# Patient Record
Sex: Male | Born: 1950
Health system: Southern US, Community
[De-identification: ages and names within clinical notes are randomized; demographics above are authoritative.]

## PROBLEM LIST (undated history)

## (undated) DIAGNOSIS — I5033 Acute on chronic diastolic (congestive) heart failure: Secondary | ICD-10-CM

## (undated) DIAGNOSIS — N189 Chronic kidney disease, unspecified: Secondary | ICD-10-CM

## (undated) DIAGNOSIS — G4730 Sleep apnea, unspecified: Secondary | ICD-10-CM

## (undated) DIAGNOSIS — Z1379 Encounter for other screening for genetic and chromosomal anomalies: Principal | ICD-10-CM

## (undated) DIAGNOSIS — I82409 Acute embolism and thrombosis of unspecified deep veins of unspecified lower extremity: Secondary | ICD-10-CM

## (undated) DIAGNOSIS — C801 Malignant (primary) neoplasm, unspecified: Secondary | ICD-10-CM

## (undated) DIAGNOSIS — I2699 Other pulmonary embolism without acute cor pulmonale: Secondary | ICD-10-CM

## (undated) DIAGNOSIS — M1990 Unspecified osteoarthritis, unspecified site: Secondary | ICD-10-CM

## (undated) DIAGNOSIS — E1140 Type 2 diabetes mellitus with diabetic neuropathy, unspecified: Secondary | ICD-10-CM

## (undated) DIAGNOSIS — J189 Pneumonia, unspecified organism: Secondary | ICD-10-CM

## (undated) DIAGNOSIS — I10 Essential (primary) hypertension: Secondary | ICD-10-CM

## (undated) DIAGNOSIS — K219 Gastro-esophageal reflux disease without esophagitis: Secondary | ICD-10-CM

## (undated) HISTORY — PX: HERNIA REPAIR: SHX51

## (undated) HISTORY — PX: OTHER SURGICAL HISTORY: SHX169

## (undated) HISTORY — DX: Malignant (primary) neoplasm, unspecified: C80.1

## (undated) HISTORY — PX: KNEE SURGERY: SHX244

## (undated) HISTORY — PX: COLONOSCOPY: SHX174

## (undated) HISTORY — DX: Encounter for other screening for genetic and chromosomal anomalies: Z13.79

## (undated) HISTORY — DX: Chronic kidney disease, unspecified: N18.9

## (undated) HISTORY — PX: SHOULDER SURGERY: SHX246

## (undated) HISTORY — PX: PROSTATE SURGERY: SHX751

## (undated) HISTORY — PX: MULTIPLE TOOTH EXTRACTIONS: SHX2053

## (undated) HISTORY — PX: RADIOACTIVE SEED IMPLANT: SHX5150

---

## 2000-01-06 ENCOUNTER — Encounter: Payer: Self-pay | Admitting: Emergency Medicine

## 2000-01-06 ENCOUNTER — Inpatient Hospital Stay (HOSPITAL_COMMUNITY): Admission: EM | Admit: 2000-01-06 | Discharge: 2000-01-07 | Payer: Self-pay | Admitting: Emergency Medicine

## 2000-01-07 ENCOUNTER — Encounter: Payer: Self-pay | Admitting: Internal Medicine

## 2003-06-06 ENCOUNTER — Encounter: Admission: RE | Admit: 2003-06-06 | Discharge: 2003-06-06 | Payer: Self-pay | Admitting: Internal Medicine

## 2003-09-20 ENCOUNTER — Encounter (INDEPENDENT_AMBULATORY_CARE_PROVIDER_SITE_OTHER): Payer: Self-pay | Admitting: Specialist

## 2003-09-20 ENCOUNTER — Ambulatory Visit (HOSPITAL_COMMUNITY): Admission: RE | Admit: 2003-09-20 | Discharge: 2003-09-20 | Payer: Self-pay | Admitting: Gastroenterology

## 2003-12-21 ENCOUNTER — Encounter: Admission: RE | Admit: 2003-12-21 | Discharge: 2003-12-21 | Payer: Self-pay | Admitting: Internal Medicine

## 2005-07-17 ENCOUNTER — Encounter: Admission: RE | Admit: 2005-07-17 | Discharge: 2005-07-17 | Payer: Self-pay | Admitting: Internal Medicine

## 2005-10-27 ENCOUNTER — Emergency Department (HOSPITAL_COMMUNITY): Admission: EM | Admit: 2005-10-27 | Discharge: 2005-10-27 | Payer: Self-pay | Admitting: Emergency Medicine

## 2005-11-06 ENCOUNTER — Ambulatory Visit: Admission: RE | Admit: 2005-11-06 | Discharge: 2006-01-26 | Payer: Self-pay | Admitting: Radiation Oncology

## 2006-08-19 ENCOUNTER — Encounter: Admission: RE | Admit: 2006-08-19 | Discharge: 2006-08-19 | Payer: Self-pay | Admitting: Internal Medicine

## 2006-09-30 ENCOUNTER — Inpatient Hospital Stay (HOSPITAL_BASED_OUTPATIENT_CLINIC_OR_DEPARTMENT_OTHER): Admission: RE | Admit: 2006-09-30 | Discharge: 2006-09-30 | Payer: Self-pay | Admitting: Interventional Cardiology

## 2007-12-02 ENCOUNTER — Ambulatory Visit: Payer: Self-pay | Admitting: Hematology & Oncology

## 2008-01-13 LAB — COMPREHENSIVE METABOLIC PANEL
ALT: 15 U/L (ref 0–53)
AST: 10 U/L (ref 0–37)
Albumin: 3.9 g/dL (ref 3.5–5.2)
Alkaline Phosphatase: 88 U/L (ref 39–117)
BUN: 13 mg/dL (ref 6–23)
CO2: 27 mEq/L (ref 19–32)
Calcium: 9.1 mg/dL (ref 8.4–10.5)
Chloride: 101 mEq/L (ref 96–112)
Creatinine, Ser: 0.87 mg/dL (ref 0.40–1.50)
Glucose, Bld: 291 mg/dL — ABNORMAL HIGH (ref 70–99)
Potassium: 4.2 mEq/L (ref 3.5–5.3)
Sodium: 138 mEq/L (ref 135–145)
Total Bilirubin: 0.4 mg/dL (ref 0.3–1.2)
Total Protein: 7.3 g/dL (ref 6.0–8.3)

## 2008-01-13 LAB — CBC WITH DIFFERENTIAL (CANCER CENTER ONLY)
BASO#: 0.1 10*3/uL (ref 0.0–0.2)
BASO%: 0.7 % (ref 0.0–2.0)
EOS%: 1.2 % (ref 0.0–7.0)
Eosinophils Absolute: 0.1 10*3/uL (ref 0.0–0.5)
HCT: 40 % (ref 38.7–49.9)
HGB: 13.4 g/dL (ref 13.0–17.1)
LYMPH#: 2.3 10*3/uL (ref 0.9–3.3)
LYMPH%: 26.9 % (ref 14.0–48.0)
MCH: 27.9 pg — ABNORMAL LOW (ref 28.0–33.4)
MCHC: 33.5 g/dL (ref 32.0–35.9)
MCV: 83 fL (ref 82–98)
MONO#: 0.4 10*3/uL (ref 0.1–0.9)
MONO%: 5.2 % (ref 0.0–13.0)
NEUT#: 5.6 10*3/uL (ref 1.5–6.5)
NEUT%: 66 % (ref 40.0–80.0)
Platelets: 252 10*3/uL (ref 145–400)
RBC: 4.8 10*6/uL (ref 4.20–5.70)
RDW: 12.1 % (ref 10.5–14.6)
WBC: 8.4 10*3/uL (ref 4.0–10.0)

## 2008-01-13 LAB — PSA: PSA: 8.22 ng/mL — ABNORMAL HIGH (ref 0.10–4.00)

## 2008-01-13 LAB — LACTATE DEHYDROGENASE: LDH: 155 U/L (ref 94–250)

## 2008-01-19 ENCOUNTER — Ambulatory Visit (HOSPITAL_COMMUNITY): Admission: RE | Admit: 2008-01-19 | Discharge: 2008-01-19 | Payer: Self-pay | Admitting: Hematology & Oncology

## 2008-01-30 ENCOUNTER — Ambulatory Visit: Payer: Self-pay | Admitting: Hematology & Oncology

## 2008-02-06 ENCOUNTER — Ambulatory Visit: Admission: RE | Admit: 2008-02-06 | Discharge: 2008-02-16 | Payer: Self-pay | Admitting: Radiation Oncology

## 2008-02-17 ENCOUNTER — Ambulatory Visit: Admission: RE | Admit: 2008-02-17 | Discharge: 2008-05-17 | Payer: Self-pay | Admitting: Radiation Oncology

## 2008-02-17 DIAGNOSIS — C801 Malignant (primary) neoplasm, unspecified: Secondary | ICD-10-CM

## 2008-02-17 HISTORY — DX: Malignant (primary) neoplasm, unspecified: C80.1

## 2008-04-23 ENCOUNTER — Ambulatory Visit (HOSPITAL_COMMUNITY): Admission: RE | Admit: 2008-04-23 | Discharge: 2008-04-23 | Payer: Self-pay | Admitting: Urology

## 2008-05-29 ENCOUNTER — Ambulatory Visit: Admission: RE | Admit: 2008-05-29 | Discharge: 2008-07-10 | Payer: Self-pay | Admitting: Radiation Oncology

## 2008-08-22 ENCOUNTER — Ambulatory Visit: Payer: Self-pay | Admitting: Sports Medicine

## 2008-08-22 DIAGNOSIS — IMO0002 Reserved for concepts with insufficient information to code with codable children: Secondary | ICD-10-CM | POA: Insufficient documentation

## 2008-08-22 DIAGNOSIS — M1710 Unilateral primary osteoarthritis, unspecified knee: Secondary | ICD-10-CM

## 2008-08-22 DIAGNOSIS — M76899 Other specified enthesopathies of unspecified lower limb, excluding foot: Secondary | ICD-10-CM | POA: Insufficient documentation

## 2008-09-19 ENCOUNTER — Ambulatory Visit: Payer: Self-pay | Admitting: Sports Medicine

## 2008-09-19 DIAGNOSIS — E119 Type 2 diabetes mellitus without complications: Secondary | ICD-10-CM | POA: Insufficient documentation

## 2009-02-02 ENCOUNTER — Emergency Department (HOSPITAL_COMMUNITY): Admission: EM | Admit: 2009-02-02 | Discharge: 2009-02-02 | Payer: Self-pay | Admitting: Emergency Medicine

## 2009-07-01 ENCOUNTER — Emergency Department (HOSPITAL_COMMUNITY): Admission: EM | Admit: 2009-07-01 | Discharge: 2009-07-01 | Payer: Self-pay | Admitting: Emergency Medicine

## 2009-07-21 ENCOUNTER — Emergency Department (HOSPITAL_COMMUNITY): Admission: EM | Admit: 2009-07-21 | Discharge: 2009-07-21 | Payer: Self-pay | Admitting: Emergency Medicine

## 2009-10-15 ENCOUNTER — Ambulatory Visit: Payer: Self-pay | Admitting: Sports Medicine

## 2009-10-15 ENCOUNTER — Ambulatory Visit (HOSPITAL_COMMUNITY)
Admission: RE | Admit: 2009-10-15 | Discharge: 2009-10-15 | Payer: Self-pay | Source: Home / Self Care | Admitting: Family Medicine

## 2009-10-15 ENCOUNTER — Encounter: Payer: Self-pay | Admitting: Family Medicine

## 2009-10-15 DIAGNOSIS — M25569 Pain in unspecified knee: Secondary | ICD-10-CM | POA: Insufficient documentation

## 2009-10-18 ENCOUNTER — Telehealth: Payer: Self-pay | Admitting: Family Medicine

## 2009-12-06 ENCOUNTER — Emergency Department (HOSPITAL_COMMUNITY): Admission: EM | Admit: 2009-12-06 | Discharge: 2009-12-06 | Payer: Self-pay | Admitting: Emergency Medicine

## 2010-01-28 ENCOUNTER — Encounter: Payer: Self-pay | Admitting: Sports Medicine

## 2010-02-28 ENCOUNTER — Encounter: Payer: Self-pay | Admitting: Family Medicine

## 2010-03-20 NOTE — Progress Notes (Signed)
Summary: Phone note - knee x-ray review  Phone Note Outgoing Call   Call placed by: Darene Lamer, DO Call placed to: Patient Reason for Call: Discuss lab or test results Summary of Call: Spoke to pt on phone 10/18/09 @ 17:00.  Called pt with recent knee x-ray results.  Pt has advanced b/l tri-compartmental DJD.  He continues to have significant pain.  Again discussed option of knee injections, but feel it may be worthwhile for him to f/u with his orthopedist regarding possibility of knee replacement.  He has not done well with oral therapy at this point either.  Pt plans to f/u with ortho for their input.  He is more than welcome to come to [place] for injections if he needed.  Encouraged to call with any questions or concerns.  Pt expressed understanding & agreement with above.

## 2010-03-20 NOTE — Assessment & Plan Note (Signed)
Summary: FU KNEE/JW   Vital Signs:  Patient profile:   60 year old male BP sitting:   144 / 80  Vitals Entered By: Lillia Pauls CMA (September 19, 2008 3:57 PM)  History of Present Illness: reports to f/u chronic left knee pain 2/2 severe DJD. Received corticosteroid injection during last office visit. Using unloader brace for ambulation. Avg pain level decreased from 9/10 to 7/10. Some intermittent left knee buckling. Persistent left knee swelling. No catching or locking of the knee.   gave way yesterday going up steps. swollen more since then.  Physical Exam  General:  significant obesity; ,in no acute distress; alert,appropriate and cooperative throughout examination Msk:  Hips: 5/5 strength on flexion/abd/ER on the hips.  Knees: Genu Valgum (L>R). 100 degrees left flexion. 130 degrees right flexion. -10 degrees left extension. -5 degrees right extension. 5/5 strength throughout ROM testing. Mild inferior knee swelling, most notable over the pes anserine bursa. Negative McMurray's. No increased ligament laxity.  Feet: Excessive pronation (L>R). Toe splaying on the left.  *Significantly decreased limp with insertion of left insole with medial post and scaphoid arch support.   Impression & Recommendations:  Problem # 1:  DEGENERATIVE JOINT DISEASE, BOTH KNEES, SEVERE (ICD-715.96)  - Comforthotics with medial post and scaphoid arch support provided for use in the left shoe. - Continue to use unloader brace for ambulatory activities. - Tramadol prescribed. - RTC in 2 months or sooner as needed.  His updated medication list for this problem includes:    Aspirin 81 Mg Chew (Aspirin) .Marland Kitchen... 1 by mouth qd    Tramadol Hcl 50 Mg Tabs (Tramadol hcl) .Marland Kitchen... 1 by mouth qid  Orders: Sports Insoles (L3510)  Problem # 2:  BURSITIS, LEFT KNEE (ICD-726.60)  - Per item #1  injection not that helpful prob 2/2 degree of DJD and obesity  Orders: Sports Insoles (Z6109)  Problem # 3:   DIABETES MELLITUS, TYPE II (ICD-250.00)  His updated medication list for this problem includes:    Metformin Hcl 500 Mg Tabs (Metformin hcl) .Marland Kitchen... 1 by mouth bid    Lisinopril 20 Mg Tabs (Lisinopril) .Marland Kitchen... 1 by mouth qd    Aspirin 81 Mg Chew (Aspirin) .Marland Kitchen... 1 by mouth once daily  I think he will need chronic DJD medicines However, would prefer using tramadol to NSAIDs with his hx of DM  Orders: Sports Insoles (L3510)  Complete Medication List: 1)  Metformin Hcl 500 Mg Tabs (Metformin hcl) .Marland Kitchen.. 1 by mouth bid 2)  Lisinopril 20 Mg Tabs (Lisinopril) .Marland Kitchen.. 1 by mouth qd 3)  Aspirin 81 Mg Chew (Aspirin) .Marland Kitchen.. 1 by mouth qd 4)  Tramadol Hcl 50 Mg Tabs (Tramadol hcl) .Marland Kitchen.. 1 by mouth qid Prescriptions: TRAMADOL HCL 50 MG TABS (TRAMADOL HCL) 1 by mouth qid  #120 x 3   Entered by:   Lillia Pauls CMA   Authorized by:   Enid Baas MD   Signed by:   Lillia Pauls CMA on 09/19/2008   Method used:   Electronically to        [facility]* (retail)       [id number]       [address] Shipping/mailing       [place], [place]  [address]       Ph: [phone]       Fax: [phone]   RxID:   [id number]

## 2010-03-20 NOTE — Assessment & Plan Note (Signed)
Summary: BILATERAL KNEE PAIN,MC   History of Present Illness: 60yo male to office with c/o b/l knee pain L>R x 3-years. Pt with known DJD of knees & reportedly told by orthopedist several years ago should consider knee replacement. Experiencing increased pain in knees x several months. Pain worse with standing for long periods of time. Occasional swelling.  (+)clicking, no locking or catching. Denies recent trauma or injury. Using unloader brace periodically with some improvement. Hx of steroid injections - last over 1-yr ago.  Never discussed visco-supplementation. Taking tramadol 3-4 times daily with some small improvement in symptoms. No previous x-rays available for review.  PMH: Prostate CA with radioactive seed implant, needs to self cath due to complications; DM-2 with neuropathy PSH: Lt ACL repair - 1982, R knee arthrscopy - around 1982 w/ signs of arthritis.  Past History:  Past Medical History: Prostate CA with radioactive seed implant - needs to self cath secondary to complications DM-2 with peripheral neuropathy  Past Surgical History: Left ACL repair 1982 Right knee arthroscopy - around 1982, showed signs of arthritis  Review of Systems      See HPI General:  Denies chills, fatigue, fever, sleep disorder, and weakness. GU:  needs to self cath. MS:  See HPI. Derm:  Denies changes in color of skin, changes in nail beds, flushing, itching, lesion(s), poor wound healing, and rash. Neuro:  Complains of numbness and tingling; denies brief paralysis, falling down, poor balance, tremors, and weakness.  Physical Exam  General:  Morbidly obese, in no acute distress; alert,appropriate and cooperative throughout examination Msk:  KNEES:  noted to have genu valgum (L>R). - L knee: ROM -5-10 degrees extension, 110 degrees flexion. 2+ crepitus.  Mild amount of inferior-lateral swelling, no effusion.  (+)TTP over medial & lateral joint lines.  No ligamentous laxity, neg  McMurray. - R knee: ROM -5 degrees extension, 120 flexion with 2+ crepitus.  No swelling or effusion. (+) TTP along medial & lateral joint lines.  No ligamentous laxity, neg McMurray.  Feet: Excessive pronation (L>R). Toe splaying on the left. Pulses:  +1/4 DP & PT b/l Neurologic:  sensation intact to light touch.     Impression & Recommendations:  Problem # 1:  DEGENERATIVE JOINT DISEASE, BOTH KNEES, SEVERE (ICD-715.96) - Hx of severe knee DJD, now with increasing symptoms.  No images available for review, but previous notes indicate severe arthritis. - Discussed various tx options, including further oral medications, steroid injections, viscosupplementation, total knee replacement.  Pt not interested in injections at this time & would like to try other options. - Will check b/l knee x-rays to evaluate degree of arthritis.  Will contact with results at [phone]. - Recommended use of topical Capsaicin 3-4 times daily as needed. - Reviewed knee exercises. - Cont. to use unloader brace & keep sports insoles in shoes. - will discuss f/u with pt after receiving x-ray results.  His updated medication list for this problem includes:    Aspirin 81 Mg Chew (Aspirin) .Marland Kitchen... 1 by mouth qd    Tramadol Hcl 50 Mg Tabs (Tramadol hcl) .Marland Kitchen... 1 by mouth qid  Problem # 2:  KNEE PAIN, BILATERAL (ICD-719.46) - Secondary to severe DJD - will likely need knee replacement  His updated medication list for this problem includes:    Aspirin 81 Mg Chew (Aspirin) .Marland Kitchen... 1 by mouth qd    Tramadol Hcl 50 Mg Tabs (Tramadol hcl) .Marland Kitchen... 1 by mouth qid  Problem # 3:  MORBID OBESITY (ICD-278.01) - Contributing  to knee pain.  Would benefit from weight loss  Complete Medication List: 1)  Metformin Hcl 500 Mg Tabs (Metformin hcl) .Marland Kitchen.. 1 by mouth bid 2)  Lisinopril 20 Mg Tabs (Lisinopril) .Marland Kitchen.. 1 by mouth qd 3)  Aspirin 81 Mg Chew (Aspirin) .Marland Kitchen.. 1 by mouth qd 4)  Tramadol Hcl 50 Mg Tabs (Tramadol hcl) .Marland Kitchen.. 1 by mouth  qid  Other Orders: Radiology other (Radiology Other)

## 2010-03-20 NOTE — Assessment & Plan Note (Signed)
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; open reconstruction and patellar tendon replacement His L-knee has held up nicely since the surgery  Hit his knee the corner of a desk, just lateral to the tibial tubercle, approximately 1 month ago Swelling and discomfort in knee since this time Immediate swelling; no locking, catching, or clicking  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  was riding bike whcih helped knee flexibility  recently not doing much 2/2 knee pain  Dr Chaney Malling had advised him that knee is ready for TKR  Physical Exam  General:  morbidly obese NAD pleasant Msk:  L-knne, Scar runs along medial aspect of knee from suprapatellar region to below the L-tibial tubercle 2nd surgical scar over medial hamstring area 3rd scar on lateral joint line extending from lateral hamstring down iliotibial band almost to the tibial tubercle   On exam L-knee is warm compared to R-knee Swelling at the infrapatellar bursa on the L-knee Fluid in the suprapatellar pouch of L-knee Also, swelling on L-knee lateral joint line Signs of chronic DJD  L-knee, Anterior Drawer is ok Seated McMurray's is ok Flexion contracture in L-knee of approximately 15 degrees  Lacks 30 degrees of full flexion on R-knee Lacks 60 degrees of full flexion on L-knee   Additional Exam:  quick scan of knee iwth [facility] [place] has markedly thickened quad tendon lots of DJD and old operative changes noted patellar tendon intact at distal insetion there is moderate increase bursal fluid at lateral area there is a pocket of bursal or pseudobursal swelling lat to pat tendon and just below  joint line   Impression & Recommendations:  Problem # 1:  BURSITIS, LEFT KNEE (ICD-726.60)  swelling is significant today   cleanse with alcohol Topical analgesic spray : Ethyl choride Joint left knee at infrapatellar bursa Approached in typical fashion with: lateral approach directed under patellar tendon Completed without difficulty Meds: 1cc kenalog 40 + 3 ccs marcaine Needle: 25 G and 1.5 inch Aftercare instructions and Red flags advised  rest x 3 days then use donjoy knee sleeve for compression reck 1 month  Orders: Joint Aspirate / Injection, Intermediate (20605) Knee Support Pat cutout (Z6109)  Problem # 2:  DEGENERATIVE JOINT DISEASE, BOTH KNEES, SEVERE (ICD-715.96)  this is sever on left iwth lots of post op DJD also has at least 15 deg flexion contracture this cause relative short leg and trendelenburg on gait given heel lift on left and this improves gait somewhat  try to resume biking obvious candidate for TKR but depens on sxs which were not bad prior to this injury  Orders: Joint Aspirate / Injection, Intermediate ([address]) Knee Support Pat cutout (U9811)  Problem # 3:  MORBID OBESITY (ICD-278.01) try to resume exercise program when knee improving

## 2010-03-20 NOTE — Letter (Signed)
Summary: Disability determination services  Disability determination services   Imported By: Marily Memos 01/30/2010 15:13:52  _____________________________________________________________________  External Attachment:    Type:   Image     Comment:   External Document

## 2010-03-20 NOTE — Letter (Signed)
Summary: Disability determination services  Disability determination services   Imported By: Marily Memos 03/05/2010 10:44:05  _____________________________________________________________________  External Attachment:    Type:   Image     Comment:   External Document

## 2010-04-30 LAB — COMPREHENSIVE METABOLIC PANEL
ALT: 32 U/L (ref 0–53)
AST: 24 U/L (ref 0–37)
Albumin: 3.3 g/dL — ABNORMAL LOW (ref 3.5–5.2)
Alkaline Phosphatase: 70 U/L (ref 39–117)
BUN: 10 mg/dL (ref 6–23)
CO2: 25 mEq/L (ref 19–32)
Calcium: 8.2 mg/dL — ABNORMAL LOW (ref 8.4–10.5)
Chloride: 111 mEq/L (ref 96–112)
Creatinine, Ser: 1.15 mg/dL (ref 0.4–1.5)
GFR calc Af Amer: >60 mL/min (ref >60)
GFR calc non Af Amer: >60 mL/min (ref >60)
Glucose, Bld: 232 mg/dL — ABNORMAL HIGH (ref 70–99)
Potassium: 3.2 mEq/L — ABNORMAL LOW (ref 3.5–5.1)
Sodium: 142 mEq/L (ref 135–145)
Total Bilirubin: 1 mg/dL (ref 0.3–1.2)
Total Protein: 7.2 g/dL (ref 6.0–8.3)

## 2010-04-30 LAB — CBC
HCT: 34.2 % — ABNORMAL LOW (ref 39.0–52.0)
Hemoglobin: 10.8 g/dL — ABNORMAL LOW (ref 13.0–17.0)
MCH: 26.5 pg (ref 26.0–34.0)
MCHC: 31.6 g/dL (ref 30.0–36.0)
MCV: 84 fL (ref 78.0–100.0)
Platelets: 290 10*3/uL (ref 150–400)
RBC: 4.07 MIL/uL — ABNORMAL LOW (ref 4.22–5.81)
RDW: 14.5 % (ref 11.5–15.5)
WBC: 8 10*3/uL (ref 4.0–10.5)

## 2010-04-30 LAB — DIFFERENTIAL
Basophils Absolute: 0 10*3/uL (ref 0.0–0.1)
Basophils Relative: 0 % (ref 0–1)
Eosinophils Absolute: 0 10*3/uL (ref 0.0–0.7)
Eosinophils Relative: 0 % (ref 0–5)
Lymphocytes Relative: 16 % (ref 12–46)
Lymphs Abs: 1.3 10*3/uL (ref 0.7–4.0)
Monocytes Absolute: 0.4 10*3/uL (ref 0.1–1.0)
Monocytes Relative: 6 % (ref 3–12)
Neutro Abs: 6.2 10*3/uL (ref 1.7–7.7)
Neutrophils Relative %: 78 % — ABNORMAL HIGH (ref 43–77)

## 2010-04-30 LAB — CK TOTAL AND CKMB (NOT AT ARMC)
CK, MB: 0.9 ng/mL (ref 0.3–4.0)
Relative Index: 0.8 (ref 0.0–2.5)
Total CK: 107 U/L (ref 7–232)

## 2010-04-30 LAB — TROPONIN I: Troponin I: 0.01 ng/mL (ref 0.00–0.06)

## 2010-04-30 LAB — BRAIN NATRIURETIC PEPTIDE: Pro B Natriuretic peptide (BNP): 362 pg/mL — ABNORMAL HIGH (ref 0.0–100.0)

## 2010-04-30 LAB — D-DIMER, QUANTITATIVE: D-Dimer, Quant: 2.33 ug/mL-FEU — ABNORMAL HIGH (ref 0.00–0.48)

## 2010-05-05 LAB — URINALYSIS, ROUTINE W REFLEX MICROSCOPIC
Bilirubin Urine: NEGATIVE
Glucose, UA: NEGATIVE mg/dL
Ketones, ur: NEGATIVE mg/dL
Nitrite: NEGATIVE
Protein, ur: NEGATIVE mg/dL
Specific Gravity, Urine: 1.015 (ref 1.005–1.030)
Urobilinogen, UA: 0.2 mg/dL (ref 0.0–1.0)
pH: 5 (ref 5.0–8.0)

## 2010-05-05 LAB — URINE CULTURE
Colony Count: 5000
Colony Count: >=100000

## 2010-05-05 LAB — URINE MICROSCOPIC-ADD ON

## 2010-05-19 LAB — POCT URINALYSIS DIP (DEVICE)
Bilirubin Urine: NEGATIVE
Glucose, UA: >=1000 mg/dL — AB
Nitrite: NEGATIVE
Protein, ur: >=300 mg/dL — AB
Specific Gravity, Urine: 1.015 (ref 1.005–1.030)
Urobilinogen, UA: 0.2 mg/dL (ref 0.0–1.0)
pH: 6 (ref 5.0–8.0)

## 2010-05-19 LAB — CBC
HCT: 37.6 % — ABNORMAL LOW (ref 39.0–52.0)
Hemoglobin: 12.5 g/dL — ABNORMAL LOW (ref 13.0–17.0)
MCHC: 33.3 g/dL (ref 30.0–36.0)
MCV: 85 fL (ref 78.0–100.0)
Platelets: 259 10*3/uL (ref 150–400)
RBC: 4.43 MIL/uL (ref 4.22–5.81)
RDW: 14.2 % (ref 11.5–15.5)
WBC: 11.4 10*3/uL — ABNORMAL HIGH (ref 4.0–10.5)

## 2010-05-19 LAB — URINE CULTURE: Colony Count: 40000

## 2010-05-19 LAB — DIFFERENTIAL
Basophils Absolute: 0 10*3/uL (ref 0.0–0.1)
Basophils Relative: 0 % (ref 0–1)
Eosinophils Absolute: 0.1 10*3/uL (ref 0.0–0.7)
Eosinophils Relative: 1 % (ref 0–5)
Lymphocytes Relative: 14 % (ref 12–46)
Lymphs Abs: 1.6 10*3/uL (ref 0.7–4.0)
Monocytes Absolute: 0.6 10*3/uL (ref 0.1–1.0)
Monocytes Relative: 6 % (ref 3–12)
Neutro Abs: 9 10*3/uL — ABNORMAL HIGH (ref 1.7–7.7)
Neutrophils Relative %: 80 % — ABNORMAL HIGH (ref 43–77)

## 2010-05-29 LAB — BASIC METABOLIC PANEL
BUN: 7 mg/dL (ref 6–23)
CO2: 32 mEq/L (ref 19–32)
Calcium: 8.7 mg/dL (ref 8.4–10.5)
Chloride: 98 mEq/L (ref 96–112)
Creatinine, Ser: 1.09 mg/dL (ref 0.4–1.5)
GFR calc Af Amer: >60 mL/min (ref >60)
GFR calc non Af Amer: >60 mL/min (ref >60)
Glucose, Bld: 228 mg/dL — ABNORMAL HIGH (ref 70–99)
Potassium: 3.9 mEq/L (ref 3.5–5.1)
Sodium: 139 mEq/L (ref 135–145)

## 2010-05-29 LAB — GLUCOSE, CAPILLARY
Glucose-Capillary: 222 mg/dL — ABNORMAL HIGH (ref 70–99)
Glucose-Capillary: 265 mg/dL — ABNORMAL HIGH (ref 70–99)
Glucose-Capillary: 310 mg/dL — ABNORMAL HIGH (ref 70–99)

## 2010-05-29 LAB — HEMOGLOBIN AND HEMATOCRIT, BLOOD
HCT: 40.2 % (ref 39.0–52.0)
Hemoglobin: 13.2 g/dL (ref 13.0–17.0)

## 2010-07-01 NOTE — Cardiovascular Report (Signed)
Thomas [name], Thomas [name] NO.:  [id number]   MEDICAL RECORD NO.:  [id number]          PATIENT TYPE:  OIB   LOCATION:  1961                         FACILITY:  [facility]   PHYSICIAN:  Corky Crafts, MDDATE OF BIRTH:  1950/12/14   DATE OF PROCEDURE:  09/30/2006  DATE OF DISCHARGE:                            CARDIAC CATHETERIZATION   PRIMARY OPERATOR:  Jake Bathe, MD and. Corky Crafts, MD   INDICATIONS:  Abnormal LV function by nuclear study and persistent chest  pain.  The patient will be undergoing surgery.   PROCEDURE PERFORMED:  1. Left heart catheterization.  2. Left ventriculogram/  3. Coronary angiogram.  4. Abdominal aortogram.   PROCEDURE NARRATIVE:  The risks and benefits of cardiac catheterization  were explained to the patient and informed consent was obtained.  The  patient was brought to the cath lab.  He was prepped and draped in the  usual sterile fashion.  His right groin was infiltrated with 1%  lidocaine.  A 4-French arterial sheath was placed into his right femoral  artery using the modified Seldinger technique.  Left coronary artery  angiography was performed using a JL-4 catheter.  The catheter was  advanced to the vessel ostium under fluoroscopic guidance.  Digital  angiography was performed in multiple projections using hand injection  of contrast.  Right coronary artery angiography was performed using a 3D  RC catheter.  The catheter was placed in the vessel ostium under  fluoroscopic guidance.  Digital angiography was performed using hand  injection of contrast.  A pigtail catheter was advanced to the ascending  aorta and across the aortic valve under fluoroscopic guidance.  Digital  angiography was performed in the RAO position with a power injection of  contrast to image the left ventricle.  The catheter was pulled back  under continuous hemodynamic pressure monitoring.  The catheter was then  pulled back to the abdominal  aorta and power injection of contrast was  performed in the AP projection.  The sheath was removed using manual  compression.   FINDINGS:  The left main was large vessel and widely patent.  The  circumflex was a large dominant vessel which was angiographically  normal.  There is an OM1 which is medium-sized with mild irregularities.  There is a second obtuse marginal which is a large vessel which is  angiographically normal.  The PDA did arise from the left circumflex  that supplied blood flow to the apex.  The left anterior descending was  a large vessel with minor luminal irregularities.  The first diagonal is  a large branching vessel with minor irregularities.  The right coronary  artery is a nondominant vessel and is widely patent.   The left ventriculogram shows normal left ventricular function with an  estimated ejection fraction of 55%.   The abdominal aortogram shows bilateral single renal arteries.  There  did not appear to be any hemodynamically significant stenoses although  the right renal artery is somewhat unclear.  There is no abdominal  aortic aneurysm.   HEMODYNAMIC RESULTS:  Left ventricular pressure 158/61, LVEDP of 22  mmHg.  Aortic pressure of 159/90 with a mean aortic pressure of 117  mmHg.   IMPRESSION:  1. No significant coronary artery disease.  2. Normal ventricular function with ejection fraction estimated to be      at 55%.  3. No renal artery stenosis.  4. Mildly elevated left ventricular end-diastolic pressure.   RECOMMENDATIONS:  The patient continue to with aggressive preventive  therapy.  He will be discharged home with follow-up with the office to  relieve his noncardiac pain.  No further cardiac workup is required  prior to his gastric bypass surgery.      Corky Crafts, MD  Electronically Signed     JSV/MEDQ  D:  09/30/2006  T:  10/01/2006  Job:  [id number]

## 2010-07-01 NOTE — Op Note (Signed)
[name], Thomas [name]          ACCOUNT NO.:  [id number]   MEDICAL RECORD NO.:  [id number]          PATIENT TYPE:  AMB   LOCATION:  DAY                          FACILITY:  [facility]   PHYSICIAN:  Lindaann Slough, M.D.  DATE OF BIRTH:  08-30-1950   DATE OF PROCEDURE:  04/23/2008  DATE OF DISCHARGE:                               OPERATIVE REPORT   PREOPERATIVE DIAGNOSIS:  Adenocarcinoma of prostate.   POSTOPERATIVE DIAGNOSIS:  Adenocarcinoma of prostate.   PROCEDURE:  I-125 seeds implantation and cystoscopy.   SURGEON:  Danae Chen, M.D. and Artist Pais. Kathrynn Running, M.D.   ANESTHESIA:  General.   INDICATIONS:  The patient is a 60 year old male with prostate cancer  diagnosed in the August 2007, Gleason score 6.  At that time his PSA was  5.37.  The patient is markedly obese and had several consultations at  [facility], and he is not a candidate for open procedure.  He finally decided  to proceed with seeds implantation.  He weighs about 362 pounds and he  was seen by Dr. Kathrynn Running and he is scheduled today for I-125 seeds  implantation.   DESCRIPTION OF PROCEDURE:  The patient was identified by his wrist band  and proper time-out was taken.   He was then placed in the dorsal lithotomy position.  A Foley catheter  was inserted in the bladder and rectal tube was placed in the rectum and  ultrasound planning was done by Dr. Kathrynn Running.  When the planning was  completed, with the Nucletron and then under ultrasound guidance a total  of 84 seeds were implanted in the prostate through 24 needles.  The  total apparent activity is 51.4080 mCi.  Then under fluoroscopy there  appears to be good seed distribution.  The Foley catheter that was  previously inserted in the bladder was then removed.  A flexible  cystoscope was passed in the bladder.  The anterior urethra is normal.  He has moderate prostatic hypertrophy.  There is no stone, seed or tumor  in the bladder.  The ureteral orifices are in normal  position and shape  with clear efflux.  The cystoscope was then removed.  A #16 Foley  catheter was then reinserted in the bladder.   The patient tolerated the procedure well and left the OR in satisfactory  condition to post anesthesia care unit.      Lindaann Slough, M.D.  Electronically Signed     MN/MEDQ  D:  04/23/2008  T:  04/24/2008  Job:  [id number]

## 2010-07-04 NOTE — Op Note (Signed)
NAME:  Thomas [name], Thomas [name]                    ACCOUNT NO.:  [id number]   MEDICAL RECORD NO.:  [id number]                   PATIENT TYPE:  AMB   LOCATION:  ENDO                                 FACILITY[facility]  [facility]   PHYSICIAN:  Danise Edge, M.D.                DATE OF BIRTH:  January 20, 1951   DATE OF PROCEDURE:  09/20/2003  DATE OF DISCHARGE:                                 OPERATIVE REPORT   PROCEDURE:  Screening colonoscopy.   REFERRING PHYSICIAN:  Georgann Housekeeper, M.D.   INDICATIONS FOR PROCEDURE:  Mr. Meredith Mells is a 60 year old male  born 31-Jan-1951.  Mr. Landess is scheduled to undergo his first screening  colonoscopy with polypectomy to prevent colon cancer.   ENDOSCOPIST:  Danise Edge, M.D.   PREMEDICATION:  Versed 7 mg, Demerol 50 mg.   PROCEDURE:  After obtaining informed consent, Mr. Denio was placed in the  left lateral decubitus position.  I administered intravenous Demerol and  intravenous Versed to achieve conscious sedation for the procedure.  The  patient's blood pressure, oxygen saturation, and cardiac rhythm were  monitored throughout the procedure and documented in the medical record.   Anal inspection and digital rectal exam was normal.  The prostate was  nonnodular.  The Olympus adjustable pediatric colonoscope was introduced  into the rectum and advanced to the cecum.  Colonic preparation for the exam  today was excellent.   RECTUM:  From the mid rectum, a 1 mm sessile polyp was removed with the cold  biopsy forceps.   SIGMOID COLON/DESCENDING COLON:  Normal.   SPLENIC FLEXURE:  Normal.   TRANSVERSE COLON:  Normal.   HEPATIC FLEXURE:  Normal.   ASCENDING COLON:  Normal.   CECUM AND ILEOCECAL VALVE:  Normal.   ASSESSMENT:  A diminutive polyp was removed from the mid rectum with the  cold biopsy forceps, otherwise normal screening proctocolonoscopy of the  cecum.   RECOMMENDATIONS:  If rectal polyp returns neoplastic pathologically,  Mr.  Quackenbush should undergo a repeat colonoscopy in five years.                                               Danise Edge, M.D.    MJ/MEDQ  D:  09/20/2003  T:  09/20/2003  Job:  [id number]   cc:   Georgann Housekeeper, M.D.  [address]  [place]  [place] [address]  Fax: [phone]

## 2010-07-04 NOTE — Consult Note (Signed)
[place]. [facility]  Patient:    Thomas [name], Thomas [name]                 MRN: [id number] Proc. Date: 01/06/00 Adm. Date:  [id number] Disc. Date: [id number] Attending:  Georgann Housekeeper CC:         Georgann Housekeeper, M.D.   Consultation Report  CHIEF COMPLAINT:  Chest pain.  HISTORY OF PRESENT ILLNESS:  This is a very pleasant 60 year old black male with a history of non-insulin dependent diabetes mellitus, diet controlled, borderline hyperlipidemia, borderline hypertension, and also with a family history of coronary disease but no prior history of coronary disease for himself, who presented to the emergency room with substernal chest pain.  [place]  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  There was gradual resolution of the chest pain over 3 minutes.  There was no associated symptoms of nausea, vomiting, shortness of breath, or diaphoresis.  There was no radiation of pain.  He arteriosclerotic heart disease a recurrent episode when standing outside a hotel and again as he was being checked into the emergency room. Currently he is pain free.  Initial EKG showed normal sinus rhythm with no ST-T wave abnormalities.  His 02 saturations were 92% on room air.  Initial CPK, MB and troponin is negative.  His cardiac risk factors include obesity, borderline diabetes, borderline dyslipidemia, borderline hypertension and a family history of coronary disease.  PAST MEDICAL HISTORY: 1. Significant for obesity.  He lost 60 pounds over the past 6 months on the    Atkins diet. 2. History of gastroesophageal reflux disease. 3. Left rotator cuff repair 2 years ago. 4. Left knee reconstructive surgery approximately 10 years ago. 5. Right inguinal hernia repair.  There is no history of cancer or thyroid disease or  asthma.  ALLERGIES:  No known drug allergies.  MEDICATIONS:  Chromium picolinate q. day. (I believe it is a diet pill).  SOCIAL HISTORY:  He is married for 30 years with 5 children and 8 grandchildren.  They are all alive and well.  He denies any tobacco or alcohol use.  He does have occasional caffeine.  FAMILY HISTORY:  HIs father died at 31, he was murdered.  His mother is alive and well at 60 with no coronary disease.  He has 1 brother who died at 27 of leukemia, one brother had a PTCA and also has cancer at age 8.  He has one sister with breast cancer at age 94.  The patient is one of 12 children.  REVIEW OF SYSTEMS:  He has episodic shortness of breath when eating sweets, he states.  He denies any syncope, no psychoses, no depression.  He used to have symptoms with GE reflux disease but this has improved since weight loss. Otherwise review of systems is negative.  PHYSICAL EXAMINATION:  VITAL SIGNS:  Blood pressure is 154/96 with a heart rate of 70.  O2 saturations 96% on room air.  HEENT:  Pupils, equal, round, reactive to light and accommodation. Extraocular movements are intact bilaterally.  There is no scleral icterus, there is full lid movement.  Oral mucosa is moist and pink.  LUNGS:  Clear to auscultation throughout except for a few wheezes in the bases.  CARDIAC:  Heart has regular rate and rhythm, no murmurs, rubs or gallops. Normal S1, S2.  Carotid upstrokes +2 bilaterally, no bruits.  ABDOMEN:  Soft, nontender, nondistended with active bowel sounds.  No hepatosplenomegaly.  EXTREMITIES:  No clubbing, cyanosis, or edema.  Plus 2 dorsalis pedis pulses bilaterally.  NEUROLOGIC:  Alert and oriented x 3.  LABORATORY DATA:  Sodium 140, potassium 3.9, chloride 108, bicarbonate 22, BUN 20, creatinine 1, glucose 103.  White blood cell count 6, hematocrit 37, hemoglobin 12.6 and platelet count 253.  CK is 217 with an MB of 1.5. Troponin 0.01.  His other CPKs  were 151, 158 and 217 with MBs of 0.9, 1.4, 1.5.  Troponins were all less than 0.01.  Chest x-ray shows mild cardiomegaly.  No CHF, no infiltrates.  EKG shows normal sinus rhythm, 59 beats per minute with no ST-T wave abnormalities.  ASSESSMENT AND PLAN: 1.  ATYPICAL CHEST PAIN WITH NORMAL EKG.  Although he has multiple cardiac     risk factors rule out myocardial infarction with serial enzymes.  If he     rules out for myocardial infarction then plan a stress cardiolite in the     morning.  Agree with continuing lovenox, aspirin and p.r.n. nitroglycerin     for now.  I will start a nitroglycerin drip if pain reoccurs. 2. HYPERTENSION:  Zestril was recently started. 3. DIABETES MELLITUS. 4. QUESTIONABLE LIPID STATUS. DD:  01/07/00 TD:  01/08/00 Job: [id number]

## 2010-07-04 NOTE — H&P (Signed)
[place]. [facility]  Patient:    KATO, WIECZOREK                 MRN: [id number] Adm. Date:  [id number] Attending:  Osvaldo Human                         History and Physical  CHIEF COMPLAINT: Left-sided chest pain.  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 He noted that this was before lifting any heavy suitcases and it felt like somebody punching him in the chest.  He had a little bit of shortness of breath.  He had no nausea or vomiting, no radiation of pain in the jaw or the arm.  He states this subsided a little bit after resting and then he came to the emergency room.  He had another brief episode which lasted for a few seconds.  He noted that in the left axilla area he was a little sore. Yesterday he was belching and had some acid reflux symptoms.  In the past he had some acid reflux and took some Pepcid, but not every day.  He does not have any symptoms currently.  He denies any cough or fever.  He has had no nausea or vomiting or diarrhea.  He has had no recent URI symptoms.  The patient in the emergency room was pain-free and was given some aspirin to chew.  He was also fasting today.  PAST MEDICAL HISTORY:  1. Obesity.  2. Mild gastroesophageal reflux disease.  4. Type 2 diabetes, diet controlled.  His last hemoglobin A1C obtained was     5.4.  5. Microalbuminemia.  6. Low back pain.  FAMILY HISTORY: Positive for a brother who recently had angioplasty. Otherwise, history of hypertension and diabetes in the family.  SOCIAL HISTORY: The patient does not smoke and does not drink alcohol.  He is married and has five children.  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  REVIEW OF SYSTEMS: As per HPI.  MEDICATIONS: None.  ALLERGIES: No known drug allergies.  PHYSICAL EXAMINATION:  VITAL SIGNS: Blood pressure 142/90, pulse 50-60 and regular, respirations 18. Oxygen saturation 96% on room air.  GENERAL: The patient is a well-appearing male, lying in bed comfortably and in no acute distress.  HEENT: Head normocephalic, atraumatic.  Oropharynx clear.  NECK: No JVD.  CHEST: Lungs clear to auscultation.  Chest wall revealed some left axillary tenderness on palpation on my examination.  CARDIAC: Normal S1 and S2, without any murmurs, rubs, or gallops.  ABDOMEN: Soft, obese.  Positive bowel sounds.  No tenderness.  EXTREMITIES: There was no pedal edema.  He has chronic nail changes, awaiting podiatry referral.  LABORATORY DATA: EKG showed normal sinus rhythm without any ST-T wave changes, normal axis.  Chest x-ray showed mild cardiomegaly without any CHF or infiltrates.  Laboratory data was significant for a total CK of 217 with CK-MB negative. Troponin was negative.  Chemistries were normal, as well as the CBC.  ASSESSMENT: Chest pain, with positive risk factors including obesity, type 2 diabetes that is borderline and diet controlled, and family history.  This is of atypical nature but  we will rule him out and admit him for telemetry.  Once ruled out we will get a stress test to further evaluate his coronaries.  As far as his atypical nature we will put him on some Naprosyn for his muscle pain and some Prevacid for his reflux.  He is borderline hypertensive and given diabetes I will put him on an ACE inhibitor.  I discussed this with Anselm Lis, N.P. for the cardiac stress test. DD:  01/06/00 TD:  01/06/00 Job: [id number]

## 2010-07-07 ENCOUNTER — Other Ambulatory Visit: Payer: Self-pay | Admitting: Emergency Medicine

## 2010-07-10 ENCOUNTER — Other Ambulatory Visit: Payer: Self-pay

## 2010-09-24 ENCOUNTER — Other Ambulatory Visit: Payer: Self-pay | Admitting: Sports Medicine

## 2011-04-14 ENCOUNTER — Other Ambulatory Visit: Payer: Self-pay | Admitting: Radiation Oncology

## 2011-10-15 ENCOUNTER — Emergency Department (INDEPENDENT_AMBULATORY_CARE_PROVIDER_SITE_OTHER): Payer: 59

## 2011-10-15 ENCOUNTER — Emergency Department (HOSPITAL_COMMUNITY)
Admission: EM | Admit: 2011-10-15 | Discharge: 2011-10-15 | Disposition: A | Discharge status: Home / Self Care | Payer: Self-pay | Source: Home / Self Care | Attending: Family Medicine | Admitting: Family Medicine

## 2011-10-15 ENCOUNTER — Encounter (HOSPITAL_COMMUNITY): Payer: Self-pay | Admitting: *Deleted

## 2011-10-15 DIAGNOSIS — J209 Acute bronchitis, unspecified: Secondary | ICD-10-CM

## 2011-10-15 HISTORY — DX: Essential (primary) hypertension: I10

## 2011-10-15 HISTORY — DX: Unspecified osteoarthritis, unspecified site: M19.90

## 2011-10-15 MED ORDER — AZITHROMYCIN 250 MG PO TABS: ORAL_TABLET | ORAL | Status: AC

## 2011-10-15 MED ORDER — HYDROCOD POLST-CHLORPHEN POLST 10-8 MG/5ML PO LQCR: 5.0000 mL | Freq: Two times a day (BID) | ORAL | Status: DC

## 2011-10-15 NOTE — ED Notes (Signed)
Pt  Reports  Symptoms  Of  Cough  /  congestestion        Body  Aches       X  6  Days   Pt  Ambulates    With a  Slow  Steady  Gait           Pt  Reports  The  Symptoms  Nit releived  By OTC  meds      He  Speaks   In  Complete  sentances  And  Is  Pleasant

## 2011-10-15 NOTE — ED Provider Notes (Signed)
History     CSN: [id number]  Arrival date & time 10/15/11  1551   First MD Initiated Contact with Patient 10/15/11 1554      Chief Complaint  Patient presents with  . Cough    (Consider location/radiation/quality/duration/timing/severity/associated sxs/prior treatment) Patient is a 61 y.o. male presenting with cough. The history is provided by the patient.  Cough This is a new problem. The current episode started more than 2 days ago. The problem has been gradually worsening. The cough is productive of sputum. There has been no fever. Associated symptoms include shortness of breath. Pertinent negatives include no chest pain, no chills, no rhinorrhea and no sore throat. He is not a smoker. His past medical history is significant for pneumonia.    Past Medical History  Diagnosis Date  . Diabetes mellitus   . Hypertension   . Arthritis     Past Surgical History  Procedure Date  . Knee surgery   . Shoulder surgery     No family history on file.  History  Substance Use Topics  . Smoking status: Not on file  . Smokeless tobacco: Not on file  . Alcohol Use:       Review of Systems  Constitutional: Negative for fever and chills.  HENT: Positive for congestion. Negative for sore throat and rhinorrhea.   Respiratory: Positive for cough and shortness of breath.   Cardiovascular: Negative for chest pain and palpitations.  Gastrointestinal: Negative.     Allergies  Review of patient's allergies indicates no known allergies.  Home Medications   Current Outpatient Rx  Name Route Sig Dispense Refill  . AMLODIPINE BESYLATE PO Oral Take by mouth.    . ASPIRIN 81 MG PO TABS Oral Take 81 mg by mouth daily.    Marland Kitchen METFORMIN HCL 1000 MG PO TABS Oral Take 1,000 mg by mouth 2 (two) times daily with a meal.    . AZITHROMYCIN 250 MG PO TABS  Take as directed on pack 6 each 0  . HYDROCOD POLST-CPM POLST ER 10-8 MG/5ML PO LQCR Oral Take 5 mLs by mouth every 12 (twelve) hours. 115 mL  1  . TAMSULOSIN HCL 0.4 MG PO CAPS  TAKE 1 CAPSULE BY MOUTH TWICE DAILY 60 capsule 5  . TRAMADOL HCL 50 MG PO TABS  TAKE 1 TABLET BY MOUTH 4 TIMES DAILY 120 tablet 3    BP 155/95  Pulse 100  Temp 99 F (37.2 C) (Oral)  Resp 21  SpO2 94%  Physical Exam  Nursing note and vitals reviewed. Constitutional: He is oriented to person, place, and time. He appears well-developed and well-nourished.  HENT:  Head: Normocephalic.  Right Ear: External ear normal.  Left Ear: External ear normal.  Mouth/Throat: Oropharynx is clear and moist.  Eyes: Pupils are equal, round, and reactive to light.  Neck: Normal range of motion. Neck supple.  Cardiovascular: Normal rate, regular rhythm, normal heart sounds and intact distal pulses.   Pulmonary/Chest: Effort normal and breath sounds normal. He has no rales.  Lymphadenopathy:    He has no cervical adenopathy.  Neurological: He is alert and oriented to person, place, and time.  Skin: Skin is warm and dry.  Psychiatric: He has a normal mood and affect.    ED Course  Procedures (including critical care time)  Labs Reviewed - No data to display Dg Chest 2 View  10/15/2011  *RADIOLOGY REPORT*  Clinical Data: Cough and shortness of breath  CHEST - 2 VIEW  Comparison:  CT chest 12/06/2009 and chest radiograph 12/06/2009  Findings: Heart size is upper normal.  Thoracic aorta and hilar contours are stable, with mild prominence of the central pulmonary arteries.  Pulmonary vascularity is within normal limits.  No focal airspace opacities, effusions, or pneumothorax.  IMPRESSION: No acute cardiopulmonary disease identified.   Original Report Authenticated By: Britta Mccreedy, M.D.      1. Bronchitis, acute       MDM  X-rays reviewed and report per radiologist.         Linna Hoff, MD 10/15/11 [phone]

## 2011-11-16 ENCOUNTER — Emergency Department (HOSPITAL_COMMUNITY)
Admission: EM | Admit: 2011-11-16 | Discharge: 2011-11-16 | Disposition: A | Discharge status: Home / Self Care | Payer: 59 | Source: Home / Self Care | Attending: Family Medicine | Admitting: Family Medicine

## 2011-11-16 ENCOUNTER — Encounter (HOSPITAL_COMMUNITY): Payer: Self-pay

## 2011-11-16 DIAGNOSIS — E119 Type 2 diabetes mellitus without complications: Secondary | ICD-10-CM

## 2011-11-16 DIAGNOSIS — J45909 Unspecified asthma, uncomplicated: Secondary | ICD-10-CM

## 2011-11-16 LAB — POCT I-STAT, CHEM 8
BUN: 10 mg/dL (ref 6–23)
Calcium, Ion: 1.03 mmol/L — ABNORMAL LOW (ref 1.13–1.30)
Chloride: 98 mEq/L (ref 96–112)
Creatinine, Ser: 1.2 mg/dL (ref 0.50–1.35)
Glucose, Bld: 328 mg/dL — ABNORMAL HIGH (ref 70–99)
HCT: 44 % (ref 39.0–52.0)
Hemoglobin: 15 g/dL (ref 13.0–17.0)
Potassium: 3.1 mEq/L — ABNORMAL LOW (ref 3.5–5.1)
Sodium: 139 mEq/L (ref 135–145)
TCO2: 26 mmol/L (ref 0–100)

## 2011-11-16 MED ORDER — LEVOFLOXACIN 500 MG PO TABS: 500.0000 mg | ORAL_TABLET | Freq: Every day | ORAL | Status: DC

## 2011-11-16 NOTE — ED Provider Notes (Signed)
History     CSN: [id number]  Arrival date & time 11/16/11  1221   First MD Initiated Contact with Patient 11/16/11 1258      Chief Complaint  Patient presents with  . URI    (Consider location/radiation/quality/duration/timing/severity/associated sxs/prior treatment) Patient is a 61 y.o. male presenting with URI. The history is provided by the patient.  URI The primary symptoms include cough. Primary symptoms do not include fever, wheezing or rash. The current episode started 3 to 5 days ago. This is a recurrent problem.  The illness is not associated with chills. Associated symptoms comments: Urinary freq, also yellow phlegm.. Risk factors for severe complications from URI include diabetes mellitus (s/p prostate ca,).    Past Medical History  Diagnosis Date  . Diabetes mellitus   . Hypertension   . Arthritis     Past Surgical History  Procedure Date  . Knee surgery   . Shoulder surgery     No family history on file.  History  Substance Use Topics  . Smoking status: Not on file  . Smokeless tobacco: Not on file  . Alcohol Use:       Review of Systems  Constitutional: Negative for fever and chills.  HENT: Positive for postnasal drip.   Respiratory: Positive for cough. Negative for shortness of breath and wheezing.   Gastrointestinal: Negative.   Genitourinary: Positive for urgency and frequency.  Skin: Negative for rash.    Allergies  Review of patient's allergies indicates no known allergies.  Home Medications   Current Outpatient Rx  Name Route Sig Dispense Refill  . AMLODIPINE BESYLATE PO Oral Take by mouth.    . ASPIRIN 81 MG PO TABS Oral Take 81 mg by mouth daily.    Marland Kitchen METFORMIN HCL 1000 MG PO TABS Oral Take 1,000 mg by mouth 2 (two) times daily with a meal.    . TAMSULOSIN HCL 0.4 MG PO CAPS  TAKE 1 CAPSULE BY MOUTH TWICE DAILY 60 capsule 5  . TRAMADOL HCL 50 MG PO TABS  TAKE 1 TABLET BY MOUTH 4 TIMES DAILY 120 tablet 3  . HYDROCOD POLST-CPM  POLST ER 10-8 MG/5ML PO LQCR Oral Take 5 mLs by mouth every 12 (twelve) hours. 115 mL 1  . LEVOFLOXACIN 500 MG PO TABS Oral Take 1 tablet (500 mg total) by mouth daily. 7 tablet 0    BP 161/77  Pulse 93  Temp 98.7 F (37.1 C) (Oral)  Resp 20  SpO2 94%  Physical Exam  Nursing note and vitals reviewed. Constitutional: He is oriented to person, place, and time. He appears well-developed and well-nourished.  HENT:  Right Ear: External ear normal.  Left Ear: External ear normal.  Mouth/Throat: Oropharynx is clear and moist.  Eyes: Conjunctivae normal are normal. Pupils are equal, round, and reactive to light.  Neck: Normal range of motion. Neck supple.  Cardiovascular: Normal rate, normal heart sounds and intact distal pulses.   Pulmonary/Chest: Effort normal and breath sounds normal.  Abdominal: Soft. Bowel sounds are normal. There is no tenderness.  Lymphadenopathy:    He has no cervical adenopathy.  Neurological: He is alert and oriented to person, place, and time.  Skin: Skin is warm and dry.    ED Course  Procedures (including critical care time)  Labs Reviewed  POCT I-STAT, CHEM 8 - Abnormal; Notable for the following:    Potassium 3.1 (*)     Glucose, Bld 328 (*)     Calcium, Ion 1.03 (*)  All other components within normal limits   No results found.   1. Bronchitis, allergic   2. Diabetes mellitus       MDM          Linna Hoff, MD 11/16/11 [phone]

## 2011-11-16 NOTE — ED Notes (Signed)
Patient complains of URI symptoms, cough and congestion, states cough sometimes productive and yellowish in color

## 2012-03-04 ENCOUNTER — Ambulatory Visit (INDEPENDENT_AMBULATORY_CARE_PROVIDER_SITE_OTHER): Payer: 59 | Admitting: Internal Medicine

## 2012-03-04 VITALS — BP 173/93 | HR 81 | Temp 98.3°F | Resp 16 | Ht 69.0 in | Wt 342.4 lb

## 2012-03-04 DIAGNOSIS — R300 Dysuria: Secondary | ICD-10-CM | POA: Diagnosis not present

## 2012-03-04 DIAGNOSIS — C61 Malignant neoplasm of prostate: Secondary | ICD-10-CM

## 2012-03-04 DIAGNOSIS — R350 Frequency of micturition: Secondary | ICD-10-CM | POA: Diagnosis not present

## 2012-03-04 LAB — POCT UA - MICROSCOPIC ONLY
Bacteria, U Microscopic: NEGATIVE
Casts, Ur, LPF, POC: NEGATIVE
Crystals, Ur, HPF, POC: NEGATIVE
Epithelial cells, urine per micros: NEGATIVE
Mucus, UA: NEGATIVE
WBC, Ur, HPF, POC: NEGATIVE
Yeast, UA: NEGATIVE

## 2012-03-04 LAB — POCT URINALYSIS DIPSTICK
Bilirubin, UA: NEGATIVE
Glucose, UA: 100
Ketones, UA: NEGATIVE
Nitrite, UA: NEGATIVE
Protein, UA: 30
Spec Grav, UA: 1.02
Urobilinogen, UA: 0.2
pH, UA: 6

## 2012-03-04 MED ORDER — CIPROFLOXACIN HCL 500 MG PO TABS: 500.0000 mg | ORAL_TABLET | Freq: Two times a day (BID) | ORAL | Status: DC

## 2012-03-04 MED ORDER — PHENAZOPYRIDINE HCL 200 MG PO TABS: 200.0000 mg | ORAL_TABLET | Freq: Three times a day (TID) | ORAL | Status: DC | PRN

## 2012-03-04 NOTE — Progress Notes (Signed)
  Subjective:    Patient ID: Thomas [name], male    DOB: 19-Oct-1950, 62 y.o.   MRN: [id number]  HPI Obese male with hx of prostate cancer with Seed tx. For about 1 week has had slowly progressive dysuria, frequency, does cath himself at home. Urologist is in [place]. Also has NIDDM, controlled with home glucoses running 120s. No fever, chills, abdom pain.   Review of Systems Complex see hx    Objective:   Physical Exam   Results for orders placed in visit on 03/04/12  POCT UA - MICROSCOPIC ONLY      Component Value Range   WBC, Ur, HPF, POC neg     RBC, urine, microscopic 1-3     Bacteria, U Microscopic neg     Mucus, UA neg     Epithelial cells, urine per micros neg     Crystals, Ur, HPF, POC neg     Casts, Ur, LPF, POC neg     Yeast, UA neg    POCT URINALYSIS DIPSTICK      Component Value Range   Color, UA yellow     Clarity, UA clear     Glucose, UA 100     Bilirubin, UA neg     Ketones, UA neg     Spec Grav, UA 1.020     Blood, UA trace     pH, UA 6.0     Protein, UA 30     Urobilinogen, UA 0.2     Nitrite, UA neg     Leukocytes, UA Trace     Cs urine     Assessment & Plan:  Dysuria cause unclear See urology next week Cipro/pyridium

## 2012-03-04 NOTE — Patient Instructions (Signed)
Urinary Tract Infection Urinary tract infections (UTIs) can develop anywhere along your urinary tract. Your urinary tract is your body's drainage system for removing wastes and extra water. Your urinary tract includes two kidneys, two ureters, a bladder, and a urethra. Your kidneys are a pair of bean-shaped organs. Each kidney is about the size of your fist. They are located below your ribs, one on each side of your spine. CAUSES Infections are caused by microbes, which are microscopic organisms, including fungi, viruses, and bacteria. These organisms are so small that they can only be seen through a microscope. Bacteria are the microbes that most commonly cause UTIs. SYMPTOMS  Symptoms of UTIs may vary by age and gender of the patient and by the location of the infection. Symptoms in young women typically include a frequent and intense urge to urinate and a painful, burning feeling in the bladder or urethra during urination. Older women and men are more likely to be tired, shaky, and weak and have muscle aches and abdominal pain. A fever may mean the infection is in your kidneys. Other symptoms of a kidney infection include pain in your back or sides below the ribs, nausea, and vomiting. DIAGNOSIS To diagnose a UTI, your caregiver will ask you about your symptoms. Your caregiver also will ask to provide a urine sample. The urine sample will be tested for bacteria and white blood cells. White blood cells are made by your body to help fight infection. TREATMENT  Typically, UTIs can be treated with medication. Because most UTIs are caused by a bacterial infection, they usually can be treated with the use of antibiotics. The choice of antibiotic and length of treatment depend on your symptoms and the type of bacteria causing your infection. HOME CARE INSTRUCTIONS  If you were prescribed antibiotics, take them exactly as your caregiver instructs you. Finish the medication even if you feel better after you  have only taken some of the medication.  Drink enough water and fluids to keep your urine clear or pale yellow.  Avoid caffeine, tea, and carbonated beverages. They tend to irritate your bladder.  Empty your bladder often. Avoid holding urine for long periods of time.  Empty your bladder before and after sexual intercourse.  After a bowel movement, women should cleanse from front to back. Use each tissue only once. SEEK MEDICAL CARE IF:   You have back pain.  You develop a fever.  Your symptoms do not begin to resolve within 3 days. SEEK IMMEDIATE MEDICAL CARE IF:   You have severe back pain or lower abdominal pain.  You develop chills.  You have nausea or vomiting.  You have continued burning or discomfort with urination. MAKE SURE YOU:   Understand these instructions.  Will watch your condition.  Will get help right away if you are not doing well or get worse. Document Released: 11/12/2004 Document Revised: 08/04/2011 Document Reviewed: 03/13/2011 [facility] Patient Information 2013 [place], [place]. Acute Urinary Retention, Male You have been seen by a caregiver today because of your inability to urinate (pass your water). This is a common problem in elderly males. As men age their prostates become larger and block the flow of urine from the bladder. This is usually a problem that has come on gradually. It is often first noticed by having to get up at night to urinate. This is because as the prostate enlarges it is more difficult to empty the bladder completely. Treatment may involve a one time catheterization to empty the bladder.  This is putting in a tube to drain your urine. Then you and your personal caregiver can decide at your earliest convenience how to handle this problem in the future. It may also be a problem that may not recur for years. Sometimes this problem can be caused by medications. In this case, all that is often necessary is to discontinue the offending  agent. If you are to leave the foley catheter (a long, narrow, hollow tube) in and go home with a drainage system, you will need to discuss the best course of action with your caregiver. While the catheter is in, maintain a good intake of fluids. Keep the drainage bag emptied and lower than your catheter. This is so contaminated (infected) urine will not be flowing back into your bladder. This could lead to a urinary tract infection. Only take over-the-counter or prescription medicines for pain, discomfort, or fever as directed by your caregiver.  SEEK IMMEDIATE MEDICAL CARE IF:  You develop chills, fever, or show signs of generalized illness that occurs prior to seeing your caregiver. Document Released: 05/11/2000 Document Revised: 04/27/2011 Document Reviewed: 01/25/2008 [facility] Patient Information 2013 [place], [place].

## 2012-03-06 LAB — URINE CULTURE
Colony Count: NO GROWTH
Organism ID, Bacteria: NO GROWTH

## 2012-03-07 DIAGNOSIS — R339 Retention of urine, unspecified: Secondary | ICD-10-CM | POA: Diagnosis not present

## 2012-03-07 DIAGNOSIS — C61 Malignant neoplasm of prostate: Secondary | ICD-10-CM | POA: Diagnosis not present

## 2012-03-07 DIAGNOSIS — R351 Nocturia: Secondary | ICD-10-CM | POA: Diagnosis not present

## 2012-04-13 DIAGNOSIS — C61 Malignant neoplasm of prostate: Secondary | ICD-10-CM | POA: Diagnosis not present

## 2012-04-13 DIAGNOSIS — N35919 Unspecified urethral stricture, male, unspecified site: Secondary | ICD-10-CM | POA: Diagnosis not present

## 2012-04-26 DIAGNOSIS — I10 Essential (primary) hypertension: Secondary | ICD-10-CM | POA: Diagnosis not present

## 2012-04-26 DIAGNOSIS — Z79899 Other long term (current) drug therapy: Secondary | ICD-10-CM | POA: Diagnosis not present

## 2012-04-26 DIAGNOSIS — R339 Retention of urine, unspecified: Secondary | ICD-10-CM | POA: Diagnosis not present

## 2012-04-26 DIAGNOSIS — G4733 Obstructive sleep apnea (adult) (pediatric): Secondary | ICD-10-CM | POA: Diagnosis not present

## 2012-04-26 DIAGNOSIS — Z8249 Family history of ischemic heart disease and other diseases of the circulatory system: Secondary | ICD-10-CM | POA: Diagnosis not present

## 2012-04-26 DIAGNOSIS — Z8546 Personal history of malignant neoplasm of prostate: Secondary | ICD-10-CM | POA: Diagnosis not present

## 2012-04-26 DIAGNOSIS — Z9889 Other specified postprocedural states: Secondary | ICD-10-CM | POA: Diagnosis not present

## 2012-04-26 DIAGNOSIS — M1710 Unilateral primary osteoarthritis, unspecified knee: Secondary | ICD-10-CM | POA: Diagnosis not present

## 2012-04-26 DIAGNOSIS — Z833 Family history of diabetes mellitus: Secondary | ICD-10-CM | POA: Diagnosis not present

## 2012-04-26 DIAGNOSIS — E119 Type 2 diabetes mellitus without complications: Secondary | ICD-10-CM | POA: Diagnosis not present

## 2012-04-26 DIAGNOSIS — N35919 Unspecified urethral stricture, male, unspecified site: Secondary | ICD-10-CM | POA: Diagnosis not present

## 2012-04-29 DIAGNOSIS — Z79899 Other long term (current) drug therapy: Secondary | ICD-10-CM | POA: Diagnosis not present

## 2012-04-29 DIAGNOSIS — C61 Malignant neoplasm of prostate: Secondary | ICD-10-CM | POA: Diagnosis not present

## 2012-04-29 DIAGNOSIS — I10 Essential (primary) hypertension: Secondary | ICD-10-CM | POA: Diagnosis not present

## 2012-04-29 DIAGNOSIS — R339 Retention of urine, unspecified: Secondary | ICD-10-CM | POA: Diagnosis not present

## 2012-04-29 DIAGNOSIS — N35919 Unspecified urethral stricture, male, unspecified site: Secondary | ICD-10-CM | POA: Diagnosis not present

## 2012-04-29 DIAGNOSIS — Z4789 Encounter for other orthopedic aftercare: Secondary | ICD-10-CM | POA: Diagnosis not present

## 2012-04-29 DIAGNOSIS — E119 Type 2 diabetes mellitus without complications: Secondary | ICD-10-CM | POA: Diagnosis not present

## 2012-04-29 DIAGNOSIS — G4733 Obstructive sleep apnea (adult) (pediatric): Secondary | ICD-10-CM | POA: Diagnosis not present

## 2012-05-01 DIAGNOSIS — R339 Retention of urine, unspecified: Secondary | ICD-10-CM | POA: Diagnosis not present

## 2012-05-01 DIAGNOSIS — Z79899 Other long term (current) drug therapy: Secondary | ICD-10-CM | POA: Diagnosis not present

## 2012-05-01 DIAGNOSIS — E1129 Type 2 diabetes mellitus with other diabetic kidney complication: Secondary | ICD-10-CM | POA: Diagnosis not present

## 2012-05-01 DIAGNOSIS — I10 Essential (primary) hypertension: Secondary | ICD-10-CM | POA: Diagnosis not present

## 2012-05-01 DIAGNOSIS — E1169 Type 2 diabetes mellitus with other specified complication: Secondary | ICD-10-CM | POA: Diagnosis not present

## 2012-05-01 DIAGNOSIS — D72829 Elevated white blood cell count, unspecified: Secondary | ICD-10-CM | POA: Diagnosis not present

## 2012-05-05 DIAGNOSIS — R339 Retention of urine, unspecified: Secondary | ICD-10-CM | POA: Diagnosis not present

## 2012-05-05 DIAGNOSIS — N35919 Unspecified urethral stricture, male, unspecified site: Secondary | ICD-10-CM | POA: Diagnosis not present

## 2012-05-05 DIAGNOSIS — C61 Malignant neoplasm of prostate: Secondary | ICD-10-CM | POA: Diagnosis not present

## 2012-05-10 DIAGNOSIS — R310 Gross hematuria: Secondary | ICD-10-CM | POA: Diagnosis not present

## 2012-05-25 ENCOUNTER — Ambulatory Visit (INDEPENDENT_AMBULATORY_CARE_PROVIDER_SITE_OTHER): Payer: 59 | Admitting: Internal Medicine

## 2012-05-25 ENCOUNTER — Encounter: Payer: Self-pay | Admitting: Internal Medicine

## 2012-05-25 VITALS — BP 188/101 | HR 82 | Temp 98.2°F | Resp 16 | Ht 70.5 in | Wt 345.0 lb

## 2012-05-25 DIAGNOSIS — M1710 Unilateral primary osteoarthritis, unspecified knee: Secondary | ICD-10-CM | POA: Diagnosis not present

## 2012-05-25 DIAGNOSIS — IMO0002 Reserved for concepts with insufficient information to code with codable children: Secondary | ICD-10-CM | POA: Diagnosis not present

## 2012-05-25 DIAGNOSIS — E119 Type 2 diabetes mellitus without complications: Secondary | ICD-10-CM

## 2012-05-25 DIAGNOSIS — I10 Essential (primary) hypertension: Secondary | ICD-10-CM

## 2012-05-25 NOTE — Patient Instructions (Signed)
Please sign medical release form for 3 provider office to review prior records today Have your blood work 1 week prior to next appointment

## 2012-05-25 NOTE — Assessment & Plan Note (Signed)
For 7 years. No known a1c. Continue metformin 1000 mg bid and glipizide

## 2012-05-25 NOTE — Progress Notes (Signed)
Subjective:    Patient ID: Thomas [name], male    DOB: 1950-11-30, 62 y.o.   MRN: [id number]  Chief Complaint  Patient presents with  . NP to Establish   HPI  62 y/o male patient here to establish care. He was being seen by [facility] practice until 6 months back and the new PCP for another 6 months. He also sees urology. Currently we have no prior records for review.  He has history of morbid obesity, DM, HTN, prostate cancer s/p tretament and osteoarthritis He has morbid obesity and wants to lose weight. He is not happy with prior care provided to him as they have not helped him lose weight. He skips meals to lose weight. He has no exercise. His knees bother him and this prevents him from exercising.  Has history of prostate cancer and is s/p seeding rx and follows with urology Dr Shelby Dubin  Review of Systems  Constitutional: Negative for fever, chills and appetite change.  HENT: Positive for rhinorrhea. Negative for ear pain, congestion and ear discharge.   Eyes: Negative for pain and visual disturbance.       Wears corrective lenses and follows with dr Erma Heritage  Respiratory: Negative for cough, shortness of breath and wheezing.   Cardiovascular: Negative for chest pain, palpitations and leg swelling.  Gastrointestinal: Positive for constipation. Negative for abdominal pain.       Eating more vegetable has been helpful. No blood in stool  Genitourinary: Positive for frequency. Negative for flank pain.       Self catheterizes for 3 years. History of prostate cancer s/p seed treatment Nocturia present Follows with dr Shelby Dubin urology  Musculoskeletal: Positive for arthralgias. Negative for joint swelling.  Skin: Negative for rash and wound.  Allergic/Immunologic: Positive for environmental allergies.  Neurological: Negative for dizziness, tremors, syncope, weakness, light-headedness and headaches.  Hematological: Negative for adenopathy.  Psychiatric/Behavioral: Negative for  suicidal ideas, behavioral problems, sleep disturbance, self-injury and agitation. The patient is nervous/anxious.        Objective:   Physical Exam  Constitutional: He is oriented to person, place, and time. No distress.  Morbidly obese  HENT:  Head: Normocephalic and atraumatic.  Mouth/Throat: Oropharynx is clear and moist.  Eyes: Pupils are equal, round, and reactive to light.  Neck: Normal range of motion. Neck supple. No JVD present. No tracheal deviation present. No thyromegaly present.  Cardiovascular: Normal rate and regular rhythm.   Pulmonary/Chest: Effort normal and breath sounds normal.  Abdominal: Soft. Bowel sounds are normal. There is no tenderness.  Musculoskeletal: Normal range of motion. He exhibits tenderness. He exhibits no edema.  Crepitus present in both knee joint, ROM in LE limited with body habitus, strength adequate  Lymphadenopathy:    He has no cervical adenopathy.  Neurological: He is alert and oriented to person, place, and time. No cranial nerve deficit.  Skin: Skin is warm and dry. He is not diaphoretic.  Psychiatric: He has a normal mood and affect.    BP 188/101  Pulse 82  Temp(Src) 98.2 F (36.8 C) (Oral)  Resp 16  Ht 5' 10.5" (1.791 m)  Wt 345 lb (156.491 kg)  BMI 48.79 kg/m2      Assessment & Plan:  Will need records from prior PCP and urology office. Patient will sign medical release form  diabetets mellitus- on metformin 1000 mg bidand glimeperide 5 mg (pt not exaclty sure how many times a day). Will need to review prior records. Pt mentions getting  lab work 1 month back. Will need to check a1c prior to next visit. Never been on insulin before. Continue baby aspirin for now. Not on statin. Continue lisinopril for now. Monitor cbg and bring reading next visit for review  HTN- repeat check of bp shows improved but still elevated reading. Pt has not taken his bp meds this am. Will continue amlodipine 10 mg and lisinopril 10 mg daily for  now. Check bp at home and bring recording to the office. Warning signs with elevated bp readings explained  irone def anemia- continue iron supplement for now  Osteoarthritis- continue tramadol for now   Morbid obesity- will provide nutritional referral for now for diet education. Will check labs prior to next visit and assess further on dm , htn, cholesterol.

## 2012-05-26 DIAGNOSIS — M1710 Unilateral primary osteoarthritis, unspecified knee: Secondary | ICD-10-CM | POA: Insufficient documentation

## 2012-05-26 DIAGNOSIS — I10 Essential (primary) hypertension: Secondary | ICD-10-CM | POA: Insufficient documentation

## 2012-06-16 ENCOUNTER — Encounter: Payer: Self-pay | Admitting: *Deleted

## 2012-06-16 ENCOUNTER — Encounter: Payer: 59 | Attending: Internal Medicine | Admitting: *Deleted

## 2012-06-16 VITALS — Ht 70.0 in | Wt 345.0 lb

## 2012-06-16 DIAGNOSIS — Z713 Dietary counseling and surveillance: Secondary | ICD-10-CM | POA: Insufficient documentation

## 2012-06-16 DIAGNOSIS — E119 Type 2 diabetes mellitus without complications: Secondary | ICD-10-CM | POA: Insufficient documentation

## 2012-06-16 NOTE — Patient Instructions (Signed)
Plan:  Aim for 4 Carb Choices per meal (60 grams) +/- 1 either way  Aim for 0-2 Carbs per snack if hungry  Consider reading food labels for Total Carbohydrate of foods Consider  increasing your activity level by doing armchair exercises for 15-30 minutes daily as tolerated Consider checking BG at alternate times per day

## 2012-06-16 NOTE — Progress Notes (Signed)
  Medical Nutrition Therapy:  Appt start time: 1130 end time:  1230.  Assessment:  Primary concerns today: diabetes and weight management. Lives with wife and one daughter and her daughter. Comes from a big family, lots of celebrations and therefore lots of food around. 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 He states he SMBG every other day. No previous diabetes education. He states he was preparing for Bariatric Surgery a couple of years ago but was cancelled when his insurance changed.   MEDICATIONS: see list. Diabetes medications include Glipizide and Metformin   DIETARY INTAKE: Usual eating pattern includes 2-3 meals and 2 snacks per day.  Everyday foods include fair variety of all food groups, includes fried foods often.  Avoided foods include [place] bacon, coffee, regular soda .    24-hr recall:  B ( AM): skips often, may have eggs   Snk ( AM): fresh fruit OR egg and cheese biscuit sometimes  L ( PM): eats out often, [employer] fried chicken, potato wedges, coleslaw, diet soda Snk ( PM): none D ( PM): baked meat, vegetable, maybe a stir fry, maybe starches on the weekend. Snk ( PM): PNB sandwich (no jelly)  Beverages: water diet soda  Usual physical activity: limited due to bad knees, used to be active with karate and other sports  Estimated energy needs: 1600 calories 180 g carbohydrates 120 g protein 44 g fat  Progress Towards Goal(s):  In progress.   Nutritional Diagnosis:  NI-1.5 Excessive energy intake As related to activity level.  As evidenced by BMI of 49.6.    Intervention:  Nutrition counseling and diabetes education initiated. Discussed basic physiology of diabetes, SMBG and rationale of checking BG at alternate times of day, A1c, Carb Counting and reading food labels, and benefits of increased activity. Discussed options for increasing his activity without standing such as Arm Chair Exercises which he said he was familiar with. Plan to discuss SMBG in  more detail at next visit.  . Plan:  Aim for 4 Carb Choices per meal (60 grams) +/- 1 either way  Aim for 0-2 Carbs per snack if hungry  Consider reading food labels for Total Carbohydrate of foods Consider  increasing your activity level by doing armchair exercises for 15-30 minutes daily as tolerated Consider checking BG at alternate times per day   Handouts given during visit include: Living Well with Diabetes Carb Counting and Food Label handouts Meal Plan Card  Resource for Bariatric Program  Monitoring/Evaluation:  Dietary intake, exercise, reading food labels, and body weight in 4 week(s).

## 2012-07-18 ENCOUNTER — Encounter: Payer: 59 | Attending: Internal Medicine | Admitting: *Deleted

## 2012-07-18 DIAGNOSIS — Z713 Dietary counseling and surveillance: Secondary | ICD-10-CM | POA: Insufficient documentation

## 2012-07-18 DIAGNOSIS — E119 Type 2 diabetes mellitus without complications: Secondary | ICD-10-CM | POA: Insufficient documentation

## 2012-07-18 NOTE — Patient Instructions (Signed)
Plan:  Continue to aim for 4 Carb Choices per meal (60 grams) +/- 1 either way  Continue to aim for 0-2 Carbs per snack if hungry  Continue reading food labels for Total Carbohydrate of foods Consider  increasing your activity level by doing armchair exercises for 15-30 minutes daily as tolerated Continue checking BG at alternate times per day

## 2012-07-18 NOTE — Progress Notes (Signed)
  Medical Nutrition Therapy:  Appt start time: 1100 end time:  1130.  Assessment:  Primary concerns today: diabetes and weight management follow up visit. His wife is with him today as well. States BG are better, usual range now is 97 - 167 before and after meals. He increased his walking but inflamed his hip so not able to lately. States he is getting ready to put an above ground pool up in backyard, so looking forward to increasing his activity level in the pool.  MEDICATIONS: see list. Diabetes medications include Glipizide and Metformin   DIETARY INTAKE: Usual eating pattern includes 2-3 meals and 2 snacks per day.  Everyday foods include fair variety of all food groups, includes fried foods often.  Avoided foods include [place] bacon, coffee, regular soda .    24-hr recall:  B ( AM): skips often, may have eggs   Snk ( AM): fresh fruit OR egg and cheese biscuit sometimes  L ( PM): eats out often, [employer] fried chicken, potato wedges, coleslaw, diet soda Snk ( PM): none D ( PM): baked meat, vegetable, maybe a stir fry, maybe starches on the weekend. Snk ( PM): PNB sandwich (no jelly)  Beverages: water diet soda  Usual physical activity: limited due to bad knees, used to be active with karate and other sports  Estimated energy needs: 1600 calories 180 g carbohydrates 120 g protein 44 g fat  Progress Towards Goal(s):  In progress.   Nutritional Diagnosis:  NI-1.5 Excessive energy intake As related to activity level.  As evidenced by BMI of 49.6.    Intervention:  Commended him on 3 pound weight loss, Reviewed Carb Counting and rationale for increasing his activity level in order for any weight loss to continue. Also encouraged him to test his BG after any exercise to see the results of BG control when he is active which can be quite motivating.  Plan:  Continue to aim for 4 Carb Choices per meal (60 grams) +/- 1 either way  Continue to aim for 0-2 Carbs per snack if hungry  Continue  reading food labels for Total Carbohydrate of foods Consider  increasing your activity level by doing armchair exercises or swimming for 15-30 minutes daily as tolerated Continue checking BG at alternate times per day    Handouts given during visit include:  No new handouts today  Monitoring/Evaluation:  Dietary intake, exercise, reading food labels, and body weight in 2 months.

## 2012-08-24 ENCOUNTER — Other Ambulatory Visit: Payer: 59

## 2012-08-24 ENCOUNTER — Other Ambulatory Visit: Payer: Self-pay | Admitting: Internal Medicine

## 2012-08-24 DIAGNOSIS — E119 Type 2 diabetes mellitus without complications: Secondary | ICD-10-CM

## 2012-08-25 LAB — CBC WITH DIFFERENTIAL/PLATELET
Basophils Absolute: 0 10*3/uL (ref 0.0–0.2)
Basos: 0 % (ref 0–3)
Eos: 0 % (ref 0–5)
Eosinophils Absolute: 0 10*3/uL (ref 0.0–0.4)
HCT: 39.8 % (ref 37.5–51.0)
Hemoglobin: 13.3 g/dL (ref 12.6–17.7)
Immature Grans (Abs): 0 10*3/uL (ref 0.0–0.1)
Immature Granulocytes: 0 % (ref 0–2)
Lymphocytes Absolute: 2.5 10*3/uL (ref 0.7–3.1)
Lymphs: 26 % (ref 14–46)
MCH: 27.4 pg (ref 26.6–33.0)
MCHC: 33.4 g/dL (ref 31.5–35.7)
MCV: 82 fL (ref 79–97)
Monocytes Absolute: 0.5 10*3/uL (ref 0.1–0.9)
Monocytes: 5 % (ref 4–12)
Neutrophils Absolute: 6.4 10*3/uL (ref 1.4–7.0)
Neutrophils Relative %: 69 % (ref 40–74)
RBC: 4.86 x10E6/uL (ref 4.14–5.80)
RDW: 14.5 % (ref 12.3–15.4)
WBC: 9.4 10*3/uL (ref 3.4–10.8)

## 2012-08-25 LAB — COMPREHENSIVE METABOLIC PANEL
ALT: 12 IU/L (ref 0–44)
AST: 9 IU/L (ref 0–40)
Albumin/Globulin Ratio: 1.4 (ref 1.1–2.5)
Albumin: 4.1 g/dL (ref 3.6–4.8)
Alkaline Phosphatase: 100 IU/L (ref 39–117)
BUN/Creatinine Ratio: 18 (ref 10–22)
BUN: 24 mg/dL (ref 8–27)
CO2: 25 mmol/L (ref 18–29)
Calcium: 9.2 mg/dL (ref 8.6–10.2)
Chloride: 98 mmol/L (ref 97–108)
Creatinine, Ser: 1.31 mg/dL — ABNORMAL HIGH (ref 0.76–1.27)
GFR calc Af Amer: 67 mL/min/{1.73_m2} (ref >59)
GFR calc non Af Amer: 58 mL/min/{1.73_m2} — ABNORMAL LOW (ref >59)
Globulin, Total: 2.9 g/dL (ref 1.5–4.5)
Glucose: 334 mg/dL — ABNORMAL HIGH (ref 65–99)
Potassium: 3.4 mmol/L — ABNORMAL LOW (ref 3.5–5.2)
Sodium: 141 mmol/L (ref 134–144)
Total Bilirubin: 0.4 mg/dL (ref 0.0–1.2)
Total Protein: 7 g/dL (ref 6.0–8.5)

## 2012-08-25 LAB — LIPID PANEL
Chol/HDL Ratio: 4 ratio units (ref 0.0–5.0)
Cholesterol, Total: 214 mg/dL — ABNORMAL HIGH (ref 100–199)
HDL: 53 mg/dL (ref >39)
LDL Calculated: 137 mg/dL — ABNORMAL HIGH (ref 0–99)
Triglycerides: 121 mg/dL (ref 0–149)
VLDL Cholesterol Cal: 24 mg/dL (ref 5–40)

## 2012-08-25 LAB — HEMOGLOBIN A1C
Est. average glucose Bld gHb Est-mCnc: 212 mg/dL
Hgb A1c MFr Bld: 9 % — ABNORMAL HIGH (ref 4.8–5.6)

## 2012-08-28 ENCOUNTER — Emergency Department (INDEPENDENT_AMBULATORY_CARE_PROVIDER_SITE_OTHER)
Admission: EM | Admit: 2012-08-28 | Discharge: 2012-08-28 | Disposition: A | Discharge status: Home / Self Care | Payer: 59 | Source: Home / Self Care

## 2012-08-28 ENCOUNTER — Encounter (HOSPITAL_COMMUNITY): Payer: Self-pay | Admitting: *Deleted

## 2012-08-28 DIAGNOSIS — N390 Urinary tract infection, site not specified: Secondary | ICD-10-CM

## 2012-08-28 LAB — POCT URINALYSIS DIP (DEVICE)
Glucose, UA: 500 mg/dL — AB
Ketones, ur: 40 mg/dL — AB
Nitrite: NEGATIVE
Protein, ur: >=300 mg/dL — AB
Specific Gravity, Urine: 1.015 (ref 1.005–1.030)
Urobilinogen, UA: 2 mg/dL — ABNORMAL HIGH (ref 0.0–1.0)
pH: 5.5 (ref 5.0–8.0)

## 2012-08-28 MED ORDER — CEPHALEXIN 500 MG PO CAPS: 500.0000 mg | ORAL_CAPSULE | Freq: Three times a day (TID) | ORAL | Status: DC

## 2012-08-28 NOTE — ED Notes (Signed)
Pt self-caths for each episode of voiding (long-term).  C/O more frequent need to void throughout last night; had some hematuria, which is getting heavier.  Has seen some blood clots, and will occasionally have to repeat cath due to clogging of cath.  Denies fevers or abd pain.

## 2012-08-28 NOTE — ED Provider Notes (Signed)
Medical screening examination/treatment/procedure(s) were performed by a resident physician and as supervising physician I was immediately available for consultation/collaboration.  Akemi Overholser, M.D.  Maryjayne Kleven C Marquest Gunkel, MD 08/28/12 1912 

## 2012-08-28 NOTE — ED Provider Notes (Signed)
Thomas [name] is a 62 y.o. male who presents to Urgent Care today for blood in urine associated with mild urinary frequency. Patient has a past medical history significant for prostate cancer with implantable radioactive seeds. This resulted in irritation to the bladder requiring self-catheterization of the last 3 years. About 3 months ago he had a bladder stretching procedure. He notes frank blood in his urine starting yesterday evening. He denies any pain fevers or chills and feels well otherwise. He is not on any blood thinners aside from 81 mg of aspirin daily.   PMH reviewed. As above History  Substance Use Topics  . Smoking status: Never Smoker   . Smokeless tobacco: Never Used  . Alcohol Use: No     Comment: never   ROS as above Medications reviewed. No current facility-administered medications for this encounter.   Current Outpatient Prescriptions  Medication Sig Dispense Refill  . AMLODIPINE BESYLATE PO Take 10 mg by mouth.       Marland Kitchen aspirin 81 MG tablet Take 81 mg by mouth daily.      Marland Kitchen glipiZIDE (GLUCOTROL) 5 MG tablet Take 5 mg by mouth daily.       . iron polysaccharides (NIFEREX) 150 MG capsule Take 150 mg by mouth 2 (two) times daily.      Marland Kitchen lisinopril (PRINIVIL,ZESTRIL) 10 MG tablet Take one tablet once daily for blood pressure      . metFORMIN (GLUCOPHAGE) 1000 MG tablet Take 1,000 mg by mouth 2 (two) times daily with a meal.      . OXYCODONE HCL PO Take by mouth as needed.      . traMADol (ULTRAM) 50 MG tablet TAKE 1 TABLET BY MOUTH 4 TIMES DAILY  120 tablet  3  . vitamin B-12 (CYANOCOBALAMIN) 1000 MCG tablet Take 1,000 mcg by mouth daily.        Exam:  BP 141/73  Pulse 66  Temp(Src) 98.5 F (36.9 C) (Oral)  Resp 16  SpO2 100% Gen: Well NAD HEENT: EOMI,  MMM Lungs: CTABL Nl WOB Heart: RRR no MRG Abd: NABS, NT, ND Exts: Non edematous BL  LE, warm and well perfused.   Results for orders placed during the hospital encounter of 08/28/12 (from the past 24  hour(s))  POCT URINALYSIS DIP (DEVICE)     Status: Abnormal   Collection Time    08/28/12  5:35 PM      Result Value Range   Glucose, UA 500 (*) NEGATIVE mg/dL   Bilirubin Urine LARGE (*) NEGATIVE   Ketones, ur 40 (*) NEGATIVE mg/dL   Specific Gravity, Urine 1.015  1.005 - 1.030   Hgb urine dipstick LARGE (*) NEGATIVE   pH 5.5  5.0 - 8.0   Protein, ur >=300 (*) NEGATIVE mg/dL   Urobilinogen, UA 2.0 (*) 0.0 - 1.0 mg/dL   Nitrite NEGATIVE  NEGATIVE   Leukocytes, UA LARGE (*) NEGATIVE   No results found.  Assessment and Plan: 62 y.o. male with UTI with hematuria in the setting of prostate cancer with radiation therapy.  Discussed the case with the on-call urologist.  Plan: Urine culture,  Empiric antibiotic therapy with Keflex, followup with urology tomorrow.  Discussed warning signs or symptoms. Please see discharge instructions. Patient expresses understanding.      Rodolph Bong, MD 08/28/12 [phone]

## 2012-08-29 LAB — URINE CULTURE
Colony Count: NO GROWTH
Culture: NO GROWTH

## 2012-08-30 ENCOUNTER — Ambulatory Visit (INDEPENDENT_AMBULATORY_CARE_PROVIDER_SITE_OTHER): Payer: 59 | Admitting: Internal Medicine

## 2012-08-30 VITALS — BP 162/78 | HR 75 | Temp 98.0°F | Resp 18 | Ht 70.87 in | Wt 346.6 lb

## 2012-08-30 DIAGNOSIS — M1710 Unilateral primary osteoarthritis, unspecified knee: Secondary | ICD-10-CM

## 2012-08-30 DIAGNOSIS — E1165 Type 2 diabetes mellitus with hyperglycemia: Secondary | ICD-10-CM

## 2012-08-30 DIAGNOSIS — E785 Hyperlipidemia, unspecified: Secondary | ICD-10-CM

## 2012-08-30 DIAGNOSIS — IMO0001 Reserved for inherently not codable concepts without codable children: Secondary | ICD-10-CM

## 2012-08-30 DIAGNOSIS — IMO0002 Reserved for concepts with insufficient information to code with codable children: Secondary | ICD-10-CM

## 2012-08-30 DIAGNOSIS — I10 Essential (primary) hypertension: Secondary | ICD-10-CM

## 2012-08-30 MED ORDER — METFORMIN HCL 1000 MG PO TABS: 500.0000 mg | ORAL_TABLET | Freq: Two times a day (BID) | ORAL | Status: DC

## 2012-08-30 MED ORDER — LISINOPRIL 40 MG PO TABS: 40.0000 mg | ORAL_TABLET | Freq: Every day | ORAL | Status: DC

## 2012-08-30 MED ORDER — OXYCODONE-ACETAMINOPHEN 10-325 MG PO TABS: 1.0000 | ORAL_TABLET | Freq: Two times a day (BID) | ORAL | Status: DC

## 2012-08-30 MED ORDER — LINAGLIPTIN 5 MG PO TABS: 5.0000 mg | ORAL_TABLET | Freq: Every day | ORAL | Status: DC

## 2012-08-30 MED ORDER — ATORVASTATIN CALCIUM 10 MG PO TABS: 10.0000 mg | ORAL_TABLET | Freq: Every day | ORAL | Status: DC

## 2012-08-30 MED ORDER — GLIPIZIDE 5 MG PO TABS: 10.0000 mg | ORAL_TABLET | Freq: Every day | ORAL | Status: DC

## 2012-08-30 NOTE — Progress Notes (Signed)
Patient ID: Thomas [name], male   DOB: [birth year], 62 y.o.   MRN: [id number]  CC- medical management of chronic illness  HPI- 62 y/o male patient here for routine follow up. He has recently established care with [place]. He had hematuria and problem with urination recently and was seen at urgent care and started on antibiotics for uti and asked to see his urology. He has hx of prostate cancer s/p radiation therapy. He has not seen his urologist yetHe will make appointment with urology this week He denies any further hematuria since yesterday He denies any dysuria He continue to take his keflex He has been complaint with current medication regimen Reviewed records from [facility]- hisotry of prostate cancer s/p brachytherapy3/2010, stage T1c, gleason 6, PSA 8. He does intermittent catheterization. He has also undergone balloon dilatation of urethral stricture in past Reviewed records from Dr Wayland Denis from [facility]- uncontrolled DM, HTN, obesity were chronic medical problems. Exercising for 10 minutes twice a day by walking for 4 days a week Follows with [facility]  diabetets mellitus- cbg was not checked this am. cbg yesterday am was 156. His cbg has been between 94-150 moslty. on metformin 1000 mg bid and glimeperide 5 mg daily. Reviewed a1c and it is 9.   Review of Systems  Constitutional: Negative for fever, chills and appetite change.  HENT: Negative for ear pain, congestion and ear discharge.   Eyes: Negative for pain and visual disturbance.        Wears corrective lenses and follows with dr Erma Heritage  Respiratory: Negative for cough, shortness of breath and wheezing.   Cardiovascular: Negative for chest pain, palpitations and leg swelling.  Gastrointestinal: Positive for constipation. Negative for abdominal pain.        Eating more vegetable has been helpful. No blood in stool  Genitourinary: Positive for frequency. Negative for  flank pain.        Self catheterizes for 3 years. History of prostate cancer s/p seed treatment Nocturia present Follows with dr Shelby Dubin urology  Musculoskeletal: Positive for arthralgias. Negative for joint swelling.  Skin: Negative for rash and wound.  Allergic/Immunologic: Positive for environmental allergies.  Neurological: Negative for dizziness, tremors, syncope, weakness, light-headedness and headaches.  Hematological: Negative for adenopathy.  Psychiatric/Behavioral: Negative for suicidal ideas, behavioral problems, sleep disturbance, self-injury and agitation.   No Known Allergies  BP 162/78  Pulse 75  Temp(Src) 98 F (36.7 C) (Oral)  Resp 18  Ht 5' 10.87" (1.8 m)  Wt 346 lb 9.6 oz (157.217 kg)  BMI 48.52 kg/m2  SpO2 96%  Constitutional: He is oriented to person, place, and time. No distress.  Morbidly obese  HENT:   Head: Normocephalic and atraumatic.   Mouth/Throat: Oropharynx is clear and moist.  Eyes: Pupils are equal, round, and reactive to light.  Neck: Normal range of motion. Neck supple. No JVD present. No tracheal deviation present. No thyromegaly present.  Cardiovascular: Normal rate and regular rhythm.   Pulmonary/Chest: Effort normal and breath sounds normal.  Abdominal: Soft. Bowel sounds are normal. There is no tenderness.  Musculoskeletal: Normal range of motion. He exhibits no edema.  Crepitus present in both knee joint, ROM in LE limited with body habitus, strength adequate  Lymphadenopathy:    He has no cervical adenopathy.  Neurological: He is alert and oriented to person, place, and time. No cranial nerve deficit.  Skin: Skin is warm and dry. He is not diaphoretic.  Psychiatric: He  has a normal mood and affect.   Labs- CBC    Component Value Date/Time   WBC 9.4 08/24/2012 1012   WBC 8.0 12/06/2009 1045   RBC 4.86 08/24/2012 1012   RBC 4.07* 12/06/2009 1045   HGB 13.3 08/24/2012 1012   HGB 13.4 01/13/2008 1046   HCT 39.8 08/24/2012 1012    HCT 40.0 01/13/2008 1046   PLT 290 12/06/2009 1045   PLT 252 01/13/2008 1046   MCV 82 08/24/2012 1012   MCV 83 01/13/2008 1046   MCH 27.4 08/24/2012 1012   MCH 26.5 12/06/2009 1045   MCH 27.9* 01/13/2008 1046   MCHC 33.4 08/24/2012 1012   MCHC 31.6 12/06/2009 1045   MCHC 33.5 01/13/2008 1046   RDW 14.5 08/24/2012 1012   RDW 14.5 12/06/2009 1045   RDW 12.1 01/13/2008 1046   LYMPHSABS 2.5 08/24/2012 1012   LYMPHSABS 1.3 12/06/2009 1045   LYMPHSABS 2.3 01/13/2008 1046   MONOABS 0.4 12/06/2009 1045   EOSABS 0.0 08/24/2012 1012   EOSABS 0.0 12/06/2009 1045   EOSABS 0.1 01/13/2008 1046   BASOSABS 0.0 08/24/2012 1012   BASOSABS 0.0 12/06/2009 1045   BASOSABS 0.1 01/13/2008 1046    CMP     Component Value Date/Time   NA 141 08/24/2012 1012   NA 139 11/16/2011 1427   K 3.4* 08/24/2012 1012   CL 98 08/24/2012 1012   CO2 25 08/24/2012 1012   GLUCOSE 334* 08/24/2012 1012   GLUCOSE 328* 11/16/2011 1427   BUN 24 08/24/2012 1012   BUN 10 11/16/2011 1427   CREATININE 1.31* 08/24/2012 1012   CALCIUM 9.2 08/24/2012 1012   PROT 7.0 08/24/2012 1012   PROT 7.2 12/06/2009 1045   ALBUMIN 3.3* 12/06/2009 1045   AST 9 08/24/2012 1012   ALT 12 08/24/2012 1012   ALKPHOS 100 08/24/2012 1012   BILITOT 0.4 08/24/2012 1012   GFRNONAA 58* 08/24/2012 1012   GFRAA 67 08/24/2012 1012   Lipid Panel     Component Value Date/Time   TRIG 121 08/24/2012 1012   HDL 53 08/24/2012 1012   CHOLHDL 4.0 08/24/2012 1012   LDLCALC 137* 08/24/2012 1012   a1c 9.0  ASSESSMENT/PLAN  diabetets mellitus- Reviewed a1c and it is 9. Will need to check a1c prior to next visit. Never been on insulin before. Continue baby aspirin for now. Given worsening renal function, will decrease metformin to 500 mg bid and add tradjenta 5 mg daily. Also increase glucotrol to 5 mg bid. To check cbg and bring log next visit. If repeat a1c shows rising a1c, consider introducing insulin. Dietary counselling about what to eat, portion sizes provided. Also follows in nutrition clinic.  ldl > 100 and not on statin. Will start lipitor 10 mg daily. Continue asa and lisinopril. Check urine microalbumin. To see his eye doctor  uti- complete course of keflex and to see his urologist  Hyperlipidemia- goal ldl < 100. Will start him on lipitor 10 mg daily and monitor flp  HTN- bp elevated in office today,.almost forgot his appointment this am, has not taken his meds this am and was rushing to drop his wife and make it to the appointment.repeat check of bp shows improved but still elevated reading. Pt has not taken his bp meds this am. Will continue amlodipine 10 mg and lisinopril 10 mg daily for now. Check bp at home and bring recording to the office. Warning signs with elevated bp readings explained  irone def anemia- continue iron supplement for now  Osteoarthritis- continue tramadol for now with percocet

## 2012-08-30 NOTE — Patient Instructions (Signed)
Bring your glucometer next office visit

## 2012-08-31 LAB — MICROALBUMIN / CREATININE URINE RATIO
Creatinine, Ur: 173.7 mg/dL (ref 22.0–328.0)
MICROALB/CREAT RATIO: 13.2 mg/g creat (ref 0.0–30.0)
Microalbumin, Urine: 23 ug/mL — ABNORMAL HIGH (ref 0.0–17.0)

## 2012-09-02 ENCOUNTER — Encounter: Payer: Self-pay | Admitting: Internal Medicine

## 2012-09-02 DIAGNOSIS — E785 Hyperlipidemia, unspecified: Secondary | ICD-10-CM | POA: Insufficient documentation

## 2012-09-09 ENCOUNTER — Other Ambulatory Visit: Payer: Self-pay | Admitting: Geriatric Medicine

## 2012-09-09 MED ORDER — AMLODIPINE BESYLATE 10 MG PO TABS: 10.0000 mg | ORAL_TABLET | Freq: Every day | ORAL | Status: DC

## 2012-09-16 ENCOUNTER — Other Ambulatory Visit: Payer: Self-pay | Admitting: *Deleted

## 2012-09-16 DIAGNOSIS — I10 Essential (primary) hypertension: Secondary | ICD-10-CM

## 2012-09-16 DIAGNOSIS — E119 Type 2 diabetes mellitus without complications: Secondary | ICD-10-CM

## 2012-09-19 ENCOUNTER — Encounter: Payer: 59 | Attending: Internal Medicine | Admitting: *Deleted

## 2012-09-19 DIAGNOSIS — E119 Type 2 diabetes mellitus without complications: Secondary | ICD-10-CM | POA: Insufficient documentation

## 2012-09-19 DIAGNOSIS — Z713 Dietary counseling and surveillance: Secondary | ICD-10-CM | POA: Insufficient documentation

## 2012-09-19 NOTE — Progress Notes (Signed)
  Medical Nutrition Therapy:  Appt start time: 0830 end time:  0900.  Assessment:  Primary concerns today: diabetes and weight management follow up visit. Continues to check his BG daily 85- Has stopped eating red meat to help with cholesterol. Is drinking carrot juice and has decreased portion size from whole bottle to 1 glass. Is going to track with grandson and is walking twice a week and getting on treadmill at home. Treadmill has been moved to living room where whole family can use it and they are all keeping records of dates and times on the treadmill. States he is doing arm chair exercises daily too.  MEDICATIONS: see list. Diabetes medications include Glipizide and Metformin   DIETARY INTAKE: Usual eating pattern includes 2-3 meals and 2 snacks per day.  Everyday foods include fair variety of all food groups, includes fried foods often.  Avoided foods include [place] bacon, red meats, coffee, regular soda .    24-hr recall:  B ( AM): skips often, may have eggs   Snk ( AM): fresh fruit OR egg and cheese biscuit sometimes  L ( PM): eats out often, [employer] fried chicken, potato wedges, coleslaw, diet soda Snk ( PM): none D ( PM): baked meat, vegetable, maybe a stir fry, maybe starches on the weekend. Snk ( PM): PNB sandwich (no jelly)  Beverages: water diet soda  Usual physical activity:  Estimated energy needs: 1600 calories 180 g carbohydrates 120 g protein 44 g fat  Progress Towards Goal(s):  In progress.   Nutritional Diagnosis:  NI-1.5 Excessive energy intake As related to activity level.  As evidenced by BMI of 49.6.    Intervention:  Commended him on continued improvements in behaviors including portion sizes and increasing his activity level. He states he is more comfortable with Carb Counting. He states he has tested his BG after exercise and found his BG was 87 mg/dl which he said was motivating.  Plan:  Continue to aim for 4 Carb Choices per meal (60 grams) +/- 1 either  way  Continue to aim for 0-2 Carbs per snack if hungry  Continue reading food labels for Total Carbohydrate of foods Continue with walking track and using treadmill for 15-30 minutes daily as tolerated Continue checking BG at alternate times per day    Handouts given during visit include:  No new handouts today  Monitoring/Evaluation:  Dietary intake, exercise, reading food labels, and body weight in 1 months.

## 2012-09-22 ENCOUNTER — Other Ambulatory Visit: Payer: 59

## 2012-09-22 DIAGNOSIS — I10 Essential (primary) hypertension: Secondary | ICD-10-CM

## 2012-09-22 DIAGNOSIS — E119 Type 2 diabetes mellitus without complications: Secondary | ICD-10-CM

## 2012-09-23 ENCOUNTER — Other Ambulatory Visit: Payer: 59

## 2012-09-23 LAB — BASIC METABOLIC PANEL
BUN/Creatinine Ratio: 11 (ref 10–22)
BUN: 11 mg/dL (ref 8–27)
CO2: 23 mmol/L (ref 18–29)
Calcium: 8.2 mg/dL — ABNORMAL LOW (ref 8.6–10.2)
Chloride: 106 mmol/L (ref 97–108)
Creatinine, Ser: 1 mg/dL (ref 0.76–1.27)
GFR calc Af Amer: 93 mL/min/{1.73_m2} (ref >59)
GFR calc non Af Amer: 80 mL/min/{1.73_m2} (ref >59)
Glucose: 136 mg/dL — ABNORMAL HIGH (ref 65–99)
Potassium: 3.8 mmol/L (ref 3.5–5.2)
Sodium: 144 mmol/L (ref 134–144)

## 2012-09-27 ENCOUNTER — Encounter: Payer: Self-pay | Admitting: Internal Medicine

## 2012-09-27 ENCOUNTER — Ambulatory Visit (INDEPENDENT_AMBULATORY_CARE_PROVIDER_SITE_OTHER): Payer: 59 | Admitting: Internal Medicine

## 2012-09-27 VITALS — BP 192/100 | HR 77 | Temp 98.5°F | Resp 18 | Ht 70.0 in | Wt 348.4 lb

## 2012-09-27 DIAGNOSIS — E1165 Type 2 diabetes mellitus with hyperglycemia: Secondary | ICD-10-CM

## 2012-09-27 DIAGNOSIS — N058 Unspecified nephritic syndrome with other morphologic changes: Secondary | ICD-10-CM

## 2012-09-27 DIAGNOSIS — E1129 Type 2 diabetes mellitus with other diabetic kidney complication: Secondary | ICD-10-CM

## 2012-09-27 DIAGNOSIS — E785 Hyperlipidemia, unspecified: Secondary | ICD-10-CM

## 2012-09-27 DIAGNOSIS — M1710 Unilateral primary osteoarthritis, unspecified knee: Secondary | ICD-10-CM

## 2012-09-27 DIAGNOSIS — IMO0002 Reserved for concepts with insufficient information to code with codable children: Secondary | ICD-10-CM

## 2012-09-27 DIAGNOSIS — I160 Hypertensive urgency: Secondary | ICD-10-CM

## 2012-09-27 DIAGNOSIS — I10 Essential (primary) hypertension: Secondary | ICD-10-CM

## 2012-09-27 MED ORDER — ATORVASTATIN CALCIUM 10 MG PO TABS: 10.0000 mg | ORAL_TABLET | Freq: Every day | ORAL | Status: DC

## 2012-09-27 MED ORDER — METFORMIN HCL 1000 MG PO TABS: 500.0000 mg | ORAL_TABLET | Freq: Two times a day (BID) | ORAL | Status: DC

## 2012-09-27 MED ORDER — GLIPIZIDE 5 MG PO TABS: 10.0000 mg | ORAL_TABLET | Freq: Every day | ORAL | Status: DC

## 2012-09-27 MED ORDER — LINAGLIPTIN 5 MG PO TABS: 5.0000 mg | ORAL_TABLET | Freq: Every day | ORAL | Status: DC

## 2012-09-27 MED ORDER — POLYSACCHARIDE IRON COMPLEX 150 MG PO CAPS: 150.0000 mg | ORAL_CAPSULE | Freq: Two times a day (BID) | ORAL | Status: DC

## 2012-09-27 MED ORDER — LISINOPRIL 40 MG PO TABS: 40.0000 mg | ORAL_TABLET | Freq: Every day | ORAL | Status: DC

## 2012-09-27 MED ORDER — AMLODIPINE BESYLATE 10 MG PO TABS: 10.0000 mg | ORAL_TABLET | Freq: Every day | ORAL | Status: DC

## 2012-09-27 NOTE — Patient Instructions (Signed)

## 2012-09-29 DIAGNOSIS — E1129 Type 2 diabetes mellitus with other diabetic kidney complication: Secondary | ICD-10-CM | POA: Insufficient documentation

## 2012-09-29 DIAGNOSIS — I160 Hypertensive urgency: Secondary | ICD-10-CM | POA: Insufficient documentation

## 2012-09-29 NOTE — Progress Notes (Signed)
Patient ID: Thomas [name], male   DOB: [birth year], 62 y.o.   MRN: [id number]  Chief Complaint  Patient presents with  . Follow-up    BP High   HPI- 62 y/o male patient here for follow up on his blood pressure. He had elevated bp on last office visit. His medication was adjusted and he was seen today for follow up. His bp is elevated today. He mentions running out of his medication for 5 days and he was not able to go to the pharmacy to pick it up. Denies any headache, blurred vision, nausea, vomiting Denies abdominal pain, chest pain or dyspnea No further hematuria Has not checked his blood sugar recently  Of note: Reviewed records from [facility]- hisotry of prostate cancer s/p brachytherapy3/2010, stage T1c, gleason 6, PSA 8. He does intermittent catheterization. He has also undergone balloon dilatation of urethral stricture in past Reviewed records from Dr Wayland Denis from [facility]- uncontrolled DM, HTN, obesity were chronic medical problems. Exercising for 10 minutes twice a day by walking for 4 days a week Follows with [facility] team  Review of Systems   Constitutional: Negative for fever, chills and appetite change.   HENT: Negative for ear pain, congestion and ear discharge.    Eyes: Negative for pain and visual disturbance.        Wears corrective lenses and follows with dr Erma Heritage   Respiratory: Negative for cough, shortness of breath and wheezing.    Cardiovascular: Negative for chest pain, palpitations and leg swelling.   Gastrointestinal: Positive for constipation. Negative for abdominal pain.        Eating more vegetable has been helpful. No blood in stool   Genitourinary: Positive for frequency. Negative for flank pain.        Self catheterizes for 3 years. History of prostate cancer s/p seed treatment Nocturia present Follows with dr Shelby Dubin urology   Musculoskeletal: Positive for arthralgias. Negative for  joint swelling.   Skin: Negative for rash and wound.   Allergic/Immunologic: Positive for environmental allergies.   Neurological: Negative for dizziness, tremors, syncope, weakness, light-headedness and headaches.   Hematological: Negative for adenopathy.   Psychiatric/Behavioral: Negative for suicidal ideas, behavioral problems, sleep disturbance, self-injury and agitation.   No Known Allergies  BP 192/100  Pulse 77  Temp(Src) 98.5 F (36.9 C) (Oral)  Resp 18  Ht 5\' 10"  (1.778 m)  Wt 348 lb 6.4 oz (158.033 kg)  BMI 49.99 kg/m2  SpO2 97%  Constitutional: He is oriented to person, place, and time. No distress.  Morbidly obese   HENT:   Head: Normocephalic and atraumatic.   Mouth/Throat: Oropharynx is clear and moist.   Eyes: Pupils are equal, round, and reactive to light.   Neck: Normal range of motion. Neck supple. No JVD present. No tracheal deviation present. No thyromegaly present.   Cardiovascular: Normal rate and regular rhythm.    Pulmonary/Chest: Effort normal and breath sounds normal.   Abdominal: Soft. Bowel sounds are normal. There is no tenderness.  Musculoskeletal: Normal range of motion. He exhibits no edema.  Crepitus present in both knee joint, ROM in LE limited with body habitus, strength adequate  Lymphadenopathy:    He has no cervical adenopathy.  Neurological: He is alert and oriented to person, place, and time. No cranial nerve deficit.   Skin: Skin is warm and dry. He is not diaphoretic.  Psychiatric: He has a normal mood and affect.    Labs reviewed  ASSESSMENT/PLAN  Hypertensive urgency- bp elevated in office today, has not taken his bp meds for 5 days. Gave 0.1 mg clonidine and repeat bp was 160/90. To pick his scripts, has good number of refills. Med compliance reinforced and explained the outcomes of elevated bp like stroke and heart attack. Pt voices understanding this and willing to be complaint with his medications and not miss doses. Continue  amlodipine and lisinopril  diabetets mellitus- Reviewed a1c and it is 9. Continue metformin 500 mg bid and tradjenta 5 mg daily. Continue glucotrol 5 mg bid. To check cbg and bring log next visit. Will need to take him off metformin next visit after recheck on a1c. Continue asa, lipitor and lisinopril.   Hyperlipidemia- goal ldl < 100.continue lipitor 10 mg daily  irone def anemia- continue iron supplement for now  Osteoarthritis- continue tramadol for now with percocet

## 2012-10-25 ENCOUNTER — Encounter: Payer: Self-pay | Admitting: Internal Medicine

## 2012-10-25 ENCOUNTER — Ambulatory Visit (INDEPENDENT_AMBULATORY_CARE_PROVIDER_SITE_OTHER): Payer: 59 | Admitting: Internal Medicine

## 2012-10-25 VITALS — BP 162/90 | HR 85 | Temp 98.4°F | Resp 18 | Ht 70.0 in | Wt 347.6 lb

## 2012-10-25 DIAGNOSIS — IMO0002 Reserved for concepts with insufficient information to code with codable children: Secondary | ICD-10-CM

## 2012-10-25 DIAGNOSIS — I160 Hypertensive urgency: Secondary | ICD-10-CM

## 2012-10-25 DIAGNOSIS — E1129 Type 2 diabetes mellitus with other diabetic kidney complication: Secondary | ICD-10-CM

## 2012-10-25 DIAGNOSIS — I10 Essential (primary) hypertension: Secondary | ICD-10-CM

## 2012-10-25 DIAGNOSIS — N058 Unspecified nephritic syndrome with other morphologic changes: Secondary | ICD-10-CM

## 2012-10-25 DIAGNOSIS — M1710 Unilateral primary osteoarthritis, unspecified knee: Secondary | ICD-10-CM

## 2012-10-25 DIAGNOSIS — R0789 Other chest pain: Secondary | ICD-10-CM | POA: Insufficient documentation

## 2012-10-25 MED ORDER — NAPROXEN 500 MG PO TABS: 500.0000 mg | ORAL_TABLET | Freq: Three times a day (TID) | ORAL | Status: DC

## 2012-10-25 MED ORDER — OXYCODONE-ACETAMINOPHEN 5-325 MG PO TABS: 1.0000 | ORAL_TABLET | Freq: Two times a day (BID) | ORAL | Status: DC | PRN

## 2012-10-25 MED ORDER — TRAMADOL HCL 50 MG PO TABS: 50.0000 mg | ORAL_TABLET | Freq: Four times a day (QID) | ORAL | Status: DC | PRN

## 2012-10-25 MED ORDER — HYDROCHLOROTHIAZIDE 25 MG PO TABS: 25.0000 mg | ORAL_TABLET | Freq: Every day | ORAL | Status: DC

## 2012-10-25 NOTE — Progress Notes (Signed)
Patient ID: Thomas [name], male   DOB: September 25, 1950, 61 y.o.   MRN: [id number]  Chief Complaint  Patient presents with  . Follow-up    HTN, DM  . sharp left chest pain   No Known Allergies   HPI He has noticed left sided chest pain for 2 days. It is intermittent, feels like spasm on left 6th interocostal area on lateral side. Denies any known fall/ trauma. Agrees to have lifted 40 lbs box to help his sister with the move 2 days back in the afternoon. Pain started that evening and he grades it as 7-8/10 when severe. He has not taken anything for the pain. No aggrevating factor, feels like a sore at present, no relieving factor. Blood pressure elevated in office this visit. He has not been checking it at home. He is on amlodipine 10 mg daily and lisinopril 40 mg daily. Denies adding salt in food. Careful about his food (better than before). Has been walking for exercise. No chest pain, shortness of breath, abdominal pain, nausea or vomiting Blood sugar this am was 217. He had eaten chicken biscuit prior to the check. Sugar reading at home ranging between 90- 270 with most readings averaging at 110-120. Denies hypoglycemia. Taking glipizide, metformin and tradjenta  Reviewed labs result with patient  Of note: Reviewed records from [facility]- hisotry of prostate cancer s/p brachytherapy3/2010, stage T1c, gleason 6, PSA 8. He does intermittent catheterization. He has also undergone balloon dilatation of urethral stricture in past Reviewed records from Dr Wayland Denis from [facility]- uncontrolled DM, HTN, obesity were chronic medical problems.  Review of Systems   Constitutional: Negative for fever, chills and appetite change.   HENT: Negative for ear pain, congestion and ear discharge.    Eyes: Negative for pain and visual disturbance.        Wears corrective lenses and follows with dr Erma Heritage   Respiratory: Negative for cough, shortness of breath and  wheezing.    Cardiovascular: Negative for chest pain, palpitations and leg swelling.   Gastrointestinal: no abdominal pain no constipation Genitourinary: Positive for frequency. Negative for flank pain.        Self catheterizes for 3 years. History of prostate cancer s/p seed treatment Nocturia present Follows with dr Shelby Dubin urology   Musculoskeletal: Positive for arthralgias. Negative for joint swelling.   Skin: Negative for rash and wound.   Allergic/Immunologic: Positive for environmental allergies.   Neurological: Negative for dizziness, tremors, syncope, weakness, light-headedness and headaches.   Hematological: Negative for adenopathy.   Psychiatric/Behavioral: Negative for suicidal ideas, behavioral problems, sleep disturbance, self-injury and agitation.   Past Medical History  Diagnosis Date  . Diabetes mellitus   . Hypertension   . Arthritis   . Chronic kidney disease   . Cancer 2010    Prostate    Medication reviewed   Physical exam  BP 162/90  Pulse 85  Temp(Src) 98.4 F (36.9 C) (Oral)  Resp 18  Ht 5\' 10"  (1.778 m)  Wt 347 lb 9.6 oz (157.67 kg)  BMI 49.88 kg/m2  SpO2 96%  Constitutional: He is oriented to person, place, and time. No distress.  Morbidly obese   HENT:   Head: Normocephalic and atraumatic.   Mouth/Throat: Oropharynx is clear and moist.   Eyes: Pupils are equal, round, and reactive to light.   Neck: Normal range of motion. Neck supple. No JVD present. No tracheal deviation present. No thyromegaly present.   Cardiovascular: Normal rate and  regular rhythm.    Pulmonary/Chest: Effort normal and breath sounds normal.  tenderness in lateral 4th intercostal area on palpation. Reproducible pain Abdominal: Soft. Bowel sounds are normal. There is no tenderness.  Musculoskeletal: Normal range of motion. He exhibits no edema.  Crepitus present in both knee joint, ROM in LE limited with body habitus, strength adequate  Lymphadenopathy:    He has  no cervical adenopathy.  Neurological: He is alert and oriented to person, place, and time. No cranial nerve deficit.   Skin: Skin is warm and dry. He is not diaphoretic. No skin lesion noted Psychiatric: He has a normal mood and affect.    Labs-  CMP     Component Value Date/Time   NA 144 09/22/2012 0837   NA 139 11/16/2011 1427   K 3.8 09/22/2012 0837   CL 106 09/22/2012 0837   CO2 23 09/22/2012 0837   GLUCOSE 136* 09/22/2012 0837   GLUCOSE 328* 11/16/2011 1427   BUN 11 09/22/2012 0837   BUN 10 11/16/2011 1427   CREATININE 1.00 09/22/2012 0837   CALCIUM 8.2* 09/22/2012 0837   PROT 7.0 08/24/2012 1012   PROT 7.2 12/06/2009 1045   ALBUMIN 3.3* 12/06/2009 1045   AST 9 08/24/2012 1012   ALT 12 08/24/2012 1012   ALKPHOS 100 08/24/2012 1012   BILITOT 0.4 08/24/2012 1012   GFRNONAA 80 09/22/2012 0837   GFRAA 93 09/22/2012 0837     ASSESSMENT/PLAN  Hypertensive urgency- bp elevated in office today, continue salt restriciton and walking for exercise. Will continue lisinopril and amlodipine. Will add hctz 25 mg daily. Common side effects explained. Check bmp prior to next visit  Musculoskeletal pain- likely from muscle sprain while lifting heavy box. No cardiac or respiratory symptoms. Will have him on naprosyn 500 mg tid for 5 days to help with the inflammation  diabetets mellitus- Reviewed a1c and it is 9. Continue metformin 500 mg bid and tradjenta 5 mg daily. Continue glucotrol 5 mg bid. To check cbg and bring log next visit. Has microalbuminuria. Continue liisnopril. Continue asa, lipitor  Osteoarthritis- continue tramadol for now with percocet prn, refill provided  Morbid obesity- continue exercise (walk) and diet modification.

## 2012-10-25 NOTE — Patient Instructions (Signed)
Check blood pressure and bring reading next office visit

## 2012-10-31 DIAGNOSIS — R339 Retention of urine, unspecified: Secondary | ICD-10-CM | POA: Diagnosis not present

## 2012-10-31 DIAGNOSIS — C61 Malignant neoplasm of prostate: Secondary | ICD-10-CM | POA: Diagnosis not present

## 2012-10-31 DIAGNOSIS — N35919 Unspecified urethral stricture, male, unspecified site: Secondary | ICD-10-CM | POA: Diagnosis not present

## 2012-11-29 ENCOUNTER — Other Ambulatory Visit: Payer: 59

## 2012-11-29 DIAGNOSIS — E1129 Type 2 diabetes mellitus with other diabetic kidney complication: Secondary | ICD-10-CM

## 2012-11-30 LAB — BASIC METABOLIC PANEL
BUN/Creatinine Ratio: 10 (ref 10–22)
BUN: 11 mg/dL (ref 8–27)
CO2: 23 mmol/L (ref 18–29)
Calcium: 8.9 mg/dL (ref 8.6–10.2)
Chloride: 101 mmol/L (ref 97–108)
Creatinine, Ser: 1.05 mg/dL (ref 0.76–1.27)
GFR calc Af Amer: 88 mL/min/{1.73_m2} (ref >59)
GFR calc non Af Amer: 76 mL/min/{1.73_m2} (ref >59)
Glucose: 194 mg/dL — ABNORMAL HIGH (ref 65–99)
Potassium: 3.7 mmol/L (ref 3.5–5.2)
Sodium: 142 mmol/L (ref 134–144)

## 2012-11-30 LAB — HEMOGLOBIN A1C
Est. average glucose Bld gHb Est-mCnc: 189 mg/dL
Hgb A1c MFr Bld: 8.2 % — ABNORMAL HIGH (ref 4.8–5.6)

## 2012-12-02 ENCOUNTER — Other Ambulatory Visit: Payer: 59

## 2012-12-06 ENCOUNTER — Ambulatory Visit: Payer: 59 | Admitting: Internal Medicine

## 2012-12-13 ENCOUNTER — Ambulatory Visit (INDEPENDENT_AMBULATORY_CARE_PROVIDER_SITE_OTHER): Payer: 59 | Admitting: Internal Medicine

## 2012-12-13 ENCOUNTER — Encounter: Payer: Self-pay | Admitting: Internal Medicine

## 2012-12-13 VITALS — BP 160/90 | HR 73 | Temp 98.3°F | Resp 16 | Wt 350.4 lb

## 2012-12-13 DIAGNOSIS — Z23 Encounter for immunization: Secondary | ICD-10-CM

## 2012-12-13 MED ORDER — CARVEDILOL 6.25 MG PO TABS: 6.2500 mg | ORAL_TABLET | Freq: Two times a day (BID) | ORAL | Status: DC

## 2012-12-13 MED ORDER — TRAMADOL HCL 50 MG PO TABS: 50.0000 mg | ORAL_TABLET | Freq: Three times a day (TID) | ORAL | Status: DC | PRN

## 2012-12-13 NOTE — Progress Notes (Signed)
Patient ID: Thomas [name], male   DOB: 1950/09/10, 62 y.o.   MRN: [id number]  Chief Complaint  Patient presents with  . Medical Managment of Chronic Issues    6 week f/u HTN/DM with labs printed  . other    Atorvastin caused pt to be faint/dizzy, so he stopped taking    Allergies  Allergen Reactions  . Atorvastatin Other (See Comments)    Dizziness/faint feeling   HPI 62 y/o male patient here for routine visit.  cbg this am was 129. cbg ranges in am between 110-140 with few readingsin 200 after eating sweets or high calorie food He is not doing any exercise He is trying to eat more of salads but ordering it from outside He has missed last nutrition appointment Blood pressure has been elevated in office and at home Does not want influenza vaccine Has not had pneumococcal vaccine and tdap in past Stopped taking statin with complaints of dizziness  Of note: Reviewed records from [facility]- history of prostate cancer s/p brachytherapy3/2010, stage T1c, gleason 6, PSA 8. He does intermittent catheterization. He has also undergone balloon dilatation of urethral stricture in past Reviewed records from Dr Wayland Denis from [facility]- uncontrolled DM, HTN, obesity were chronic medical problems.  Review of Systems   Constitutional: Negative for fever, chills and appetite change.   HENT: Negative for ear pain, congestion and ear discharge.    Eyes: Negative for pain and visual disturbance.        Wears corrective lenses and follows with dr Erma Heritage   Respiratory: Negative for cough, shortness of breath and wheezing.    Cardiovascular: Negative for chest pain, palpitations and leg swelling.   Gastrointestinal: no abdominal pain no constipation Genitourinary: Positive for frequency. Negative for flank pain.        Self catheterizes for 3 years. History of prostate cancer s/p seed treatment Nocturia present Follows with dr Shelby Dubin urology    Musculoskeletal: Positive for arthralgias. Negative for joint swelling.   Skin: Negative for rash and wound.   Allergic/Immunologic: Positive for environmental allergies.   Neurological: Negative for dizziness, tremors, syncope, weakness, light-headedness and headaches.   Hematological: Negative for adenopathy.   Psychiatric/Behavioral: Negative for suicidal ideas, behavioral problems, sleep disturbance, self-injury and agitation.   Past Medical History  Diagnosis Date  . Diabetes mellitus   . Hypertension   . Arthritis   . Chronic kidney disease   . Cancer 2010    Prostate   Past Surgical History  Procedure Laterality Date  . Knee surgery    . Shoulder surgery    . Hernia repair    . Prostate surgery    . Uretha surgery-2014    . Radioactive seed implant      Current Outpatient Prescriptions on File Prior to Visit  Medication Sig Dispense Refill  . amLODipine (NORVASC) 10 MG tablet Take 1 tablet (10 mg total) by mouth daily.  90 tablet  3  . aspirin 81 MG tablet Take 81 mg by mouth daily.      Marland Kitchen glipiZIDE (GLUCOTROL) 5 MG tablet Take 2 tablets (10 mg total) by mouth daily.  90 tablet  3  . hydrochlorothiazide (HYDRODIURIL) 25 MG tablet Take 1 tablet (25 mg total) by mouth daily.  30 tablet  3  . iron polysaccharides (NIFEREX) 150 MG capsule Take 1 capsule (150 mg total) by mouth 2 (two) times daily.  60 capsule  3  . linagliptin (TRADJENTA) 5 MG TABS  tablet Take 1 tablet (5 mg total) by mouth daily.  90 tablet  3  . lisinopril (PRINIVIL,ZESTRIL) 40 MG tablet Take 1 tablet (40 mg total) by mouth daily. Take one tablet once daily for blood pressure  90 tablet  3  . metFORMIN (GLUCOPHAGE) 1000 MG tablet Take 0.5 tablets (500 mg total) by mouth 2 (two) times daily with a meal.  60 tablet  3  . oxyCODONE-acetaminophen (ROXICET) 5-325 MG per tablet Take 1 tablet by mouth every 12 (twelve) hours as needed for pain.  20 tablet  0  . vitamin B-12 (CYANOCOBALAMIN) 1000 MCG tablet Take  1,000 mcg by mouth daily.       No current facility-administered medications on file prior to visit.    Physical exam BP 160/90  Pulse 73  Temp(Src) 98.3 F (36.8 C) (Oral)  Resp 16  Wt 350 lb 6.4 oz (158.94 kg)  BMI 50.28 kg/m2  SpO2 99%  Constitutional: He is oriented to person, place, and time. No distress.  Morbidly obese   HENT:   Head: Normocephalic and atraumatic.   Mouth/Throat: Oropharynx is clear and moist.   Eyes: Pupils are equal, round, and reactive to light.   Neck: Normal range of motion. Neck supple. No JVD present. No tracheal deviation present. No thyromegaly present.   Cardiovascular: Normal rate and regular rhythm.    Pulmonary/Chest: Effort normal and breath sounds normal.  tenderness in lateral 4th intercostal area on palpation. Reproducible pain Abdominal: Soft. Bowel sounds are normal. There is no tenderness.  Musculoskeletal: Normal range of motion. He exhibits no edema.  Crepitus present in both knee joint, ROM in LE limited with body habitus, strength adequate  Lymphadenopathy:    He has no cervical adenopathy.  Neurological: He is alert and oriented to person, place, and time. No cranial nerve deficit.   Skin: Skin is warm and dry. He is not diaphoretic. No skin lesion noted Psychiatric: He has a normal mood and affect.   Labs- Lipid Panel     Component Value Date/Time   TRIG 121 08/24/2012 1012   HDL 53 08/24/2012 1012   CHOLHDL 4.0 08/24/2012 1012   LDLCALC 137* 08/24/2012 1012   CBC    Component Value Date/Time   WBC 9.4 08/24/2012 1012   WBC 8.0 12/06/2009 1045   RBC 4.86 08/24/2012 1012   RBC 4.07* 12/06/2009 1045   HGB 13.3 08/24/2012 1012   HGB 13.4 01/13/2008 1046   HCT 39.8 08/24/2012 1012   HCT 40.0 01/13/2008 1046   PLT 290 12/06/2009 1045   PLT 252 01/13/2008 1046   MCV 82 08/24/2012 1012   MCV 83 01/13/2008 1046   MCH 27.4 08/24/2012 1012   MCH 26.5 12/06/2009 1045   MCH 27.9* 01/13/2008 1046   MCHC 33.4 08/24/2012 1012   MCHC 31.6  12/06/2009 1045   MCHC 33.5 01/13/2008 1046   RDW 14.5 08/24/2012 1012   RDW 14.5 12/06/2009 1045   RDW 12.1 01/13/2008 1046   LYMPHSABS 2.5 08/24/2012 1012   LYMPHSABS 1.3 12/06/2009 1045   LYMPHSABS 2.3 01/13/2008 1046   MONOABS 0.4 12/06/2009 1045   EOSABS 0.0 08/24/2012 1012   EOSABS 0.0 12/06/2009 1045   EOSABS 0.1 01/13/2008 1046   BASOSABS 0.0 08/24/2012 1012   BASOSABS 0.0 12/06/2009 1045   BASOSABS 0.1 01/13/2008 1046    CMP     Component Value Date/Time   NA 142 11/29/2012 0820   NA 139 11/16/2011 1427   K 3.7 11/29/2012 0820  CL 101 11/29/2012 0820   CO2 23 11/29/2012 0820   GLUCOSE 194* 11/29/2012 0820   GLUCOSE 328* 11/16/2011 1427   BUN 11 11/29/2012 0820   BUN 10 11/16/2011 1427   CREATININE 1.05 11/29/2012 0820   CALCIUM 8.9 11/29/2012 0820   PROT 7.0 08/24/2012 1012   PROT 7.2 12/06/2009 1045   ALBUMIN 3.3* 12/06/2009 1045   AST 9 08/24/2012 1012   ALT 12 08/24/2012 1012   ALKPHOS 100 08/24/2012 1012   BILITOT 0.4 08/24/2012 1012   GFRNONAA 76 11/29/2012 0820   GFRAA 88 11/29/2012 0820    Assessment/plan  Will provide pneumococcal vaccine today  Uncontrolled hypertension- Elevated blood pressure this visit. Has had elevated bp reading at home. Continue amlodipne 10 mg daily, hctz 25 mg daily, lisinopril 40 mg daily. Will add carvedilol 6.25 mg bid for now and reassess  Uncontrolled diabetets mellitus- Reviewed a1c and it is 8.3. Continue metformin 500 mg bid and tradjenta 5 mg daily. Continue glucotrol 5 mg bid. To check cbg and bring log next visit. Continue asa, lipitor and lisinopril. Check urine microalbumin  Hyperlipidemia- goal ldl < 100.pt does not want cholesterol medication for now  irone def anemia- continue iron supplement for now  Osteoarthritis- continue tramadol for now with percocet, monitor clinically  Spent more than 50 minutes of his visit counselling about diet, importance of exercise, enrolling into exercise program

## 2012-12-13 NOTE — Patient Instructions (Addendum)
BRING YOUR BLOOD PRESSURE AND SUGAR READING ON YOUR NEXT VISIT   REMEMBER TO GET YOUR FLU VACCINE IN A WEEK  Do not take naprosyn, advil, aleve, ibuprofen as this can damage your kidney  Take tramadol first for your pain and if this does not help, you can take the oxycodone

## 2012-12-14 DIAGNOSIS — R339 Retention of urine, unspecified: Secondary | ICD-10-CM | POA: Diagnosis not present

## 2012-12-14 DIAGNOSIS — C61 Malignant neoplasm of prostate: Secondary | ICD-10-CM | POA: Diagnosis not present

## 2012-12-14 DIAGNOSIS — N320 Bladder-neck obstruction: Secondary | ICD-10-CM | POA: Diagnosis not present

## 2012-12-30 DIAGNOSIS — N320 Bladder-neck obstruction: Secondary | ICD-10-CM | POA: Diagnosis not present

## 2012-12-30 DIAGNOSIS — E109 Type 1 diabetes mellitus without complications: Secondary | ICD-10-CM | POA: Diagnosis not present

## 2012-12-30 DIAGNOSIS — G4733 Obstructive sleep apnea (adult) (pediatric): Secondary | ICD-10-CM | POA: Diagnosis not present

## 2012-12-30 DIAGNOSIS — I10 Essential (primary) hypertension: Secondary | ICD-10-CM | POA: Diagnosis not present

## 2012-12-30 DIAGNOSIS — D509 Iron deficiency anemia, unspecified: Secondary | ICD-10-CM | POA: Diagnosis not present

## 2012-12-30 DIAGNOSIS — I119 Hypertensive heart disease without heart failure: Secondary | ICD-10-CM | POA: Diagnosis not present

## 2012-12-30 DIAGNOSIS — Z79899 Other long term (current) drug therapy: Secondary | ICD-10-CM | POA: Diagnosis not present

## 2013-01-06 DIAGNOSIS — G4733 Obstructive sleep apnea (adult) (pediatric): Secondary | ICD-10-CM | POA: Diagnosis not present

## 2013-01-06 DIAGNOSIS — Z79899 Other long term (current) drug therapy: Secondary | ICD-10-CM | POA: Diagnosis not present

## 2013-01-06 DIAGNOSIS — N320 Bladder-neck obstruction: Secondary | ICD-10-CM | POA: Diagnosis not present

## 2013-01-06 DIAGNOSIS — D509 Iron deficiency anemia, unspecified: Secondary | ICD-10-CM | POA: Diagnosis not present

## 2013-01-06 DIAGNOSIS — I10 Essential (primary) hypertension: Secondary | ICD-10-CM | POA: Diagnosis not present

## 2013-01-06 DIAGNOSIS — E109 Type 1 diabetes mellitus without complications: Secondary | ICD-10-CM | POA: Diagnosis not present

## 2013-01-11 DIAGNOSIS — T83091A Other mechanical complication of indwelling urethral catheter, initial encounter: Secondary | ICD-10-CM | POA: Diagnosis not present

## 2013-01-11 DIAGNOSIS — R3911 Hesitancy of micturition: Secondary | ICD-10-CM | POA: Diagnosis not present

## 2013-01-11 DIAGNOSIS — Y846 Urinary catheterization as the cause of abnormal reaction of the patient, or of later complication, without mention of misadventure at the time of the procedure: Secondary | ICD-10-CM | POA: Diagnosis not present

## 2013-01-17 DIAGNOSIS — C61 Malignant neoplasm of prostate: Secondary | ICD-10-CM | POA: Diagnosis not present

## 2013-01-17 DIAGNOSIS — N320 Bladder-neck obstruction: Secondary | ICD-10-CM | POA: Diagnosis not present

## 2013-01-18 ENCOUNTER — Encounter: Payer: Self-pay | Admitting: Internal Medicine

## 2013-01-18 ENCOUNTER — Ambulatory Visit (INDEPENDENT_AMBULATORY_CARE_PROVIDER_SITE_OTHER): Payer: 59 | Admitting: Internal Medicine

## 2013-01-18 VITALS — BP 138/82 | HR 72 | Wt 348.0 lb

## 2013-01-18 DIAGNOSIS — N058 Unspecified nephritic syndrome with other morphologic changes: Secondary | ICD-10-CM

## 2013-01-18 DIAGNOSIS — E1129 Type 2 diabetes mellitus with other diabetic kidney complication: Secondary | ICD-10-CM

## 2013-01-18 DIAGNOSIS — I10 Essential (primary) hypertension: Secondary | ICD-10-CM

## 2013-01-18 DIAGNOSIS — E785 Hyperlipidemia, unspecified: Secondary | ICD-10-CM

## 2013-01-18 NOTE — Progress Notes (Signed)
Patient ID: Thomas [name], male   DOB: [birth year], 62 y.o.   MRN: [id number]  Chief Complaint  Patient presents with  . Medical Managment of Chronic Issues    1 month follow-up on blood pressure    Allergies  Allergen Reactions  . Atorvastatin Other (See Comments)    Dizziness/faint feeling    HPI 62 y/o male patient is here for follow up on his blood pressure. His blood pressure has been well controlled. He denies any headache, blurry vision, abdominal or chest.  No other concerns this visit  Review of Systems   Constitutional: Negative for fever, chills and appetite change.   HENT: Negative for ear pain, congestion and ear discharge.    Eyes: Negative for pain and visual disturbance.        Wears corrective lenses and follows with dr Erma Heritage   Respiratory: Negative for cough, shortness of breath and wheezing.    Cardiovascular: Negative for chest pain, palpitations and leg swelling.   Gastrointestinal: no abdominal pain no constipation Genitourinary: Positive for frequency. Negative for flank pain.        Self catheterizes for 3 years. History of prostate cancer s/p seed treatment Nocturia present Follows with dr Shelby Dubin urology   Musculoskeletal: Positive for arthralgias. Negative for joint swelling.   Skin: Negative for rash and wound.   Allergic/Immunologic: Positive for environmental allergies.   Neurological: Negative for dizziness, tremors, syncope, weakness, light-headedness and headaches.   Hematological: Negative for adenopathy.   Psychiatric/Behavioral: Negative for suicidal ideas, behavioral problems, sleep disturbance, self-injury and agitation.    Of note: Reviewed records from [facility]- history of prostate cancer s/p brachytherapy3/2010, stage T1c, gleason 6, PSA 8. He does intermittent catheterization. He has also undergone balloon dilatation of urethral stricture in past Reviewed records from Dr Wayland Denis from [facility]- uncontrolled DM, HTN, obesity were chronic medical problems.  Past Medical History  Diagnosis Date  . Diabetes mellitus   . Hypertension   . Arthritis   . Chronic kidney disease   . Cancer 2010    Prostate   Past Surgical History  Procedure Laterality Date  . Knee surgery    . Shoulder surgery    . Hernia repair    . Prostate surgery    . Uretha surgery-2014    . Radioactive seed implant      Physical exam BP 138/82  Pulse 72  Wt 348 lb (157.852 kg)  SpO2 95%  Constitutional: He is oriented to person, place, and time. No distress.  Morbidly obese   HENT:   Head: Normocephalic and atraumatic.   Mouth/Throat: Oropharynx is clear and moist.   Eyes: Pupils are equal, round, and reactive to light.   Neck: Normal range of motion. Neck supple. No JVD present. No tracheal deviation present. No thyromegaly present.   Cardiovascular: Normal rate and regular rhythm.    Pulmonary/Chest: Effort normal and breath sounds normal.  tenderness in lateral 4th intercostal area on palpation. Reproducible pain Abdominal: Soft. Bowel sounds are normal. There is no tenderness.  Musculoskeletal: Normal range of motion. He exhibits no edema.  Crepitus present in both knee joint, ROM in LE limited with body habitus, strength adequate  Lymphadenopathy:    He has no cervical adenopathy.  Neurological: He is alert and oriented to person, place, and time. No cranial nerve deficit.   Skin: Skin is warm and dry. He is not diaphoretic. No skin lesion noted Psychiatric: He has a normal mood  and affect.    ASSESSMENT/PLAN  Hypertension- bp is well controlled today. Continue lisinopril 40 mg daily, hctz 25 mg daily, coreg 6.25 mg bid and amlodipine 10 mg daily. Continue home food and low salt diet.   Dm type 2-  Lab Results  Component Value Date   HGBA1C 8.2* 11/29/2012   Lab Results  Component Value Date   CREATININE 1.05 11/29/2012   cbg 117-169. Continue metformin 1000 mg  bid and tradjenta 5 mg daily. Continue glucotrol 10 mg daily. Has microalbuminuria. Continue liisnopril. Continue asa, lipitor. uptodate with pneumococcal vaccine. Does not want influenza vaccine  Hyperlipidemia- Lipid Panel     Component Value Date/Time   TRIG 121 08/24/2012 1012   HDL 53 08/24/2012 1012   CHOLHDL 4.0 08/24/2012 1012   LDLCALC 137* 08/24/2012 1012   Continue lipitor 10 mg daily, pt has occassional dizziness from this and suggested to take it at night time

## 2013-01-23 ENCOUNTER — Other Ambulatory Visit: Payer: Self-pay | Admitting: Internal Medicine

## 2013-02-28 DIAGNOSIS — N35919 Unspecified urethral stricture, male, unspecified site: Secondary | ICD-10-CM | POA: Diagnosis not present

## 2013-03-09 DIAGNOSIS — C61 Malignant neoplasm of prostate: Secondary | ICD-10-CM | POA: Diagnosis not present

## 2013-04-13 ENCOUNTER — Other Ambulatory Visit: Payer: 59

## 2013-04-18 ENCOUNTER — Ambulatory Visit: Payer: 59 | Admitting: Internal Medicine

## 2013-04-25 ENCOUNTER — Other Ambulatory Visit: Payer: 59

## 2013-04-25 DIAGNOSIS — E785 Hyperlipidemia, unspecified: Secondary | ICD-10-CM

## 2013-04-25 DIAGNOSIS — E1165 Type 2 diabetes mellitus with hyperglycemia: Principal | ICD-10-CM

## 2013-04-25 DIAGNOSIS — E1129 Type 2 diabetes mellitus with other diabetic kidney complication: Secondary | ICD-10-CM

## 2013-04-25 DIAGNOSIS — I10 Essential (primary) hypertension: Secondary | ICD-10-CM

## 2013-04-25 DIAGNOSIS — IMO0002 Reserved for concepts with insufficient information to code with codable children: Secondary | ICD-10-CM

## 2013-04-26 LAB — HEPATIC FUNCTION PANEL
ALT: 10 IU/L (ref 0–44)
AST: 12 IU/L (ref 0–40)
Albumin: 4.2 g/dL (ref 3.6–4.8)
Alkaline Phosphatase: 79 IU/L (ref 39–117)
Bilirubin, Direct: 0.14 mg/dL (ref 0.00–0.40)
Total Bilirubin: 0.5 mg/dL (ref 0.0–1.2)
Total Protein: 7.2 g/dL (ref 6.0–8.5)

## 2013-04-26 LAB — LIPID PANEL
Chol/HDL Ratio: 4.4 ratio units (ref 0.0–5.0)
Cholesterol, Total: 215 mg/dL — ABNORMAL HIGH (ref 100–199)
HDL: 49 mg/dL (ref >39)
LDL Calculated: 144 mg/dL — ABNORMAL HIGH (ref 0–99)
Triglycerides: 109 mg/dL (ref 0–149)
VLDL Cholesterol Cal: 22 mg/dL (ref 5–40)

## 2013-04-26 LAB — HEMOGLOBIN A1C
Est. average glucose Bld gHb Est-mCnc: 206 mg/dL
Hgb A1c MFr Bld: 8.8 % — ABNORMAL HIGH (ref 4.8–5.6)

## 2013-04-26 LAB — BASIC METABOLIC PANEL
BUN/Creatinine Ratio: 10 (ref 10–22)
BUN: 10 mg/dL (ref 8–27)
CO2: 22 mmol/L (ref 18–29)
Calcium: 9.3 mg/dL (ref 8.6–10.2)
Chloride: 98 mmol/L (ref 97–108)
Creatinine, Ser: 0.98 mg/dL (ref 0.76–1.27)
GFR calc Af Amer: 95 mL/min/{1.73_m2} (ref >59)
GFR calc non Af Amer: 82 mL/min/{1.73_m2} (ref >59)
Glucose: 259 mg/dL — ABNORMAL HIGH (ref 65–99)
Potassium: 3.5 mmol/L (ref 3.5–5.2)
Sodium: 142 mmol/L (ref 134–144)

## 2013-05-02 ENCOUNTER — Encounter: Payer: Self-pay | Admitting: Internal Medicine

## 2013-05-02 ENCOUNTER — Ambulatory Visit (INDEPENDENT_AMBULATORY_CARE_PROVIDER_SITE_OTHER): Payer: 59 | Admitting: Internal Medicine

## 2013-05-02 ENCOUNTER — Other Ambulatory Visit: Payer: Self-pay | Admitting: Internal Medicine

## 2013-05-02 VITALS — BP 158/90 | HR 68 | Resp 12 | Wt 337.8 lb

## 2013-05-02 DIAGNOSIS — I10 Essential (primary) hypertension: Secondary | ICD-10-CM

## 2013-05-02 DIAGNOSIS — E1129 Type 2 diabetes mellitus with other diabetic kidney complication: Secondary | ICD-10-CM | POA: Diagnosis not present

## 2013-05-02 DIAGNOSIS — E785 Hyperlipidemia, unspecified: Secondary | ICD-10-CM

## 2013-05-02 DIAGNOSIS — M179 Osteoarthritis of knee, unspecified: Secondary | ICD-10-CM

## 2013-05-02 DIAGNOSIS — IMO0002 Reserved for concepts with insufficient information to code with codable children: Secondary | ICD-10-CM

## 2013-05-02 DIAGNOSIS — E1165 Type 2 diabetes mellitus with hyperglycemia: Secondary | ICD-10-CM | POA: Diagnosis not present

## 2013-05-02 DIAGNOSIS — M1710 Unilateral primary osteoarthritis, unspecified knee: Secondary | ICD-10-CM

## 2013-05-02 MED ORDER — TRAMADOL HCL 50 MG PO TABS: 50.0000 mg | ORAL_TABLET | Freq: Three times a day (TID) | ORAL | Status: DC | PRN

## 2013-05-02 MED ORDER — ATORVASTATIN CALCIUM 10 MG PO TABS: 10.0000 mg | ORAL_TABLET | Freq: Every day | ORAL | Status: DC

## 2013-05-02 MED ORDER — METFORMIN HCL 500 MG PO TABS: 500.0000 mg | ORAL_TABLET | Freq: Two times a day (BID) | ORAL | Status: DC

## 2013-05-02 MED ORDER — CARVEDILOL 6.25 MG PO TABS: 6.2500 mg | ORAL_TABLET | Freq: Two times a day (BID) | ORAL | Status: DC

## 2013-05-02 MED ORDER — GLIPIZIDE 10 MG PO TABS: 10.0000 mg | ORAL_TABLET | Freq: Every day | ORAL | Status: DC

## 2013-05-02 NOTE — Progress Notes (Signed)
Patient ID: Thomas [name], male   DOB: 12-24-1950, 63 y.o.   MRN: [id number]    Chief Complaint  Patient presents with  . Medical Managment of Chronic Issues    3 month follow-up   . Knee Pain    Left knee pain x 1 month    Allergies  Allergen Reactions  . Atorvastatin Other (See Comments)    Dizziness/faint feeling    HPI 63 y/o male pt with history of morbid obesity, HTN, DM and hyperlipidemia is here for routine folow up. Reviewed his lab work. He has been going to [facility] 3 times a week and has been exercising. He has been doing water aerobics and lost some weight He has been having pain in left knee x 1 month. He has history of bilateral knee arthroscopy and left knee reconstruction surgery in 1985. He has been having problem extending at his knee joint He has not checked his blood sugar for 4 days now He is under stress with his family at present and has not been able to take care of himself. He stopped taking atorvastatin and carvedilol because he felt lightheaded. He takes all his medications in the morning together  Review of Systems   Constitutional: Negative for fever, chills and appetite change.   HENT: Negative for ear pain, congestion and ear discharge.    Eyes: Negative for pain and visual disturbance.        Wears corrective lenses and follows with dr Bethanne Ginger   Respiratory: Negative for cough, shortness of breath and wheezing.    Cardiovascular: Negative for chest pain, palpitations and leg swelling.   Gastrointestinal: no abdominal pain no constipation Genitourinary:  Negative for flank pain and dysuria        Self catheterizes for 3 years. History of prostate cancer s/p seed treatment Nocturia present Follows with dr Jonette Eva urology   Musculoskeletal: Positive for arthritis Negative for joint swelling.   Skin: Negative for rash and wound.   Neurological: Negative for dizziness, tremors, syncope, weakness, light-headedness and headaches.   Hematological:  Negative for adenopathy.   Psychiatric/Behavioral: Negative for suicidal ideas, behavioral problems, sleep disturbance, self-injury and agitation.   Past Medical History  Diagnosis Date  . Diabetes mellitus   . Hypertension   . Arthritis   . Chronic kidney disease   . Cancer 2010    Prostate   Past Surgical History  Procedure Laterality Date  . Knee surgery    . Shoulder surgery    . Hernia repair    . Prostate surgery    . Uretha surgery-2014    . Radioactive seed implant     Current Outpatient Prescriptions on File Prior to Visit  Medication Sig Dispense Refill  . amLODipine (NORVASC) 10 MG tablet Take 1 tablet (10 mg total) by mouth daily.  90 tablet  3  . aspirin 81 MG tablet Take 81 mg by mouth daily.      . hydrochlorothiazide (HYDRODIURIL) 25 MG tablet Take 1 tablet (25 mg total) by mouth daily.  30 tablet  3  . iron polysaccharides (NIFEREX) 150 MG capsule Take 1 capsule (150 mg total) by mouth 2 (two) times daily.  60 capsule  3  . linagliptin (TRADJENTA) 5 MG TABS tablet Take 1 tablet (5 mg total) by mouth daily.  90 tablet  3  . lisinopril (PRINIVIL,ZESTRIL) 40 MG tablet Take 1 tablet (40 mg total) by mouth daily. Take one tablet once daily for blood pressure  90 tablet  3  . oxyCODONE-acetaminophen (ROXICET) 5-325 MG per tablet Take 1 tablet by mouth every 12 (twelve) hours as needed for pain.  20 tablet  0  . vitamin B-12 (CYANOCOBALAMIN) 1000 MCG tablet Take 1,000 mcg by mouth daily.       No current facility-administered medications on file prior to visit.   History reviewed. No pertinent family history.  Past Surgical History  Procedure Laterality Date  . Knee surgery    . Shoulder surgery    . Hernia repair    . Prostate surgery    . Uretha surgery-2014    . Radioactive seed implant     Physical exam BP 158/90  Pulse 68  Resp 12  Wt 337 lb 12.8 oz (153.225 kg)  SpO2 98%  Constitutional: He is oriented to person, place, and time. No  distress.Morbidly obese   HENT:   Head: Normocephalic and atraumatic.   Mouth/Throat: Oropharynx is clear and moist.   Eyes: Pupils are equal, round, and reactive to light.   Neck: Normal range of motion. Neck supple. No JVD present. No tracheal deviation present. No thyromegaly present.   Cardiovascular: Normal rate and regular rhythm.    Pulmonary/Chest: Effort normal and breath sounds normal.  tenderness in lateral 4th intercostal area on palpation. Reproducible pain Abdominal: Soft. Bowel sounds are normal. There is no tenderness.  Musculoskeletal: Normal range of motion. He exhibits no edema.  Crepitus present in both knee joint, ROM in LE limited with body habitus in left > right, strength adequate  Lymphadenopathy:    He has no cervical adenopathy.  Neurological: He is alert and oriented to person, place, and time. No cranial nerve deficit.   Skin: Skin is warm and dry. He is not diaphoretic. No skin lesion noted Psychiatric: He has a normal mood and affect.    Labs- CBC    Component Value Date/Time   WBC 9.4 08/24/2012 1012   WBC 8.0 12/06/2009 1045   RBC 4.86 08/24/2012 1012   RBC 4.07* 12/06/2009 1045   HGB 13.3 08/24/2012 1012   HGB 13.4 01/13/2008 1046   HCT 39.8 08/24/2012 1012   HCT 40.0 01/13/2008 1046   PLT 290 12/06/2009 1045   PLT 252 01/13/2008 1046   MCV 82 08/24/2012 1012   MCV 83 01/13/2008 1046   MCH 27.4 08/24/2012 1012   MCH 26.5 12/06/2009 1045   MCH 27.9* 01/13/2008 1046   MCHC 33.4 08/24/2012 1012   MCHC 31.6 12/06/2009 1045   MCHC 33.5 01/13/2008 1046   RDW 14.5 08/24/2012 1012   RDW 14.5 12/06/2009 1045   RDW 12.1 01/13/2008 1046   LYMPHSABS 2.5 08/24/2012 1012   LYMPHSABS 1.3 12/06/2009 1045   LYMPHSABS 2.3 01/13/2008 1046   MONOABS 0.4 12/06/2009 1045   EOSABS 0.0 08/24/2012 1012   EOSABS 0.0 12/06/2009 1045   EOSABS 0.1 01/13/2008 1046   BASOSABS 0.0 08/24/2012 1012   BASOSABS 0.0 12/06/2009 1045   BASOSABS 0.1 01/13/2008 1046    Lab Results    Component Value Date   HGBA1C 8.8* 04/25/2013   CMP     Component Value Date/Time   NA 142 04/25/2013 0850   NA 139 11/16/2011 1427   K 3.5 04/25/2013 0850   CL 98 04/25/2013 0850   CO2 22 04/25/2013 0850   GLUCOSE 259* 04/25/2013 0850   GLUCOSE 328* 11/16/2011 1427   BUN 10 04/25/2013 0850   BUN 10 11/16/2011 1427   CREATININE 0.98 04/25/2013 0850   CALCIUM 9.3 04/25/2013  0850   PROT 7.2 04/25/2013 0850   PROT 7.2 12/06/2009 1045   ALBUMIN 3.3* 12/06/2009 1045   AST 12 04/25/2013 0850   ALT 10 04/25/2013 0850   ALKPHOS 79 04/25/2013 0850   BILITOT 0.5 04/25/2013 0850   GFRNONAA 82 04/25/2013 0850   GFRAA 95 04/25/2013 0850   Lipid Panel     Component Value Date/Time   TRIG 109 04/25/2013 0850   HDL 49 04/25/2013 0850   CHOLHDL 4.4 04/25/2013 0850   LDLCALC 144* 04/25/2013 0850    Assessment/plan  1. HTN (hypertension) Elevated bp readings. Will space out his bp medication for now and resume carvedilol. Will have him take his HCTZ in am wigth lisinopril and amlodipine in pm. Also to take carvedilol bid. Check bp at home. Asked to bring bp machine next visit in 2 weeks to reassess bp readings. Explained about warning sign  2. DM type 2, uncontrolled, with renal complications Reviewed F0O. No cbg check recently at home. Asked to check cbg readings. Also to check cbg readings when he feels lightheaded to rule out hypoglycemia/ hyperglycemia. Continue metformin with glipizide and tradjenta. Reviewed renal function. Normal foot exam. Continue ACEI and statin. Has microalbuminuria and uptodate with pneumococcal and influenza immunization  3. Osteoarthritis, knee Refill on tramadol provided. Will refer to orthopedic for possible need for TKA - Ambulatory referral to Orthopedic Surgery  4. Morbid obesity Has been exercising and lost few pounds. Congratulated on this. Encouraged to continue exercise. Dietary counselling provided  5. Hyperlipidemia LDL goal < 100 With dizziness being unlikely  side effect of lipitor, pt assured to resume it. Explained complications with elevated cholesterol and need for med compliance

## 2013-05-02 NOTE — Patient Instructions (Signed)
Make sure to bring your blood pressure machine and glucometer next visit. Check sugar and bp reading once a day. dont stop your medication by yourself

## 2013-05-04 DIAGNOSIS — M1710 Unilateral primary osteoarthritis, unspecified knee: Secondary | ICD-10-CM | POA: Diagnosis not present

## 2013-05-12 DIAGNOSIS — M1710 Unilateral primary osteoarthritis, unspecified knee: Secondary | ICD-10-CM | POA: Diagnosis not present

## 2013-05-16 ENCOUNTER — Ambulatory Visit: Payer: 59 | Admitting: Internal Medicine

## 2013-05-23 ENCOUNTER — Encounter: Payer: Self-pay | Admitting: Internal Medicine

## 2013-05-23 ENCOUNTER — Ambulatory Visit (INDEPENDENT_AMBULATORY_CARE_PROVIDER_SITE_OTHER): Payer: 59 | Admitting: Internal Medicine

## 2013-05-23 VITALS — BP 160/78 | HR 58 | Temp 98.1°F | Wt 335.6 lb

## 2013-05-23 DIAGNOSIS — IMO0002 Reserved for concepts with insufficient information to code with codable children: Secondary | ICD-10-CM

## 2013-05-23 DIAGNOSIS — E538 Deficiency of other specified B group vitamins: Secondary | ICD-10-CM

## 2013-05-23 DIAGNOSIS — E1129 Type 2 diabetes mellitus with other diabetic kidney complication: Secondary | ICD-10-CM

## 2013-05-23 DIAGNOSIS — I10 Essential (primary) hypertension: Secondary | ICD-10-CM

## 2013-05-23 DIAGNOSIS — E1165 Type 2 diabetes mellitus with hyperglycemia: Principal | ICD-10-CM

## 2013-05-23 DIAGNOSIS — Z23 Encounter for immunization: Secondary | ICD-10-CM

## 2013-05-23 MED ORDER — TETANUS-DIPHTHERIA TOXOIDS TD 2-2 LF/0.5ML IM SUSP: 0.5000 mL | Freq: Once | INTRAMUSCULAR | Status: DC

## 2013-05-23 NOTE — Progress Notes (Signed)
Patient ID: Thomas [name], male   DOB: June 14, 1950, 63 y.o.   MRN: [id number]    Chief Complaint  Patient presents with  . Follow-up    f/u HTN/DM & discuss labs (printed)  . Immunizations    print Tdap   No Known Allergies   HPI 63 y/o male patient with history of morbid obesity, HTN, DM and hyperlipidemia is here for follow up on his HTN and DM. He lost his mother on 05/21/13 and is quiet and feeling low today. He has been busy with arrangements for funeral and has not taken his medication today. Reviewed his lab work. cbg this am was not checked. Last night cbg 169   Review of Systems   Constitutional: Negative for fever, chills and appetite change.   HENT: Negative for ear pain, congestion and ear discharge.    Eyes: Negative for pain and visual disturbance.        Wears corrective lenses and follows with dr Bethanne Ginger   Respiratory: Negative for cough, shortness of breath and wheezing.    Cardiovascular: Negative for chest pain, palpitations and leg swelling.   Gastrointestinal: no abdominal pain no constipation Genitourinary:  Negative for flank pain and dysuria. Self catheterizes and follows with Dr Cecilie Lowers from urology, has hx of prostate cancer s/p seed treatment Musculoskeletal: Positive for arthritis Negative for joint swelling.   Skin: Negative for rash and wound.   Neurological: Negative for dizziness, tremors, syncope, weakness, light-headedness and headaches.   Hematological: Negative for adenopathy.   Psychiatric/Behavioral: Negative for suicidal ideas, behavioral problems, sleep disturbance, self-injury and agitation.     Past Medical History  Diagnosis Date  . Diabetes mellitus   . Hypertension   . Arthritis   . Chronic kidney disease   . Cancer 2010    Prostate   Past Surgical History  Procedure Laterality Date  . Knee surgery    . Shoulder surgery    . Hernia repair    . Prostate surgery    . Uretha surgery-2014    . Radioactive seed implant      Current Outpatient Prescriptions on File Prior to Visit  Medication Sig Dispense Refill  . amLODipine (NORVASC) 10 MG tablet Take 1 tablet (10 mg total) by mouth daily.  90 tablet  3  . aspirin 81 MG tablet Take 81 mg by mouth daily.      Marland Kitchen atorvastatin (LIPITOR) 10 MG tablet Take 1 tablet (10 mg total) by mouth daily.  90 tablet  3  . carvedilol (COREG) 6.25 MG tablet Take 1 tablet (6.25 mg total) by mouth 2 (two) times daily with a meal.  60 tablet  3  . glipiZIDE (GLUCOTROL) 10 MG tablet Take 1 tablet (10 mg total) by mouth daily before breakfast.  90 tablet  3  . hydrochlorothiazide (HYDRODIURIL) 25 MG tablet Take 1 tablet (25 mg total) by mouth daily.  30 tablet  3  . iron polysaccharides (NIFEREX) 150 MG capsule Take 1 capsule (150 mg total) by mouth 2 (two) times daily.  60 capsule  3  . linagliptin (TRADJENTA) 5 MG TABS tablet Take 1 tablet (5 mg total) by mouth daily.  90 tablet  3  . lisinopril (PRINIVIL,ZESTRIL) 40 MG tablet Take 1 tablet (40 mg total) by mouth daily. Take one tablet once daily for blood pressure  90 tablet  3  . metFORMIN (GLUCOPHAGE) 500 MG tablet Take 1 tablet (500 mg total) by mouth 2 (two) times daily with a meal.  60 tablet  3  . oxyCODONE-acetaminophen (ROXICET) 5-325 MG per tablet Take 1 tablet by mouth every 12 (twelve) hours as needed for pain.  20 tablet  0  . traMADol (ULTRAM) 50 MG tablet Take 1 tablet (50 mg total) by mouth every 8 (eight) hours as needed.  90 tablet  0  . vitamin B-12 (CYANOCOBALAMIN) 1000 MCG tablet Take 1,000 mcg by mouth daily.       No current facility-administered medications on file prior to visit.    Physical exam BP 160/78  Pulse 58  Temp(Src) 98.1 F (36.7 C) (Oral)  Wt 335 lb 9.6 oz (152.227 kg)  SpO2 98%  Constitutional: He is oriented to person, place, and time. No distress. obese   HENT:   Head: Normocephalic and atraumatic.   Mouth/Throat: Oropharynx is clear and moist.   Eyes: Pupils are equal, round, and  reactive to light.   Neck: Normal range of motion. Neck supple. No JVD present. No tracheal deviation present. No thyromegaly present.   Cardiovascular: Normal rate and regular rhythm.    Pulmonary/Chest: Effort normal and breath sounds normal.  tenderness in lateral 4th intercostal area on palpation. Reproducible pain Abdominal: Soft. Bowel sounds are normal. There is no tenderness.  Musculoskeletal: Normal range of motion. He exhibits no edema.  Crepitus present in both knee joint, ROM in LE limited with body habitus in left > right, strength adequate  Lymphadenopathy:    He has no cervical adenopathy.  Neurological: He is alert and oriented to person, place, and time. No cranial nerve deficit.   Skin: Skin is warm and dry. He is not diaphoretic. No skin lesion noted Psychiatric: He has a normal mood and affect.   Labs- Lab Results  Component Value Date   WBC 9.4 08/24/2012   HGB 13.3 08/24/2012   HCT 39.8 08/24/2012   MCV 82 08/24/2012   PLT 290 12/06/2009    CMP     Component Value Date/Time   NA 142 04/25/2013 0850   NA 139 11/16/2011 1427   K 3.5 04/25/2013 0850   CL 98 04/25/2013 0850   CO2 22 04/25/2013 0850   GLUCOSE 259* 04/25/2013 0850   GLUCOSE 328* 11/16/2011 1427   BUN 10 04/25/2013 0850   BUN 10 11/16/2011 1427   CREATININE 0.98 04/25/2013 0850   CALCIUM 9.3 04/25/2013 0850   PROT 7.2 04/25/2013 0850   PROT 7.2 12/06/2009 1045   ALBUMIN 3.3* 12/06/2009 1045   AST 12 04/25/2013 0850   ALT 10 04/25/2013 0850   ALKPHOS 79 04/25/2013 0850   BILITOT 0.5 04/25/2013 0850   GFRNONAA 82 04/25/2013 0850   GFRAA 95 04/25/2013 0850    Lab Results  Component Value Date   HGBA1C 8.8* 04/25/2013    Lipid Panel     Component Value Date/Time   TRIG 109 04/25/2013 0850   HDL 49 04/25/2013 0850   CHOLHDL 4.4 04/25/2013 0850   LDLCALC 144* 04/25/2013 0850   Assessment/plan  1. Need for prophylactic vaccination with combined diphtheria-tetanus-pertussis (DTP) vaccine - diptheria-tetanus  toxoids (DECAVAC) 2-2 LF/0.5ML injection; Inject 0.5 mLs into the muscle once.  Dispense: 0.5 mL; Refill: 0  2. DM type 2, uncontrolled, with renal complications Continue metformin 500 mg bid with glipizide 10 mg daily and tradjenta 5 mg daily. Reviewed renal function. Normal foot exam. Continue ACEI and statin. Has microalbuminuria and uptodate with pneumococcal and influenza immunization, monitor for hypoglycemia - Hemoglobin A1c; Future  3. HTN (hypertension) Reviewed home readings  mostly above 140 SBP and DBP above 90. Pt has been missing his medications at times. This way, it becomes difficult to assess for efficacy of the medication. Explained the importance of compliance and looking out for warning signs with elevated readings. Continue home bp monitor. Refills n medication provided and compliance reinforced. To take his amlodipine 10 mg daily, coreg 6.25 mg bid, hctz 25 mg daily and lisinopril 40 mg daily - Basic Metabolic Panel; Future  4. B12 deficiency Check b12 prior to next visit. Patient would like to take injections instead of pill due to ease of monthly injections.will decide on this after reviewing b12 level - Vitamin B12; Future

## 2013-06-06 DIAGNOSIS — M1710 Unilateral primary osteoarthritis, unspecified knee: Secondary | ICD-10-CM | POA: Diagnosis not present

## 2013-06-21 ENCOUNTER — Other Ambulatory Visit: Payer: Self-pay | Admitting: *Deleted

## 2013-06-21 MED ORDER — HYDROCHLOROTHIAZIDE 25 MG PO TABS: ORAL_TABLET | ORAL | Status: DC

## 2013-06-21 NOTE — Telephone Encounter (Signed)
[facility].  

## 2013-07-03 ENCOUNTER — Encounter: Payer: Self-pay | Admitting: Physical Medicine & Rehabilitation

## 2013-07-04 ENCOUNTER — Other Ambulatory Visit: Payer: Self-pay | Admitting: *Deleted

## 2013-07-04 ENCOUNTER — Other Ambulatory Visit: Payer: Self-pay | Admitting: Orthopaedic Surgery

## 2013-07-04 DIAGNOSIS — N320 Bladder-neck obstruction: Secondary | ICD-10-CM | POA: Diagnosis not present

## 2013-07-04 DIAGNOSIS — C61 Malignant neoplasm of prostate: Secondary | ICD-10-CM | POA: Insufficient documentation

## 2013-07-04 DIAGNOSIS — M79606 Pain in leg, unspecified: Secondary | ICD-10-CM

## 2013-07-04 MED ORDER — GLUCOSE BLOOD VI STRP: ORAL_STRIP | Status: DC

## 2013-07-04 MED ORDER — SM SUPER THIN LANCETS 30G MISC: Status: DC

## 2013-07-04 NOTE — Telephone Encounter (Signed)
[facility].  

## 2013-07-04 NOTE — Telephone Encounter (Signed)
[facility].  

## 2013-07-05 ENCOUNTER — Encounter (INDEPENDENT_AMBULATORY_CARE_PROVIDER_SITE_OTHER): Payer: Self-pay

## 2013-07-05 ENCOUNTER — Ambulatory Visit
Admission: RE | Admit: 2013-07-05 | Discharge: 2013-07-05 | Disposition: A | Discharge status: Home / Self Care | Payer: 59 | Source: Ambulatory Visit | Attending: Orthopaedic Surgery | Admitting: Orthopaedic Surgery

## 2013-07-05 DIAGNOSIS — M79609 Pain in unspecified limb: Secondary | ICD-10-CM | POA: Diagnosis not present

## 2013-07-05 DIAGNOSIS — M7989 Other specified soft tissue disorders: Secondary | ICD-10-CM | POA: Diagnosis not present

## 2013-07-05 DIAGNOSIS — M79606 Pain in leg, unspecified: Secondary | ICD-10-CM

## 2013-07-26 DIAGNOSIS — N35919 Unspecified urethral stricture, male, unspecified site: Secondary | ICD-10-CM | POA: Insufficient documentation

## 2013-07-26 DIAGNOSIS — G4730 Sleep apnea, unspecified: Secondary | ICD-10-CM | POA: Insufficient documentation

## 2013-07-26 DIAGNOSIS — G4733 Obstructive sleep apnea (adult) (pediatric): Secondary | ICD-10-CM | POA: Insufficient documentation

## 2013-07-26 DIAGNOSIS — R351 Nocturia: Secondary | ICD-10-CM | POA: Insufficient documentation

## 2013-07-26 DIAGNOSIS — R339 Retention of urine, unspecified: Secondary | ICD-10-CM | POA: Insufficient documentation

## 2013-07-26 DIAGNOSIS — R82998 Other abnormal findings in urine: Secondary | ICD-10-CM | POA: Insufficient documentation

## 2013-08-14 ENCOUNTER — Encounter: Payer: 59 | Attending: Physical Medicine & Rehabilitation

## 2013-08-14 ENCOUNTER — Ambulatory Visit (HOSPITAL_BASED_OUTPATIENT_CLINIC_OR_DEPARTMENT_OTHER): Payer: 59 | Admitting: Physical Medicine & Rehabilitation

## 2013-08-14 ENCOUNTER — Encounter: Payer: Self-pay | Admitting: Physical Medicine & Rehabilitation

## 2013-08-14 VITALS — BP 160/90 | HR 75 | Resp 16 | Ht 69.0 in | Wt 340.0 lb

## 2013-08-14 DIAGNOSIS — Z5181 Encounter for therapeutic drug level monitoring: Secondary | ICD-10-CM | POA: Diagnosis not present

## 2013-08-14 DIAGNOSIS — N189 Chronic kidney disease, unspecified: Secondary | ICD-10-CM | POA: Insufficient documentation

## 2013-08-14 DIAGNOSIS — M238X9 Other internal derangements of unspecified knee: Secondary | ICD-10-CM

## 2013-08-14 DIAGNOSIS — I129 Hypertensive chronic kidney disease with stage 1 through stage 4 chronic kidney disease, or unspecified chronic kidney disease: Secondary | ICD-10-CM | POA: Insufficient documentation

## 2013-08-14 DIAGNOSIS — M2350 Chronic instability of knee, unspecified knee: Secondary | ICD-10-CM

## 2013-08-14 DIAGNOSIS — M25569 Pain in unspecified knee: Secondary | ICD-10-CM | POA: Insufficient documentation

## 2013-08-14 DIAGNOSIS — Z79899 Other long term (current) drug therapy: Secondary | ICD-10-CM

## 2013-08-14 DIAGNOSIS — Z8546 Personal history of malignant neoplasm of prostate: Secondary | ICD-10-CM | POA: Insufficient documentation

## 2013-08-14 DIAGNOSIS — M172 Bilateral post-traumatic osteoarthritis of knee: Secondary | ICD-10-CM

## 2013-08-14 DIAGNOSIS — M175 Other unilateral secondary osteoarthritis of knee: Secondary | ICD-10-CM | POA: Diagnosis not present

## 2013-08-14 DIAGNOSIS — E119 Type 2 diabetes mellitus without complications: Secondary | ICD-10-CM | POA: Insufficient documentation

## 2013-08-14 DIAGNOSIS — M1710 Unilateral primary osteoarthritis, unspecified knee: Secondary | ICD-10-CM | POA: Diagnosis not present

## 2013-08-14 MED ORDER — DICLOFENAC SODIUM 1 % TD GEL: 2.0000 g | Freq: Four times a day (QID) | TRANSDERMAL | Status: DC

## 2013-08-14 MED ORDER — TRAMADOL HCL 50 MG PO TABS: 50.0000 mg | ORAL_TABLET | Freq: Three times a day (TID) | ORAL | Status: DC | PRN

## 2013-08-14 NOTE — Progress Notes (Signed)
Subjective:    Patient ID: Thomas [name], male    DOB: 11/08/1950, 63 y.o.   MRN: [id number]  HPI CC:  Bilateral knee pain  History of left anterior cruciate ligament reconstruction 1985. Had arthroscopic surgery prior to that. The anterior cruciate ligament reconstruction was an open procedure. Right knee had arthroscopic surgery but no anterior cruciate ligament reconstruction.  Patient has followed up recently with Dr.Xu from [place] [facility] Has tried visco supplementation, cortisone injections which have not been helpful. Has tried  pepper cream, has not tried diclofenac gel  Knee replacement has been recommended however because of diabetes and hypertension which is poorly controlled not a good candidate at this time. Weight is up. Stressed recently.  Review of systems: Intermittent catheterization for urine. Has had problems with this since prostate cancer.  Vocational: On long term disability since 2010, multiple factors including history of prostate carcinoma, knee problems, left rotator cuff tear and other issues Pain Inventory Average Pain 7 Pain Right Now 7 My pain is sharp, stabbing and aching  In the last 24 hours, has pain interfered with the following? General activity 10 Relation with others 6 Enjoyment of life 9 What TIME of day is your pain at its worst? constant Sleep (in general) Fair  Pain is worse with: walking, bending, sitting and standing Pain improves with: rest and medication Relief from Meds: 5  Mobility walk without assistance use a cane how many minutes can you walk? 5 ability to climb steps?  yes do you drive?  yes Do you have any goals in this area?  yes  Function disabled: date disabled 05/2008 I need assistance with the following:  meal prep, household duties and shopping  Neuro/Psych bladder control problems numbness tingling trouble walking depression  Prior Studies x-rays ultrasound  Physicians involved in  your care Primary care . Neurologist . Orthopedist .   History reviewed. No pertinent family history. History   Social History  . Marital Status: Married    Spouse Name: N/A    Number of Children: N/A  . Years of Education: N/A   Social History Main Topics  . Smoking status: Never Smoker   . Smokeless tobacco: Never Used  . Alcohol Use: No     Comment: never  . Drug Use: No  . Sexual Activity: None   Other Topics Concern  . None   Social History Narrative  . None   Past Surgical History  Procedure Laterality Date  . Knee surgery    . Shoulder surgery    . Hernia repair    . Prostate surgery    . Uretha surgery-2014    . Radioactive seed implant     Past Medical History  Diagnosis Date  . Diabetes mellitus   . Hypertension   . Arthritis   . Chronic kidney disease   . Cancer 2010    Prostate   BP 160/90  Pulse 75  Resp 16  Ht 5\' 9"  (1.753 m)  Wt 340 lb (154.223 kg)  BMI 50.19 kg/m2  SpO2 95%  Opioid Risk Score: 1 Fall Risk Score: Low Fall Risk (0-5 points) (patient educated handout given)   Review of Systems  Constitutional: Positive for unexpected weight change.  Respiratory: Positive for apnea.   Gastrointestinal: Positive for abdominal pain.  Genitourinary: Positive for difficulty urinating.  Musculoskeletal: Positive for gait problem.  Neurological: Positive for numbness.       Tingling  Psychiatric/Behavioral: Positive for dysphoric mood.  All other systems  reviewed and are negative.      Objective:   Physical Exam  Nursing note and vitals reviewed. Constitutional: He is oriented to person, place, and time. He appears well-developed and well-nourished.  HENT:  Head: Normocephalic and atraumatic.  Eyes: Conjunctivae and EOM are normal. Pupils are equal, round, and reactive to light.  Neck: Normal range of motion.  Neurological: He is alert and oriented to person, place, and time.  Psychiatric: He has a normal mood and affect.    Lacks 20 from full extension at the left knee. Left knee flexion 100 Lacks 10 from full extension right knee Right knee flexion 95 Shoulder range of motion is normal bilateral Sensation is intact to light touch in bilateral upper and lower extremities. Motor strength is 5/5 bilateral upper and lower extremities at the deltoid, bicep, tricep, grip, hip flexor, knee extensors, ankle dorsiflexor plantar flexor.       Assessment & Plan:  1.  End stage osteoarthritis of bilateral knees in a pt with morbid obesity, DM, HTN. Has already tried corticosteroid injections as well as Synvisc. No significant relief. Not a good surgical candidate due to comorbidities. Recommend trial of diclofenac gel Patient has had good relief with tramadol 50 mg 3 times a day. May restart this. He has difficulty in making monthly visits therefore scheduled to narcotics will be avoided unless he is unable to perform ADLs and mobility on the current regimen  RTC 6 months Patient instructed to call sooner if the current medication regimen is ineffective then I would consider Tylenol 3

## 2013-08-14 NOTE — Patient Instructions (Signed)
If you get good pain relief from the combination of tramadol with diclofenac gel we need to see you back in about 6 months. If this combination is not helpful and we need to use a stronger medicine and we'll need to see you monthly. Next appointment in 6 months but can do it sooner if you're not having good relief

## 2013-08-17 ENCOUNTER — Other Ambulatory Visit: Payer: 59

## 2013-08-17 DIAGNOSIS — E1129 Type 2 diabetes mellitus with other diabetic kidney complication: Secondary | ICD-10-CM

## 2013-08-17 DIAGNOSIS — IMO0002 Reserved for concepts with insufficient information to code with codable children: Secondary | ICD-10-CM

## 2013-08-17 DIAGNOSIS — E1165 Type 2 diabetes mellitus with hyperglycemia: Secondary | ICD-10-CM

## 2013-08-17 DIAGNOSIS — E538 Deficiency of other specified B group vitamins: Secondary | ICD-10-CM

## 2013-08-17 DIAGNOSIS — I10 Essential (primary) hypertension: Secondary | ICD-10-CM

## 2013-08-18 LAB — HEMOGLOBIN A1C
Est. average glucose Bld gHb Est-mCnc: 146 mg/dL
Hgb A1c MFr Bld: 6.7 % — ABNORMAL HIGH (ref 4.8–5.6)

## 2013-08-18 LAB — BASIC METABOLIC PANEL
BUN/Creatinine Ratio: 9 — ABNORMAL LOW (ref 10–22)
BUN: 11 mg/dL (ref 8–27)
CO2: 24 mmol/L (ref 18–29)
Calcium: 9 mg/dL (ref 8.6–10.2)
Chloride: 101 mmol/L (ref 97–108)
Creatinine, Ser: 1.18 mg/dL (ref 0.76–1.27)
GFR calc Af Amer: 75 mL/min/{1.73_m2} (ref >59)
GFR calc non Af Amer: 65 mL/min/{1.73_m2} (ref >59)
Glucose: 108 mg/dL — ABNORMAL HIGH (ref 65–99)
Potassium: 3.3 mmol/L — ABNORMAL LOW (ref 3.5–5.2)
Sodium: 146 mmol/L — ABNORMAL HIGH (ref 134–144)

## 2013-08-18 LAB — VITAMIN B12: Vitamin B-12: 533 pg/mL (ref 211–946)

## 2013-08-22 ENCOUNTER — Ambulatory Visit (INDEPENDENT_AMBULATORY_CARE_PROVIDER_SITE_OTHER): Payer: 59 | Admitting: Internal Medicine

## 2013-08-22 ENCOUNTER — Encounter: Payer: Self-pay | Admitting: Internal Medicine

## 2013-08-22 VITALS — BP 162/88 | HR 77 | Temp 98.0°F | Resp 18 | Ht 69.0 in | Wt 321.2 lb

## 2013-08-22 DIAGNOSIS — E876 Hypokalemia: Secondary | ICD-10-CM

## 2013-08-22 DIAGNOSIS — M1710 Unilateral primary osteoarthritis, unspecified knee: Secondary | ICD-10-CM | POA: Diagnosis not present

## 2013-08-22 DIAGNOSIS — I10 Essential (primary) hypertension: Secondary | ICD-10-CM

## 2013-08-22 DIAGNOSIS — E785 Hyperlipidemia, unspecified: Secondary | ICD-10-CM

## 2013-08-22 DIAGNOSIS — E1165 Type 2 diabetes mellitus with hyperglycemia: Secondary | ICD-10-CM | POA: Diagnosis not present

## 2013-08-22 DIAGNOSIS — E1129 Type 2 diabetes mellitus with other diabetic kidney complication: Secondary | ICD-10-CM

## 2013-08-22 DIAGNOSIS — M170 Bilateral primary osteoarthritis of knee: Secondary | ICD-10-CM

## 2013-08-22 DIAGNOSIS — IMO0002 Reserved for concepts with insufficient information to code with codable children: Secondary | ICD-10-CM

## 2013-08-22 MED ORDER — HYDROCHLOROTHIAZIDE 50 MG PO TABS: ORAL_TABLET | ORAL | Status: DC

## 2013-08-22 MED ORDER — POTASSIUM CHLORIDE CRYS ER 20 MEQ PO TBCR: 20.0000 meq | EXTENDED_RELEASE_TABLET | Freq: Every day | ORAL | Status: DC

## 2013-08-22 NOTE — Progress Notes (Signed)
Patient ID: Thomas [name], male   DOB: 10/14/1950, 63 y.o.   MRN: [id number]    Chief Complaint  Patient presents with  . Follow-up    dm, htn, HL   No Known Allergies  HPI 63 y/o male patient with history of HTN, DM, obesity and hyperlipidemia is here for follow up on his HTN and DM. bp elevated in office this am. Has has not taken his HCTZ since yesterday. He has not checked his blood sugar since yesterday. No home bp and cbg readings for review.  bp yesterday 159/89. bp at home mostly > 140 SBP cbg readings 120-160 mostly. Denies any reading above 200. He was trying to fast one day and had cbg drop to 22. He drank orange juice and this brought it up to 78.   Review of Systems   Constitutional: Negative for fever, chills and appetite change.   HENT: Negative for congestion and ear discharge. occassional tinnitus   Eyes: Negative for pain and visual disturbance. Wears corrective lenses and follows with dr Bethanne Ginger   Respiratory: Negative for cough, shortness of breath and wheezing.    Cardiovascular: Negative for chest pain, palpitations and leg swelling.   Gastrointestinal: no abdominal pain. no constipation Genitourinary:  Negative for flank pain and dysuria. Self catheterizes and follows with Dr Cecilie Lowers from urology, has hx of prostate cancer s/p seed treatment Musculoskeletal: Positive for arthritis Negative for joint swelling. Seen in pain clinic   Skin: Negative for rash and wound.   Neurological: Negative for dizziness, tremors, syncope, weakness, light-headedness and headaches.   Hematological: Negative for adenopathy.   Psychiatric/Behavioral: Negative for suicidal ideas, behavioral problems, sleep disturbance, self-injury and agitation.  Past Medical History  Diagnosis Date  . Diabetes mellitus   . Hypertension   . Arthritis   . Chronic kidney disease   . Cancer 2010    Prostate   Current Outpatient Prescriptions on File Prior to Visit  Medication Sig Dispense  Refill  . amLODipine (NORVASC) 10 MG tablet Take 1 tablet (10 mg total) by mouth daily.  90 tablet  3  . aspirin 81 MG tablet Take 81 mg by mouth daily.      Marland Kitchen atorvastatin (LIPITOR) 10 MG tablet Take 1 tablet (10 mg total) by mouth daily.  90 tablet  3  . carvedilol (COREG) 6.25 MG tablet Take 1 tablet (6.25 mg total) by mouth 2 (two) times daily with a meal.  60 tablet  3  . ferrous sulfate 325 (65 FE) MG tablet As directed      . glipiZIDE (GLUCOTROL) 10 MG tablet Take 1 tablet (10 mg total) by mouth daily before breakfast.  90 tablet  3  . glucose blood test strip Use to test blood sugar twice daily. Dx 250.00  100 each  12  . iron polysaccharides (NIFEREX) 150 MG capsule Take 1 capsule (150 mg total) by mouth 2 (two) times daily.  60 capsule  3  . linagliptin (TRADJENTA) 5 MG TABS tablet Take 1 tablet (5 mg total) by mouth daily.  90 tablet  3  . lisinopril (PRINIVIL,ZESTRIL) 40 MG tablet Take 1 tablet (40 mg total) by mouth daily. Take one tablet once daily for blood pressure  90 tablet  3  . SM SUPER THIN LANCETS 30G MISC Use to test blood sugar twice daily. Dx  250.00  100 each  12  . traMADol (ULTRAM) 50 MG tablet Take 1 tablet (50 mg total) by mouth every 8 (eight) hours  as needed.  90 tablet  5  . vitamin B-12 (CYANOCOBALAMIN) 1000 MCG tablet Take 1,000 mcg by mouth daily.      . diclofenac sodium (VOLTAREN) 1 % GEL Apply 2 g topically 4 (four) times daily.  5 Tube  5  . diptheria-tetanus toxoids (DECAVAC) 2-2 LF/0.5ML injection Inject 0.5 mLs into the muscle once.  0.5 mL  0  . HYDROcodone-acetaminophen (NORCO/VICODIN) 5-325 MG per tablet Take 1 tablet by mouth 2 (two) times daily.      . metFORMIN (GLUCOPHAGE) 500 MG tablet Take 1 tablet (500 mg total) by mouth 2 (two) times daily with a meal.  60 tablet  3   No current facility-administered medications on file prior to visit.   Physical exam BP 162/88  Pulse 77  Temp(Src) 98 F (36.7 C) (Oral)  Resp 18  Ht 5\' 9"  (1.753 m)   Wt 321 lb 3.2 oz (145.695 kg)  BMI 47.41 kg/m2  SpO2 96%  Constitutional: He is oriented to person, place, and time. No distress. obese   HENT:   Head: Normocephalic and atraumatic.   Mouth/Throat: Oropharynx is clear and moist.   Eyes: Pupils are equal, round, and reactive to light.   Neck: Normal range of motion. Neck supple. No JVD present. No tracheal deviation present. No thyromegaly present.   Cardiovascular: Normal rate and regular rhythm.    Pulmonary/Chest: Effort normal and breath sounds normal. Abdominal: Soft. Bowel sounds are normal. There is no tenderness.  Musculoskeletal: Normal range of motion. He exhibits no edema.  Crepitus present in both knee joint, ROM in LE limited with body habitus in left > right, strength adequate  Lymphadenopathy:    He has no cervical adenopathy.  Neurological: He is alert and oriented to person, place, and time. No cranial nerve deficit.   Skin: Skin is warm and dry. He is not diaphoretic. No skin lesion noted Psychiatric: He has a normal mood and affect.    Labs- Lab Results  Component Value Date   HGBA1C 6.7* 08/17/2013   Lipid Panel     Component Value Date/Time   TRIG 109 04/25/2013 0850   HDL 49 04/25/2013 0850   CHOLHDL 4.4 04/25/2013 0850   LDLCALC 144* 04/25/2013 0850   Assessment/plan  1. Hypokalemia Will start him on kcl 20 meq daily - likely in setting of use of hctz. Recheck bmp in 1 week  - Basic Metabolic Panel; Future  2. Essential hypertension With bp not at goal, increase hctz to 50 mg daily and continue amlodipine, liisnopril and coreg. Explained about warning signs with elevated bp, med compliance and dietary restrictions with weight loss  3. DM type 2, uncontrolled, with renal complications Improved U2P s/o better controlled glucose. Also has lost weight, congratulated on this. Will not change his glycemic regimen for now with goal a1c 6.5 for him. Continue metformin, glipizide and tradjenta for now. If a1c  remains < 7, will decrease his glipizide - Microalbumin/Creatinine Ratio, Urine; Future - Hemoglobin A1c; Future - Basic Metabolic Panel; Future  4. Primary osteoarthritis of both knees [facility]. Lost some weight. Continue prn tramadol for pain for now  5. Hyperlipidemia with target LDL less than 100 Improved lipid panel. Continue lipitor 10 mg daily - Lipid Panel; Future  6. Morbid obesity Dietary counselling, exercise regimen revisited. Med compliance reinforced

## 2013-08-29 ENCOUNTER — Other Ambulatory Visit: Payer: 59

## 2013-08-29 DIAGNOSIS — E1129 Type 2 diabetes mellitus with other diabetic kidney complication: Secondary | ICD-10-CM

## 2013-08-29 DIAGNOSIS — E1165 Type 2 diabetes mellitus with hyperglycemia: Secondary | ICD-10-CM

## 2013-08-29 DIAGNOSIS — E876 Hypokalemia: Secondary | ICD-10-CM

## 2013-08-29 DIAGNOSIS — IMO0002 Reserved for concepts with insufficient information to code with codable children: Secondary | ICD-10-CM

## 2013-08-31 LAB — BASIC METABOLIC PANEL
BUN/Creatinine Ratio: 16 (ref 10–22)
BUN: 18 mg/dL (ref 8–27)
CO2: 24 mmol/L (ref 18–29)
Calcium: 9 mg/dL (ref 8.6–10.2)
Chloride: 95 mmol/L — ABNORMAL LOW (ref 97–108)
Creatinine, Ser: 1.14 mg/dL (ref 0.76–1.27)
GFR calc Af Amer: 79 mL/min/{1.73_m2} (ref >59)
GFR calc non Af Amer: 68 mL/min/{1.73_m2} (ref >59)
Glucose: 181 mg/dL — ABNORMAL HIGH (ref 65–99)
Potassium: 3.3 mmol/L — ABNORMAL LOW (ref 3.5–5.2)
Sodium: 139 mmol/L (ref 134–144)

## 2013-08-31 LAB — MICROALBUMIN / CREATININE URINE RATIO
Creatinine, Ur: 182.6 mg/dL (ref 22.0–328.0)
MICROALB/CREAT RATIO: 15 mg/g creat (ref 0.0–30.0)
Microalbumin, Urine: 27.4 ug/mL — ABNORMAL HIGH (ref 0.0–17.0)

## 2013-09-01 ENCOUNTER — Other Ambulatory Visit: Payer: Self-pay | Admitting: *Deleted

## 2013-09-01 MED ORDER — POTASSIUM CHLORIDE CRYS ER 20 MEQ PO TBCR: 20.0000 meq | EXTENDED_RELEASE_TABLET | Freq: Two times a day (BID) | ORAL | Status: DC

## 2013-09-01 NOTE — Telephone Encounter (Signed)
Pt notified VIA phone regarding results/recommendations.  He wants the 40 meq sent to the pharmacy couldn't find 40 in system so sent the 20 for BID.  Also notified to continue Lisinopril.

## 2013-10-05 ENCOUNTER — Other Ambulatory Visit: Payer: Self-pay | Admitting: Internal Medicine

## 2013-10-29 ENCOUNTER — Encounter (HOSPITAL_COMMUNITY): Payer: Self-pay | Admitting: Emergency Medicine

## 2013-10-29 ENCOUNTER — Emergency Department (HOSPITAL_COMMUNITY): Payer: 59

## 2013-10-29 ENCOUNTER — Emergency Department (HOSPITAL_COMMUNITY)
Admission: EM | Admit: 2013-10-29 | Discharge: 2013-10-29 | Disposition: A | Discharge status: Home / Self Care | Payer: 59 | Attending: Emergency Medicine | Admitting: Emergency Medicine

## 2013-10-29 DIAGNOSIS — Y9389 Activity, other specified: Secondary | ICD-10-CM | POA: Diagnosis not present

## 2013-10-29 DIAGNOSIS — N189 Chronic kidney disease, unspecified: Secondary | ICD-10-CM | POA: Diagnosis not present

## 2013-10-29 DIAGNOSIS — M129 Arthropathy, unspecified: Secondary | ICD-10-CM | POA: Diagnosis not present

## 2013-10-29 DIAGNOSIS — S199XXA Unspecified injury of neck, initial encounter: Secondary | ICD-10-CM | POA: Diagnosis not present

## 2013-10-29 DIAGNOSIS — Z7982 Long term (current) use of aspirin: Secondary | ICD-10-CM | POA: Insufficient documentation

## 2013-10-29 DIAGNOSIS — I129 Hypertensive chronic kidney disease with stage 1 through stage 4 chronic kidney disease, or unspecified chronic kidney disease: Secondary | ICD-10-CM | POA: Insufficient documentation

## 2013-10-29 DIAGNOSIS — R930 Abnormal findings on diagnostic imaging of skull and head, not elsewhere classified: Secondary | ICD-10-CM | POA: Diagnosis not present

## 2013-10-29 DIAGNOSIS — Z8546 Personal history of malignant neoplasm of prostate: Secondary | ICD-10-CM | POA: Insufficient documentation

## 2013-10-29 DIAGNOSIS — Z79899 Other long term (current) drug therapy: Secondary | ICD-10-CM | POA: Insufficient documentation

## 2013-10-29 DIAGNOSIS — Y9289 Other specified places as the place of occurrence of the external cause: Secondary | ICD-10-CM | POA: Insufficient documentation

## 2013-10-29 DIAGNOSIS — S0993XA Unspecified injury of face, initial encounter: Secondary | ICD-10-CM | POA: Diagnosis not present

## 2013-10-29 DIAGNOSIS — E119 Type 2 diabetes mellitus without complications: Secondary | ICD-10-CM | POA: Insufficient documentation

## 2013-10-29 DIAGNOSIS — S0990XA Unspecified injury of head, initial encounter: Secondary | ICD-10-CM

## 2013-10-29 DIAGNOSIS — W1809XA Striking against other object with subsequent fall, initial encounter: Secondary | ICD-10-CM | POA: Insufficient documentation

## 2013-10-29 NOTE — ED Provider Notes (Signed)
CSN: [id number]     Arrival date & time 10/29/13  6063 History   First MD Initiated Contact with Patient 10/29/13 [phone]     Chief Complaint  Patient presents with  . Head Injury     (Consider location/radiation/quality/duration/timing/severity/associated sxs/prior Treatment) Patient is a 63 y.o. male presenting with head injury. The history is provided by the patient.  Head Injury  He injured his left forehead 5 days ago, when he slipped and struck his head, on the side of a pool. He did not lose consciousness. He is here because he has a continued sensation of pressure in the left forehead. He denies nausea, vomiting, weakness, paresthesias, gait problem or difficulty eating. No prior head injuries. He is not taking blood thinners. There are no other known modifying factors.  Past Medical History  Diagnosis Date  . Diabetes mellitus   . Hypertension   . Arthritis   . Chronic kidney disease   . Cancer 2010    Prostate   Past Surgical History  Procedure Laterality Date  . Knee surgery    . Shoulder surgery    . Hernia repair    . Prostate surgery    . Uretha surgery-2014    . Radioactive seed implant     No family history on file. History  Substance Use Topics  . Smoking status: Never Smoker   . Smokeless tobacco: Never Used  . Alcohol Use: No     Comment: never    Review of Systems  All other systems reviewed and are negative.     Allergies  Review of patient's allergies indicates no known allergies.  Home Medications   Prior to Admission medications   Medication Sig Start Date End Date Taking? Authorizing Provider  amLODipine (NORVASC) 10 MG tablet Take 1 tablet (10 mg total) by mouth daily. 09/27/12  Yes Mahima Bubba Camp, MD  aspirin 81 MG tablet Take 81 mg by mouth daily.   Yes Historical Provider, MD  atorvastatin (LIPITOR) 10 MG tablet Take 1 tablet (10 mg total) by mouth daily. 05/02/13  Yes Mahima Bubba Camp, MD  carvedilol (COREG) 6.25 MG tablet Take 1 tablet  (6.25 mg total) by mouth 2 (two) times daily with a meal. 05/02/13  Yes Mahima Pandey, MD  glipiZIDE (GLUCOTROL) 10 MG tablet Take 1 tablet (10 mg total) by mouth daily before breakfast. 05/02/13  Yes Mahima Pandey, MD  hydrochlorothiazide (HYDRODIURIL) 50 MG tablet Take 50 mg by mouth daily.   Yes Historical Provider, MD  iron polysaccharides (NIFEREX) 150 MG capsule Take 1 capsule (150 mg total) by mouth 2 (two) times daily. 09/27/12  Yes Mahima Pandey, MD  linagliptin (TRADJENTA) 5 MG TABS tablet Take 1 tablet (5 mg total) by mouth daily. 09/27/12  Yes Mahima Pandey, MD  lisinopril (PRINIVIL,ZESTRIL) 40 MG tablet Take 40 mg by mouth daily.   Yes Historical Provider, MD  metFORMIN (GLUCOPHAGE) 500 MG tablet Take 500 mg by mouth 2 (two) times daily with a meal.   Yes Historical Provider, MD  potassium chloride SA (K-DUR,KLOR-CON) 20 MEQ tablet Take 1 tablet (20 mEq total) by mouth 2 (two) times daily. 09/01/13  Yes Mahima Bubba Camp, MD  traMADol (ULTRAM) 50 MG tablet Take 50 mg by mouth every 8 (eight) hours as needed (pain).   Yes Historical Provider, MD  vitamin B-12 (CYANOCOBALAMIN) 1000 MCG tablet Take 1,000 mcg by mouth daily.   Yes Historical Provider, MD  glucose blood test strip Use to test blood sugar twice daily. Dx 250.00  07/04/13   Blanchie Serve, MD  SM SUPER THIN LANCETS 30G MISC Use to test blood sugar twice daily. Dx  250.00 07/04/13   Mahima Pandey, MD   BP 139/65  Pulse 65  Temp(Src) 97.9 F (36.6 C) (Oral)  Resp 18  SpO2 94% Physical Exam  Nursing note and vitals reviewed. Constitutional: He is oriented to person, place, and time. He appears well-developed and well-nourished.  HENT:  Head: Normocephalic and atraumatic.  Right Ear: External ear normal.  Left Ear: External ear normal.  No crepitation or deformity of the scalp or forehead  Eyes: Conjunctivae and EOM are normal. Pupils are equal, round, and reactive to light.  Neck: Normal range of motion and phonation normal. Neck  supple.  Cardiovascular: Normal rate, regular rhythm, normal heart sounds and intact distal pulses.   Pulmonary/Chest: Effort normal and breath sounds normal. He exhibits no bony tenderness.  Abdominal: Soft. There is no tenderness.  Musculoskeletal: Normal range of motion.  Neurological: He is alert and oriented to person, place, and time. No cranial nerve deficit or sensory deficit. He exhibits normal muscle tone. Coordination normal.  Skin: Skin is warm, dry and intact.  Psychiatric: He has a normal mood and affect. His behavior is normal. Judgment and thought content normal.    ED Course  Procedures (including critical care time)  Medications - No data to display  Patient Vitals for the past 24 hrs:  BP Temp Temp src Pulse Resp SpO2  10/29/13 1139 - - - - - 94 %  10/29/13 1138 139/65 mmHg 97.9 F (36.6 C) Oral 65 18 -  10/29/13 0950 148/72 mmHg 98.3 F (36.8 C) Oral 70 20 98 %    At D/C- Reevaluation with update and discussion. After initial assessment and treatment, an updated evaluation reveals he is comfortable. Findings discussed with pt., and family, all questions answered. [place] Review Labs Reviewed - No data to display  Imaging Review Ct Head Wo Contrast  10/29/2013   CLINICAL DATA:  Headache after injury. Slipped and fell hitting left forehead. Dizziness.  EXAM: CT HEAD WITHOUT CONTRAST  TECHNIQUE: Contiguous axial images were obtained from the base of the skull through the vertex without intravenous contrast.  COMPARISON:  None.  FINDINGS: Negative for scalp hematoma. No discrete soft tissue swelling of the scalp is appreciated by CT.  These skull is intact.  There is a focal sclerotic lesion in the left mandibular condyle measuring up to 8 mm. No prior head CTs are available for comparison to assess chronicity of this finding.  No acute intracranial abnormality is identified. Specifically, no intra or extra-axial hemorrhage, hydrocephalus, mass  lesion, mass effect, or evidence of acute cortically based infarction.  IMPRESSION: No acute intracranial abnormality. No evidence of scalp hematoma or skull fracture.  8 mm sclerotic lesion in the left mandibular condyle, with no prior CT studies of this region for comparison over time. Reportedly, the patient has a history of prostate cancer. If clinically indicated, a follow-up whole-body nuclear medicine bone scan could be considered.   Electronically Signed   By: Curlene Dolphin M.D.   On: 10/29/2013 10:49     EKG Interpretation None      MDM   Final diagnoses:  Head injury, initial encounter  Abnormal CT scan, head   Head injury without serious injury. Possible mild concussion.  Incidental left mandible lesion with sone concern for prostate Ca.   Nursing Notes Reviewed/ Care Coordinated Applicable Imaging  Reviewed Interpretation of Laboratory Data incorporated into ED treatment  The patient appears reasonably screened and/or stabilized for discharge and I doubt any other medical condition or other [facility] requiring further screening, evaluation, or treatment in the ED at this time prior to discharge.  Plan: Home Medications- usual; Home Treatments- rest; return here if the recommended treatment, does not improve the symptoms; Recommended follow up- PCP prn. Urology f/u regarding possible evaluation for metastatic prostate cancer    Richarda Blade, MD 10/29/13 1801

## 2013-10-29 NOTE — ED Notes (Signed)
Pt. Stated, [place] [employer] [place] , slipped and hit my and I did not Loss consciousness but I keep feeling pressure.

## 2013-10-29 NOTE — Discharge Instructions (Signed)

## 2013-11-01 DIAGNOSIS — R339 Retention of urine, unspecified: Secondary | ICD-10-CM | POA: Insufficient documentation

## 2013-11-17 ENCOUNTER — Other Ambulatory Visit: Payer: 59

## 2013-11-17 DIAGNOSIS — E1165 Type 2 diabetes mellitus with hyperglycemia: Secondary | ICD-10-CM | POA: Diagnosis not present

## 2013-11-17 DIAGNOSIS — IMO0002 Reserved for concepts with insufficient information to code with codable children: Secondary | ICD-10-CM

## 2013-11-17 DIAGNOSIS — E785 Hyperlipidemia, unspecified: Secondary | ICD-10-CM

## 2013-11-17 DIAGNOSIS — E1129 Type 2 diabetes mellitus with other diabetic kidney complication: Secondary | ICD-10-CM | POA: Diagnosis not present

## 2013-11-18 LAB — LIPID PANEL
Chol/HDL Ratio: 3.4 ratio units (ref 0.0–5.0)
Cholesterol, Total: 148 mg/dL (ref 100–199)
HDL: 44 mg/dL (ref >39)
LDL Calculated: 84 mg/dL (ref 0–99)
Triglycerides: 101 mg/dL (ref 0–149)
VLDL Cholesterol Cal: 20 mg/dL (ref 5–40)

## 2013-11-18 LAB — BASIC METABOLIC PANEL
BUN/Creatinine Ratio: 16 (ref 10–22)
BUN: 21 mg/dL (ref 8–27)
CO2: 25 mmol/L (ref 18–29)
Calcium: 9.1 mg/dL (ref 8.6–10.2)
Chloride: 100 mmol/L (ref 97–108)
Creatinine, Ser: 1.28 mg/dL — ABNORMAL HIGH (ref 0.76–1.27)
GFR calc Af Amer: 68 mL/min/{1.73_m2} (ref >59)
GFR calc non Af Amer: 59 mL/min/{1.73_m2} — ABNORMAL LOW (ref >59)
Glucose: 174 mg/dL — ABNORMAL HIGH (ref 65–99)
Potassium: 3.4 mmol/L — ABNORMAL LOW (ref 3.5–5.2)
Sodium: 143 mmol/L (ref 134–144)

## 2013-11-18 LAB — HEMOGLOBIN A1C
Est. average glucose Bld gHb Est-mCnc: 134 mg/dL
Hgb A1c MFr Bld: 6.3 % — ABNORMAL HIGH (ref 4.8–5.6)

## 2013-11-20 ENCOUNTER — Other Ambulatory Visit: Payer: Self-pay | Admitting: Internal Medicine

## 2013-11-22 ENCOUNTER — Encounter: Payer: Self-pay | Admitting: Internal Medicine

## 2013-11-22 ENCOUNTER — Ambulatory Visit (INDEPENDENT_AMBULATORY_CARE_PROVIDER_SITE_OTHER): Payer: 59 | Admitting: Internal Medicine

## 2013-11-22 VITALS — BP 158/86 | HR 72 | Temp 98.2°F | Ht 70.5 in | Wt 321.0 lb

## 2013-11-22 DIAGNOSIS — IMO0002 Reserved for concepts with insufficient information to code with codable children: Secondary | ICD-10-CM

## 2013-11-22 DIAGNOSIS — I10 Essential (primary) hypertension: Secondary | ICD-10-CM

## 2013-11-22 DIAGNOSIS — E1129 Type 2 diabetes mellitus with other diabetic kidney complication: Secondary | ICD-10-CM

## 2013-11-22 DIAGNOSIS — E1165 Type 2 diabetes mellitus with hyperglycemia: Secondary | ICD-10-CM

## 2013-11-22 DIAGNOSIS — M179 Osteoarthritis of knee, unspecified: Secondary | ICD-10-CM

## 2013-11-22 DIAGNOSIS — D509 Iron deficiency anemia, unspecified: Secondary | ICD-10-CM | POA: Insufficient documentation

## 2013-11-22 DIAGNOSIS — E876 Hypokalemia: Secondary | ICD-10-CM

## 2013-11-22 DIAGNOSIS — Z23 Encounter for immunization: Secondary | ICD-10-CM

## 2013-11-22 DIAGNOSIS — R1314 Dysphagia, pharyngoesophageal phase: Secondary | ICD-10-CM

## 2013-11-22 DIAGNOSIS — E785 Hyperlipidemia, unspecified: Secondary | ICD-10-CM

## 2013-11-22 DIAGNOSIS — M1710 Unilateral primary osteoarthritis, unspecified knee: Secondary | ICD-10-CM

## 2013-11-22 MED ORDER — OMEPRAZOLE 20 MG PO CPDR: 20.0000 mg | DELAYED_RELEASE_CAPSULE | Freq: Every day | ORAL | Status: DC

## 2013-11-22 MED ORDER — GLIPIZIDE 5 MG PO TABS: 5.0000 mg | ORAL_TABLET | Freq: Every day | ORAL | Status: DC

## 2013-11-22 MED ORDER — LISINOPRIL 40 MG PO TABS: 40.0000 mg | ORAL_TABLET | Freq: Every day | ORAL | Status: DC

## 2013-11-22 NOTE — Progress Notes (Signed)
Patient ID: Thomas [name], male   DOB: Jul 10, 1950, 63 y.o.   MRN: [id number]    Chief Complaint  Patient presents with  . Medical Management of Chronic Issues    3 month follow-up, discuss labs (copy printed)   . Referral    Colonoscopy, performed on [place]    No Known Allergies  HPI 63 y/o male pt here for RV.  Reviewed cbg readings. 30 day average is 145 and 2 weeks is 156  bp this am 133/87. In the office it is elevated bp average from home 112-160/ 70-93  Complaint with his meds except tradjenta which he ran out of for 1 week  Has noticed difficulty swallowing solid food. Denies cough with meals. No uri symptoms.   Pain under control with tramadol  Reviewed labs.   Patient was in the ED recently after head injury in pool, no acute abnormalities noted. A sclerotic lesion was noted in his mandibular bone and has followed with urology with concerns of mets from his prostate cancer.  Wt Readings from Last 3 Encounters:  11/22/13 321 lb (145.605 kg)  08/22/13 321 lb 3.2 oz (145.695 kg)  08/14/13 340 lb (154.223 kg)   Review of Systems  Constitutional: Negative for fever, chills, malaise/fatigue and diaphoresis.  HENT: Negative for congestion, hearing loss and sore throat.   Eyes: Negative for blurred vision, double vision and discharge.  Respiratory: Negative for cough, sputum production, shortness of breath and wheezing.   Cardiovascular: Negative for chest pain, palpitations, orthopnea and leg swelling.  Gastrointestinal: Negative for heartburn, nausea, vomiting, abdominal pain, constipation.  Genitourinary: Negative for dysuria Musculoskeletal: Negative for falls, myalgias.  Skin: Negative for itching and rash.  Neurological: Negative for dizziness, tingling, focal weakness and headaches.  Psychiatric/Behavioral: Negative for depression and memory loss. The patient is not nervous/anxious.  Past Medical History  Diagnosis Date  . Diabetes mellitus    . Hypertension   . Arthritis   . Chronic kidney disease   . Cancer 2010    Prostate      Past Surgical History  Procedure Laterality Date  . Knee surgery    . Shoulder surgery    . Hernia repair    . Prostate surgery    . Uretha surgery-2014    . Radioactive seed implant     Current Outpatient Prescriptions on File Prior to Visit  Medication Sig Dispense Refill  . amLODipine (NORVASC) 10 MG tablet Take 1 tablet (10 mg total) by mouth daily.  90 tablet  3  . aspirin 81 MG tablet Take 81 mg by mouth daily.      Marland Kitchen atorvastatin (LIPITOR) 10 MG tablet Take 1 tablet (10 mg total) by mouth daily.  90 tablet  3  . carvedilol (COREG) 6.25 MG tablet Take 1 tablet (6.25 mg total) by mouth 2 (two) times daily with a meal.  60 tablet  3  . glucose blood test strip Use to test blood sugar twice daily. Dx 250.00  100 each  12  . hydrochlorothiazide (HYDRODIURIL) 50 MG tablet Take 50 mg by mouth daily.      . iron polysaccharides (NIFEREX) 150 MG capsule Take 1 capsule (150 mg total) by mouth 2 (two) times daily.  60 capsule  3  . metFORMIN (GLUCOPHAGE) 500 MG tablet Take 500 mg by mouth 2 (two) times daily with a meal.      . potassium chloride SA (K-DUR,KLOR-CON) 20 MEQ tablet Take 1 tablet (20 mEq total) by  mouth 2 (two) times daily.  60 tablet  3  . SM SUPER THIN LANCETS 30G MISC Use to test blood sugar twice daily. Dx  250.00  100 each  12  . TRADJENTA 5 MG TABS tablet TAKE 1 TABLET BY MOUTH DAILY  90 tablet  3  . traMADol (ULTRAM) 50 MG tablet Take 50 mg by mouth every 8 (eight) hours as needed (pain).      . vitamin B-12 (CYANOCOBALAMIN) 1000 MCG tablet Take 1,000 mcg by mouth daily.       No current facility-administered medications on file prior to visit.   Physical exam BP 158/86  Pulse 72  Temp(Src) 98.2 F (36.8 C) (Oral)  Ht 5' 10.5" (1.791 m)  Wt 321 lb (145.605 kg)  BMI 45.39 kg/m2  SpO2 96%  Constitutional: He is oriented to person, place, and time. He appears  well-developed and well-nourished.  HENT:   Head: Normocephalic and atraumatic.  Right Ear: External ear normal.  Left Ear: External ear normal.  Eyes: Conjunctivae and EOM are normal. Pupils are equal, round, and reactive to light.  Neck: Normal range of motion and phonation normal. Neck supple.  Oral: no thrush, normal oropharynx Cardiovascular: Normal rate, regular rhythm, normal heart sounds and intact distal pulses.   Pulmonary/Chest: Effort normal and breath sounds normal. He exhibits no bony tenderness.  Abdominal: Soft. There is no tenderness.  Musculoskeletal: Normal range of motion.  Neurological: He is alert and oriented to person, place, and time. No cranial nerve deficit or sensory deficit. He exhibits normal muscle tone. Coordination normal.  Skin: Skin is warm, dry and intact.  Psychiatric: He has a normal mood and affect. His behavior is normal. Judgment and thought content normal.    Labs  Lab Results  Component Value Date   WBC 9.4 08/24/2012   HGB 13.3 08/24/2012   HCT 39.8 08/24/2012   MCV 82 08/24/2012   PLT 290 12/06/2009   CMP     Component Value Date/Time   NA 143 11/17/2013 0826   NA 139 11/16/2011 1427   K 3.4* 11/17/2013 0826   CL 100 11/17/2013 0826   CO2 25 11/17/2013 0826   GLUCOSE 174* 11/17/2013 0826   GLUCOSE 328* 11/16/2011 1427   BUN 21 11/17/2013 0826   BUN 10 11/16/2011 1427   CREATININE 1.28* 11/17/2013 0826   CALCIUM 9.1 11/17/2013 0826   PROT 7.2 04/25/2013 0850   PROT 7.2 12/06/2009 1045   ALBUMIN 3.3* 12/06/2009 1045   AST 12 04/25/2013 0850   ALT 10 04/25/2013 0850   ALKPHOS 79 04/25/2013 0850   BILITOT 0.5 04/25/2013 0850   GFRNONAA 59* 11/17/2013 0826   GFRAA 68 11/17/2013 0826   Lipid Panel     Component Value Date/Time   TRIG 101 11/17/2013 0826   HDL 44 11/17/2013 0826   CHOLHDL 3.4 11/17/2013 0826   LDLCALC 84 11/17/2013 0826    Lab Results  Component Value Date   HGBA1C 6.3* 11/17/2013   Assessment/plan  1. Essential  hypertension Some elevated readings but patient mentions emotional stress in family and says he would like to continue current regimen without any change. Continue amlodipine, lisinopril, hctz and coreg - CMP; Future  2. DM type 2, uncontrolled, with renal complications F5D s/o improved sugar reading. Decrease glipizide to 5 mg daily. Continue metformin 500 mg bid and tradjenta 5 mg daily. Has microalbuminuria. uptodatewith foot exam. Advised to make eye appointment. Continue statin, asa and ACEI - CMP; Future -  CBC with Differential; Future - TSH; Future - Hemoglobin A1c; Future  3. Osteoarthrosis, unspecified whether generalized or localized, involving lower leg Continue tramadol as prescribed. Continue water aerobics  4. Hyperlipidemia with target LDL less than 100 Reviewed lipid panel. Continue lipitor   5. Hypokalemia Slightly low k level in recent lab. Continue kcl supplement 20 meq bid for now  6. Morbid obesity Encouraged weight loss with his metabolic syndrome  7. Dysphagia, pharyngoesophageal phase Get barium swallow study to assess further. Will have him empirically on omeprazole 20 mg daily to see if this will help with his indigestion and constant sensation in his throat - DG Esophagus; Future  8. Anemia, iron deficiency Continue niferex, monitor h&h, stable currently

## 2013-12-05 DIAGNOSIS — M1712 Unilateral primary osteoarthritis, left knee: Secondary | ICD-10-CM | POA: Diagnosis not present

## 2013-12-05 DIAGNOSIS — E668 Other obesity: Secondary | ICD-10-CM | POA: Diagnosis not present

## 2013-12-07 DIAGNOSIS — D480 Neoplasm of uncertain behavior of bone and articular cartilage: Secondary | ICD-10-CM | POA: Diagnosis not present

## 2013-12-13 ENCOUNTER — Ambulatory Visit
Admission: RE | Admit: 2013-12-13 | Discharge: 2013-12-13 | Disposition: A | Discharge status: Home / Self Care | Payer: 59 | Source: Ambulatory Visit | Attending: Internal Medicine | Admitting: Internal Medicine

## 2013-12-13 DIAGNOSIS — K449 Diaphragmatic hernia without obstruction or gangrene: Secondary | ICD-10-CM | POA: Diagnosis not present

## 2013-12-13 DIAGNOSIS — R1314 Dysphagia, pharyngoesophageal phase: Secondary | ICD-10-CM

## 2013-12-13 DIAGNOSIS — R1310 Dysphagia, unspecified: Secondary | ICD-10-CM | POA: Diagnosis not present

## 2013-12-15 ENCOUNTER — Telehealth: Payer: Self-pay | Admitting: *Deleted

## 2013-12-15 NOTE — Telephone Encounter (Signed)
Spoke with patient, he will come in the office on 12/19/13 @ 9:15a to speak with Dr. Bubba Camp.

## 2013-12-15 NOTE — Telephone Encounter (Signed)
Message copied by Eilene Ghazi on Fri Dec 15, 2013  4:47 PM ------      Message from: Blanchie Serve      Created: Fri Dec 15, 2013  3:25 PM       Have appointment scheduled to see pt in clinic to discuss swallow study result in 1-2 weeks ------

## 2013-12-19 ENCOUNTER — Other Ambulatory Visit: Payer: Self-pay | Admitting: Internal Medicine

## 2013-12-19 ENCOUNTER — Ambulatory Visit: Payer: Self-pay | Admitting: Internal Medicine

## 2013-12-21 ENCOUNTER — Other Ambulatory Visit (HOSPITAL_COMMUNITY): Payer: Self-pay | Admitting: Oral and Maxillofacial Surgery

## 2013-12-21 DIAGNOSIS — R6884 Jaw pain: Secondary | ICD-10-CM

## 2013-12-27 ENCOUNTER — Encounter (HOSPITAL_COMMUNITY)
Admission: RE | Admit: 2013-12-27 | Discharge: 2013-12-27 | Disposition: A | Discharge status: Home / Self Care | Payer: 59 | Source: Ambulatory Visit | Attending: Diagnostic Radiology | Admitting: Diagnostic Radiology

## 2013-12-27 ENCOUNTER — Encounter (HOSPITAL_COMMUNITY)
Admission: RE | Admit: 2013-12-27 | Discharge: 2013-12-27 | Disposition: A | Discharge status: Home / Self Care | Payer: 59 | Source: Ambulatory Visit | Attending: Oral and Maxillofacial Surgery | Admitting: Oral and Maxillofacial Surgery

## 2013-12-27 ENCOUNTER — Other Ambulatory Visit: Payer: Self-pay | Admitting: Internal Medicine

## 2013-12-27 DIAGNOSIS — R6884 Jaw pain: Secondary | ICD-10-CM | POA: Insufficient documentation

## 2013-12-27 DIAGNOSIS — Z8546 Personal history of malignant neoplasm of prostate: Secondary | ICD-10-CM | POA: Insufficient documentation

## 2013-12-27 MED ORDER — TECHNETIUM TC 99M MEDRONATE IV KIT
25.0000 | PACK | Freq: Once | INTRAVENOUS | Status: AC | PRN
Start: 2013-12-27 — End: 2013-12-27
  Administered 2013-12-27: 25 via INTRAVENOUS

## 2014-02-05 ENCOUNTER — Ambulatory Visit: Payer: 59 | Admitting: Physical Medicine & Rehabilitation

## 2014-02-07 ENCOUNTER — Other Ambulatory Visit: Payer: Self-pay | Admitting: *Deleted

## 2014-02-07 MED ORDER — POLYSACCHARIDE IRON COMPLEX 150 MG PO CAPS
150.0000 mg | ORAL_CAPSULE | Freq: Two times a day (BID) | ORAL | Status: DC
Start: 2014-02-07 — End: 2015-03-28

## 2014-02-07 NOTE — Telephone Encounter (Signed)
[facility].  

## 2014-02-13 ENCOUNTER — Ambulatory Visit: Payer: 59 | Admitting: Physical Medicine & Rehabilitation

## 2014-02-14 ENCOUNTER — Ambulatory Visit (INDEPENDENT_AMBULATORY_CARE_PROVIDER_SITE_OTHER): Payer: 59 | Admitting: Internal Medicine

## 2014-02-14 ENCOUNTER — Encounter: Payer: Self-pay | Admitting: Internal Medicine

## 2014-02-14 VITALS — BP 182/100 | HR 69 | Temp 98.4°F | Resp 24 | Wt 325.8 lb

## 2014-02-14 DIAGNOSIS — E1165 Type 2 diabetes mellitus with hyperglycemia: Secondary | ICD-10-CM | POA: Diagnosis not present

## 2014-02-14 DIAGNOSIS — IMO0002 Reserved for concepts with insufficient information to code with codable children: Secondary | ICD-10-CM

## 2014-02-14 DIAGNOSIS — M62830 Muscle spasm of back: Secondary | ICD-10-CM | POA: Diagnosis not present

## 2014-02-14 DIAGNOSIS — E1129 Type 2 diabetes mellitus with other diabetic kidney complication: Secondary | ICD-10-CM | POA: Diagnosis not present

## 2014-02-14 DIAGNOSIS — I10 Essential (primary) hypertension: Secondary | ICD-10-CM | POA: Diagnosis not present

## 2014-02-14 DIAGNOSIS — T83511A Infection and inflammatory reaction due to indwelling urethral catheter, initial encounter: Principal | ICD-10-CM

## 2014-02-14 DIAGNOSIS — N390 Urinary tract infection, site not specified: Secondary | ICD-10-CM | POA: Diagnosis not present

## 2014-02-14 DIAGNOSIS — T8351XA Infection and inflammatory reaction due to indwelling urinary catheter, initial encounter: Secondary | ICD-10-CM

## 2014-02-14 LAB — POCT URINALYSIS DIPSTICK
Bilirubin, UA: NEGATIVE
Glucose, UA: NEGATIVE
Ketones, UA: NEGATIVE
Nitrite, UA: NEGATIVE
Protein, UA: NEGATIVE
Spec Grav, UA: 1.01
Urobilinogen, UA: NEGATIVE
pH, UA: 5

## 2014-02-14 MED ORDER — TIZANIDINE HCL 4 MG PO CAPS: 4.0000 mg | ORAL_CAPSULE | Freq: Three times a day (TID) | ORAL | Status: DC | PRN

## 2014-02-14 MED ORDER — CIPROFLOXACIN HCL 250 MG PO TABS: 250.0000 mg | ORAL_TABLET | Freq: Two times a day (BID) | ORAL | Status: AC

## 2014-02-14 NOTE — Patient Instructions (Signed)
Push fluids   Call if symptoms worsen or do not improve  May apply warm compresses as needed to lower back.  Will consider CT renal stone study if pain persists.  No heavy lifting, pushing or pulling or bending x 3 days. No swimming x 3 days

## 2014-02-14 NOTE — Progress Notes (Signed)
Patient ID: Thomas [name], male   DOB: December 07, 1950, 63 y.o.   MRN: [id number]    Facility  [facility]    Place of Service:   OFFICE   No Known Allergies  Chief Complaint  Patient presents with  . Acute Visit    lower back pain and dysuria x 3-4 days    HPI:   63 yo male presents today for acute visit. He reports LBP, dysuria, urinary frequency, urinary urgency. No rectal pain. BS stable at home with none >140s. Drinking cranberry juice which he does not believe it helped. Pain is spasmatic and localized. No f/c, N/V, abdominal pain. He takes tramadol for knee pain.   He reports consuming smoked fish last night which may have elevated his BP today. He does not eat salty meals and rarely eats smoked foods.    Medications: Patient's Medications  New Prescriptions   CIPROFLOXACIN (CIPRO) 250 MG TABLET    Take 1 tablet (250 mg total) by mouth 2 (two) times daily.   TIZANIDINE (ZANAFLEX) 4 MG CAPSULE    Take 1 capsule (4 mg total) by mouth 3 (three) times daily as needed for muscle spasms.  Previous Medications   AMLODIPINE (NORVASC) 10 MG TABLET    TAKE 1 TABLET BY MOUTH DAILY   ASPIRIN 81 MG TABLET    Take 81 mg by mouth daily.   ATORVASTATIN (LIPITOR) 10 MG TABLET    Take 1 tablet (10 mg total) by mouth daily.   CARVEDILOL (COREG) 6.25 MG TABLET    Take 1 tablet (6.25 mg total) by mouth 2 (two) times daily with a meal.   CARVEDILOL (COREG) 6.25 MG TABLET    TAKE 1 TABLET (6.25 MG TOTAL) BY MOUTH 2 (TWO) TIMES DAILY WITH A MEAL.   GLIPIZIDE (GLUCOTROL) 5 MG TABLET    Take 1 tablet (5 mg total) by mouth daily before breakfast.   GLUCOSE BLOOD TEST STRIP    Use to test blood sugar twice daily. Dx 250.00   HYDROCHLOROTHIAZIDE (HYDRODIURIL) 50 MG TABLET    Take 50 mg by mouth daily.   IRON POLYSACCHARIDES (NIFEREX) 150 MG CAPSULE    Take 1 capsule (150 mg total) by mouth 2 (two) times daily.   LISINOPRIL (PRINIVIL,ZESTRIL) 40 MG TABLET    Take 1 tablet (40 mg total) by mouth daily.   METFORMIN (GLUCOPHAGE) 500 MG TABLET    Take 500 mg by mouth 2 (two) times daily with a meal.   OMEPRAZOLE (PRILOSEC) 20 MG CAPSULE    Take 1 capsule (20 mg total) by mouth daily.   POTASSIUM CHLORIDE SA (K-DUR,KLOR-CON) 20 MEQ TABLET    Take 1 tablet (20 mEq total) by mouth 2 (two) times daily.   SM SUPER THIN LANCETS 30G MISC    Use to test blood sugar twice daily. Dx  250.00   TRADJENTA 5 MG TABS TABLET    TAKE 1 TABLET BY MOUTH DAILY   TRAMADOL (ULTRAM) 50 MG TABLET    Take 50 mg by mouth every 8 (eight) hours as needed (pain).   VITAMIN B-12 (CYANOCOBALAMIN) 1000 MCG TABLET    Take 1,000 mcg by mouth daily.  Modified Medications   No medications on file  Discontinued Medications   No medications on file     Review of Systems   As above. All other systems negative  Filed Vitals:   02/14/14 1134  BP: 182/100  Pulse: 69  Temp: 98.4 F (36.9 C)  TempSrc: Oral  Resp: 24  Weight: 325 lb 12.8 oz (147.782 kg)  SpO2: 95%   Body mass index is 46.07 kg/(m^2).  Physical Exam  CONSTITUTIONAL: Looks well in NAD. Awake, alert and oriented x 3 CVS: Regular rate without murmur, gallop or rub. LUNGS: CTA b/l no wheezing, rales or rhonchi. ABDOMEN: Bowel sounds present x 4. Soft, nontender, nondistended. No palpable mass or bruit. No CVAT or suprapubic TTP EXTREMITIES: Trace LE edema b/l. Distal pulses palpable. No calf tenderness MUSC: paravertebral lumbar muscle hypertrophy with boggy tissue texture changes  Labs reviewed: Office Visit on 02/14/2014  Component Date Value Ref Range Status  . Color, UA 02/14/2014 dk yellow   Final  . Clarity, UA 02/14/2014 clear   Final  . Glucose, UA 02/14/2014 negative   Final  . Bilirubin, UA 02/14/2014 negative   Final  . Ketones, UA 02/14/2014 negative   Final  . Spec Grav, UA 02/14/2014 1.010   Final  . Blood, UA 02/14/2014 Trace   Final  . pH, UA 02/14/2014 5.0   Final  . Protein, UA 02/14/2014 Negative   Final  . Urobilinogen, UA  02/14/2014 negative   Final  . Nitrite, UA 02/14/2014 Negative   Final  . Leukocytes, UA 02/14/2014 moderate (2+)   Final  Appointment on 11/17/2013  Component Date Value Ref Range Status  . Hgb A1c MFr Bld 11/17/2013 6.3* 4.8 - 5.6 % Final   Comment:          Increased risk for diabetes: 5.7 - 6.4                                   Diabetes: >6.4                                   Glycemic control for adults with diabetes: <7.0  . Est. average glucose Bld gHb Est-m* 11/17/2013 134   Final  . Glucose 11/17/2013 174* 65 - 99 mg/dL Final  . BUN 11/17/2013 21  8 - 27 mg/dL Final  . Creatinine, Ser 11/17/2013 1.28* 0.76 - 1.27 mg/dL Final  . GFR calc non Af Amer 11/17/2013 59* >59 mL/min/1.73 Final  . GFR calc Af Amer 11/17/2013 68  >59 mL/min/1.73 Final  . BUN/Creatinine Ratio 11/17/2013 16  10 - 22 Final  . Sodium 11/17/2013 143  134 - 144 mmol/L Final  . Potassium 11/17/2013 3.4* 3.5 - 5.2 mmol/L Final  . Chloride 11/17/2013 100  97 - 108 mmol/L Final  . CO2 11/17/2013 25  18 - 29 mmol/L Final  . Calcium 11/17/2013 9.1  8.6 - 10.2 mg/dL Final  . Cholesterol, Total 11/17/2013 148  100 - 199 mg/dL Final  . Triglycerides 11/17/2013 101  0 - 149 mg/dL Final  . HDL 11/17/2013 44  >39 mg/dL Final   Comment: According to ATP-III Guidelines, HDL-C >59 mg/dL is considered a                          negative risk factor for CHD.  Marland Kitchen VLDL Cholesterol Cal 11/17/2013 20  5 - 40 mg/dL Final  . LDL Calculated 11/17/2013 84  0 - 99 mg/dL Final  . Chol/HDL Ratio 11/17/2013 3.4  0.0 - 5.0 ratio units Final   Comment:  T. Chol/HDL Ratio                                                                      Men  Women                                                        1/2 Avg.Risk  3.4    3.3                                                            Avg.Risk  5.0    4.4                                                         2X Avg.Risk  9.6    7.1                                                          3X Avg.Risk 23.4   11.0      Assessment/Plan     ICD-9-CM ICD-10-CM   1. Urinary tract infection associated with catheterization of urinary tract, initial encounter 996.64 T83.51XA POC Urinalysis Dipstick   599.0 N39.0 ciprofloxacin (CIPRO) 250 MG tablet   possible urethral stone  2. Muscle spasm of back 724.8 M62.830 tiZANidine (ZANAFLEX) 4 MG capsule  3. Essential hypertension - BP elevated today 401.9 I10   4. DM type 2, uncontrolled, with renal complications 119.14 N82.95     E11.65    - avoid salty foods including smoked foods; continue current medication  - push fluids  - may need urology eval if no better  - f/u with PCP as scheduled   Cristal Qadir S. Perlie Gold  [facility] [address] [place], [place] [address] [phone] Office (Wednesdays and Fridays 8 AM - 5 PM) [phone] Cell (Monday-Friday 8 AM - 5 PM)

## 2014-02-26 ENCOUNTER — Encounter: Payer: 59 | Attending: Physical Medicine & Rehabilitation

## 2014-02-26 ENCOUNTER — Ambulatory Visit (HOSPITAL_BASED_OUTPATIENT_CLINIC_OR_DEPARTMENT_OTHER): Payer: 59 | Admitting: Physical Medicine & Rehabilitation

## 2014-02-26 ENCOUNTER — Encounter: Payer: Self-pay | Admitting: Physical Medicine & Rehabilitation

## 2014-02-26 VITALS — BP 154/84 | HR 80 | Resp 14

## 2014-02-26 DIAGNOSIS — Z5181 Encounter for therapeutic drug level monitoring: Secondary | ICD-10-CM | POA: Diagnosis not present

## 2014-02-26 DIAGNOSIS — M170 Bilateral primary osteoarthritis of knee: Secondary | ICD-10-CM | POA: Diagnosis not present

## 2014-02-26 DIAGNOSIS — M179 Osteoarthritis of knee, unspecified: Secondary | ICD-10-CM

## 2014-02-26 DIAGNOSIS — IMO0002 Reserved for concepts with insufficient information to code with codable children: Secondary | ICD-10-CM

## 2014-02-26 DIAGNOSIS — Z79899 Other long term (current) drug therapy: Secondary | ICD-10-CM

## 2014-02-26 DIAGNOSIS — M1710 Unilateral primary osteoarthritis, unspecified knee: Secondary | ICD-10-CM

## 2014-02-26 MED ORDER — TRAMADOL HCL 50 MG PO TABS: 50.0000 mg | ORAL_TABLET | Freq: Three times a day (TID) | ORAL | Status: DC | PRN

## 2014-02-26 NOTE — Progress Notes (Signed)
Subjective:    Patient ID: Thomas [name], male    DOB: November 13, 1950, 64 y.o.   MRN: [id number]  HPI  Pool ex 5 days per week 5-6am Works out with snorkel and hand fins Has lost weight  Seen by Dr [name] [facility] for  TKR  Pain Inventory Average Pain 7 Pain Right Now 5 My pain is constant and aching  In the last 24 hours, has pain interfered with the following? General activity 8 Relation with others 8 Enjoyment of life 8 What TIME of day is your pain at its worst? morning and daytime Sleep (in general) Poor  Pain is worse with: walking, bending, sitting, standing and some activites Pain improves with: therapy/exercise and medication Relief from Meds: 6  Mobility walk without assistance ability to climb steps?  yes do you drive?  yes  Function disabled: date disabled 07/2011  Neuro/Psych numbness tingling trouble walking depression  Prior Studies Any changes since last visit?  no  Physicians involved in your care Any changes since last visit?  no   History reviewed. No pertinent family history. History   Social History  . Marital Status: Married    Spouse Name: N/A    Number of Children: N/A  . Years of Education: N/A   Social History Main Topics  . Smoking status: Never Smoker   . Smokeless tobacco: Never Used  . Alcohol Use: No     Comment: never  . Drug Use: No  . Sexual Activity: None   Other Topics Concern  . None   Social History Narrative   Past Surgical History  Procedure Laterality Date  . Knee surgery    . Shoulder surgery    . Hernia repair    . Prostate surgery    . Uretha surgery-2014    . Radioactive seed implant     Past Medical History  Diagnosis Date  . Diabetes mellitus   . Hypertension   . Arthritis   . Chronic kidney disease   . Cancer 2010    Prostate   BP 154/84 mmHg  Pulse 80  Resp 14  SpO2 92%  Opioid Risk Score:   Fall Risk Score: Moderate Fall Risk (6-13 points)   Review of Systems    Constitutional: Negative.   HENT: Negative.   Eyes: Negative.   Respiratory: Negative.   Cardiovascular: Negative.   Gastrointestinal: Negative.   Endocrine:       High blood sugar   Musculoskeletal: Positive for myalgias, back pain, arthralgias and neck pain.  Skin: Negative.   Allergic/Immunologic: Negative.   Neurological: Positive for numbness.       Tingling, trouble walking  Hematological: Negative.   Psychiatric/Behavioral: Positive for dysphoric mood.       Objective:   Physical Exam  Constitutional: He is oriented to person, place, and time. He appears well-developed.  Morbid obesity  HENT:  Head: Normocephalic and atraumatic.  Eyes: Conjunctivae and EOM are normal. Pupils are equal, round, and reactive to light.  Musculoskeletal:       Right knee: He exhibits decreased range of motion. No tenderness found.       Left knee: He exhibits decreased range of motion. No tenderness found.  Neurological: He is alert and oriented to person, place, and time. He has normal reflexes.  Nursing note and vitals reviewed.  Patient with 0-90 of flexion bilateral knees. Crepitus at bilateral knees No evidence of effusion at either knee. Mood and affect are appropriate  Assessment & Plan:  1. End stage osteoarthritis of bilateral knees in a pt with morbid obesity, DM, HTN.  Has already tried corticosteroid injections as well as Synvisc. No significant relief. Patient wants to avoid surgery based on his comorbidities Recommend trial of diclofenac gel , Patient afraid of side effects we reassured him that systemic absorption of diclofenac gel is 5% or less Patient has had good relief with tramadol 50 mg 3 times a day.  Continue aquatic exercise encouraged weight loss

## 2014-02-26 NOTE — Patient Instructions (Signed)
Keep up the aquatic exercise  I think the Diclofenac gel is safe for external use

## 2014-02-27 ENCOUNTER — Other Ambulatory Visit: Payer: Self-pay | Admitting: Physical Medicine & Rehabilitation

## 2014-02-27 NOTE — Addendum Note (Signed)
Addended by: Geryl Rankins D on: 02/27/2014 01:46 PM   Modules accepted: Orders

## 2014-02-28 LAB — PMP ALCOHOL METABOLITE (ETG): Ethyl Glucuronide (EtG): NEGATIVE ng/mL

## 2014-03-01 LAB — TRAMADOL, URINE
N-DESMETHYL-CIS-TRAMADOL: 5136 ng/mL (ref <100)
Tramadol, Urine: 15153 ng/mL (ref <100)

## 2014-03-02 LAB — PRESCRIPTION MONITORING PROFILE (SOLSTAS)
Amphetamine/Meth: NEGATIVE ng/mL
Barbiturate Screen, Urine: NEGATIVE ng/mL
Benzodiazepine Screen, Urine: NEGATIVE ng/mL
Buprenorphine, Urine: NEGATIVE ng/mL
Cannabinoid Scrn, Ur: NEGATIVE ng/mL
Carisoprodol, Urine: NEGATIVE ng/mL
Cocaine Metabolites: NEGATIVE ng/mL
Creatinine, Urine: 396.1 mg/dL (ref >20.0)
Fentanyl, Ur: NEGATIVE ng/mL
MDMA URINE: NEGATIVE ng/mL
Meperidine, Ur: NEGATIVE ng/mL
Methadone Screen, Urine: NEGATIVE ng/mL
Nitrites, Initial: NEGATIVE ug/mL
Opiate Screen, Urine: NEGATIVE ng/mL
Oxycodone Screen, Ur: NEGATIVE ng/mL
Propoxyphene: NEGATIVE ng/mL
Tapentadol, urine: NEGATIVE ng/mL
Zolpidem, Urine: NEGATIVE ng/mL
pH, Initial: 5.3 pH (ref 4.5–8.9)

## 2014-03-05 NOTE — Progress Notes (Signed)
Urine drug screen for this encounter is consistent for prescribed medications.   

## 2014-03-13 ENCOUNTER — Telehealth: Payer: Self-pay | Admitting: *Deleted

## 2014-03-13 NOTE — Telephone Encounter (Signed)
Received prior authorization from [facility] # [phone] Fax: [phone] for Tradjenta 5mg . Form given to Dr. Bubba Camp to review and sign.

## 2014-03-15 NOTE — Telephone Encounter (Signed)
Received determination from [facility] and medication was DENIED. Given to Dr. Bubba Camp to Advise.

## 2014-03-26 NOTE — Telephone Encounter (Signed)
Received fax from [facility]. Patient needs to try Januvia, Janumet, Janumet XR, Onglyza or Kombiglyze XR. Given to Dr. Mariea Clonts to determine (Dr. Bubba Camp on vacation)

## 2014-03-26 NOTE — Telephone Encounter (Signed)
Per Dr. Allyson Sabal samples until Dr. Bubba Camp reviews. Patient has CKD due to Diabetes and Tradjenta would be best. Patient Notified and samples given. Put paperwork in Dr. Jackolyn Confer folder to review and sign.

## 2014-03-27 ENCOUNTER — Ambulatory Visit: Payer: 59 | Admitting: Internal Medicine

## 2014-04-06 ENCOUNTER — Other Ambulatory Visit: Payer: Self-pay | Admitting: Internal Medicine

## 2014-04-24 ENCOUNTER — Encounter: Payer: Self-pay | Admitting: Internal Medicine

## 2014-04-24 ENCOUNTER — Ambulatory Visit (INDEPENDENT_AMBULATORY_CARE_PROVIDER_SITE_OTHER): Payer: 59 | Admitting: Internal Medicine

## 2014-04-24 VITALS — BP 162/82 | HR 74 | Temp 98.6°F | Resp 20 | Ht 71.0 in | Wt 321.4 lb

## 2014-04-24 DIAGNOSIS — D509 Iron deficiency anemia, unspecified: Secondary | ICD-10-CM | POA: Diagnosis not present

## 2014-04-24 DIAGNOSIS — M170 Bilateral primary osteoarthritis of knee: Secondary | ICD-10-CM | POA: Diagnosis not present

## 2014-04-24 DIAGNOSIS — I10 Essential (primary) hypertension: Secondary | ICD-10-CM

## 2014-04-24 DIAGNOSIS — E785 Hyperlipidemia, unspecified: Secondary | ICD-10-CM

## 2014-04-24 DIAGNOSIS — J069 Acute upper respiratory infection, unspecified: Secondary | ICD-10-CM | POA: Diagnosis not present

## 2014-04-24 DIAGNOSIS — E1129 Type 2 diabetes mellitus with other diabetic kidney complication: Secondary | ICD-10-CM | POA: Diagnosis not present

## 2014-04-24 DIAGNOSIS — IMO0002 Reserved for concepts with insufficient information to code with codable children: Secondary | ICD-10-CM

## 2014-04-24 DIAGNOSIS — E1165 Type 2 diabetes mellitus with hyperglycemia: Secondary | ICD-10-CM

## 2014-04-24 MED ORDER — HYDRALAZINE HCL 10 MG PO TABS: 10.0000 mg | ORAL_TABLET | Freq: Three times a day (TID) | ORAL | Status: DC

## 2014-04-24 MED ORDER — TRAMADOL HCL 50 MG PO TABS: 50.0000 mg | ORAL_TABLET | Freq: Three times a day (TID) | ORAL | Status: DC | PRN

## 2014-04-24 MED ORDER — OMEPRAZOLE 20 MG PO CPDR: 20.0000 mg | DELAYED_RELEASE_CAPSULE | Freq: Every day | ORAL | Status: DC

## 2014-04-24 MED ORDER — HYDROCHLOROTHIAZIDE 50 MG PO TABS: 50.0000 mg | ORAL_TABLET | Freq: Every day | ORAL | Status: DC

## 2014-04-24 NOTE — Progress Notes (Signed)
Patient ID: Thomas [name], male   DOB: Nov 17, 1950, 64 y.o.   MRN: [id number]    Chief Complaint  Patient presents with  . Medical Management of Chronic Issues    has the start of a cold since Sunday, took Nyquil   No Known Allergies  HPI 64 y/o male pt here for routine visit. He has been having a cold since Sunday, runny nose with clear discharge.  Has some throat discomfort. Has cough- mostly dry. Denies any fever or chills.  cbg readings. cbg 80-145.  Continuing metformin 500 mg bid and glipizide 5 mg daily. Also taking tradjenta In the office BP is elevated, bp this am at home before going swimming was 154/77. Has been taking nyquil and wonders if this could be contributing some. Average bp reading 150-160/70-80 Pain under control with tramadol   Wt Readings from Last 3 Encounters:  04/24/14 321 lb 6.4 oz (145.786 kg)  02/14/14 325 lb 12.8 oz (147.782 kg)  11/22/13 321 lb (145.605 kg)   Review of Systems  Constitutional: Negative for fever, chills, malaise/fatigue and diaphoresis.  HENT: Negative for congestion, hearing loss  Eyes: Negative for blurred vision, double vision and discharge.  Respiratory: Negative for shortness of breath and wheezing.   Cardiovascular: Negative for chest pain, palpitations, orthopnea and leg swelling.  Gastrointestinal: Negative for heartburn, nausea, vomiting, abdominal pain, constipation.  Genitourinary: Negative for dysuria Musculoskeletal: Negative for falls, myalgias.  Skin: Negative for itching and rash.  Neurological: Negative for dizziness, tingling, focal weakness and headaches.  Psychiatric/Behavioral: Negative for depression and memory loss. The patient is not nervous/anxious.  Past Medical History  Diagnosis Date  . Diabetes mellitus   . Hypertension   . Arthritis   . Chronic kidney disease   . Cancer 2010    Prostate      Past Surgical History  Procedure Laterality Date  . Knee surgery    . Shoulder surgery    .  Hernia repair    . Prostate surgery    . Uretha surgery-2014    . Radioactive seed implant     Current Outpatient Prescriptions on File Prior to Visit  Medication Sig Dispense Refill  . amLODipine (NORVASC) 10 MG tablet TAKE 1 TABLET BY MOUTH DAILY 90 tablet 1  . aspirin 81 MG tablet Take 81 mg by mouth daily.    Marland Kitchen atorvastatin (LIPITOR) 10 MG tablet Take 1 tablet (10 mg total) by mouth daily. 90 tablet 3  . carvedilol (COREG) 6.25 MG tablet TAKE 1 TABLET (6.25 MG TOTAL) BY MOUTH 2 (TWO) TIMES DAILY WITH A MEAL. 60 tablet 5  . glipiZIDE (GLUCOTROL) 5 MG tablet Take 1 tablet (5 mg total) by mouth daily before breakfast. 90 tablet 3  . glucose blood test strip Use to test blood sugar twice daily. Dx 250.00 100 each 12  . hydrochlorothiazide (HYDRODIURIL) 50 MG tablet Take 50 mg by mouth daily.    . iron polysaccharides (NIFEREX) 150 MG capsule Take 1 capsule (150 mg total) by mouth 2 (two) times daily. 60 capsule 3  . lisinopril (PRINIVIL,ZESTRIL) 40 MG tablet Take 1 tablet (40 mg total) by mouth daily. 90 tablet 3  . metFORMIN (GLUCOPHAGE) 1000 MG tablet TAKE 1/2 TABLET BY MOUTH 2 TIMES A DAY WITH A MEAL 60 tablet 11  . omeprazole (PRILOSEC) 20 MG capsule Take 1 capsule (20 mg total) by mouth daily. 30 capsule 3  . potassium chloride SA (K-DUR,KLOR-CON) 20 MEQ tablet Take 1 tablet (20 mEq total)  by mouth 2 (two) times daily. 60 tablet 3  . SM SUPER THIN LANCETS 30G MISC Use to test blood sugar twice daily. Dx  250.00 100 each 12  . tiZANidine (ZANAFLEX) 4 MG capsule Take 1 capsule (4 mg total) by mouth 3 (three) times daily as needed for muscle spasms. 300 capsule 0  . traMADol (ULTRAM) 50 MG tablet Take 1 tablet (50 mg total) by mouth every 8 (eight) hours as needed (pain). 90 tablet 5  . vitamin B-12 (CYANOCOBALAMIN) 1000 MCG tablet Take 1,000 mcg by mouth daily.    . metFORMIN (GLUCOPHAGE) 500 MG tablet Take 500 mg by mouth 2 (two) times daily with a meal.    . TRADJENTA 5 MG TABS tablet  TAKE 1 TABLET BY MOUTH DAILY (Patient not taking: Reported on 04/24/2014) 90 tablet 3   No current facility-administered medications on file prior to visit.   Physical exam BP 162/82 mmHg  Pulse 74  Temp(Src) 98.6 F (37 C) (Oral)  Resp 20  Ht 5\' 11"  (1.803 m)  Wt 321 lb 6.4 oz (145.786 kg)  BMI 44.85 kg/m2  SpO2 95%  Wt Readings from Last 3 Encounters:  04/24/14 321 lb 6.4 oz (145.786 kg)  02/14/14 325 lb 12.8 oz (147.782 kg)  11/22/13 321 lb (145.605 kg)   Constitutional: He is oriented to person, place, and time. obese HENT:   Head: Normocephalic and atraumatic.  Right Ear: External ear normal.  Left Ear: External ear normal.  Eyes: Conjunctivae and EOM are normal. Pupils are equal, round, and reactive to light.  Neck: Normal range of motion and phonation normal. Neck supple.  Oral: no thrush, normal oropharynx Cardiovascular: Normal rate, regular rhythm, normal heart sounds and intact distal pulses.   Pulmonary/Chest: Effort normal and breath sounds normal. He exhibits no bony tenderness.  Abdominal: Soft. There is no tenderness.  Musculoskeletal: Normal range of motion.  Neurological: He is alert and oriented to person, place, and time.  Skin: Skin is warm, dry and intact.  Psychiatric: He has a normal mood and affect.   Labs  CBC Latest Ref Rng 08/24/2012 11/16/2011 12/06/2009  WBC 3.4 - 10.8 x10E3/uL 9.4 - 8.0  Hemoglobin 12.6 - 17.7 g/dL 13.3 15.0 10.8(L)  Hematocrit 37.5 - 51.0 % 39.8 44.0 34.2(L)  Platelets 150 - 400 K/uL - - 290   CMP Latest Ref Rng 11/17/2013 08/29/2013 08/17/2013  Glucose 65 - 99 mg/dL 174(H) 181(H) 108(H)  BUN 8 - 27 mg/dL 21 18 11   Creatinine 0.76 - 1.27 mg/dL 1.28(H) 1.14 1.18  Sodium 134 - 144 mmol/L 143 139 146(H)  Potassium 3.5 - 5.2 mmol/L 3.4(L) 3.3(L) 3.3(L)  Chloride 97 - 108 mmol/L 100 95(L) 101  CO2 18 - 29 mmol/L 25 24 24   Calcium 8.6 - 10.2 mg/dL 9.1 9.0 9.0  Total Protein 6.0 - 8.5 g/dL - - -  Albumin 3.6 - 4.8 g/dL - - -    Total Bilirubin 0.0 - 1.2 mg/dL - - -  Alkaline Phos 39 - 117 IU/L - - -  AST 0 - 40 IU/L - - -  ALT 0 - 44 IU/L - - -   Lab Results  Component Value Date   HGBA1C 6.3* 11/17/2013   Lipid Panel     Component Value Date/Time   CHOL 148 11/17/2013 0826   TRIG 101 11/17/2013 0826   HDL 44 11/17/2013 0826   CHOLHDL 3.4 11/17/2013 0826   LDLCALC 84 11/17/2013 0826    Assessment/plan  1. DM type 2, uncontrolled, with renal complications Check X9B toda. Continue glipizide, metformin and tradjenta current regimen for now - CMP - Lipid Panel - CBC with Differential - Hemoglobin A1c  2. Essential hypertension Elevated bp reading. D/c amlodipine. Start hydralazine 10 mg tid and continue lisinopril, hctz and coreg current regimen. Monitor bp at home. Warning signs with elevated bp reading mentioned  3. Hyperlipidemia with target LDL less than 100 Lipid panel today, continue lipitor 10 mg daily  4. Anemia, iron deficiency Continue iron supplement, cbc today  5. Primary osteoarthritis of both knees On prn tramadol given by pain  clinic - CBC with Differential  6. Severe obesity (BMI >= 40) Has lost few pounds, encouraged exercise 30 min 5 days a week and dietary changes  7. Acute upper respiratory infection Likely viral, advised on hydration, rest, mucinex dm prn for his cough and cephacol prn for throat discomfort

## 2014-04-25 LAB — CBC WITH DIFFERENTIAL/PLATELET
Basophils Absolute: 0 10*3/uL (ref 0.0–0.2)
Basos: 0 %
Eos: 1 %
Eosinophils Absolute: 0.1 10*3/uL (ref 0.0–0.4)
HCT: 38.4 % (ref 37.5–51.0)
Hemoglobin: 12.8 g/dL (ref 12.6–17.7)
Immature Grans (Abs): 0 10*3/uL (ref 0.0–0.1)
Immature Granulocytes: 0 %
Lymphocytes Absolute: 2.3 10*3/uL (ref 0.7–3.1)
Lymphs: 20 %
MCH: 27 pg (ref 26.6–33.0)
MCHC: 33.3 g/dL (ref 31.5–35.7)
MCV: 81 fL (ref 79–97)
Monocytes Absolute: 0.9 10*3/uL (ref 0.1–0.9)
Monocytes: 8 %
Neutrophils Absolute: 8 10*3/uL — ABNORMAL HIGH (ref 1.4–7.0)
Neutrophils Relative %: 71 %
Platelets: 286 10*3/uL (ref 150–379)
RBC: 4.74 x10E6/uL (ref 4.14–5.80)
RDW: 14.9 % (ref 12.3–15.4)
WBC: 11.3 10*3/uL — ABNORMAL HIGH (ref 3.4–10.8)

## 2014-04-25 LAB — LIPID PANEL
Chol/HDL Ratio: 3.1 ratio units (ref 0.0–5.0)
Cholesterol, Total: 150 mg/dL (ref 100–199)
HDL: 49 mg/dL (ref >39)
LDL Calculated: 83 mg/dL (ref 0–99)
Triglycerides: 91 mg/dL (ref 0–149)
VLDL Cholesterol Cal: 18 mg/dL (ref 5–40)

## 2014-04-25 LAB — COMPREHENSIVE METABOLIC PANEL
ALT: 15 IU/L (ref 0–44)
AST: 13 IU/L (ref 0–40)
Albumin/Globulin Ratio: 1.3 (ref 1.1–2.5)
Albumin: 4.1 g/dL (ref 3.6–4.8)
Alkaline Phosphatase: 64 IU/L (ref 39–117)
BUN/Creatinine Ratio: 19 (ref 10–22)
BUN: 18 mg/dL (ref 8–27)
Bilirubin Total: 0.5 mg/dL (ref 0.0–1.2)
CO2: 24 mmol/L (ref 18–29)
Calcium: 9.1 mg/dL (ref 8.6–10.2)
Chloride: 104 mmol/L (ref 97–108)
Creatinine, Ser: 0.96 mg/dL (ref 0.76–1.27)
GFR calc Af Amer: 97 mL/min/{1.73_m2} (ref >59)
GFR calc non Af Amer: 84 mL/min/{1.73_m2} (ref >59)
Globulin, Total: 3.2 g/dL (ref 1.5–4.5)
Glucose: 83 mg/dL (ref 65–99)
Potassium: 3.1 mmol/L — ABNORMAL LOW (ref 3.5–5.2)
Sodium: 145 mmol/L — ABNORMAL HIGH (ref 134–144)
Total Protein: 7.3 g/dL (ref 6.0–8.5)

## 2014-04-25 LAB — HEMOGLOBIN A1C
Est. average glucose Bld gHb Est-mCnc: 137 mg/dL
Hgb A1c MFr Bld: 6.4 % — ABNORMAL HIGH (ref 4.8–5.6)

## 2014-04-26 ENCOUNTER — Telehealth: Payer: Self-pay | Admitting: *Deleted

## 2014-04-26 MED ORDER — AMOXICILLIN-POT CLAVULANATE 875-125 MG PO TABS: ORAL_TABLET | ORAL | Status: DC

## 2014-04-26 NOTE — Telephone Encounter (Signed)
Send 10 days supply of augmentin 875 mg bid to his pharmacy. thanks

## 2014-04-26 NOTE — Telephone Encounter (Signed)
Patient Notified and agreed and faxed to pharmacy

## 2014-04-26 NOTE — Telephone Encounter (Signed)
Patient stated he was seen yesterday and now when he blows his nose it is yellowish/greenish and want an antibiotic called into pharmacy. Has cough, no fever Pharmacy: [facility].

## 2014-04-27 ENCOUNTER — Telehealth: Payer: Self-pay

## 2014-04-27 MED ORDER — POTASSIUM CHLORIDE CRYS ER 20 MEQ PO TBCR: EXTENDED_RELEASE_TABLET | ORAL | Status: DC

## 2014-04-27 NOTE — Telephone Encounter (Signed)
-----   Message from Blanchie Serve, MD sent at 04/26/2014  5:13 PM EST ----- Your sugar is under control and cholesterol level is good. Your sodium level is mildly increased. Encourage hydration. Your potassium level is low, your bp medication hydrochlorthiazide is likely contributing to this. Start kcl 20 meq bid x 3 days, then 20 meq daily. Send script to pharmacy. Your white count is slightly elevated and this could be from your sinus infection. You have been started on antibiotics today, continue and complete the course

## 2014-04-27 NOTE — Telephone Encounter (Signed)
Discussed with patient, patient verbalized understanding of results. RX sent to the pharmacy, copy of report mailed.

## 2014-05-01 DIAGNOSIS — C61 Malignant neoplasm of prostate: Secondary | ICD-10-CM | POA: Diagnosis not present

## 2014-05-09 DIAGNOSIS — R339 Retention of urine, unspecified: Secondary | ICD-10-CM | POA: Diagnosis not present

## 2014-05-09 DIAGNOSIS — N320 Bladder-neck obstruction: Secondary | ICD-10-CM | POA: Diagnosis not present

## 2014-05-09 DIAGNOSIS — C61 Malignant neoplasm of prostate: Secondary | ICD-10-CM | POA: Diagnosis not present

## 2014-05-19 ENCOUNTER — Encounter (HOSPITAL_BASED_OUTPATIENT_CLINIC_OR_DEPARTMENT_OTHER): Payer: Self-pay | Admitting: Emergency Medicine

## 2014-05-19 ENCOUNTER — Emergency Department (HOSPITAL_BASED_OUTPATIENT_CLINIC_OR_DEPARTMENT_OTHER): Payer: 59

## 2014-05-19 ENCOUNTER — Emergency Department (HOSPITAL_BASED_OUTPATIENT_CLINIC_OR_DEPARTMENT_OTHER)
Admission: EM | Admit: 2014-05-19 | Discharge: 2014-05-19 | Disposition: A | Discharge status: Home / Self Care | Payer: 59 | Attending: Emergency Medicine | Admitting: Emergency Medicine

## 2014-05-19 DIAGNOSIS — E876 Hypokalemia: Secondary | ICD-10-CM | POA: Insufficient documentation

## 2014-05-19 DIAGNOSIS — I129 Hypertensive chronic kidney disease with stage 1 through stage 4 chronic kidney disease, or unspecified chronic kidney disease: Secondary | ICD-10-CM | POA: Diagnosis present

## 2014-05-19 DIAGNOSIS — Z8546 Personal history of malignant neoplasm of prostate: Secondary | ICD-10-CM | POA: Diagnosis not present

## 2014-05-19 DIAGNOSIS — E119 Type 2 diabetes mellitus without complications: Secondary | ICD-10-CM | POA: Diagnosis not present

## 2014-05-19 DIAGNOSIS — Z79899 Other long term (current) drug therapy: Secondary | ICD-10-CM | POA: Diagnosis not present

## 2014-05-19 DIAGNOSIS — I159 Secondary hypertension, unspecified: Secondary | ICD-10-CM | POA: Insufficient documentation

## 2014-05-19 DIAGNOSIS — M1990 Unspecified osteoarthritis, unspecified site: Secondary | ICD-10-CM | POA: Diagnosis not present

## 2014-05-19 DIAGNOSIS — R51 Headache: Secondary | ICD-10-CM | POA: Diagnosis not present

## 2014-05-19 DIAGNOSIS — N189 Chronic kidney disease, unspecified: Secondary | ICD-10-CM | POA: Diagnosis not present

## 2014-05-19 DIAGNOSIS — Z7982 Long term (current) use of aspirin: Secondary | ICD-10-CM | POA: Insufficient documentation

## 2014-05-19 LAB — CBC WITH DIFFERENTIAL/PLATELET
Basophils Absolute: 0 10*3/uL (ref 0.0–0.1)
Basophils Relative: 1 % (ref 0–1)
Eosinophils Absolute: 0.1 10*3/uL (ref 0.0–0.7)
Eosinophils Relative: 1 % (ref 0–5)
HCT: 39.8 % (ref 39.0–52.0)
Hemoglobin: 12.9 g/dL — ABNORMAL LOW (ref 13.0–17.0)
Lymphocytes Relative: 23 % (ref 12–46)
Lymphs Abs: 1.9 10*3/uL (ref 0.7–4.0)
MCH: 26.7 pg (ref 26.0–34.0)
MCHC: 32.4 g/dL (ref 30.0–36.0)
MCV: 82.2 fL (ref 78.0–100.0)
Monocytes Absolute: 0.6 10*3/uL (ref 0.1–1.0)
Monocytes Relative: 8 % (ref 3–12)
Neutro Abs: 5.6 10*3/uL (ref 1.7–7.7)
Neutrophils Relative %: 67 % (ref 43–77)
Platelets: 307 10*3/uL (ref 150–400)
RBC: 4.84 MIL/uL (ref 4.22–5.81)
RDW: 14.4 % (ref 11.5–15.5)
WBC: 8.3 10*3/uL (ref 4.0–10.5)

## 2014-05-19 LAB — BASIC METABOLIC PANEL
Anion gap: 9 (ref 5–15)
BUN: 17 mg/dL (ref 6–23)
CO2: 30 mmol/L (ref 19–32)
Calcium: 9 mg/dL (ref 8.4–10.5)
Chloride: 101 mmol/L (ref 96–112)
Creatinine, Ser: 1.03 mg/dL (ref 0.50–1.35)
GFR calc Af Amer: 87 mL/min — ABNORMAL LOW (ref >90)
GFR calc non Af Amer: 75 mL/min — ABNORMAL LOW (ref >90)
Glucose, Bld: 99 mg/dL (ref 70–99)
Potassium: 2.9 mmol/L — ABNORMAL LOW (ref 3.5–5.1)
Sodium: 140 mmol/L (ref 135–145)

## 2014-05-19 LAB — URINALYSIS, ROUTINE W REFLEX MICROSCOPIC
Bilirubin Urine: NEGATIVE
Glucose, UA: NEGATIVE mg/dL
Hgb urine dipstick: NEGATIVE
Ketones, ur: NEGATIVE mg/dL
Leukocytes, UA: NEGATIVE
Nitrite: NEGATIVE
Protein, ur: NEGATIVE mg/dL
Specific Gravity, Urine: 1.019 (ref 1.005–1.030)
Urobilinogen, UA: 0.2 mg/dL (ref 0.0–1.0)
pH: 5.5 (ref 5.0–8.0)

## 2014-05-19 MED ORDER — POTASSIUM CHLORIDE 20 MEQ/15ML (10%) PO SOLN
40.0000 meq | Freq: Once | ORAL | Status: AC
  Administered 2014-05-19: 40 meq via ORAL
  Filled 2014-05-19: qty 30

## 2014-05-19 NOTE — Discharge Instructions (Signed)
Make sure to watch your diet. Take your potassium, yours was slightly low today. Follow up with your doctor in 2-3 days.    Hypertension Hypertension, commonly called high blood pressure, is when the force of blood pumping through your arteries is too strong. Your arteries are the blood vessels that carry blood from your heart throughout your body. A blood pressure reading consists of a higher number over a lower number, such as 110/72. The higher number (systolic) is the pressure inside your arteries when your heart pumps. The lower number (diastolic) is the pressure inside your arteries when your heart relaxes. Ideally you want your blood pressure below 120/80. Hypertension forces your heart to work harder to pump blood. Your arteries may become narrow or stiff. Having hypertension puts you at risk for heart disease, stroke, and other problems.  RISK FACTORS Some risk factors for high blood pressure are controllable. Others are not.  Risk factors you cannot control include:   Race. You may be at higher risk if you are African American.  Age. Risk increases with age.  Gender. Men are at higher risk than women before age 27 years. After age 28, women are at higher risk than men. Risk factors you can control include:  Not getting enough exercise or physical activity.  Being overweight.  Getting too much fat, sugar, calories, or salt in your diet.  Drinking too much alcohol. SIGNS AND SYMPTOMS Hypertension does not usually cause signs or symptoms. Extremely high blood pressure (hypertensive crisis) may cause headache, anxiety, shortness of breath, and nosebleed. DIAGNOSIS  To check if you have hypertension, your health care provider will measure your blood pressure while you are seated, with your arm held at the level of your heart. It should be measured at least twice using the same arm. Certain conditions can cause a difference in blood pressure between your right and left arms. A blood  pressure reading that is higher than normal on one occasion does not mean that you need treatment. If one blood pressure reading is high, ask your health care provider about having it checked again. TREATMENT  Treating high blood pressure includes making lifestyle changes and possibly taking medicine. Living a healthy lifestyle can help lower high blood pressure. You may need to change some of your habits. Lifestyle changes may include:  Following the DASH diet. This diet is high in fruits, vegetables, and whole grains. It is low in salt, red meat, and added sugars.  Getting at least 2 hours of brisk physical activity every week.  Losing weight if necessary.  Not smoking.  Limiting alcoholic beverages.  Learning ways to reduce stress. If lifestyle changes are not enough to get your blood pressure under control, your health care provider may prescribe medicine. You may need to take more than one. Work closely with your health care provider to understand the risks and benefits. HOME CARE INSTRUCTIONS  Have your blood pressure rechecked as directed by your health care provider.   Take medicines only as directed by your health care provider. Follow the directions carefully. Blood pressure medicines must be taken as prescribed. The medicine does not work as well when you skip doses. Skipping doses also puts you at risk for problems.   Do not smoke.   Monitor your blood pressure at home as directed by your health care provider. SEEK MEDICAL CARE IF:   You think you are having a reaction to medicines taken.  You have recurrent headaches or feel dizzy.  You  have swelling in your ankles.  You have trouble with your vision. SEEK IMMEDIATE MEDICAL CARE IF:  You develop a severe headache or confusion.  You have unusual weakness, numbness, or feel faint.  You have severe chest or abdominal pain.  You vomit repeatedly.  You have trouble breathing. MAKE SURE YOU:   Understand  these instructions.  Will watch your condition.  Will get help right away if you are not doing well or get worse. Document Released: 02/02/2005 Document Revised: 06/19/2013 Document Reviewed: 11/25/2012 [facility] Patient Information 2015 [place], [place]. This information is not intended to replace advice given to you by your health care provider. Make sure you discuss any questions you have with your health care provider.

## 2014-05-19 NOTE — ED Provider Notes (Signed)
CSN: [id number]     Arrival date & time 05/19/14  1214 History   First MD Initiated Contact with Patient 05/19/14 1229     Chief Complaint  Patient presents with  . Hypertension     (Consider location/radiation/quality/duration/timing/severity/associated sxs/prior Treatment) HPI Thomas [name] is a 64 y.o. male with hx of HTN, DM, presents to ED with complaint of hypertension. Pt states he has had a dull headache that started gradually 2 days ago. States it made him check his BP. Reports last night his BP got up to 212/98. States his blood pressure has never been this high. He admits to not eating "very well" lately. He also just got over a viral URI, states was taking OTC cold and flu medications and augmentin, but has not had any medications in 4 days. He denies chest pain, SOB, neck pain, nausea, vomiting.   Past Medical History  Diagnosis Date  . Diabetes mellitus   . Hypertension   . Arthritis   . Chronic kidney disease   . Cancer 2010    Prostate   Past Surgical History  Procedure Laterality Date  . Knee surgery    . Shoulder surgery    . Hernia repair    . Prostate surgery    . Uretha surgery-2014    . Radioactive seed implant     No family history on file. History  Substance Use Topics  . Smoking status: Never Smoker   . Smokeless tobacco: Never Used  . Alcohol Use: No     Comment: never    Review of Systems  Constitutional: Negative for fever and chills.  Respiratory: Negative for cough, chest tightness and shortness of breath.   Cardiovascular: Negative for chest pain, palpitations and leg swelling.  Gastrointestinal: Negative for nausea, vomiting, abdominal pain, diarrhea and abdominal distention.  Genitourinary: Negative for dysuria, urgency, frequency and hematuria.  Musculoskeletal: Negative for myalgias, arthralgias, neck pain and neck stiffness.  Skin: Negative for rash.  Allergic/Immunologic: Negative for immunocompromised state.  Neurological:  Positive for headaches. Negative for dizziness, weakness, light-headedness and numbness.      Allergies  Review of patient's allergies indicates no known allergies.  Home Medications   Prior to Admission medications   Medication Sig Start Date End Date Taking? Authorizing Provider  amoxicillin-clavulanate (AUGMENTIN) 875-125 MG per tablet Take one tablet by mouth twice daily for infection 04/26/14   Blanchie Serve, MD  aspirin 81 MG tablet Take 81 mg by mouth daily.    Historical Provider, MD  atorvastatin (LIPITOR) 10 MG tablet Take 1 tablet (10 mg total) by mouth daily. 05/02/13   Blanchie Serve, MD  carvedilol (COREG) 6.25 MG tablet TAKE 1 TABLET (6.25 MG TOTAL) BY MOUTH 2 (TWO) TIMES DAILY WITH A MEAL. 12/27/13   Blanchie Serve, MD  glipiZIDE (GLUCOTROL) 5 MG tablet Take 1 tablet (5 mg total) by mouth daily before breakfast. 11/22/13   Blanchie Serve, MD  glucose blood test strip Use to test blood sugar twice daily. Dx 250.00 07/04/13   Blanchie Serve, MD  hydrALAZINE (APRESOLINE) 10 MG tablet Take 1 tablet (10 mg total) by mouth 3 (three) times daily. 04/24/14   Blanchie Serve, MD  hydrochlorothiazide (HYDRODIURIL) 50 MG tablet Take 1 tablet (50 mg total) by mouth daily. 04/24/14   Blanchie Serve, MD  iron polysaccharides (NIFEREX) 150 MG capsule Take 1 capsule (150 mg total) by mouth 2 (two) times daily. 02/07/14   Blanchie Serve, MD  lisinopril (PRINIVIL,ZESTRIL) 40 MG tablet Take 1  tablet (40 mg total) by mouth daily. 11/22/13   Blanchie Serve, MD  metFORMIN (GLUCOPHAGE) 500 MG tablet Take 500 mg by mouth 2 (two) times daily with a meal.    Historical Provider, MD  omeprazole (PRILOSEC) 20 MG capsule Take 1 capsule (20 mg total) by mouth daily. 04/24/14   Blanchie Serve, MD  potassium chloride SA (K-DUR,KLOR-CON) 20 MEQ tablet 1 by mouth twice daily x 3 days, then 1 by mouth daily 04/27/14   Blanchie Serve, MD  SM SUPER THIN LANCETS 30G MISC Use to test blood sugar twice daily. Dx  250.00 07/04/13   Blanchie Serve, MD  tiZANidine (ZANAFLEX) 4 MG capsule Take 1 capsule (4 mg total) by mouth 3 (three) times daily as needed for muscle spasms. 02/14/14   Monica Carter, DO  TRADJENTA 5 MG TABS tablet TAKE 1 TABLET BY MOUTH DAILY Patient not taking: Reported on 04/24/2014 11/20/13   Tiffany L Reed, DO  traMADol (ULTRAM) 50 MG tablet Take 1 tablet (50 mg total) by mouth every 8 (eight) hours as needed (pain). 04/24/14   Blanchie Serve, MD  vitamin B-12 (CYANOCOBALAMIN) 1000 MCG tablet Take 1,000 mcg by mouth daily.    Historical Provider, MD   BP 167/78 mmHg  Pulse 64  Temp(Src) 98.5 F (36.9 C) (Oral)  Resp 18  Ht 5\' 9"  (1.753 m)  Wt 322 lb (146.058 kg)  BMI 47.53 kg/m2  SpO2 94% Physical Exam  Constitutional: He appears well-developed and well-nourished. No distress.  HENT:  Head: Normocephalic and atraumatic.  Eyes: Conjunctivae are normal.  Neck: Neck supple.  Cardiovascular: Normal rate, regular rhythm and normal heart sounds.   Pulmonary/Chest: Effort normal. No respiratory distress. He has no wheezes. He has no rales.  Abdominal: Soft. Bowel sounds are normal. He exhibits no distension. There is no tenderness. There is no rebound.  Musculoskeletal: He exhibits no edema.  Neurological: He is alert.  Skin: Skin is warm and dry.  Nursing note and vitals reviewed.   ED Course  Procedures (including critical care time) Labs Review Labs Reviewed  CBC WITH DIFFERENTIAL/PLATELET - Abnormal; Notable for the following:    Hemoglobin 12.9 (*)    All other components within normal limits  BASIC METABOLIC PANEL - Abnormal; Notable for the following:    Potassium 2.9 (*)    GFR calc non Af Amer 75 (*)    GFR calc Af Amer 87 (*)    All other components within normal limits  URINALYSIS, ROUTINE W REFLEX MICROSCOPIC    Imaging Review Ct Head Wo Contrast  05/19/2014   CLINICAL DATA:  Hypertension, frontal headache for 2 days.  EXAM: CT HEAD WITHOUT CONTRAST  TECHNIQUE: Contiguous axial images  were obtained from the base of the skull through the vertex without contrast.  COMPARISON:  10/29/2013  FINDINGS: Stable brain atrophy and chronic white matter microvascular ischemic changes throughout the cerebrum. No acute intracranial hemorrhage, definite infarction, mass lesion, midline shift, herniation, hydrocephalus, or extra-axial fluid collection. No focal mass effect or edema. Normal gray-white matter differentiation. Cisterns are patent. No cerebellar abnormality. Atherosclerosis of the intracranial vessels at the skull plate. Orbits are symmetric. Mastoids and sinuses remain clear. No skull abnormality. Stable 8 mm sclerotic focus in the left mandibular condyle, bone island is favored.  IMPRESSION: Stable atrophy and chronic white matter microvascular ischemic changes. No acute finding or interval change by noncontrast CT.   Electronically Signed   By: Jerilynn Mages.  Shick M.D.   On: 05/19/2014 14:14  EKG Interpretation None      MDM   Final diagnoses:  Secondary hypertension, unspecified  Hypokalemia   Pt with elevated blood pressure at home. Mild headache, no other complaints. Here BP 160s/70s. Pt non toxic appearing. He has made a lot of progress in his health in the last year, including now swimming several days a week, lowered A1C down to 6.4 from 8.8 a year ago. He is veryworried. Will check labs, CT head.   3:11 PM Patient's lab work showed hypokalemia, 40 mEq given in emergency department. Patient is supposed to be taking potassium, but states he did not take it today. Patient CT is negative. He is nontoxic-appearing. Will discharge home, follow-up with primary care doctor in 2 days for recheck of blood pressure. Return precautions discussed  Filed Vitals:   05/19/14 1220 05/19/14 1258  BP: 169/83 167/78  Pulse: 71 64  Temp: 98.5 F (36.9 C)   TempSrc: Oral   Resp: 20 18  Height: 5\' 9"  (1.753 m)   Weight: 322 lb (146.058 kg)   SpO2: 98% 94%       Jeannett Senior,  PA-C 05/19/14 1514  Evelina Bucy, MD 05/19/14 1524

## 2014-05-19 NOTE — ED Notes (Signed)
64 yo male with Blood Pressure readings at home 212/98 took bp meds at home. Experienced dizziness yesterday morning. Currently A/O

## 2014-05-19 NOTE — ED Notes (Signed)
Pt self-cathed  

## 2014-05-19 NOTE — ED Notes (Signed)
PA at bedside.

## 2014-05-23 ENCOUNTER — Encounter: Payer: 59 | Admitting: Internal Medicine

## 2014-05-23 ENCOUNTER — Encounter: Payer: Self-pay | Admitting: Internal Medicine

## 2014-05-23 VITALS — BP 146/76 | HR 65 | Temp 98.4°F | Resp 20 | Ht 71.0 in | Wt 319.8 lb

## 2014-05-24 ENCOUNTER — Ambulatory Visit (INDEPENDENT_AMBULATORY_CARE_PROVIDER_SITE_OTHER): Payer: 59 | Admitting: Nurse Practitioner

## 2014-05-24 ENCOUNTER — Encounter: Payer: Self-pay | Admitting: Nurse Practitioner

## 2014-05-24 VITALS — BP 142/80 | HR 65 | Temp 98.4°F | Resp 16 | Ht 71.0 in | Wt 320.0 lb

## 2014-05-24 DIAGNOSIS — IMO0002 Reserved for concepts with insufficient information to code with codable children: Secondary | ICD-10-CM

## 2014-05-24 DIAGNOSIS — E1165 Type 2 diabetes mellitus with hyperglycemia: Secondary | ICD-10-CM

## 2014-05-24 DIAGNOSIS — I10 Essential (primary) hypertension: Secondary | ICD-10-CM | POA: Diagnosis not present

## 2014-05-24 DIAGNOSIS — E1129 Type 2 diabetes mellitus with other diabetic kidney complication: Secondary | ICD-10-CM

## 2014-05-24 MED ORDER — HYDROCHLOROTHIAZIDE 25 MG PO TABS: 25.0000 mg | ORAL_TABLET | Freq: Every day | ORAL | Status: DC

## 2014-05-24 MED ORDER — OMEPRAZOLE 20 MG PO CPDR: 20.0000 mg | DELAYED_RELEASE_CAPSULE | ORAL | Status: DC | PRN

## 2014-05-24 MED ORDER — HYDRALAZINE HCL 10 MG PO TABS: 10.0000 mg | ORAL_TABLET | Freq: Three times a day (TID) | ORAL | Status: DC

## 2014-05-24 MED ORDER — AMLODIPINE BESYLATE 10 MG PO TABS: 10.0000 mg | ORAL_TABLET | Freq: Every day | ORAL | Status: DC

## 2014-05-24 NOTE — Patient Instructions (Addendum)
Do NOT take medications for cold that have the ingredients Phenylephrine or pseudoephedrine  Cont amlodipine and ADD hydralazine back Take Hydralazine 10 mg three times a day

## 2014-05-24 NOTE — Progress Notes (Signed)
Patient ID: Thomas [name], male   DOB: Jul 22, 1950, 64 y.o.   MRN: [id number]    PCP: Blanchie Serve, MD  No Known Allergies  Chief Complaint  Patient presents with  . Acute Visit    Elevated B/P concerns, seen in ER on Satuday. Patient restarted Norvasc 10 mg on his own due to elevated B/P and d/c H   . Medication Management    Tradjenta not covered, choose alternative Januvia or Onglyza      HPI: Patient is a 64 y.o. male seen in the office today to follow up ED visit.  Was seen in office in March, elevated blood pressure, Dr Bubba Camp took him off norvasc and started him on hydralazine TID, which he was taking. Blood pressure then was 180-190/90s. Went as high as 200/99 so he went to the ED (also was feeling weird in his head, CT done which was negative) . Reported it was high and potassium was low, repleted potassium, and told him to follow up.  Decided at that point blood pressure was better on norvasc 10 mg and stopped the hydralazine. Other blood pressure medications Carvedilol 6.25 mg twice daily, HCTZ 50 mg daily lisinopril 40 mg daily  Eating a low sodium diet exercising 5 days a week- in the pool at the [facility] for about an hour Pt had a cold prior to seeing Dr Bubba Camp -- took SEVERE cold and flu which has phenylephrine in it.   Has not been on tradjenta for over a month, insurance not covering. Blood sugars in 100-115, following atkins diet    Review of Systems:  Review of Systems  Constitutional: Negative for activity change, appetite change, fatigue and unexpected weight change.  HENT: Negative for congestion and hearing loss.   Eyes: Negative.   Respiratory: Negative for cough and shortness of breath.   Cardiovascular: Negative for chest pain, palpitations and leg swelling.  Gastrointestinal: Negative for abdominal pain, diarrhea and constipation.  Genitourinary: Negative for dysuria.  Skin: Negative for color change and wound.  Neurological: Positive for headaches.  Negative for dizziness and weakness.  Psychiatric/Behavioral: Negative for behavioral problems, confusion and agitation.    Past Medical History  Diagnosis Date  . Diabetes mellitus   . Hypertension   . Arthritis   . Chronic kidney disease   . Cancer 2010    Prostate   Past Surgical History  Procedure Laterality Date  . Knee surgery    . Shoulder surgery    . Hernia repair    . Prostate surgery    . Uretha surgery-2014    . Radioactive seed implant     Social History:   reports that he has never smoked. He has never used smokeless tobacco. He reports that he does not drink alcohol or use illicit drugs.  History reviewed. No pertinent family history.  Medications: Patient's Medications  New Prescriptions   No medications on file  Previous Medications   AMLODIPINE (NORVASC) 10 MG TABLET    Take 10 mg by mouth daily.   ASPIRIN 81 MG TABLET    Take 81 mg by mouth daily.   ATORVASTATIN (LIPITOR) 10 MG TABLET    Take 1 tablet (10 mg total) by mouth daily.   CARVEDILOL (COREG) 6.25 MG TABLET    TAKE 1 TABLET (6.25 MG TOTAL) BY MOUTH 2 (TWO) TIMES DAILY WITH A MEAL.   GLIPIZIDE (GLUCOTROL) 5 MG TABLET    Take 1 tablet (5 mg total) by mouth daily before breakfast.  GLUCOSE BLOOD TEST STRIP    Use to test blood sugar twice daily. Dx 250.00   HYDROCHLOROTHIAZIDE (HYDRODIURIL) 50 MG TABLET    Take 1 tablet (50 mg total) by mouth daily.   IRON POLYSACCHARIDES (NIFEREX) 150 MG CAPSULE    Take 1 capsule (150 mg total) by mouth 2 (two) times daily.   LISINOPRIL (PRINIVIL,ZESTRIL) 40 MG TABLET    Take 1 tablet (40 mg total) by mouth daily.   METFORMIN (GLUCOPHAGE) 500 MG TABLET    Take 500 mg by mouth 2 (two) times daily with a meal.   POTASSIUM CHLORIDE SA (K-DUR,KLOR-CON) 20 MEQ TABLET    1 by mouth twice daily x 3 days, then 1 by mouth daily   SM SUPER THIN LANCETS 30G MISC    Use to test blood sugar twice daily. Dx  250.00   TIZANIDINE (ZANAFLEX) 4 MG CAPSULE    Take 1 capsule (4 mg  total) by mouth 3 (three) times daily as needed for muscle spasms.   TRAMADOL (ULTRAM) 50 MG TABLET    Take 1 tablet (50 mg total) by mouth every 8 (eight) hours as needed (pain).   VITAMIN B-12 (CYANOCOBALAMIN) 1000 MCG TABLET    Take 1,000 mcg by mouth daily.  Modified Medications   Modified Medication Previous Medication   OMEPRAZOLE (PRILOSEC) 20 MG CAPSULE omeprazole (PRILOSEC) 20 MG capsule      Take 1 capsule (20 mg total) by mouth as needed.    Take 1 capsule (20 mg total) by mouth daily.  Discontinued Medications   AMOXICILLIN-CLAVULANATE (AUGMENTIN) 875-125 MG PER TABLET    Take one tablet by mouth twice daily for infection   HYDRALAZINE (APRESOLINE) 10 MG TABLET    Take 1 tablet (10 mg total) by mouth 3 (three) times daily.   TRADJENTA 5 MG TABS TABLET    TAKE 1 TABLET BY MOUTH DAILY     Physical Exam:  Filed Vitals:   05/24/14 1008  BP: 142/80  Pulse: 65  Temp: 98.4 F (36.9 C)  TempSrc: Oral  Resp: 16  Height: 5\' 11"  (1.803 m)  Weight: 320 lb (145.151 kg)  SpO2: 92%    Physical Exam  Constitutional: He appears well-developed and well-nourished. No distress.  HENT:  Head: Normocephalic and atraumatic.  Neck: Normal range of motion.  Cardiovascular: Normal rate, regular rhythm and normal heart sounds.   Pulmonary/Chest: Effort normal and breath sounds normal.  Musculoskeletal: He exhibits no edema.  Neurological: He is alert.  Skin: Skin is warm and dry. He is not diaphoretic.  Psychiatric: He has a normal mood and affect.    Labs reviewed: Basic Metabolic Panel:  Recent Labs  11/17/13 0826 04/24/14 1617 05/19/14 1335  NA 143 145* 140  K 3.4* 3.1* 2.9*  CL 100 104 101  CO2 25 24 30   GLUCOSE 174* 83 99  BUN 21 18 17   CREATININE 1.28* 0.96 1.03  CALCIUM 9.1 9.1 9.0   Liver Function Tests:  Recent Labs  04/24/14 1617  AST 13  ALT 15  ALKPHOS 64  BILITOT 0.5  PROT 7.3   No results for input(s): LIPASE, AMYLASE in the last 8760 hours. No  results for input(s): AMMONIA in the last 8760 hours. CBC:  Recent Labs  04/24/14 1617 05/19/14 1335  WBC 11.3* 8.3  NEUTROABS 8.0* 5.6  HGB 12.8 12.9*  HCT 38.4 39.8  MCV 81 82.2  PLT 286 307   Lipid Panel:  Recent Labs  11/17/13 0826 04/24/14 1617  CHOL 148 150  HDL 44 49  LDLCALC 84 83  TRIG 101 91  CHOLHDL 3.4 3.1   TSH: No results for input(s): TSH in the last 8760 hours. A1C: Lab Results  Component Value Date   HGBA1C 6.4* 04/24/2014     Assessment/Plan 1. Essential hypertension -not at goal, currently with diet and exercise modifications  -pt stopped hydralazine and start Norvasc -will add hydralazine to current regimen -cont dietary modifications and exercise  - hydrALAZINE (APRESOLINE) 10 MG tablet; Take 1 tablet (10 mg total) by mouth 3 (three) times daily.  Dispense: 90 tablet; Refill: 3 - amLODipine (NORVASC) 10 MG tablet; Take 1 tablet (10 mg total) by mouth daily.  Dispense: 30 tablet; Refill: 3 - hydrochlorothiazide (HYDRODIURIL) 25 MG tablet; Take 1 tablet (25 mg total) by mouth daily.  Dispense: 30 tablet; Refill: 3 -conts coreg twice daily  -educated not to take flu and cold medications with Phenylephrine or pseudoephedrine  2. DM type 2, uncontrolled, with renal complications -blood sugars appear to be in therapeutic range without tradjenta. -will dc trajenta (not covered) -to cont lifestyle modifications, metformin and glipizide   3. Hypokalemia Potassium supplemented in the hospital, educated pt on need to take potassium daily, will recheck BMP prior to next appt  Follow up in 2 weeks

## 2014-06-01 ENCOUNTER — Other Ambulatory Visit: Payer: 59

## 2014-06-01 DIAGNOSIS — I10 Essential (primary) hypertension: Secondary | ICD-10-CM

## 2014-06-02 LAB — BASIC METABOLIC PANEL
BUN/Creatinine Ratio: 16 (ref 10–22)
BUN: 16 mg/dL (ref 8–27)
CO2: 25 mmol/L (ref 18–29)
Calcium: 8.8 mg/dL (ref 8.6–10.2)
Chloride: 101 mmol/L (ref 97–108)
Creatinine, Ser: 0.97 mg/dL (ref 0.76–1.27)
GFR calc Af Amer: 96 mL/min/{1.73_m2} (ref >59)
GFR calc non Af Amer: 83 mL/min/{1.73_m2} (ref >59)
Glucose: 236 mg/dL — ABNORMAL HIGH (ref 65–99)
Potassium: 3.9 mmol/L (ref 3.5–5.2)
Sodium: 144 mmol/L (ref 134–144)

## 2014-06-05 ENCOUNTER — Ambulatory Visit: Payer: 59 | Admitting: Nurse Practitioner

## 2014-06-05 ENCOUNTER — Encounter: Payer: Self-pay | Admitting: Nurse Practitioner

## 2014-06-07 ENCOUNTER — Ambulatory Visit: Payer: Medicare Other | Admitting: Nurse Practitioner

## 2014-06-07 DIAGNOSIS — R339 Retention of urine, unspecified: Secondary | ICD-10-CM | POA: Diagnosis not present

## 2014-06-07 DIAGNOSIS — C61 Malignant neoplasm of prostate: Secondary | ICD-10-CM | POA: Diagnosis not present

## 2014-06-07 DIAGNOSIS — N320 Bladder-neck obstruction: Secondary | ICD-10-CM | POA: Diagnosis not present

## 2014-06-11 DIAGNOSIS — R339 Retention of urine, unspecified: Secondary | ICD-10-CM | POA: Diagnosis not present

## 2014-06-12 DIAGNOSIS — N320 Bladder-neck obstruction: Secondary | ICD-10-CM | POA: Diagnosis not present

## 2014-06-12 DIAGNOSIS — C61 Malignant neoplasm of prostate: Secondary | ICD-10-CM | POA: Diagnosis not present

## 2014-06-28 DIAGNOSIS — C61 Malignant neoplasm of prostate: Secondary | ICD-10-CM | POA: Diagnosis not present

## 2014-06-28 DIAGNOSIS — R339 Retention of urine, unspecified: Secondary | ICD-10-CM | POA: Diagnosis not present

## 2014-07-07 NOTE — Progress Notes (Signed)
This encounter was created in error - please disregard.

## 2014-07-09 DIAGNOSIS — N359 Urethral stricture, unspecified: Secondary | ICD-10-CM | POA: Diagnosis not present

## 2014-07-09 DIAGNOSIS — N390 Urinary tract infection, site not specified: Secondary | ICD-10-CM | POA: Insufficient documentation

## 2014-07-09 DIAGNOSIS — C61 Malignant neoplasm of prostate: Secondary | ICD-10-CM | POA: Diagnosis not present

## 2014-07-11 DIAGNOSIS — Z01818 Encounter for other preprocedural examination: Secondary | ICD-10-CM | POA: Diagnosis not present

## 2014-07-13 ENCOUNTER — Other Ambulatory Visit: Payer: Self-pay | Admitting: *Deleted

## 2014-07-13 DIAGNOSIS — N320 Bladder-neck obstruction: Secondary | ICD-10-CM | POA: Diagnosis not present

## 2014-07-13 DIAGNOSIS — M1990 Unspecified osteoarthritis, unspecified site: Secondary | ICD-10-CM | POA: Diagnosis not present

## 2014-07-13 DIAGNOSIS — Z7982 Long term (current) use of aspirin: Secondary | ICD-10-CM | POA: Diagnosis not present

## 2014-07-13 DIAGNOSIS — Z79899 Other long term (current) drug therapy: Secondary | ICD-10-CM | POA: Diagnosis not present

## 2014-07-13 DIAGNOSIS — N329 Bladder disorder, unspecified: Secondary | ICD-10-CM | POA: Diagnosis not present

## 2014-07-13 DIAGNOSIS — R339 Retention of urine, unspecified: Secondary | ICD-10-CM | POA: Diagnosis not present

## 2014-07-13 DIAGNOSIS — R338 Other retention of urine: Secondary | ICD-10-CM | POA: Diagnosis not present

## 2014-07-13 DIAGNOSIS — E119 Type 2 diabetes mellitus without complications: Secondary | ICD-10-CM | POA: Diagnosis not present

## 2014-07-13 DIAGNOSIS — E669 Obesity, unspecified: Secondary | ICD-10-CM | POA: Diagnosis not present

## 2014-07-13 DIAGNOSIS — N359 Urethral stricture, unspecified: Secondary | ICD-10-CM | POA: Diagnosis not present

## 2014-07-13 DIAGNOSIS — N138 Other obstructive and reflux uropathy: Secondary | ICD-10-CM | POA: Diagnosis not present

## 2014-07-13 DIAGNOSIS — D494 Neoplasm of unspecified behavior of bladder: Secondary | ICD-10-CM | POA: Diagnosis not present

## 2014-07-13 DIAGNOSIS — C61 Malignant neoplasm of prostate: Secondary | ICD-10-CM | POA: Diagnosis not present

## 2014-07-13 DIAGNOSIS — N390 Urinary tract infection, site not specified: Secondary | ICD-10-CM | POA: Diagnosis not present

## 2014-07-13 DIAGNOSIS — N401 Enlarged prostate with lower urinary tract symptoms: Secondary | ICD-10-CM | POA: Diagnosis not present

## 2014-07-13 DIAGNOSIS — G4730 Sleep apnea, unspecified: Secondary | ICD-10-CM | POA: Diagnosis not present

## 2014-07-13 DIAGNOSIS — I10 Essential (primary) hypertension: Secondary | ICD-10-CM | POA: Diagnosis not present

## 2014-07-13 DIAGNOSIS — N19 Unspecified kidney failure: Secondary | ICD-10-CM | POA: Diagnosis not present

## 2014-07-13 DIAGNOSIS — E785 Hyperlipidemia, unspecified: Secondary | ICD-10-CM | POA: Diagnosis not present

## 2014-07-13 DIAGNOSIS — N400 Enlarged prostate without lower urinary tract symptoms: Secondary | ICD-10-CM | POA: Diagnosis not present

## 2014-07-13 MED ORDER — ATORVASTATIN CALCIUM 10 MG PO TABS: ORAL_TABLET | ORAL | Status: DC

## 2014-07-13 NOTE — Telephone Encounter (Signed)
[facility].  

## 2014-07-14 DIAGNOSIS — C61 Malignant neoplasm of prostate: Secondary | ICD-10-CM | POA: Diagnosis not present

## 2014-07-14 DIAGNOSIS — N138 Other obstructive and reflux uropathy: Secondary | ICD-10-CM | POA: Diagnosis not present

## 2014-07-14 DIAGNOSIS — N329 Bladder disorder, unspecified: Secondary | ICD-10-CM | POA: Diagnosis not present

## 2014-07-14 DIAGNOSIS — N359 Urethral stricture, unspecified: Secondary | ICD-10-CM | POA: Diagnosis not present

## 2014-07-14 DIAGNOSIS — N320 Bladder-neck obstruction: Secondary | ICD-10-CM | POA: Diagnosis not present

## 2014-07-14 DIAGNOSIS — N401 Enlarged prostate with lower urinary tract symptoms: Secondary | ICD-10-CM | POA: Diagnosis not present

## 2014-07-25 DIAGNOSIS — R339 Retention of urine, unspecified: Secondary | ICD-10-CM | POA: Diagnosis not present

## 2014-07-25 DIAGNOSIS — N400 Enlarged prostate without lower urinary tract symptoms: Secondary | ICD-10-CM | POA: Insufficient documentation

## 2014-07-25 DIAGNOSIS — N401 Enlarged prostate with lower urinary tract symptoms: Secondary | ICD-10-CM | POA: Insufficient documentation

## 2014-07-25 DIAGNOSIS — N138 Other obstructive and reflux uropathy: Secondary | ICD-10-CM | POA: Insufficient documentation

## 2014-07-25 DIAGNOSIS — C61 Malignant neoplasm of prostate: Secondary | ICD-10-CM | POA: Diagnosis not present

## 2014-07-27 ENCOUNTER — Ambulatory Visit (INDEPENDENT_AMBULATORY_CARE_PROVIDER_SITE_OTHER): Payer: 59 | Admitting: Internal Medicine

## 2014-07-27 ENCOUNTER — Encounter: Payer: Self-pay | Admitting: Internal Medicine

## 2014-07-27 VITALS — BP 116/68 | HR 73 | Temp 98.2°F | Resp 20 | Ht 71.0 in | Wt 319.2 lb

## 2014-07-27 DIAGNOSIS — G629 Polyneuropathy, unspecified: Secondary | ICD-10-CM | POA: Diagnosis not present

## 2014-07-27 DIAGNOSIS — E876 Hypokalemia: Secondary | ICD-10-CM | POA: Diagnosis not present

## 2014-07-27 DIAGNOSIS — E1142 Type 2 diabetes mellitus with diabetic polyneuropathy: Secondary | ICD-10-CM | POA: Diagnosis not present

## 2014-07-27 DIAGNOSIS — E785 Hyperlipidemia, unspecified: Secondary | ICD-10-CM | POA: Diagnosis not present

## 2014-07-27 DIAGNOSIS — I10 Essential (primary) hypertension: Secondary | ICD-10-CM

## 2014-07-27 NOTE — Progress Notes (Signed)
Patient ID: Thomas [name], male   DOB: 28-Jul-1950, 64 y.o.   MRN: [id number]    Location:    [facility]   Place of Service:   OFFICE  Chief Complaint  Patient presents with  . Medical Management of Chronic Issues    3 month follow-up  . OTHER    Patient says he needs a new blood glucose moniter. put new one in room to see if it can be used by patient     HPI:  64 yo male seen today for f/u. He reports feeling well overall. He was seen emergently by urology  due to urinary retention and urethral stricture that req'd surgical repair. He had suprapubic cath x 1 month which has been reversed.  He is unable to check BS at home as his meter "got missing". No low BS reactions. Numbness in his toes. No tingling.  He is back swimming now.  Past Medical History  Diagnosis Date  . Diabetes mellitus   . Hypertension   . Arthritis   . Chronic kidney disease   . Cancer 2010    Prostate    Past Surgical History  Procedure Laterality Date  . Knee surgery    . Shoulder surgery    . Hernia repair    . Prostate surgery    . Uretha surgery-2014    . Radioactive seed implant      Patient Care Team: [name], NP as PCP - General (Nurse Practitioner)  History   Social History  . Marital Status: Married    Spouse Name: N/A  . Number of Children: N/A  . Years of Education: N/A   Occupational History  . Not on file.   Social History Main Topics  . Smoking status: Never Smoker   . Smokeless tobacco: Never Used  . Alcohol Use: No     Comment: never  . Drug Use: No  . Sexual Activity: Not on file   Other Topics Concern  . Not on file   Social History Narrative     reports that he has never smoked. He has never used smokeless tobacco. He reports that he does not drink alcohol or use illicit drugs.  No Known Allergies  Medications: Patient's Medications  New Prescriptions   No medications on file  Previous Medications   AMLODIPINE (NORVASC) 10 MG TABLET     Take 1 tablet (10 mg total) by mouth daily.   ASPIRIN 81 MG TABLET    Take 81 mg by mouth daily.   ATORVASTATIN (LIPITOR) 10 MG TABLET    Take one tablet by mouth once daily for cholesterol   CARVEDILOL (COREG) 6.25 MG TABLET    TAKE 1 TABLET (6.25 MG TOTAL) BY MOUTH 2 (TWO) TIMES DAILY WITH A MEAL.   GLIPIZIDE (GLUCOTROL) 5 MG TABLET    Take 1 tablet (5 mg total) by mouth daily before breakfast.   GLUCOSE BLOOD TEST STRIP    Use to test blood sugar twice daily. Dx 250.00   HYDRALAZINE (APRESOLINE) 10 MG TABLET    Take 1 tablet (10 mg total) by mouth 3 (three) times daily.   HYDROCHLOROTHIAZIDE (HYDRODIURIL) 25 MG TABLET    Take 1 tablet (25 mg total) by mouth daily.   IRON POLYSACCHARIDES (NIFEREX) 150 MG CAPSULE    Take 1 capsule (150 mg total) by mouth 2 (two) times daily.   LISINOPRIL (PRINIVIL,ZESTRIL) 40 MG TABLET    Take 1 tablet (40 mg total) by mouth daily.  METFORMIN (GLUCOPHAGE) 500 MG TABLET    Take 500 mg by mouth 2 (two) times daily with a meal.   OMEPRAZOLE (PRILOSEC) 20 MG CAPSULE    Take 1 capsule (20 mg total) by mouth as needed.   POTASSIUM CHLORIDE SA (K-DUR,KLOR-CON) 20 MEQ TABLET    1 by mouth twice daily x 3 days, then 1 by mouth daily   SM SUPER THIN LANCETS 30G MISC    Use to test blood sugar twice daily. Dx  250.00   TIZANIDINE (ZANAFLEX) 4 MG CAPSULE    Take 1 capsule (4 mg total) by mouth 3 (three) times daily as needed for muscle spasms.   TRAMADOL (ULTRAM) 50 MG TABLET    Take 1 tablet (50 mg total) by mouth every 8 (eight) hours as needed (pain).   VITAMIN B-12 (CYANOCOBALAMIN) 1000 MCG TABLET    Take 1,000 mcg by mouth daily.  Modified Medications   No medications on file  Discontinued Medications   No medications on file    Review of Systems  Constitutional: Negative for chills, activity change and fatigue.  HENT: Negative for sore throat and trouble swallowing.   Eyes: Negative for visual disturbance.  Respiratory: Negative for cough, chest tightness and  shortness of breath.   Cardiovascular: Negative for chest pain, palpitations and leg swelling.  Gastrointestinal: Negative for nausea, vomiting, abdominal pain and blood in stool.  Genitourinary: Negative for urgency, frequency and difficulty urinating.  Musculoskeletal: Negative for arthralgias and gait problem.  Skin: Negative for rash.  Neurological: Positive for numbness. Negative for weakness and headaches.  Psychiatric/Behavioral: Negative for confusion and sleep disturbance. The patient is not nervous/anxious.     Filed Vitals:   07/27/14 0904  BP: 140/72  Pulse: 73  Temp: 98.2 F (36.8 C)  TempSrc: Oral  Resp: 20  Height: $Remove'5\' 11"'LRhADVI$  (1.803 m)  Weight: 319 lb 3.2 oz (144.788 kg)  SpO2: 94%   Body mass index is 44.54 kg/(m^2).  Physical Exam  Constitutional: He is oriented to person, place, and time. He appears well-developed and well-nourished. No distress.  HENT:  Mouth/Throat: Oropharynx is clear and moist.  Eyes: Pupils are equal, round, and reactive to light. No scleral icterus.  Neck: Neck supple. Carotid bruit is not present. No thyromegaly present.  Cardiovascular: Normal rate, regular rhythm, normal heart sounds and intact distal pulses.  Exam reveals no gallop and no friction rub.   No murmur heard. no distal LE swelling. No calf TTP  Pulmonary/Chest: Effort normal and breath sounds normal. He has no wheezes. He has no rales. He exhibits no tenderness.  Abdominal: Soft. Bowel sounds are normal. He exhibits no distension, no abdominal bruit, no pulsatile midline mass and no mass. There is no tenderness. There is no rebound and no guarding.  Lymphadenopathy:    He has no cervical adenopathy.  Neurological: He is alert and oriented to person, place, and time. He has normal reflexes.  Skin: Skin is warm and dry. No rash noted.  Psychiatric: He has a normal mood and affect. His behavior is normal. Judgment and thought content normal.     Labs reviewed: Appointment  on 06/01/2014  Component Date Value Ref Range Status  . Glucose 06/01/2014 236* 65 - 99 mg/dL Final  . BUN 06/01/2014 16  8 - 27 mg/dL Final  . Creatinine, Ser 06/01/2014 0.97  0.76 - 1.27 mg/dL Final  . GFR calc non Af Amer 06/01/2014 83  >59 mL/min/1.73 Final  . GFR calc Af Wyvonnia Lora  06/01/2014 96  >59 mL/min/1.73 Final  . BUN/Creatinine Ratio 06/01/2014 16  10 - 22 Final  . Sodium 06/01/2014 144  134 - 144 mmol/L Final  . Potassium 06/01/2014 3.9  3.5 - 5.2 mmol/L Final  . Chloride 06/01/2014 101  97 - 108 mmol/L Final  . CO2 06/01/2014 25  18 - 29 mmol/L Final  . Calcium 06/01/2014 8.8  8.6 - 10.2 mg/dL Final  Admission on 05/19/2014, Discharged on 05/19/2014  Component Date Value Ref Range Status  . WBC 05/19/2014 8.3  4.0 - 10.5 K/uL Final  . RBC 05/19/2014 4.84  4.22 - 5.81 MIL/uL Final  . Hemoglobin 05/19/2014 12.9* 13.0 - 17.0 g/dL Final  . HCT 05/19/2014 39.8  39.0 - 52.0 % Final  . MCV 05/19/2014 82.2  78.0 - 100.0 fL Final  . MCH 05/19/2014 26.7  26.0 - 34.0 pg Final  . MCHC 05/19/2014 32.4  30.0 - 36.0 g/dL Final  . RDW 05/19/2014 14.4  11.5 - 15.5 % Final  . Platelets 05/19/2014 307  150 - 400 K/uL Final  . Neutrophils Relative % 05/19/2014 67  43 - 77 % Final  . Neutro Abs 05/19/2014 5.6  1.7 - 7.7 K/uL Final  . Lymphocytes Relative 05/19/2014 23  12 - 46 % Final  . Lymphs Abs 05/19/2014 1.9  0.7 - 4.0 K/uL Final  . Monocytes Relative 05/19/2014 8  3 - 12 % Final  . Monocytes Absolute 05/19/2014 0.6  0.1 - 1.0 K/uL Final  . Eosinophils Relative 05/19/2014 1  0 - 5 % Final  . Eosinophils Absolute 05/19/2014 0.1  0.0 - 0.7 K/uL Final  . Basophils Relative 05/19/2014 1  0 - 1 % Final  . Basophils Absolute 05/19/2014 0.0  0.0 - 0.1 K/uL Final  . Sodium 05/19/2014 140  135 - 145 mmol/L Final  . Potassium 05/19/2014 2.9* 3.5 - 5.1 mmol/L Final  . Chloride 05/19/2014 101  96 - 112 mmol/L Final  . CO2 05/19/2014 30  19 - 32 mmol/L Final  . Glucose, Bld 05/19/2014 99  70 - 99  mg/dL Final  . BUN 05/19/2014 17  6 - 23 mg/dL Final  . Creatinine, Ser 05/19/2014 1.03  0.50 - 1.35 mg/dL Final  . Calcium 05/19/2014 9.0  8.4 - 10.5 mg/dL Final  . GFR calc non Af Amer 05/19/2014 75* >90 mL/min Final  . GFR calc Af Amer 05/19/2014 87* >90 mL/min Final   Comment: (NOTE) The eGFR has been calculated using the CKD EPI equation. This calculation has not been validated in all clinical situations. eGFR's persistently <90 mL/min signify possible Chronic Kidney Disease.   . Anion gap 05/19/2014 9  5 - 15 Final  . Color, Urine 05/19/2014 YELLOW  YELLOW Final  . APPearance 05/19/2014 CLEAR  CLEAR Final  . Specific Gravity, Urine 05/19/2014 1.019  1.005 - 1.030 Final  . pH 05/19/2014 5.5  5.0 - 8.0 Final  . Glucose, UA 05/19/2014 NEGATIVE  NEGATIVE mg/dL Final  . Hgb urine dipstick 05/19/2014 NEGATIVE  NEGATIVE Final  . Bilirubin Urine 05/19/2014 NEGATIVE  NEGATIVE Final  . Ketones, ur 05/19/2014 NEGATIVE  NEGATIVE mg/dL Final  . Protein, ur 05/19/2014 NEGATIVE  NEGATIVE mg/dL Final  . Urobilinogen, UA 05/19/2014 0.2  0.0 - 1.0 mg/dL Final  . Nitrite 05/19/2014 NEGATIVE  NEGATIVE Final  . Leukocytes, UA 05/19/2014 NEGATIVE  NEGATIVE Final   MICROSCOPIC NOT DONE ON URINES WITH NEGATIVE PROTEIN, BLOOD, LEUKOCYTES, NITRITE, OR GLUCOSE <1000 mg/dL.  No results found.   Assessment/Plan   ICD-9-CM ICD-10-CM   1. DM type 2 with diabetic peripheral neuropathy 250.60 E11.42 CMP   357.2 G62.9 Hemoglobin A1c  2. Essential hypertension 401.9 I10   3. Hyperlipidemia with target LDL less than 100 272.4 E78.5 Lipid Panel  4. Severe obesity (BMI >= 40) 278.01 E66.01   5. Hypokalemia 276.8 E87.6    --Continue current medications as ordered  --Follow up with specialists as scheduled  --resume diet/exercise program  --Follow up in 4 mos for routine visit. Check fasting labs prior to next appt   [facility]. Perlie Gold  [facility] and Adult  Medicine [address] [place], [place] [address] [phone] Cell (Monday-Friday 8 AM - 5 PM) [phone] After 5 PM and follow prompts

## 2014-07-29 NOTE — Patient Instructions (Signed)
Continue current medications as ordered  Follow up with specialists as scheduled  Follow up in 4 mos for routine visit. Check fasting labs prior to next appt

## 2014-08-27 ENCOUNTER — Encounter: Payer: 59 | Attending: Physical Medicine & Rehabilitation

## 2014-08-27 ENCOUNTER — Ambulatory Visit: Payer: 59 | Admitting: Physical Medicine & Rehabilitation

## 2014-08-29 ENCOUNTER — Other Ambulatory Visit: Payer: 59

## 2014-08-29 DIAGNOSIS — E785 Hyperlipidemia, unspecified: Secondary | ICD-10-CM

## 2014-08-29 DIAGNOSIS — E1142 Type 2 diabetes mellitus with diabetic polyneuropathy: Secondary | ICD-10-CM

## 2014-08-30 LAB — COMPREHENSIVE METABOLIC PANEL
ALT: 8 IU/L (ref 0–44)
AST: 13 IU/L (ref 0–40)
Albumin/Globulin Ratio: 1.3 (ref 1.1–2.5)
Albumin: 3.9 g/dL (ref 3.6–4.8)
Alkaline Phosphatase: 82 IU/L (ref 39–117)
BUN/Creatinine Ratio: 16 (ref 10–22)
BUN: 14 mg/dL (ref 8–27)
Bilirubin Total: 0.5 mg/dL (ref 0.0–1.2)
CO2: 26 mmol/L (ref 18–29)
Calcium: 8.7 mg/dL (ref 8.6–10.2)
Chloride: 99 mmol/L (ref 97–108)
Creatinine, Ser: 0.89 mg/dL (ref 0.76–1.27)
GFR calc Af Amer: 104 mL/min/{1.73_m2} (ref >59)
GFR calc non Af Amer: 90 mL/min/{1.73_m2} (ref >59)
Globulin, Total: 3 g/dL (ref 1.5–4.5)
Glucose: 293 mg/dL — ABNORMAL HIGH (ref 65–99)
Potassium: 3.9 mmol/L (ref 3.5–5.2)
Sodium: 143 mmol/L (ref 134–144)
Total Protein: 6.9 g/dL (ref 6.0–8.5)

## 2014-08-30 LAB — HEMOGLOBIN A1C
Est. average glucose Bld gHb Est-mCnc: 186 mg/dL
Hgb A1c MFr Bld: 8.1 % — ABNORMAL HIGH (ref 4.8–5.6)

## 2014-08-30 LAB — LIPID PANEL
Chol/HDL Ratio: 3.1 ratio units (ref 0.0–5.0)
Cholesterol, Total: 165 mg/dL (ref 100–199)
HDL: 53 mg/dL (ref >39)
LDL Calculated: 96 mg/dL (ref 0–99)
Triglycerides: 82 mg/dL (ref 0–149)
VLDL Cholesterol Cal: 16 mg/dL (ref 5–40)

## 2014-08-31 ENCOUNTER — Telehealth: Payer: Self-pay

## 2014-08-31 MED ORDER — GLIPIZIDE 5 MG PO TABS: 5.0000 mg | ORAL_TABLET | Freq: Two times a day (BID) | ORAL | Status: DC

## 2014-08-31 NOTE — Telephone Encounter (Signed)
-----   Message from [place], [place] sent at 08/30/2014 12:29 PM EDT ----- Diabetes is uncontrolled - increase glipizide 5mg  #180 take 1 po BID with 1RF; cont metformin as ordered; check BS BID; cholesterol is excellent; other labs stable; f/u as scheduled

## 2014-08-31 NOTE — Telephone Encounter (Signed)
Discussed with patient, patient verbalized understanding of results. Patient will see Oretha Ellis to f/u on DM education and use of meter. New rx sent in for Glipizide 5 mg. Copy of labs to be given at pending appointment

## 2014-09-03 ENCOUNTER — Encounter: Payer: Self-pay | Admitting: Pharmacotherapy

## 2014-09-03 ENCOUNTER — Ambulatory Visit (INDEPENDENT_AMBULATORY_CARE_PROVIDER_SITE_OTHER): Payer: 59 | Admitting: Pharmacotherapy

## 2014-09-03 VITALS — BP 130/78 | HR 79 | Temp 98.3°F | Resp 20 | Ht 71.0 in | Wt 317.6 lb

## 2014-09-03 DIAGNOSIS — I10 Essential (primary) hypertension: Secondary | ICD-10-CM | POA: Diagnosis not present

## 2014-09-03 DIAGNOSIS — G629 Polyneuropathy, unspecified: Secondary | ICD-10-CM | POA: Diagnosis not present

## 2014-09-03 DIAGNOSIS — E1142 Type 2 diabetes mellitus with diabetic polyneuropathy: Secondary | ICD-10-CM

## 2014-09-03 MED ORDER — EMPAGLIFLOZIN 10 MG PO TABS: 10.0000 mg | ORAL_TABLET | Freq: Every day | ORAL | Status: DC

## 2014-09-03 NOTE — Progress Notes (Signed)
  Subjective:    Thomas [name] is a 64 y.o.African American male who presents for follow-up of Type 2 diabetes mellitus.   He is currently taking glipizide and metformin. A1C 8.1% Has not been SMBG - didn't know how to use meter. Random BG today is 224mg /dl  He is following Atkins diet. He is swimming everyday for exercise. He has stopped drinking juice. Has peripheral neuropathy in feet. Wears corrective lenses.   Denies problems with vision.  Eye exam current in 2016. Nocturia several times per night (at least 6 times) Has peripheral edema Has polydipsia.  Diet sodas, unsweet tea, water. Denies polyphagia. Has polyuria. Has had DM x 7 years  Review of Systems A comprehensive review of systems was negative except for: Eyes: positive for contacts/glasses Cardiovascular: positive for lower extremity edema Genitourinary: positive for nocturia Neurological: positive for peripheral neuropathy Endocrine: positive for diabetic symptoms including polydipsia and polyuria    Objective:    BP 130/78 mmHg  Pulse 79  Temp(Src) 98.3 F (36.8 C) (Oral)  Resp 20  Ht 5\' 11"  (1.803 m)  Wt 317 lb 9.6 oz (144.062 kg)  BMI 44.32 kg/m2  SpO2 93%  General:  alert, cooperative and no distress  Oropharynx: normal findings: lips normal without lesions and gums healthy   Eyes:  negative findings: lids and lashes normal and conjunctivae and sclerae normal   Ears:  external ears normal        Lung: clear to auscultation bilaterally  Heart:  regular rate and rhythm     Extremities: edema bilateral LE  Skin: dry     Neuro: mental status, speech normal, alert and oriented x3 and gait and station normal   Lab Review GLUCOSE (mg/dL)  Date Value  08/29/2014 293*  06/01/2014 236*  04/24/2014 83   GLUCOSE, BLD (mg/dL)  Date Value  05/19/2014 99  11/16/2011 328*  12/06/2009 232*   CO2 (mmol/L)  Date Value  08/29/2014 26  06/01/2014 25  05/19/2014 30   BUN (mg/dL)  Date  Value  08/29/2014 14  06/01/2014 16  05/19/2014 17  04/24/2014 18  11/16/2011 10  12/06/2009 10   CREATININE, SER (mg/dL)  Date Value  08/29/2014 0.89  06/01/2014 0.97  05/19/2014 1.03       Assessment:    Diabetes Mellitus type II, under fair control. A1C above target <7% BP at goal <140/90   Plan:    1.  Rx changes: 1.  stop glipizide.  2.  start Jardiance 10mg  once daily.  Counseled on risk / benefit. 2.  Continue metformin. 3.  Counseled on nutrition goals and meal planning. 4.  Counseled on benefit of routine exercise. Goal is 30-45 minutes 5 x week. 5.  Counseled on complications of uncontrolled DM. 6.  Counseled on insulin resistance. 7.  BP at goal.

## 2014-09-12 ENCOUNTER — Other Ambulatory Visit: Payer: Self-pay | Admitting: Internal Medicine

## 2014-10-04 ENCOUNTER — Other Ambulatory Visit: Payer: Self-pay | Admitting: Internal Medicine

## 2014-10-08 ENCOUNTER — Ambulatory Visit (INDEPENDENT_AMBULATORY_CARE_PROVIDER_SITE_OTHER): Payer: 59 | Admitting: Pharmacotherapy

## 2014-10-08 ENCOUNTER — Encounter: Payer: Self-pay | Admitting: Pharmacotherapy

## 2014-10-08 VITALS — BP 138/80 | HR 68 | Temp 98.3°F | Resp 20 | Ht 71.0 in | Wt 311.4 lb

## 2014-10-08 DIAGNOSIS — E1142 Type 2 diabetes mellitus with diabetic polyneuropathy: Secondary | ICD-10-CM | POA: Diagnosis not present

## 2014-10-08 DIAGNOSIS — Z23 Encounter for immunization: Secondary | ICD-10-CM

## 2014-10-08 DIAGNOSIS — G629 Polyneuropathy, unspecified: Secondary | ICD-10-CM

## 2014-10-08 DIAGNOSIS — I10 Essential (primary) hypertension: Secondary | ICD-10-CM | POA: Diagnosis not present

## 2014-10-08 MED ORDER — GLUCOSE BLOOD VI STRP: ORAL_STRIP | Status: DC

## 2014-10-08 NOTE — Progress Notes (Signed)
  Subjective:    Thomas [name] is a 64 y.o.African American male who presents for follow-up of Type 2 diabetes mellitus.   Last OV glipizide was stopped.  Jardiance 10mg  daily started. Continues metformin.  Having some low back pain. Denies dysuria Going to the pool daily.  Has been doing more physical activity. Eating healthy choices.  He reports average BG:  90-110 range. No hypoglycemia.  Denies problems with feet.  Has numbness in feet. Denies problems with vision Nocturia 3 times per night. Staying well hydrated.    Review of Systems A comprehensive review of systems was negative except for: Cardiovascular: positive for lower extremity edema Genitourinary: positive for nocturia Musculoskeletal: positive for back pain Neurological: positive for numbness in feet    Objective:    BP 138/80 mmHg  Pulse 68  Temp(Src) 98.3 F (36.8 C) (Oral)  Resp 20  Ht 5\' 11"  (1.803 m)  Wt 311 lb 6.4 oz (141.25 kg)  BMI 43.45 kg/m2  SpO2 95%  General:  alert, cooperative and no distress  Oropharynx: normal findings: lips normal without lesions and gums healthy   Eyes:  negative findings: lids and lashes normal and conjunctivae and sclerae normal   Ears:  external ears normal        Lung: clear to auscultation bilaterally  Heart:  regular rate and rhythm     Extremities: edema bilateral lower extremities  Skin: warm and dry, no hyperpigmentation, vitiligo, or suspicious lesions     Neuro: mental status, speech normal, alert and oriented x3 and gait and station normal   Lab Review GLUCOSE (mg/dL)  Date Value  08/29/2014 293*  06/01/2014 236*  04/24/2014 83   GLUCOSE, BLD (mg/dL)  Date Value  05/19/2014 99  11/16/2011 328*  12/06/2009 232*   CO2 (mmol/L)  Date Value  08/29/2014 26  06/01/2014 25  05/19/2014 30   BUN (mg/dL)  Date Value  08/29/2014 14  06/01/2014 16  05/19/2014 17  04/24/2014 18  11/16/2011 10  12/06/2009 10   CREATININE, SER  (mg/dL)  Date Value  08/29/2014 0.89  06/01/2014 0.97  05/19/2014 1.03       Assessment:    Diabetes Mellitus type II, under good control.   BP at goal <140/90   Plan:    1.  Rx changes: none 2.  Continue Metformin and Jardiance. 3.  Praised exercise efforts.  Has strained a muscle in his back doing butterfly stroke at the pool (was trying to imitate Lidia Collum). 4.  Praised improvements in nutrition. 5.  BP at goal <140/90 6.  Weight is down 7 pounds.

## 2014-10-08 NOTE — Addendum Note (Signed)
Addended by: Marisa Cyphers C on: 10/08/2014 10:42 AM   Modules accepted: Orders, Medications

## 2014-10-08 NOTE — Patient Instructions (Signed)
Keep up the good work

## 2014-10-11 ENCOUNTER — Encounter: Payer: Self-pay | Admitting: Nurse Practitioner

## 2014-10-11 ENCOUNTER — Ambulatory Visit (INDEPENDENT_AMBULATORY_CARE_PROVIDER_SITE_OTHER): Payer: 59 | Admitting: Nurse Practitioner

## 2014-10-11 VITALS — BP 150/82 | HR 65 | Temp 97.9°F | Resp 20 | Ht 71.0 in | Wt 313.2 lb

## 2014-10-11 DIAGNOSIS — R300 Dysuria: Secondary | ICD-10-CM

## 2014-10-11 DIAGNOSIS — E1122 Type 2 diabetes mellitus with diabetic chronic kidney disease: Secondary | ICD-10-CM | POA: Diagnosis not present

## 2014-10-11 DIAGNOSIS — N189 Chronic kidney disease, unspecified: Secondary | ICD-10-CM | POA: Diagnosis not present

## 2014-10-11 LAB — POCT URINALYSIS DIPSTICK
Blood, UA: NEGATIVE
Glucose, UA: 2000
Nitrite, UA: NEGATIVE
Protein, UA: 100
Spec Grav, UA: 1.01
Urobilinogen, UA: 0.2
pH, UA: 6.5

## 2014-10-11 NOTE — Addendum Note (Signed)
Addended by: Marisa Cyphers C on: 10/11/2014 11:59 AM   Modules accepted: Orders

## 2014-10-11 NOTE — Progress Notes (Signed)
Patient ID: Thomas [name], male   DOB: [birth year], 64 y.o.   MRN: [id number]    PCP: [name], NP  No Known Allergies  Chief Complaint  Patient presents with  . Acute Visit    painful when urinating x 1 day     HPI: Patient is a 63 y.o. male seen in the office today due to painful urine starting yesterday. Pt in and out caths himself with all urine, gets a shooting pain into lower back/hip. Worse when he walks and gets up and down.   Took a muscle relaxer and tramadol that he had previously been given that makes it some better.  Started a few days ago and was very painful at first and now has improved significantly. Uses ice with good results.   Blood sugars fasting ranging 113-135. On strict diet, limiting diet eating fruits and vegetables with protein. Limiting carbohydrates and no sweets.  exercing 5 days a week for around 1 hour.   Advanced Directive information Does patient have an advance directive?: No, Would patient like information on creating an advanced directive?: Yes - Educational materials given Review of Systems:  Review of Systems  Constitutional: Negative for activity change, appetite change, fatigue and unexpected weight change.  HENT: Negative for congestion and hearing loss.   Eyes: Negative.   Respiratory: Negative for cough and shortness of breath.   Cardiovascular: Negative for chest pain, palpitations and leg swelling.  Gastrointestinal: Negative for abdominal pain, diarrhea and constipation.  Genitourinary: Positive for dysuria (with i&o cath). Negative for frequency.  Musculoskeletal: Positive for myalgias and arthralgias.       To left lower back  Skin: Negative for color change, rash and wound.  Neurological: Negative for dizziness.    Past Medical History  Diagnosis Date  . Diabetes mellitus   . Hypertension   . Arthritis   . Chronic kidney disease   . Cancer 2010    Prostate   Past Surgical History  Procedure Laterality  Date  . Knee surgery    . Shoulder surgery    . Hernia repair    . Prostate surgery    . Uretha surgery-2014    . Radioactive seed implant     Social History:   reports that he has never smoked. He has never used smokeless tobacco. He reports that he does not drink alcohol or use illicit drugs.  History reviewed. No pertinent family history.  Medications: Patient's Medications  New Prescriptions   No medications on file  Previous Medications   AMLODIPINE (NORVASC) 10 MG TABLET    Take 1 tablet (10 mg total) by mouth daily.   ASPIRIN 81 MG TABLET    Take 81 mg by mouth daily.   ATORVASTATIN (LIPITOR) 10 MG TABLET    Take one tablet by mouth once daily for cholesterol   CARVEDILOL (COREG) 6.25 MG TABLET    TAKE 1 TABLET BY MOUTH 2 TIMES DAILY WITH A MEAL.   EMPAGLIFLOZIN (JARDIANCE) 10 MG TABS TABLET    Take 10 mg by mouth daily.   GLUCOSE BLOOD (TRUE METRIX BLOOD GLUCOSE TEST) TEST STRIP    Use to check blood sugar twice a day, (DX code  E11.42)   HYDRALAZINE (APRESOLINE) 10 MG TABLET    Take 1 tablet (10 mg total) by mouth 3 (three) times daily.   HYDROCHLOROTHIAZIDE (HYDRODIURIL) 25 MG TABLET    Take 1 tablet (25 mg total) by mouth daily.   IRON POLYSACCHARIDES (NIFEREX) 150 MG  CAPSULE    Take 1 capsule (150 mg total) by mouth 2 (two) times daily.   LISINOPRIL (PRINIVIL,ZESTRIL) 40 MG TABLET    Take 1 tablet (40 mg total) by mouth daily.   METFORMIN (GLUCOPHAGE) 500 MG TABLET    Take 500 mg by mouth 2 (two) times daily with a meal.   OMEPRAZOLE (PRILOSEC) 20 MG CAPSULE    Take 1 capsule (20 mg total) by mouth as needed.   POTASSIUM CHLORIDE SA (K-DUR,KLOR-CON) 20 MEQ TABLET    1 by mouth twice daily x 3 days, then 1 by mouth daily   TIZANIDINE (ZANAFLEX) 4 MG CAPSULE    Take 1 capsule (4 mg total) by mouth 3 (three) times daily as needed for muscle spasms.   TRAMADOL (ULTRAM) 50 MG TABLET    Take 1 tablet (50 mg total) by mouth every 8 (eight) hours as needed (pain).   TRUEPLUS  LANCETS 30G MISC    USE TO TEST BLOOD SUGAR 2 TIMES A DAY   VITAMIN B-12 (CYANOCOBALAMIN) 1000 MCG TABLET    Take 1,000 mcg by mouth daily.  Modified Medications   No medications on file  Discontinued Medications   No medications on file     Physical Exam:  Filed Vitals:   10/11/14 1042  BP: 150/82  Pulse: 65  Temp: 97.9 F (36.6 C)  TempSrc: Oral  Resp: 20  Height: 5\' 11"  (1.803 m)  Weight: 313 lb 3.2 oz (142.067 kg)  SpO2: 93%    Physical Exam  Constitutional: He is oriented to person, place, and time. He appears well-developed and well-nourished. No distress.  HENT:  Head: Normocephalic and atraumatic.  Neck: Normal range of motion.  Cardiovascular: Normal rate, regular rhythm and normal heart sounds.   Pulmonary/Chest: Effort normal and breath sounds normal.  Musculoskeletal: He exhibits no edema or tenderness.  No CVA tenderness or tenderness to lower back area  Neurological: He is alert and oriented to person, place, and time.  Skin: Skin is warm and dry. He is not diaphoretic.  Psychiatric: He has a normal mood and affect.    Labs reviewed: Basic Metabolic Panel:  Recent Labs  05/19/14 1335 06/01/14 0951 08/29/14 0823  NA 140 144 143  K 2.9* 3.9 3.9  CL 101 101 99  CO2 30 25 26   GLUCOSE 99 236* 293*  BUN 17 16 14   CREATININE 1.03 0.97 0.89  CALCIUM 9.0 8.8 8.7   Liver Function Tests:  Recent Labs  04/24/14 1617 08/29/14 0823  AST 13 13  ALT 15 8  ALKPHOS 64 82  BILITOT 0.5 0.5  PROT 7.3 6.9   No results for input(s): LIPASE, AMYLASE in the last 8760 hours. No results for input(s): AMMONIA in the last 8760 hours. CBC:  Recent Labs  04/24/14 1617 05/19/14 1335  WBC 11.3* 8.3  NEUTROABS 8.0* 5.6  HGB 12.8 12.9*  HCT 38.4 39.8  MCV 81 82.2  PLT 286 307   Lipid Panel:  Recent Labs  11/17/13 0826 04/24/14 1617 08/29/14 0823  CHOL 148 150 165  HDL 44 49 53  LDLCALC 84 83 96  TRIG 101 91 82  CHOLHDL 3.4 3.1 3.1   TSH: No  results for input(s): TSH in the last 8760 hours. A1C: Lab Results  Component Value Date   HGBA1C 8.1* 08/29/2014     Assessment/Plan 1. Dysuria -when he is I&O cath himself with increased pain.  -UA reveals leukocytes therefore will send for Culture, Urine -to increase  hydration  2. Type 2 diabetes mellitus with diabetic chronic kidney disease -greater than 2000 in urine -A1c in July of 8.1, following with [facility] -cont current medication regimen, cont exercise and diet modifications.  -cbg in office was 99 - Basic metabolic panel follow up BUN/CR  3. Muscle strain -cont to use ice 2-3 times daily for next 5 days -may use tramadol and zanaflex as needed -good stretching when exercising   Return precautions discussed, keep follow up  [place]. Harle Battiest  [facility] [phone] 8 am - 5 pm) [phone] (after hours)

## 2014-10-11 NOTE — Patient Instructions (Signed)
Muscle strain Cont tramadol as needed Use ice 2-3 times daily for next 5 days May use zanaflex as needed for muscle spasm  Will send urine off for culture  Cont working on blood sugar- low carb diet  Keep follow up appts

## 2014-10-12 LAB — BASIC METABOLIC PANEL
BUN/Creatinine Ratio: 15 (ref 10–22)
BUN: 17 mg/dL (ref 8–27)
CO2: 25 mmol/L (ref 18–29)
Calcium: 9.1 mg/dL (ref 8.6–10.2)
Chloride: 102 mmol/L (ref 97–108)
Creatinine, Ser: 1.11 mg/dL (ref 0.76–1.27)
GFR calc Af Amer: 81 mL/min/{1.73_m2} (ref >59)
GFR calc non Af Amer: 70 mL/min/{1.73_m2} (ref >59)
Glucose: 102 mg/dL — ABNORMAL HIGH (ref 65–99)
Potassium: 4.4 mmol/L (ref 3.5–5.2)
Sodium: 142 mmol/L (ref 134–144)

## 2014-10-12 LAB — URINE CULTURE: Organism ID, Bacteria: NO GROWTH

## 2014-11-08 DIAGNOSIS — C61 Malignant neoplasm of prostate: Secondary | ICD-10-CM | POA: Diagnosis not present

## 2014-11-08 DIAGNOSIS — N390 Urinary tract infection, site not specified: Secondary | ICD-10-CM | POA: Diagnosis not present

## 2014-11-08 DIAGNOSIS — R339 Retention of urine, unspecified: Secondary | ICD-10-CM | POA: Diagnosis not present

## 2014-12-03 ENCOUNTER — Other Ambulatory Visit: Payer: Self-pay | Admitting: Physical Medicine & Rehabilitation

## 2014-12-03 ENCOUNTER — Encounter: Payer: Self-pay | Admitting: Pharmacotherapy

## 2014-12-03 ENCOUNTER — Ambulatory Visit (INDEPENDENT_AMBULATORY_CARE_PROVIDER_SITE_OTHER): Payer: 59 | Admitting: Pharmacotherapy

## 2014-12-03 VITALS — BP 142/80 | HR 69 | Temp 97.4°F | Resp 20 | Ht 71.0 in | Wt 306.4 lb

## 2014-12-03 DIAGNOSIS — E1142 Type 2 diabetes mellitus with diabetic polyneuropathy: Secondary | ICD-10-CM

## 2014-12-03 DIAGNOSIS — I10 Essential (primary) hypertension: Secondary | ICD-10-CM

## 2014-12-03 DIAGNOSIS — E1122 Type 2 diabetes mellitus with diabetic chronic kidney disease: Secondary | ICD-10-CM

## 2014-12-03 NOTE — Progress Notes (Signed)
  Subjective:    Thomas [name] is a 64 y.o.African American male who presents for follow-up of Type 2 diabetes mellitus.   His weight is down 7 more pounds. Not as much exercise - knee pain.  Average BG:  120mg /dl No hypoglycemia  Making healthy food choices. Denies dysuria.  Did have 1 UTI since last OV Nocturia 3 times per night - improvement. Drinking a lot of water. He has numbness in toes.  No sores. Denies problems with vision.  Wears corrective lenses. Denies peripheral edema.  Review of Systems A comprehensive review of systems was negative except for: Eyes: positive for contacts/glasses Genitourinary: positive for nocturia Musculoskeletal: positive for arthralgias and stiff joints    Objective:    BP 142/80 mmHg  Pulse 69  Temp(Src) 97.4 F (36.3 C) (Oral)  Resp 20  Ht 5\' 11"  (1.803 m)  Wt 306 lb 6.4 oz (138.982 kg)  BMI 42.75 kg/m2  SpO2 96%  General:  alert, cooperative and no distress  Oropharynx: normal findings: lips normal without lesions and gums healthy   Eyes:  negative findings: lids and lashes normal and conjunctivae and sclerae normal   Ears:  external ears normal        Lung: clear to auscultation bilaterally  Heart:  regular rate and rhythm     Extremities: extremities normal, atraumatic, no cyanosis or edema  Skin: warm and dry, no hyperpigmentation, vitiligo, or suspicious lesions     Neuro: mental status, speech normal, alert and oriented x3 and gait and station normal   Lab Review GLUCOSE (mg/dL)  Date Value  10/11/2014 102*  08/29/2014 293*  06/01/2014 236*   GLUCOSE, BLD (mg/dL)  Date Value  05/19/2014 99  11/16/2011 328*  12/06/2009 232*   CO2 (mmol/L)  Date Value  10/11/2014 25  08/29/2014 26  06/01/2014 25   BUN (mg/dL)  Date Value  10/11/2014 17  08/29/2014 14  06/01/2014 16  05/19/2014 17  11/16/2011 10  12/06/2009 10   CREATININE, SER (mg/dL)  Date Value  10/11/2014 1.11  08/29/2014 0.89   06/01/2014 0.97       Assessment:    Diabetes Mellitus type II, under good control.    Plan:    1.  Rx changes: none 2.  Continue Metformin 3.  Continue Jardiance 4.  Counseled on nutrition goals. 5.  Exercise goal is 30-45 minutes 5 x week 6.  Counseled on foot care. 7.  BP slightly above target <140/90.  Will continue current RX and monitorl

## 2014-12-04 LAB — COMPREHENSIVE METABOLIC PANEL
ALT: 11 IU/L (ref 0–44)
AST: 12 IU/L (ref 0–40)
Albumin/Globulin Ratio: 1.2 (ref 1.1–2.5)
Albumin: 3.9 g/dL (ref 3.6–4.8)
Alkaline Phosphatase: 69 IU/L (ref 39–117)
BUN/Creatinine Ratio: 15 (ref 10–22)
BUN: 17 mg/dL (ref 8–27)
Bilirubin Total: 0.5 mg/dL (ref 0.0–1.2)
CO2: 27 mmol/L (ref 18–29)
Calcium: 8.9 mg/dL (ref 8.6–10.2)
Chloride: 103 mmol/L (ref 97–106)
Creatinine, Ser: 1.11 mg/dL (ref 0.76–1.27)
GFR calc Af Amer: 81 mL/min/{1.73_m2} (ref >59)
GFR calc non Af Amer: 70 mL/min/{1.73_m2} (ref >59)
Globulin, Total: 3.2 g/dL (ref 1.5–4.5)
Glucose: 160 mg/dL — ABNORMAL HIGH (ref 65–99)
Potassium: 4.2 mmol/L (ref 3.5–5.2)
Sodium: 144 mmol/L (ref 136–144)
Total Protein: 7.1 g/dL (ref 6.0–8.5)

## 2014-12-04 LAB — HEMOGLOBIN A1C
Est. average glucose Bld gHb Est-mCnc: 137 mg/dL
Hgb A1c MFr Bld: 6.4 % — ABNORMAL HIGH (ref 4.8–5.6)

## 2014-12-05 ENCOUNTER — Encounter: Payer: Self-pay | Admitting: Internal Medicine

## 2014-12-05 ENCOUNTER — Ambulatory Visit (INDEPENDENT_AMBULATORY_CARE_PROVIDER_SITE_OTHER): Payer: 59 | Admitting: Internal Medicine

## 2014-12-05 VITALS — BP 126/76 | HR 71 | Temp 98.1°F | Resp 20 | Ht 71.0 in | Wt 305.6 lb

## 2014-12-05 DIAGNOSIS — E1122 Type 2 diabetes mellitus with diabetic chronic kidney disease: Secondary | ICD-10-CM | POA: Diagnosis not present

## 2014-12-05 DIAGNOSIS — D509 Iron deficiency anemia, unspecified: Secondary | ICD-10-CM

## 2014-12-05 DIAGNOSIS — E785 Hyperlipidemia, unspecified: Secondary | ICD-10-CM | POA: Diagnosis not present

## 2014-12-05 DIAGNOSIS — Z23 Encounter for immunization: Secondary | ICD-10-CM

## 2014-12-05 DIAGNOSIS — E538 Deficiency of other specified B group vitamins: Secondary | ICD-10-CM | POA: Diagnosis not present

## 2014-12-05 DIAGNOSIS — I10 Essential (primary) hypertension: Secondary | ICD-10-CM | POA: Diagnosis not present

## 2014-12-05 DIAGNOSIS — M170 Bilateral primary osteoarthritis of knee: Secondary | ICD-10-CM | POA: Diagnosis not present

## 2014-12-05 MED ORDER — TRAMADOL HCL 50 MG PO TABS: 50.0000 mg | ORAL_TABLET | Freq: Three times a day (TID) | ORAL | Status: DC | PRN

## 2014-12-05 NOTE — Progress Notes (Signed)
Patient ID: Thomas [name], male   DOB: Aug 09, 1950, 64 y.o.   MRN: [id number]    Location:    [facility]   Place of Service:   OFFICE  Chief Complaint  Patient presents with  . Medical Management of Chronic Issues    4 month follow-up DM,    HPI:  64 yo male seen today for f/u. He c/o 1.5 month hx right nasal soreness with TTP bump. Tried aquaphor ointment on it and it has helped external soreness but bump still present. No epistaxis  DM - BS stable at home 100-130s. Stopped diluting juice at home. No low BS reactions. He is taking metformin and jardiance   HTN - BP stable on norvasc, HCTZ, coreg, lisinopril and hydralazine  CKD - Cr stable.   Hx prostate CA - stable. Followed by urology  Arthritis - pain controlled on zanaflex, tramadol. He only takes tramadol prn and has not seen pain clinic in 6 mos  Hx anemia (iron/B12) - takes B12 and iron supplements  Hyperlipidemia - stable on lipitor. No myalgias  Past Medical History  Diagnosis Date  . Diabetes mellitus   . Hypertension   . Arthritis   . Chronic kidney disease   . Cancer [facility] 2010    Prostate    Past Surgical History  Procedure Laterality Date  . Knee surgery    . Shoulder surgery    . Hernia repair    . Prostate surgery    . Uretha surgery-2014    . Radioactive seed implant      Patient Care Team: [name], NP as PCP - General (Nurse Practitioner)  Social History   Social History  . Marital Status: Married    Spouse Name: N/A  . Number of Children: N/A  . Years of Education: N/A   Occupational History  . Not on file.   Social History Main Topics  . Smoking status: Never Smoker   . Smokeless tobacco: Never Used  . Alcohol Use: No     Comment: never  . Drug Use: No  . Sexual Activity: Not on file   Other Topics Concern  . Not on file   Social History Narrative     reports that he has never smoked. He has never used smokeless tobacco. He reports that he does not drink  alcohol or use illicit drugs.  No Known Allergies  Medications: Patient's Medications  New Prescriptions   No medications on file  Previous Medications   AMLODIPINE (NORVASC) 10 MG TABLET    Take 1 tablet (10 mg total) by mouth daily.   ASPIRIN 81 MG TABLET    Take 81 mg by mouth daily.   ATORVASTATIN (LIPITOR) 10 MG TABLET    Take one tablet by mouth once daily for cholesterol   CARVEDILOL (COREG) 6.25 MG TABLET    TAKE 1 TABLET BY MOUTH 2 TIMES DAILY WITH A MEAL.   EMPAGLIFLOZIN (JARDIANCE) 10 MG TABS TABLET    Take 10 mg by mouth daily.   GLUCOSE BLOOD (TRUE METRIX BLOOD GLUCOSE TEST) TEST STRIP    Use to check blood sugar twice a day, (DX code  E11.42)   HYDRALAZINE (APRESOLINE) 10 MG TABLET    Take 1 tablet (10 mg total) by mouth 3 (three) times daily.   HYDROCHLOROTHIAZIDE (HYDRODIURIL) 25 MG TABLET    Take 1 tablet (25 mg total) by mouth daily.   IRON POLYSACCHARIDES (NIFEREX) 150 MG CAPSULE    Take 1 capsule (150  mg total) by mouth 2 (two) times daily.   LISINOPRIL (PRINIVIL,ZESTRIL) 40 MG TABLET    Take 1 tablet (40 mg total) by mouth daily.   METFORMIN (GLUCOPHAGE) 500 MG TABLET    Take 500 mg by mouth 2 (two) times daily with a meal.   OMEPRAZOLE (PRILOSEC) 20 MG CAPSULE    Take 1 capsule (20 mg total) by mouth as needed.   POTASSIUM CHLORIDE SA (K-DUR,KLOR-CON) 20 MEQ TABLET    1 by mouth twice daily x 3 days, then 1 by mouth daily   TIZANIDINE (ZANAFLEX) 4 MG CAPSULE    Take 1 capsule (4 mg total) by mouth 3 (three) times daily as needed for muscle spasms.   TRAMADOL (ULTRAM) 50 MG TABLET    Take 1 tablet (50 mg total) by mouth every 8 (eight) hours as needed (pain).   TRUEPLUS LANCETS 30G MISC    USE TO TEST BLOOD SUGAR 2 TIMES A DAY   VITAMIN B-12 (CYANOCOBALAMIN) 1000 MCG TABLET    Take 1,000 mcg by mouth daily.  Modified Medications   No medications on file  Discontinued Medications   No medications on file    Review of Systems  Constitutional: Negative for chills,  activity change and fatigue.  HENT: Negative for sore throat and trouble swallowing.   Eyes: Negative for visual disturbance.  Respiratory: Negative for cough, chest tightness and shortness of breath.   Cardiovascular: Negative for chest pain, palpitations and leg swelling.  Gastrointestinal: Negative for nausea, vomiting, abdominal pain and blood in stool.  Genitourinary: Negative for urgency, frequency and difficulty urinating.  Musculoskeletal: Positive for joint swelling, arthralgias and gait problem.  Skin: Negative for rash.  Neurological: Negative for weakness and headaches.  Psychiatric/Behavioral: Negative for confusion and sleep disturbance. The patient is not nervous/anxious.     Filed Vitals:   12/05/14 1111  BP: 126/76  Pulse: 71  Temp: 98.1 F (36.7 C)  TempSrc: Oral  Resp: 20  Height: 5\' 11"  (1.803 m)  Weight: 305 lb 9.6 oz (138.619 kg)  SpO2: 96%   Body mass index is 42.64 kg/(m^2).  Physical Exam  Constitutional: He is oriented to person, place, and time. He appears well-developed and well-nourished.  HENT:  Mouth/Throat: Oropharynx is clear and moist.  Eyes: Pupils are equal, round, and reactive to light. No scleral icterus.  Neck: Neck supple. Carotid bruit is not present. No thyromegaly present.  Cardiovascular: Normal rate, regular rhythm, normal heart sounds and intact distal pulses.  Exam reveals no gallop and no friction rub.   No murmur heard. no distal LE swelling. No calf TTP  Pulmonary/Chest: Effort normal and breath sounds normal. He has no wheezes. He has no rales. He exhibits no tenderness.  Abdominal: Soft. Bowel sounds are normal. He exhibits no distension, no abdominal bruit, no pulsatile midline mass and no mass. There is no tenderness. There is no rebound and no guarding.  Musculoskeletal: He exhibits edema (b/l knee) and tenderness.  Lymphadenopathy:    He has no cervical adenopathy.  Neurological: He is alert and oriented to person,  place, and time. He has normal reflexes.  Skin: Skin is warm and dry. No rash noted.  Psychiatric: He has a normal mood and affect. His behavior is normal. Judgment and thought content normal.     Labs reviewed: Office Visit on 12/03/2014  Component Date Value Ref Range Status  . Hgb A1c MFr Bld 12/03/2014 6.4* 4.8 - 5.6 % Final   Comment:  Pre-diabetes: 5.7 - 6.4          Diabetes: >6.4          Glycemic control for adults with diabetes: <7.0   . Est. average glucose Bld gHb Est-m* 12/03/2014 137   Final  . Glucose 12/03/2014 160* 65 - 99 mg/dL Final  . BUN 12/03/2014 17  8 - 27 mg/dL Final  . Creatinine, Ser 12/03/2014 1.11  0.76 - 1.27 mg/dL Final  . GFR calc non Af Amer 12/03/2014 70  >59 mL/min/1.73 Final  . GFR calc Af Amer 12/03/2014 81  >59 mL/min/1.73 Final  . BUN/Creatinine Ratio 12/03/2014 15  10 - 22 Final  . Sodium 12/03/2014 144  136 - 144 mmol/L Final                 **Please note reference interval change**  . Potassium 12/03/2014 4.2  3.5 - 5.2 mmol/L Final                 **Please note reference interval change**  . Chloride 12/03/2014 103  97 - 106 mmol/L Final                 **Please note reference interval change**  . CO2 12/03/2014 27  18 - 29 mmol/L Final  . Calcium 12/03/2014 8.9  8.6 - 10.2 mg/dL Final  . Total Protein 12/03/2014 7.1  6.0 - 8.5 g/dL Final  . Albumin 12/03/2014 3.9  3.6 - 4.8 g/dL Final  . Globulin, Total 12/03/2014 3.2  1.5 - 4.5 g/dL Final  . Albumin/Globulin Ratio 12/03/2014 1.2  1.1 - 2.5 Final  . Bilirubin Total 12/03/2014 0.5  0.0 - 1.2 mg/dL Final  . Alkaline Phosphatase 12/03/2014 69  39 - 117 IU/L Final  . AST 12/03/2014 12  0 - 40 IU/L Final  . ALT 12/03/2014 11  0 - 44 IU/L Final  Office Visit on 10/11/2014  Component Date Value Ref Range Status  . Urine Culture, Routine 10/11/2014 Final report   Final  . Urine Culture result 1 10/11/2014 No growth   Final  . Glucose 10/11/2014 102* 65 - 99 mg/dL Final  . BUN  10/11/2014 17  8 - 27 mg/dL Final  . Creatinine, Ser 10/11/2014 1.11  0.76 - 1.27 mg/dL Final  . GFR calc non Af Amer 10/11/2014 70  >59 mL/min/1.73 Final  . GFR calc Af Amer 10/11/2014 81  >59 mL/min/1.73 Final  . BUN/Creatinine Ratio 10/11/2014 15  10 - 22 Final  . Sodium 10/11/2014 142  134 - 144 mmol/L Final  . Potassium 10/11/2014 4.4  3.5 - 5.2 mmol/L Final  . Chloride 10/11/2014 102  97 - 108 mmol/L Final  . CO2 10/11/2014 25  18 - 29 mmol/L Final  . Calcium 10/11/2014 9.1  8.6 - 10.2 mg/dL Final  . Color, UA 10/11/2014 yellow   Final  . Clarity, UA 10/11/2014 clear   Final  . Glucose, UA 10/11/2014 2000 or more   Final  . Bilirubin, UA 10/11/2014 small   Final  . Ketones, UA 10/11/2014 small   Final  . Spec Grav, UA 10/11/2014 1.010   Final  . Blood, UA 10/11/2014 Negative   Final  . pH, UA 10/11/2014 6.5   Final  . Protein, UA 10/11/2014 100   Final  . Urobilinogen, UA 10/11/2014 0.2   Final  . Nitrite, UA 10/11/2014 Negative   Final  . Leukocytes, UA 10/11/2014 small (1+)* Negative Final    No results  found.   Assessment/Plan   ICD-9-CM ICD-10-CM   1. Primary osteoarthritis of both knees - pain controlled 715.16 M17.0   2. Type 2 diabetes mellitus with chronic kidney disease, without long-term current use of insulin, unspecified CKD stage (HCC) - stable 250.40 B28.41 Basic Metabolic Panel   324.4  Hemoglobin A1c     ALT     Microalbumin/Creatinine Ratio, Urine  3. Essential hypertension - stable 401.9 W10 Basic Metabolic Panel  4. Hyperlipidemia with target LDL less than 100 - stable 272.4 E78.5 Lipid Panel  5. Anemia, iron deficiency - stable 280.9 D50.9 CBC with Differential     Ferritin     Iron  6. Need for prophylactic vaccination and inoculation against influenza V04.81 Z23 Flu Vaccine QUAD 36+ mos PF IM (Fluarix & Fluzone Quad PF)  7. B12 deficiency - stable 266.2 E53.8 Vitamin B12    flu shot given today  Continue current medications as ordered  Return  to lab for urine test  F/u with diabetic educator as scheduled  Follow up in 4 mos for routine visit. Fasting labs 2-3 days prior  [place] S. Perlie Gold  [facility] [address] [place], [place] [address] [phone] Cell (Monday-Friday 8 AM - 5 PM) [phone] After 5 PM and follow prompts

## 2014-12-05 NOTE — Patient Instructions (Signed)
Flu shot given today  Continue current medications as ordered  Return to lab for urine test  Follow up in 4 mos for routine visit. Fasting labs 2-3 days prior

## 2014-12-06 LAB — MICROALBUMIN / CREATININE URINE RATIO
Creatinine, Urine: 163.1 mg/dL
MICROALB/CREAT RATIO: 147 mg/g creat — ABNORMAL HIGH (ref 0.0–30.0)
Microalbumin, Urine: 239.7 ug/mL

## 2014-12-11 ENCOUNTER — Other Ambulatory Visit: Payer: Self-pay | Admitting: *Deleted

## 2015-01-09 ENCOUNTER — Telehealth: Payer: Self-pay

## 2015-01-09 ENCOUNTER — Other Ambulatory Visit: Payer: Self-pay

## 2015-01-09 MED ORDER — LISINOPRIL 40 MG PO TABS: 40.0000 mg | ORAL_TABLET | Freq: Every day | ORAL | Status: DC

## 2015-01-09 NOTE — Telephone Encounter (Signed)
Patient called for a refill on his lisinopril it  was due script sent

## 2015-01-22 ENCOUNTER — Other Ambulatory Visit: Payer: Self-pay | Admitting: Nurse Practitioner

## 2015-01-22 ENCOUNTER — Telehealth: Payer: Self-pay | Admitting: *Deleted

## 2015-01-22 NOTE — Telephone Encounter (Signed)
Patient called and stated that he misplaced the Rx you gave him in October for the Tramadol and he is calling requesting another one to be written. He stated he knows he has to go to the pain clinic but he needs the misplaced Rx written again. I asked patient why he was just calling if he misplaced the October Rx and he stated that he has been taking the muscle relaxers instead.  Please Advise.

## 2015-01-23 NOTE — Telephone Encounter (Signed)
Ok to RF med x 1. No further RF after this one. Needs to f/u with pain clinic

## 2015-01-24 MED ORDER — TRAMADOL HCL 50 MG PO TABS: 50.0000 mg | ORAL_TABLET | Freq: Three times a day (TID) | ORAL | Status: DC | PRN

## 2015-01-24 NOTE — Telephone Encounter (Signed)
Patient Notified and agreed. Rx printed for pick up

## 2015-02-18 MED FILL — hydrALAZINE HCL 10 MG TABS: 10 | 30 days supply | Qty: 90 | Fill #1

## 2015-02-18 MED FILL — metFORMIN HCL 500 MG TABS: 500 | 30 days supply | Qty: 60 | Fill #3

## 2015-03-05 ENCOUNTER — Other Ambulatory Visit: Payer: Self-pay | Admitting: Nurse Practitioner

## 2015-03-05 MED FILL — CARVEDILOL 6.25 MG TABLET: 6.25 | 90 days supply | Qty: 180 | Fill #0

## 2015-03-08 ENCOUNTER — Other Ambulatory Visit: Payer: 59

## 2015-03-08 DIAGNOSIS — D509 Iron deficiency anemia, unspecified: Secondary | ICD-10-CM | POA: Diagnosis not present

## 2015-03-08 DIAGNOSIS — E1122 Type 2 diabetes mellitus with diabetic chronic kidney disease: Secondary | ICD-10-CM

## 2015-03-08 DIAGNOSIS — E538 Deficiency of other specified B group vitamins: Secondary | ICD-10-CM | POA: Diagnosis not present

## 2015-03-08 DIAGNOSIS — E785 Hyperlipidemia, unspecified: Secondary | ICD-10-CM | POA: Diagnosis not present

## 2015-03-09 LAB — CBC WITH DIFFERENTIAL/PLATELET
Basophils Absolute: 0 10*3/uL (ref 0.0–0.2)
Basos: 0 %
EOS (ABSOLUTE): 0.1 10*3/uL (ref 0.0–0.4)
Eos: 1 %
Hematocrit: 38.7 % (ref 37.5–51.0)
Hemoglobin: 12.7 g/dL (ref 12.6–17.7)
Immature Grans (Abs): 0 10*3/uL (ref 0.0–0.1)
Immature Granulocytes: 0 %
Lymphocytes Absolute: 2.2 10*3/uL (ref 0.7–3.1)
Lymphs: 30 %
MCH: 27.1 pg (ref 26.6–33.0)
MCHC: 32.8 g/dL (ref 31.5–35.7)
MCV: 83 fL (ref 79–97)
Monocytes Absolute: 0.4 10*3/uL (ref 0.1–0.9)
Monocytes: 5 %
Neutrophils Absolute: 4.7 10*3/uL (ref 1.4–7.0)
Neutrophils: 64 %
Platelets: 287 10*3/uL (ref 150–379)
RBC: 4.69 x10E6/uL (ref 4.14–5.80)
RDW: 13.7 % (ref 12.3–15.4)
WBC: 7.4 10*3/uL (ref 3.4–10.8)

## 2015-03-09 LAB — COMPREHENSIVE METABOLIC PANEL
ALT: 9 IU/L (ref 0–44)
AST: 13 IU/L (ref 0–40)
Albumin/Globulin Ratio: 1.3 (ref 1.1–2.5)
Albumin: 3.9 g/dL (ref 3.6–4.8)
Alkaline Phosphatase: 82 IU/L (ref 39–117)
BUN/Creatinine Ratio: 17 (ref 10–22)
BUN: 22 mg/dL (ref 8–27)
Bilirubin Total: 0.5 mg/dL (ref 0.0–1.2)
CO2: 29 mmol/L (ref 18–29)
Calcium: 8.5 mg/dL — ABNORMAL LOW (ref 8.6–10.2)
Chloride: 99 mmol/L (ref 96–106)
Creatinine, Ser: 1.28 mg/dL — ABNORMAL HIGH (ref 0.76–1.27)
GFR calc Af Amer: 68 mL/min/{1.73_m2} (ref >59)
GFR calc non Af Amer: 59 mL/min/{1.73_m2} — ABNORMAL LOW (ref >59)
Globulin, Total: 3 g/dL (ref 1.5–4.5)
Glucose: 178 mg/dL — ABNORMAL HIGH (ref 65–99)
Potassium: 3.5 mmol/L (ref 3.5–5.2)
Sodium: 143 mmol/L (ref 134–144)
Total Protein: 6.9 g/dL (ref 6.0–8.5)

## 2015-03-09 LAB — LIPID PANEL
Chol/HDL Ratio: 2.9 ratio units (ref 0.0–5.0)
Cholesterol, Total: 153 mg/dL (ref 100–199)
HDL: 52 mg/dL (ref >39)
LDL Calculated: 87 mg/dL (ref 0–99)
Triglycerides: 70 mg/dL (ref 0–149)
VLDL Cholesterol Cal: 14 mg/dL (ref 5–40)

## 2015-03-09 LAB — VITAMIN B12: Vitamin B-12: 415 pg/mL (ref 211–946)

## 2015-03-09 LAB — FERRITIN: Ferritin: 194 ng/mL (ref 30–400)

## 2015-03-09 LAB — HEMOGLOBIN A1C
Est. average glucose Bld gHb Est-mCnc: 148 mg/dL
Hgb A1c MFr Bld: 6.8 % — ABNORMAL HIGH (ref 4.8–5.6)

## 2015-03-09 LAB — ALT: ALT: 10 IU/L (ref 0–44)

## 2015-03-09 LAB — IRON: Iron: 42 ug/dL (ref 38–169)

## 2015-03-11 ENCOUNTER — Ambulatory Visit (INDEPENDENT_AMBULATORY_CARE_PROVIDER_SITE_OTHER): Payer: 59 | Admitting: Pharmacotherapy

## 2015-03-11 ENCOUNTER — Encounter: Payer: Self-pay | Admitting: Pharmacotherapy

## 2015-03-11 VITALS — BP 144/90 | HR 70 | Temp 98.2°F | Ht 71.0 in | Wt 307.2 lb

## 2015-03-11 DIAGNOSIS — I10 Essential (primary) hypertension: Secondary | ICD-10-CM | POA: Diagnosis not present

## 2015-03-11 DIAGNOSIS — E1122 Type 2 diabetes mellitus with diabetic chronic kidney disease: Secondary | ICD-10-CM

## 2015-03-11 NOTE — Patient Instructions (Signed)
Continue metformin and Jardiance. Ask for help if  You need it.

## 2015-03-11 NOTE — Progress Notes (Signed)
  Subjective:    Thomas [name] is a 65 y.o.African American male who presents for follow-up of Type 2 diabetes mellitus.   A1C 6.8% Estimated GFR: 34m/min  Has been going through a stressful 2 months - wife now on dialysis. Not checking his BG very often  Not making healthy food choices.   No routine exercise.  Has a floater in right eye. Needs eye exam. Nocturia 4-5 times per night. Staying well hydrated. Denies dysuria. Denies problems with feet. Denies peripheral edema.    Review of Systems A comprehensive review of systems was negative except for: Eyes: positive for visual disturbance Genitourinary: positive for nocturia Behavioral/Psych: positive for under a lot of emotional stress    Objective:    BP 144/90 mmHg  Pulse 70  Temp(Src) 98.2 F (36.8 C) (Oral)  Ht 5' 11"$  (1.803 m)  Wt 307 lb 3.2 oz (139.345 kg)  BMI 42.86 kg/m2  SpO2 93%  General:  alert, cooperative and no distress  Oropharynx: normal findings: lips normal without lesions and gums healthy   Eyes:  negative findings: lids and lashes normal and conjunctivae and sclerae normal   Ears:  external ears normal        Lung: clear to auscultation bilaterally  Heart:  regular rate and rhythm     Extremities: extremities normal, atraumatic, no cyanosis or edema  Skin: warm and dry, no hyperpigmentation, vitiligo, or suspicious lesions     Neuro: mental status, speech normal, alert and oriented x3 and gait and station normal   Lab Review GLUCOSE (mg/dL)  Date Value  03/08/2015 178*  12/03/2014 160*  10/11/2014 102*   GLUCOSE, BLD (mg/dL)  Date Value  05/19/2014 99  11/16/2011 328*  12/06/2009 232*   CO2 (mmol/L)  Date Value  03/08/2015 29  12/03/2014 27  10/11/2014 25   BUN (mg/dL)  Date Value  03/08/2015 22  12/03/2014 17  10/11/2014 17  05/19/2014 17  11/16/2011 10  12/06/2009 10   CREATININE, SER (mg/dL)  Date Value  03/08/2015 1.28*  12/03/2014 1.11  10/11/2014 1.11        Assessment:    Diabetes Mellitus type II, under good control.   BP above goal <140/90  Plan:    1.  Rx changes: none 2.  Continue metformin and Jardiance. 3.  Counseled on stress mgmt.  He does have family support. 4.  BP above target <140/90.  Has been under a lot of stress.  Continue 4 drug regimen and monitor. 5.  Exercise goal is 30-45 minutes 5 x week.

## 2015-03-22 DIAGNOSIS — E119 Type 2 diabetes mellitus without complications: Secondary | ICD-10-CM | POA: Diagnosis not present

## 2015-03-22 DIAGNOSIS — H40053 Ocular hypertension, bilateral: Secondary | ICD-10-CM | POA: Diagnosis not present

## 2015-03-22 DIAGNOSIS — H40013 Open angle with borderline findings, low risk, bilateral: Secondary | ICD-10-CM | POA: Diagnosis not present

## 2015-03-28 ENCOUNTER — Other Ambulatory Visit: Payer: Self-pay | Admitting: Internal Medicine

## 2015-03-28 ENCOUNTER — Other Ambulatory Visit: Payer: Self-pay | Admitting: Pharmacotherapy

## 2015-03-28 MED FILL — POTASSIUM CL ER 20 MEQ TAB: 20 | 30 days supply | Qty: 33 | Fill #3

## 2015-03-28 MED FILL — POLY-IRON 150 MG CAPSULE: 150 | 30 days supply | Qty: 60 | Fill #0

## 2015-03-28 MED FILL — JARDIANCE 10 MG TABLET: 10 | 30 days supply | Qty: 30 | Fill #0

## 2015-03-28 MED FILL — metFORMIN HCL 500 MG TABS: 500 | 30 days supply | Qty: 60 | Fill #4

## 2015-04-08 ENCOUNTER — Other Ambulatory Visit: Payer: 59

## 2015-04-08 DIAGNOSIS — E1122 Type 2 diabetes mellitus with diabetic chronic kidney disease: Secondary | ICD-10-CM | POA: Diagnosis not present

## 2015-04-09 LAB — COMPREHENSIVE METABOLIC PANEL
ALT: 10 IU/L (ref 0–44)
AST: 11 IU/L (ref 0–40)
Albumin/Globulin Ratio: 1.3 (ref 1.1–2.5)
Albumin: 3.9 g/dL (ref 3.6–4.8)
Alkaline Phosphatase: 73 IU/L (ref 39–117)
BUN/Creatinine Ratio: 15 (ref 10–22)
BUN: 14 mg/dL (ref 8–27)
Bilirubin Total: 0.5 mg/dL (ref 0.0–1.2)
CO2: 25 mmol/L (ref 18–29)
Calcium: 8.5 mg/dL — ABNORMAL LOW (ref 8.6–10.2)
Chloride: 100 mmol/L (ref 96–106)
Creatinine, Ser: 0.93 mg/dL (ref 0.76–1.27)
GFR calc Af Amer: 100 mL/min/{1.73_m2} (ref >59)
GFR calc non Af Amer: 86 mL/min/{1.73_m2} (ref >59)
Globulin, Total: 2.9 g/dL (ref 1.5–4.5)
Glucose: 167 mg/dL — ABNORMAL HIGH (ref 65–99)
Potassium: 3.5 mmol/L (ref 3.5–5.2)
Sodium: 143 mmol/L (ref 134–144)
Total Protein: 6.8 g/dL (ref 6.0–8.5)

## 2015-04-10 ENCOUNTER — Ambulatory Visit: Payer: 59 | Admitting: Internal Medicine

## 2015-04-11 DIAGNOSIS — R339 Retention of urine, unspecified: Secondary | ICD-10-CM | POA: Diagnosis not present

## 2015-04-11 DIAGNOSIS — C61 Malignant neoplasm of prostate: Secondary | ICD-10-CM | POA: Diagnosis not present

## 2015-04-12 DIAGNOSIS — N320 Bladder-neck obstruction: Secondary | ICD-10-CM | POA: Diagnosis not present

## 2015-04-12 DIAGNOSIS — R3914 Feeling of incomplete bladder emptying: Secondary | ICD-10-CM | POA: Diagnosis not present

## 2015-04-12 DIAGNOSIS — R339 Retention of urine, unspecified: Secondary | ICD-10-CM | POA: Diagnosis not present

## 2015-04-19 MED FILL — hydrALAZINE HCL 10 MG TABS: 10 | 30 days supply | Qty: 90 | Fill #2

## 2015-04-23 DIAGNOSIS — R339 Retention of urine, unspecified: Secondary | ICD-10-CM | POA: Diagnosis not present

## 2015-04-24 ENCOUNTER — Encounter: Payer: Self-pay | Admitting: Pharmacotherapy

## 2015-05-06 ENCOUNTER — Other Ambulatory Visit: Payer: Self-pay | Admitting: Internal Medicine

## 2015-05-06 ENCOUNTER — Other Ambulatory Visit: Payer: Self-pay | Admitting: Nurse Practitioner

## 2015-05-06 DIAGNOSIS — R3914 Feeling of incomplete bladder emptying: Secondary | ICD-10-CM | POA: Diagnosis not present

## 2015-05-06 DIAGNOSIS — N320 Bladder-neck obstruction: Secondary | ICD-10-CM | POA: Diagnosis not present

## 2015-05-06 DIAGNOSIS — R339 Retention of urine, unspecified: Secondary | ICD-10-CM | POA: Diagnosis not present

## 2015-05-06 MED FILL — AMLODIPINE BESYLATE 10 MG T: 10 | 90 days supply | Qty: 90 | Fill #1

## 2015-05-06 MED FILL — JARDIANCE 10 MG TABLET: 10 | 30 days supply | Qty: 30 | Fill #1

## 2015-05-07 MED FILL — metFORMIN HCL 500 MG TABS: 500 | 30 days supply | Qty: 60 | Fill #0

## 2015-05-07 MED FILL — HYDROCHLOROTHIAZIDE 25 MG T: 25 | 30 days supply | Qty: 30 | Fill #0

## 2015-05-08 ENCOUNTER — Encounter: Payer: Self-pay | Admitting: Internal Medicine

## 2015-05-08 ENCOUNTER — Ambulatory Visit (INDEPENDENT_AMBULATORY_CARE_PROVIDER_SITE_OTHER): Payer: 59 | Admitting: Internal Medicine

## 2015-05-08 VITALS — BP 140/80 | HR 79 | Temp 98.2°F | Resp 20 | Ht 71.0 in | Wt 318.8 lb

## 2015-05-08 DIAGNOSIS — E1142 Type 2 diabetes mellitus with diabetic polyneuropathy: Secondary | ICD-10-CM

## 2015-05-08 DIAGNOSIS — M170 Bilateral primary osteoarthritis of knee: Secondary | ICD-10-CM

## 2015-05-08 DIAGNOSIS — I10 Essential (primary) hypertension: Secondary | ICD-10-CM

## 2015-05-08 DIAGNOSIS — D509 Iron deficiency anemia, unspecified: Secondary | ICD-10-CM | POA: Diagnosis not present

## 2015-05-08 DIAGNOSIS — E785 Hyperlipidemia, unspecified: Secondary | ICD-10-CM

## 2015-05-08 NOTE — Progress Notes (Signed)
Patient ID: Thomas [name], male   DOB: [birth year], 65 y.o.   MRN: [id number]    Location:    [facility]   Place of Service:   OFFICE  Chief Complaint  Patient presents with  . Medical Management of Chronic Issues    4 month follow-up for quality measure b/p <140/90    HPI:  65 yo male seen today for f/u. He reports increased stressors at home (wife had to start HD and needs renal tx).   DM - BS stable at home 100-130s. Stopped diluting juice at home. No low BS reactions. He is taking metformin and jardiance. He missed 1 week of metformin and BS remained in 120s. A1c 6.8% in Jan 2017  HTN - BP stable on norvasc, HCTZ, coreg, lisinopril and hydralazine  CKD - Cr 0.93.   Hx prostate CA - stable. Followed by urology  Arthritis - pain controlled on zanaflex, tramadol. He only takes tramadol prn and has not seen pain clinic in 6 mos  Hx anemia (iron/B12) - takes B12 and iron supplements  Hyperlipidemia - stable on lipitor. No myalgias. LDL 87  Past Medical History  Diagnosis Date  . Diabetes mellitus   . Hypertension   . Arthritis   . Chronic kidney disease   . Cancer [facility]) 2010    Prostate    Past Surgical History  Procedure Laterality Date  . Knee surgery    . Shoulder surgery    . Hernia repair    . Prostate surgery    . Uretha surgery-2014    . Radioactive seed implant      Patient Care Team: [name], NP as PCP - General (Nurse Practitioner)  Social History   Social History  . Marital Status: Married    Spouse Name: N/A  . Number of Children: N/A  . Years of Education: N/A   Occupational History  . Not on file.   Social History Main Topics  . Smoking status: Never Smoker   . Smokeless tobacco: Never Used  . Alcohol Use: No     Comment: never  . Drug Use: No  . Sexual Activity: Not on file   Other Topics Concern  . Not on file   Social History Narrative     reports that he has never smoked. He has never used smokeless tobacco. He  reports that he does not drink alcohol or use illicit drugs.  No Known Allergies  Medications: Patient's Medications  New Prescriptions   No medications on file  Previous Medications   AMLODIPINE (NORVASC) 10 MG TABLET    TAKE 1 TABLET BY MOUTH DAILY.   ASPIRIN 81 MG TABLET    Take 81 mg by mouth daily.   ATORVASTATIN (LIPITOR) 10 MG TABLET    Take one tablet by mouth once daily for cholesterol   CARVEDILOL (COREG) 6.25 MG TABLET    TAKE 1 TABLET BY MOUTH 2 TIMES DAILY WITH A MEAL.   GLUCOSE BLOOD (TRUE METRIX BLOOD GLUCOSE TEST) TEST STRIP    Use to check blood sugar twice a day, (DX code  E11.42)   HYDRALAZINE (APRESOLINE) 10 MG TABLET    Take 1 tablet (10 mg total) by mouth 3 (three) times daily.   HYDROCHLOROTHIAZIDE (HYDRODIURIL) 25 MG TABLET    TAKE 1 TABLET BY MOUTH DAILY.   JARDIANCE 10 MG TABS TABLET    TAKE 1 TABLET BY MOUTH ONCE DAILY   LISINOPRIL (PRINIVIL,ZESTRIL) 40 MG TABLET    Take 1  tablet (40 mg total) by mouth daily.   METFORMIN (GLUCOPHAGE) 500 MG TABLET    TAKE 1 TABLET BY MOUTH 2 TIMES A DAY WITH A MEAL   OMEPRAZOLE (PRILOSEC) 20 MG CAPSULE    Take 1 capsule (20 mg total) by mouth as needed.   POLY-IRON 150 150 MG CAPSULE    TAKE 1 CAPSULE BY MOUTH 2 TIMES DAILY.   POTASSIUM CHLORIDE SA (K-DUR,KLOR-CON) 20 MEQ TABLET    1 by mouth twice daily x 3 days, then 1 by mouth daily   TIZANIDINE (ZANAFLEX) 4 MG CAPSULE    Take 1 capsule (4 mg total) by mouth 3 (three) times daily as needed for muscle spasms.   TRAMADOL (ULTRAM) 50 MG TABLET    Take 1 tablet (50 mg total) by mouth every 8 (eight) hours as needed (pain).   TRUEPLUS LANCETS 30G MISC    USE TO TEST BLOOD SUGAR 2 TIMES A DAY   VITAMIN B-12 (CYANOCOBALAMIN) 1000 MCG TABLET    Take 1,000 mcg by mouth daily.  Modified Medications   No medications on file  Discontinued Medications   No medications on file    Review of Systems  Musculoskeletal: Positive for arthralgias.  All other systems reviewed and are  negative.   Filed Vitals:   05/08/15 1622  BP: 140/80  Pulse: 79  Temp: 98.2 F (36.8 C)  TempSrc: Oral  Resp: 20  Height: 5\' 11"  (1.803 m)  Weight: 318 lb 12.8 oz (144.607 kg)  SpO2: 95%   Body mass index is 44.48 kg/(m^2).  Physical Exam  Constitutional: He appears well-developed and well-nourished.  Looks tired in NAD  HENT:  Mouth/Throat: Oropharynx is clear and moist.  Eyes: Pupils are equal, round, and reactive to light. No scleral icterus.  Neck: Neck supple. Carotid bruit is not present. No thyromegaly present.  Cardiovascular: Normal rate, regular rhythm, normal heart sounds and intact distal pulses.  Exam reveals no gallop and no friction rub.   No murmur heard. no distal LE swelling. No calf TTP  Pulmonary/Chest: Effort normal and breath sounds normal. He has no wheezes. He has no rales. He exhibits no tenderness.  Abdominal: Soft. Bowel sounds are normal. He exhibits no distension, no abdominal bruit, no pulsatile midline mass and no mass. There is no tenderness. There is no rebound and no guarding.  Musculoskeletal: He exhibits edema.  Lymphadenopathy:    He has no cervical adenopathy.  Neurological: He is alert.  Skin: Skin is warm and dry. No rash noted.  Psychiatric: He has a normal mood and affect. His behavior is normal. Judgment and thought content normal.     Labs reviewed: Appointment on 04/08/2015  Component Date Value Ref Range Status  . Glucose 04/08/2015 167* 65 - 99 mg/dL Final  . BUN 04/08/2015 14  8 - 27 mg/dL Final  . Creatinine, Ser 04/08/2015 0.93  0.76 - 1.27 mg/dL Final  . GFR calc non Af Amer 04/08/2015 86  >59 mL/min/1.73 Final  . GFR calc Af Amer 04/08/2015 100  >59 mL/min/1.73 Final  . BUN/Creatinine Ratio 04/08/2015 15  10 - 22 Final  . Sodium 04/08/2015 143  134 - 144 mmol/L Final  . Potassium 04/08/2015 3.5  3.5 - 5.2 mmol/L Final  . Chloride 04/08/2015 100  96 - 106 mmol/L Final  . CO2 04/08/2015 25  18 - 29 mmol/L Final  .  Calcium 04/08/2015 8.5* 8.6 - 10.2 mg/dL Final  . Total Protein 04/08/2015 6.8  6.0 -  8.5 g/dL Final  . Albumin 04/08/2015 3.9  3.6 - 4.8 g/dL Final  . Globulin, Total 04/08/2015 2.9  1.5 - 4.5 g/dL Final  . Albumin/Globulin Ratio 04/08/2015 1.3  1.1 - 2.5 Final   Comment: **Effective April 29, 2015 the reference interval**   for A/G Ratio will be changing to:              Age                Male          Male           0 -  7 days       1.1 - 2.3       1.1 - 2.3           8 - 30 days       1.2 - 2.8       1.2 - 2.8           1 -  6 months     1.3 - 3.6       1.3 - 3.6    7 months -  5 years      1.5 - 2.6       1.5 - 2.6              > 5 years      1.2 - 2.2       1.2 - 2.2   . Bilirubin Total 04/08/2015 0.5  0.0 - 1.2 mg/dL Final  . Alkaline Phosphatase 04/08/2015 73  39 - 117 IU/L Final  . AST 04/08/2015 11  0 - 40 IU/L Final  . ALT 04/08/2015 10  0 - 44 IU/L Final  Appointment on 03/08/2015  Component Date Value Ref Range Status  . Hgb A1c MFr Bld 03/08/2015 6.8* 4.8 - 5.6 % Final   Comment:          Pre-diabetes: 5.7 - 6.4          Diabetes: >6.4          Glycemic control for adults with diabetes: <7.0   . Est. average glucose Bld gHb Est-m* 03/08/2015 148   Final  . Glucose 03/08/2015 178* 65 - 99 mg/dL Final  . BUN 03/08/2015 22  8 - 27 mg/dL Final  . Creatinine, Ser 03/08/2015 1.28* 0.76 - 1.27 mg/dL Final  . GFR calc non Af Amer 03/08/2015 59* >59 mL/min/1.73 Final  . GFR calc Af Amer 03/08/2015 68  >59 mL/min/1.73 Final  . BUN/Creatinine Ratio 03/08/2015 17  10 - 22 Final  . Sodium 03/08/2015 143  134 - 144 mmol/L Final  . Potassium 03/08/2015 3.5  3.5 - 5.2 mmol/L Final  . Chloride 03/08/2015 99  96 - 106 mmol/L Final  . CO2 03/08/2015 29  18 - 29 mmol/L Final  . Calcium 03/08/2015 8.5* 8.6 - 10.2 mg/dL Final  . Total Protein 03/08/2015 6.9  6.0 - 8.5 g/dL Final  . Albumin 03/08/2015 3.9  3.6 - 4.8 g/dL Final  . Globulin, Total 03/08/2015 3.0  1.5 - 4.5 g/dL Final    . Albumin/Globulin Ratio 03/08/2015 1.3  1.1 - 2.5 Final  . Bilirubin Total 03/08/2015 0.5  0.0 - 1.2 mg/dL Final  . Alkaline Phosphatase 03/08/2015 82  39 - 117 IU/L Final  . AST 03/08/2015 13  0 - 40 IU/L Final  . ALT 03/08/2015 9  0 - 44 IU/L Final  . Cholesterol, Total 03/08/2015 153  100 - 199 mg/dL Final  . Triglycerides 03/08/2015 70  0 - 149 mg/dL Final  . HDL 03/08/2015 52  >39 mg/dL Final  . VLDL Cholesterol Cal 03/08/2015 14  5 - 40 mg/dL Final  . LDL Calculated 03/08/2015 87  0 - 99 mg/dL Final  . Chol/HDL Ratio 03/08/2015 2.9  0.0 - 5.0 ratio units Final   Comment:                                   T. Chol/HDL Ratio                                             Men  Women                               1/2 Avg.Risk  3.4    3.3                                   Avg.Risk  5.0    4.4                                2X Avg.Risk  9.6    7.1                                3X Avg.Risk 23.4   11.0   . ALT 03/08/2015 10  0 - 44 IU/L Final  . WBC 03/08/2015 7.4  3.4 - 10.8 x10E3/uL Final  . RBC 03/08/2015 4.69  4.14 - 5.80 x10E6/uL Final  . Hemoglobin 03/08/2015 12.7  12.6 - 17.7 g/dL Final  . Hematocrit 03/08/2015 38.7  37.5 - 51.0 % Final  . MCV 03/08/2015 83  79 - 97 fL Final  . MCH 03/08/2015 27.1  26.6 - 33.0 pg Final  . MCHC 03/08/2015 32.8  31.5 - 35.7 g/dL Final  . RDW 03/08/2015 13.7  12.3 - 15.4 % Final  . Platelets 03/08/2015 287  150 - 379 x10E3/uL Final  . Neutrophils 03/08/2015 64   Final  . Lymphs 03/08/2015 30   Final  . Monocytes 03/08/2015 5   Final  . Eos 03/08/2015 1   Final  . Basos 03/08/2015 0   Final  . Neutrophils Absolute 03/08/2015 4.7  1.4 - 7.0 x10E3/uL Final  . Lymphocytes Absolute 03/08/2015 2.2  0.7 - 3.1 x10E3/uL Final  . Monocytes Absolute 03/08/2015 0.4  0.1 - 0.9 x10E3/uL Final  . EOS (ABSOLUTE) 03/08/2015 0.1  0.0 - 0.4 x10E3/uL Final  . Basophils Absolute 03/08/2015 0.0  0.0 - 0.2 x10E3/uL Final  . Immature Granulocytes 03/08/2015 0   Final   . Immature Grans (Abs) 03/08/2015 0.0  0.0 - 0.1 x10E3/uL Final  . Ferritin 03/08/2015 194  30 - 400 ng/mL Final  . Iron 03/08/2015 42  38 - 169 ug/dL Final  . Vitamin B-12 03/08/2015 415  211 - 946 pg/mL Final    No results found.   Assessment/Plan   ICD-9-CM ICD-10-CM   1. Essential hypertension 401.9 I10   2. DM type 2 with  diabetic peripheral neuropathy (HCC) 250.60 E11.42    357.2    3. Anemia, iron deficiency 280.9 D50.9   4. Hyperlipidemia with target LDL less than 100 272.4 E78.5   5. Primary osteoarthritis of both knees 715.16 M17.0   6. Morbid obesity due to excess calories ([place]) 278.01 E66.01    Continue current medications as ordered  Continue diet and exercise program  Follow up in 4 mos for routine visit  Tamanna Whitson S. Perlie Gold  [facility] [address] [place], [place] [address] [phone] Cell (Monday-Friday 8 AM - 5 PM) [phone] After 5 PM and follow prompts

## 2015-05-08 NOTE — Patient Instructions (Signed)
Continue current medications as ordered  Continue diet and exercise program  Follow up in 4 mos for routine visit

## 2015-05-28 DIAGNOSIS — R339 Retention of urine, unspecified: Secondary | ICD-10-CM | POA: Diagnosis not present

## 2015-06-06 ENCOUNTER — Other Ambulatory Visit: Payer: Self-pay | Admitting: Nurse Practitioner

## 2015-06-06 MED FILL — HYDROCHLOROTHIAZIDE 25 MG T: 25 | 30 days supply | Qty: 30 | Fill #1

## 2015-06-06 MED FILL — metFORMIN HCL 500 MG TABS: 500 | 30 days supply | Qty: 60 | Fill #1

## 2015-06-07 MED FILL — JARDIANCE 10 MG TABLET: 10 | 30 days supply | Qty: 30 | Fill #2

## 2015-06-07 MED FILL — traMADol HCL 50 MG TABS: 50 | 30 days supply | Qty: 90 | Fill #0

## 2015-06-10 ENCOUNTER — Other Ambulatory Visit: Payer: Self-pay | Admitting: Internal Medicine

## 2015-06-10 ENCOUNTER — Telehealth: Payer: Self-pay | Admitting: *Deleted

## 2015-06-10 ENCOUNTER — Other Ambulatory Visit: Payer: Self-pay | Admitting: *Deleted

## 2015-06-10 DIAGNOSIS — C61 Malignant neoplasm of prostate: Secondary | ICD-10-CM | POA: Diagnosis not present

## 2015-06-10 DIAGNOSIS — N359 Urethral stricture, unspecified: Secondary | ICD-10-CM | POA: Diagnosis not present

## 2015-06-10 DIAGNOSIS — R109 Unspecified abdominal pain: Secondary | ICD-10-CM | POA: Diagnosis not present

## 2015-06-10 DIAGNOSIS — R339 Retention of urine, unspecified: Secondary | ICD-10-CM | POA: Diagnosis not present

## 2015-06-10 MED ORDER — GLIPIZIDE 5 MG PO TABS: ORAL_TABLET | ORAL | Status: DC

## 2015-06-10 MED FILL — glipiZIDE 5 MG TABS: 5 | 90 days supply | Qty: 90 | Fill #0

## 2015-06-10 NOTE — Telephone Encounter (Signed)
Patient requested refill to pharmacy.  

## 2015-06-10 NOTE — Telephone Encounter (Signed)
Patient called and LMOM that he was having Mid line pain and thinking it is a UTI. I called patient back to offer him an appointment for today and he has already called his Urologist and going there today to be seen.

## 2015-06-25 DIAGNOSIS — R3914 Feeling of incomplete bladder emptying: Secondary | ICD-10-CM | POA: Diagnosis not present

## 2015-06-25 DIAGNOSIS — N320 Bladder-neck obstruction: Secondary | ICD-10-CM | POA: Diagnosis not present

## 2015-06-25 DIAGNOSIS — R339 Retention of urine, unspecified: Secondary | ICD-10-CM | POA: Diagnosis not present

## 2015-06-26 ENCOUNTER — Other Ambulatory Visit: Payer: 59

## 2015-06-28 ENCOUNTER — Other Ambulatory Visit: Payer: 59

## 2015-06-28 ENCOUNTER — Other Ambulatory Visit: Payer: Self-pay | Admitting: Pharmacotherapy

## 2015-06-28 DIAGNOSIS — E1122 Type 2 diabetes mellitus with diabetic chronic kidney disease: Secondary | ICD-10-CM

## 2015-06-29 LAB — COMPREHENSIVE METABOLIC PANEL
ALT: 10 IU/L (ref 0–44)
AST: 13 IU/L (ref 0–40)
Albumin/Globulin Ratio: 1.4 (ref 1.2–2.2)
Albumin: 4.1 g/dL (ref 3.6–4.8)
Alkaline Phosphatase: 74 IU/L (ref 39–117)
BUN/Creatinine Ratio: 14 (ref 10–24)
BUN: 14 mg/dL (ref 8–27)
Bilirubin Total: 0.5 mg/dL (ref 0.0–1.2)
CO2: 28 mmol/L (ref 18–29)
Calcium: 8.9 mg/dL (ref 8.6–10.2)
Chloride: 103 mmol/L (ref 96–106)
Creatinine, Ser: 0.97 mg/dL (ref 0.76–1.27)
GFR calc Af Amer: 94 mL/min/{1.73_m2} (ref >59)
GFR calc non Af Amer: 82 mL/min/{1.73_m2} (ref >59)
Globulin, Total: 3 g/dL (ref 1.5–4.5)
Glucose: 196 mg/dL — ABNORMAL HIGH (ref 65–99)
Potassium: 4.2 mmol/L (ref 3.5–5.2)
Sodium: 147 mmol/L — ABNORMAL HIGH (ref 134–144)
Total Protein: 7.1 g/dL (ref 6.0–8.5)

## 2015-07-01 ENCOUNTER — Other Ambulatory Visit: Payer: 59

## 2015-07-01 ENCOUNTER — Encounter: Payer: Self-pay | Admitting: Pharmacotherapy

## 2015-07-01 ENCOUNTER — Ambulatory Visit (INDEPENDENT_AMBULATORY_CARE_PROVIDER_SITE_OTHER): Payer: 59 | Admitting: Pharmacotherapy

## 2015-07-01 ENCOUNTER — Other Ambulatory Visit: Payer: Self-pay | Admitting: Internal Medicine

## 2015-07-01 VITALS — BP 140/88 | HR 70 | Resp 12 | Ht 71.0 in | Wt 320.0 lb

## 2015-07-01 DIAGNOSIS — E1142 Type 2 diabetes mellitus with diabetic polyneuropathy: Secondary | ICD-10-CM

## 2015-07-01 DIAGNOSIS — I10 Essential (primary) hypertension: Secondary | ICD-10-CM | POA: Diagnosis not present

## 2015-07-01 DIAGNOSIS — E1122 Type 2 diabetes mellitus with diabetic chronic kidney disease: Secondary | ICD-10-CM | POA: Diagnosis not present

## 2015-07-01 MED FILL — metFORMIN HCL 500 MG TABS: 500 | 30 days supply | Qty: 60 | Fill #2

## 2015-07-01 MED FILL — TRUEplus LANCETS 30G MISC: 50 days supply | Qty: 100 | Fill #1

## 2015-07-01 MED FILL — LISINOPRIL 40 MG TABLET: 40 | 90 days supply | Qty: 90 | Fill #1

## 2015-07-01 MED FILL — hydrALAZINE HCL 10 MG TABS: 10 | 30 days supply | Qty: 90 | Fill #0

## 2015-07-01 MED FILL — HYDROCHLOROTHIAZIDE 25 MG T: 25 | 30 days supply | Qty: 30 | Fill #2

## 2015-07-01 MED FILL — TRUE METRIX GLUCOSE TEST ST: 50 days supply | Qty: 100 | Fill #2

## 2015-07-01 MED FILL — ATORVASTATIN 10 MG TABLET: 10 | 90 days supply | Qty: 90 | Fill #2

## 2015-07-01 NOTE — Patient Instructions (Signed)
Restart the Lisinopril Exercise goal is 30-45 minutes 5 x week.

## 2015-07-01 NOTE — Progress Notes (Signed)
  Subjective:    Thomas [name] is a 65 y.o.African American male who presents for follow-up of Type 2 diabetes mellitus.   A1C:  6.8% Currently on metformin, glipizide, and Jardiance. Home BG:  125-130 No hypoglycemia  Not sleeping well - using sleeping pills. Has been under more stress. Did have 2 slices of cheesecake on his birthday. Trying to eat healthy Drinking a lot of water. No routine exercise.  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  Wears glasses. Denies problems with vision.  New glasses from Dr Idolina Primer. Has peripheral edema. Numbness in feet.  No open areas.  Checks feet daily Nocturia 5 times per night Denies dysuria.  Out of lisinopril, hydralazine.  Review of Systems A comprehensive review of systems was negative except for: Constitutional: positive for not sleeping well Cardiovascular: positive for lower extremity edema Genitourinary: positive for nocturia    Objective:    BP 140/88 mmHg  Pulse 70  Resp 12  Ht 5' 11"$  (1.803 m)  Wt 320 lb (145.151 kg)  BMI 44.65 kg/m2  SpO2 97%  General:  alert, cooperative and no distress  Oropharynx: normal findings: lips normal without lesions and gums healthy   Eyes:  negative findings: lids and lashes normal and conjunctivae and sclerae normal   Ears:  external ears normal        Lung: clear to auscultation bilaterally  Heart:  regular rate and rhythm     Extremities: edema bilateral lower extremities  Skin: dry     Neuro: mental status, speech normal, alert and oriented x3 and gait and station normal   Lab Review GLUCOSE (mg/dL)  Date Value  06/28/2015 196*  04/08/2015 167*  03/08/2015 178*   GLUCOSE, BLD (mg/dL)  Date Value  05/19/2014 99  11/16/2011 328*  12/06/2009 232*   CO2 (mmol/L)  Date Value  06/28/2015 28  04/08/2015 25  03/08/2015 29   BUN (mg/dL)  Date Value  06/28/2015 14  04/08/2015 14  03/08/2015 22  05/19/2014 17  11/16/2011 10  12/06/2009 10   CREATININE,  SER (mg/dL)  Date Value  06/28/2015 0.97  04/08/2015 0.93  03/08/2015 1.28*       Assessment:    Diabetes Mellitus type II, under good control. A1C at goal <7% BP at goal <140/90   Plan:    1.  Rx changes: none  2.  Continue Jardiance, metformin, and glipizide. 3.  BP at goal, but higher than expected due to not taking lisinopril.  Restart Lisinopril 80m daily. 4.  Counseled on nutrition goals. 5.  Exercise goal is 30-45 minutes 5 x week.

## 2015-07-02 LAB — MICROALBUMIN / CREATININE URINE RATIO
Creatinine, Urine: 72.9 mg/dL
MICROALB/CREAT RATIO: 70.6 mg/g creat — ABNORMAL HIGH (ref 0.0–30.0)
Microalbumin, Urine: 51.5 ug/mL

## 2015-07-03 ENCOUNTER — Other Ambulatory Visit: Payer: 59

## 2015-07-08 ENCOUNTER — Ambulatory Visit: Payer: 59 | Admitting: Pharmacotherapy

## 2015-07-11 DIAGNOSIS — R339 Retention of urine, unspecified: Secondary | ICD-10-CM | POA: Diagnosis not present

## 2015-07-11 DIAGNOSIS — C61 Malignant neoplasm of prostate: Secondary | ICD-10-CM | POA: Diagnosis not present

## 2015-07-17 DIAGNOSIS — R339 Retention of urine, unspecified: Secondary | ICD-10-CM | POA: Diagnosis not present

## 2015-07-17 DIAGNOSIS — N320 Bladder-neck obstruction: Secondary | ICD-10-CM | POA: Diagnosis not present

## 2015-07-17 DIAGNOSIS — R3914 Feeling of incomplete bladder emptying: Secondary | ICD-10-CM | POA: Diagnosis not present

## 2015-07-25 ENCOUNTER — Other Ambulatory Visit: Payer: Self-pay | Admitting: Nurse Practitioner

## 2015-07-25 MED FILL — CARVEDILOL 6.25 MG TABLET: 6.25 | 30 days supply | Qty: 60 | Fill #0

## 2015-08-21 ENCOUNTER — Other Ambulatory Visit: Payer: Self-pay | Admitting: Internal Medicine

## 2015-08-21 MED FILL — KLOR-CON M20 TABLET: 20 | 42 days supply | Qty: 45 | Fill #0

## 2015-09-27 ENCOUNTER — Other Ambulatory Visit: Payer: Self-pay | Admitting: *Deleted

## 2015-09-27 ENCOUNTER — Telehealth: Payer: Self-pay | Admitting: *Deleted

## 2015-09-27 MED ORDER — METFORMIN HCL 500 MG PO TABS: ORAL_TABLET | ORAL | 1 refills | Status: DC

## 2015-09-27 MED ORDER — AMLODIPINE BESYLATE 10 MG PO TABS: 10.0000 mg | ORAL_TABLET | Freq: Every day | ORAL | 1 refills | Status: DC

## 2015-09-27 MED FILL — POLY-IRON 150 MG CAPSULE: 150 | 30 days supply | Qty: 60 | Fill #1

## 2015-09-27 MED FILL — KLOR-CON M20 TABLET: 20 | 42 days supply | Qty: 45 | Fill #1

## 2015-09-27 MED FILL — AMLODIPINE BESYLATE 10 MG T: 10 | 30 days supply | Qty: 30 | Fill #0

## 2015-09-27 MED FILL — CARVEDILOL 6.25 MG TABLET: 6.25 | 30 days supply | Qty: 60 | Fill #1

## 2015-09-27 MED FILL — LISINOPRIL 40 MG TABLET: 40 | 90 days supply | Qty: 90 | Fill #2

## 2015-09-27 MED FILL — metFORMIN HCL 500 MG TABS: 500 | 30 days supply | Qty: 60 | Fill #0

## 2015-09-27 NOTE — Telephone Encounter (Signed)
Pt overdue to office visit, please schedule routine follow up and we can go over medication at this time

## 2015-09-27 NOTE — Telephone Encounter (Signed)
Scheduled an appointment for Monday. Patient has not been taking his medication for 3 weeks now.

## 2015-09-27 NOTE — Telephone Encounter (Signed)
Patient called and stated that he cannot afford Jardiance $400 nor Hydralazine $300. Patient would like generic or something else prescribed. Please Advise.

## 2015-09-30 ENCOUNTER — Ambulatory Visit (INDEPENDENT_AMBULATORY_CARE_PROVIDER_SITE_OTHER): Payer: Medicare Other | Admitting: Nurse Practitioner

## 2015-09-30 ENCOUNTER — Encounter: Payer: Self-pay | Admitting: Nurse Practitioner

## 2015-09-30 VITALS — BP 140/98 | HR 68 | Temp 98.4°F | Resp 17 | Ht 71.0 in | Wt 333.4 lb

## 2015-09-30 DIAGNOSIS — M170 Bilateral primary osteoarthritis of knee: Secondary | ICD-10-CM

## 2015-09-30 DIAGNOSIS — I10 Essential (primary) hypertension: Secondary | ICD-10-CM | POA: Diagnosis not present

## 2015-09-30 DIAGNOSIS — E785 Hyperlipidemia, unspecified: Secondary | ICD-10-CM

## 2015-09-30 DIAGNOSIS — E538 Deficiency of other specified B group vitamins: Secondary | ICD-10-CM

## 2015-09-30 DIAGNOSIS — E1122 Type 2 diabetes mellitus with diabetic chronic kidney disease: Secondary | ICD-10-CM

## 2015-09-30 LAB — COMPLETE METABOLIC PANEL WITH GFR
ALT: 12 U/L (ref 9–46)
AST: 10 U/L (ref 10–35)
Albumin: 3.7 g/dL (ref 3.6–5.1)
Alkaline Phosphatase: 98 U/L (ref 40–115)
BUN: 20 mg/dL (ref 7–25)
CO2: 32 mmol/L — ABNORMAL HIGH (ref 20–31)
Calcium: 9.3 mg/dL (ref 8.6–10.3)
Chloride: 97 mmol/L — ABNORMAL LOW (ref 98–110)
Creat: 1.18 mg/dL (ref 0.70–1.25)
GFR, Est African American: 74 mL/min (ref >=60)
GFR, Est Non African American: 64 mL/min (ref >=60)
Glucose, Bld: 380 mg/dL — ABNORMAL HIGH (ref 65–99)
Potassium: 4.1 mmol/L (ref 3.5–5.3)
Sodium: 140 mmol/L (ref 135–146)
Total Bilirubin: 0.6 mg/dL (ref 0.2–1.2)
Total Protein: 7 g/dL (ref 6.1–8.1)

## 2015-09-30 LAB — CBC WITH DIFFERENTIAL/PLATELET
Basophils Absolute: 78 cells/uL (ref 0–200)
Basophils Relative: 1 %
Eosinophils Absolute: 0 cells/uL — ABNORMAL LOW (ref 15–500)
Eosinophils Relative: 0 %
HCT: 41.8 % (ref 38.5–50.0)
Hemoglobin: 13.6 g/dL (ref 13.2–17.1)
Lymphs Abs: 1716 cells/uL (ref 850–3900)
MCH: 27.1 pg (ref 27.0–33.0)
MCHC: 32.5 g/dL (ref 32.0–36.0)
MCV: 83.4 fL (ref 80.0–100.0)
MPV: 11 fL (ref 7.5–12.5)
Monocytes Absolute: 390 cells/uL (ref 200–950)
Monocytes Relative: 5 %
Neutro Abs: 5616 cells/uL (ref 1500–7800)
Neutrophils Relative %: 72 %
Platelets: 263 10*3/uL (ref 140–400)
RBC: 5.01 MIL/uL (ref 4.20–5.80)
RDW: 14.4 % (ref 11.0–15.0)
WBC: 7.8 10*3/uL (ref 3.8–10.8)

## 2015-09-30 MED ORDER — ATORVASTATIN CALCIUM 10 MG PO TABS: ORAL_TABLET | ORAL | 3 refills | Status: DC

## 2015-09-30 MED ORDER — HYDROCHLOROTHIAZIDE 25 MG PO TABS: 25.0000 mg | ORAL_TABLET | Freq: Every day | ORAL | 1 refills | Status: DC

## 2015-09-30 MED ORDER — GLIPIZIDE 5 MG PO TABS: 5.0000 mg | ORAL_TABLET | Freq: Every day | ORAL | 1 refills | Status: DC

## 2015-09-30 MED ORDER — AMLODIPINE BESYLATE 10 MG PO TABS: 10.0000 mg | ORAL_TABLET | Freq: Every day | ORAL | 1 refills | Status: DC

## 2015-09-30 MED ORDER — CARVEDILOL 6.25 MG PO TABS: 6.2500 mg | ORAL_TABLET | Freq: Two times a day (BID) | ORAL | 1 refills | Status: DC

## 2015-09-30 MED ORDER — POTASSIUM CHLORIDE CRYS ER 20 MEQ PO TBCR: 20.0000 meq | EXTENDED_RELEASE_TABLET | Freq: Every day | ORAL | 1 refills | Status: DC

## 2015-09-30 MED ORDER — TRAMADOL HCL 50 MG PO TABS: 50.0000 mg | ORAL_TABLET | Freq: Three times a day (TID) | ORAL | 1 refills | Status: DC | PRN

## 2015-09-30 MED ORDER — LISINOPRIL 40 MG PO TABS: 40.0000 mg | ORAL_TABLET | Freq: Every day | ORAL | 1 refills | Status: DC

## 2015-09-30 MED ORDER — METFORMIN HCL 500 MG PO TABS: ORAL_TABLET | ORAL | 1 refills | Status: DC

## 2015-09-30 MED FILL — HYDROCHLOROTHIAZIDE 25 MG T: 25 | 90 days supply | Qty: 90 | Fill #0

## 2015-09-30 MED FILL — glipiZIDE 5 MG TABS: 5 | 90 days supply | Qty: 90 | Fill #0

## 2015-09-30 MED FILL — ATORVASTATIN 10 MG TABLET: 10 | 90 days supply | Qty: 90 | Fill #0

## 2015-09-30 MED FILL — traMADol HCL 50 MG TABS: 50 | 30 days supply | Qty: 90 | Fill #0

## 2015-09-30 NOTE — Progress Notes (Signed)
Careteam: Patient Care Team: [name], NP as PCP - General (Nurse Practitioner)  Advanced Directive information Does patient have an advance directive?: No, Would patient like information on creating an advanced directive?: Yes - Educational materials given (Patient was given paperwork but has not had time to complete it. )  No Known Allergies  Chief Complaint  Patient presents with  . Medical Management of Chronic Issues    Discuss cost of medications. Patient has been out of most medications for one month.      HPI: Patient is a 65 y.o. male seen in the office today for routine follow up.  Reports medication too expensive. Has been out for at least a month.  Normally gets medication from [facility] pills were too much- over 700$ had to meet a deducible.  Has had metformin which he took this morning-- blood sugars have been 150-160  After he eats.  Last week started back at the pool doing some exercise. Has the mindset he wants to get back into it. Going today Attempting to eat healthier- went back to drinking soda and eating bread- trying to cut back on that.  Having to use tramadol more frequently now that he is back in the pool again. Will take a dose before he starts exercising to help the pain. Medication effective without adverse effects  Review of Systems:  Review of Systems  Constitutional: Negative for activity change, chills and fatigue.  HENT: Negative for sore throat and trouble swallowing.   Eyes: Negative for visual disturbance.  Respiratory: Negative for cough, chest tightness and shortness of breath.   Cardiovascular: Negative for chest pain, palpitations and leg swelling.  Gastrointestinal: Negative for abdominal pain, blood in stool, nausea and vomiting.  Genitourinary: Negative for difficulty urinating, frequency and urgency.  Musculoskeletal: Positive for arthralgias. Negative for joint swelling.  Skin: Negative for rash.  Neurological: Negative  for weakness and headaches.  Psychiatric/Behavioral: Negative for confusion and sleep disturbance. The patient is not nervous/anxious.     Past Medical History:  Diagnosis Date  . Arthritis   . Cancer [facility]) 2010   Prostate  . Chronic kidney disease   . Diabetes mellitus   . Hypertension    Past Surgical History:  Procedure Laterality Date  . HERNIA REPAIR    . KNEE SURGERY    . PROSTATE SURGERY    . RADIOACTIVE SEED IMPLANT    . SHOULDER SURGERY    . uretha surgery-2014     Social History:   reports that he has never smoked. He has never used smokeless tobacco. He reports that he does not drink alcohol or use drugs.  History reviewed. No pertinent family history.  Medications: Patient's Medications  New Prescriptions   No medications on file  Previous Medications   AMLODIPINE (NORVASC) 10 MG TABLET    Take 1 tablet (10 mg total) by mouth daily.   ASPIRIN 81 MG TABLET    Take 81 mg by mouth daily.   ATORVASTATIN (LIPITOR) 10 MG TABLET    Take one tablet by mouth once daily for cholesterol   CARVEDILOL (COREG) 6.25 MG TABLET    TAKE 1 TABLET BY MOUTH TWICE DAILY WITH MEALS   GLIPIZIDE (GLUCOTROL) 5 MG TABLET    Take one tablet by mouth once daily before breakfast to control blood sugar.   GLUCOSE BLOOD (TRUE METRIX BLOOD GLUCOSE TEST) TEST STRIP    Use to check blood sugar twice a day, (DX code  E11.42)  HYDRALAZINE (APRESOLINE) 10 MG TABLET    Take 1 tablet (10 mg total) by mouth 3 (three) times daily.   HYDROCHLOROTHIAZIDE (HYDRODIURIL) 25 MG TABLET    TAKE 1 TABLET BY MOUTH DAILY.   JARDIANCE 10 MG TABS TABLET    TAKE 1 TABLET BY MOUTH ONCE DAILY   LISINOPRIL (PRINIVIL,ZESTRIL) 40 MG TABLET    Take 1 tablet (40 mg total) by mouth daily.   METFORMIN (GLUCOPHAGE) 500 MG TABLET    TAKE 1 TABLET BY MOUTH 2 TIMES A DAY WITH A MEAL   OMEPRAZOLE (PRILOSEC) 20 MG CAPSULE    Take 1 capsule (20 mg total) by mouth as needed.   POLY-IRON 150 150 MG CAPSULE    TAKE 1 CAPSULE BY MOUTH  2 TIMES DAILY.   POTASSIUM CHLORIDE SA (K-DUR,KLOR-CON) 20 MEQ TABLET    TAKE 1 TABLET BY MOUTH 2 TIMES A DAY FOR 3 DAYS, THEN 1 BY MOUTH DAILY THEREAFTER   TIZANIDINE (ZANAFLEX) 4 MG CAPSULE    Take 1 capsule (4 mg total) by mouth 3 (three) times daily as needed for muscle spasms.   TRAMADOL (ULTRAM) 50 MG TABLET    TAKE 1 TABLET BY MOUTH EVERY 8 HOURS AS NEEDED FOR PAIN   TRUEPLUS LANCETS 30G MISC    USE TO TEST BLOOD SUGAR 2 TIMES A DAY   VITAMIN B-12 (CYANOCOBALAMIN) 1000 MCG TABLET    Take 1,000 mcg by mouth daily.  Modified Medications   No medications on file  Discontinued Medications   HYDRALAZINE (APRESOLINE) 10 MG TABLET    TAKE 1 TABLET (10 MG TOTAL) BY MOUTH 3 (THREE) TIMES DAILY.     Physical Exam:  Vitals:   09/30/15 0849  BP: (!) 140/98  Pulse: 68  Resp: 17  Temp: 98.4 F (36.9 C)  TempSrc: Oral  SpO2: 96%  Weight: (!) 333 lb 6.4 oz (151.2 kg)  Height: 5\' 11"  (1.803 m)   Body mass index is 46.5 kg/m.  Physical Exam  Constitutional: He is oriented to person, place, and time. He appears well-developed and well-nourished.  HENT:  Mouth/Throat: Oropharynx is clear and moist.  Eyes: Pupils are equal, round, and reactive to light.  Neck: Neck supple. Carotid bruit is not present.  Cardiovascular: Normal rate, regular rhythm, normal heart sounds and intact distal pulses.   Pulmonary/Chest: Effort normal and breath sounds normal. No respiratory distress.  Abdominal: Soft. Bowel sounds are normal. He exhibits no distension, no abdominal bruit and no pulsatile midline mass. There is no tenderness.  Musculoskeletal: He exhibits no edema.  Neurological: He is alert and oriented to person, place, and time. No sensory deficit.  Intact sensation with monofilament  Skin: Skin is warm and dry.  Psychiatric: He has a normal mood and affect. His behavior is normal. Judgment and thought content normal.    Labs reviewed: Basic Metabolic Panel:  Recent Labs  03/08/15 0834  04/08/15 0907 06/28/15 0942  NA 143 143 147*  K 3.5 3.5 4.2  CL 99 100 103  CO2 29 25 28   GLUCOSE 178* 167* 196*  BUN 22 14 14   CREATININE 1.28* 0.93 0.97  CALCIUM 8.5* 8.5* 8.9   Liver Function Tests:  Recent Labs  03/08/15 0834 04/08/15 0907 06/28/15 0942  AST 13 11 13   ALT 10  9 10 10   ALKPHOS 82 73 74  BILITOT 0.5 0.5 0.5  PROT 6.9 6.8 7.1  ALBUMIN 3.9 3.9 4.1   No results for input(s): LIPASE, AMYLASE in the last  8760 hours. No results for input(s): AMMONIA in the last 8760 hours. CBC:  Recent Labs  03/08/15 0834  WBC 7.4  NEUTROABS 4.7  HCT 38.7  MCV 83  PLT 287   Lipid Panel:  Recent Labs  03/08/15 0834  CHOL 153  HDL 52  LDLCALC 87  TRIG 70  CHOLHDL 2.9   TSH: No results for input(s): TSH in the last 8760 hours. A1C: Lab Results  Component Value Date   HGBA1C 6.8 (H) 03/08/2015     Assessment/Plan 1. Essential hypertension - elevated today, has been off medication. Discussed medication and diet compliance  - need diabetic eye exam, said he has this scheduled - amLODipine (NORVASC) 10 MG tablet; Take 1 tablet (10 mg total) by mouth daily. For high blood pressure  Dispense: 90 tablet; Refill: 1 - carvedilol (COREG) 6.25 MG tablet; Take 1 tablet (6.25 mg total) by mouth 2 (two) times daily with a meal. For high blood pressure  Dispense: 180 tablet; Refill: 1 - hydrochlorothiazide (HYDRODIURIL) 25 MG tablet; Take 1 tablet (25 mg total) by mouth daily. For high blood pressure  Dispense: 90 tablet; Refill: 1 - lisinopril (PRINIVIL,ZESTRIL) 40 MG tablet; Take 1 tablet (40 mg total) by mouth daily. For high blood pressure  Dispense: 90 tablet; Refill: 1 - potassium chloride SA (K-DUR,KLOR-CON) 20 MEQ tablet; Take 1 tablet (20 mEq total) by mouth daily.  Dispense: 90 tablet; Refill: 1  2. B12 deficiency -cont with supplement, will follow up CBC with Differential/Platelets  3. Type 2 diabetes mellitus with chronic kidney disease, without  long-term current use of insulin, unspecified CKD stage ([place]) -discussed compliance with medication, refills provided  -cont lifestyle modifications  - glipiZIDE (GLUCOTROL) 5 MG tablet; Take 1 tablet (5 mg total) by mouth daily before breakfast. For diabetes  Dispense: 90 tablet; Refill: 1 - metFORMIN (GLUCOPHAGE) 500 MG tablet; TAKE 1 TABLET BY MOUTH 2 TIMES A DAY WITH A MEAL for diabetes  Dispense: 180 tablet; Refill: 1 - COMPLETE METABOLIC PANEL WITH GFR - Hemoglobin A1c  4. Primary osteoarthritis of both knees -controlled on ultram  - traMADol (ULTRAM) 50 MG tablet; Take 1 tablet (50 mg total) by mouth every 8 (eight) hours as needed. for pain  Dispense: 90 tablet; Refill: 1  5. Morbid obesity due to excess calories ([place]) Discussed weight loss, need to modify diet and increase exercise   6. Hyperlipidemia with target LDL less than 100 -cont medication with lifestyle modifications  - atorvastatin (LIPITOR) 10 MG tablet; Take one tablet by mouth once daily for cholesterol  Dispense: 90 tablet; Refill: 3  To follow up in 3-4 weeks on blood pressure  Michelene Keniston K. Harle Battiest  [facility] [phone] 8 am - 5 pm) [phone] (after hours)

## 2015-10-01 LAB — HEMOGLOBIN A1C
Hgb A1c MFr Bld: 8.8 % — ABNORMAL HIGH (ref <5.7)
Mean Plasma Glucose: 206 mg/dL

## 2015-10-04 NOTE — Addendum Note (Signed)
Addended by: SIMPSON, MESHELL A on: 10/04/2015 03:53 PM   Modules accepted: Orders  

## 2015-10-09 ENCOUNTER — Telehealth: Payer: Self-pay

## 2015-10-09 DIAGNOSIS — Z1159 Encounter for screening for other viral diseases: Secondary | ICD-10-CM

## 2015-10-09 NOTE — Telephone Encounter (Signed)
Patient called requesting Hep C lab order. Patient would like lab order added to his pending lab appointment for 11/01/15.  Please advise if ok to add lab order

## 2015-10-09 NOTE — Telephone Encounter (Signed)
This is ok

## 2015-10-24 ENCOUNTER — Ambulatory Visit (INDEPENDENT_AMBULATORY_CARE_PROVIDER_SITE_OTHER): Payer: Medicare Other | Admitting: Nurse Practitioner

## 2015-10-24 ENCOUNTER — Other Ambulatory Visit: Payer: Self-pay | Admitting: Nurse Practitioner

## 2015-10-24 ENCOUNTER — Encounter: Payer: Self-pay | Admitting: Nurse Practitioner

## 2015-10-24 VITALS — BP 142/82 | HR 75 | Temp 97.8°F | Resp 18 | Ht 71.0 in | Wt 333.2 lb

## 2015-10-24 DIAGNOSIS — Z23 Encounter for immunization: Secondary | ICD-10-CM

## 2015-10-24 DIAGNOSIS — E1142 Type 2 diabetes mellitus with diabetic polyneuropathy: Secondary | ICD-10-CM

## 2015-10-24 DIAGNOSIS — E1122 Type 2 diabetes mellitus with diabetic chronic kidney disease: Secondary | ICD-10-CM | POA: Diagnosis not present

## 2015-10-24 DIAGNOSIS — I10 Essential (primary) hypertension: Secondary | ICD-10-CM

## 2015-10-24 MED ORDER — HYDRALAZINE HCL 10 MG PO TABS: 10.0000 mg | ORAL_TABLET | Freq: Three times a day (TID) | ORAL | 1 refills | Status: DC

## 2015-10-24 MED FILL — hydrALAZINE HCL 10 MG TABS: 10 | 30 days supply | Qty: 90 | Fill #1

## 2015-10-24 NOTE — Progress Notes (Signed)
Careteam: Patient Care Team: [name], NP as PCP - General (Nurse Practitioner)  Advanced Directive information Does patient have an advance directive?: No, Would patient like information on creating an advanced directive?: Yes - Educational materials given  No Known Allergies  Chief Complaint  Patient presents with  . Medical Management of Chronic Issues    Follow up on blood pressure; wants flu vaccine  . Other    wife, Pamala Hurry, in room     HPI: Patient is a 65 y.o. male seen in the office today to follow up on blood pressure. At last visit he was out of all medications and blood pressure was elevated. Reported hydralazine was too expensive for him to get.  Lisinopril,  Coreg, Norvasc, HCTZ with potassium were all reordered at that time. Pt has taken all of his medication today. Reported the hydralazine 10 mg was too expensive before.  Taking jardiance, glipizide and metformin for diabetes- A1c was much worse at last check but pt was out of medication.  Review of Systems:  Review of Systems  Constitutional: Negative for activity change, chills and fatigue.  HENT: Negative for sore throat and trouble swallowing.   Eyes: Negative for visual disturbance.  Respiratory: Negative for cough, chest tightness and shortness of breath.   Cardiovascular: Negative for chest pain, palpitations and leg swelling.  Gastrointestinal: Negative for abdominal pain, constipation and diarrhea.  Genitourinary: Negative for difficulty urinating, frequency and urgency.  Musculoskeletal: Positive for arthralgias. Negative for joint swelling.  Skin: Negative for rash.  Neurological: Negative for weakness and headaches.  Psychiatric/Behavioral: Negative for confusion and sleep disturbance. The patient is not nervous/anxious.     Past Medical History:  Diagnosis Date  . Arthritis   . Cancer [facility]) 2010   Prostate  . Chronic kidney disease   . Diabetes mellitus   . Hypertension    Past  Surgical History:  Procedure Laterality Date  . HERNIA REPAIR    . KNEE SURGERY    . PROSTATE SURGERY    . RADIOACTIVE SEED IMPLANT    . SHOULDER SURGERY    . uretha surgery-2014     Social History:   reports that he has never smoked. He has never used smokeless tobacco. He reports that he does not drink alcohol or use drugs.  History reviewed. No pertinent family history.  Medications: Patient's Medications  New Prescriptions   HYDRALAZINE (APRESOLINE) 10 MG TABLET    Take 1 tablet (10 mg total) by mouth 3 (three) times daily.  Previous Medications   AMLODIPINE (NORVASC) 10 MG TABLET    Take 1 tablet (10 mg total) by mouth daily. For high blood pressure   ASPIRIN 81 MG TABLET    Take 81 mg by mouth daily.   ATORVASTATIN (LIPITOR) 10 MG TABLET    Take one tablet by mouth once daily for cholesterol   CARVEDILOL (COREG) 6.25 MG TABLET    Take 1 tablet (6.25 mg total) by mouth 2 (two) times daily with a meal. For high blood pressure   GLIPIZIDE (GLUCOTROL) 5 MG TABLET    Take 1 tablet (5 mg total) by mouth daily before breakfast. For diabetes   GLUCOSE BLOOD (TRUE METRIX BLOOD GLUCOSE TEST) TEST STRIP    Use to check blood sugar twice a day, (DX code  E11.42)   HYDROCHLOROTHIAZIDE (HYDRODIURIL) 25 MG TABLET    Take 1 tablet (25 mg total) by mouth daily. For high blood pressure   JARDIANCE 10 MG TABS  TABLET    TAKE 1 TABLET BY MOUTH ONCE DAILY   LISINOPRIL (PRINIVIL,ZESTRIL) 40 MG TABLET    Take 1 tablet (40 mg total) by mouth daily. For high blood pressure   METFORMIN (GLUCOPHAGE) 500 MG TABLET    TAKE 1 TABLET BY MOUTH 2 TIMES A DAY WITH A MEAL for diabetes   OMEPRAZOLE (PRILOSEC) 20 MG CAPSULE    Take 1 capsule (20 mg total) by mouth as needed.   POLY-IRON 150 150 MG CAPSULE    TAKE 1 CAPSULE BY MOUTH 2 TIMES DAILY.   POTASSIUM CHLORIDE SA (K-DUR,KLOR-CON) 20 MEQ TABLET    Take 1 tablet (20 mEq total) by mouth daily.   TIZANIDINE (ZANAFLEX) 4 MG CAPSULE    Take 1 capsule (4 mg total)  by mouth 3 (three) times daily as needed for muscle spasms.   TRAMADOL (ULTRAM) 50 MG TABLET    Take 1 tablet (50 mg total) by mouth every 8 (eight) hours as needed. for pain   TRUEPLUS LANCETS 30G MISC    USE TO TEST BLOOD SUGAR 2 TIMES A DAY   VITAMIN B-12 (CYANOCOBALAMIN) 1000 MCG TABLET    Take 1,000 mcg by mouth daily.  Modified Medications   No medications on file  Discontinued Medications   No medications on file     Physical Exam:  Vitals:   10/24/15 1009  BP: (!) 142/82  Pulse: 75  Resp: 18  Temp: 97.8 F (36.6 C)  TempSrc: Oral  SpO2: 94%  Weight: (!) 333 lb 3.2 oz (151.1 kg)  Height: 5\' 11"  (1.803 m)   Body mass index is 46.47 kg/m.  Physical Exam  Constitutional: He is oriented to person, place, and time. He appears well-developed and well-nourished.  Eyes: Pupils are equal, round, and reactive to light.  Neck: Neck supple. Carotid bruit is not present.  Cardiovascular: Normal rate, regular rhythm, normal heart sounds and intact distal pulses.   Pulmonary/Chest: Effort normal and breath sounds normal. No respiratory distress.  Abdominal: Soft. Bowel sounds are normal. He exhibits no distension, no abdominal bruit and no pulsatile midline mass. There is no tenderness.  Musculoskeletal: He exhibits no edema.  Neurological: He is alert and oriented to person, place, and time. No sensory deficit.  Skin: Skin is warm and dry.  Psychiatric: He has a normal mood and affect.    Labs reviewed: Basic Metabolic Panel:  Recent Labs  04/08/15 0907 06/28/15 0942 09/30/15 0952  NA 143 147* 140  K 3.5 4.2 4.1  CL 100 103 97*  CO2 25 28 32*  GLUCOSE 167* 196* 380*  BUN 14 14 20   CREATININE 0.93 0.97 1.18  CALCIUM 8.5* 8.9 9.3   Liver Function Tests:  Recent Labs  04/08/15 0907 06/28/15 0942 09/30/15 0952  AST 11 13 10   ALT 10 10 12   ALKPHOS 73 74 98  BILITOT 0.5 0.5 0.6  PROT 6.8 7.1 7.0  ALBUMIN 3.9 4.1 3.7   No results for input(s): LIPASE, AMYLASE  in the last 8760 hours. No results for input(s): AMMONIA in the last 8760 hours. CBC:  Recent Labs  03/08/15 0834 09/30/15 0952  WBC 7.4 7.8  NEUTROABS 4.7 5,616  HGB  --  13.6  HCT 38.7 41.8  MCV 83 83.4  PLT 287 263   Lipid Panel:  Recent Labs  03/08/15 0834  CHOL 153  HDL 52  LDLCALC 87  TRIG 70  CHOLHDL 2.9   TSH: No results for input(s): TSH in the  last 8760 hours. A1C: Lab Results  Component Value Date   HGBA1C 8.8 (H) 09/30/2015     Assessment/Plan 1. Essential hypertension -still not controlled -to cont on coreg, lisinopril, HTCZ and norvasc -cont diet modifications as well as exercise -will restart hydrALAZINE (APRESOLINE) 10 MG tablet; Take 1 tablet (10 mg total) by mouth 3 (three) times daily.  Dispense: 90 tablet; Refill: 1  2. Type 2 diabetes mellitus with chronic kidney disease, without long-term current use of insulin, unspecified CKD stage (HCC) -A1c much worse, did not bring blood sugars today -to take blood sugars before breakfast and bring to follow up with [facility].  Can not afford Jardiance as it is 400$ after insurance, #30 samples given -cont lifestyle modifications and medication   Jessica K. Harle Battiest  [facility] [phone] 8 am - 5 pm) [phone] (after hours)

## 2015-10-24 NOTE — Patient Instructions (Addendum)
REstart hydralazine 10 mg three times daily  Exercise 30 mins/5 days a week Diet modifications   Take fasting blood sugars-- bring things to next follow up with [facility]   DASH Eating Plan DASH stands for "Dietary Approaches to Stop Hypertension." The DASH eating plan is a healthy eating plan that has been shown to reduce high blood pressure (hypertension). Additional health benefits may include reducing the risk of type 2 diabetes mellitus, heart disease, and stroke. The DASH eating plan may also help with weight loss. WHAT DO I NEED TO KNOW ABOUT THE DASH EATING PLAN? For the DASH eating plan, you will follow these general guidelines:  Choose foods with a percent daily value for sodium of less than 5% (as listed on the food label).  Use salt-free seasonings or herbs instead of table salt or sea salt.  Check with your health care provider or pharmacist before using salt substitutes.  Eat lower-sodium products, often labeled as "lower sodium" or "no salt added."  Eat fresh foods.  Eat more vegetables, fruits, and low-fat dairy products.  Choose whole grains. Look for the word "whole" as the first word in the ingredient list.  Choose fish and skinless chicken or [place] more often than red meat. Limit fish, poultry, and meat to 6 oz (170 g) each day.  Limit sweets, desserts, sugars, and sugary drinks.  Choose heart-healthy fats.  Limit cheese to 1 oz (28 g) per day.  Eat more home-cooked food and less restaurant, buffet, and fast food.  Limit fried foods.  Cook foods using methods other than frying.  Limit canned vegetables. If you do use them, rinse them well to decrease the sodium.  When eating at a restaurant, ask that your food be prepared with less salt, or no salt if possible. WHAT FOODS CAN I EAT? Seek help from a dietitian for individual calorie needs. Grains Whole grain or whole wheat bread. Brown rice. Whole grain or whole wheat pasta. Quinoa, bulgur, and whole  grain cereals. Low-sodium cereals. Corn or whole wheat flour tortillas. Whole grain cornbread. Whole grain crackers. Low-sodium crackers. Vegetables Fresh or frozen vegetables (raw, steamed, roasted, or grilled). Low-sodium or reduced-sodium tomato and vegetable juices. Low-sodium or reduced-sodium tomato sauce and paste. Low-sodium or reduced-sodium canned vegetables.  Fruits All fresh, canned (in natural juice), or frozen fruits. Meat and Other Protein Products Ground beef (85% or leaner), grass-fed beef, or beef trimmed of fat. Skinless chicken or [place]. Ground chicken or [place]. Pork trimmed of fat. All fish and seafood. Eggs. Dried beans, peas, or lentils. Unsalted nuts and seeds. Unsalted canned beans. Dairy Low-fat dairy products, such as skim or 1% milk, 2% or reduced-fat cheeses, low-fat ricotta or cottage cheese, or plain low-fat yogurt. Low-sodium or reduced-sodium cheeses. Fats and Oils Tub margarines without trans fats. Light or reduced-fat mayonnaise and salad dressings (reduced sodium). Avocado. Safflower, olive, or canola oils. Natural peanut or almond butter. Other Unsalted popcorn and pretzels. The items listed above may not be a complete list of recommended foods or beverages. Contact your dietitian for more options. WHAT FOODS ARE NOT RECOMMENDED? Grains White bread. White pasta. White rice. Refined cornbread. Bagels and croissants. Crackers that contain trans fat. Vegetables Creamed or fried vegetables. Vegetables in a cheese sauce. Regular canned vegetables. Regular canned tomato sauce and paste. Regular tomato and vegetable juices. Fruits Dried fruits. Canned fruit in light or heavy syrup. Fruit juice. Meat and Other Protein Products Fatty cuts of meat. Ribs, chicken wings, bacon, sausage, bologna, salami,  chitterlings, fatback, hot dogs, bratwurst, and packaged luncheon meats. Salted nuts and seeds. Canned beans with salt. Dairy Whole or 2% milk, cream, half-and-half,  and cream cheese. Whole-fat or sweetened yogurt. Full-fat cheeses or blue cheese. Nondairy creamers and whipped toppings. Processed cheese, cheese spreads, or cheese curds. Condiments Onion and garlic salt, seasoned salt, table salt, and sea salt. Canned and packaged gravies. Worcestershire sauce. Tartar sauce. Barbecue sauce. Teriyaki sauce. Soy sauce, including reduced sodium. Steak sauce. Fish sauce. Oyster sauce. Cocktail sauce. Horseradish. Ketchup and mustard. Meat flavorings and tenderizers. Bouillon cubes. Hot sauce. Tabasco sauce. Marinades. Taco seasonings. Relishes. Fats and Oils Butter, stick margarine, lard, shortening, ghee, and bacon fat. Coconut, palm kernel, or palm oils. Regular salad dressings. Other Pickles and olives. Salted popcorn and pretzels. The items listed above may not be a complete list of foods and beverages to avoid. Contact your dietitian for more information. WHERE CAN I FIND MORE INFORMATION? National Heart, Lung, and Blood Institute: travelstabloid.com   This information is not intended to replace advice given to you by your health care provider. Make sure you discuss any questions you have with your health care provider.   Document Released: 01/22/2011 Document Revised: 02/23/2014 Document Reviewed: 12/07/2012 Elsevier Interactive Patient Education [employer].

## 2015-11-01 ENCOUNTER — Other Ambulatory Visit: Payer: Medicare Other

## 2015-11-01 DIAGNOSIS — Z1159 Encounter for screening for other viral diseases: Secondary | ICD-10-CM

## 2015-11-01 DIAGNOSIS — E1142 Type 2 diabetes mellitus with diabetic polyneuropathy: Secondary | ICD-10-CM | POA: Diagnosis not present

## 2015-11-01 LAB — COMPREHENSIVE METABOLIC PANEL
ALT: 10 U/L (ref 9–46)
AST: 13 U/L (ref 10–35)
Albumin: 3.9 g/dL (ref 3.6–5.1)
Alkaline Phosphatase: 70 U/L (ref 40–115)
BUN: 24 mg/dL (ref 7–25)
CO2: 27 mmol/L (ref 20–31)
Calcium: 8.7 mg/dL (ref 8.6–10.3)
Chloride: 101 mmol/L (ref 98–110)
Creat: 1.49 mg/dL — ABNORMAL HIGH (ref 0.70–1.25)
Glucose, Bld: 292 mg/dL — ABNORMAL HIGH (ref 65–99)
Potassium: 3.4 mmol/L — ABNORMAL LOW (ref 3.5–5.3)
Sodium: 140 mmol/L (ref 135–146)
Total Bilirubin: 0.5 mg/dL (ref 0.2–1.2)
Total Protein: 7.2 g/dL (ref 6.1–8.1)

## 2015-11-02 LAB — HEPATITIS C ANTIBODY: HCV Ab: NEGATIVE

## 2015-11-04 ENCOUNTER — Ambulatory Visit
Admission: RE | Admit: 2015-11-04 | Discharge: 2015-11-04 | Disposition: A | Discharge status: Home / Self Care | Payer: Medicare Other | Source: Ambulatory Visit | Attending: Nurse Practitioner | Admitting: Nurse Practitioner

## 2015-11-04 ENCOUNTER — Ambulatory Visit: Payer: Medicare Other | Admitting: Pharmacotherapy

## 2015-11-04 ENCOUNTER — Encounter: Payer: Self-pay | Admitting: Pharmacotherapy

## 2015-11-04 ENCOUNTER — Ambulatory Visit (INDEPENDENT_AMBULATORY_CARE_PROVIDER_SITE_OTHER): Payer: Medicare Other | Admitting: Nurse Practitioner

## 2015-11-04 ENCOUNTER — Encounter: Payer: Self-pay | Admitting: Nurse Practitioner

## 2015-11-04 VITALS — BP 152/84 | HR 79 | Temp 98.1°F | Resp 14 | Ht 71.0 in | Wt 332.3 lb

## 2015-11-04 DIAGNOSIS — R05 Cough: Secondary | ICD-10-CM | POA: Diagnosis not present

## 2015-11-04 DIAGNOSIS — J209 Acute bronchitis, unspecified: Secondary | ICD-10-CM

## 2015-11-04 MED ORDER — AMLODIPINE BESYLATE 10 MG PO TABS: 10.0000 mg | ORAL_TABLET | Freq: Every day | ORAL | 2 refills | Status: DC

## 2015-11-04 MED ORDER — CARVEDILOL 6.25 MG PO TABS: 6.2500 mg | ORAL_TABLET | Freq: Two times a day (BID) | ORAL | 2 refills | Status: DC

## 2015-11-04 MED ORDER — DOXYCYCLINE HYCLATE 100 MG PO TABS: 100.0000 mg | ORAL_TABLET | Freq: Two times a day (BID) | ORAL | 0 refills | Status: DC

## 2015-11-04 MED FILL — AMLODIPINE BESYLATE 10 MG T: 10 | 90 days supply | Qty: 90 | Fill #0

## 2015-11-04 MED FILL — CARVEDILOL 6.25 MG TABLET: 6.25 | 90 days supply | Qty: 180 | Fill #0

## 2015-11-04 MED FILL — DOXYCYCLINE HYCLATE 100 MG: 100 | 10 days supply | Qty: 20 | Fill #0

## 2015-11-04 NOTE — Patient Instructions (Signed)
Mucinex DM 1 tablet twice daily with full glass of water for cough and congestion for 1 week then as needed Doxycycline 100 mg twice daily for bronchitis     Acute Bronchitis Bronchitis is inflammation of the airways that extend from the windpipe into the lungs (bronchi). The inflammation often causes mucus to develop. This leads to a cough, which is the most common symptom of bronchitis.  In acute bronchitis, the condition usually develops suddenly and goes away over time, usually in a couple weeks. Smoking, allergies, and asthma can make bronchitis worse. Repeated episodes of bronchitis may cause further lung problems.  CAUSES Acute bronchitis is most often caused by the same virus that causes a cold. The virus can spread from person to person (contagious) through coughing, sneezing, and touching contaminated objects. SIGNS AND SYMPTOMS   Cough.   Fever.   Coughing up mucus.   Body aches.   Chest congestion.   Chills.   Shortness of breath.   Sore throat.  DIAGNOSIS  Acute bronchitis is usually diagnosed through a physical exam. Your health care provider will also ask you questions about your medical history. Tests, such as chest X-rays, are sometimes done to rule out other conditions.  TREATMENT  Acute bronchitis usually goes away in a couple weeks. Oftentimes, no medical treatment is necessary. Medicines are sometimes given for relief of fever or cough. Antibiotic medicines are usually not needed but may be prescribed in certain situations. In some cases, an inhaler may be recommended to help reduce shortness of breath and control the cough. A cool mist vaporizer may also be used to help thin bronchial secretions and make it easier to clear the chest.  HOME CARE INSTRUCTIONS  Get plenty of rest.   Drink enough fluids to keep your urine clear or pale yellow (unless you have a medical condition that requires fluid restriction). Increasing fluids may help thin your  respiratory secretions (sputum) and reduce chest congestion, and it will prevent dehydration.   Take medicines only as directed by your health care provider.  If you were prescribed an antibiotic medicine, finish it all even if you start to feel better.  Avoid smoking and secondhand smoke. Exposure to cigarette smoke or irritating chemicals will make bronchitis worse. If you are a smoker, consider using nicotine gum or skin patches to help control withdrawal symptoms. Quitting smoking will help your lungs heal faster.   Reduce the chances of another bout of acute bronchitis by washing your hands frequently, avoiding people with cold symptoms, and trying not to touch your hands to your mouth, nose, or eyes.   Keep all follow-up visits as directed by your health care provider.  SEEK MEDICAL CARE IF: Your symptoms do not improve after 1 week of treatment.  SEEK IMMEDIATE MEDICAL CARE IF:  You develop an increased fever or chills.   You have chest pain.   You have severe shortness of breath.  You have bloody sputum.   You develop dehydration.  You faint or repeatedly feel like you are going to pass out.  You develop repeated vomiting.  You develop a severe headache. MAKE SURE YOU:   Understand these instructions.  Will watch your condition.  Will get help right away if you are not doing well or get worse.   This information is not intended to replace advice given to you by your health care provider. Make sure you discuss any questions you have with your health care provider.   Document Released: 03/12/2004  Document Revised: 02/23/2014 Document Reviewed: 07/26/2012 Elsevier Interactive Patient Education [employer].

## 2015-11-04 NOTE — Progress Notes (Signed)
Careteam: Patient Care Team: [name], NP as PCP - General (Nurse Practitioner)  Advanced Directive information Does patient have an advance directive?: No, Would patient like information on creating an advanced directive?: Yes - Educational materials given  No Known Allergies  Chief Complaint  Patient presents with  . Acute Visit    SOB, flu like symptoms(chest congestion with yellow mucus, fatigue,      HPI: Patient is a 65 y.o. male seen in the office today due to not feeling well.  Has been having chest congestion with yellow mucous for about 8 days.  No fever that he is aware of. Possible low grade, body was sore (however using lower body more to get out of his chair)  Cough has been getting worse 4 days ago, yesterday was much worse. Shortness of breath with minimal activity, which is new Had to have breathing treatment last night which helped, otherwise he was going to go to the ED.  His mother in law is on breathing treatments -albuterol.  Has also tired delsym.  Pt without hx of COPD. Non smoker Review of Systems:  Review of Systems  Constitutional: Positive for chills. Negative for activity change, appetite change, diaphoresis, fatigue and fever.  HENT: Positive for congestion and postnasal drip. Negative for mouth sores, rhinorrhea, sinus pressure, sneezing, sore throat and trouble swallowing.   Eyes: Negative for visual disturbance.  Respiratory: Positive for cough, chest tightness and shortness of breath. Negative for wheezing.   Cardiovascular: Negative for chest pain, palpitations and leg swelling.  Genitourinary: Negative for difficulty urinating, frequency and urgency.  Skin: Negative for rash.  Neurological: Negative for weakness and headaches.  Psychiatric/Behavioral: Negative for confusion and sleep disturbance. The patient is not nervous/anxious.     Past Medical History:  Diagnosis Date  . Arthritis   . Cancer [facility]) 2010   Prostate  .  Chronic kidney disease   . Diabetes mellitus   . Hypertension    Past Surgical History:  Procedure Laterality Date  . HERNIA REPAIR    . KNEE SURGERY    . PROSTATE SURGERY    . RADIOACTIVE SEED IMPLANT    . SHOULDER SURGERY    . uretha surgery-2014     Social History:   reports that he has never smoked. He has never used smokeless tobacco. He reports that he does not drink alcohol or use drugs.  History reviewed. No pertinent family history.  Medications: Patient's Medications  New Prescriptions   No medications on file  Previous Medications   AMLODIPINE (NORVASC) 10 MG TABLET    Take 1 tablet (10 mg total) by mouth daily. For high blood pressure   ASPIRIN 81 MG TABLET    Take 81 mg by mouth daily.   ATORVASTATIN (LIPITOR) 10 MG TABLET    Take one tablet by mouth once daily for cholesterol   CARVEDILOL (COREG) 6.25 MG TABLET    Take 1 tablet (6.25 mg total) by mouth 2 (two) times daily with a meal. For high blood pressure   GLIPIZIDE (GLUCOTROL) 5 MG TABLET    Take 1 tablet (5 mg total) by mouth daily before breakfast. For diabetes   HYDRALAZINE (APRESOLINE) 10 MG TABLET    Take 1 tablet (10 mg total) by mouth 3 (three) times daily.   HYDROCHLOROTHIAZIDE (HYDRODIURIL) 25 MG TABLET    Take 1 tablet (25 mg total) by mouth daily. For high blood pressure   JARDIANCE 10 MG TABS TABLET  TAKE 1 TABLET BY MOUTH ONCE DAILY   LISINOPRIL (PRINIVIL,ZESTRIL) 40 MG TABLET    Take 1 tablet (40 mg total) by mouth daily. For high blood pressure   METFORMIN (GLUCOPHAGE) 500 MG TABLET    TAKE 1 TABLET BY MOUTH 2 TIMES A DAY WITH A MEAL for diabetes   OMEPRAZOLE (PRILOSEC) 20 MG CAPSULE    Take 1 capsule (20 mg total) by mouth as needed.   POLY-IRON 150 150 MG CAPSULE    TAKE 1 CAPSULE BY MOUTH 2 TIMES DAILY.   POTASSIUM CHLORIDE SA (K-DUR,KLOR-CON) 20 MEQ TABLET    Take 1 tablet (20 mEq total) by mouth daily.   TRAMADOL (ULTRAM) 50 MG TABLET    Take 1 tablet (50 mg total) by mouth every 8 (eight)  hours as needed. for pain   TRUE METRIX BLOOD GLUCOSE TEST TEST STRIP    CHECK BLOOD SUGAR TWICE PER DAY   TRUEPLUS LANCETS 30G MISC    USE TO TEST BLOOD SUGAR 2 TIMES A DAY   VITAMIN B-12 (CYANOCOBALAMIN) 1000 MCG TABLET    Take 1,000 mcg by mouth daily.  Modified Medications   No medications on file  Discontinued Medications   TIZANIDINE (ZANAFLEX) 4 MG CAPSULE    Take 1 capsule (4 mg total) by mouth 3 (three) times daily as needed for muscle spasms.     Physical Exam:  Vitals:   11/04/15 1003  BP: (!) 152/84  Pulse: 79  Resp: 14  Temp: 98.1 F (36.7 C)  TempSrc: Oral  SpO2: (!) 89%  Weight: (!) 332 lb 4.8 oz (150.7 kg)  Height: 5\' 11"  (1.803 m)   Body mass index is 46.35 kg/m.  Physical Exam  Constitutional: He is oriented to person, place, and time. He appears well-developed and well-nourished.  HENT:  Head: Normocephalic and atraumatic.  Right Ear: External ear normal.  Left Ear: External ear normal.  Nose: Nose normal.  Mouth/Throat: Oropharynx is clear and moist. No oropharyngeal exudate.  Eyes: Conjunctivae are normal. Pupils are equal, round, and reactive to light.  Neck: Neck supple. Carotid bruit is not present.  Cardiovascular: Normal rate, regular rhythm, normal heart sounds and intact distal pulses.   Pulmonary/Chest: Effort normal and breath sounds normal. No respiratory distress. He has no wheezes. He has no rales. He exhibits no tenderness.  Abdominal: He exhibits no abdominal bruit and no pulsatile midline mass.  Musculoskeletal: He exhibits no edema.  Neurological: He is alert and oriented to person, place, and time. No sensory deficit.  Skin: Skin is warm and dry.  Psychiatric: He has a normal mood and affect.   Labs reviewed: Basic Metabolic Panel:  Recent Labs  06/28/15 0942 09/30/15 0952 11/01/15 0936  NA 147* 140 140  K 4.2 4.1 3.4*  CL 103 97* 101  CO2 28 32* 27  GLUCOSE 196* 380* 292*  BUN 14 20 24   CREATININE 0.97 1.18 1.49*    CALCIUM 8.9 9.3 8.7   Liver Function Tests:  Recent Labs  06/28/15 0942 09/30/15 0952 11/01/15 0936  AST 13 10 13   ALT 10 12 10   ALKPHOS 74 98 70  BILITOT 0.5 0.6 0.5  PROT 7.1 7.0 7.2  ALBUMIN 4.1 3.7 3.9   No results for input(s): LIPASE, AMYLASE in the last 8760 hours. No results for input(s): AMMONIA in the last 8760 hours. CBC:  Recent Labs  03/08/15 0834 09/30/15 0952  WBC 7.4 7.8  NEUTROABS 4.7 5,616  HGB  --  13.6  HCT 38.7 41.8  MCV 83 83.4  PLT 287 263   Lipid Panel:  Recent Labs  03/08/15 0834  CHOL 153  HDL 52  LDLCALC 87  TRIG 70  CHOLHDL 2.9   TSH: No results for input(s): TSH in the last 8760 hours. A1C: Lab Results  Component Value Date   HGBA1C 8.8 (H) 09/30/2015     Assessment/Plan 1. Acute bronchitis, unspecified organism -mucinex DM BID with full glass of water - DG Chest 2 View; Future r/o pneumonia - doxycycline (VIBRA-TABS) 100 MG tablet; Take 1 tablet (100 mg total) by mouth 2 (two) times daily.  Dispense: 20 tablet; Refill: 0 -discussed return precautions, pt agrees and understands   Janett Billow K. Harle Battiest  [facility] [phone] 8 am - 5 pm) [phone] (after hours)

## 2015-11-07 ENCOUNTER — Encounter (HOSPITAL_BASED_OUTPATIENT_CLINIC_OR_DEPARTMENT_OTHER): Payer: Self-pay | Admitting: *Deleted

## 2015-11-07 ENCOUNTER — Emergency Department (HOSPITAL_BASED_OUTPATIENT_CLINIC_OR_DEPARTMENT_OTHER)
Admission: EM | Admit: 2015-11-07 | Discharge: 2015-11-07 | Disposition: A | Discharge status: Home / Self Care | Payer: Medicare Other | Attending: Emergency Medicine | Admitting: Emergency Medicine

## 2015-11-07 DIAGNOSIS — Z7982 Long term (current) use of aspirin: Secondary | ICD-10-CM | POA: Insufficient documentation

## 2015-11-07 DIAGNOSIS — E1122 Type 2 diabetes mellitus with diabetic chronic kidney disease: Secondary | ICD-10-CM | POA: Diagnosis not present

## 2015-11-07 DIAGNOSIS — J069 Acute upper respiratory infection, unspecified: Secondary | ICD-10-CM | POA: Diagnosis not present

## 2015-11-07 DIAGNOSIS — Z79899 Other long term (current) drug therapy: Secondary | ICD-10-CM | POA: Insufficient documentation

## 2015-11-07 DIAGNOSIS — I129 Hypertensive chronic kidney disease with stage 1 through stage 4 chronic kidney disease, or unspecified chronic kidney disease: Secondary | ICD-10-CM | POA: Insufficient documentation

## 2015-11-07 DIAGNOSIS — N189 Chronic kidney disease, unspecified: Secondary | ICD-10-CM | POA: Insufficient documentation

## 2015-11-07 DIAGNOSIS — R05 Cough: Secondary | ICD-10-CM | POA: Diagnosis present

## 2015-11-07 DIAGNOSIS — Z8546 Personal history of malignant neoplasm of prostate: Secondary | ICD-10-CM | POA: Diagnosis not present

## 2015-11-07 DIAGNOSIS — B9789 Other viral agents as the cause of diseases classified elsewhere: Secondary | ICD-10-CM

## 2015-11-07 DIAGNOSIS — Z7984 Long term (current) use of oral hypoglycemic drugs: Secondary | ICD-10-CM | POA: Diagnosis not present

## 2015-11-07 LAB — URINE MICROSCOPIC-ADD ON

## 2015-11-07 LAB — URINALYSIS, ROUTINE W REFLEX MICROSCOPIC
Bilirubin Urine: NEGATIVE
Glucose, UA: >1000 mg/dL — AB
Hgb urine dipstick: NEGATIVE
Ketones, ur: 15 mg/dL — AB
Nitrite: NEGATIVE
Protein, ur: 30 mg/dL — AB
Specific Gravity, Urine: 1.039 — ABNORMAL HIGH (ref 1.005–1.030)
pH: 6 (ref 5.0–8.0)

## 2015-11-07 MED ORDER — HYDROCODONE-HOMATROPINE 5-1.5 MG/5ML PO SYRP: 5.0000 mL | ORAL_SOLUTION | Freq: Four times a day (QID) | ORAL | 0 refills | Status: DC | PRN

## 2015-11-07 MED FILL — HYDROCODONE-HOMATROPINE SYR: 5-1.5 | 6 days supply | Qty: 120 | Fill #0

## 2015-11-07 NOTE — Discharge Instructions (Signed)
Continue taking your Doxycycline as directed

## 2015-11-07 NOTE — ED Triage Notes (Signed)
Pt reports productive cough x 1 week, "cough so hard I it gags me!" seen by his pcp on Monday, rx doxycycline for bronchitis and mucinex, cont with cough.

## 2015-11-07 NOTE — ED Provider Notes (Signed)
[facility] DEPT [facility] Provider Note   CSN: [id number] Arrival date & time: 11/07/15  1023     History   Chief Complaint Chief Complaint  Patient presents with  . Cough    HPI Thomas [name] is a 66 y.o. male.  Patient is a 65 year old male who presents today with complaints of trouble catching his breath when he is coughing hard. Patient was seen by PCP on Monday for a cough and had a chest x-ray that he was told was negative, but was put on 500 mg doxycycline BID and mucinex. Since then his only complaint is trouble catching breath when coughing very hard. He denies fever, chills, wheezing, shortness of breath, chest pain, or rhinorrhea. His wife and granddaughter both had similar coughs and were put on antibiotics and told they had pneumonia.  He is also concerned he might have a UTI, "I just feel like something is going on down there". Patient has a history of prostate cancer in 2008 and since 2010 has been using in-out catheters. For 3 weeks in August he was reusing old catheters because he couldn't get any new ones until 9/4.  He states he does have some blood in urine but occasionally has blood due to catheters. He denies cloudiness, abdominal pain, increased urgency or frequency.      Past Medical History:  Diagnosis Date  . Arthritis   . Cancer [facility]) 2010   Prostate  . Chronic kidney disease   . Diabetes mellitus   . Hypertension     Patient Active Problem List   Diagnosis Date Noted  . Type 2 diabetes mellitus with chronic kidney disease, without long-term current use of insulin ([employer]) 12/05/2014  . Anemia, iron deficiency 11/22/2013  . Severe obesity (BMI >= 40) ([place]) 05/23/2013  . B12 deficiency 05/23/2013  . Hyperlipidemia with target LDL less than 100 09/02/2012  . HTN (hypertension) 05/26/2012  . Osteoarthritis, knee 05/26/2012  . MORBID OBESITY 08/22/2008    Past Surgical History:  Procedure Laterality Date  . HERNIA REPAIR    . KNEE SURGERY      . PROSTATE SURGERY    . RADIOACTIVE SEED IMPLANT    . SHOULDER SURGERY    . uretha surgery-2014         Home Medications    Prior to Admission medications   Medication Sig Start Date End Date Taking? Authorizing Provider  amLODipine (NORVASC) 10 MG tablet Take 1 tablet (10 mg total) by mouth daily. For high blood pressure 11/04/15   [name], NP  aspirin 81 MG tablet Take 81 mg by mouth daily.    Historical Provider, MD  atorvastatin (LIPITOR) 10 MG tablet Take one tablet by mouth once daily for cholesterol 09/30/15   [name], NP  carvedilol (COREG) 6.25 MG tablet Take 1 tablet (6.25 mg total) by mouth 2 (two) times daily with a meal. For high blood pressure 11/04/15   [name], NP  doxycycline (VIBRA-TABS) 100 MG tablet Take 1 tablet (100 mg total) by mouth 2 (two) times daily. 11/04/15   [name], NP  glipiZIDE (GLUCOTROL) 5 MG tablet Take 1 tablet (5 mg total) by mouth daily before breakfast. For diabetes 09/30/15   [name], NP  hydrALAZINE (APRESOLINE) 10 MG tablet Take 1 tablet (10 mg total) by mouth 3 (three) times daily. 10/24/15   [name], NP  hydrochlorothiazide (HYDRODIURIL) 25 MG tablet Take 1 tablet (25 mg total) by mouth  daily. For high blood pressure 09/30/15   [name], NP  JARDIANCE 10 MG TABS tablet TAKE 1 TABLET BY MOUTH ONCE DAILY 03/28/15   Tiffany L Reed, DO  lisinopril (PRINIVIL,ZESTRIL) 40 MG tablet Take 1 tablet (40 mg total) by mouth daily. For high blood pressure 09/30/15   [name], NP  metFORMIN (GLUCOPHAGE) 500 MG tablet TAKE 1 TABLET BY MOUTH 2 TIMES A DAY WITH A MEAL for diabetes 09/30/15   [name], NP  omeprazole (PRILOSEC) 20 MG capsule Take 1 capsule (20 mg total) by mouth as needed. 05/24/14   [name], NP  POLY-IRON 150 150 MG capsule TAKE 1 CAPSULE BY MOUTH 2 TIMES DAILY. 03/28/15   Tiffany L Reed, DO  potassium chloride SA (K-DUR,KLOR-CON) 20 MEQ tablet Take 1 tablet (20 mEq  total) by mouth daily. 09/30/15   [name], NP  traMADol (ULTRAM) 50 MG tablet Take 1 tablet (50 mg total) by mouth every 8 (eight) hours as needed. for pain 09/30/15   [name], NP  TRUE METRIX BLOOD GLUCOSE TEST test strip CHECK BLOOD SUGAR TWICE PER DAY 10/24/15   [name], NP  TRUEPLUS LANCETS 30G MISC USE TO TEST BLOOD SUGAR 2 TIMES A DAY 09/12/14   [name], NP  vitamin B-12 (CYANOCOBALAMIN) 1000 MCG tablet Take 1,000 mcg by mouth daily.    Historical Provider, MD    Family History History reviewed. No pertinent family history.  Social History Social History  Substance Use Topics  . Smoking status: Never Smoker  . Smokeless tobacco: Never Used  . Alcohol use No     Comment: never     Allergies   Review of patient's allergies indicates no known allergies.   Review of Systems Review of Systems  All other systems reviewed and are negative.    Physical Exam Updated Vital Signs BP 141/78 (BP Location: Right Arm)   Pulse 70   Temp 98.6 F (37 C) (Oral)   Resp 18   SpO2 96%   Physical Exam  Constitutional: He appears well-developed and well-nourished.  HENT:  Head: Normocephalic and atraumatic.  Mouth/Throat: Oropharynx is clear and moist.  Neck: Normal range of motion. Neck supple.  Cardiovascular: Normal rate, regular rhythm and normal heart sounds.   Pulmonary/Chest: Effort normal and breath sounds normal. No respiratory distress. He has no wheezes. He has no rales.  Abdominal: Soft. Bowel sounds are normal. He exhibits no distension and no mass. There is no tenderness. There is no rebound and no guarding. No hernia.  Musculoskeletal: Normal range of motion.  Neurological: He is alert.  Skin: Skin is warm and dry.  Psychiatric: He has a normal mood and affect.     ED Treatments / Results  Labs (all labs ordered are listed, but only abnormal results are displayed) Labs Reviewed  URINALYSIS, ROUTINE W REFLEX MICROSCOPIC (NOT AT  [facility])    EKG  EKG Interpretation None       Radiology No results found.  Procedures Procedures (including critical care time)  Medications Ordered in ED Medications - No data to display   Initial Impression / Assessment and Plan / ED Course  I have reviewed the triage vital signs and the nursing notes.  Pertinent labs & imaging results that were available during my care of the patient were reviewed by me and considered in my medical decision making (see chart for details).  Clinical Course      Final  Clinical Impressions(s) / ED Diagnoses   Final diagnoses:  None   Patient presents today with complaint of cough.  He had a CXR done at the office of his PCP three days ago and was placed on Doxycycline BID although he reports that the CXR was negative.  He reports that he is taking the Doxycycline as directed.  He also is requesting to be checked for a UTI because he was reusing old catheters.  He is afebrile and does not have urinary symptoms.  UA not showing a clear infection.  He does preform in and out caths.  He is already on Doxycycline.  Urine cultured.  Patient stable for discharge.  Return precautions given.    New Prescriptions New Prescriptions   No medications on file     Hyman Bible, Hershal Coria 11/09/15 [place], MD 11/10/15 [id number]

## 2015-11-08 LAB — URINE CULTURE: Culture: 40000 — AB

## 2015-11-09 ENCOUNTER — Telehealth (HOSPITAL_BASED_OUTPATIENT_CLINIC_OR_DEPARTMENT_OTHER): Payer: Self-pay

## 2015-11-09 NOTE — Telephone Encounter (Signed)
Post ED Visit - Positive Culture Follow-up: Unsuccessful Patient Follow-up  Culture assessed and recommendations reviewed by: []  Elenor Quinones, Pharm.D. []  Heide Guile, Pharm.D., BCPS []  Parks Neptune, Pharm.D. []  Alycia Rossetti, Pharm.D., BCPS []  [place], Pharm.D., BCPS, AAHIVP []  Legrand Como, Pharm.D., BCPS, AAHIVP []  Milus Glazier, Pharm.D. []  [place] November, Pharm.D.  Shelly Rubenstein PharmD Positive urine culture  [x]  Patient discharged without antimicrobial prescription and treatment is now indicated []  Organism is resistant to prescribed ED discharge antimicrobial []  Patient with positive blood cultures   Unable to contact patient after 3 attempts, letter will be sent to address on file  Thomas [name] 11/09/2015, 12:00 PM

## 2015-11-09 NOTE — Progress Notes (Signed)
ED Antimicrobial Stewardship Positive Culture Follow Up   Thomas [name] is an 65 y.o. male who presented to [facility] on 11/07/2015 with a chief complaint of  Chief Complaint  Patient presents with  . Cough    Recent Results (from the past 720 hour(s))  Urine culture     Status: Abnormal   Collection Time: 11/07/15 11:10 AM  Result Value Ref Range Status   Specimen Description URINE, RANDOM  Final   Special Requests NONE  Final   Culture (A)  Final    40,000 COLONIES/mL GROUP B STREP(S.AGALACTIAE)ISOLATED TESTING AGAINST S. AGALACTIAE NOT ROUTINELY PERFORMED DUE TO PREDICTABILITY OF AMP/PEN/VAN SUSCEPTIBILITY. Performed at [facility]    Report Status 11/08/2015 FINAL  Final    []  Treated with N/A, organism resistant to prescribed antimicrobial [x]  Patient discharged originally without antimicrobial agent and treatment is now indicated  New antibiotic prescription: amoxicillin 500mg  PO BID x 7 days  ED Provider: Jeannett Senior, PA   [name], [name] 11/09/2015, 9:23 AM Clinical Pharmacist Phone# [phone]

## 2015-11-18 ENCOUNTER — Ambulatory Visit: Payer: Medicare Other | Admitting: Pharmacotherapy

## 2015-11-23 ENCOUNTER — Encounter (HOSPITAL_BASED_OUTPATIENT_CLINIC_OR_DEPARTMENT_OTHER): Payer: Self-pay | Admitting: Emergency Medicine

## 2015-11-23 ENCOUNTER — Emergency Department (HOSPITAL_BASED_OUTPATIENT_CLINIC_OR_DEPARTMENT_OTHER): Payer: Medicare Other

## 2015-11-23 ENCOUNTER — Inpatient Hospital Stay (HOSPITAL_BASED_OUTPATIENT_CLINIC_OR_DEPARTMENT_OTHER)
Admission: EM | Admit: 2015-11-23 | Discharge: 2015-11-26 | DRG: 176 | Disposition: A | Discharge status: Home / Self Care | Payer: Medicare Other | Attending: Internal Medicine | Admitting: Internal Medicine

## 2015-11-23 DIAGNOSIS — N139 Obstructive and reflux uropathy, unspecified: Secondary | ICD-10-CM | POA: Diagnosis present

## 2015-11-23 DIAGNOSIS — E876 Hypokalemia: Secondary | ICD-10-CM | POA: Diagnosis not present

## 2015-11-23 DIAGNOSIS — E1122 Type 2 diabetes mellitus with diabetic chronic kidney disease: Secondary | ICD-10-CM | POA: Diagnosis present

## 2015-11-23 DIAGNOSIS — I82412 Acute embolism and thrombosis of left femoral vein: Secondary | ICD-10-CM | POA: Diagnosis not present

## 2015-11-23 DIAGNOSIS — Z6841 Body Mass Index (BMI) 40.0 and over, adult: Secondary | ICD-10-CM | POA: Diagnosis not present

## 2015-11-23 DIAGNOSIS — K8020 Calculus of gallbladder without cholecystitis without obstruction: Secondary | ICD-10-CM | POA: Diagnosis present

## 2015-11-23 DIAGNOSIS — I129 Hypertensive chronic kidney disease with stage 1 through stage 4 chronic kidney disease, or unspecified chronic kidney disease: Secondary | ICD-10-CM | POA: Diagnosis present

## 2015-11-23 DIAGNOSIS — Z7984 Long term (current) use of oral hypoglycemic drugs: Secondary | ICD-10-CM

## 2015-11-23 DIAGNOSIS — R05 Cough: Secondary | ICD-10-CM | POA: Diagnosis not present

## 2015-11-23 DIAGNOSIS — Z7982 Long term (current) use of aspirin: Secondary | ICD-10-CM

## 2015-11-23 DIAGNOSIS — I2699 Other pulmonary embolism without acute cor pulmonale: Principal | ICD-10-CM | POA: Diagnosis present

## 2015-11-23 DIAGNOSIS — R0602 Shortness of breath: Secondary | ICD-10-CM | POA: Diagnosis not present

## 2015-11-23 DIAGNOSIS — Z8546 Personal history of malignant neoplasm of prostate: Secondary | ICD-10-CM | POA: Diagnosis not present

## 2015-11-23 DIAGNOSIS — N182 Chronic kidney disease, stage 2 (mild): Secondary | ICD-10-CM | POA: Diagnosis present

## 2015-11-23 DIAGNOSIS — D3502 Benign neoplasm of left adrenal gland: Secondary | ICD-10-CM | POA: Diagnosis present

## 2015-11-23 DIAGNOSIS — I10 Essential (primary) hypertension: Secondary | ICD-10-CM

## 2015-11-23 LAB — CBC WITH DIFFERENTIAL/PLATELET
Basophils Absolute: 0 10*3/uL (ref 0.0–0.1)
Basophils Relative: 0 %
Eosinophils Absolute: 0 10*3/uL (ref 0.0–0.7)
Eosinophils Relative: 0 %
HCT: 42.2 % (ref 39.0–52.0)
Hemoglobin: 13.5 g/dL (ref 13.0–17.0)
Lymphocytes Relative: 20 %
Lymphs Abs: 1.6 10*3/uL (ref 0.7–4.0)
MCH: 26.6 pg (ref 26.0–34.0)
MCHC: 32 g/dL (ref 30.0–36.0)
MCV: 83.1 fL (ref 78.0–100.0)
Monocytes Absolute: 0.6 10*3/uL (ref 0.1–1.0)
Monocytes Relative: 7 %
Neutro Abs: 5.6 10*3/uL (ref 1.7–7.7)
Neutrophils Relative %: 73 %
Platelets: 229 10*3/uL (ref 150–400)
RBC: 5.08 MIL/uL (ref 4.22–5.81)
RDW: 13.8 % (ref 11.5–15.5)
WBC: 7.7 10*3/uL (ref 4.0–10.5)

## 2015-11-23 LAB — BASIC METABOLIC PANEL
Anion gap: 9 (ref 5–15)
BUN: 19 mg/dL (ref 6–20)
CO2: 28 mmol/L (ref 22–32)
Calcium: 8.8 mg/dL — ABNORMAL LOW (ref 8.9–10.3)
Chloride: 101 mmol/L (ref 101–111)
Creatinine, Ser: 1.04 mg/dL (ref 0.61–1.24)
GFR calc Af Amer: >60 mL/min (ref >60)
GFR calc non Af Amer: >60 mL/min (ref >60)
Glucose, Bld: 352 mg/dL — ABNORMAL HIGH (ref 65–99)
Potassium: 3.2 mmol/L — ABNORMAL LOW (ref 3.5–5.1)
Sodium: 138 mmol/L (ref 135–145)

## 2015-11-23 MED ORDER — POTASSIUM CHLORIDE CRYS ER 20 MEQ PO TBCR
20.0000 meq | EXTENDED_RELEASE_TABLET | Freq: Every day | ORAL | Status: DC
  Administered 2015-11-24: 20 meq via ORAL
  Filled 2015-11-23: qty 1

## 2015-11-23 MED ORDER — HEPARIN (PORCINE) IN NACL 100-0.45 UNIT/ML-% IJ SOLN
1850.0000 [IU]/h | INTRAMUSCULAR | Status: DC
  Administered 2015-11-23: 1850 [IU]/h via INTRAVENOUS
  Filled 2015-11-23: qty 250

## 2015-11-23 MED ORDER — HYDROCHLOROTHIAZIDE 25 MG PO TABS
25.0000 mg | ORAL_TABLET | Freq: Every day | ORAL | Status: DC
Start: 2015-11-24 — End: 2015-11-26
  Administered 2015-11-24 – 2015-11-26 (×3): 25 mg via ORAL
  Filled 2015-11-23 (×3): qty 1

## 2015-11-23 MED ORDER — TRAMADOL HCL 50 MG PO TABS
50.0000 mg | ORAL_TABLET | Freq: Three times a day (TID) | ORAL | Status: DC | PRN
Start: 2015-11-23 — End: 2015-11-26

## 2015-11-23 MED ORDER — HEPARIN (PORCINE) IN NACL 100-0.45 UNIT/ML-% IJ SOLN
1750.0000 [IU]/h | INTRAMUSCULAR | Status: AC
  Administered 2015-11-23 – 2015-11-25 (×4): 1850 [IU]/h via INTRAVENOUS
  Administered 2015-11-26: 1750 [IU]/h via INTRAVENOUS
  Filled 2015-11-23 (×5): qty 250

## 2015-11-23 MED ORDER — HEPARIN BOLUS VIA INFUSION
6500.0000 [IU] | Freq: Once | INTRAVENOUS | Status: AC
  Administered 2015-11-23: 6500 [IU] via INTRAVENOUS
  Filled 2015-11-23: qty 6500

## 2015-11-23 MED ORDER — HYDROCODONE-HOMATROPINE 5-1.5 MG/5ML PO SYRP: 5.0000 mL | ORAL_SOLUTION | Freq: Four times a day (QID) | ORAL | Status: DC | PRN

## 2015-11-23 MED ORDER — ASPIRIN 81 MG PO CHEW
81.0000 mg | CHEWABLE_TABLET | Freq: Every day | ORAL | Status: DC
  Administered 2015-11-24 – 2015-11-26 (×3): 81 mg via ORAL
  Filled 2015-11-23 (×3): qty 1

## 2015-11-23 MED ORDER — INSULIN ASPART 100 UNIT/ML ~~LOC~~ SOLN
0.0000 [IU] | Freq: Three times a day (TID) | SUBCUTANEOUS | Status: DC
  Administered 2015-11-24: 5 [IU] via SUBCUTANEOUS
  Administered 2015-11-24: 7 [IU] via SUBCUTANEOUS

## 2015-11-23 MED ORDER — HYDRALAZINE HCL 10 MG PO TABS
10.0000 mg | ORAL_TABLET | Freq: Three times a day (TID) | ORAL | Status: DC
  Administered 2015-11-24 – 2015-11-26 (×8): 10 mg via ORAL
  Filled 2015-11-23 (×8): qty 1

## 2015-11-23 MED ORDER — IOPAMIDOL (ISOVUE-370) INJECTION 76%
100.0000 mL | Freq: Once | INTRAVENOUS | Status: AC | PRN
  Administered 2015-11-23: 100 mL via INTRAVENOUS

## 2015-11-23 MED ORDER — LISINOPRIL 40 MG PO TABS
40.0000 mg | ORAL_TABLET | Freq: Every day | ORAL | Status: DC
  Administered 2015-11-24 – 2015-11-26 (×3): 40 mg via ORAL
  Filled 2015-11-23 (×3): qty 1

## 2015-11-23 MED ORDER — HEPARIN BOLUS VIA INFUSION
6500.0000 [IU] | Freq: Once | INTRAVENOUS | Status: AC
  Administered 2015-11-23: 6500 [IU] via INTRAVENOUS

## 2015-11-23 MED ORDER — ATORVASTATIN CALCIUM 10 MG PO TABS
10.0000 mg | ORAL_TABLET | Freq: Every day | ORAL | Status: DC
  Administered 2015-11-24 – 2015-11-25 (×2): 10 mg via ORAL
  Filled 2015-11-23 (×2): qty 1

## 2015-11-23 MED ORDER — PANTOPRAZOLE SODIUM 40 MG PO TBEC
40.0000 mg | DELAYED_RELEASE_TABLET | Freq: Every day | ORAL | Status: DC
  Administered 2015-11-24 – 2015-11-26 (×3): 40 mg via ORAL
  Filled 2015-11-23 (×3): qty 1

## 2015-11-23 MED ORDER — CARVEDILOL 6.25 MG PO TABS
6.2500 mg | ORAL_TABLET | Freq: Two times a day (BID) | ORAL | Status: DC
  Administered 2015-11-24 – 2015-11-26 (×5): 6.25 mg via ORAL
  Filled 2015-11-23 (×5): qty 1

## 2015-11-23 MED ORDER — AMLODIPINE BESYLATE 10 MG PO TABS
10.0000 mg | ORAL_TABLET | Freq: Every day | ORAL | Status: DC
  Administered 2015-11-24 – 2015-11-26 (×3): 10 mg via ORAL
  Filled 2015-11-23 (×3): qty 1

## 2015-11-23 NOTE — Progress Notes (Signed)
ANTICOAGULATION CONSULT NOTE - Initial Consult  Pharmacy Consult for heparin Indication: pulmonary embolus  No Known Allergies  Patient Measurements: Height: 5\' 11"  (180.3 cm) Weight: (!) 330 lb (149.7 kg) IBW/kg (Calculated) : 75.3 Heparin Dosing Weight: 110 kg  Vital Signs: Temp: 98.3 F (36.8 C) (10/07 1121) Temp Source: Oral (10/07 1121) BP: 164/72 (10/07 1513) Pulse Rate: 62 (10/07 1513)  Labs:  Recent Labs  11/23/15 1205  HGB 13.5  HCT 42.2  PLT 229  CREATININE 1.04    Estimated Creatinine Clearance: 105.3 mL/min (by C-G formula based on SCr of 1.04 mg/dL).  Assessment: 65 yo m presenting with SOB, cough x 2 weeks - CTA shows PE in right lung  PMH: prostate ca, ckd, dm, htn  Anticoag: none pta. Iv hep for PE on CTA  Nephro: SCr 1.04   Heme/Onc: H&H 13.5/42.2, Plt 229  Goal of Therapy:  Heparin level 0.3-0.7 units/ml Monitor platelets by anticoagulation protocol: Yes   Plan:  Heparin bolus 6500 units x 1 Heparin infusion 1850 units/hr Initial HL 2100 Daily HL, CBC F/U Plans for [facility]  Levester Fresh, PharmD, BCPS, [facility] Clinical Pharmacist Pager [phone] 11/23/2015 3:16 PM

## 2015-11-23 NOTE — ED Triage Notes (Signed)
Pt reports sob, productive cough (yellow) x2 weeks.  Pt was placed on antibiotics two weeks ago for bronchitis.  Pt alert and oriented, speaking in full sentences, airway patent.

## 2015-11-23 NOTE — ED Notes (Signed)
Attempted to call report to floor. Secretary took name and number to give to nurse.

## 2015-11-23 NOTE — ED Notes (Addendum)
Heparin stopped per Pa Irena Cords until coag panel can be drawn. Per lab, labs should be drawn at [facility] because they would be more accurate done more expeditiously.  PA informed

## 2015-11-23 NOTE — ED Notes (Signed)
MD at bedside. 

## 2015-11-23 NOTE — H&P (Signed)
History and Physical  Thomas [name] [id number] DOB: Jun 08, 1950 DOA: 11/23/2015  PCP:  [name], NP   Chief Complaint:  Cough   History of Present Illness:  Patient is a 65 yo male with history of DMII, HTN, prostate cancer in remission who came with cc of constant cough for the past 4 weeks that did not respond to treatment for acute bronchitis 3 weeks ago when visited the ER. He did not have chest pain except with cough. He did complain of worsening dyspnea with any minimal exertion. His cough has also gotten worse. CTPE in the ED was positive for pulmonary embolism. Patient denied any history of DVT in the past. He has not had any recent long travel or surgery ( he travels frequently between [place]). He has noticed mild swelling in his left leg with occasional pain. Otherwise he has no complaints.   Review of Systems:  CONSTITUTIONAL:     No night sweats.  No fatigue.  No fever. No chills. Eyes:                            No visual changes.  No eye pain.  No eye discharge.   ENT:                              No epistaxis.  No sinus pain.  No sore throat.   No congestion. RESPIRATORY:           +cough.  No wheeze.  No hemoptysis.  +dyspnea CARDIOVASCULAR   :  No chest pains.  No palpitations. GASTROINTESTINAL:  No abdominal pain.  No nausea. No vomiting.  No diarrhea. No  constipation.  No hematemesis.  No hematochezia.  No melena. GENITOURINARY:      No urgency.  No frequency.  No dysuria.  No hematuria.  +obstructive symptoms.  No discharge.  No pain.   MUSCULOSKELETAL:  No musculoskeletal pain.  No joint swelling.  No arthritis. NEUROLOGICAL:        No confusion.  No weakness. No headache. No seizure. PSYCHIATRIC:             No depression. No anxiety. No suicidal ideation. SKIN:                             No rashes.  No lesions.  No wounds. ENDOCRINE:                No weight loss.  No polydipsia.  No polyuria.  No  polyphagia. HEMATOLOGIC:           No purpura.  No petechiae.  No bleeding.  ALLERGIC                 : No pruritus.  No angioedema Other:  Past Medical and Surgical History:   Past Medical History:  Diagnosis Date  . Arthritis   . Cancer [facility]) 2010   Prostate  . Chronic kidney disease   . Diabetes mellitus   . Hypertension    Past Surgical History:  Procedure Laterality Date  . HERNIA REPAIR    . KNEE SURGERY    . PROSTATE SURGERY    . RADIOACTIVE SEED IMPLANT    . SHOULDER SURGERY    . uretha surgery-2014      Social History:   reports that he  has never smoked. He has never used smokeless tobacco. He reports that he does not drink alcohol or use drugs.    No Known Allergies  FH: unaware of any hx of DVT in his family  Prior to Admission medications   Medication Sig Start Date End Date Taking? Authorizing Provider  amLODipine (NORVASC) 10 MG tablet Take 1 tablet (10 mg total) by mouth daily. For high blood pressure 11/04/15   [name], NP  aspirin 81 MG tablet Take 81 mg by mouth daily.    Historical Provider, MD  atorvastatin (LIPITOR) 10 MG tablet Take one tablet by mouth once daily for cholesterol 09/30/15   [name], NP  carvedilol (COREG) 6.25 MG tablet Take 1 tablet (6.25 mg total) by mouth 2 (two) times daily with a meal. For high blood pressure 11/04/15   [name], NP  hydrALAZINE (APRESOLINE) 10 MG tablet Take 1 tablet (10 mg total) by mouth 3 (three) times daily. 10/24/15   [name], NP  hydrochlorothiazide (HYDRODIURIL) 25 MG tablet Take 1 tablet (25 mg total) by mouth daily. For high blood pressure 09/30/15   [name], NP  HYDROcodone-homatropine [facility]) 5-1.5 MG/5ML syrup Take 5 mLs by mouth every 6 (six) hours as needed for cough. 11/07/15   Heather Laisure, PA-C  lisinopril (PRINIVIL,ZESTRIL) 40 MG tablet Take 1 tablet (40 mg total) by mouth daily. For high blood pressure 09/30/15   [name], NP  metFORMIN  (GLUCOPHAGE) 500 MG tablet TAKE 1 TABLET BY MOUTH 2 TIMES A DAY WITH A MEAL for diabetes 09/30/15   [name], NP  omeprazole (PRILOSEC) 20 MG capsule Take 1 capsule (20 mg total) by mouth as needed. 05/24/14   [name], NP  POLY-IRON 150 150 MG capsule TAKE 1 CAPSULE BY MOUTH 2 TIMES DAILY. 03/28/15   Tiffany L Reed, DO  potassium chloride SA (K-DUR,KLOR-CON) 20 MEQ tablet Take 1 tablet (20 mEq total) by mouth daily. 09/30/15   [name], NP  traMADol (ULTRAM) 50 MG tablet Take 1 tablet (50 mg total) by mouth every 8 (eight) hours as needed. for pain 09/30/15   [name], NP  TRUE METRIX BLOOD GLUCOSE TEST test strip CHECK BLOOD SUGAR TWICE PER DAY 10/24/15   [name], NP  TRUEPLUS LANCETS 30G MISC USE TO TEST BLOOD SUGAR 2 TIMES A DAY 09/12/14   [name], NP  vitamin B-12 (CYANOCOBALAMIN) 1000 MCG tablet Take 1,000 mcg by mouth daily.    Historical Provider, MD    Physical Exam: BP (!) 197/92 (BP Location: Left Arm)   Pulse 67   Temp 98.8 F (37.1 C) (Oral)   Resp (!) 22   Ht 5\' 9"  (1.753 m)   Wt (!) 148.6 kg (327 lb 8 oz)   SpO2 94%   BMI 48.36 kg/m   GENERAL :   Alert and cooperative, and appears to be in no acute distress. HEAD:           normocephalic. EYES:            PERRL, EOMI.  vision is grossly intact. EARS:           hearing grossly intact. NOSE:           No nasal discharge. THROAT:     Oral cavity and pharynx normal.   NECK:          supple, non-tender.  CARDIAC:    Normal S1  and S2. No gallop. No murmurs.  Vascular:     Mild left lower leg edema comparing to right.  LUNGS:       Clear to auscultation  ABDOMEN: Positive bowel sounds. Soft, nondistended, nontender. No guarding or rebound.      MSK:           No joint erythema or tenderness. Normal muscular development. EXT           : No significant deformity or joint abnormality. Neuro        : Alert, oriented to person, place, and time.                      CN II-XII intact.   SKIN:            No rash. No lesions. PSYCH:       No hallucination. Patient is not suicidal.          Labs on Admission:  Reviewed.   Radiological Exams on Admission: Dg Chest 2 View  Result Date: 11/23/2015 CLINICAL DATA:  Cough and shortness of breath for 2 weeks EXAM: CHEST  2 VIEW COMPARISON:  November 04, 2015 FINDINGS: There is no edema or consolidation. Heart size is normal. Prominence of the central pulmonary vessels with rapid peripheral tapering may be indicative of pulmonary arterial hypertension. No adenopathy. There is degenerative change in the thoracic spine. IMPRESSION: No edema or consolidation. Suspect pulmonary arterial hypertension, stable. Electronically Signed   By: Lowella Grip III M.D.   On: 11/23/2015 12:55   Ct Angio Chest Pe W/cm &/or Wo Cm  Result Date: 11/23/2015 CLINICAL DATA:  Worsening shortness of breath for 2 weeks. Cough and lower extremity pain. Clinical suspicion for pulmonary embolism. EXAM: CT ANGIOGRAPHY CHEST WITH CONTRAST TECHNIQUE: Multidetector CT imaging of the chest was performed using the standard protocol during bolus administration of intravenous contrast. Multiplanar CT image reconstructions and MIPs were obtained to evaluate the vascular anatomy. CONTRAST:  100 mL Isovue 370 COMPARISON:  Chest CTA on 12/06/2009 FINDINGS: Cardiovascular: Pulmonary embolism is seen within the distal right pulmonary artery as well as the right upper, middle, and lower lobar pulmonary artery branches. No definite left sided pulmonary emboli identified. Mild cardiomegaly noted without evidence of pericardial effusion. RV/LV ratio is 0.77, which is within normal limits. Mediastinum/Nodes: No masses or pathologically enlarged lymph nodes identified. Lungs/Pleura: No pulmonary mass, infiltrate, or effusion. Upper abdomen: 3.5 cm calcified gallstone, without evidence of cholecystitis. 2.4 cm left adrenal mass which was seen on abdomen CT in 2009, consistent with  benign adrenal adenoma. Musculoskeletal: No suspicious bone lesions or other significant abnormality identified. IMPRESSION: Pulmonary embolism within distal right pulmonary artery and all right lobar pulmonary artery branches. No CT evidence of right heart strain. Cholelithiasis and benign left adrenal adenoma again noted. Critical Value/emergent results were called by telephone at the time of interpretation on 11/23/2015 at 2:25 pm to Dr. Regenia Skeeter, who verbally acknowledged these results. Electronically Signed   By: Earle Gell M.D.   On: 11/23/2015 14:27    EKG:  Independently reviewed. NSR  Assessment/Plan  Pulmonary embolism: Unclear etiology Labs for hypercoagulability ordered before transfer Will check LE duplex Continue heparin: pharmacy consulted Oxygen therapy Monitor for next 24-48 hours Switch to oral anticoagulations before discharge   HTN: cont home meds  DM: on low dose correction  Hypokalemia: continue KCL, will monitor   Hx of prostate ca: in remission per patient. Self cath due to obstructive  uropathy   Input & Output: na Lines & Tubes: PIV DVT prophylaxis: on AC GI prophylaxis:na Consultants: na Code Status: full  Family Communication: at bedside  Disposition Plan: Obs    Gennaro Africa M.D [facility]

## 2015-11-23 NOTE — ED Provider Notes (Signed)
[name] DEPT [facility] Provider Note   CSN: [id number] Arrival date & time: 11/23/15  1112     History   Chief Complaint Chief Complaint  Patient presents with  . Cough    HPI PHILO Thomas [name] is a 65 y.o. male.  HPI Patient presents to the emergency department with continued cough and shortness of breath.  The patient was seen in the emergency department several weeks ago for cough and the patient states that he has got worsening shortness of breath over that time frame.  He states that the prescribed medications did not seem to help with his symptoms.  The patient states that he does not have any chest pain, but he did say that his legs seemed like it was swelling on the left with some discomfort. The patient denies chest pain,headache,blurred vision, neck pain, fever, cough, weakness, numbness, dizziness, anorexia, edema, abdominal pain, nausea, vomiting, diarrhea, rash, back pain, dysuria, hematemesis, bloody stool, near syncope, or syncope.  Past Medical History:  Diagnosis Date  . Arthritis   . Cancer [facility]) 2010   Prostate  . Chronic kidney disease   . Diabetes mellitus   . Hypertension     Patient Active Problem List   Diagnosis Date Noted  . Type 2 diabetes mellitus with chronic kidney disease, without long-term current use of insulin ([place]) 12/05/2014  . Anemia, iron deficiency 11/22/2013  . Severe obesity (BMI >= 40) ([place]) 05/23/2013  . B12 deficiency 05/23/2013  . Hyperlipidemia with target LDL less than 100 09/02/2012  . HTN (hypertension) 05/26/2012  . Osteoarthritis, knee 05/26/2012  . MORBID OBESITY 08/22/2008    Past Surgical History:  Procedure Laterality Date  . HERNIA REPAIR    . KNEE SURGERY    . PROSTATE SURGERY    . RADIOACTIVE SEED IMPLANT    . SHOULDER SURGERY    . uretha surgery-2014         Home Medications    Prior to Admission medications   Medication Sig Start Date End Date Taking? Authorizing Provider  amLODipine (NORVASC)  10 MG tablet Take 1 tablet (10 mg total) by mouth daily. For high blood pressure 11/04/15   [name], NP  aspirin 81 MG tablet Take 81 mg by mouth daily.    Historical Provider, MD  atorvastatin (LIPITOR) 10 MG tablet Take one tablet by mouth once daily for cholesterol 09/30/15   [name], NP  carvedilol (COREG) 6.25 MG tablet Take 1 tablet (6.25 mg total) by mouth 2 (two) times daily with a meal. For high blood pressure 11/04/15   [name], NP  doxycycline (VIBRA-TABS) 100 MG tablet Take 1 tablet (100 mg total) by mouth 2 (two) times daily. 11/04/15   [name], NP  glipiZIDE (GLUCOTROL) 5 MG tablet Take 1 tablet (5 mg total) by mouth daily before breakfast. For diabetes 09/30/15   [name], NP  hydrALAZINE (APRESOLINE) 10 MG tablet Take 1 tablet (10 mg total) by mouth 3 (three) times daily. 10/24/15   [name], NP  hydrochlorothiazide (HYDRODIURIL) 25 MG tablet Take 1 tablet (25 mg total) by mouth daily. For high blood pressure 09/30/15   [name], NP  HYDROcodone-homatropine [facility]) 5-1.5 MG/5ML syrup Take 5 mLs by mouth every 6 (six) hours as needed for cough. 11/07/15   Heather Laisure, PA-C  JARDIANCE 10 MG TABS tablet TAKE 1 TABLET BY MOUTH ONCE DAILY 03/28/15   Tiffany L Reed, DO  lisinopril (PRINIVIL,ZESTRIL) 40 MG tablet  Take 1 tablet (40 mg total) by mouth daily. For high blood pressure 09/30/15   [name], NP  metFORMIN (GLUCOPHAGE) 500 MG tablet TAKE 1 TABLET BY MOUTH 2 TIMES A DAY WITH A MEAL for diabetes 09/30/15   [name], NP  omeprazole (PRILOSEC) 20 MG capsule Take 1 capsule (20 mg total) by mouth as needed. 05/24/14   [name], NP  POLY-IRON 150 150 MG capsule TAKE 1 CAPSULE BY MOUTH 2 TIMES DAILY. 03/28/15   Tiffany L Reed, DO  potassium chloride SA (K-DUR,KLOR-CON) 20 MEQ tablet Take 1 tablet (20 mEq total) by mouth daily. 09/30/15   [name], NP  traMADol (ULTRAM) 50 MG tablet Take 1 tablet (50 mg  total) by mouth every 8 (eight) hours as needed. for pain 09/30/15   [name], NP  TRUE METRIX BLOOD GLUCOSE TEST test strip CHECK BLOOD SUGAR TWICE PER DAY 10/24/15   [name], NP  TRUEPLUS LANCETS 30G MISC USE TO TEST BLOOD SUGAR 2 TIMES A DAY 09/12/14   [name], NP  vitamin B-12 (CYANOCOBALAMIN) 1000 MCG tablet Take 1,000 mcg by mouth daily.    Historical Provider, MD    Family History History reviewed. No pertinent family history.  Social History Social History  Substance Use Topics  . Smoking status: Never Smoker  . Smokeless tobacco: Never Used  . Alcohol use No     Comment: never     Allergies   Review of patient's allergies indicates no known allergies.   Review of Systems Review of Systems All other systems negative except as documented in the HPI. All pertinent positives and negatives as reviewed in the HPI.  Physical Exam Updated Vital Signs BP 164/72 (BP Location: Right Arm)   Pulse 62   Temp 98.3 F (36.8 C) (Oral)   Resp 22   Ht 5\' 11"  (1.803 m)   Wt (!) 149.7 kg   SpO2 94%   BMI 46.03 kg/m   Physical Exam  Constitutional: He is oriented to person, place, and time. He appears well-developed and well-nourished. No distress.  HENT:  Head: Normocephalic and atraumatic.  Mouth/Throat: Oropharynx is clear and moist.  Eyes: Pupils are equal, round, and reactive to light.  Neck: Normal range of motion. Neck supple.  Cardiovascular: Normal rate, regular rhythm and normal heart sounds.  Exam reveals no gallop and no friction rub.   No murmur heard. Pulmonary/Chest: Effort normal and breath sounds normal. No respiratory distress. He has no wheezes.  Abdominal: Soft. Bowel sounds are normal. He exhibits no distension. There is no tenderness.  Neurological: He is alert and oriented to person, place, and time. He exhibits normal muscle tone. Coordination normal.  Skin: Skin is warm and dry. No rash noted. No erythema.  Psychiatric: He has  a normal mood and affect. His behavior is normal.  Nursing note and vitals reviewed.    ED Treatments / Results  Labs (all labs ordered are listed, but only abnormal results are displayed) Labs Reviewed  BASIC METABOLIC PANEL - Abnormal; Notable for the following:       Result Value   Potassium 3.2 (*)    Glucose, Bld 352 (*)    Calcium 8.8 (*)    All other components within normal limits  CBC WITH DIFFERENTIAL/PLATELET  HEPARIN LEVEL (UNFRACTIONATED)  ANTITHROMBIN III  PROTEIN C ACTIVITY  PROTEIN C, TOTAL  PROTEIN S ACTIVITY  PROTEIN S, TOTAL  LUPUS ANTICOAGULANT PANEL  BETA-2-GLYCOPROTEIN I  ABS, IGG/M/A  HOMOCYSTEINE  FACTOR 5 LEIDEN  PROTHROMBIN GENE MUTATION  CARDIOLIPIN ANTIBODIES, IGG, IGM, IGA    EKG  EKG Interpretation  Date/Time:  Saturday November 23 2015 11:33:56 EDT Ventricular Rate:  69 PR Interval:    QRS Duration: 91 QT Interval:  490 QTC Calculation: 525 R Axis:   29 Text Interpretation:  Sinus rhythm Nonspecific T abnormalities, anterior leads Prolonged QT interval T wave inversions are new compared to 2011 Confirmed by GOLDSTON MD, [place] [id number]) on 11/23/2015 11:39:36 AM       Radiology Dg Chest 2 View  Result Date: 11/23/2015 CLINICAL DATA:  Cough and shortness of breath for 2 weeks EXAM: CHEST  2 VIEW COMPARISON:  November 04, 2015 FINDINGS: There is no edema or consolidation. Heart size is normal. Prominence of the central pulmonary vessels with rapid peripheral tapering may be indicative of pulmonary arterial hypertension. No adenopathy. There is degenerative change in the thoracic spine. IMPRESSION: No edema or consolidation. Suspect pulmonary arterial hypertension, stable. Electronically Signed   By: Lowella Grip III M.D.   On: 11/23/2015 12:55   Ct Angio Chest Pe W/cm &/or Wo Cm  Result Date: 11/23/2015 CLINICAL DATA:  Worsening shortness of breath for 2 weeks. Cough and lower extremity pain. Clinical suspicion for pulmonary embolism.  EXAM: CT ANGIOGRAPHY CHEST WITH CONTRAST TECHNIQUE: Multidetector CT imaging of the chest was performed using the standard protocol during bolus administration of intravenous contrast. Multiplanar CT image reconstructions and MIPs were obtained to evaluate the vascular anatomy. CONTRAST:  100 mL Isovue 370 COMPARISON:  Chest CTA on 12/06/2009 FINDINGS: Cardiovascular: Pulmonary embolism is seen within the distal right pulmonary artery as well as the right upper, middle, and lower lobar pulmonary artery branches. No definite left sided pulmonary emboli identified. Mild cardiomegaly noted without evidence of pericardial effusion. RV/LV ratio is 0.77, which is within normal limits. Mediastinum/Nodes: No masses or pathologically enlarged lymph nodes identified. Lungs/Pleura: No pulmonary mass, infiltrate, or effusion. Upper abdomen: 3.5 cm calcified gallstone, without evidence of cholecystitis. 2.4 cm left adrenal mass which was seen on abdomen CT in 2009, consistent with benign adrenal adenoma. Musculoskeletal: No suspicious bone lesions or other significant abnormality identified. IMPRESSION: Pulmonary embolism within distal right pulmonary artery and all right lobar pulmonary artery branches. No CT evidence of right heart strain. Cholelithiasis and benign left adrenal adenoma again noted. Critical Value/emergent results were called by telephone at the time of interpretation on 11/23/2015 at 2:25 pm to Dr. Regenia Skeeter, who verbally acknowledged these results. Electronically Signed   By: Earle Gell M.D.   On: 11/23/2015 14:27    Procedures Procedures (including critical care time)  Medications Ordered in ED Medications  heparin ADULT infusion 100 units/mL (25000 units/213mL sodium chloride 0.45%) (1,850 Units/hr Intravenous New Bag/Given 11/23/15 1526)  iopamidol (ISOVUE-370) 76 % injection 100 mL (100 mLs Intravenous Contrast Given 11/23/15 1358)  heparin bolus via infusion 6,500 Units (6,500 Units Intravenous  Bolus from Bag 11/23/15 1525)     Initial Impression / Assessment and Plan / ED Course  I have reviewed the triage vital signs and the nursing notes.  Pertinent labs & imaging results that were available during my care of the patient were reviewed by me and considered in my medical decision making (see chart for details).  Clinical Course    Patient will be admitted.  I spoke with the [facility] hospitalist, who will admit the patient has been started on heparin  Final Clinical Impressions(s) / ED Diagnoses  Final diagnoses:  None    New Prescriptions New Prescriptions   No medications on file     Dalia Heading, PA-C 11/23/15 [place], MD 11/29/15 1740

## 2015-11-23 NOTE — ED Notes (Signed)
Patient transported to X-ray 

## 2015-11-24 ENCOUNTER — Observation Stay (HOSPITAL_COMMUNITY): Payer: Medicare Other

## 2015-11-24 ENCOUNTER — Observation Stay (HOSPITAL_BASED_OUTPATIENT_CLINIC_OR_DEPARTMENT_OTHER): Payer: Medicare Other

## 2015-11-24 DIAGNOSIS — Z7982 Long term (current) use of aspirin: Secondary | ICD-10-CM | POA: Diagnosis not present

## 2015-11-24 DIAGNOSIS — I2699 Other pulmonary embolism without acute cor pulmonale: Secondary | ICD-10-CM

## 2015-11-24 DIAGNOSIS — D3502 Benign neoplasm of left adrenal gland: Secondary | ICD-10-CM | POA: Diagnosis present

## 2015-11-24 DIAGNOSIS — E876 Hypokalemia: Secondary | ICD-10-CM | POA: Diagnosis present

## 2015-11-24 DIAGNOSIS — R05 Cough: Secondary | ICD-10-CM | POA: Diagnosis not present

## 2015-11-24 DIAGNOSIS — M79609 Pain in unspecified limb: Secondary | ICD-10-CM

## 2015-11-24 DIAGNOSIS — Z7984 Long term (current) use of oral hypoglycemic drugs: Secondary | ICD-10-CM | POA: Diagnosis not present

## 2015-11-24 DIAGNOSIS — I82402 Acute embolism and thrombosis of unspecified deep veins of left lower extremity: Secondary | ICD-10-CM | POA: Diagnosis not present

## 2015-11-24 DIAGNOSIS — I10 Essential (primary) hypertension: Secondary | ICD-10-CM | POA: Diagnosis not present

## 2015-11-24 DIAGNOSIS — N182 Chronic kidney disease, stage 2 (mild): Secondary | ICD-10-CM | POA: Diagnosis present

## 2015-11-24 DIAGNOSIS — K8020 Calculus of gallbladder without cholecystitis without obstruction: Secondary | ICD-10-CM | POA: Diagnosis present

## 2015-11-24 DIAGNOSIS — E1122 Type 2 diabetes mellitus with diabetic chronic kidney disease: Secondary | ICD-10-CM | POA: Diagnosis present

## 2015-11-24 DIAGNOSIS — N139 Obstructive and reflux uropathy, unspecified: Secondary | ICD-10-CM | POA: Diagnosis present

## 2015-11-24 DIAGNOSIS — I129 Hypertensive chronic kidney disease with stage 1 through stage 4 chronic kidney disease, or unspecified chronic kidney disease: Secondary | ICD-10-CM | POA: Diagnosis present

## 2015-11-24 DIAGNOSIS — Z8546 Personal history of malignant neoplasm of prostate: Secondary | ICD-10-CM | POA: Diagnosis not present

## 2015-11-24 DIAGNOSIS — Z6841 Body Mass Index (BMI) 40.0 and over, adult: Secondary | ICD-10-CM | POA: Diagnosis not present

## 2015-11-24 DIAGNOSIS — I82412 Acute embolism and thrombosis of left femoral vein: Secondary | ICD-10-CM | POA: Diagnosis present

## 2015-11-24 LAB — CBC
HCT: 41.3 % (ref 39.0–52.0)
Hemoglobin: 12.8 g/dL — ABNORMAL LOW (ref 13.0–17.0)
MCH: 26.4 pg (ref 26.0–34.0)
MCHC: 31 g/dL (ref 30.0–36.0)
MCV: 85.3 fL (ref 78.0–100.0)
Platelets: 220 10*3/uL (ref 150–400)
RBC: 4.84 MIL/uL (ref 4.22–5.81)
RDW: 13.7 % (ref 11.5–15.5)
WBC: 8.3 10*3/uL (ref 4.0–10.5)

## 2015-11-24 LAB — GLUCOSE, CAPILLARY
Glucose-Capillary: 294 mg/dL — ABNORMAL HIGH (ref 65–99)
Glucose-Capillary: 315 mg/dL — ABNORMAL HIGH (ref 65–99)
Glucose-Capillary: 261 mg/dL — ABNORMAL HIGH (ref 65–99)
Glucose-Capillary: 201 mg/dL — ABNORMAL HIGH (ref 65–99)

## 2015-11-24 LAB — ECHOCARDIOGRAM COMPLETE
Height: 69 in
Weight: 5235.2 oz

## 2015-11-24 LAB — HEPARIN LEVEL (UNFRACTIONATED)
Heparin Unfractionated: 0.45 IU/mL (ref 0.30–0.70)
Heparin Unfractionated: 0.49 IU/mL (ref 0.30–0.70)

## 2015-11-24 MED ORDER — INSULIN ASPART 100 UNIT/ML ~~LOC~~ SOLN
0.0000 [IU] | Freq: Three times a day (TID) | SUBCUTANEOUS | Status: DC
  Administered 2015-11-24 – 2015-11-25 (×4): 8 [IU] via SUBCUTANEOUS
  Administered 2015-11-26: 5 [IU] via SUBCUTANEOUS
  Administered 2015-11-26: 8 [IU] via SUBCUTANEOUS

## 2015-11-24 MED ORDER — INSULIN GLARGINE 100 UNIT/ML ~~LOC~~ SOLN
15.0000 [IU] | Freq: Every day | SUBCUTANEOUS | Status: DC
  Filled 2015-11-24 (×2): qty 0.15

## 2015-11-24 MED ORDER — INSULIN ASPART 100 UNIT/ML ~~LOC~~ SOLN
0.0000 [IU] | Freq: Every day | SUBCUTANEOUS | Status: DC
  Administered 2015-11-24 – 2015-11-25 (×2): 2 [IU] via SUBCUTANEOUS

## 2015-11-24 NOTE — Progress Notes (Signed)
  Echocardiogram 2D Echocardiogram has been performed.  Thomas [name] 11/24/2015, 3:01 PM

## 2015-11-24 NOTE — Progress Notes (Signed)
*  Preliminary Results* Bilateral lower extremity venous duplex completed.  The right lower extremity is negative for deep vein thrombosis.   The left lower extremity is positive for acute deep vein thrombosis involving the left saphenofemoral junction, common femoral, femoral, and popliteal veins. The thrombus in the left common femoral vein and saphenofemoral junction is mobile.   There is no evidence of Baker's cyst bilaterally.   Preliminary results discussed with Dr. Algis Liming.  11/24/2015 3:34 PM Maudry Mayhew, BS, RVT, RDCS, RDMS

## 2015-11-24 NOTE — Progress Notes (Signed)
Addendum  Reviewed lower extremity venous Doppler results. Discussed with interventional radiology on call and would not place an IVC filter at this time.  Vernell Leep, MD, FACP, FHM. [facility] Pager [phone]  If 7PM-7AM, please contact night-coverage [email or web address] Password [facility] 11/24/2015, 4:39 PM

## 2015-11-24 NOTE — Progress Notes (Signed)
PROGRESS NOTE  Thomas [name]  [id number] DOB: [birth year]  DOA: 11/23/2015 PCP: [name], NP   Brief Narrative:  64 year old male, married, independent of activities of daily living, PMH of DM 2, HTN, chronic kidney disease, prostate cancer status post seed implantation (Dr.Mark Nesi), no personal history of VTE, positive family history of PE in his daughter (treated with Xarelto) and nephew (brother's son), had ongoing cough for the last 3-4 weeks, did not respond adequately to treatment for acute bronchitis, noted worsening dyspnea on minimal exertion and ongoing cough, presented to ED 10/7 where CTA chest confirmed PE without evidence of right heart strain. Admitted to the hospital and started on IV heparin drip.   Assessment & Plan:   Active Problems:   Pulmonary embolism (HCC)   Acute pulmonary embolism - No clear provoking event. - Hemodynamically stable. No right heart strain on CT. - Empirically started on IV heparin infusion. Continue additional 24 hours and then transitioned to oral anticoagulants at discharge. I discussed extensively with patient and his family at bedside regarding options and risks and benefits of warfarin versus NOAC's. Patient wishes to think about this prior to deciding. - At least 6 months of anticoagulation may be longer to be determined during outpatient follow-up. Recommend outpatient evaluation by hematology for hypercoagulable workup. - Follow-up lower extremity venous Doppler results. - Need to check oxygenation prior to discharge. - Check 2-D echo.  Essential hypertension - Mildly uncontrolled. Continue amlodipine, carvedilol, hydralazine, HCTZ and lisinopril.  DM type II - Uncontrolled. Add low-dose Lantus. Continue NovoLog SSI.  Hypokalemia - Replace and follow  Morbid obesity/Body mass index is 48.32 kg/m. - Eventually will need diet, exercise and weight loss.  Prostate cancer status post seed - Outpatient follow-up  with urology to determine if this is stated in remission. Apparently self catheterizes due to obstructive uropathy.  Stage II chronic kidney disease - Creatinine is normal  Cholelithiasis and benign left adrenal adenoma, noted on CT - Outpatient follow-up   DVT prophylaxis: On IV heparin infusion Code Status: Full Family Communication: Discussed in detail with patient's spouse and 2 daughters at bedside. Updated care and answered questions. Disposition Plan: DC home possibly 10/9   Consultants:   None  Procedures:   None  Antimicrobials:   None    Subjective: Still having cough with white sputum. No chest pain. Dyspnea is better but has not ambulated much. No bleeding reported.  Objective:  Vitals:   11/23/15 1830 11/23/15 2026 11/23/15 2242 11/24/15 0552  BP: 147/92 (!) 197/92 (!) 155/62 (!) 156/80  Pulse: 77 67 75 73  Resp: 24 (!) 22  18  Temp:  98.8 F (37.1 C)  98.4 F (36.9 C)  TempSrc:  Oral  Oral  SpO2: 93% 94%  92%  Weight:  (!) 148.6 kg (327 lb 8 oz)  (!) 148.4 kg (327 lb 3.2 oz)  Height:  5\' 9"  (1.753 m)      Intake/Output Summary (Last 24 hours) at 11/24/15 1211 Last data filed at 11/24/15 0651  Gross per 24 hour  Intake              840 ml  Output              650 ml  Net              190 ml   Filed Weights   11/23/15 1122 11/23/15 2026 11/24/15 0552  Weight: (!) 149.7 kg (330 lb) (!) 148.6 kg (327 lb  8 oz) (!) 148.4 kg (327 lb 3.2 oz)    Examination:  General exam: Pleasant middle-aged male, moderately built and morbidly obese, lying comfortably propped up in bed. Respiratory system: Clear to auscultation. Respiratory effort normal. Cardiovascular system: S1 & S2 heard, RRR. No JVD, murmurs, rubs, gallops or clicks. No pedal edema. Telemetry: Sinus rhythm and occasional PVCs. Gastrointestinal system: Abdomen is nondistended, soft and nontender. No organomegaly or masses felt. Normal bowel sounds heard. Central nervous system: Alert and  oriented. No focal neurological deficits. Extremities: Symmetric 5 x 5 power. Skin: No rashes, lesions or ulcers Psychiatry: Judgement and insight appear normal. Mood & affect appropriate.     Data Reviewed: I have personally reviewed following labs and imaging studies  CBC:  Recent Labs Lab 11/23/15 1205 11/24/15 0436  WBC 7.7 8.3  NEUTROABS 5.6  --   HGB 13.5 12.8*  HCT 42.2 41.3  MCV 83.1 85.3  PLT 229 [id number]   Basic Metabolic Panel:  Recent Labs Lab 11/23/15 1205  NA 138  K 3.2*  CL 101  CO2 28  GLUCOSE 352*  BUN 19  CREATININE 1.04  CALCIUM 8.8*   GFR: Estimated Creatinine Clearance: 102 mL/min (by C-G formula based on SCr of 1.04 mg/dL). Liver Function Tests: No results for input(s): AST, ALT, ALKPHOS, BILITOT, PROT, ALBUMIN in the last 168 hours. No results for input(s): LIPASE, AMYLASE in the last 168 hours. No results for input(s): AMMONIA in the last 168 hours. Coagulation Profile: No results for input(s): INR, PROTIME in the last 168 hours. Cardiac Enzymes: No results for input(s): CKTOTAL, CKMB, CKMBINDEX, TROPONINI in the last 168 hours. BNP (last 3 results) No results for input(s): PROBNP in the last 8760 hours. HbA1C: No results for input(s): HGBA1C in the last 72 hours. CBG:  Recent Labs Lab 11/24/15 0624 11/24/15 1105  GLUCAP 294* 315*   Lipid Profile: No results for input(s): CHOL, HDL, LDLCALC, TRIG, CHOLHDL, LDLDIRECT in the last 72 hours. Thyroid Function Tests: No results for input(s): TSH, T4TOTAL, FREET4, T3FREE, THYROIDAB in the last 72 hours. Anemia Panel: No results for input(s): VITAMINB12, FOLATE, FERRITIN, TIBC, IRON, RETICCTPCT in the last 72 hours.  Sepsis Labs: No results for input(s): PROCALCITON, LATICACIDVEN in the last 168 hours.  No results found for this or any previous visit (from the past 240 hour(s)).       Radiology Studies: Dg Chest 2 View  Result Date: 11/23/2015 CLINICAL DATA:  Cough and shortness  of breath for 2 weeks EXAM: CHEST  2 VIEW COMPARISON:  November 04, 2015 FINDINGS: There is no edema or consolidation. Heart size is normal. Prominence of the central pulmonary vessels with rapid peripheral tapering may be indicative of pulmonary arterial hypertension. No adenopathy. There is degenerative change in the thoracic spine. IMPRESSION: No edema or consolidation. Suspect pulmonary arterial hypertension, stable. Electronically Signed   By: Lowella Grip III M.D.   On: 11/23/2015 12:55   Ct Angio Chest Pe W/cm &/or Wo Cm  Result Date: 11/23/2015 CLINICAL DATA:  Worsening shortness of breath for 2 weeks. Cough and lower extremity pain. Clinical suspicion for pulmonary embolism. EXAM: CT ANGIOGRAPHY CHEST WITH CONTRAST TECHNIQUE: Multidetector CT imaging of the chest was performed using the standard protocol during bolus administration of intravenous contrast. Multiplanar CT image reconstructions and MIPs were obtained to evaluate the vascular anatomy. CONTRAST:  100 mL Isovue 370 COMPARISON:  Chest CTA on 12/06/2009 FINDINGS: Cardiovascular: Pulmonary embolism is seen within the distal right pulmonary artery as well  as the right upper, middle, and lower lobar pulmonary artery branches. No definite left sided pulmonary emboli identified. Mild cardiomegaly noted without evidence of pericardial effusion. RV/LV ratio is 0.77, which is within normal limits. Mediastinum/Nodes: No masses or pathologically enlarged lymph nodes identified. Lungs/Pleura: No pulmonary mass, infiltrate, or effusion. Upper abdomen: 3.5 cm calcified gallstone, without evidence of cholecystitis. 2.4 cm left adrenal mass which was seen on abdomen CT in 2009, consistent with benign adrenal adenoma. Musculoskeletal: No suspicious bone lesions or other significant abnormality identified. IMPRESSION: Pulmonary embolism within distal right pulmonary artery and all right lobar pulmonary artery branches. No CT evidence of right heart  strain. Cholelithiasis and benign left adrenal adenoma again noted. Critical Value/emergent results were called by telephone at the time of interpretation on 11/23/2015 at 2:25 pm to Dr. Regenia Skeeter, who verbally acknowledged these results. Electronically Signed   By: Earle Gell M.D.   On: 11/23/2015 14:27        Scheduled Meds: . amLODipine  10 mg Oral Daily  . aspirin  81 mg Oral Daily  . atorvastatin  10 mg Oral q1800  . carvedilol  6.25 mg Oral BID WC  . hydrALAZINE  10 mg Oral TID  . hydrochlorothiazide  25 mg Oral Daily  . insulin aspart  0-9 Units Subcutaneous TID WC  . lisinopril  40 mg Oral Daily  . pantoprazole  40 mg Oral Daily  . potassium chloride SA  20 mEq Oral Daily   Continuous Infusions: . heparin 1,850 Units/hr (11/24/15 0749)     LOS: 0 days      [facility], MD [facility] Pager [phone]  If 7PM-7AM, please contact night-coverage [email or web address] Password [email or web address] 11/24/2015, 12:11 PM

## 2015-11-24 NOTE — Progress Notes (Signed)
ANTICOAGULATION CONSULT NOTE - Follow-up Consult  Pharmacy Consult for heparin Indication: pulmonary embolus  No Known Allergies  Patient Measurements: Height: 5\' 9"  (175.3 cm) Weight: (!) 327 lb 3.2 oz (148.4 kg) IBW/kg (Calculated) : 70.7 Heparin Dosing Weight: 110 kg  Vital Signs: Temp: 98.4 F (36.9 C) (10/08 0552) Temp Source: Oral (10/08 0552) BP: 156/80 (10/08 0552) Pulse Rate: 73 (10/08 0552)  Labs:  Recent Labs  11/23/15 1205 11/24/15 0436  HGB 13.5 12.8*  HCT 42.2 41.3  PLT 229 220  HEPARINUNFRC  --  0.45  CREATININE 1.04  --     Estimated Creatinine Clearance: 102 mL/min (by C-G formula based on SCr of 1.04 mg/dL).  Assessment: 65 yo m on heparin for PE. Heparin level therapeutic (0.45) on gtt at 1850 units/hr. CBC stable. No bleeding noted.   Goal of Therapy:  Heparin level 0.3-0.7 units/ml Monitor platelets by anticoagulation protocol: Yes   Plan:  Continue heparin infusion 1850 units/hr F/u 6 hr confirmatory heparin level  Sherlon Handing, PharmD, BCPS Clinical pharmacist, pager [phone] 11/24/2015 7:03 AM

## 2015-11-24 NOTE — Progress Notes (Signed)
Patient was doing an in and out cath. On himslef and h.r. Went up to 130-140 svt vs at. Fib. Patient voiced no complaints

## 2015-11-24 NOTE — Progress Notes (Signed)
Patient in S.R. Having freq. PVC's and pairs. No complaints voiced

## 2015-11-24 NOTE — Progress Notes (Signed)
ANTICOAGULATION CONSULT NOTE - Follow-up Consult  Pharmacy Consult for heparin Indication: pulmonary embolus  No Known Allergies  Patient Measurements: Height: 5\' 9"  (175.3 cm) Weight: (!) 327 lb 3.2 oz (148.4 kg) IBW/kg (Calculated) : 70.7 Heparin Dosing Weight: 110 kg  Vital Signs: Temp: 98.4 F (36.9 C) (10/08 0552) Temp Source: Oral (10/08 0552) BP: 156/80 (10/08 0552) Pulse Rate: 73 (10/08 0552)  Labs:  Recent Labs  11/23/15 1205 11/24/15 0436 11/24/15 1204  HGB 13.5 12.8*  --   HCT 42.2 41.3  --   PLT 229 220  --   HEPARINUNFRC  --  0.45 0.49  CREATININE 1.04  --   --     Estimated Creatinine Clearance: 102 mL/min (by C-G formula based on SCr of 1.04 mg/dL).  Assessment: 65 yo m on heparin for PE. Heparin level therapeutic (0.4) on gtt at 1850 units/hr. CBC stable. No bleeding noted.   Goal of Therapy:  Heparin level 0.3-0.7 units/ml Monitor platelets by anticoagulation protocol: Yes   Plan:  Continue heparin infusion 1850 units/hr Daily HL/CBC  Erin Hearing PharmD., BCPS Clinical Pharmacist Pager [phone] 11/24/2015 1:34 PM

## 2015-11-25 DIAGNOSIS — I82402 Acute embolism and thrombosis of unspecified deep veins of left lower extremity: Secondary | ICD-10-CM

## 2015-11-25 LAB — BASIC METABOLIC PANEL
Anion gap: 10 (ref 5–15)
BUN: 12 mg/dL (ref 6–20)
CO2: 29 mmol/L (ref 22–32)
Calcium: 8.4 mg/dL — ABNORMAL LOW (ref 8.9–10.3)
Chloride: 100 mmol/L — ABNORMAL LOW (ref 101–111)
Creatinine, Ser: 1.06 mg/dL (ref 0.61–1.24)
GFR calc Af Amer: >60 mL/min (ref >60)
GFR calc non Af Amer: >60 mL/min (ref >60)
Glucose, Bld: 195 mg/dL — ABNORMAL HIGH (ref 65–99)
Potassium: 2.9 mmol/L — ABNORMAL LOW (ref 3.5–5.1)
Sodium: 139 mmol/L (ref 135–145)

## 2015-11-25 LAB — GLUCOSE, CAPILLARY
Glucose-Capillary: 247 mg/dL — ABNORMAL HIGH (ref 65–99)
Glucose-Capillary: 275 mg/dL — ABNORMAL HIGH (ref 65–99)
Glucose-Capillary: 280 mg/dL — ABNORMAL HIGH (ref 65–99)
Glucose-Capillary: 252 mg/dL — ABNORMAL HIGH (ref 65–99)
Glucose-Capillary: 265 mg/dL — ABNORMAL HIGH (ref 65–99)
Glucose-Capillary: 247 mg/dL — ABNORMAL HIGH (ref 65–99)

## 2015-11-25 LAB — HEMOGLOBIN A1C
Hgb A1c MFr Bld: 8.9 % — ABNORMAL HIGH (ref 4.8–5.6)
Mean Plasma Glucose: 209 mg/dL

## 2015-11-25 LAB — MAGNESIUM: Magnesium: 2 mg/dL (ref 1.7–2.4)

## 2015-11-25 LAB — CBC
HCT: 42 % (ref 39.0–52.0)
Hemoglobin: 13 g/dL (ref 13.0–17.0)
MCH: 26.4 pg (ref 26.0–34.0)
MCHC: 31 g/dL (ref 30.0–36.0)
MCV: 85.4 fL (ref 78.0–100.0)
Platelets: 233 10*3/uL (ref 150–400)
RBC: 4.92 MIL/uL (ref 4.22–5.81)
RDW: 13.8 % (ref 11.5–15.5)
WBC: 8.1 10*3/uL (ref 4.0–10.5)

## 2015-11-25 LAB — HEPARIN LEVEL (UNFRACTIONATED): Heparin Unfractionated: 0.46 IU/mL (ref 0.30–0.70)

## 2015-11-25 MED ORDER — INSULIN ASPART 100 UNIT/ML ~~LOC~~ SOLN
5.0000 [IU] | Freq: Three times a day (TID) | SUBCUTANEOUS | Status: DC
  Administered 2015-11-26 (×2): 5 [IU] via SUBCUTANEOUS

## 2015-11-25 MED ORDER — POTASSIUM CHLORIDE CRYS ER 20 MEQ PO TBCR
40.0000 meq | EXTENDED_RELEASE_TABLET | ORAL | Status: AC
  Administered 2015-11-25 (×2): 40 meq via ORAL
  Filled 2015-11-25 (×2): qty 2

## 2015-11-25 MED ORDER — POTASSIUM CHLORIDE CRYS ER 20 MEQ PO TBCR
20.0000 meq | EXTENDED_RELEASE_TABLET | Freq: Every day | ORAL | Status: DC
  Administered 2015-11-26: 20 meq via ORAL
  Filled 2015-11-25: qty 1

## 2015-11-25 MED ORDER — INSULIN GLARGINE 100 UNIT/ML ~~LOC~~ SOLN
20.0000 [IU] | Freq: Every day | SUBCUTANEOUS | Status: DC
Start: 2015-11-25 — End: 2015-11-26
  Administered 2015-11-25 – 2015-11-26 (×2): 20 [IU] via SUBCUTANEOUS
  Filled 2015-11-25 (×3): qty 0.2

## 2015-11-25 NOTE — Progress Notes (Addendum)
PROGRESS NOTE  Thomas [name]  [id number] DOB: August 19, 1950  DOA: 11/23/2015 PCP: [name], NP   Brief Narrative:  65 year old male, married, independent of activities of daily living, PMH of DM 2, HTN, chronic kidney disease, prostate cancer status post seed implantation (Dr.Mark Nesi), no personal history of VTE, positive family history of PE in his daughter (treated with Xarelto) and nephew (brother's son), had ongoing cough for the last 3-4 weeks, did not respond adequately to treatment for acute bronchitis, noted worsening dyspnea on minimal exertion and ongoing cough, presented to ED 10/7 where CTA chest confirmed PE without evidence of right heart strain. Admitted to the hospital and started on IV heparin drip. Plan is to continue IV heparin drip until tomorrow (11/26/15), then transition to Xarelto at discharge.   Assessment & Plan:   Active Problems:   Pulmonary embolism (HCC)   Acute pulmonary embolism and left lower extremity DVT - No clear provoking event. - Hemodynamically stable. No right heart strain on CT. - Patient was started on IV heparin infusion at approximately 11 PM on 11/23/15. Continue IV heparin infusion until 11/26/15 which would be approximately 48 hours. At that time can transition to PO Xarelto at DC. - I discussed extensively with patient and his family at bedside yesterday and today regarding options and risks and benefits of warfarin versus NOAC's. Patient wishes to go with Xarelto. - At least 6 months of anticoagulation & may be longer> to be determined during outpatient follow-up. - Recommend outpatient evaluation by hematology for hypercoagulable workup. - Lower extremity venous Doppler results as below but showed extensive left lower extremity DVT and the thrombus in the left common femoral vein and saphenofemoral junction is mobile. I discussed with IR yesterday and Radiology & CCM today. Since patient has moderate clot burden without right  heart strain, would not place an IVC filter and would continue with anticoagulation alone at this time. - Need to check oxygenation prior to discharge. - 2-D echo: LVEF 55-60 percent and grade 2 diastolic dysfunction. Normal RV systolic function. - Can DC aspirin 81 MG daily (taking for primary prophylaxis) when Xarelto is started.  Essential hypertension - Mildly uncontrolled. Continue amlodipine, carvedilol, hydralazine, HCTZ and lisinopril.  DM type II - Uncontrolled. Due to uncontrolled CBGs, increased Lantus from 15 units to 20 units daily. A1c 8.9 suggesting poor outpatient control. Add mealtime NovoLog to SSI. May consider discharging home on insulin's.  Hypokalemia - Replace aggressively and follow. Magnesium 2.  Morbid obesity/Body mass index is 49.53 kg/m. - Eventually will need diet, exercise and weight loss.  Prostate cancer status post seed - Outpatient follow-up with urology to determine if this is still in remission. Apparently self catheterizes due to obstructive uropathy.  Stage II chronic kidney disease - Creatinine is normal  Cholelithiasis and benign left adrenal adenoma, noted on CT - Outpatient follow-up   DVT prophylaxis: On IV heparin infusion Code Status: Full Family Communication: Discussed in detail with patient's daughter at bedside (had discussed with patient's spouse and the other 2 daughters yesterday). Updated care and answered questions.  Disposition Plan: DC home possibly 10/10   Consultants:   None  Procedures:   2-D echo 11/24/15: Study Conclusions  - Left ventricle: The cavity size was normal. There was moderate   concentric hypertrophy. Systolic function was normal. The   estimated ejection fraction was in the range of 55% to 60%. Wall   motion was normal; there were no regional wall motion   abnormalities.  Features are consistent with a pseudonormal left   ventricular filling pattern, with concomitant abnormal relaxation   and  increased filling pressure (grade 2 diastolic dysfunction). - Aortic valve: There was trivial regurgitation. - Left atrium: The atrium was moderately dilated. - Right atrium: The atrium was mildly to moderately dilated.   Bilateral lower extremity venous duplex completed.  The right lower extremity is negative for deep vein thrombosis.   The left lower extremity is positive for acute deep vein thrombosis involving the left saphenofemoral junction, common femoral, femoral, and popliteal veins. The thrombus in the left common femoral vein and saphenofemoral junction is mobile.   There is no evidence of Baker's cyst bilaterally.  Antimicrobials:   None    Subjective: Overall continues to make improvement. Improved cough and dyspnea. Able to walk to the bathroom without significant dyspnea. No bleeding. No chest pain.  Objective:  Vitals:   11/24/15 1421 11/24/15 2046 11/25/15 0551 11/25/15 1459  BP: (!) 142/63 (!) 144/76 (!) 164/77 (!) 126/43  Pulse: 75 61 (!) 51 70  Resp: 19 20 20 20   Temp: 98.7 F (37.1 C) 98.4 F (36.9 C) 98.3 F (36.8 C)   TempSrc: Oral Oral Oral   SpO2: 98% 97% 96% 95%  Weight:   (!) 152.1 kg (335 lb 6.4 oz)   Height:        Intake/Output Summary (Last 24 hours) at 11/25/15 1704 Last data filed at 11/25/15 1459  Gross per 24 hour  Intake          1313.97 ml  Output             1160 ml  Net           153.97 ml   Filed Weights   11/23/15 2026 11/24/15 0552 11/25/15 0551  Weight: (!) 148.6 kg (327 lb 8 oz) (!) 148.4 kg (327 lb 3.2 oz) (!) 152.1 kg (335 lb 6.4 oz)    Examination:  General exam: Pleasant middle-aged male, moderately built and morbidly obese, lying comfortably propped up in bed.Patient's daughter is at bedside. Respiratory system: Clear to auscultation. Respiratory effort normal. Cardiovascular system: S1 & S2 heard, RRR. No JVD, murmurs, rubs, gallops or clicks. No pedal edema. Telemetry: Sinus rhythm and occasional  PVCs/bigemini. Gastrointestinal system: Abdomen is nondistended, soft and nontender. No organomegaly or masses felt. Normal bowel sounds heard. Central nervous system: Alert and oriented. No focal neurological deficits. Extremities: Symmetric 5 x 5 power. Left leg feels slightly more warm than right but size appears symmetrical. Good symmetric peripheral pulses. Skin: No rashes, lesions or ulcers Psychiatry: Judgement and insight appear normal. Mood & affect appropriate.     Data Reviewed: I have personally reviewed following labs and imaging studies  CBC:  Recent Labs Lab 11/23/15 1205 11/24/15 0436 11/25/15 0157  WBC 7.7 8.3 8.1  NEUTROABS 5.6  --   --   HGB 13.5 12.8* 13.0  HCT 42.2 41.3 42.0  MCV 83.1 85.3 85.4  PLT 229 220 0000000   Basic Metabolic Panel:  Recent Labs Lab 11/23/15 1205 11/25/15 0153 11/25/15 0157  NA 138  --  139  K 3.2*  --  2.9*  CL 101  --  100*  CO2 28  --  29  GLUCOSE 352*  --  195*  BUN 19  --  12  CREATININE 1.04  --  1.06  CALCIUM 8.8*  --  8.4*  MG  --  2.0  --    GFR: Estimated Creatinine  Clearance: 101.5 mL/min (by C-G formula based on SCr of 1.06 mg/dL). Liver Function Tests: No results for input(s): AST, ALT, ALKPHOS, BILITOT, PROT, ALBUMIN in the last 168 hours. No results for input(s): LIPASE, AMYLASE in the last 168 hours. No results for input(s): AMMONIA in the last 168 hours. Coagulation Profile: No results for input(s): INR, PROTIME in the last 168 hours. Cardiac Enzymes: No results for input(s): CKTOTAL, CKMB, CKMBINDEX, TROPONINI in the last 168 hours. BNP (last 3 results) No results for input(s): PROBNP in the last 8760 hours. HbA1C:  Recent Labs  11/24/15 1300  HGBA1C 8.9*   CBG:  Recent Labs Lab 11/24/15 1639 11/24/15 2109 11/25/15 0618 11/25/15 1103 11/25/15 1627  GLUCAP 261* 201* 275* 280* 252*   Lipid Profile: No results for input(s): CHOL, HDL, LDLCALC, TRIG, CHOLHDL, LDLDIRECT in the last 72  hours. Thyroid Function Tests: No results for input(s): TSH, T4TOTAL, FREET4, T3FREE, THYROIDAB in the last 72 hours. Anemia Panel: No results for input(s): VITAMINB12, FOLATE, FERRITIN, TIBC, IRON, RETICCTPCT in the last 72 hours.  Sepsis Labs: No results for input(s): PROCALCITON, LATICACIDVEN in the last 168 hours.  No results found for this or any previous visit (from the past 240 hour(s)).       Radiology Studies: No results found.      Scheduled Meds: . amLODipine  10 mg Oral Daily  . aspirin  81 mg Oral Daily  . atorvastatin  10 mg Oral q1800  . carvedilol  6.25 mg Oral BID WC  . hydrALAZINE  10 mg Oral TID  . hydrochlorothiazide  25 mg Oral Daily  . insulin aspart  0-15 Units Subcutaneous TID WC  . insulin aspart  0-5 Units Subcutaneous QHS  . insulin glargine  20 Units Subcutaneous Daily  . lisinopril  40 mg Oral Daily  . pantoprazole  40 mg Oral Daily  . [START ON 11/26/2015] potassium chloride SA  20 mEq Oral Daily   Continuous Infusions: . heparin 1,850 Units/hr (11/25/15 1340)     LOS: 1 day      [facility], MD [facility] Pager [phone]  If 7PM-7AM, please contact night-coverage [email or web address] Password [email or web address] 11/25/2015, 5:04 PM

## 2015-11-25 NOTE — Discharge Instructions (Signed)
Information on my medicine - XARELTO (rivaroxaban)  This medication education was reviewed with me or my healthcare representative as part of my discharge preparation.  The pharmacist that spoke with me during my hospital stay was:  Arty Baumgartner, [place]? Xarelto was prescribed to treat blood clots that may have been found in the veins of your legs (deep vein thrombosis) or in your lungs (pulmonary embolism) and to reduce the risk of them occurring again.  What do you need to know about Xarelto? The starting dose is one 15 mg tablet taken TWICE daily with food for the FIRST 21 DAYS then on (enter date)  12/17/2015  the dose is changed to one 20 mg tablet taken ONCE A DAY with your evening meal.  DO NOT stop taking Xarelto without talking to the health care provider who prescribed the medication.  Refill your prescription for 20 mg tablets before you run out.  After discharge, you should have regular check-up appointments with your healthcare provider that is prescribing your Xarelto.  In the future your dose may need to be changed if your kidney function changes by a significant amount.  What do you do if you miss a dose? If you are taking Xarelto TWICE DAILY and you miss a dose, take it as soon as you remember. You may take two 15 mg tablets (total 30 mg) at the same time then resume your regularly scheduled 15 mg twice daily the next day.  If you are taking Xarelto ONCE DAILY and you miss a dose, take it as soon as you remember on the same day then continue your regularly scheduled once daily regimen the next day. Do not take two doses of Xarelto at the same time.   Important Safety Information Xarelto is a blood thinner medicine that can cause bleeding. You should call your healthcare provider right away if you experience any of the following: ? Bleeding from an injury or your nose that does not stop. ? Unusual colored urine (red or dark  brown) or unusual colored stools (red or black). ? Unusual bruising for unknown reasons. ? A serious fall or if you hit your head (even if there is no bleeding).  Some medicines may interact with Xarelto and might increase your risk of bleeding while on Xarelto. To help avoid this, consult your healthcare provider or pharmacist prior to using any new prescription or non-prescription medications, including herbals, vitamins, non-steroidal anti-inflammatory drugs (NSAIDs) and supplements.  This website has more information on Xarelto: [email or web address]

## 2015-11-25 NOTE — Progress Notes (Signed)
Inpatient Diabetes Program Recommendations  AACE/ADA: New Consensus Statement on Inpatient Glycemic Control (2015)  Target Ranges:  Prepandial:   less than 140 mg/dL      Peak postprandial:   less than 180 mg/dL (1-2 hours)      Critically ill patients:  140 - 180 mg/dL   Lab Results  Component Value Date   GLUCAP 275 (H) 11/25/2015   HGBA1C 8.9 (H) 11/24/2015    Review of Glycemic Control Results for KALVIN, SPECKER (MRN [id number]) as of 11/25/2015 10:57  Ref. Range 11/24/2015 06:24 11/24/2015 11:05 11/24/2015 16:39 11/24/2015 21:09 11/25/2015 06:18  Glucose-Capillary Latest Ref Range: 65 - 99 mg/dL 294 (H) 315 (H) 261 (H) 201 (H) 275 (H)   Diabetes history: DM2 Outpatient Diabetes medications: Jardience 10 mg qd+ Glucotrol 5 qd+Metformin 500 mg bid Current orders for Inpatient glycemic control: Lantus 20 units qd +Novolog correction 0-15 units tid + 0-5 units hs  Inpatient Diabetes Program Recommendations:    Noted started on Lantus. If fasting CBGs remain elevated, please consider increase in Lantus to 30 units (0.2 units/kg).  Thank you, Nani Gasser. Jermane Brayboy, RN, MSN, CDE Inpatient Glycemic Control Team Team Pager [phone] (8am-5pm) 11/25/2015 11:00 AM

## 2015-11-25 NOTE — Progress Notes (Signed)
ANTICOAGULATION CONSULT NOTE - Follow Up Consult  Pharmacy Consult for Heparin Indication: pulmonary embolus and DVT  No Known Allergies  Patient Measurements: Height: 5\' 9"  (175.3 cm) Weight: (!) 335 lb 6.4 oz (152.1 kg) IBW/kg (Calculated) : 70.7 Heparin Dosing Weight: 110 kg  Vital Signs: Temp: 98.3 F (36.8 C) (10/09 0551) Temp Source: Oral (10/09 0551) BP: 164/77 (10/09 0551) Pulse Rate: 51 (10/09 0551)  Labs:  Recent Labs  11/23/15 1205 11/24/15 0436 11/24/15 1204 11/25/15 0157  HGB 13.5 12.8*  --  13.0  HCT 42.2 41.3  --  42.0  PLT 229 220  --  233  HEPARINUNFRC  --  0.45 0.49 0.46  CREATININE 1.04  --   --  1.06    Estimated Creatinine Clearance: 101.5 mL/min (by C-G formula based on SCr of 1.06 mg/dL).  Assessment:   65 yr old male on IV heparin for PE and left leg DVT.  Mobile thrombus noted per venous duplex report.  Discussed with Dr. Algis Liming.  IV heparin begun at ~3:30pm on 11/23/15 at [facility], then held <30 minutes later to draw hypercoag panel. Heparin re-initiated on 10/7 ~11pm after transfer to [facility]. I do not see hyppercoag panel in process, so it seems that labs were not drawn prior to transfer to [facility].    Heparin level remains therapeutic (0.46) on 1850 units/hr.     Patient and 1 daughter in the room report that they would like to transition to Xarelto at the appropriate time.  Daughter's husband currently takes Xarelto.     Goal of Therapy:  Heparin level 0.3-0.7 units/ml Monitor platelets by anticoagulation protocol: Yes   Plan:   Continue heparin drip at 1850 units/hr  Daily heparin level and CBC while on heparin.  Follow up for transition to Xarelto, expected on 11/26/15.  Discussed Xarelto dosing strategy, bleeding precautions, and potential drug interactions with patient and daughter.  Arty Baumgartner, [place] Pager: [phone] 11/25/2015,11:44 AM

## 2015-11-26 DIAGNOSIS — I2699 Other pulmonary embolism without acute cor pulmonale: Principal | ICD-10-CM

## 2015-11-26 LAB — BASIC METABOLIC PANEL
Anion gap: 11 (ref 5–15)
BUN: 19 mg/dL (ref 6–20)
CO2: 26 mmol/L (ref 22–32)
Calcium: 8.4 mg/dL — ABNORMAL LOW (ref 8.9–10.3)
Chloride: 101 mmol/L (ref 101–111)
Creatinine, Ser: 1.31 mg/dL — ABNORMAL HIGH (ref 0.61–1.24)
GFR calc Af Amer: >60 mL/min (ref >60)
GFR calc non Af Amer: 56 mL/min — ABNORMAL LOW (ref >60)
Glucose, Bld: 355 mg/dL — ABNORMAL HIGH (ref 65–99)
Potassium: 3.2 mmol/L — ABNORMAL LOW (ref 3.5–5.1)
Sodium: 138 mmol/L (ref 135–145)

## 2015-11-26 LAB — GLUCOSE, CAPILLARY
Glucose-Capillary: 276 mg/dL — ABNORMAL HIGH (ref 65–99)
Glucose-Capillary: 220 mg/dL — ABNORMAL HIGH (ref 65–99)

## 2015-11-26 LAB — CBC
HCT: 40.5 % (ref 39.0–52.0)
Hemoglobin: 12.4 g/dL — ABNORMAL LOW (ref 13.0–17.0)
MCH: 26.3 pg (ref 26.0–34.0)
MCHC: 30.6 g/dL (ref 30.0–36.0)
MCV: 85.8 fL (ref 78.0–100.0)
Platelets: 228 10*3/uL (ref 150–400)
RBC: 4.72 MIL/uL (ref 4.22–5.81)
RDW: 13.6 % (ref 11.5–15.5)
WBC: 8.8 10*3/uL (ref 4.0–10.5)

## 2015-11-26 LAB — HEPARIN LEVEL (UNFRACTIONATED): Heparin Unfractionated: 0.74 IU/mL — ABNORMAL HIGH (ref 0.30–0.70)

## 2015-11-26 MED ORDER — RIVAROXABAN 15 MG PO TABS
15.0000 mg | ORAL_TABLET | Freq: Two times a day (BID) | ORAL | Status: DC
  Administered 2015-11-26: 15 mg via ORAL
  Filled 2015-11-26: qty 1

## 2015-11-26 MED ORDER — RIVAROXABAN (XARELTO) EDUCATION KIT FOR DVT/PE PATIENTS
PACK | Freq: Once | Status: AC
Start: 2015-11-26 — End: 2015-11-26
  Administered 2015-11-26: 12:00:00
  Filled 2015-11-26: qty 1

## 2015-11-26 MED ORDER — POTASSIUM CHLORIDE CRYS ER 20 MEQ PO TBCR: 20.0000 meq | EXTENDED_RELEASE_TABLET | Freq: Every day | ORAL | 0 refills | Status: DC

## 2015-11-26 MED ORDER — RIVAROXABAN (XARELTO) VTE STARTER PACK (15 & 20 MG): ORAL_TABLET | ORAL | 0 refills | Status: DC

## 2015-11-26 MED FILL — metFORMIN HCL 500 MG TABS: 500 | 30 days supply | Qty: 60 | Fill #1

## 2015-11-26 MED FILL — KLOR-CON M20 TABLET: 20 | 3 days supply | Qty: 3 | Fill #0

## 2015-11-26 MED FILL — XARELTO STARTER PACK: 15 & 20 | 26 days supply | Qty: 51 | Fill #0

## 2015-11-26 NOTE — Progress Notes (Signed)
ANTICOAGULATION CONSULT NOTE - Follow Up Consult  Pharmacy Consult for Heparin Indication: pulmonary embolus and DVT  No Known Allergies  Patient Measurements: Height: 5\' 9"  (175.3 cm) Weight: (!) 335 lb 6.4 oz (152.1 kg) IBW/kg (Calculated) : 70.7 Heparin Dosing Weight: 110 kg  Vital Signs: Temp: 98.7 F (37.1 C) (10/09 1948) Temp Source: Oral (10/09 1948) BP: 144/53 (10/09 1948) Pulse Rate: 69 (10/09 2030)  Labs:  Recent Labs  11/23/15 1205  11/24/15 0436 11/24/15 1204 11/25/15 0157 11/26/15 0222  HGB 13.5  --  12.8*  --  13.0 12.4*  HCT 42.2  --  41.3  --  42.0 40.5  PLT 229  --  220  --  233 228  HEPARINUNFRC  --   < > 0.45 0.49 0.46 0.74*  CREATININE 1.04  --   --   --  1.06  --   < > = values in this interval not displayed.  Estimated Creatinine Clearance: 101.5 mL/min (by C-G formula based on SCr of 1.06 mg/dL).  Assessment: 65 yr old male on IV heparin for PE and left leg DVT.  Mobile thrombus noted per venous duplex report.  Discussed with Dr. Algis Liming.  IV heparin begun at ~3:30pm on 11/23/15 at [facility], then held <30 minutes later to draw hypercoag panel. Heparin re-initiated on 10/7 ~11pm after transfer to [facility]. I do not see hyppercoag panel in process, so it seems that labs were not drawn prior to transfer to [facility].     Heparin level now slightly supratherapeutic (0.74) on 1850 units/hr.     Goal of Therapy:  Heparin level 0.3-0.7 units/ml Monitor platelets by anticoagulation protocol: Yes   Plan:  Decrease heparin drip to 1750 units/hr Will f/u 6 hr heparin level if not transitioned to Xarelto Follow up for transition to Xarelto, expected on 11/26/15.  Sherlon Handing, PharmD, BCPS Clinical pharmacist, pager [phone] 11/26/2015,3:36 AM

## 2015-11-26 NOTE — Progress Notes (Addendum)
Xarelto check attempted provider would not give info without speaking with pt- # given to pt to call-  Per pt- insurance called and copay $120/mo Pt states he is ok with this amount.

## 2015-11-26 NOTE — Progress Notes (Signed)
Patient discharged home with his wife. IV was dc'd and was intact. Education was given on starting eliquis. He stated that he understood the medication schedule and his discharge instructions.

## 2015-11-26 NOTE — Progress Notes (Signed)
ANTICOAGULATION CONSULT NOTE - Follow Up Consult  Pharmacy Consult for Heparin - Xarelto Indication: pulmonary embolus and DVT  No Known Allergies  Patient Measurements: Height: 5\' 9"  (175.3 cm) Weight: (!) 327 lb 8 oz (148.6 kg) IBW/kg (Calculated) : 70.7 Heparin Dosing Weight: 110 kg  Vital Signs: Temp: 98.4 F (36.9 C) (10/10 0514) Temp Source: Oral (10/10 0514) BP: 170/85 (10/10 1028) Pulse Rate: 72 (10/10 0514)  Labs:  Recent Labs  11/23/15 1205  11/24/15 0436 11/24/15 1204 11/25/15 0157 11/26/15 0222  HGB 13.5  --  12.8*  --  13.0 12.4*  HCT 42.2  --  41.3  --  42.0 40.5  PLT 229  --  220  --  233 228  HEPARINUNFRC  --   < > 0.45 0.49 0.46 0.74*  CREATININE 1.04  --   --   --  1.06 1.31*  < > = values in this interval not displayed.  Estimated Creatinine Clearance: 81 mL/min (by C-G formula based on SCr of 1.31 mg/dL (H)).  Assessment:   65 yr old male on IV heparin for PE and left leg DVT.   Heparin drip rate had been adjusted early this am when level was just above goal.  Now to transition from IV heparin to Xarelto.   Discussed with patient and wife.     Goal of Therapy:  Heparin level 0.3-0.7 units/ml Monitor platelets by anticoagulation protocol: Yes   Plan:   Xarelto 15 mg PO BID x 21 days, then 20 mg daily with supper.  Stop IV heparin when giving first dose of Xarelto.   Xarelto starter pack to be prescribed at discharge.  Arty Baumgartner, [place] Pager: [phone] 11/26/2015,11:19 AM

## 2015-11-26 NOTE — Care Management Note (Signed)
Case Management Note Marvetta Gibbons RN, BSN Unit 2W-Case Manager [phone]  Patient Details  Name: GEOFFERY AULTMAN MRN: [id number] Date of Birth: [birth year]  Subjective/Objective:  Pt admitted with PE/DVT- plan to start Xarelto                  Action/Plan: PTA pt lived at home with spouse- plan to return home- referral for Xarelto needs- attempted to do insurance check- however pt's provider would not give copay info without first speaking with pt- spoke with pt and wife at bedside- number given to pt to call provider himself to check on his copay cost- pt uses [facility]- which has starter kit instock- education kit has been ordered to bedside which includes a 30 day free card - pt and wife have been informed about the 30 day free card.   Expected Discharge Date:     11/26/15             Expected Discharge Plan:  Home/Self Care  In-House Referral:     Discharge planning Services  CM Consult  Post Acute Care Choice:    Choice offered to:     DME Arranged:    DME Agency:     HH Arranged:    HH Agency:     Status of Service:  Completed, signed off  If discussed at [employer] of Stay Meetings, dates discussed:    Additional Comments:  Dawayne Patricia, RN 11/26/2015, 11:52 AM

## 2015-11-26 NOTE — Discharge Summary (Signed)
Physician Discharge Summary  Thomas [name] [id number] DOB: 1950/12/14 DOA: 11/23/2015  PCP: [name], NP  Admit date: 11/23/2015 Discharge date: 11/26/2015  Admitted From: Home  Disposition:  Home   Recommendations for Outpatient Follow-up:  1. Follow up with PCP in 1-2 weeks 2. Please obtain BMP/CBC in one week 3. Needs referral to hematologist  4. Cholelithiasis and benign left adrenal adenoma, noted on CT - Outpatient follow-up   Discharge Condition: Stable.  CODE STATUS: Full code.  Diet recommendation: Heart Healthy   Brief/Interim Summary: 65 year old male, married, independent of activities of daily living, PMH of DM 2, HTN, chronic kidney disease, prostate cancer status post seed implantation (Dr.Mark Nesi), no personal history of VTE, positive family history of PE in his daughter (treated with Xarelto) and nephew (brother's son), had ongoing cough for the last 3-4 weeks, did not respond adequately to treatment for acute bronchitis, noted worsening dyspnea on minimal exertion and ongoing cough, presented to ED 10/7 where CTA chest confirmed PE without evidence of right heart strain. Admitted to the hospital and started on IV heparin drip. Plan is to continue IV heparin drip until tomorrow (11/26/15), then transition to Xarelto at discharge.   Assessment & Plan:  Acute pulmonary embolism and left lower extremity DVT - No clear provoking event. - Hemodynamically stable. No right heart strain on CT. - Patient was started on IV heparin infusion at approximately 11 PM on 11/23/15. Continue IV heparin infusion until 11/26/15 which would be approximately 48 hours. At that time can transition to PO Xarelto at DC. - I discussed extensively with patient and his family at bedside yesterday and today regarding options and risks and benefits of warfarin versus NOAC's. Patient wishes to go with Xarelto. - At least 6 months of anticoagulation & may be longer> to be  determined during outpatient follow-up. - Recommend outpatient evaluation by hematology for hypercoagulable workup. - Lower extremity venous Doppler results as below but showed extensive left lower extremity DVT and the thrombus in the left common femoral vein and saphenofemoral junction is mobile. Dr Algis Liming  discussed with IR  and Radiology & CCM the case. Since patient has moderate clot burden without right heart strain, would not place an IVC filter and would continue with anticoagulation alone at this time.. - 2-D echo: LVEF 55-60 percent and grade 2 diastolic dysfunction. Normal RV systolic function. - Can DC aspirin 81 MG daily (taking for primary prophylaxis) when Xarelto is started. -discharge on xarelto.   Essential hypertension - Mildly uncontrolled. Continue amlodipine, carvedilol, hydralazine, lisinopril. Hold HCTZ due to mild increase cr.  DM type II - Uncontrolled. Due to uncontrolled CBGs, increased Lantus from 15 units to 20 units daily. A1c 8.9 suggesting poor outpatient control. Add mealtime NovoLog to SSI. May consider discharging home on insulin's.  Hypokalemia - Replace aggressively and follow. Magnesium 2.  Morbid obesity/Body mass index is 49.53 kg/m. - Eventually will need diet, exercise and weight loss.  Prostate cancer status post seed - Outpatient follow-up with urology to determine if this is still in remission. Apparently self catheterizes due to obstructive uropathy.  Stage II chronic kidney disease - Creatinine is normal  Cholelithiasis and benign left adrenal adenoma, noted on CT - Outpatient follow-up  Discharge Diagnoses:  Active Problems:   Pulmonary embolism (HCC) Left LE DVT DM HTN    Discharge Instructions  Discharge Instructions    Diet - low sodium heart healthy    Complete by:  As directed  Increase activity slowly    Complete by:  As directed        Medication List    STOP taking these medications   aspirin EC 81  MG tablet   hydrochlorothiazide 25 MG tablet Commonly known as:  HYDRODIURIL     TAKE these medications   amLODipine 10 MG tablet Commonly known as:  NORVASC Take 1 tablet (10 mg total) by mouth daily. For high blood pressure What changed:  when to take this  additional instructions   atorvastatin 10 MG tablet Commonly known as:  LIPITOR Take one tablet by mouth once daily for cholesterol What changed:  how much to take  how to take this  when to take this  additional instructions   carvedilol 6.25 MG tablet Commonly known as:  COREG Take 1 tablet (6.25 mg total) by mouth 2 (two) times daily with a meal. For high blood pressure   glipiZIDE 5 MG tablet Commonly known as:  GLUCOTROL Take 5 mg by mouth daily before breakfast.   hydrALAZINE 10 MG tablet Commonly known as:  APRESOLINE Take 1 tablet (10 mg total) by mouth 3 (three) times daily.   HYDROcodone-homatropine 5-1.5 MG/5ML syrup Commonly known as:  HYCODAN Take 5 mLs by mouth every 6 (six) hours as needed for cough.   JARDIANCE 10 MG Tabs tablet Generic drug:  empagliflozin Take 10 mg by mouth daily.   lisinopril 40 MG tablet Commonly known as:  PRINIVIL,ZESTRIL Take 1 tablet (40 mg total) by mouth daily. For high blood pressure   metFORMIN 500 MG tablet Commonly known as:  GLUCOPHAGE TAKE 1 TABLET BY MOUTH 2 TIMES A DAY WITH A MEAL for diabetes   omeprazole 20 MG capsule Commonly known as:  PRILOSEC Take 1 capsule (20 mg total) by mouth as needed. What changed:  when to take this  reasons to take this   POLY-IRON 150 150 MG capsule Generic drug:  iron polysaccharides TAKE 1 CAPSULE BY MOUTH 2 TIMES DAILY.   potassium chloride SA 20 MEQ tablet Commonly known as:  K-DUR,KLOR-CON Take 1 tablet (20 mEq total) by mouth daily.   Rivaroxaban 15 & 20 MG Tbpk Take as directed on package: Start with one 15mg  tablet by mouth twice a day with food. On Day 22, switch to one 20mg  tablet once a day  with food.   traMADol 50 MG tablet Commonly known as:  ULTRAM Take 1 tablet (50 mg total) by mouth every 8 (eight) hours as needed. for pain What changed:  reasons to take this  additional instructions   TRUE METRIX BLOOD GLUCOSE TEST test strip Generic drug:  glucose blood CHECK BLOOD SUGAR TWICE PER DAY   TRUEPLUS LANCETS 30G Misc USE TO TEST BLOOD SUGAR 2 TIMES A DAY   vitamin B-12 1000 MCG tablet Commonly known as:  CYANOCOBALAMIN Take 1,000 mcg by mouth daily.       No Known Allergies  Consultations:  CCM, IR    Procedures/Studies: Dg Chest 2 View  Result Date: 11/23/2015 CLINICAL DATA:  Cough and shortness of breath for 2 weeks EXAM: CHEST  2 VIEW COMPARISON:  November 04, 2015 FINDINGS: There is no edema or consolidation. Heart size is normal. Prominence of the central pulmonary vessels with rapid peripheral tapering may be indicative of pulmonary arterial hypertension. No adenopathy. There is degenerative change in the thoracic spine. IMPRESSION: No edema or consolidation. Suspect pulmonary arterial hypertension, stable. Electronically Signed   By: Lowella Grip III M.D.  On: 11/23/2015 12:55   Dg Chest 2 View  Result Date: 11/04/2015 CLINICAL DATA:  Cough and shortness breath over the last 45 days. EXAM: CHEST  2 VIEW COMPARISON:  Two-view chest x-ray 07/11/2014.  CTA chest 12/06/2009. FINDINGS: Heart size is normal. Bilateral hilar prominence is evident. There is no focal airspace disease. There is no edema or effusion to suggest failure. The visualized soft tissues and bony thorax are unremarkable. IMPRESSION: 1. Low lung volumes and bilateral hilar prominence without significant interval change. This is likely related to pulmonary arterial hypertension the previously noted prominence of the pulmonary arteries. 2. No acute cardiopulmonary disease. Electronically Signed   By: San Morelle M.D.   On: 11/04/2015 13:17   Ct Angio Chest Pe W/cm &/or Wo  Cm  Result Date: 11/23/2015 CLINICAL DATA:  Worsening shortness of breath for 2 weeks. Cough and lower extremity pain. Clinical suspicion for pulmonary embolism. EXAM: CT ANGIOGRAPHY CHEST WITH CONTRAST TECHNIQUE: Multidetector CT imaging of the chest was performed using the standard protocol during bolus administration of intravenous contrast. Multiplanar CT image reconstructions and MIPs were obtained to evaluate the vascular anatomy. CONTRAST:  100 mL Isovue 370 COMPARISON:  Chest CTA on 12/06/2009 FINDINGS: Cardiovascular: Pulmonary embolism is seen within the distal right pulmonary artery as well as the right upper, middle, and lower lobar pulmonary artery branches. No definite left sided pulmonary emboli identified. Mild cardiomegaly noted without evidence of pericardial effusion. RV/LV ratio is 0.77, which is within normal limits. Mediastinum/Nodes: No masses or pathologically enlarged lymph nodes identified. Lungs/Pleura: No pulmonary mass, infiltrate, or effusion. Upper abdomen: 3.5 cm calcified gallstone, without evidence of cholecystitis. 2.4 cm left adrenal mass which was seen on abdomen CT in 2009, consistent with benign adrenal adenoma. Musculoskeletal: No suspicious bone lesions or other significant abnormality identified. IMPRESSION: Pulmonary embolism within distal right pulmonary artery and all right lobar pulmonary artery branches. No CT evidence of right heart strain. Cholelithiasis and benign left adrenal adenoma again noted. Critical Value/emergent results were called by telephone at the time of interpretation on 11/23/2015 at 2:25 pm to Dr. Regenia Skeeter, who verbally acknowledged these results. Electronically Signed   By: Earle Gell M.D.   On: 11/23/2015 14:27       Subjective: Feeling well, wants to go home  Discharge Exam: Vitals:   11/26/15 0514 11/26/15 1028  BP: (!) 174/81 (!) 170/85  Pulse: 72   Resp: 19   Temp: 98.4 F (36.9 C)    Vitals:   11/25/15 1948 11/25/15 2030  11/26/15 0514 11/26/15 1028  BP: (!) 144/53  (!) 174/81 (!) 170/85  Pulse: 73 69 72   Resp: 19  19   Temp: 98.7 F (37.1 C)  98.4 F (36.9 C)   TempSrc: Oral  Oral   SpO2: 93%  96%   Weight:   (!) 148.6 kg (327 lb 8 oz)   Height:        General: Pt is alert, awake, not in acute distress Cardiovascular: RRR, S1/S2 +, no rubs, no gallops Respiratory: CTA bilaterally, no wheezing, no rhonchi Abdominal: Soft, NT, ND, bowel sounds + Extremities: no edema, no cyanosis    The results of significant diagnostics from this hospitalization (including imaging, microbiology, ancillary and laboratory) are listed below for reference.     Microbiology: No results found for this or any previous visit (from the past 240 hour(s)).   Labs: BNP (last 3 results) No results for input(s): BNP in the last 8760 hours. Basic Metabolic Panel:  Recent Labs Lab 11/23/15 1205 11/25/15 0153 11/25/15 0157 11/26/15 0222  NA 138  --  139 138  K 3.2*  --  2.9* 3.2*  CL 101  --  100* 101  CO2 28  --  29 26  GLUCOSE 352*  --  195* 355*  BUN 19  --  12 19  CREATININE 1.04  --  1.06 1.31*  CALCIUM 8.8*  --  8.4* 8.4*  MG  --  2.0  --   --    Liver Function Tests: No results for input(s): AST, ALT, ALKPHOS, BILITOT, PROT, ALBUMIN in the last 168 hours. No results for input(s): LIPASE, AMYLASE in the last 168 hours. No results for input(s): AMMONIA in the last 168 hours. CBC:  Recent Labs Lab 11/23/15 1205 11/24/15 0436 11/25/15 0157 11/26/15 0222  WBC 7.7 8.3 8.1 8.8  NEUTROABS 5.6  --   --   --   HGB 13.5 12.8* 13.0 12.4*  HCT 42.2 41.3 42.0 40.5  MCV 83.1 85.3 85.4 85.8  PLT 229 220 233 228   Cardiac Enzymes: No results for input(s): CKTOTAL, CKMB, CKMBINDEX, TROPONINI in the last 168 hours. BNP: Invalid input(s): POCBNP CBG:  Recent Labs Lab 11/25/15 1627 11/25/15 1714 11/25/15 2051 11/26/15 0646 11/26/15 1047  GLUCAP 252* 265* 247* 276* 220*   D-Dimer No results for  input(s): DDIMER in the last 72 hours. Hgb A1c  Recent Labs  11/24/15 1300  HGBA1C 8.9*   Lipid Profile No results for input(s): CHOL, HDL, LDLCALC, TRIG, CHOLHDL, LDLDIRECT in the last 72 hours. Thyroid function studies No results for input(s): TSH, T4TOTAL, T3FREE, THYROIDAB in the last 72 hours.  Invalid input(s): FREET3 Anemia work up No results for input(s): VITAMINB12, FOLATE, FERRITIN, TIBC, IRON, RETICCTPCT in the last 72 hours. Urinalysis    Component Value Date/Time   COLORURINE YELLOW 11/07/2015 1110   APPEARANCEUR CLEAR 11/07/2015 1110   LABSPEC 1.039 (H) 11/07/2015 1110   PHURINE 6.0 11/07/2015 1110   GLUCOSEU >1000 (A) 11/07/2015 1110   HGBUR NEGATIVE 11/07/2015 1110   BILIRUBINUR NEGATIVE 11/07/2015 1110   BILIRUBINUR small 10/11/2014 1152   KETONESUR 15 (A) 11/07/2015 1110   PROTEINUR 30 (A) 11/07/2015 1110   UROBILINOGEN 0.2 10/11/2014 1152   UROBILINOGEN 0.2 05/19/2014 1412   NITRITE NEGATIVE 11/07/2015 1110   LEUKOCYTESUR SMALL (A) 11/07/2015 1110   Sepsis Labs Invalid input(s): PROCALCITONIN,  WBC,  LACTICIDVEN Microbiology No results found for this or any previous visit (from the past 240 hour(s)).   Time coordinating discharge: Over 30 minutes  SIGNED:   Elmarie Shiley, MD  [facility] 11/26/2015, 1:06 PM Pager   If 7PM-7AM, please contact night-coverage [email or web address] Password [email or web address]

## 2015-11-26 NOTE — Progress Notes (Signed)
Patient is at 96% on room air and tolerating it well. Will continue to monitor.

## 2015-12-02 ENCOUNTER — Encounter: Payer: Medicare Other | Admitting: Nurse Practitioner

## 2015-12-03 ENCOUNTER — Encounter: Payer: Medicare Other | Admitting: Nurse Practitioner

## 2015-12-04 ENCOUNTER — Encounter (HOSPITAL_BASED_OUTPATIENT_CLINIC_OR_DEPARTMENT_OTHER): Payer: Self-pay | Admitting: *Deleted

## 2015-12-04 ENCOUNTER — Emergency Department (HOSPITAL_BASED_OUTPATIENT_CLINIC_OR_DEPARTMENT_OTHER)
Admission: EM | Admit: 2015-12-04 | Discharge: 2015-12-04 | Disposition: A | Discharge status: Home / Self Care | Payer: Medicare Other | Attending: Emergency Medicine | Admitting: Emergency Medicine

## 2015-12-04 DIAGNOSIS — R319 Hematuria, unspecified: Secondary | ICD-10-CM | POA: Diagnosis not present

## 2015-12-04 DIAGNOSIS — E119 Type 2 diabetes mellitus without complications: Secondary | ICD-10-CM | POA: Diagnosis not present

## 2015-12-04 DIAGNOSIS — Z8546 Personal history of malignant neoplasm of prostate: Secondary | ICD-10-CM | POA: Diagnosis not present

## 2015-12-04 DIAGNOSIS — I10 Essential (primary) hypertension: Secondary | ICD-10-CM | POA: Diagnosis not present

## 2015-12-04 DIAGNOSIS — Z79899 Other long term (current) drug therapy: Secondary | ICD-10-CM | POA: Insufficient documentation

## 2015-12-04 DIAGNOSIS — Z7984 Long term (current) use of oral hypoglycemic drugs: Secondary | ICD-10-CM | POA: Insufficient documentation

## 2015-12-04 LAB — BASIC METABOLIC PANEL
Anion gap: 9 (ref 5–15)
BUN: 17 mg/dL (ref 6–20)
CO2: 24 mmol/L (ref 22–32)
Calcium: 8.8 mg/dL — ABNORMAL LOW (ref 8.9–10.3)
Chloride: 106 mmol/L (ref 101–111)
Creatinine, Ser: 1.17 mg/dL (ref 0.61–1.24)
GFR calc Af Amer: >60 mL/min (ref >60)
GFR calc non Af Amer: >60 mL/min (ref >60)
Glucose, Bld: 307 mg/dL — ABNORMAL HIGH (ref 65–99)
Potassium: 3.1 mmol/L — ABNORMAL LOW (ref 3.5–5.1)
Sodium: 139 mmol/L (ref 135–145)

## 2015-12-04 LAB — URINALYSIS, ROUTINE W REFLEX MICROSCOPIC
Bilirubin Urine: NEGATIVE
Glucose, UA: >1000 mg/dL — AB
Ketones, ur: NEGATIVE mg/dL
Nitrite: NEGATIVE
Protein, ur: 100 mg/dL — AB
Specific Gravity, Urine: 1.036 — ABNORMAL HIGH (ref 1.005–1.030)
pH: 6 (ref 5.0–8.0)

## 2015-12-04 LAB — CBC WITH DIFFERENTIAL/PLATELET
Basophils Absolute: 0 10*3/uL (ref 0.0–0.1)
Basophils Relative: 0 %
Eosinophils Absolute: 0 10*3/uL (ref 0.0–0.7)
Eosinophils Relative: 1 %
HCT: 39.9 % (ref 39.0–52.0)
Hemoglobin: 13 g/dL (ref 13.0–17.0)
Lymphocytes Relative: 17 %
Lymphs Abs: 1.4 10*3/uL (ref 0.7–4.0)
MCH: 27 pg (ref 26.0–34.0)
MCHC: 32.6 g/dL (ref 30.0–36.0)
MCV: 82.8 fL (ref 78.0–100.0)
Monocytes Absolute: 0.5 10*3/uL (ref 0.1–1.0)
Monocytes Relative: 6 %
Neutro Abs: 6.6 10*3/uL (ref 1.7–7.7)
Neutrophils Relative %: 76 %
Platelets: 289 10*3/uL (ref 150–400)
RBC: 4.82 MIL/uL (ref 4.22–5.81)
RDW: 14.1 % (ref 11.5–15.5)
WBC: 8.6 10*3/uL (ref 4.0–10.5)

## 2015-12-04 LAB — URINE MICROSCOPIC-ADD ON

## 2015-12-04 LAB — PROTIME-INR
INR: 1.4
Prothrombin Time: 17.3 seconds — ABNORMAL HIGH (ref 11.4–15.2)

## 2015-12-04 NOTE — ED Provider Notes (Signed)
[facility] DEPT [facility] Provider Note   CSN: [id number] Arrival date & time: 12/04/15  0759     History   Chief Complaint Chief Complaint  Patient presents with  . Hematuria    HPI Thomas [name] is a 65 y.o. male.  HPI Patient presents to emergency room with history of gross hematuria for the last 2 days.  Patient recently was admitted with a diagnosis of deep venous thrombosis and pulmonary embolism.  Was started on Xar[place]to.  Patient does self-catheterization because of prostate cancer many years ago.  He does have radioactive seeds in prostate.  He's never had problems with hematuria before.  Denies other bleeding problems.  Diagnosis Date  . Arthritis   . Cancer [facility]) 2010   Prostate  . Chronic kidney disease   . Diabetes mellitus   . Hypertension     Patient Active Problem List   Diagnosis Date Noted  . Pulmonary embolism ([place]) 11/23/2015  . Diabetes mellitus ([place]) 12/05/2014  . Anemia, iron deficiency 11/22/2013  . Malignant neoplasm of prostate ([place]) 07/04/2013  . Severe obesity (BMI >= 40) ([place]) 05/23/2013  . B12 deficiency 05/23/2013  . Hyperlipidemia with target LDL less than 100 09/02/2012  . HTN (hypertension) 05/26/2012  . Osteoarthritis, knee 05/26/2012  . MORBID OBESITY 08/22/2008    Past Surgical History:  Procedure Laterality Date  . HERNIA REPAIR    . KNEE SURGERY    . PROSTATE SURGERY    . RADIOACTIVE SEED IMPLANT    . SHOULDER SURGERY    . uretha surgery-2014         Home Medications    Prior to Admission medications   Medication Sig Start Date End Date Taking? Authorizing Provider  amLODipine (NORVASC) 10 MG tablet Take 1 tablet (10 mg total) by mouth daily. For high blood pressure Patient taking differently: Take 10 mg by mouth at bedtime. For high blood pressure 11/04/15   [name], NP  atorvastatin (LIPITOR) 10 MG tablet Take one tablet by mouth once daily for cholesterol Patient taking differently: Take 10 mg by  mouth at bedtime. for cholesterol 09/30/15   [name], NP  carvedilol (COREG) 6.25 MG tablet Take 1 tablet (6.25 mg total) by mouth 2 (two) times daily with a meal. For high blood pressure 11/04/15   [name], NP  empagliflozin (JARDIANCE) 10 MG TABS tablet Take 10 mg by mouth daily.    Historical Provider, MD  glipiZIDE (GLUCOTROL) 5 MG tablet Take 5 mg by mouth daily before breakfast. 09/30/15   Historical Provider, MD  hydrALAZINE (APRESOLINE) 10 MG tablet Take 1 tablet (10 mg total) by mouth 3 (three) times daily. 10/24/15   [name], NP  HYDROcodone-homatropine (HYCODAN) 5-1.5 MG/5ML syrup Take 5 mLs by mouth every 6 (six) hours as needed for cough. Patient not taking: Reported on 11/24/2015 11/07/15   Hyman Bible, PA-C  lisinopril (PRINIVIL,ZESTRIL) 40 MG tablet Take 1 tablet (40 mg total) by mouth daily. For high blood pressure 09/30/15   [name], NP  metFORMIN (GLUCOPHAGE) 500 MG tablet TAKE 1 TABLET BY MOUTH 2 TIMES A DAY WITH A MEAL for diabetes 09/30/15   [name], NP  omeprazole (PRILOSEC) 20 MG capsule Take 1 capsule (20 mg total) by mouth as needed. Patient taking differently: Take 20 mg by mouth daily as needed (acid reflux/ heartburn).  05/24/14   [name], NP  POLY-IRON 150 150 MG capsule TAKE 1 CAPSULE BY MOUTH 2  TIMES DAILY. 03/28/15   Tiffany L Reed, DO  potassium chloride SA (K-DUR,KLOR-CON) 20 MEQ tablet Take 1 tablet (20 mEq total) by mouth daily. 11/26/15 11/29/15  Belkys A Regalado, MD  Rivaroxaban 15 & 20 MG TBPK Take as directed on package: Start with one 15mg  tablet by mouth twice a day with food. On Day 22, switch to one 20mg  tablet once a day with food. 11/26/15   Belkys A Regalado, MD  traMADol (ULTRAM) 50 MG tablet Take 1 tablet (50 mg total) by mouth every 8 (eight) hours as needed. for pain Patient taking differently: Take 50 mg by mouth every 8 (eight) hours as needed (pain).  09/30/15   [name], NP  TRUE METRIX  BLOOD GLUCOSE TEST test strip CHECK BLOOD SUGAR TWICE PER DAY 10/24/15   [name], NP  TRUEPLUS LANCETS 30G MISC USE TO TEST BLOOD SUGAR 2 TIMES A DAY 09/12/14   [name], NP  vitamin B-12 (CYANOCOBALAMIN) 1000 MCG tablet Take 1,000 mcg by mouth daily.    Historical Provider, MD    Family History No family history on file.  Social History Social History  Substance Use Topics  . Smoking status: Never Smoker  . Smokeless tobacco: Never Used  . Alcohol use No     Comment: never     Allergies   Review of patient's allergies indicates no known allergies.   Review of Systems Review of Systems All other systems reviewed and are negative  Physical Exam Updated Vital Signs BP 150/68 (BP Location: Right Arm)   Pulse 65   Temp 97.6 F (36.4 C) (Oral)   Resp 18   Ht 5\' 9"  (1.753 m)   Wt (!) 330 lb (149.7 kg)   SpO2 97%   BMI 48.73 kg/m   Physical Exam Physical Exam  Nursing note and vitals reviewed. Constitutional: He is oriented to person, place, and time. He appears well-developed and well-nourished. No distress.  HENT:  Head: Normocephalic and atraumatic.  Eyes: Pupils are equal, round, and reactive to light.  Neck: Normal range of motion.  Cardiovascular: Normal rate and intact distal pulses.   Pulmonary/Chest: No respiratory distress.  Abdominal: Normal appearance. He exhibits no distension.  Musculoskeletal: Normal range of motion.  Neurological: He is alert and oriented to person, place, and time. No cranial nerve deficit.  Skin: Skin is warm and dry. No rash noted.  Psychiatric: He has a normal mood and affect. His behavior is normal.    ED Treatments / Results  Labs (all labs ordered are listed, but only abnormal results are displayed) Labs Reviewed  URINALYSIS, ROUTINE W REFLEX MICROSCOPIC (NOT AT [facility]) - Abnormal; Notable for the following:       Result Value   Color, Urine RED (*)    APPearance CLOUDY (*)    Specific Gravity, Urine 1.036  (*)    Glucose, UA >1000 (*)    Hgb urine dipstick LARGE (*)    Protein, ur 100 (*)    Leukocytes, UA MODERATE (*)    All other components within normal limits  URINE MICROSCOPIC-ADD ON - Abnormal; Notable for the following:    Squamous Epithelial / LPF 0-5 (*)    Bacteria, UA FEW (*)    Casts GRANULAR CAST (*)    All other components within normal limits  URINE CULTURE  CBC WITH DIFFERENTIAL/PLATELET  BASIC METABOLIC PANEL  PROTIME-INR    EKG  EKG Interpretation None       Radiology  No results found.  Procedures Procedures (including critical care time)  Medications Ordered in ED Medications - No data to display   Initial Impression / Assessment and Plan / ED Course  I have reviewed the triage vital signs and the nursing notes.  Pertinent labs & imaging results that were available during my care of the patient were reviewed by me and considered in my medical decision making (see chart for details).  Clinical Course   I discussed the case with the urologist on-call who recommended patient continue to self catheter himself and follow-up with his primary urologist here in [facility].  Final Clinical Impressions(s) / ED Diagnoses   Final diagnoses:  Hematuria    New Prescriptions New Prescriptions   No medications on file     Leonard Schwartz, MD 12/04/15 1049

## 2015-12-04 NOTE — ED Triage Notes (Signed)
Patient states for the last three days, he has had blood in his urine.  Patient self caths for urine output.  States he was diagnosed with Left DVT one week ago and started on [place].

## 2015-12-04 NOTE — Discharge Instructions (Signed)
Call your urologist for a follow-up appointment as soon as possible.  Continue to self-cath yourself

## 2015-12-05 ENCOUNTER — Encounter: Payer: Medicare Other | Admitting: Nurse Practitioner

## 2015-12-05 LAB — URINE CULTURE
Culture: 70000 — AB
Special Requests: NORMAL

## 2015-12-06 ENCOUNTER — Telehealth (HOSPITAL_BASED_OUTPATIENT_CLINIC_OR_DEPARTMENT_OTHER): Payer: Self-pay | Admitting: *Deleted

## 2015-12-06 NOTE — Telephone Encounter (Signed)
Post ED Visit - Positive Culture Follow-up  Culture report reviewed by antimicrobial stewardship pharmacist:  []  Elenor Quinones, Pharm.D. []  Heide Guile, Pharm.D., BCPS []  Parks Neptune, Pharm.D. []  Alycia Rossetti, Pharm.D., BCPS []  [place], Pharm.D., BCPS, AAHIVP []  Legrand Como, Pharm.D., BCPS, AAHIVP []  Milus Glazier, Pharm.D. []  Stephens November, Pharm.D.   Positive urine culture No further patient follow-up is required at this time.  Recommended at time of visit to follow up with his urologist.  Ardeen Fillers 12/06/2015, 10:55 AM

## 2015-12-10 ENCOUNTER — Telehealth (HOSPITAL_BASED_OUTPATIENT_CLINIC_OR_DEPARTMENT_OTHER): Payer: Self-pay | Admitting: Emergency Medicine

## 2015-12-10 NOTE — Telephone Encounter (Signed)
Lost to followup 

## 2015-12-12 DIAGNOSIS — R339 Retention of urine, unspecified: Secondary | ICD-10-CM | POA: Diagnosis not present

## 2015-12-12 DIAGNOSIS — R319 Hematuria, unspecified: Secondary | ICD-10-CM | POA: Diagnosis not present

## 2015-12-12 DIAGNOSIS — C61 Malignant neoplasm of prostate: Secondary | ICD-10-CM | POA: Diagnosis not present

## 2015-12-12 DIAGNOSIS — N359 Urethral stricture, unspecified: Secondary | ICD-10-CM | POA: Diagnosis not present

## 2015-12-16 ENCOUNTER — Encounter: Payer: Self-pay | Admitting: Nurse Practitioner

## 2015-12-16 ENCOUNTER — Ambulatory Visit (INDEPENDENT_AMBULATORY_CARE_PROVIDER_SITE_OTHER): Payer: Medicare Other | Admitting: Nurse Practitioner

## 2015-12-16 VITALS — BP 138/88 | HR 81 | Temp 97.5°F | Resp 17 | Ht 69.0 in | Wt 333.0 lb

## 2015-12-16 DIAGNOSIS — C61 Malignant neoplasm of prostate: Secondary | ICD-10-CM

## 2015-12-16 DIAGNOSIS — I10 Essential (primary) hypertension: Secondary | ICD-10-CM

## 2015-12-16 DIAGNOSIS — E1122 Type 2 diabetes mellitus with diabetic chronic kidney disease: Secondary | ICD-10-CM

## 2015-12-16 DIAGNOSIS — I2699 Other pulmonary embolism without acute cor pulmonale: Secondary | ICD-10-CM

## 2015-12-16 DIAGNOSIS — D6859 Other primary thrombophilia: Secondary | ICD-10-CM

## 2015-12-16 DIAGNOSIS — E876 Hypokalemia: Secondary | ICD-10-CM | POA: Diagnosis not present

## 2015-12-16 LAB — BASIC METABOLIC PANEL WITH GFR
BUN: 15 mg/dL (ref 7–25)
CO2: 24 mmol/L (ref 20–31)
Calcium: 8.9 mg/dL (ref 8.6–10.3)
Chloride: 107 mmol/L (ref 98–110)
Creat: 0.93 mg/dL (ref 0.70–1.25)
GFR, Est African American: >89 mL/min (ref >=60)
GFR, Est Non African American: 86 mL/min (ref >=60)
Glucose, Bld: 223 mg/dL — ABNORMAL HIGH (ref 65–99)
Potassium: 3.7 mmol/L (ref 3.5–5.3)
Sodium: 141 mmol/L (ref 135–146)

## 2015-12-16 MED ORDER — RIVAROXABAN 20 MG PO TABS
20.0000 mg | ORAL_TABLET | Freq: Every day | ORAL | 3 refills | Status: DC
Start: 2015-12-16 — End: 2016-01-23

## 2015-12-16 NOTE — Patient Instructions (Addendum)
Will get you set up with home O2 due to decrease in your oxygen level with walking  Start Xarelto 20 mg daily -- on day 22   Please change follow up to 1 month follow up on shortness of breath, PE, diabetes, HTN

## 2015-12-16 NOTE — Progress Notes (Signed)
Careteam: Patient Care Team: [name], NP as PCP - General (Nurse Practitioner)  Advanced Directive information Does patient have an advance directive?: No, Would patient like information on creating an advanced directive?: Yes - Educational materials given  No Known Allergies  Chief Complaint  Patient presents with  . Acute Visit    Wife requested follow up to discuss referral to pulmonology   . Other    Needs to discuss medications: eliquis and jardiance     HPI: Patient is a 65 y.o. male seen in the office today at the request of wife. Pt missed TOC appt.  Pt was hospitalized from 11/23/15-11/26/15 due dyspnea and ongoing cough, CTA to chest confirmed PE without evidence of right heart strain. Admitted to the hospital and started on IV heparin drip. Pt was transition to [place] for discharge. Pt also noted to have left ext DVT without clear provoking event. Recommendation for hematology follow up due to hypercoagulable state.  Found to have hypokalemia, pt was aggressively replaced and needs follow up on labs.  Pt self catheterizes himself and noted to have bloody urine therefore went back to the ED, never had hematuria prior to xarelto state, they recommended urology follow up.  Has already followed up with urologist who noted this may happen with trauma  Pt reports he is still having a lot of trouble breathing with exertion.  No other signs of bleeding  Needs prescription for xarelto 20 mg daily  Was taking lantus and novolog during hospitalization however was discharged back home on jardiance daily, metformin 500 mg BID.  Pt previously on 1000 mg BID but reports increase renal function Increase stress in his life which is why he reports his diabetes is not controlled as it should be. Recent death in the family and increase illness as well  Has not worked out as he should. Reports "the bottom fell out"  Review of Systems:  Review of Systems  Constitutional: Positive  for fatigue. Negative for activity change and chills.  HENT: Negative for sore throat and trouble swallowing.   Eyes: Negative for visual disturbance.  Respiratory: Positive for shortness of breath. Negative for cough, chest tightness and wheezing.   Cardiovascular: Negative for chest pain, palpitations and leg swelling.  Gastrointestinal: Negative for abdominal pain, constipation and diarrhea.  Genitourinary: Positive for difficulty urinating (self catheterizes) and hematuria (comes and goes). Negative for urgency.  Musculoskeletal: Positive for arthralgias. Negative for joint swelling.  Skin: Negative for rash.  Neurological: Negative for weakness and headaches.  Psychiatric/Behavioral: Negative for confusion and sleep disturbance. The patient is not nervous/anxious.     Past Medical History:  Diagnosis Date  . Arthritis   . Cancer [facility]) 2010   Prostate  . Chronic kidney disease   . Diabetes mellitus   . Hypertension    Past Surgical History:  Procedure Laterality Date  . HERNIA REPAIR    . KNEE SURGERY    . PROSTATE SURGERY    . RADIOACTIVE SEED IMPLANT    . SHOULDER SURGERY    . uretha surgery-2014     Social History:   reports that he has never smoked. He has never used smokeless tobacco. He reports that he does not drink alcohol or use drugs.  History reviewed. No pertinent family history.  Medications: Patient's Medications  New Prescriptions   No medications on file  Previous Medications   AMLODIPINE (NORVASC) 10 MG TABLET    Take 1 tablet (10 mg total) by mouth  daily. For high blood pressure   ATORVASTATIN (LIPITOR) 10 MG TABLET    Take one tablet by mouth once daily for cholesterol   CARVEDILOL (COREG) 6.25 MG TABLET    Take 1 tablet (6.25 mg total) by mouth 2 (two) times daily with a meal. For high blood pressure   EMPAGLIFLOZIN (JARDIANCE) 10 MG TABS TABLET    Take 10 mg by mouth daily.   GLIPIZIDE (GLUCOTROL) 5 MG TABLET    Take 5 mg by mouth daily before  breakfast.   HYDRALAZINE (APRESOLINE) 10 MG TABLET    Take 1 tablet (10 mg total) by mouth 3 (three) times daily.   HYDROCODONE-HOMATROPINE (HYCODAN) 5-1.5 MG/5ML SYRUP    Take 5 mLs by mouth every 6 (six) hours as needed for cough.   LISINOPRIL (PRINIVIL,ZESTRIL) 40 MG TABLET    Take 1 tablet (40 mg total) by mouth daily. For high blood pressure   METFORMIN (GLUCOPHAGE) 500 MG TABLET    TAKE 1 TABLET BY MOUTH 2 TIMES A DAY WITH A MEAL for diabetes   OMEPRAZOLE (PRILOSEC) 20 MG CAPSULE    Take 1 capsule (20 mg total) by mouth as needed.   POLY-IRON 150 150 MG CAPSULE    TAKE 1 CAPSULE BY MOUTH 2 TIMES DAILY.   POTASSIUM CHLORIDE SA (K-DUR,KLOR-CON) 20 MEQ TABLET    Take 20 mEq by mouth 2 (two) times daily.   RIVAROXABAN 15 & 20 MG TBPK    Take as directed on package: Start with one '15mg'$  tablet by mouth twice a day with food. On Day 22, switch to one '20mg'$  tablet once a day with food.   TRAMADOL (ULTRAM) 50 MG TABLET    Take 1 tablet (50 mg total) by mouth every 8 (eight) hours as needed. for pain   TRUE METRIX BLOOD GLUCOSE TEST TEST STRIP    CHECK BLOOD SUGAR TWICE PER DAY   TRUEPLUS LANCETS 30G MISC    USE TO TEST BLOOD SUGAR 2 TIMES A DAY   VITAMIN B-12 (CYANOCOBALAMIN) 1000 MCG TABLET    Take 1,000 mcg by mouth daily.  Modified Medications   No medications on file  Discontinued Medications   POTASSIUM CHLORIDE SA (K-DUR,KLOR-CON) 20 MEQ TABLET    Take 1 tablet (20 mEq total) by mouth daily.     Physical Exam:  Vitals:   12/16/15 0841  BP: 138/88  Pulse: 81  Resp: 17  Temp: 97.5 F (36.4 C)  TempSrc: Oral  SpO2: 93%  Weight: (!) 333 lb (151 kg)  Height: '5\' 9"'$  (1.753 m)   Body mass index is 49.18 kg/m.  Physical Exam  Constitutional: He is oriented to person, place, and time. He appears well-developed and well-nourished.  HENT:  Head: Normocephalic and atraumatic.  Right Ear: External ear normal.  Left Ear: External ear normal.  Nose: Nose normal.  Mouth/Throat:  Oropharynx is clear and moist. No oropharyngeal exudate.  Eyes: Conjunctivae are normal. Pupils are equal, round, and reactive to light.  Neck: Neck supple. Carotid bruit is not present.  Cardiovascular: Normal rate, regular rhythm, normal heart sounds and intact distal pulses.   Pulmonary/Chest: Effort normal and breath sounds normal. No respiratory distress. He has no wheezes. He has no rales. He exhibits no tenderness.  Abdominal: He exhibits no abdominal bruit and no pulsatile midline mass.  Musculoskeletal: He exhibits no edema or tenderness.  Neurological: He is alert and oriented to person, place, and time. No sensory deficit.  Skin: Skin is warm and dry.  Psychiatric: He has a normal mood and affect.     Labs reviewed: Basic Metabolic Panel:  Recent Labs  11/25/15 0153 11/25/15 0157 11/26/15 0222 12/04/15 0840  NA  --  139 138 139  K  --  2.9* 3.2* 3.1*  CL  --  100* 101 106  CO2  --  '29 26 24  '$ GLUCOSE  --  195* 355* 307*  BUN  --  '12 19 17  '$ CREATININE  --  1.06 1.31* 1.17  CALCIUM  --  8.4* 8.4* 8.8*  MG 2.0  --   --   --    Liver Function Tests:  Recent Labs  06/28/15 0942 09/30/15 0952 11/01/15 0936  AST '13 10 13  '$ ALT '10 12 10  '$ ALKPHOS 74 98 70  BILITOT 0.5 0.6 0.5  PROT 7.1 7.0 7.2  ALBUMIN 4.1 3.7 3.9   No results for input(s): LIPASE, AMYLASE in the last 8760 hours. No results for input(s): AMMONIA in the last 8760 hours. CBC:  Recent Labs  09/30/15 0952 11/23/15 1205  11/25/15 0157 11/26/15 0222 12/04/15 0840  WBC 7.8 7.7  < > 8.1 8.8 8.6  NEUTROABS 5,616 5.6  --   --   --  6.6  HGB 13.6 13.5  < > 13.0 12.4* 13.0  HCT 41.8 42.2  < > 42.0 40.5 39.9  MCV 83.4 83.1  < > 85.4 85.8 82.8  PLT 263 229  < > 233 228 289  < > = values in this interval not displayed. Lipid Panel:  Recent Labs  03/08/15 0834  CHOL 153  HDL 52  LDLCALC 87  TRIG 70  CHOLHDL 2.9   TSH: No results for input(s): TSH in the last 8760 hours. A1C: Lab Results    Component Value Date   HGBA1C 8.9 (H) 11/24/2015     Assessment/Plan 1. Other pulmonary embolism without acute cor pulmonale, unspecified chronicity (HCC) -pt to cont on xarelto 20 mg daily -increase in shortness of breath, on ambulation notes O2 drops to 87% when walking down the hall, O2 sats improved on 2L [place] to 93%, will order home O2 at 2L to use with activity.  - rivaroxaban (XARELTO) 20 MG TABS tablet; Take 1 tablet (20 mg total) by mouth daily with supper.  Dispense: 30 tablet; Refill: 3 - Ambulatory referral to Hematology for evaluation of hypercoagulable state  2. Type 2 diabetes mellitus with chronic kidney disease, without long-term current use of insulin, unspecified CKD stage ([place]) -discussed importance of medication compliance, diet and exercise. Pt reports increase stress has led to worsening of blood sugar. To work on diet and exercise at this time. To bring blood sugars to follow up visit.   3. Essential hypertension Stable at this time. HCTZ was stopped during hospitalization due to hypokalemia  4. Hypercoagulable state ([place]) - Ambulatory referral to Hematology for ongoing workup. Pt with PE with DVT, no real cause identified during hospitalization.   5. Malignant neoplasm of prostate ([place]) Self caths himself and follows with urology, noted blood in urine and had follow up with urology after ED visit. Pt reports blood comes and goes at this time. No increase in bleeding noted. 02  6. Severe obesity (BMI >= 40) (HCC) -needing lifestyle modifications, to work on increasing exercise and dietary changes.   7. Hypokalemia - BMP with eGFR  To follow up in 1 month on blood pressure, blood sugar, shortness of breath Total time 24mns:  time greater than 50% of total  time spent doing review of records, pt care coordination and education.  Carlos American. Harle Battiest  [facility] [phone] 8 am - 5 pm) [phone] (after  hours)

## 2015-12-18 ENCOUNTER — Telehealth: Payer: Self-pay | Admitting: *Deleted

## 2015-12-18 NOTE — Telephone Encounter (Signed)
Received CMN from [place] [phone] for patient to receive Oxygen. Filled out and placed for Jessica to review and sign. To be faxed back to [place] Fax#: [phone]

## 2015-12-24 ENCOUNTER — Ambulatory Visit: Payer: Medicare Other | Admitting: Nurse Practitioner

## 2015-12-25 MED FILL — hydrALAZINE HCL 10 MG TABS: 10 | 30 days supply | Qty: 90 | Fill #2

## 2016-01-03 ENCOUNTER — Other Ambulatory Visit: Payer: Self-pay

## 2016-01-03 DIAGNOSIS — E1122 Type 2 diabetes mellitus with diabetic chronic kidney disease: Secondary | ICD-10-CM

## 2016-01-03 DIAGNOSIS — I10 Essential (primary) hypertension: Secondary | ICD-10-CM

## 2016-01-03 MED ORDER — METFORMIN HCL 500 MG PO TABS: ORAL_TABLET | ORAL | 2 refills | Status: DC

## 2016-01-03 MED ORDER — HYDRALAZINE HCL 10 MG PO TABS: 10.0000 mg | ORAL_TABLET | Freq: Three times a day (TID) | ORAL | 2 refills | Status: DC

## 2016-01-03 MED ORDER — CARVEDILOL 6.25 MG PO TABS: 6.2500 mg | ORAL_TABLET | Freq: Two times a day (BID) | ORAL | 2 refills | Status: DC

## 2016-01-03 MED ORDER — GLIPIZIDE 5 MG PO TABS: 5.0000 mg | ORAL_TABLET | Freq: Every day | ORAL | 2 refills | Status: DC

## 2016-01-03 NOTE — Telephone Encounter (Signed)
I spoke with patient and confirmed that he requested rx's to be filled through [facility] mail order. Patient states he would like to give the mail order a try.  RX's sent for Carvedilol, glipizide,hydralazine, and metformin

## 2016-01-06 ENCOUNTER — Other Ambulatory Visit: Payer: Self-pay

## 2016-01-06 DIAGNOSIS — E1122 Type 2 diabetes mellitus with diabetic chronic kidney disease: Secondary | ICD-10-CM

## 2016-01-06 MED ORDER — METFORMIN HCL 500 MG PO TABS
ORAL_TABLET | ORAL | 2 refills | Status: DC
Start: 2016-01-06 — End: 2016-01-21

## 2016-01-06 NOTE — Telephone Encounter (Signed)
Refill request was received from [facility] for Metformin HCL 500 mg tablets.   Refill was sent to pharmacy electronically.

## 2016-01-15 ENCOUNTER — Emergency Department (HOSPITAL_BASED_OUTPATIENT_CLINIC_OR_DEPARTMENT_OTHER)
Admission: EM | Admit: 2016-01-15 | Discharge: 2016-01-15 | Disposition: A | Discharge status: Home / Self Care | Payer: Medicare Other | Attending: Emergency Medicine | Admitting: Emergency Medicine

## 2016-01-15 ENCOUNTER — Encounter (HOSPITAL_BASED_OUTPATIENT_CLINIC_OR_DEPARTMENT_OTHER): Payer: Self-pay | Admitting: Emergency Medicine

## 2016-01-15 DIAGNOSIS — Z79899 Other long term (current) drug therapy: Secondary | ICD-10-CM | POA: Diagnosis not present

## 2016-01-15 DIAGNOSIS — I129 Hypertensive chronic kidney disease with stage 1 through stage 4 chronic kidney disease, or unspecified chronic kidney disease: Secondary | ICD-10-CM | POA: Diagnosis not present

## 2016-01-15 DIAGNOSIS — Z8546 Personal history of malignant neoplasm of prostate: Secondary | ICD-10-CM | POA: Diagnosis not present

## 2016-01-15 DIAGNOSIS — Z7984 Long term (current) use of oral hypoglycemic drugs: Secondary | ICD-10-CM | POA: Insufficient documentation

## 2016-01-15 DIAGNOSIS — N189 Chronic kidney disease, unspecified: Secondary | ICD-10-CM | POA: Diagnosis not present

## 2016-01-15 DIAGNOSIS — E1122 Type 2 diabetes mellitus with diabetic chronic kidney disease: Secondary | ICD-10-CM | POA: Insufficient documentation

## 2016-01-15 DIAGNOSIS — J069 Acute upper respiratory infection, unspecified: Secondary | ICD-10-CM | POA: Diagnosis not present

## 2016-01-15 DIAGNOSIS — R05 Cough: Secondary | ICD-10-CM | POA: Diagnosis not present

## 2016-01-15 HISTORY — DX: Other pulmonary embolism without acute cor pulmonale: I26.99

## 2016-01-15 MED ORDER — AZITHROMYCIN 250 MG PO TABS: ORAL_TABLET | ORAL | 0 refills | Status: DC

## 2016-01-15 MED FILL — AZITHROMYCIN 250 MG TABLET: 250 | 5 days supply | Qty: 6 | Fill #0

## 2016-01-15 MED FILL — LISINOPRIL 40 MG TABLET: 40 | 90 days supply | Qty: 90 | Fill #0

## 2016-01-15 NOTE — Discharge Instructions (Signed)
Zithromax as prescribed.  Continue over-the-counter medications as needed for symptomatic relief.  Return to the emergency department for difficulty breathing, chest pain, or if you develop other new and concerning symptoms.

## 2016-01-15 NOTE — ED Triage Notes (Signed)
Pt reports cough, congestion and sore throat x 4 days.

## 2016-01-15 NOTE — ED Provider Notes (Signed)
[place] DEPT [facility] Provider Note   CSN: [id number] Arrival date & time: 01/15/16  [id number]     History   Chief Complaint Chief Complaint  Patient presents with  . URI    HPI Thomas [name] is a 65 y.o. male.  Patient is a 65 year old male with past medical history of prostate cancer, diabetes, obesity, recently diagnosed DVT/PE. He was started on Xarelto approximately one month ago. He presents today with complaints of upper respiratory symptoms. He reports congestion, postnasal drip, sore throat, and irritation to the lining of his throat and lungs. He is having intermittent productive cough. He denies fevers or chills. He does report his wife was ill in a similar fashion. She was prescribed antibiotics and improved. The patient's symptoms have been present for over one week, however have worsened significantly over the past 4 days.   The history is provided by the patient.  URI   This is a new problem. The current episode started more than 1 week ago. The problem has been gradually worsening. There has been no fever. Associated symptoms include congestion and cough. Pertinent negatives include no chest pain. Treatments tried: Corecidin.    Past Medical History:  Diagnosis Date  . Arthritis   . Cancer [facility]) 2010   Prostate  . Chronic kidney disease   . Diabetes mellitus   . DVT (deep vein thrombosis) in pregnancy ([place])   . Hypertension   . PE (pulmonary thromboembolism) [facility])     Patient Active Problem List   Diagnosis Date Noted  . Pulmonary embolism ([place]) 11/23/2015  . Diabetes mellitus ([place]) 12/05/2014  . Anemia, iron deficiency 11/22/2013  . Malignant neoplasm of prostate ([place]) 07/04/2013  . Severe obesity (BMI >= 40) ([place]) 05/23/2013  . B12 deficiency 05/23/2013  . Hyperlipidemia with target LDL less than 100 09/02/2012  . HTN (hypertension) 05/26/2012  . Osteoarthritis, knee 05/26/2012  . MORBID OBESITY 08/22/2008    Past Surgical History:    Procedure Laterality Date  . HERNIA REPAIR    . KNEE SURGERY    . PROSTATE SURGERY    . RADIOACTIVE SEED IMPLANT    . SHOULDER SURGERY    . uretha surgery-2014         Home Medications    Prior to Admission medications   Medication Sig Start Date End Date Taking? Authorizing Provider  amLODipine (NORVASC) 10 MG tablet Take 1 tablet (10 mg total) by mouth daily. For high blood pressure 11/04/15   [name], NP  atorvastatin (LIPITOR) 10 MG tablet Take one tablet by mouth once daily for cholesterol 09/30/15   [name], NP  carvedilol (COREG) 6.25 MG tablet Take 1 tablet (6.25 mg total) by mouth 2 (two) times daily with a meal. For high blood pressure 01/03/16   [name], NP  empagliflozin (JARDIANCE) 10 MG TABS tablet Take 10 mg by mouth daily.    Historical Provider, MD  glipiZIDE (GLUCOTROL) 5 MG tablet Take 1 tablet (5 mg total) by mouth daily before breakfast. 01/03/16   [name], NP  hydrALAZINE (APRESOLINE) 10 MG tablet Take 1 tablet (10 mg total) by mouth 3 (three) times daily. 01/03/16   [name], NP  HYDROcodone-homatropine (HYCODAN) 5-1.5 MG/5ML syrup Take 5 mLs by mouth every 6 (six) hours as needed for cough. 11/07/15   Heather Laisure, PA-C  lisinopril (PRINIVIL,ZESTRIL) 40 MG tablet Take 1 tablet (40 mg total) by mouth daily. For high blood pressure 09/30/15   Janett Billow  Beaulah Corin, NP  metFORMIN (GLUCOPHAGE) 500 MG tablet TAKE 1 TABLET BY MOUTH 2 TIMES A DAY WITH A MEAL for diabetes 01/06/16   [name], NP  omeprazole (PRILOSEC) 20 MG capsule Take 1 capsule (20 mg total) by mouth as needed. 05/24/14   [name], NP  POLY-IRON 150 150 MG capsule TAKE 1 CAPSULE BY MOUTH 2 TIMES DAILY. 03/28/15   Tiffany L Reed, DO  potassium chloride SA (K-DUR,KLOR-CON) 20 MEQ tablet Take 20 mEq by mouth 2 (two) times daily.    Historical Provider, MD  rivaroxaban (XARELTO) 20 MG TABS tablet Take 1 tablet (20 mg total) by mouth daily with  supper. 12/16/15   [name], NP  traMADol (ULTRAM) 50 MG tablet Take 1 tablet (50 mg total) by mouth every 8 (eight) hours as needed. for pain 09/30/15   [name], NP  TRUE METRIX BLOOD GLUCOSE TEST test strip CHECK BLOOD SUGAR TWICE PER DAY 10/24/15   [name], NP  TRUEPLUS LANCETS 30G MISC USE TO TEST BLOOD SUGAR 2 TIMES A DAY 09/12/14   [name], NP  vitamin B-12 (CYANOCOBALAMIN) 1000 MCG tablet Take 1,000 mcg by mouth daily.    Historical Provider, MD    Family History No family history on file.  Social History Social History  Substance Use Topics  . Smoking status: Never Smoker  . Smokeless tobacco: Never Used  . Alcohol use No     Comment: never     Allergies   Patient has no known allergies.   Review of Systems Review of Systems  HENT: Positive for congestion.   Respiratory: Positive for cough.   Cardiovascular: Negative for chest pain.  All other systems reviewed and are negative.    Physical Exam Updated Vital Signs BP 155/83 (BP Location: Left Arm)   Pulse 82   Temp 98.2 F (36.8 C) (Oral)   Resp 22   Ht 5\' 9"  (1.753 m)   Wt (!) 330 lb (149.7 kg)   SpO2 92%   BMI 48.73 kg/m   Physical Exam  Constitutional: He is oriented to person, place, and time. He appears well-developed and well-nourished. No distress.  HENT:  Head: Normocephalic and atraumatic.  Posterior oropharynx is erythematous without exudates  Neck: Normal range of motion. Neck supple.  Cardiovascular: Normal rate and regular rhythm.  Exam reveals no friction rub.   No murmur heard. Pulmonary/Chest: Effort normal and breath sounds normal. No respiratory distress. He has no wheezes. He has no rales.  Abdominal: Soft. Bowel sounds are normal. He exhibits no distension. There is no tenderness.  Musculoskeletal: Normal range of motion. He exhibits no edema.  Neurological: He is alert and oriented to person, place, and time. Coordination normal.  Skin: Skin is  warm and dry. He is not diaphoretic.  Nursing note and vitals reviewed.    ED Treatments / Results  Labs (all labs ordered are listed, but only abnormal results are displayed) Labs Reviewed - No data to display  EKG  EKG Interpretation None       Radiology No results found.  Procedures Procedures (including critical care time)  Medications Ordered in ED Medications - No data to display   Initial Impression / Assessment and Plan / ED Course  I have reviewed the triage vital signs and the nursing notes.  Pertinent labs & imaging results that were available during my care of the patient were reviewed by me and considered in my medical decision  making (see chart for details).  Clinical Course     Patient presents with upper respiratory symptoms. His lungs are clear and oxygen levels are adequate. He was recently diagnosed with pulmonary embolism, but reports taking his Xarelto religiously. I see no indication for extensive workup. Due to his comorbidities and length of symptoms. He will be treated with an antibiotic. He is to return as needed for any problems.  Final Clinical Impressions(s) / ED Diagnoses   Final diagnoses:  None    New Prescriptions New Prescriptions   No medications on file     Veryl Speak, MD 01/15/16 [phone]

## 2016-01-16 ENCOUNTER — Ambulatory Visit (INDEPENDENT_AMBULATORY_CARE_PROVIDER_SITE_OTHER): Payer: Medicare Other | Admitting: Nurse Practitioner

## 2016-01-16 ENCOUNTER — Ambulatory Visit: Payer: Medicare Other | Admitting: Nurse Practitioner

## 2016-01-16 ENCOUNTER — Encounter: Payer: Self-pay | Admitting: Nurse Practitioner

## 2016-01-16 VITALS — BP 148/84 | HR 77 | Temp 97.5°F | Resp 18 | Ht 69.0 in | Wt 332.2 lb

## 2016-01-16 DIAGNOSIS — I2699 Other pulmonary embolism without acute cor pulmonale: Secondary | ICD-10-CM

## 2016-01-16 DIAGNOSIS — D6859 Other primary thrombophilia: Secondary | ICD-10-CM | POA: Diagnosis not present

## 2016-01-16 DIAGNOSIS — E1122 Type 2 diabetes mellitus with diabetic chronic kidney disease: Secondary | ICD-10-CM | POA: Diagnosis not present

## 2016-01-16 DIAGNOSIS — I10 Essential (primary) hypertension: Secondary | ICD-10-CM

## 2016-01-16 DIAGNOSIS — J069 Acute upper respiratory infection, unspecified: Secondary | ICD-10-CM

## 2016-01-16 DIAGNOSIS — C61 Malignant neoplasm of prostate: Secondary | ICD-10-CM

## 2016-01-16 NOTE — Progress Notes (Signed)
Careteam: Patient Care Team: [name], NP as PCP - General (Nurse Practitioner)  Advanced Directive information Does Patient Have a Medical Advance Directive?: No, Would patient like information on creating a medical advance directive?: Yes (MAU/Ambulatory/Procedural Areas - Information given)  No Known Allergies  Chief Complaint  Patient presents with  . Medical Management of Chronic Issues    1 month follow up on SOB, PE, DM, and HTN     HPI: Patient is a 65 y.o. male seen in the office today for a follow-up on his blood pressure, shortness of breath, and blood sugar readings.  The patient was diagnosed with a PE and LLE DVT on 10/7. He was started on Xarelto at that time. He reports the shortness of breath comes and goes and that he is not needing the O2 as much during the day, but does wear it at night. He reports exertional shortness of breath. The patient did buy a pulse oximeter to monitor his sats at home. He checks it when he gets short of breath and gets readings from 83-88% on room air a lot of the time. He has home O2, which he applies when he is short of breath and his sats are low. 94% today without o2  Patient is concerned about the level of activity he is supposed to be at with this DVT. He wants to know if he can exercise or if he needs to take it easy to prevent the blood clot from breaking loose and traveling. At this time he has not exercised since being diagnosed as he is very fearful.  Has not heard anything from the hematologist office at this time.   Patient has noticed a little blood in his urine but he states he went to his urologist and was told this is normal since he does self-catheterize. No other obvious signs of bleeding.  The patient does keep a log of his blood sugar, but was in a hurry this morning and left it at home. He reports he does not always check a fasting blood sugar, but he did this morning and it was 242. He got a reading of 149  last night. He states his average is 110-120. He is not always compliant with medications and states he has a lot going on in his family life and doesn't always think about taking his medication before he eats in the morning. He follows this [employer] on most days. He states when he follows the diet he get blood sugar readings below 110.  He checks his blood pressure at home. Average BP is 160s/80s. He denies blurred vision, headaches, chest pain, and lightheadedness.   He went to the ED yesterday (11/29) with cold symptoms. He was diagnosed with a URI 11/29 and given a Z-pack. The patient reports he already feels some improvement.   Review of Systems:  Review of Systems  Constitutional: Negative for appetite change, chills, fatigue, fever and unexpected weight change.  HENT: Positive for congestion and postnasal drip.   Eyes: Negative for visual disturbance.  Respiratory: Positive for cough (light yellow sputum), shortness of breath and wheezing.   Cardiovascular: Negative for chest pain, palpitations and leg swelling.  Gastrointestinal: Negative for abdominal pain, blood in stool, constipation and diarrhea.  Genitourinary:       Self-caths  Neurological: Negative for dizziness and light-headedness.  Hematological: Bruises/bleeds easily (Xarelto).    Past Medical History:  Diagnosis Date  . Arthritis   . Cancer ([place]) 2010  Prostate  . Chronic kidney disease   . Diabetes mellitus   . DVT (deep vein thrombosis) in pregnancy ([place])   . Hypertension   . PE (pulmonary thromboembolism) ([place])    Past Surgical History:  Procedure Laterality Date  . HERNIA REPAIR    . KNEE SURGERY    . PROSTATE SURGERY    . RADIOACTIVE SEED IMPLANT    . SHOULDER SURGERY    . uretha surgery-2014     Social History:   reports that he has never smoked. He has never used smokeless tobacco. He reports that he does not drink alcohol or use drugs.  History reviewed. No pertinent family  history.  Medications: Patient's Medications  New Prescriptions   No medications on file  Previous Medications   AMLODIPINE (NORVASC) 10 MG TABLET    Take 1 tablet (10 mg total) by mouth daily. For high blood pressure   ATORVASTATIN (LIPITOR) 10 MG TABLET    Take one tablet by mouth once daily for cholesterol   AZITHROMYCIN (ZITHROMAX Z-PAK) 250 MG TABLET    2 po day one, then 1 daily x 4 days   CARVEDILOL (COREG) 6.25 MG TABLET    Take 1 tablet (6.25 mg total) by mouth 2 (two) times daily with a meal. For high blood pressure   EMPAGLIFLOZIN (JARDIANCE) 10 MG TABS TABLET    Take 10 mg by mouth daily.   GLIPIZIDE (GLUCOTROL) 5 MG TABLET    Take 1 tablet (5 mg total) by mouth daily before breakfast.   HYDRALAZINE (APRESOLINE) 10 MG TABLET    Take 1 tablet (10 mg total) by mouth 3 (three) times daily.   HYDROCODONE-HOMATROPINE (HYCODAN) 5-1.5 MG/5ML SYRUP    Take 5 mLs by mouth every 6 (six) hours as needed for cough.   LISINOPRIL (PRINIVIL,ZESTRIL) 40 MG TABLET    Take 1 tablet (40 mg total) by mouth daily. For high blood pressure   METFORMIN (GLUCOPHAGE) 500 MG TABLET    TAKE 1 TABLET BY MOUTH 2 TIMES A DAY WITH A MEAL for diabetes   OMEPRAZOLE (PRILOSEC) 20 MG CAPSULE    Take 1 capsule (20 mg total) by mouth as needed.   POLY-IRON 150 150 MG CAPSULE    TAKE 1 CAPSULE BY MOUTH 2 TIMES DAILY.   POTASSIUM CHLORIDE SA (K-DUR,KLOR-CON) 20 MEQ TABLET    Take 20 mEq by mouth 2 (two) times daily.   RIVAROXABAN (XARELTO) 20 MG TABS TABLET    Take 1 tablet (20 mg total) by mouth daily with supper.   TRAMADOL (ULTRAM) 50 MG TABLET    Take 1 tablet (50 mg total) by mouth every 8 (eight) hours as needed. for pain   TRUE METRIX BLOOD GLUCOSE TEST TEST STRIP    CHECK BLOOD SUGAR TWICE PER DAY   TRUEPLUS LANCETS 30G MISC    USE TO TEST BLOOD SUGAR 2 TIMES A DAY   VITAMIN B-12 (CYANOCOBALAMIN) 1000 MCG TABLET    Take 1,000 mcg by mouth daily.  Modified Medications   No medications on file  Discontinued  Medications   No medications on file     Physical Exam:  Vitals:   01/16/16 0832  BP: (!) 148/84  Pulse: 77  Resp: 18  Temp: 97.5 F (36.4 C)  TempSrc: Oral  SpO2: 94%  Weight: (!) 332 lb 3.2 oz (150.7 kg)  Height: 5\' 9"  (1.753 m)   Body mass index is 49.06 kg/m.  Physical Exam  Constitutional: He is oriented to person,  place, and time. He appears well-developed and well-nourished.  HENT:  Head: Normocephalic and atraumatic.  Mouth/Throat: No oropharyngeal exudate.  Eyes: Conjunctivae are normal. Pupils are equal, round, and reactive to light.  Neck: Normal range of motion. Neck supple. Carotid bruit is not present.  Cardiovascular: Normal rate, regular rhythm and normal heart sounds.   Pulmonary/Chest: Effort normal and breath sounds normal. No respiratory distress. He has no wheezes. He has no rales. He exhibits no tenderness.  Abdominal: Soft. Bowel sounds are normal. He exhibits no abdominal bruit and no pulsatile midline mass.  Musculoskeletal: Normal range of motion. He exhibits no edema or tenderness.  Neurological: He is alert and oriented to person, place, and time. No sensory deficit.  Skin: Skin is warm and dry.  Psychiatric: He has a normal mood and affect. His behavior is normal. Judgment and thought content normal.   Labs reviewed: Basic Metabolic Panel:  Recent Labs  11/25/15 0153  11/26/15 0222 12/04/15 0840 12/16/15 0944  NA  --   < > 138 139 141  K  --   < > 3.2* 3.1* 3.7  CL  --   < > 101 106 107  CO2  --   < > 26 24 24   GLUCOSE  --   < > 355* 307* 223*  BUN  --   < > 19 17 15   CREATININE  --   < > 1.31* 1.17 0.93  CALCIUM  --   < > 8.4* 8.8* 8.9  MG 2.0  --   --   --   --   < > = values in this interval not displayed. Liver Function Tests:  Recent Labs  06/28/15 0942 09/30/15 0952 11/01/15 0936  AST 13 10 13   ALT 10 12 10   ALKPHOS 74 98 70  BILITOT 0.5 0.6 0.5  PROT 7.1 7.0 7.2  ALBUMIN 4.1 3.7 3.9   No results for input(s):  LIPASE, AMYLASE in the last 8760 hours. No results for input(s): AMMONIA in the last 8760 hours. CBC:  Recent Labs  09/30/15 0952 11/23/15 1205  11/25/15 0157 11/26/15 0222 12/04/15 0840  WBC 7.8 7.7  < > 8.1 8.8 8.6  NEUTROABS 5,616 5.6  --   --   --  6.6  HGB 13.6 13.5  < > 13.0 12.4* 13.0  HCT 41.8 42.2  < > 42.0 40.5 39.9  MCV 83.4 83.1  < > 85.4 85.8 82.8  PLT 263 229  < > 233 228 289  < > = values in this interval not displayed. Lipid Panel:  Recent Labs  03/08/15 0834  CHOL 153  HDL 52  LDLCALC 87  TRIG 70  CHOLHDL 2.9   TSH: No results for input(s): TSH in the last 8760 hours. A1C: Lab Results  Component Value Date   HGBA1C 8.9 (H) 11/24/2015     Assessment/Plan 1. Essential hypertension - Not well controlled. Readings of 160s/80s obtained at home. 148/84 today.  - will increase hydralazine, pt just got refill on hydralazine, will increase to take Hydralazine 10 mg to 2 tablets TID and keep a BP log to bring to next appointment.  - Continue Norvasc, coreg, and lisinopril as prescribed. - Education provided on the [employer]. - Advised to exercise as tolerated. - Offered PT --- patient refused at this time.  2. Other pulmonary embolism without acute cor pulmonale, unspecified chronicity (HCC) - Exertional shortness of breath. Already has home and portable O2 that he uses as needed. -  Monitors O2 with a pulse oximeter when SOB - obtains readings 83-88% - applies O2. - Continues on Xarelto 20 mg daily.  3. Malignant neoplasm of prostate ([place]) - Self catheterizes. - Continue to follow with urologist.  4. MORBID OBESITY - Encouraged exercise as tolerated and dietary modifications.   5. Type 2 diabetes mellitus with chronic kidney disease, without long-term current use of insulin, unspecified CKD stage ([place]) - Not well controlled. HgbA1c 8.9 on 11/24/15. - Dietary education provided. Exercise encouraged. - To keep a log of blood sugar readings and bring  to next appointment. - Continue Jardiance, glipizide, and metformin.  6. Hypercoagulable state ([place]) - Awaiting appointment with hematology as they are reviewing his records and will contact him with an appointment.  7. Acute upper respiratory infection - Seen in ED yesterday and given Z-pack.  - States he is feeling better. - To continue on ABT until complete.   Follow-up in 1 month on blood pressure and blood sugar readings. To bring logs of both with him to the appointment.   Thomas [name]. Harle Battiest  [facility] [phone] 8 am - 5 pm) [phone] (after hours)

## 2016-01-16 NOTE — Patient Instructions (Addendum)
Increase Hydralazine to 25 mg TID.--- may take 2 of the 10 mg tablets   Return in 1 month for follow-up of blood pressure and blood sugar readings. Please keep a log of both your blood pressure and blood sugar and bring them with you to your next appointment.    DASH Eating Plan DASH stands for "Dietary Approaches to Stop Hypertension." The DASH eating plan is a healthy eating plan that has been shown to reduce high blood pressure (hypertension). Additional health benefits may include reducing the risk of type 2 diabetes mellitus, heart disease, and stroke. The DASH eating plan may also help with weight loss. What do I need to know about the DASH eating plan? For the DASH eating plan, you will follow these general guidelines:  Choose foods with less than 150 milligrams of sodium per serving (as listed on the food label).  Use salt-free seasonings or herbs instead of table salt or sea salt.  Check with your health care provider or pharmacist before using salt substitutes.  Eat lower-sodium products. These are often labeled as "low-sodium" or "no salt added."  Eat fresh foods. Avoid eating a lot of canned foods.  Eat more vegetables, fruits, and low-fat dairy products.  Choose whole grains. Look for the word "whole" as the first word in the ingredient list.  Choose fish and skinless chicken or [place] more often than red meat. Limit fish, poultry, and meat to 6 oz (170 g) each day.  Limit sweets, desserts, sugars, and sugary drinks.  Choose heart-healthy fats.  Eat more home-cooked food and less restaurant, buffet, and fast food.  Limit fried foods.  Do not fry foods. Cook foods using methods such as baking, boiling, grilling, and broiling instead.  When eating at a restaurant, ask that your food be prepared with less salt, or no salt if possible. What foods can I eat? Seek help from a dietitian for individual calorie needs. Grains  Whole grain or whole wheat bread. Brown rice.  Whole grain or whole wheat pasta. Quinoa, bulgur, and whole grain cereals. Low-sodium cereals. Corn or whole wheat flour tortillas. Whole grain cornbread. Whole grain crackers. Low-sodium crackers. Vegetables  Fresh or frozen vegetables (raw, steamed, roasted, or grilled). Low-sodium or reduced-sodium tomato and vegetable juices. Low-sodium or reduced-sodium tomato sauce and paste. Low-sodium or reduced-sodium canned vegetables. Fruits  All fresh, canned (in natural juice), or frozen fruits. Meat and Other Protein Products  Ground beef (85% or leaner), grass-fed beef, or beef trimmed of fat. Skinless chicken or [place]. Ground chicken or [place]. Pork trimmed of fat. All fish and seafood. Eggs. Dried beans, peas, or lentils. Unsalted nuts and seeds. Unsalted canned beans. Dairy  Low-fat dairy products, such as skim or 1% milk, 2% or reduced-fat cheeses, low-fat ricotta or cottage cheese, or plain low-fat yogurt. Low-sodium or reduced-sodium cheeses. Fats and Oils  Tub margarines without trans fats. Light or reduced-fat mayonnaise and salad dressings (reduced sodium). Avocado. Safflower, olive, or canola oils. Natural peanut or almond butter. Other  Unsalted popcorn and pretzels. The items listed above may not be a complete list of recommended foods or beverages. Contact your dietitian for more options.  What foods are not recommended? Grains  White bread. White pasta. White rice. Refined cornbread. Bagels and croissants. Crackers that contain trans fat. Vegetables  Creamed or fried vegetables. Vegetables in a cheese sauce. Regular canned vegetables. Regular canned tomato sauce and paste. Regular tomato and vegetable juices. Fruits  Canned fruit in light or heavy syrup.  Fruit juice. Meat and Other Protein Products  Fatty cuts of meat. Ribs, chicken wings, bacon, sausage, bologna, salami, chitterlings, fatback, hot dogs, bratwurst, and packaged luncheon meats. Salted nuts and seeds. Canned beans  with salt. Dairy  Whole or 2% milk, cream, half-and-half, and cream cheese. Whole-fat or sweetened yogurt. Full-fat cheeses or blue cheese. Nondairy creamers and whipped toppings. Processed cheese, cheese spreads, or cheese curds. Condiments  Onion and garlic salt, seasoned salt, table salt, and sea salt. Canned and packaged gravies. Worcestershire sauce. Tartar sauce. Barbecue sauce. Teriyaki sauce. Soy sauce, including reduced sodium. Steak sauce. Fish sauce. Oyster sauce. Cocktail sauce. Horseradish. Ketchup and mustard. Meat flavorings and tenderizers. Bouillon cubes. Hot sauce. Tabasco sauce. Marinades. Taco seasonings. Relishes. Fats and Oils  Butter, stick margarine, lard, shortening, ghee, and bacon fat. Coconut, palm kernel, or palm oils. Regular salad dressings. Other  Pickles and olives. Salted popcorn and pretzels. The items listed above may not be a complete list of foods and beverages to avoid. Contact your dietitian for more information.  Where can I find more information? National Heart, Lung, and Blood Institute: travelstabloid.com This information is not intended to replace advice given to you by your health care provider. Make sure you discuss any questions you have with your health care provider. Document Released: 01/22/2011 Document Revised: 07/11/2015 Document Reviewed: 12/07/2012 Elsevier Interactive Patient Education  2017 [employer].

## 2016-01-21 ENCOUNTER — Other Ambulatory Visit: Payer: Self-pay | Admitting: *Deleted

## 2016-01-21 DIAGNOSIS — E1122 Type 2 diabetes mellitus with diabetic chronic kidney disease: Secondary | ICD-10-CM

## 2016-01-21 MED ORDER — METFORMIN HCL 500 MG PO TABS: ORAL_TABLET | ORAL | 3 refills | Status: DC

## 2016-01-21 NOTE — Telephone Encounter (Signed)
[facility] 

## 2016-01-23 ENCOUNTER — Encounter: Payer: Self-pay | Admitting: Hematology & Oncology

## 2016-01-23 ENCOUNTER — Other Ambulatory Visit (HOSPITAL_BASED_OUTPATIENT_CLINIC_OR_DEPARTMENT_OTHER): Payer: Medicare Other

## 2016-01-23 ENCOUNTER — Ambulatory Visit (HOSPITAL_BASED_OUTPATIENT_CLINIC_OR_DEPARTMENT_OTHER): Payer: Medicare Other | Admitting: Hematology & Oncology

## 2016-01-23 ENCOUNTER — Ambulatory Visit: Payer: Medicare Other

## 2016-01-23 VITALS — BP 158/85 | HR 72 | Temp 97.9°F | Resp 18 | Wt 336.0 lb

## 2016-01-23 DIAGNOSIS — C61 Malignant neoplasm of prostate: Secondary | ICD-10-CM | POA: Diagnosis not present

## 2016-01-23 DIAGNOSIS — I2602 Saddle embolus of pulmonary artery with acute cor pulmonale: Secondary | ICD-10-CM

## 2016-01-23 DIAGNOSIS — I824Y2 Acute embolism and thrombosis of unspecified deep veins of left proximal lower extremity: Secondary | ICD-10-CM

## 2016-01-23 DIAGNOSIS — I2782 Chronic pulmonary embolism: Secondary | ICD-10-CM

## 2016-01-23 DIAGNOSIS — N189 Chronic kidney disease, unspecified: Secondary | ICD-10-CM | POA: Diagnosis not present

## 2016-01-23 DIAGNOSIS — I2699 Other pulmonary embolism without acute cor pulmonale: Secondary | ICD-10-CM | POA: Diagnosis not present

## 2016-01-23 LAB — CBC WITH DIFFERENTIAL (CANCER CENTER ONLY)
BASO#: 0 10*3/uL (ref 0.0–0.2)
BASO%: 0.6 % (ref 0.0–2.0)
EOS%: 0.6 % (ref 0.0–7.0)
Eosinophils Absolute: 0 10*3/uL (ref 0.0–0.5)
HCT: 42.6 % (ref 38.7–49.9)
HGB: 13.8 g/dL (ref 13.0–17.1)
LYMPH#: 2 10*3/uL (ref 0.9–3.3)
LYMPH%: 27 % (ref 14.0–48.0)
MCH: 26.6 pg — ABNORMAL LOW (ref 28.0–33.4)
MCHC: 32.4 g/dL (ref 32.0–35.9)
MCV: 82 fL (ref 82–98)
MONO#: 0.6 10*3/uL (ref 0.1–0.9)
MONO%: 7.9 % (ref 0.0–13.0)
NEUT#: 4.6 10*3/uL (ref 1.5–6.5)
NEUT%: 63.9 % (ref 40.0–80.0)
Platelets: 270 10*3/uL (ref 145–400)
RBC: 5.18 10*6/uL (ref 4.20–5.70)
RDW: 15.1 % (ref 11.1–15.7)
WBC: 7.2 10*3/uL (ref 4.0–10.0)

## 2016-01-23 LAB — CHCC SATELLITE - SMEAR

## 2016-01-23 MED ORDER — RIVAROXABAN 20 MG PO TABS: 20.0000 mg | ORAL_TABLET | Freq: Every day | ORAL | 12 refills | Status: DC

## 2016-01-23 NOTE — Progress Notes (Signed)
Referral MD  Reason for Referral: Pulmonary embolism of the right pulmonary artery and thrombus of the left femoral vein.   No chief complaint on file. : I had a blood clot in my left leg and one my right lung.  HPI: Thomas [name] is a very nice 65 year old African-American male. He is somewhat overweight. He has a history of prostate cancer. It does not sound like the prostate cancer is being followed. He said he had seed implant for this. He thinks this was about 3 or 4 years ago.  He says his daughter recently had a blood clot in her leg. This was about 7 months ago.  In early October, he began to have some shortness of breath. He thought that this was a cold that he picked up from women's great grandkids. However, the shortness of breath worsened. He ultimately end up in the emergency room. He had a CT angiogram done. This showed a thrombus in the distal right pulmonary artery and all the right lobar branches. There is no right heart strain.  He also had Doppler of his legs. It showed that he had a thrombus in the left thigh from the common femoral vein down to the popliteal vein. He was started on heparin. He was then moved over to Xarelto.  He still has some swelling in the left leg. He does not have any kind of compression stocking. He was told that he cannot exercise. I told him that he can exercise gradually. He likes to swim. I think this would be a very good idea.  Not surprise like, he did not have any type of hypercoagulable studies that were done while he was hospitalized.  He has had no bleeding. He has had no change in bowel or bladder habits. He has had no cough or shortness of breath. He feels that his breathing is better.  He does not smoke. He does not drink. He has no obvious occupational exposures. He is retired.  He is originally from [place]. He then moved to [place] and then has been down in [place] for probably 30 years.  Overall, his performance status  is ECOG 1.   Past Medical History:  Diagnosis Date  . Arthritis   . Cancer [facility]) 2010   Prostate  . Chronic kidney disease   . Diabetes mellitus   . DVT (deep vein thrombosis) in pregnancy ([place])   . Hypertension   . PE (pulmonary thromboembolism) ([place])   :  Past Surgical History:  Procedure Laterality Date  . HERNIA REPAIR    . KNEE SURGERY    . PROSTATE SURGERY    . RADIOACTIVE SEED IMPLANT    . SHOULDER SURGERY    . uretha surgery-2014    :   Current Outpatient Prescriptions:  .  amLODipine (NORVASC) 10 MG tablet, Take 1 tablet (10 mg total) by mouth daily. For high blood pressure, Disp: 90 tablet, Rfl: 2 .  atorvastatin (LIPITOR) 10 MG tablet, Take one tablet by mouth once daily for cholesterol, Disp: 90 tablet, Rfl: 3 .  carvedilol (COREG) 6.25 MG tablet, Take 1 tablet (6.25 mg total) by mouth 2 (two) times daily with a meal. For high blood pressure, Disp: 180 tablet, Rfl: 2 .  empagliflozin (JARDIANCE) 10 MG TABS tablet, Take 10 mg by mouth daily., Disp: , Rfl:  .  glipiZIDE (GLUCOTROL) 5 MG tablet, Take 1 tablet (5 mg total) by mouth daily before breakfast., Disp: 90 tablet, Rfl: 2 .  hydrALAZINE (  APRESOLINE) 10 MG tablet, Take 1 tablet (10 mg total) by mouth 3 (three) times daily., Disp: 270 tablet, Rfl: 2 .  lisinopril (PRINIVIL,ZESTRIL) 40 MG tablet, Take 1 tablet (40 mg total) by mouth daily. For high blood pressure, Disp: 90 tablet, Rfl: 1 .  metFORMIN (GLUCOPHAGE) 500 MG tablet, TAKE 1 TABLET BY MOUTH 2 TIMES A DAY WITH A MEAL for diabetes, Disp: 180 tablet, Rfl: 3 .  omeprazole (PRILOSEC) 20 MG capsule, Take 1 capsule (20 mg total) by mouth as needed., Disp: 30 capsule, Rfl: 3 .  POLY-IRON 150 150 MG capsule, TAKE 1 CAPSULE BY MOUTH 2 TIMES DAILY., Disp: 60 capsule, Rfl: 3 .  potassium chloride SA (K-DUR,KLOR-CON) 20 MEQ tablet, Take 20 mEq by mouth 2 (two) times daily., Disp: , Rfl:  .  rivaroxaban (XARELTO) 20 MG TABS tablet, Take 1 tablet (20 mg total) by mouth  daily with supper., Disp: 30 tablet, Rfl: 12 .  traMADol (ULTRAM) 50 MG tablet, Take 1 tablet (50 mg total) by mouth every 8 (eight) hours as needed. for pain, Disp: 90 tablet, Rfl: 1 .  TRUE METRIX BLOOD GLUCOSE TEST test strip, CHECK BLOOD SUGAR TWICE PER DAY, Disp: 100 each, Rfl: PRN .  TRUEPLUS LANCETS 30G MISC, USE TO TEST BLOOD SUGAR 2 TIMES A DAY, Disp: 100 each, Rfl: 12 .  vitamin B-12 (CYANOCOBALAMIN) 1000 MCG tablet, Take 1,000 mcg by mouth daily., Disp: , Rfl: :  :  No Known Allergies:  History reviewed. No pertinent family history.:  Social History   Social History  . Marital status: Married    Spouse name: N/A  . Number of children: N/A  . Years of education: N/A   Occupational History  . Not on file.   Social History Main Topics  . Smoking status: Never Smoker  . Smokeless tobacco: Never Used  . Alcohol use No     Comment: never  . Drug use: No  . Sexual activity: Yes   Other Topics Concern  . Not on file   Social History Narrative  . No narrative on file  :  Pertinent items are noted in HPI.  Exam: @IPVITALS @ Obese Afro-American male in no obvious distress. Vital signs show a temperature of 97.9. Pulse 72. Blood pressure 150/85. Weight is 336 lbs. Head and neck exam shows no ocular or oral lesions. He has no palpable cervical or supraclavicular lymph nodes. Lungs are clear bilaterally. He has no rales, wheezes or rhonchi. Cardiac exam regular rate and rhythm with no murmurs, rubs or bruits. Abdomen is soft. He has good bowel sounds. There is no fluid wave. He is obese. There is no palpable liver or spleen tip. Back exam shows no tenderness over the spine, ribs or hips. Extremities shows no clubbing, cyanosis or edema. He does have some nonpitting edema of the left leg. He has a negative Homans sign in the left leg. No palpable venous cord is noted in his legs. Neurological exam shows no focal neurological deficits. Skin exam shows no rashes, ecchymoses or  petechia.    Recent Labs  01/23/16 1452  WBC 7.2  HGB 13.8  HCT 42.6  PLT 270   No results for input(s): NA, K, CL, CO2, GLUCOSE, BUN, CREATININE, CALCIUM in the last 72 hours.  Blood smear review:  None  Pathology: None     Assessment and Plan:  Thomas [name] is a 65 year old African-American male. He has pulmonary embolism and left lower extremity thromboembolic disease. He is on Xarelto.  I think he will need full dose Xarelto for at least 1 year.  It is harder to say if there is any thrombophilic state here. He does have a history of prostate cancer. I think we have to see what his PSA is. Hopefully, his PSA is not detectable. If he has an elevated PSA, then he clearly will need to have a workup for this.  Given that there are 2 family members with blood clots, a hypercoagulable panel might need to be done. We can always do this when we see him back.  I think that a fitted compression stocking for his left leg would be reasonable. I gave him a prescription for this.  We see him back, we will definitely need to get another CT angiogram and Doppler of his left leg to assess for response to Xarelto. I would like to think that the poor emboli will be gone. He may have a chronic thrombus in the left leg.  Outside of his obesity, he does have diabetes. This clearly is a risk factor for thromboembolic disease. I am not sure as to how well the diabetes is controlled.  I spent about 45 minutes with him. He is very nice. We will see him back in 5- 6 weeks. We will get our Doppler and angiogram when we see him back.

## 2016-01-24 ENCOUNTER — Telehealth: Payer: Self-pay | Admitting: *Deleted

## 2016-01-24 LAB — D-DIMER, QUANTITATIVE: D-DIMER: 0.31 mg/L FEU (ref 0.00–0.49)

## 2016-01-24 LAB — PSA: Prostate Specific Ag, Serum: <0.1 ng/mL (ref 0.0–4.0)

## 2016-01-24 LAB — LACTATE DEHYDROGENASE: LDH: 197 U/L (ref 125–245)

## 2016-01-24 NOTE — Telephone Encounter (Addendum)
Patient aware of results  ----- Message from Volanda Napoleon, MD sent at 01/24/2016  7:08 AM EST ----- Call - Your prostate cancer is in remission.  Your PSA is not detectable!!  Thomas [name]

## 2016-01-27 LAB — HEMOGLOBINOPATHY EVALUATION
HGB C: 0 %
HGB S: 0 %
Hemoglobin A2 Quantitation: 2.3 % (ref 0.7–3.1)
Hemoglobin F Quantitation: 0 % (ref 0.0–2.0)
Hgb A: 97.7 % (ref 94.0–98.0)

## 2016-02-13 ENCOUNTER — Ambulatory Visit: Payer: Medicare Other | Admitting: Nurse Practitioner

## 2016-02-20 ENCOUNTER — Encounter: Payer: Self-pay | Admitting: Nurse Practitioner

## 2016-02-20 ENCOUNTER — Ambulatory Visit (INDEPENDENT_AMBULATORY_CARE_PROVIDER_SITE_OTHER): Payer: Medicare Other | Admitting: Nurse Practitioner

## 2016-02-20 VITALS — BP 142/84 | HR 73 | Temp 97.9°F | Resp 18 | Ht 69.0 in | Wt 341.4 lb

## 2016-02-20 DIAGNOSIS — I10 Essential (primary) hypertension: Secondary | ICD-10-CM | POA: Diagnosis not present

## 2016-02-20 DIAGNOSIS — E1122 Type 2 diabetes mellitus with diabetic chronic kidney disease: Secondary | ICD-10-CM

## 2016-02-20 DIAGNOSIS — Z8546 Personal history of malignant neoplasm of prostate: Secondary | ICD-10-CM

## 2016-02-20 DIAGNOSIS — I2699 Other pulmonary embolism without acute cor pulmonale: Secondary | ICD-10-CM

## 2016-02-20 LAB — HEMOGLOBIN A1C
Hgb A1c MFr Bld: 6.8 % — ABNORMAL HIGH (ref <5.7)
Mean Plasma Glucose: 148 mg/dL

## 2016-02-20 MED ORDER — HYDRALAZINE HCL 25 MG PO TABS: 25.0000 mg | ORAL_TABLET | Freq: Three times a day (TID) | ORAL | 3 refills | Status: DC

## 2016-02-20 MED FILL — AMLODIPINE BESYLATE 10 MG T: 10 | 90 days supply | Qty: 90 | Fill #1

## 2016-02-20 NOTE — Progress Notes (Signed)
Careteam: Patient Care Team: [name], NP as PCP - General (Nurse Practitioner)  Advanced Directive information Does Patient Have a Medical Advance Directive?: No  No Known Allergies  Chief Complaint  Patient presents with  . Medical Management of Chronic Issues    1 month follow up on blood pressure     HPI: Patient is a 66 y.o. male seen in the office today for a follow-up on blood pressure and blood sugar.   Patient did increase the Hydralazine to 20 mg TID as prescribed at his last visit on 11/30. He did not take his blood pressure every day but did take it when he remembered. He did not keep a log of his BP readings as he planned to bring the machine with him so that the history could be reviewed. He does not have the machine. From what he remembers he believes his BP averages 140s/70-80s.   Atkins diet this past month. Has been lifting weight and getting out doing more. Plans to start back working out in the pool next week.   The patient did bring a log of his blood sugar readings, they are not fasting. Of the 14 blood sugar readings recorded, 3 readings are greater than 150 (167, 179, 165). The other readings range from 97-149.  Not dependent on oxygen, uses at times. Becomes short of breath with exertion. Has follow up CT scheduled due to PE. Ongoing follow up with hematologist   Review of Systems:  Review of Systems  Constitutional: Negative for appetite change, chills, fatigue, fever and unexpected weight change.  HENT: Negative for congestion.   Eyes: Negative for visual disturbance.  Respiratory: Positive for shortness of breath (with exertion). Negative for cough and wheezing.   Cardiovascular: Negative for chest pain, palpitations and leg swelling.  Gastrointestinal: Negative for abdominal pain, blood in stool, constipation and diarrhea.  Genitourinary:       Self-caths  Neurological: Negative for dizziness and light-headedness.  Hematological:  Bruises/bleeds easily (Xarelto).    Past Medical History:  Diagnosis Date  . Arthritis   . Cancer [facility]) 2010   Prostate  . Chronic kidney disease   . Diabetes mellitus   . DVT (deep vein thrombosis) in pregnancy ([place])   . Hypertension   . PE (pulmonary thromboembolism) ([place])    Past Surgical History:  Procedure Laterality Date  . HERNIA REPAIR    . KNEE SURGERY    . PROSTATE SURGERY    . RADIOACTIVE SEED IMPLANT    . SHOULDER SURGERY    . uretha surgery-2014     Social History:   reports that he has never smoked. He has never used smokeless tobacco. He reports that he does not drink alcohol or use drugs.  History reviewed. No pertinent family history.  Medications: Patient's Medications  New Prescriptions   No medications on file  Previous Medications   AMLODIPINE (NORVASC) 10 MG TABLET    Take 1 tablet (10 mg total) by mouth daily. For high blood pressure   ATORVASTATIN (LIPITOR) 10 MG TABLET    Take one tablet by mouth once daily for cholesterol   CARVEDILOL (COREG) 6.25 MG TABLET    Take 1 tablet (6.25 mg total) by mouth 2 (two) times daily with a meal. For high blood pressure   EMPAGLIFLOZIN (JARDIANCE) 10 MG TABS TABLET    Take 10 mg by mouth daily.   GLIPIZIDE (GLUCOTROL) 5 MG TABLET    Take 1 tablet (5 mg total) by  mouth daily before breakfast.   LISINOPRIL (PRINIVIL,ZESTRIL) 40 MG TABLET    Take 1 tablet (40 mg total) by mouth daily. For high blood pressure   METFORMIN (GLUCOPHAGE) 500 MG TABLET    TAKE 1 TABLET BY MOUTH 2 TIMES A DAY WITH A MEAL for diabetes   OMEPRAZOLE (PRILOSEC) 20 MG CAPSULE    Take 1 capsule (20 mg total) by mouth as needed.   POTASSIUM CHLORIDE SA (K-DUR,KLOR-CON) 20 MEQ TABLET    Take 20 mEq by mouth 2 (two) times daily.   RIVAROXABAN (XARELTO) 20 MG TABS TABLET    Take 1 tablet (20 mg total) by mouth daily with supper.   TRAMADOL (ULTRAM) 50 MG TABLET    Take 1 tablet (50 mg total) by mouth every 8 (eight) hours as needed. for pain   TRUE  METRIX BLOOD GLUCOSE TEST TEST STRIP    CHECK BLOOD SUGAR TWICE PER DAY   TRUEPLUS LANCETS 30G MISC    USE TO TEST BLOOD SUGAR 2 TIMES A DAY  Modified Medications   Modified Medication Previous Medication   HYDRALAZINE (APRESOLINE) 25 MG TABLET hydrALAZINE (APRESOLINE) 10 MG tablet      Take 1 tablet (25 mg total) by mouth 3 (three) times daily.    Take 20 mg by mouth 3 (three) times daily.  Discontinued Medications   HYDRALAZINE (APRESOLINE) 10 MG TABLET    Take 1 tablet (10 mg total) by mouth 3 (three) times daily.   POLY-IRON 150 150 MG CAPSULE    TAKE 1 CAPSULE BY MOUTH 2 TIMES DAILY.   VITAMIN B-12 (CYANOCOBALAMIN) 1000 MCG TABLET    Take 1,000 mcg by mouth daily.     Physical Exam:  Vitals:   02/20/16 1434  BP: (!) 142/84  Pulse: 73  Resp: 18  Temp: 97.9 F (36.6 C)  TempSrc: Oral  SpO2: 94%  Weight: (!) 341 lb 6.4 oz (154.9 kg)  Height: 5\' 9"  (1.753 m)   Body mass index is 50.42 kg/m.  Physical Exam  Constitutional: He is oriented to person, place, and time. He appears well-developed and well-nourished.  HENT:  Head: Normocephalic and atraumatic.  Mouth/Throat: No oropharyngeal exudate.  Eyes: Pupils are equal, round, and reactive to light.  Neck: Normal range of motion. Neck supple. Carotid bruit is not present.  Cardiovascular: Normal rate, regular rhythm and normal heart sounds.   Pulmonary/Chest: Effort normal and breath sounds normal. No respiratory distress. He has no wheezes. He has no rales. He exhibits no tenderness.  Abdominal: Soft. Bowel sounds are normal. He exhibits no abdominal bruit and no pulsatile midline mass.  Musculoskeletal: Normal range of motion. He exhibits no edema or tenderness.  Neurological: He is alert and oriented to person, place, and time. No sensory deficit.  Skin: Skin is warm and dry.  Psychiatric: He has a normal mood and affect. His behavior is normal. Judgment and thought content normal.   Labs reviewed: Basic Metabolic  Panel:  Recent Labs  11/25/15 0153  11/26/15 0222 12/04/15 0840 12/16/15 0944  NA  --   < > 138 139 141  K  --   < > 3.2* 3.1* 3.7  CL  --   < > 101 106 107  CO2  --   < > 26 24 24   GLUCOSE  --   < > 355* 307* 223*  BUN  --   < > 19 17 15   CREATININE  --   < > 1.31* 1.17 0.93  CALCIUM  --   < >  8.4* 8.8* 8.9  MG 2.0  --   --   --   --   < > = values in this interval not displayed. Liver Function Tests:  Recent Labs  06/28/15 0942 09/30/15 0952 11/01/15 0936  AST 13 10 13   ALT 10 12 10   ALKPHOS 74 98 70  BILITOT 0.5 0.6 0.5  PROT 7.1 7.0 7.2  ALBUMIN 4.1 3.7 3.9   No results for input(s): LIPASE, AMYLASE in the last 8760 hours. No results for input(s): AMMONIA in the last 8760 hours. CBC:  Recent Labs  11/23/15 1205  11/26/15 0222 12/04/15 0840 01/23/16 1452  WBC 7.7  < > 8.8 8.6 7.2  NEUTROABS 5.6  --   --  6.6 4.6  HGB 13.5  < > 12.4* 13.0 13.8  HCT 42.2  < > 40.5 39.9 42.6  MCV 83.1  < > 85.8 82.8 82  PLT 229  < > 228 289 270  < > = values in this interval not displayed. Lipid Panel:  Recent Labs  03/08/15 0834  CHOL 153  HDL 52  LDLCALC 87  TRIG 70  CHOLHDL 2.9   TSH: No results for input(s): TSH in the last 8760 hours. A1C: Lab Results  Component Value Date   HGBA1C 8.9 (H) 11/24/2015     Assessment/Plan 1. Essential hypertension - Improved. Continue Norvasc, Coreg, Lisinopril, and Hydralazine. - Continue to keep a log of your BP and bring with you to your next appointment.  - Continue dietary modifications and increase exercise.  - hydrALAZINE (APRESOLINE) 25 MG tablet; Take 1 tablet (25 mg total) by mouth 3 (three) times daily.  Dispense: 90 tablet; Refill: 3  2. Type 2 diabetes mellitus with chronic kidney disease, without long-term current use of insulin, unspecified CKD stage ([place]) - Uncontrolled. HgbA1c 8.9 on 11/24/15. Continue Jardiance, glipizide, and metformin. - Continue dietary modifications and increase exercise.  -  Continue to keep a log of your blood sugars and bring with you to your next appointment.  - Hemoglobin A1c  3. PE/DVT conts on xarelto. Following with Dr Marin Olp, has follow up CT chest scheduled. Recommended for xarelto to be given for at least a year at this time.   4. Hx of Prostate cancer -follow up PSA by Dr Marin Olp, cancer currently in remission.    Follow-up in 3 months with lab work prior to appt.   Carlos American. Harle Battiest  [facility] [phone] 8 am - 5 pm) [phone] (after hours)

## 2016-02-25 MED FILL — hydrALAZINE HCL 25 MG TABS: 25 | 30 days supply | Qty: 90 | Fill #0

## 2016-03-03 ENCOUNTER — Ambulatory Visit: Payer: Medicare Other | Admitting: Hematology & Oncology

## 2016-03-03 ENCOUNTER — Other Ambulatory Visit: Payer: Medicare Other

## 2016-03-03 ENCOUNTER — Encounter (HOSPITAL_BASED_OUTPATIENT_CLINIC_OR_DEPARTMENT_OTHER): Payer: Self-pay

## 2016-03-03 ENCOUNTER — Ambulatory Visit (HOSPITAL_BASED_OUTPATIENT_CLINIC_OR_DEPARTMENT_OTHER)
Admission: RE | Admit: 2016-03-03 | Discharge: 2016-03-03 | Disposition: A | Discharge status: Home / Self Care | Payer: Medicare Other | Source: Ambulatory Visit | Attending: Hematology & Oncology | Admitting: Hematology & Oncology

## 2016-03-03 ENCOUNTER — Other Ambulatory Visit (HOSPITAL_BASED_OUTPATIENT_CLINIC_OR_DEPARTMENT_OTHER): Payer: Medicare Other

## 2016-03-03 DIAGNOSIS — I82402 Acute embolism and thrombosis of unspecified deep veins of left lower extremity: Secondary | ICD-10-CM | POA: Diagnosis not present

## 2016-03-03 DIAGNOSIS — I2782 Chronic pulmonary embolism: Secondary | ICD-10-CM

## 2016-03-03 DIAGNOSIS — I2699 Other pulmonary embolism without acute cor pulmonale: Secondary | ICD-10-CM | POA: Diagnosis not present

## 2016-03-03 DIAGNOSIS — C61 Malignant neoplasm of prostate: Secondary | ICD-10-CM

## 2016-03-03 DIAGNOSIS — I2602 Saddle embolus of pulmonary artery with acute cor pulmonale: Secondary | ICD-10-CM

## 2016-03-03 DIAGNOSIS — I824Y2 Acute embolism and thrombosis of unspecified deep veins of left proximal lower extremity: Secondary | ICD-10-CM

## 2016-03-03 DIAGNOSIS — I82412 Acute embolism and thrombosis of left femoral vein: Secondary | ICD-10-CM | POA: Insufficient documentation

## 2016-03-03 DIAGNOSIS — R0602 Shortness of breath: Secondary | ICD-10-CM | POA: Diagnosis not present

## 2016-03-03 DIAGNOSIS — R918 Other nonspecific abnormal finding of lung field: Secondary | ICD-10-CM | POA: Insufficient documentation

## 2016-03-03 DIAGNOSIS — I82432 Acute embolism and thrombosis of left popliteal vein: Secondary | ICD-10-CM | POA: Diagnosis not present

## 2016-03-03 LAB — CBC WITH DIFFERENTIAL (CANCER CENTER ONLY)
BASO#: 0 10*3/uL (ref 0.0–0.2)
BASO%: 0.3 % (ref 0.0–2.0)
EOS%: 0.2 % (ref 0.0–7.0)
Eosinophils Absolute: 0 10*3/uL (ref 0.0–0.5)
HCT: 41.5 % (ref 38.7–49.9)
HGB: 13.2 g/dL (ref 13.0–17.1)
LYMPH#: 1.2 10*3/uL (ref 0.9–3.3)
LYMPH%: 12.2 % — ABNORMAL LOW (ref 14.0–48.0)
MCH: 26.7 pg — ABNORMAL LOW (ref 28.0–33.4)
MCHC: 31.8 g/dL — ABNORMAL LOW (ref 32.0–35.9)
MCV: 84 fL (ref 82–98)
MONO#: 0.6 10*3/uL (ref 0.1–0.9)
MONO%: 6.5 % (ref 0.0–13.0)
NEUT#: 7.8 10*3/uL — ABNORMAL HIGH (ref 1.5–6.5)
NEUT%: 80.8 % — ABNORMAL HIGH (ref 40.0–80.0)
Platelets: 241 10*3/uL (ref 145–400)
RBC: 4.95 10*6/uL (ref 4.20–5.70)
RDW: 15 % (ref 11.1–15.7)
WBC: 9.7 10*3/uL (ref 4.0–10.0)

## 2016-03-03 LAB — CMP (CANCER CENTER ONLY)
ALT(SGPT): 17 U/L (ref 10–47)
AST: 15 U/L (ref 11–38)
Albumin: 3.6 g/dL (ref 3.3–5.5)
Alkaline Phosphatase: 85 U/L — ABNORMAL HIGH (ref 26–84)
BUN, Bld: 12 mg/dL (ref 7–22)
CO2: 28 mEq/L (ref 18–33)
Calcium: 8.8 mg/dL (ref 8.0–10.3)
Chloride: 103 mEq/L (ref 98–108)
Creat: 0.9 mg/dl (ref 0.6–1.2)
Glucose, Bld: 272 mg/dL — ABNORMAL HIGH (ref 73–118)
Potassium: 3.3 mEq/L (ref 3.3–4.7)
Sodium: 141 mEq/L (ref 128–145)
Total Bilirubin: 0.9 mg/dl (ref 0.20–1.60)
Total Protein: 7.4 g/dL (ref 6.4–8.1)

## 2016-03-03 MED ORDER — IOPAMIDOL (ISOVUE-370) INJECTION 76%
100.0000 mL | Freq: Once | INTRAVENOUS | Status: AC | PRN
  Administered 2016-03-03: 100 mL via INTRAVENOUS

## 2016-03-04 LAB — PROTEIN S ACTIVITY: Protein S-Functional: 120 % (ref 63–140)

## 2016-03-04 LAB — ANTITHROMBIN III: Antithrombin Activity: 118 % (ref 75–135)

## 2016-03-04 LAB — PROTEIN S, TOTAL: Protein S, Total: 101 % (ref 60–150)

## 2016-03-04 LAB — D-DIMER, QUANTITATIVE: D-DIMER: 0.23 mg/L FEU (ref 0.00–0.49)

## 2016-03-04 LAB — PROTEIN C ACTIVITY: Protein C-Functional: 121 % (ref 73–180)

## 2016-03-05 LAB — BETA-2-GLYCOPROTEIN I ABS, IGG/M/A
Beta-2 Glyco 1 IgA: <9 GPI IgA units (ref 0–25)
Beta-2 Glyco 1 IgM: <9 GPI IgM units (ref 0–32)
Beta-2 Glycoprotein I Ab, IgG: <9 GPI IgG units (ref 0–20)

## 2016-03-05 LAB — CARDIOLIPIN ANTIBODIES, IGG, IGM, IGA
Anticardiolipin Ab,IgA,Qn: <9 APL U/mL (ref 0–11)
Anticardiolipin Ab,IgG,Qn: <9 GPL U/mL (ref 0–14)
Anticardiolipin Ab,IgM,Qn: <9 MPL U/mL (ref 0–12)

## 2016-03-06 LAB — LUPUS ANTICOAGULANT PANEL
PTT-LA: 36.3 s (ref 0.0–51.9)
dRVVT Confirm: 1.3 ratio — ABNORMAL HIGH (ref 0.8–1.2)
dRVVT Mix: 48.1 s — ABNORMAL HIGH (ref 0.0–47.0)
dRVVT: 61.5 s — ABNORMAL HIGH (ref 0.0–47.0)

## 2016-03-06 LAB — PROTEIN C, TOTAL: Protein C Antigen: 76 % (ref 60–150)

## 2016-03-07 ENCOUNTER — Emergency Department (HOSPITAL_BASED_OUTPATIENT_CLINIC_OR_DEPARTMENT_OTHER)
Admission: EM | Admit: 2016-03-07 | Discharge: 2016-03-07 | Disposition: A | Discharge status: Home / Self Care | Payer: Medicare Other | Attending: Emergency Medicine | Admitting: Emergency Medicine

## 2016-03-07 ENCOUNTER — Encounter (HOSPITAL_BASED_OUTPATIENT_CLINIC_OR_DEPARTMENT_OTHER): Payer: Self-pay | Admitting: *Deleted

## 2016-03-07 ENCOUNTER — Emergency Department (HOSPITAL_BASED_OUTPATIENT_CLINIC_OR_DEPARTMENT_OTHER): Payer: Medicare Other

## 2016-03-07 DIAGNOSIS — R05 Cough: Secondary | ICD-10-CM | POA: Diagnosis present

## 2016-03-07 DIAGNOSIS — Z8546 Personal history of malignant neoplasm of prostate: Secondary | ICD-10-CM | POA: Insufficient documentation

## 2016-03-07 DIAGNOSIS — J181 Lobar pneumonia, unspecified organism: Secondary | ICD-10-CM | POA: Diagnosis not present

## 2016-03-07 DIAGNOSIS — Z7901 Long term (current) use of anticoagulants: Secondary | ICD-10-CM | POA: Diagnosis not present

## 2016-03-07 DIAGNOSIS — E1122 Type 2 diabetes mellitus with diabetic chronic kidney disease: Secondary | ICD-10-CM | POA: Insufficient documentation

## 2016-03-07 DIAGNOSIS — N189 Chronic kidney disease, unspecified: Secondary | ICD-10-CM | POA: Insufficient documentation

## 2016-03-07 DIAGNOSIS — E876 Hypokalemia: Secondary | ICD-10-CM | POA: Diagnosis not present

## 2016-03-07 DIAGNOSIS — J189 Pneumonia, unspecified organism: Secondary | ICD-10-CM

## 2016-03-07 DIAGNOSIS — Z7984 Long term (current) use of oral hypoglycemic drugs: Secondary | ICD-10-CM | POA: Insufficient documentation

## 2016-03-07 DIAGNOSIS — R059 Cough, unspecified: Secondary | ICD-10-CM

## 2016-03-07 DIAGNOSIS — I129 Hypertensive chronic kidney disease with stage 1 through stage 4 chronic kidney disease, or unspecified chronic kidney disease: Secondary | ICD-10-CM | POA: Insufficient documentation

## 2016-03-07 LAB — BASIC METABOLIC PANEL
Anion gap: 8 (ref 5–15)
BUN: 15 mg/dL (ref 6–20)
CO2: 30 mmol/L (ref 22–32)
Calcium: 8.7 mg/dL — ABNORMAL LOW (ref 8.9–10.3)
Chloride: 100 mmol/L — ABNORMAL LOW (ref 101–111)
Creatinine, Ser: 1.21 mg/dL (ref 0.61–1.24)
GFR calc Af Amer: >60 mL/min (ref >60)
GFR calc non Af Amer: >60 mL/min (ref >60)
Glucose, Bld: 211 mg/dL — ABNORMAL HIGH (ref 65–99)
Potassium: 3.1 mmol/L — ABNORMAL LOW (ref 3.5–5.1)
Sodium: 138 mmol/L (ref 135–145)

## 2016-03-07 LAB — CBC WITH DIFFERENTIAL/PLATELET
Basophils Absolute: 0 10*3/uL (ref 0.0–0.1)
Basophils Relative: 0 %
Eosinophils Absolute: 0.1 10*3/uL (ref 0.0–0.7)
Eosinophils Relative: 1 %
HCT: 41 % (ref 39.0–52.0)
Hemoglobin: 12.9 g/dL — ABNORMAL LOW (ref 13.0–17.0)
Lymphocytes Relative: 24 %
Lymphs Abs: 1.7 10*3/uL (ref 0.7–4.0)
MCH: 26.1 pg (ref 26.0–34.0)
MCHC: 31.5 g/dL (ref 30.0–36.0)
MCV: 83 fL (ref 78.0–100.0)
Monocytes Absolute: 0.6 10*3/uL (ref 0.1–1.0)
Monocytes Relative: 8 %
Neutro Abs: 4.8 10*3/uL (ref 1.7–7.7)
Neutrophils Relative %: 67 %
Platelets: 273 10*3/uL (ref 150–400)
RBC: 4.94 MIL/uL (ref 4.22–5.81)
RDW: 14.8 % (ref 11.5–15.5)
WBC: 7.2 10*3/uL (ref 4.0–10.5)

## 2016-03-07 MED ORDER — BENZONATATE 100 MG PO CAPS: 100.0000 mg | ORAL_CAPSULE | Freq: Three times a day (TID) | ORAL | 0 refills | Status: DC

## 2016-03-07 MED ORDER — AZITHROMYCIN 250 MG PO TABS: 250.0000 mg | ORAL_TABLET | Freq: Every day | ORAL | 0 refills | Status: DC

## 2016-03-07 NOTE — ED Triage Notes (Signed)
Patient states he has had chest congestion with productive cough with yellow secretions for the last three months.  Was seen by Dr. Marin Olp four days ago and had CT of chest and blood work.  States he has not heard back from the doctor and he wants to know what is going on.

## 2016-03-07 NOTE — ED Notes (Signed)
Pt made aware to return if symptoms worsen or if any life threatening symptoms occur.   

## 2016-03-07 NOTE — Discharge Instructions (Signed)
Please take the antibiotic as prescribed. I have given you Tessalon for cough to take as prescribed. Please follow-up with your primary care doctor. Return to the ED if he develop any worsening shortness of breath, worsening chest pain, fevers or for any reason.

## 2016-03-07 NOTE — ED Provider Notes (Signed)
[facility] DEPT [facility] Provider Note   CSN: [id number] Arrival date & time: 03/07/16  1043     History   Chief Complaint Chief Complaint  Patient presents with  . Cough    Chest congestion    HPI Thomas [name] is a 66 y.o. male.  66 year old [place] American male with a past medical history significant for DVT, PE currently owns a relative, hypertension, diabetes presents to the ED today with URI symptoms including rhinorrhea, postnasal drip, cough. Patient states that his symptoms have been consistent for the past 3 months. States he is coughing up thick yellow sputum. He denies any sick contacts. He denies any fevers. Patient was seen by his primary care doctor proximally 4 days ago. At that time he had blood work, chest CT, ultrasound of lower x-rays. Patient was diagnosed with blood clot approximately 4 months ago. At that time he was found to have lower x-ray left DVT and a PE. Patient was placed on Xarelto. Patient had a follow-up appointment with his PCP 4 days ago for repeat imaging. Patient has not received reports back from the blood work and imaging. Reviewed by myself on Epic patient had a negative d-dimer. His pulmonary embolism was chronic and not enlarging. There was signs for right focal infiltrate of the upper lobe concerning for pneumonia. Patient has not started antibiotics. Patient been trying Mucinex at home with little relief of his cough. Patient denies any fever, chills, headache, vision changes, lightheadedness, dizziness, chest pain, shortness of breath, abdominal pain, nausea, emesis, urinary symptoms, change in bowel habits, numbness/tingling.      Past Medical History:  Diagnosis Date  . Arthritis   . Cancer [facility]) 2010   Prostate  . Chronic kidney disease   . Diabetes mellitus   . DVT (deep vein thrombosis) in pregnancy ([place])   . Hypertension   . PE (pulmonary thromboembolism) [facility])     Patient Active Problem List   Diagnosis Date Noted  .  DVT, lower extremity, proximal, acute, left ([place]) 01/23/2016  . Hematuria 12/12/2015  . Pulmonary embolism ([place]) 11/23/2015  . Type 2 diabetes mellitus with chronic kidney disease, without long-term current use of insulin ([place]) 12/05/2014  . Benign prostatic hyperplasia with urinary obstruction 07/25/2014  . Urine retention 07/25/2014  . Urinary tract infection 07/09/2014  . Anemia, iron deficiency 11/22/2013  . Urinary retention 11/01/2013  . Incomplete emptying of bladder 07/26/2013  . Nocturia 07/26/2013  . Other nonspecific finding on examination of urine 07/26/2013  . Sleep apnea 07/26/2013  . Urethral stricture 07/26/2013  . Malignant neoplasm of prostate ([place]) 07/04/2013  . Bladder neck contracture 07/04/2013  . Severe obesity (BMI >= 40) ([place]) 05/23/2013  . B12 deficiency 05/23/2013  . Hyperlipidemia with target LDL less than 100 09/02/2012  . HTN (hypertension) 05/26/2012  . Osteoarthritis, knee 05/26/2012  . MORBID OBESITY 08/22/2008    Past Surgical History:  Procedure Laterality Date  . HERNIA REPAIR    . KNEE SURGERY    . PROSTATE SURGERY    . RADIOACTIVE SEED IMPLANT    . SHOULDER SURGERY    . uretha surgery-2014         Home Medications    Prior to Admission medications   Medication Sig Start Date End Date Taking? Authorizing Provider  amLODipine (NORVASC) 10 MG tablet Take 1 tablet (10 mg total) by mouth daily. For high blood pressure 11/04/15   [name], NP  atorvastatin (LIPITOR) 10 MG tablet Take one tablet by  mouth once daily for cholesterol 09/30/15   [name], NP  carvedilol (COREG) 6.25 MG tablet Take 1 tablet (6.25 mg total) by mouth 2 (two) times daily with a meal. For high blood pressure 01/03/16   [name], NP  empagliflozin (JARDIANCE) 10 MG TABS tablet Take 10 mg by mouth daily.    Historical Provider, MD  glipiZIDE (GLUCOTROL) 5 MG tablet Take 1 tablet (5 mg total) by mouth daily before breakfast. 01/03/16   [name], NP  hydrALAZINE (APRESOLINE) 25 MG tablet Take 1 tablet (25 mg total) by mouth 3 (three) times daily. 02/20/16   [name], NP  lisinopril (PRINIVIL,ZESTRIL) 40 MG tablet Take 1 tablet (40 mg total) by mouth daily. For high blood pressure 09/30/15   [name], NP  metFORMIN (GLUCOPHAGE) 500 MG tablet TAKE 1 TABLET BY MOUTH 2 TIMES A DAY WITH A MEAL for diabetes 01/21/16   Estill Dooms, MD  omeprazole (PRILOSEC) 20 MG capsule Take 1 capsule (20 mg total) by mouth as needed. 05/24/14   [name], NP  potassium chloride SA (K-DUR,KLOR-CON) 20 MEQ tablet Take 20 mEq by mouth 2 (two) times daily.    Historical Provider, MD  rivaroxaban (XARELTO) 20 MG TABS tablet Take 1 tablet (20 mg total) by mouth daily with supper. 01/23/16   Volanda Napoleon, MD  traMADol (ULTRAM) 50 MG tablet Take 1 tablet (50 mg total) by mouth every 8 (eight) hours as needed. for pain 09/30/15   [name], NP  TRUE METRIX BLOOD GLUCOSE TEST test strip CHECK BLOOD SUGAR TWICE PER DAY 10/24/15   [name], NP  TRUEPLUS LANCETS 30G MISC USE TO TEST BLOOD SUGAR 2 TIMES A DAY 09/12/14   [name], NP    Family History No family history on file.  Social History Social History  Substance Use Topics  . Smoking status: Never Smoker  . Smokeless tobacco: Never Used  . Alcohol use No     Comment: never     Allergies   Patient has no known allergies.   Review of Systems Review of Systems  Constitutional: Negative for chills and fever.  HENT: Positive for congestion, postnasal drip and rhinorrhea. Negative for ear pain, sinus pain, sinus pressure and sore throat.   Eyes: Negative for visual disturbance.  Respiratory: Positive for cough. Negative for shortness of breath and wheezing.   Cardiovascular: Negative for chest pain and palpitations.  Gastrointestinal: Negative for abdominal pain, diarrhea, nausea and vomiting.  Genitourinary: Negative for dysuria, frequency and  urgency.  Neurological: Negative for dizziness, syncope, light-headedness, numbness and headaches.  All other systems reviewed and are negative.    Physical Exam Updated Vital Signs BP 150/74 (BP Location: Right Arm)   Pulse 68   Temp 98.3 F (36.8 C) (Oral)   Resp 20   SpO2 94%   Physical Exam  Constitutional: He appears well-developed and well-nourished. No distress.  Obese body habitus.  HENT:  Head: Normocephalic and atraumatic.  Right Ear: Tympanic membrane, external ear and ear canal normal.  Left Ear: Tympanic membrane, external ear and ear canal normal.  Nose: Mucosal edema and rhinorrhea present. Right sinus exhibits no maxillary sinus tenderness and no frontal sinus tenderness. Left sinus exhibits no maxillary sinus tenderness and no frontal sinus tenderness.  Mouth/Throat: Uvula is midline, oropharynx is clear and moist and mucous membranes are normal.  Eyes: Conjunctivae and EOM are normal. Pupils are equal, round, and  reactive to light. Right eye exhibits no discharge. Left eye exhibits no discharge. No scleral icterus.  Neck: Normal range of motion. Neck supple. No thyromegaly present.  Cardiovascular: Normal rate, regular rhythm, normal heart sounds and intact distal pulses.  Exam reveals no gallop and no friction rub.   No murmur heard. Pulmonary/Chest: Effort normal and breath sounds normal. No respiratory distress. He has no wheezes.  Lungs are clear to auscultation bilaterally. He does have diminished breath sounds in all lung fields. No cough noted on my exam. No course sounds or wheezing noted. Patient has no chest wall tenderness.  Abdominal: Soft. Bowel sounds are normal. He exhibits no distension. There is no tenderness. There is no rebound and no guarding.  Musculoskeletal: Normal range of motion.  Lymphadenopathy:    He has no cervical adenopathy.  Neurological: He is alert.  Skin: Skin is warm and dry. Capillary refill takes less than 2 seconds.  Nursing  note and vitals reviewed.    ED Treatments / Results  Labs (all labs ordered are listed, but only abnormal results are displayed) Labs Reviewed  BASIC METABOLIC PANEL - Abnormal; Notable for the following:       Result Value   Potassium 3.1 (*)    Chloride 100 (*)    Glucose, Bld 211 (*)    Calcium 8.7 (*)    All other components within normal limits  CBC WITH DIFFERENTIAL/PLATELET - Abnormal; Notable for the following:    Hemoglobin 12.9 (*)    All other components within normal limits    EKG  EKG Interpretation None       Radiology Dg Chest 2 View  Result Date: 03/07/2016 CLINICAL DATA:  Congestion and cough. EXAM: CHEST  2 VIEW COMPARISON:  11/23/2015 FINDINGS: Stable heart size at the upper limits of normal. Stable pulmonary venous hypertensive changes without evidence of overt pulmonary edema or pleural fluid. No focal airspace consolidation. The thoracic spine shows stable degenerative disc disease. IMPRESSION: Stable pulmonary venous hypertensive changes in both lungs without evidence of overt edema. Electronically Signed   By: Aletta Edouard M.D.   On: 03/07/2016 15:00    Procedures Procedures (including critical care time)  Medications Ordered in ED Medications - No data to display   Initial Impression / Assessment and Plan / ED Course  I have reviewed the triage vital signs and the nursing notes.  Pertinent labs & imaging results that were available during my care of the patient were reviewed by me and considered in my medical decision making (see chart for details).    Patient presents to the ED with cough for the past 3 months. Yellow sputum production. Patient was diagnosed 4 months ago with DVT and PE. Currently owns a relative. Patient had thorough workup by primary care doctor 4 days ago with repeat ultrasound and CAT scan. He is also seeing a hematologist for coagulopathies. Patient states that he is not getting any results from his test and has  continued to have a cough. On review the medical history CAT scan shows improving PEs. The CAT scan 4 days ago does mention concern for focal infiltrate in the right upper lobe concerning for pneumonia. Chest x-ray today reveals no focal infiltrate. The patient continues to have a cough. Lungs CTAB.  He has had stable pulmonary hypertension. Patient denies any fevers. Patient has no leukocytosis today. Given that this has been ongoing for 3 months and x-ray reveals no focal infiltrate today have very low suspicion for pneumonia.  However given radiologist reading approximately 4 days ago we'll start patient on a course of azithromycin. Cough could likely be due to persistent chronic pulmonary embolism. Given patient cough medicine. I have encouraged patient to follow up with his primary care doctor for rest of his blood work. Patient's d-dimer was negative 4 days ago. The patient is not hypoxic or tachypnea, ED. Currently on xarelto. Patient's potassium is 3.1. He owns on potassium supplements at home. Patient states that he was taking it twice a day however at his last doctor's appointment and potassium was normal. States he reduce his potassium to once a day. Encourage patient to continue taking his potassium twice a day and follow up with primary care doctor for recheck. All other labs unremarkable. Pt is hemodynamically stable, in NAD, & able to ambulate in the ED. Pain has been managed & has no complaints prior to dc. Pt is comfortable with above plan and is stable for discharge at this time. All questions were answered prior to disposition. Strict return precautions for f/u to the ED were discussed. Pt discussed with Dr. Canary Brim who is agreeable to the above plan.   Final Clinical Impressions(s) / ED Diagnoses   Final diagnoses:  Cough  Hypokalemia  Community acquired pneumonia of right upper lobe of lung ([place])    New Prescriptions Discharge Medication List as of 03/07/2016  4:12 PM    START taking  these medications   Details  azithromycin (ZITHROMAX) 250 MG tablet Take 1 tablet (250 mg total) by mouth daily. Take first 2 tablets together, then 1 every day until finished., Starting Sat 03/07/2016, Print    benzonatate (TESSALON) 100 MG capsule Take 1 capsule (100 mg total) by mouth every 8 (eight) hours., Starting Sat 03/07/2016, Print         Doristine Devoid, PA-C 03/07/16 1631    Alfonzo Beers, MD 03/07/16 [phone]

## 2016-03-09 ENCOUNTER — Other Ambulatory Visit: Payer: Self-pay | Admitting: Hematology & Oncology

## 2016-03-09 DIAGNOSIS — I2782 Chronic pulmonary embolism: Secondary | ICD-10-CM

## 2016-03-09 LAB — PROTHROMBIN GENE MUTATION

## 2016-03-09 LAB — FACTOR 5 LEIDEN

## 2016-03-09 MED FILL — KLOR-CON M20 TABLET: 20 | 42 days supply | Qty: 45 | Fill #2

## 2016-03-12 ENCOUNTER — Other Ambulatory Visit: Payer: Self-pay | Admitting: Internal Medicine

## 2016-03-24 MED FILL — OMEPRAZOLE DR 20 MG CAPSULE: 20 | 30 days supply | Qty: 30 | Fill #0

## 2016-03-31 ENCOUNTER — Ambulatory Visit: Payer: Medicare Other | Admitting: Hematology & Oncology

## 2016-03-31 ENCOUNTER — Other Ambulatory Visit: Payer: Medicare Other

## 2016-04-07 ENCOUNTER — Other Ambulatory Visit (HOSPITAL_BASED_OUTPATIENT_CLINIC_OR_DEPARTMENT_OTHER): Payer: Medicare Other

## 2016-04-07 ENCOUNTER — Ambulatory Visit (HOSPITAL_BASED_OUTPATIENT_CLINIC_OR_DEPARTMENT_OTHER): Payer: Medicare Other | Admitting: Hematology & Oncology

## 2016-04-07 VITALS — BP 114/53 | HR 72 | Temp 97.7°F | Resp 18 | Wt 317.0 lb

## 2016-04-07 DIAGNOSIS — I824Y2 Acute embolism and thrombosis of unspecified deep veins of left proximal lower extremity: Secondary | ICD-10-CM

## 2016-04-07 DIAGNOSIS — I2601 Septic pulmonary embolism with acute cor pulmonale: Secondary | ICD-10-CM

## 2016-04-07 DIAGNOSIS — I2782 Chronic pulmonary embolism: Secondary | ICD-10-CM

## 2016-04-07 DIAGNOSIS — C61 Malignant neoplasm of prostate: Secondary | ICD-10-CM

## 2016-04-07 DIAGNOSIS — I2699 Other pulmonary embolism without acute cor pulmonale: Secondary | ICD-10-CM

## 2016-04-07 LAB — CBC WITH DIFFERENTIAL (CANCER CENTER ONLY)
BASO#: 0 10*3/uL (ref 0.0–0.2)
BASO%: 0.3 % (ref 0.0–2.0)
EOS%: 0.5 % (ref 0.0–7.0)
Eosinophils Absolute: 0 10*3/uL (ref 0.0–0.5)
HCT: 44.4 % (ref 38.7–49.9)
HGB: 14.5 g/dL (ref 13.0–17.1)
LYMPH#: 1.9 10*3/uL (ref 0.9–3.3)
LYMPH%: 21.8 % (ref 14.0–48.0)
MCH: 26.7 pg — ABNORMAL LOW (ref 28.0–33.4)
MCHC: 32.7 g/dL (ref 32.0–35.9)
MCV: 82 fL (ref 82–98)
MONO#: 0.5 10*3/uL (ref 0.1–0.9)
MONO%: 5.4 % (ref 0.0–13.0)
NEUT#: 6.2 10*3/uL (ref 1.5–6.5)
NEUT%: 72 % (ref 40.0–80.0)
Platelets: 310 10*3/uL (ref 145–400)
RBC: 5.43 10*6/uL (ref 4.20–5.70)
RDW: 15.3 % (ref 11.1–15.7)
WBC: 8.7 10*3/uL (ref 4.0–10.0)

## 2016-04-07 LAB — COMPREHENSIVE METABOLIC PANEL
ALT: 21 U/L (ref 0–55)
AST: 14 U/L (ref 5–34)
Albumin: 3.7 g/dL (ref 3.5–5.0)
Alkaline Phosphatase: 73 U/L (ref 40–150)
Anion Gap: 12 mEq/L — ABNORMAL HIGH (ref 3–11)
BUN: 12.2 mg/dL (ref 7.0–26.0)
CO2: 22 mEq/L (ref 22–29)
Calcium: 9.3 mg/dL (ref 8.4–10.4)
Chloride: 107 mEq/L (ref 98–109)
Creatinine: 1.6 mg/dL — ABNORMAL HIGH (ref 0.7–1.3)
EGFR: 51 mL/min/{1.73_m2} — ABNORMAL LOW (ref >90)
Glucose: 173 mg/dl — ABNORMAL HIGH (ref 70–140)
Potassium: 4.1 mEq/L (ref 3.5–5.1)
Sodium: 141 mEq/L (ref 136–145)
Total Bilirubin: 0.71 mg/dL (ref 0.20–1.20)
Total Protein: 7.9 g/dL (ref 6.4–8.3)

## 2016-04-07 NOTE — Progress Notes (Signed)
Hematology and Oncology Follow Up Visit  Thomas [name] [id number] 1950/03/06 66 y.o. 04/07/2016   Principle Diagnosis:   Bilateral pulmonary emboli  Left femoral vein and popliteal vein thrombus  Possible lupus anticoagulant  Current Therapy:    Xarelto 20 mg by mouth daily-to complete 1 year therapy in October 2018     Interim History:  Thomas [name] is back for follow-up. We did go ahead and get a CT angiogram and Doppler of his left leg back in January. The CT angiogram showed no acute pulmonary embolus. There is a small amount of thrombus noted in the left lower lung and along the bifurcation of the right upper and lower lung. The Doppler of the left leg showed improving nonocclusive thrombus in the left femoral vein and the left popliteal vein.  He does wear a compression stocking to her 3 times a week. I told him to wear this every day.  We did hypercoagulable studies. He, of course, was found to be positive for a lupus anticoagulant. Again it seems like most of our patients are found to have this. I'm not sure if this is truly significant.  He does have a strong family history of breast cancer and prostate cancer. I think he probably needs to be checked for the BRCA mutation. We will see about sending him to genetic counseling.  He's had no bleeding. He's had no cough. He still gets short of breath on occasion.  There is no change in bowel or bladder habits.  His last PSA was less than 0.1. This was in December 2017.  Overall, his performance status is ECOG 1. He just completed a juice diet. He is lost about 30 pounds.  Medications:  Current Outpatient Prescriptions:  .  amLODipine (NORVASC) 10 MG tablet, Take 1 tablet (10 mg total) by mouth daily. For high blood pressure, Disp: 90 tablet, Rfl: 2 .  atorvastatin (LIPITOR) 10 MG tablet, Take one tablet by mouth once daily for cholesterol, Disp: 90 tablet, Rfl: 3 .  azithromycin (ZITHROMAX) 250 MG tablet, Take 1  tablet (250 mg total) by mouth daily. Take first 2 tablets together, then 1 every day until finished., Disp: 6 tablet, Rfl: 0 .  benzonatate (TESSALON) 100 MG capsule, Take 1 capsule (100 mg total) by mouth every 8 (eight) hours., Disp: 15 capsule, Rfl: 0 .  carvedilol (COREG) 6.25 MG tablet, Take 1 tablet (6.25 mg total) by mouth 2 (two) times daily with a meal. For high blood pressure, Disp: 180 tablet, Rfl: 2 .  empagliflozin (JARDIANCE) 10 MG TABS tablet, Take 10 mg by mouth daily., Disp: , Rfl:  .  glipiZIDE (GLUCOTROL) 5 MG tablet, Take 1 tablet (5 mg total) by mouth daily before breakfast., Disp: 90 tablet, Rfl: 2 .  hydrALAZINE (APRESOLINE) 25 MG tablet, Take 1 tablet (25 mg total) by mouth 3 (three) times daily., Disp: 90 tablet, Rfl: 3 .  lisinopril (PRINIVIL,ZESTRIL) 40 MG tablet, Take 1 tablet (40 mg total) by mouth daily. For high blood pressure, Disp: 90 tablet, Rfl: 1 .  metFORMIN (GLUCOPHAGE) 500 MG tablet, TAKE 1 TABLET BY MOUTH 2 TIMES A DAY WITH A MEAL for diabetes, Disp: 180 tablet, Rfl: 3 .  omeprazole (PRILOSEC) 20 MG capsule, Take 1 capsule (20 mg total) by mouth as needed., Disp: 30 capsule, Rfl: 3 .  omeprazole (PRILOSEC) 20 MG capsule, TAKE 1 CAPSULE BY MOUTH DAILY., Disp: 30 capsule, Rfl: 3 .  potassium chloride SA (K-DUR,KLOR-CON) 20 MEQ tablet, Take 20  mEq by mouth 2 (two) times daily., Disp: , Rfl:  .  rivaroxaban (XARELTO) 20 MG TABS tablet, Take 1 tablet (20 mg total) by mouth daily with supper., Disp: 30 tablet, Rfl: 12 .  traMADol (ULTRAM) 50 MG tablet, Take 1 tablet (50 mg total) by mouth every 8 (eight) hours as needed. for pain, Disp: 90 tablet, Rfl: 1 .  TRUE METRIX BLOOD GLUCOSE TEST test strip, CHECK BLOOD SUGAR TWICE PER DAY, Disp: 100 each, Rfl: PRN .  TRUEPLUS LANCETS 30G MISC, USE TO TEST BLOOD SUGAR 2 TIMES A DAY, Disp: 100 each, Rfl: 12  Allergies: No Known Allergies  Past Medical History, Surgical history, Social history, and Family History were  reviewed and updated.  Review of Systems: As above  Physical Exam:  weight is 317 lb (143.8 kg) (abnormal). His oral temperature is 97.7 F (36.5 C). His blood pressure is 114/53 (abnormal) and his pulse is 72. His respiration is 18 and oxygen saturation is 98%.   Wt Readings from Last 3 Encounters:  04/07/16 (!) 317 lb (143.8 kg)  02/20/16 (!) 341 lb 6.4 oz (154.9 kg)  01/23/16 (!) 336 lb (152.4 kg)     Head and neck exam shows no ocular or oral lesions. He has no palpable cervical or supraclavicular lymph nodes. Lungs are clear bilaterally. He has no rales, wheezes or rhonchi. Cardiac exam regular rate and rhythm with no murmurs, rubs or bruits. Abdomen is soft. He has good bowel sounds. There is no fluid wave. He is obese. There is no palpable liver or spleen tip. Back exam shows no tenderness over the spine, ribs or hips. Extremities shows no clubbing, cyanosis or edema. He does have some nonpitting edema of the left leg. He has a negative Homans sign in the left leg. No palpable venous cord is noted in his legs. Neurological exam shows no focal neurological deficits. Skin exam shows no rashes, ecchymoses or petechia.   Lab Results  Component Value Date   WBC 8.7 04/07/2016   HGB 14.5 04/07/2016   HCT 44.4 04/07/2016   MCV 82 04/07/2016   PLT 310 04/07/2016     Chemistry      Component Value Date/Time   NA 138 03/07/2016 1503   NA 141 03/03/2016 0927   K 3.1 (L) 03/07/2016 1503   K 3.3 03/03/2016 0927   CL 100 (L) 03/07/2016 1503   CL 103 03/03/2016 0927   CO2 30 03/07/2016 1503   CO2 28 03/03/2016 0927   BUN 15 03/07/2016 1503   BUN 12 03/03/2016 0927   CREATININE 1.21 03/07/2016 1503   CREATININE 0.9 03/03/2016 0927      Component Value Date/Time   CALCIUM 8.7 (L) 03/07/2016 1503   CALCIUM 8.8 03/03/2016 0927   ALKPHOS 85 (H) 03/03/2016 0927   AST 15 03/03/2016 0927   ALT 17 03/03/2016 0927   BILITOT 0.90 03/03/2016 0927         Impression and Plan: Thomas [name] is a 67 year old African-American male. 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 We'll have to recheck the the lupus anticoagulant studies we see him back.  I do not think we need any repeat CT angiograms or Dopplers for another 6 months.  He clearly would be a candidate for maintenance Xarelto after 1 year of therapeutic Xarelto.  I will see him back in 3 months.  We will make a referral to genetic counseling.     Volanda Napoleon, MD  2/20/201812:47 PM

## 2016-04-08 LAB — D-DIMER, QUANTITATIVE: D-DIMER: 0.24 mg/L FEU (ref 0.00–0.49)

## 2016-04-09 LAB — LUPUS ANTICOAGULANT PANEL
PTT-LA: 41.8 s (ref 0.0–51.9)
dRVVT Confirm: 2 ratio — ABNORMAL HIGH (ref 0.8–1.2)
dRVVT Mix: 67.5 s — ABNORMAL HIGH (ref 0.0–47.0)
dRVVT: 113.6 s — ABNORMAL HIGH (ref 0.0–47.0)

## 2016-04-28 ENCOUNTER — Telehealth: Payer: Self-pay | Admitting: *Deleted

## 2016-04-28 NOTE — Telephone Encounter (Signed)
Received fax from [facility] [phone] for prior Authorization for Jardiance 10mg . Filled out form and placed in [place] folder for review. To be faxed back to [phone]. Member ID: [id number]

## 2016-05-01 NOTE — Telephone Encounter (Signed)
Received fax from [facility] [phone] stating they need a written or oral statement from doctor or prescriber indicating why patient can't take any of the lower cost drugs [employer] offers. Need supporting statement faxed to [phone] Placed fax in [place] folder to review.  Member ID#: [id number] [place] ID#: [id number]

## 2016-05-04 NOTE — Telephone Encounter (Signed)
Received fax from [facility] [phone] stating they have APPROVED tier exception request for Jardiance at a Tier 1 copay until 02/15/2017.

## 2016-05-05 ENCOUNTER — Other Ambulatory Visit: Payer: Self-pay

## 2016-05-05 MED ORDER — EMPAGLIFLOZIN 10 MG PO TABS: 10.0000 mg | ORAL_TABLET | Freq: Every day | ORAL | 6 refills | Status: DC

## 2016-05-05 MED FILL — JARDIANCE 10 MG TABLET: 10 | 30 days supply | Qty: 30 | Fill #0

## 2016-05-05 NOTE — Telephone Encounter (Signed)
A fax was received from [facility] for refill of Jardiance 10 mg tablets. Rx was sent to pharmacy electronically. # 30 with 6 RF.

## 2016-05-06 ENCOUNTER — Telehealth: Payer: Self-pay | Admitting: Genetics

## 2016-05-06 ENCOUNTER — Encounter: Payer: Self-pay | Admitting: Genetics

## 2016-05-06 NOTE — Telephone Encounter (Signed)
A genetic counseling appt has been scheduled for the pt to see Vicente Males on 3/29 at 10am. Pt aware to arrive 15 minutes early. Pt voiced understanding and agreed to the appt date and time. Letter mailed

## 2016-05-07 ENCOUNTER — Other Ambulatory Visit: Payer: Self-pay | Admitting: Nurse Practitioner

## 2016-05-08 MED FILL — KLOR-CON M20 TABLET: 20 | 45 days supply | Qty: 45 | Fill #0

## 2016-05-11 MED FILL — LISINOPRIL 40 MG TABLET: 40 | 90 days supply | Qty: 90 | Fill #1

## 2016-05-14 ENCOUNTER — Other Ambulatory Visit: Payer: Medicare Other

## 2016-05-14 ENCOUNTER — Other Ambulatory Visit: Payer: Self-pay | Admitting: *Deleted

## 2016-05-14 ENCOUNTER — Encounter: Payer: Self-pay | Admitting: Genetics

## 2016-05-14 ENCOUNTER — Ambulatory Visit (HOSPITAL_BASED_OUTPATIENT_CLINIC_OR_DEPARTMENT_OTHER): Payer: Medicare Other | Admitting: Genetics

## 2016-05-14 DIAGNOSIS — Z8546 Personal history of malignant neoplasm of prostate: Secondary | ICD-10-CM | POA: Diagnosis not present

## 2016-05-14 DIAGNOSIS — Z8042 Family history of malignant neoplasm of prostate: Secondary | ICD-10-CM

## 2016-05-14 DIAGNOSIS — D509 Iron deficiency anemia, unspecified: Secondary | ICD-10-CM

## 2016-05-14 DIAGNOSIS — I2601 Septic pulmonary embolism with acute cor pulmonale: Secondary | ICD-10-CM

## 2016-05-14 DIAGNOSIS — C61 Malignant neoplasm of prostate: Secondary | ICD-10-CM

## 2016-05-14 DIAGNOSIS — Z8481 Family history of carrier of genetic disease: Secondary | ICD-10-CM | POA: Diagnosis not present

## 2016-05-14 DIAGNOSIS — I824Y2 Acute embolism and thrombosis of unspecified deep veins of left proximal lower extremity: Secondary | ICD-10-CM

## 2016-05-14 DIAGNOSIS — Z806 Family history of leukemia: Secondary | ICD-10-CM | POA: Diagnosis not present

## 2016-05-14 DIAGNOSIS — Z803 Family history of malignant neoplasm of breast: Secondary | ICD-10-CM | POA: Diagnosis not present

## 2016-05-14 NOTE — Progress Notes (Signed)
REFERRING PROVIDER: [name], NP [place], [place] [address]   Dr. Marin Olp  PRIMARY PROVIDER:  [name], NP  PRIMARY REASON FOR VISIT:  1. Personal history of prostate cancer   2. Family history of breast cancer   3. Family history of prostate cancer      HISTORY OF PRESENT ILLNESS:   Mr. Cudworth, a 66 y.o. male, was seen for a [place] cancer genetics consultation at the request of Dr. Dewaine Oats due to a personal and family history of cancer.  Mr. Hovater presents to clinic today to discuss the possibility of a hereditary predisposition to cancer, genetic testing, and to further clarify his future cancer risks, as well as potential cancer risks for family members. Mr. Perotti was accompanied to his appointment by his wife who assisted with reporting of family history.  In 2008, at the age of 7, Mr. Hoos was diagnosed with prostate cancer. This was treated with a radioactive seed implant. He reports that he has since been doing very well. Mr. Gosse also has a history of a hyperplastic polyp in 2005. He reports that he has not had another colonoscopy since then. I encouraged him to schedule a colonoscopy soon unless otherwise advised by his physicans.  Past Medical History:  Diagnosis Date  . Arthritis   . Cancer [facility]) 2010   Prostate  . Chronic kidney disease   . Diabetes mellitus   . DVT (deep vein thrombosis) in pregnancy ([place])   . Hypertension   . PE (pulmonary thromboembolism) ([place])     Past Surgical History:  Procedure Laterality Date  . HERNIA REPAIR    . KNEE SURGERY    . PROSTATE SURGERY    . RADIOACTIVE SEED IMPLANT    . SHOULDER SURGERY    . uretha surgery-2014      Social History   Social History  . Marital status: Married    Spouse name: N/A  . Number of children: N/A  . Years of education: N/A   Social History Main Topics  . Smoking status: Never Smoker  . Smokeless tobacco: Never Used  . Alcohol use No    Comment: never  . Drug use: No  . Sexual activity: Yes   Other Topics Concern  . Not on file   Social History Narrative  . No narrative on file     FAMILY HISTORY:  We obtained a detailed, 4-generation family history.  Significant diagnoses are listed below: Family History  Problem Relation Age of Onset  . Breast cancer Mother 38    d.89  . Breast cancer Sister 100    d.30  . Leukemia Brother 18    d.20  . Breast cancer Maternal Aunt 40    d.40s  . Lung cancer Maternal Uncle   . Prostate cancer Paternal Uncle   . Prostate cancer Brother     recurred recently at age 106  . Other Brother 15    spinal tumor  . Cervical cancer Other 22    d.22  . Cancer Sister 82    unspecified type  . Cancer Maternal Uncle 71    unspecified type   Mr. Obeirne has 4 daughters (ages 52, 46, 74, and 17) and one son (age 51) without cancers. He is one of 12 children. He has 6 sisters and 5 brothers. His sister, Posey Boyer, died at age 87 from breast cancer. Another sister, Hilda Blades, currently has an unspecified form of cancer. One of Debra's daughters  died at age 39 with possible cervical cancer. Mr. Redwine's brother, Imir, died at 7 with leukemia that was diagnosed in his late-teens. Another brother, Jeneen Rinks, has a history of prostate cancer that recently recurred at age 21. A third brother, Marjory Lies, had a spinal tumor at age 51 that was resected, but left him wheelchair-bound. Mr. Gremillion reports that 2 of his sisters Butch Penny and [place]) who have no personal history of cancers have undergone bilateral mastectomies in response to positive genetic testing results. He reports that 2 more sisters (Ashia and Prescott Parma) have had negative genetic testing.  Mr. Nanna mother was diagnosed with breast cancer and died shortly after at age 79. His mother was one of 12 children as well. One of her sisters, Phineas Semen, died of breast cancer in her 33s. A brother, Joneen Caraway, died of lung cancer. Another brother, Iona Beard, died  of an unspecified form of cancer in his 38s. Neither of Mr. Rocchi's maternal grandparents had cancers. His grandmother died in her 74s and his grandfather died in his late-60s.  Mr. Ryner's father died at age 45 without cancers. His father had 4 sisters and 4 brothers. One sister is living at 46 without cancers. The rest of the siblings have passed away. One brother, Jaquelyn Bitter, had a history of prostate cancer. The rest had no cancers. Mr. Pasion's paternal grandparents both died at later ages without cancers.  There are no known cancers in any of Mr. Maus's cousins. Mr. Laflam's maternal ancestors are of African American descent, and paternal ancestors are of [place] American, Caucasian, and Native American descent. There is no reported Ashkenazi Jewish ancestry. There is no known consanguinity.  GENETIC COUNSELING ASSESSMENT: KELLAN RAFFIELD is a 66 y.o. male with a personal and family history which is somewhat suggestive of a hereditary cancer syndrome and predisposition to cancer. We, therefore, discussed and recommended the following at today's visit.   DISCUSSION: We reviewed the characteristics, features and inheritance patterns of hereditary cancer syndromes. We also discussed genetic testing, including the appropriate family members to test, the process of testing, insurance coverage and turn-around-time for results. We discussed the implications of a negative, positive and/or variant of uncertain significant result. We recommended Mr. Salzwedel pursue genetic testing for the 46-gene Common Hereditary Cancers Panel offered by [facility].   Based on Mr. Kerlin personal and family history of cancer, he meets medical criteria for genetic testing. Despite that he meets criteria, he may still have an out of pocket cost. We discussed that if his out of pocket cost for testing is over $100, the laboratory will call and confirm whether he wants to proceed with testing.  If the out of  pocket cost of testing is less than $100 he will be billed by the genetic testing laboratory.   We reviewed the characteristics, features and inheritance patterns of hereditary cancer syndromes. We also discussed genetic testing, including the appropriate family members to test, the process of testing, insurance coverage and turn-around-time for results. We discussed the implications of a negative, positive and/or variant of uncertain significant result.   Though Mr. Leask qualifies for genetic testing without knowledge of his sisters' genetic testing results, it is important that he obtain a copy of Butch Penny and Shauna's results so that we may provide our fullest risk-assessment for Mr. Dredge and his family. Mr. Blanchette was encouraged to obtain a copy of his sisters' genetic testing results for inclusion in his own records.  PLAN: After considering the risks, benefits, and limitations, Mr. Arther  provided informed consent to pursue genetic testing and the blood sample was sent to [facility] for analysis of the 46-gene Common Hereditary Cancers Panel. Results should be available within approximately 3 weeks' time, at which point they will be disclosed by telephone to Mr. Kottke, as will any additional recommendations warranted by these results. Mr. Bellina will receive a summary of his genetic counseling visit and a copy of his results once available. This information will also be available in Epic. We encouraged Mr. Jeziorski to remain in contact with cancer genetics annually so that we can continuously update the family history and inform him of any changes in cancer genetics and testing that may be of benefit for his family. Mr. Kersten's questions were answered to his satisfaction today. Our contact information was provided should additional questions or concerns arise.  Lastly, we encouraged Mr. Klemens to remain in contact with cancer genetics annually so that we can continuously  update the family history and inform him of any changes in cancer genetics and testing that may be of benefit for this family.   Mr.  Burgett's questions were answered to his satisfaction today. Our contact information was provided should additional questions or concerns arise. Thank you for the referral and allowing [place] to share in the care of your patient.   Mal Misty, MS, [facility] Certified Naval [email or web address] phone: [phone]  The patient was seen for a total of 40 minutes in face-to-face genetic counseling.  This patient was discussed with Drs. Magrinat, Lindi Adie and/or Burr Medico who agrees with the above.    _______________________________________________________________________ For Office Staff:  Number of people involved in session: 2 Was an Intern/ student involved with case: no

## 2016-05-21 ENCOUNTER — Ambulatory Visit (INDEPENDENT_AMBULATORY_CARE_PROVIDER_SITE_OTHER): Payer: Medicare Other

## 2016-05-21 ENCOUNTER — Encounter: Payer: Self-pay | Admitting: Nurse Practitioner

## 2016-05-21 ENCOUNTER — Ambulatory Visit (INDEPENDENT_AMBULATORY_CARE_PROVIDER_SITE_OTHER): Payer: Medicare Other | Admitting: Nurse Practitioner

## 2016-05-21 VITALS — BP 144/80 | HR 74 | Temp 97.8°F | Ht 69.0 in | Wt 330.0 lb

## 2016-05-21 VITALS — BP 144/80 | HR 74 | Temp 97.8°F | Resp 18 | Ht 69.0 in | Wt 336.0 lb

## 2016-05-21 DIAGNOSIS — E1122 Type 2 diabetes mellitus with diabetic chronic kidney disease: Secondary | ICD-10-CM | POA: Diagnosis not present

## 2016-05-21 DIAGNOSIS — Z23 Encounter for immunization: Secondary | ICD-10-CM | POA: Diagnosis not present

## 2016-05-21 DIAGNOSIS — D6859 Other primary thrombophilia: Secondary | ICD-10-CM | POA: Diagnosis not present

## 2016-05-21 DIAGNOSIS — Z0000 Encounter for general adult medical examination without abnormal findings: Secondary | ICD-10-CM

## 2016-05-21 DIAGNOSIS — I2699 Other pulmonary embolism without acute cor pulmonale: Secondary | ICD-10-CM | POA: Diagnosis not present

## 2016-05-21 DIAGNOSIS — I10 Essential (primary) hypertension: Secondary | ICD-10-CM | POA: Diagnosis not present

## 2016-05-21 DIAGNOSIS — E785 Hyperlipidemia, unspecified: Secondary | ICD-10-CM

## 2016-05-21 DIAGNOSIS — M170 Bilateral primary osteoarthritis of knee: Secondary | ICD-10-CM

## 2016-05-21 MED ORDER — AMLODIPINE BESYLATE 10 MG PO TABS: 10.0000 mg | ORAL_TABLET | Freq: Every day | ORAL | 2 refills | Status: DC

## 2016-05-21 NOTE — Progress Notes (Signed)
Careteam: Patient Care Team: Thomas Chandler, NP as PCP - General (Nurse Practitioner)  Advanced Directive information Does Patient Have a Medical Advance Directive?: No, Does patient want to make changes to medical advance directive?: Yes (MAU/Ambulatory/Procedural Areas - Information given) (Pt has paperwork but it has not been completed yet. )t No Known Allergies  Chief Complaint  Patient presents with  . Medical Management of Chronic Issues    Pt is being seen for a 3 month routine visit. Pt is having pain in right knee.     HPI: Patient is a 66 y.o. male seen in the office today for routine follow up on chronic conditions.  Following with hematologist due to bilateral PE and DVT. conts on xarelto. Found to be positive for lupus anticoagulant hematologist questions significant. Was sent to genetic counselor due to family and personal history of cancer. Awaiting results.  Recommended to cont xarelto.  In January he was seen in the ED and dignosed with pneumonia Was having trouble getting diabetic medication but has worked this out with insurance at this time.  When he was having trouble with his medications started on "soup" diet but only lasted a month. Now he has put his weight back now.  Needs to get back into exercise. Doing more around the house.  Banged his knee on the table- 3 weeks ago. A little swollen but has been doing better. Unsure if he could take tylenol.  Review of Systems:  Review of Systems  Constitutional: Negative for appetite change, chills, fatigue, fever and unexpected weight change.  HENT: Negative for congestion.   Eyes: Negative for visual disturbance.  Respiratory: Positive for shortness of breath (with exertion). Negative for cough and wheezing.   Cardiovascular: Negative for chest pain, palpitations and leg swelling.  Gastrointestinal: Negative for abdominal pain, blood in stool, constipation and diarrhea.  Genitourinary:       Self-caths    Musculoskeletal: Positive for arthralgias and joint swelling. Negative for gait problem.  Neurological: Negative for dizziness and light-headedness.  Hematological: Does not bruise/bleed easily.        Past Medical History:  Diagnosis Date  . Arthritis   . Cancer [facility]) 2010   Prostate  . Chronic kidney disease   . Diabetes mellitus   . DVT (deep vein thrombosis) in pregnancy ([place])   . Hypertension   . PE (pulmonary thromboembolism) ([place])    Past Surgical History:  Procedure Laterality Date  . HERNIA REPAIR    . KNEE SURGERY    . PROSTATE SURGERY    . RADIOACTIVE SEED IMPLANT    . SHOULDER SURGERY    . uretha surgery-2014     Social History:   reports that he has never smoked. He has never used smokeless tobacco. He reports that he does not drink alcohol or use drugs.  Family History  Problem Relation Age of Onset  . Breast cancer Mother 72    d.89  . Breast cancer Sister 9    d.30  . Leukemia Brother 18    d.20  . Breast cancer Maternal Aunt 40    d.40s  . Lung cancer Maternal Uncle   . Prostate cancer Paternal Uncle   . Prostate cancer Brother     recurred recently at age 72  . Other Brother 15    spinal tumor  . Cervical cancer Other 22    d.22  . Cancer Sister 82    unspecified type  . Cancer Maternal Uncle 49  unspecified type    Medications: Patient's Medications  New Prescriptions   No medications on file  Previous Medications   AMLODIPINE (NORVASC) 10 MG TABLET    Take 1 tablet (10 mg total) by mouth daily. For high blood pressure   ATORVASTATIN (LIPITOR) 10 MG TABLET    Take one tablet by mouth once daily for cholesterol   BENZONATATE (TESSALON) 100 MG CAPSULE    Take 1 capsule (100 mg total) by mouth every 8 (eight) hours.   CARVEDILOL (COREG) 6.25 MG TABLET    Take 1 tablet (6.25 mg total) by mouth 2 (two) times daily with a meal. For high blood pressure   EMPAGLIFLOZIN (JARDIANCE) 10 MG TABS TABLET    Take 10 mg by mouth daily.   GLIPIZIDE  (GLUCOTROL) 5 MG TABLET    Take 1 tablet (5 mg total) by mouth daily before breakfast.   HYDRALAZINE (APRESOLINE) 25 MG TABLET    Take 1 tablet (25 mg total) by mouth 3 (three) times daily.   LISINOPRIL (PRINIVIL,ZESTRIL) 40 MG TABLET    Take 1 tablet (40 mg total) by mouth daily. For high blood pressure   METFORMIN (GLUCOPHAGE) 500 MG TABLET    TAKE 1 TABLET BY MOUTH 2 TIMES A DAY WITH A MEAL for diabetes   OMEPRAZOLE (PRILOSEC) 20 MG CAPSULE    Take 1 capsule (20 mg total) by mouth as needed.   POTASSIUM CHLORIDE SA (K-DUR,KLOR-CON) 20 MEQ TABLET    Take 20 mEq by mouth 2 (two) times daily.   RIVAROXABAN (XARELTO) 20 MG TABS TABLET    Take 1 tablet (20 mg total) by mouth daily with supper.   TRAMADOL (ULTRAM) 50 MG TABLET    Take 1 tablet (50 mg total) by mouth every 8 (eight) hours as needed. for pain   TRUE METRIX BLOOD GLUCOSE TEST TEST STRIP    CHECK BLOOD SUGAR TWICE PER DAY   TRUEPLUS LANCETS 30G MISC    USE TO TEST BLOOD SUGAR 2 TIMES A DAY  Modified Medications   No medications on file  Discontinued Medications   AZITHROMYCIN (ZITHROMAX) 250 MG TABLET    Take 1 tablet (250 mg total) by mouth daily. Take first 2 tablets together, then 1 every day until finished.     Physical Exam:  Vitals:   05/21/16 0959  BP: (!) 144/80  Pulse: 74  Resp: 18  Temp: 97.8 F (36.6 C)  SpO2: 93%  Weight: (!) 336 lb (152.4 kg)  Height: 5\' 9"  (1.753 m)   Body mass index is 49.62 kg/m.  Physical Exam  Constitutional: He is oriented to person, place, and time. He appears well-developed and well-nourished.  HENT:  Head: Normocephalic and atraumatic.  Mouth/Throat: No oropharyngeal exudate.  Eyes: Pupils are equal, round, and reactive to light.  Neck: Normal range of motion. Neck supple. Carotid bruit is not present.  Cardiovascular: Normal rate, regular rhythm and normal heart sounds.   Pulmonary/Chest: Effort normal and breath sounds normal. No respiratory distress. He has no wheezes.    Abdominal: Soft. Bowel sounds are normal. He exhibits no abdominal bruit and no pulsatile midline mass.  Musculoskeletal: Normal range of motion. He exhibits no edema or tenderness.  Neurological: He is alert and oriented to person, place, and time. No sensory deficit.  Skin: Skin is warm and dry.  Psychiatric: He has a normal mood and affect. His behavior is normal. Judgment and thought content normal.    Labs reviewed: Basic Metabolic Panel:  Recent  Labs  11/25/15 0153  12/16/15 0944 03/03/16 0927 03/07/16 1503 04/07/16 1130  NA  --   < > 141 141 138 141  K  --   < > 3.7 3.3 3.1* 4.1  CL  --   < > 107 103 100*  --   CO2  --   < > 24 28 30 22   GLUCOSE  --   < > 223* 272* 211* 173*  BUN  --   < > 15 12 15  12.2  CREATININE  --   < > 0.93 0.9 1.21 1.6*  CALCIUM  --   < > 8.9 8.8 8.7* 9.3  MG 2.0  --   --   --   --   --   < > = values in this interval not displayed. Liver Function Tests:  Recent Labs  11/01/15 0936 03/03/16 0927 04/07/16 1130  AST 13 15 14   ALT 10 17 21   ALKPHOS 70 85* 73  BILITOT 0.5 0.90 0.71  PROT 7.2 7.4 7.9  ALBUMIN 3.9 3.6 3.7   No results for input(s): LIPASE, AMYLASE in the last 8760 hours. No results for input(s): AMMONIA in the last 8760 hours. CBC:  Recent Labs  03/03/16 0927 03/07/16 1503 04/07/16 1130  WBC 9.7 7.2 8.7  NEUTROABS 7.8* 4.8 6.2  HGB 13.2 12.9* 14.5  HCT 41.5 41.0 44.4  MCV 84 83.0 82  PLT 241 273 310   Lipid Panel: No results for input(s): CHOL, HDL, LDLCALC, TRIG, CHOLHDL, LDLDIRECT in the last 8760 hours. TSH: No results for input(s): TSH in the last 8760 hours. A1C: Lab Results  Component Value Date   HGBA1C 6.8 (H) 02/20/2016     Assessment/Plan 1. Essential hypertension -blood pressure stable, encouraged lifestyle modifications and to cont current medication regimen.  - amLODipine (NORVASC) 10 MG tablet; Take 1 tablet (10 mg total) by mouth daily. For high blood pressure  Dispense: 90 tablet;  Refill: 2  2. Type 2 diabetes mellitus with chronic kidney disease, without long-term current use of insulin, unspecified CKD stage (HCC) -A1c at goal on last labs, will follow up A1c with next blood work, to cont current medications.  3. Other pulmonary embolism without acute cor pulmonale, unspecified chronicity (HCC) Stable, conts on xarelto. Without shortness of breath.   4. MORBID OBESITY Encouraged lifestyle modifications that are obtainable and sustainable vs "soup" diet.  To increase exercise.   5. Hypercoagulable state ([place]) conts to follow up with hematology, conts on xarelto  6. Primary osteoarthritis of both knees Stable, increased pain to right knee after he bumped it but this has resolved now  7. Hyperlipidemia with target LDL less than 100 Dietary modifications encouraged, cont on lipitor 10 mg daily  Will get lipid panel at next lab appt as he is not fasting today.   8. Need for pneumococcal vaccine - Pneumococcal polysaccharide vaccine 23-valent greater than or equal to 2yo subcutaneous/IM    Jessica K. Harle Battiest  [facility] [phone] 8 am - 5 pm) [phone] (after hours)

## 2016-05-21 NOTE — Progress Notes (Signed)
Quick Notes   Health Maintenance: HIV screen due. Shingles Vaccine education given.     Abnormal Screen: MMSE 28/30. Passed clock drawing. 1st BP: 150/84. 2nd BP: 144/80     Patient Concerns: none     Nurse Concerns: none

## 2016-05-21 NOTE — Patient Instructions (Signed)
Remember to stay hydration and to avoid NSAIDS (Aleve, Advil, Motrin, Ibuprofen)

## 2016-05-21 NOTE — Progress Notes (Signed)
Subjective:   Thomas [name] is a 66 y.o. male who presents for an Initial Medicare Annual Wellness Visit.    Objective:    Today's Vitals   05/21/16 0918  BP: (!) 150/84  Pulse: 74  Temp: 97.8 F (36.6 C)  TempSrc: Oral  SpO2: 93%  Weight: (!) 330 lb (149.7 kg)  Height: 5\' 9"  (1.753 m)   Body mass index is 48.73 kg/m.  Current Medications (verified) Outpatient Encounter Prescriptions as of 05/21/2016  Medication Sig  . atorvastatin (LIPITOR) 10 MG tablet Take one tablet by mouth once daily for cholesterol  . azithromycin (ZITHROMAX) 250 MG tablet Take 1 tablet (250 mg total) by mouth daily. Take first 2 tablets together, then 1 every day until finished.  . carvedilol (COREG) 6.25 MG tablet Take 1 tablet (6.25 mg total) by mouth 2 (two) times daily with a meal. For high blood pressure  . empagliflozin (JARDIANCE) 10 MG TABS tablet Take 10 mg by mouth daily.  Marland Kitchen glipiZIDE (GLUCOTROL) 5 MG tablet Take 1 tablet (5 mg total) by mouth daily before breakfast.  . hydrALAZINE (APRESOLINE) 25 MG tablet Take 1 tablet (25 mg total) by mouth 3 (three) times daily.  Marland Kitchen lisinopril (PRINIVIL,ZESTRIL) 40 MG tablet Take 1 tablet (40 mg total) by mouth daily. For high blood pressure  . metFORMIN (GLUCOPHAGE) 500 MG tablet TAKE 1 TABLET BY MOUTH 2 TIMES A DAY WITH A MEAL for diabetes  . omeprazole (PRILOSEC) 20 MG capsule Take 1 capsule (20 mg total) by mouth as needed.  . rivaroxaban (XARELTO) 20 MG TABS tablet Take 1 tablet (20 mg total) by mouth daily with supper.  . traMADol (ULTRAM) 50 MG tablet Take 1 tablet (50 mg total) by mouth every 8 (eight) hours as needed. for pain  . TRUE METRIX BLOOD GLUCOSE TEST test strip CHECK BLOOD SUGAR TWICE PER DAY  . TRUEPLUS LANCETS 30G MISC USE TO TEST BLOOD SUGAR 2 TIMES A DAY  . amLODipine (NORVASC) 10 MG tablet Take 1 tablet (10 mg total) by mouth daily. For high blood pressure (Patient not taking: Reported on 05/21/2016)  . benzonatate (TESSALON)  100 MG capsule Take 1 capsule (100 mg total) by mouth every 8 (eight) hours. (Patient not taking: Reported on 05/21/2016)  . potassium chloride SA (K-DUR,KLOR-CON) 20 MEQ tablet Take 20 mEq by mouth 2 (two) times daily.  . [DISCONTINUED] omeprazole (PRILOSEC) 20 MG capsule TAKE 1 CAPSULE BY MOUTH DAILY.  . [DISCONTINUED] potassium chloride SA (KLOR-CON M20) 20 MEQ tablet Take 1 tablet (20 mEq total) by mouth daily.   No facility-administered encounter medications on file as of 05/21/2016.     Allergies (verified) Patient has no known allergies.   History: Past Medical History:  Diagnosis Date  . Arthritis   . Cancer [facility]) 2010   Prostate  . Chronic kidney disease   . Diabetes mellitus   . DVT (deep vein thrombosis) in pregnancy ([place])   . Hypertension   . PE (pulmonary thromboembolism) ([place])    Past Surgical History:  Procedure Laterality Date  . HERNIA REPAIR    . KNEE SURGERY    . PROSTATE SURGERY    . RADIOACTIVE SEED IMPLANT    . SHOULDER SURGERY    . uretha surgery-2014     Family History  Problem Relation Age of Onset  . Breast cancer Mother 50    d.89  . Breast cancer Sister 77    d.30  . Leukemia Brother 70  d.20  . Breast cancer Maternal Aunt 40    d.40s  . Lung cancer Maternal Uncle   . Prostate cancer Paternal Uncle   . Prostate cancer Brother     recurred recently at age 23  . Other Brother 15    spinal tumor  . Cervical cancer Other 22    d.22  . Cancer Sister 43    unspecified type  . Cancer Maternal Uncle 80    unspecified type   Social History   Occupational History  . Not on file.   Social History Main Topics  . Smoking status: Never Smoker  . Smokeless tobacco: Never Used  . Alcohol use No     Comment: never  . Drug use: No  . Sexual activity: Yes   Tobacco Counseling Counseling given: Not Answered   Activities of Daily Living In your present state of health, do you have any difficulty performing the following activities:  05/21/2016 11/23/2015  Hearing? N N  Vision? N N  Difficulty concentrating or making decisions? N N  Walking or climbing stairs? Y N  Dressing or bathing? N N  Doing errands, shopping? N N  Preparing Food and eating ? N -  Using the Toilet? N -  In the past six months, have you accidently leaked urine? Y -  Do you have problems with loss of bowel control? N -  Managing your Medications? N -  Managing your Finances? N -  Housekeeping or managing your Housekeeping? N -  Some recent data might be hidden    Immunizations and Health Maintenance Immunization History  Administered Date(s) Administered  . Influenza,inj,Quad PF,36+ Mos 11/22/2013, 12/05/2014, 10/24/2015  . Pneumococcal Conjugate-13 05/23/2014  . Pneumococcal Polysaccharide-23 12/13/2012   Health Maintenance Due  Topic Date Due  . HIV Screening  06/26/1965    Patient Care Team: [name], NP as PCP - General (Nurse Practitioner)  Indicate any recent Medical Services you may have received from other than [facility] providers in the past year (date may be approximate).    Assessment:   This is a routine wellness examination for Lev.   Hearing/Vision screen No exam data present  Dietary issues and exercise activities discussed: Current Exercise Habits: The patient does not participate in regular exercise at present  Goals    . Exercise 3x per week (30 min per time)          Starting 05/21/16 I will start going to [facility] and workout 3 days a week.      Depression Screen PHQ 2/9 Scores 05/21/2016 09/30/2015 07/27/2014 10/25/2012  PHQ - 2 Score 1 0 0 0  PHQ- 9 Score - - - 0    Fall Risk Fall Risk  05/21/2016 04/07/2016 02/20/2016 01/23/2016 01/16/2016  Falls in the past year? No No No No No  Number falls in past yr: - - - - -  Injury with Fall? - - - - -    Cognitive Function: MMSE - Mini Mental State Exam 05/21/2016  Orientation to time 5  Orientation to Place 5  Registration 3  Attention/ Calculation 4    Recall 2  Language- name 2 objects 2  Language- repeat 1  Language- follow 3 step command 3  Language- read & follow direction 1  Write a sentence 1  Copy design 1  Total score 28        Screening Tests Health Maintenance  Topic Date Due  . HIV Screening  06/26/1965  . TETANUS/TDAP  07/31/2016  . HEMOGLOBIN A1C  08/19/2016  . INFLUENZA VACCINE  09/16/2016  . FOOT EXAM  09/29/2016  . OPHTHALMOLOGY EXAM  01/01/2017  . PNA vac Low Risk Adult (2 of 2 - PPSV23) 12/13/2017  . COLONOSCOPY  02/16/2018  . Hepatitis C Screening  Completed        Plan:    I have personally reviewed and addressed the Medicare Annual Wellness questionnaire and have noted the following in the patient's chart:  A. Medical and social history B. Use of alcohol, tobacco or illicit drugs  C. Current medications and supplements D. Functional ability and status E.  Nutritional status F.  Physical activity G. Advance directives H. List of other physicians I.  Hospitalizations, surgeries, and ER visits in previous 12 months J.  [place] to include hearing, vision, cognitive, depression L. Referrals and appointments - none  In addition, I have reviewed and discussed with patient certain preventive protocols, quality metrics, and best practice recommendations. A written personalized care plan for preventive services as well as general preventive health recommendations were provided to patient.  See attached scanned questionnaire for additional information.   Signed,   Rich Reining, RN Nurse Health Advisor  I reviewed health advisors note, was available for consultation and agree with documentation and plan.  Carlos American. Harle Battiest  [facility] [phone] 8 am - 5 pm) [phone] (after hours)

## 2016-05-21 NOTE — Patient Instructions (Signed)
Thomas [name] , Thank you for taking time to come for your Medicare Wellness Visit. I appreciate your ongoing commitment to your health goals. Please review the following plan we discussed and let me know if I can assist you in the future.   Screening recommendations/referrals: Colonoscopy up to date.  Recommended yearly ophthalmology/optometry visit for glaucoma screening and checkup Recommended yearly dental visit for hygiene and checkup  Vaccinations: Influenza vaccine up to date. Pneumococcal vaccine up to date. Tdap vaccine due 07/2016. Shingles vaccine due. If you want it give [place] a call and we will get you a prescription.   Advanced directives: Advance directive discussed with you today. Bring in a copy in to our office so we can scan it into your chart.   Conditions/risks identified: None  Next appointment: [name] 4/5 @ 10:15am  Preventive Care 65 Years and Older, Male Preventive care refers to lifestyle choices and visits with your health care provider that can promote health and wellness. What does preventive care include?  A yearly physical exam. This is also called an annual well check.  Dental exams once or twice a year.  Routine eye exams. Ask your health care provider how often you should have your eyes checked.  Personal lifestyle choices, including:  Daily care of your teeth and gums.  Regular physical activity.  Eating a healthy diet.  Avoiding tobacco and drug use.  Limiting alcohol use.  Practicing safe sex.  Taking low doses of aspirin every day.  Taking vitamin and mineral supplements as recommended by your health care provider. What happens during an annual well check? The services and screenings done by your health care provider during your annual well check will depend on your age, overall health, lifestyle risk factors, and family history of disease. Counseling  Your health care provider may ask you questions about your:  Alcohol  use.  Tobacco use.  Drug use.  Emotional well-being.  Home and relationship well-being.  Sexual activity.  Eating habits.  History of falls.  Memory and ability to understand (cognition).  Work and work Statistician. Screening  You may have the following tests or measurements:  Height, weight, and BMI.  Blood pressure.  Lipid and cholesterol levels. These may be checked every 5 years, or more frequently if you are over 102 years old.  Skin check.  Lung cancer screening. You may have this screening every year starting at age 80 if you have a 30-pack-year history of smoking and currently smoke or have quit within the past 15 years.  Fecal occult blood test (FOBT) of the stool. You may have this test every year starting at age 46.  Flexible sigmoidoscopy or colonoscopy. You may have a sigmoidoscopy every 5 years or a colonoscopy every 10 years starting at age 22.  Prostate cancer screening. Recommendations will vary depending on your family history and other risks.  Hepatitis C blood test.  Hepatitis B blood test.  Sexually transmitted disease (STD) testing.  Diabetes screening. This is done by checking your blood sugar (glucose) after you have not eaten for a while (fasting). You may have this done every 1-3 years.  Abdominal aortic aneurysm (AAA) screening. You may need this if you are a current or former smoker.  Osteoporosis. You may be screened starting at age 71 if you are at high risk. Talk with your health care provider about your test results, treatment options, and if necessary, the need for more tests. Vaccines  Your health care provider may recommend certain  vaccines, such as:  Influenza vaccine. This is recommended every year.  Tetanus, diphtheria, and acellular pertussis (Tdap, Td) vaccine. You may need a Td booster every 10 years.  Zoster vaccine. You may need this after age 3.  Pneumococcal 13-valent conjugate (PCV13) vaccine. One dose is  recommended after age 55.  Pneumococcal polysaccharide (PPSV23) vaccine. One dose is recommended after age 23. Talk to your health care provider about which screenings and vaccines you need and how often you need them. This information is not intended to replace advice given to you by your health care provider. Make sure you discuss any questions you have with your health care provider. Document Released: 03/01/2015 Document Revised: 10/23/2015 Document Reviewed: 12/04/2014 Elsevier Interactive Patient Education  2017 [employer] Prevention in the Home Falls can cause injuries. They can happen to people of all ages. There are many things you can do to make your home safe and to help prevent falls. What can I do on the outside of my home?  Regularly fix the edges of walkways and driveways and fix any cracks.  Remove anything that might make you trip as you walk through a door, such as a raised step or threshold.  Trim any bushes or trees on the path to your home.  Use bright outdoor lighting.  Clear any walking paths of anything that might make someone trip, such as rocks or tools.  Regularly check to see if handrails are loose or broken. Make sure that both sides of any steps have handrails.  Any raised decks and porches should have guardrails on the edges.  Have any leaves, snow, or ice cleared regularly.  Use sand or salt on walking paths during winter.  Clean up any spills in your garage right away. This includes oil or grease spills. What can I do in the bathroom?  Use night lights.  Install grab bars by the toilet and in the tub and shower. Do not use towel bars as grab bars.  Use non-skid mats or decals in the tub or shower.  If you need to sit down in the shower, use a plastic, non-slip stool.  Keep the floor dry. Clean up any water that spills on the floor as soon as it happens.  Remove soap buildup in the tub or shower regularly.  Attach bath mats  securely with double-sided non-slip rug tape.  Do not have throw rugs and other things on the floor that can make you trip. What can I do in the bedroom?  Use night lights.  Make sure that you have a light by your bed that is easy to reach.  Do not use any sheets or blankets that are too big for your bed. They should not hang down onto the floor.  Have a firm chair that has side arms. You can use this for support while you get dressed.  Do not have throw rugs and other things on the floor that can make you trip. What can I do in the kitchen?  Clean up any spills right away.  Avoid walking on wet floors.  Keep items that you use a lot in easy-to-reach places.  If you need to reach something above you, use a strong step stool that has a grab bar.  Keep electrical cords out of the way.  Do not use floor polish or wax that makes floors slippery. If you must use wax, use non-skid floor wax.  Do not have throw rugs and other things  on the floor that can make you trip. What can I do with my stairs?  Do not leave any items on the stairs.  Make sure that there are handrails on both sides of the stairs and use them. Fix handrails that are broken or loose. Make sure that handrails are as long as the stairways.  Check any carpeting to make sure that it is firmly attached to the stairs. Fix any carpet that is loose or worn.  Avoid having throw rugs at the top or bottom of the stairs. If you do have throw rugs, attach them to the floor with carpet tape.  Make sure that you have a light switch at the top of the stairs and the bottom of the stairs. If you do not have them, ask someone to add them for you. What else can I do to help prevent falls?  Wear shoes that:  Do not have high heels.  Have rubber bottoms.  Are comfortable and fit you well.  Are closed at the toe. Do not wear sandals.  If you use a stepladder:  Make sure that it is fully opened. Do not climb a closed  stepladder.  Make sure that both sides of the stepladder are locked into place.  Ask someone to hold it for you, if possible.  Clearly mark and make sure that you can see:  Any grab bars or handrails.  First and last steps.  Where the edge of each step is.  Use tools that help you move around (mobility aids) if they are needed. These include:  Canes.  Walkers.  Scooters.  Crutches.  Turn on the lights when you go into a dark area. Replace any light bulbs as soon as they burn out.  Set up your furniture so you have a clear path. Avoid moving your furniture around.  If any of your floors are uneven, fix them.  If there are any pets around you, be aware of where they are.  Review your medicines with your doctor. Some medicines can make you feel dizzy. This can increase your chance of falling. Ask your doctor what other things that you can do to help prevent falls. This information is not intended to replace advice given to you by your health care provider. Make sure you discuss any questions you have with your health care provider. Document Released: 11/29/2008 Document Revised: 07/11/2015 Document Reviewed: 03/09/2014 Elsevier Interactive Patient Education  2017 [employer].

## 2016-06-01 ENCOUNTER — Telehealth: Payer: Self-pay | Admitting: Genetics

## 2016-06-01 NOTE — Telephone Encounter (Deleted)
-----   Message from Mal Misty sent at 05/14/2016 12:25 PM EDT ----- Regarding: Call Results Ask for Shauna and Donna's genetic testing results.

## 2016-06-03 ENCOUNTER — Telehealth: Payer: Self-pay | Admitting: *Deleted

## 2016-06-03 MED ORDER — CIPROFLOXACIN HCL 500 MG PO TABS: ORAL_TABLET | ORAL | 0 refills | Status: DC

## 2016-06-03 NOTE — Telephone Encounter (Signed)
Okay to call in Cipro 500 mg by mouth twice daily for 7 days #14/0 refills. Needs to follow up in office if symptoms persist

## 2016-06-03 NOTE — Telephone Encounter (Signed)
Patient called and left message on Clinical Intake line and stated that he is in [place] and has a UTI and wants to know if something can be called into pharmacy.   I called patient back and LMOM for him to [facility] with symptoms he is having and what pharmacy so we could send to [place] to advise. Awaiting call.

## 2016-06-03 NOTE — Telephone Encounter (Signed)
Rx faxed to pharmacy. Patient notified. 

## 2016-06-03 NOTE — Telephone Encounter (Signed)
Patient called back and stated that he is having burning with Urination, Frequency. No fever. Patient in [place] and needs sent to [facility] in [place]. Please Advise.

## 2016-06-09 MED FILL — AMLODIPINE BESYLATE 10 MG T: 10 | 90 days supply | Qty: 90 | Fill #0

## 2016-06-13 ENCOUNTER — Emergency Department (HOSPITAL_BASED_OUTPATIENT_CLINIC_OR_DEPARTMENT_OTHER): Payer: Medicare Other

## 2016-06-13 ENCOUNTER — Encounter (HOSPITAL_BASED_OUTPATIENT_CLINIC_OR_DEPARTMENT_OTHER): Payer: Self-pay | Admitting: Emergency Medicine

## 2016-06-13 ENCOUNTER — Emergency Department (HOSPITAL_BASED_OUTPATIENT_CLINIC_OR_DEPARTMENT_OTHER)
Admission: EM | Admit: 2016-06-13 | Discharge: 2016-06-13 | Disposition: A | Discharge status: Home / Self Care | Payer: Medicare Other | Attending: Emergency Medicine | Admitting: Emergency Medicine

## 2016-06-13 DIAGNOSIS — Z8546 Personal history of malignant neoplasm of prostate: Secondary | ICD-10-CM | POA: Diagnosis not present

## 2016-06-13 DIAGNOSIS — R0602 Shortness of breath: Secondary | ICD-10-CM | POA: Insufficient documentation

## 2016-06-13 DIAGNOSIS — R0600 Dyspnea, unspecified: Secondary | ICD-10-CM

## 2016-06-13 DIAGNOSIS — M79662 Pain in left lower leg: Secondary | ICD-10-CM | POA: Insufficient documentation

## 2016-06-13 DIAGNOSIS — N189 Chronic kidney disease, unspecified: Secondary | ICD-10-CM | POA: Diagnosis not present

## 2016-06-13 DIAGNOSIS — Z7984 Long term (current) use of oral hypoglycemic drugs: Secondary | ICD-10-CM | POA: Diagnosis not present

## 2016-06-13 DIAGNOSIS — R0609 Other forms of dyspnea: Secondary | ICD-10-CM

## 2016-06-13 DIAGNOSIS — E1122 Type 2 diabetes mellitus with diabetic chronic kidney disease: Secondary | ICD-10-CM | POA: Diagnosis not present

## 2016-06-13 DIAGNOSIS — Z79899 Other long term (current) drug therapy: Secondary | ICD-10-CM | POA: Insufficient documentation

## 2016-06-13 DIAGNOSIS — I129 Hypertensive chronic kidney disease with stage 1 through stage 4 chronic kidney disease, or unspecified chronic kidney disease: Secondary | ICD-10-CM | POA: Diagnosis not present

## 2016-06-13 DIAGNOSIS — I82432 Acute embolism and thrombosis of left popliteal vein: Secondary | ICD-10-CM | POA: Diagnosis not present

## 2016-06-13 LAB — CBC
HCT: 41.7 % (ref 39.0–52.0)
Hemoglobin: 13.5 g/dL (ref 13.0–17.0)
MCH: 27.2 pg (ref 26.0–34.0)
MCHC: 32.4 g/dL (ref 30.0–36.0)
MCV: 84.1 fL (ref 78.0–100.0)
Platelets: 277 10*3/uL (ref 150–400)
RBC: 4.96 MIL/uL (ref 4.22–5.81)
RDW: 15.2 % (ref 11.5–15.5)
WBC: 6.6 10*3/uL (ref 4.0–10.5)

## 2016-06-13 LAB — BASIC METABOLIC PANEL
Anion gap: 9 (ref 5–15)
BUN: 18 mg/dL (ref 6–20)
CO2: 28 mmol/L (ref 22–32)
Calcium: 8.3 mg/dL — ABNORMAL LOW (ref 8.9–10.3)
Chloride: 104 mmol/L (ref 101–111)
Creatinine, Ser: 1.09 mg/dL (ref 0.61–1.24)
GFR calc Af Amer: >60 mL/min (ref >60)
GFR calc non Af Amer: >60 mL/min (ref >60)
Glucose, Bld: 98 mg/dL (ref 65–99)
Potassium: 3.1 mmol/L — ABNORMAL LOW (ref 3.5–5.1)
Sodium: 141 mmol/L (ref 135–145)

## 2016-06-13 LAB — TROPONIN I
Troponin I: 0.03 ng/mL (ref <0.03)
Troponin I: 0.03 ng/mL (ref <0.03)

## 2016-06-13 LAB — BRAIN NATRIURETIC PEPTIDE: B Natriuretic Peptide: 156.8 pg/mL — ABNORMAL HIGH (ref 0.0–100.0)

## 2016-06-13 MED ORDER — IOPAMIDOL (ISOVUE-370) INJECTION 76%
100.0000 mL | Freq: Once | INTRAVENOUS | Status: AC | PRN
  Administered 2016-06-13: 100 mL via INTRAVENOUS

## 2016-06-13 NOTE — ED Provider Notes (Signed)
Thomas [name] Provider Note   CSN: [id number] Arrival date & time: 06/13/16  1042     History   Chief Complaint Chief Complaint  Patient presents with  . Shortness of Breath  . Leg Pain    HPI Thomas [name] is a 66 y.o. male.  HPI Patient presents to the emergency room for evaluation of left calf pain and shortness of breath with exertion. Patient has a history of DVT and pulmonary embolism. Patient states he continues to take Xarelto for that. Patient states after the initial diagnosis he also was given supplemental oxygen but he does not need to use that at this point. He drove to [place] one week ago. In the last few days he has noticed some increasing dyspnea on exertion. [facility] noticing that he is getting more short of breath than expected. He denies any fevers or coughing. He has not noticed any new swelling. He has been compliant with his medications. He denies any chest pain or other complaints Past Medical History:  Diagnosis Date  . Arthritis   . Cancer [facility]) 2010   Prostate  . Chronic kidney disease   . Diabetes mellitus   . DVT (deep vein thrombosis) in pregnancy ([place])   . Hypertension   . PE (pulmonary thromboembolism) [facility])     Patient Active Problem List   Diagnosis Date Noted  . DVT, lower extremity, proximal, acute, left ([place]) 01/23/2016  . Hematuria 12/12/2015  . Pulmonary embolism ([place]) 11/23/2015  . Type 2 diabetes mellitus with chronic kidney disease, without long-term current use of insulin ([place]) 12/05/2014  . Benign prostatic hyperplasia with urinary obstruction 07/25/2014  . Urine retention 07/25/2014  . Urinary tract infection 07/09/2014  . Anemia, iron deficiency 11/22/2013  . Urinary retention 11/01/2013  . Incomplete emptying of bladder 07/26/2013  . Nocturia 07/26/2013  . Other nonspecific finding on examination of urine 07/26/2013  . Sleep apnea 07/26/2013  . Urethral  stricture 07/26/2013  . Malignant neoplasm of prostate ([place]) 07/04/2013  . Bladder neck contracture 07/04/2013  . Severe obesity (BMI >= 40) ([place]) 05/23/2013  . B12 deficiency 05/23/2013  . Hyperlipidemia with target LDL less than 100 09/02/2012  . HTN (hypertension) 05/26/2012  . Osteoarthritis, knee 05/26/2012  . MORBID OBESITY 08/22/2008    Past Surgical History:  Procedure Laterality Date  . HERNIA REPAIR    . KNEE SURGERY    . PROSTATE SURGERY    . RADIOACTIVE SEED IMPLANT    . SHOULDER SURGERY    . uretha surgery-2014         Home Medications    Prior to Admission medications   Medication Sig Start Date End Date Taking? Authorizing Provider  amLODipine (NORVASC) 10 MG tablet Take 1 tablet (10 mg total) by mouth daily. For high blood pressure 05/21/16   [name], NP  atorvastatin (LIPITOR) 10 MG tablet Take one tablet by mouth once daily for cholesterol 09/30/15   [name], NP  benzonatate (TESSALON) 100 MG capsule Take 1 capsule (100 mg total) by mouth every 8 (eight) hours. 03/07/16   Doristine Devoid, PA-C  carvedilol (COREG) 6.25 MG tablet Take 1 tablet (6.25 mg total) by mouth 2 (two) times daily with a meal. For high blood pressure 01/03/16   [name], NP  ciprofloxacin (CIPRO) 500 MG tablet Take one tablet by mouth twice daily for 7 days for infection 06/03/16   Janett Billow  Beaulah Corin, NP  empagliflozin (JARDIANCE) 10 MG TABS tablet Take 10 mg by mouth daily. 05/05/16   [name], NP  glipiZIDE (GLUCOTROL) 5 MG tablet Take 1 tablet (5 mg total) by mouth daily before breakfast. 01/03/16   [name], NP  hydrALAZINE (APRESOLINE) 25 MG tablet Take 1 tablet (25 mg total) by mouth 3 (three) times daily. 02/20/16   [name], NP  lisinopril (PRINIVIL,ZESTRIL) 40 MG tablet Take 1 tablet (40 mg total) by mouth daily. For high blood pressure 09/30/15   [name], NP  metFORMIN (GLUCOPHAGE) 500 MG tablet TAKE 1 TABLET BY MOUTH 2  TIMES A DAY WITH A MEAL for diabetes 01/21/16   Estill Dooms, MD  omeprazole (PRILOSEC) 20 MG capsule Take 1 capsule (20 mg total) by mouth as needed. 05/24/14   [name], NP  potassium chloride SA (K-DUR,KLOR-CON) 20 MEQ tablet Take 20 mEq by mouth 2 (two) times daily.    Historical Provider, MD  rivaroxaban (XARELTO) 20 MG TABS tablet Take 1 tablet (20 mg total) by mouth daily with supper. 01/23/16   Volanda Napoleon, MD  traMADol (ULTRAM) 50 MG tablet Take 1 tablet (50 mg total) by mouth every 8 (eight) hours as needed. for pain 09/30/15   [name], NP  TRUE METRIX BLOOD GLUCOSE TEST test strip CHECK BLOOD SUGAR TWICE PER DAY 10/24/15   [name], NP  TRUEPLUS LANCETS 30G MISC USE TO TEST BLOOD SUGAR 2 TIMES A DAY 09/12/14   [name], NP    Family History Family History  Problem Relation Age of Onset  . Breast cancer Mother 58    d.89  . Breast cancer Sister 4    d.30  . Leukemia Brother 18    d.20  . Breast cancer Maternal Aunt 40    d.40s  . Lung cancer Maternal Uncle   . Prostate cancer Paternal Uncle   . Prostate cancer Brother     recurred recently at age 54  . Other Brother 15    spinal tumor  . Cervical cancer Other 22    d.22  . Cancer Sister 3    unspecified type  . Cancer Maternal Uncle 73    unspecified type    Social History Social History  Substance Use Topics  . Smoking status: Never Smoker  . Smokeless tobacco: Never Used  . Alcohol use No     Comment: never     Allergies   Patient has no known allergies.   Review of Systems Review of Systems  All other systems reviewed and are negative.    Physical Exam Updated Vital Signs BP (!) 180/98   Pulse 70   Temp 97.8 F (36.6 C) (Oral)   Resp 18   Ht 5\' 9"  (1.753 m)   Wt (!) 152.4 kg   SpO2 94%   BMI 49.62 kg/m   Physical Exam  Constitutional: No distress.  Overweight  HENT:  Head: Normocephalic and atraumatic.  Right Ear: External ear normal.  Left Ear:  External ear normal.  Eyes: Conjunctivae are normal. Right eye exhibits no discharge. Left eye exhibits no discharge. No scleral icterus.  Neck: Neck supple. No tracheal deviation present.  Cardiovascular: Normal rate, regular rhythm and intact distal pulses.   Pulmonary/Chest: Effort normal and breath sounds normal. No stridor. No respiratory distress. He has no wheezes. He has no rales.  Abdominal: Soft. Bowel sounds are normal. He exhibits no distension.  There is no tenderness. There is no rebound and no guarding.  Musculoskeletal: He exhibits no edema or tenderness.  Neurological: He is alert. He has normal strength. No cranial nerve deficit (no facial droop, extraocular movements intact, no slurred speech) or sensory deficit. He exhibits normal muscle tone. He displays no seizure activity. Coordination normal.  Skin: Skin is warm and dry. No rash noted.  Psychiatric: He has a normal mood and affect.  Nursing note and vitals reviewed.    ED Treatments / Results  Labs (all labs ordered are listed, but only abnormal results are displayed) Labs Reviewed  TROPONIN I - Abnormal; Notable for the following:       Result Value   Troponin I 0.03 (*)    All other components within normal limits  BRAIN NATRIURETIC PEPTIDE - Abnormal; Notable for the following:    B Natriuretic Peptide 156.8 (*)    All other components within normal limits  BASIC METABOLIC PANEL - Abnormal; Notable for the following:    Potassium 3.1 (*)    Calcium 8.3 (*)    All other components within normal limits  TROPONIN I - Abnormal; Notable for the following:    Troponin I 0.03 (*)    All other components within normal limits  CBC    EKG  EKG Interpretation  Date/Time:  Saturday June 13 2016 10:53:03 EDT Ventricular Rate:  74 PR Interval:    QRS Duration: 219 QT Interval:  451 QTC Calculation: 484 R Axis:   42 Text Interpretation:  Sinus rhythm Multiple premature complexes, vent & supraven , new since  last tracing Probable left ventricular hypertrophy Borderline prolonged QT interval Confirmed by Sitara Cashwell  MD-J, Aqil Goetting ([id number]) on 06/13/2016 11:00:43 AM       Radiology Dg Chest 2 View  Result Date: 06/13/2016 CLINICAL DATA:  Shortness of breath for 4 days.  History of PE. EXAM: CHEST  2 VIEW COMPARISON:  03/07/2016 FINDINGS: Cardiomediastinal silhouette is upper limits of normal. There is persistent prominence of bilateral hilar regions. There is no evidence of focal airspace consolidation, pleural effusion or pneumothorax. Stable pulmonary venous hypertensive changes. Osseous structures are without acute abnormality. Findings compatible with diffuse idiopathic skeletal hyperostosis of the thoracic spine. Soft tissues are grossly normal. IMPRESSION: Stable changes of pulmonary venous hypertension. No evidence of focal airspace consolidation or overt pulmonary edema. Electronically Signed   By: Fidela Salisbury M.D.   On: 06/13/2016 11:30   Ct Angio Chest Pe W And/or Wo Contrast  Result Date: 06/13/2016 CLINICAL DATA:  Shortness of breath. History of prior pulmonary embolus. History of prostate carcinoma EXAM: CT ANGIOGRAPHY CHEST WITH CONTRAST TECHNIQUE: Multidetector CT imaging of the chest was performed using the standard protocol during bolus administration of intravenous contrast. Multiplanar CT image reconstructions and MIPs were obtained to evaluate the vascular anatomy. CONTRAST:  100 mL Isovue 370 nonionic COMPARISON:  Chest CT March 03, 2016; chest radiograph June 13, 2016 FINDINGS: Cardiovascular: There is no demonstrable acute pulmonary embolus on this study. Chronic thickening along the wall of the superior segment right lower lobe pulmonary artery consistent with mild chronic pulmonary embolus change is stable compared to the prior study. No new areas of chronic appearing pulmonary embolus evident. There is no thoracic aortic aneurysm or dissection. Visualized great vessels appear  unremarkable. There are scattered foci of atherosclerotic calcification in the aorta. There is no pericardial thickening. The main pulmonary outflow tract measures 3.8 cm suggesting a degree of pulmonary artery hypertension. Mediastinum/Nodes: Visualized thyroid  appears normal. There is no appreciable thoracic adenopathy. Lungs/Pleura: Mild pleural thickening laterally on each side is chronic and stable. There is patchy atelectasis in the lung bases. There is no lung edema or consolidation. There is no evident pleural effusion. Upper Abdomen: There is a large gallstone within the gallbladder. The gallbladder wall is not appreciably thickened. There is a left adrenal adenoma measuring 2.6 x 1.9 cm. Musculoskeletal: There is degenerative change in the thoracic spine. No blastic or lytic bone lesions. Review of the MIP images confirms the above findings. IMPRESSION: No acute pulmonary embolus evident. Mild chronic pulmonary embolus change noted in the superior segment right lower lobe pulmonary artery, stable. Areas of atherosclerotic calcification. No thoracic aortic aneurysm or dissection. No adenopathy. There is patchy lung atelectasis bilaterally without edema or consolidation. Mild pleural thickening bilaterally is stable and benign in appearance. Cholelithiasis. Left adrenal adenoma. Electronically Signed   By: Lowella Grip III M.D.   On: 06/13/2016 13:25   US Venous Img Lower Unilateral Left  Result Date: 06/13/2016 CLINICAL DATA:  History of left lower extremity DVT. New calf swelling for 4 days. EXAM: LEFT LOWER EXTREMITY VENOUS DOPPLER ULTRASOUND TECHNIQUE: Gray-scale sonography with graded compression, as well as color Doppler and duplex ultrasound were performed to evaluate the lower extremity deep venous systems from the level of the common femoral vein and including the common femoral, femoral, profunda femoral, popliteal and calf veins including the posterior tibial, peroneal and gastrocnemius  veins when visible. The superficial great saphenous vein was also interrogated. Spectral Doppler was utilized to evaluate flow at rest and with distal augmentation maneuvers in the common femoral, femoral and popliteal veins. COMPARISON:  03/03/2016 FINDINGS: Contralateral Common Femoral Vein: Respiratory phasicity is normal and symmetric with the symptomatic side. No evidence of thrombus. Normal compressibility. The right saphenofemoral junction is patent. Common Femoral Vein: No evidence of thrombus. Normal compressibility, respiratory phasicity and response to augmentation. Saphenofemoral Junction: No evidence of thrombus. Normal compressibility and flow on color Doppler imaging. Profunda Femoral Vein: No evidence of thrombus. Normal compressibility and flow on color Doppler imaging. Femoral Vein: Again noted is echogenic thrombus in the left femoral vein with partial compressibility. There appears to be areas of occlusive disease and nonocclusive disease in the left femoral vein. Popliteal Vein: Partial compressibility of the left popliteal vein with minimal blood flow. Findings are similar to the previous examination. Calf Veins: Limited evaluation but the visualized deep calf veins are patent with compressibility and color Doppler flow. Superficial Great Saphenous Vein: Normal compressibility of the left great saphenous vein. Other Findings:  None. IMPRESSION: Persistent thrombus involving the left femoral vein and left popliteal vein. Some of the thrombus is nonocclusive. Minimal change from the exam on 03/03/2016 and findings are compatible with chronic DVT. No evidence for acute DVT. Electronically Signed   By: Markus Daft M.D.   On: 06/13/2016 12:09    Procedures Procedures (including critical care time)  Medications Ordered in ED Medications  iopamidol (ISOVUE-370) 76 % injection 100 mL (100 mLs Intravenous Contrast Given 06/13/16 1256)     Initial Impression / Assessment and Plan / ED Course    I have reviewed the triage vital signs and the nursing notes.  Pertinent labs & imaging results that were available during my care of the patient were reviewed by me and considered in my medical decision making (see chart for details).   patient presented to the emergency room with complaints of dyspnea on exertion. Patient has a history  of pulmonary embolism.  Patient states he is supposed to be on oxygen but because he was feeling well he stopped using his oxygen.  Patient recently drove back from [place] one week ago. He experienced some discomfort in his left calf because of this dyspnea over the last week he came into the emergency room to make sure he wasn't having another blood clot.  Evaluation in the ED is reassuring. CT scan does not show evidence of pulmonary wasn't. No DVT noted on Doppler study. The troponin was borderline elevated at 0.03 but no change delta troponin.  I suspect the patient's symptoms are related to his history of prior pulmonary embolism. He certainly may have a component of pulmonary hypertension considering his obesity, and previous pulmonary wasn't. Patient is supposed to be using oxygen. I explained to him that he should continue his oxygen use. He does have a component of pulmonary hypertension this will help.  Exercise-induced angina is also a possibility considering his cardiac risk factors. Serial troponins are negative. I think the patient can safely follow up as an outpatient. He and stands to return to the emergency room for any acute chest pain or worsening symptoms.  He will call his doctor.  He feels safe going home.  Incidentally the patient was having episodes of bradycardia here in the emergency room but I do not think that is related to his symptoms and I do not think it is clinically significant. He has not felt lightheaded or dizzy.  He has been asymptomatic during those episodes.    Final Clinical Impressions(s) / ED Diagnoses   Final  diagnoses:  Dyspnea on exertion    New Prescriptions New Prescriptions   No medications on file     Dorie Rank, MD 06/13/16 1454

## 2016-06-13 NOTE — Discharge Instructions (Signed)
Follow-up with your primary care doctor, make sure to start using her oxygen again, return as needed for worsening symptoms

## 2016-06-13 NOTE — ED Notes (Signed)
ED Provider at bedside. 

## 2016-06-13 NOTE — ED Notes (Signed)
Patient transported to CT 

## 2016-06-13 NOTE — ED Triage Notes (Signed)
L calf pain and SOB with exertion x 3 days. Pt drove to [place] 1 week ago.

## 2016-06-13 NOTE — ED Notes (Signed)
Pt on cardiac monitor and auto VS 

## 2016-06-13 NOTE — ED Notes (Addendum)
Pt noted to have brief moments of bradycardia dropping into the low 40's before returning back to baseline of 65. Pt remains asymptomatic during these episodes. EDP is aware.

## 2016-06-22 ENCOUNTER — Ambulatory Visit: Payer: Self-pay | Admitting: Genetics

## 2016-06-22 ENCOUNTER — Encounter: Payer: Self-pay | Admitting: Genetics

## 2016-06-22 DIAGNOSIS — Z1379 Encounter for other screening for genetic and chromosomal anomalies: Secondary | ICD-10-CM

## 2016-06-22 HISTORY — DX: Encounter for other screening for genetic and chromosomal anomalies: Z13.79

## 2016-06-22 NOTE — Progress Notes (Signed)
HPI: Mr. Granquist was previously seen in the [facility]etics clinic on 05/14/2016 due to a personal and family history of cancer and concerns regarding a hereditary predisposition to cancer. Please refer to our prior cancer genetics clinic note for more information regarding Mr. Brozowski's medical, social and family histories, and our assessment and recommendations, at the time. Mr. Haugan's recent genetic test results were disclosed to him, as were recommendations warranted by these results. These results and recommendations are discussed in more detail below.  CANCER HISTORY: In 2008, at the age of 18, Mr. Leung was diagnosed with prostate cancer. This was treated with a radioactive seed implant. He reports that he has since been doing very well. Mr. Rahal also has a history of a hyperplastic polyp in 2005. He reports that he has not had another colonoscopy since then. I encouraged him to schedule a colonoscopy soon unless otherwise advised by his physicans.  FAMILY HISTORY:  We obtained a detailed, 4-generation family history.  Significant diagnoses are listed below: Family History  Problem Relation Age of Onset  . Breast cancer Mother 18    d.89  . Breast cancer Sister 30    d.30  . Leukemia Brother 18    d.20  . Breast cancer Maternal Aunt 40    d.40s  . Lung cancer Maternal Uncle   . Prostate cancer Paternal Uncle   . Prostate cancer Brother     recurred recently at age 57  . Other Brother 15    spinal tumor  . Cervical cancer Other 22    d.22  . Cancer Sister 2    unspecified type  . Cancer Maternal Uncle 26    unspecified type   Mr. Kann has 4 daughters (ages 57, 57, 24, and 8) and one son (age 27) without cancers. He is one of 12 children. He has 6 sisters and 5 brothers. His sister, Trixie Rude, died at age 39 from breast cancer. Another sister, Stanton Kidney, currently has an unspecified form of cancer. One of Debra's daughters died at age 2 with possible  cervical cancer. Mr. Piascik's brother, Imir, died at 60 with leukemia that was diagnosed in his late-teens. Another brother, Fayrene Fearing, has a history of prostate cancer that recently recurred at age 18. A third brother, Clifton Custard, had a spinal tumor at age 48 that was resected, but left him wheelchair-bound. Mr. Difiore reports that 2 of his sisters Lupita Leash and [place]) who have no personal history of cancers have undergone bilateral mastectomies in response to positive genetic testing results. He reports that 2 more sisters (Ashia and Janet Berlin) have had negative genetic testing.  Mr. Kittleson mother was diagnosed with breast cancer and died shortly after at age 72. His mother was one of 12 children as well. One of her sisters, Wilnette Kales, died of breast cancer in her 52s. A brother, Perlie Gold, died of lung cancer. Another brother, Greggory Stallion, died of an unspecified form of cancer in his 19s. Neither of Mr. Carlton's maternal grandparents had cancers. His grandmother died in her 45s and his grandfather died in his late-60s.  Mr. Sibley's father died at age 58 without cancers. His father had 4 sisters and 4 brothers. One sister is living at 47 without cancers. The rest of the siblings have passed away. One brother, Alden Server, had a history of prostate cancer. The rest had no cancers. Mr. Nesler's paternal grandparents both died at later ages without cancers.  There are no known cancers in any of Mr. Cacioppo's cousins. Mr.  Korinek's maternal ancestors are of [place] descent, and paternal ancestors are of [place] American, Caucasian, and Native American descent. There is no reported Ashkenazi Jewish ancestry. There is no known consanguinity.  GENETIC TEST RESULTS: Genetic testing performed through [facility]'s Common Hereditary Cancers Panel reported out on 05/25/2016 showed no deleterious mutations. [facility] Common Hereditary Cancers Panel includes analysis of the following 46 genes: APC, ATM, AXIN2, BARD1,  BMPR1A, BRCA1, BRCA2, BRIP1, CDH1, CDKN2A, CHEK2, CTNNA1, DICER1, EPCAM, GREM1, HOXB13, KIT, MEN1, MLH1, MSH2, MSH3, MSH6, MUTYH, NBN, NF1, NTHL1, PALB2, PDGFRA, PMS2, POLD1, POLE, PTEN, RAD50, RAD51C, RAD51D, SDHA, SDHB, SDHC, SDHD, SMAD4, SMARCA4, STK11, TP53, TSC1, TSC2, and VHL.  The test report will be scanned into EPIC and will be located under the Molecular Pathology section of the Results Review tab.A portion of the result report is included below for reference.    We discussed with Mr. Boffa that since the current genetic testing is not perfect, it is possible there may be a gene mutation in one of these genes that current testing cannot detect, but that chance is small. We also discussed, that it is possible that another gene that has not yet been discovered, or that we have not yet tested, is responsible for the cancer diagnoses in the family. Therefore, important to remain in touch with cancer genetics in the future so that we can continue to offer Mr. Intriago the most up to date genetic testing.   CANCER SCREENING RECOMMENDATIONS: Given Mr. Senn personal and family histories, we must interpret these negative results with some caution.  Families with features suggestive of hereditary risk for cancer tend to have multiple family members with cancer, diagnoses in multiple generations and diagnoses before the age of 16. Mr. Montville's family exhibits some of these features. Thus this result may simply reflect our current inability to detect all mutations within these genes or there may be a different gene that has not yet been discovered or tested. Because no causative or actionable mutations were identified, Mr. Snee's cancer treatments and surveillance must be based on other aspects of his health history and family history rather than these results. Mr. Holzheimer was advised to follow the cancer screening plan recommended by his referring provider.  As mentioned above, Mr. Voris  reported two of his sisters Lupita Leash and [place]) tested positive for "the breast cancer gene". I reviewed with Mr. Steeves that is important that he obtain a copy of Lupita Leash and Shauna's results so that we may provide our fullest risk-assessment for Mr. Mallick and his family. Mr. Mahany was encouraged to obtain a copy of his sisters' genetic testing results for inclusion in his own records. Without documentation of his sisters' genetic testing results, our cancer risk assessment for Mr. Hazzard and his children remains significantly limited.  RECOMMENDATIONS FOR FAMILY MEMBERS: Women in this family might be at some increased risk of developing cancer, over the general population risk, simply due to the family history of cancer. We recommended women in this family have a yearly mammogram beginning at age 29, or 23 years younger than the earliest onset of cancer, an annual clinical breast exam, and perform monthly breast self-exams. Women in this family should also have a gynecological exam as recommended by their primary provider. All family members should have a colonoscopy by age 64. Men in this family should consult their physicians regarding prostate cancer screening recommendations in the context of family history.  Based on Mr. Brabson's family history, it appears that several family members,  including many siblings, would qualify for genetic counseling and testing if not already performed. Mr. Chanthavong will let us know if we can be of any assistance in coordinating genetic counseling and/or testing for family members. Because Mr. Nikolai results were negative, there is no genetic testing I recommend for his children at this time. However, their maternal family history has not been assessed and may impact whether they should pursue genetic testing. Furthermore, without documentation of Mr. Reimers's sisters' genetic testing results, we cannot be certain that Mr. Cleland has tested negative for the  mutation that his sisters are reported to carry.  FOLLOW-UP: Lastly, we discussed with Mr. Goeken that cancer genetics is a rapidly advancing field and it is possible that new genetic tests will be appropriate for him and/or his family members in the future. We encouraged him to remain in contact with cancer genetics on an annual basis so we can update his personal and family histories and let him know of advances in cancer genetics that may benefit this family.   Our contact number was provided. Mr. Rauls's questions were answered to his satisfaction, and he knows he is welcome to call us at anytime with additional questions or concerns.   Mal Misty, MS, [facility] Certified Naval [email or web address]

## 2016-06-22 NOTE — Telephone Encounter (Signed)
Reviewed that germline genetic testing revealed no pathogenic mutations. This is considered to be a negative result. Testing was performed through [facility]'s 46-gene Common Hereditary Cancers Panel. [facility] Common Hereditary Cancers Panel includes analysis of the following 46 genes: APC, ATM, AXIN2, BARD1, BMPR1A, BRCA1, BRCA2, BRIP1, CDH1, CDKN2A, CHEK2, CTNNA1, DICER1, EPCAM, GREM1, HOXB13, KIT, MEN1, MLH1, MSH2, MSH3, MSH6, MUTYH, NBN, NF1, NTHL1, PALB2, PDGFRA, PMS2, POLD1, POLE, PTEN, RAD50, RAD51C, RAD51D, SDHA, SDHB, SDHC, SDHD, SMAD4, SMARCA4, STK11, TP53, TSC1, TSC2, and VHL.  For more detailed discussion, please see genetic counseling documentation from 06/22/2016. Result report dated 05/25/2016.

## 2016-06-23 MED FILL — JARDIANCE 10 MG TABLET: 10 | 30 days supply | Qty: 30 | Fill #1

## 2016-07-09 ENCOUNTER — Other Ambulatory Visit (HOSPITAL_BASED_OUTPATIENT_CLINIC_OR_DEPARTMENT_OTHER): Payer: Medicare Other

## 2016-07-09 ENCOUNTER — Ambulatory Visit (HOSPITAL_BASED_OUTPATIENT_CLINIC_OR_DEPARTMENT_OTHER): Payer: Medicare Other | Admitting: Hematology & Oncology

## 2016-07-09 VITALS — BP 154/75 | HR 66 | Temp 97.9°F | Resp 18 | Wt 330.0 lb

## 2016-07-09 DIAGNOSIS — I824Y2 Acute embolism and thrombosis of unspecified deep veins of left proximal lower extremity: Secondary | ICD-10-CM | POA: Diagnosis not present

## 2016-07-09 DIAGNOSIS — C61 Malignant neoplasm of prostate: Secondary | ICD-10-CM | POA: Diagnosis not present

## 2016-07-09 DIAGNOSIS — I2601 Septic pulmonary embolism with acute cor pulmonale: Secondary | ICD-10-CM

## 2016-07-09 DIAGNOSIS — D509 Iron deficiency anemia, unspecified: Secondary | ICD-10-CM

## 2016-07-09 DIAGNOSIS — I2699 Other pulmonary embolism without acute cor pulmonale: Secondary | ICD-10-CM

## 2016-07-09 DIAGNOSIS — I2782 Chronic pulmonary embolism: Secondary | ICD-10-CM

## 2016-07-09 LAB — CBC WITH DIFFERENTIAL (CANCER CENTER ONLY)
BASO#: 0 10*3/uL (ref 0.0–0.2)
BASO%: 0.5 % (ref 0.0–2.0)
EOS%: 0.2 % (ref 0.0–7.0)
Eosinophils Absolute: 0 10*3/uL (ref 0.0–0.5)
HCT: 46.6 % (ref 38.7–49.9)
HGB: 15.1 g/dL (ref 13.0–17.1)
LYMPH#: 1.7 10*3/uL (ref 0.9–3.3)
LYMPH%: 19.4 % (ref 14.0–48.0)
MCH: 27.1 pg — ABNORMAL LOW (ref 28.0–33.4)
MCHC: 32.4 g/dL (ref 32.0–35.9)
MCV: 84 fL (ref 82–98)
MONO#: 0.5 10*3/uL (ref 0.1–0.9)
MONO%: 5.5 % (ref 0.0–13.0)
NEUT#: 6.6 10*3/uL — ABNORMAL HIGH (ref 1.5–6.5)
NEUT%: 74.4 % (ref 40.0–80.0)
Platelets: 298 10*3/uL (ref 145–400)
RBC: 5.57 10*6/uL (ref 4.20–5.70)
RDW: 14.4 % (ref 11.1–15.7)
WBC: 8.9 10*3/uL (ref 4.0–10.0)

## 2016-07-09 LAB — CMP (CANCER CENTER ONLY)
ALT(SGPT): 26 U/L (ref 10–47)
AST: 18 U/L (ref 11–38)
Albumin: 3.7 g/dL (ref 3.3–5.5)
Alkaline Phosphatase: 101 U/L — ABNORMAL HIGH (ref 26–84)
BUN, Bld: 14 mg/dL (ref 7–22)
CO2: 29 mEq/L (ref 18–33)
Calcium: 8.9 mg/dL (ref 8.0–10.3)
Chloride: 103 mEq/L (ref 98–108)
Creat: 1.3 mg/dl — ABNORMAL HIGH (ref 0.6–1.2)
Glucose, Bld: 201 mg/dL — ABNORMAL HIGH (ref 73–118)
Potassium: 3.6 mEq/L (ref 3.3–4.7)
Sodium: 143 mEq/L (ref 128–145)
Total Bilirubin: 0.8 mg/dl (ref 0.20–1.60)
Total Protein: 8.2 g/dL — ABNORMAL HIGH (ref 6.4–8.1)

## 2016-07-09 NOTE — Progress Notes (Signed)
Hematology and Oncology Follow Up Visit  Thomas [name] [id number] [birth year] 66 y.o. 07/09/2016   Principle Diagnosis:   Bilateral pulmonary emboli  Left femoral vein and popliteal vein thrombus  Positive lupus anticoagulant  Current Therapy:    Xarelto 20 mg by mouth daily-to complete 1 year therapy in October 2018     Interim History:  Thomas [name] is back for follow-up. He is doing fairly well. He said he was have some pain in his legs. He says that he had an MRI of his leg. I cannot find this report anywhere.  He did have a Doppler of his left leg in April. This showed persistent thrombus in the left femoral vein to left popliteal vein. There was minimal change from a Doppler done back in January.  He did have a CT angiogram of his chest in April. This was negative for any pulmonary emboli.  He does have a positive lupus anticoagulant by his second test. As such, I think that we have to consider this as part of the etiology for his thromboembolic disease. ort of breath on occasion.  There is no change in bowel or bladder habits.  He's had no bleeding.  His last PSA was less than 0.1. This was in December 2017.  Overall, his performance status is ECOG 1.  Medications:  Current Outpatient Prescriptions:  .  amLODipine (NORVASC) 10 MG tablet, Take 1 tablet (10 mg total) by mouth daily. For high blood pressure, Disp: 90 tablet, Rfl: 2 .  atorvastatin (LIPITOR) 10 MG tablet, Take one tablet by mouth once daily for cholesterol, Disp: 90 tablet, Rfl: 3 .  benzonatate (TESSALON) 100 MG capsule, Take 1 capsule (100 mg total) by mouth every 8 (eight) hours., Disp: 15 capsule, Rfl: 0 .  carvedilol (COREG) 6.25 MG tablet, Take 1 tablet (6.25 mg total) by mouth 2 (two) times daily with a meal. For high blood pressure, Disp: 180 tablet, Rfl: 2 .  ciprofloxacin (CIPRO) 500 MG tablet, Take one tablet by mouth twice daily for 7 days for infection, Disp: 14 tablet, Rfl: 0 .   empagliflozin (JARDIANCE) 10 MG TABS tablet, Take 10 mg by mouth daily., Disp: 30 tablet, Rfl: 6 .  glipiZIDE (GLUCOTROL) 5 MG tablet, Take 1 tablet (5 mg total) by mouth daily before breakfast., Disp: 90 tablet, Rfl: 2 .  hydrALAZINE (APRESOLINE) 25 MG tablet, Take 1 tablet (25 mg total) by mouth 3 (three) times daily., Disp: 90 tablet, Rfl: 3 .  lisinopril (PRINIVIL,ZESTRIL) 40 MG tablet, Take 1 tablet (40 mg total) by mouth daily. For high blood pressure, Disp: 90 tablet, Rfl: 1 .  metFORMIN (GLUCOPHAGE) 500 MG tablet, TAKE 1 TABLET BY MOUTH 2 TIMES A DAY WITH A MEAL for diabetes, Disp: 180 tablet, Rfl: 3 .  omeprazole (PRILOSEC) 20 MG capsule, Take 1 capsule (20 mg total) by mouth as needed., Disp: 30 capsule, Rfl: 3 .  potassium chloride SA (K-DUR,KLOR-CON) 20 MEQ tablet, Take 20 mEq by mouth 2 (two) times daily., Disp: , Rfl:  .  rivaroxaban (XARELTO) 20 MG TABS tablet, Take 1 tablet (20 mg total) by mouth daily with supper., Disp: 30 tablet, Rfl: 12 .  traMADol (ULTRAM) 50 MG tablet, Take 1 tablet (50 mg total) by mouth every 8 (eight) hours as needed. for pain, Disp: 90 tablet, Rfl: 1 .  TRUE METRIX BLOOD GLUCOSE TEST test strip, CHECK BLOOD SUGAR TWICE PER DAY, Disp: 100 each, Rfl: PRN .  TRUEPLUS LANCETS 30G MISC, USE TO  TEST BLOOD SUGAR 2 TIMES A DAY, Disp: 100 each, Rfl: 12  Allergies: No Known Allergies  Past Medical History, Surgical history, Social history, and Family History were reviewed and updated.  Review of Systems: As above  Physical Exam:  weight is 330 lb (149.7 kg) (abnormal). His oral temperature is 97.9 F (36.6 C). His blood pressure is 154/75 (abnormal) and his pulse is 66. His respiration is 18 and oxygen saturation is 91%.   Wt Readings from Last 3 Encounters:  07/09/16 (!) 330 lb (149.7 kg)  06/13/16 (!) 336 lb (152.4 kg)  05/21/16 (!) 336 lb (152.4 kg)     Head and neck exam shows no ocular or oral lesions. He has no palpable cervical or supraclavicular  lymph nodes. Lungs are clear bilaterally. He has no rales, wheezes or rhonchi. Cardiac exam regular rate and rhythm with no murmurs, rubs or bruits. Abdomen is soft. He has good bowel sounds. There is no fluid wave. He is obese. There is no palpable liver or spleen tip. Back exam shows no tenderness over the spine, ribs or hips. Extremities shows no clubbing, cyanosis or edema. He does have some nonpitting edema of the left leg. He has a negative Homans sign in the left leg. No palpable venous cord is noted in his legs. Neurological exam shows no focal neurological deficits. Skin exam shows no rashes, ecchymoses or petechia.   Lab Results  Component Value Date   WBC 8.9 07/09/2016   HGB 15.1 07/09/2016   HCT 46.6 07/09/2016   MCV 84 07/09/2016   PLT 298 07/09/2016     Chemistry      Component Value Date/Time   NA 143 07/09/2016 1154   NA 141 04/07/2016 1130   K 3.6 07/09/2016 1154   K 4.1 04/07/2016 1130   CL 103 07/09/2016 1154   CO2 29 07/09/2016 1154   CO2 22 04/07/2016 1130   BUN 14 07/09/2016 1154   BUN 12.2 04/07/2016 1130   CREATININE 1.3 (H) 07/09/2016 1154   CREATININE 1.6 (H) 04/07/2016 1130      Component Value Date/Time   CALCIUM 8.9 07/09/2016 1154   CALCIUM 9.3 04/07/2016 1130   ALKPHOS 101 (H) 07/09/2016 1154   ALKPHOS 73 04/07/2016 1130   AST 18 07/09/2016 1154   AST 14 04/07/2016 1130   ALT 26 07/09/2016 1154   ALT 21 04/07/2016 1130   BILITOT 0.80 07/09/2016 1154   BILITOT 0.71 04/07/2016 1130         Impression and Plan: Thomas [name] is a 66 year old African-American male. 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  I believe that he does have a positive lupus anticoagulant. I believe this is a true finding.  We will keep him on prolonged Xarelto. However, we will make him transition over to maintenance dose of 10 mg in October.  We'll begin him on maintenance Xarelto, I will also add some baby aspirin.  I have noted  that he did have genetic testing. He was negative for the BRCA mutation.   We will see him back in October.   Volanda Napoleon, MD 5/24/20182:35 PM

## 2016-07-10 LAB — D-DIMER, QUANTITATIVE: D-DIMER: <0.2 mg/L FEU (ref 0.00–0.49)

## 2016-07-12 LAB — LUPUS ANTICOAGULANT PANEL
PTT-LA: 49.2 s (ref 0.0–51.9)
dRVVT Confirm: >2.4 ratio — ABNORMAL HIGH (ref 0.8–1.2)
dRVVT Mix: 93.8 s — ABNORMAL HIGH (ref 0.0–47.0)
dRVVT: >180 s — ABNORMAL HIGH (ref 0.0–47.0)

## 2016-07-23 MED FILL — JARDIANCE 10 MG TABLET: 10 | 30 days supply | Qty: 30 | Fill #2

## 2016-07-23 MED FILL — POTASSIUM CL ER 20 MEQ TABL: 20 | 45 days supply | Qty: 45 | Fill #1

## 2016-08-11 ENCOUNTER — Other Ambulatory Visit: Payer: Self-pay | Admitting: Nurse Practitioner

## 2016-08-11 DIAGNOSIS — I10 Essential (primary) hypertension: Secondary | ICD-10-CM

## 2016-08-11 MED FILL — LISINOPRIL 40 MG TAB: 40 | 90 days supply | Qty: 90 | Fill #0

## 2016-08-18 ENCOUNTER — Telehealth: Payer: Self-pay

## 2016-08-18 NOTE — Telephone Encounter (Signed)
A fax was received from [employer] requesting that patient demographics, medication list, and allergy list be faxed to them along with signed prescriptions for calcipotriene 0.005% cream and flucinonide 0.1 % cream.   Left a message asking patient to call the office to verify if they requested these medications from this company.  [facility] [address] [address]  Phone: [phone] Fax: [phone]

## 2016-08-21 ENCOUNTER — Emergency Department (HOSPITAL_BASED_OUTPATIENT_CLINIC_OR_DEPARTMENT_OTHER)
Admission: EM | Admit: 2016-08-21 | Discharge: 2016-08-21 | Disposition: A | Discharge status: Home / Self Care | Payer: Medicare Other | Attending: Emergency Medicine | Admitting: Emergency Medicine

## 2016-08-21 ENCOUNTER — Emergency Department (HOSPITAL_BASED_OUTPATIENT_CLINIC_OR_DEPARTMENT_OTHER): Payer: Medicare Other

## 2016-08-21 ENCOUNTER — Encounter (HOSPITAL_BASED_OUTPATIENT_CLINIC_OR_DEPARTMENT_OTHER): Payer: Self-pay | Admitting: Emergency Medicine

## 2016-08-21 DIAGNOSIS — Z8546 Personal history of malignant neoplasm of prostate: Secondary | ICD-10-CM | POA: Insufficient documentation

## 2016-08-21 DIAGNOSIS — W010XXA Fall on same level from slipping, tripping and stumbling without subsequent striking against object, initial encounter: Secondary | ICD-10-CM | POA: Diagnosis not present

## 2016-08-21 DIAGNOSIS — I129 Hypertensive chronic kidney disease with stage 1 through stage 4 chronic kidney disease, or unspecified chronic kidney disease: Secondary | ICD-10-CM | POA: Insufficient documentation

## 2016-08-21 DIAGNOSIS — M25561 Pain in right knee: Secondary | ICD-10-CM | POA: Diagnosis not present

## 2016-08-21 DIAGNOSIS — Y998 Other external cause status: Secondary | ICD-10-CM | POA: Insufficient documentation

## 2016-08-21 DIAGNOSIS — E1122 Type 2 diabetes mellitus with diabetic chronic kidney disease: Secondary | ICD-10-CM | POA: Insufficient documentation

## 2016-08-21 DIAGNOSIS — Z79899 Other long term (current) drug therapy: Secondary | ICD-10-CM | POA: Diagnosis not present

## 2016-08-21 DIAGNOSIS — Y929 Unspecified place or not applicable: Secondary | ICD-10-CM | POA: Diagnosis not present

## 2016-08-21 DIAGNOSIS — Z7901 Long term (current) use of anticoagulants: Secondary | ICD-10-CM | POA: Insufficient documentation

## 2016-08-21 DIAGNOSIS — N189 Chronic kidney disease, unspecified: Secondary | ICD-10-CM | POA: Diagnosis not present

## 2016-08-21 DIAGNOSIS — Z7984 Long term (current) use of oral hypoglycemic drugs: Secondary | ICD-10-CM | POA: Insufficient documentation

## 2016-08-21 DIAGNOSIS — Y9367 Activity, basketball: Secondary | ICD-10-CM | POA: Diagnosis not present

## 2016-08-21 MED ORDER — KETOROLAC TROMETHAMINE 60 MG/2ML IM SOLN
30.0000 mg | Freq: Once | INTRAMUSCULAR | Status: AC
  Administered 2016-08-21: 30 mg via INTRAMUSCULAR

## 2016-08-21 MED ORDER — DICLOFENAC SODIUM 1 % TD GEL: 2.0000 g | Freq: Four times a day (QID) | TRANSDERMAL | 0 refills | Status: DC

## 2016-08-21 MED ORDER — KETOROLAC TROMETHAMINE 60 MG/2ML IM SOLN
60.0000 mg | Freq: Once | INTRAMUSCULAR | Status: DC
  Filled 2016-08-21: qty 2

## 2016-08-21 MED FILL — VOLTAREN 1% GEL: 1 | 13 days supply | Qty: 100 | Fill #0

## 2016-08-21 NOTE — ED Provider Notes (Signed)
[place] DEPT [facility] Provider Note   CSN: [id number] Arrival date & time: 08/21/16  1053     History   Chief Complaint Chief Complaint  Patient presents with  . Knee Pain    HPI Thomas [name] is a 66 y.o. male.  HPI here for evaluation of right knee pain. Patient reports he injured his knee roughly a month ago playing basketball, falling forward and landing directly on his right knee. He reports since that time he has had gradual improvement in swelling and bruising. However, yesterday morning at 6 AM, he got his foot caught in a piece of furniture and twisted his right knee which worsened his discomfort. He reports pain with walking and with certain movements. No fevers, chills, numbness or weakness.  Past Medical History:  Diagnosis Date  . Arthritis   . Cancer [facility]) 2010   Prostate  . Chronic kidney disease   . Diabetes mellitus   . DVT (deep vein thrombosis) in pregnancy ([place])   . Genetic testing 06/22/2016   Mr. Frankowski underwent genetic counseling and testing for hereditary cancer syndromes on 05/14/2016. His results were negative for mutations in all 46 genes analyzed by [facility]'s 46-gene Common Hereditary Cancers Panel. Genes analyzed include: APC, ATM, AXIN2, BARD1, BMPR1A, BRCA1, BRCA2, BRIP1, CDH1, CDKN2A, CHEK2, CTNNA1, DICER1, EPCAM, GREM1, HOXB13, KIT, MEN1, MLH1, MSH2, MSH3, MSH6, MUTYH, NB  . Hypertension   . PE (pulmonary thromboembolism) [facility])     Patient Active Problem List   Diagnosis Date Noted  . Genetic testing 06/22/2016  . DVT, lower extremity, proximal, acute, left ([place]) 01/23/2016  . Hematuria 12/12/2015  . Pulmonary embolism ([place]) 11/23/2015  . Type 2 diabetes mellitus with chronic kidney disease, without long-term current use of insulin ([place]) 12/05/2014  . Benign prostatic hyperplasia with urinary obstruction 07/25/2014  . Urine retention 07/25/2014  . Urinary tract infection 07/09/2014  . Anemia, iron deficiency 11/22/2013  . Urinary  retention 11/01/2013  . Incomplete emptying of bladder 07/26/2013  . Nocturia 07/26/2013  . Other nonspecific finding on examination of urine 07/26/2013  . Sleep apnea 07/26/2013  . Urethral stricture 07/26/2013  . Malignant neoplasm of prostate ([place]) 07/04/2013  . Bladder neck contracture 07/04/2013  . Severe obesity (BMI >= 40) ([place]) 05/23/2013  . B12 deficiency 05/23/2013  . Hyperlipidemia with target LDL less than 100 09/02/2012  . HTN (hypertension) 05/26/2012  . Osteoarthritis, knee 05/26/2012  . MORBID OBESITY 08/22/2008    Past Surgical History:  Procedure Laterality Date  . HERNIA REPAIR    . KNEE SURGERY    . PROSTATE SURGERY    . RADIOACTIVE SEED IMPLANT    . SHOULDER SURGERY    . uretha surgery-2014         Home Medications    Prior to Admission medications   Medication Sig Start Date End Date Taking? Authorizing Provider  amLODipine (NORVASC) 10 MG tablet Take 1 tablet (10 mg total) by mouth daily. For high blood pressure 05/21/16   [name], NP  atorvastatin (LIPITOR) 10 MG tablet Take one tablet by mouth once daily for cholesterol 09/30/15   [name], NP  benzonatate (TESSALON) 100 MG capsule Take 1 capsule (100 mg total) by mouth every 8 (eight) hours. 03/07/16   Doristine Devoid, PA-C  carvedilol (COREG) 6.25 MG tablet Take 1 tablet (6.25 mg total) by mouth 2 (two) times daily with a meal. For high blood pressure 01/03/16   [name], NP  ciprofloxacin (CIPRO) 500  MG tablet Take one tablet by mouth twice daily for 7 days for infection 06/03/16   Sharon Seller, NP  diclofenac sodium (VOLTAREN) 1 % GEL Apply 2 g topically 4 (four) times daily. 08/21/16   Mattalynn Crandle, Sharlet [place]ina, PA-C  empagliflozin (JARDIANCE) 10 MG TABS tablet Take 10 mg by mouth daily. 05/05/16   Sharon Seller, NP  glipiZIDE (GLUCOTROL) 5 MG tablet Take 1 tablet (5 mg total) by mouth daily before breakfast. 01/03/16   Sharon Seller, NP  hydrALAZINE  (APRESOLINE) 25 MG tablet Take 1 tablet (25 mg total) by mouth 3 (three) times daily. 02/20/16   Sharon Seller, NP  lisinopril (PRINIVIL,ZESTRIL) 40 MG tablet TAKE 1 TABLET BY MOUTH DAILY FOR HIGH BLOOD PRESSURE 08/11/16   Sharon Seller, NP  metFORMIN (GLUCOPHAGE) 500 MG tablet TAKE 1 TABLET BY MOUTH 2 TIMES A DAY WITH A MEAL for diabetes 01/21/16   Kimber Relic, MD  omeprazole (PRILOSEC) 20 MG capsule Take 1 capsule (20 mg total) by mouth as needed. 05/24/14   Sharon Seller, NP  potassium chloride SA (K-DUR,KLOR-CON) 20 MEQ tablet Take 20 mEq by mouth 2 (two) times daily.    [provider]  rivaroxaban (XARELTO) 20 MG TABS tablet Take 1 tablet (20 mg total) by mouth daily with supper. 01/23/16   Josph Macho, MD  traMADol (ULTRAM) 50 MG tablet Take 1 tablet (50 mg total) by mouth every 8 (eight) hours as needed. for pain 09/30/15   Sharon Seller, NP  TRUE METRIX BLOOD GLUCOSE TEST test strip CHECK BLOOD SUGAR TWICE PER DAY 10/24/15   Sharon Seller, NP  TRUEPLUS LANCETS 30G MISC USE TO TEST BLOOD SUGAR 2 TIMES A DAY 09/12/14   Sharon Seller, NP    Family History Family History  Problem Relation Age of Onset  . Breast cancer Mother 48       d.89  . Breast cancer Sister 30       d.30  . Leukemia Brother 18       d.20  . Breast cancer Maternal Aunt 40       d.40s  . Lung cancer Maternal Uncle   . Prostate cancer Paternal Uncle   . Prostate cancer Brother        recurred recently at age 56  . Other Brother 15       spinal tumor  . Cervical cancer Other 22       d.22  . Cancer Sister 61       unspecified type  . Cancer Maternal Uncle 2       unspecified type    Social History Social History  Substance Use Topics  . Smoking status: Never Smoker  . Smokeless tobacco: Never Used  . Alcohol use No     Comment: never     Allergies   Lisinopril   Review of Systems Review of Systems See history of present illness  Physical Exam Updated  Vital Signs BP (!) 163/90 (BP Location: Right Arm)   Pulse 72   Temp 97.7 F (36.5 C) (Oral)   Resp 20   Ht 5\' 9"  (1.753 m)   Wt (!) 145.2 kg (320 lb)   SpO2 97%   BMI 47.26 kg/m   Physical Exam  Constitutional: He appears well-developed and well-nourished.  HENT:  Head: Normocephalic and atraumatic.  Eyes: Conjunctivae and EOM are normal. Right eye exhibits no discharge. Left eye exhibits no discharge. No scleral icterus.  Neck: Normal range of motion. Neck supple.  Cardiovascular:  Peripheral pulses intact at injured extremity.  Pulmonary/Chest: Effort normal. No respiratory distress.  Musculoskeletal: He exhibits tenderness. He exhibits no edema or deformity.  Right knee: Mild diffuse swelling about right knee. Minimal tenderness in bilateral femorotibial joint lines. No erythema. Maintains good range of motion. Distal pulses are intact with brisk cap refill. No ligamentous laxity, unilateral leg swelling or other abnormal findings.  Neurological:  No Numbness distal to injury  Skin: Skin is warm and dry. No rash noted.  Psychiatric: He has a normal mood and affect. His behavior is normal.  Nursing note and vitals reviewed.    ED Treatments / Results  Labs (all labs ordered are listed, but only abnormal results are displayed) Labs Reviewed - No data to display  EKG  EKG Interpretation None       Radiology Dg Knee Complete 4 Views Right  Result Date: 08/21/2016 CLINICAL DATA:  Twisted right knee 2 days ago.  Pain. EXAM: RIGHT KNEE - COMPLETE 4+ VIEW COMPARISON:  10/15/2009 FINDINGS: Advanced tricompartment degenerative changes with joint space narrowing and spurring. Small joint effusion. No acute bony abnormality. Specifically, no fracture, subluxation, or dislocation. Soft tissues are intact. IMPRESSION: Advanced tricompartment degenerative changes. No acute bony abnormality. Electronically Signed   By: Rolm Baptise M.D.   On: 08/21/2016 11:50     Procedures Procedures (including critical care time)  Medications Ordered in ED Medications  ketorolac (TORADOL) injection 30 mg (30 mg Intramuscular Given 08/21/16 1134)     Initial Impression / Assessment and Plan / ED Course  I have reviewed the triage vital signs and the nursing notes.  Pertinent labs & imaging results that were available during my care of the patient were reviewed by me and considered in my medical decision making (see chart for details).     Symptoms likely musculoskeletal in nature. X-rays and physical exam reassuring. Patient has an orthopedist he can follow up with. Given knee immobilizer in ED for comfort. Also given prescription for Voltaren topical gel. Discussed continued supportive care at home. Return precautions discussed.  Final Clinical Impressions(s) / ED Diagnoses   Final diagnoses:  Acute pain of right knee    New Prescriptions New Prescriptions   DICLOFENAC SODIUM (VOLTAREN) 1 % GEL    Apply 2 g topically 4 (four) times daily.     Comer Locket, PA-C 08/21/16 [place], DO 08/21/16 [phone]

## 2016-08-21 NOTE — ED Triage Notes (Signed)
Patient reports right knee pain x 2 weeks after a fall.  Reports eacchymosis and swelling to right knee.

## 2016-08-21 NOTE — Telephone Encounter (Signed)
Attempted to call patient regarding use of [employer] but I did not get an answer. Patient is currently in ER per Epic note. Will try again later.

## 2016-08-21 NOTE — Discharge Instructions (Signed)
Wear your knee immobilizer while active as we discussed. You may use your topical gel and Tylenol to help with her symptoms. Follow-up with your orthopedist as we discussed. He may also follow-up with your PCP. Return to ED for new or worsening symptoms as we discussed

## 2016-08-21 NOTE — ED Notes (Signed)
Pt in wheelchair. 

## 2016-08-21 NOTE — ED Notes (Signed)
Patient transported to X-ray 

## 2016-08-24 ENCOUNTER — Telehealth: Payer: Self-pay

## 2016-08-24 NOTE — Telephone Encounter (Signed)
I spoke with patient and he stated that he did not want to receive or send any information to or from [employer].   Forms were faxed back with message that patient is not interested in receiving future requests.

## 2016-08-24 NOTE — Telephone Encounter (Signed)
Received message on voice mail [phone] Ref [phone] about Calcipotriene cream faxed on 6/28 and 6/30. Called them back to let them know that the office spoke with the patient and he did not want this cream. He will mark this denied.

## 2016-08-27 DIAGNOSIS — R339 Retention of urine, unspecified: Secondary | ICD-10-CM | POA: Diagnosis not present

## 2016-08-27 DIAGNOSIS — R8299 Other abnormal findings in urine: Secondary | ICD-10-CM | POA: Diagnosis not present

## 2016-08-27 DIAGNOSIS — R319 Hematuria, unspecified: Secondary | ICD-10-CM | POA: Diagnosis not present

## 2016-08-27 DIAGNOSIS — C61 Malignant neoplasm of prostate: Secondary | ICD-10-CM | POA: Diagnosis not present

## 2016-09-07 MED FILL — JARDIANCE 10 MG TABLET: 10 | 30 days supply | Qty: 30 | Fill #3

## 2016-09-07 MED FILL — OMEPRAZOLE DR 20 MG CAPSULE: 20 | 30 days supply | Qty: 30 | Fill #1

## 2016-09-14 ENCOUNTER — Ambulatory Visit (INDEPENDENT_AMBULATORY_CARE_PROVIDER_SITE_OTHER): Payer: Medicare Other | Admitting: Orthopaedic Surgery

## 2016-09-14 ENCOUNTER — Encounter (INDEPENDENT_AMBULATORY_CARE_PROVIDER_SITE_OTHER): Payer: Self-pay

## 2016-09-15 ENCOUNTER — Ambulatory Visit (INDEPENDENT_AMBULATORY_CARE_PROVIDER_SITE_OTHER): Payer: Medicare Other | Admitting: Orthopaedic Surgery

## 2016-09-15 ENCOUNTER — Encounter (INDEPENDENT_AMBULATORY_CARE_PROVIDER_SITE_OTHER): Payer: Self-pay | Admitting: Orthopaedic Surgery

## 2016-09-15 DIAGNOSIS — M1711 Unilateral primary osteoarthritis, right knee: Secondary | ICD-10-CM | POA: Diagnosis not present

## 2016-09-15 NOTE — Progress Notes (Signed)
Office Visit Note   Patient: Thomas [name]           Date of Birth: December 16, 1950           MRN: [id number] Visit Date: 09/15/2016              Requested by: [name], NP [place], [place] [address] PCP: [name], NP   Assessment & Plan: Visit Diagnoses:  1. Primary osteoarthritis of right knee     Plan: Patient has advanced degenerative joint disease of the right knee as well as morbid obesity with BMI of 47. Cortisone injection was performed today. Questions encouraged and answered. Follow-up as needed.  Follow-Up Instructions: Return if symptoms worsen or fail to improve.   Orders:  No orders of the defined types were placed in this encounter.  No orders of the defined types were placed in this encounter.     Procedures: Large Joint Inj Date/Time: 09/15/2016 10:58 AM Performed by: Leandrew Koyanagi Authorized by: Leandrew Koyanagi   Consent Given by:  Patient Timeout: prior to procedure the correct patient, procedure, and site was verified   Indications:  Pain Location:  Knee Site:  R knee Prep: patient was prepped and draped in usual sterile fashion   Needle Size:  22 G Ultrasound Guidance: No   Fluoroscopic Guidance: No   Arthrogram: No   Patient tolerance:  Patient tolerated the procedure well with no immediate complications     Clinical Data: No additional findings.   Subjective: Chief Complaint  Patient presents with  . Right Knee - Pain    Patient is a 66 year old gentleman with 2 months history of right knee pain. He was playing basketball when he jumped and since then he has had mechanical symptoms of his right knee. The knee triceps to give out. He takes tramadol for pain. He does have a history of PE and DVT for which she is on several toe.    Review of Systems  Constitutional: Negative.   All other systems reviewed and are negative.    Objective: Vital Signs: There were no vitals taken for this  visit.  Physical Exam  Constitutional: He is oriented to person, place, and time. He appears well-developed and well-nourished.  Pulmonary/Chest: Effort normal.  Abdominal: Soft.  Neurological: He is alert and oriented to person, place, and time.  Skin: Skin is warm.  Psychiatric: He has a normal mood and affect. His behavior is normal. Judgment and thought content normal.  Nursing note and vitals reviewed.   Ortho Exam Right knee exam shows a trace effusion. He has a 15 flexion contracture. Collaterals and cruciates are grossly intact. Specialty Comments:  No specialty comments available.  Imaging: No results found.   PMFS History: Patient Active Problem List   Diagnosis Date Noted  . Genetic testing 06/22/2016  . DVT, lower extremity, proximal, acute, left ([place]) 01/23/2016  . Hematuria 12/12/2015  . Pulmonary embolism ([place]) 11/23/2015  . Type 2 diabetes mellitus with chronic kidney disease, without long-term current use of insulin ([place]) 12/05/2014  . Benign prostatic hyperplasia with urinary obstruction 07/25/2014  . Urine retention 07/25/2014  . Urinary tract infection 07/09/2014  . Anemia, iron deficiency 11/22/2013  . Urinary retention 11/01/2013  . Incomplete emptying of bladder 07/26/2013  . Nocturia 07/26/2013  . Other nonspecific finding on examination of urine 07/26/2013  . Sleep apnea 07/26/2013  . Urethral stricture 07/26/2013  . Malignant neoplasm of prostate ([place]) 07/04/2013  .  Bladder neck contracture 07/04/2013  . Severe obesity (BMI >= 40) ([place]) 05/23/2013  . B12 deficiency 05/23/2013  . Hyperlipidemia with target LDL less than 100 09/02/2012  . HTN (hypertension) 05/26/2012  . Osteoarthritis, knee 05/26/2012  . MORBID OBESITY 08/22/2008   Past Medical History:  Diagnosis Date  . Arthritis   . Cancer [facility]) 2010   Prostate  . Chronic kidney disease   . Diabetes mellitus   . DVT (deep vein thrombosis) in pregnancy ([place])   . Genetic testing  06/22/2016   Mr. Mesa underwent genetic counseling and testing for hereditary cancer syndromes on 05/14/2016. His results were negative for mutations in all 46 genes analyzed by [facility]'s 46-gene Common Hereditary Cancers Panel. Genes analyzed include: APC, ATM, AXIN2, BARD1, BMPR1A, BRCA1, BRCA2, BRIP1, CDH1, CDKN2A, CHEK2, CTNNA1, DICER1, EPCAM, GREM1, HOXB13, KIT, MEN1, MLH1, MSH2, MSH3, MSH6, MUTYH, NB  . Hypertension   . PE (pulmonary thromboembolism) ([facility])     Family History  Problem Relation Age of Onset  . Breast cancer Mother 47       d.89  . Breast cancer Sister 48       d.30  . Leukemia Brother 18       d.20  . Breast cancer Maternal Aunt 40       d.40s  . Lung cancer Maternal Uncle   . Prostate cancer Paternal Uncle   . Prostate cancer Brother        recurred recently at age 68  . Other Brother 15       spinal tumor  . Cervical cancer Other 22       d.22  . Cancer Sister 24       unspecified type  . Cancer Maternal Uncle 80       unspecified type    Past Surgical History:  Procedure Laterality Date  . HERNIA REPAIR    . KNEE SURGERY    . PROSTATE SURGERY    . RADIOACTIVE SEED IMPLANT    . SHOULDER SURGERY    . uretha surgery-2014     Social History   Occupational History  . Not on file.   Social History Main Topics  . Smoking status: Never Smoker  . Smokeless tobacco: Never Used  . Alcohol use No     Comment: never  . Drug use: No  . Sexual activity: Yes

## 2016-09-22 ENCOUNTER — Ambulatory Visit (INDEPENDENT_AMBULATORY_CARE_PROVIDER_SITE_OTHER): Payer: Medicare Other | Admitting: Nurse Practitioner

## 2016-09-22 ENCOUNTER — Encounter: Payer: Self-pay | Admitting: Nurse Practitioner

## 2016-09-22 VITALS — BP 142/86 | HR 67 | Temp 97.4°F | Resp 17 | Ht 69.0 in | Wt 331.0 lb

## 2016-09-22 DIAGNOSIS — E785 Hyperlipidemia, unspecified: Secondary | ICD-10-CM

## 2016-09-22 DIAGNOSIS — M170 Bilateral primary osteoarthritis of knee: Secondary | ICD-10-CM

## 2016-09-22 DIAGNOSIS — D6859 Other primary thrombophilia: Secondary | ICD-10-CM

## 2016-09-22 DIAGNOSIS — I10 Essential (primary) hypertension: Secondary | ICD-10-CM | POA: Diagnosis not present

## 2016-09-22 DIAGNOSIS — E1122 Type 2 diabetes mellitus with diabetic chronic kidney disease: Secondary | ICD-10-CM | POA: Diagnosis not present

## 2016-09-22 LAB — COMPLETE METABOLIC PANEL WITH GFR
ALT: 8 U/L — ABNORMAL LOW (ref 9–46)
AST: 8 U/L — ABNORMAL LOW (ref 10–35)
Albumin: 3.8 g/dL (ref 3.6–5.1)
Alkaline Phosphatase: 91 U/L (ref 40–115)
BUN: 13 mg/dL (ref 7–25)
CO2: 27 mmol/L (ref 20–32)
Calcium: 9.1 mg/dL (ref 8.6–10.3)
Chloride: 101 mmol/L (ref 98–110)
Creat: 1.04 mg/dL (ref 0.70–1.25)
GFR, Est African American: 86 mL/min (ref >=60)
GFR, Est Non African American: 74 mL/min (ref >=60)
Glucose, Bld: 270 mg/dL — ABNORMAL HIGH (ref 65–99)
Potassium: 4 mmol/L (ref 3.5–5.3)
Sodium: 141 mmol/L (ref 135–146)
Total Bilirubin: 0.6 mg/dL (ref 0.2–1.2)
Total Protein: 7.2 g/dL (ref 6.1–8.1)

## 2016-09-22 LAB — LIPID PANEL
Cholesterol: 193 mg/dL (ref <200)
HDL: 62 mg/dL (ref >40)
LDL Cholesterol: 112 mg/dL — ABNORMAL HIGH (ref <100)
Total CHOL/HDL Ratio: 3.1 Ratio (ref <5.0)
Triglycerides: 95 mg/dL (ref <150)
VLDL: 19 mg/dL (ref <30)

## 2016-09-22 LAB — CBC WITH DIFFERENTIAL/PLATELET
Basophils Absolute: 0 cells/uL (ref 0–200)
Basophils Relative: 0 %
Eosinophils Absolute: 0 cells/uL — ABNORMAL LOW (ref 15–500)
Eosinophils Relative: 0 %
HCT: 46 % (ref 38.5–50.0)
Hemoglobin: 14.3 g/dL (ref 13.2–17.1)
Lymphocytes Relative: 20 %
Lymphs Abs: 1420 cells/uL (ref 850–3900)
MCH: 26.5 pg — ABNORMAL LOW (ref 27.0–33.0)
MCHC: 31.1 g/dL — ABNORMAL LOW (ref 32.0–36.0)
MCV: 85.3 fL (ref 80.0–100.0)
MPV: 10.3 fL (ref 7.5–12.5)
Monocytes Absolute: 355 cells/uL (ref 200–950)
Monocytes Relative: 5 %
Neutro Abs: 5325 cells/uL (ref 1500–7800)
Neutrophils Relative %: 75 %
Platelets: 285 10*3/uL (ref 140–400)
RBC: 5.39 MIL/uL (ref 4.20–5.80)
RDW: 15.6 % — ABNORMAL HIGH (ref 11.0–15.0)
WBC: 7.1 10*3/uL (ref 3.8–10.8)

## 2016-09-22 MED ORDER — TRAMADOL HCL 50 MG PO TABS: 50.0000 mg | ORAL_TABLET | Freq: Three times a day (TID) | ORAL | 2 refills | Status: DC | PRN

## 2016-09-22 MED ORDER — LOSARTAN POTASSIUM 100 MG PO TABS: 100.0000 mg | ORAL_TABLET | Freq: Every day | ORAL | 1 refills | Status: DC

## 2016-09-22 MED FILL — traMADol HCL 50 MG TABS: 50 | 30 days supply | Qty: 90 | Fill #0

## 2016-09-22 MED FILL — LOSARTAN POTASSIUM 100 MG T: 100 | 90 days supply | Qty: 90 | Fill #0

## 2016-09-22 NOTE — Progress Notes (Signed)
Careteam: Patient Care Team: [name], NP as PCP - General (Nurse Practitioner)  Advanced Directive information Does Patient Have a Medical Advance Directive?: No  Allergies  Allergen Reactions  . Lisinopril Cough    Chief Complaint  Patient presents with  . Medical Management of Chronic Issues    Pt is being seen for a 4 month follow up visit. Pt fell about 2 months ago and injured right knee. Pt had steroid injection 1 week ago that only helped for a few days; needs something done for knee.   . Other    Pt would like to stop taking lisinopril due to cough; needs another medication to repeat it.   . Other    Wife, Pamala Hurry in room     HPI: Patient is a 66 y.o. male seen in the office today for routine follow up.  Cont to see orthopedic for knee pain. Reports arthritic changes on xray. Getting cortisone shots. Last shot was last Thursday but did not help. Taking tramadol as needed (out right now). Only getting tramadol from [facility].   Would like different medication for lisinopril due to cough, he stopped medication and cough and mucous got better but when he restarted it got worse.  Blood pressure at home in the 160s/90s  Still following with Dr Marin Olp for PE, conts on xarelto.   Blood sugars have been up and down, lows in the 100s,no hypoglycemia. More elevated after steroid injection.   Goes through some depression at times and weight goes up when this happens. Does not want medication. Trying to change diet, cut out red meats.   Was going to the pool but due to knee he has not been going. Loves to go to the pool.  Review of Systems:  Review of Systems  Constitutional: Negative for chills, fever and weight loss.  HENT: Negative for tinnitus.   Respiratory: Positive for cough and shortness of breath (chronic with increase in activity). Negative for sputum production.   Cardiovascular: Negative for chest pain, palpitations and leg swelling.  Gastrointestinal:  Negative for abdominal pain, constipation, diarrhea and heartburn.  Genitourinary: Negative for dysuria, frequency and urgency.  Musculoskeletal: Positive for joint pain (knee). Negative for back pain, falls and myalgias.  Skin: Negative.   Neurological: Negative for dizziness and headaches.  Psychiatric/Behavioral: Positive for depression (at times). Negative for memory loss. The patient does not have insomnia.     Past Medical History:  Diagnosis Date  . Arthritis   . Cancer [facility]) 2010   Prostate  . Chronic kidney disease   . Diabetes mellitus   . DVT (deep vein thrombosis) in pregnancy ([place])   . Genetic testing 06/22/2016   Mr. Glascoe underwent genetic counseling and testing for hereditary cancer syndromes on 05/14/2016. His results were negative for mutations in all 46 genes analyzed by [facility]'s 46-gene Common Hereditary Cancers Panel. Genes analyzed include: APC, ATM, AXIN2, BARD1, BMPR1A, BRCA1, BRCA2, BRIP1, CDH1, CDKN2A, CHEK2, CTNNA1, DICER1, EPCAM, GREM1, HOXB13, KIT, MEN1, MLH1, MSH2, MSH3, MSH6, MUTYH, NB  . Hypertension   . PE (pulmonary thromboembolism) ([place])    Past Surgical History:  Procedure Laterality Date  . HERNIA REPAIR    . KNEE SURGERY    . PROSTATE SURGERY    . RADIOACTIVE SEED IMPLANT    . SHOULDER SURGERY    . uretha surgery-2014     Social History:   reports that he has never smoked. He has never used smokeless tobacco. He reports that  he does not drink alcohol or use drugs.  Family History  Problem Relation Age of Onset  . Breast cancer Mother 66       d.89  . Breast cancer Sister 30       d.30  . Leukemia Brother 18       d.20  . Breast cancer Maternal Aunt 40       d.40s  . Lung cancer Maternal Uncle   . Prostate cancer Paternal Uncle   . Prostate cancer Brother        recurred recently at age 9  . Other Brother 15       spinal tumor  . Cervical cancer Other 22       d.22  . Cancer Sister 71       unspecified type  . Cancer  Maternal Uncle 80       unspecified type    Medications: Patient's Medications  New Prescriptions   No medications on file  Previous Medications   AMLODIPINE (NORVASC) 10 MG TABLET    Take 1 tablet (10 mg total) by mouth daily. For high blood pressure   ATORVASTATIN (LIPITOR) 10 MG TABLET    Take one tablet by mouth once daily for cholesterol   CARVEDILOL (COREG) 6.25 MG TABLET    Take 1 tablet (6.25 mg total) by mouth 2 (two) times daily with a meal. For high blood pressure   DICLOFENAC SODIUM (VOLTAREN) 1 % GEL    Apply 2 g topically 4 (four) times daily.   EMPAGLIFLOZIN (JARDIANCE) 10 MG TABS TABLET    Take 10 mg by mouth daily.   GLIPIZIDE (GLUCOTROL) 5 MG TABLET    Take 1 tablet (5 mg total) by mouth daily before breakfast.   HYDRALAZINE (APRESOLINE) 25 MG TABLET    Take 1 tablet (25 mg total) by mouth 3 (three) times daily.   LISINOPRIL (PRINIVIL,ZESTRIL) 40 MG TABLET    TAKE 1 TABLET BY MOUTH DAILY FOR HIGH BLOOD PRESSURE   METFORMIN (GLUCOPHAGE) 500 MG TABLET    TAKE 1 TABLET BY MOUTH 2 TIMES A DAY WITH A MEAL for diabetes   OMEPRAZOLE (PRILOSEC) 20 MG CAPSULE    Take 1 capsule (20 mg total) by mouth as needed.   POTASSIUM CHLORIDE SA (K-DUR,KLOR-CON) 20 MEQ TABLET    Take 20 mEq by mouth 2 (two) times daily.   RIVAROXABAN (XARELTO) 20 MG TABS TABLET    Take 1 tablet (20 mg total) by mouth daily with supper.   TRAMADOL (ULTRAM) 50 MG TABLET    Take 1 tablet (50 mg total) by mouth every 8 (eight) hours as needed. for pain   TRUE METRIX BLOOD GLUCOSE TEST TEST STRIP    CHECK BLOOD SUGAR TWICE PER DAY   TRUEPLUS LANCETS 30G MISC    USE TO TEST BLOOD SUGAR 2 TIMES A DAY  Modified Medications   No medications on file  Discontinued Medications   BENZONATATE (TESSALON) 100 MG CAPSULE    Take 1 capsule (100 mg total) by mouth every 8 (eight) hours.   CIPROFLOXACIN (CIPRO) 500 MG TABLET    Take one tablet by mouth twice daily for 7 days for infection     Physical Exam:  Vitals:    09/22/16 0944  BP: (!) 142/86  Pulse: 67  Resp: 17  Temp: (!) 97.4 F (36.3 C)  TempSrc: Oral  SpO2: 91%  Weight: (!) 331 lb (150.1 kg)  Height: 5\' 9"  (1.753 m)   Body mass  index is 48.88 kg/m.  Physical Exam  Constitutional: He is oriented to person, place, and time. He appears well-developed and well-nourished.  Obese male, NAD  HENT:  Head: Normocephalic and atraumatic.  Mouth/Throat: No oropharyngeal exudate.  Eyes: Pupils are equal, round, and reactive to light.  Neck: Normal range of motion. Neck supple. Carotid bruit is not present.  Cardiovascular: Normal rate, regular rhythm and normal heart sounds.   Pulmonary/Chest: Effort normal and breath sounds normal. No respiratory distress. He has no wheezes.  Abdominal: Soft. Bowel sounds are normal. He exhibits no abdominal bruit and no pulsatile midline mass.  Musculoskeletal: Normal range of motion. He exhibits no edema or tenderness.  Neurological: He is alert and oriented to person, place, and time. No sensory deficit.  Skin: Skin is warm and dry.  Psychiatric: He has a normal mood and affect. His behavior is normal. Judgment and thought content normal.    Labs reviewed: Basic Metabolic Panel:  Recent Labs  11/25/15 0153  03/07/16 1503 04/07/16 1130 06/13/16 1214 07/09/16 1154  NA  --   < > 138 141 141 143  K  --   < > 3.1* 4.1 3.1* 3.6  CL  --   < > 100*  --  104 103  CO2  --   < > '30 22 28 29  '$ GLUCOSE  --   < > 211* 173* 98 201*  BUN  --   < > 15 12.'2 18 14  '$ CREATININE  --   < > 1.21 1.6* 1.09 1.3*  CALCIUM  --   < > 8.7* 9.3 8.3* 8.9  MG 2.0  --   --   --   --   --   < > = values in this interval not displayed. Liver Function Tests:  Recent Labs  03/03/16 0927 04/07/16 1130 07/09/16 1154  AST '15 14 18  '$ ALT '17 21 26  '$ ALKPHOS 85* 73 101*  BILITOT 0.90 0.71 0.80  PROT 7.4 7.9 8.2*  ALBUMIN 3.6 3.7 3.7   No results for input(s): LIPASE, AMYLASE in the last 8760 hours. No results for input(s):  AMMONIA in the last 8760 hours. CBC:  Recent Labs  03/07/16 1503 04/07/16 1130 06/13/16 1105 07/09/16 1154  WBC 7.2 8.7 6.6 8.9  NEUTROABS 4.8 6.2  --  6.6*  HGB 12.9* 14.5 13.5 15.1  HCT 41.0 44.4 41.7 46.6  MCV 83.0 82 84.1 84  PLT 273 310 277 298   Lipid Panel: No results for input(s): CHOL, HDL, LDLCALC, TRIG, CHOLHDL, LDLDIRECT in the last 8760 hours. TSH: No results for input(s): TSH in the last 8760 hours. A1C: Lab Results  Component Value Date   HGBA1C 6.8 (H) 02/20/2016     Assessment/Plan 1. Primary osteoarthritis of both knees Following with ortho but still having pain. Knee injections did not offer much benefit. Tramadol helping with pain.  - traMADol (ULTRAM) 50 MG tablet; Take 1 tablet (50 mg total) by mouth every 8 (eight) hours as needed. for pain  Dispense: 90 tablet; Refill: 2  2. Type 2 diabetes mellitus with chronic kidney disease, without long-term current use of insulin, unspecified CKD stage ([place]) -conts on jardiance, metformin and glipizide  - CMP with eGFR - Hemoglobin A1c  3. Hypercoagulable state ([place]) conts on xarelto and following with hematology - CBC with Differential/Platelets  4. Hyperlipidemia with target LDL less than 100 -conts on Lipitor. Encouraged dietary modifications.  - Lipid Panel  5. Essential hypertension Stop lisinopril due  to cough - losartan (COZAAR) 100 MG tablet; Take 1 tablet (100 mg total) by mouth daily.  Dispense: 90 tablet; Refill: 1 -cont on hydralazine, coreg and norvasc   6. Severe obesity (BMI >= 40) (HCC) Discuss need for weight loss, diet modifications and to increase activity.   Next appt: 4 weeks for blood pressure check Okie Jansson K. Harle Battiest  [facility] [phone] 8 am - 5 pm) [phone] (after hours)

## 2016-09-22 NOTE — Patient Instructions (Signed)
Consider PT for knee. Use tramadol for pain and contact ortho if pain persist    DASH Eating Plan DASH stands for "Dietary Approaches to Stop Hypertension." The DASH eating plan is a healthy eating plan that has been shown to reduce high blood pressure (hypertension). It may also reduce your risk for type 2 diabetes, heart disease, and stroke. The DASH eating plan may also help with weight loss. What are tips for following this plan? General guidelines  Avoid eating more than 2,300 mg (milligrams) of salt (sodium) a day. If you have hypertension, you may need to reduce your sodium intake to 1,500 mg a day.  Limit alcohol intake to no more than 1 drink a day for nonpregnant women and 2 drinks a day for men. One drink equals 12 oz of beer, 5 oz of wine, or 1 oz of hard liquor.  Work with your health care provider to maintain a healthy body weight or to lose weight. Ask what an ideal weight is for you.  Get at least 30 minutes of exercise that causes your heart to beat faster (aerobic exercise) most days of the week. Activities may include walking, swimming, or biking.  Work with your health care provider or diet and nutrition specialist (dietitian) to adjust your eating plan to your individual calorie needs. Reading food labels  Check food labels for the amount of sodium per serving. Choose foods with less than 5 percent of the Daily Value of sodium. Generally, foods with less than 300 mg of sodium per serving fit into this eating plan.  To find whole grains, look for the word "whole" as the first word in the ingredient list. Shopping  Buy products labeled as "low-sodium" or "no salt added."  Buy fresh foods. Avoid canned foods and premade or frozen meals. Cooking  Avoid adding salt when cooking. Use salt-free seasonings or herbs instead of table salt or sea salt. Check with your health care provider or pharmacist before using salt substitutes.  Do not fry foods. Cook foods using  healthy methods such as baking, boiling, grilling, and broiling instead.  Cook with heart-healthy oils, such as olive, canola, soybean, or sunflower oil. Meal planning   Eat a balanced diet that includes: ? 5 or more servings of fruits and vegetables each day. At each meal, try to fill half of your plate with fruits and vegetables. ? Up to 6-8 servings of whole grains each day. ? Less than 6 oz of lean meat, poultry, or fish each day. A 3-oz serving of meat is about the same size as a deck of cards. One egg equals 1 oz. ? 2 servings of low-fat dairy each day. ? A serving of nuts, seeds, or beans 5 times each week. ? Heart-healthy fats. Healthy fats called Omega-3 fatty acids are found in foods such as flaxseeds and coldwater fish, like sardines, salmon, and mackerel.  Limit how much you eat of the following: ? Canned or prepackaged foods. ? Food that is high in trans fat, such as fried foods. ? Food that is high in saturated fat, such as fatty meat. ? Sweets, desserts, sugary drinks, and other foods with added sugar. ? Full-fat dairy products.  Do not salt foods before eating.  Try to eat at least 2 vegetarian meals each week.  Eat more home-cooked food and less restaurant, buffet, and fast food.  When eating at a restaurant, ask that your food be prepared with less salt or no salt, if possible. What foods  are recommended? The items listed may not be a complete list. Talk with your dietitian about what dietary choices are best for you. Grains Whole-grain or whole-wheat bread. Whole-grain or whole-wheat pasta. Brown rice. [place] Morrow. Bulgur. Whole-grain and low-sodium cereals. Pita bread. Low-fat, low-sodium crackers. Whole-wheat flour tortillas. Vegetables Fresh or frozen vegetables (raw, steamed, roasted, or grilled). Low-sodium or reduced-sodium tomato and vegetable juice. Low-sodium or reduced-sodium tomato sauce and tomato paste. Low-sodium or reduced-sodium canned  vegetables. Fruits All fresh, dried, or frozen fruit. Canned fruit in natural juice (without added sugar). Meat and other protein foods Skinless chicken or [place]. Ground chicken or [place]. Pork with fat trimmed off. Fish and seafood. Egg whites. Dried beans, peas, or lentils. Unsalted nuts, nut butters, and seeds. Unsalted canned beans. Lean cuts of beef with fat trimmed off. Low-sodium, lean deli meat. Dairy Low-fat (1%) or fat-free (skim) milk. Fat-free, low-fat, or reduced-fat cheeses. Nonfat, low-sodium ricotta or cottage cheese. Low-fat or nonfat yogurt. Low-fat, low-sodium cheese. Fats and oils Soft margarine without trans fats. Vegetable oil. Low-fat, reduced-fat, or light mayonnaise and salad dressings (reduced-sodium). Canola, safflower, olive, soybean, and sunflower oils. Avocado. Seasoning and other foods Herbs. Spices. Seasoning mixes without salt. Unsalted popcorn and pretzels. Fat-free sweets. What foods are not recommended? The items listed may not be a complete list. Talk with your dietitian about what dietary choices are best for you. Grains Baked goods made with fat, such as croissants, muffins, or some breads. Dry pasta or rice meal packs. Vegetables Creamed or fried vegetables. Vegetables in a cheese sauce. Regular canned vegetables (not low-sodium or reduced-sodium). Regular canned tomato sauce and paste (not low-sodium or reduced-sodium). Regular tomato and vegetable juice (not low-sodium or reduced-sodium). Angie Fava. Olives. Fruits Canned fruit in a light or heavy syrup. Fried fruit. Fruit in cream or butter sauce. Meat and other protein foods Fatty cuts of meat. Ribs. Fried meat. Berniece Salines. Sausage. Bologna and other processed lunch meats. Salami. Fatback. Hotdogs. Bratwurst. Salted nuts and seeds. Canned beans with added salt. Canned or smoked fish. Whole eggs or egg yolks. Chicken or [place] with skin. Dairy Whole or 2% milk, cream, and half-and-half. Whole or full-fat  cream cheese. Whole-fat or sweetened yogurt. Full-fat cheese. Nondairy creamers. Whipped toppings. Processed cheese and cheese spreads. Fats and oils Butter. Stick margarine. Lard. Shortening. Ghee. Bacon fat. Tropical oils, such as coconut, palm kernel, or palm oil. Seasoning and other foods Salted popcorn and pretzels. Onion salt, garlic salt, seasoned salt, table salt, and sea salt. Worcestershire sauce. Tartar sauce. Barbecue sauce. Teriyaki sauce. Soy sauce, including reduced-sodium. Steak sauce. Canned and packaged gravies. Fish sauce. Oyster sauce. Cocktail sauce. Horseradish that you find on the shelf. Ketchup. Mustard. Meat flavorings and tenderizers. Bouillon cubes. Hot sauce and Tabasco sauce. Premade or packaged marinades. Premade or packaged taco seasonings. Relishes. Regular salad dressings. Where to find more information:  National Heart, Lung, and [place]: https://wilson-eaton.com/  American Heart Association: www.heart.org Summary  The DASH eating plan is a healthy eating plan that has been shown to reduce high blood pressure (hypertension). It may also reduce your risk for type 2 diabetes, heart disease, and stroke.  With the DASH eating plan, you should limit salt (sodium) intake to 2,300 mg a day. If you have hypertension, you may need to reduce your sodium intake to 1,500 mg a day.  When on the DASH eating plan, aim to eat more fresh fruits and vegetables, whole grains, lean proteins, low-fat dairy, and heart-healthy fats.  Work with your  health care provider or diet and nutrition specialist (dietitian) to adjust your eating plan to your individual calorie needs. This information is not intended to replace advice given to you by your health care provider. Make sure you discuss any questions you have with your health care provider. Document Released: 01/22/2011 Document Revised: 01/27/2016 Document Reviewed: 01/27/2016 Elsevier Interactive Patient Education  2017 [employer].

## 2016-09-23 LAB — HEMOGLOBIN A1C
Hgb A1c MFr Bld: 9.2 % — ABNORMAL HIGH (ref <5.7)
Mean Plasma Glucose: 217 mg/dL

## 2016-10-08 ENCOUNTER — Encounter (HOSPITAL_COMMUNITY): Payer: Self-pay | Admitting: Emergency Medicine

## 2016-10-08 DIAGNOSIS — E1122 Type 2 diabetes mellitus with diabetic chronic kidney disease: Secondary | ICD-10-CM | POA: Insufficient documentation

## 2016-10-08 DIAGNOSIS — Z79891 Long term (current) use of opiate analgesic: Secondary | ICD-10-CM | POA: Insufficient documentation

## 2016-10-08 DIAGNOSIS — S83241A Other tear of medial meniscus, current injury, right knee, initial encounter: Principal | ICD-10-CM | POA: Insufficient documentation

## 2016-10-08 DIAGNOSIS — E876 Hypokalemia: Secondary | ICD-10-CM | POA: Insufficient documentation

## 2016-10-08 DIAGNOSIS — Z7984 Long term (current) use of oral hypoglycemic drugs: Secondary | ICD-10-CM | POA: Insufficient documentation

## 2016-10-08 DIAGNOSIS — Z7901 Long term (current) use of anticoagulants: Secondary | ICD-10-CM | POA: Insufficient documentation

## 2016-10-08 DIAGNOSIS — K219 Gastro-esophageal reflux disease without esophagitis: Secondary | ICD-10-CM | POA: Insufficient documentation

## 2016-10-08 DIAGNOSIS — Z7409 Other reduced mobility: Secondary | ICD-10-CM | POA: Diagnosis not present

## 2016-10-08 DIAGNOSIS — M1711 Unilateral primary osteoarthritis, right knee: Secondary | ICD-10-CM | POA: Diagnosis not present

## 2016-10-08 DIAGNOSIS — W19XXXA Unspecified fall, initial encounter: Secondary | ICD-10-CM | POA: Diagnosis not present

## 2016-10-08 DIAGNOSIS — Z79899 Other long term (current) drug therapy: Secondary | ICD-10-CM | POA: Diagnosis not present

## 2016-10-08 DIAGNOSIS — Z6841 Body Mass Index (BMI) 40.0 and over, adult: Secondary | ICD-10-CM | POA: Insufficient documentation

## 2016-10-08 DIAGNOSIS — N189 Chronic kidney disease, unspecified: Secondary | ICD-10-CM | POA: Diagnosis not present

## 2016-10-08 DIAGNOSIS — Z7982 Long term (current) use of aspirin: Secondary | ICD-10-CM | POA: Diagnosis not present

## 2016-10-08 DIAGNOSIS — I129 Hypertensive chronic kidney disease with stage 1 through stage 4 chronic kidney disease, or unspecified chronic kidney disease: Secondary | ICD-10-CM | POA: Diagnosis not present

## 2016-10-08 DIAGNOSIS — M25562 Pain in left knee: Secondary | ICD-10-CM | POA: Insufficient documentation

## 2016-10-08 DIAGNOSIS — Z86718 Personal history of other venous thrombosis and embolism: Secondary | ICD-10-CM | POA: Insufficient documentation

## 2016-10-08 DIAGNOSIS — S8992XA Unspecified injury of left lower leg, initial encounter: Secondary | ICD-10-CM | POA: Diagnosis not present

## 2016-10-08 DIAGNOSIS — E785 Hyperlipidemia, unspecified: Secondary | ICD-10-CM | POA: Insufficient documentation

## 2016-10-08 DIAGNOSIS — M25561 Pain in right knee: Secondary | ICD-10-CM | POA: Diagnosis not present

## 2016-10-08 DIAGNOSIS — Z888 Allergy status to other drugs, medicaments and biological substances status: Secondary | ICD-10-CM | POA: Insufficient documentation

## 2016-10-08 DIAGNOSIS — S86191A Other injury of other muscle(s) and tendon(s) of posterior muscle group at lower leg level, right leg, initial encounter: Secondary | ICD-10-CM | POA: Diagnosis not present

## 2016-10-08 NOTE — ED Triage Notes (Signed)
Unable to stand without severe pain. Bilateral knees swollen.

## 2016-10-08 NOTE — ED Triage Notes (Signed)
Patient arrives via EMS with complaint of bilateral knee pain after a fall today. States long history of knee problems with reconstructive surgery decades ago. Explains that couple weeks ago he had another fall when playing with grandchildren. Today his knees simply gave out while walking and he landed on them. Also complaining of some lower back pain.

## 2016-10-09 ENCOUNTER — Emergency Department (HOSPITAL_COMMUNITY): Payer: Medicare Other

## 2016-10-09 ENCOUNTER — Observation Stay (HOSPITAL_COMMUNITY)
Admission: EM | Admit: 2016-10-09 | Discharge: 2016-10-11 | Disposition: A | Discharge status: Home / Self Care | Payer: Medicare Other | Attending: Internal Medicine | Admitting: Internal Medicine

## 2016-10-09 DIAGNOSIS — E1122 Type 2 diabetes mellitus with diabetic chronic kidney disease: Secondary | ICD-10-CM | POA: Diagnosis present

## 2016-10-09 DIAGNOSIS — M179 Osteoarthritis of knee, unspecified: Secondary | ICD-10-CM | POA: Diagnosis present

## 2016-10-09 DIAGNOSIS — I10 Essential (primary) hypertension: Secondary | ICD-10-CM | POA: Diagnosis present

## 2016-10-09 DIAGNOSIS — E785 Hyperlipidemia, unspecified: Secondary | ICD-10-CM | POA: Diagnosis present

## 2016-10-09 DIAGNOSIS — I2699 Other pulmonary embolism without acute cor pulmonale: Secondary | ICD-10-CM | POA: Diagnosis present

## 2016-10-09 DIAGNOSIS — S83241A Other tear of medial meniscus, current injury, right knee, initial encounter: Secondary | ICD-10-CM | POA: Diagnosis not present

## 2016-10-09 DIAGNOSIS — S838X1A Sprain of other specified parts of right knee, initial encounter: Secondary | ICD-10-CM

## 2016-10-09 DIAGNOSIS — S838X9A Sprain of other specified parts of unspecified knee, initial encounter: Secondary | ICD-10-CM | POA: Diagnosis present

## 2016-10-09 DIAGNOSIS — M25561 Pain in right knee: Secondary | ICD-10-CM | POA: Diagnosis not present

## 2016-10-09 DIAGNOSIS — W19XXXA Unspecified fall, initial encounter: Secondary | ICD-10-CM | POA: Diagnosis not present

## 2016-10-09 DIAGNOSIS — M1711 Unilateral primary osteoarthritis, right knee: Secondary | ICD-10-CM

## 2016-10-09 DIAGNOSIS — S8992XA Unspecified injury of left lower leg, initial encounter: Secondary | ICD-10-CM | POA: Diagnosis not present

## 2016-10-09 DIAGNOSIS — M1710 Unilateral primary osteoarthritis, unspecified knee: Secondary | ICD-10-CM | POA: Diagnosis present

## 2016-10-09 DIAGNOSIS — M25562 Pain in left knee: Secondary | ICD-10-CM | POA: Diagnosis not present

## 2016-10-09 DIAGNOSIS — M170 Bilateral primary osteoarthritis of knee: Secondary | ICD-10-CM

## 2016-10-09 LAB — CBC
HCT: 42 % (ref 39.0–52.0)
Hemoglobin: 13.6 g/dL (ref 13.0–17.0)
MCH: 26.7 pg (ref 26.0–34.0)
MCHC: 32.4 g/dL (ref 30.0–36.0)
MCV: 82.4 fL (ref 78.0–100.0)
Platelets: 248 10*3/uL (ref 150–400)
RBC: 5.1 MIL/uL (ref 4.22–5.81)
RDW: 15.1 % (ref 11.5–15.5)
WBC: 10.4 10*3/uL (ref 4.0–10.5)

## 2016-10-09 LAB — BASIC METABOLIC PANEL
Anion gap: 11 (ref 5–15)
BUN: 15 mg/dL (ref 6–20)
CO2: 27 mmol/L (ref 22–32)
Calcium: 9 mg/dL (ref 8.9–10.3)
Chloride: 103 mmol/L (ref 101–111)
Creatinine, Ser: 1.08 mg/dL (ref 0.61–1.24)
GFR calc Af Amer: >60 mL/min (ref >60)
GFR calc non Af Amer: >60 mL/min (ref >60)
Glucose, Bld: 270 mg/dL — ABNORMAL HIGH (ref 65–99)
Potassium: 3.5 mmol/L (ref 3.5–5.1)
Sodium: 141 mmol/L (ref 135–145)

## 2016-10-09 LAB — CBG MONITORING, ED: Glucose-Capillary: 285 mg/dL — ABNORMAL HIGH (ref 65–99)

## 2016-10-09 LAB — HEMOGLOBIN A1C
Hgb A1c MFr Bld: 8.9 % — ABNORMAL HIGH (ref 4.8–5.6)
Mean Plasma Glucose: 208.73 mg/dL

## 2016-10-09 LAB — GLUCOSE, CAPILLARY
Glucose-Capillary: 333 mg/dL — ABNORMAL HIGH (ref 65–99)
Glucose-Capillary: 233 mg/dL — ABNORMAL HIGH (ref 65–99)

## 2016-10-09 MED ORDER — NAPROXEN 250 MG PO TABS
500.0000 mg | ORAL_TABLET | Freq: Two times a day (BID) | ORAL | Status: DC
  Administered 2016-10-09 – 2016-10-10 (×3): 500 mg via ORAL
  Filled 2016-10-09 (×3): qty 2

## 2016-10-09 MED ORDER — ASPIRIN EC 81 MG PO TBEC
81.0000 mg | DELAYED_RELEASE_TABLET | Freq: Every day | ORAL | Status: DC
  Administered 2016-10-09 – 2016-10-11 (×3): 81 mg via ORAL
  Filled 2016-10-09 (×3): qty 1

## 2016-10-09 MED ORDER — OXYCODONE-ACETAMINOPHEN 5-325 MG PO TABS: 1.0000 | ORAL_TABLET | Freq: Four times a day (QID) | ORAL | 0 refills | Status: DC | PRN

## 2016-10-09 MED ORDER — POTASSIUM CHLORIDE CRYS ER 20 MEQ PO TBCR
20.0000 meq | EXTENDED_RELEASE_TABLET | Freq: Two times a day (BID) | ORAL | Status: DC
  Administered 2016-10-09 – 2016-10-11 (×4): 20 meq via ORAL
  Filled 2016-10-09 (×4): qty 1

## 2016-10-09 MED ORDER — HYDROMORPHONE HCL 1 MG/ML IJ SOLN
1.0000 mg | Freq: Once | INTRAMUSCULAR | Status: AC
  Administered 2016-10-09: 1 mg via INTRAVENOUS
  Filled 2016-10-09: qty 1

## 2016-10-09 MED ORDER — RIVAROXABAN 20 MG PO TABS
20.0000 mg | ORAL_TABLET | Freq: Every day | ORAL | Status: DC
  Administered 2016-10-09 – 2016-10-10 (×2): 20 mg via ORAL
  Filled 2016-10-09 (×3): qty 1

## 2016-10-09 MED ORDER — AMLODIPINE BESYLATE 10 MG PO TABS
10.0000 mg | ORAL_TABLET | Freq: Every evening | ORAL | Status: DC
  Administered 2016-10-09 – 2016-10-10 (×2): 10 mg via ORAL
  Filled 2016-10-09 (×2): qty 1

## 2016-10-09 MED ORDER — PANTOPRAZOLE SODIUM 40 MG PO TBEC
40.0000 mg | DELAYED_RELEASE_TABLET | Freq: Every day | ORAL | Status: DC
  Administered 2016-10-09 – 2016-10-11 (×3): 40 mg via ORAL
  Filled 2016-10-09 (×3): qty 1

## 2016-10-09 MED ORDER — INSULIN ASPART 100 UNIT/ML ~~LOC~~ SOLN
0.0000 [IU] | Freq: Three times a day (TID) | SUBCUTANEOUS | Status: DC
  Administered 2016-10-09: 11 [IU] via SUBCUTANEOUS
  Administered 2016-10-10: 3 [IU] via SUBCUTANEOUS
  Administered 2016-10-10 – 2016-10-11 (×3): 5 [IU] via SUBCUTANEOUS

## 2016-10-09 MED ORDER — CARVEDILOL 6.25 MG PO TABS
6.2500 mg | ORAL_TABLET | Freq: Two times a day (BID) | ORAL | Status: DC
  Administered 2016-10-09 – 2016-10-11 (×5): 6.25 mg via ORAL
  Filled 2016-10-09 (×5): qty 1

## 2016-10-09 MED ORDER — DICLOFENAC SODIUM 1 % TD GEL
2.0000 g | Freq: Four times a day (QID) | TRANSDERMAL | Status: DC | PRN
  Filled 2016-10-09: qty 100

## 2016-10-09 MED ORDER — HYDRALAZINE HCL 25 MG PO TABS
25.0000 mg | ORAL_TABLET | Freq: Three times a day (TID) | ORAL | Status: DC
  Administered 2016-10-09 – 2016-10-11 (×7): 25 mg via ORAL
  Filled 2016-10-09 (×7): qty 1

## 2016-10-09 MED ORDER — LOSARTAN POTASSIUM 50 MG PO TABS
100.0000 mg | ORAL_TABLET | Freq: Every day | ORAL | Status: DC
  Administered 2016-10-09 – 2016-10-11 (×3): 100 mg via ORAL
  Filled 2016-10-09 (×3): qty 2

## 2016-10-09 MED ORDER — OXYCODONE HCL 5 MG PO TABS
5.0000 mg | ORAL_TABLET | ORAL | Status: DC | PRN
  Administered 2016-10-09 – 2016-10-10 (×4): 5 mg via ORAL
  Filled 2016-10-09 (×4): qty 1

## 2016-10-09 MED ORDER — FENTANYL CITRATE (PF) 100 MCG/2ML IJ SOLN
100.0000 ug | Freq: Once | INTRAMUSCULAR | Status: AC
  Administered 2016-10-09: 100 ug via INTRAVENOUS
  Filled 2016-10-09: qty 2

## 2016-10-09 MED ORDER — ATORVASTATIN CALCIUM 10 MG PO TABS
10.0000 mg | ORAL_TABLET | Freq: Every day | ORAL | Status: DC
  Administered 2016-10-09 – 2016-10-10 (×2): 10 mg via ORAL
  Filled 2016-10-09 (×3): qty 1

## 2016-10-09 MED ORDER — OXYCODONE-ACETAMINOPHEN 5-325 MG PO TABS
1.0000 | ORAL_TABLET | Freq: Once | ORAL | Status: AC
  Administered 2016-10-09: 1 via ORAL
  Filled 2016-10-09: qty 1

## 2016-10-09 MED ORDER — OXYCODONE-ACETAMINOPHEN 5-325 MG PO TABS
1.0000 | ORAL_TABLET | ORAL | Status: DC | PRN
  Administered 2016-10-09: 1 via ORAL

## 2016-10-09 MED ORDER — OXYCODONE-ACETAMINOPHEN 5-325 MG PO TABS
2.0000 | ORAL_TABLET | Freq: Once | ORAL | Status: AC
  Administered 2016-10-09: 2 via ORAL
  Filled 2016-10-09: qty 2

## 2016-10-09 MED ORDER — INSULIN ASPART 100 UNIT/ML ~~LOC~~ SOLN
0.0000 [IU] | Freq: Every day | SUBCUTANEOUS | Status: DC
  Administered 2016-10-09 – 2016-10-10 (×2): 2 [IU] via SUBCUTANEOUS

## 2016-10-09 MED ORDER — TRAMADOL HCL 50 MG PO TABS
50.0000 mg | ORAL_TABLET | Freq: Three times a day (TID) | ORAL | Status: DC | PRN
  Administered 2016-10-10 – 2016-10-11 (×2): 50 mg via ORAL
  Filled 2016-10-09 (×2): qty 1

## 2016-10-09 MED ORDER — OXYCODONE-ACETAMINOPHEN 5-325 MG PO TABS
ORAL_TABLET | ORAL | Status: AC
Start: 2016-10-09 — End: 2016-10-09
  Filled 2016-10-09: qty 1

## 2016-10-09 MED ORDER — OXYCODONE-ACETAMINOPHEN 5-325 MG PO TABS
1.0000 | ORAL_TABLET | ORAL | Status: DC | PRN
  Administered 2016-10-09 – 2016-10-10 (×3): 2 via ORAL
  Filled 2016-10-09 (×3): qty 2

## 2016-10-09 NOTE — ED Notes (Signed)
Bilateral ace wraps applied to both knees

## 2016-10-09 NOTE — ED Provider Notes (Addendum)
[facility] DEPT Provider Note   CSN: [id number] Arrival date & time: 10/08/16  2339     History   Chief Complaint Chief Complaint  Patient presents with  . Fall  . Knee Pain    HPI Thomas [name] is a 66 y.o. male.  HPI 66 year old male with morbid obesity, diabetes, chronic kidney disease presenting with bilateral knee pain. Patient reports that "gave out earlier". He reports a porch and walking to the bathroom and then having him "give out". No trauma to the knees. Patient sees Dr.Tu from orthopedics. He reports he is alreadu had knee surgery and left knee. He reports cortisone injection on the right. Patient is hoping to get an MRI here in the emergency department.  Patient on Xarelto for DVT and PE. Past Medical History:  Diagnosis Date  . Arthritis   . Cancer [facility]) 2010   Prostate  . Chronic kidney disease   . Diabetes mellitus   . DVT (deep vein thrombosis) in pregnancy ([place])   . Genetic testing 06/22/2016   Mr. Minion underwent genetic counseling and testing for hereditary cancer syndromes on 05/14/2016. His results were negative for mutations in all 46 genes analyzed by [facility]'s 46-gene Common Hereditary Cancers Panel. Genes analyzed include: APC, ATM, AXIN2, BARD1, BMPR1A, BRCA1, BRCA2, BRIP1, CDH1, CDKN2A, CHEK2, CTNNA1, DICER1, EPCAM, GREM1, HOXB13, KIT, MEN1, MLH1, MSH2, MSH3, MSH6, MUTYH, NB  . Hypertension   . PE (pulmonary thromboembolism) [facility])     Patient Active Problem List   Diagnosis Date Noted  . Hypercoagulable state ([place]) 09/22/2016  . Genetic testing 06/22/2016  . DVT, lower extremity, proximal, acute, left ([place]) 01/23/2016  . Hematuria 12/12/2015  . Pulmonary embolism ([place]) 11/23/2015  . Type 2 diabetes mellitus with chronic kidney disease, without long-term current use of insulin ([place]) 12/05/2014  . Benign prostatic hyperplasia with urinary obstruction 07/25/2014  . Urine retention 07/25/2014  . Urinary tract infection 07/09/2014  .  Anemia, iron deficiency 11/22/2013  . Urinary retention 11/01/2013  . Incomplete emptying of bladder 07/26/2013  . Nocturia 07/26/2013  . Other nonspecific finding on examination of urine 07/26/2013  . Sleep apnea 07/26/2013  . Urethral stricture 07/26/2013  . Malignant neoplasm of prostate ([place]) 07/04/2013  . Bladder neck contracture 07/04/2013  . Severe obesity (BMI >= 40) ([place]) 05/23/2013  . B12 deficiency 05/23/2013  . Hyperlipidemia with target LDL less than 100 09/02/2012  . HTN (hypertension) 05/26/2012  . Osteoarthritis, knee 05/26/2012  . MORBID OBESITY 08/22/2008    Past Surgical History:  Procedure Laterality Date  . HERNIA REPAIR    . KNEE SURGERY    . PROSTATE SURGERY    . RADIOACTIVE SEED IMPLANT    . SHOULDER SURGERY    . uretha surgery-2014         Home Medications    Prior to Admission medications   Medication Sig Start Date End Date Taking? Authorizing Provider  amLODipine (NORVASC) 10 MG tablet Take 1 tablet (10 mg total) by mouth daily. For high blood pressure 05/21/16   [name], NP  atorvastatin (LIPITOR) 10 MG tablet Take one tablet by mouth once daily for cholesterol 09/30/15   [name], NP  carvedilol (COREG) 6.25 MG tablet Take 1 tablet (6.25 mg total) by mouth 2 (two) times daily with a meal. For high blood pressure 01/03/16   [name], NP  diclofenac sodium (VOLTAREN) 1 % GEL Apply 2 g topically 4 (four) times daily. 08/21/16   Comer Locket, PA-C  empagliflozin (JARDIANCE) 10 MG TABS tablet Take 10 mg by mouth daily. 05/05/16   [name], NP  glipiZIDE (GLUCOTROL) 5 MG tablet Take 1 tablet (5 mg total) by mouth daily before breakfast. 01/03/16   [name], NP  hydrALAZINE (APRESOLINE) 25 MG tablet Take 1 tablet (25 mg total) by mouth 3 (three) times daily. 02/20/16   [name], NP  losartan (COZAAR) 100 MG tablet Take 1 tablet (100 mg total) by mouth daily. 09/22/16   [name], NP    metFORMIN (GLUCOPHAGE) 500 MG tablet TAKE 1 TABLET BY MOUTH 2 TIMES A DAY WITH A MEAL for diabetes 01/21/16   Estill Dooms, MD  omeprazole (PRILOSEC) 20 MG capsule Take 1 capsule (20 mg total) by mouth as needed. 05/24/14   [name], NP  potassium chloride SA (K-DUR,KLOR-CON) 20 MEQ tablet Take 20 mEq by mouth 2 (two) times daily.    [provider]  rivaroxaban (XARELTO) 20 MG TABS tablet Take 1 tablet (20 mg total) by mouth daily with supper. 01/23/16   Volanda Napoleon, MD  traMADol (ULTRAM) 50 MG tablet Take 1 tablet (50 mg total) by mouth every 8 (eight) hours as needed. for pain 09/22/16   [name], NP  TRUE METRIX BLOOD GLUCOSE TEST test strip CHECK BLOOD SUGAR TWICE PER DAY 10/24/15   [name], NP  TRUEPLUS LANCETS 30G MISC USE TO TEST BLOOD SUGAR 2 TIMES A DAY 09/12/14   [name], NP    Family History Family History  Problem Relation Age of Onset  . Breast cancer Mother 17       d.89  . Breast cancer Sister 66       d.30  . Leukemia Brother 18       d.20  . Breast cancer Maternal Aunt 40       d.40s  . Lung cancer Maternal Uncle   . Prostate cancer Paternal Uncle   . Prostate cancer Brother        recurred recently at age 14  . Other Brother 15       spinal tumor  . Cervical cancer Other 22       d.22  . Cancer Sister 17       unspecified type  . Cancer Maternal Uncle 31       unspecified type    Social History Social History  Substance Use Topics  . Smoking status: Never Smoker  . Smokeless tobacco: Never Used  . Alcohol use No     Comment: never     Allergies   Lisinopril   Review of Systems Review of Systems  Constitutional: Negative for activity change.  Respiratory: Negative for shortness of breath.   Cardiovascular: Negative for chest pain.  Gastrointestinal: Negative for abdominal pain.     Physical Exam Updated Vital Signs BP (!) 202/90   Pulse 75   Temp 97.9 F (36.6 C) (Oral)   Resp 18    SpO2 97%   Physical Exam  Constitutional: He is oriented to person, place, and time. He appears well-nourished.  HENT:  Head: Normocephalic.  Eyes: Conjunctivae are normal.  Cardiovascular: Normal rate.   Pulmonary/Chest: Effort normal.  Musculoskeletal:  Difficulty to identify underlying knee structures given body habitus. Patient may have some swelling to the knees. No erythema no warmth. Patient has good range of motion. Patient does not have any instability of the joint noted.  Neurological: He is oriented  to person, place, and time.  Skin: Skin is warm and dry. He is not diaphoretic.  Psychiatric: He has a normal mood and affect. His behavior is normal.     ED Treatments / Results  Labs (all labs ordered are listed, but only abnormal results are displayed) Labs Reviewed - No data to display  EKG  EKG Interpretation None       Radiology Dg Knee Complete 4 Views Left  Result Date: 10/09/2016 CLINICAL DATA:  Bilateral knee pain after fall today. EXAM: LEFT KNEE - COMPLETE 4+ VIEW COMPARISON:  None. FINDINGS: No acute fracture or dislocation. Sequela of prior ACL repair. Moderate tricompartmental osteoarthritis, prominent in the medial tibiofemoral patellofemoral compartments. Moderate joint effusion. Probable ossified intra-articular bodies posteriorly in the knee. IMPRESSION: 1. No acute fracture or subluxation of the left knee. 2. Postsurgical change with moderate osteoarthritis, joint effusion and probable intra-articular bodies posteriorly. Electronically Signed   By: Jeb Levering M.D.   On: 10/09/2016 00:38   Dg Knee Complete 4 Views Right  Result Date: 10/09/2016 CLINICAL DATA:  Bilateral knee pain after fall today. EXAM: RIGHT KNEE - COMPLETE 4+ VIEW COMPARISON:  Radiographs 08/21/2016 FINDINGS: No acute fracture or dislocation. Advanced tricompartmental osteoarthritis, unchanged from prior exam. Moderate knee joint effusion has increased. IMPRESSION: 1. No acute  fracture or dislocation of the right knee pain. Two advanced tricompartmental osteoarthritis. Knee joint effusion which has increased from prior exam. Electronically Signed   By: Jeb Levering M.D.   On: 10/09/2016 00:39    Procedures Procedures (including critical care time)  Medications Ordered in ED Medications  oxyCODONE-acetaminophen (PERCOCET/ROXICET) 5-325 MG per tablet 1 tablet (1 tablet Oral Given 10/09/16 0213)  oxyCODONE-acetaminophen (PERCOCET/ROXICET) 5-325 MG per tablet 1 tablet (not administered)     Initial Impression / Assessment and Plan / ED Course  I have reviewed the triage vital signs and the nursing notes.  Pertinent labs & imaging results that were available during my care of the patient were reviewed by me and considered in my medical decision making (see chart for details).     66 year old male with morbid obesity, diabetes, chronic kidney disease presenting with bilateral knee pain. Patient reports that "gave out earlier". He reports a porch and walking to the bathroom and then having him "give out". Pt fell to his knees. Patient sees Dr.Tu from orthopedics. He reports he is alreadu had knee surgery and left knee and has known fluid on knees.  He reports cortisone injection on the right recently.. Patient is hoping to get an MRI here in the emergency department. He reports recent CT on right knee from previous fall in the last couple months.   Patient on Xarelto for DVT and PE.  5:27 AM Given reassuring exam, I do not see a need for emergent MRI unless unable to bear weight. Will given pain medication as well. (patient only received one percocet at triage, given his weight, we will try appropriate medication)  We'll have him follow-up with his primary/orthopedist to get an MRI as an outpatient. Do not suspect septic arthritis. Do not suspect hemarthrosis. We'll attempt ambulation. Plan to discharge when ambulating.   7:19 AM Attempted ambulation with  bilateral Ace bandages. Patient unable to ambulate. Patient's son now in the room. I discussed plan with patient, daughter on the phone, and son. Patient's son yelling angrily, making it difficult to communicate with both the patient and the daughter.  Son angry because MRI not done prior to now. I attempted  to explain, but son became increasingly angry. Pt daughter asked patient son to calm down. Nursing present throughout interaction. Apologized for wait and length of stay.   MRI ordered and increased pain medication ordered. Will sign out to oncoming colleague.   Final Clinical Impressions(s) / ED Diagnoses   Final diagnoses:  None    New Prescriptions New Prescriptions   No medications on file     [name], MD 10/09/16 [id number]      [name], MD 10/09/16 0730

## 2016-10-09 NOTE — ED Notes (Signed)
Patient returned to room place back on monitor.

## 2016-10-09 NOTE — ED Notes (Signed)
Pt reports "I need you to put in a foley because I can not control my urine". This Probation officer and previous nurse informed patient that there must be an order to place a foley. Will notify admitting about patients request.

## 2016-10-09 NOTE — ED Notes (Signed)
Pt states he wants to wait for his son to arrive as he was unable to use a catheter while waiting in the lobby and soaked through his depends. Son is bringing him new briefs and pants. Pt did state he walked to the sink. Pt only wants his son to help him change and declined assistance from [facility] and this EMT.

## 2016-10-09 NOTE — ED Provider Notes (Signed)
1:24 PM Assumed care from Dr. Thomasene Lot, please see their note for full history, physical and decision making until this point. In brief this is a 66 y.o. year old male who presented to the ED tonight with Fall and Knee Pain     A 66 year old male that was checked out to me secondary to inability to walk because knee pain. Pending MRI. MRI shows ligamentous damage in his right knee is likely cause for his pain. We'll attempt crutches and knee immobilizer High blood pressure treated with home meds this seems to have improved. Patient not able ambulate even with a knee immobilizer and crutches and 4 doses of pain medication. I don't feel this patient is safe to go home at this time and likely need some physical therapy evaluation and to be worked with and will discuss with medicine for admission.  Labs, studies and imaging reviewed by myself and considered in medical decision making if ordered. Imaging interpreted by radiology.  Labs Reviewed - No data to display  MR KNEE RIGHT WO CONTRAST  Final Result    DG Knee Complete 4 Views Right  Final Result    DG Knee Complete 4 Views Left  Final Result      No Follow-up on file.    Shayann Garbutt, Corene Cornea, MD 10/11/16 [phone]

## 2016-10-09 NOTE — ED Notes (Signed)
Patient given two blankets.

## 2016-10-09 NOTE — ED Notes (Signed)
Patient still in MRI.  

## 2016-10-09 NOTE — ED Notes (Signed)
RN Brook informed of pt BP

## 2016-10-09 NOTE — ED Notes (Signed)
Patient still off the floor for scan. 

## 2016-10-09 NOTE — ED Notes (Signed)
Admitting at bedside,  

## 2016-10-09 NOTE — ED Notes (Signed)
Florene Glen MD advises for condom cath and reports he will place order.

## 2016-10-09 NOTE — ED Notes (Signed)
Pt's son to nurses desk asking this EMT why we believe it is a good idea to have his father try to walk when he can't put any pressure on his legs. Son expressed his frustrations at the fact that the x-ray his father received would not show any ligament damage. Continued to raise voice at the desk and said he thinks the reason an MRI wasn't ordered was "an insurance thing". This EMT told him I would notify the RN of his concerns.

## 2016-10-09 NOTE — ED Notes (Signed)
This RN accompanied MD into room to speak with patient and son. Son became verbally aggressive and belligerent towards MD, Pt had daughter on speaker phone and daughter was reasonable and able to talk with MD. Pandora Leiter was consistently yelling and interrupting MD regarding why Pt had not received further tests. Pt's son accused MD of being condescending and uncaring when she was trying to provide care to the pt. Son remained rude with voice raised throughout conversation with MD even while pt and pt's daughter was trying to calm him down.

## 2016-10-09 NOTE — ED Notes (Signed)
Pt states most pain to right knee- sts right knee feels weaker then the left when trying to stand

## 2016-10-09 NOTE — Progress Notes (Signed)
Orthopedic Tech Progress Note Patient Details:  Thomas [name] Jun 02, 1950 [id number]  Ortho Devices Type of Ortho Device: Knee Immobilizer Ortho Device/Splint Location: rle Ortho Device/Splint Interventions: Application   Thomas [name] 10/09/2016, 1:24 PM Pt unable to use crutches; RN and DOCTOR NOTIFIED

## 2016-10-09 NOTE — ED Notes (Signed)
Md aware of patient BP .

## 2016-10-09 NOTE — H&P (Signed)
History and Physical    Thomas [name] MWN:[id number] DOB: [birth year] DOA: 10/09/2016  PCP: [name], NP (Confirm with patient/family/NH records and if not entered, this has to be entered at [facility] point of entry) Patient coming from: home  I have personally briefly reviewed patient's old medical records in [place]  Chief Complaint: fall, knee pain  HPI: Thomas [name] is Thomas [name] 66 y.o. male with medical history significant of arthritis, chronic kidney disease, type 2 diabetes, venous thrombus embolism, and hypertension who presents after Thomas [name] fall. He also he's been having some problems with his right knee over the last few months. He denies any locking or catching. He notes that yesterday he fell onto his right knee at the gym. He notes that it just gave way. No chest pain no shortness of breath no lightheadedness or dizziness. No fevers or chills. He presented the ED yesterday and was found to have Thomas [name] tear of the medial meniscus and severe tricompartmental degenerative changes. He was unable to walk due to pain and was called up for admission.   ED Course: NAD and imaging with plain films as well as MRI as noted above. They attempted crutches and knee immobilizer. He was not able to ambulate and was called for admission for physical therapy evaluation. Also call.  Review of Systems: As per HPI otherwise 10 point review of systems negative.   Past Medical History:  Diagnosis Date  . Arthritis   . Cancer [facility]) 2010   Prostate  . Chronic kidney disease   . Diabetes mellitus   . DVT (deep vein thrombosis) in pregnancy ([place])   . Genetic testing 06/22/2016   Thomas [name] underwent genetic counseling and testing for hereditary cancer syndromes on 05/14/2016. His results were negative for mutations in all 46 genes analyzed by [facility]'s 46-gene Common Hereditary Cancers Panel. Genes analyzed include: APC, ATM, AXIN2, BARD1, BMPR1A, BRCA1, BRCA2, BRIP1, CDH1, CDKN2A, CHEK2,  CTNNA1, DICER1, EPCAM, GREM1, HOXB13, KIT, MEN1, MLH1, MSH2, MSH3, MSH6, MUTYH, NB  . Hypertension   . PE (pulmonary thromboembolism) ([place])     Past Surgical History:  Procedure Laterality Date  . HERNIA REPAIR    . KNEE SURGERY    . PROSTATE SURGERY    . RADIOACTIVE SEED IMPLANT    . SHOULDER SURGERY    . Thomas [name] surgery-2014       reports that he has never smoked. He has never used smokeless tobacco. He reports that he does not drink alcohol or use drugs.  Allergies  Allergen Reactions  . Lisinopril Cough    Family History  Problem Relation Age of Onset  . Breast cancer Mother 34       d.89  . Breast cancer Sister 70       d.30  . Leukemia Brother 18       d.20  . Breast cancer Maternal Aunt 40       d.40s  . Lung cancer Maternal Uncle   . Prostate cancer Paternal Uncle   . Prostate cancer Brother        recurred recently at age 5  . Other Brother 15       spinal tumor  . Cervical cancer Other 22       d.22  . Cancer Sister 14       unspecified type  . Cancer Maternal Uncle 80       unspecified type    Prior to Admission medications   Medication Sig Start  Date End Date Taking? Authorizing Provider  amLODipine (NORVASC) 10 MG tablet Take 1 tablet (10 mg total) by mouth daily. For high blood pressure Patient taking differently: Take 10 mg by mouth every evening. For high blood pressure 05/21/16  Yes [name], NP  aspirin EC 81 MG tablet Take 81 mg by mouth daily.   Yes [provider]  atorvastatin (LIPITOR) 10 MG tablet Take one tablet by mouth once daily for cholesterol 09/30/15  Yes [name], NP  carvedilol (COREG) 6.25 MG tablet Take 1 tablet (6.25 mg total) by mouth 2 (two) times daily with Thomas [name] meal. For high blood pressure 01/03/16  Yes [name], NP  diclofenac sodium (VOLTAREN) 1 % GEL Apply 2 g topically 4 (four) times daily. Patient taking differently: Apply 2 g topically 4 (four) times daily as needed (for pain).   08/21/16  Yes Cartner, Thomas Kitchen, PA-C  empagliflozin (JARDIANCE) 10 MG TABS tablet Take 10 mg by mouth daily. 05/05/16  Yes [name], NP  glipiZIDE (GLUCOTROL) 5 MG tablet Take 1 tablet (5 mg total) by mouth daily before breakfast. 01/03/16  Yes [name], NP  hydrALAZINE (APRESOLINE) 25 MG tablet Take 1 tablet (25 mg total) by mouth 3 (three) times daily. 02/20/16  Yes [name], NP  losartan (COZAAR) 100 MG tablet Take 1 tablet (100 mg total) by mouth daily. 09/22/16  Yes [name], NP  metFORMIN (GLUCOPHAGE) 500 MG tablet TAKE 1 TABLET BY MOUTH 2 TIMES Thomas [name] DAY WITH Thomas [name] MEAL for diabetes 01/21/16  Yes Estill Dooms, MD  omeprazole (PRILOSEC) 20 MG capsule Take 1 capsule (20 mg total) by mouth as needed. 05/24/14  Yes [name], NP  potassium chloride SA (K-DUR,KLOR-CON) 20 MEQ tablet Take 20 mEq by mouth 2 (two) times daily.   Yes [provider]  rivaroxaban (XARELTO) 20 MG TABS tablet Take 1 tablet (20 mg total) by mouth daily with supper. 01/23/16  Yes Ennever, Rudell Cobb, MD  traMADol (ULTRAM) 50 MG tablet Take 1 tablet (50 mg total) by mouth every 8 (eight) hours as needed. for pain 09/22/16  Yes [name], NP  TRUE METRIX BLOOD GLUCOSE TEST test strip CHECK BLOOD SUGAR TWICE PER DAY 10/24/15  Yes [name], NP  TRUEPLUS LANCETS 30G MISC USE TO TEST BLOOD SUGAR 2 TIMES [name] DAY 09/12/14  Yes [name], NP  oxyCODONE-acetaminophen (PERCOCET/ROXICET) 5-325 MG tablet Take 1 tablet by mouth every 6 (six) hours as needed for severe pain. 10/09/16   [name], MD    Physical Exam: Vitals:   10/09/16 0758 10/09/16 1123 10/09/16 1454 10/09/16 1650  BP: (!) 194/97 (!) 167/79 (!) 190/90 (!) 169/90  Pulse: 66 72 74   Resp: '18 18 18   '$ Temp:      TempSrc:      SpO2: 97% 95% 97%     Constitutional: NAD, calm, comfortable Vitals:   10/09/16 0758 10/09/16 1123 10/09/16 1454 10/09/16 1650  BP: (!) 194/97 (!) 167/79 (!) 190/90  (!) 169/90  Pulse: 66 72 74   Resp: '18 18 18   '$ Temp:      TempSrc:      SpO2: 97% 95% 97%    Obese, NAD Eyes: PERRL, lids and conjunctivae normal ENMT: Mucous membranes are moist. Posterior pharynx clear of any exudate or lesions.Normal dentition.  Neck: normal, supple, no masses, no thyromegaly Respiratory: clear to auscultation bilaterally, no wheezing, no crackles. Normal  respiratory effort. No accessory muscle use.  Cardiovascular: Regular rate and rhythm, no murmurs / rubs / gallops. No extremity edema. 2+ pedal pulses. No carotid bruits.  Abdomen: no tenderness, no masses palpated. No hepatosplenomegaly. Bowel sounds positive.  Musculoskeletal: no clubbing / cyanosis. No joint deformity upper and lower extremities. R knee edema, tender to palpation. Skin: no rashes, lesions, ulcers. No induration Neurologic: CN 2-12 grossly intact. Sensation intact,  Psychiatric: Normal judgment and insight. Alert and oriented x 3. Normal mood.   Labs on Admission: I have personally reviewed following labs and imaging studies  CBC:  Recent Labs Lab 10/09/16 1848  WBC 10.4  HGB 13.6  HCT 42.0  MCV 82.4  PLT 951   Basic Metabolic Panel:  Recent Labs Lab 10/09/16 1848  NA 141  K 3.5  CL 103  CO2 27  GLUCOSE 270*  BUN 15  CREATININE 1.08  CALCIUM 9.0   GFR: CrCl cannot be calculated (Unknown ideal weight.). Liver Function Tests: No results for input(s): AST, ALT, ALKPHOS, BILITOT, PROT, ALBUMIN in the last 168 hours. No results for input(s): LIPASE, AMYLASE in the last 168 hours. No results for input(s): AMMONIA in the last 168 hours. Coagulation Profile: No results for input(s): INR, PROTIME in the last 168 hours. Cardiac Enzymes: No results for input(s): CKTOTAL, CKMB, CKMBINDEX, TROPONINI in the last 168 hours. BNP (last 3 results) No results for input(s): PROBNP in the last 8760 hours. HbA1C:  Recent Labs  10/09/16 1848  HGBA1C 8.9*   CBG:  Recent Labs Lab  10/09/16 1343 10/09/16 1743  GLUCAP 285* 333*   Lipid Profile: No results for input(s): CHOL, HDL, LDLCALC, TRIG, CHOLHDL, LDLDIRECT in the last 72 hours. Thyroid Function Tests: No results for input(s): TSH, T4TOTAL, FREET4, T3FREE, THYROIDAB in the last 72 hours. Anemia Panel: No results for input(s): VITAMINB12, FOLATE, FERRITIN, TIBC, IRON, RETICCTPCT in the last 72 hours. Urine analysis:    Component Value Date/Time   COLORURINE RED (Shermeka Rutt) 12/04/2015 0805   APPEARANCEUR CLOUDY (Kaeli Nichelson) 12/04/2015 0805   LABSPEC 1.036 (H) 12/04/2015 0805   PHURINE 6.0 12/04/2015 0805   GLUCOSEU >1000 (Lamarco Gudiel) 12/04/2015 0805   HGBUR LARGE (Fedrick Cefalu) 12/04/2015 0805   BILIRUBINUR NEGATIVE 12/04/2015 0805   BILIRUBINUR small 10/11/2014 1152   KETONESUR NEGATIVE 12/04/2015 0805   PROTEINUR 100 (Richar Dunklee) 12/04/2015 0805   UROBILINOGEN 0.2 10/11/2014 1152   UROBILINOGEN 0.2 05/19/2014 1412   NITRITE NEGATIVE 12/04/2015 0805   LEUKOCYTESUR MODERATE (Karna Abed) 12/04/2015 0805    Radiological Exams on Admission: Mr Knee Right Wo Contrast  Result Date: 10/09/2016 CLINICAL DATA:  Right knee pain and weakness. EXAM: MRI OF THE RIGHT KNEE WITHOUT CONTRAST TECHNIQUE: Multiplanar, multisequence MR imaging of the knee was performed. No intravenous contrast was administered. COMPARISON:  Right knee x-rays from same date. FINDINGS: MENISCI Medial meniscus: Tearing of the posterior root. Degenerative signal throughout the body and posterior horn with blunting of the body free edge Lateral meniscus: Linear degenerative signal throughout the body and posterior horn without discrete tear. LIGAMENTS Cruciates:  Intact ACL and PCL. Collaterals: The medial collateral ligament is intact. The iliotibial band and biceps femoris tendon are intact. There is increased fluid signal within the proximal fibular collateral ligament, consistent with partial tear. CARTILAGE Patellofemoral: Large marginal osteophytes. Large areas of full-thickness cartilage loss  throughout the trochlea. There is March Steyer partial-thickness cartilage loss over the patellar apex, with scattered areas of abnormal degenerative signal in the patellar cartilage. Medial: Large marginal osteophytes. Large areas of  full-thickness cartilage loss overlying the central weight-bearing medial femoral condyle. Lateral: Large marginal osteophytes. Significant to use thinning of the cartilage overlying the lateral femoral condyles. Large focal full-thickness cartilage defect along the mesial aspect of the lateral tibial plateau. Joint:  Moderate joint effusion. Popliteal Fossa:  No Baker cyst. Intact popliteus tendon. Extensor Mechanism: Intact quadriceps tendon and patellar tendon. Increased intermediate signal within the distal patellar tendon, likely representing tendinosis. Bones: No focal marrow signal abnormality. No fracture or dislocation. Other: None. IMPRESSION: 1. Degenerative signal throughout the body and posterior horn of the medial meniscus with tearing at the posterior root and free edge radial tearing of the body. 2. Degenerative signal throughout the body and posterior horn of the lateral meniscus without discrete tear. 3. Severe tricompartmental degenerative changes. Moderate joint effusion. 4. Partial tear of the proximal fibular collateral ligament. Electronically Signed   By: Titus Dubin M.D.   On: 10/09/2016 12:04   Dg Knee Complete 4 Views Left  Result Date: 10/09/2016 CLINICAL DATA:  Bilateral knee pain after fall today. EXAM: LEFT KNEE - COMPLETE 4+ VIEW COMPARISON:  None. FINDINGS: No acute fracture or dislocation. Sequela of prior ACL repair. Moderate tricompartmental osteoarthritis, prominent in the medial tibiofemoral patellofemoral compartments. Moderate joint effusion. Probable ossified intra-articular bodies posteriorly in the knee. IMPRESSION: 1. No acute fracture or subluxation of the left knee. 2. Postsurgical change with moderate osteoarthritis, joint effusion and  probable intra-articular bodies posteriorly. Electronically Signed   By: Jeb Levering M.D.   On: 10/09/2016 00:38   Dg Knee Complete 4 Views Right  Result Date: 10/09/2016 CLINICAL DATA:  Bilateral knee pain after fall today. EXAM: RIGHT KNEE - COMPLETE 4+ VIEW COMPARISON:  Radiographs 08/21/2016 FINDINGS: No acute fracture or dislocation. Advanced tricompartmental osteoarthritis, unchanged from prior exam. Moderate knee joint effusion has increased. IMPRESSION: 1. No acute fracture or dislocation of the right knee pain. Two advanced tricompartmental osteoarthritis. Knee joint effusion which has increased from prior exam. Electronically Signed   By: Jeb Levering M.D.   On: 10/09/2016 00:39    EKG: Independently reviewed. none  Assessment/Plan Principal Problem:   Meniscal injury Active Problems:   MORBID OBESITY   HTN (hypertension)   Osteoarthritis, knee   Hyperlipidemia with target LDL less than 100   Severe obesity (BMI >= 40) (HCC)   Type 2 diabetes mellitus with chronic kidney disease, without long-term current use of insulin ([place])   Pulmonary embolism (HCC)  R knee meniscal injury: MRI with degenerative findings and tear of medial meniscus as well as severe tricuspid compartmental degenerative changes. Also moderate joint effusion. Patient also had Lory Galan partial tear of the proximal fibular collateral ligament.  (see MRI report). Seen by ortho who recommend outpatient follow-up. Will have patient work with physical therapy.  Will continue tramadol, NCCSRS reviewed and appropriate.  Add oxycodone prn.  Continue voltaren.  VTE:  Follows with Dr. Marin Olp. On xarelto.    T2DM: ssi, holding jardiance and glipizide and metformin  HTN: continue home carvedilol, amlodipine, hydralazine, losartan.  Continue ASA and statin.  Omeprazole for GERD Klor con for potassium supplement    DVT prophylaxis: home xarelto Code Status: full  Family Communication: partner in room  Disposition  Plan: home after able to ambulate  Consults called: ortho Admission status: obs   Raguel Kosloski Melven Sartorius MD [facility] [phone]   If 7PM-7AM, please contact night-coverage [email or web address] Password [facility]  10/09/2016, 8:38 PM

## 2016-10-09 NOTE — ED Notes (Signed)
Attempting to ambulate at this time

## 2016-10-09 NOTE — ED Notes (Signed)
Pt taken back to triage to be reassissed

## 2016-10-09 NOTE — ED Notes (Signed)
Ace wrap applied to both knees

## 2016-10-09 NOTE — Consult Note (Addendum)
Reason for Consult:Knee injury Referring Physician: J Mesner  Thomas [name] is an 66 y.o. male.  HPI: Jasmon was walking last night when his knee gave out and he fell on it and then backwards. He felt/heard a pop when he landed. He hobbled to bed and iced it but could not put weight down on it after giving it a few hours and so came to the ED for evaluation. MRI showed a meniscal tear. They were unable to adequately control his pain in the ED and he was being admitted for pain control and mobilization and orthopedic surgery was consulted. He notes some prior history of that knee giving way resulting in at least one prior fall. He has seen Dr. [name] in the past for arthritis in that knee and has received a steroid injection which did help. He is currently on Xarelto for DVT in the contralateral leg.  Past Medical History:  Diagnosis Date  . Arthritis   . Cancer [facility]) 2010   Prostate  . Chronic kidney disease   . Diabetes mellitus   . DVT (deep vein thrombosis) in pregnancy ([place])   . Genetic testing 06/22/2016   Mr. Kaps underwent genetic counseling and testing for hereditary cancer syndromes on 05/14/2016. His results were negative for mutations in all 46 genes analyzed by [facility]'s 46-gene Common Hereditary Cancers Panel. Genes analyzed include: APC, ATM, AXIN2, BARD1, BMPR1A, BRCA1, BRCA2, BRIP1, CDH1, CDKN2A, CHEK2, CTNNA1, DICER1, EPCAM, GREM1, HOXB13, KIT, MEN1, MLH1, MSH2, MSH3, MSH6, MUTYH, NB  . Hypertension   . PE (pulmonary thromboembolism) ([place])     Past Surgical History:  Procedure Laterality Date  . HERNIA REPAIR    . KNEE SURGERY    . PROSTATE SURGERY    . RADIOACTIVE SEED IMPLANT    . SHOULDER SURGERY    . uretha surgery-2014      Family History  Problem Relation Age of Onset  . Breast cancer Mother 77       d.89  . Breast cancer Sister 85       d.30  . Leukemia Brother 18       d.20  . Breast cancer Maternal Aunt 40       d.40s  . Lung cancer Maternal  Uncle   . Prostate cancer Paternal Uncle   . Prostate cancer Brother        recurred recently at age 93  . Other Brother 15       spinal tumor  . Cervical cancer Other 22       d.22  . Cancer Sister 75       unspecified type  . Cancer Maternal Uncle 80       unspecified type    Social History:  reports that he has never smoked. He has never used smokeless tobacco. He reports that he does not drink alcohol or use drugs.  Allergies:  Allergies  Allergen Reactions  . Lisinopril Cough    Medications: I have reviewed the patient's current medications.  Results for orders placed or performed during the hospital encounter of 10/09/16 (from the past 48 hour(s))  CBG monitoring, ED     Status: Abnormal   Collection Time: 10/09/16  1:43 PM  Result Value Ref Range   Glucose-Capillary 285 (H) 65 - 99 mg/dL    Mr Knee Right Wo Contrast  Result Date: 10/09/2016 CLINICAL DATA:  Right knee pain and weakness. EXAM: MRI OF THE RIGHT KNEE WITHOUT CONTRAST TECHNIQUE: Multiplanar, multisequence MR imaging of  the knee was performed. No intravenous contrast was administered. COMPARISON:  Right knee x-rays from same date. FINDINGS: MENISCI Medial meniscus: Tearing of the posterior root. Degenerative signal throughout the body and posterior horn with blunting of the body free edge Lateral meniscus: Linear degenerative signal throughout the body and posterior horn without discrete tear. LIGAMENTS Cruciates:  Intact ACL and PCL. Collaterals: The medial collateral ligament is intact. The iliotibial band and biceps femoris tendon are intact. There is increased fluid signal within the proximal fibular collateral ligament, consistent with partial tear. CARTILAGE Patellofemoral: Large marginal osteophytes. Large areas of full-thickness cartilage loss throughout the trochlea. There is a partial-thickness cartilage loss over the patellar apex, with scattered areas of abnormal degenerative signal in the patellar  cartilage. Medial: Large marginal osteophytes. Large areas of full-thickness cartilage loss overlying the central weight-bearing medial femoral condyle. Lateral: Large marginal osteophytes. Significant to use thinning of the cartilage overlying the lateral femoral condyles. Large focal full-thickness cartilage defect along the mesial aspect of the lateral tibial plateau. Joint:  Moderate joint effusion. Popliteal Fossa:  No Baker cyst. Intact popliteus tendon. Extensor Mechanism: Intact quadriceps tendon and patellar tendon. Increased intermediate signal within the distal patellar tendon, likely representing tendinosis. Bones: No focal marrow signal abnormality. No fracture or dislocation. Other: None. IMPRESSION: 1. Degenerative signal throughout the body and posterior horn of the medial meniscus with tearing at the posterior root and free edge radial tearing of the body. 2. Degenerative signal throughout the body and posterior horn of the lateral meniscus without discrete tear. 3. Severe tricompartmental degenerative changes. Moderate joint effusion. 4. Partial tear of the proximal fibular collateral ligament. Electronically Signed   By: Titus Dubin M.D.   On: 10/09/2016 12:04   Dg Knee Complete 4 Views Left  Result Date: 10/09/2016 CLINICAL DATA:  Bilateral knee pain after fall today. EXAM: LEFT KNEE - COMPLETE 4+ VIEW COMPARISON:  None. FINDINGS: No acute fracture or dislocation. Sequela of prior ACL repair. Moderate tricompartmental osteoarthritis, prominent in the medial tibiofemoral patellofemoral compartments. Moderate joint effusion. Probable ossified intra-articular bodies posteriorly in the knee. IMPRESSION: 1. No acute fracture or subluxation of the left knee. 2. Postsurgical change with moderate osteoarthritis, joint effusion and probable intra-articular bodies posteriorly. Electronically Signed   By: Jeb Levering M.D.   On: 10/09/2016 00:38   Dg Knee Complete 4 Views Right  Result Date:  10/09/2016 CLINICAL DATA:  Bilateral knee pain after fall today. EXAM: RIGHT KNEE - COMPLETE 4+ VIEW COMPARISON:  Radiographs 08/21/2016 FINDINGS: No acute fracture or dislocation. Advanced tricompartmental osteoarthritis, unchanged from prior exam. Moderate knee joint effusion has increased. IMPRESSION: 1. No acute fracture or dislocation of the right knee pain. Two advanced tricompartmental osteoarthritis. Knee joint effusion which has increased from prior exam. Electronically Signed   By: Jeb Levering M.D.   On: 10/09/2016 00:39    Review of Systems  Constitutional: Negative for weight loss.  HENT: Negative for ear discharge, ear pain, hearing loss and tinnitus.   Eyes: Negative for blurred vision, double vision, photophobia and pain.  Respiratory: Negative for cough, sputum production and shortness of breath.   Cardiovascular: Negative for chest pain.  Gastrointestinal: Negative for abdominal pain, nausea and vomiting.  Genitourinary: Negative for dysuria, flank pain, frequency and urgency.  Musculoskeletal: Positive for joint pain. Negative for back pain, falls, myalgias and neck pain.  Neurological: Negative for dizziness, tingling, sensory change, focal weakness, loss of consciousness and headaches.  Endo/Heme/Allergies: Does not bruise/bleed easily.  Psychiatric/Behavioral: Negative  for depression, memory loss and substance abuse. The patient is not nervous/anxious.    Blood pressure (!) 190/90, pulse 74, temperature 97.9 F (36.6 C), temperature source Oral, resp. rate 18, SpO2 97 %. Physical Exam  Constitutional: He appears well-developed and well-nourished. No distress.  HENT:  Head: Normocephalic.  Eyes: Conjunctivae are normal. Right eye exhibits no discharge. Left eye exhibits no discharge. No scleral icterus.  Cardiovascular: Normal rate and regular rhythm.   Respiratory: Effort normal. No respiratory distress.  Musculoskeletal:  RLE No traumatic wounds, ecchymosis, or  rash  Severe diffuse TTP knee, severe pain with AROM/PROM  Mild knee effusion, no ankle effusion  Could not assess knee stability 2/2 pain  Sens DPN, SPN, TN intact  Motor EHL, ext, flex, evers 5/5  DP 2+, PT 0, No significant edema  Neurological: He is alert.  Skin: Skin is warm and dry. He is not diaphoretic.  Psychiatric: He has a normal mood and affect. His behavior is normal.    Assessment/Plan: Fall Right knee medial meniscal tear -- KI when ambulating, WBAT, PT/OT, pain control. Dr. Veverly Fells will evaluate in the AM. Plan OP f/u with either Dr. Veverly Fells or Dr. [name], pt's choice. Right knee OA      Lisette Abu, PA-C Orthopedic Surgery [phone] 10/09/2016, 3:11 PM    I have personally seen and examined Mr Elsie Saas and agree with the assessment above.  The primary finding on the MRI of the right knee is severe end staged arthritis.  The patient has likely severely sprained the knee on top of the OA as a cause for the significant increase in his pain.  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 He has seen Dr [name] in the past and has received a steroid injection in the knee. My attempt to move his knee from the flexed position caused him severe pain.  He will likely need a period of true rest for the right knee with pivot transfers on the left and wheelchair ambulation for now.  Will request an overhead frame and trapeze bar and also a cryo unit for the right knee.  He also needs SCDs as he is at extreme risk for DVT.  He has had a prior PE.  Will follow up with Dr [name] as outpatient as he is established with their practice.  Netta Cedars MD Antionette Char

## 2016-10-10 DIAGNOSIS — M25562 Pain in left knee: Secondary | ICD-10-CM

## 2016-10-10 DIAGNOSIS — S838X1D Sprain of other specified parts of right knee, subsequent encounter: Secondary | ICD-10-CM | POA: Diagnosis not present

## 2016-10-10 DIAGNOSIS — M25561 Pain in right knee: Secondary | ICD-10-CM | POA: Diagnosis not present

## 2016-10-10 DIAGNOSIS — S83241A Other tear of medial meniscus, current injury, right knee, initial encounter: Secondary | ICD-10-CM | POA: Diagnosis not present

## 2016-10-10 LAB — GLUCOSE, CAPILLARY
Glucose-Capillary: 208 mg/dL — ABNORMAL HIGH (ref 65–99)
Glucose-Capillary: 200 mg/dL — ABNORMAL HIGH (ref 65–99)
Glucose-Capillary: 202 mg/dL — ABNORMAL HIGH (ref 65–99)
Glucose-Capillary: 225 mg/dL — ABNORMAL HIGH (ref 65–99)

## 2016-10-10 MED ORDER — OXYCODONE HCL 5 MG PO TABS: 10.0000 mg | ORAL_TABLET | ORAL | 0 refills | Status: DC | PRN

## 2016-10-10 MED ORDER — TRAMADOL HCL 50 MG PO TABS: 50.0000 mg | ORAL_TABLET | Freq: Four times a day (QID) | ORAL | 0 refills | Status: DC | PRN

## 2016-10-10 MED ORDER — OXYCODONE HCL 5 MG PO TABS
10.0000 mg | ORAL_TABLET | ORAL | Status: DC | PRN
  Administered 2016-10-10 – 2016-10-11 (×2): 10 mg via ORAL
  Filled 2016-10-10 (×2): qty 2

## 2016-10-10 MED ORDER — NAPROXEN 500 MG PO TABS: 500.0000 mg | ORAL_TABLET | Freq: Two times a day (BID) | ORAL | 0 refills | Status: DC

## 2016-10-10 NOTE — Discharge Instructions (Addendum)
We want you to follow-up with your orthopedic physician for further workup including potentially MRI imaging. Please return with any warmth, redness, to your knees, or other concerns.   Information on my medicine - XARELTO (rivaroxaban)  WHY WAS XARELTO PRESCRIBED FOR YOU? Xarelto was prescribed to treat blood clots that may have been found in the veins of your legs (deep vein thrombosis) or in your lungs (pulmonary embolism) and to reduce the risk of them occurring again.  What do you need to know about Xarelto? The dose is one 20 mg tablet taken ONCE A DAY with your evening meal.  DO NOT stop taking Xarelto without talking to the health care provider who prescribed the medication.  Refill your prescription for 20 mg tablets before you run out.  After discharge, you should have regular check-up appointments with your healthcare provider that is prescribing your Xarelto.  In the future your dose may need to be changed if your kidney function changes by a significant amount.  What do you do if you miss a dose? If you are taking Xarelto ONCE DAILY and you miss a dose, take it as soon as you remember on the same day then continue your regularly scheduled once daily regimen the next day. Do not take two doses of Xarelto at the same time.   Important Safety Information Xarelto is a blood thinner medicine that can cause bleeding. You should call your healthcare provider right away if you experience any of the following: ? Bleeding from an injury or your nose that does not stop. ? Unusual colored urine (red or dark brown) or unusual colored stools (red or black). ? Unusual bruising for unknown reasons. ? A serious fall or if you hit your head (even if there is no bleeding).  Some medicines may interact with Xarelto and might increase your risk of bleeding while on Xarelto. To help avoid this, consult your healthcare provider or pharmacist prior to using any new prescription or  non-prescription medications, including herbals, vitamins, non-steroidal anti-inflammatory drugs (NSAIDs) and supplements.  This website has more information on Xarelto: [email or web address]

## 2016-10-10 NOTE — Care Management Note (Signed)
Case Management Note  Patient Details  Name: QUANTAVIUS HUMM MRN: [id number] Date of Birth: 07/05/1950  Subjective/Objective: 66 y.o.M to be discharged with HHPT and DME today. Pt upset that he is being discharged and feels he should be kept until AM when family members will not be involved in "family event". CM has arranged HHPT with [facility] as his choice. [facility] will provide DME.                    Action/Plan:CM will sign off for now but will be available should additional discharge needs arise or disposition change.    Expected Discharge Date:  10/10/16               Expected Discharge Plan:  Home/Self Care  In-House Referral:  NA  Discharge planning Services  CM Consult  Post Acute Care Choice:  Durable Medical Equipment, Home Health Choice offered to:  Patient  DME Arranged:  3-N-1, Wheelchair manual DME Agency:  [place]:  PT [facility] Agency:  [place]  Status of Service:  Completed, signed off  If discussed at [place] of Stay Meetings, dates discussed:    Additional Comments:  Delrae Sawyers, RN 10/10/2016, 3:58 PM

## 2016-10-10 NOTE — Progress Notes (Signed)
Patient is established with Dr Eduard Roux.  I notified Dr [name] of Mr Venuti's admission and condition.  Dr [name] to follow up as outpatient.  Mobilization as tolerated.

## 2016-10-10 NOTE — Progress Notes (Signed)
Patient unable to transfer outp of bed due to pain. He also does not have enough help at home and needed equipments have not been delivered at home  Yet.  patient is not safe to be discharged home with significant need for help at home and fall risk. Will cancel his discharge and have PT reevaluate him tomorrow. May need short term rehab if unable participate with PT tomorrow.

## 2016-10-10 NOTE — Discharge Summary (Addendum)
Physician Discharge Summary  Thomas [name] LKG:[id number] DOB: June 13, 1950 DOA: 10/09/2016  PCP: [name], NP  Admit date: 10/09/2016 Discharge date: 10/11/2016  Admitted From: home Disposition:  home  Recommendations for Outpatient Follow-up:  1. Follow up with Dr [name] in 2 week.    Home Health:PT/OT Equipment/Devices: wheelchair, 3 in 1, sliding board, rolling walker with 5"wheels, hoyer lift  Discharge Condition:table CODE STATUS:full code Diet recommendation: diabetic    Discharge Diagnoses:  Principal Problem:   Right knee sprain/ Meniscal injury   Active Problems:   MORBID OBESITY   HTN (hypertension)   Osteoarthritis, knee   Hyperlipidemia with target LDL less than 100   Severe obesity (BMI >= 40) (HCC)   Type 2 diabetes mellitus with chronic kidney disease, without long-term current use of insulin (HCC)   Pulmonary embolism (HCC)  Brief narrative/history of present illness 66 year old morbidly obese male with chronic arthritis, type 2 diabetes mellitus, DVT on anticoagulation hypertension who presented after a fall . Patient reported having pain in his right knee for the past few months. In the ED MRI of the knee was done which showed medial meniscus tear and severe tricompartmental degenerative changes. Patient was unable to walk even with crutches due to pain and monitored on observation. Orthopedics consulted.  Hospital course Medial meniscus tear Seen by orthopedics who reviewed the MRI and suggested he likely had a sprain of his severely arthritic knee. His meniscus tear seems to be degenerative and chronic.Recommend total knee replacement electively once pain and his other corbidities are better controlled.   Seen by PT. Was unable to get out of bed and participate with PT yesterday so discharge was held. Pain much improved today and able to participate with PT this morning. PT recommends home health with rolling walker, wheelchair, 3in 1 and a  hoyer lift. Equipments arranged prior to discharge home with daughter. [name] PT and OT. Prescribed oxycodone and tramadol prn for pain. continue diclofenac gel topically.   Follow up with Dr [name] in 1-2 weeks  history of DVT Continue Xarelto   Type 2 diabetes mellitus Resume home medications.  Essential hypertension On multiple medications (Coreg, amlodipine, hydralazine, losartan)  GERD Continue PPI  Hypokalemia Replenished  Morbid obesity   Procedures:  MRI right knee  Consults:  [facility] ( Dr [name])  family communication: Multiple family members At bedside  Discharge Instructions   Allergies as of 10/10/2016      Reactions   Lisinopril Cough      Medication List    TAKE these medications   amLODipine 10 MG tablet Commonly known as:  NORVASC Take 1 tablet (10 mg total) by mouth daily. For high blood pressure What changed:  when to take this  additional instructions   aspirin EC 81 MG tablet Take 81 mg by mouth daily.   atorvastatin 10 MG tablet Commonly known as:  LIPITOR Take one tablet by mouth once daily for cholesterol   carvedilol 6.25 MG tablet Commonly known as:  COREG Take 1 tablet (6.25 mg total) by mouth 2 (two) times daily with a meal. For high blood pressure   diclofenac sodium 1 % Gel Commonly known as:  VOLTAREN Apply 2 g topically 4 (four) times daily. What changed:  when to take this  reasons to take this   empagliflozin 10 MG Tabs tablet Commonly known as:  JARDIANCE Take 10 mg by mouth daily.   glipiZIDE 5 MG tablet Commonly known as:  GLUCOTROL Take 1  tablet (5 mg total) by mouth daily before breakfast.   hydrALAZINE 25 MG tablet Commonly known as:  APRESOLINE Take 1 tablet (25 mg total) by mouth 3 (three) times daily.   losartan 100 MG tablet Commonly known as:  COZAAR Take 1 tablet (100 mg total) by mouth daily.   metFORMIN 500 MG tablet Commonly known as:  GLUCOPHAGE TAKE 1 TABLET BY MOUTH  2 TIMES A DAY WITH A MEAL for diabetes   omeprazole 20 MG capsule Commonly known as:  PRILOSEC Take 1 capsule (20 mg total) by mouth as needed.   oxyCODONE 5 MG immediate release tablet Commonly known as:  Oxy IR/ROXICODONE Take 2 tablets (10 mg total) by mouth every 4 (four) hours as needed for moderate pain.   potassium chloride SA 20 MEQ tablet Commonly known as:  K-DUR,KLOR-CON Take 20 mEq by mouth 2 (two) times daily.   rivaroxaban 20 MG Tabs tablet Commonly known as:  XARELTO Take 1 tablet (20 mg total) by mouth daily with supper.   traMADol 50 MG tablet Commonly known as:  ULTRAM Take 1 tablet (50 mg total) by mouth every 6 (six) hours as needed. for pain What changed:  when to take this   TRUE METRIX BLOOD GLUCOSE TEST test strip Generic drug:  glucose blood CHECK BLOOD SUGAR TWICE PER DAY   TRUEPLUS LANCETS 30G Misc USE TO TEST BLOOD SUGAR 2 TIMES A DAY            Durable Medical Equipment        Start     Ordered   10/10/16 1406  For home use only DME Walker rolling  Once    Comments:  With 5" wheel  Question:  Patient needs a walker to treat with the following condition  Answer:  Meniscal injury, right, initial encounter   10/10/16 1407   10/10/16 1405  For home use only DME lightweight manual wheelchair with seat cushion  Once    Comments:  Patient suffers from Meniscal injury which impairs their ability to perform daily activities like ADLs at home. A walking aid or a walker  will not resolve  issue with performing activities of daily living. A wheelchair will allow patient to safely perform daily activities. Patient is not able to propel themselves in the home using a standard weight wheelchair due to severe b/l knee pain. Patient can self propel in the lightweight wheelchair.  Accessories: elevating leg rests (ELRs), wheel locks, extensions and anti-tippers.   10/10/16 1407   10/10/16 1404  For home use only DME wheelchair cushion (seat and back)  Once      10/10/16 1407       Discharge Care Instructions        Start     Ordered   10/10/16 0000  traMADol (ULTRAM) 50 MG tablet  Every 6 hours PRN     10/10/16 1545   10/10/16 0000  oxyCODONE (OXY IR/ROXICODONE) 5 MG immediate release tablet  Every 4 hours PRN     10/10/16 1545     Follow-up Information    [name], NP Follow up.   Specialty:  Geriatric Medicine Contact information: [address] [place] [address] [phone]        Leandrew Koyanagi, MD Follow up in 1 week(s).   Specialty:  Orthopedic Surgery Contact information: [place] [address] [phone]          Allergies  Allergen Reactions  . Lisinopril  Cough      Procedures/Studies: Mr Knee Right Wo Contrast  Result Date: 10/09/2016 CLINICAL DATA:  Right knee pain and weakness. EXAM: MRI OF THE RIGHT KNEE WITHOUT CONTRAST TECHNIQUE: Multiplanar, multisequence MR imaging of the knee was performed. No intravenous contrast was administered. COMPARISON:  Right knee x-rays from same date. FINDINGS: MENISCI Medial meniscus: Tearing of the posterior root. Degenerative signal throughout the body and posterior horn with blunting of the body free edge Lateral meniscus: Linear degenerative signal throughout the body and posterior horn without discrete tear. LIGAMENTS Cruciates:  Intact ACL and PCL. Collaterals: The medial collateral ligament is intact. The iliotibial band and biceps femoris tendon are intact. There is increased fluid signal within the proximal fibular collateral ligament, consistent with partial tear. CARTILAGE Patellofemoral: Large marginal osteophytes. Large areas of full-thickness cartilage loss throughout the trochlea. There is a partial-thickness cartilage loss over the patellar apex, with scattered areas of abnormal degenerative signal in the patellar cartilage. Medial: Large marginal osteophytes. Large areas of full-thickness cartilage loss overlying  the central weight-bearing medial femoral condyle. Lateral: Large marginal osteophytes. Significant to use thinning of the cartilage overlying the lateral femoral condyles. Large focal full-thickness cartilage defect along the mesial aspect of the lateral tibial plateau. Joint:  Moderate joint effusion. Popliteal Fossa:  No Baker cyst. Intact popliteus tendon. Extensor Mechanism: Intact quadriceps tendon and patellar tendon. Increased intermediate signal within the distal patellar tendon, likely representing tendinosis. Bones: No focal marrow signal abnormality. No fracture or dislocation. Other: None. IMPRESSION: 1. Degenerative signal throughout the body and posterior horn of the medial meniscus with tearing at the posterior root and free edge radial tearing of the body. 2. Degenerative signal throughout the body and posterior horn of the lateral meniscus without discrete tear. 3. Severe tricompartmental degenerative changes. Moderate joint effusion. 4. Partial tear of the proximal fibular collateral ligament. Electronically Signed   By: Titus Dubin M.D.   On: 10/09/2016 12:04   Dg Knee Complete 4 Views Left  Result Date: 10/09/2016 CLINICAL DATA:  Bilateral knee pain after fall today. EXAM: LEFT KNEE - COMPLETE 4+ VIEW COMPARISON:  None. FINDINGS: No acute fracture or dislocation. Sequela of prior ACL repair. Moderate tricompartmental osteoarthritis, prominent in the medial tibiofemoral patellofemoral compartments. Moderate joint effusion. Probable ossified intra-articular bodies posteriorly in the knee. IMPRESSION: 1. No acute fracture or subluxation of the left knee. 2. Postsurgical change with moderate osteoarthritis, joint effusion and probable intra-articular bodies posteriorly. Electronically Signed   By: Jeb Levering M.D.   On: 10/09/2016 00:38   Dg Knee Complete 4 Views Right  Result Date: 10/09/2016 CLINICAL DATA:  Bilateral knee pain after fall today. EXAM: RIGHT KNEE - COMPLETE 4+ VIEW  COMPARISON:  Radiographs 08/21/2016 FINDINGS: No acute fracture or dislocation. Advanced tricompartmental osteoarthritis, unchanged from prior exam. Moderate knee joint effusion has increased. IMPRESSION: 1. No acute fracture or dislocation of the right knee pain. Two advanced tricompartmental osteoarthritis. Knee joint effusion which has increased from prior exam. Electronically Signed   By: Jeb Levering M.D.   On: 10/09/2016 00:39      Subjective: Complains of ongoing pain in his right knee. Hesitant to participate with PT.  Discharge Exam: Vitals:   10/10/16 0933 10/10/16 1444  BP: (!) 176/85 132/61  Pulse: 70 68  Resp: 18 18  Temp:  97.6 F (36.4 C)  SpO2: 92% 92%   Vitals:   10/09/16 2040 10/10/16 0626 10/10/16 0933 10/10/16 1444  BP: (!) 179/79 (!) 165/68 (!) 176/85  132/61  Pulse: 83 72 70 68  Resp: 18 20 18 18   Temp: 99.6 F (37.6 C) 98.4 F (36.9 C)  97.6 F (36.4 C)  TempSrc: Oral Oral  Oral  SpO2: 94% 92% 92% 92%    General: elderly morbidly obese male not in distress HEENT: Moist mucosa, supple neck Chest: Clear bilaterally CVS: Normal S1 and S2, no murmurs GI: Soft, nondistended, nontender Musculoskeletal: mild swelling over rt knee with  impaired mobility    The results of significant diagnostics from this hospitalization (including imaging, microbiology, ancillary and laboratory) are listed below for reference.     Microbiology: No results found for this or any previous visit (from the past 240 hour(s)).   Labs: BNP (last 3 results)  Recent Labs  06/13/16 1105  BNP 301.6*   Basic Metabolic Panel:  Recent Labs Lab 10/09/16 1848  NA 141  K 3.5  CL 103  CO2 27  GLUCOSE 270*  BUN 15  CREATININE 1.08  CALCIUM 9.0   Liver Function Tests: No results for input(s): AST, ALT, ALKPHOS, BILITOT, PROT, ALBUMIN in the last 168 hours. No results for input(s): LIPASE, AMYLASE in the last 168 hours. No results for input(s): AMMONIA in the last  168 hours. CBC:  Recent Labs Lab 10/09/16 1848  WBC 10.4  HGB 13.6  HCT 42.0  MCV 82.4  PLT 248   Cardiac Enzymes: No results for input(s): CKTOTAL, CKMB, CKMBINDEX, TROPONINI in the last 168 hours. BNP: Invalid input(s): POCBNP CBG:  Recent Labs Lab 10/09/16 1343 10/09/16 1743 10/09/16 2038 10/10/16 0643 10/10/16 1140  GLUCAP 285* 333* 233* 208* 200*   D-Dimer No results for input(s): DDIMER in the last 72 hours. Hgb A1c  Recent Labs  10/09/16 1848  HGBA1C 8.9*   Lipid Profile No results for input(s): CHOL, HDL, LDLCALC, TRIG, CHOLHDL, LDLDIRECT in the last 72 hours. Thyroid function studies No results for input(s): TSH, T4TOTAL, T3FREE, THYROIDAB in the last 72 hours.  Invalid input(s): FREET3 Anemia work up No results for input(s): VITAMINB12, FOLATE, FERRITIN, TIBC, IRON, RETICCTPCT in the last 72 hours. Urinalysis    Component Value Date/Time   COLORURINE RED (A) 12/04/2015 0805   APPEARANCEUR CLOUDY (A) 12/04/2015 0805   LABSPEC 1.036 (H) 12/04/2015 0805   PHURINE 6.0 12/04/2015 0805   GLUCOSEU >1000 (A) 12/04/2015 0805   HGBUR LARGE (A) 12/04/2015 0805   BILIRUBINUR NEGATIVE 12/04/2015 0805   BILIRUBINUR small 10/11/2014 1152   KETONESUR NEGATIVE 12/04/2015 0805   PROTEINUR 100 (A) 12/04/2015 0805   UROBILINOGEN 0.2 10/11/2014 1152   UROBILINOGEN 0.2 05/19/2014 1412   NITRITE NEGATIVE 12/04/2015 0805   LEUKOCYTESUR MODERATE (A) 12/04/2015 0805   Sepsis Labs Invalid input(s): PROCALCITONIN,  WBC,  LACTICIDVEN Microbiology No results found for this or any previous visit (from the past 240 hour(s)).   Time coordinating discharge: < 30 minutes  SIGNED:   Louellen Molder, MD  [facility] 10/10/2016, 3:45 PM Pager   If 7PM-7AM, please contact night-coverage [email or web address] Password [email or web address]

## 2016-10-10 NOTE — Evaluation (Addendum)
Physical Therapy Evaluation Patient Details Name: Thomas [name] MRN: [id number] DOB: November 03, 1950 Today's Date: 10/10/2016   History of Present Illness  66 y.o. male with medical history significant of arthritis, chronic kidney disease, type 2 diabetes, venous thrombus embolism, and hypertension who presents after a fall.  Pt with primary c/o severe R knee pain.  MRI was reviewed by ortho and determined to not reveal any acute issues. He mainly had a sprain of his severely arthritic knee. His meniscus tears appeared to be degenerative and chronic.   Clinical Impression  Pt admitted with above diagnosis. Pt currently with functional limitations due to the deficits listed below (see PT Problem List). On eval, pt required min assist bed mobility utilizing bedrails and trapeze. Pt unable to progress beyond EOB due to pain. Multiple methods attempted for transfers without success: sit to stand with RW, sit to stand with bariatric stedy, and slide board transfer bed to w/c. Pt's fear of pain hindering his ability to move. He would call out in pain when he anticipated something was going to hit his foot (i.e. When the bedside table was moved or RW placed in front of pt) even when no contact was made.  During the session, pt reported his pain was too high to continue and requested return to supine in bed. Pt had received pain meds at 1130. Pt will benefit from skilled PT to increase their independence and safety with mobility to allow discharge to the venue listed below.     Follow Up Recommendations Home health PT;Supervision for mobility/OOB    Equipment Recommendations  Wheelchair cushion (measurements PT);Wheelchair (measurements PT);Rolling walker with 5" wheels;3in1 (PT);Other (comment) (sliding board)  If pt unable to perform slide board transfers to w/c, recommend hoyer lift for home.   Recommendations for Other Services       Precautions / Restrictions Precautions Precautions:  Fall Required Braces or Orthoses: Knee Immobilizer - Right Knee Immobilizer - Right: Other (comment) (wear schedule not specified in order) Restrictions RLE Weight Bearing: Weight bearing as tolerated      Mobility  Bed Mobility Overal bed mobility: Needs Assistance Bed Mobility: Supine to Sit;Sit to Supine     Supine to sit: Min assist;HOB elevated Sit to supine: Min assist;HOB elevated   General bed mobility comments: +rail and use of trapeze. Assist with RLE.  Transfers Overall transfer level: Needs assistance Equipment used: Rolling walker (2 wheeled) Transfers: Sit to/from Stand Sit to Stand: Max assist         General transfer comment: Attempted multiple methods for sit to stand and pivot transfer without success. Pt unable to clear bottom from bed even when elevated. Pt demo poor motor planning skills and fear of increased pain with movement.   Ambulation/Gait             General Gait Details: unable  Stairs            Wheelchair Mobility    Modified Rankin (Stroke Patients Only)       Balance Overall balance assessment: History of Falls                                           Pertinent Vitals/Pain Pain Assessment: 0-10 Pain Score: 7  Pain Location: R knee Pain Descriptors / Indicators: Sharp;Shooting;Grimacing;Guarding Pain Intervention(s): Limited activity within patient's tolerance;Monitored during session;Premedicated before session;Ice applied    Home  Living Family/patient expects to be discharged to:: Private residence Living Arrangements: Spouse/significant other Available Help at Discharge: Family;Available 24 hours/day Type of Home: House Home Access: Ramped entrance     Home Layout: One level Home Equipment: None      Prior Function Level of Independence: Independent               Hand Dominance        Extremity/Trunk Assessment        Lower Extremity Assessment Lower Extremity  Assessment: RLE deficits/detail RLE: Unable to fully assess due to pain       Communication   Communication: No difficulties  Cognition Arousal/Alertness: Awake/alert Behavior During Therapy: Anxious Overall Cognitive Status: Within Functional Limits for tasks assessed                                        General Comments      Exercises     Assessment/Plan    PT Assessment Patient needs continued PT services  PT Problem List Decreased activity tolerance;Decreased mobility;Decreased balance;Pain;Decreased knowledge of use of DME;Decreased coordination       PT Treatment Interventions DME instruction;Functional mobility training;Gait training;Therapeutic exercise;Therapeutic activities;Balance training;Wheelchair mobility training;Patient/family education    PT Goals (Current goals can be found in the Care Plan section)  Acute Rehab PT Goals Patient Stated Goal: home PT Goal Formulation: With patient/family Time For Goal Achievement: 10/17/16 Potential to Achieve Goals: Good Additional Goals Additional Goal #1: Family independent with transfer methods and level of assist needed.    Frequency Min 3X/week   Barriers to discharge        Co-evaluation               AM-PAC PT "6 Clicks" Daily Activity  Outcome Measure Difficulty turning over in bed (including adjusting bedclothes, sheets and blankets)?: A Lot Difficulty moving from lying on back to sitting on the side of the bed? : Unable Difficulty sitting down on and standing up from a chair with arms (e.g., wheelchair, bedside commode, etc,.)?: Unable Help needed moving to and from a bed to chair (including a wheelchair)?: Total Help needed walking in hospital room?: Total Help needed climbing 3-5 steps with a railing? : Total 6 Click Score: 7    End of Session Equipment Utilized During Treatment: Gait belt;Right knee immobilizer Activity Tolerance: Patient limited by pain Patient left: in  bed;with call bell/phone within reach;with family/visitor present Nurse Communication: Patient requests pain meds;Mobility status;Need for lift equipment PT Visit Diagnosis: Difficulty in walking, not elsewhere classified (R26.2);Pain Pain - Right/Left: Right Pain - part of body: Knee    Time: 3154-0086 PT Time Calculation (min) (ACUTE ONLY): 63 min   Charges:   PT Evaluation $PT Eval Moderate Complexity: 1 Mod PT Treatments $Therapeutic Activity: 38-52 mins   PT G Codes:   PT G-Codes **NOT FOR INPATIENT CLASS** Functional Assessment Tool Used: AM-PAC 6 Clicks Basic Mobility Functional Limitation: Mobility: Walking and moving around Mobility: Walking and Moving Around Current Status (P6195): At least 80 percent but less than 100 percent impaired, limited or restricted Mobility: Walking and Moving Around Goal Status [phone]): At least 60 percent but less than 80 percent impaired, limited or restricted    Lorrin Goodell, PT  Office # [phone] Pager [phone]   Lorriane Shire 10/10/2016, 1:59 PM

## 2016-10-10 NOTE — Progress Notes (Signed)
I saw Mr. Thomas [name] today at bedside. I reviewed his MRI findings which do not reveal any acute issues. He mainly had a sprain of his severely arthritic knee. His meniscus tears appeared to be degenerative and chronic. Patient would benefit from a total knee replacement on an elective basis once his comorbidities are well controlled. I have asked him to follow up with me in my office in the next 1-2 weeks. Mobilization and pain control while inpatient. Appreciate hospitalist admission.

## 2016-10-11 DIAGNOSIS — M170 Bilateral primary osteoarthritis of knee: Secondary | ICD-10-CM | POA: Diagnosis not present

## 2016-10-11 DIAGNOSIS — S838X1D Sprain of other specified parts of right knee, subsequent encounter: Secondary | ICD-10-CM | POA: Diagnosis not present

## 2016-10-11 DIAGNOSIS — S83241A Other tear of medial meniscus, current injury, right knee, initial encounter: Secondary | ICD-10-CM | POA: Diagnosis not present

## 2016-10-11 DIAGNOSIS — M25562 Pain in left knee: Secondary | ICD-10-CM | POA: Diagnosis not present

## 2016-10-11 DIAGNOSIS — M25561 Pain in right knee: Secondary | ICD-10-CM | POA: Diagnosis not present

## 2016-10-11 LAB — GLUCOSE, CAPILLARY: Glucose-Capillary: 224 mg/dL — ABNORMAL HIGH (ref 65–99)

## 2016-10-11 NOTE — Progress Notes (Signed)
PROGRESS NOTE                                                                                                                                                                                                             Patient Demographics:    Thomas [name], is a 66 y.o. male, DOB - Jan 10, 1951, NIO:[id number]  Admit date - 10/09/2016   Admitting Physician Ladene Artist., MD  Outpatient Primary MD for the patient is [name], NP  LOS - 0  Outpatient Specialists:Orthopedics  Chief Complaint  Patient presents with  . Fall  . Knee Pain       Brief Narrative   66 year old morbidly obese male with chronic arthritis, type 2 diabetes mellitus, DVT on anticoagulation hypertension who presented after a fall . Patient reported having pain in his right knee for the past few months. In the ED MRI of the knee was done which showed medial meniscus tear and severe tricompartmental degenerative changes. Patient was unable to walk even with crutches due to pain and monitored on observation. Orthopedics consulted.   Subjective:   Discharge had early yesterday as patient was unable to get out of bed and not able to participate well with PT. Today his pain is much improved and was able to participate with PT.   Assessment  & Plan :   Right knee sprain/medial meniscal tear Pain better on current regimen. Prescriptions provided. Follow-up with Dr [name] in 1-2 weeks. Plan on elective total knee replacement. Home health and equipments provided. Please see discharge summary for details.     Family Communication  : Family at bedside  Disposition Plan  : Home with daughter    Lab Results  Component Value Date   PLT 248 10/09/2016    Antibiotics  :    Anti-infectives    None        Objective:   Vitals:   10/10/16 1444 10/10/16 2050 10/11/16 0623 10/11/16 0915  BP: 132/61 (!) 137/58 (!) 159/76 (!) 169/59    Pulse: 68 74 68 70  Resp: 18     Temp: 97.6 F (36.4 C) 98.7 F (37.1 C) 98.2 F (36.8 C)   TempSrc: Oral Oral Oral   SpO2: 92% 91% 92%     Wt Readings from Last 3 Encounters:  09/22/16 (!) 150.1 kg (331  lb)  08/21/16 (!) 145.2 kg (320 lb)  07/09/16 (!) 149.7 kg (330 lb)     Intake/Output Summary (Last 24 hours) at 10/11/16 1128 Last data filed at 10/11/16 0626  Gross per 24 hour  Intake              480 ml  Output             1150 ml  Net             -670 ml     Physical Exam  Gen: not in distress HEENT: moist mucosa, supple neck Chest: clear b/l, no added sounds CVS: N S1&S2, no murmurs GI: soft, NT, ND,  Musculoskeletal: warm, Mild swelling over right knee, limited ROM     Data Review:    CBC  Recent Labs Lab 10/09/16 1848  WBC 10.4  HGB 13.6  HCT 42.0  PLT 248  MCV 82.4  MCH 26.7  MCHC 32.4  RDW 15.1    Chemistries   Recent Labs Lab 10/09/16 1848  NA 141  K 3.5  CL 103  CO2 27  GLUCOSE 270*  BUN 15  CREATININE 1.08  CALCIUM 9.0   ------------------------------------------------------------------------------------------------------------------ No results for input(s): CHOL, HDL, LDLCALC, TRIG, CHOLHDL, LDLDIRECT in the last 72 hours.  Lab Results  Component Value Date   HGBA1C 8.9 (H) 10/09/2016   ------------------------------------------------------------------------------------------------------------------ No results for input(s): TSH, T4TOTAL, T3FREE, THYROIDAB in the last 72 hours.  Invalid input(s): FREET3 ------------------------------------------------------------------------------------------------------------------ No results for input(s): VITAMINB12, FOLATE, FERRITIN, TIBC, IRON, RETICCTPCT in the last 72 hours.  Coagulation profile No results for input(s): INR, PROTIME in the last 168 hours.  No results for input(s): DDIMER in the last 72 hours.  Cardiac Enzymes No results for input(s): CKMB, TROPONINI,  MYOGLOBIN in the last 168 hours.  Invalid input(s): CK ------------------------------------------------------------------------------------------------------------------    Component Value Date/Time   BNP 156.8 (H) 06/13/2016 1105    Inpatient Medications  Scheduled Meds: . amLODipine  10 mg Oral QPM  . aspirin EC  81 mg Oral Daily  . atorvastatin  10 mg Oral q1800  . carvedilol  6.25 mg Oral BID WC  . hydrALAZINE  25 mg Oral TID  . insulin aspart  0-15 Units Subcutaneous TID WC  . insulin aspart  0-5 Units Subcutaneous QHS  . losartan  100 mg Oral Daily  . pantoprazole  40 mg Oral Daily  . potassium chloride SA  20 mEq Oral BID  . rivaroxaban  20 mg Oral Q supper   Continuous Infusions: PRN Meds:.diclofenac sodium, oxyCODONE, traMADol  Micro Results No results found for this or any previous visit (from the past 240 hour(s)).  Radiology Reports Mr Knee Right Wo Contrast  Result Date: 10/09/2016 CLINICAL DATA:  Right knee pain and weakness. EXAM: MRI OF THE RIGHT KNEE WITHOUT CONTRAST TECHNIQUE: Multiplanar, multisequence MR imaging of the knee was performed. No intravenous contrast was administered. COMPARISON:  Right knee x-rays from same date. FINDINGS: MENISCI Medial meniscus: Tearing of the posterior root. Degenerative signal throughout the body and posterior horn with blunting of the body free edge Lateral meniscus: Linear degenerative signal throughout the body and posterior horn without discrete tear. LIGAMENTS Cruciates:  Intact ACL and PCL. Collaterals: The medial collateral ligament is intact. The iliotibial band and biceps femoris tendon are intact. There is increased fluid signal within the proximal fibular collateral ligament, consistent with partial tear. CARTILAGE Patellofemoral: Large marginal osteophytes. Large areas of full-thickness cartilage loss throughout the trochlea. There  is a partial-thickness cartilage loss over the patellar apex, with scattered areas of  abnormal degenerative signal in the patellar cartilage. Medial: Large marginal osteophytes. Large areas of full-thickness cartilage loss overlying the central weight-bearing medial femoral condyle. Lateral: Large marginal osteophytes. Significant to use thinning of the cartilage overlying the lateral femoral condyles. Large focal full-thickness cartilage defect along the mesial aspect of the lateral tibial plateau. Joint:  Moderate joint effusion. Popliteal Fossa:  No Baker cyst. Intact popliteus tendon. Extensor Mechanism: Intact quadriceps tendon and patellar tendon. Increased intermediate signal within the distal patellar tendon, likely representing tendinosis. Bones: No focal marrow signal abnormality. No fracture or dislocation. Other: None. IMPRESSION: 1. Degenerative signal throughout the body and posterior horn of the medial meniscus with tearing at the posterior root and free edge radial tearing of the body. 2. Degenerative signal throughout the body and posterior horn of the lateral meniscus without discrete tear. 3. Severe tricompartmental degenerative changes. Moderate joint effusion. 4. Partial tear of the proximal fibular collateral ligament. Electronically Signed   By: Titus Dubin M.D.   On: 10/09/2016 12:04   Dg Knee Complete 4 Views Left  Result Date: 10/09/2016 CLINICAL DATA:  Bilateral knee pain after fall today. EXAM: LEFT KNEE - COMPLETE 4+ VIEW COMPARISON:  None. FINDINGS: No acute fracture or dislocation. Sequela of prior ACL repair. Moderate tricompartmental osteoarthritis, prominent in the medial tibiofemoral patellofemoral compartments. Moderate joint effusion. Probable ossified intra-articular bodies posteriorly in the knee. IMPRESSION: 1. No acute fracture or subluxation of the left knee. 2. Postsurgical change with moderate osteoarthritis, joint effusion and probable intra-articular bodies posteriorly. Electronically Signed   By: Jeb Levering M.D.   On: 10/09/2016 00:38    Dg Knee Complete 4 Views Right  Result Date: 10/09/2016 CLINICAL DATA:  Bilateral knee pain after fall today. EXAM: RIGHT KNEE - COMPLETE 4+ VIEW COMPARISON:  Radiographs 08/21/2016 FINDINGS: No acute fracture or dislocation. Advanced tricompartmental osteoarthritis, unchanged from prior exam. Moderate knee joint effusion has increased. IMPRESSION: 1. No acute fracture or dislocation of the right knee pain. Two advanced tricompartmental osteoarthritis. Knee joint effusion which has increased from prior exam. Electronically Signed   By: Jeb Levering M.D.   On: 10/09/2016 00:39    Time Spent in minutes  25   Louellen Molder M.D on 10/11/2016 at 11:28 AM  Between 7am to 7pm - Pager - [phone]  After 7pm go to [email or web address] - password [facility]  [facility] -  Office  [phone]

## 2016-10-11 NOTE — Progress Notes (Signed)
Discharge instructions and prescriptions provided to patient.  Equipment delivered to room (wheelchair with seat cusion and bedside commode).  Patient declined front wheel walker, has one at home.  No questions at time of discharge.  Discharged home in care of wife and daughter; escorted via wheelchair by NT.

## 2016-10-11 NOTE — Progress Notes (Signed)
Notified by [facility] late this morning at 1153 that Mr Peace had requested an expedited appeal of his discharge yesterday. Patients in observation status do not have appeal rights. OK to discharge.

## 2016-10-11 NOTE — Progress Notes (Addendum)
Physical Therapy Treatment Patient Details Name: Thomas [name] MRN: [id number] DOB: Nov 17, 1950 Today's Date: 10/11/2016    History of Present Illness 67 y.o. male with medical history significant of arthritis, chronic kidney disease, type 2 diabetes, venous thrombus embolism, and hypertension who presents after a fall.  Pt with primary c/o severe R knee pain.  MRI was reviewed by ortho and determined to not reveal any acute issues. He mainly had a sprain of his severely arthritic knee. His meniscus tears appeared to be degenerative and chronic.     PT Comments    Pt much improved this session. Pt reporting decreased pain. Pt able to transfer bed to chair via stand-pivot transfer with RW. Family present during session. Pt/family educated on performing pivot transfers toward left, away from his painful R knee. Knee immobilizer not utilized this session in order to allow better use of RLE during sit to stand. Family reports they are comfortable with w/c management due to previously caring for another family member in a w/c. Pt instructed in ankle pumps to assist with BLE circulation. All education complete. Pt/family verbalize no further questions. Pt to d/c home today with 24-hour family assist.  Equipment recommendations updated. Pt reports having RW at home.   Follow Up Recommendations  Home health PT;Supervision/Assistance - 24 hour     Equipment Recommendations  Wheelchair cushion (measurements PT);Wheelchair (measurements PT);3in1 (PT)    Recommendations for Other Services       Precautions / Restrictions Precautions Precautions: Fall Required Braces or Orthoses: Knee Immobilizer - Right Knee Immobilizer - Right: Other (comment) (PRN for comfort) Restrictions RLE Weight Bearing: Weight bearing as tolerated    Mobility  Bed Mobility         Supine to sit: Min assist;HOB elevated        Transfers   Equipment used: Rolling walker (2 wheeled) Transfers: Stand Pivot  Transfers Sit to Stand: Mod assist;+2 safety/equipment Stand pivot transfers: Mod assist;+2 safety/equipment       General transfer comment: verbal cues for hand placement. Increased time to stabilize initial standing balance. Bilat knees and trunk flexed during transfer. Pt unable to correct even with cueing.   Ambulation/Gait             General Gait Details: unable due to pain   Stairs            Wheelchair Mobility    Modified Rankin (Stroke Patients Only)       Balance Overall balance assessment: History of Falls;Needs assistance Sitting-balance support: No upper extremity supported;Feet supported Sitting balance-Leahy Scale: Good     Standing balance support: Bilateral upper extremity supported;During functional activity Standing balance-Leahy Scale: Poor Standing balance comment: heavy reliance on RW                            Cognition Arousal/Alertness: Awake/alert Behavior During Therapy: Anxious Overall Cognitive Status: Within Functional Limits for tasks assessed                                        Exercises Total Joint Exercises Ankle Circles/Pumps: AROM;Both;20 reps    General Comments        Pertinent Vitals/Pain Pain Assessment: Faces Faces Pain Scale: Hurts little more Pain Location: R knee Pain Descriptors / Indicators: Sore;Grimacing;Guarding Pain Intervention(s): Monitored during session;Limited activity within patient's tolerance;Repositioned  Home Living                      Prior Function            PT Goals (current goals can now be found in the care plan section) Acute Rehab PT Goals Patient Stated Goal: home PT Goal Formulation: With patient/family Time For Goal Achievement: 10/17/16 Potential to Achieve Goals: Good Progress towards PT goals: Progressing toward goals    Frequency    Min 3X/week      PT Plan Equipment recommendations need to be updated     Co-evaluation              AM-PAC PT "6 Clicks" Daily Activity  Outcome Measure  Difficulty turning over in bed (including adjusting bedclothes, sheets and blankets)?: A Lot Difficulty moving from lying on back to sitting on the side of the bed? : Unable Difficulty sitting down on and standing up from a chair with arms (e.g., wheelchair, bedside commode, etc,.)?: Unable Help needed moving to and from a bed to chair (including a wheelchair)?: A Lot Help needed walking in hospital room?: Total Help needed climbing 3-5 steps with a railing? : Total 6 Click Score: 8    End of Session Equipment Utilized During Treatment: Gait belt Activity Tolerance: Patient tolerated treatment well Patient left: in chair;with call bell/phone within reach;with family/visitor present Nurse Communication: Mobility status PT Visit Diagnosis: Difficulty in walking, not elsewhere classified (R26.2);Pain Pain - Right/Left: Right Pain - part of body: Knee     Time: 1035-1059 PT Time Calculation (min) (ACUTE ONLY): 24 min  Charges:  $Therapeutic Activity: 23-37 mins                    G Codes:  Functional Assessment Tool Used: AM-PAC 6 Clicks Basic Mobility Functional Limitation: Mobility: Walking and moving around Mobility: Walking and Moving Around Current Status (G6269): At least 80 percent but less than 100 percent impaired, limited or restricted Mobility: Walking and Moving Around Goal Status [phone]): At least 60 percent but less than 80 percent impaired, limited or restricted Mobility: Walking and Moving Around Discharge Status [phone]): At least 80 percent but less than 100 percent impaired, limited or restricted    Lorrin Goodell, PT  Office # [phone] Pager [phone]    Lorriane Shire 10/11/2016, 11:29 AM

## 2016-10-11 NOTE — Progress Notes (Signed)
Much ado with this pt prior to re-eval with PT. Consult with Alvis Lemmings to assess if [facility] would be an option for this pt, however he does not have a rehabable condition and Bethena Roys feels not appropriate. HHPT will be in place. DME has been delivered and pt no longer needs Ambulance transfer. 65 family members in room explained miscommunications by multiple Healthcare professionals and CM apologized for the confusion. All necessary Medicare paperwork scrutinized thoroughly by all family members in the room prior to signing. No other CM needs at time of discharge.

## 2016-10-11 NOTE — Care Management Obs Status (Signed)
[facility] NOTIFICATION   Patient Details  Name: Thomas [name] MRN: [id number] Date of Birth: 1951/01/05   Medicare Observation Status Notification Given:  Yes    Thomas Haste, RN 10/11/2016, 11:17 AM

## 2016-10-12 ENCOUNTER — Telehealth: Payer: Self-pay

## 2016-10-12 NOTE — Telephone Encounter (Signed)
Transition Care Management Follow-Up Telephone Call   Date discharged and where: [facility] on 10/11/16  How have you been since you were released from the hospital? Sore L leg up to hip. R leg still  Any patient concerns? No  Items Reviewed:   Meds: yes  Allergies:yes  Dietary Changes Reviewed:  Functional Questionnaire:  Independent-I Dependent-D  ADLs:   Dressing- I with assist    Eating- I   Maintaining continence- D, catheter   Transferring- assist of wheelchair   Transportation- D   Meal Prep- D   Managing Meds- I with assist  Confirmed importance and Date/Time of follow-up visits scheduled: 10/22/16 @ 10:15 am Thomas Mustache NP   Confirmed with patient if condition worsens to call PCP or go to the Emergency Dept. Patient was given office number and encouraged to call back with questions or concerns: Yes

## 2016-10-13 MED FILL — oxyCODONE HCL 5 MG TABS: 5 | 3 days supply | Qty: 30 | Fill #0

## 2016-10-13 MED FILL — AMLODIPINE BESYLATE 10 MG T: 10 | 90 days supply | Qty: 90 | Fill #1

## 2016-10-13 MED FILL — POTASSIUM CL ER 20 MEQ TABL: 20 | 45 days supply | Qty: 45 | Fill #2

## 2016-10-13 MED FILL — JARDIANCE 10 MG TABLET: 10 | 30 days supply | Qty: 30 | Fill #4

## 2016-10-13 MED FILL — traMADol HCL 50 MG TABS: 50 | 8 days supply | Qty: 30 | Fill #0

## 2016-10-15 DIAGNOSIS — M1711 Unilateral primary osteoarthritis, right knee: Secondary | ICD-10-CM | POA: Diagnosis not present

## 2016-10-15 DIAGNOSIS — E1122 Type 2 diabetes mellitus with diabetic chronic kidney disease: Secondary | ICD-10-CM | POA: Diagnosis not present

## 2016-10-15 DIAGNOSIS — Z86718 Personal history of other venous thrombosis and embolism: Secondary | ICD-10-CM | POA: Diagnosis not present

## 2016-10-15 DIAGNOSIS — S83203D Other tear of unspecified meniscus, current injury, right knee, subsequent encounter: Secondary | ICD-10-CM | POA: Diagnosis not present

## 2016-10-15 DIAGNOSIS — N189 Chronic kidney disease, unspecified: Secondary | ICD-10-CM | POA: Diagnosis not present

## 2016-10-15 DIAGNOSIS — I129 Hypertensive chronic kidney disease with stage 1 through stage 4 chronic kidney disease, or unspecified chronic kidney disease: Secondary | ICD-10-CM | POA: Diagnosis not present

## 2016-10-15 DIAGNOSIS — Z86711 Personal history of pulmonary embolism: Secondary | ICD-10-CM | POA: Diagnosis not present

## 2016-10-15 DIAGNOSIS — Z7984 Long term (current) use of oral hypoglycemic drugs: Secondary | ICD-10-CM | POA: Diagnosis not present

## 2016-10-19 DIAGNOSIS — E1122 Type 2 diabetes mellitus with diabetic chronic kidney disease: Secondary | ICD-10-CM | POA: Diagnosis not present

## 2016-10-19 DIAGNOSIS — S83203D Other tear of unspecified meniscus, current injury, right knee, subsequent encounter: Secondary | ICD-10-CM | POA: Diagnosis not present

## 2016-10-19 DIAGNOSIS — I129 Hypertensive chronic kidney disease with stage 1 through stage 4 chronic kidney disease, or unspecified chronic kidney disease: Secondary | ICD-10-CM | POA: Diagnosis not present

## 2016-10-19 DIAGNOSIS — M1711 Unilateral primary osteoarthritis, right knee: Secondary | ICD-10-CM | POA: Diagnosis not present

## 2016-10-19 DIAGNOSIS — N189 Chronic kidney disease, unspecified: Secondary | ICD-10-CM | POA: Diagnosis not present

## 2016-10-21 DIAGNOSIS — S83203D Other tear of unspecified meniscus, current injury, right knee, subsequent encounter: Secondary | ICD-10-CM | POA: Diagnosis not present

## 2016-10-21 DIAGNOSIS — E1122 Type 2 diabetes mellitus with diabetic chronic kidney disease: Secondary | ICD-10-CM | POA: Diagnosis not present

## 2016-10-21 DIAGNOSIS — N189 Chronic kidney disease, unspecified: Secondary | ICD-10-CM | POA: Diagnosis not present

## 2016-10-21 DIAGNOSIS — I129 Hypertensive chronic kidney disease with stage 1 through stage 4 chronic kidney disease, or unspecified chronic kidney disease: Secondary | ICD-10-CM | POA: Diagnosis not present

## 2016-10-21 DIAGNOSIS — M1711 Unilateral primary osteoarthritis, right knee: Secondary | ICD-10-CM | POA: Diagnosis not present

## 2016-10-22 ENCOUNTER — Ambulatory Visit (INDEPENDENT_AMBULATORY_CARE_PROVIDER_SITE_OTHER): Payer: Medicare Other | Admitting: Nurse Practitioner

## 2016-10-22 ENCOUNTER — Encounter: Payer: Self-pay | Admitting: Nurse Practitioner

## 2016-10-22 VITALS — BP 148/80 | HR 68 | Temp 98.0°F | Wt 321.0 lb

## 2016-10-22 DIAGNOSIS — M170 Bilateral primary osteoarthritis of knee: Secondary | ICD-10-CM | POA: Diagnosis not present

## 2016-10-22 DIAGNOSIS — E1122 Type 2 diabetes mellitus with diabetic chronic kidney disease: Secondary | ICD-10-CM | POA: Diagnosis not present

## 2016-10-22 NOTE — Patient Instructions (Signed)
Follow up in 3 months To get blood work (A1C) before appt so we will have results.

## 2016-10-22 NOTE — Progress Notes (Signed)
Careteam: Patient Care Team: [name], NP as PCP - General (Geriatric Medicine)    Allergies  Allergen Reactions  . Lisinopril Cough    Chief Complaint  Patient presents with  . Transitions Of Care    10/09/2016 - 10/11/2016 (2 days     HPI: Patient is a 66 y.o. male seen in the office today for hospital follow up.  Pt with hx of chronic arthritis, type 2 diabetes mellitus, DVT on anticoagulation hypertension who went to the hospital after a fall . Patient has had pain in his right knee for the past few months. In the ED MRI of the knee was done which showed medial meniscus tear and severe tricompartmental degenerative changes.  Patient was unable to walk even with crutches due to pain; he was admitted to hospital and Orthopedics consulted. Seen by orthopedics who reviewed the MRI and suggested he likely had a sprain of his severely arthritic knee. His meniscus tear seems to be degenerative and chronic.Recommend total knee replacement electively once pain and his other corbidities are better controlled. PT was consulted and arranged for outpatient.  He is doing this at home and going well. Pain is 3/10.  He was given Prescribed oxycodone and tramadol prn for pain. Has taken all the oxycodone and using tramadol which is controlling pain. He was to continue diclofenac gel topically and follow up with Dr [name] in 1-2 weeks.   A1c at 9.2 one month ago.  Taking glipizide 5 mg daily, metformin 500 mg by mouth twice daily, jardiance 10 mg by mouth daily  Blood sugars ranging from 68-256 (mostly below 200) has made dietary changes however some diet modifications are an issue due to his wife's kidney disease and on HD.  Review of Systems:  Review of Systems  Constitutional: Negative for chills, fever and weight loss.  HENT: Negative for tinnitus.   Respiratory: Positive for shortness of breath (chronic with increase in activity). Negative for cough and sputum production.     Cardiovascular: Negative for chest pain, palpitations and leg swelling.  Gastrointestinal: Negative for abdominal pain, constipation, diarrhea and heartburn.  Genitourinary: Negative for dysuria, frequency and urgency.  Musculoskeletal: Positive for joint pain (knee). Negative for back pain, falls and myalgias.  Skin: Negative.   Neurological: Positive for dizziness (this morning). Negative for headaches.  Psychiatric/Behavioral: Positive for depression (at times). Negative for memory loss. The patient does not have insomnia.     Past Medical History:  Diagnosis Date  . Arthritis   . Cancer [facility]) 2010   Prostate  . Chronic kidney disease   . Diabetes mellitus   . DVT (deep vein thrombosis) in pregnancy ([place])   . Genetic testing 06/22/2016   Mr. Brooks underwent genetic counseling and testing for hereditary cancer syndromes on 05/14/2016. His results were negative for mutations in all 46 genes analyzed by [facility]'s 46-gene Common Hereditary Cancers Panel. Genes analyzed include: APC, ATM, AXIN2, BARD1, BMPR1A, BRCA1, BRCA2, BRIP1, CDH1, CDKN2A, CHEK2, CTNNA1, DICER1, EPCAM, GREM1, HOXB13, KIT, MEN1, MLH1, MSH2, MSH3, MSH6, MUTYH, NB  . Hypertension   . PE (pulmonary thromboembolism) ([place])    Past Surgical History:  Procedure Laterality Date  . HERNIA REPAIR    . KNEE SURGERY    . PROSTATE SURGERY    . RADIOACTIVE SEED IMPLANT    . SHOULDER SURGERY    . uretha surgery-2014     Social History:   reports that he has never smoked. He has never used smokeless  tobacco. He reports that he does not drink alcohol or use drugs.  Family History  Problem Relation Age of Onset  . Breast cancer Mother 35       d.89  . Breast cancer Sister 13       d.30  . Leukemia Brother 18       d.20  . Breast cancer Maternal Aunt 40       d.40s  . Lung cancer Maternal Uncle   . Prostate cancer Paternal Uncle   . Prostate cancer Brother        recurred recently at age 85  . Other Brother 15        spinal tumor  . Cervical cancer Other 22       d.22  . Cancer Sister 13       unspecified type  . Cancer Maternal Uncle 80       unspecified type    Medications: Patient's Medications  New Prescriptions   No medications on file  Previous Medications   AMLODIPINE (NORVASC) 10 MG TABLET    Take 1 tablet (10 mg total) by mouth daily. For high blood pressure   ASPIRIN EC 81 MG TABLET    Take 81 mg by mouth daily.   ATORVASTATIN (LIPITOR) 10 MG TABLET    Take one tablet by mouth once daily for cholesterol   CARVEDILOL (COREG) 6.25 MG TABLET    Take 1 tablet (6.25 mg total) by mouth 2 (two) times daily with a meal. For high blood pressure   DICLOFENAC SODIUM (VOLTAREN) 1 % GEL    Apply 2 g topically 4 (four) times daily.   EMPAGLIFLOZIN (JARDIANCE) 10 MG TABS TABLET    Take 10 mg by mouth daily.   GLIPIZIDE (GLUCOTROL) 5 MG TABLET    Take 1 tablet (5 mg total) by mouth daily before breakfast.   HYDRALAZINE (APRESOLINE) 25 MG TABLET    Take 1 tablet (25 mg total) by mouth 3 (three) times daily.   LOSARTAN (COZAAR) 100 MG TABLET    Take 1 tablet (100 mg total) by mouth daily.   METFORMIN (GLUCOPHAGE) 500 MG TABLET    TAKE 1 TABLET BY MOUTH 2 TIMES A DAY WITH A MEAL for diabetes   OMEPRAZOLE (PRILOSEC) 20 MG CAPSULE    Take 1 capsule (20 mg total) by mouth as needed.   OXYCODONE (OXY IR/ROXICODONE) 5 MG IMMEDIATE RELEASE TABLET    Take 2 tablets (10 mg total) by mouth every 4 (four) hours as needed for moderate pain.   POTASSIUM CHLORIDE SA (K-DUR,KLOR-CON) 20 MEQ TABLET    Take 20 mEq by mouth 2 (two) times daily.   RIVAROXABAN (XARELTO) 20 MG TABS TABLET    Take 1 tablet (20 mg total) by mouth daily with supper.   TRAMADOL (ULTRAM) 50 MG TABLET    Take 1 tablet (50 mg total) by mouth every 6 (six) hours as needed. for pain   TRUE METRIX BLOOD GLUCOSE TEST TEST STRIP    CHECK BLOOD SUGAR TWICE PER DAY   TRUEPLUS LANCETS 30G MISC    USE TO TEST BLOOD SUGAR 2 TIMES A DAY  Modified Medications    No medications on file  Discontinued Medications   No medications on file     Physical Exam:  Vitals:   10/22/16 1015  BP: (!) 148/80  Pulse: 68  Temp: 98 F (36.7 C)  TempSrc: Oral  SpO2: 94%  Weight: (!) 321 lb (145.6 kg)   Body  mass index is 47.4 kg/m.  Physical Exam  Constitutional: He is oriented to person, place, and time. He appears well-developed and well-nourished.  Obese male, NAD  HENT:  Head: Normocephalic and atraumatic.  Eyes: Pupils are equal, round, and reactive to light.  Neck: Normal range of motion. Neck supple. Carotid bruit is not present.  Cardiovascular: Normal rate, regular rhythm and normal heart sounds.   Pulmonary/Chest: Effort normal and breath sounds normal. No respiratory distress. He has no wheezes.  Abdominal: Soft. Bowel sounds are normal. He exhibits no abdominal bruit and no pulsatile midline mass.  Musculoskeletal: He exhibits tenderness. He exhibits no edema.       Right knee: He exhibits decreased range of motion. Tenderness found.       Left knee: He exhibits decreased range of motion. Tenderness found.  In [place] today.   Neurological: He is alert and oriented to person, place, and time. No sensory deficit.  Skin: Skin is warm and dry.  Psychiatric: He has a normal mood and affect. His behavior is normal. Judgment and thought content normal.    Labs reviewed: Basic Metabolic Panel:  Recent Labs  11/25/15 0153  07/09/16 1154 09/22/16 1032 10/09/16 1848  NA  --   < > 143 141 141  K  --   < > 3.6 4.0 3.5  CL  --   < > 103 101 103  CO2  --   < > _0 GLUCOSE  --   < > 201* 270* 270*  BUN  --   < > _1 CREATININE  --   < > 1.3* 1.04 1.08  CALCIUM  --   < > 8.9 9.1 9.0  MG 2.0  --   --   --   --   < > = values in this interval not displayed. Liver Function Tests:  Recent Labs  04/07/16 1130 07/09/16 1154 09/22/16 1032  AST 14 18 8*  ALT 21 26 8*  ALKPHOS 73 101* 91  BILITOT 0.71 0.80 0.6  PROT 7.9 8.2*  7.2  ALBUMIN 3.7 3.7 3.8   No results for input(s): LIPASE, AMYLASE in the last 8760 hours. No results for input(s): AMMONIA in the last 8760 hours. CBC:  Recent Labs  04/07/16 1130  07/09/16 1154 09/22/16 1032 10/09/16 1848  WBC 8.7  < > 8.9 7.1 10.4  NEUTROABS 6.2  --  6.6* 5,325  --   HGB 14.5  < > 15.1 14.3 13.6  HCT 44.4  < > 46.6 46.0 42.0  MCV 82  < > 84 85.3 82.4  PLT 310  < > 298 285 248  < > = values in this interval not displayed. Lipid Panel:  Recent Labs  09/22/16 1032  CHOL 193  HDL 62  LDLCALC 112*  TRIG 95  CHOLHDL 3.1   TSH: No results for input(s): TSH in the last 8760 hours. A1C: Lab Results  Component Value Date   HGBA1C 8.9 (H) 10/09/2016     Assessment/Plan 1. Type 2 diabetes mellitus with chronic kidney disease, without long-term current use of insulin, unspecified CKD stage (HCC) Blood sugars reviewed and improving. To cont dietary changes and will follow up A1c in 3 months.  - Hemoglobin A1c; Future  2. Primary osteoarthritis of both knees Working with PT to help with pain and mobility. Ortho recommended knee replacement. Has follow up with ortho next week. Pain is well controlled on current regimen  Next appt: 3  months.  Thomas [name]. Harle Battiest  [facility] [phone] 8 am - 5 pm) [phone] (after hours)

## 2016-10-23 ENCOUNTER — Telehealth: Payer: Self-pay | Admitting: *Deleted

## 2016-10-23 NOTE — Telephone Encounter (Signed)
Someone picks up and hangs up times 2. Will try again later.

## 2016-10-23 NOTE — Telephone Encounter (Signed)
Patient stated it only last a few seconds. He stated he will try the exercises first.

## 2016-10-23 NOTE — Telephone Encounter (Signed)
Yes, I agree, also how long do the episodes last? If only for a few minutes/seconds medication may not be beneficial but these exercises will help If lasting 30 mins or greater can try meclizine 12.5-25 mg by mouth every 8 hours as needed which is over the counter.

## 2016-10-23 NOTE — Telephone Encounter (Signed)
Patient called and stated that he was seen yesterday but ever since this Wednesday he has been waking up with Dizziness and Nausea. Patient is requesting something for it. Only happens when he wakes up in the morning and lifts his head, Feels like the room is spinning. Please Advise.   Dr. Linna Darner was near by while speaking with patient and he recommended patient to look up Benign Positional Vertigo exercises and try it until Thomas [name] responds. Patient agreed.

## 2016-10-26 DIAGNOSIS — E1122 Type 2 diabetes mellitus with diabetic chronic kidney disease: Secondary | ICD-10-CM | POA: Diagnosis not present

## 2016-10-26 DIAGNOSIS — I129 Hypertensive chronic kidney disease with stage 1 through stage 4 chronic kidney disease, or unspecified chronic kidney disease: Secondary | ICD-10-CM | POA: Diagnosis not present

## 2016-10-26 DIAGNOSIS — M1711 Unilateral primary osteoarthritis, right knee: Secondary | ICD-10-CM | POA: Diagnosis not present

## 2016-10-26 DIAGNOSIS — N189 Chronic kidney disease, unspecified: Secondary | ICD-10-CM | POA: Diagnosis not present

## 2016-10-26 DIAGNOSIS — S83203D Other tear of unspecified meniscus, current injury, right knee, subsequent encounter: Secondary | ICD-10-CM | POA: Diagnosis not present

## 2016-10-27 ENCOUNTER — Encounter (INDEPENDENT_AMBULATORY_CARE_PROVIDER_SITE_OTHER): Payer: Self-pay | Admitting: Orthopaedic Surgery

## 2016-10-27 ENCOUNTER — Ambulatory Visit (INDEPENDENT_AMBULATORY_CARE_PROVIDER_SITE_OTHER): Payer: Medicare Other | Admitting: Orthopaedic Surgery

## 2016-10-27 VITALS — Ht 69.0 in | Wt 322.0 lb

## 2016-10-27 DIAGNOSIS — E1122 Type 2 diabetes mellitus with diabetic chronic kidney disease: Secondary | ICD-10-CM | POA: Diagnosis not present

## 2016-10-27 DIAGNOSIS — M1711 Unilateral primary osteoarthritis, right knee: Secondary | ICD-10-CM | POA: Diagnosis not present

## 2016-10-27 NOTE — Progress Notes (Signed)
Office Visit Note   Patient: Thomas [name]           Date of Birth: [birth year]           MRN: [id number] Visit Date: 10/27/2016              Requested by: [name], NP [place], [place] [address] PCP: [name], NP   Assessment & Plan: Visit Diagnoses:  1. Primary osteoarthritis of right knee     Plan: Patient is now barely even able to walk without crutches. He is ready to have a knee replacement. He understands that his increased BMI is a risk. With his history of DVT and PE he is also at risk for developing more these from the surgery. He will need to stop his xarelto 2-3 days before surgery.  We will check a hemoglobin A1c today which I expect to be trending downward but would not be below 8.0. I'll see him back and one more month for recheck of hemoglobin A1c.  Follow-Up Instructions: Return in about 4 weeks (around 11/24/2016).   Orders:  No orders of the defined types were placed in this encounter.  No orders of the defined types were placed in this encounter.     Procedures: No procedures performed   Clinical Data: No additional findings.   Subjective: Chief Complaint  Patient presents with  . Right Knee - Pain  . Left Knee - Pain    Patient follows up from his recent hospital admission for acute exacerbation of his right knee. MRI showed no acute findings but did show advanced degenerative joint disease. He has been ambulating with crutches and has significant pain. He has significant decline quality of life and ADLs. He has been losing weight and has been monitoring her sugars better. He has a history of DVT and PE which he is on xarelto.  At this point he is ready for a knee replacement.    Review of Systems  Constitutional: Negative.   All other systems reviewed and are negative.    Objective: Vital Signs: Ht _0  (1.753 m)   Wt (!) 322 lb (146.1 kg)   BMI 47.55 kg/m   Physical Exam  Constitutional: He is  oriented to person, place, and time. He appears well-developed and well-nourished.  HENT:  Head: Normocephalic and atraumatic.  Eyes: Pupils are equal, round, and reactive to light.  Neck: Neck supple.  Pulmonary/Chest: Effort normal.  Abdominal: Soft.  Musculoskeletal: Normal range of motion.  Neurological: He is alert and oriented to person, place, and time.  Skin: Skin is warm.  Psychiatric: He has a normal mood and affect. His behavior is normal. Judgment and thought content normal.  Nursing note and vitals reviewed.   Ortho Exam Right knee exam shows no joint effusion. He does not have a large soft tissue envelope. He carries most of his weight and his trunk and upper body. His severe pain with weightbearing for which she uses crutches  Specialty Comments:  No specialty comments available.  Imaging: No results found.   PMFS History: Patient Active Problem List   Diagnosis Date Noted  . Meniscal injury 10/09/2016  . Hypercoagulable state ([place]) 09/22/2016  . Genetic testing 06/22/2016  . DVT, lower extremity, proximal, acute, left ([place]) 01/23/2016  . Hematuria 12/12/2015  . Pulmonary embolism ([place]) 11/23/2015  . Type 2 diabetes mellitus with chronic kidney disease, without long-term current use of insulin ([place]) 12/05/2014  . Benign  prostatic hyperplasia with urinary obstruction 07/25/2014  . Urine retention 07/25/2014  . Urinary tract infection 07/09/2014  . Anemia, iron deficiency 11/22/2013  . Urinary retention 11/01/2013  . Incomplete emptying of bladder 07/26/2013  . Nocturia 07/26/2013  . Other nonspecific finding on examination of urine 07/26/2013  . Sleep apnea 07/26/2013  . Urethral stricture 07/26/2013  . Malignant neoplasm of prostate ([place]) 07/04/2013  . Bladder neck contracture 07/04/2013  . Severe obesity (BMI >= 40) ([place]) 05/23/2013  . B12 deficiency 05/23/2013  . Hyperlipidemia with target LDL less than 100 09/02/2012  . HTN (hypertension) 05/26/2012    . Osteoarthritis, knee 05/26/2012  . MORBID OBESITY 08/22/2008   Past Medical History:  Diagnosis Date  . Arthritis   . Cancer [facility]) 2010   Prostate  . Chronic kidney disease   . Diabetes mellitus   . DVT (deep vein thrombosis) in pregnancy ([place])   . Genetic testing 06/22/2016   Mr. Hageman underwent genetic counseling and testing for hereditary cancer syndromes on 05/14/2016. His results were negative for mutations in all 46 genes analyzed by [facility]'s 46-gene Common Hereditary Cancers Panel. Genes analyzed include: APC, ATM, AXIN2, BARD1, BMPR1A, BRCA1, BRCA2, BRIP1, CDH1, CDKN2A, CHEK2, CTNNA1, DICER1, EPCAM, GREM1, HOXB13, KIT, MEN1, MLH1, MSH2, MSH3, MSH6, MUTYH, NB  . Hypertension   . PE (pulmonary thromboembolism) (HCC)     Family History  Problem Relation Age of Onset  . Breast cancer Mother 50       d.89  . Breast cancer Sister 95       d.30  . Leukemia Brother 18       d.20  . Breast cancer Maternal Aunt 40       d.40s  . Lung cancer Maternal Uncle   . Prostate cancer Paternal Uncle   . Prostate cancer Brother        recurred recently at age 57  . Other Brother 15       spinal tumor  . Cervical cancer Other 22       d.22  . Cancer Sister 49       unspecified type  . Cancer Maternal Uncle 80       unspecified type    Past Surgical History:  Procedure Laterality Date  . HERNIA REPAIR    . KNEE SURGERY    . PROSTATE SURGERY    . RADIOACTIVE SEED IMPLANT    . SHOULDER SURGERY    . uretha surgery-2014     Social History   Occupational History  . Not on file.   Social History Main Topics  . Smoking status: Never Smoker  . Smokeless tobacco: Never Used  . Alcohol use No     Comment: never  . Drug use: No  . Sexual activity: Yes

## 2016-10-27 NOTE — Addendum Note (Signed)
Addended by: Precious Bard on: 10/27/2016 11:09 AM   Modules accepted: Orders

## 2016-10-28 DIAGNOSIS — E1122 Type 2 diabetes mellitus with diabetic chronic kidney disease: Secondary | ICD-10-CM | POA: Diagnosis not present

## 2016-10-28 DIAGNOSIS — M1711 Unilateral primary osteoarthritis, right knee: Secondary | ICD-10-CM | POA: Diagnosis not present

## 2016-10-28 DIAGNOSIS — N189 Chronic kidney disease, unspecified: Secondary | ICD-10-CM | POA: Diagnosis not present

## 2016-10-28 DIAGNOSIS — S83203D Other tear of unspecified meniscus, current injury, right knee, subsequent encounter: Secondary | ICD-10-CM | POA: Diagnosis not present

## 2016-10-28 DIAGNOSIS — I129 Hypertensive chronic kidney disease with stage 1 through stage 4 chronic kidney disease, or unspecified chronic kidney disease: Secondary | ICD-10-CM | POA: Diagnosis not present

## 2016-10-28 LAB — HEMOGLOBIN A1C
Hgb A1c MFr Bld: 8 % of total Hgb — ABNORMAL HIGH (ref <5.7)
Mean Plasma Glucose: 183 (calc)
eAG (mmol/L): 10.1 (calc)

## 2016-10-28 NOTE — Progress Notes (Signed)
Can you let him know that A1c is 8.0 which is ok for surgery.  He needs to continue to control his sugars.  Let's go ahead and schedule him for whenever he wants

## 2016-10-30 DIAGNOSIS — S83203D Other tear of unspecified meniscus, current injury, right knee, subsequent encounter: Secondary | ICD-10-CM | POA: Diagnosis not present

## 2016-10-30 DIAGNOSIS — N189 Chronic kidney disease, unspecified: Secondary | ICD-10-CM | POA: Diagnosis not present

## 2016-10-30 DIAGNOSIS — M1711 Unilateral primary osteoarthritis, right knee: Secondary | ICD-10-CM | POA: Diagnosis not present

## 2016-10-30 DIAGNOSIS — E1122 Type 2 diabetes mellitus with diabetic chronic kidney disease: Secondary | ICD-10-CM | POA: Diagnosis not present

## 2016-10-30 DIAGNOSIS — I129 Hypertensive chronic kidney disease with stage 1 through stage 4 chronic kidney disease, or unspecified chronic kidney disease: Secondary | ICD-10-CM | POA: Diagnosis not present

## 2016-11-02 ENCOUNTER — Telehealth: Payer: Self-pay | Admitting: *Deleted

## 2016-11-02 NOTE — Telephone Encounter (Signed)
Great - thanks

## 2016-11-02 NOTE — Telephone Encounter (Signed)
Patient called and stated that he just wanted to let you know that he had his Hemoglobin A1C done at [facility] and it was 8.0.

## 2016-11-04 DIAGNOSIS — N189 Chronic kidney disease, unspecified: Secondary | ICD-10-CM | POA: Diagnosis not present

## 2016-11-04 DIAGNOSIS — M1711 Unilateral primary osteoarthritis, right knee: Secondary | ICD-10-CM | POA: Diagnosis not present

## 2016-11-04 DIAGNOSIS — I129 Hypertensive chronic kidney disease with stage 1 through stage 4 chronic kidney disease, or unspecified chronic kidney disease: Secondary | ICD-10-CM | POA: Diagnosis not present

## 2016-11-04 DIAGNOSIS — E1122 Type 2 diabetes mellitus with diabetic chronic kidney disease: Secondary | ICD-10-CM | POA: Diagnosis not present

## 2016-11-04 DIAGNOSIS — S83203D Other tear of unspecified meniscus, current injury, right knee, subsequent encounter: Secondary | ICD-10-CM | POA: Diagnosis not present

## 2016-11-05 DIAGNOSIS — M1711 Unilateral primary osteoarthritis, right knee: Secondary | ICD-10-CM | POA: Diagnosis not present

## 2016-11-05 DIAGNOSIS — I129 Hypertensive chronic kidney disease with stage 1 through stage 4 chronic kidney disease, or unspecified chronic kidney disease: Secondary | ICD-10-CM | POA: Diagnosis not present

## 2016-11-05 DIAGNOSIS — S83203D Other tear of unspecified meniscus, current injury, right knee, subsequent encounter: Secondary | ICD-10-CM | POA: Diagnosis not present

## 2016-11-05 DIAGNOSIS — E1122 Type 2 diabetes mellitus with diabetic chronic kidney disease: Secondary | ICD-10-CM | POA: Diagnosis not present

## 2016-11-05 DIAGNOSIS — N189 Chronic kidney disease, unspecified: Secondary | ICD-10-CM | POA: Diagnosis not present

## 2016-11-09 NOTE — Pre-Procedure Instructions (Addendum)
Thomas [name]  11/09/2016      [place], [place] - [address] [facility]. [address] [place] [address] Phone: [phone] Fax: [place], [place] - [address] [facility] [address] Phone: [phone] Fax: [phone]  [facility] [place], [place] - [address] [place] [address] Phone: [phone] Fax: [phone]    Your procedure is scheduled on Thursday, November 19, 2016  Report to [facility] Admitting at 12:10 P.M.  Call this number if you have problems the morning of surgery:  [phone]   Remember: Follow doctors instructions regarding rivaroxaban (XARELTO) and Aspirin.  Do not eat food or drink liquids after midnight Wednesday, November 18, 2016  Take these medicines the morning of surgery with A SIP OF WATER :amLODipine (NORVASC),carvedilol (COREG),hydrALAZINE (APRESOLINE),  omeprazole (PRILOSEC), if needed pain medication Stop taking  vitamins, fish oil and herbal medications. Do not take any NSAIDs ie: Ibuprofen, Advil, Naproxen Aleve), Motrin,diclofenac sodium (VOLTAREN)  GEL, BC and Goody Powder; stop Thursday, November 12, 2016.    How to Manage Your Diabetes Before and After Surgery  Why is it important to control my blood sugar before and after surgery? . Improving blood sugar levels before and after surgery helps healing and can limit problems. . A way of improving blood sugar control is eating a healthy diet by: o  Eating less sugar and carbohydrates o  Increasing activity/exercise o  Talking with your doctor about reaching your blood sugar goals . High blood sugars (greater than 180 mg/dL) can raise your risk of infections and slow your recovery, so you will need to focus on controlling your diabetes during the  weeks before surgery. . Make sure that the doctor who takes care of your diabetes knows about your planned surgery including the date and location.  How do I manage my blood sugar before surgery? . Check your blood sugar at least 4 times a day, starting 2 days before surgery, to make sure that the level is not too high or low. o Check your blood sugar the morning of your surgery when you wake up and every 2 hours until you get to the Short Stay unit. . If your blood sugar is less than 70 mg/dL, you will need to treat for low blood sugar: o Do not take insulin. o Treat a low blood sugar (less than 70 mg/dL) with  cup of clear juice (cranberry or apple), 4 glucose tablets, OR glucose gel. o Recheck blood sugar in 15 minutes after treatment (to make sure it is greater than 70 mg/dL). If your blood sugar is not greater than 70 mg/dL on recheck, call [phone] for further instructions. . Report your blood sugar to the short stay nurse when you get to Short Stay.  . If you are admitted to the hospital after surgery: o Your blood sugar will be checked by the staff and you will probably be given insulin after surgery (instead of oral diabetes medicines) to make sure you have good blood sugar levels. o The goal for blood sugar control after surgery is 80-180 mg/dL  WHAT DO I DO ABOUT MY DIABETES MEDICATION?   Marland Kitchen Do not take oral diabetes medicines (pills) the morning of surgery such as glipiZIDE (GLUCOTROL), metFORMIN (GLUCOPHAGE), empagliflozin (JARDIANCE)  Reviewed and Endorsed by [facility] Patient Education Committee, August 2015  Do not wear jewelry, make-up or nail polish.  Do not wear lotions, powders, or perfumes, or deoderant.  Do not shave 48 hours prior to surgery.  Men may shave face and neck.  Do not bring valuables to the hospital.  [facility] is not responsible for any belongings or valuables.  Contacts, dentures or bridgework may not be worn into surgery.  Leave your suitcase  in the car.  After surgery it may be brought to your room.  For patients admitted to the hospital, discharge time will be determined by your treatment team. Special instructions:Shower the night before surgery and the morning of surgery with CHG. Please read over the following fact sheets that you were given. Pain Booklet, Coughing and Deep Breathing, Total Joint Packet, MRSA Information and Surgical Site Infection Prevention

## 2016-11-10 ENCOUNTER — Encounter (HOSPITAL_COMMUNITY)
Admission: RE | Admit: 2016-11-10 | Discharge: 2016-11-10 | Disposition: A | Discharge status: Home / Self Care | Payer: Medicare Other | Source: Ambulatory Visit | Attending: Orthopaedic Surgery | Admitting: Orthopaedic Surgery

## 2016-11-10 ENCOUNTER — Encounter (HOSPITAL_COMMUNITY): Payer: Self-pay

## 2016-11-10 ENCOUNTER — Other Ambulatory Visit (INDEPENDENT_AMBULATORY_CARE_PROVIDER_SITE_OTHER): Payer: Self-pay | Admitting: Orthopaedic Surgery

## 2016-11-10 DIAGNOSIS — C61 Malignant neoplasm of prostate: Secondary | ICD-10-CM | POA: Insufficient documentation

## 2016-11-10 DIAGNOSIS — Z86718 Personal history of other venous thrombosis and embolism: Secondary | ICD-10-CM | POA: Diagnosis not present

## 2016-11-10 DIAGNOSIS — E1122 Type 2 diabetes mellitus with diabetic chronic kidney disease: Secondary | ICD-10-CM | POA: Insufficient documentation

## 2016-11-10 DIAGNOSIS — G4733 Obstructive sleep apnea (adult) (pediatric): Secondary | ICD-10-CM | POA: Diagnosis not present

## 2016-11-10 DIAGNOSIS — N189 Chronic kidney disease, unspecified: Secondary | ICD-10-CM | POA: Insufficient documentation

## 2016-11-10 DIAGNOSIS — Z01812 Encounter for preprocedural laboratory examination: Secondary | ICD-10-CM | POA: Diagnosis not present

## 2016-11-10 DIAGNOSIS — K219 Gastro-esophageal reflux disease without esophagitis: Secondary | ICD-10-CM | POA: Insufficient documentation

## 2016-11-10 DIAGNOSIS — I10 Essential (primary) hypertension: Secondary | ICD-10-CM | POA: Diagnosis not present

## 2016-11-10 DIAGNOSIS — Z79899 Other long term (current) drug therapy: Secondary | ICD-10-CM | POA: Insufficient documentation

## 2016-11-10 DIAGNOSIS — E119 Type 2 diabetes mellitus without complications: Secondary | ICD-10-CM | POA: Diagnosis not present

## 2016-11-10 DIAGNOSIS — I129 Hypertensive chronic kidney disease with stage 1 through stage 4 chronic kidney disease, or unspecified chronic kidney disease: Secondary | ICD-10-CM | POA: Insufficient documentation

## 2016-11-10 DIAGNOSIS — Z7982 Long term (current) use of aspirin: Secondary | ICD-10-CM | POA: Insufficient documentation

## 2016-11-10 DIAGNOSIS — M1711 Unilateral primary osteoarthritis, right knee: Secondary | ICD-10-CM

## 2016-11-10 HISTORY — DX: Gastro-esophageal reflux disease without esophagitis: K21.9

## 2016-11-10 HISTORY — DX: Pneumonia, unspecified organism: J18.9

## 2016-11-10 HISTORY — DX: Sleep apnea, unspecified: G47.30

## 2016-11-10 HISTORY — DX: Type 2 diabetes mellitus with diabetic neuropathy, unspecified: E11.40

## 2016-11-10 HISTORY — DX: Acute embolism and thrombosis of unspecified deep veins of unspecified lower extremity: I82.409

## 2016-11-10 LAB — SURGICAL PCR SCREEN
MRSA, PCR: POSITIVE — AB
Staphylococcus aureus: POSITIVE — AB

## 2016-11-10 LAB — BASIC METABOLIC PANEL
Anion gap: 11 (ref 5–15)
BUN: 18 mg/dL (ref 6–20)
CO2: 25 mmol/L (ref 22–32)
Calcium: 8.9 mg/dL (ref 8.9–10.3)
Chloride: 103 mmol/L (ref 101–111)
Creatinine, Ser: 1.21 mg/dL (ref 0.61–1.24)
GFR calc Af Amer: >60 mL/min (ref >60)
GFR calc non Af Amer: >60 mL/min (ref >60)
Glucose, Bld: 207 mg/dL — ABNORMAL HIGH (ref 65–99)
Potassium: 3.7 mmol/L (ref 3.5–5.1)
Sodium: 139 mmol/L (ref 135–145)

## 2016-11-10 LAB — CBC
HCT: 42 % (ref 39.0–52.0)
Hemoglobin: 12.7 g/dL — ABNORMAL LOW (ref 13.0–17.0)
MCH: 25.7 pg — ABNORMAL LOW (ref 26.0–34.0)
MCHC: 30.2 g/dL (ref 30.0–36.0)
MCV: 84.8 fL (ref 78.0–100.0)
Platelets: 261 10*3/uL (ref 150–400)
RBC: 4.95 MIL/uL (ref 4.22–5.81)
RDW: 15.7 % — ABNORMAL HIGH (ref 11.5–15.5)
WBC: 5.9 10*3/uL (ref 4.0–10.5)

## 2016-11-10 LAB — GLUCOSE, CAPILLARY: Glucose-Capillary: 182 mg/dL — ABNORMAL HIGH (ref 65–99)

## 2016-11-10 MED FILL — MUPIROCIN 2% OINTMENT: 2 | 5 days supply | Qty: 22 | Fill #0

## 2016-11-10 NOTE — Progress Notes (Signed)
Pt denies SOB, chest pain, and being under the care of a cardiologist. Pt denies having a cardiac cath. Pt stated that last stress test was performed 2001. Pt stated that he was instructed to stop both Aspirin and Xarelto 2 days prior to surgery. Sherrie, Surgical Coordinator, stated that MD advised that pt stop Aspirin 1 week prior to procedure. Sherrie stated that she will contact pt with specific pre-op instructions on when to stop both Aspirin and Xarelto. Spoke with Sherrie regarding orders ( pt was an 8:00 AM PAT; office was closed during PAT visit).

## 2016-11-10 NOTE — Progress Notes (Signed)
Pt chart forwarded to anesthesia for review. 

## 2016-11-10 NOTE — Progress Notes (Signed)
Needs vanc preop also

## 2016-11-11 ENCOUNTER — Telehealth: Payer: Self-pay | Admitting: *Deleted

## 2016-11-11 NOTE — Progress Notes (Addendum)
Anesthesia Chart Review:  Pt is a 66 year old male scheduled for R total knee arthroplasty on 11/19/2016 with Frankey Shown, MD  - PCP is Sherrie Mustache, NP  PMH includes:  HTN, DM, CKD, PE, DVT, prostate cancer, OSA, GERD. Never smoker. BMI 51  Medications include: amlodipine, ASA 81, lipitor, carvedilol, empagliflozin, glipizide, hydralazine, losartan, metformin, prilosec, potassium, xarelto. Last dose xarelto 11/16/16 and pt to hold ASA 1 week before surgery per Sherrie in Dr. Phoebe Sharps office.   Preoperative labs reviewed.   - Glucose 207. HbA1c was 8.0 on 10/27/16.  - Labs, including PT, ordered by Dr. [name] were not signed at PAT.  Will be obtained DOS.   CXR 06/13/16:  - Stable changes of pulmonary venous hypertension. - No evidence of focal airspace consolidation or overt pulmonary edema.  CT angio chest 06/13/16:  - No acute pulmonary embolus evident. Mild chronic pulmonary embolus change noted in the superior segment right lower lobe pulmonary artery, stable. - Areas of atherosclerotic calcification. No thoracic aortic aneurysm or dissection. - No adenopathy. There is patchy lung atelectasis bilaterally without edema or consolidation. Mild pleural thickening bilaterally is stable and benign in appearance. - Cholelithiasis. - Left adrenal adenoma.  EKG 06/13/16: Sinus rhythm. Multiple premature complexes, vent & supraven , new since last tracing. Probable LVH. Borderline prolonged QT interval  Echo 11/24/15:  - Left ventricle: The cavity size was normal. There was moderate concentric hypertrophy. Systolic function was normal. The estimated ejection fraction was in the range of 55% to 60%. Wall motion was normal; there were no regional wall motion abnormalities. Features are consistent with a pseudonormal left ventricular filling pattern, with concomitant abnormal relaxation and increased filling pressure (grade 2 diastolic dysfunction). - Aortic valve: There was trivial regurgitation. - Left  atrium: The atrium was moderately dilated. - Right atrium: The atrium was mildly to moderately dilated.  If labs acceptable DOS, I anticipate pt can proceed with surgery as scheduled.   Willeen Cass, FNP-BC [facility]/Anesthesiology Phone: [phone] 11/16/2016 2:23 PM

## 2016-11-11 NOTE — Telephone Encounter (Signed)
This is fine, but we need to know which type of mask to write the prescription for the mask and tubing.  I can sign this Thursday when I'm back in the office.

## 2016-11-11 NOTE — Telephone Encounter (Signed)
Patient called requesting a Rx for a new CPAP mask. His is broken.  Stated he needs a new one and [facility] will not give him one without a Rx. He has to take his CPAP machine with him to the hospital when he has his knee replacement surgery on 11/19/16.  Please Advise.

## 2016-11-11 NOTE — Telephone Encounter (Signed)
Patient stated that it is called a Full Face Mask and the tubing is all the same per patient. Stated to call tomorrow once ready for pick up.

## 2016-11-12 ENCOUNTER — Other Ambulatory Visit (INDEPENDENT_AMBULATORY_CARE_PROVIDER_SITE_OTHER): Payer: Self-pay | Admitting: Orthopaedic Surgery

## 2016-11-12 MED ORDER — AMBULATORY NON FORMULARY MEDICATION: 0 refills | Status: DC

## 2016-11-12 NOTE — Telephone Encounter (Signed)
Rx Printed and placed for Dr. Mariea Clonts to review and sign.

## 2016-11-13 MED FILL — JARDIANCE 10 MG TABLET: 10 | 30 days supply | Qty: 30 | Fill #5

## 2016-11-18 MED ORDER — VANCOMYCIN HCL 10 G IV SOLR
1500.0000 mg | INTRAVENOUS | Status: AC
  Administered 2016-11-19 (×2): 1500 mg via INTRAVENOUS
  Filled 2016-11-18: qty 1500

## 2016-11-19 ENCOUNTER — Encounter (HOSPITAL_COMMUNITY): Payer: Self-pay | Admitting: *Deleted

## 2016-11-19 ENCOUNTER — Inpatient Hospital Stay (HOSPITAL_COMMUNITY): Payer: Medicare Other

## 2016-11-19 ENCOUNTER — Inpatient Hospital Stay (HOSPITAL_COMMUNITY)
Admission: RE | Admit: 2016-11-19 | Discharge: 2016-11-21 | DRG: 470 | Disposition: A | Discharge status: Home Health | Payer: Medicare Other | Source: Ambulatory Visit | Attending: Orthopaedic Surgery | Admitting: Orthopaedic Surgery

## 2016-11-19 ENCOUNTER — Ambulatory Visit (HOSPITAL_COMMUNITY): Payer: Medicare Other | Admitting: Emergency Medicine

## 2016-11-19 ENCOUNTER — Encounter (HOSPITAL_COMMUNITY)
Admission: RE | Disposition: A | Discharge status: Home Health | Payer: Self-pay | Source: Ambulatory Visit | Attending: Orthopaedic Surgery

## 2016-11-19 ENCOUNTER — Ambulatory Visit (HOSPITAL_COMMUNITY): Payer: Medicare Other | Admitting: Certified Registered"

## 2016-11-19 DIAGNOSIS — Z973 Presence of spectacles and contact lenses: Secondary | ICD-10-CM | POA: Diagnosis not present

## 2016-11-19 DIAGNOSIS — I129 Hypertensive chronic kidney disease with stage 1 through stage 4 chronic kidney disease, or unspecified chronic kidney disease: Secondary | ICD-10-CM | POA: Diagnosis present

## 2016-11-19 DIAGNOSIS — M1711 Unilateral primary osteoarthritis, right knee: Secondary | ICD-10-CM | POA: Diagnosis not present

## 2016-11-19 DIAGNOSIS — G8918 Other acute postprocedural pain: Secondary | ICD-10-CM | POA: Diagnosis not present

## 2016-11-19 DIAGNOSIS — E1122 Type 2 diabetes mellitus with diabetic chronic kidney disease: Secondary | ICD-10-CM | POA: Diagnosis present

## 2016-11-19 DIAGNOSIS — Z79899 Other long term (current) drug therapy: Secondary | ICD-10-CM | POA: Diagnosis not present

## 2016-11-19 DIAGNOSIS — Z923 Personal history of irradiation: Secondary | ICD-10-CM | POA: Diagnosis not present

## 2016-11-19 DIAGNOSIS — M25761 Osteophyte, right knee: Secondary | ICD-10-CM | POA: Diagnosis present

## 2016-11-19 DIAGNOSIS — Z6841 Body Mass Index (BMI) 40.0 and over, adult: Secondary | ICD-10-CM

## 2016-11-19 DIAGNOSIS — Z96659 Presence of unspecified artificial knee joint: Secondary | ICD-10-CM

## 2016-11-19 DIAGNOSIS — N189 Chronic kidney disease, unspecified: Secondary | ICD-10-CM | POA: Diagnosis present

## 2016-11-19 DIAGNOSIS — G4730 Sleep apnea, unspecified: Secondary | ICD-10-CM | POA: Diagnosis present

## 2016-11-19 DIAGNOSIS — Z8546 Personal history of malignant neoplasm of prostate: Secondary | ICD-10-CM

## 2016-11-19 DIAGNOSIS — C61 Malignant neoplasm of prostate: Secondary | ICD-10-CM | POA: Diagnosis not present

## 2016-11-19 DIAGNOSIS — Z86711 Personal history of pulmonary embolism: Secondary | ICD-10-CM | POA: Diagnosis not present

## 2016-11-19 DIAGNOSIS — Z7901 Long term (current) use of anticoagulants: Secondary | ICD-10-CM

## 2016-11-19 DIAGNOSIS — E1140 Type 2 diabetes mellitus with diabetic neuropathy, unspecified: Secondary | ICD-10-CM | POA: Diagnosis present

## 2016-11-19 DIAGNOSIS — I2699 Other pulmonary embolism without acute cor pulmonale: Secondary | ICD-10-CM | POA: Diagnosis not present

## 2016-11-19 DIAGNOSIS — Z7982 Long term (current) use of aspirin: Secondary | ICD-10-CM

## 2016-11-19 DIAGNOSIS — Z7984 Long term (current) use of oral hypoglycemic drugs: Secondary | ICD-10-CM | POA: Diagnosis not present

## 2016-11-19 DIAGNOSIS — Z888 Allergy status to other drugs, medicaments and biological substances status: Secondary | ICD-10-CM | POA: Diagnosis not present

## 2016-11-19 DIAGNOSIS — Z79891 Long term (current) use of opiate analgesic: Secondary | ICD-10-CM

## 2016-11-19 DIAGNOSIS — D62 Acute posthemorrhagic anemia: Secondary | ICD-10-CM | POA: Diagnosis not present

## 2016-11-19 DIAGNOSIS — Z96651 Presence of right artificial knee joint: Secondary | ICD-10-CM | POA: Diagnosis not present

## 2016-11-19 DIAGNOSIS — Z471 Aftercare following joint replacement surgery: Secondary | ICD-10-CM | POA: Diagnosis not present

## 2016-11-19 DIAGNOSIS — Z86718 Personal history of other venous thrombosis and embolism: Secondary | ICD-10-CM

## 2016-11-19 DIAGNOSIS — K219 Gastro-esophageal reflux disease without esophagitis: Secondary | ICD-10-CM | POA: Diagnosis present

## 2016-11-19 DIAGNOSIS — Z8042 Family history of malignant neoplasm of prostate: Secondary | ICD-10-CM

## 2016-11-19 HISTORY — PX: TOTAL KNEE ARTHROPLASTY: SHX125

## 2016-11-19 LAB — CBC WITH DIFFERENTIAL/PLATELET
Basophils Absolute: 0 10*3/uL (ref 0.0–0.1)
Basophils Relative: 0 %
Eosinophils Absolute: 0 10*3/uL (ref 0.0–0.7)
Eosinophils Relative: 1 %
HCT: 40.2 % (ref 39.0–52.0)
Hemoglobin: 12.4 g/dL — ABNORMAL LOW (ref 13.0–17.0)
Lymphocytes Relative: 20 %
Lymphs Abs: 1.2 10*3/uL (ref 0.7–4.0)
MCH: 26 pg (ref 26.0–34.0)
MCHC: 30.8 g/dL (ref 30.0–36.0)
MCV: 84.3 fL (ref 78.0–100.0)
Monocytes Absolute: 0.3 10*3/uL (ref 0.1–1.0)
Monocytes Relative: 5 %
Neutro Abs: 4.6 10*3/uL (ref 1.7–7.7)
Neutrophils Relative %: 74 %
Platelets: 270 10*3/uL (ref 150–400)
RBC: 4.77 MIL/uL (ref 4.22–5.81)
RDW: 15.3 % (ref 11.5–15.5)
WBC: 6.2 10*3/uL (ref 4.0–10.5)

## 2016-11-19 LAB — PROTIME-INR
INR: 1.05
Prothrombin Time: 13.6 seconds (ref 11.4–15.2)

## 2016-11-19 LAB — HEPATIC FUNCTION PANEL
ALT: 21 U/L (ref 17–63)
AST: 15 U/L (ref 15–41)
Albumin: 3.7 g/dL (ref 3.5–5.0)
Alkaline Phosphatase: 63 U/L (ref 38–126)
Bilirubin, Direct: 0.2 mg/dL (ref 0.1–0.5)
Indirect Bilirubin: 0.8 mg/dL (ref 0.3–0.9)
Total Bilirubin: 1 mg/dL (ref 0.3–1.2)
Total Protein: 7.2 g/dL (ref 6.5–8.1)

## 2016-11-19 LAB — GLUCOSE, CAPILLARY
Glucose-Capillary: 200 mg/dL — ABNORMAL HIGH (ref 65–99)
Glucose-Capillary: 202 mg/dL — ABNORMAL HIGH (ref 65–99)
Glucose-Capillary: 197 mg/dL — ABNORMAL HIGH (ref 65–99)

## 2016-11-19 LAB — APTT: aPTT: 32 seconds (ref 24–36)

## 2016-11-19 LAB — SEDIMENTATION RATE: Sed Rate: 20 mm/hr — ABNORMAL HIGH (ref 0–16)

## 2016-11-19 LAB — TYPE AND SCREEN
ABO/RH(D): O POS
Antibody Screen: NEGATIVE

## 2016-11-19 LAB — C-REACTIVE PROTEIN: CRP: 2.6 mg/dL — ABNORMAL HIGH (ref <1.0)

## 2016-11-19 LAB — ABO/RH: ABO/RH(D): O POS

## 2016-11-19 SURGERY — ARTHROPLASTY, KNEE, TOTAL
Anesthesia: General | Site: Knee | Laterality: Right

## 2016-11-19 MED ORDER — SENNOSIDES-DOCUSATE SODIUM 8.6-50 MG PO TABS: 1.0000 | ORAL_TABLET | Freq: Every evening | ORAL | 1 refills | Status: DC | PRN

## 2016-11-19 MED ORDER — MIDAZOLAM HCL 2 MG/2ML IJ SOLN
INTRAMUSCULAR | Status: AC
Start: 2016-11-19 — End: 2016-11-19
  Administered 2016-11-19: 2 mg via INTRAVENOUS
  Filled 2016-11-19: qty 2

## 2016-11-19 MED ORDER — MAGNESIUM CITRATE PO SOLN: 1.0000 | Freq: Once | ORAL | Status: DC | PRN

## 2016-11-19 MED ORDER — METHOCARBAMOL 1000 MG/10ML IJ SOLN
500.0000 mg | Freq: Four times a day (QID) | INTRAVENOUS | Status: DC | PRN
  Filled 2016-11-19: qty 5

## 2016-11-19 MED ORDER — MORPHINE SULFATE (PF) 4 MG/ML IV SOLN: 1.0000 mg | INTRAVENOUS | Status: DC | PRN

## 2016-11-19 MED ORDER — KETOROLAC TROMETHAMINE 15 MG/ML IJ SOLN
30.0000 mg | Freq: Four times a day (QID) | INTRAMUSCULAR | Status: AC
  Administered 2016-11-19 – 2016-11-20 (×4): 30 mg via INTRAVENOUS
  Filled 2016-11-19 (×4): qty 2

## 2016-11-19 MED ORDER — GLIPIZIDE 5 MG PO TABS
5.0000 mg | ORAL_TABLET | Freq: Every day | ORAL | Status: DC
  Administered 2016-11-20: 5 mg via ORAL
  Filled 2016-11-19 (×2): qty 1

## 2016-11-19 MED ORDER — BUPIVACAINE LIPOSOME 1.3 % IJ SUSP
20.0000 mL | INTRAMUSCULAR | Status: DC
  Filled 2016-11-19: qty 20

## 2016-11-19 MED ORDER — OXYCODONE HCL 5 MG PO TABS: 5.0000 mg | ORAL_TABLET | ORAL | 0 refills | Status: DC | PRN

## 2016-11-19 MED ORDER — CHLORHEXIDINE GLUCONATE 4 % EX LIQD: 60.0000 mL | Freq: Once | CUTANEOUS | Status: DC

## 2016-11-19 MED ORDER — PHENOL 1.4 % MT LIQD: 1.0000 | OROMUCOSAL | Status: DC | PRN

## 2016-11-19 MED ORDER — HYDRALAZINE HCL 20 MG/ML IJ SOLN
10.0000 mg | Freq: Once | INTRAMUSCULAR | Status: AC
  Administered 2016-11-19: 10 mg via INTRAVENOUS

## 2016-11-19 MED ORDER — HYDROMORPHONE HCL 1 MG/ML IJ SOLN
INTRAMUSCULAR | Status: AC
  Administered 2016-11-19: 0.5 mg via INTRAVENOUS
  Filled 2016-11-19: qty 1

## 2016-11-19 MED ORDER — ACETAMINOPHEN 325 MG PO TABS: 650.0000 mg | ORAL_TABLET | Freq: Four times a day (QID) | ORAL | Status: DC | PRN

## 2016-11-19 MED ORDER — KETOROLAC TROMETHAMINE 30 MG/ML IJ SOLN
30.0000 mg | Freq: Once | INTRAMUSCULAR | Status: DC | PRN
  Administered 2016-11-19: 30 mg via INTRAVENOUS

## 2016-11-19 MED ORDER — CEFAZOLIN SODIUM-DEXTROSE 2-4 GM/100ML-% IV SOLN
2.0000 g | Freq: Four times a day (QID) | INTRAVENOUS | Status: AC
  Administered 2016-11-19 – 2016-11-20 (×2): 2 g via INTRAVENOUS
  Filled 2016-11-19 (×2): qty 100

## 2016-11-19 MED ORDER — ONDANSETRON HCL 4 MG/2ML IJ SOLN
INTRAMUSCULAR | Status: AC
  Filled 2016-11-19: qty 2

## 2016-11-19 MED ORDER — TRANEXAMIC ACID 1000 MG/10ML IV SOLN: INTRAVENOUS | Status: DC | PRN

## 2016-11-19 MED ORDER — SODIUM CHLORIDE 0.9 % IV SOLN
INTRAVENOUS | Status: DC
  Administered 2016-11-20: 05:00:00 via INTRAVENOUS

## 2016-11-19 MED ORDER — ONDANSETRON HCL 4 MG/2ML IJ SOLN: 4.0000 mg | Freq: Four times a day (QID) | INTRAMUSCULAR | Status: DC | PRN

## 2016-11-19 MED ORDER — SORBITOL 70 % SOLN: 30.0000 mL | Freq: Every day | Status: DC | PRN

## 2016-11-19 MED ORDER — ROCURONIUM BROMIDE 10 MG/ML (PF) SYRINGE
PREFILLED_SYRINGE | INTRAVENOUS | Status: DC | PRN
  Administered 2016-11-19: 60 mg via INTRAVENOUS

## 2016-11-19 MED ORDER — KETOROLAC TROMETHAMINE 30 MG/ML IJ SOLN
INTRAMUSCULAR | Status: AC
  Filled 2016-11-19: qty 1

## 2016-11-19 MED ORDER — LABETALOL HCL 5 MG/ML IV SOLN
INTRAVENOUS | Status: AC
  Administered 2016-11-19: 5 mg via INTRAVENOUS
  Filled 2016-11-19: qty 4

## 2016-11-19 MED ORDER — OXYCODONE HCL ER 10 MG PO T12A: 10.0000 mg | EXTENDED_RELEASE_TABLET | Freq: Two times a day (BID) | ORAL | 0 refills | Status: DC

## 2016-11-19 MED ORDER — TRANEXAMIC ACID 1000 MG/10ML IV SOLN
2000.0000 mg | INTRAVENOUS | Status: AC
  Administered 2016-11-19: 2000 mg via TOPICAL
  Filled 2016-11-19: qty 20

## 2016-11-19 MED ORDER — HYDRALAZINE HCL 20 MG/ML IJ SOLN
INTRAMUSCULAR | Status: AC
  Filled 2016-11-19: qty 1

## 2016-11-19 MED ORDER — RIVAROXABAN 20 MG PO TABS
20.0000 mg | ORAL_TABLET | Freq: Every day | ORAL | Status: DC
  Administered 2016-11-20: 20 mg via ORAL
  Filled 2016-11-19: qty 1

## 2016-11-19 MED ORDER — ONDANSETRON HCL 4 MG/2ML IJ SOLN
INTRAMUSCULAR | Status: DC | PRN
  Administered 2016-11-19: 4 mg via INTRAVENOUS

## 2016-11-19 MED ORDER — ACETAMINOPHEN 10 MG/ML IV SOLN
INTRAVENOUS | Status: DC | PRN
  Administered 2016-11-19: 1000 mg via INTRAVENOUS

## 2016-11-19 MED ORDER — ONDANSETRON HCL 4 MG PO TABS: 4.0000 mg | ORAL_TABLET | Freq: Four times a day (QID) | ORAL | Status: DC | PRN

## 2016-11-19 MED ORDER — SUGAMMADEX SODIUM 200 MG/2ML IV SOLN
INTRAVENOUS | Status: DC | PRN
  Administered 2016-11-19: 300 mg via INTRAVENOUS

## 2016-11-19 MED ORDER — CANAGLIFLOZIN 100 MG PO TABS
100.0000 mg | ORAL_TABLET | Freq: Every day | ORAL | Status: DC
  Administered 2016-11-20: 100 mg via ORAL
  Filled 2016-11-19 (×2): qty 1

## 2016-11-19 MED ORDER — INSULIN ASPART 100 UNIT/ML ~~LOC~~ SOLN
0.0000 [IU] | Freq: Every day | SUBCUTANEOUS | Status: DC
  Administered 2016-11-20: 3 [IU] via SUBCUTANEOUS

## 2016-11-19 MED ORDER — OXYCODONE HCL 5 MG PO TABS
10.0000 mg | ORAL_TABLET | ORAL | Status: DC | PRN
  Administered 2016-11-20 – 2016-11-21 (×3): 10 mg via ORAL
  Filled 2016-11-19 (×3): qty 2

## 2016-11-19 MED ORDER — HYDROMORPHONE HCL 1 MG/ML IJ SOLN
0.2500 mg | INTRAMUSCULAR | Status: DC | PRN
  Administered 2016-11-19 (×4): 0.5 mg via INTRAVENOUS

## 2016-11-19 MED ORDER — OXYCODONE HCL ER 15 MG PO T12A
15.0000 mg | EXTENDED_RELEASE_TABLET | Freq: Two times a day (BID) | ORAL | Status: DC
  Administered 2016-11-19 – 2016-11-21 (×4): 15 mg via ORAL
  Filled 2016-11-19 (×4): qty 1

## 2016-11-19 MED ORDER — METOCLOPRAMIDE HCL 5 MG PO TABS: 5.0000 mg | ORAL_TABLET | Freq: Three times a day (TID) | ORAL | Status: DC | PRN

## 2016-11-19 MED ORDER — PROMETHAZINE HCL 25 MG PO TABS: 25.0000 mg | ORAL_TABLET | Freq: Four times a day (QID) | ORAL | 1 refills | Status: DC | PRN

## 2016-11-19 MED ORDER — LOSARTAN POTASSIUM 50 MG PO TABS
100.0000 mg | ORAL_TABLET | Freq: Every day | ORAL | Status: DC
  Administered 2016-11-20: 100 mg via ORAL
  Filled 2016-11-19 (×3): qty 2

## 2016-11-19 MED ORDER — AMLODIPINE BESYLATE 10 MG PO TABS
10.0000 mg | ORAL_TABLET | Freq: Every day | ORAL | Status: DC
  Administered 2016-11-20 – 2016-11-21 (×2): 10 mg via ORAL
  Filled 2016-11-19 (×2): qty 1

## 2016-11-19 MED ORDER — CARVEDILOL 6.25 MG PO TABS
6.2500 mg | ORAL_TABLET | Freq: Two times a day (BID) | ORAL | Status: DC
  Administered 2016-11-20 – 2016-11-21 (×2): 6.25 mg via ORAL
  Filled 2016-11-19 (×2): qty 1

## 2016-11-19 MED ORDER — ACETAMINOPHEN 500 MG PO TABS
1000.0000 mg | ORAL_TABLET | Freq: Four times a day (QID) | ORAL | Status: AC
  Administered 2016-11-19 – 2016-11-20 (×4): 1000 mg via ORAL
  Filled 2016-11-19 (×4): qty 2

## 2016-11-19 MED ORDER — DEXAMETHASONE SODIUM PHOSPHATE 10 MG/ML IJ SOLN
10.0000 mg | Freq: Once | INTRAMUSCULAR | Status: AC
  Administered 2016-11-20: 10 mg via INTRAVENOUS
  Filled 2016-11-19: qty 1

## 2016-11-19 MED ORDER — FENTANYL CITRATE (PF) 100 MCG/2ML IJ SOLN
100.0000 ug | Freq: Once | INTRAMUSCULAR | Status: AC
  Administered 2016-11-19: 100 ug via INTRAVENOUS

## 2016-11-19 MED ORDER — POLYETHYLENE GLYCOL 3350 17 G PO PACK: 17.0000 g | PACK | Freq: Every day | ORAL | Status: DC | PRN

## 2016-11-19 MED ORDER — ROCURONIUM BROMIDE 10 MG/ML (PF) SYRINGE
PREFILLED_SYRINGE | INTRAVENOUS | Status: AC
  Filled 2016-11-19: qty 5

## 2016-11-19 MED ORDER — PROPOFOL 10 MG/ML IV BOLUS
INTRAVENOUS | Status: DC | PRN
  Administered 2016-11-19: 200 mg via INTRAVENOUS
  Administered 2016-11-19: 150 mg via INTRAVENOUS

## 2016-11-19 MED ORDER — ACETAMINOPHEN 10 MG/ML IV SOLN
INTRAVENOUS | Status: AC
  Filled 2016-11-19: qty 100

## 2016-11-19 MED ORDER — DIPHENHYDRAMINE HCL 12.5 MG/5ML PO ELIX: 25.0000 mg | ORAL_SOLUTION | ORAL | Status: DC | PRN

## 2016-11-19 MED ORDER — POTASSIUM CHLORIDE CRYS ER 20 MEQ PO TBCR
20.0000 meq | EXTENDED_RELEASE_TABLET | Freq: Two times a day (BID) | ORAL | Status: DC
  Administered 2016-11-19 – 2016-11-21 (×4): 20 meq via ORAL
  Filled 2016-11-19 (×4): qty 1

## 2016-11-19 MED ORDER — SUGAMMADEX SODIUM 500 MG/5ML IV SOLN
INTRAVENOUS | Status: AC
  Filled 2016-11-19: qty 5

## 2016-11-19 MED ORDER — MIDAZOLAM HCL 2 MG/2ML IJ SOLN
2.0000 mg | Freq: Once | INTRAMUSCULAR | Status: AC
  Administered 2016-11-19: 2 mg via INTRAVENOUS

## 2016-11-19 MED ORDER — PROMETHAZINE HCL 25 MG/ML IJ SOLN: 6.2500 mg | INTRAMUSCULAR | Status: DC | PRN

## 2016-11-19 MED ORDER — METHOCARBAMOL 750 MG PO TABS: 750.0000 mg | ORAL_TABLET | Freq: Two times a day (BID) | ORAL | 0 refills | Status: DC | PRN

## 2016-11-19 MED ORDER — LIDOCAINE 2% (20 MG/ML) 5 ML SYRINGE
INTRAMUSCULAR | Status: AC
  Filled 2016-11-19: qty 5

## 2016-11-19 MED ORDER — ONDANSETRON HCL 4 MG PO TABS: 4.0000 mg | ORAL_TABLET | Freq: Three times a day (TID) | ORAL | 0 refills | Status: DC | PRN

## 2016-11-19 MED ORDER — MIDAZOLAM HCL 2 MG/2ML IJ SOLN
INTRAMUSCULAR | Status: AC
  Filled 2016-11-19: qty 2

## 2016-11-19 MED ORDER — HYDRALAZINE HCL 25 MG PO TABS
25.0000 mg | ORAL_TABLET | Freq: Three times a day (TID) | ORAL | Status: DC
  Administered 2016-11-19 – 2016-11-21 (×5): 25 mg via ORAL
  Filled 2016-11-19 (×5): qty 1

## 2016-11-19 MED ORDER — ACETAMINOPHEN 650 MG RE SUPP: 650.0000 mg | Freq: Four times a day (QID) | RECTAL | Status: DC | PRN

## 2016-11-19 MED ORDER — FENTANYL CITRATE (PF) 250 MCG/5ML IJ SOLN
INTRAMUSCULAR | Status: AC
  Filled 2016-11-19: qty 5

## 2016-11-19 MED ORDER — MEPERIDINE HCL 25 MG/ML IJ SOLN: 6.2500 mg | INTRAMUSCULAR | Status: DC | PRN

## 2016-11-19 MED ORDER — LIDOCAINE 2% (20 MG/ML) 5 ML SYRINGE
INTRAMUSCULAR | Status: DC | PRN
  Administered 2016-11-19: 100 mg via INTRAVENOUS

## 2016-11-19 MED ORDER — INSULIN ASPART 100 UNIT/ML ~~LOC~~ SOLN
0.0000 [IU] | Freq: Three times a day (TID) | SUBCUTANEOUS | Status: DC
  Administered 2016-11-20: 7 [IU] via SUBCUTANEOUS
  Administered 2016-11-20: 11 [IU] via SUBCUTANEOUS
  Administered 2016-11-21: 7 [IU] via SUBCUTANEOUS

## 2016-11-19 MED ORDER — FENTANYL CITRATE (PF) 100 MCG/2ML IJ SOLN
INTRAMUSCULAR | Status: AC
  Administered 2016-11-19: 100 ug via INTRAVENOUS
  Filled 2016-11-19: qty 2

## 2016-11-19 MED ORDER — OXYCODONE HCL 5 MG PO TABS: 5.0000 mg | ORAL_TABLET | ORAL | Status: DC | PRN

## 2016-11-19 MED ORDER — ROPIVACAINE HCL 5 MG/ML IJ SOLN
INTRAMUSCULAR | Status: DC | PRN
  Administered 2016-11-19 (×6): 5 mL via PERINEURAL

## 2016-11-19 MED ORDER — MIDAZOLAM HCL 5 MG/5ML IJ SOLN
INTRAMUSCULAR | Status: DC | PRN
  Administered 2016-11-19: 2 mg via INTRAVENOUS

## 2016-11-19 MED ORDER — PROPOFOL 10 MG/ML IV BOLUS
INTRAVENOUS | Status: AC
  Filled 2016-11-19: qty 20

## 2016-11-19 MED ORDER — FENTANYL CITRATE (PF) 250 MCG/5ML IJ SOLN
INTRAMUSCULAR | Status: DC | PRN
  Administered 2016-11-19: 50 ug via INTRAVENOUS
  Administered 2016-11-19: 25 ug via INTRAVENOUS
  Administered 2016-11-19 (×2): 50 ug via INTRAVENOUS
  Administered 2016-11-19: 25 ug via INTRAVENOUS

## 2016-11-19 MED ORDER — METHOCARBAMOL 500 MG PO TABS
500.0000 mg | ORAL_TABLET | Freq: Four times a day (QID) | ORAL | Status: DC | PRN
  Administered 2016-11-20: 500 mg via ORAL
  Filled 2016-11-19: qty 1

## 2016-11-19 MED ORDER — LABETALOL HCL 5 MG/ML IV SOLN
5.0000 mg | Freq: Once | INTRAVENOUS | Status: AC
  Administered 2016-11-19: 5 mg via INTRAVENOUS

## 2016-11-19 MED ORDER — ALUM & MAG HYDROXIDE-SIMETH 200-200-20 MG/5ML PO SUSP: 30.0000 mL | ORAL | Status: DC | PRN

## 2016-11-19 MED ORDER — SODIUM CHLORIDE 0.9 % IR SOLN
Status: DC | PRN
  Administered 2016-11-19: 1000 mL
  Administered 2016-11-19: 3000 mL
  Administered 2016-11-19: 1000 mL

## 2016-11-19 MED ORDER — ATORVASTATIN CALCIUM 10 MG PO TABS
10.0000 mg | ORAL_TABLET | Freq: Every day | ORAL | Status: DC
  Administered 2016-11-19 – 2016-11-20 (×2): 10 mg via ORAL
  Filled 2016-11-19 (×2): qty 1

## 2016-11-19 MED ORDER — TRANEXAMIC ACID 1000 MG/10ML IV SOLN
1000.0000 mg | Freq: Once | INTRAVENOUS | Status: AC
  Administered 2016-11-19: 1000 mg via INTRAVENOUS
  Filled 2016-11-19: qty 10

## 2016-11-19 MED ORDER — LACTATED RINGERS IV SOLN
INTRAVENOUS | Status: DC
  Administered 2016-11-19 (×2): via INTRAVENOUS

## 2016-11-19 MED ORDER — MENTHOL 3 MG MT LOZG: 1.0000 | LOZENGE | OROMUCOSAL | Status: DC | PRN

## 2016-11-19 MED ORDER — METOCLOPRAMIDE HCL 5 MG/ML IJ SOLN: 5.0000 mg | Freq: Three times a day (TID) | INTRAMUSCULAR | Status: DC | PRN

## 2016-11-19 SURGICAL SUPPLY — 68 items
ALCOHOL ISOPROPYL (RUBBING) (MISCELLANEOUS) ×2 IMPLANT
APL SKNCLS STERI-STRIP NONHPOA (GAUZE/BANDAGES/DRESSINGS) ×1
BAG DECANTER FOR FLEXI CONT (MISCELLANEOUS) ×2 IMPLANT
BANDAGE ACE 6X5 VEL STRL LF (GAUZE/BANDAGES/DRESSINGS) ×4 IMPLANT
BANDAGE ELASTIC 6 VELCRO ST LF (GAUZE/BANDAGES/DRESSINGS) ×1 IMPLANT
BANDAGE ESMARK 6X9 LF (GAUZE/BANDAGES/DRESSINGS) ×1 IMPLANT
BENZOIN TINCTURE PRP APPL 2/3 (GAUZE/BANDAGES/DRESSINGS) ×2 IMPLANT
BLADE SAW SGTL 13.0X1.19X90.0M (BLADE) ×2 IMPLANT
BNDG CMPR 9X6 STRL LF SNTH (GAUZE/BANDAGES/DRESSINGS) ×1
BNDG ESMARK 6X9 LF (GAUZE/BANDAGES/DRESSINGS) ×2
BOWL SMART MIX CTS (DISPOSABLE) ×2 IMPLANT
CAPT KNEE TOTAL 3 ×1 IMPLANT
CEMENT BONE REFOBACIN R1X40 US (Cement) ×2 IMPLANT
CLSR STERI-STRIP ANTIMIC 1/2X4 (GAUZE/BANDAGES/DRESSINGS) ×4 IMPLANT
COVER BACK TABLE 60X90IN (DRAPES) ×1 IMPLANT
COVER SURGICAL LIGHT HANDLE (MISCELLANEOUS) ×2 IMPLANT
CUFF TOURNIQUET SINGLE 34IN LL (TOURNIQUET CUFF) ×2 IMPLANT
CUFF TOURNIQUET SINGLE 44IN (TOURNIQUET CUFF) IMPLANT
DRAPE EXTREMITY T 121X128X90 (DRAPE) ×2 IMPLANT
DRAPE HALF SHEET 40X57 (DRAPES) ×2 IMPLANT
DRAPE INCISE IOBAN 66X45 STRL (DRAPES) ×1 IMPLANT
DRAPE ORTHO SPLIT 77X108 STRL (DRAPES) ×4
DRAPE SURG 17X11 SM STRL (DRAPES) ×4 IMPLANT
DRAPE SURG ORHT 6 SPLT 77X108 (DRAPES) ×2 IMPLANT
DRSG AQUACEL AG ADV 3.5X14 (GAUZE/BANDAGES/DRESSINGS) ×2 IMPLANT
DURAPREP 26ML APPLICATOR (WOUND CARE) ×4 IMPLANT
ELECT CAUTERY BLADE 6.4 (BLADE) ×2 IMPLANT
ELECT REM PT RETURN 9FT ADLT (ELECTROSURGICAL) ×2
ELECTRODE REM PT RTRN 9FT ADLT (ELECTROSURGICAL) ×1 IMPLANT
GLOVE SKINSENSE NS SZ7.5 (GLOVE) ×1
GLOVE SKINSENSE STRL SZ7.5 (GLOVE) ×1 IMPLANT
GLOVE SURG SYN 7.5  E (GLOVE) ×4
GLOVE SURG SYN 7.5 E (GLOVE) ×4 IMPLANT
GLOVE SURG SYN 7.5 PF PI (GLOVE) ×4 IMPLANT
GOWN STRL REIN XL XLG (GOWN DISPOSABLE) ×2 IMPLANT
GOWN STRL REUS W/ TWL LRG LVL3 (GOWN DISPOSABLE) ×1 IMPLANT
GOWN STRL REUS W/TWL LRG LVL3 (GOWN DISPOSABLE) ×2
HANDPIECE INTERPULSE COAX TIP (DISPOSABLE) ×2
HOOD PEEL AWAY FACE SHEILD DIS (HOOD) ×5 IMPLANT
HOOD PEEL AWAY FLYTE STAYCOOL (MISCELLANEOUS) ×4 IMPLANT
KIT BASIN OR (CUSTOM PROCEDURE TRAY) ×2 IMPLANT
KIT ROOM TURNOVER OR (KITS) ×2 IMPLANT
MANIFOLD NEPTUNE II (INSTRUMENTS) ×2 IMPLANT
MARKER SKIN DUAL TIP RULER LAB (MISCELLANEOUS) ×2 IMPLANT
NDL SPNL 18GX3.5 QUINCKE PK (NEEDLE) ×1 IMPLANT
NEEDLE SPNL 18GX3.5 QUINCKE PK (NEEDLE) ×2 IMPLANT
NS IRRIG 1000ML POUR BTL (IV SOLUTION) ×2 IMPLANT
PACK TOTAL JOINT (CUSTOM PROCEDURE TRAY) ×2 IMPLANT
PAD ARMBOARD 7.5X6 YLW CONV (MISCELLANEOUS) ×4 IMPLANT
SAW OSC TIP CART 19.5X105X1.3 (SAW) ×2 IMPLANT
SET HNDPC FAN SPRY TIP SCT (DISPOSABLE) ×1 IMPLANT
STAPLER VISISTAT 35W (STAPLE) IMPLANT
STRIP CLOSURE SKIN 1/2X4 (GAUZE/BANDAGES/DRESSINGS) ×1 IMPLANT
SUCTION FRAZIER HANDLE 10FR (MISCELLANEOUS) ×1
SUCTION TUBE FRAZIER 10FR DISP (MISCELLANEOUS) ×1 IMPLANT
SUT ETHILON 2 0 FS 18 (SUTURE) IMPLANT
SUT MNCRL AB 4-0 PS2 18 (SUTURE) IMPLANT
SUT VIC AB 0 CT1 27 (SUTURE) ×4
SUT VIC AB 0 CT1 27XBRD ANBCTR (SUTURE) ×2 IMPLANT
SUT VIC AB 1 CTX 27 (SUTURE) ×6 IMPLANT
SUT VIC AB 2-0 CT1 27 (SUTURE) ×6
SUT VIC AB 2-0 CT1 TAPERPNT 27 (SUTURE) ×3 IMPLANT
SYR 50ML LL SCALE MARK (SYRINGE) ×2 IMPLANT
TOWEL OR 17X24 6PK STRL BLUE (TOWEL DISPOSABLE) ×2 IMPLANT
TOWEL OR 17X26 10 PK STRL BLUE (TOWEL DISPOSABLE) ×2 IMPLANT
TRAY CATH 16FR W/PLASTIC CATH (SET/KITS/TRAYS/PACK) IMPLANT
UNDERPAD 30X30 (UNDERPADS AND DIAPERS) ×2 IMPLANT
WRAP KNEE MAXI GEL POST OP (GAUZE/BANDAGES/DRESSINGS) ×2 IMPLANT

## 2016-11-19 NOTE — Anesthesia Preprocedure Evaluation (Addendum)
Anesthesia Evaluation  Patient identified by MRN, date of birth, ID band Patient awake    Reviewed: Allergy & Precautions, NPO status , Patient's Chart, lab work & pertinent test results  Airway Mallampati: II  TM Distance: >3 FB Neck ROM: Full    Dental  (+) Dental Advisory Given, Teeth Intact   Pulmonary sleep apnea and Continuous Positive Airway Pressure Ventilation ,    Pulmonary exam normal breath sounds clear to auscultation       Cardiovascular hypertension, + DVT  Normal cardiovascular exam Rhythm:Regular Rate:Normal     Neuro/Psych  Headaches, negative psych ROS   GI/Hepatic Neg liver ROS, GERD  Controlled,  Endo/Other  diabetes, Type 2, Oral Hypoglycemic AgentsMorbid obesity  Renal/GU CRFRenal disease Bladder dysfunction  Self catheritization    Musculoskeletal  (+) Arthritis ,   Abdominal   Peds  Hematology negative hematology ROS (+)   Anesthesia Other Findings Prostate CA  Reproductive/Obstetrics                           Anesthesia Physical Anesthesia Plan  ASA: III  Anesthesia Plan: General   Post-op Pain Management:  Regional for Post-op pain   Induction: Intravenous  PONV Risk Score and Plan: 3 and Ondansetron, Dexamethasone, Midazolam and Treatment may vary due to age or medical condition  Airway Management Planned: Oral ETT  Additional Equipment: None  Intra-op Plan:   Post-operative Plan: Extubation in OR  Informed Consent: I have reviewed the patients History and Physical, chart, labs and discussed the procedure including the risks, benefits and alternatives for the proposed anesthesia with the patient or authorized representative who has indicated his/her understanding and acceptance.   Dental advisory given  Plan Discussed with: CRNA  Anesthesia Plan Comments: (Lengthy discussion with patient regarding spinal anesthetic. Patient states he has had bad  experiences in past with "spinal taps", encountering difficult with medical team accessing intrathecal space, causing him much discomfort. Patient adamantly refuses spinal and requests general anesthesia.)      Anesthesia Quick Evaluation

## 2016-11-19 NOTE — H&P (Signed)
PREOPERATIVE H&P  Chief Complaint: osteoarthritis right knee  HPI: Thomas [name] is a 66 y.o. male who presents for surgical treatment of osteoarthritis right knee.  He denies any changes in medical history.  Past Medical History:  Diagnosis Date  . Arthritis   . Cancer Thomas [name]) 2010   Prostate  . Chronic kidney disease   . Diabetes mellitus   . Diabetic neuropathy (HCC)    feet  . DVT (deep venous thrombosis) (Thomas [name])   . Genetic testing 06/22/2016   Thomas [name] underwent genetic counseling and testing for hereditary cancer syndromes on 05/14/2016. His results were negative for mutations in all 46 genes analyzed by [facility]'s 46-gene Common Hereditary Cancers Panel. Genes analyzed include: APC, ATM, AXIN2, BARD1, BMPR1A, BRCA1, BRCA2, BRIP1, CDH1, CDKN2A, CHEK2, CTNNA1, DICER1, EPCAM, GREM1, HOXB13, KIT, MEN1, MLH1, MSH2, MSH3, MSH6, MUTYH, NB  . GERD (gastroesophageal reflux disease)   . Headache   . Hypertension   . PE (pulmonary thromboembolism) (Thomas [name])   . Pneumonia   . Sleep apnea    not wearing CPAP  . Wears glasses    Past Surgical History:  Procedure Laterality Date  . COLONOSCOPY    . HERNIA REPAIR    . KNEE SURGERY    . MULTIPLE TOOTH EXTRACTIONS    . PROSTATE SURGERY    . RADIOACTIVE SEED IMPLANT    . SHOULDER SURGERY    . uretha surgery-2014     Social History   Social History  . Marital status: Married    Spouse name: N/A  . Number of children: N/A  . Years of education: N/A   Social History Main Topics  . Smoking status: Never Smoker  . Smokeless tobacco: Never Used  . Alcohol use No     Comment: never  . Drug use: No  . Sexual activity: Yes   Other Topics Concern  . None   Social History Narrative  . None   Family History  Problem Relation Age of Onset  . Breast cancer Mother 55       d.89  . Breast cancer Sister 20       d.30  . Leukemia Brother 18       d.20  . Breast cancer Maternal Aunt 40       d.40s  . Lung cancer  Maternal Uncle   . Prostate cancer Paternal Uncle   . Prostate cancer Brother        recurred recently at age 74  . Other Brother 15       spinal tumor  . Cervical cancer Other 22       d.22  . Cancer Sister 38       unspecified type  . Cancer Maternal Uncle 80       unspecified type   Allergies  Allergen Reactions  . Lisinopril Cough   Prior to Admission medications   Medication Sig Start Date End Date Taking? Authorizing Provider  amLODipine (NORVASC) 10 MG tablet Take 1 tablet (10 mg total) by mouth daily. For high blood pressure 05/21/16  Yes Thomas Chandler, NP  aspirin EC 81 MG tablet Take 81 mg by mouth daily.   Yes [provider]  atorvastatin (LIPITOR) 10 MG tablet Take one tablet by mouth once daily for cholesterol 09/30/15  Yes Thomas Chandler, NP  carvedilol (COREG) 6.25 MG tablet Take 1 tablet (6.25 mg total) by mouth 2 (two) times daily with a meal. For high blood pressure 01/03/16  Yes Thomas Chandler, NP  diclofenac sodium (VOLTAREN) 1 % GEL Apply 2 g topically 4 (four) times daily. Patient taking differently: Apply 2 g topically 4 (four) times daily as needed.  08/21/16  Yes Thomas [name] Kitchen, Thomas [name]  empagliflozin (JARDIANCE) 10 MG TABS tablet Take 10 mg by mouth daily. 05/05/16  Yes Thomas Chandler, NP  glipiZIDE (GLUCOTROL) 5 MG tablet Take 1 tablet (5 mg total) by mouth daily before breakfast. 01/03/16  Yes Thomas Chandler, NP  hydrALAZINE (APRESOLINE) 25 MG tablet Take 1 tablet (25 mg total) by mouth 3 (three) times daily. 02/20/16  Yes Thomas Chandler, NP  losartan (COZAAR) 100 MG tablet Take 1 tablet (100 mg total) by mouth daily. 09/22/16  Yes Thomas Chandler, NP  metFORMIN (GLUCOPHAGE) 500 MG tablet TAKE 1 TABLET BY MOUTH 2 TIMES A DAY WITH A MEAL for diabetes 01/21/16  Yes Thomas Dooms, MD  omeprazole (PRILOSEC) 20 MG capsule Take 1 capsule (20 mg total) by mouth as needed. 05/24/14  Yes Thomas Chandler, NP  oxyCODONE (OXY IR/ROXICODONE)  5 MG immediate release tablet Take 2 tablets (10 mg total) by mouth every 4 (four) hours as needed for moderate pain. 10/10/16  Yes Thomas [name], Nishant, MD  potassium chloride SA (K-DUR,KLOR-CON) 20 MEQ tablet Take 20 mEq by mouth 2 (two) times daily.   Yes [provider]  rivaroxaban (XARELTO) 20 MG TABS tablet Take 1 tablet (20 mg total) by mouth daily with supper. 01/23/16  Yes Thomas [name], Thomas Cobb, MD  traMADol (ULTRAM) 50 MG tablet Take 1 tablet (50 mg total) by mouth every 6 (six) hours as needed. for pain 10/10/16  Yes Thomas [name], Nishant, MD  AMBULATORY NON FORMULARY MEDICATION Full Face CPAP Mask with Tubing Dx: G47.30 11/12/16   Thomas [name], Thomas [name], Thomas [name]  TRUE METRIX BLOOD GLUCOSE TEST test strip CHECK BLOOD SUGAR TWICE PER DAY 10/24/15   Thomas Chandler, NP  TRUEPLUS LANCETS 30G MISC USE TO TEST BLOOD SUGAR 2 TIMES A DAY 09/12/14   Thomas Chandler, NP     Positive ROS: All other systems have been reviewed and were otherwise negative with the exception of those mentioned in the HPI and as above.  Physical Exam: General: Alert, no acute distress Cardiovascular: No pedal edema Respiratory: No cyanosis, no use of accessory musculature GI: abdomen soft Skin: No lesions in the area of chief complaint Neurologic: Sensation intact distally Psychiatric: Patient is competent for consent with normal mood and affect Lymphatic: no lymphedema  MUSCULOSKELETAL: exam stable  Assessment: osteoarthritis right knee  Plan: Plan for Procedure(s): RIGHT TOTAL KNEE ARTHROPLASTY  The risks benefits and alternatives were discussed with the patient including but not limited to the risks of nonoperative treatment, versus surgical intervention including infection, bleeding, nerve injury,  blood clots, cardiopulmonary complications, morbidity, mortality, among others, and they were willing to proceed.   Eduard Roux, MD   11/19/2016 1:43 PM

## 2016-11-19 NOTE — Progress Notes (Signed)
Orthopedic Tech Progress Note Patient Details:  Thomas [name] [birth year] [id number]  CPM Right Knee CPM Right Knee: On Right Knee Flexion (Degrees): 90 Right Knee Extension (Degrees): 0 Additional Comments: applied cpm to pt right knee/leg.    Right knee   [name] 11/19/2016, 7:00 PM

## 2016-11-19 NOTE — Op Note (Signed)
Total Knee Arthroplasty Procedure Note  Preoperative diagnosis: Right knee osteoarthritis  Postoperative diagnosis:same  Operative procedure: Right total knee arthroplasty. CPT [phone]  Surgeon: N. Eduard Roux, MD  Assistants: Ky Barban, RNFA  Anesthesia: general, regional  Tourniquet time: 81 mins  Implants used: [employer] Femur: PS 7 Tibia: 7 Patella: 35 mm, 7.5 thick Polyethylene: 9 mm  Indication: Thomas [name] is a 66 y.o. year old male with a history of knee pain. Having failed conservative management, the patient elected to proceed with a total knee arthroplasty.  We have reviewed the risk and benefits of the surgery and they elected to proceed after voicing understanding.  Procedure:  After informed consent was obtained and understanding of the risk were voiced including but not limited to bleeding, infection, damage to surrounding structures including nerves and vessels, blood clots, leg length inequality and the failure to achieve desired results, the operative extremity was marked with verbal confirmation of the patient in the holding area.   The patient was then brought to the operating room and transported to the operating room table in the supine position.  A tourniquet was applied to the operative extremity around the upper thigh. The operative limb was then prepped and draped in the usual sterile fashion and preoperative antibiotics were administered.  A time out was performed prior to the start of surgery confirming the correct extremity, preoperative antibiotic administration, as well as team members, implants and instruments available for the case. Correct surgical site was also confirmed with preoperative radiographs. The limb was then elevated for exsanguination and the tourniquet was inflated. A midline incision was made and a standard medial parapatellar approach was performed.  The patella was prepared and sized to a 35 mm.  A cover was  placed on the patella for protection from retractors.  We then turned our attention to the femur. Posterior cruciate ligament was sacrificed. Start site was drilled in the femur and the intramedullary distal femoral cutting guide was placed, set at 5 degrees valgus, taking 13 mm of distal resection. The distal cut was made. Osteophytes were then removed. Next, the proximal tibial cutting guide was placed with appropriate slope, varus/valgus alignment and depth of resection. The proximal tibial cut was made. Gap blocks were then used to assess the extension gap and alignment, and appropriate soft tissue releases were performed. Attention was turned back to the femur, which was sized using the sizing guide to a size 7. Appropriate rotation of the femoral component was determined using epicondylar axis, Whiteside's line, and assessing the flexion gap under ligament tension. The appropriate size 4-in-1 cutting block was placed and cuts were made. Posterior femoral osteophytes and uncapped bone were then removed with the curved osteotome. The tibia was sized for a size 7 component. The femoral box-cutting guide was placed and prepared for a PS femoral component. Trial components were placed, and stability was checked in full extension, mid-flexion, and deep flexion. Proper tibial rotation was determined and marked.  The patella tracked well without a lateral release. Trial components were then removed and tibial preparation performed. A posterior capsular injection comprising of 20 cc of 1.3% exparel and 40 cc of normal saline was performed for postoperative pain control. The bony surfaces were irrigated with a pulse lavage and then dried. Bone cement was vacuum mixed on the back table, and the final components sized above were cemented into place. After cement had finished curing, excess cement was removed. The stability of the construct  was re-evaluated throughout a range of motion and found to be acceptable. The  trial liner was removed, the knee was copiously irrigated, and the knee was re-evaluated for any excess bone debris. The real polyethylene liner, 9 mm thick, was inserted and checked to ensure the locking mechanism had engaged appropriately. The tourniquet was deflated and hemostasis was achieved. The wound was irrigated with dilute betadine in normal saline, and then again with normal saline. A drain was not placed. Capsular closure was performed with a #1 vicryl, subcutaneous fat closed with a 0 vicryl suture, then subcutaneous tissue closed with interrupted 2.0 vicryl suture. The skin was then closed with a 3.0 monocryl. A sterile dressing was applied.   The patient was awakened in the operating room and taken to recovery in stable condition. All sponge, needle, and instrument counts were correct at the end of the case.  Position: supine  Complications: none.  Time Out: performed   Drains/Packing: none  Estimated blood loss: 50 cc  Returned to Recovery Room: in good condition.   Antibiotics: yes   Mechanical VTE (DVT) Prophylaxis: sequential compression devices, TED thigh-high  Chemical VTE (DVT) Prophylaxis: xarelto  Fluid Replacement  Crystalloid: see anesthesia record Blood: none  FFP: none   Specimens Removed: 1 to pathology   Sponge and Instrument Count Correct? yes   PACU: portable radiograph - knee AP and Lateral   Admission: inpatient status  Plan/RTC: Return in 2 weeks for wound check.   Weight Bearing/Load Lower Extremity: full   N. Eduard Roux, MD [place] 4:09 PM

## 2016-11-19 NOTE — Progress Notes (Signed)
Orthopedic Tech Progress Note Patient Details:  Thomas [name] 07-Jul-1950 [id number] NO OVER HEAD FRAME APPLIED PT WEIGHT IS OVER THE LIMIT.     [name] 11/19/2016, 7:01 PM

## 2016-11-19 NOTE — Anesthesia Procedure Notes (Addendum)
Anesthesia Regional Block: Adductor canal block   Pre-Anesthetic Checklist: ,, timeout performed, Correct Patient, Correct Site, Correct Laterality, Correct Procedure, Correct Position, site marked, Risks and benefits discussed,  Surgical consent,  Pre-op evaluation,  At surgeon's request and post-op pain management  Laterality: Right and Lower  Prep: chloraprep       Needles:  Injection technique: Single-shot  Needle Type: Echogenic Stimulator Needle     Needle Length: 10cm  Needle Gauge: 21   Needle insertion depth: 4.7 cm   Additional Needles:   Procedures:,,,, ultrasound used (permanent image in chart),,,,  Narrative:  Start time: 11/19/2016 1:47 PM End time: 11/19/2016 1:55 PM Injection made incrementally with aspirations every 5 mL.  Performed by: Personally  Anesthesiologist: Lyn Hollingshead

## 2016-11-19 NOTE — Anesthesia Procedure Notes (Signed)
Procedure Name: Intubation Date/Time: 11/19/2016 2:22 PM Performed by: Freddie Breech Pre-anesthesia Checklist: Patient identified, Emergency Drugs available, Suction available and Patient being monitored Patient Re-evaluated:Patient Re-evaluated prior to induction Oxygen Delivery Method: Circle System Utilized Preoxygenation: Pre-oxygenation with 100% oxygen Induction Type: IV induction Ventilation: Mask ventilation without difficulty and Oral airway inserted - appropriate to patient size Grade View: Grade III Tube type: Oral Tube size: 8.0 mm Number of attempts: 1 Airway Equipment and Method: Stylet and Oral airway Placement Confirmation: ETT inserted through vocal cords under direct vision,  positive ETCO2 and breath sounds checked- equal and bilateral Secured at: 23 cm Tube secured with: Tape Dental Injury: Teeth and Oropharynx as per pre-operative assessment

## 2016-11-19 NOTE — Transfer of Care (Signed)
Immediate Anesthesia Transfer of Care Note  Patient: Thomas [name]  Procedure(s) Performed: RIGHT TOTAL KNEE ARTHROPLASTY (Right Knee)  Patient Location: PACU  Anesthesia Type:GA combined with regional for post-op pain  Level of Consciousness: awake, alert  and oriented  Airway & Oxygen Therapy: Patient Spontanous Breathing and Patient connected to nasal cannula oxygen  Post-op Assessment: Report given to RN and Post -op Vital signs reviewed and stable  Post vital signs: Reviewed and stable  Last Vitals:  Vitals:   11/19/16 1355 11/19/16 1359  BP: 126/69 (!) 150/75  Pulse: 60 (!) 57  Resp: 16 15  Temp:    SpO2: 98% 98%    Last Pain:  Vitals:   11/19/16 1400  TempSrc:   PainSc: 0-No pain      Patients Stated Pain Goal: 4 (26/94/85 4627)  Complications: No apparent anesthesia complications

## 2016-11-19 NOTE — Anesthesia Postprocedure Evaluation (Signed)
Anesthesia Post Note  Patient: Thomas [name]  Procedure(s) Performed: RIGHT TOTAL KNEE ARTHROPLASTY (Right Knee)     Patient location during evaluation: PACU Anesthesia Type: General Level of consciousness: awake and alert Pain management: pain level controlled Vital Signs Assessment: post-procedure vital signs reviewed and stable Respiratory status: spontaneous breathing, nonlabored ventilation, respiratory function stable and patient connected to nasal cannula oxygen Cardiovascular status: blood pressure returned to baseline and stable Postop Assessment: no apparent nausea or vomiting Anesthetic complications: no    Last Vitals:  Vitals:   11/19/16 1359 11/19/16 1700  BP: (!) 150/75 (!) 176/86  Pulse: (!) 57 75  Resp: 15 16  Temp:  (!) 36.2 C  SpO2: 98% 95%    Last Pain:  Vitals:   11/19/16 1700  TempSrc:   PainSc: 0-No pain                 Alroy Portela S

## 2016-11-20 ENCOUNTER — Encounter (HOSPITAL_COMMUNITY): Payer: Self-pay | Admitting: Orthopaedic Surgery

## 2016-11-20 LAB — CBC
HCT: 38.4 % — ABNORMAL LOW (ref 39.0–52.0)
Hemoglobin: 11.8 g/dL — ABNORMAL LOW (ref 13.0–17.0)
MCH: 26.1 pg (ref 26.0–34.0)
MCHC: 30.7 g/dL (ref 30.0–36.0)
MCV: 85 fL (ref 78.0–100.0)
Platelets: 244 10*3/uL (ref 150–400)
RBC: 4.52 MIL/uL (ref 4.22–5.81)
RDW: 15.3 % (ref 11.5–15.5)
WBC: 8.5 10*3/uL (ref 4.0–10.5)

## 2016-11-20 LAB — BASIC METABOLIC PANEL
Anion gap: 6 (ref 5–15)
BUN: 14 mg/dL (ref 6–20)
CO2: 30 mmol/L (ref 22–32)
Calcium: 7.8 mg/dL — ABNORMAL LOW (ref 8.9–10.3)
Chloride: 98 mmol/L — ABNORMAL LOW (ref 101–111)
Creatinine, Ser: 1.35 mg/dL — ABNORMAL HIGH (ref 0.61–1.24)
GFR calc Af Amer: >60 mL/min (ref >60)
GFR calc non Af Amer: 53 mL/min — ABNORMAL LOW (ref >60)
Glucose, Bld: 202 mg/dL — ABNORMAL HIGH (ref 65–99)
Potassium: 4.1 mmol/L (ref 3.5–5.1)
Sodium: 134 mmol/L — ABNORMAL LOW (ref 135–145)

## 2016-11-20 LAB — GLUCOSE, CAPILLARY
Glucose-Capillary: 282 mg/dL — ABNORMAL HIGH (ref 65–99)
Glucose-Capillary: 238 mg/dL — ABNORMAL HIGH (ref 65–99)
Glucose-Capillary: 293 mg/dL — ABNORMAL HIGH (ref 65–99)

## 2016-11-20 NOTE — Evaluation (Signed)
Occupational Therapy Evaluation and Discharge Patient Details Name: Thomas [name] MRN: [id number] DOB: 1950/09/27 Today's Date: 11/20/2016    History of Present Illness Pt is a 66 y.o. male now s/p R TKA on 11/19/16. Pertinent PMH includes arthritis, CKD, DM, HTN, GERD, DVT, prostate CA.   Clinical Impression   This 66 yo male admitted and underwent above presents to acute OT with all OT education completed, we will D/C from acute OT.    Follow Up Recommendations  No OT follow up;Supervision/Assistance - 24 hour    Equipment Recommendations  None recommended by OT       Precautions / Restrictions Precautions Precautions: Knee;Fall Precaution Booklet Issued: No Precaution Comments: Verbally reviewed precautions Restrictions Weight Bearing Restrictions: No RLE Weight Bearing: Weight bearing as tolerated      Mobility Bed Mobility     General bed mobility comments: pt up in recliner upon my arrival  Transfers Overall transfer level: Needs assistance Equipment used: Rolling walker (2 wheeled) Transfers: Sit to/from Stand Sit to Stand: Min guard         General transfer comment: increased time as well    Balance Overall balance assessment: Needs assistance Sitting-balance support: No upper extremity supported;Feet supported Sitting balance-Leahy Scale: Good     Standing balance support: Bilateral upper extremity supported;During functional activity Standing balance-Leahy Scale: Poor Standing balance comment: Reliant on BUE support                           ADL either performed or assessed with clinical judgement   ADL Overall ADL's : Needs assistance/impaired Eating/Feeding: Independent;Sitting   Grooming: Set up;Sitting   Upper Body Bathing: Set up;Sitting   Lower Body Bathing: Min guard;Sit to/from stand   Upper Body Dressing : Set up;Sitting   Lower Body Dressing: Min guard;Sit to/from stand   Toilet Transfer: Min  guard;Ambulation;Comfort height toilet;Grab bars;RW   Toileting- Water quality scientist and Hygiene: Min guard;Sit to/from stand         General ADL Comments: Pt reports his walk in shower is big enough to walk into frontwards with RW and turn around to sit on shower seat--I educated that it will be easier if he steps in with the good and steps out with the operated leg. I educated him on the eaisiet and most efficient sequence of getting dressed. He plans on using the "ledges" around his toliet to help him get up and down--I recommended that someone be with him the first couple of times incase they need to stablized the walker if he finds holding onto it with one hand is easier than both hands on the "ledges"     Vision Patient Visual Report: No change from baseline              Pertinent Vitals/Pain Pain Assessment: 0-10 Pain Score: 4  Faces Pain Scale: Hurts little more Pain Location: R knee Pain Descriptors / Indicators: Aching;Sore Pain Intervention(s): Limited activity within patient's tolerance;Monitored during session;Repositioned     Hand Dominance Right   Extremity/Trunk Assessment Upper Extremity Assessment Upper Extremity Assessment: Overall WFL for tasks assessed   Lower Extremity Assessment Lower Extremity Assessment: RLE deficits/detail;Generalized weakness RLE Deficits / Details: s/p R TKA; knee ext grossly 2/5 RLE Sensation: decreased light touch       Communication Communication Communication: No difficulties   Cognition Arousal/Alertness: Awake/alert Behavior During Therapy: WFL for tasks assessed/performed Overall Cognitive Status: Within Functional Limits for tasks assessed  General Comments  Daughters present during session            [place] expects to be discharged to:: Private residence Living Arrangements: Children Available Help at Discharge: Family;Available 24  hours/day Type of Home: House Home Access: Stairs to enter [employer] of Steps: 2 Entrance Stairs-Rails: Left Home Layout: One level     Bathroom Shower/Tub: Walk-in shower;Door   [employer] Toilet: Standard Bathroom Accessibility: Yes   Home Equipment: Environmental consultant - 2 wheels;Crutches;Shower seat;Bedside commode   Additional Comments: Has been living with daughter since previous hospital admission, will plan to d/c here with 24/7 assist available      Prior Functioning/Environment Level of Independence: Independent with assistive device(s)        Comments: Mod indep with RW        OT Problem List: Decreased range of motion;Decreased strength;Impaired balance (sitting and/or standing);Obesity;Pain      OT Treatment/Interventions:      OT Goals(Current goals can be found in the care plan section) Acute Rehab OT Goals Patient Stated Goal: Return home with less pain  OT Frequency:                AM-PAC PT "6 Clicks" Daily Activity     Outcome Measure Help from another person eating meals?: None Help from another person taking care of personal grooming?: A Little Help from another person toileting, which includes using toliet, bedpan, or urinal?: A Little Help from another person bathing (including washing, rinsing, drying)?: A Little Help from another person to put on and taking off regular upper body clothing?: A Little Help from another person to put on and taking off regular lower body clothing?: A Little 6 Click Score: 19   End of Session Equipment Utilized During Treatment: Gait belt;Rolling walker CPM Right Knee CPM Right Knee: Off  Activity Tolerance: Patient tolerated treatment well Patient left: in chair;with call bell/phone within reach  OT Visit Diagnosis: Unsteadiness on feet (R26.81);Other abnormalities of gait and mobility (R26.89);Pain Pain - Right/Left: Right Pain - part of body: Knee                Time: 5974-1638 OT Time Calculation  (min): 22 min Charges:  OT General Charges $OT Visit: 1 Visit OT Evaluation $OT Eval Moderate Complexity: [address], [place] [phone] 11/20/2016

## 2016-11-20 NOTE — Progress Notes (Signed)
   Subjective:  Patient reports pain as mild.    Objective:   VITALS:   Vitals:   11/19/16 1915 11/19/16 1922 11/20/16 0113 11/20/16 0452  BP:  (!) 177/88 (!) 156/85 139/74  Pulse: 88 81 74 67  Resp: 14 16 16 17   Temp:  98.2 F (36.8 C) 98.6 F (37 C) 98.6 F (37 C)  TempSrc:   Oral Oral  SpO2: 94% 95% 96% 91%  Weight:        Neurologically intact Neurovascular intact Sensation intact distally Intact pulses distally Dorsiflexion/Plantar flexion intact Incision: dressing C/D/I and no drainage No cellulitis present Compartment soft   Lab Results  Component Value Date   WBC 8.5 11/20/2016   HGB 11.8 (L) 11/20/2016   HCT 38.4 (L) 11/20/2016   MCV 85.0 11/20/2016   PLT 244 11/20/2016     Assessment/Plan:  1 Day Post-Op   - Expected postop acute blood loss anemia - will monitor for symptoms - Up with PT/OT - DVT ppx - SCDs, ambulation, xarelto - WBAT operative extremity - SSI for DM - Pain control - Discharge planning - likely home with HHPT  Thomas [name] 11/20/2016, 7:33 AM [phone]

## 2016-11-20 NOTE — Progress Notes (Signed)
Physical Therapy Treatment Patient Details Name: Thomas [name] MRN: [id number] DOB: 1950-09-26 Today's Date: 11/20/2016    History of Present Illness Pt is a 66 y.o. male now s/p R TKA on 11/19/16. Pertinent PMH includes arthritis, CKD, DM, HTN, GERD, DVT, prostate CA.    PT Comments    Pt performed increased activity and increased gait distance.  Pt's largest limiting factor in terminal knee extension.  Plan next session for stair training and therapeutic exercises. Pt remains appropriate for HHPT at d/c.     Follow Up Recommendations  DC plan and follow up therapy as arranged by surgeon;Supervision for mobility/OOB     Equipment Recommendations  None recommended by PT (owns DME)    Recommendations for Other Services OT consult     Precautions / Restrictions Precautions Precautions: Knee;Fall Precaution Booklet Issued: No Precaution Comments: Verbally reviewed precautions Restrictions Weight Bearing Restrictions: No RLE Weight Bearing: Weight bearing as tolerated    Mobility  Bed Mobility Overal bed mobility: Needs Assistance Bed Mobility: Supine to Sit;Sit to Supine     Supine to sit: HOB elevated;Min assist     General bed mobility comments: Pt required cues for sequencing and assist to advance RLE to edge of bed.  Pt required assistance due to pain.  When returning to supine position patient required supervision for lifting LEs.    Transfers Overall transfer level: Needs assistance Equipment used: Rolling walker (2 wheeled) Transfers: Sit to/from Stand Sit to Stand: Min guard         General transfer comment: Pt required increased time with cues for hand placement and forward weight shifting to achieve standing edge of bed.    Ambulation/Gait Ambulation/Gait assistance: Min guard Ambulation Distance (Feet): 250 Feet Assistive device: Rolling walker (2 wheeled) (bariatric) Gait Pattern/deviations: Step-to pattern;Decreased weight shift to  right;Antalgic;Trunk flexed;Step-through pattern Gait velocity: Decreased Gait velocity interpretation: Below normal speed for age/gender General Gait Details: Pt remains slow and guarded with decreased extension on RLE in stance phase.  Pt required x2 standing breaks during trials and presents with increased work of breathing during last 100 ft.  Pt required cues for pushing through RW, R heel strike and R knee extension in stance phase.     Stairs            Wheelchair Mobility    Modified Rankin (Stroke Patients Only)       Balance Overall balance assessment: Needs assistance Sitting-balance support: No upper extremity supported;Feet supported Sitting balance-Leahy Scale: Good     Standing balance support: Bilateral upper extremity supported;During functional activity Standing balance-Leahy Scale: Poor Standing balance comment: Reliant on BUE support                            Cognition Arousal/Alertness: Awake/alert Behavior During Therapy: WFL for tasks assessed/performed Overall Cognitive Status: Within Functional Limits for tasks assessed                                        Exercises      General Comments        Pertinent Vitals/Pain Pain Assessment: 0-10 Pain Score: 4  Pain Location: R knee Pain Descriptors / Indicators: Aching;Sore Pain Intervention(s): Monitored during session;Repositioned;Ice applied    Home Living Family/patient expects to be discharged to:: Private residence Living Arrangements: Children Available Help at Discharge: Family;Available 24 hours/day  Type of Home: House Home Access: Stairs to enter Entrance Stairs-Rails: Left Home Layout: One level Home Equipment: Walker - 2 wheels;Crutches;Shower seat;Bedside commode Additional Comments: Has been living with daughter since previous hospital admission, will plan to d/c here with 24/7 assist available    Prior Function Level of Independence:  Independent with assistive device(s)      Comments: Mod indep with RW   PT Goals (current goals can now be found in the care plan section) Acute Rehab PT Goals Patient Stated Goal: Return home with less pain Potential to Achieve Goals: Good Progress towards PT goals: Progressing toward goals    Frequency    7X/week      PT Plan Current plan remains appropriate    Co-evaluation              AM-PAC PT "6 Clicks" Daily Activity  Outcome Measure  Difficulty turning over in bed (including adjusting bedclothes, sheets and blankets)?: A Little Difficulty moving from lying on back to sitting on the side of the bed? : A Little Difficulty sitting down on and standing up from a chair with arms (e.g., wheelchair, bedside commode, etc,.)?: A Little Help needed moving to and from a bed to chair (including a wheelchair)?: A Little Help needed walking in hospital room?: A Little Help needed climbing 3-5 steps with a railing? : A Lot 6 Click Score: 17    End of Session         PT Visit Diagnosis: Other abnormalities of gait and mobility (R26.89);Pain Pain - Right/Left: Right Pain - part of body: Knee     Time: 3159-4585 PT Time Calculation (min) (ACUTE ONLY): 30 min  Charges:  $Gait Training: 8-22 mins $Therapeutic Activity: 8-22 mins                    G Codes:       Thomas [name], PTA pager [phone]    Thomas [name] 11/20/2016, 3:18 PM

## 2016-11-20 NOTE — Evaluation (Addendum)
Physical Therapy Evaluation Patient Details Name: Thomas [name] MRN: [id number] DOB: [birth year] Today's Date: 11/20/2016   History of Present Illness  Pt is a 66 y.o. male now s/p R TKA on 11/19/16. Pertinent PMH includes arthritis, CKD, DM, HTN, GERD, DVT, prostate CA.    Clinical Impression  Pt presents with pain and an overall decrease in functional mobility secondary to above. PTA, pt mod indep with RW and lives with family who will be available for 24/7 assist if needed. Educ on precautions, positioning, therex, and importance of mobility. Today, pt able to transfer and amb with RW and min guard for balance; pt potentially self-limiting secondary to anxiety and pain with mobility. Required significant increased time for transitions secondary to this, in addition to max encouragement. Pt would benefit from continued acute PT services to maximize functional mobility and independence prior to d/c.     Follow Up Recommendations DC plan and follow up therapy as arranged by surgeon;Supervision for mobility/OOB    Equipment Recommendations  None recommended by PT (owns DME)    Recommendations for Other Services OT consult     Precautions / Restrictions Precautions Precautions: Knee;Fall Precaution Booklet Issued: No Precaution Comments: Verbally reviewed precautions Restrictions Weight Bearing Restrictions: Yes RLE Weight Bearing: Weight bearing as tolerated      Mobility  Bed Mobility Overal bed mobility: Needs Assistance Bed Mobility: Supine to Sit     Supine to sit: Min guard;HOB elevated     General bed mobility comments: Increased time and effort for bed mobility; no physical assist required  Transfers Overall transfer level: Needs assistance Equipment used: Rolling walker (2 wheeled) Transfers: Sit to/from Stand Sit to Stand: Min guard         General transfer comment: Stood x2 from bed and chair. Pt sitting EOB >3 min in preparation for standing, stating  "my mind just has to get ready". Cues for hand placement on RW. When finally able to stand, required no physical assist, just increased time and effort secondary to pain and anxiety.  Ambulation/Gait Ambulation/Gait assistance: Min guard Ambulation Distance (Feet): 40 Feet Assistive device: Rolling walker (2 wheeled) Gait Pattern/deviations: Step-to pattern;Step-through pattern;Decreased weight shift to right;Antalgic;Trunk flexed Gait velocity: Decreased Gait velocity interpretation: <1.8 ft/sec, indicative of risk for recurrent falls General Gait Details: Very slow, antalgic gait with RW and min guard for balance; initial report of dizziness with standing which subsided with rest. Pt heavily reliant on BUE support to offload R knee, but enoucraged to WBAT and heel-to-toe gait pattern to maximize full knee ext. Cues for upright posture and to straighten R knee  Stairs            Wheelchair Mobility    Modified Rankin (Stroke Patients Only)       Balance Overall balance assessment: Needs assistance Sitting-balance support: No upper extremity supported;Feet supported Sitting balance-Leahy Scale: Fair     Standing balance support: Bilateral upper extremity supported;During functional activity Standing balance-Leahy Scale: Poor Standing balance comment: Reliant on BUE support                             Pertinent Vitals/Pain Pain Assessment: Faces Faces Pain Scale: Hurts little more Pain Location: R knee Pain Descriptors / Indicators: Aching;Discomfort;Operative site guarding Pain Intervention(s): Monitored during session;Ice applied    Home Living Family/patient expects to be discharged to:: Private residence Living Arrangements: Children Available Help at Discharge: Family;Available 24 hours/day Type of Home: House  Home Access: Stairs to enter Entrance Stairs-Rails: Left Entrance Stairs-Number of Steps: 2 Home Layout: One level Home Equipment: Walker - 2  wheels;Crutches;Shower seat Additional Comments: Has been living with daughter since previous hospital admission, will plan to d/c here with 24/7 assist available    Prior Function Level of Independence: Independent with assistive device(s)         Comments: Mod indep with RW     Hand Dominance        Extremity/Trunk Assessment   Upper Extremity Assessment Upper Extremity Assessment: Overall WFL for tasks assessed    Lower Extremity Assessment Lower Extremity Assessment: RLE deficits/detail;Generalized weakness RLE Deficits / Details: s/p R TKA; knee ext grossly 2/5 RLE Sensation: decreased light touch       Communication   Communication: No difficulties  Cognition Arousal/Alertness: Awake/alert Behavior During Therapy: WFL for tasks assessed/performed Overall Cognitive Status: Within Functional Limits for tasks assessed                                        General Comments General comments (skin integrity, edema, etc.): Daughter present during session    Exercises Total Joint Exercises Ankle Circles/Pumps: AROM;Both;20 reps;Seated Quad Sets: AROM;Right;10 reps;Seated Towel Squeeze: AROM;Both;10 reps;Seated   Assessment/Plan    PT Assessment Patient needs continued PT services  PT Problem List Decreased strength;Decreased range of motion;Decreased activity tolerance;Decreased balance;Decreased mobility;Decreased knowledge of use of DME;Decreased knowledge of precautions;Pain       PT Treatment Interventions DME instruction;Gait training;Stair training;Functional mobility training;Therapeutic activities;Therapeutic exercise;Balance training;Patient/family education    PT Goals (Current goals can be found in the Care Plan section)  Acute Rehab PT Goals Patient Stated Goal: Return home with less pain PT Goal Formulation: With patient Time For Goal Achievement: 12/04/16 Potential to Achieve Goals: Good    Frequency 7X/week   Barriers to  discharge        Co-evaluation               AM-PAC PT "6 Clicks" Daily Activity  Outcome Measure Difficulty turning over in bed (including adjusting bedclothes, sheets and blankets)?: A Little Difficulty moving from lying on back to sitting on the side of the bed? : A Little Difficulty sitting down on and standing up from a chair with arms (e.g., wheelchair, bedside commode, etc,.)?: A Little Help needed moving to and from a bed to chair (including a wheelchair)?: A Little Help needed walking in hospital room?: A Little Help needed climbing 3-5 steps with a railing? : A Lot 6 Click Score: 17    End of Session Equipment Utilized During Treatment: Gait belt Activity Tolerance: Patient tolerated treatment well;Patient limited by pain Patient left: in chair;with call bell/phone within reach;with family/visitor present Nurse Communication: Mobility status PT Visit Diagnosis: Other abnormalities of gait and mobility (R26.89);Pain Pain - Right/Left: Right Pain - part of body: Knee    Time: 0900-0940 PT Time Calculation (min) (ACUTE ONLY): 40 min   Charges:   PT Evaluation $PT Eval Moderate Complexity: 1 Mod PT Treatments $Therapeutic Exercise: 8-22 mins $Therapeutic Activity: 8-22 mins   PT G Codes:       Mabeline Caras, PT, DPT Acute Rehab Services  Pager: [place] 11/20/2016, 10:09 AM

## 2016-11-20 NOTE — Care Management Note (Signed)
Case Management Note  Patient Details  Name: Thomas [name] MRN: [id number] Date of Birth: 1950/05/26  Subjective/Objective:   66 yr old gentleman s/p right total knee arthroplasty.                Action/Plan: Case manager spoke with patient and his wife concerning discharge plan and DME. Patient was preoperatively setup with [facility], no changes. He says he has RW and 3in1 at home, will have family support at discharge.   Expected Discharge Date:   11/21/16               Expected Discharge Plan:  [place]  In-House Referral:  NA  Discharge planning Services  CM Consult  Post Acute Care Choice:  Home Health Choice offered to:  Patient  DME Arranged:  N/A (Has RW and 3in1) DME Agency:  NA  HH Arranged:  PT [place] Agency:  [facility] (formerly [employer])  Status of Service:  Completed, signed off  If discussed at [employer], dates discussed:    Additional Comments:  Ninfa Meeker, RN 11/20/2016, 3:55 PM

## 2016-11-20 NOTE — Progress Notes (Signed)
Orthopedic Tech Progress Note Patient Details:  Thomas [name] [birth year] [id number]  CPM Right Knee CPM Right Knee: On Right Knee Flexion (Degrees): 90 Right Knee Extension (Degrees): -2 Additional Comments: pt need adjustment of cpm for comfort.  pt is satisfied with adjustment.    [name] 11/20/2016, 5:34 PM

## 2016-11-21 LAB — GLUCOSE, CAPILLARY: Glucose-Capillary: 249 mg/dL — ABNORMAL HIGH (ref 65–99)

## 2016-11-21 NOTE — Discharge Summary (Signed)
Discharge Diagnoses:  Active Problems:   Total knee replacement status   Surgeries: Procedure(s): RIGHT TOTAL KNEE ARTHROPLASTY on 11/19/2016    Consultants:   Discharged Condition: Improved  Hospital Course: Thomas [name] is an 66 y.o. male who was admitted 11/19/2016 with a chief complaint of osteoarthritis right knee, with a final diagnosis of osteoarthritis right knee.  Patient was brought to the operating room on 11/19/2016 and underwent Procedure(s): RIGHT TOTAL KNEE ARTHROPLASTY.    Patient was given perioperative antibiotics: Anti-infectives    Start     Dose/Rate Route Frequency Ordered Stop   11/19/16 2100  ceFAZolin (ANCEF) IVPB 2g/100 mL premix     2 g 200 mL/hr over 30 Minutes Intravenous Every 6 hours 11/19/16 1951 11/20/16 0435   11/19/16 1200  vancomycin (VANCOCIN) 1,500 mg in sodium chloride 0.9 % 500 mL IVPB     1,500 mg 250 mL/hr over 120 Minutes Intravenous To ShortStay Surgical 11/18/16 1028 11/19/16 1535    .  Patient was given sequential compression devices, early ambulation, and aspirin for DVT prophylaxis.  Recent vital signs: Patient Vitals for the past 24 hrs:  BP Temp Temp src Pulse Resp SpO2  11/21/16 0559 (!) 155/71 98.1 F (36.7 C) Oral 89 18 95 %  11/21/16 0045 136/65 98 F (36.7 C) Oral 79 18 94 %  11/20/16 2044 136/68 98.2 F (36.8 C) Oral 85 18 94 %  11/20/16 1556 131/66 98.8 F (37.1 C) Oral 91 18 92 %  .  Recent laboratory studies: Dg Knee Right Port  Result Date: 11/19/2016 CLINICAL DATA:  Post total knee arthroplasty. EXAM: PORTABLE RIGHT KNEE - 1-2 VIEW COMPARISON:  10/09/2016 FINDINGS: Evidence of patient's recent total right knee arthroplasty with components intact and normally located. Air and fluid over the knee joint. IMPRESSION: Expected changes post right total knee arthroplasty. Electronically Signed   By: Marin Olp M.D.   On: 11/19/2016 19:59    Discharge Medications:   Allergies as of 11/21/2016      Reactions    Lisinopril Cough      Medication List    TAKE these medications   AMBULATORY NON FORMULARY MEDICATION Full Face CPAP Mask with Tubing Dx: G47.30   amLODipine 10 MG tablet Commonly known as:  NORVASC Take 1 tablet (10 mg total) by mouth daily. For high blood pressure   aspirin EC 81 MG tablet Take 81 mg by mouth daily.   atorvastatin 10 MG tablet Commonly known as:  LIPITOR Take one tablet by mouth once daily for cholesterol   carvedilol 6.25 MG tablet Commonly known as:  COREG Take 1 tablet (6.25 mg total) by mouth 2 (two) times daily with a meal. For high blood pressure   diclofenac sodium 1 % Gel Commonly known as:  VOLTAREN Apply 2 g topically 4 (four) times daily. What changed:  when to take this  reasons to take this   empagliflozin 10 MG Tabs tablet Commonly known as:  JARDIANCE Take 10 mg by mouth daily.   glipiZIDE 5 MG tablet Commonly known as:  GLUCOTROL Take 1 tablet (5 mg total) by mouth daily before breakfast.   hydrALAZINE 25 MG tablet Commonly known as:  APRESOLINE Take 1 tablet (25 mg total) by mouth 3 (three) times daily.   losartan 100 MG tablet Commonly known as:  COZAAR Take 1 tablet (100 mg total) by mouth daily.   metFORMIN 500 MG tablet Commonly known as:  GLUCOPHAGE TAKE 1 TABLET BY MOUTH 2 TIMES  A DAY WITH A MEAL for diabetes   methocarbamol 750 MG tablet Commonly known as:  ROBAXIN Take 1 tablet (750 mg total) by mouth 2 (two) times daily as needed for muscle spasms.   omeprazole 20 MG capsule Commonly known as:  PRILOSEC Take 1 capsule (20 mg total) by mouth as needed.   ondansetron 4 MG tablet Commonly known as:  ZOFRAN Take 1-2 tablets (4-8 mg total) by mouth every 8 (eight) hours as needed for nausea or vomiting.   oxyCODONE 5 MG immediate release tablet Commonly known as:  Oxy IR/ROXICODONE Take 2 tablets (10 mg total) by mouth every 4 (four) hours as needed for moderate pain. What changed:  Another medication with the  same name was added. Make sure you understand how and when to take each.   oxyCODONE 5 MG immediate release tablet Commonly known as:  Oxy IR/ROXICODONE Take 1-3 tablets (5-15 mg total) by mouth every 4 (four) hours as needed. What changed:  You were already taking a medication with the same name, and this prescription was added. Make sure you understand how and when to take each.   oxyCODONE 10 mg 12 hr tablet Commonly known as:  OXYCONTIN Take 1 tablet (10 mg total) by mouth every 12 (twelve) hours. What changed:  You were already taking a medication with the same name, and this prescription was added. Make sure you understand how and when to take each.   potassium chloride SA 20 MEQ tablet Commonly known as:  K-DUR,KLOR-CON Take 20 mEq by mouth 2 (two) times daily.   promethazine 25 MG tablet Commonly known as:  PHENERGAN Take 1 tablet (25 mg total) by mouth every 6 (six) hours as needed for nausea.   rivaroxaban 20 MG Tabs tablet Commonly known as:  XARELTO Take 1 tablet (20 mg total) by mouth daily with supper.   senna-docusate 8.6-50 MG tablet Commonly known as:  SENOKOT S Take 1 tablet by mouth at bedtime as needed.   traMADol 50 MG tablet Commonly known as:  ULTRAM Take 1 tablet (50 mg total) by mouth every 6 (six) hours as needed. for pain   TRUE METRIX BLOOD GLUCOSE TEST test strip Generic drug:  glucose blood CHECK BLOOD SUGAR TWICE PER DAY   TRUEPLUS LANCETS 30G Misc USE TO TEST BLOOD SUGAR 2 TIMES A DAY            Durable Medical Equipment        Start     Ordered   11/19/16 1952  DME Walker rolling  Once    Question:  Patient needs a walker to treat with the following condition  Answer:  Total knee replacement status   11/19/16 1951   11/19/16 1952  DME 3 n 1  Once     11/19/16 1951   11/19/16 1952  DME Bedside commode  Once    Question:  Patient needs a bedside commode to treat with the following condition  Answer:  Total knee replacement status    11/19/16 1951      Diagnostic Studies: Dg Knee Right Port  Result Date: 11/19/2016 CLINICAL DATA:  Post total knee arthroplasty. EXAM: PORTABLE RIGHT KNEE - 1-2 VIEW COMPARISON:  10/09/2016 FINDINGS: Evidence of patient's recent total right knee arthroplasty with components intact and normally located. Air and fluid over the knee joint. IMPRESSION: Expected changes post right total knee arthroplasty. Electronically Signed   By: Marin Olp M.D.   On: 11/19/2016 19:59    Patient  benefited maximally from their hospital stay and there were no complications.     Disposition: 01-Home or Self Care Discharge Instructions    Call MD / Call 911    Complete by:  As directed    If you experience chest pain or shortness of breath, CALL 911 and be transported to the hospital emergency room.  If you develope a fever above 101 F, pus (white drainage) or increased drainage or redness at the wound, or calf pain, call your surgeon's office.   Constipation Prevention    Complete by:  As directed    Drink plenty of fluids.  Prune juice may be helpful.  You may use a stool softener, such as Colace (over the counter) 100 mg twice a day.  Use MiraLax (over the counter) for constipation as needed.   Diet - low sodium heart healthy    Complete by:  As directed    Increase activity slowly as tolerated    Complete by:  As directed      Follow-up Information    Leandrew Koyanagi, MD Follow up in 2 week(s).   Specialty:  Orthopedic Surgery Why:  For suture removal, For wound re-check Contact information: [place] [id number] [phone]        [facility].   Specialty:  [place] Why:  A representative from [facility] will contact you to arrange start date and time for your therapy. Contact information: [address] [place] [address] [phone]            Signed: Newt Minion 11/21/2016, 8:32 AM

## 2016-11-22 DIAGNOSIS — I129 Hypertensive chronic kidney disease with stage 1 through stage 4 chronic kidney disease, or unspecified chronic kidney disease: Secondary | ICD-10-CM | POA: Diagnosis not present

## 2016-11-22 DIAGNOSIS — N189 Chronic kidney disease, unspecified: Secondary | ICD-10-CM | POA: Diagnosis not present

## 2016-11-22 DIAGNOSIS — E1122 Type 2 diabetes mellitus with diabetic chronic kidney disease: Secondary | ICD-10-CM | POA: Diagnosis not present

## 2016-11-22 DIAGNOSIS — Z96651 Presence of right artificial knee joint: Secondary | ICD-10-CM | POA: Diagnosis not present

## 2016-11-22 DIAGNOSIS — Z471 Aftercare following joint replacement surgery: Secondary | ICD-10-CM | POA: Diagnosis not present

## 2016-11-22 DIAGNOSIS — E1140 Type 2 diabetes mellitus with diabetic neuropathy, unspecified: Secondary | ICD-10-CM | POA: Diagnosis not present

## 2016-11-23 ENCOUNTER — Telehealth: Payer: Self-pay

## 2016-11-23 ENCOUNTER — Telehealth (INDEPENDENT_AMBULATORY_CARE_PROVIDER_SITE_OTHER): Payer: Self-pay | Admitting: Orthopaedic Surgery

## 2016-11-23 MED FILL — PROMETHAZINE 25 MG TABLET: 25 | 8 days supply | Qty: 30 | Fill #0

## 2016-11-23 MED FILL — METHOCARBAMOL 750 MG TABLET: 750 | 30 days supply | Qty: 60 | Fill #0

## 2016-11-23 NOTE — Telephone Encounter (Signed)
Is this okay?

## 2016-11-23 NOTE — Telephone Encounter (Signed)
yes

## 2016-11-23 NOTE — Telephone Encounter (Signed)
Transition Care Management Follow-Up Telephone Call   Date discharged and where: [name] on 11/21/2016  How have you been since you were released from the hospital? Feeling well.  Any patient concerns? None. Physical therapy came and was successful.  Items Reviewed:   Meds: Y  Allergies:Y  Dietary Changes Reviewed:Y  Functional Questionnaire:  Independent-I Dependent-D  ADLs:   Dressing- I    Eating- I   Maintaining continence- I   Transferring- I w/ assistance of crutches and walker   Transportation- D   Meal Prep- D   Managing Meds- I   Confirmed importance and Date/Time of follow-up visits scheduled: Yes, pt will follow up with orthopedist   Confirmed with patient if condition worsens to call PCP or go to the Emergency Dept. Patient was given office number and encouraged to call back with questions or concerns: Yes

## 2016-11-23 NOTE — Telephone Encounter (Signed)
Called per Dr [name] he approved orders.

## 2016-11-23 NOTE — Telephone Encounter (Signed)
Gretchen from [facility] called this morning requesting vo for the patient.  4x for 1 week 1x for 1 week  CB#[phone].  Thank you

## 2016-11-24 ENCOUNTER — Ambulatory Visit (INDEPENDENT_AMBULATORY_CARE_PROVIDER_SITE_OTHER): Payer: Medicare Other | Admitting: Orthopaedic Surgery

## 2016-11-24 DIAGNOSIS — N189 Chronic kidney disease, unspecified: Secondary | ICD-10-CM | POA: Diagnosis not present

## 2016-11-24 DIAGNOSIS — Z471 Aftercare following joint replacement surgery: Secondary | ICD-10-CM | POA: Diagnosis not present

## 2016-11-24 DIAGNOSIS — E1140 Type 2 diabetes mellitus with diabetic neuropathy, unspecified: Secondary | ICD-10-CM | POA: Diagnosis not present

## 2016-11-24 DIAGNOSIS — Z96651 Presence of right artificial knee joint: Secondary | ICD-10-CM | POA: Diagnosis not present

## 2016-11-24 DIAGNOSIS — E1122 Type 2 diabetes mellitus with diabetic chronic kidney disease: Secondary | ICD-10-CM | POA: Diagnosis not present

## 2016-11-24 DIAGNOSIS — I129 Hypertensive chronic kidney disease with stage 1 through stage 4 chronic kidney disease, or unspecified chronic kidney disease: Secondary | ICD-10-CM | POA: Diagnosis not present

## 2016-11-25 DIAGNOSIS — I129 Hypertensive chronic kidney disease with stage 1 through stage 4 chronic kidney disease, or unspecified chronic kidney disease: Secondary | ICD-10-CM | POA: Diagnosis not present

## 2016-11-25 DIAGNOSIS — E1140 Type 2 diabetes mellitus with diabetic neuropathy, unspecified: Secondary | ICD-10-CM | POA: Diagnosis not present

## 2016-11-25 DIAGNOSIS — N189 Chronic kidney disease, unspecified: Secondary | ICD-10-CM | POA: Diagnosis not present

## 2016-11-25 DIAGNOSIS — Z96651 Presence of right artificial knee joint: Secondary | ICD-10-CM | POA: Diagnosis not present

## 2016-11-25 DIAGNOSIS — Z471 Aftercare following joint replacement surgery: Secondary | ICD-10-CM | POA: Diagnosis not present

## 2016-11-25 DIAGNOSIS — E1122 Type 2 diabetes mellitus with diabetic chronic kidney disease: Secondary | ICD-10-CM | POA: Diagnosis not present

## 2016-11-26 ENCOUNTER — Ambulatory Visit (INDEPENDENT_AMBULATORY_CARE_PROVIDER_SITE_OTHER): Payer: Medicare Other | Admitting: Orthopaedic Surgery

## 2016-11-30 ENCOUNTER — Telehealth (INDEPENDENT_AMBULATORY_CARE_PROVIDER_SITE_OTHER): Payer: Self-pay | Admitting: Orthopaedic Surgery

## 2016-11-30 DIAGNOSIS — Z471 Aftercare following joint replacement surgery: Secondary | ICD-10-CM | POA: Diagnosis not present

## 2016-11-30 DIAGNOSIS — N189 Chronic kidney disease, unspecified: Secondary | ICD-10-CM | POA: Diagnosis not present

## 2016-11-30 DIAGNOSIS — E1122 Type 2 diabetes mellitus with diabetic chronic kidney disease: Secondary | ICD-10-CM | POA: Diagnosis not present

## 2016-11-30 DIAGNOSIS — Z96651 Presence of right artificial knee joint: Secondary | ICD-10-CM | POA: Diagnosis not present

## 2016-11-30 DIAGNOSIS — E1140 Type 2 diabetes mellitus with diabetic neuropathy, unspecified: Secondary | ICD-10-CM | POA: Diagnosis not present

## 2016-11-30 DIAGNOSIS — I129 Hypertensive chronic kidney disease with stage 1 through stage 4 chronic kidney disease, or unspecified chronic kidney disease: Secondary | ICD-10-CM | POA: Diagnosis not present

## 2016-11-30 NOTE — Telephone Encounter (Signed)
yes

## 2016-11-30 NOTE — Telephone Encounter (Signed)
LMOM ok per orders

## 2016-11-30 NOTE — Telephone Encounter (Signed)
Is this okay?

## 2016-11-30 NOTE — Telephone Encounter (Signed)
Thomas [name] (PT) with [facility] called needing verbal orders to extend (PT) 2 wk 1 and 1 wk 1.  The number to contact patient is [phone]

## 2016-12-01 ENCOUNTER — Ambulatory Visit (INDEPENDENT_AMBULATORY_CARE_PROVIDER_SITE_OTHER): Payer: Medicare Other | Admitting: Orthopaedic Surgery

## 2016-12-01 ENCOUNTER — Encounter (INDEPENDENT_AMBULATORY_CARE_PROVIDER_SITE_OTHER): Payer: Self-pay | Admitting: Orthopaedic Surgery

## 2016-12-01 DIAGNOSIS — M1711 Unilateral primary osteoarthritis, right knee: Secondary | ICD-10-CM

## 2016-12-01 MED ORDER — OXYCODONE HCL 5 MG PO TABS: 5.0000 mg | ORAL_TABLET | Freq: Two times a day (BID) | ORAL | 0 refills | Status: DC | PRN

## 2016-12-01 MED FILL — oxyCODONE HCL 5 MG TABS: 5 | 13 days supply | Qty: 50 | Fill #0

## 2016-12-01 NOTE — Progress Notes (Signed)
Patient is 2 weeks as was right total knee replacement. He has finished home physical therapy. His pain is well-controlled with oxycodone. He denies any calf pain or shortness of breath. He takes relatively baseline. He doesn't have any real complaints today.  Right knee exam shows a healed incision without any signs of infection or drainage. He has expected postoperative swelling. Range of motion of the knee is coming along the somewhat limited secondary to swelling and pain.  At this point patient may begin to shower. Outpatient physical therapy referral was given today. Continue with his relative. Questions encouraged and answered. Follow-up in 4 weeks with 3 view x-rays of the right knee.

## 2016-12-03 DIAGNOSIS — M25662 Stiffness of left knee, not elsewhere classified: Secondary | ICD-10-CM | POA: Diagnosis not present

## 2016-12-03 DIAGNOSIS — M25661 Stiffness of right knee, not elsewhere classified: Secondary | ICD-10-CM | POA: Diagnosis not present

## 2016-12-03 DIAGNOSIS — M25561 Pain in right knee: Secondary | ICD-10-CM | POA: Diagnosis not present

## 2016-12-03 DIAGNOSIS — M25562 Pain in left knee: Secondary | ICD-10-CM | POA: Diagnosis not present

## 2016-12-08 DIAGNOSIS — M25562 Pain in left knee: Secondary | ICD-10-CM | POA: Diagnosis not present

## 2016-12-08 DIAGNOSIS — M25662 Stiffness of left knee, not elsewhere classified: Secondary | ICD-10-CM | POA: Diagnosis not present

## 2016-12-08 DIAGNOSIS — M25661 Stiffness of right knee, not elsewhere classified: Secondary | ICD-10-CM | POA: Diagnosis not present

## 2016-12-08 DIAGNOSIS — M25561 Pain in right knee: Secondary | ICD-10-CM | POA: Diagnosis not present

## 2016-12-09 DIAGNOSIS — M25562 Pain in left knee: Secondary | ICD-10-CM | POA: Diagnosis not present

## 2016-12-09 DIAGNOSIS — M25661 Stiffness of right knee, not elsewhere classified: Secondary | ICD-10-CM | POA: Diagnosis not present

## 2016-12-09 DIAGNOSIS — M25662 Stiffness of left knee, not elsewhere classified: Secondary | ICD-10-CM | POA: Diagnosis not present

## 2016-12-09 DIAGNOSIS — M25561 Pain in right knee: Secondary | ICD-10-CM | POA: Diagnosis not present

## 2016-12-10 ENCOUNTER — Other Ambulatory Visit (HOSPITAL_BASED_OUTPATIENT_CLINIC_OR_DEPARTMENT_OTHER): Payer: Medicare Other

## 2016-12-10 ENCOUNTER — Ambulatory Visit (HOSPITAL_BASED_OUTPATIENT_CLINIC_OR_DEPARTMENT_OTHER): Payer: Medicare Other | Admitting: Family

## 2016-12-10 VITALS — BP 153/75 | HR 70 | Temp 97.6°F | Resp 20 | Wt 311.0 lb

## 2016-12-10 DIAGNOSIS — C61 Malignant neoplasm of prostate: Secondary | ICD-10-CM | POA: Diagnosis present

## 2016-12-10 DIAGNOSIS — I2782 Chronic pulmonary embolism: Secondary | ICD-10-CM

## 2016-12-10 DIAGNOSIS — D6862 Lupus anticoagulant syndrome: Secondary | ICD-10-CM

## 2016-12-10 DIAGNOSIS — I2699 Other pulmonary embolism without acute cor pulmonale: Secondary | ICD-10-CM | POA: Diagnosis not present

## 2016-12-10 DIAGNOSIS — I824Y2 Acute embolism and thrombosis of unspecified deep veins of left proximal lower extremity: Secondary | ICD-10-CM

## 2016-12-10 LAB — CBC WITH DIFFERENTIAL (CANCER CENTER ONLY)
BASO#: 0 10*3/uL (ref 0.0–0.2)
BASO%: 0.5 % (ref 0.0–2.0)
EOS%: 0.5 % (ref 0.0–7.0)
Eosinophils Absolute: 0 10*3/uL (ref 0.0–0.5)
HCT: 39.1 % (ref 38.7–49.9)
HGB: 12.5 g/dL — ABNORMAL LOW (ref 13.0–17.1)
LYMPH#: 1.5 10*3/uL (ref 0.9–3.3)
LYMPH%: 23.2 % (ref 14.0–48.0)
MCH: 27.2 pg — ABNORMAL LOW (ref 28.0–33.4)
MCHC: 32 g/dL (ref 32.0–35.9)
MCV: 85 fL (ref 82–98)
MONO#: 0.5 10*3/uL (ref 0.1–0.9)
MONO%: 7.7 % (ref 0.0–13.0)
NEUT#: 4.4 10*3/uL (ref 1.5–6.5)
NEUT%: 68.1 % (ref 40.0–80.0)
Platelets: 319 10*3/uL (ref 145–400)
RBC: 4.59 10*6/uL (ref 4.20–5.70)
RDW: 14.9 % (ref 11.1–15.7)
WBC: 6.4 10*3/uL (ref 4.0–10.0)

## 2016-12-10 LAB — CMP (CANCER CENTER ONLY)
ALT(SGPT): 17 U/L (ref 10–47)
AST: 18 U/L (ref 11–38)
Albumin: 3.5 g/dL (ref 3.3–5.5)
Alkaline Phosphatase: 71 U/L (ref 26–84)
BUN, Bld: 17 mg/dL (ref 7–22)
CO2: 32 mEq/L (ref 18–33)
Calcium: 9.3 mg/dL (ref 8.0–10.3)
Chloride: 105 mEq/L (ref 98–108)
Creat: 1.1 mg/dl (ref 0.6–1.2)
Glucose, Bld: 217 mg/dL — ABNORMAL HIGH (ref 73–118)
Potassium: 4.3 mEq/L (ref 3.3–4.7)
Sodium: 149 mEq/L — ABNORMAL HIGH (ref 128–145)
Total Bilirubin: 0.9 mg/dl (ref 0.20–1.60)
Total Protein: 8.2 g/dL — ABNORMAL HIGH (ref 6.4–8.1)

## 2016-12-10 NOTE — Progress Notes (Signed)
Hematology and Oncology Follow Up Visit  Thomas [name] [id number] Nov 30, 1950 66 y.o. 12/10/2016   Principle Diagnosis:  Bilateral pulmonary emboli Left femoral vein and popliteal vein thrombus Positive lupus anticoagulant  Current Therapy:   Xarelto 20 mg by mouth daily - to complete 1 year therapy in October 2018   Interim History:  Thomas [name] is here today for follow-up. He just had a right knee replacement and did well. He is in PT and states that he is about to transition from a walker to a cane.  He has had no issue with bleeding, bruising or petechiae. His surgeon would like him to remain on full dose Xarelto for now since he is post op with history of thrombus.  No fever, chills, n/v, cough, rash, dizziness, SOB, chest pain, palpitations, abdominal pain or changes in bowel or bladder habits He self caths throughout the day.  Pedal pulses +1. The neuropathy in his feet is unchanged. No c/o pain at this time.  He has maintained a good appetite and is staying well hydrated. Both he and his wife have switched to a fish and vegetable diet, no other meats. His weight is stable.   ECOG Performance Status: 0 - Asymptomatic  Medications:  Allergies as of 12/10/2016      Reactions   Lisinopril Cough      Medication List       Accurate as of 12/10/16  8:11 AM. Always use your most recent med list.          AMBULATORY NON FORMULARY MEDICATION Full Face CPAP Mask with Tubing Dx: G47.30   amLODipine 10 MG tablet Commonly known as:  NORVASC Take 1 tablet (10 mg total) by mouth daily. For high blood pressure   aspirin EC 81 MG tablet Take 81 mg by mouth daily.   atorvastatin 10 MG tablet Commonly known as:  LIPITOR Take one tablet by mouth once daily for cholesterol   carvedilol 6.25 MG tablet Commonly known as:  COREG Take 1 tablet (6.25 mg total) by mouth 2 (two) times daily with a meal. For high blood pressure   diclofenac sodium 1 % Gel Commonly known as:   VOLTAREN Apply 2 g topically 4 (four) times daily.   empagliflozin 10 MG Tabs tablet Commonly known as:  JARDIANCE Take 10 mg by mouth daily.   glipiZIDE 5 MG tablet Commonly known as:  GLUCOTROL Take 1 tablet (5 mg total) by mouth daily before breakfast.   hydrALAZINE 25 MG tablet Commonly known as:  APRESOLINE Take 1 tablet (25 mg total) by mouth 3 (three) times daily.   losartan 100 MG tablet Commonly known as:  COZAAR Take 1 tablet (100 mg total) by mouth daily.   metFORMIN 500 MG tablet Commonly known as:  GLUCOPHAGE TAKE 1 TABLET BY MOUTH 2 TIMES A DAY WITH A MEAL for diabetes   methocarbamol 750 MG tablet Commonly known as:  ROBAXIN Take 1 tablet (750 mg total) by mouth 2 (two) times daily as needed for muscle spasms.   omeprazole 20 MG capsule Commonly known as:  PRILOSEC Take 1 capsule (20 mg total) by mouth as needed.   ondansetron 4 MG tablet Commonly known as:  ZOFRAN Take 1-2 tablets (4-8 mg total) by mouth every 8 (eight) hours as needed for nausea or vomiting.   oxyCODONE 5 MG immediate release tablet Commonly known as:  Oxy IR/ROXICODONE Take 2 tablets (10 mg total) by mouth every 4 (four) hours as needed for moderate pain.  oxyCODONE 5 MG immediate release tablet Commonly known as:  Oxy IR/ROXICODONE Take 1-3 tablets (5-15 mg total) by mouth every 4 (four) hours as needed.   oxyCODONE 10 mg 12 hr tablet Commonly known as:  OXYCONTIN Take 1 tablet (10 mg total) by mouth every 12 (twelve) hours.   oxyCODONE 5 MG immediate release tablet Commonly known as:  Oxy IR/ROXICODONE Take 1-2 tablets (5-10 mg total) by mouth 2 (two) times daily as needed.   potassium chloride SA 20 MEQ tablet Commonly known as:  K-DUR,KLOR-CON Take 20 mEq by mouth 2 (two) times daily.   promethazine 25 MG tablet Commonly known as:  PHENERGAN Take 1 tablet (25 mg total) by mouth every 6 (six) hours as needed for nausea.   rivaroxaban 20 MG Tabs tablet Commonly known  as:  XARELTO Take 1 tablet (20 mg total) by mouth daily with supper.   senna-docusate 8.6-50 MG tablet Commonly known as:  SENOKOT S Take 1 tablet by mouth at bedtime as needed.   traMADol 50 MG tablet Commonly known as:  ULTRAM Take 1 tablet (50 mg total) by mouth every 6 (six) hours as needed. for pain   TRUE METRIX BLOOD GLUCOSE TEST test strip Generic drug:  glucose blood CHECK BLOOD SUGAR TWICE PER DAY   TRUEPLUS LANCETS 30G Misc USE TO TEST BLOOD SUGAR 2 TIMES A DAY       Allergies:  Allergies  Allergen Reactions  . Lisinopril Cough    Past Medical History, Surgical history, Social history, and Family History were reviewed and updated.  Review of Systems: All other 10 point review of systems is negative.   Physical Exam:  weight is 311 lb (141.1 kg) (abnormal). His oral temperature is 97.6 F (36.4 C). His blood pressure is 153/75 (abnormal) and his pulse is 70. His respiration is 20 and oxygen saturation is 95%.   Wt Readings from Last 3 Encounters:  12/10/16 (!) 311 lb (141.1 kg)  11/19/16 (!) 325 lb (147.4 kg)  11/10/16 (!) 325 lb 8 oz (147.6 kg)    Ocular: Sclerae unicteric, pupils equal, round and reactive to light Ear-nose-throat: Oropharynx clear, dentition fair Lymphatic: No cervical, supraclavicular or axillary adenopathy Lungs no rales or rhonchi, good excursion bilaterally Heart regular rate and rhythm, no murmur appreciated Abd soft, nontender, positive bowel sounds, no liver or spleen tip palpated on exam, no fluid wave MSK no focal spinal tenderness, no joint edema Neuro: non-focal, well-oriented, appropriate affect Breasts: Deferred   Lab Results  Component Value Date   WBC 6.4 12/10/2016   HGB 12.5 (L) 12/10/2016   HCT 39.1 12/10/2016   MCV 85 12/10/2016   PLT 319 12/10/2016   Lab Results  Component Value Date   FERRITIN 194 03/08/2015   IRON 42 03/08/2015   Lab Results  Component Value Date   RBC 4.59 12/10/2016   No results  found for: KPAFRELGTCHN, LAMBDASER, KAPLAMBRATIO No results found for: IGGSERUM, IGA, IGMSERUM No results found for: Kathrynn Ducking, MSPIKE, SPEI   Chemistry      Component Value Date/Time   NA 134 (L) 11/20/2016 0626   NA 143 07/09/2016 1154   NA 141 04/07/2016 1130   K 4.1 11/20/2016 0626   K 3.6 07/09/2016 1154   K 4.1 04/07/2016 1130   CL 98 (L) 11/20/2016 0626   CL 103 07/09/2016 1154   CO2 30 11/20/2016 0626   CO2 29 07/09/2016 1154   CO2 22 04/07/2016 1130  BUN 14 11/20/2016 0626   BUN 14 07/09/2016 1154   BUN 12.2 04/07/2016 1130   CREATININE 1.35 (H) 11/20/2016 0626   CREATININE 1.04 09/22/2016 1032   CREATININE 1.6 (H) 04/07/2016 1130      Component Value Date/Time   CALCIUM 7.8 (L) 11/20/2016 0626   CALCIUM 8.9 07/09/2016 1154   CALCIUM 9.3 04/07/2016 1130   ALKPHOS 63 11/19/2016 1240   ALKPHOS 101 (H) 07/09/2016 1154   ALKPHOS 73 04/07/2016 1130   AST 15 11/19/2016 1240   AST 18 07/09/2016 1154   AST 14 04/07/2016 1130   ALT 21 11/19/2016 1240   ALT 26 07/09/2016 1154   ALT 21 04/07/2016 1130   BILITOT 1.0 11/19/2016 1240   BILITOT 0.80 07/09/2016 1154   BILITOT 0.71 04/07/2016 1130      Impression and Plan: Mr. Madariaga is a very pleasant 66 yo African American gentleman with left lower extremity thrombus and PE. He had a positive lupus anticoagulant.  He recently had a right knee replacement and will remain on full dose anticoagulation for the time being.  He is doing well and has no complaints at this time.  We will plan to see him back in another 4 months for repeat labs and follow-up.  He will contact our office with any questions or concerns. We can certainly see him sooner if need be.   Eliezer Bottom, NP 10/25/20188:11 AM\

## 2016-12-11 DIAGNOSIS — M25661 Stiffness of right knee, not elsewhere classified: Secondary | ICD-10-CM | POA: Diagnosis not present

## 2016-12-11 DIAGNOSIS — M25662 Stiffness of left knee, not elsewhere classified: Secondary | ICD-10-CM | POA: Diagnosis not present

## 2016-12-11 DIAGNOSIS — M25561 Pain in right knee: Secondary | ICD-10-CM | POA: Diagnosis not present

## 2016-12-11 DIAGNOSIS — M25562 Pain in left knee: Secondary | ICD-10-CM | POA: Diagnosis not present

## 2016-12-14 ENCOUNTER — Encounter: Payer: Self-pay | Admitting: Nurse Practitioner

## 2016-12-14 ENCOUNTER — Ambulatory Visit (INDEPENDENT_AMBULATORY_CARE_PROVIDER_SITE_OTHER): Payer: Medicare Other | Admitting: Nurse Practitioner

## 2016-12-14 VITALS — BP 146/88 | HR 75 | Temp 97.5°F | Resp 12 | Ht 67.0 in | Wt 310.6 lb

## 2016-12-14 DIAGNOSIS — I10 Essential (primary) hypertension: Secondary | ICD-10-CM

## 2016-12-14 DIAGNOSIS — H8110 Benign paroxysmal vertigo, unspecified ear: Secondary | ICD-10-CM

## 2016-12-14 DIAGNOSIS — E1122 Type 2 diabetes mellitus with diabetic chronic kidney disease: Secondary | ICD-10-CM | POA: Diagnosis not present

## 2016-12-14 DIAGNOSIS — Z23 Encounter for immunization: Secondary | ICD-10-CM | POA: Diagnosis not present

## 2016-12-14 MED ORDER — POTASSIUM CHLORIDE CRYS ER 20 MEQ PO TBCR: 20.0000 meq | EXTENDED_RELEASE_TABLET | Freq: Two times a day (BID) | ORAL | 1 refills | Status: DC

## 2016-12-14 MED ORDER — CARVEDILOL 6.25 MG PO TABS: 6.2500 mg | ORAL_TABLET | Freq: Two times a day (BID) | ORAL | 2 refills | Status: DC

## 2016-12-14 MED ORDER — OMEPRAZOLE 20 MG PO CPDR: 20.0000 mg | DELAYED_RELEASE_CAPSULE | ORAL | 1 refills | Status: DC | PRN

## 2016-12-14 MED ORDER — LOSARTAN POTASSIUM 100 MG PO TABS: 100.0000 mg | ORAL_TABLET | Freq: Every day | ORAL | 3 refills | Status: DC

## 2016-12-14 MED ORDER — HYDRALAZINE HCL 25 MG PO TABS: 25.0000 mg | ORAL_TABLET | Freq: Three times a day (TID) | ORAL | 3 refills | Status: DC

## 2016-12-14 MED FILL — OMEPRAZOLE 20 MG CAP: 20 | 30 days supply | Qty: 30 | Fill #0

## 2016-12-14 MED FILL — hydrALAZINE HCL 25 MG TABS: 25 | 90 days supply | Qty: 270 | Fill #0

## 2016-12-14 MED FILL — POTASSIUM CL ER 20 MEQ TABL: 20 | 90 days supply | Qty: 180 | Fill #0

## 2016-12-14 MED FILL — CARVEDILOL 6.25 MG TABLET: 6.25 | 90 days supply | Qty: 180 | Fill #0

## 2016-12-14 NOTE — Progress Notes (Signed)
Careteam: Patient Care Team: [name], NP as PCP - General (Geriatric Medicine)  Advanced Directive information    Allergies  Allergen Reactions  . Lisinopril Cough    Chief Complaint  Patient presents with  . Acute Visit    Ongoing Vertigo (was rx'ed Losartan, did not help, patient stopped) and + fall risk (currently in therapy) . Here with wife  . Medication Refill    Refill Omeprazole, potassium, coreg, and hydralizine   . Health Maintenance    DM foot exam due (Today), eye exam due (referral ordered). Patient questions if he is due for colonoscopy- last colonoscopy ([facility])   . Immunizations    Flu (Today), refused shingrix and TDaP      HPI: Patient is a 66 y.o. male seen in the office today due to vertigo. Feels like room is spinning. Started 4 days ago during therapy. He got off bike and went to a table, once he laid down it caused him to be so dizzy. He had to grab the table because he felt like the whole room was spinning. Last for a sort period- about 10 seconds but makes him very nauseous on the stomach. No vomiting.  Having several times a day.  Last episode was this morning.  Feeling nausea from this morning when the room was spinning. Happens we he moves his head.   Currently taking tramadol and oxycodone, using oxycodone when he goes out or to therapy.  Previously not routinely on oxycodone.   Feels like it is BPPV, he researched this.  Has not tired anything for it.    Muscle pain in lower back bilaterally. Notices this when he is bending over/after PT  Review of Systems:  Review of Systems  Constitutional: Negative for chills and fever.  HENT: Negative for congestion, ear discharge, ear pain, hearing loss, sinus pain, sore throat and tinnitus.   Musculoskeletal: Positive for joint pain.  Neurological: Positive for dizziness. Negative for sensory change, weakness and headaches.  Endo/Heme/Allergies: Negative for environmental allergies.      Past Medical History:  Diagnosis Date  . Arthritis   . Cancer [facility]) 2010   Prostate  . Chronic kidney disease   . Diabetes mellitus   . Diabetic neuropathy (HCC)    feet  . DVT (deep venous thrombosis) ([place])   . Genetic testing 06/22/2016   Mr. Cypert underwent genetic counseling and testing for hereditary cancer syndromes on 05/14/2016. His results were negative for mutations in all 46 genes analyzed by [facility]'s 46-gene Common Hereditary Cancers Panel. Genes analyzed include: APC, ATM, AXIN2, BARD1, BMPR1A, BRCA1, BRCA2, BRIP1, CDH1, CDKN2A, CHEK2, CTNNA1, DICER1, EPCAM, GREM1, HOXB13, KIT, MEN1, MLH1, MSH2, MSH3, MSH6, MUTYH, NB  . GERD (gastroesophageal reflux disease)   . Headache   . Hypertension   . PE (pulmonary thromboembolism) ([place])   . Pneumonia   . Sleep apnea    not wearing CPAP  . Wears glasses    Past Surgical History:  Procedure Laterality Date  . COLONOSCOPY    . HERNIA REPAIR    . KNEE SURGERY    . MULTIPLE TOOTH EXTRACTIONS    . PROSTATE SURGERY    . RADIOACTIVE SEED IMPLANT    . SHOULDER SURGERY    . TOTAL KNEE ARTHROPLASTY Right 11/19/2016   Procedure: RIGHT TOTAL KNEE ARTHROPLASTY;  Surgeon: Leandrew Koyanagi, MD;  Location: [place];  Service: Orthopedics;  Laterality: Right;  . uretha surgery-2014     Social History:  reports that he has never smoked. He has never used smokeless tobacco. He reports that he does not drink alcohol or use drugs.  Family History  Problem Relation Age of Onset  . Breast cancer Mother 58       d.89  . Breast cancer Sister 69       d.30  . Leukemia Brother 18       d.20  . Breast cancer Maternal Aunt 40       d.40s  . Lung cancer Maternal Uncle   . Prostate cancer Paternal Uncle   . Prostate cancer Brother        recurred recently at age 68  . Other Brother 15       spinal tumor  . Cervical cancer Other 22       d.22  . Cancer Sister 30       unspecified type  . Cancer Maternal Uncle 80       unspecified type     Medications: Patient's Medications  New Prescriptions   No medications on file  Previous Medications   AMBULATORY NON FORMULARY MEDICATION    Full Face CPAP Mask with Tubing Dx: G47.30   AMLODIPINE (NORVASC) 10 MG TABLET    Take 1 tablet (10 mg total) by mouth daily. For high blood pressure   ASPIRIN EC 81 MG TABLET    Take 81 mg by mouth daily.   ATORVASTATIN (LIPITOR) 10 MG TABLET    Take one tablet by mouth once daily for cholesterol   CARVEDILOL (COREG) 6.25 MG TABLET    Take 1 tablet (6.25 mg total) by mouth 2 (two) times daily with a meal. For high blood pressure   EMPAGLIFLOZIN (JARDIANCE) 10 MG TABS TABLET    Take 10 mg by mouth daily.   GLIPIZIDE (GLUCOTROL) 5 MG TABLET    Take 1 tablet (5 mg total) by mouth daily before breakfast.   HYDRALAZINE (APRESOLINE) 25 MG TABLET    Take 1 tablet (25 mg total) by mouth 3 (three) times daily.   METFORMIN (GLUCOPHAGE) 500 MG TABLET    TAKE 1 TABLET BY MOUTH 2 TIMES A DAY WITH A MEAL for diabetes   METHOCARBAMOL (ROBAXIN) 750 MG TABLET    Take 1 tablet (750 mg total) by mouth 2 (two) times daily as needed for muscle spasms.   OMEPRAZOLE (PRILOSEC) 20 MG CAPSULE    Take 1 capsule (20 mg total) by mouth as needed.   ONDANSETRON (ZOFRAN) 4 MG TABLET    Take 1-2 tablets (4-8 mg total) by mouth every 8 (eight) hours as needed for nausea or vomiting.   OXYCODONE (OXY IR/ROXICODONE) 5 MG IMMEDIATE RELEASE TABLET    Take 1-2 tablets (5-10 mg total) by mouth 2 (two) times daily as needed.   POTASSIUM CHLORIDE SA (K-DUR,KLOR-CON) 20 MEQ TABLET    Take 20 mEq by mouth 2 (two) times daily.   PROMETHAZINE (PHENERGAN) 25 MG TABLET    Take 1 tablet (25 mg total) by mouth every 6 (six) hours as needed for nausea.   RIVAROXABAN (XARELTO) 20 MG TABS TABLET    Take 1 tablet (20 mg total) by mouth daily with supper.   SENNA-DOCUSATE (SENOKOT S) 8.6-50 MG TABLET    Take 1 tablet by mouth at bedtime as needed.   TRAMADOL (ULTRAM) 50 MG TABLET    Take 1 tablet (50  mg total) by mouth every 6 (six) hours as needed. for pain   TRUE METRIX BLOOD GLUCOSE TEST  TEST STRIP    CHECK BLOOD SUGAR TWICE PER DAY   TRUEPLUS LANCETS 30G MISC    USE TO TEST BLOOD SUGAR 2 TIMES A DAY  Modified Medications   No medications on file  Discontinued Medications   DICLOFENAC SODIUM (VOLTAREN) 1 % GEL    Apply 2 g topically 4 (four) times daily.   LOSARTAN (COZAAR) 100 MG TABLET    Take 1 tablet (100 mg total) by mouth daily.   OXYCODONE (OXY IR/ROXICODONE) 5 MG IMMEDIATE RELEASE TABLET    Take 2 tablets (10 mg total) by mouth every 4 (four) hours as needed for moderate pain.   OXYCODONE (OXY IR/ROXICODONE) 5 MG IMMEDIATE RELEASE TABLET    Take 1-3 tablets (5-15 mg total) by mouth every 4 (four) hours as needed.   OXYCODONE (OXYCONTIN) 10 MG 12 HR TABLET    Take 1 tablet (10 mg total) by mouth every 12 (twelve) hours.     Physical Exam:  Vitals:   12/14/16 1241  BP: (!) 146/88  Pulse: 75  Resp: 12  Temp: (!) 97.5 F (36.4 C)  TempSrc: Oral  SpO2: 96%  Weight: (!) 310 lb 9.6 oz (140.9 kg)  Height: _0  (1.702 m)   Body mass index is 48.65 kg/m.  Physical Exam  Constitutional: He is oriented to person, place, and time. He appears well-developed and well-nourished.  Obese male, NAD  HENT:  Head: Normocephalic and atraumatic.  Right Ear: External ear normal.  Left Ear: External ear normal.  Nose: Nose normal.  Mouth/Throat: Oropharynx is clear and moist. No oropharyngeal exudate.  Eyes: Pupils are equal, round, and reactive to light. Conjunctivae and EOM are normal.  Neck: Normal range of motion. Neck supple. Carotid bruit is not present.  Cardiovascular: Normal rate, regular rhythm and normal heart sounds.   Pulmonary/Chest: Effort normal and breath sounds normal. No respiratory distress. He has no wheezes.  Abdominal: Soft. Bowel sounds are normal. He exhibits no abdominal bruit and no pulsatile midline mass.  Musculoskeletal: Normal range of motion. He  exhibits no edema or tenderness.       Arms: Slight tenderness noted on palpitation but notices more with movement.  Neurological: He is alert and oriented to person, place, and time. No sensory deficit.  Skin: Skin is warm and dry.  Psychiatric: He has a normal mood and affect. His behavior is normal. Judgment and thought content normal.    Labs reviewed: Basic Metabolic Panel:  Recent Labs  11/10/16 0908 11/20/16 0626 12/10/16 0752  NA 139 134* 149*  K 3.7 4.1 4.3  CL 103 98* 105  CO2 25 30 32  GLUCOSE 207* 202* 217*  BUN _1 CREATININE 1.21 1.35* 1.1  CALCIUM 8.9 7.8* 9.3   Liver Function Tests:  Recent Labs  09/22/16 1032 11/19/16 1240 12/10/16 0752  AST 8* 15 18  ALT 8* 21 17  ALKPHOS 91 63 71  BILITOT 0.6 1.0 0.90  PROT 7.2 7.2 8.2*  ALBUMIN 3.8 3.7 3.5   No results for input(s): LIPASE, AMYLASE in the last 8760 hours. No results for input(s): AMMONIA in the last 8760 hours. CBC:  Recent Labs  09/22/16 1032  11/19/16 1240 11/20/16 0626 12/10/16 0752  WBC 7.1  < > 6.2 8.5 6.4  NEUTROABS 5,325  --  4.6  --  4.4  HGB 14.3  < > 12.4* 11.8* 12.5*  HCT 46.0  < > 40.2 38.4* 39.1  MCV 85.3  < > 84.3 85.0  85  PLT 285  < > 270 244 319  < > = values in this interval not displayed. Lipid Panel:  Recent Labs  09/22/16 1032  CHOL 193  HDL 62  LDLCALC 112*  TRIG 95  CHOLHDL 3.1   TSH: No results for input(s): TSH in the last 8760 hours. A1C: Lab Results  Component Value Date   HGBA1C 8.0 (H) 10/27/2016     Assessment/Plan 1. Essential hypertension Not at goal. Instructed to restart losartan. Encouraged to continue lifestyle modifications.  - carvedilol (COREG) 6.25 MG tablet; Take 1 tablet (6.25 mg total) by mouth 2 (two) times daily with a meal. For high blood pressure  Dispense: 180 tablet; Refill: 2 - hydrALAZINE (APRESOLINE) 25 MG tablet; Take 1 tablet (25 mg total) by mouth 3 (three) times daily.  Dispense: 270 tablet; Refill: 3 -  losartan (COZAAR) 100 MG tablet; Take 1 tablet (100 mg total) by mouth daily.  Dispense: 90 tablet; Refill: 3  2. Benign paroxysmal positional vertigo, unspecified laterality -information given on epley maneuver.  -may use meclizine 12.5 mg -25 mg by mouth every 8 hours as needed vertigo. -currently going to PT and they can also assist with epley   3. Type 2 diabetes mellitus with chronic kidney disease, without long-term current use of insulin, unspecified CKD stage ([place]) -has lost weight due to diet. Will cont current regimen  -will get A1c at next OV - Ambulatory referral to Ophthalmology -educated on foot care.  -losartan restarted today   4. Need for immunization against influenza - Flu vaccine HIGH DOSE PF (Fluzone High dose)   Next appt: Dec 2018 Janett Billow K. Harle Battiest  [facility] [phone] 8 am - 5 pm) [phone] (after hours)

## 2016-12-14 NOTE — Patient Instructions (Addendum)
Meclizine 25 mg 1/2 tablet-1 tablet every 8 hours as needed for dizziness  Cont to use heat to back to help, cont good stretches and exercises with PT  PT can help you with the Epley Maneuver   How to Perform the Epley Maneuver The Epley maneuver is an exercise that relieves symptoms of vertigo. Vertigo is the feeling that you or your surroundings are moving when they are not. When you feel vertigo, you may feel like the room is spinning and have trouble walking. Dizziness is a little different than vertigo. When you are dizzy, you may feel unsteady or light-headed. You can do this maneuver at home whenever you have symptoms of vertigo. You can do it up to 3 times a day until your symptoms go away. Even though the Epley maneuver may relieve your vertigo for a few weeks, it is possible that your symptoms will return. This maneuver relieves vertigo, but it does not relieve dizziness. What are the risks? If it is done correctly, the Epley maneuver is considered safe. Sometimes it can lead to dizziness or nausea that goes away after a short time. If you develop other symptoms, such as changes in vision, weakness, or numbness, stop doing the maneuver and call your health care provider. How to perform the Epley maneuver 1. Sit on the edge of a bed or table with your back straight and your legs extended or hanging over the edge of the bed or table. 2. Turn your head halfway toward the affected ear or side. 3. Lie backward quickly with your head turned until you are lying flat on your back. You may want to position a pillow under your shoulders. 4. Hold this position for 30 seconds. You may experience an attack of vertigo. This is normal. 5. Turn your head to the opposite direction until your unaffected ear is facing the floor. 6. Hold this position for 30 seconds. You may experience an attack of vertigo. This is normal. Hold this position until the vertigo stops. 7. Turn your whole body to the same side  as your head. Hold for another 30 seconds. 8. Sit back up. You can repeat this exercise up to 3 times a day. Follow these instructions at home:  After doing the Epley maneuver, you can return to your normal activities.  Ask your health care provider if there is anything you should do at home to prevent vertigo. He or she may recommend that you: ? Keep your head raised (elevated) with two or more pillows while you sleep. ? Do not sleep on the side of your affected ear. ? Get up slowly from bed. ? Avoid sudden movements during the day. ? Avoid extreme head movement, like looking up or bending over. Contact a health care provider if:  Your vertigo gets worse.  You have other symptoms, including: ? Nausea. ? Vomiting. ? Headache. Get help right away if:  You have vision changes.  You have a severe or worsening headache or neck pain.  You cannot stop vomiting.  You have new numbness or weakness in any part of your body. Summary  Vertigo is the feeling that you or your surroundings are moving when they are not.  The Epley maneuver is an exercise that relieves symptoms of vertigo.  If the Epley maneuver is done correctly, it is considered safe. You can do it up to 3 times a day. This information is not intended to replace advice given to you by your health care provider. Make  sure you discuss any questions you have with your health care provider. Document Released: 02/07/2013 Document Revised: 12/24/2015 Document Reviewed: 12/24/2015 Elsevier Interactive Patient Education  2017 [employer].

## 2016-12-15 ENCOUNTER — Other Ambulatory Visit: Payer: Self-pay | Admitting: *Deleted

## 2016-12-15 MED ORDER — MECLIZINE HCL 12.5 MG PO TABS: ORAL_TABLET | ORAL | 0 refills | Status: DC

## 2016-12-15 NOTE — Telephone Encounter (Signed)
Patient called and stated that he saw Jessica yesterday and she was going to call in some Meclizine for him to take for his Vertigo. I reviewed last OV and it stated to use Meclizine as needed for vertigo. I updated patient's medication list and Faxed Rx to pharmacy.

## 2016-12-21 MED FILL — JARDIANCE 10 MG TABLET: 10 | 30 days supply | Qty: 30 | Fill #6

## 2016-12-22 DIAGNOSIS — M25661 Stiffness of right knee, not elsewhere classified: Secondary | ICD-10-CM | POA: Diagnosis not present

## 2016-12-22 DIAGNOSIS — M25562 Pain in left knee: Secondary | ICD-10-CM | POA: Diagnosis not present

## 2016-12-22 DIAGNOSIS — M25662 Stiffness of left knee, not elsewhere classified: Secondary | ICD-10-CM | POA: Diagnosis not present

## 2016-12-22 DIAGNOSIS — M25561 Pain in right knee: Secondary | ICD-10-CM | POA: Diagnosis not present

## 2016-12-24 DIAGNOSIS — M25561 Pain in right knee: Secondary | ICD-10-CM | POA: Diagnosis not present

## 2016-12-24 DIAGNOSIS — M25661 Stiffness of right knee, not elsewhere classified: Secondary | ICD-10-CM | POA: Diagnosis not present

## 2016-12-24 DIAGNOSIS — M25562 Pain in left knee: Secondary | ICD-10-CM | POA: Diagnosis not present

## 2016-12-24 DIAGNOSIS — M25662 Stiffness of left knee, not elsewhere classified: Secondary | ICD-10-CM | POA: Diagnosis not present

## 2016-12-29 ENCOUNTER — Telehealth: Payer: Self-pay

## 2016-12-29 DIAGNOSIS — M25662 Stiffness of left knee, not elsewhere classified: Secondary | ICD-10-CM | POA: Diagnosis not present

## 2016-12-29 DIAGNOSIS — M25561 Pain in right knee: Secondary | ICD-10-CM | POA: Diagnosis not present

## 2016-12-29 DIAGNOSIS — M25661 Stiffness of right knee, not elsewhere classified: Secondary | ICD-10-CM | POA: Diagnosis not present

## 2016-12-29 DIAGNOSIS — M25562 Pain in left knee: Secondary | ICD-10-CM | POA: Diagnosis not present

## 2016-12-29 NOTE — Telephone Encounter (Signed)
Patient called to say that his pharmacist stated there was a recall on losartan and he would like to know if he needs to stop taking the medication.   Please advise.

## 2016-12-29 NOTE — Telephone Encounter (Signed)
I spoke with patient's wife and she stated that patient's medication is not included in the lot number that is being recalled. She stated that patient would continue to take current Rx.

## 2016-12-29 NOTE — Telephone Encounter (Signed)
He needs to call pharmacy and see if the lot number of the medication he got was effective. It appears it was a recall on one lot of medication. Otherwise he should be okay to continue taking.

## 2016-12-31 ENCOUNTER — Ambulatory Visit (INDEPENDENT_AMBULATORY_CARE_PROVIDER_SITE_OTHER): Payer: Medicare Other | Admitting: Orthopaedic Surgery

## 2016-12-31 ENCOUNTER — Ambulatory Visit (INDEPENDENT_AMBULATORY_CARE_PROVIDER_SITE_OTHER): Payer: Medicare Other

## 2016-12-31 DIAGNOSIS — M25561 Pain in right knee: Secondary | ICD-10-CM | POA: Diagnosis not present

## 2016-12-31 DIAGNOSIS — M25562 Pain in left knee: Secondary | ICD-10-CM | POA: Diagnosis not present

## 2016-12-31 DIAGNOSIS — Z96651 Presence of right artificial knee joint: Secondary | ICD-10-CM

## 2016-12-31 DIAGNOSIS — M25662 Stiffness of left knee, not elsewhere classified: Secondary | ICD-10-CM | POA: Diagnosis not present

## 2016-12-31 DIAGNOSIS — M25661 Stiffness of right knee, not elsewhere classified: Secondary | ICD-10-CM | POA: Diagnosis not present

## 2016-12-31 NOTE — Progress Notes (Signed)
Patient is 6 weeks status post right total knee replacement and doing well.  He has lost about 20 pounds.  He is progressing with physical therapy.  He is very happy with his progress.  His surgical scar is fully healed.  He is ambulating without any assistive devices.  Range of motion is 10 degrees to 100 degrees.  X-rays show stable alignment of the right total knee replacement.  Patient is progressing well from my standpoint.  He may discontinue aspirin use.  Continue with physical therapy follow-up in 6 weeks for recheck.

## 2017-01-03 DIAGNOSIS — M25662 Stiffness of left knee, not elsewhere classified: Secondary | ICD-10-CM | POA: Diagnosis not present

## 2017-01-03 DIAGNOSIS — M25661 Stiffness of right knee, not elsewhere classified: Secondary | ICD-10-CM | POA: Diagnosis not present

## 2017-01-03 DIAGNOSIS — M25561 Pain in right knee: Secondary | ICD-10-CM | POA: Diagnosis not present

## 2017-01-03 DIAGNOSIS — M25562 Pain in left knee: Secondary | ICD-10-CM | POA: Diagnosis not present

## 2017-01-04 ENCOUNTER — Telehealth: Payer: Self-pay | Admitting: *Deleted

## 2017-01-04 NOTE — Telephone Encounter (Signed)
Received fax from [place] [phone] for CMN for Oxygen. Filled out and placed in folder for Janett Billow to review and sign. Printed last OV note and attached.

## 2017-01-06 DIAGNOSIS — M25562 Pain in left knee: Secondary | ICD-10-CM | POA: Diagnosis not present

## 2017-01-06 DIAGNOSIS — M25662 Stiffness of left knee, not elsewhere classified: Secondary | ICD-10-CM | POA: Diagnosis not present

## 2017-01-06 DIAGNOSIS — M25561 Pain in right knee: Secondary | ICD-10-CM | POA: Diagnosis not present

## 2017-01-06 DIAGNOSIS — M25661 Stiffness of right knee, not elsewhere classified: Secondary | ICD-10-CM | POA: Diagnosis not present

## 2017-01-12 DIAGNOSIS — M25661 Stiffness of right knee, not elsewhere classified: Secondary | ICD-10-CM | POA: Diagnosis not present

## 2017-01-12 DIAGNOSIS — M25561 Pain in right knee: Secondary | ICD-10-CM | POA: Diagnosis not present

## 2017-01-12 DIAGNOSIS — M25662 Stiffness of left knee, not elsewhere classified: Secondary | ICD-10-CM | POA: Diagnosis not present

## 2017-01-12 DIAGNOSIS — M25562 Pain in left knee: Secondary | ICD-10-CM | POA: Diagnosis not present

## 2017-01-15 DIAGNOSIS — M25561 Pain in right knee: Secondary | ICD-10-CM | POA: Diagnosis not present

## 2017-01-15 DIAGNOSIS — M25662 Stiffness of left knee, not elsewhere classified: Secondary | ICD-10-CM | POA: Diagnosis not present

## 2017-01-15 DIAGNOSIS — M25562 Pain in left knee: Secondary | ICD-10-CM | POA: Diagnosis not present

## 2017-01-15 DIAGNOSIS — M25661 Stiffness of right knee, not elsewhere classified: Secondary | ICD-10-CM | POA: Diagnosis not present

## 2017-01-15 NOTE — Telephone Encounter (Signed)
Janett Billow requested for [place] to call patient to confirm Oxygen usage. I called and spoke with patient and he stated he IS NOT doing Oxygen. Stated that he had already called [facility] to come pick up the tanks and they did. Paperwork not filled out due to patient not using Oxygen.

## 2017-01-15 NOTE — Telephone Encounter (Signed)
Thank you :)

## 2017-01-18 DIAGNOSIS — H04123 Dry eye syndrome of bilateral lacrimal glands: Secondary | ICD-10-CM | POA: Diagnosis not present

## 2017-01-18 DIAGNOSIS — H25813 Combined forms of age-related cataract, bilateral: Secondary | ICD-10-CM | POA: Diagnosis not present

## 2017-01-18 DIAGNOSIS — E113293 Type 2 diabetes mellitus with mild nonproliferative diabetic retinopathy without macular edema, bilateral: Secondary | ICD-10-CM | POA: Diagnosis not present

## 2017-01-18 LAB — HM DIABETES EYE EXAM

## 2017-01-19 DIAGNOSIS — M25662 Stiffness of left knee, not elsewhere classified: Secondary | ICD-10-CM | POA: Diagnosis not present

## 2017-01-19 DIAGNOSIS — M25561 Pain in right knee: Secondary | ICD-10-CM | POA: Diagnosis not present

## 2017-01-19 DIAGNOSIS — M25562 Pain in left knee: Secondary | ICD-10-CM | POA: Diagnosis not present

## 2017-01-19 DIAGNOSIS — Z8042 Family history of malignant neoplasm of prostate: Secondary | ICD-10-CM | POA: Diagnosis not present

## 2017-01-19 DIAGNOSIS — Z6841 Body Mass Index (BMI) 40.0 and over, adult: Secondary | ICD-10-CM | POA: Diagnosis not present

## 2017-01-19 DIAGNOSIS — E1165 Type 2 diabetes mellitus with hyperglycemia: Secondary | ICD-10-CM | POA: Diagnosis not present

## 2017-01-19 DIAGNOSIS — Z8546 Personal history of malignant neoplasm of prostate: Secondary | ICD-10-CM | POA: Diagnosis not present

## 2017-01-19 DIAGNOSIS — M25661 Stiffness of right knee, not elsewhere classified: Secondary | ICD-10-CM | POA: Diagnosis not present

## 2017-01-19 DIAGNOSIS — N320 Bladder-neck obstruction: Secondary | ICD-10-CM | POA: Diagnosis not present

## 2017-01-19 DIAGNOSIS — N3941 Urge incontinence: Secondary | ICD-10-CM | POA: Diagnosis not present

## 2017-01-19 DIAGNOSIS — R350 Frequency of micturition: Secondary | ICD-10-CM | POA: Diagnosis not present

## 2017-01-19 DIAGNOSIS — R358 Other polyuria: Secondary | ICD-10-CM | POA: Diagnosis not present

## 2017-01-19 DIAGNOSIS — R3915 Urgency of urination: Secondary | ICD-10-CM | POA: Diagnosis not present

## 2017-01-19 DIAGNOSIS — N3944 Nocturnal enuresis: Secondary | ICD-10-CM | POA: Diagnosis not present

## 2017-01-21 ENCOUNTER — Ambulatory Visit: Payer: Medicare Other | Admitting: Nurse Practitioner

## 2017-01-21 ENCOUNTER — Other Ambulatory Visit: Payer: Self-pay | Admitting: Nurse Practitioner

## 2017-01-21 NOTE — Telephone Encounter (Signed)
Patient requested 90 day Rx

## 2017-01-27 ENCOUNTER — Other Ambulatory Visit: Payer: Medicare Other

## 2017-01-27 DIAGNOSIS — E1122 Type 2 diabetes mellitus with diabetic chronic kidney disease: Secondary | ICD-10-CM | POA: Diagnosis not present

## 2017-01-28 ENCOUNTER — Ambulatory Visit: Payer: Medicare Other | Admitting: Nurse Practitioner

## 2017-01-28 LAB — HEMOGLOBIN A1C
Hgb A1c MFr Bld: 6.7 % of total Hgb — ABNORMAL HIGH (ref <5.7)
Mean Plasma Glucose: 146 (calc)
eAG (mmol/L): 8.1 (calc)

## 2017-01-29 ENCOUNTER — Encounter: Payer: Self-pay | Admitting: *Deleted

## 2017-02-01 MED FILL — OXYBUTYNIN CL ER 5 MG TAB: 5 | 30 days supply | Qty: 30 | Fill #0

## 2017-02-02 DIAGNOSIS — M25561 Pain in right knee: Secondary | ICD-10-CM | POA: Diagnosis not present

## 2017-02-02 DIAGNOSIS — M25662 Stiffness of left knee, not elsewhere classified: Secondary | ICD-10-CM | POA: Diagnosis not present

## 2017-02-02 DIAGNOSIS — M25562 Pain in left knee: Secondary | ICD-10-CM | POA: Diagnosis not present

## 2017-02-02 DIAGNOSIS — M25661 Stiffness of right knee, not elsewhere classified: Secondary | ICD-10-CM | POA: Diagnosis not present

## 2017-02-04 ENCOUNTER — Encounter: Payer: Self-pay | Admitting: Nurse Practitioner

## 2017-02-04 ENCOUNTER — Telehealth: Payer: Self-pay

## 2017-02-04 ENCOUNTER — Ambulatory Visit (INDEPENDENT_AMBULATORY_CARE_PROVIDER_SITE_OTHER): Payer: Medicare Other | Admitting: Nurse Practitioner

## 2017-02-04 VITALS — BP 138/72 | HR 78 | Temp 98.1°F | Resp 17 | Ht 67.0 in | Wt 308.4 lb

## 2017-02-04 DIAGNOSIS — M170 Bilateral primary osteoarthritis of knee: Secondary | ICD-10-CM

## 2017-02-04 DIAGNOSIS — M25661 Stiffness of right knee, not elsewhere classified: Secondary | ICD-10-CM | POA: Diagnosis not present

## 2017-02-04 DIAGNOSIS — E785 Hyperlipidemia, unspecified: Secondary | ICD-10-CM

## 2017-02-04 DIAGNOSIS — E1122 Type 2 diabetes mellitus with diabetic chronic kidney disease: Secondary | ICD-10-CM | POA: Diagnosis not present

## 2017-02-04 DIAGNOSIS — R42 Dizziness and giddiness: Secondary | ICD-10-CM | POA: Diagnosis not present

## 2017-02-04 DIAGNOSIS — I10 Essential (primary) hypertension: Secondary | ICD-10-CM

## 2017-02-04 DIAGNOSIS — M25562 Pain in left knee: Secondary | ICD-10-CM | POA: Diagnosis not present

## 2017-02-04 DIAGNOSIS — M25561 Pain in right knee: Secondary | ICD-10-CM | POA: Diagnosis not present

## 2017-02-04 DIAGNOSIS — M25662 Stiffness of left knee, not elsewhere classified: Secondary | ICD-10-CM | POA: Diagnosis not present

## 2017-02-04 DIAGNOSIS — M549 Dorsalgia, unspecified: Secondary | ICD-10-CM | POA: Diagnosis not present

## 2017-02-04 LAB — COMPLETE METABOLIC PANEL WITH GFR
AG Ratio: 1.2 (calc) (ref 1.0–2.5)
ALT: 7 U/L — ABNORMAL LOW (ref 9–46)
AST: 8 U/L — ABNORMAL LOW (ref 10–35)
Albumin: 3.9 g/dL (ref 3.6–5.1)
Alkaline phosphatase (APISO): 82 U/L (ref 40–115)
BUN: 16 mg/dL (ref 7–25)
CO2: 30 mmol/L (ref 20–32)
Calcium: 9.1 mg/dL (ref 8.6–10.3)
Chloride: 101 mmol/L (ref 98–110)
Creat: 1.07 mg/dL (ref 0.70–1.25)
GFR, Est African American: 83 mL/min/{1.73_m2} (ref >=60)
GFR, Est Non African American: 72 mL/min/{1.73_m2} (ref >=60)
Globulin: 3.3 g/dL (calc) (ref 1.9–3.7)
Glucose, Bld: 255 mg/dL — ABNORMAL HIGH (ref 65–139)
Potassium: 3.9 mmol/L (ref 3.5–5.3)
Sodium: 142 mmol/L (ref 135–146)
Total Bilirubin: 0.6 mg/dL (ref 0.2–1.2)
Total Protein: 7.2 g/dL (ref 6.1–8.1)

## 2017-02-04 LAB — CBC WITH DIFFERENTIAL/PLATELET
Basophils Absolute: 52 cells/uL (ref 0–200)
Basophils Relative: 0.8 %
Eosinophils Absolute: 13 cells/uL — ABNORMAL LOW (ref 15–500)
Eosinophils Relative: 0.2 %
HCT: 41.1 % (ref 38.5–50.0)
Hemoglobin: 13.1 g/dL — ABNORMAL LOW (ref 13.2–17.1)
Lymphs Abs: 1606 cells/uL (ref 850–3900)
MCH: 25.9 pg — ABNORMAL LOW (ref 27.0–33.0)
MCHC: 31.9 g/dL — ABNORMAL LOW (ref 32.0–36.0)
MCV: 81.4 fL (ref 80.0–100.0)
MPV: 11.3 fL (ref 7.5–12.5)
Monocytes Relative: 6.3 %
Neutro Abs: 4420 cells/uL (ref 1500–7800)
Neutrophils Relative %: 68 %
Platelets: 289 10*3/uL (ref 140–400)
RBC: 5.05 10*6/uL (ref 4.20–5.80)
RDW: 13.4 % (ref 11.0–15.0)
Total Lymphocyte: 24.7 %
WBC mixed population: 410 cells/uL (ref 200–950)
WBC: 6.5 10*3/uL (ref 3.8–10.8)

## 2017-02-04 LAB — TSH: TSH: 1.08 mIU/L (ref 0.40–4.50)

## 2017-02-04 MED ORDER — AMLODIPINE BESYLATE 10 MG PO TABS: 10.0000 mg | ORAL_TABLET | Freq: Every day | ORAL | 1 refills | Status: DC

## 2017-02-04 MED ORDER — GLIPIZIDE 5 MG PO TABS: 5.0000 mg | ORAL_TABLET | Freq: Every day | ORAL | 1 refills | Status: DC

## 2017-02-04 MED ORDER — ATORVASTATIN CALCIUM 10 MG PO TABS: ORAL_TABLET | ORAL | 1 refills | Status: DC

## 2017-02-04 MED ORDER — TRAMADOL HCL 50 MG PO TABS: 50.0000 mg | ORAL_TABLET | Freq: Four times a day (QID) | ORAL | 0 refills | Status: DC | PRN

## 2017-02-04 MED FILL — ATORVASTATIN 10 MG TABLET: 10 | 90 days supply | Qty: 90 | Fill #0

## 2017-02-04 MED FILL — AMLODIPINE BESYLATE 10 MG T: 10 | 90 days supply | Qty: 90 | Fill #0

## 2017-02-04 MED FILL — glipiZIDE 5 MG TABS: 5 | 90 days supply | Qty: 90 | Fill #0

## 2017-02-04 MED FILL — traMADol HCL 50 MG TABS: 50 | 8 days supply | Qty: 30 | Fill #0

## 2017-02-04 NOTE — Telephone Encounter (Signed)
Appointment scheduled, orders placed

## 2017-02-04 NOTE — Telephone Encounter (Signed)
-----   Message from [name], NP sent at 02/04/2017  1:33 PM EST ----- Pt needs labs at next appt, does he want to come fasting or get them done in advance? If so I can put these orders in and he will need a scheduled lab appt. thanks

## 2017-02-04 NOTE — Progress Notes (Signed)
Careteam: Patient Care Team: [name], NP as PCP - General (Geriatric Medicine) Debbra Riding, MD as Consulting Physician (Ophthalmology)    Allergies  Allergen Reactions  . Lisinopril Cough    Chief Complaint  Patient presents with  . Medical Management of Chronic Issues    Pt is being seen for a 3 month routine visit and to discuss lab results.   . Medication Refill    Multiple medication refills including tramadol     HPI: Patient is a 66 y.o. male seen in the office today for 3 month follow up.  Pt with hx of uncontrolled diabetes, BPPV, urinary frequency.  Seeing urologist at [place] who is doing testing on his bladder.  Still having vertigo- attempted meclizine but not effective. Has been going on for 2 months. Last 30-45 sec multiple times a day.  Effects him when he is moving his head, sitting up or laying down.  Concerned that something is going on with his head. Used to break things with his head.  Pain in back has not gone away Has PT which have been working with his right knee after knee replacement.  Has not worked with them regarding his back which has been bothering him since knee replacement PT is on 2nd floor of [facility]; physical therapy and hand specialist- [phone] Pain in knee and back- using tramadol about once a day due to this.  Had oxycodone by ortho but he is done taking this and now requesting refill on tramadol.    Review of Systems:  Review of Systems  Constitutional: Negative for chills, fever and malaise/fatigue.  HENT: Negative for congestion, ear discharge, ear pain, hearing loss, sinus pain, sore throat and tinnitus.   Eyes: Negative for blurred vision.  Respiratory: Negative for cough and shortness of breath.   Cardiovascular: Negative for chest pain and palpitations.  Genitourinary: Positive for frequency (following by urology).  Musculoskeletal: Positive for joint pain and myalgias.    Neurological: Positive for dizziness and tingling (due to diabetes). Negative for sensory change, weakness and headaches.       Occasional sharp pains in head- lasting seconds Happens about 1-2 times a month.   Endo/Heme/Allergies: Negative for environmental allergies.  Psychiatric/Behavioral: Negative for memory loss.    Past Medical History:  Diagnosis Date  . Arthritis   . Cancer [facility]) 2010   Prostate  . Chronic kidney disease   . Diabetes mellitus   . Diabetic neuropathy (HCC)    feet  . DVT (deep venous thrombosis) ([place])   . Genetic testing 06/22/2016   Mr. Biskup underwent genetic counseling and testing for hereditary cancer syndromes on 05/14/2016. His results were negative for mutations in all 46 genes analyzed by [facility]'s 46-gene Common Hereditary Cancers Panel. Genes analyzed include: APC, ATM, AXIN2, BARD1, BMPR1A, BRCA1, BRCA2, BRIP1, CDH1, CDKN2A, CHEK2, CTNNA1, DICER1, EPCAM, GREM1, HOXB13, KIT, MEN1, MLH1, MSH2, MSH3, MSH6, MUTYH, NB  . GERD (gastroesophageal reflux disease)   . Headache   . Hypertension   . PE (pulmonary thromboembolism) ([place])   . Pneumonia   . Sleep apnea    not wearing CPAP  . Wears glasses    Past Surgical History:  Procedure Laterality Date  . COLONOSCOPY    . HERNIA REPAIR    . KNEE SURGERY    . MULTIPLE TOOTH EXTRACTIONS    . PROSTATE SURGERY    . RADIOACTIVE SEED IMPLANT    . SHOULDER SURGERY    .  TOTAL KNEE ARTHROPLASTY Right 11/19/2016   Procedure: RIGHT TOTAL KNEE ARTHROPLASTY;  Surgeon: Leandrew Koyanagi, MD;  Location: [facility];  Service: Orthopedics;  Laterality: Right;  . uretha surgery-2014     Social History:   reports that  has never smoked. he has never used smokeless tobacco. He reports that he does not drink alcohol or use drugs.  Family History  Problem Relation Age of Onset  . Breast cancer Mother 12       d.89  . Breast cancer Sister 40       d.30  . Leukemia Brother 18       d.20  . Breast cancer Maternal Aunt 40        d.40s  . Lung cancer Maternal Uncle   . Prostate cancer Paternal Uncle   . Prostate cancer Brother        recurred recently at age 61  . Other Brother 15       spinal tumor  . Cervical cancer Other 22       d.22  . Cancer Sister 53       unspecified type  . Cancer Maternal Uncle 80       unspecified type    Medications:   Medication List        Accurate as of 02/04/17 10:08 AM. Always use your most recent med list.          AMBULATORY NON FORMULARY MEDICATION Full Face CPAP Mask with Tubing Dx: G47.30   amLODipine 10 MG tablet Commonly known as:  NORVASC Take 1 tablet (10 mg total) by mouth daily. For high blood pressure   aspirin EC 81 MG tablet   atorvastatin 10 MG tablet Commonly known as:  LIPITOR Take one tablet by mouth once daily for cholesterol   carvedilol 6.25 MG tablet Commonly known as:  COREG Take 1 tablet (6.25 mg total) by mouth 2 (two) times daily with a meal. For high blood pressure   glipiZIDE 5 MG tablet Commonly known as:  GLUCOTROL Take 1 tablet (5 mg total) by mouth daily before breakfast.   hydrALAZINE 25 MG tablet Commonly known as:  APRESOLINE Take 1 tablet (25 mg total) by mouth 3 (three) times daily.   JARDIANCE 10 MG Tabs tablet Generic drug:  empagliflozin TAKE 1 TABLET BY MOUTH ONCE DAILY   losartan 100 MG tablet Commonly known as:  COZAAR Take 1 tablet (100 mg total) by mouth daily.   meclizine 12.5 MG tablet Commonly known as:  ANTIVERT Take one to two tablets by mouth every 8 hours as needed for vertigo   metFORMIN 500 MG tablet Commonly known as:  GLUCOPHAGE TAKE 1 TABLET BY MOUTH 2 TIMES A DAY WITH A MEAL for diabetes   omeprazole 20 MG capsule Commonly known as:  PRILOSEC Take 1 capsule (20 mg total) by mouth as needed.   ondansetron 4 MG tablet Commonly known as:  ZOFRAN Take 1-2 tablets (4-8 mg total) by mouth every 8 (eight) hours as needed for nausea or vomiting.   oxybutynin 5 MG tablet Commonly  known as:  DITROPAN   potassium chloride SA 20 MEQ tablet Commonly known as:  K-DUR,KLOR-CON Take 1 tablet (20 mEq total) by mouth 2 (two) times daily.   rivaroxaban 20 MG Tabs tablet Commonly known as:  XARELTO Take 1 tablet (20 mg total) by mouth daily with supper.   senna-docusate 8.6-50 MG tablet Commonly known as:  SENOKOT S Take 1 tablet by mouth at  bedtime as needed.   traMADol 50 MG tablet Commonly known as:  ULTRAM Take 1 tablet (50 mg total) by mouth every 6 (six) hours as needed. for pain   TRUE METRIX BLOOD GLUCOSE TEST test strip Generic drug:  glucose blood CHECK BLOOD SUGAR TWICE PER DAY   TRUEPLUS LANCETS 30G Misc USE TO TEST BLOOD SUGAR 2 TIMES A DAY        Physical Exam:  Vitals:   02/04/17 0936  BP: 138/72  Pulse: 78  Resp: 17  Temp: 98.1 F (36.7 C)  TempSrc: Oral  SpO2: 95%  Weight: (!) 308 lb 6.4 oz (139.9 kg)  Height: '5\' 7"'$  (1.702 m)   Body mass index is 48.3 kg/m.  Physical Exam  Constitutional: He is oriented to person, place, and time. He appears well-developed and well-nourished. No distress.  HENT:  Head: Normocephalic and atraumatic.  Right Ear: External ear normal.  Left Ear: External ear normal.  Mouth/Throat: Oropharynx is clear and moist. No oropharyngeal exudate.  Eyes: Conjunctivae and EOM are normal. Pupils are equal, round, and reactive to light.  Neck: Normal range of motion. Neck supple.  Cardiovascular: Normal rate, regular rhythm and normal heart sounds.  Pulmonary/Chest: Effort normal and breath sounds normal.  Abdominal: Soft. Bowel sounds are normal.  Musculoskeletal: Normal range of motion. He exhibits no edema or tenderness.       Arms: Tenderness to right lateral back, mild tenderness on exam but increase pain with sitting up and laying down  Neurological: He is alert and oriented to person, place, and time.  Skin: Skin is warm and dry. He is not diaphoretic.  Psychiatric: He has a normal mood and affect.     Labs reviewed: Basic Metabolic Panel: Recent Labs    11/10/16 0908 11/20/16 0626 12/10/16 0752  NA 139 134* 149*  K 3.7 4.1 4.3  CL 103 98* 105  CO2 25 30 32  GLUCOSE 207* 202* 217*  BUN '18 14 17  '$ CREATININE 1.21 1.35* 1.1  CALCIUM 8.9 7.8* 9.3   Liver Function Tests: Recent Labs    09/22/16 1032 11/19/16 1240 12/10/16 0752  AST 8* 15 18  ALT 8* 21 17  ALKPHOS 91 63 71  BILITOT 0.6 1.0 0.90  PROT 7.2 7.2 8.2*  ALBUMIN 3.8 3.7 3.5   No results for input(s): LIPASE, AMYLASE in the last 8760 hours. No results for input(s): AMMONIA in the last 8760 hours. CBC: Recent Labs    09/22/16 1032  11/19/16 1240 11/20/16 0626 12/10/16 0752  WBC 7.1   < > 6.2 8.5 6.4  NEUTROABS 5,325  --  4.6  --  4.4  HGB 14.3   < > 12.4* 11.8* 12.5*  HCT 46.0   < > 40.2 38.4* 39.1  MCV 85.3   < > 84.3 85.0 85  PLT 285   < > 270 244 319   < > = values in this interval not displayed.   Lipid Panel: Recent Labs    09/22/16 1032  CHOL 193  HDL 62  LDLCALC 112*  TRIG 95  CHOLHDL 3.1   TSH: No results for input(s): TSH in the last 8760 hours. A1C: Lab Results  Component Value Date   HGBA1C 6.7 (H) 01/27/2017     Assessment/Plan 1. Essential hypertension -no orthostatic, blood pressure actually went up which suspect due to pain with movement.  -blood pressure stable otherwise, will cont current regimen.  - amLODipine (NORVASC) 10 MG tablet; Take 1 tablet (10  mg total) by mouth daily. For high blood pressure  Dispense: 90 tablet; Refill: 1  2. Hyperlipidemia with target LDL less than 100 LDL up 4 months ago but has since worked on diet and exercise. Will cont current regimen and have him follow up lipids prior to next OV - atorvastatin (LIPITOR) 10 MG tablet; Take one tablet by mouth once daily for cholesterol  Dispense: 90 tablet; Refill: 1  3. Primary osteoarthritis of both knees S/p total knee replacement, conts to do rehab which has been going well. Rarely uses  tramadol.  - traMADol (ULTRAM) 50 MG tablet; Take 1 tablet (50 mg total) by mouth every 6 (six) hours as needed. for pain  Dispense: 30 tablet; Refill: 0  4. Vertigo Appears to be BPPV however pt concerned over hx of using his head to break things when he was younger.  -pt has also been having sharp pain in head that come and go.  -PT consulted for vestibular rehab as well - CBC with Differential/Platelets - CMP with eGFR - TSH - CT Head Wo Contrast; Future  5. Type 2 diabetes mellitus with chronic kidney disease, without long-term current use of insulin, unspecified CKD stage ([place]) -a1c at goal with diet changes and exercise. To cont current regimen, samples of Jardiance 10 mg given hopefully will be affordable in the new year - glipiZIDE (GLUCOTROL) 5 MG tablet; Take 1 tablet (5 mg total) by mouth daily before breakfast.  Dispense: 90 tablet; Refill: 1 - TSH  6. Acute right-sided back pain, unspecified back location -ongoing since knee surgery. Cont to use heat and muscle rubs. -orders send to PT for strengthening and to help with pain. -to notify if this persist.    Next appt: 3 months, sooner if needed Silvie Obremski K. Harle Battiest  [facility] [phone] 8 am - 5 pm) [phone] (after hours)

## 2017-02-04 NOTE — Patient Instructions (Signed)
cetirizine 10 mg daily

## 2017-02-04 NOTE — Telephone Encounter (Signed)
I left a message asking that patient call the office to schedule a fasting lab appointment prior to his next office visit.

## 2017-02-10 ENCOUNTER — Ambulatory Visit
Admission: RE | Admit: 2017-02-10 | Discharge: 2017-02-10 | Disposition: A | Discharge status: Home / Self Care | Payer: Medicare Other | Source: Ambulatory Visit | Attending: Nurse Practitioner | Admitting: Nurse Practitioner

## 2017-02-10 DIAGNOSIS — R42 Dizziness and giddiness: Secondary | ICD-10-CM

## 2017-02-11 ENCOUNTER — Ambulatory Visit (INDEPENDENT_AMBULATORY_CARE_PROVIDER_SITE_OTHER): Payer: Medicare Other | Admitting: Orthopaedic Surgery

## 2017-02-11 ENCOUNTER — Encounter (INDEPENDENT_AMBULATORY_CARE_PROVIDER_SITE_OTHER): Payer: Self-pay | Admitting: Orthopaedic Surgery

## 2017-02-11 DIAGNOSIS — R339 Retention of urine, unspecified: Secondary | ICD-10-CM | POA: Diagnosis not present

## 2017-02-11 DIAGNOSIS — Z96651 Presence of right artificial knee joint: Secondary | ICD-10-CM

## 2017-02-11 DIAGNOSIS — N3944 Nocturnal enuresis: Secondary | ICD-10-CM | POA: Diagnosis not present

## 2017-02-11 MED ORDER — AMOXICILLIN 500 MG PO CAPS: 2000.0000 mg | ORAL_CAPSULE | Freq: Once | ORAL | 6 refills | Status: AC

## 2017-02-11 MED FILL — AMOXICILLIN 500 MG CAPSULE: 500 | 1 days supply | Qty: 4 | Fill #0

## 2017-02-11 NOTE — Progress Notes (Signed)
Patient is 10 weeks status post right total knee replacement.  He is overall doing well.  He has lost 30 pounds overall.  His hemoglobin A1c is below 7.  Surgical scar is fully healed.  He does have a 10 degree flexion contracture.  Flexion is to 95 degrees.  He continues to work with physical therapy.  Overall he is very happy with his progress and his knee replacement.  From my standpoint he can continue with therapy for range of motion and strengthening.  Dental prophylaxis was reinforced.  Follow-up in 3 months with repeat 3 view xrays of right knee.

## 2017-02-15 DIAGNOSIS — M25661 Stiffness of right knee, not elsewhere classified: Secondary | ICD-10-CM | POA: Diagnosis not present

## 2017-02-15 DIAGNOSIS — M25662 Stiffness of left knee, not elsewhere classified: Secondary | ICD-10-CM | POA: Diagnosis not present

## 2017-02-15 DIAGNOSIS — M25561 Pain in right knee: Secondary | ICD-10-CM | POA: Diagnosis not present

## 2017-02-15 DIAGNOSIS — M25562 Pain in left knee: Secondary | ICD-10-CM | POA: Diagnosis not present

## 2017-02-19 ENCOUNTER — Other Ambulatory Visit: Payer: Self-pay

## 2017-02-19 ENCOUNTER — Encounter (HOSPITAL_BASED_OUTPATIENT_CLINIC_OR_DEPARTMENT_OTHER): Payer: Self-pay | Admitting: Emergency Medicine

## 2017-02-19 ENCOUNTER — Emergency Department (HOSPITAL_BASED_OUTPATIENT_CLINIC_OR_DEPARTMENT_OTHER): Payer: Medicare HMO

## 2017-02-19 ENCOUNTER — Emergency Department (HOSPITAL_BASED_OUTPATIENT_CLINIC_OR_DEPARTMENT_OTHER)
Admission: EM | Admit: 2017-02-19 | Discharge: 2017-02-19 | Disposition: A | Discharge status: Home / Self Care | Payer: Medicare HMO | Attending: Emergency Medicine | Admitting: Emergency Medicine

## 2017-02-19 DIAGNOSIS — N453 Epididymo-orchitis: Secondary | ICD-10-CM | POA: Diagnosis not present

## 2017-02-19 DIAGNOSIS — N50812 Left testicular pain: Secondary | ICD-10-CM | POA: Diagnosis not present

## 2017-02-19 DIAGNOSIS — N451 Epididymitis: Secondary | ICD-10-CM | POA: Diagnosis not present

## 2017-02-19 DIAGNOSIS — N189 Chronic kidney disease, unspecified: Secondary | ICD-10-CM | POA: Diagnosis not present

## 2017-02-19 DIAGNOSIS — E1122 Type 2 diabetes mellitus with diabetic chronic kidney disease: Secondary | ICD-10-CM | POA: Insufficient documentation

## 2017-02-19 DIAGNOSIS — N452 Orchitis: Secondary | ICD-10-CM | POA: Diagnosis not present

## 2017-02-19 DIAGNOSIS — I129 Hypertensive chronic kidney disease with stage 1 through stage 4 chronic kidney disease, or unspecified chronic kidney disease: Secondary | ICD-10-CM | POA: Diagnosis not present

## 2017-02-19 DIAGNOSIS — Z96651 Presence of right artificial knee joint: Secondary | ICD-10-CM | POA: Insufficient documentation

## 2017-02-19 LAB — CBC WITH DIFFERENTIAL/PLATELET
Basophils Absolute: 0 10*3/uL (ref 0.0–0.1)
Basophils Relative: 0 %
Eosinophils Absolute: 0 10*3/uL (ref 0.0–0.7)
Eosinophils Relative: 0 %
HCT: 42.7 % (ref 39.0–52.0)
Hemoglobin: 13.9 g/dL (ref 13.0–17.0)
Lymphocytes Relative: 5 %
Lymphs Abs: 0.9 10*3/uL (ref 0.7–4.0)
MCH: 26.6 pg (ref 26.0–34.0)
MCHC: 32.6 g/dL (ref 30.0–36.0)
MCV: 81.8 fL (ref 78.0–100.0)
Monocytes Absolute: 0.8 10*3/uL (ref 0.1–1.0)
Monocytes Relative: 4 %
Neutro Abs: 17.6 10*3/uL — ABNORMAL HIGH (ref 1.7–7.7)
Neutrophils Relative %: 91 %
Platelets: 292 10*3/uL (ref 150–400)
RBC: 5.22 MIL/uL (ref 4.22–5.81)
RDW: 14.7 % (ref 11.5–15.5)
WBC: 19.3 10*3/uL — ABNORMAL HIGH (ref 4.0–10.5)

## 2017-02-19 LAB — URINALYSIS, MICROSCOPIC (REFLEX)

## 2017-02-19 LAB — BASIC METABOLIC PANEL
Anion gap: 11 (ref 5–15)
BUN: 14 mg/dL (ref 6–20)
CO2: 27 mmol/L (ref 22–32)
Calcium: 9.1 mg/dL (ref 8.9–10.3)
Chloride: 101 mmol/L (ref 101–111)
Creatinine, Ser: 1.11 mg/dL (ref 0.61–1.24)
GFR calc Af Amer: >60 mL/min (ref >60)
GFR calc non Af Amer: >60 mL/min (ref >60)
Glucose, Bld: 233 mg/dL — ABNORMAL HIGH (ref 65–99)
Potassium: 3.4 mmol/L — ABNORMAL LOW (ref 3.5–5.1)
Sodium: 139 mmol/L (ref 135–145)

## 2017-02-19 LAB — URINALYSIS, ROUTINE W REFLEX MICROSCOPIC
Bilirubin Urine: NEGATIVE
Glucose, UA: >=500 mg/dL — AB
Ketones, ur: NEGATIVE mg/dL
Nitrite: NEGATIVE
Protein, ur: 30 mg/dL — AB
Specific Gravity, Urine: <1.005 — ABNORMAL LOW (ref 1.005–1.030)
pH: 7 (ref 5.0–8.0)

## 2017-02-19 MED ORDER — CEFTRIAXONE SODIUM 250 MG IJ SOLR
250.0000 mg | Freq: Once | INTRAMUSCULAR | Status: AC
  Administered 2017-02-19: 250 mg via INTRAMUSCULAR
  Filled 2017-02-19: qty 250

## 2017-02-19 MED ORDER — ONDANSETRON HCL 4 MG/2ML IJ SOLN
4.0000 mg | Freq: Once | INTRAMUSCULAR | Status: AC
  Administered 2017-02-19: 4 mg via INTRAVENOUS
  Filled 2017-02-19: qty 2

## 2017-02-19 MED ORDER — HYDROCODONE-ACETAMINOPHEN 5-325 MG PO TABS: 1.0000 | ORAL_TABLET | Freq: Four times a day (QID) | ORAL | 0 refills | Status: DC | PRN

## 2017-02-19 MED ORDER — MORPHINE SULFATE (PF) 4 MG/ML IV SOLN
4.0000 mg | Freq: Once | INTRAVENOUS | Status: AC
  Administered 2017-02-19: 4 mg via INTRAVENOUS
  Filled 2017-02-19: qty 1

## 2017-02-19 MED ORDER — DOXYCYCLINE HYCLATE 100 MG PO TABS
100.0000 mg | ORAL_TABLET | Freq: Once | ORAL | Status: AC
  Administered 2017-02-19: 100 mg via ORAL
  Filled 2017-02-19: qty 1

## 2017-02-19 MED ORDER — HYDROCODONE-ACETAMINOPHEN 5-325 MG PO TABS
1.0000 | ORAL_TABLET | Freq: Once | ORAL | Status: AC
  Administered 2017-02-19: 1 via ORAL
  Filled 2017-02-19: qty 1

## 2017-02-19 MED ORDER — DOXYCYCLINE HYCLATE 100 MG PO CAPS
100.0000 mg | ORAL_CAPSULE | Freq: Two times a day (BID) | ORAL | 0 refills | Status: DC
Start: 2017-02-19 — End: 2017-05-06

## 2017-02-19 MED FILL — HYDROCODON-APAP 5-325: 5-325 | 2 days supply | Qty: 8 | Fill #0

## 2017-02-19 MED FILL — DOXYCYCLINE HYC 100 MG CAP: 100 | 10 days supply | Qty: 20 | Fill #0

## 2017-02-19 NOTE — ED Notes (Signed)
Patient transported to Ultrasound 

## 2017-02-19 NOTE — ED Notes (Signed)
Pt placed on O2 2L d/t sats noted 86-88%; pt A & O x 4, NAD; sts "oxygen gets low" and wears O2 prn at home.

## 2017-02-19 NOTE — ED Provider Notes (Signed)
[facility] DEPARTMENT Provider Note   CSN: [id number] Arrival date & time: 02/19/17  1143     History   Chief Complaint Chief Complaint  Patient presents with  . Testicle Pain    HPI Thomas [name] is a 67 y.o. male.  HPI 67 year old African-American male past medical history significant for CKD, PE currently anticoagulated and on oxygen therapy at home, GERD, dm, htn that presents to the emergency department today for evaluation of left testicular pain and swelling.  Patient states that he was moving a heavy couch yesterday.  Patient states that he felt okay until this morning at 3:00 when he woke up and noticed that the left testicle was swollen and was very painful to palpation and movement.  Patient denies any history of same.  Patient states that he is not take anything for the pain prior to arrival.  Reports that palpation and sitting make the pain worse.  He denies any urinary symptoms including urgency, frequency, hematuria, dysuria.  Patient is followed by urology.  Patient reports normal bowel movements.  Denies any associated nausea or vomiting along with fevers.  Pt denies any fever, chill, ha, vision changes, lightheadedness, dizziness, congestion, neck pain, cp, sob, cough, abd pain, n/v/d, urinary symptoms, change in bowel habits, melena, hematochezia, lower extremity paresthesias.  Past Medical History:  Diagnosis Date  . Arthritis   . Cancer [facility]) 2010   Prostate  . Chronic kidney disease   . Diabetes mellitus   . Diabetic neuropathy (HCC)    feet  . DVT (deep venous thrombosis) ([place])   . Genetic testing 06/22/2016   Mr. Rowser underwent genetic counseling and testing for hereditary cancer syndromes on 05/14/2016. His results were negative for mutations in all 46 genes analyzed by [facility]'s 46-gene Common Hereditary Cancers Panel. Genes analyzed include: APC, ATM, AXIN2, BARD1, BMPR1A, BRCA1, BRCA2, BRIP1, CDH1, CDKN2A, CHEK2, CTNNA1,  DICER1, EPCAM, GREM1, HOXB13, KIT, MEN1, MLH1, MSH2, MSH3, MSH6, MUTYH, NB  . GERD (gastroesophageal reflux disease)   . Headache   . Hypertension   . PE (pulmonary thromboembolism) ([place])   . Pneumonia   . Sleep apnea    not wearing CPAP  . Wears glasses     Patient Active Problem List   Diagnosis Date Noted  . Total knee replacement status 11/19/2016  . Meniscal injury 10/09/2016  . Hypercoagulable state ([place]) 09/22/2016  . Genetic testing 06/22/2016  . DVT, lower extremity, proximal, acute, left ([place]) 01/23/2016  . Hematuria 12/12/2015  . Pulmonary embolism ([place]) 11/23/2015  . Type 2 diabetes mellitus with chronic kidney disease, without long-term current use of insulin ([place]) 12/05/2014  . Benign prostatic hyperplasia with urinary obstruction 07/25/2014  . Urine retention 07/25/2014  . Urinary tract infection 07/09/2014  . Anemia, iron deficiency 11/22/2013  . Urinary retention 11/01/2013  . Incomplete emptying of bladder 07/26/2013  . Nocturia 07/26/2013  . Other nonspecific finding on examination of urine 07/26/2013  . Sleep apnea 07/26/2013  . Urethral stricture 07/26/2013  . Malignant neoplasm of prostate ([place]) 07/04/2013  . Bladder neck contracture 07/04/2013  . Severe obesity (BMI >= 40) ([place]) 05/23/2013  . B12 deficiency 05/23/2013  . Hyperlipidemia with target LDL less than 100 09/02/2012  . HTN (hypertension) 05/26/2012  . Osteoarthritis, knee 05/26/2012  . MORBID OBESITY 08/22/2008    Past Surgical History:  Procedure Laterality Date  . COLONOSCOPY    . HERNIA REPAIR    . KNEE SURGERY    . MULTIPLE TOOTH EXTRACTIONS    .  PROSTATE SURGERY    . RADIOACTIVE SEED IMPLANT    . SHOULDER SURGERY    . TOTAL KNEE ARTHROPLASTY Right 11/19/2016   Procedure: RIGHT TOTAL KNEE ARTHROPLASTY;  Surgeon: Leandrew Koyanagi, MD;  Location: [facility];  Service: Orthopedics;  Laterality: Right;  . uretha surgery-2014         Home Medications    Prior to Admission medications    Medication Sig Start Date End Date Taking? Authorizing Provider  AMBULATORY NON FORMULARY MEDICATION Full Face CPAP Mask with Tubing Dx: G47.30 Patient not taking: Reported on 02/04/2017 11/12/16   Mariea Clonts, Tiffany L, DO  amLODipine (NORVASC) 10 MG tablet Take 1 tablet (10 mg total) by mouth daily. For high blood pressure 02/04/17   [name], NP  aspirin EC 81 MG tablet Take 81 mg by mouth daily.    [provider]  atorvastatin (LIPITOR) 10 MG tablet Take one tablet by mouth once daily for cholesterol 02/04/17   [name], NP  carvedilol (COREG) 6.25 MG tablet Take 1 tablet (6.25 mg total) by mouth 2 (two) times daily with a meal. For high blood pressure 12/14/16   [name], NP  glipiZIDE (GLUCOTROL) 5 MG tablet Take 1 tablet (5 mg total) by mouth daily before breakfast. 02/04/17   [name], NP  hydrALAZINE (APRESOLINE) 25 MG tablet Take 1 tablet (25 mg total) by mouth 3 (three) times daily. 12/14/16   [name], NP  JARDIANCE 10 MG TABS tablet TAKE 1 TABLET BY MOUTH ONCE DAILY Patient not taking: Reported on 02/04/2017 01/21/17   [name], NP  losartan (COZAAR) 100 MG tablet Take 1 tablet (100 mg total) by mouth daily. 12/14/16   [name], NP  meclizine (ANTIVERT) 12.5 MG tablet Take one to two tablets by mouth every 8 hours as needed for vertigo Patient not taking: Reported on 02/04/2017 12/15/16   [name], NP  metFORMIN (GLUCOPHAGE) 500 MG tablet TAKE 1 TABLET BY MOUTH 2 TIMES A DAY WITH A MEAL for diabetes 01/21/16   Estill Dooms, MD  omeprazole (PRILOSEC) 20 MG capsule Take 1 capsule (20 mg total) by mouth as needed. 12/14/16   [name], NP  ondansetron (ZOFRAN) 4 MG tablet Take 1-2 tablets (4-8 mg total) by mouth every 8 (eight) hours as needed for nausea or vomiting. Patient not taking: Reported on 02/04/2017 11/19/16   Leandrew Koyanagi, MD  oxybutynin (DITROPAN) 5 MG tablet Take 5 mg by mouth  daily.    [provider]  potassium chloride SA (K-DUR,KLOR-CON) 20 MEQ tablet Take 1 tablet (20 mEq total) by mouth 2 (two) times daily. 12/14/16   [name], NP  rivaroxaban (XARELTO) 20 MG TABS tablet Take 1 tablet (20 mg total) by mouth daily with supper. 01/23/16   Volanda Napoleon, MD  senna-docusate (SENOKOT S) 8.6-50 MG tablet Take 1 tablet by mouth at bedtime as needed. 11/19/16   Leandrew Koyanagi, MD  traMADol (ULTRAM) 50 MG tablet Take 1 tablet (50 mg total) by mouth every 6 (six) hours as needed. for pain 02/04/17   [name], NP  TRUE METRIX BLOOD GLUCOSE TEST test strip CHECK BLOOD SUGAR TWICE PER DAY 10/24/15   [name], NP  TRUEPLUS LANCETS 30G MISC USE TO TEST BLOOD SUGAR 2 TIMES A DAY 09/12/14   [name], NP    Family History Family History  Problem Relation Age of Onset  .  Breast cancer Mother 80       d.89  . Breast cancer Sister 57       d.30  . Leukemia Brother 18       d.20  . Breast cancer Maternal Aunt 40       d.40s  . Lung cancer Maternal Uncle   . Prostate cancer Paternal Uncle   . Prostate cancer Brother        recurred recently at age 71  . Other Brother 15       spinal tumor  . Cervical cancer Other 22       d.22  . Cancer Sister 75       unspecified type  . Cancer Maternal Uncle 80       unspecified type    Social History Social History   Tobacco Use  . Smoking status: Never Smoker  . Smokeless tobacco: Never Used  Substance Use Topics  . Alcohol use: No    Comment: never  . Drug use: No     Allergies   Lisinopril   Review of Systems Review of Systems  Constitutional: Negative for chills and fever.  HENT: Negative for congestion and sore throat.   Eyes: Negative for visual disturbance.  Respiratory: Negative for cough and shortness of breath.   Cardiovascular: Negative for chest pain.  Gastrointestinal: Negative for abdominal pain, diarrhea, nausea and vomiting.  Genitourinary: Positive  for scrotal swelling and testicular pain. Negative for dysuria, flank pain, frequency, hematuria and urgency.  Musculoskeletal: Negative for arthralgias and myalgias.  Skin: Negative for rash.  Neurological: Negative for dizziness, syncope, weakness, light-headedness, numbness and headaches.  Psychiatric/Behavioral: Negative for sleep disturbance. The patient is not nervous/anxious.      Physical Exam Updated Vital Signs BP (!) 154/95   Pulse 83   Temp 98.3 F (36.8 C) (Oral)   Resp 16   Ht '5\' 9"'$  (1.753 m)   Wt (!) 139.7 kg (308 lb)   SpO2 96%   BMI 45.48 kg/m   Physical Exam  Constitutional: He is oriented to person, place, and time. He appears well-developed and well-nourished.  Non-toxic appearance. No distress.  HENT:  Head: Normocephalic and atraumatic.  Mouth/Throat: Oropharynx is clear and moist.  Eyes: Conjunctivae are normal. Pupils are equal, round, and reactive to light. Right eye exhibits no discharge. Left eye exhibits no discharge.  Neck: Normal range of motion. Neck supple.  Cardiovascular: Normal rate, regular rhythm, normal heart sounds and intact distal pulses. Exam reveals no gallop and no friction rub.  No murmur heard. Pulmonary/Chest: Effort normal.  Abdominal: Soft. Bowel sounds are normal. He exhibits no distension. There is no tenderness. There is no rigidity, no rebound, no guarding, no CVA tenderness and negative Murphy's sign.  Genitourinary:  Genitourinary Comments: Chaperone present for exam. uncirumcisedmale. No penile discharge, erythema, tenderness, lesion, or rash. 2 descended testes.  Left testicle is swollen and is significantly tender to palpation.  No associated erythema, crepitus.  No inguinal lymphadenopathy or hernia.    Musculoskeletal: Normal range of motion. He exhibits no tenderness.  Lymphadenopathy:    He has no cervical adenopathy.  Neurological: He is alert and oriented to person, place, and time.  Skin: Skin is warm and dry.  Capillary refill takes less than 2 seconds. No rash noted.  Psychiatric: His behavior is normal. Judgment and thought content normal.  Nursing note and vitals reviewed.    ED Treatments / Results  Labs (all labs ordered are listed, but  only abnormal results are displayed) Labs Reviewed  BASIC METABOLIC PANEL - Abnormal; Notable for the following components:      Result Value   Potassium 3.4 (*)    Glucose, Bld 233 (*)    All other components within normal limits  CBC WITH DIFFERENTIAL/PLATELET - Abnormal; Notable for the following components:   WBC 19.3 (*)    Neutro Abs 17.6 (*)    All other components within normal limits  URINALYSIS, ROUTINE W REFLEX MICROSCOPIC - Abnormal; Notable for the following components:   APPearance CLOUDY (*)    Specific Gravity, Urine <1.005 (*)    Glucose, UA >=500 (*)    Hgb urine dipstick SMALL (*)    Protein, ur 30 (*)    Leukocytes, UA SMALL (*)    All other components within normal limits  URINALYSIS, MICROSCOPIC (REFLEX) - Abnormal; Notable for the following components:   Bacteria, UA FEW (*)    Squamous Epithelial / LPF 0-5 (*)    All other components within normal limits  URINE CULTURE    EKG  EKG Interpretation  Date/Time:  Friday February 19 2017 12:04:44 EST Ventricular Rate:  79 PR Interval:    QRS Duration: 116 QT Interval:  416 QTC Calculation: 477 R Axis:   43 Text Interpretation:  Sinus rhythm Supraventricular bigeminy Nonspecific intraventricular conduction delay Baseline wander in lead(s) V2 V3 No STEMI.  Confirmed by Nanda Quinton [phone]) on 02/19/2017 2:21:44 PM       Radiology US Scrotum  Result Date: 02/19/2017 CLINICAL DATA:  Left testicular pain after heavy lifting yesterday starting at 3 a.m. History of prostatic in urethral surgery with brachy therapy seed implantation. EXAM: SCROTAL ULTRASOUND DOPPLER ULTRASOUND OF THE TESTICLES TECHNIQUE: Complete ultrasound examination of the testicles, epididymis, and other  scrotal structures was performed. Color and spectral Doppler ultrasound were also utilized to evaluate blood flow to the testicles. COMPARISON:  None. FINDINGS: Right testicle Measurements: 4.3 x 2.2 x 2.4 cm. No mass or microlithiasis visualized. Left testicle Measurements: 4.2 x 2.7 x 3 cm. No mass or microlithiasis visualized. The left testicle is hypervascular relative to the right and slightly heterogeneous without discrete space-occupying mass noted. Right epididymis:  Normal in size and appearance. Left epididymis: Normal in size and appearance. Hypervascular appearance of the epididymis is also noted. Hydrocele: Physiologic amount of fluid seen within the scrotum. No hydroceles. Varicocele:  None visualized. Pulsed Doppler interrogation of both testes demonstrates normal low resistance arterial and venous waveforms bilaterally. IMPRESSION: 1. Hypervascular left epididymis and testicle consistent with epididymal orchitis. 2. No evidence of testicular torsion or discrete mass. Electronically Signed   By: Ashley Royalty M.D.   On: 02/19/2017 14:06   [place] Art/ven Flow Abd Pelv Doppler  Result Date: 02/19/2017 CLINICAL DATA:  Left testicular pain after heavy lifting yesterday starting at 3 a.m. History of prostatic in urethral surgery with brachy therapy seed implantation. EXAM: SCROTAL ULTRASOUND DOPPLER ULTRASOUND OF THE TESTICLES TECHNIQUE: Complete ultrasound examination of the testicles, epididymis, and other scrotal structures was performed. Color and spectral Doppler ultrasound were also utilized to evaluate blood flow to the testicles. COMPARISON:  None. FINDINGS: Right testicle Measurements: 4.3 x 2.2 x 2.4 cm. No mass or microlithiasis visualized. Left testicle Measurements: 4.2 x 2.7 x 3 cm. No mass or microlithiasis visualized. The left testicle is hypervascular relative to the right and slightly heterogeneous without discrete space-occupying mass noted. Right epididymis:  Normal in size and appearance.  Left epididymis: Normal in size and appearance.  Hypervascular appearance of the epididymis is also noted. Hydrocele: Physiologic amount of fluid seen within the scrotum. No hydroceles. Varicocele:  None visualized. Pulsed Doppler interrogation of both testes demonstrates normal low resistance arterial and venous waveforms bilaterally. IMPRESSION: 1. Hypervascular left epididymis and testicle consistent with epididymal orchitis. 2. No evidence of testicular torsion or discrete mass. Electronically Signed   By: Ashley Royalty M.D.   On: 02/19/2017 14:06    Procedures Procedures (including critical care time)  Medications Ordered in ED Medications  cefTRIAXone (ROCEPHIN) injection 250 mg (not administered)  doxycycline (VIBRA-TABS) tablet 100 mg (not administered)  morphine 4 MG/ML injection 4 mg (4 mg Intravenous Given 02/19/17 1312)  ondansetron (ZOFRAN) injection 4 mg (4 mg Intravenous Given 02/19/17 1311)  HYDROcodone-acetaminophen (NORCO/VICODIN) 5-325 MG per tablet 1 tablet (1 tablet Oral Given 02/19/17 1543)     Initial Impression / Assessment and Plan / ED Course  I have reviewed the triage vital signs and the nursing notes.  Pertinent labs & imaging results that were available during my care of the patient were reviewed by me and considered in my medical decision making (see chart for details).     She presents to the emergency department today with complaints of acute onset of left testicular pain and swelling after moving a heavy object yesterday.  Patient denies any associated fever, chills, urinary symptoms, abdominal pain, change in bowel habits.  Overall patient is well-appearing and nontoxic.  Vital signs are reassuring.  On exam patient has no focal abdominal tenderness.  He does have swelling to his left testicle that is exquisitely tender.  Heart regular rate and rhythm.  Patient does have a leukocytosis of 19,000.  Otherwise lab work appears the patient's baseline.  Normal kidney  function.  UA with few bacteria and WBCs with squamous epithelium cells.  Negative nitrite.  This is consistent with patient's prior UAs.  A culture is pending.  Given that patient has no urinary symptoms we will not treat for UTI at this time.  Ultrasound was obtained to rule out any testicular torsion versus epididymitis.  Ultrasound returned that does show signs of epididymitis orchitis.  Since pain was controlled in the ED.  Patient did have a episode where he desatted after the pain medication however patient is on as needed oxygen at home for his blood clot and sleep apnea.  This has since improved in the ED.  Patient remains alert and oriented x3.  Not comfortable with treating patient with Levaquin given his age and comorbidities.  Will treat patient with Rocephin in the ED along with doxycycline.  Patient has follow-up with his urologist in 4 days.  Encouraged him to keep this appointment.  Encourage symptomatic treatment with ice application and scrotal support.  Low suspicion for any other acute intra-abdominal pathology given no focal abdominal tenderness and no signs of peritonitis.  Take patient tolerating p.o. fluids normally.  Pt is hemodynamically stable, in NAD, & able to ambulate in the ED. Evaluation does not show pathology that would require ongoing emergent intervention or inpatient treatment. I explained the diagnosis to the patient. Pain has been managed & has no complaints prior to dc. Pt is comfortable with above plan and is stable for discharge at this time. All questions were answered prior to disposition. Strict return precautions for f/u to the ED were discussed. Encouraged follow up with PCP.  Pt seen and eval by my attending Dr. Laverta Baltimore who is agreeable with the above plan.   Final  Clinical Impressions(s) / ED Diagnoses   Final diagnoses:  Orchitis and epididymitis    ED Discharge Orders    None       Aaron Edelman 02/19/17 1607    Margette Fast,  MD 02/19/17 2004

## 2017-02-19 NOTE — ED Notes (Signed)
Called Lab, urine culture to be done

## 2017-02-19 NOTE — ED Notes (Signed)
Unable to give urine for culture, ED PA informed

## 2017-02-19 NOTE — Discharge Instructions (Addendum)
Please take the antibiotic as prescribed for 10 days.  Keep ice on the area.  Have given you short course of pain medication as well. I would also use a scrotal support such as a jockstrap to help support her scrotum that will relieve pain.  You may also take ibuprofen or Motrin along with the pain medicine.  Will need follow-up with urology next week.  Return to the ED if you develop any fevers, vomiting, worsening pain, difficulty urinating, abdominal pain or for any other reason.

## 2017-02-19 NOTE — ED Triage Notes (Signed)
Reports lifting something yesterday and woke up at 0300 this morning with left testicle pain

## 2017-02-22 ENCOUNTER — Telehealth (INDEPENDENT_AMBULATORY_CARE_PROVIDER_SITE_OTHER): Payer: Self-pay | Admitting: Orthopaedic Surgery

## 2017-02-22 LAB — URINE CULTURE: Culture: >=100000 — AB

## 2017-02-22 NOTE — Telephone Encounter (Signed)
Patient called saying that he will be getting a procedure for his hernia and Dr. [name] asked for him to call so he could be prescribed antibiotics before hand. CB #  [phone]

## 2017-02-22 NOTE — Telephone Encounter (Signed)
Called patient to advise  °

## 2017-02-22 NOTE — Telephone Encounter (Signed)
They should give him abx before the hernia surgery.

## 2017-02-22 NOTE — Telephone Encounter (Signed)
See message below °

## 2017-02-23 ENCOUNTER — Telehealth: Payer: Self-pay | Admitting: Emergency Medicine

## 2017-02-23 DIAGNOSIS — E1165 Type 2 diabetes mellitus with hyperglycemia: Secondary | ICD-10-CM | POA: Diagnosis not present

## 2017-02-23 DIAGNOSIS — Z8546 Personal history of malignant neoplasm of prostate: Secondary | ICD-10-CM | POA: Diagnosis not present

## 2017-02-23 DIAGNOSIS — N320 Bladder-neck obstruction: Secondary | ICD-10-CM | POA: Diagnosis not present

## 2017-02-23 DIAGNOSIS — N3941 Urge incontinence: Secondary | ICD-10-CM | POA: Diagnosis not present

## 2017-02-23 DIAGNOSIS — Z6841 Body Mass Index (BMI) 40.0 and over, adult: Secondary | ICD-10-CM | POA: Diagnosis not present

## 2017-02-23 DIAGNOSIS — R3915 Urgency of urination: Secondary | ICD-10-CM | POA: Diagnosis not present

## 2017-02-23 DIAGNOSIS — R350 Frequency of micturition: Secondary | ICD-10-CM | POA: Diagnosis not present

## 2017-02-23 DIAGNOSIS — N50812 Left testicular pain: Secondary | ICD-10-CM | POA: Diagnosis not present

## 2017-02-23 DIAGNOSIS — N3944 Nocturnal enuresis: Secondary | ICD-10-CM | POA: Diagnosis not present

## 2017-02-23 DIAGNOSIS — Z8042 Family history of malignant neoplasm of prostate: Secondary | ICD-10-CM | POA: Diagnosis not present

## 2017-02-23 MED FILL — CEPHALEXIN 500 MG CAPSULE: 500 | 10 days supply | Qty: 30 | Fill #0

## 2017-02-23 NOTE — Telephone Encounter (Signed)
Post ED Visit - Positive Culture Follow-up  Culture report reviewed by antimicrobial stewardship pharmacist:  []  Elenor Quinones, Pharm.D. []  Heide Guile, Pharm.D., BCPS AQ-ID []  Parks Neptune, Pharm.D., BCPS []  Alycia Rossetti, Pharm.D., BCPS []  [place], Pharm.D., BCPS, AAHIVP []  Legrand Como, Pharm.D., BCPS, AAHIVP []  Salome Arnt, PharmD, BCPS []  Jalene Mullet, PharmD []  Vincenza Hews, PharmD, BCPS  Positive urine culture Treated with doxycycline, organism sensitive to the same and no further patient follow-up is required at this time.  Hazle Nordmann 02/23/2017, 1:49 PM

## 2017-02-23 NOTE — Progress Notes (Signed)
ED Antimicrobial Stewardship Positive Culture Follow Up   Thomas [name] is an 67 y.o. male who presented to [facility] on 02/19/2017 with a chief complaint of  Chief Complaint  Patient presents with  . Testicle Pain    Recent Results (from the past 720 hour(s))  Urine culture     Status: Abnormal   Collection Time: 02/19/17 12:35 PM  Result Value Ref Range Status   Specimen Description URINE, CLEAN CATCH  Final   Special Requests NONE  Final   Culture >=100,000 COLONIES/mL SERRATIA MARCESCENS (A)  Final   Report Status 02/22/2017 FINAL  Final   Organism ID, Bacteria SERRATIA MARCESCENS (A)  Final      Susceptibility   Serratia marcescens - MIC*    CEFAZOLIN >=64 RESISTANT Resistant     CEFTRIAXONE <=1 SENSITIVE Sensitive     CIPROFLOXACIN <=0.25 SENSITIVE Sensitive     GENTAMICIN <=1 SENSITIVE Sensitive     NITROFURANTOIN 128 RESISTANT Resistant     TRIMETH/SULFA <=20 SENSITIVE Sensitive     * >=100,000 COLONIES/mL SERRATIA MARCESCENS      New antibiotic prescription: Ok to continue doxycycline, no further treatment needed.  ED Provider: Alferd Apa, PA-C   Jalene Mullet, Pharm.D. PGY1 Pharmacy Resident 02/23/2017 9:20 AM Main Pharmacy: [id number] Phone# [phone]

## 2017-03-01 ENCOUNTER — Telehealth: Payer: Self-pay

## 2017-03-01 DIAGNOSIS — R42 Dizziness and giddiness: Secondary | ICD-10-CM

## 2017-03-01 NOTE — Telephone Encounter (Signed)
Patient called requesting referral to ENT for ongoing vertigo

## 2017-03-01 NOTE — Telephone Encounter (Signed)
done

## 2017-03-12 ENCOUNTER — Telehealth: Payer: Self-pay | Admitting: *Deleted

## 2017-03-12 DIAGNOSIS — G4730 Sleep apnea, unspecified: Secondary | ICD-10-CM | POA: Diagnosis not present

## 2017-03-12 DIAGNOSIS — S838X9A Sprain of other specified parts of unspecified knee, initial encounter: Secondary | ICD-10-CM | POA: Diagnosis not present

## 2017-03-12 DIAGNOSIS — G4733 Obstructive sleep apnea (adult) (pediatric): Secondary | ICD-10-CM | POA: Diagnosis not present

## 2017-03-12 DIAGNOSIS — M1710 Unilateral primary osteoarthritis, unspecified knee: Secondary | ICD-10-CM | POA: Diagnosis not present

## 2017-03-12 NOTE — Telephone Encounter (Signed)
Patient called and left message on Clinical Intake and stated that he could not come in today but is wondering if something could be called into his pharmacy for Cold Sx. Stated that he has chest congestion and coughing up Yellow congestion.   Called patient back and LMOM to return call. Need to get alittle more information. Awaiting call.

## 2017-03-13 ENCOUNTER — Emergency Department (HOSPITAL_BASED_OUTPATIENT_CLINIC_OR_DEPARTMENT_OTHER): Payer: Medicare HMO

## 2017-03-13 ENCOUNTER — Encounter (HOSPITAL_BASED_OUTPATIENT_CLINIC_OR_DEPARTMENT_OTHER): Payer: Self-pay | Admitting: Emergency Medicine

## 2017-03-13 ENCOUNTER — Other Ambulatory Visit: Payer: Self-pay

## 2017-03-13 ENCOUNTER — Emergency Department (HOSPITAL_BASED_OUTPATIENT_CLINIC_OR_DEPARTMENT_OTHER)
Admission: EM | Admit: 2017-03-13 | Discharge: 2017-03-13 | Disposition: A | Discharge status: Home / Self Care | Payer: Medicare HMO | Attending: Emergency Medicine | Admitting: Emergency Medicine

## 2017-03-13 DIAGNOSIS — Z8546 Personal history of malignant neoplasm of prostate: Secondary | ICD-10-CM | POA: Diagnosis not present

## 2017-03-13 DIAGNOSIS — J209 Acute bronchitis, unspecified: Secondary | ICD-10-CM | POA: Diagnosis not present

## 2017-03-13 DIAGNOSIS — Z7901 Long term (current) use of anticoagulants: Secondary | ICD-10-CM | POA: Insufficient documentation

## 2017-03-13 DIAGNOSIS — Z7984 Long term (current) use of oral hypoglycemic drugs: Secondary | ICD-10-CM | POA: Insufficient documentation

## 2017-03-13 DIAGNOSIS — Z79899 Other long term (current) drug therapy: Secondary | ICD-10-CM | POA: Insufficient documentation

## 2017-03-13 DIAGNOSIS — E1122 Type 2 diabetes mellitus with diabetic chronic kidney disease: Secondary | ICD-10-CM | POA: Diagnosis not present

## 2017-03-13 DIAGNOSIS — Z7982 Long term (current) use of aspirin: Secondary | ICD-10-CM | POA: Diagnosis not present

## 2017-03-13 DIAGNOSIS — N189 Chronic kidney disease, unspecified: Secondary | ICD-10-CM | POA: Diagnosis not present

## 2017-03-13 DIAGNOSIS — I129 Hypertensive chronic kidney disease with stage 1 through stage 4 chronic kidney disease, or unspecified chronic kidney disease: Secondary | ICD-10-CM | POA: Diagnosis not present

## 2017-03-13 DIAGNOSIS — Z96651 Presence of right artificial knee joint: Secondary | ICD-10-CM | POA: Diagnosis not present

## 2017-03-13 DIAGNOSIS — R05 Cough: Secondary | ICD-10-CM | POA: Diagnosis not present

## 2017-03-13 DIAGNOSIS — R079 Chest pain, unspecified: Secondary | ICD-10-CM | POA: Diagnosis not present

## 2017-03-13 LAB — BASIC METABOLIC PANEL
Anion gap: 9 (ref 5–15)
BUN: 21 mg/dL — ABNORMAL HIGH (ref 6–20)
CO2: 26 mmol/L (ref 22–32)
Calcium: 8.4 mg/dL — ABNORMAL LOW (ref 8.9–10.3)
Chloride: 103 mmol/L (ref 101–111)
Creatinine, Ser: 1.3 mg/dL — ABNORMAL HIGH (ref 0.61–1.24)
GFR calc Af Amer: >60 mL/min (ref >60)
GFR calc non Af Amer: 56 mL/min — ABNORMAL LOW (ref >60)
Glucose, Bld: 270 mg/dL — ABNORMAL HIGH (ref 65–99)
Potassium: 3.4 mmol/L — ABNORMAL LOW (ref 3.5–5.1)
Sodium: 138 mmol/L (ref 135–145)

## 2017-03-13 LAB — CBC WITH DIFFERENTIAL/PLATELET
Basophils Absolute: 0 10*3/uL (ref 0.0–0.1)
Basophils Relative: 0 %
Eosinophils Absolute: 0 10*3/uL (ref 0.0–0.7)
Eosinophils Relative: 0 %
HCT: 37.3 % — ABNORMAL LOW (ref 39.0–52.0)
Hemoglobin: 11.9 g/dL — ABNORMAL LOW (ref 13.0–17.0)
Lymphocytes Relative: 17 %
Lymphs Abs: 1.5 10*3/uL (ref 0.7–4.0)
MCH: 26.4 pg (ref 26.0–34.0)
MCHC: 31.9 g/dL (ref 30.0–36.0)
MCV: 82.7 fL (ref 78.0–100.0)
Monocytes Absolute: 0.6 10*3/uL (ref 0.1–1.0)
Monocytes Relative: 7 %
Neutro Abs: 6.6 10*3/uL (ref 1.7–7.7)
Neutrophils Relative %: 76 %
Platelets: 310 10*3/uL (ref 150–400)
RBC: 4.51 MIL/uL (ref 4.22–5.81)
RDW: 15.2 % (ref 11.5–15.5)
WBC: 8.7 10*3/uL (ref 4.0–10.5)

## 2017-03-13 LAB — CBG MONITORING, ED: Glucose-Capillary: 244 mg/dL — ABNORMAL HIGH (ref 65–99)

## 2017-03-13 LAB — TROPONIN I: Troponin I: <0.03 ng/mL (ref <0.03)

## 2017-03-13 MED ORDER — AZITHROMYCIN 250 MG PO TABS: 250.0000 mg | ORAL_TABLET | Freq: Every day | ORAL | 0 refills | Status: DC

## 2017-03-13 MED ORDER — DOXYCYCLINE HYCLATE 100 MG PO TABS: 100.0000 mg | ORAL_TABLET | Freq: Once | ORAL | Status: DC

## 2017-03-13 NOTE — ED Notes (Signed)
ED Provider at bedside. 

## 2017-03-13 NOTE — ED Notes (Signed)
Pt on cardiac monitor and auto VS 

## 2017-03-13 NOTE — ED Triage Notes (Signed)
Cough and congestion x 2 weeks

## 2017-03-13 NOTE — Discharge Instructions (Addendum)
Your evaluated in the emergency department for a cough and associated illness.  Your lab work shows that you might be a little dehydrated but your chest x-ray was negative for pneumonia.  We are prescribing you Zithromax to take for 5 days.  You should also try to keep drinking a lot of fluids.  You should follow-up with your doctor for reevaluation of your symptoms and return to the emergency department if any acute worsening.

## 2017-03-13 NOTE — ED Notes (Signed)
RN aware of CBG reading. 

## 2017-03-13 NOTE — ED Provider Notes (Signed)
[facility] EMERGENCY DEPARTMENT Provider Note   CSN: [id number] Arrival date & time: 03/13/17  1127     History   Chief Complaint Chief Complaint  Patient presents with  . Cough    HPI Thomas [name] is a 67 y.o. male.  The history is provided by the patient.  Cough  This is a new problem. The current episode started more than 1 week ago. The problem occurs every few minutes. The cough is non-productive. There has been no fever. Associated symptoms include chest pain, sweats and rhinorrhea. Pertinent negatives include no chills, no ear congestion, no ear pain, no headaches, no sore throat, no myalgias, no shortness of breath and no wheezing. He has tried nothing for the symptoms. He is not a smoker. His past medical history does not include pneumonia, COPD, emphysema or asthma.    67 year old male with remote history of cancer history of DVT PE diabetes on anticoagulation-patient presenting with 2 weeks of cold symptoms nasal congestion and over the last few days increased cough with minimal phlegm production.  The cough is associated with some chest tightness.  There is intermittent sharp chest pain.  He denies feeling short of breath.  There is been no fever but he does feel hot and cold at times.  No shaking chills.  No nausea vomiting diarrhea.  The patient self catheterizes for urine due to prostate issues but is noticed no cloudy urine.  Past Medical History:  Diagnosis Date  . Arthritis   . Cancer [facility]) 2010   Prostate  . Chronic kidney disease   . Diabetes mellitus   . Diabetic neuropathy (HCC)    feet  . DVT (deep venous thrombosis) ([place])   . Genetic testing 06/22/2016   Mr. Chavarria underwent genetic counseling and testing for hereditary cancer syndromes on 05/14/2016. His results were negative for mutations in all 46 genes analyzed by [facility]'s 46-gene Common Hereditary Cancers Panel. Genes analyzed include: APC, ATM, AXIN2, BARD1, BMPR1A, BRCA1, BRCA2,  BRIP1, CDH1, CDKN2A, CHEK2, CTNNA1, DICER1, EPCAM, GREM1, HOXB13, KIT, MEN1, MLH1, MSH2, MSH3, MSH6, MUTYH, NB  . GERD (gastroesophageal reflux disease)   . Headache   . Hypertension   . PE (pulmonary thromboembolism) ([place])   . Pneumonia   . Sleep apnea    not wearing CPAP  . Wears glasses     Patient Active Problem List   Diagnosis Date Noted  . Total knee replacement status 11/19/2016  . Meniscal injury 10/09/2016  . Hypercoagulable state ([place]) 09/22/2016  . Genetic testing 06/22/2016  . DVT, lower extremity, proximal, acute, left ([place]) 01/23/2016  . Hematuria 12/12/2015  . Pulmonary embolism ([place]) 11/23/2015  . Type 2 diabetes mellitus with chronic kidney disease, without long-term current use of insulin ([place]) 12/05/2014  . Benign prostatic hyperplasia with urinary obstruction 07/25/2014  . Urine retention 07/25/2014  . Urinary tract infection 07/09/2014  . Anemia, iron deficiency 11/22/2013  . Urinary retention 11/01/2013  . Incomplete emptying of bladder 07/26/2013  . Nocturia 07/26/2013  . Other nonspecific finding on examination of urine 07/26/2013  . Sleep apnea 07/26/2013  . Urethral stricture 07/26/2013  . Malignant neoplasm of prostate ([place]) 07/04/2013  . Bladder neck contracture 07/04/2013  . Severe obesity (BMI >= 40) ([place]) 05/23/2013  . B12 deficiency 05/23/2013  . Hyperlipidemia with target LDL less than 100 09/02/2012  . HTN (hypertension) 05/26/2012  . Osteoarthritis, knee 05/26/2012  . MORBID OBESITY 08/22/2008    Past Surgical History:  Procedure Laterality Date  .  COLONOSCOPY    . HERNIA REPAIR    . KNEE SURGERY    . MULTIPLE TOOTH EXTRACTIONS    . PROSTATE SURGERY    . RADIOACTIVE SEED IMPLANT    . SHOULDER SURGERY    . TOTAL KNEE ARTHROPLASTY Right 11/19/2016   Procedure: RIGHT TOTAL KNEE ARTHROPLASTY;  Surgeon: Leandrew Koyanagi, MD;  Location: [facility];  Service: Orthopedics;  Laterality: Right;  . uretha surgery-2014         Home Medications     Prior to Admission medications   Medication Sig Start Date End Date Taking? Authorizing Provider  AMBULATORY NON FORMULARY MEDICATION Full Face CPAP Mask with Tubing Dx: G47.30 Patient not taking: Reported on 02/04/2017 11/12/16   Mariea Clonts, Tiffany L, DO  amLODipine (NORVASC) 10 MG tablet Take 1 tablet (10 mg total) by mouth daily. For high blood pressure 02/04/17   [name], NP  aspirin EC 81 MG tablet Take 81 mg by mouth daily.    [provider]  atorvastatin (LIPITOR) 10 MG tablet Take one tablet by mouth once daily for cholesterol 02/04/17   [name], NP  carvedilol (COREG) 6.25 MG tablet Take 1 tablet (6.25 mg total) by mouth 2 (two) times daily with a meal. For high blood pressure 12/14/16   [name], NP  doxycycline (VIBRAMYCIN) 100 MG capsule Take 1 capsule (100 mg total) by mouth 2 (two) times daily. 02/19/17   Doristine Devoid, PA-C  glipiZIDE (GLUCOTROL) 5 MG tablet Take 1 tablet (5 mg total) by mouth daily before breakfast. 02/04/17   [name], NP  hydrALAZINE (APRESOLINE) 25 MG tablet Take 1 tablet (25 mg total) by mouth 3 (three) times daily. 12/14/16   [name], NP  HYDROcodone-acetaminophen (NORCO) 5-325 MG tablet Take 1 tablet by mouth every 6 (six) hours as needed. 02/19/17   Doristine Devoid, PA-C  JARDIANCE 10 MG TABS tablet TAKE 1 TABLET BY MOUTH ONCE DAILY Patient not taking: Reported on 02/04/2017 01/21/17   [name], NP  losartan (COZAAR) 100 MG tablet Take 1 tablet (100 mg total) by mouth daily. 12/14/16   [name], NP  meclizine (ANTIVERT) 12.5 MG tablet Take one to two tablets by mouth every 8 hours as needed for vertigo Patient not taking: Reported on 02/04/2017 12/15/16   [name], NP  metFORMIN (GLUCOPHAGE) 500 MG tablet TAKE 1 TABLET BY MOUTH 2 TIMES A DAY WITH A MEAL for diabetes 01/21/16   Estill Dooms, MD  omeprazole (PRILOSEC) 20 MG capsule Take 1 capsule (20 mg total) by  mouth as needed. 12/14/16   [name], NP  ondansetron (ZOFRAN) 4 MG tablet Take 1-2 tablets (4-8 mg total) by mouth every 8 (eight) hours as needed for nausea or vomiting. Patient not taking: Reported on 02/04/2017 11/19/16   Leandrew Koyanagi, MD  oxybutynin (DITROPAN) 5 MG tablet Take 5 mg by mouth daily.    [provider]  potassium chloride SA (K-DUR,KLOR-CON) 20 MEQ tablet Take 1 tablet (20 mEq total) by mouth 2 (two) times daily. 12/14/16   [name], NP  rivaroxaban (XARELTO) 20 MG TABS tablet Take 1 tablet (20 mg total) by mouth daily with supper. 01/23/16   Volanda Napoleon, MD  senna-docusate (SENOKOT S) 8.6-50 MG tablet Take 1 tablet by mouth at bedtime as needed. 11/19/16   Leandrew Koyanagi, MD  traMADol (ULTRAM) 50 MG tablet Take 1 tablet (50 mg total)  by mouth every 6 (six) hours as needed. for pain 02/04/17   [name], NP  TRUE METRIX BLOOD GLUCOSE TEST test strip CHECK BLOOD SUGAR TWICE PER DAY 10/24/15   [name], NP  TRUEPLUS LANCETS 30G MISC USE TO TEST BLOOD SUGAR 2 TIMES A DAY 09/12/14   [name], NP    Family History Family History  Problem Relation Age of Onset  . Breast cancer Mother 83       d.89  . Breast cancer Sister 59       d.30  . Leukemia Brother 18       d.20  . Breast cancer Maternal Aunt 40       d.40s  . Lung cancer Maternal Uncle   . Prostate cancer Paternal Uncle   . Prostate cancer Brother        recurred recently at age 48  . Other Brother 15       spinal tumor  . Cervical cancer Other 22       d.22  . Cancer Sister 44       unspecified type  . Cancer Maternal Uncle 80       unspecified type    Social History Social History   Tobacco Use  . Smoking status: Never Smoker  . Smokeless tobacco: Never Used  Substance Use Topics  . Alcohol use: No    Comment: never  . Drug use: No     Allergies   Lisinopril   Review of Systems Review of Systems  Constitutional: Negative for chills  and fever.  HENT: Positive for rhinorrhea. Negative for ear pain and sore throat.   Eyes: Negative for pain and visual disturbance.  Respiratory: Positive for cough. Negative for shortness of breath and wheezing.   Cardiovascular: Positive for chest pain. Negative for palpitations.  Gastrointestinal: Negative for abdominal pain and vomiting.  Genitourinary: Negative for dysuria and hematuria.  Musculoskeletal: Negative for arthralgias, back pain and myalgias.  Skin: Negative for color change and rash.  Neurological: Negative for seizures, syncope and headaches.  All other systems reviewed and are negative.    Physical Exam Updated Vital Signs BP (!) 167/73   Pulse 75   Temp 99.1 F (37.3 C) (Oral)   Resp 18   Ht '5\' 9"'$  (1.753 m)   Wt (!) 140.6 kg (310 lb)   SpO2 95%   BMI 45.78 kg/m   Physical Exam  Constitutional: He appears well-developed and well-nourished.  HENT:  Head: Normocephalic and atraumatic.  Right Ear: External ear normal.  Left Ear: External ear normal.  Nose: Nose normal.  Mouth/Throat: Oropharynx is clear and moist.  Eyes: Conjunctivae are normal.  Neck: Normal range of motion. Neck supple.  Cardiovascular: Normal rate and regular rhythm.  No murmur heard. Pulmonary/Chest: Effort normal and breath sounds normal. No respiratory distress.  Abdominal: Soft. There is no tenderness.  Musculoskeletal: Normal range of motion. He exhibits no edema or tenderness.  Neurological: He is alert. GCS eye subscore is 4. GCS verbal subscore is 5. GCS motor subscore is 6.  Skin: Skin is warm and dry.  Psychiatric: He has a normal mood and affect.  Nursing note and vitals reviewed.    ED Treatments / Results  Labs (all labs ordered are listed, but only abnormal results are displayed) Labs Reviewed  BASIC METABOLIC PANEL - Abnormal; Notable for the following components:      Result Value   Potassium 3.4 (*)  Glucose, Bld 270 (*)    BUN 21 (*)    Creatinine, Ser  1.30 (*)    Calcium 8.4 (*)    GFR calc non Af Amer 56 (*)    All other components within normal limits  CBC WITH DIFFERENTIAL/PLATELET - Abnormal; Notable for the following components:   Hemoglobin 11.9 (*)    HCT 37.3 (*)    All other components within normal limits  CBG MONITORING, ED - Abnormal; Notable for the following components:   Glucose-Capillary 244 (*)    All other components within normal limits  TROPONIN I    EKG  EKG Interpretation  Date/Time:  Saturday March 13 2017 12:55:25 EST Ventricular Rate:  66 PR Interval:    QRS Duration: 92 QT Interval:  427 QTC Calculation: 448 R Axis:   38 Text Interpretation:  Sinus rhythm no acute st/ts Confirmed by Aletta Edouard [phone]) on 03/13/2017 1:13:47 PM       Radiology Dg Chest 2 View  Result Date: 03/13/2017 CLINICAL DATA:  Coughing congestion. EXAM: CHEST  2 VIEW COMPARISON:  06/13/2016 FINDINGS: The lungs are clear without focal pneumonia, edema, pneumothorax or pleural effusion. The cardiopericardial silhouette is within normal limits for size. The visualized bony structures of the thorax are intact. IMPRESSION: No active cardiopulmonary disease. Electronically Signed   By: Misty Stanley M.D.   On: 03/13/2017 12:07    Procedures Procedures (including critical care time)  Medications Ordered in ED Medications - No data to display   Initial Impression / Assessment and Plan / ED Course  I have reviewed the triage vital signs and the nursing notes.  Pertinent labs & imaging results that were available during my care of the patient were reviewed by me and considered in my medical decision making (see chart for details).  Clinical Course as of Mar 14 1530  Sat Mar 13, 2017  1423 Patient feels comfortable starting on some oral antibiotics for treatment although this is possibly viral.  His labs are also suggestive of some dehydration.  He understands the need to follow-up with his primary care doctor.  [MB]      Clinical Course User Index [MB] Hayden Rasmussen, MD      Final Clinical Impressions(s) / ED Diagnoses   Final diagnoses:  Acute bronchitis, unspecified organism    ED Discharge Orders        Ordered    azithromycin (ZITHROMAX) 250 MG tablet  Daily     03/13/17 1429       Hayden Rasmussen, MD 03/14/17 [phone]

## 2017-03-23 MED FILL — LOSARTAN POTASSIUM 100 MG T: 100 | 90 days supply | Qty: 90 | Fill #1

## 2017-03-23 MED FILL — OMEPRAZOLE 20 MG CAP: 20 | 30 days supply | Qty: 30 | Fill #1

## 2017-04-01 DIAGNOSIS — H8111 Benign paroxysmal vertigo, right ear: Secondary | ICD-10-CM | POA: Diagnosis not present

## 2017-04-01 DIAGNOSIS — H833X3 Noise effects on inner ear, bilateral: Secondary | ICD-10-CM | POA: Diagnosis not present

## 2017-04-01 DIAGNOSIS — H9113 Presbycusis, bilateral: Secondary | ICD-10-CM | POA: Diagnosis not present

## 2017-04-02 ENCOUNTER — Emergency Department (HOSPITAL_BASED_OUTPATIENT_CLINIC_OR_DEPARTMENT_OTHER): Payer: Medicare HMO

## 2017-04-02 ENCOUNTER — Encounter (HOSPITAL_BASED_OUTPATIENT_CLINIC_OR_DEPARTMENT_OTHER): Payer: Self-pay | Admitting: Emergency Medicine

## 2017-04-02 ENCOUNTER — Other Ambulatory Visit: Payer: Self-pay

## 2017-04-02 ENCOUNTER — Emergency Department (HOSPITAL_BASED_OUTPATIENT_CLINIC_OR_DEPARTMENT_OTHER)
Admission: EM | Admit: 2017-04-02 | Discharge: 2017-04-02 | Disposition: A | Discharge status: Home / Self Care | Payer: Medicare HMO | Attending: Emergency Medicine | Admitting: Emergency Medicine

## 2017-04-02 DIAGNOSIS — Z8546 Personal history of malignant neoplasm of prostate: Secondary | ICD-10-CM | POA: Insufficient documentation

## 2017-04-02 DIAGNOSIS — Z86718 Personal history of other venous thrombosis and embolism: Secondary | ICD-10-CM | POA: Insufficient documentation

## 2017-04-02 DIAGNOSIS — R0602 Shortness of breath: Secondary | ICD-10-CM | POA: Diagnosis not present

## 2017-04-02 DIAGNOSIS — Z7982 Long term (current) use of aspirin: Secondary | ICD-10-CM | POA: Insufficient documentation

## 2017-04-02 DIAGNOSIS — Z96651 Presence of right artificial knee joint: Secondary | ICD-10-CM | POA: Diagnosis not present

## 2017-04-02 DIAGNOSIS — I10 Essential (primary) hypertension: Secondary | ICD-10-CM | POA: Diagnosis not present

## 2017-04-02 DIAGNOSIS — Z7984 Long term (current) use of oral hypoglycemic drugs: Secondary | ICD-10-CM | POA: Insufficient documentation

## 2017-04-02 DIAGNOSIS — Z7901 Long term (current) use of anticoagulants: Secondary | ICD-10-CM | POA: Diagnosis not present

## 2017-04-02 DIAGNOSIS — R0789 Other chest pain: Secondary | ICD-10-CM | POA: Diagnosis not present

## 2017-04-02 DIAGNOSIS — E119 Type 2 diabetes mellitus without complications: Secondary | ICD-10-CM | POA: Diagnosis not present

## 2017-04-02 DIAGNOSIS — E1140 Type 2 diabetes mellitus with diabetic neuropathy, unspecified: Secondary | ICD-10-CM | POA: Insufficient documentation

## 2017-04-02 DIAGNOSIS — Z79899 Other long term (current) drug therapy: Secondary | ICD-10-CM | POA: Diagnosis not present

## 2017-04-02 DIAGNOSIS — R079 Chest pain, unspecified: Secondary | ICD-10-CM | POA: Diagnosis not present

## 2017-04-02 LAB — BASIC METABOLIC PANEL
Anion gap: 11 (ref 5–15)
BUN: 20 mg/dL (ref 6–20)
CO2: 26 mmol/L (ref 22–32)
Calcium: 8.3 mg/dL — ABNORMAL LOW (ref 8.9–10.3)
Chloride: 103 mmol/L (ref 101–111)
Creatinine, Ser: 1.13 mg/dL (ref 0.61–1.24)
GFR calc Af Amer: >60 mL/min (ref >60)
GFR calc non Af Amer: >60 mL/min (ref >60)
Glucose, Bld: 246 mg/dL — ABNORMAL HIGH (ref 65–99)
Potassium: 3.4 mmol/L — ABNORMAL LOW (ref 3.5–5.1)
Sodium: 140 mmol/L (ref 135–145)

## 2017-04-02 LAB — CBC
HCT: 38.2 % — ABNORMAL LOW (ref 39.0–52.0)
Hemoglobin: 12.4 g/dL — ABNORMAL LOW (ref 13.0–17.0)
MCH: 26.7 pg (ref 26.0–34.0)
MCHC: 32.5 g/dL (ref 30.0–36.0)
MCV: 82.2 fL (ref 78.0–100.0)
Platelets: 278 10*3/uL (ref 150–400)
RBC: 4.65 MIL/uL (ref 4.22–5.81)
RDW: 15 % (ref 11.5–15.5)
WBC: 7.3 10*3/uL (ref 4.0–10.5)

## 2017-04-02 LAB — TROPONIN I: Troponin I: <0.03 ng/mL (ref <0.03)

## 2017-04-02 MED ORDER — IOPAMIDOL (ISOVUE-370) INJECTION 76%
125.0000 mL | Freq: Once | INTRAVENOUS | Status: AC | PRN
  Administered 2017-04-02: 100 mL via INTRAVENOUS

## 2017-04-02 NOTE — Discharge Instructions (Signed)
The cause of your pain was not identified today. You had a CT scan performed that demonstrated a gallstone in your gallbladder as well as some hardening of the arteries around her heart.  Please follow-up with your family doctor and cardiology.  Get rechecked immediately if you develop any new or concerning symptoms.

## 2017-04-02 NOTE — ED Provider Notes (Signed)
[facility] EMERGENCY DEPARTMENT Provider Note   CSN: [id number] Arrival date & time: 04/02/17  3491     History   Chief Complaint Chief Complaint  Patient presents with  . Chest Pain    HPI Thomas [name] is a 67 y.o. male.  The history is provided by the patient. No language interpreter was used.  Chest Pain      KARY COLAIZZI is a 67 y.o. male who presents to the Emergency Department complaining of chest pain.  He reports sharp, left sided chest pain that has been present for the last two days.  Pain is waxing and waning in nature with no clear alleviating or worsening factors.  Episodes last for several seconds at time and then resolve. Episodes occur frequently.  No change with activity, breathing, eating.  Denies fever, diaphoresis, cough, sob, vomiting, leg swelling or pain.  He does have vertigo (ongoing issue) with associated nausea.  He also reports midline low back that has been present for the last several months.  He does have a hx/o DVT and is worried he may have a clot in his lung (had a PE a year ago).  He is currently taking Xarelto, compliant with dosing.    Past Medical History:  Diagnosis Date  . Arthritis   . Cancer [facility]) 2010   Prostate  . Chronic kidney disease   . Diabetes mellitus   . Diabetic neuropathy (HCC)    feet  . DVT (deep venous thrombosis) ([place])   . Genetic testing 06/22/2016   Mr. River underwent genetic counseling and testing for hereditary cancer syndromes on 05/14/2016. His results were negative for mutations in all 46 genes analyzed by [facility]'s 46-gene Common Hereditary Cancers Panel. Genes analyzed include: APC, ATM, AXIN2, BARD1, BMPR1A, BRCA1, BRCA2, BRIP1, CDH1, CDKN2A, CHEK2, CTNNA1, DICER1, EPCAM, GREM1, HOXB13, KIT, MEN1, MLH1, MSH2, MSH3, MSH6, MUTYH, NB  . GERD (gastroesophageal reflux disease)   . Headache   . Hypertension   . PE (pulmonary thromboembolism) ([place])   . Pneumonia   . Sleep apnea    not  wearing CPAP  . Wears glasses     Patient Active Problem List   Diagnosis Date Noted  . Total knee replacement status 11/19/2016  . Meniscal injury 10/09/2016  . Hypercoagulable state ([place]) 09/22/2016  . Genetic testing 06/22/2016  . DVT, lower extremity, proximal, acute, left ([place]) 01/23/2016  . Hematuria 12/12/2015  . Pulmonary embolism ([place]) 11/23/2015  . Type 2 diabetes mellitus with chronic kidney disease, without long-term current use of insulin ([place]) 12/05/2014  . Benign prostatic hyperplasia with urinary obstruction 07/25/2014  . Urine retention 07/25/2014  . Urinary tract infection 07/09/2014  . Anemia, iron deficiency 11/22/2013  . Urinary retention 11/01/2013  . Incomplete emptying of bladder 07/26/2013  . Nocturia 07/26/2013  . Other nonspecific finding on examination of urine 07/26/2013  . Sleep apnea 07/26/2013  . Urethral stricture 07/26/2013  . Malignant neoplasm of prostate ([place]) 07/04/2013  . Bladder neck contracture 07/04/2013  . Severe obesity (BMI >= 40) ([place]) 05/23/2013  . B12 deficiency 05/23/2013  . Hyperlipidemia with target LDL less than 100 09/02/2012  . HTN (hypertension) 05/26/2012  . Osteoarthritis, knee 05/26/2012  . MORBID OBESITY 08/22/2008    Past Surgical History:  Procedure Laterality Date  . COLONOSCOPY    . HERNIA REPAIR    . KNEE SURGERY    . MULTIPLE TOOTH EXTRACTIONS    . PROSTATE SURGERY    . RADIOACTIVE SEED IMPLANT    .  SHOULDER SURGERY    . TOTAL KNEE ARTHROPLASTY Right 11/19/2016   Procedure: RIGHT TOTAL KNEE ARTHROPLASTY;  Surgeon: Leandrew Koyanagi, MD;  Location: [facility];  Service: Orthopedics;  Laterality: Right;  . uretha surgery-2014         Home Medications    Prior to Admission medications   Medication Sig Start Date End Date Taking? Authorizing Provider  AMBULATORY NON FORMULARY MEDICATION Full Face CPAP Mask with Tubing Dx: G47.30 Patient not taking: Reported on 02/04/2017 11/12/16   Mariea Clonts, Tiffany L, DO    amLODipine (NORVASC) 10 MG tablet Take 1 tablet (10 mg total) by mouth daily. For high blood pressure 02/04/17   [name], NP  aspirin EC 81 MG tablet Take 81 mg by mouth daily.    [provider]  atorvastatin (LIPITOR) 10 MG tablet Take one tablet by mouth once daily for cholesterol 02/04/17   [name], NP  azithromycin (ZITHROMAX) 250 MG tablet Take 1 tablet (250 mg total) by mouth daily. Take first 2 tablets together, then 1 every day until finished. 03/13/17   Hayden Rasmussen, MD  carvedilol (COREG) 6.25 MG tablet Take 1 tablet (6.25 mg total) by mouth 2 (two) times daily with a meal. For high blood pressure 12/14/16   [name], NP  doxycycline (VIBRAMYCIN) 100 MG capsule Take 1 capsule (100 mg total) by mouth 2 (two) times daily. 02/19/17   Doristine Devoid, PA-C  glipiZIDE (GLUCOTROL) 5 MG tablet Take 1 tablet (5 mg total) by mouth daily before breakfast. 02/04/17   [name], NP  hydrALAZINE (APRESOLINE) 25 MG tablet Take 1 tablet (25 mg total) by mouth 3 (three) times daily. 12/14/16   [name], NP  HYDROcodone-acetaminophen (NORCO) 5-325 MG tablet Take 1 tablet by mouth every 6 (six) hours as needed. 02/19/17   Doristine Devoid, PA-C  JARDIANCE 10 MG TABS tablet TAKE 1 TABLET BY MOUTH ONCE DAILY Patient not taking: Reported on 02/04/2017 01/21/17   [name], NP  losartan (COZAAR) 100 MG tablet Take 1 tablet (100 mg total) by mouth daily. 12/14/16   [name], NP  meclizine (ANTIVERT) 12.5 MG tablet Take one to two tablets by mouth every 8 hours as needed for vertigo Patient not taking: Reported on 02/04/2017 12/15/16   [name], NP  metFORMIN (GLUCOPHAGE) 500 MG tablet TAKE 1 TABLET BY MOUTH 2 TIMES A DAY WITH A MEAL for diabetes 01/21/16   Estill Dooms, MD  omeprazole (PRILOSEC) 20 MG capsule Take 1 capsule (20 mg total) by mouth as needed. 12/14/16   [name], NP  ondansetron (ZOFRAN) 4  MG tablet Take 1-2 tablets (4-8 mg total) by mouth every 8 (eight) hours as needed for nausea or vomiting. Patient not taking: Reported on 02/04/2017 11/19/16   Leandrew Koyanagi, MD  oxybutynin (DITROPAN) 5 MG tablet Take 5 mg by mouth daily.    [provider]  potassium chloride SA (K-DUR,KLOR-CON) 20 MEQ tablet Take 1 tablet (20 mEq total) by mouth 2 (two) times daily. 12/14/16   [name], NP  rivaroxaban (XARELTO) 20 MG TABS tablet Take 1 tablet (20 mg total) by mouth daily with supper. 01/23/16   Volanda Napoleon, MD  senna-docusate (SENOKOT S) 8.6-50 MG tablet Take 1 tablet by mouth at bedtime as needed. 11/19/16   Leandrew Koyanagi, MD  traMADol (ULTRAM) 50 MG tablet Take 1 tablet (50 mg total) by mouth every  6 (six) hours as needed. for pain 02/04/17   [name], NP  TRUE METRIX BLOOD GLUCOSE TEST test strip CHECK BLOOD SUGAR TWICE PER DAY 10/24/15   [name], NP  TRUEPLUS LANCETS 30G MISC USE TO TEST BLOOD SUGAR 2 TIMES A DAY 09/12/14   [name], NP    Family History Family History  Problem Relation Age of Onset  . Breast cancer Mother 64       d.89  . Breast cancer Sister 10       d.30  . Leukemia Brother 18       d.20  . Breast cancer Maternal Aunt 40       d.40s  . Lung cancer Maternal Uncle   . Prostate cancer Paternal Uncle   . Prostate cancer Brother        recurred recently at age 45  . Other Brother 15       spinal tumor  . Cervical cancer Other 22       d.22  . Cancer Sister 7       unspecified type  . Cancer Maternal Uncle 80       unspecified type    Social History Social History   Tobacco Use  . Smoking status: Never Smoker  . Smokeless tobacco: Never Used  Substance Use Topics  . Alcohol use: No    Comment: never  . Drug use: No     Allergies   Lisinopril   Review of Systems Review of Systems  Cardiovascular: Positive for chest pain.  All other systems reviewed and are negative.    Physical  Exam Updated Vital Signs BP (!) 159/83   Pulse 64   Temp 97.6 F (36.4 C) (Oral)   Resp 20   Ht 5' 9" (1.753 m)   Wt (!) 140.6 kg (310 lb)   SpO2 95%   BMI 45.78 kg/m   Physical Exam  Constitutional: He is oriented to person, place, and time. He appears well-developed and well-nourished.  HENT:  Head: Normocephalic and atraumatic.  Cardiovascular: Normal rate and regular rhythm.  No murmur heard. Pulmonary/Chest: Effort normal and breath sounds normal. No respiratory distress. He exhibits no tenderness.  Abdominal: Soft. There is no tenderness. There is no rebound and no guarding.  Musculoskeletal: He exhibits no tenderness.  Nonpitting edema to BLE  Neurological: He is alert and oriented to person, place, and time.  Skin: Skin is warm and dry.  Psychiatric: He has a normal mood and affect. His behavior is normal.  Nursing note and vitals reviewed.    ED Treatments / Results  Labs (all labs ordered are listed, but only abnormal results are displayed) Labs Reviewed  BASIC METABOLIC PANEL - Abnormal; Notable for the following components:      Result Value   Potassium 3.4 (*)    Glucose, Bld 246 (*)    Calcium 8.3 (*)    All other components within normal limits  CBC - Abnormal; Notable for the following components:   Hemoglobin 12.4 (*)    HCT 38.2 (*)    All other components within normal limits  TROPONIN I    EKG  EKG Interpretation  Date/Time:  Friday April 02 2017 07:58:42 EST Ventricular Rate:  70 PR Interval:    QRS Duration: 93 QT Interval:  430 QTC Calculation: 464 R Axis:   33 Text Interpretation:  Sinus rhythm Baseline wander in lead(s) II III aVF Confirmed by Quintella Reichert [phone]) on  04/02/2017 8:28:31 AM       Radiology Dg Chest 2 View  Result Date: 04/02/2017 CLINICAL DATA:  Left chest pain EXAM: CHEST  2 VIEW COMPARISON:  03/13/2017 FINDINGS: Heart and mediastinal contours are within normal limits. No focal opacities or effusions. No  acute bony abnormality. Degenerative changes in the shoulders and thoracic spine. IMPRESSION: No active cardiopulmonary disease. Electronically Signed   By: Rolm Baptise M.D.   On: 04/02/2017 08:18   Ct Angio Chest Pe W/cm &/or Wo Cm  Result Date: 04/02/2017 CLINICAL DATA:  Shortness of breath. Pulmonary embolus suspected. Left anterior chest pain for 2 days. History of pulmonary embolus. EXAM: CT ANGIOGRAPHY CHEST WITH CONTRAST TECHNIQUE: Multidetector CT imaging of the chest was performed using the standard protocol during bolus administration of intravenous contrast. Multiplanar CT image reconstructions and MIPs were obtained to evaluate the vascular anatomy. CONTRAST:  124m ISOVUE-370 IOPAMIDOL (ISOVUE-370) INJECTION 76% COMPARISON:  06/13/2016 FINDINGS: Cardiovascular: The heart size is normal. No pericardial effusion. Mild aortic atherosclerosis. Atherosclerotic calcifications noted within the LAD, left circumflex coronary arteries. The main pulmonary artery appears patent. No saddle embolus. No lobar or segmental pulmonary artery filling defects identified. Mediastinum/Nodes: No enlarged mediastinal, hilar, or axillary lymph nodes. Thyroid gland, trachea, and esophagus demonstrate no significant findings. Lungs/Pleura: No pleural effusions, airspace consolidation or pneumothorax. Upper Abdomen: Large gallstone identified. This measures 3.8 cm. Unchanged left adrenal nodule measuring 2.4 cm, likely benign adenoma. No acute findings noted. Musculoskeletal: Degenerative disc disease identified within the thoracic spine. No aggressive lytic or sclerotic bone lesions. Review of the MIP images confirms the above findings. IMPRESSION: 1. Negative for acute pulmonary embolus. 2. Aortic atherosclerosis and atherosclerotic calcifications within the LAD and left circumflex coronary arteries. 3. Gallstone. Electronically Signed   By: TKerby MoorsM.D.   On: 04/02/2017 09:04    Procedures Procedures (including  critical care time)  Medications Ordered in ED Medications  iopamidol (ISOVUE-370) 76 % injection 125 mL (100 mLs Intravenous Contrast Given 04/02/17 0841)     Initial Impression / Assessment and Plan / ED Course  I have reviewed the triage vital signs and the nursing notes.  Pertinent labs & imaging results that were available during my care of the patient were reviewed by me and considered in my medical decision making (see chart for details).     Patient with history of DVT and PE currently on anticoagulants here for evaluation of left-sided intermittent chest pain.  The pain is not exertional in nature and he has no significant additional symptoms.  Presentation is not consistent with ACS.  Given his history of prior PE, CTA obtained.  CT is negative for PE.  There is an incidental finding of gallstone, which is known to the patient.  Presentation is not consistent with cholecystitis.  No evidence of pneumonia.  Discussed with patient home care for atypical chest pain, likely musculoskeletal pain.  Discussed outpatient follow-up as well as return precautions.  CT did incidentally notes coronary calcifications, patient referred for outpatient cardiology evaluation.  Final Clinical Impressions(s) / ED Diagnoses   Final diagnoses:  Atypical chest pain    ED Discharge Orders    None       RQuintella Reichert MD 04/02/17 [phone]

## 2017-04-02 NOTE — ED Triage Notes (Signed)
Pain in one spot on left side of chest with any movement x2 days.  No pain with deep breath.  Also pain across low back.

## 2017-04-09 DIAGNOSIS — R05 Cough: Secondary | ICD-10-CM | POA: Diagnosis not present

## 2017-04-09 DIAGNOSIS — R0602 Shortness of breath: Secondary | ICD-10-CM | POA: Diagnosis not present

## 2017-04-09 DIAGNOSIS — E119 Type 2 diabetes mellitus without complications: Secondary | ICD-10-CM | POA: Diagnosis not present

## 2017-04-09 DIAGNOSIS — J22 Unspecified acute lower respiratory infection: Secondary | ICD-10-CM | POA: Diagnosis not present

## 2017-04-09 MED FILL — OSELTAMIVIR PHOSPHATE 75 MG: 75 | 5 days supply | Qty: 10 | Fill #0

## 2017-04-09 MED FILL — AMOXICILLIN-POT CLAVULANATE: 875-125 | 10 days supply | Qty: 20 | Fill #0

## 2017-04-12 DIAGNOSIS — S838X9A Sprain of other specified parts of unspecified knee, initial encounter: Secondary | ICD-10-CM | POA: Diagnosis not present

## 2017-04-12 DIAGNOSIS — M1710 Unilateral primary osteoarthritis, unspecified knee: Secondary | ICD-10-CM | POA: Diagnosis not present

## 2017-04-12 DIAGNOSIS — G4730 Sleep apnea, unspecified: Secondary | ICD-10-CM | POA: Diagnosis not present

## 2017-04-12 DIAGNOSIS — G4733 Obstructive sleep apnea (adult) (pediatric): Secondary | ICD-10-CM | POA: Diagnosis not present

## 2017-04-15 ENCOUNTER — Other Ambulatory Visit: Payer: Medicare Other

## 2017-04-15 ENCOUNTER — Ambulatory Visit: Payer: Medicare Other | Admitting: Family

## 2017-05-04 ENCOUNTER — Other Ambulatory Visit: Payer: Medicare HMO

## 2017-05-04 DIAGNOSIS — I10 Essential (primary) hypertension: Secondary | ICD-10-CM | POA: Diagnosis not present

## 2017-05-04 DIAGNOSIS — E1122 Type 2 diabetes mellitus with diabetic chronic kidney disease: Secondary | ICD-10-CM

## 2017-05-04 DIAGNOSIS — E785 Hyperlipidemia, unspecified: Secondary | ICD-10-CM

## 2017-05-05 LAB — COMPLETE METABOLIC PANEL WITH GFR
AG Ratio: 1.1 (calc) (ref 1.0–2.5)
ALT: 9 U/L (ref 9–46)
AST: 10 U/L (ref 10–35)
Albumin: 3.7 g/dL (ref 3.6–5.1)
Alkaline phosphatase (APISO): 71 U/L (ref 40–115)
BUN: 16 mg/dL (ref 7–25)
CO2: 33 mmol/L — ABNORMAL HIGH (ref 20–32)
Calcium: 8.7 mg/dL (ref 8.6–10.3)
Chloride: 102 mmol/L (ref 98–110)
Creat: 1.07 mg/dL (ref 0.70–1.25)
GFR, Est African American: 83 mL/min/{1.73_m2} (ref >=60)
GFR, Est Non African American: 72 mL/min/{1.73_m2} (ref >=60)
Globulin: 3.3 g/dL (calc) (ref 1.9–3.7)
Glucose, Bld: 259 mg/dL — ABNORMAL HIGH (ref 65–99)
Potassium: 3.6 mmol/L (ref 3.5–5.3)
Sodium: 141 mmol/L (ref 135–146)
Total Bilirubin: 0.6 mg/dL (ref 0.2–1.2)
Total Protein: 7 g/dL (ref 6.1–8.1)

## 2017-05-05 LAB — HEMOGLOBIN A1C
Hgb A1c MFr Bld: 7.7 % of total Hgb — ABNORMAL HIGH (ref <5.7)
Mean Plasma Glucose: 174 (calc)
eAG (mmol/L): 9.7 (calc)

## 2017-05-05 LAB — LIPID PANEL
Cholesterol: 146 mg/dL (ref <200)
HDL: 47 mg/dL (ref >40)
LDL Cholesterol (Calc): 80 mg/dL (calc)
Non-HDL Cholesterol (Calc): 99 mg/dL (calc) (ref <130)
Total CHOL/HDL Ratio: 3.1 (calc) (ref <5.0)
Triglycerides: 104 mg/dL (ref <150)

## 2017-05-06 ENCOUNTER — Encounter: Payer: Self-pay | Admitting: Nurse Practitioner

## 2017-05-06 ENCOUNTER — Ambulatory Visit (INDEPENDENT_AMBULATORY_CARE_PROVIDER_SITE_OTHER): Payer: Medicare HMO | Admitting: Nurse Practitioner

## 2017-05-06 VITALS — BP 158/88 | HR 68 | Temp 98.7°F | Ht 67.0 in | Wt 309.0 lb

## 2017-05-06 DIAGNOSIS — E1122 Type 2 diabetes mellitus with diabetic chronic kidney disease: Secondary | ICD-10-CM

## 2017-05-06 DIAGNOSIS — M170 Bilateral primary osteoarthritis of knee: Secondary | ICD-10-CM

## 2017-05-06 DIAGNOSIS — E785 Hyperlipidemia, unspecified: Secondary | ICD-10-CM

## 2017-05-06 DIAGNOSIS — I2510 Atherosclerotic heart disease of native coronary artery without angina pectoris: Secondary | ICD-10-CM

## 2017-05-06 DIAGNOSIS — R42 Dizziness and giddiness: Secondary | ICD-10-CM

## 2017-05-06 DIAGNOSIS — I10 Essential (primary) hypertension: Secondary | ICD-10-CM | POA: Diagnosis not present

## 2017-05-06 MED ORDER — HYDRALAZINE HCL 25 MG PO TABS: 25.0000 mg | ORAL_TABLET | Freq: Three times a day (TID) | ORAL | 3 refills | Status: DC

## 2017-05-06 MED ORDER — EMPAGLIFLOZIN 10 MG PO TABS: 10.0000 mg | ORAL_TABLET | Freq: Every day | ORAL | 1 refills | Status: DC

## 2017-05-06 MED ORDER — TRAMADOL HCL 50 MG PO TABS: 50.0000 mg | ORAL_TABLET | Freq: Four times a day (QID) | ORAL | 0 refills | Status: DC | PRN

## 2017-05-06 MED ORDER — GLIPIZIDE 5 MG PO TABS: 5.0000 mg | ORAL_TABLET | Freq: Every day | ORAL | 1 refills | Status: DC

## 2017-05-06 MED ORDER — CARVEDILOL 6.25 MG PO TABS: 6.2500 mg | ORAL_TABLET | Freq: Two times a day (BID) | ORAL | 2 refills | Status: DC

## 2017-05-06 MED ORDER — AMLODIPINE BESYLATE 10 MG PO TABS: 10.0000 mg | ORAL_TABLET | Freq: Every day | ORAL | 1 refills | Status: DC

## 2017-05-06 MED ORDER — ATORVASTATIN CALCIUM 20 MG PO TABS: ORAL_TABLET | ORAL | 1 refills | Status: DC

## 2017-05-06 MED FILL — ATORVASTATIN 20 MG TABLET: 20 | 90 days supply | Qty: 90 | Fill #0

## 2017-05-06 MED FILL — glipiZIDE 5 MG TABS: 5 | 90 days supply | Qty: 90 | Fill #0

## 2017-05-06 MED FILL — AMLODIPINE BESYLATE 10 MG T: 10 | 90 days supply | Qty: 90 | Fill #0

## 2017-05-06 MED FILL — CARVEDILOL 6.25 MG TABLET: 6.25 | 90 days supply | Qty: 180 | Fill #0

## 2017-05-06 MED FILL — hydrALAZINE HCL 25 MG TABS: 25 | 90 days supply | Qty: 270 | Fill #0

## 2017-05-06 MED FILL — traMADol HCL 50 MG TABS: 50 | 8 days supply | Qty: 30 | Fill #0

## 2017-05-06 NOTE — Progress Notes (Signed)
Careteam: Patient Care Team: [name], NP as PCP - General (Geriatric Medicine) Debbra Riding, MD as Consulting Physician (Ophthalmology)  Advanced Directive information    Allergies  Allergen Reactions  . Lisinopril Cough    Chief Complaint  Patient presents with  . Medical Management of Chronic Issues    Pt is being seen for a 3 month routine visit.   . Other    Wife in room  . Medication Refill    tramadol - [place] Database has been verified. multple rx pended     HPI: Patient is a 67 y.o. male seen in the office today for 3 month follow up  Vertigo-unchanged, did not complete rehab because reports they were primarily focused on knee vs vestibular component.   OA- (knee pain)- right knee doing better post op, now having trouble with left knee.   DM- has been out of jardiance and diet has been poor because he has been frustrated with knee and dizziness.   HTN- did not take medication this morning, has in car and will take once he leaves here   Hyperlipidemia- taking lipitor 10 mg daily, LDL 80, goal <70.   Review of Systems:  Review of Systems  Constitutional: Negative for chills, fever and malaise/fatigue.  HENT: Negative for congestion, ear discharge, ear pain, hearing loss, sinus pain, sore throat and tinnitus.   Eyes: Negative for blurred vision.  Respiratory: Negative for cough and shortness of breath.   Cardiovascular: Negative for chest pain and palpitations.  Genitourinary: Positive for frequency (following by urology).  Musculoskeletal: Positive for joint pain and myalgias.  Neurological: Positive for dizziness and tingling (due to diabetes). Negative for sensory change, weakness and headaches.       Occasional sharp pains in head- lasting seconds Happens about 1-2 times a month.   Endo/Heme/Allergies: Negative for environmental allergies.  Psychiatric/Behavioral: Negative for memory loss.    Past Medical History:  Diagnosis Date  .  Arthritis   . Cancer [facility]) 2010   Prostate  . Chronic kidney disease   . Diabetes mellitus   . Diabetic neuropathy (HCC)    feet  . DVT (deep venous thrombosis) ([place])   . Genetic testing 06/22/2016   Mr. Wymer underwent genetic counseling and testing for hereditary cancer syndromes on 05/14/2016. His results were negative for mutations in all 46 genes analyzed by [facility]'s 46-gene Common Hereditary Cancers Panel. Genes analyzed include: APC, ATM, AXIN2, BARD1, BMPR1A, BRCA1, BRCA2, BRIP1, CDH1, CDKN2A, CHEK2, CTNNA1, DICER1, EPCAM, GREM1, HOXB13, KIT, MEN1, MLH1, MSH2, MSH3, MSH6, MUTYH, NB  . GERD (gastroesophageal reflux disease)   . Headache   . Hypertension   . PE (pulmonary thromboembolism) ([place])   . Pneumonia   . Sleep apnea    not wearing CPAP  . Wears glasses    Past Surgical History:  Procedure Laterality Date  . COLONOSCOPY    . HERNIA REPAIR    . KNEE SURGERY    . MULTIPLE TOOTH EXTRACTIONS    . PROSTATE SURGERY    . RADIOACTIVE SEED IMPLANT    . SHOULDER SURGERY    . TOTAL KNEE ARTHROPLASTY Right 11/19/2016   Procedure: RIGHT TOTAL KNEE ARTHROPLASTY;  Surgeon: Leandrew Koyanagi, MD;  Location: [facility];  Service: Orthopedics;  Laterality: Right;  . uretha surgery-2014     Social History:   reports that he has never smoked. He has never used smokeless tobacco. He reports that he does not drink alcohol or use drugs.  Family History  Problem Relation Age of Onset  . Breast cancer Mother 39       d.89  . Breast cancer Sister 68       d.30  . Leukemia Brother 18       d.20  . Breast cancer Maternal Aunt 40       d.40s  . Lung cancer Maternal Uncle   . Prostate cancer Paternal Uncle   . Prostate cancer Brother        recurred recently at age 14  . Other Brother 15       spinal tumor  . Cervical cancer Other 22       d.22  . Cancer Sister 70       unspecified type  . Cancer Maternal Uncle 80       unspecified type    Medications: Patient's Medications  New  Prescriptions   No medications on file  Previous Medications   AMBULATORY NON FORMULARY MEDICATION    Full Face CPAP Mask with Tubing Dx: G47.30   AMLODIPINE (NORVASC) 10 MG TABLET    Take 1 tablet (10 mg total) by mouth daily. For high blood pressure   ASPIRIN EC 81 MG TABLET    Take 81 mg by mouth daily.   ATORVASTATIN (LIPITOR) 10 MG TABLET    Take one tablet by mouth once daily for cholesterol   CARVEDILOL (COREG) 6.25 MG TABLET    Take 1 tablet (6.25 mg total) by mouth 2 (two) times daily with a meal. For high blood pressure   GLIPIZIDE (GLUCOTROL) 5 MG TABLET    Take 1 tablet (5 mg total) by mouth daily before breakfast.   HYDRALAZINE (APRESOLINE) 25 MG TABLET    Take 1 tablet (25 mg total) by mouth 3 (three) times daily.   HYDROCODONE-ACETAMINOPHEN (NORCO) 5-325 MG TABLET    Take 1 tablet by mouth every 6 (six) hours as needed.   JARDIANCE 10 MG TABS TABLET    TAKE 1 TABLET BY MOUTH ONCE DAILY   LOSARTAN (COZAAR) 100 MG TABLET    Take 1 tablet (100 mg total) by mouth daily.   MECLIZINE (ANTIVERT) 12.5 MG TABLET    Take one to two tablets by mouth every 8 hours as needed for vertigo   METFORMIN (GLUCOPHAGE) 500 MG TABLET    TAKE 1 TABLET BY MOUTH 2 TIMES A DAY WITH A MEAL for diabetes   OMEPRAZOLE (PRILOSEC) 20 MG CAPSULE    Take 1 capsule (20 mg total) by mouth as needed.   POTASSIUM CHLORIDE SA (K-DUR,KLOR-CON) 20 MEQ TABLET    Take 1 tablet (20 mEq total) by mouth 2 (two) times daily.   RIVAROXABAN (XARELTO) 20 MG TABS TABLET    Take 1 tablet (20 mg total) by mouth daily with supper.   SENNA-DOCUSATE (SENOKOT S) 8.6-50 MG TABLET    Take 1 tablet by mouth at bedtime as needed.   TRAMADOL (ULTRAM) 50 MG TABLET    Take 1 tablet (50 mg total) by mouth every 6 (six) hours as needed. for pain   TRUE METRIX BLOOD GLUCOSE TEST TEST STRIP    CHECK BLOOD SUGAR TWICE PER DAY   TRUEPLUS LANCETS 30G MISC    USE TO TEST BLOOD SUGAR 2 TIMES A DAY  Modified Medications   No medications on file    Discontinued Medications   AZITHROMYCIN (ZITHROMAX) 250 MG TABLET    Take 1 tablet (250 mg total) by mouth daily. Take first 2 tablets  together, then 1 every day until finished.   DOXYCYCLINE (VIBRAMYCIN) 100 MG CAPSULE    Take 1 capsule (100 mg total) by mouth 2 (two) times daily.   ONDANSETRON (ZOFRAN) 4 MG TABLET    Take 1-2 tablets (4-8 mg total) by mouth every 8 (eight) hours as needed for nausea or vomiting.   OXYBUTYNIN (DITROPAN) 5 MG TABLET    Take 5 mg by mouth daily.     Physical Exam:  Vitals:   05/06/17 1008  BP: (!) 158/88  Pulse: 68  Temp: 98.7 F (37.1 C)  TempSrc: Oral  SpO2: 96%  Weight: (!) 309 lb (140.2 kg)  Height: '5\' 7"'$  (1.702 m)   Body mass index is 48.4 kg/m.  Physical Exam  Constitutional: He appears well-developed and well-nourished.  HENT:  Head: Normocephalic and atraumatic.  Eyes: Pupils are equal, round, and reactive to light.  Neck: Normal range of motion. Neck supple.  Cardiovascular: Normal rate, regular rhythm and normal heart sounds.  Pulmonary/Chest: Effort normal and breath sounds normal.  Abdominal: Soft. Bowel sounds are normal.  obese  Musculoskeletal: Normal range of motion. He exhibits no edema.  Skin: Skin is warm and dry.    Labs reviewed: Basic Metabolic Panel: Recent Labs    02/04/17 1110  03/13/17 1246 04/02/17 0757 05/04/17 0812  NA 142   < > 138 140 141  K 3.9   < > 3.4* 3.4* 3.6  CL 101   < > 103 103 102  CO2 30   < > 26 26 33*  GLUCOSE 255*   < > 270* 246* 259*  BUN 16   < > 21* 20 16  CREATININE 1.07   < > 1.30* 1.13 1.07  CALCIUM 9.1   < > 8.4* 8.3* 8.7  TSH 1.08  --   --   --   --    < > = values in this interval not displayed.   Liver Function Tests: Recent Labs    09/22/16 1032 11/19/16 1240 12/10/16 0752 02/04/17 1110 05/04/17 0812  AST 8* 15 18 8* 10  ALT 8* 21 17 7* 9  ALKPHOS 91 63 71  --   --   BILITOT 0.6 1.0 0.90 0.6 0.6  PROT 7.2 7.2 8.2* 7.2 7.0  ALBUMIN 3.8 3.7 3.5  --   --     No results for input(s): LIPASE, AMYLASE in the last 8760 hours. No results for input(s): AMMONIA in the last 8760 hours. CBC: Recent Labs    02/04/17 1110 02/19/17 1310 03/13/17 1246 04/02/17 0757  WBC 6.5 19.3* 8.7 7.3  NEUTROABS 4,420 17.6* 6.6  --   HGB 13.1* 13.9 11.9* 12.4*  HCT 41.1 42.7 37.3* 38.2*  MCV 81.4 81.8 82.7 82.2  PLT 289 292 310 278   Lipid Panel: Recent Labs    09/22/16 1032 05/04/17 0812  CHOL 193 146  HDL 62 47  LDLCALC 112* 80  TRIG 95 104  CHOLHDL 3.1 3.1   TSH: Recent Labs    02/04/17 1110  TSH 1.08   A1C: Lab Results  Component Value Date   HGBA1C 7.7 (H) 05/04/2017     Assessment/Plan 1. Essential hypertension -has not taken medication today, stressed importance of taking blood pressure medication at the same time daily and lifestyle modifications (dash diet and exercise) - amLODipine (NORVASC) 10 MG tablet; Take 1 tablet (10 mg total) by mouth daily. For high blood pressure  Dispense: 90 tablet; Refill: 1 - carvedilol (COREG) 6.25  MG tablet; Take 1 tablet (6.25 mg total) by mouth 2 (two) times daily with a meal. For high blood pressure  Dispense: 180 tablet; Refill: 2 - hydrALAZINE (APRESOLINE) 25 MG tablet; Take 1 tablet (25 mg total) by mouth 3 (three) times daily.  Dispense: 270 tablet; Refill: 3  2. Type 2 diabetes mellitus with chronic kidney disease, without long-term current use of insulin, unspecified CKD stage ([place]) -worse, pt out of medication and diet has been poor. Discussed compliance with dietary modifications and medications. To call office if he has trouble affording medication.  - glipiZIDE (GLUCOTROL) 5 MG tablet; Take 1 tablet (5 mg total) by mouth daily before breakfast.  Dispense: 90 tablet; Refill: 1  3. Primary osteoarthritis of both knees -ongoing, does not wish to keep going to PT, ongoing follow up with ortho.  - traMADol (ULTRAM) 50 MG tablet; Take 1 tablet (50 mg total) by mouth every 6 (six) hours as  needed. for pain  Dispense: 30 tablet; Refill: 0  4. Hyperlipidemia with target LDL less than 70 -discussed dietary changes, to increase Lipitor to 20 mg daily to get to goal.  - atorvastatin (LIPITOR) 20 MG tablet; Take one tablet by mouth once daily for cholesterol  Dispense: 90 tablet; Refill: 1  5. Vertigo -continues, will come and goes. Currently not bothering him.   6. Atherosclerotic cardiovascular disease Aortic atherosclerosis and atherosclerotic calcifications within the LAD and left circumflex coronary arteries. Noted on CT from ED visit for atypical chest pain, otherwise work up was negative without change to EKG however would benefit from stress test and cardiology evaluation  - Ambulatory referral to Cardiology  Next appt: 08/05/2017 Janett Billow K. [place], [facility] [phone]

## 2017-05-06 NOTE — Patient Instructions (Addendum)
Increase Lipitor to 20 mg daily  Work on diet for diabetes and blood pressure and cholesterol   DASH Eating Plan DASH stands for "Dietary Approaches to Stop Hypertension." The DASH eating plan is a healthy eating plan that has been shown to reduce high blood pressure (hypertension). It may also reduce your risk for type 2 diabetes, heart disease, and stroke. The DASH eating plan may also help with weight loss. What are tips for following this plan? General guidelines  Avoid eating more than 2,300 mg (milligrams) of salt (sodium) a day. If you have hypertension, you may need to reduce your sodium intake to 1,500 mg a day.  Limit alcohol intake to no more than 1 drink a day for nonpregnant women and 2 drinks a day for men. One drink equals 12 oz of beer, 5 oz of wine, or 1 oz of hard liquor.  Work with your health care provider to maintain a healthy body weight or to lose weight. Ask what an ideal weight is for you.  Get at least 30 minutes of exercise that causes your heart to beat faster (aerobic exercise) most days of the week. Activities may include walking, swimming, or biking.  Work with your health care provider or diet and nutrition specialist (dietitian) to adjust your eating plan to your individual calorie needs. Reading food labels  Check food labels for the amount of sodium per serving. Choose foods with less than 5 percent of the Daily Value of sodium. Generally, foods with less than 300 mg of sodium per serving fit into this eating plan.  To find whole grains, look for the word "whole" as the first word in the ingredient list. Shopping  Buy products labeled as "low-sodium" or "no salt added."  Buy fresh foods. Avoid canned foods and premade or frozen meals. Cooking  Avoid adding salt when cooking. Use salt-free seasonings or herbs instead of table salt or sea salt. Check with your health care provider or pharmacist before using salt substitutes.  Do not fry foods. Cook  foods using healthy methods such as baking, boiling, grilling, and broiling instead.  Cook with heart-healthy oils, such as olive, canola, soybean, or sunflower oil. Meal planning   Eat a balanced diet that includes: ? 5 or more servings of fruits and vegetables each day. At each meal, try to fill half of your plate with fruits and vegetables. ? Up to 6-8 servings of whole grains each day. ? Less than 6 oz of lean meat, poultry, or fish each day. A 3-oz serving of meat is about the same size as a deck of cards. One egg equals 1 oz. ? 2 servings of low-fat dairy each day. ? A serving of nuts, seeds, or beans 5 times each week. ? Heart-healthy fats. Healthy fats called Omega-3 fatty acids are found in foods such as flaxseeds and coldwater fish, like sardines, salmon, and mackerel.  Limit how much you eat of the following: ? Canned or prepackaged foods. ? Food that is high in trans fat, such as fried foods. ? Food that is high in saturated fat, such as fatty meat. ? Sweets, desserts, sugary drinks, and other foods with added sugar. ? Full-fat dairy products.  Do not salt foods before eating.  Try to eat at least 2 vegetarian meals each week.  Eat more home-cooked food and less restaurant, buffet, and fast food.  When eating at a restaurant, ask that your food be prepared with less salt or no salt, if possible.  What foods are recommended? The items listed may not be a complete list. Talk with your dietitian about what dietary choices are best for you. Grains Whole-grain or whole-wheat bread. Whole-grain or whole-wheat pasta. Brown rice. [place]. Bulgur. Whole-grain and low-sodium cereals. Pita bread. Low-fat, low-sodium crackers. Whole-wheat flour tortillas. Vegetables Fresh or frozen vegetables (raw, steamed, roasted, or grilled). Low-sodium or reduced-sodium tomato and vegetable juice. Low-sodium or reduced-sodium tomato sauce and tomato paste. Low-sodium or reduced-sodium  canned vegetables. Fruits All fresh, dried, or frozen fruit. Canned fruit in natural juice (without added sugar). Meat and other protein foods Skinless chicken or [place]. Ground chicken or [place]. Pork with fat trimmed off. Fish and seafood. Egg whites. Dried beans, peas, or lentils. Unsalted nuts, nut butters, and seeds. Unsalted canned beans. Lean cuts of beef with fat trimmed off. Low-sodium, lean deli meat. Dairy Low-fat (1%) or fat-free (skim) milk. Fat-free, low-fat, or reduced-fat cheeses. Nonfat, low-sodium ricotta or cottage cheese. Low-fat or nonfat yogurt. Low-fat, low-sodium cheese. Fats and oils Soft margarine without trans fats. Vegetable oil. Low-fat, reduced-fat, or light mayonnaise and salad dressings (reduced-sodium). Canola, safflower, olive, soybean, and sunflower oils. Avocado. Seasoning and other foods Herbs. Spices. Seasoning mixes without salt. Unsalted popcorn and pretzels. Fat-free sweets. What foods are not recommended? The items listed may not be a complete list. Talk with your dietitian about what dietary choices are best for you. Grains Baked goods made with fat, such as croissants, muffins, or some breads. Dry pasta or rice meal packs. Vegetables Creamed or fried vegetables. Vegetables in a cheese sauce. Regular canned vegetables (not low-sodium or reduced-sodium). Regular canned tomato sauce and paste (not low-sodium or reduced-sodium). Regular tomato and vegetable juice (not low-sodium or reduced-sodium). Angie Fava. Olives. Fruits Canned fruit in a light or heavy syrup. Fried fruit. Fruit in cream or butter sauce. Meat and other protein foods Fatty cuts of meat. Ribs. Fried meat. Berniece Salines. Sausage. Bologna and other processed lunch meats. Salami. Fatback. Hotdogs. Bratwurst. Salted nuts and seeds. Canned beans with added salt. Canned or smoked fish. Whole eggs or egg yolks. Chicken or [place] with skin. Dairy Whole or 2% milk, cream, and half-and-half. Whole or  full-fat cream cheese. Whole-fat or sweetened yogurt. Full-fat cheese. Nondairy creamers. Whipped toppings. Processed cheese and cheese spreads. Fats and oils Butter. Stick margarine. Lard. Shortening. Ghee. Bacon fat. Tropical oils, such as coconut, palm kernel, or palm oil. Seasoning and other foods Salted popcorn and pretzels. Onion salt, garlic salt, seasoned salt, table salt, and sea salt. Worcestershire sauce. Tartar sauce. Barbecue sauce. Teriyaki sauce. Soy sauce, including reduced-sodium. Steak sauce. Canned and packaged gravies. Fish sauce. Oyster sauce. Cocktail sauce. Horseradish that you find on the shelf. Ketchup. Mustard. Meat flavorings and tenderizers. Bouillon cubes. Hot sauce and Tabasco sauce. Premade or packaged marinades. Premade or packaged taco seasonings. Relishes. Regular salad dressings. Where to find more information:  National Heart, Lung, and [place]: https://wilson-eaton.com/  American Heart Association: www.heart.org Summary  The DASH eating plan is a healthy eating plan that has been shown to reduce high blood pressure (hypertension). It may also reduce your risk for type 2 diabetes, heart disease, and stroke.  With the DASH eating plan, you should limit salt (sodium) intake to 2,300 mg a day. If you have hypertension, you may need to reduce your sodium intake to 1,500 mg a day.  When on the DASH eating plan, aim to eat more fresh fruits and vegetables, whole grains, lean proteins, low-fat dairy, and heart-healthy fats.  Work  with your health care provider or diet and nutrition specialist (dietitian) to adjust your eating plan to your individual calorie needs. This information is not intended to replace advice given to you by your health care provider. Make sure you discuss any questions you have with your health care provider. Document Released: 01/22/2011 Document Revised: 01/27/2016 Document Reviewed: 01/27/2016 Elsevier Interactive Patient Education  [employer].

## 2017-05-07 ENCOUNTER — Telehealth: Payer: Self-pay

## 2017-05-07 NOTE — Telephone Encounter (Signed)
Megan with [facility] called to say that patient had asked for a tier exception for the cost of Jardiance. Jinny Blossom stated that patient will have a $45 co-pay for the medication and patient cannot afford that. A tier exception was done 04/30/16 for this medication, so I used the same information to start approval process.   I initiated prior authorization for tier exception with [employer] through [email or web address]  Awaiting a response   Patient has been notified.

## 2017-05-10 DIAGNOSIS — S838X9A Sprain of other specified parts of unspecified knee, initial encounter: Secondary | ICD-10-CM | POA: Diagnosis not present

## 2017-05-10 DIAGNOSIS — M1710 Unilateral primary osteoarthritis, unspecified knee: Secondary | ICD-10-CM | POA: Diagnosis not present

## 2017-05-10 DIAGNOSIS — G4730 Sleep apnea, unspecified: Secondary | ICD-10-CM | POA: Diagnosis not present

## 2017-05-10 DIAGNOSIS — G4733 Obstructive sleep apnea (adult) (pediatric): Secondary | ICD-10-CM | POA: Diagnosis not present

## 2017-05-12 NOTE — Telephone Encounter (Signed)
I called patient to see if he needed samples of jardiance to last him until a response is received from [facility]. Patient stated that he has enough medication to last until next week and he does not need samples at this time.

## 2017-05-12 NOTE — Telephone Encounter (Signed)
Received fax from [facility] stating Jardiance coverage was DENIED.  Member ID: [id number]  Placed denial in [facility] folder to review.

## 2017-05-12 NOTE — Telephone Encounter (Signed)
I spoke with patient about setting up an appointment with Tivis Ringer to discuss possible medication change due to PA being denied for jardiance tier exception.   Medication will cost patient $45 per month without tier exception.   Please advise on possible alternatives

## 2017-05-13 ENCOUNTER — Encounter: Payer: Self-pay | Admitting: Interventional Cardiology

## 2017-05-13 ENCOUNTER — Ambulatory Visit (INDEPENDENT_AMBULATORY_CARE_PROVIDER_SITE_OTHER): Payer: Medicare Other | Admitting: Orthopaedic Surgery

## 2017-05-13 ENCOUNTER — Ambulatory Visit: Payer: Medicare HMO | Admitting: Interventional Cardiology

## 2017-05-13 VITALS — BP 156/72 | HR 73 | Ht 69.0 in | Wt 311.1 lb

## 2017-05-13 DIAGNOSIS — E782 Mixed hyperlipidemia: Secondary | ICD-10-CM

## 2017-05-13 DIAGNOSIS — I2510 Atherosclerotic heart disease of native coronary artery without angina pectoris: Secondary | ICD-10-CM | POA: Diagnosis not present

## 2017-05-13 DIAGNOSIS — I2699 Other pulmonary embolism without acute cor pulmonale: Secondary | ICD-10-CM | POA: Diagnosis not present

## 2017-05-13 NOTE — Patient Instructions (Signed)
Medication Instructions:  Your physician recommends that you continue on your current medications as directed. Please refer to the Current Medication list given to you today.   Labwork: None ordered  Testing/Procedures: None ordered  Follow-Up: Your physician wants you to follow-up in: 1 year with Dr. Irish Lack. You will receive a reminder letter in the mail two months in advance. If you don't receive a letter, please call our office to schedule the follow-up appointment.  You have been referred to [facility] at [facility].   Bariatric Surgery You have so much to gain by losing weight.  You may have already tried every diet and exercise plan imaginable.  And, you may have sought advice from your family physician, too.   Sometimes, in spite of such diligent efforts, you may not be able to achieve long-term results by yourself.  In cases of severe obesity, bariatric or weight loss surgery is a proven method of achieving long-term weight control.  Our Services Our bariatric surgery programs offer our patients new hope and long-term weight-loss solution.  Since introducing our services in 2003, we have conducted more than 2,400 successful procedures.  Our program is designated as a Programmer, multimedia by the Metabolic and Bariatric Surgery Accreditation and Quality Improvement Program (MBSAQIP), a IT trainer that sets rigorous patient safety and outcome standards.  Our program is also designated as a Ecologist by [employer].   Our exceptional weight-loss surgery team specializes in diagnosis, treatment, follow-up care, and ongoing support for our patients with severe weight loss challenges.  We currently offer laparoscopic sleeve gastrectomy, gastric bypass, and adjustable gastric band (LAP-BAND).    Attend our [place] Choosing to undergo a bariatric procedure is a big decision, and one that should not be taken lightly.  You  now have two options in how you learn about weight-loss surgery - in person or online.  Our objective is to ensure you have all of the information that you need to evaluate the advantages and obligations of this life changing procedure.  Please note that you are not alone in this process, and our experienced team is ready to assist and answer all of your questions.  There are several ways to register for a seminar (either on-line or in person): 1)  Call [phone] 2) Go on-line to [facility] and register for either type of seminar.  [email or web address]   Any Other Special Instructions Will Be Listed Below (If Applicable).     If you need a refill on your cardiac medications before your next appointment, please call your pharmacy.

## 2017-05-13 NOTE — Progress Notes (Signed)
Cardiology Office Note   Date:  05/13/2017   ID:  Thomas [name], DOB 1950-11-18, MRN [id number]  PCP:  [name], NP    No chief complaint on file.  Chest pain  Wt Readings from Last 3 Encounters:  05/13/17 (!) 311 lb 1.9 oz (141.1 kg)  05/06/17 (!) 309 lb (140.2 kg)  04/02/17 (!) 310 lb (140.6 kg)       History of Present Illness: Thomas [name] is a 67 y.o. male who is being seen today for the evaluation of chest pain at the request of [name], NP.  He had considered weight loss surgery in 2008.  Insurance changed and he could not get the surgery.    He had a PE in early 2018.  He has been on Xarelto since that time.    He had TKR in 2018.  He feels a left sided chest pain with emotional stress.  It is a dull pain, happens maybe once a week.  He does not walk much.  THis does not seem to bother his chest.  Going up stairs does not bother his chest.  It is not related to exertion.  There is no radiation, associated diaphoresis, nausea or vomiting.  Hehad a CT scan of the chest showing coronary calcifications. In Feb 2019: IMPRESSION: 1. Negative for acute pulmonary embolus. 2. Aortic atherosclerosis and atherosclerotic calcifications within the LAD and left circumflex coronary arteries. 3. Gallstone.  In 2008 he had a cath:  HEMODYNAMIC RESULTS:  Left ventricular pressure 158/61, LVEDP of 22  mmHg.  Aortic pressure of 159/90 with a mean aortic pressure of 117  mmHg.   IMPRESSION:  1. No significant coronary artery disease.  2. Normal ventricular function with ejection fraction estimated to be      at 55%.  3. No renal artery stenosis.  4. Mildly elevated left ventricular end-diastolic pressure.  At this time, he feels that his heart is okay.  He does not feel that his pain is related to his heart.  No other orthopnea, lightheadedness  No other, lightheadedness, orthopnea, associated diaphoresis, nausea, vomiting or  syncope.     Past Medical History:  Diagnosis Date  . Arthritis   . Cancer [facility]) 2010   Prostate  . Chronic kidney disease   . Diabetes mellitus   . Diabetic neuropathy (HCC)    feet  . DVT (deep venous thrombosis) ([place])   . Genetic testing 06/22/2016   Mr. Neenan underwent genetic counseling and testing for hereditary cancer syndromes on 05/14/2016. His results were negative for mutations in all 46 genes analyzed by [facility]'s 46-gene Common Hereditary Cancers Panel. Genes analyzed include: APC, ATM, AXIN2, BARD1, BMPR1A, BRCA1, BRCA2, BRIP1, CDH1, CDKN2A, CHEK2, CTNNA1, DICER1, EPCAM, GREM1, HOXB13, KIT, MEN1, MLH1, MSH2, MSH3, MSH6, MUTYH, NB  . GERD (gastroesophageal reflux disease)   . Headache   . Hypertension   . PE (pulmonary thromboembolism) ([place])   . Pneumonia   . Sleep apnea    not wearing CPAP  . Wears glasses     Past Surgical History:  Procedure Laterality Date  . COLONOSCOPY    . HERNIA REPAIR    . KNEE SURGERY    . MULTIPLE TOOTH EXTRACTIONS    . PROSTATE SURGERY    . RADIOACTIVE SEED IMPLANT    . SHOULDER SURGERY    . TOTAL KNEE ARTHROPLASTY Right 11/19/2016   Procedure: RIGHT TOTAL KNEE ARTHROPLASTY;  Surgeon: Leandrew Koyanagi, MD;  Location:  [facility] OR;  Service: Orthopedics;  Laterality: Right;  . uretha surgery-2014       Current Outpatient Medications  Medication Sig Dispense Refill  . AMBULATORY NON FORMULARY MEDICATION Full Face CPAP Mask with Tubing Dx: G47.30 1 each 0  . amLODipine (NORVASC) 10 MG tablet Take 1 tablet (10 mg total) by mouth daily. For high blood pressure 90 tablet 1  . aspirin EC 81 MG tablet Take 81 mg by mouth daily.    Marland Kitchen atorvastatin (LIPITOR) 20 MG tablet Take one tablet by mouth once daily for cholesterol 90 tablet 1  . carvedilol (COREG) 6.25 MG tablet Take 1 tablet (6.25 mg total) by mouth 2 (two) times daily with a meal. For high blood pressure 180 tablet 2  . empagliflozin (JARDIANCE) 10 MG TABS tablet Take 10 mg by mouth  daily. 90 tablet 1  . glipiZIDE (GLUCOTROL) 5 MG tablet Take 1 tablet (5 mg total) by mouth daily before breakfast. 90 tablet 1  . hydrALAZINE (APRESOLINE) 25 MG tablet Take 1 tablet (25 mg total) by mouth 3 (three) times daily. 270 tablet 3  . losartan (COZAAR) 100 MG tablet Take 1 tablet (100 mg total) by mouth daily. 90 tablet 3  . metFORMIN (GLUCOPHAGE) 500 MG tablet TAKE 1 TABLET BY MOUTH 2 TIMES A DAY WITH A MEAL for diabetes 180 tablet 3  . omeprazole (PRILOSEC) 20 MG capsule Take 1 capsule (20 mg total) by mouth as needed. 30 capsule 1  . potassium chloride SA (K-DUR,KLOR-CON) 20 MEQ tablet Take 1 tablet (20 mEq total) by mouth 2 (two) times daily. 180 tablet 1  . rivaroxaban (XARELTO) 20 MG TABS tablet Take 1 tablet (20 mg total) by mouth daily with supper. 30 tablet 12  . senna-docusate (SENOKOT S) 8.6-50 MG tablet Take 1 tablet by mouth at bedtime as needed. 30 tablet 1  . traMADol (ULTRAM) 50 MG tablet Take 1 tablet (50 mg total) by mouth every 6 (six) hours as needed. for pain 30 tablet 0  . TRUE METRIX BLOOD GLUCOSE TEST test strip CHECK BLOOD SUGAR TWICE PER DAY 100 each PRN  . TRUEPLUS LANCETS 30G MISC USE TO TEST BLOOD SUGAR 2 TIMES A DAY 100 each 12   No current facility-administered medications for this visit.     Allergies:   Lisinopril    Social History:  The patient  reports that he has never smoked. He has never used smokeless tobacco. He reports that he does not drink alcohol or use drugs.   Family History:  The patient's family history includes Breast cancer (age of onset: 39) in his sister; Breast cancer (age of onset: 75) in his maternal aunt; Breast cancer (age of onset: 78) in his mother; Cancer (age of onset: 44) in his sister; Cancer (age of onset: 63) in his maternal uncle; Cervical cancer (age of onset: 34) in his other; Leukemia (age of onset: 39) in his brother; Lung cancer in his maternal uncle; Other (age of onset: 55) in his brother; Prostate cancer in his  brother and paternal uncle.    ROS:  Please see the history of present illness.   Otherwise, review of systems are positive for knee pain.   All other systems are reviewed and negative.    PHYSICAL EXAM: VS:  BP (!) 156/72   Pulse 73   Ht '5\' 9"'$  (1.753 m)   Wt (!) 311 lb 1.9 oz (141.1 kg)   SpO2 93%   BMI 45.94 kg/m  , BMI  Body mass index is 45.94 kg/m. GEN: Well nourished, well developed, in no acute distress  HEENT: normal  Neck: no JVD, carotid bruits, or masses Cardiac: RRR; no murmurs, rubs, or gallops,no edema  Respiratory:  clear to auscultation bilaterally, normal work of breathing GI: soft, nontender, nondistended, + BS MS: no deformity or atrophy  Skin: warm and dry, no rash Neuro:  Strength and sensation are intact Psych: euthymic mood, full affect   EKG:   The ekg ordered in Feb 2019 demonstrates NSR, nonspecific ST changes   Recent Labs: 06/13/2016: B Natriuretic Peptide 156.8 02/04/2017: TSH 1.08 04/02/2017: Hemoglobin 12.4; Platelets 278 05/04/2017: ALT 9; BUN 16; Creat 1.07; Potassium 3.6; Sodium 141   Lipid Panel    Component Value Date/Time   CHOL 146 05/04/2017 0812   CHOL 153 03/08/2015 0834   TRIG 104 05/04/2017 0812   HDL 47 05/04/2017 0812   HDL 52 03/08/2015 0834   CHOLHDL 3.1 05/04/2017 0812   VLDL 19 09/22/2016 1032   LDLCALC 80 05/04/2017 0812     Other studies Reviewed: Additional studies/ records that were reviewed today with results demonstrating: prior cath reviewed.   ASSESSMENT AND PLAN:  1.  Coronary artery calcification: No clear signs of angina.  He has atypical chest pain.  Continue regular activities.  If he develops more significant symptoms, he will let us know.  Prior cardiac cath showed only minimal coronary artery disease.  I am not surprised there were coronary artery calcifications on the CT scan given his prior cath findings. 2. Morbid obesity: Refer for weight loss surgery eval.  He had an eval in 2008 but it did not  work out due to [employer]. 3. Hyperlipidemia: LDL 80 in March 2019.  Continue atorvastatin.  Continue aggressive risk factor modification. 4. PE: Xarelto prescribed.     Current medicines are reviewed at length with the patient today.  The patient concerns regarding his medicines were addressed.  The following changes have been made:  No change  Labs/ tests ordered today include:  No orders of the defined types were placed in this encounter.   Recommend 150 minutes/week of aerobic exercise Low fat, low carb, high fiber diet recommended  Disposition:   FU in 1 year   Signed, Larae Grooms, MD  05/13/2017 11:15 AM    [address], [place], [place]  [address] Phone: [phone]; Fax: [phone]

## 2017-05-14 NOTE — Telephone Encounter (Signed)
It appears that $45 is the lowest branded copay for his plan.  I do not know of another drug in class that would be less expensive.

## 2017-05-17 NOTE — Telephone Encounter (Signed)
I spoke with patient's wife and she stated that she would have to speak with her husband to see if they could afford the $45 per month.

## 2017-05-18 ENCOUNTER — Ambulatory Visit (INDEPENDENT_AMBULATORY_CARE_PROVIDER_SITE_OTHER): Payer: Medicare HMO | Admitting: Orthopaedic Surgery

## 2017-05-18 ENCOUNTER — Encounter (INDEPENDENT_AMBULATORY_CARE_PROVIDER_SITE_OTHER): Payer: Self-pay | Admitting: Orthopaedic Surgery

## 2017-05-18 ENCOUNTER — Ambulatory Visit (INDEPENDENT_AMBULATORY_CARE_PROVIDER_SITE_OTHER): Payer: Medicare HMO

## 2017-05-18 DIAGNOSIS — Z96651 Presence of right artificial knee joint: Secondary | ICD-10-CM | POA: Diagnosis not present

## 2017-05-18 NOTE — Progress Notes (Signed)
 Post-Op Visit Note   Patient: Thomas [name]           Date of Birth: 10/14/1950           MRN: [id number] Visit Date: 05/18/2017 PCP: [name], Jessica K, NP   Assessment & Plan:  Chief Complaint:  Chief Complaint  Patient presents with  . Right Knee - Pain   Visit Diagnoses:  1. Status post total right knee replacement     Plan: Patient comes in for follow-up.  6 months status post right total knee replacement, date of surgery 11/29/2016.  Doing well in regards to pain.  His main issue remains range of motion.  He went to about 2 months of physical therapy but then stopped.  He says he has been doing this on his own at home but is not regained the motion that he would like.  Examination of the right knee shows range of motion of 5-85 degrees.  He is stable to valgus and varus stress.  He is neurovascular intact distally.  At this point, we have discussed the inability to manipulate his knee as he is greater than 10 weeks out from surgery.  We have discussed going back to outpatient physical therapy but the patient does not want to do this.  He says that he will continue to work on things on his own.  Dental prophylaxis was discussed at this appointment.  He will follow-up with us in 3 months time for repeat evaluation.  He will call if concerns or questions in the meantime.  Follow-Up Instructions: Return in about 3 months (around 08/17/2017).   Orders:  Orders Placed This Encounter  Procedures  . XR KNEE 3 VIEW RIGHT   No orders of the defined types were placed in this encounter.   Imaging: Xr Knee 3 View Right  Result Date: 05/18/2017 X-rays show a well-seated prosthesis without evidence of subsidence or osteolysis.   PMFS History: Patient Active Problem List   Diagnosis Date Noted  . Total knee replacement status 11/19/2016  . Meniscal injury 10/09/2016  . Hypercoagulable state (HCC) 09/22/2016  . Genetic testing 06/22/2016  . DVT, lower extremity, proximal,  acute, left (HCC) 01/23/2016  . Hematuria 12/12/2015  . Pulmonary embolism (HCC) 11/23/2015  . Type 2 diabetes mellitus with chronic kidney disease, without long-term current use of insulin (HCC) 12/05/2014  . Benign prostatic hyperplasia with urinary obstruction 07/25/2014  . Urine retention 07/25/2014  . Urinary tract infection 07/09/2014  . Anemia, iron deficiency 11/22/2013  . Urinary retention 11/01/2013  . Incomplete emptying of bladder 07/26/2013  . Nocturia 07/26/2013  . Other nonspecific finding on examination of urine 07/26/2013  . Sleep apnea 07/26/2013  . Urethral stricture 07/26/2013  . Malignant neoplasm of prostate (HCC) 07/04/2013  . Bladder neck contracture 07/04/2013  . Severe obesity (BMI >= 40) (HCC) 05/23/2013  . B12 deficiency 05/23/2013  . Hyperlipidemia with target LDL less than 100 09/02/2012  . HTN (hypertension) 05/26/2012  . Osteoarthritis, knee 05/26/2012  . MORBID OBESITY 08/22/2008   Past Medical History:  Diagnosis Date  . Arthritis   . Cancer (HCC) 2010   Prostate  . Chronic kidney disease   . Diabetes mellitus   . Diabetic neuropathy (HCC)    feet  . DVT (deep venous thrombosis) (HCC)   . Genetic testing 06/22/2016   Mr. West underwent genetic counseling and testing for hereditary cancer syndromes on 05/14/2016. His results were negative for mutations in all 46 genes analyzed by   [employer]'s 46-gene Common Hereditary Cancers Panel. Genes analyzed include: APC, ATM, AXIN2, BARD1, BMPR1A, BRCA1, BRCA2, BRIP1, CDH1, CDKN2A, CHEK2, CTNNA1, DICER1, EPCAM, GREM1, HOXB13, KIT, MEN1, MLH1, MSH2, MSH3, MSH6, MUTYH, NB  . GERD (gastroesophageal reflux disease)   . Headache   . Hypertension   . PE (pulmonary thromboembolism) (HCC)   . Pneumonia   . Sleep apnea    not wearing CPAP  . Wears glasses     Family History  Problem Relation Age of Onset  . Breast cancer Mother 89       d.89  . Breast cancer Sister 30       d.30  . Leukemia Brother 18        d.20  . Breast cancer Maternal Aunt 40       d.40s  . Lung cancer Maternal Uncle   . Prostate cancer Paternal Uncle   . Prostate cancer Brother        recurred recently at age 68  . Other Brother 15       spinal tumor  . Cervical cancer Other 22       d.22  . Cancer Sister 66       unspecified type  . Cancer Maternal Uncle 80       unspecified type    Past Surgical History:  Procedure Laterality Date  . COLONOSCOPY    . HERNIA REPAIR    . KNEE SURGERY    . MULTIPLE TOOTH EXTRACTIONS    . PROSTATE SURGERY    . RADIOACTIVE SEED IMPLANT    . SHOULDER SURGERY    . TOTAL KNEE ARTHROPLASTY Right 11/19/2016   Procedure: RIGHT TOTAL KNEE ARTHROPLASTY;  Surgeon: Xu, Naiping M, MD;  Location: [facility] OR;  Service: Orthopedics;  Laterality: Right;  . uretha surgery-2014     Social History   Occupational History  . Not on file  Tobacco Use  . Smoking status: Never Smoker  . Smokeless tobacco: Never Used  Substance and Sexual Activity  . Alcohol use: No    Comment: never  . Drug use: No  . Sexual activity: Yes    

## 2017-05-31 ENCOUNTER — Emergency Department (HOSPITAL_BASED_OUTPATIENT_CLINIC_OR_DEPARTMENT_OTHER): Payer: Medicare HMO

## 2017-05-31 ENCOUNTER — Encounter (HOSPITAL_BASED_OUTPATIENT_CLINIC_OR_DEPARTMENT_OTHER): Payer: Self-pay | Admitting: *Deleted

## 2017-05-31 ENCOUNTER — Other Ambulatory Visit: Payer: Self-pay

## 2017-05-31 ENCOUNTER — Inpatient Hospital Stay (HOSPITAL_BASED_OUTPATIENT_CLINIC_OR_DEPARTMENT_OTHER)
Admission: EM | Admit: 2017-05-31 | Discharge: 2017-06-03 | DRG: 308 | Disposition: A | Discharge status: Home / Self Care | Payer: Medicare HMO | Attending: Internal Medicine | Admitting: Internal Medicine

## 2017-05-31 DIAGNOSIS — I129 Hypertensive chronic kidney disease with stage 1 through stage 4 chronic kidney disease, or unspecified chronic kidney disease: Secondary | ICD-10-CM | POA: Diagnosis not present

## 2017-05-31 DIAGNOSIS — M1990 Unspecified osteoarthritis, unspecified site: Secondary | ICD-10-CM | POA: Diagnosis not present

## 2017-05-31 DIAGNOSIS — Z8546 Personal history of malignant neoplasm of prostate: Secondary | ICD-10-CM

## 2017-05-31 DIAGNOSIS — R079 Chest pain, unspecified: Secondary | ICD-10-CM | POA: Diagnosis not present

## 2017-05-31 DIAGNOSIS — I700 Atherosclerosis of aorta: Secondary | ICD-10-CM | POA: Diagnosis present

## 2017-05-31 DIAGNOSIS — Z888 Allergy status to other drugs, medicaments and biological substances status: Secondary | ICD-10-CM

## 2017-05-31 DIAGNOSIS — I480 Paroxysmal atrial fibrillation: Secondary | ICD-10-CM | POA: Diagnosis present

## 2017-05-31 DIAGNOSIS — I481 Persistent atrial fibrillation: Secondary | ICD-10-CM | POA: Diagnosis not present

## 2017-05-31 DIAGNOSIS — E1140 Type 2 diabetes mellitus with diabetic neuropathy, unspecified: Secondary | ICD-10-CM | POA: Diagnosis present

## 2017-05-31 DIAGNOSIS — E785 Hyperlipidemia, unspecified: Secondary | ICD-10-CM | POA: Diagnosis present

## 2017-05-31 DIAGNOSIS — E1122 Type 2 diabetes mellitus with diabetic chronic kidney disease: Secondary | ICD-10-CM | POA: Diagnosis present

## 2017-05-31 DIAGNOSIS — I2510 Atherosclerotic heart disease of native coronary artery without angina pectoris: Secondary | ICD-10-CM | POA: Diagnosis present

## 2017-05-31 DIAGNOSIS — Z6841 Body Mass Index (BMI) 40.0 and over, adult: Secondary | ICD-10-CM | POA: Diagnosis not present

## 2017-05-31 DIAGNOSIS — I5043 Acute on chronic combined systolic (congestive) and diastolic (congestive) heart failure: Secondary | ICD-10-CM | POA: Diagnosis present

## 2017-05-31 DIAGNOSIS — N183 Chronic kidney disease, stage 3 unspecified: Secondary | ICD-10-CM | POA: Diagnosis present

## 2017-05-31 DIAGNOSIS — Z806 Family history of leukemia: Secondary | ICD-10-CM

## 2017-05-31 DIAGNOSIS — K8020 Calculus of gallbladder without cholecystitis without obstruction: Secondary | ICD-10-CM | POA: Diagnosis present

## 2017-05-31 DIAGNOSIS — Z7901 Long term (current) use of anticoagulants: Secondary | ICD-10-CM

## 2017-05-31 DIAGNOSIS — I5022 Chronic systolic (congestive) heart failure: Secondary | ICD-10-CM | POA: Diagnosis present

## 2017-05-31 DIAGNOSIS — I4891 Unspecified atrial fibrillation: Secondary | ICD-10-CM | POA: Diagnosis present

## 2017-05-31 DIAGNOSIS — K219 Gastro-esophageal reflux disease without esophagitis: Secondary | ICD-10-CM | POA: Diagnosis present

## 2017-05-31 DIAGNOSIS — I824Y2 Acute embolism and thrombosis of unspecified deep veins of left proximal lower extremity: Secondary | ICD-10-CM | POA: Diagnosis not present

## 2017-05-31 DIAGNOSIS — Z801 Family history of malignant neoplasm of trachea, bronchus and lung: Secondary | ICD-10-CM

## 2017-05-31 DIAGNOSIS — G4733 Obstructive sleep apnea (adult) (pediatric): Secondary | ICD-10-CM | POA: Diagnosis present

## 2017-05-31 DIAGNOSIS — I471 Supraventricular tachycardia: Secondary | ICD-10-CM | POA: Diagnosis present

## 2017-05-31 DIAGNOSIS — G4730 Sleep apnea, unspecified: Secondary | ICD-10-CM | POA: Diagnosis present

## 2017-05-31 DIAGNOSIS — Z8042 Family history of malignant neoplasm of prostate: Secondary | ICD-10-CM

## 2017-05-31 DIAGNOSIS — Z86711 Personal history of pulmonary embolism: Secondary | ICD-10-CM | POA: Diagnosis not present

## 2017-05-31 DIAGNOSIS — I10 Essential (primary) hypertension: Secondary | ICD-10-CM | POA: Diagnosis present

## 2017-05-31 DIAGNOSIS — Z86718 Personal history of other venous thrombosis and embolism: Secondary | ICD-10-CM | POA: Diagnosis not present

## 2017-05-31 DIAGNOSIS — Z803 Family history of malignant neoplasm of breast: Secondary | ICD-10-CM

## 2017-05-31 DIAGNOSIS — Z923 Personal history of irradiation: Secondary | ICD-10-CM | POA: Diagnosis not present

## 2017-05-31 DIAGNOSIS — Z7984 Long term (current) use of oral hypoglycemic drugs: Secondary | ICD-10-CM

## 2017-05-31 DIAGNOSIS — I130 Hypertensive heart and chronic kidney disease with heart failure and stage 1 through stage 4 chronic kidney disease, or unspecified chronic kidney disease: Secondary | ICD-10-CM | POA: Diagnosis present

## 2017-05-31 DIAGNOSIS — Z96651 Presence of right artificial knee joint: Secondary | ICD-10-CM | POA: Diagnosis present

## 2017-05-31 DIAGNOSIS — I5033 Acute on chronic diastolic (congestive) heart failure: Secondary | ICD-10-CM | POA: Diagnosis not present

## 2017-05-31 DIAGNOSIS — R0602 Shortness of breath: Secondary | ICD-10-CM | POA: Diagnosis not present

## 2017-05-31 DIAGNOSIS — Z7982 Long term (current) use of aspirin: Secondary | ICD-10-CM

## 2017-05-31 DIAGNOSIS — I2782 Chronic pulmonary embolism: Secondary | ICD-10-CM | POA: Diagnosis not present

## 2017-05-31 DIAGNOSIS — I2699 Other pulmonary embolism without acute cor pulmonale: Secondary | ICD-10-CM | POA: Diagnosis present

## 2017-05-31 HISTORY — DX: Acute on chronic diastolic (congestive) heart failure: I50.33

## 2017-05-31 LAB — COMPREHENSIVE METABOLIC PANEL
ALT: 13 U/L — ABNORMAL LOW (ref 17–63)
AST: 14 U/L — ABNORMAL LOW (ref 15–41)
Albumin: 3.6 g/dL (ref 3.5–5.0)
Alkaline Phosphatase: 67 U/L (ref 38–126)
Anion gap: 10 (ref 5–15)
BUN: 20 mg/dL (ref 6–20)
CO2: 22 mmol/L (ref 22–32)
Calcium: 8.7 mg/dL — ABNORMAL LOW (ref 8.9–10.3)
Chloride: 106 mmol/L (ref 101–111)
Creatinine, Ser: 1.26 mg/dL — ABNORMAL HIGH (ref 0.61–1.24)
GFR calc Af Amer: >60 mL/min (ref >60)
GFR calc non Af Amer: 58 mL/min — ABNORMAL LOW (ref >60)
Glucose, Bld: 164 mg/dL — ABNORMAL HIGH (ref 65–99)
Potassium: 3.3 mmol/L — ABNORMAL LOW (ref 3.5–5.1)
Sodium: 138 mmol/L (ref 135–145)
Total Bilirubin: 0.7 mg/dL (ref 0.3–1.2)
Total Protein: 7.6 g/dL (ref 6.5–8.1)

## 2017-05-31 LAB — CBC WITH DIFFERENTIAL/PLATELET
Basophils Absolute: 0 10*3/uL (ref 0.0–0.1)
Basophils Relative: 0 %
Eosinophils Absolute: 0 10*3/uL (ref 0.0–0.7)
Eosinophils Relative: 1 %
HCT: 37 % — ABNORMAL LOW (ref 39.0–52.0)
Hemoglobin: 12 g/dL — ABNORMAL LOW (ref 13.0–17.0)
Lymphocytes Relative: 21 %
Lymphs Abs: 1.7 10*3/uL (ref 0.7–4.0)
MCH: 27.3 pg (ref 26.0–34.0)
MCHC: 32.4 g/dL (ref 30.0–36.0)
MCV: 84.3 fL (ref 78.0–100.0)
Monocytes Absolute: 0.5 10*3/uL (ref 0.1–1.0)
Monocytes Relative: 6 %
Neutro Abs: 5.8 10*3/uL (ref 1.7–7.7)
Neutrophils Relative %: 72 %
Platelets: 275 10*3/uL (ref 150–400)
RBC: 4.39 MIL/uL (ref 4.22–5.81)
RDW: 15 % (ref 11.5–15.5)
WBC: 8 10*3/uL (ref 4.0–10.5)

## 2017-05-31 LAB — URINALYSIS, ROUTINE W REFLEX MICROSCOPIC
Bilirubin Urine: NEGATIVE
Glucose, UA: NEGATIVE mg/dL
Ketones, ur: NEGATIVE mg/dL
Nitrite: NEGATIVE
Protein, ur: NEGATIVE mg/dL
Specific Gravity, Urine: 1.02 (ref 1.005–1.030)
pH: 6 (ref 5.0–8.0)

## 2017-05-31 LAB — URINALYSIS, MICROSCOPIC (REFLEX)

## 2017-05-31 LAB — BRAIN NATRIURETIC PEPTIDE: B Natriuretic Peptide: 450.6 pg/mL — ABNORMAL HIGH (ref 0.0–100.0)

## 2017-05-31 LAB — CBG MONITORING, ED: Glucose-Capillary: 178 mg/dL — ABNORMAL HIGH (ref 65–99)

## 2017-05-31 LAB — TROPONIN I: Troponin I: <0.03 ng/mL (ref <0.03)

## 2017-05-31 MED ORDER — FUROSEMIDE 10 MG/ML IJ SOLN
40.0000 mg | Freq: Once | INTRAMUSCULAR | Status: AC
  Administered 2017-05-31: 40 mg via INTRAVENOUS
  Filled 2017-05-31: qty 4

## 2017-05-31 MED ORDER — DILTIAZEM LOAD VIA INFUSION
15.0000 mg | Freq: Once | INTRAVENOUS | Status: DC
  Administered 2017-05-31: 15 mg via INTRAVENOUS
  Filled 2017-05-31: qty 15

## 2017-05-31 MED ORDER — DILTIAZEM HCL 100 MG IV SOLR
5.0000 mg/h | INTRAVENOUS | Status: DC
  Administered 2017-05-31: 5 mg/h via INTRAVENOUS
  Administered 2017-06-01: 7.5 mg/h via INTRAVENOUS
  Filled 2017-05-31 (×2): qty 100

## 2017-05-31 MED ORDER — MORPHINE SULFATE (PF) 2 MG/ML IV SOLN: 2.0000 mg | INTRAVENOUS | Status: DC | PRN

## 2017-05-31 MED ORDER — POTASSIUM CHLORIDE 20 MEQ/15ML (10%) PO SOLN
40.0000 meq | Freq: Once | ORAL | Status: AC
  Administered 2017-05-31: 40 meq via ORAL
  Filled 2017-05-31: qty 30

## 2017-05-31 MED ORDER — NITROGLYCERIN 0.4 MG SL SUBL: 0.4000 mg | SUBLINGUAL_TABLET | SUBLINGUAL | Status: DC | PRN

## 2017-05-31 NOTE — ED Provider Notes (Signed)
[facility] EMERGENCY DEPARTMENT Provider Note   CSN: [id number] Arrival date & time: 05/31/17  1740     History   Chief Complaint Chief Complaint  Patient presents with  . Shortness of Breath    HPI Thomas [name] is a 67 y.o. male.  The history is provided by the patient. No language interpreter was used.  Shortness of Breath     Thomas [name] is a 67 y.o. male who presents to the Emergency Department complaining of sob. He presents to the emergency department complaining of dizziness, shortness of breath and chest pain. He has a history of chronic vertigo and experiences intermittent dizziness frequently. Yesterday he began to feel dizziness as well as shortness of breath and intermittent left-sided chest pain. He has experienced similar symptoms in the past and has been evaluated in the emergency department and by cardiology. He denies any fevers but he does have a cough productive of clear sputum. He denies any abdominal pain, nausea, vomiting, diarrhea. He has bilateral lower extremity edema since knee replacement surgery and this is unchanged from baseline. He is currently on Xarelto for history of DVT. He he does not miss any doses of Xarelto.  Past Medical History:  Diagnosis Date  . Arthritis   . Cancer [facility]) 2010   Prostate  . Chronic kidney disease   . Diabetes mellitus   . Diabetic neuropathy (HCC)    feet  . DVT (deep venous thrombosis) ([place])   . Genetic testing 06/22/2016   Mr. Slemmer underwent genetic counseling and testing for hereditary cancer syndromes on 05/14/2016. His results were negative for mutations in all 46 genes analyzed by [employer]'s 46-gene Common Hereditary Cancers Panel. Genes analyzed include: APC, ATM, AXIN2, BARD1, BMPR1A, BRCA1, BRCA2, BRIP1, CDH1, CDKN2A, CHEK2, CTNNA1, DICER1, EPCAM, GREM1, HOXB13, KIT, MEN1, MLH1, MSH2, MSH3, MSH6, MUTYH, NB  . GERD (gastroesophageal reflux disease)   . Headache   . Hypertension   .  PE (pulmonary thromboembolism) ([place])   . Pneumonia   . Sleep apnea    not wearing CPAP  . Wears glasses     Patient Active Problem List   Diagnosis Date Noted  . Atrial fibrillation with RVR ([place]) 05/31/2017  . Total knee replacement status 11/19/2016  . Meniscal injury 10/09/2016  . Hypercoagulable state ([place]) 09/22/2016  . Genetic testing 06/22/2016  . DVT, lower extremity, proximal, acute, left ([place]) 01/23/2016  . Hematuria 12/12/2015  . Pulmonary embolism ([place]) 11/23/2015  . Type 2 diabetes mellitus with chronic kidney disease, without long-term current use of insulin ([place]) 12/05/2014  . Benign prostatic hyperplasia with urinary obstruction 07/25/2014  . Urine retention 07/25/2014  . Urinary tract infection 07/09/2014  . Anemia, iron deficiency 11/22/2013  . Urinary retention 11/01/2013  . Incomplete emptying of bladder 07/26/2013  . Nocturia 07/26/2013  . Other nonspecific finding on examination of urine 07/26/2013  . Sleep apnea 07/26/2013  . Urethral stricture 07/26/2013  . Malignant neoplasm of prostate ([place]) 07/04/2013  . Bladder neck contracture 07/04/2013  . Severe obesity (BMI >= 40) ([place]) 05/23/2013  . B12 deficiency 05/23/2013  . Hyperlipidemia with target LDL less than 100 09/02/2012  . HTN (hypertension) 05/26/2012  . Osteoarthritis, knee 05/26/2012  . MORBID OBESITY 08/22/2008    Past Surgical History:  Procedure Laterality Date  . COLONOSCOPY    . HERNIA REPAIR    . KNEE SURGERY    . MULTIPLE TOOTH EXTRACTIONS    . PROSTATE SURGERY    . RADIOACTIVE  SEED IMPLANT    . SHOULDER SURGERY    . TOTAL KNEE ARTHROPLASTY Right 11/19/2016   Procedure: RIGHT TOTAL KNEE ARTHROPLASTY;  Surgeon: Leandrew Koyanagi, MD;  Location: [facility];  Service: Orthopedics;  Laterality: Right;  . uretha surgery-2014          Home Medications    Prior to Admission medications   Medication Sig Start Date End Date Taking? Authorizing Provider  AMBULATORY NON FORMULARY MEDICATION  Full Face CPAP Mask with Tubing Dx: G47.30 11/12/16   Reed, Tiffany L, DO  amLODipine (NORVASC) 10 MG tablet Take 1 tablet (10 mg total) by mouth daily. For high blood pressure 05/06/17   [name], NP  aspirin EC 81 MG tablet Take 81 mg by mouth daily.    [provider]  atorvastatin (LIPITOR) 20 MG tablet Take one tablet by mouth once daily for cholesterol 05/06/17   [name], NP  carvedilol (COREG) 6.25 MG tablet Take 1 tablet (6.25 mg total) by mouth 2 (two) times daily with a meal. For high blood pressure 05/06/17   [name], NP  glipiZIDE (GLUCOTROL) 5 MG tablet Take 1 tablet (5 mg total) by mouth daily before breakfast. 05/06/17   [name], NP  hydrALAZINE (APRESOLINE) 25 MG tablet Take 1 tablet (25 mg total) by mouth 3 (three) times daily. 05/06/17   [name], NP  losartan (COZAAR) 100 MG tablet Take 1 tablet (100 mg total) by mouth daily. 12/14/16   [name], NP  metFORMIN (GLUCOPHAGE) 500 MG tablet TAKE 1 TABLET BY MOUTH 2 TIMES A DAY WITH A MEAL for diabetes 01/21/16   Estill Dooms, MD  omeprazole (PRILOSEC) 20 MG capsule Take 1 capsule (20 mg total) by mouth as needed. 12/14/16   [name], NP  potassium chloride SA (K-DUR,KLOR-CON) 20 MEQ tablet Take 1 tablet (20 mEq total) by mouth 2 (two) times daily. 12/14/16   [name], NP  rivaroxaban (XARELTO) 20 MG TABS tablet Take 1 tablet (20 mg total) by mouth daily with supper. 01/23/16   Volanda Napoleon, MD  senna-docusate (SENOKOT S) 8.6-50 MG tablet Take 1 tablet by mouth at bedtime as needed. 11/19/16   Leandrew Koyanagi, MD  traMADol (ULTRAM) 50 MG tablet Take 1 tablet (50 mg total) by mouth every 6 (six) hours as needed. for pain 05/06/17   [name], NP  TRUE METRIX BLOOD GLUCOSE TEST test strip CHECK BLOOD SUGAR TWICE PER DAY 10/24/15   [name], NP  TRUEPLUS LANCETS 30G MISC USE TO TEST BLOOD SUGAR 2 TIMES A DAY 09/12/14   [name],  NP    Family History Family History  Problem Relation Age of Onset  . Breast cancer Mother 76       d.89  . Breast cancer Sister 60       d.30  . Leukemia Brother 18       d.20  . Breast cancer Maternal Aunt 40       d.40s  . Lung cancer Maternal Uncle   . Prostate cancer Paternal Uncle   . Prostate cancer Brother        recurred recently at age 79  . Other Brother 15       spinal tumor  . Cervical cancer Other 22       d.22  . Cancer Sister 52       unspecified type  . Cancer Maternal Uncle  80       unspecified type    Social History Social History   Tobacco Use  . Smoking status: Never Smoker  . Smokeless tobacco: Never Used  Substance Use Topics  . Alcohol use: No    Comment: never  . Drug use: No     Allergies   Lisinopril   Review of Systems Review of Systems  Respiratory: Positive for shortness of breath.   All other systems reviewed and are negative.    Physical Exam Updated Vital Signs BP (!) 142/79   Pulse 95   Temp 97.8 F (36.6 C) (Oral)   Resp 17   Ht '5\' 9"'$  (1.753 m)   Wt (!) 142.9 kg (315 lb)   SpO2 93%   BMI 46.52 kg/m   Physical Exam  Constitutional: He is oriented to person, place, and time. He appears well-developed and well-nourished.  HENT:  Head: Normocephalic and atraumatic.  Cardiovascular:  No murmur heard. Tachycardic and irregular  Pulmonary/Chest: Effort normal and breath sounds normal. No respiratory distress.  Abdominal: Soft. There is no tenderness. There is no rebound and no guarding.  Musculoskeletal: He exhibits no edema or tenderness.  Neurological: He is alert and oriented to person, place, and time.  Skin: Skin is warm and dry.  Psychiatric: He has a normal mood and affect. His behavior is normal.  Nursing note and vitals reviewed.    ED Treatments / Results  Labs (all labs ordered are listed, but only abnormal results are displayed) Labs Reviewed  CBC WITH DIFFERENTIAL/PLATELET - Abnormal;  Notable for the following components:      Result Value   Hemoglobin 12.0 (*)    HCT 37.0 (*)    All other components within normal limits  COMPREHENSIVE METABOLIC PANEL - Abnormal; Notable for the following components:   Potassium 3.3 (*)    Glucose, Bld 164 (*)    Creatinine, Ser 1.26 (*)    Calcium 8.7 (*)    AST 14 (*)    ALT 13 (*)    GFR calc non Af Amer 58 (*)    All other components within normal limits  BRAIN NATRIURETIC PEPTIDE - Abnormal; Notable for the following components:   B Natriuretic Peptide 450.6 (*)    All other components within normal limits  URINALYSIS, ROUTINE W REFLEX MICROSCOPIC - Abnormal; Notable for the following components:   Hgb urine dipstick SMALL (*)    Leukocytes, UA LARGE (*)    All other components within normal limits  URINALYSIS, MICROSCOPIC (REFLEX) - Abnormal; Notable for the following components:   Bacteria, UA MANY (*)    Squamous Epithelial / LPF 0-5 (*)    All other components within normal limits  CBG MONITORING, ED - Abnormal; Notable for the following components:   Glucose-Capillary 178 (*)    All other components within normal limits  URINE CULTURE  TROPONIN I    EKG EKG Interpretation  Date/Time:  Monday May 31 2017 17:44:56 EDT Ventricular Rate:  132 PR Interval:    QRS Duration: 88 QT Interval:  330 QTC Calculation: 488 R Axis:   41 Text Interpretation:  Atrial fibrillation with rapid ventricular response with premature ventricular or aberrantly conducted complexes Cannot rule out Anterior infarct , age undetermined Abnormal ECG Confirmed by Quintella Reichert [phone]) on 05/31/2017 5:48:04 PM   Radiology Dg Chest 2 View  Result Date: 05/31/2017 CLINICAL DATA:  Chest pain with shortness of breath EXAM: CHEST - 2 VIEW COMPARISON:  04/02/2017 FINDINGS:  No pleural effusion or focal airspace disease. Borderline to mild cardiomegaly with vascular congestion and mild pulmonary edema. No pneumothorax. IMPRESSION: Borderline  heart size with vascular congestion and mild pulmonary edema. Electronically Signed   By: Donavan Foil M.D.   On: 05/31/2017 18:48    Procedures Procedures (including critical care time) CRITICAL CARE Performed by: Quintella Reichert   Total critical care time: 45 minutes  Critical care time was exclusive of separately billable procedures and treating other patients.  Critical care was necessary to treat or prevent imminent or life-threatening deterioration.  Critical care was time spent personally by me on the following activities: development of treatment plan with patient and/or surrogate as well as nursing, discussions with consultants, evaluation of patient's response to treatment, examination of patient, obtaining history from patient or surrogate, ordering and performing treatments and interventions, ordering and review of laboratory studies, ordering and review of radiographic studies, pulse oximetry and re-evaluation of patient's condition.  Medications Ordered in ED Medications  diltiazem (CARDIZEM) 1 mg/mL load via infusion 15 mg (15 mg Intravenous Bolus from Bag 05/31/17 1842)    And  diltiazem (CARDIZEM) 100 mg in dextrose 5 % 100 mL (1 mg/mL) infusion (5 mg/hr Intravenous Transfusing/Transfer 06/01/17 0014)  nitroGLYCERIN (NITROSTAT) SL tablet 0.4 mg (has no administration in time range)  morphine 2 MG/ML injection 2 mg (has no administration in time range)  furosemide (LASIX) injection 40 mg (40 mg Intravenous Given 05/31/17 1940)  potassium chloride 20 MEQ/15ML (10%) solution 40 mEq (40 mEq Oral Given 05/31/17 2109)     Initial Impression / Assessment and Plan / ED Course  I have reviewed the triage vital signs and the nursing notes.  Pertinent labs & imaging results that were available during my care of the patient were reviewed by me and considered in my medical decision making (see chart for details).     Patient here for evaluation of chest pain, shortness of breath.  He is in new onset a fib with RVR. Chest x-ray and symptoms concerning for some element of acute CHF as well. He was treated with diltiazem bolus and trip with improvement to his heart rate to 90s to 100. He was also treated with Lasix for diuresis. He is currently on xarelto therapy and compliant with dosing, therefor heparin drip was not initiated.  D/w Cardiologist on call - recommends Hospitalist admission.  D/w Hospitalist, Dr. Blaine Hamper, who accepts the patient in transfer.  Patient and family updated of findings of studies and recommendation for admission for further treatment and they are in agreement with plan.    Final Clinical Impressions(s) / ED Diagnoses   Final diagnoses:  None    ED Discharge Orders    None       Quintella Reichert, MD 06/01/17 [phone]

## 2017-05-31 NOTE — ED Triage Notes (Signed)
Pt c/o  SOB and left sided chest pain " on and off"  x 2 days

## 2017-05-31 NOTE — ED Notes (Signed)
Attempt made for inset a foley catheter after given lasix IV, pt states he usually has to self chath him self to be able to urinate, 3 different attempts made by this RN and Junious Silk, with no success.

## 2017-05-31 NOTE — ED Notes (Signed)
ED Provider at bedside. 

## 2017-05-31 NOTE — Care Management (Signed)
This is a no charge note  Transfer from [facility] per Dr. Ayesha Rumpf  67 year old man with a past medical history of hypertension, hyperlipidemia, diabetes mellitus, OSA, GERD, DVT and PE on Xarelto,  CKD-III, dCHF and prostate cancer, who presents with left-sided intermittent chest pain for 2 days. Patient was found to have new A. fib with RVR. Cardizem drip was started. Patient also has fluid overload, and 40 mg Lasix was given. EDP called Card, Dr. Aundra Dubin, but will still need formal consult if needed.   Patient was found to have negative troponin, BNP 450.6, potassium 3.3, creatinine 1.26, no fever, soft blood pressure, O2 sat 93% on 2 L nasal cannula oxygen. Chest x-ray showed mild pulmonary edema. Patient is admitted to stepdown as inpatient.   Please call manager of [facility] at [phone] when pt arrives to floor   Ivor Costa, MD  [facility] Pager [phone]  If 7PM-7AM, please contact night-coverage [email or web address] Password [email or web address] 05/31/2017, 9:01 PM

## 2017-05-31 NOTE — ED Notes (Signed)
Pt denies any chest pain but reports he's been having dizziness and seen for that but has never had a.fib.

## 2017-05-31 NOTE — ED Notes (Signed)
Pt c/o increased shortness of breath. 2L oxygen applied with nasal cannula. Primary RN, Velna Hatchet made aware.

## 2017-06-01 ENCOUNTER — Inpatient Hospital Stay (HOSPITAL_COMMUNITY): Payer: Medicare HMO

## 2017-06-01 ENCOUNTER — Encounter (HOSPITAL_COMMUNITY): Payer: Self-pay | Admitting: Internal Medicine

## 2017-06-01 ENCOUNTER — Other Ambulatory Visit (HOSPITAL_COMMUNITY): Payer: Medicare HMO

## 2017-06-01 DIAGNOSIS — E785 Hyperlipidemia, unspecified: Secondary | ICD-10-CM

## 2017-06-01 DIAGNOSIS — I824Y2 Acute embolism and thrombosis of unspecified deep veins of left proximal lower extremity: Secondary | ICD-10-CM

## 2017-06-01 DIAGNOSIS — R079 Chest pain, unspecified: Secondary | ICD-10-CM

## 2017-06-01 DIAGNOSIS — N183 Chronic kidney disease, stage 3 unspecified: Secondary | ICD-10-CM | POA: Diagnosis present

## 2017-06-01 DIAGNOSIS — I4891 Unspecified atrial fibrillation: Secondary | ICD-10-CM | POA: Diagnosis present

## 2017-06-01 DIAGNOSIS — I5022 Chronic systolic (congestive) heart failure: Secondary | ICD-10-CM

## 2017-06-01 DIAGNOSIS — I2782 Chronic pulmonary embolism: Secondary | ICD-10-CM

## 2017-06-01 DIAGNOSIS — I129 Hypertensive chronic kidney disease with stage 1 through stage 4 chronic kidney disease, or unspecified chronic kidney disease: Secondary | ICD-10-CM | POA: Diagnosis present

## 2017-06-01 DIAGNOSIS — E1122 Type 2 diabetes mellitus with diabetic chronic kidney disease: Secondary | ICD-10-CM

## 2017-06-01 DIAGNOSIS — I5033 Acute on chronic diastolic (congestive) heart failure: Secondary | ICD-10-CM

## 2017-06-01 HISTORY — DX: Acute on chronic diastolic (congestive) heart failure: I50.33

## 2017-06-01 LAB — TROPONIN I
Troponin I: <0.03 ng/mL (ref <0.03)
Troponin I: <0.03 ng/mL (ref <0.03)
Troponin I: <0.03 ng/mL (ref <0.03)

## 2017-06-01 LAB — T4, FREE: Free T4: 1.23 ng/dL — ABNORMAL HIGH (ref 0.61–1.12)

## 2017-06-01 LAB — D-DIMER, QUANTITATIVE: D-Dimer, Quant: 0.58 ug/mL-FEU — ABNORMAL HIGH (ref 0.00–0.50)

## 2017-06-01 LAB — MAGNESIUM: Magnesium: 1.7 mg/dL (ref 1.7–2.4)

## 2017-06-01 LAB — TSH: TSH: 1.471 u[IU]/mL (ref 0.350–4.500)

## 2017-06-01 LAB — LIPID PANEL
Cholesterol: 127 mg/dL (ref 0–200)
HDL: 44 mg/dL (ref >40)
LDL Cholesterol: 64 mg/dL (ref 0–99)
Total CHOL/HDL Ratio: 2.9 RATIO
Triglycerides: 96 mg/dL (ref <150)
VLDL: 19 mg/dL (ref 0–40)

## 2017-06-01 LAB — GLUCOSE, CAPILLARY
Glucose-Capillary: 212 mg/dL — ABNORMAL HIGH (ref 65–99)
Glucose-Capillary: 222 mg/dL — ABNORMAL HIGH (ref 65–99)
Glucose-Capillary: 243 mg/dL — ABNORMAL HIGH (ref 65–99)
Glucose-Capillary: 175 mg/dL — ABNORMAL HIGH (ref 65–99)

## 2017-06-01 LAB — HEMOGLOBIN A1C
Hgb A1c MFr Bld: 7.1 % — ABNORMAL HIGH (ref 4.8–5.6)
Mean Plasma Glucose: 157.07 mg/dL

## 2017-06-01 LAB — MRSA PCR SCREENING: MRSA by PCR: NEGATIVE

## 2017-06-01 MED ORDER — ACETAMINOPHEN 325 MG PO TABS: 650.0000 mg | ORAL_TABLET | ORAL | Status: DC | PRN

## 2017-06-01 MED ORDER — ATORVASTATIN CALCIUM 20 MG PO TABS
20.0000 mg | ORAL_TABLET | Freq: Every day | ORAL | Status: DC
  Administered 2017-06-01 – 2017-06-02 (×2): 20 mg via ORAL
  Filled 2017-06-01 (×2): qty 1

## 2017-06-01 MED ORDER — SODIUM CHLORIDE 0.9% FLUSH
3.0000 mL | Freq: Two times a day (BID) | INTRAVENOUS | Status: DC
  Administered 2017-06-02: 3 mL via INTRAVENOUS

## 2017-06-01 MED ORDER — HYDRALAZINE HCL 20 MG/ML IJ SOLN: 5.0000 mg | INTRAMUSCULAR | Status: DC | PRN

## 2017-06-01 MED ORDER — INSULIN ASPART 100 UNIT/ML ~~LOC~~ SOLN
0.0000 [IU] | Freq: Three times a day (TID) | SUBCUTANEOUS | Status: DC
  Administered 2017-06-01 – 2017-06-02 (×5): 3 [IU] via SUBCUTANEOUS
  Administered 2017-06-02: 2 [IU] via SUBCUTANEOUS
  Administered 2017-06-03 (×2): 3 [IU] via SUBCUTANEOUS

## 2017-06-01 MED ORDER — ASPIRIN EC 81 MG PO TBEC
81.0000 mg | DELAYED_RELEASE_TABLET | Freq: Every day | ORAL | Status: DC
  Administered 2017-06-01 – 2017-06-03 (×3): 81 mg via ORAL
  Filled 2017-06-01 (×3): qty 1

## 2017-06-01 MED ORDER — AMLODIPINE BESYLATE 10 MG PO TABS
10.0000 mg | ORAL_TABLET | Freq: Every day | ORAL | Status: DC
  Administered 2017-06-01 – 2017-06-03 (×3): 10 mg via ORAL
  Filled 2017-06-01 (×3): qty 1

## 2017-06-01 MED ORDER — RIVAROXABAN 20 MG PO TABS
20.0000 mg | ORAL_TABLET | Freq: Every day | ORAL | Status: DC
  Administered 2017-06-01 – 2017-06-03 (×3): 20 mg via ORAL
  Filled 2017-06-01 (×3): qty 1

## 2017-06-01 MED ORDER — LEVALBUTEROL HCL 1.25 MG/0.5ML IN NEBU: 1.2500 mg | INHALATION_SOLUTION | Freq: Four times a day (QID) | RESPIRATORY_TRACT | Status: DC | PRN

## 2017-06-01 MED ORDER — INSULIN ASPART 100 UNIT/ML ~~LOC~~ SOLN
0.0000 [IU] | Freq: Every day | SUBCUTANEOUS | Status: DC
  Administered 2017-06-02: 2 [IU] via SUBCUTANEOUS

## 2017-06-01 MED ORDER — ONDANSETRON HCL 4 MG/2ML IJ SOLN: 4.0000 mg | Freq: Four times a day (QID) | INTRAMUSCULAR | Status: DC | PRN

## 2017-06-01 MED ORDER — LOSARTAN POTASSIUM 50 MG PO TABS
100.0000 mg | ORAL_TABLET | Freq: Every day | ORAL | Status: DC
  Administered 2017-06-01 – 2017-06-03 (×3): 100 mg via ORAL
  Filled 2017-06-01 (×3): qty 2

## 2017-06-01 MED ORDER — SODIUM CHLORIDE 0.9% FLUSH: 3.0000 mL | INTRAVENOUS | Status: DC | PRN

## 2017-06-01 MED ORDER — HYDRALAZINE HCL 25 MG PO TABS
25.0000 mg | ORAL_TABLET | Freq: Three times a day (TID) | ORAL | Status: DC
  Administered 2017-06-01 – 2017-06-03 (×7): 25 mg via ORAL
  Filled 2017-06-01 (×7): qty 1

## 2017-06-01 MED ORDER — SENNOSIDES-DOCUSATE SODIUM 8.6-50 MG PO TABS: 1.0000 | ORAL_TABLET | Freq: Every evening | ORAL | Status: DC | PRN

## 2017-06-01 MED ORDER — DILTIAZEM HCL-DEXTROSE 100-5 MG/100ML-% IV SOLN (PREMIX)
5.0000 mg/h | INTRAVENOUS | Status: DC
  Administered 2017-06-01 – 2017-06-02 (×3): 12.5 mg/h via INTRAVENOUS
  Administered 2017-06-02: 5 mg/h via INTRAVENOUS
  Filled 2017-06-01 (×8): qty 100

## 2017-06-01 MED ORDER — TRAMADOL HCL 50 MG PO TABS: 50.0000 mg | ORAL_TABLET | Freq: Four times a day (QID) | ORAL | Status: DC | PRN

## 2017-06-01 MED ORDER — PANTOPRAZOLE SODIUM 40 MG PO TBEC
40.0000 mg | DELAYED_RELEASE_TABLET | Freq: Every day | ORAL | Status: DC
  Administered 2017-06-01 – 2017-06-03 (×3): 40 mg via ORAL
  Filled 2017-06-01 (×3): qty 1

## 2017-06-01 MED ORDER — SODIUM CHLORIDE 0.9 % IV SOLN
250.0000 mL | INTRAVENOUS | Status: DC
  Administered 2017-06-03: 10:00:00 via INTRAVENOUS

## 2017-06-01 MED ORDER — CARVEDILOL 6.25 MG PO TABS
6.2500 mg | ORAL_TABLET | Freq: Two times a day (BID) | ORAL | Status: DC
  Administered 2017-06-01 – 2017-06-02 (×3): 6.25 mg via ORAL
  Filled 2017-06-01 (×3): qty 1

## 2017-06-01 MED ORDER — FUROSEMIDE 10 MG/ML IJ SOLN
40.0000 mg | Freq: Every day | INTRAMUSCULAR | Status: DC
  Administered 2017-06-02: 40 mg via INTRAVENOUS
  Filled 2017-06-01: qty 4

## 2017-06-01 MED ORDER — LEVALBUTEROL HCL 1.25 MG/0.5ML IN NEBU
1.2500 mg | INHALATION_SOLUTION | Freq: Four times a day (QID) | RESPIRATORY_TRACT | Status: DC
  Administered 2017-06-01 (×2): 1.25 mg via RESPIRATORY_TRACT
  Filled 2017-06-01 (×2): qty 0.5

## 2017-06-01 MED ORDER — ZOLPIDEM TARTRATE 5 MG PO TABS: 5.0000 mg | ORAL_TABLET | Freq: Every evening | ORAL | Status: DC | PRN

## 2017-06-01 MED ORDER — DM-GUAIFENESIN ER 30-600 MG PO TB12: 1.0000 | ORAL_TABLET | Freq: Two times a day (BID) | ORAL | Status: DC | PRN

## 2017-06-01 NOTE — Consult Note (Addendum)
Cardiology Consultation:   Patient ID: Thomas [name]; [id number]; 1950-08-11   Admit date: 05/31/2017 Date of Consult: 06/01/2017  Primary Care Provider: [name], NP Primary Cardiologist: Larae Grooms, MD Primary Electrophysiologist:  None   Patient Profile:   Thomas [name] is a 67 y.o. male with a PMH of chronic diastolic CHF, HTN, HLD, DM type 2, CKD stage III, History of PE/DVT on xarelto, OSA, morbid obesity, GERD, and prostate cancer s/p seed implantation who is being seen today for the evaluation of chest pain at the request of Dr. Posey Pronto.  History of Present Illness:   [name] was in his usual state of health until 2 days ago when he began experiencing worsening DOE. He noted SOB occurred with less exertion than usual. He denied SOB at rest but did notice some wheezing. He also has been experiencing intermittent left sided lower chest tightness which occurs at rest and last for <5 seconds at a time. He denies exacerbation of chest tightness with exertion or deep breathing, associated diaphoresis, N/V, lightheadedness, or syncope. He has baseline vertigo which has not changed recently. The chest tightness he describes is similar to what he has experienced in the past and thought to be atypical CP. He denies feeling his heart race, palpitations, orthopnea, PND, or LE edema.  Patient was last seen by Dr. Irish Lack 04/2017 with complaints of intermittent non-exertional left-sided chest pain with emotional stress at that time. He was seen in follow-up of recent ED visit 03/2017 for atypical chest pain where he underwent a CT chest which was negative for PE, however noted coronary artery calcifications (LAD and LCx). His last LHC occurred in 2008 and revealed no significant CAD. He has not had an ischemic evaluation since that time. He was instructed to continue aggressive risk factor modifications and to notify cardiology if he develops more significant cardiac  symptoms.   He currently feels his breathing has improved since arrival to the ED. He has not experienced any more chest tightness today but reports a soreness in his left-lower chest wall.   Hospital course: generally hypertensive with 2 episodes of hypotension (BP 93/75), intermittently tachycardic and tachypneic. Labs notable for K 3.3 (s/p repletion 40 mEq), Cr 1.26 (baseline 1.1), Hgb 12, PLT 275, Trop <0.03 x2, BNP 450, TSH wnl, FT4 1.23, HgbA1c 7.1, LDL 64, Ddimer 0.58^. EKG with atrial fibrillation with RVR rate 132, no STE/D. CXR with mild pulmonary edema. Echo and VQ scan pending. Patient was started on a diltiazem gtt for rate control of new onset Afib with RVR, and given IV lasix '40mg'$  for acute on chronic dCHF. Cardiology asked to evaluate for further recommendations.   Past Medical History:  Diagnosis Date  . Acute on chronic diastolic CHF (congestive heart failure) ([place]) 06/01/2017  . Arthritis   . Cancer [facility]) 2010   Prostate  . Chronic kidney disease   . Diabetes mellitus   . Diabetic neuropathy (HCC)    feet  . DVT (deep venous thrombosis) ([place])   . Genetic testing 06/22/2016   Mr. Xia underwent genetic counseling and testing for hereditary cancer syndromes on 05/14/2016. His results were negative for mutations in all 46 genes analyzed by [employer]'s 46-gene Common Hereditary Cancers Panel. Genes analyzed include: APC, ATM, AXIN2, BARD1, BMPR1A, BRCA1, BRCA2, BRIP1, CDH1, CDKN2A, CHEK2, CTNNA1, DICER1, EPCAM, GREM1, HOXB13, KIT, MEN1, MLH1, MSH2, MSH3, MSH6, MUTYH, NB  . GERD (gastroesophageal reflux disease)   . Headache   . Hypertension   .  PE (pulmonary thromboembolism) ([place])   . Pneumonia   . Sleep apnea    not wearing CPAP  . Wears glasses     Past Surgical History:  Procedure Laterality Date  . COLONOSCOPY    . HERNIA REPAIR    . KNEE SURGERY    . MULTIPLE TOOTH EXTRACTIONS    . PROSTATE SURGERY    . RADIOACTIVE SEED IMPLANT    . SHOULDER SURGERY    .  TOTAL KNEE ARTHROPLASTY Right 11/19/2016   Procedure: RIGHT TOTAL KNEE ARTHROPLASTY;  Surgeon: Leandrew Koyanagi, MD;  Location: [facility];  Service: Orthopedics;  Laterality: Right;  . uretha surgery-2014       Home Medications:  Prior to Admission medications   Medication Sig Start Date End Date Taking? Authorizing Provider  AMBULATORY NON FORMULARY MEDICATION Full Face CPAP Mask with Tubing Dx: G47.30 11/12/16   Reed, Tiffany L, DO  amLODipine (NORVASC) 10 MG tablet Take 1 tablet (10 mg total) by mouth daily. For high blood pressure 05/06/17   [name], NP  aspirin EC 81 MG tablet Take 81 mg by mouth daily.    [provider]  atorvastatin (LIPITOR) 20 MG tablet Take one tablet by mouth once daily for cholesterol 05/06/17   [name], NP  carvedilol (COREG) 6.25 MG tablet Take 1 tablet (6.25 mg total) by mouth 2 (two) times daily with a meal. For high blood pressure 05/06/17   [name], NP  glipiZIDE (GLUCOTROL) 5 MG tablet Take 1 tablet (5 mg total) by mouth daily before breakfast. 05/06/17   [name], NP  hydrALAZINE (APRESOLINE) 25 MG tablet Take 1 tablet (25 mg total) by mouth 3 (three) times daily. 05/06/17   [name], NP  losartan (COZAAR) 100 MG tablet Take 1 tablet (100 mg total) by mouth daily. 12/14/16   [name], NP  metFORMIN (GLUCOPHAGE) 500 MG tablet TAKE 1 TABLET BY MOUTH 2 TIMES A DAY WITH A MEAL for diabetes 01/21/16   Estill Dooms, MD  omeprazole (PRILOSEC) 20 MG capsule Take 1 capsule (20 mg total) by mouth as needed. 12/14/16   [name], NP  potassium chloride SA (K-DUR,KLOR-CON) 20 MEQ tablet Take 1 tablet (20 mEq total) by mouth 2 (two) times daily. 12/14/16   [name], NP  rivaroxaban (XARELTO) 20 MG TABS tablet Take 1 tablet (20 mg total) by mouth daily with supper. 01/23/16   Volanda Napoleon, MD  senna-docusate (SENOKOT S) 8.6-50 MG tablet Take 1 tablet by mouth at bedtime as needed. 11/19/16    Leandrew Koyanagi, MD  traMADol (ULTRAM) 50 MG tablet Take 1 tablet (50 mg total) by mouth every 6 (six) hours as needed. for pain 05/06/17   [name], NP  TRUE METRIX BLOOD GLUCOSE TEST test strip CHECK BLOOD SUGAR TWICE PER DAY 10/24/15   [name], NP  TRUEPLUS LANCETS 30G MISC USE TO TEST BLOOD SUGAR 2 TIMES A DAY 09/12/14   [name], NP    Inpatient Medications: Scheduled Meds: . amLODipine  10 mg Oral Daily  . aspirin EC  81 mg Oral Daily  . atorvastatin  20 mg Oral q1800  . carvedilol  6.25 mg Oral BID WC  . [START ON 06/02/2017] furosemide  40 mg Intravenous Daily  . hydrALAZINE  25 mg Oral TID  . insulin aspart  0-5 Units Subcutaneous QHS  . insulin aspart  0-9 Units Subcutaneous TID WC  .  levalbuterol  1.25 mg Nebulization Q6H  . losartan  100 mg Oral Daily  . pantoprazole  40 mg Oral Daily  . rivaroxaban  20 mg Oral Q supper   Continuous Infusions: . diltiazem (CARDIZEM) infusion 10 mg/hr (06/01/17 0720)   PRN Meds: acetaminophen, dextromethorphan-guaiFENesin, hydrALAZINE, morphine injection, nitroGLYCERIN, ondansetron (ZOFRAN) IV, senna-docusate, traMADol, zolpidem  Allergies:    Allergies  Allergen Reactions  . Lisinopril Cough    Social History:   Social History   Socioeconomic History  . Marital status: Married    Spouse name: Not on file  . Number of children: Not on file  . Years of education: Not on file  . Highest education level: Not on file  Occupational History  . Not on file  Social Needs  . Financial resource strain: Not on file  . Food insecurity:    Worry: Not on file    Inability: Not on file  . Transportation needs:    Medical: Not on file    Non-medical: Not on file  Tobacco Use  . Smoking status: Never Smoker  . Smokeless tobacco: Never Used  Substance and Sexual Activity  . Alcohol use: No    Comment: never  . Drug use: No  . Sexual activity: Yes  Lifestyle  . Physical activity:    Days per week: Not on  file    Minutes per session: Not on file  . Stress: Not on file  Relationships  . Social connections:    Talks on phone: Not on file    Gets together: Not on file    Attends religious service: Not on file    Active member of club or organization: Not on file    Attends meetings of clubs or organizations: Not on file    Relationship status: Not on file  . Intimate partner violence:    Fear of current or ex partner: Not on file    Emotionally abused: Not on file    Physically abused: Not on file    Forced sexual activity: Not on file  Other Topics Concern  . Not on file  Social History Narrative  . Not on file    Family History:    Family History  Problem Relation Age of Onset  . Breast cancer Mother 24       d.89  . Breast cancer Sister 70       d.30  . Leukemia Brother 18       d.20  . Breast cancer Maternal Aunt 40       d.40s  . Lung cancer Maternal Uncle   . Prostate cancer Paternal Uncle   . Prostate cancer Brother        recurred recently at age 37  . Other Brother 15       spinal tumor  . Cervical cancer Other 22       d.22  . Cancer Sister 6       unspecified type  . Cancer Maternal Uncle 80       unspecified type     ROS:  Please see the history of present illness.   All other ROS reviewed and negative.     Physical Exam/Data:   Vitals:   06/01/17 0130 06/01/17 0200 06/01/17 0402 06/01/17 0754  BP: (!) 151/102 (!) 134/107 (!) 150/101   Pulse: 75  (!) 122 (!) (P) 113  Resp:  20 (!) 24 (!) (P) 23  Temp:   98.7 F (37.1 C)  TempSrc:   Oral (P) Oral  SpO2:   95%   Weight:      Height:        Intake/Output Summary (Last 24 hours) at 06/01/2017 0759 Last data filed at 06/01/2017 0624 Gross per 24 hour  Intake 81.01 ml  Output 2050 ml  Net -1968.99 ml   Filed Weights   05/31/17 1743 06/01/17 0121  Weight: (!) 315 lb (142.9 kg) (!) 316 lb (143.3 kg)   Body mass index is 46.67 kg/m.  General:  Morbidly obese AAM laying in bed in no acute  distress HEENT: sclera anicteric, MMM  Neck: no JVD appreciated Vascular: No carotid bruits; distal pulses 2+ bilaterally Cardiac:  normal S1, S2; IRRR; no murmurs, gallops, or rubs; +L lateral/lower chest wall TTP Lungs:  Decreased breath sounds at bases b/l; otherwise clear to auscultation bilaterally, no wheezing, rhonchi or rales  Abd: NABS, soft, nontender, no hepatomegaly Ext: trace LE edema Musculoskeletal:  No deformities, BUE and BLE strength normal and equal Skin: warm and dry  Neuro:  CNs 2-12 intact, no focal abnormalities noted Psych:  Normal affect   EKG:  The EKG was personally reviewed and demonstrates:  Atrial fibrillation with RVR, rate 132, PVC, no STE/D Telemetry:  Telemetry was personally reviewed and demonstrates:  Atrial fibrillation with rate consistently >100; occasional PAC/PVC  Relevant CV Studies:  Echocardiogram 11/2015: Study Conclusions  - Left ventricle: The cavity size was normal. There was moderate   concentric hypertrophy. Systolic function was normal. The   estimated ejection fraction was in the range of 55% to 60%. Wall   motion was normal; there were no regional wall motion   abnormalities. Features are consistent with a pseudonormal left   ventricular filling pattern, with concomitant abnormal relaxation   and increased filling pressure (grade 2 diastolic dysfunction). - Aortic valve: There was trivial regurgitation. - Left atrium: The atrium was moderately dilated. - Right atrium: The atrium was mildly to moderately dilated.  Cardiac catheterization 2008:  FINDINGS:  The left main was large vessel and widely patent.  The  circumflex was a large dominant vessel which was angiographically  normal.  There is an OM1 which is medium-sized with mild irregularities.  There is a second obtuse marginal which is a large vessel which is  angiographically normal.  The PDA did arise from the left circumflex  that supplied blood flow to the apex.  The  left anterior descending was  a large vessel with minor luminal irregularities.  The first diagonal is  a large branching vessel with minor irregularities.  The right coronary  artery is a nondominant vessel and is widely patent.   The left ventriculogram shows normal left ventricular function with an  estimated ejection fraction of 55%.   The abdominal aortogram shows bilateral single renal arteries.  There  did not appear to be any hemodynamically significant stenoses although  the right renal artery is somewhat unclear.  There is no abdominal  aortic aneurysm.   HEMODYNAMIC RESULTS:  Left ventricular pressure 158/61, LVEDP of 22  mmHg.  Aortic pressure of 159/90 with a mean aortic pressure of 117  mmHg.   IMPRESSION:  1. No significant coronary artery disease.  2. Normal ventricular function with ejection fraction estimated to be      at 55%.  3. No renal artery stenosis.  4. Mildly elevated left ventricular end-diastolic pressure.   RECOMMENDATIONS:  The patient continue to with aggressive preventive  therapy.  He will be  discharged home with follow-up with the office to  relieve his noncardiac pain.  No further cardiac workup is required  prior to his gastric bypass surgery.  Laboratory Data:  Chemistry Recent Labs  Lab 05/31/17 1758  NA 138  K 3.3*  CL 106  CO2 22  GLUCOSE 164*  BUN 20  CREATININE 1.26*  CALCIUM 8.7*  GFRNONAA 58*  GFRAA >60  ANIONGAP 10    Recent Labs  Lab 05/31/17 1758  PROT 7.6  ALBUMIN 3.6  AST 14*  ALT 13*  ALKPHOS 67  BILITOT 0.7   Hematology Recent Labs  Lab 05/31/17 1758  WBC 8.0  RBC 4.39  HGB 12.0*  HCT 37.0*  MCV 84.3  MCH 27.3  MCHC 32.4  RDW 15.0  PLT 275   Cardiac Enzymes Recent Labs  Lab 05/31/17 1758 06/01/17 0236  TROPONINI <0.03 <0.03   No results for input(s): TROPIPOC in the last 168 hours.  BNP Recent Labs  Lab 05/31/17 1758  BNP 450.6*    DDimer  Recent Labs  Lab 06/01/17 0236    DDIMER 0.58*    Radiology/Studies:  Dg Chest 2 View  Result Date: 05/31/2017 CLINICAL DATA:  Chest pain with shortness of breath EXAM: CHEST - 2 VIEW COMPARISON:  04/02/2017 FINDINGS: No pleural effusion or focal airspace disease. Borderline to mild cardiomegaly with vascular congestion and mild pulmonary edema. No pneumothorax. IMPRESSION: Borderline heart size with vascular congestion and mild pulmonary edema. Electronically Signed   By: Donavan Foil M.D.   On: 05/31/2017 18:48    Assessment and Plan:   1. Chest pain: patient reports atypical CP which occurs at rest and is reproducible on exam today. Trop neg x2. Ddimer 0.58^. EKG with Afib RVR, no ischemic changes. CXR with mild pulmonary edema. CT Chest 03/2017 with coronary artery arteriosclerosis noted. Last ischemic evaluation was a LHC 2008 with no significant CAD. Risk factors for ACS include HTN, HLD, DM, obesity, and OSA. Possible chest pain is 2/2 new onset Afib RVR with acute on chronic dCHF component. Also with history of DVT/PE - less likely pt has new PE given compliance with xarelto. - VQ scan pending - Echo pending to assess LV function and wall motion abnormalities - Given atypical nature, can likely pursue outpatient ischemic evaluation with NST  2. New onset atrial fibrillation with RVR: still with HR consistently >100. TSH wnl; FT4 mildly elevated. Suspect Afib could be 2/2 vs contributing to acute on chronic diastolic CHF and possible contributed to CP. He reports compliance with xarelto for DVT/PE.  - Continue diltiazem gtt until HR consistently <100 - will plan to convert to po once stable  - This patients CHA2DS2-VASc Score and unadjusted Ischemic Stroke Rate (% per year) is equal to 4.8 % stroke rate/year from a score of 4 Above score calculated as 1 point each if present [CHF, HTN, DM, Vascular=MI/PAD/Aortic Plaque, Age if 65-74, or Male] Above score calculated as 2 points each if present [Age > 75, or  Stroke/TIA/TE] - Continue xarelto for now - Can consider cardioversion given compliance with anticoagulation - will plan for tomorrow.   3. Acute on chronic diastolic CHF: p/w chest pain and some SOB, as well as weight gain. LE edema on exam. CXR with mild pulmonary edema. BNP 450. Last Echo 11/2015 with EF 55-60% and G2DD. Started on IV lasix '40mg'$  daily with good UOP, net -1.9L.  - Continue IV diuresis for now - can likely transition to po in the next 24-48  hours.  - Continue to monitor daily weights and strict I&Os. - Echo pending  4. HTN: hypertensive this morning. Suspect this is due to missing medications yesterday while in the ED. - Continue home regimen - Monitor BP closely - if continues to be elevate, may need to adjust doses   5. HLD: LDL 80 04/2017; goal <70 - Continue statin  6. DM type 2: A1C 7.1; almost to goal of <7 - Continue management per primary team  7. History of PE/ LLE DVT: reports compliance with xarelto - Continue xarelto - if VQ scan c/f PE, will need to consider alternative AC for failed xarelto.   8. CKD stage III: Cr 1.26 today; not far off from baseline 1.1 - Continue to monitor closely with diuresis  9. OSA: compliant with CPAP at home - Continue CPAP inpatient   For questions or updates, please contact [place] Please consult [email or web address] for contact info under Cardiology/STEMI.   Signed, Abigail Butts, PA-C  06/01/2017 7:59 AM [phone]  History and all data above reviewed.  Patient examined.  Patient with chest pain that is very atypical.  Right sided 4/10.  Dull never had this before.  At rest and not relieved or aggravated by activity.  No associated symptoms.  He is found to have atria fib with rapid rate.  He checks a pulse ox at home and has not noted his HR to be elevated on this recently.  He does not feel his atrial fib.  He has had some dizziness for weeks.  No presyncope or syncope.  No new SOB/PND or orthopnea.   I agree  with the findings as above.  The patient exam reveals GDJ:MEQASTMHD, no murmurs  ,  Lungs: Clear  ,  Abd: Positive bowel sounds, no rebound no guarding, Ext no edema  .  All available labs, radiology testing, previous records reviewed. Agree with documented assessment and plan. Atrial fib:  This is new. He has been on anticoagulation for PE/DVT.  I will plan DCCV tomorrow and I discussed this with the patient.  For now rate control with Dilt but the ultimate therapy is cardioversion.  Chest pain:  Plan out patient stress test.     Martin Smeal  10:20 AM  06/01/2017

## 2017-06-01 NOTE — Progress Notes (Signed)
   Patient is scheduled for DCCV on Thursday 4/18 with Dr. Marlou Porch which was the earliest available time slot. Will keep NPO after MN tomorrow night. Continue anticoagulation with xarelto and avoid interruptions in dosing.  Roby Lofts, PA-C

## 2017-06-01 NOTE — Progress Notes (Signed)
[facility] PLAN OF CARE NOTE Patient: Thomas [name] FBP:[id number]   PCP: [name], NP DOB: [birth year]   DOA: 05/31/2017   DOS: 06/01/2017    Patient was admitted by my colleague Dr. Blaine Hamper earlier on 06/01/2017. I have reviewed the H&P as well as assessment and plan and agree with the same. Important changes in the plan are listed below.  Plan of care: Principal Problem:   Atrial fibrillation with RVR (HCC) Active Problems:   HTN (hypertension)   Hyperlipidemia with target LDL less than 100   Severe obesity (BMI >= 40) (HCC)   Type 2 diabetes mellitus with chronic kidney disease, without long-term current use of insulin (HCC)   Pulmonary embolism (HCC)   Sleep apnea   DVT, lower extremity, proximal, acute, left (HCC)   Chest pain   New onset atrial fibrillation (HCC)   CKD (chronic kidney disease), stage III (HCC)   Acute on chronic diastolic CHF (congestive heart failure) ([place])    Author: Berle Mull, MD [facility] Pager: [phone] 06/01/2017 2:06 PM   If 7PM-7AM, please contact night-coverage at [email or web address], password [facility]

## 2017-06-01 NOTE — Plan of Care (Signed)
  Problem: Activity: Goal: Risk for activity intolerance will decrease Outcome: Progressing  Pt ambulated in room with steady gait with standby assistance

## 2017-06-01 NOTE — Progress Notes (Signed)
Patient refused CPAP for the night  

## 2017-06-01 NOTE — H&P (Signed)
History and Physical    Thomas [name] DOB: 11/17/1950 DOA: 05/31/2017  Referring MD/NP/PA:   PCP: [name], NP   Patient coming from:  The patient is coming from home.  At baseline, pt is independent for most of ADL.   Chief Complaint: chest pain  HPI: Thomas [name] is a 67 y.o. male with medical history significant of hypertension, hyperlipidemia, diabetes mellitus, OSA, GERD, DVT and PE on Xarelto,  CKD-III, dCHF and prostate cancer, who presents with left-sided intermittent chest pain for 2 days.   Pt state that he has been having intermittent chest pain in the past 2 days. His chest is located in the left side of chest, intermittent, moderate, nonradiating. It is associated with SOB, which is exertional. Patient has cough with little clear or colored mucus production. No recent long distant traveling. Patient states that he is taking xarelto consistently, did not miss any doses recently. Patient states that he has gained 7 pounds in the past 3 months. Denies nausea, vomiting, diarrhea, abdominal pain, symptoms of UTI or unilateral weakness.  ED Course: pt was found to have new A. fib with RVR, negative troponin, BNP 450.6, potassium 3.3, creatinine 1.26, no fever, soft blood pressure, O2 sat 93% on 2 L nasal cannula oxygen. Chest x-ray showed mild pulmonary edema. Patient is admitted to stepdown as inpatient. EDP called Card, Dr. Aundra Dubin, but will still need formal consult if needed.   Review of Systems:   General: no fevers, chills, has body weight gain, has poor appetite, has fatigue HEENT: no blurry vision, hearing changes or sore throat Respiratory: has dyspnea, coughing, no wheezing CV: has chest pain, no palpitations GI: no nausea, vomiting, abdominal pain, diarrhea, constipation GU: no dysuria, burning on urination, increased urinary frequency, hematuria  Ext: has leg edema Neuro: no unilateral weakness, numbness, or tingling, no vision  change or hearing loss Skin: no rash, no skin tear. MSK: No muscle spasm, no deformity, no limitation of range of movement in spin Heme: No easy bruising.  Travel history: No recent long distant travel.  Allergy:  Allergies  Allergen Reactions  . Lisinopril Cough    Past Medical History:  Diagnosis Date  . Acute on chronic diastolic CHF (congestive heart failure) ([place]) 06/01/2017  . Arthritis   . Cancer [facility]) 2010   Prostate  . Chronic kidney disease   . Diabetes mellitus   . Diabetic neuropathy (HCC)    feet  . DVT (deep venous thrombosis) ([place])   . Genetic testing 06/22/2016   Mr. Trentman underwent genetic counseling and testing for hereditary cancer syndromes on 05/14/2016. His results were negative for mutations in all 46 genes analyzed by [facility]'s 46-gene Common Hereditary Cancers Panel. Genes analyzed include: APC, ATM, AXIN2, BARD1, BMPR1A, BRCA1, BRCA2, BRIP1, CDH1, CDKN2A, CHEK2, CTNNA1, DICER1, EPCAM, GREM1, HOXB13, KIT, MEN1, MLH1, MSH2, MSH3, MSH6, MUTYH, NB  . GERD (gastroesophageal reflux disease)   . Headache   . Hypertension   . PE (pulmonary thromboembolism) ([place])   . Pneumonia   . Sleep apnea    not wearing CPAP  . Wears glasses     Past Surgical History:  Procedure Laterality Date  . COLONOSCOPY    . HERNIA REPAIR    . KNEE SURGERY    . MULTIPLE TOOTH EXTRACTIONS    . PROSTATE SURGERY    . RADIOACTIVE SEED IMPLANT    . SHOULDER SURGERY    . TOTAL KNEE ARTHROPLASTY Right 11/19/2016   Procedure: RIGHT TOTAL  KNEE ARTHROPLASTY;  Surgeon: Leandrew Koyanagi, MD;  Location: [facility];  Service: Orthopedics;  Laterality: Right;  . uretha surgery-2014      Social History:  reports that he has never smoked. He has never used smokeless tobacco. He reports that he does not drink alcohol or use drugs.  Family History:  Family History  Problem Relation Age of Onset  . Breast cancer Mother 11       d.89  . Breast cancer Sister 68       d.30  . Leukemia Brother 18         d.20  . Breast cancer Maternal Aunt 40       d.40s  . Lung cancer Maternal Uncle   . Prostate cancer Paternal Uncle   . Prostate cancer Brother        recurred recently at age 57  . Other Brother 15       spinal tumor  . Cervical cancer Other 22       d.22  . Cancer Sister 23       unspecified type  . Cancer Maternal Uncle 80       unspecified type     Prior to Admission medications   Medication Sig Start Date End Date Taking? Authorizing Provider  AMBULATORY NON FORMULARY MEDICATION Full Face CPAP Mask with Tubing Dx: G47.30 11/12/16   Reed, Tiffany L, DO  amLODipine (NORVASC) 10 MG tablet Take 1 tablet (10 mg total) by mouth daily. For high blood pressure 05/06/17   [name], NP  aspirin EC 81 MG tablet Take 81 mg by mouth daily.    [provider]  atorvastatin (LIPITOR) 20 MG tablet Take one tablet by mouth once daily for cholesterol 05/06/17   [name], NP  carvedilol (COREG) 6.25 MG tablet Take 1 tablet (6.25 mg total) by mouth 2 (two) times daily with a meal. For high blood pressure 05/06/17   [name], NP  glipiZIDE (GLUCOTROL) 5 MG tablet Take 1 tablet (5 mg total) by mouth daily before breakfast. 05/06/17   [name], NP  hydrALAZINE (APRESOLINE) 25 MG tablet Take 1 tablet (25 mg total) by mouth 3 (three) times daily. 05/06/17   [name], NP  losartan (COZAAR) 100 MG tablet Take 1 tablet (100 mg total) by mouth daily. 12/14/16   [name], NP  metFORMIN (GLUCOPHAGE) 500 MG tablet TAKE 1 TABLET BY MOUTH 2 TIMES A DAY WITH A MEAL for diabetes 01/21/16   Estill Dooms, MD  omeprazole (PRILOSEC) 20 MG capsule Take 1 capsule (20 mg total) by mouth as needed. 12/14/16   [name], NP  potassium chloride SA (K-DUR,KLOR-CON) 20 MEQ tablet Take 1 tablet (20 mEq total) by mouth 2 (two) times daily. 12/14/16   [name], NP  rivaroxaban (XARELTO) 20 MG TABS tablet Take 1 tablet (20 mg total) by mouth  daily with supper. 01/23/16   Volanda Napoleon, MD  senna-docusate (SENOKOT S) 8.6-50 MG tablet Take 1 tablet by mouth at bedtime as needed. 11/19/16   Leandrew Koyanagi, MD  traMADol (ULTRAM) 50 MG tablet Take 1 tablet (50 mg total) by mouth every 6 (six) hours as needed. for pain 05/06/17   [name], NP  TRUE METRIX BLOOD GLUCOSE TEST test strip CHECK BLOOD SUGAR TWICE PER DAY 10/24/15   [name], NP  TRUEPLUS LANCETS 30G MISC USE TO TEST BLOOD SUGAR 2 TIMES A  DAY 09/12/14   [name], NP    Physical Exam: Vitals:   05/31/17 2345 06/01/17 0000 06/01/17 0121 06/01/17 0130  BP: (!) 161/92 (!) 142/79 (!) 155/130 (!) 151/102  Pulse: (!) 55 95 67 75  Resp: '17 17 19   '$ Temp:   97.8 F (36.6 C)   TempSrc:   Oral   SpO2: 96% 93% 96%   Weight:   (!) 143.3 kg (316 lb)   Height:   '5\' 9"'$  (1.753 m)    General: Not in acute distress HEENT:       Eyes: PERRL, EOMI, no scleral icterus.       ENT: No discharge from the ears and nose, no pharynx injection, no tonsillar enlargement.        Neck: difficult to asscess JVD, no bruit, no mass felt. Heme: No neck lymph node enlargement. Cardiac: S1/S2, irregularly irregular rhythm, No murmurs, No gallops or rubs. Respiratory: No rales, wheezing, rhonchi or rubs. GI: Soft, nondistended, nontender, no rebound pain, no organomegaly, BS present. GU: No hematuria Ext: 1+ leg edema bilaterally. 2+DP/PT pulse bilaterally. Musculoskeletal: No joint deformities, No joint redness or warmth, no limitation of ROM in spin. Skin: No rashes.  Neuro: Alert, oriented X3, cranial nerves II-XII grossly intact, moves all extremities normally. Psych: Patient is not psychotic, no suicidal or hemocidal ideation.  Labs on Admission: I have personally reviewed following labs and imaging studies  CBC: Recent Labs  Lab 05/31/17 1758  WBC 8.0  NEUTROABS 5.8  HGB 12.0*  HCT 37.0*  MCV 84.3  PLT 259   Basic Metabolic Panel: Recent Labs  Lab  05/31/17 1758  NA 138  K 3.3*  CL 106  CO2 22  GLUCOSE 164*  BUN 20  CREATININE 1.26*  CALCIUM 8.7*   GFR: Estimated Creatinine Clearance: 81.3 mL/min (A) (by C-G formula based on SCr of 1.26 mg/dL (H)). Liver Function Tests: Recent Labs  Lab 05/31/17 1758  AST 14*  ALT 13*  ALKPHOS 67  BILITOT 0.7  PROT 7.6  ALBUMIN 3.6   No results for input(s): LIPASE, AMYLASE in the last 168 hours. No results for input(s): AMMONIA in the last 168 hours. Coagulation Profile: No results for input(s): INR, PROTIME in the last 168 hours. Cardiac Enzymes: Recent Labs  Lab 05/31/17 1758  TROPONINI <0.03   BNP (last 3 results) No results for input(s): PROBNP in the last 8760 hours. HbA1C: No results for input(s): HGBA1C in the last 72 hours. CBG: Recent Labs  Lab 05/31/17 2340  GLUCAP 178*   Lipid Profile: No results for input(s): CHOL, HDL, LDLCALC, TRIG, CHOLHDL, LDLDIRECT in the last 72 hours. Thyroid Function Tests: No results for input(s): TSH, T4TOTAL, FREET4, T3FREE, THYROIDAB in the last 72 hours. Anemia Panel: No results for input(s): VITAMINB12, FOLATE, FERRITIN, TIBC, IRON, RETICCTPCT in the last 72 hours. Urine analysis:    Component Value Date/Time   COLORURINE YELLOW 05/31/2017 2113   APPEARANCEUR CLEAR 05/31/2017 2113   LABSPEC 1.020 05/31/2017 2113   PHURINE 6.0 05/31/2017 2113   GLUCOSEU NEGATIVE 05/31/2017 2113   HGBUR SMALL (A) 05/31/2017 2113   BILIRUBINUR NEGATIVE 05/31/2017 2113   BILIRUBINUR small 10/11/2014 1152   KETONESUR NEGATIVE 05/31/2017 2113   PROTEINUR NEGATIVE 05/31/2017 2113   UROBILINOGEN 0.2 10/11/2014 1152   UROBILINOGEN 0.2 05/19/2014 1412   NITRITE NEGATIVE 05/31/2017 2113   LEUKOCYTESUR LARGE (A) 05/31/2017 2113   Sepsis Labs: '@LABRCNTIP'$ (procalcitonin:4,lacticidven:4) )No results found for this or any previous visit (from the  past 240 hour(s)).   Radiological Exams on Admission: Dg Chest 2 View  Result Date:  05/31/2017 CLINICAL DATA:  Chest pain with shortness of breath EXAM: CHEST - 2 VIEW COMPARISON:  04/02/2017 FINDINGS: No pleural effusion or focal airspace disease. Borderline to mild cardiomegaly with vascular congestion and mild pulmonary edema. No pneumothorax. IMPRESSION: Borderline heart size with vascular congestion and mild pulmonary edema. Electronically Signed   By: Donavan Foil M.D.   On: 05/31/2017 18:48     EKG: Independently reviewed.  Atrial fibrillation, RVR, QTC 488, poor R-wave progression, PVC    Assessment/Plan Principal Problem:   Atrial fibrillation with RVR (HCC) Active Problems:   HTN (hypertension)   Hyperlipidemia with target LDL less than 100   Severe obesity (BMI >= 40) (HCC)   Type 2 diabetes mellitus with chronic kidney disease, without long-term current use of insulin (HCC)   Pulmonary embolism (HCC)   Sleep apnea   DVT, lower extremity, proximal, acute, left (HCC)   Chest pain   New onset atrial fibrillation (HCC)   CKD (chronic kidney disease), stage III (HCC)   Acute on chronic diastolic CHF (congestive heart failure) ([place])   New onset of atrial fibrillation with RVR ([place]): HR is to 140s. Likely triggered by dCHF exacerbation. Pt also has chest pain and SOB. He is on Xarelto for PE/DVT. Will need to r/o the possibility of failure of Xarerlto treatment.   -will admit to SDU as inpt -cardizem gtt by IV - continue coreg -check TSH, Free T4  Acute on chronic diastolic CHF: Patient has weight gain of 7 pounds recently, elevated BNP 450, pulmonary edema on chest x-ray, leg edema, clinically consistent with CHF exacerbation. - Continue aspirin -Lasix to 40 mg daily- -f/u 2e echo  Chest pain:  Likely due to demand ischemia secondary to his CHF exacerbation, but will also need to r/o new PE and failure of Xarelto treatment - cycle CE q6 x3 and repeat EKG in the am  - prn Nitroglycerin, Morphine, and aspirin, lipitor  - Risk factor stratification: will  check FLP, UDS and A1C  - 2d echo - please call Card in AM - Stat D-dimer, if positive, will get V/Q to r/o PE - inpt card consult was requested via Epic  Hx of PE and left leg DVT: -on Xarelto  HTN:  -Continue home medications: Coreg, hydralazine, Cozaar -IV hydralazine prn  CKD-III: stable. Cre 1.26 -f/u by BMP  Type 2 diabetes mellitus with chronic kidney disease, without long-term current use of insulin: Last A1c 7.7, not controled. Patient is taking glipizide and metformin at home -SSI  OSA: -CPAP  Hyperlipidemia with target LDL less than 100: -lipitor   DVT ppx: on Xarelto Code Status: Full code Family Communication: None at bed side.   Disposition Plan:  Anticipate discharge back to previous home environment Consults called:  Dr. Aundra Dubin of Card Admission status: SDU/inpation       Date of Service 06/01/2017    Ivor Costa [facility] Pager [phone]  If 7PM-7AM, please contact night-coverage [email or web address] Password [email or web address] 06/01/2017, 2:21 AM

## 2017-06-01 NOTE — Plan of Care (Signed)
  Problem: Coping: Goal: Level of anxiety will decrease Outcome: Progressing Pt is calm and in good spirits at admission

## 2017-06-02 ENCOUNTER — Inpatient Hospital Stay (HOSPITAL_COMMUNITY): Payer: Medicare HMO

## 2017-06-02 DIAGNOSIS — R079 Chest pain, unspecified: Secondary | ICD-10-CM

## 2017-06-02 LAB — BASIC METABOLIC PANEL
Anion gap: 8 (ref 5–15)
BUN: 18 mg/dL (ref 6–20)
CO2: 24 mmol/L (ref 22–32)
Calcium: 8.3 mg/dL — ABNORMAL LOW (ref 8.9–10.3)
Chloride: 108 mmol/L (ref 101–111)
Creatinine, Ser: 1.21 mg/dL (ref 0.61–1.24)
GFR calc Af Amer: >60 mL/min (ref >60)
GFR calc non Af Amer: >60 mL/min (ref >60)
Glucose, Bld: 253 mg/dL — ABNORMAL HIGH (ref 65–99)
Potassium: 3.4 mmol/L — ABNORMAL LOW (ref 3.5–5.1)
Sodium: 140 mmol/L (ref 135–145)

## 2017-06-02 LAB — ECHOCARDIOGRAM COMPLETE
Height: 69 in
Weight: 5037.07 oz

## 2017-06-02 LAB — GLUCOSE, CAPILLARY
Glucose-Capillary: 223 mg/dL — ABNORMAL HIGH (ref 65–99)
Glucose-Capillary: 198 mg/dL — ABNORMAL HIGH (ref 65–99)
Glucose-Capillary: 201 mg/dL — ABNORMAL HIGH (ref 65–99)
Glucose-Capillary: 225 mg/dL — ABNORMAL HIGH (ref 65–99)

## 2017-06-02 LAB — CBC
HCT: 35.7 % — ABNORMAL LOW (ref 39.0–52.0)
Hemoglobin: 11 g/dL — ABNORMAL LOW (ref 13.0–17.0)
MCH: 26.4 pg (ref 26.0–34.0)
MCHC: 30.8 g/dL (ref 30.0–36.0)
MCV: 85.8 fL (ref 78.0–100.0)
Platelets: 275 10*3/uL (ref 150–400)
RBC: 4.16 MIL/uL — ABNORMAL LOW (ref 4.22–5.81)
RDW: 15.1 % (ref 11.5–15.5)
WBC: 7.7 10*3/uL (ref 4.0–10.5)

## 2017-06-02 MED ORDER — POTASSIUM CHLORIDE CRYS ER 10 MEQ PO TBCR: 10.0000 meq | EXTENDED_RELEASE_TABLET | Freq: Every day | ORAL | Status: DC

## 2017-06-02 MED ORDER — FUROSEMIDE 20 MG PO TABS
20.0000 mg | ORAL_TABLET | Freq: Every day | ORAL | Status: DC
  Administered 2017-06-03: 20 mg via ORAL
  Filled 2017-06-02: qty 1

## 2017-06-02 MED ORDER — SODIUM CHLORIDE 0.9% FLUSH
3.0000 mL | Freq: Two times a day (BID) | INTRAVENOUS | Status: DC
  Administered 2017-06-02: 3 mL via INTRAVENOUS

## 2017-06-02 MED ORDER — MAGNESIUM SULFATE 2 GM/50ML IV SOLN
2.0000 g | Freq: Once | INTRAVENOUS | Status: AC
  Administered 2017-06-02: 2 g via INTRAVENOUS
  Filled 2017-06-02: qty 50

## 2017-06-02 MED ORDER — CARVEDILOL 12.5 MG PO TABS
12.5000 mg | ORAL_TABLET | Freq: Two times a day (BID) | ORAL | Status: DC
  Administered 2017-06-02 – 2017-06-03 (×2): 12.5 mg via ORAL
  Filled 2017-06-02 (×2): qty 1

## 2017-06-02 MED ORDER — PERFLUTREN LIPID MICROSPHERE
1.0000 mL | INTRAVENOUS | Status: AC | PRN
  Administered 2017-06-02: 2 mL via INTRAVENOUS
  Filled 2017-06-02: qty 10

## 2017-06-02 MED ORDER — SODIUM CHLORIDE 0.9 % IV SOLN: 250.0000 mL | INTRAVENOUS | Status: DC

## 2017-06-02 MED ORDER — POTASSIUM CHLORIDE CRYS ER 20 MEQ PO TBCR
40.0000 meq | EXTENDED_RELEASE_TABLET | ORAL | Status: AC
  Administered 2017-06-02 (×2): 40 meq via ORAL
  Filled 2017-06-02 (×2): qty 2

## 2017-06-02 MED ORDER — POTASSIUM CHLORIDE CRYS ER 20 MEQ PO TBCR: 20.0000 meq | EXTENDED_RELEASE_TABLET | Freq: Two times a day (BID) | ORAL | Status: DC

## 2017-06-02 MED ORDER — SODIUM CHLORIDE 0.9% FLUSH: 3.0000 mL | INTRAVENOUS | Status: DC | PRN

## 2017-06-02 NOTE — Progress Notes (Signed)
  Echocardiogram 2D Echocardiogram has been performed.  Jennette Dubin 06/02/2017, 3:08 PM

## 2017-06-02 NOTE — Progress Notes (Signed)
[facility] Progress Note  Patient: Thomas [name] FUX:[id number]   PCP: [name], NP DOB: 07-21-1950   DOA: 05/31/2017   DOS: 06/02/2017   Date of Service: the patient was seen and examined on 06/02/2017  Subjective: Continues to have left-sided chest pain.  No nausea no vomiting no fever no chills no blood in the stool.  Brief hospital course: Pt. with PMH of  hypertension, hyperlipidemia, diabetes mellitus, OSA, GERD, DVT and PE onXarelto, CKD-III, dCHF and prostate cancer; admitted on 05/31/2017, presented with complaint of chest pain, was found to have A. fib with RVR. Currently further plan is continue current plan.  Assessment and Plan: New onset of atrial fibrillation with RVR ([place]): HR is to 140s. Likely triggered by dCHF exacerbation. Pt also has chest pain and SOB. He is on Xarelto for PE/DVT.  Started on Coreg, 12.5 mg twice daily.  IV Cardizem will be DC'd.  DC CV tomorrow per cardiology.  Acute on chronic combined systolic and diastolic CHF: Patient has weight gain of 7 pounds recently, elevated BNP 450, pulmonary edema on chest x-ray, leg edema, clinically consistent with CHF exacerbation. Treated with IV Lasix, now changing to p.o. Lasix.  As well as potassium.  Chest pain:   Atypical, pleuritic in nature. Serial troponins are negative.  Outpatient Myoview planned. - 2d echo shows EF of 45-50% with diffuse hypokinesis.  Hx of PE and left leg DVT: -on Xarelto do not think patient needs further workup to rule out a new PE.  HTN:  -Continue home medications: Coreg, hydralazine, Cozaar -IV hydralazine prn  CKD-III: stable. Cre 1.26 -f/u by BMP  Type 2 diabetes mellitus with chronic kidney disease, without long-term current use of insulin: Last A1c 7.7, not controled. Patient is taking glipizide and metformin at home -Continue SSI and is not happy with  insulin though.  OSA: -CPAP  Hyperlipidemia with target LDL less than  100: -lipitor  Diet: cardiac diet DVT Prophylaxis:on therapeutic anticoagulation.  Advance goals of care discussion: full code  Family Communication: family was present at bedside, at the time of interview. The pt provided permission to discuss medical plan with the family. Opportunity was given to ask question and all questions were answered satisfactorily.   Disposition:  Discharge to home.  Consultants: general cardiology  Procedures: Echocardiogram   Antibiotics: Anti-infectives (From admission, onward)   None       Objective: Physical Exam: Vitals:   06/02/17 0401 06/02/17 0741 06/02/17 1209 06/02/17 1658  BP: 117/65 129/88 112/79 139/75  Pulse: 82 91 83 78  Resp: 18 (!) 26 (!) 21 (!) 22  Temp: 98.6 F (37 C) 99 F (37.2 C) 98.6 F (37 C) 98.3 F (36.8 C)  TempSrc: Oral Oral Oral Oral  SpO2: 95% 93% 96% 96%  Weight: (!) 142.8 kg (314 lb 13.1 oz)     Height:        Intake/Output Summary (Last 24 hours) at 06/02/2017 1707 Last data filed at 06/02/2017 1300 Gross per 24 hour  Intake 1369.05 ml  Output 900 ml  Net 469.05 ml   Filed Weights   05/31/17 1743 06/01/17 0121 06/02/17 0401  Weight: (!) 142.9 kg (315 lb) (!) 143.3 kg (316 lb) (!) 142.8 kg (314 lb 13.1 oz)   General: Alert, Awake and Oriented to Time, Place and Person. Appear in mild distress, affect appropriate Eyes: PERRL, Conjunctiva normal ENT: Oral Mucosa clear moist. Neck: no JVD, no Abnormal Mass Or lumps Cardiovascular: S1 and S2 Present,  no Murmur, Peripheral Pulses Present Respiratory: normal respiratory effort, Bilateral Air entry equal and Decreased, no use of accessory muscle, basal Crackles, no wheezes Abdomen: Bowel Sound present, Soft and no tenderness, no hernia Skin: no redness, no Rash, no induration Extremities: no Pedal edema, no calf tenderness Neurologic: Grossly no focal neuro deficit. Bilaterally Equal motor strength  Data Reviewed: CBC: Recent Labs  Lab 05/31/17 1758  06/02/17 0751  WBC 8.0 7.7  NEUTROABS 5.8  --   HGB 12.0* 11.0*  HCT 37.0* 35.7*  MCV 84.3 85.8  PLT 275 062   Basic Metabolic Panel: Recent Labs  Lab 05/31/17 1758 06/01/17 0236 06/02/17 0751  NA 138  --  140  K 3.3*  --  3.4*  CL 106  --  108  CO2 22  --  24  GLUCOSE 164*  --  253*  BUN 20  --  18  CREATININE 1.26*  --  1.21  CALCIUM 8.7*  --  8.3*  MG  --  1.7  --     Liver Function Tests: Recent Labs  Lab 05/31/17 1758  AST 14*  ALT 13*  ALKPHOS 67  BILITOT 0.7  PROT 7.6  ALBUMIN 3.6   No results for input(s): LIPASE, AMYLASE in the last 168 hours. No results for input(s): AMMONIA in the last 168 hours. Coagulation Profile: No results for input(s): INR, PROTIME in the last 168 hours. Cardiac Enzymes: Recent Labs  Lab 05/31/17 1758 06/01/17 0236 06/01/17 0813 06/01/17 1331  TROPONINI <0.03 <0.03 <0.03 <0.03   BNP (last 3 results) No results for input(s): PROBNP in the last 8760 hours. CBG: Recent Labs  Lab 06/01/17 1627 06/01/17 2106 06/02/17 0737 06/02/17 1140 06/02/17 1633  GLUCAP 243* 175* 223* 198* 201*   Studies: No results found.  Scheduled Meds: . amLODipine  10 mg Oral Daily  . aspirin EC  81 mg Oral Daily  . atorvastatin  20 mg Oral q1800  . carvedilol  12.5 mg Oral BID WC  . [START ON 06/03/2017] furosemide  20 mg Oral Daily  . hydrALAZINE  25 mg Oral TID  . insulin aspart  0-5 Units Subcutaneous QHS  . insulin aspart  0-9 Units Subcutaneous TID WC  . losartan  100 mg Oral Daily  . pantoprazole  40 mg Oral Daily  . rivaroxaban  20 mg Oral Q supper  . sodium chloride flush  3 mL Intravenous Q12H  . sodium chloride flush  3 mL Intravenous Q12H   Continuous Infusions: . sodium chloride    . sodium chloride    . diltiazem (CARDIZEM) infusion 12.5 mg/hr (06/02/17 1112)   PRN Meds: acetaminophen, dextromethorphan-guaiFENesin, hydrALAZINE, levalbuterol, morphine injection, nitroGLYCERIN, ondansetron (ZOFRAN) IV, senna-docusate,  sodium chloride flush, sodium chloride flush, traMADol, zolpidem  Time spent: 35 minutes  Author: Berle Mull, MD [facility] Pager: [phone] 06/02/2017 5:07 PM  If 7PM-7AM, please contact night-coverage at [email or web address], password [facility]

## 2017-06-02 NOTE — H&P (View-Only) (Signed)
Progress Note  Patient Name: Thomas [name] Date of Encounter: 06/02/2017  Primary Cardiologist: Larae Grooms, MD   Subjective   Mild chest soreness, no pain. No SOB.   Inpatient Medications    Scheduled Meds: . amLODipine  10 mg Oral Daily  . aspirin EC  81 mg Oral Daily  . atorvastatin  20 mg Oral q1800  . carvedilol  6.25 mg Oral BID WC  . furosemide  40 mg Intravenous Daily  . hydrALAZINE  25 mg Oral TID  . insulin aspart  0-5 Units Subcutaneous QHS  . insulin aspart  0-9 Units Subcutaneous TID WC  . losartan  100 mg Oral Daily  . pantoprazole  40 mg Oral Daily  . rivaroxaban  20 mg Oral Q supper  . sodium chloride flush  3 mL Intravenous Q12H   Continuous Infusions: . sodium chloride    . diltiazem (CARDIZEM) infusion 12.5 mg/hr (06/02/17 0647)   PRN Meds: acetaminophen, dextromethorphan-guaiFENesin, hydrALAZINE, levalbuterol, morphine injection, nitroGLYCERIN, ondansetron (ZOFRAN) IV, senna-docusate, sodium chloride flush, traMADol, zolpidem   Vital Signs    Vitals:   06/01/17 2108 06/02/17 0000 06/02/17 0401 06/02/17 0741  BP: 125/85 120/83 117/65 129/88  Pulse: 74 81 82 91  Resp: 18 18 18  (!) 26  Temp: 98 F (36.7 C) 98.5 F (36.9 C) 98.6 F (37 C) 99 F (37.2 C)  TempSrc: Oral Oral Oral Oral  SpO2: 96% 96% 95% 93%  Weight:   (!) 314 lb 13.1 oz (142.8 kg)   Height:        Intake/Output Summary (Last 24 hours) at 06/02/2017 0955 Last data filed at 06/02/2017 0900 Gross per 24 hour  Intake 1489.05 ml  Output 900 ml  Net 589.05 ml   Filed Weights   05/31/17 1743 06/01/17 0121 06/02/17 0401  Weight: (!) 315 lb (142.9 kg) (!) 316 lb (143.3 kg) (!) 314 lb 13.1 oz (142.8 kg)    Telemetry    Atrial fibrillation with 17 beats of NSVT - Personally Reviewed  ECG    Atrial fibrillation with RVR  - Personally Reviewed  Physical Exam   GEN: No acute distress.   Neck: No JVD Cardiac: irregularly irregular, no murmurs, rubs, or gallops.    Respiratory: Clear to auscultation bilaterally. GI: Soft, nontender, non-distended  MS: No edema; No deformity. Neuro:  Nonfocal  Psych: Normal affect   Labs    Chemistry Recent Labs  Lab 05/31/17 1758 06/02/17 0751  NA 138 140  K 3.3* 3.4*  CL 106 108  CO2 22 24  GLUCOSE 164* 253*  BUN 20 18  CREATININE 1.26* 1.21  CALCIUM 8.7* 8.3*  PROT 7.6  --   ALBUMIN 3.6  --   AST 14*  --   ALT 13*  --   ALKPHOS 67  --   BILITOT 0.7  --   GFRNONAA 58* >60  GFRAA >60 >60  ANIONGAP 10 8     Hematology Recent Labs  Lab 05/31/17 1758 06/02/17 0751  WBC 8.0 7.7  RBC 4.39 4.16*  HGB 12.0* 11.0*  HCT 37.0* 35.7*  MCV 84.3 85.8  MCH 27.3 26.4  MCHC 32.4 30.8  RDW 15.0 15.1  PLT 275 275    Cardiac Enzymes Recent Labs  Lab 05/31/17 1758 06/01/17 0236 06/01/17 0813 06/01/17 1331  TROPONINI <0.03 <0.03 <0.03 <0.03   No results for input(s): TROPIPOC in the last 168 hours.   BNP Recent Labs  Lab 05/31/17 1758  BNP 450.6*  DDimer  Recent Labs  Lab 06/01/17 0236  DDIMER 0.58*     Radiology    Dg Chest 2 View  Result Date: 05/31/2017 CLINICAL DATA:  Chest pain with shortness of breath EXAM: CHEST - 2 VIEW COMPARISON:  04/02/2017 FINDINGS: No pleural effusion or focal airspace disease. Borderline to mild cardiomegaly with vascular congestion and mild pulmonary edema. No pneumothorax. IMPRESSION: Borderline heart size with vascular congestion and mild pulmonary edema. Electronically Signed   By: Donavan Foil M.D.   On: 05/31/2017 18:48    Cardiac Studies   Cardiac catheterization 2008: FINDINGS: The left main was large vessel and widely patent. The circumflex was a large dominant vessel which was angiographically normal. There is an OM1 which is medium-sized with mild irregularities. There is a second obtuse marginal which is a large vessel which is angiographically normal. The PDA did arise from the left circumflex that supplied blood flow  to the apex. The left anterior descending was a large vessel with minor luminal irregularities. The first diagonal is a large branching vessel with minor irregularities. The right coronary artery is a nondominant vessel and is widely patent.  The left ventriculogram shows normal left ventricular function with an estimated ejection fraction of 55%.  The abdominal aortogram shows bilateral single renal arteries. There did not appear to be any hemodynamically significant stenoses although the right renal artery is somewhat unclear. There is no abdominal aortic aneurysm.  HEMODYNAMIC RESULTS: Left ventricular pressure 158/61, LVEDP of 22 mmHg. Aortic pressure of 159/90 with a mean aortic pressure of 117 mmHg.  IMPRESSION: 1. No significant coronary artery disease. 2. Normal ventricular function with ejection fraction estimated to be at 55%. 3. No renal artery stenosis. 4. Mildly elevated left ventricular end-diastolic pressure.  RECOMMENDATIONS: The patient continue to with aggressive preventive therapy. He will be discharged home with follow-up with the office to relieve his noncardiac pain. No further cardiac workup is required prior to his gastric bypass surgery.    CTA of chest 04/02/2017  IMPRESSION: 1. Negative for acute pulmonary embolus. 2. Aortic atherosclerosis and atherosclerotic calcifications within the LAD and left circumflex coronary arteries. 3. Gallstone.   Patient Profile     67 y.o. male with a PMH of chronic diastolic CHF, HTN, HLD, DM type 2, CKD stage III, History of PE/DVT on xarelto, OSA, morbid obesity, GERD, and prostate cancer s/p seed implantation who is being seen today for the evaluation of chest pain at the request of Dr. Posey Pronto.    Assessment & Plan    1. Chest pain: last cath 2008 no significant CAD. CTA of chest 03/2017 ntoed to have coronary athereosclerosis, no PE.   - chest pain atypical,  outpatient myoview planned  - V/Q scan was originally ordered but cancelled.   2. New onset atrial fibrillation with RVR  - TSH normal, free T4 mildly elevated. Xarelto with DVT/PE  - Increase coreg to 12.5 mg BID, wean IV diltiazem. Per patient, he has been compliant with Xarelto  -  Planning for DCCV tomorrow, risk and benefit discussed, 1:10000 hypotension/bradycardia/aspiration event, spoke w cardmaster, pending anesthesia slot given his body size.  3. NSVT: 17 beats of NSVT this morning after 7AM, K 3.4, obtain Mg, replace potassium.  4. Acute on chronic diastolic HF: euvolemic, transition to PO lasix 20mg  daily with 29meq KCl. Low K today, replete  5. HTN: increase coreg 12.5 mg BID  6. HLD: on lipitor 20mg  daily  7. DM type II  7. H/o PE/LLE  DVT: compliant with Xarelto.   8. CKD stage III  9. OSA on CPAP  For questions or updates, please contact [place] Please consult [email or web address] for contact info under Cardiology/STEMI.      Hilbert Corrigan, PA  06/02/2017, 9:55 AM    History and all data above reviewed.  Patient examined.  I agree with the findings as above. Still with some mild dyspnea with walking and dizziness.  No chest pain. No palpitations.   The patient exam reveals [id number]  ,  Lungs: Clear  ,  Abd: Positive bowel sounds, no rebound no guarding, Ext No edema  .  All available labs, radiology testing, previous records reviewed. Agree with documented assessment and plan. Atrial fib:  DCCV tomorrow. Trying to get rid of the IV Dilt as above.  Transition to PO Lasix.   Jeneen Rinks Caitrin Pendergraph  11:35 AM  06/02/2017

## 2017-06-02 NOTE — Plan of Care (Signed)
  Problem: Education: Goal: Knowledge of General Education information will improve Outcome: Completed/Met  Pt oriented to unit, plan of care, and method of reporting concerns. Call light and bedside table within reach. 

## 2017-06-02 NOTE — Progress Notes (Addendum)
Progress Note  Patient Name: Thomas [name] Date of Encounter: 06/02/2017  Primary Cardiologist: Larae Grooms, MD   Subjective   Mild chest soreness, no pain. No SOB.   Inpatient Medications    Scheduled Meds: . amLODipine  10 mg Oral Daily  . aspirin EC  81 mg Oral Daily  . atorvastatin  20 mg Oral q1800  . carvedilol  6.25 mg Oral BID WC  . furosemide  40 mg Intravenous Daily  . hydrALAZINE  25 mg Oral TID  . insulin aspart  0-5 Units Subcutaneous QHS  . insulin aspart  0-9 Units Subcutaneous TID WC  . losartan  100 mg Oral Daily  . pantoprazole  40 mg Oral Daily  . rivaroxaban  20 mg Oral Q supper  . sodium chloride flush  3 mL Intravenous Q12H   Continuous Infusions: . sodium chloride    . diltiazem (CARDIZEM) infusion 12.5 mg/hr (06/02/17 0647)   PRN Meds: acetaminophen, dextromethorphan-guaiFENesin, hydrALAZINE, levalbuterol, morphine injection, nitroGLYCERIN, ondansetron (ZOFRAN) IV, senna-docusate, sodium chloride flush, traMADol, zolpidem   Vital Signs    Vitals:   06/01/17 2108 06/02/17 0000 06/02/17 0401 06/02/17 0741  BP: 125/85 120/83 117/65 129/88  Pulse: 74 81 82 91  Resp: 18 18 18  (!) 26  Temp: 98 F (36.7 C) 98.5 F (36.9 C) 98.6 F (37 C) 99 F (37.2 C)  TempSrc: Oral Oral Oral Oral  SpO2: 96% 96% 95% 93%  Weight:   (!) 314 lb 13.1 oz (142.8 kg)   Height:        Intake/Output Summary (Last 24 hours) at 06/02/2017 0955 Last data filed at 06/02/2017 0900 Gross per 24 hour  Intake 1489.05 ml  Output 900 ml  Net 589.05 ml   Filed Weights   05/31/17 1743 06/01/17 0121 06/02/17 0401  Weight: (!) 315 lb (142.9 kg) (!) 316 lb (143.3 kg) (!) 314 lb 13.1 oz (142.8 kg)    Telemetry    Atrial fibrillation with 17 beats of NSVT - Personally Reviewed  ECG    Atrial fibrillation with RVR  - Personally Reviewed  Physical Exam   GEN: No acute distress.   Neck: No JVD Cardiac: irregularly irregular, no murmurs, rubs, or gallops.    Respiratory: Clear to auscultation bilaterally. GI: Soft, nontender, non-distended  MS: No edema; No deformity. Neuro:  Nonfocal  Psych: Normal affect   Labs    Chemistry Recent Labs  Lab 05/31/17 1758 06/02/17 0751  NA 138 140  K 3.3* 3.4*  CL 106 108  CO2 22 24  GLUCOSE 164* 253*  BUN 20 18  CREATININE 1.26* 1.21  CALCIUM 8.7* 8.3*  PROT 7.6  --   ALBUMIN 3.6  --   AST 14*  --   ALT 13*  --   ALKPHOS 67  --   BILITOT 0.7  --   GFRNONAA 58* >60  GFRAA >60 >60  ANIONGAP 10 8     Hematology Recent Labs  Lab 05/31/17 1758 06/02/17 0751  WBC 8.0 7.7  RBC 4.39 4.16*  HGB 12.0* 11.0*  HCT 37.0* 35.7*  MCV 84.3 85.8  MCH 27.3 26.4  MCHC 32.4 30.8  RDW 15.0 15.1  PLT 275 275    Cardiac Enzymes Recent Labs  Lab 05/31/17 1758 06/01/17 0236 06/01/17 0813 06/01/17 1331  TROPONINI <0.03 <0.03 <0.03 <0.03   No results for input(s): TROPIPOC in the last 168 hours.   BNP Recent Labs  Lab 05/31/17 1758  BNP 450.6*  DDimer  Recent Labs  Lab 06/01/17 0236  DDIMER 0.58*     Radiology    Dg Chest 2 View  Result Date: 05/31/2017 CLINICAL DATA:  Chest pain with shortness of breath EXAM: CHEST - 2 VIEW COMPARISON:  04/02/2017 FINDINGS: No pleural effusion or focal airspace disease. Borderline to mild cardiomegaly with vascular congestion and mild pulmonary edema. No pneumothorax. IMPRESSION: Borderline heart size with vascular congestion and mild pulmonary edema. Electronically Signed   By: Donavan Foil M.D.   On: 05/31/2017 18:48    Cardiac Studies   Cardiac catheterization 2008: FINDINGS: The left main was large vessel and widely patent. The circumflex was a large dominant vessel which was angiographically normal. There is an OM1 which is medium-sized with mild irregularities. There is a second obtuse marginal which is a large vessel which is angiographically normal. The PDA did arise from the left circumflex that supplied blood flow  to the apex. The left anterior descending was a large vessel with minor luminal irregularities. The first diagonal is a large branching vessel with minor irregularities. The right coronary artery is a nondominant vessel and is widely patent.  The left ventriculogram shows normal left ventricular function with an estimated ejection fraction of 55%.  The abdominal aortogram shows bilateral single renal arteries. There did not appear to be any hemodynamically significant stenoses although the right renal artery is somewhat unclear. There is no abdominal aortic aneurysm.  HEMODYNAMIC RESULTS: Left ventricular pressure 158/61, LVEDP of 22 mmHg. Aortic pressure of 159/90 with a mean aortic pressure of 117 mmHg.  IMPRESSION: 1. No significant coronary artery disease. 2. Normal ventricular function with ejection fraction estimated to be at 55%. 3. No renal artery stenosis. 4. Mildly elevated left ventricular end-diastolic pressure.  RECOMMENDATIONS: The patient continue to with aggressive preventive therapy. He will be discharged home with follow-up with the office to relieve his noncardiac pain. No further cardiac workup is required prior to his gastric bypass surgery.    CTA of chest 04/02/2017  IMPRESSION: 1. Negative for acute pulmonary embolus. 2. Aortic atherosclerosis and atherosclerotic calcifications within the LAD and left circumflex coronary arteries. 3. Gallstone.   Patient Profile     67 y.o. male with a PMH of chronic diastolic CHF, HTN, HLD, DM type 2, CKD stage III, History of PE/DVT on xarelto, OSA, morbid obesity, GERD, and prostate cancer s/p seed implantation who is being seen today for the evaluation of chest pain at the request of Dr. Posey Pronto.    Assessment & Plan    1. Chest pain: last cath 2008 no significant CAD. CTA of chest 03/2017 ntoed to have coronary athereosclerosis, no PE.   - chest pain atypical,  outpatient myoview planned  - V/Q scan was originally ordered but cancelled.   2. New onset atrial fibrillation with RVR  - TSH normal, free T4 mildly elevated. Xarelto with DVT/PE  - Increase coreg to 12.5 mg BID, wean IV diltiazem. Per patient, he has been compliant with Xarelto  -  Planning for DCCV tomorrow, risk and benefit discussed, 1:10000 hypotension/bradycardia/aspiration event, spoke w cardmaster, pending anesthesia slot given his body size.  3. NSVT: 17 beats of NSVT this morning after 7AM, K 3.4, obtain Mg, replace potassium.  4. Acute on chronic diastolic HF: euvolemic, transition to PO lasix 20mg  daily with 74meq KCl. Low K today, replete  5. HTN: increase coreg 12.5 mg BID  6. HLD: on lipitor 20mg  daily  7. DM type II  7. H/o PE/LLE  DVT: compliant with Xarelto.   8. CKD stage III  9. OSA on CPAP  For questions or updates, please contact [place] Please consult [email or web address] for contact info under Cardiology/STEMI.      Hilbert Corrigan, PA  06/02/2017, 9:55 AM    History and all data above reviewed.  Patient examined.  I agree with the findings as above. Still with some mild dyspnea with walking and dizziness.  No chest pain. No palpitations.   The patient exam reveals [id number]  ,  Lungs: Clear  ,  Abd: Positive bowel sounds, no rebound no guarding, Ext No edema  .  All available labs, radiology testing, previous records reviewed. Agree with documented assessment and plan. Atrial fib:  DCCV tomorrow. Trying to get rid of the IV Dilt as above.  Transition to PO Lasix.   Jeneen Rinks Medhansh Brinkmeier  11:35 AM  06/02/2017

## 2017-06-02 NOTE — Progress Notes (Signed)
Inpatient Diabetes Program Recommendations  AACE/ADA: New Consensus Statement on Inpatient Glycemic Control (2015)  Target Ranges:  Prepandial:   less than 140 mg/dL      Peak postprandial:   less than 180 mg/dL (1-2 hours)      Critically ill patients:  140 - 180 mg/dL   Results for Thomas [name], Thomas [name] (MRN [id number]) as of 06/02/2017 07:22  Ref. Range 06/01/2017 08:00 06/01/2017 11:39 06/01/2017 16:27 06/01/2017 21:06  Glucose-Capillary Latest Ref Range: 65 - 99 mg/dL 212 (H) 222 (H) 243 (H) 175 (H)   Review of Glycemic Control  Diabetes history: DM2 Outpatient Diabetes medications: Glipizide 5 mg QAM, Metformin 500 mg BID Current orders for Inpatient glycemic control: Novolog 0-9 units TID with meals, Novolog 0-5 units QHS  Inpatient Diabetes Program Recommendations: Insulin - Meal Coverage: While inpatient, please consider ordering Novolog 4 units TID with meals for meal coverage if patient eats at least 50% of meals. HgbA1C: A1C 7.1% on 06/01/17 indicating an average glucose of 157 mg/dl over the past 2-3 months.  Thanks, Barnie Alderman, RN, MSN, CDE Diabetes Coordinator Inpatient Diabetes Program [phone] (Team Pager from 8am to 5pm)

## 2017-06-03 ENCOUNTER — Inpatient Hospital Stay (HOSPITAL_COMMUNITY): Payer: Medicare HMO | Admitting: Anesthesiology

## 2017-06-03 ENCOUNTER — Encounter (HOSPITAL_COMMUNITY)
Admission: EM | Disposition: A | Discharge status: Home / Self Care | Payer: Self-pay | Source: Home / Self Care | Attending: Internal Medicine

## 2017-06-03 DIAGNOSIS — I5033 Acute on chronic diastolic (congestive) heart failure: Secondary | ICD-10-CM

## 2017-06-03 DIAGNOSIS — I10 Essential (primary) hypertension: Secondary | ICD-10-CM

## 2017-06-03 DIAGNOSIS — N183 Chronic kidney disease, stage 3 (moderate): Secondary | ICD-10-CM

## 2017-06-03 HISTORY — PX: CARDIOVERSION: SHX1299

## 2017-06-03 LAB — BASIC METABOLIC PANEL
Anion gap: 9 (ref 5–15)
BUN: 18 mg/dL (ref 6–20)
CO2: 25 mmol/L (ref 22–32)
Calcium: 8.3 mg/dL — ABNORMAL LOW (ref 8.9–10.3)
Chloride: 107 mmol/L (ref 101–111)
Creatinine, Ser: 1.37 mg/dL — ABNORMAL HIGH (ref 0.61–1.24)
GFR calc Af Amer: >60 mL/min (ref >60)
GFR calc non Af Amer: 52 mL/min — ABNORMAL LOW (ref >60)
Glucose, Bld: 237 mg/dL — ABNORMAL HIGH (ref 65–99)
Potassium: 3.6 mmol/L (ref 3.5–5.1)
Sodium: 141 mmol/L (ref 135–145)

## 2017-06-03 LAB — CBC
HCT: 36.6 % — ABNORMAL LOW (ref 39.0–52.0)
Hemoglobin: 11.4 g/dL — ABNORMAL LOW (ref 13.0–17.0)
MCH: 26.8 pg (ref 26.0–34.0)
MCHC: 31.1 g/dL (ref 30.0–36.0)
MCV: 86.1 fL (ref 78.0–100.0)
Platelets: 286 10*3/uL (ref 150–400)
RBC: 4.25 MIL/uL (ref 4.22–5.81)
RDW: 14.9 % (ref 11.5–15.5)
WBC: 7.3 10*3/uL (ref 4.0–10.5)

## 2017-06-03 LAB — URINE CULTURE: Culture: 70000 — AB

## 2017-06-03 LAB — GLUCOSE, CAPILLARY
Glucose-Capillary: 230 mg/dL — ABNORMAL HIGH (ref 65–99)
Glucose-Capillary: 216 mg/dL — ABNORMAL HIGH (ref 65–99)

## 2017-06-03 LAB — MAGNESIUM: Magnesium: 2.1 mg/dL (ref 1.7–2.4)

## 2017-06-03 SURGERY — CARDIOVERSION
Anesthesia: General

## 2017-06-03 MED ORDER — FUROSEMIDE 20 MG PO TABS: 20.0000 mg | ORAL_TABLET | Freq: Every day | ORAL | 1 refills | Status: DC

## 2017-06-03 MED ORDER — CARVEDILOL 12.5 MG PO TABS: 12.5000 mg | ORAL_TABLET | Freq: Two times a day (BID) | ORAL | 1 refills | Status: DC

## 2017-06-03 MED ORDER — PROPOFOL 10 MG/ML IV BOLUS
INTRAVENOUS | Status: DC | PRN
  Administered 2017-06-03: 110 mg via INTRAVENOUS

## 2017-06-03 MED ORDER — LIDOCAINE HCL (CARDIAC) PF 100 MG/5ML IV SOSY
PREFILLED_SYRINGE | INTRAVENOUS | Status: DC | PRN
  Administered 2017-06-03: 100 mg via INTRATRACHEAL

## 2017-06-03 NOTE — Transfer of Care (Signed)
Immediate Anesthesia Transfer of Care Note  Patient: Thomas [name]  Procedure(s) Performed: CARDIOVERSION (N/A )  Patient Location: PACU  Anesthesia Type:General  Level of Consciousness: awake, alert  and oriented  Airway & Oxygen Therapy: Patient Spontanous Breathing  Post-op Assessment: Report given to RN, Post -op Vital signs reviewed and stable and Patient moving all extremities X 4  Post vital signs: Reviewed and stable  Last Vitals:  Vitals Value Taken Time  BP    Temp    Pulse 90 06/03/2017 10:09 AM  Resp 20 06/03/2017 10:09 AM  SpO2 97 % 06/03/2017 10:09 AM  Vitals shown include unvalidated device data.  Last Pain:  Vitals:   06/03/17 0909  TempSrc: Oral  PainSc: 0-No pain      Patients Stated Pain Goal: 0 (85/46/27 0350)  Complications: No apparent anesthesia complications

## 2017-06-03 NOTE — Interval H&P Note (Signed)
History and Physical Interval Note:  06/03/2017 10:06 AM  Thomas [name]  has presented today for surgery, with the diagnosis of afib  The various methods of treatment have been discussed with the patient and family. After consideration of risks, benefits and other options for treatment, the patient has consented to  Procedure(s): CARDIOVERSION (N/A) as a surgical intervention .  The patient's history has been reviewed, patient examined, no change in status, stable for surgery.  I have reviewed the patient's chart and labs.  Questions were answered to the patient's satisfaction.     [employer]

## 2017-06-03 NOTE — Progress Notes (Signed)
Inpatient Diabetes Program Recommendations  AACE/ADA: New Consensus Statement on Inpatient Glycemic Control (2015)  Target Ranges:  Prepandial:   less than 140 mg/dL      Peak postprandial:   less than 180 mg/dL (1-2 hours)      Critically ill patients:  140 - 180 mg/dL   Results for STROTHER, EVERITT (MRN [id number]) as of 06/03/2017 08:37  Ref. Range 06/02/2017 07:37 06/02/2017 11:40 06/02/2017 16:33 06/02/2017 20:42 06/03/2017 07:40  Glucose-Capillary Latest Ref Range: 65 - 99 mg/dL 223 (H) 198 (H) 201 (H) 225 (H) 230 (H)   Review of Glycemic Control  Diabetes history: DM2 Outpatient Diabetes medications: Glipizide 5 mg QAM, Metformin 500 mg BID Current orders for Inpatient glycemic control: Novolog 0-9 units TID with meals, Novolog 0-5 units QHS  Inpatient Diabetes Program Recommendations: Insulin - Meal Coverage: Post prandial glucose is consistently elevated. While inpatient, please consider ordering Novolog 4 units TID with meals for meal coverage if patient eats at least 50% of meals. HgbA1C: A1C 7.1% on 06/01/17 indicating an average glucose of 157 mg/dl over the past 2-3 months.  Thanks, Barnie Alderman, RN, MSN, CDE Diabetes Coordinator Inpatient Diabetes Program [phone] (Team Pager from 8am to 5pm)

## 2017-06-03 NOTE — Anesthesia Preprocedure Evaluation (Signed)
Anesthesia Evaluation  Patient identified by MRN, date of birth, ID band Patient awake    Reviewed: Allergy & Precautions, H&P , Patient's Chart, lab work & pertinent test results, reviewed documented beta blocker date and time   Airway Mallampati: II  TM Distance: >3 FB Neck ROM: full    Dental no notable dental hx.    Pulmonary    Pulmonary exam normal breath sounds clear to auscultation       Cardiovascular hypertension,  Rhythm:regular Rate:Normal     Neuro/Psych    GI/Hepatic   Endo/Other  diabetes  Renal/GU      Musculoskeletal   Abdominal   Peds  Hematology   Anesthesia Other Findings 45% EF DM OSA  Reproductive/Obstetrics                             Anesthesia Physical Anesthesia Plan  ASA: III  Anesthesia Plan: General   Post-op Pain Management:    Induction: Intravenous  PONV Risk Score and Plan:   Airway Management Planned: Mask  Additional Equipment:   Intra-op Plan:   Post-operative Plan:   Informed Consent: I have reviewed the patients History and Physical, chart, labs and discussed the procedure including the risks, benefits and alternatives for the proposed anesthesia with the patient or authorized representative who has indicated his/her understanding and acceptance.   Dental Advisory Given  Plan Discussed with: CRNA and Surgeon  Anesthesia Plan Comments: ( )        Anesthesia Quick Evaluation

## 2017-06-03 NOTE — Discharge Instructions (Signed)
Follow with Dr. Irish Lack in 1-2 weeks for stress testing  Please get a complete blood count and chemistry panel checked by your Primary MD at your next visit, and again as instructed by your Primary MD. Please get your medications reviewed and adjusted by your Primary MD.  Please request your Primary MD to go over all Hospital Tests and Procedure/Radiological results at the follow up, please get all Hospital records sent to your Prim MD by signing hospital release before you go home.  If you had Pneumonia of Lung problems at the Hospital: Please get a 2 view Chest X ray done in 6-8 weeks after hospital discharge or sooner if instructed by your Primary MD.  If you have Congestive Heart Failure: Please call your Cardiologist or Primary MD anytime you have any of the following symptoms:  1) 3 pound weight gain in 24 hours or 5 pounds in 1 week  2) shortness of breath, with or without a dry hacking cough  3) swelling in the hands, feet or stomach  4) if you have to sleep on extra pillows at night in order to breathe  Follow cardiac low salt diet and 1.5 lit/day fluid restriction.  If you have diabetes Accuchecks 4 times/day, Once in AM empty stomach and then before each meal. Log in all results and show them to your primary doctor at your next visit. If any glucose reading is under 80 or above 300 call your primary MD immediately.  If you have Seizure/Convulsions/Epilepsy: Please do not drive, operate heavy machinery, participate in activities at heights or participate in high speed sports until you have seen by Primary MD or a Neurologist and advised to do so again.  If you had Gastrointestinal Bleeding: Please ask your Primary MD to check a complete blood count within one week of discharge or at your next visit. Your endoscopic/colonoscopic biopsies that are pending at the time of discharge, will also need to followed by your Primary MD.  Get Medicines reviewed and adjusted. Please take  all your medications with you for your next visit with your Primary MD  Please request your Primary MD to go over all hospital tests and procedure/radiological results at the follow up, please ask your Primary MD to get all Hospital records sent to his/her office.  If you experience worsening of your admission symptoms, develop shortness of breath, life threatening emergency, suicidal or homicidal thoughts you must seek medical attention immediately by calling 911 or calling your MD immediately  if symptoms less severe.  You must read complete instructions/literature along with all the possible adverse reactions/side effects for all the Medicines you take and that have been prescribed to you. Take any new Medicines after you have completely understood and accpet all the possible adverse reactions/side effects.   Do not drive or operate heavy machinery when taking Pain medications.   Do not take more than prescribed Pain, Sleep and Anxiety Medications  Special Instructions: If you have smoked or chewed Tobacco  in the last 2 yrs please stop smoking, stop any regular Alcohol  and or any Recreational drug use.  Wear Seat belts while driving.  Please note You were cared for by a hospitalist during your hospital stay. If you have any questions about your discharge medications or the care you received while you were in the hospital after you are discharged, you can call the unit and asked to speak with the hospitalist on call if the hospitalist that took care of you is not  available. Once you are discharged, your primary care physician will handle any further medical issues. Please note that NO REFILLS for any discharge medications will be authorized once you are discharged, as it is imperative that you return to your primary care physician (or establish a relationship with a primary care physician if you do not have one) for your aftercare needs so that they can reassess your need for medications and  monitor your lab values.  You can reach the hospitalist office at phone [phone] or fax [phone]   If you do not have a primary care physician, you can call [phone] for a physician referral.  Activity: As tolerated with Full fall precautions use walker/cane & assistance as needed  Diet: low salt, diabetic  Disposition Home

## 2017-06-03 NOTE — CV Procedure (Signed)
    Electrical Cardioversion Procedure Note Thomas [name] [id number] Feb 18, 1950  Procedure: Electrical Cardioversion Indications:  Atrial Fibrillation  Time Out: Verified patient identification, verified procedure,medications/allergies/relevent history reviewed, required imaging and test results available.  Performed  Procedure Details  The patient was NPO after midnight. Anesthesia was administered at the beside  by Dr.Jackson with 120mg  of propofol.  Cardioversion was performed with synchronized biphasic defibrillation via AP pads with 120, 200 joules.  2 attempt(s) were performed.  The patient converted to normal sinus rhythm. The patient tolerated the procedure well   IMPRESSION:  Successful cardioversion of atrial fibrillation   Candee Furbish 06/03/2017, 10:21 AM

## 2017-06-03 NOTE — Anesthesia Postprocedure Evaluation (Signed)
Anesthesia Post Note  Patient: Thomas [name]  Procedure(s) Performed: CARDIOVERSION (N/A )     Patient location during evaluation: PACU Anesthesia Type: General Level of consciousness: awake and alert Pain management: pain level controlled Vital Signs Assessment: post-procedure vital signs reviewed and stable Respiratory status: spontaneous breathing, nonlabored ventilation, respiratory function stable and patient connected to nasal cannula oxygen Cardiovascular status: blood pressure returned to baseline and stable Postop Assessment: no apparent nausea or vomiting Anesthetic complications: no    Last Vitals:  Vitals:   06/03/17 1040 06/03/17 1132  BP: 120/75 126/81  Pulse: 66 66  Resp: 18 (!) 21  Temp:  (!) 36.4 C  SpO2: 93% (!) 89%    Last Pain:  Vitals:   06/03/17 1132  TempSrc: Oral  PainSc:                  Riccardo Dubin

## 2017-06-03 NOTE — Progress Notes (Signed)
Progress Note  Patient Name: Thomas [name] Date of Encounter: 06/03/2017  Primary Cardiologist:   Larae Grooms, MD   Subjective   Feels [place].  No SOB. No dizziness  Inpatient Medications    Scheduled Meds: . amLODipine  10 mg Oral Daily  . aspirin EC  81 mg Oral Daily  . atorvastatin  20 mg Oral q1800  . carvedilol  12.5 mg Oral BID WC  . furosemide  20 mg Oral Daily  . hydrALAZINE  25 mg Oral TID  . insulin aspart  0-5 Units Subcutaneous QHS  . insulin aspart  0-9 Units Subcutaneous TID WC  . losartan  100 mg Oral Daily  . pantoprazole  40 mg Oral Daily  . rivaroxaban  20 mg Oral Q supper  . sodium chloride flush  3 mL Intravenous Q12H  . sodium chloride flush  3 mL Intravenous Q12H   Continuous Infusions: . sodium chloride    . sodium chloride     PRN Meds: acetaminophen, dextromethorphan-guaiFENesin, hydrALAZINE, levalbuterol, morphine injection, nitroGLYCERIN, ondansetron (ZOFRAN) IV, senna-docusate, sodium chloride flush, sodium chloride flush, traMADol, zolpidem   Vital Signs    Vitals:   06/03/17 1023 06/03/17 1034 06/03/17 1040 06/03/17 1132  BP: 112/64 116/67 120/75 126/81  Pulse: 63 64 66 66  Resp: (!) 24 15 18  (!) 21  Temp:  98.1 F (36.7 C)  (!) 97.5 F (36.4 C)  TempSrc: Oral Oral  Oral  SpO2: 96% 93% 93% (!) 89%  Weight:      Height:        Intake/Output Summary (Last 24 hours) at 06/03/2017 1144 Last data filed at 06/03/2017 0648 Gross per 24 hour  Intake 928.46 ml  Output 700 ml  Net 228.46 ml   Filed Weights   06/01/17 0121 06/02/17 0401 06/03/17 0544  Weight: (!) 316 lb (143.3 kg) (!) 314 lb 13.1 oz (142.8 kg) (!) 321 lb 6.9 oz (145.8 kg)    Telemetry    NSR - Personally Reviewed  ECG    NA - Personally Reviewed  Physical Exam   GEN: No acute distress.   Neck: No  JVD Cardiac: RRR, no murmurs, rubs, or gallops.  Respiratory: Clear to auscultation bilaterally. GI: Soft, nontender, non-distended  MS: No  edema;  No deformity. Neuro:  Nonfocal  Psych: Normal affect   Labs    Chemistry Recent Labs  Lab 05/31/17 1758 06/02/17 0751 06/03/17 0338  NA 138 140 141  K 3.3* 3.4* 3.6  CL 106 108 107  CO2 22 24 25   GLUCOSE 164* 253* 237*  BUN 20 18 18   CREATININE 1.26* 1.21 1.37*  CALCIUM 8.7* 8.3* 8.3*  PROT 7.6  --   --   ALBUMIN 3.6  --   --   AST 14*  --   --   ALT 13*  --   --   ALKPHOS 67  --   --   BILITOT 0.7  --   --   GFRNONAA 58* >60 52*  GFRAA >60 >60 >60  ANIONGAP 10 8 9      Hematology Recent Labs  Lab 05/31/17 1758 06/02/17 0751 06/03/17 0338  WBC 8.0 7.7 7.3  RBC 4.39 4.16* 4.25  HGB 12.0* 11.0* 11.4*  HCT 37.0* 35.7* 36.6*  MCV 84.3 85.8 86.1  MCH 27.3 26.4 26.8  MCHC 32.4 30.8 31.1  RDW 15.0 15.1 14.9  PLT 275 275 286    Cardiac Enzymes Recent Labs  Lab 05/31/17 1758 06/01/17 0236  06/01/17 0813 06/01/17 1331  TROPONINI <0.03 <0.03 <0.03 <0.03   No results for input(s): TROPIPOC in the last 168 hours.   BNP Recent Labs  Lab 05/31/17 1758  BNP 450.6*     DDimer  Recent Labs  Lab 06/01/17 0236  DDIMER 0.58*     Radiology    No results found.  Cardiac Studies   NA  Patient Profile     67 y.o. male with a PMH of chronic diastolic CHF, HTN, HLD, DM type 2, CKD stage III, History of PE/DVT on xarelto, OSA, morbid obesity, GERD, and prostate cancer s/p seed implantationwho is being seen for the evaluation ofchest painat the request of Dr. Posey Pronto.  Assessment & Plan    CHEST PAIN:  Plan out patient Myoview.  Atypical symptoms.    ATRIAL FIB:  DCCV today.   Continue anticoagulation and increased beta blocker.   Stop IV Cardizem  HTN:  BP is well controlled.  Continue meds as listed.    For questions or updates, please contact [place] Please consult [email or web address] for contact info under Cardiology/STEMI.   Signed, Minus Breeding, MD  06/03/2017, 11:44 AM

## 2017-06-03 NOTE — Discharge Summary (Signed)
Physician Discharge Summary  Thomas [name] [id number] DOB: 1950/05/10 DOA: 05/31/2017  PCP: [name], NP  Admit date: 05/31/2017 Discharge date: 06/03/2017  Admitted From: home Disposition:  home  Recommendations for Outpatient Follow-up:  1. Follow up with cardiology as scheduled  Home Health: none Equipment/Devices: none  Discharge Condition: stable CODE STATUS: Full code Diet recommendation: heart healthy   HPI: Per Dr. Daneen Schick is a 67 y.o. male with medical history significant of hypertension, hyperlipidemia, diabetes mellitus, OSA, GERD, DVT and PE onXarelto, CKD-III, dCHF and prostate cancer, who presents with left-sided intermittent chest pain for 2 days. Pt state that he has been having intermittent chest pain in the past 2 days. His chest is located in the left side of chest, intermittent, moderate, nonradiating. It is associated with SOB, which is exertional. Patient has cough with little clear or colored mucus production. No recent long distant traveling. Patient states that he is taking xarelto consistently, did not miss any doses recently. Patient states that he has gained 7 pounds in the past 3 months. Denies nausea, vomiting, diarrhea, abdominal pain, symptoms of UTI or unilateral weakness. ED Course: pt was found to have newA. fib with RVR, negative troponin, BNP 450.6, potassium 3.3, creatinine 1.26, no fever, soft blood pressure,O2 sat93% on 2 L nasal cannula oxygen. Chest x-ray showed mild pulmonary edema. Patient is admitted to stepdown as inpatient. EDP called Card, Dr. Aundra Dubin, but will still need formal consult if needed.  Hospital Course: New onset of atrial fibrillation with RVR ([name])  -patient was admitted to the hospital with A fib with RVR, new onset, likely exacerbated by acute on chronic CHF. Cardiology was consulted and evaluated patient. His Coreg dose was increased for rate control, and eventually patient underwent  cardioversion. He remained in sinus rhythm, stable, had no further chest pain or dyspnea and was discharged home in stable condition.  Acute on chronic combined systolic and diastolic EML:JQGBEEF has weight gainof7 pounds recently, elevated BNP 450, pulmonary edema on chest x-ray, leg edema, clinically consistent with CHF exacerbation. Treated with IV Lasix, now changing to p.o. Lasix.   Chest pain: Atypical, pleuritic in nature. Serial troponins are negative.  Outpatient Myoview planned. 2d echo shows EF of 45-50% with diffuse hypokinesis. Hx of PE and left leg DVT: -on Xarelto do not think patient needs further workup to rule out a new PE. HTN  -Continue home medications:Coreg, hydralazine, Cozaar CKD-III - overall stable Type 2 diabetes mellitus with chronic kidney disease, without long-term current use of insulin:Last A1c7.7,notcontroled. Patient is taking glipizide and metforminat home. Patient defers any insulin use right now OSA -CPAP Hyperlipidemia with target LDL less than 100 -lipitor  Discharge Diagnoses:  Principal Problem:   Atrial fibrillation with RVR ([name]) Active Problems:   HTN (hypertension)   Hyperlipidemia with target LDL less than 100   Severe obesity (BMI >= 40) (HCC)   Type 2 diabetes mellitus with chronic kidney disease, without long-term current use of insulin (H[facility])   Pulmonary embolism (H[facility])   Sleep apnea   DVT, lower extremity, proximal, acute, left (H[facility])   Chest pain   New onset atrial fibrillation (H[facility])   CKD (chronic kidney disease), stage III ([name])   Acute on chronic diastolic CHF (congestive heart failure) ([name])     Discharge Instructions   Allergies as of 06/03/2017      Reactions   Lisinopril Cough      Medication List    TAKE these medications  AMBULATORY NON FORMULARY MEDICATION Full Face CPAP Mask with Tubing Dx: G47.30   amLODipine 10 MG tablet Commonly known as:  NORVASC Take 1 tablet (10 mg total) by mouth daily. For high  blood pressure   aspirin EC 81 MG tablet Take 81 mg by mouth daily.   atorvastatin 20 MG tablet Commonly known as:  LIPITOR Take one tablet by mouth once daily for cholesterol   carvedilol 12.5 MG tablet Commonly known as:  COREG Take 1 tablet (12.5 mg total) by mouth 2 (two) times daily with a meal. What changed:    medication strength  how much to take  additional instructions   furosemide 20 MG tablet Commonly known as:  LASIX Take 1 tablet (20 mg total) by mouth daily. Start taking on:  06/04/2017   glipiZIDE 5 MG tablet Commonly known as:  GLUCOTROL Take 1 tablet (5 mg total) by mouth daily before breakfast.   hydrALAZINE 25 MG tablet Commonly known as:  APRESOLINE Take 1 tablet (25 mg total) by mouth 3 (three) times daily.   losartan 100 MG tablet Commonly known as:  COZAAR Take 1 tablet (100 mg total) by mouth daily.   metFORMIN 500 MG tablet Commonly known as:  GLUCOPHAGE TAKE 1 TABLET BY MOUTH 2 TIMES A DAY WITH A MEAL for diabetes   omeprazole 20 MG capsule Commonly known as:  PRILOSEC Take 1 capsule (20 mg total) by mouth as needed.   potassium chloride SA 20 MEQ tablet Commonly known as:  K-DUR,KLOR-CON Take 1 tablet (20 mEq total) by mouth 2 (two) times daily.   rivaroxaban 20 MG Tabs tablet Commonly known as:  XARELTO Take 1 tablet (20 mg total) by mouth daily with supper.   senna-docusate 8.6-50 MG tablet Commonly known as:  SENOKOT S Take 1 tablet by mouth at bedtime as needed.   traMADol 50 MG tablet Commonly known as:  ULTRAM Take 1 tablet (50 mg total) by mouth every 6 (six) hours as needed. for pain   TRUE METRIX BLOOD GLUCOSE TEST test strip Generic drug:  glucose blood CHECK BLOOD SUGAR TWICE PER DAY   TRUEPLUS LANCETS 30G Misc USE TO TEST BLOOD SUGAR 2 TIMES A DAY      Follow-up Information    Jettie Booze, MD. Schedule an appointment as soon as possible for a visit in 2 week(s).   Specialties:  Cardiology,  Radiology, Interventional Cardiology Contact information: [address] [place] [address] [phone]           Consultations:  Cardiology   Procedures/Studies:  2D echo  Study Conclusions - Left ventricle: The cavity size was normal. Systolic function was mildly reduced. The estimated ejection fraction was in the range of 45% to 50%. Diffuse hypokinesis. The study is not technically sufficient to allow evaluation of LV diastolic function. - Aortic valve: Trileaflet; mildly thickened, mildly calcified leaflets. There was trivial regurgitation. - Left atrium: The atrium was mildly dilated. - Pulmonary arteries: Systolic pressure was mildly increased. PA peak pressure: 35 mm Hg (S).   DCCV  Dg Chest 2 View  Result Date: 05/31/2017 CLINICAL DATA:  Chest pain with shortness of breath EXAM: CHEST - 2 VIEW COMPARISON:  04/02/2017 FINDINGS: No pleural effusion or focal airspace disease. Borderline to mild cardiomegaly with vascular congestion and mild pulmonary edema. No pneumothorax. IMPRESSION: Borderline heart size with vascular congestion and mild pulmonary edema. Electronically Signed   By: Donavan Foil M.D.   On: 05/31/2017 18:48  Xr Knee 3 View Right  Result Date: 05/18/2017 X-rays show a well-seated prosthesis without evidence of subsidence or osteolysis.    Subjective: - no chest pain, shortness of breath, no abdominal pain, nausea or vomiting.   Discharge Exam: Vitals:   06/03/17 1040 06/03/17 1132  BP: 120/75 126/81  Pulse: 66 66  Resp: 18 (!) 21  Temp:  (!) 97.5 F (36.4 C)  SpO2: 93% (!) 89%    General: Pt is alert, awake, not in acute distress Cardiovascular: RRR, S1/S2 +, no rubs, no gallops Respiratory: CTA bilaterally, no wheezing, no rhonchi Abdominal: Soft, NT, ND, bowel sounds + Extremities: no edema, no cyanosis    The results of significant diagnostics from this hospitalization (including imaging, microbiology,  ancillary and laboratory) are listed below for reference.     Microbiology: Recent Results (from the past 240 hour(s))  Urine culture     Status: Abnormal   Collection Time: 05/31/17 10:13 PM  Result Value Ref Range Status   Specimen Description   Final    URINE, CLEAN CATCH Performed at [facility], [address] [place], [place] [address]    Special Requests   Final    NONE Performed at [facility], [address] [place], [place] [address]    Culture 70,000 COLONIES/mL [facility] MORGANII (A)  Final   Report Status 06/03/2017 FINAL  Final   Organism ID, Bacteria MORGANELLA MORGANII (A)  Final      Susceptibility   Morganella morganii - MIC*    AMPICILLIN >=32 RESISTANT Resistant     CEFAZOLIN >=64 RESISTANT Resistant     CEFTRIAXONE <=1 SENSITIVE Sensitive     CIPROFLOXACIN <=0.25 SENSITIVE Sensitive     GENTAMICIN <=1 SENSITIVE Sensitive     IMIPENEM 4 SENSITIVE Sensitive     NITROFURANTOIN 128 RESISTANT Resistant     TRIMETH/SULFA <=20 SENSITIVE Sensitive     AMPICILLIN/SULBACTAM >=32 RESISTANT Resistant     PIP/TAZO <=4 SENSITIVE Sensitive     * 70,000 COLONIES/mL MORGANELLA MORGANII  MRSA PCR Screening     Status: None   Collection Time: 06/01/17  1:47 AM  Result Value Ref Range Status   MRSA by PCR NEGATIVE NEGATIVE Final    Comment:        The GeneXpert MRSA Assay (FDA approved for NASAL specimens only), is one component of a comprehensive MRSA colonization surveillance program. It is not intended to diagnose MRSA infection nor to guide or monitor treatment for MRSA infections. Performed at [facility], [place] [address] [place], [place] [address]      Labs: BNP (last 3 results) Recent Labs    06/13/16 1105 05/31/17 1758  BNP 156.8* 825.0*   Basic Metabolic Panel: Recent Labs  Lab 05/31/17 1758 06/01/17 0236 06/02/17 0751 06/03/17 0338  NA 138  --  140 141  K 3.3*  --  3.4* 3.6  CL 106  --  108 107  CO2 22   --  24 25  GLUCOSE 164*  --  253* 237*  BUN 20  --  18 18  CREATININE 1.26*  --  1.21 1.37*  CALCIUM 8.7*  --  8.3* 8.3*  MG  --  1.7  --  2.1   Liver Function Tests: Recent Labs  Lab 05/31/17 1758  AST 14*  ALT 13*  ALKPHOS 67  BILITOT 0.7  PROT 7.6  ALBUMIN 3.6   No results for input(s): LIPASE, AMYLASE in the last  168 hours. No results for input(s): AMMONIA in the last 168 hours. CBC: Recent Labs  Lab 05/31/17 1758 06/02/17 0751 06/03/17 0338  WBC 8.0 7.7 7.3  NEUTROABS 5.8  --   --   HGB 12.0* 11.0* 11.4*  HCT 37.0* 35.7* 36.6*  MCV 84.3 85.8 86.1  PLT 275 275 286   Cardiac Enzymes: Recent Labs  Lab 05/31/17 1758 06/01/17 0236 06/01/17 0813 06/01/17 1331  TROPONINI <0.03 <0.03 <0.03 <0.03   BNP: Invalid input(s): POCBNP CBG: Recent Labs  Lab 06/02/17 1140 06/02/17 1633 06/02/17 2042 06/03/17 0740 06/03/17 1125  GLUCAP 198* 201* 225* 230* 216*   D-Dimer Recent Labs    06/01/17 0236  DDIMER 0.58*   Hgb A1c Recent Labs    06/01/17 0236  HGBA1C 7.1*   Lipid Profile Recent Labs    06/01/17 0236  CHOL 127  HDL 44  LDLCALC 64  TRIG 96  CHOLHDL 2.9   Thyroid function studies Recent Labs    06/01/17 0236  TSH 1.471   Anemia work up No results for input(s): VITAMINB12, FOLATE, FERRITIN, TIBC, IRON, RETICCTPCT in the last 72 hours. Urinalysis    Component Value Date/Time   COLORURINE YELLOW 05/31/2017 2113   APPEARANCEUR CLEAR 05/31/2017 2113   LABSPEC 1.020 05/31/2017 2113   PHURINE 6.0 05/31/2017 2113   GLUCOSEU NEGATIVE 05/31/2017 2113   HGBUR SMALL (A) 05/31/2017 2113   BILIRUBINUR NEGATIVE 05/31/2017 2113   BILIRUBINUR small 10/11/2014 [place] 05/31/2017 2113   PROTEINUR NEGATIVE 05/31/2017 2113   UROBILINOGEN 0.2 10/11/2014 1152   UROBILINOGEN 0.2 05/19/2014 1412   NITRITE NEGATIVE 05/31/2017 2113   LEUKOCYTESUR LARGE (A) 05/31/2017 2113   Sepsis Labs Invalid input(s): PROCALCITONIN,  WBC,   LACTICIDVEN   Time coordinating discharge: 40 minutes  SIGNED:  Marzetta Board, MD  [facility] 06/03/2017, 2:10 PM Pager [phone]  If 7PM-7AM, please contact night-coverage [email or web address] Password [email or web address]

## 2017-06-04 ENCOUNTER — Telehealth: Payer: Self-pay | Admitting: Physician Assistant

## 2017-06-04 ENCOUNTER — Telehealth: Payer: Self-pay | Admitting: Interventional Cardiology

## 2017-06-04 ENCOUNTER — Encounter (HOSPITAL_COMMUNITY): Payer: Self-pay | Admitting: Cardiology

## 2017-06-04 MED FILL — FUROSEMIDE 20 MG TABS: 20 | 30 days supply | Qty: 30 | Fill #0

## 2017-06-04 NOTE — Telephone Encounter (Signed)
New message  Pt verbalzied that he is calling because he wants to be released from care   Of Dr.Varanasi and to become a pt of Dr.Hochrein   Are both providers okay?  He will also keep the appt with Bhagat on 06/15/2017 @ 11:00am

## 2017-06-04 NOTE — Telephone Encounter (Signed)
OK 

## 2017-06-04 NOTE — Telephone Encounter (Signed)
OK with me.

## 2017-06-04 NOTE — Telephone Encounter (Signed)
Returned call to patient and made him aware that he should be following a low sodium, low carb diet. Patient states that he was recently admitted to the hospital for new onset Afib. Instructed patient to avoid smoking, caffeine or alcohol, or using any diet pills or stimulants. Also made patient aware that he should use his CPAP for his OSA and continue to try and get regular exercise. Advised patient to continue taking his Xarelto. Patient verbalized understanding and thanked me for the call.

## 2017-06-04 NOTE — Telephone Encounter (Signed)
Patient calling,  States that he eats a bowl of oatmeal with a tablespoon of peanut butter. Patient would like to verify if this is okay.

## 2017-06-07 ENCOUNTER — Telehealth: Payer: Self-pay | Admitting: *Deleted

## 2017-06-07 DIAGNOSIS — G4733 Obstructive sleep apnea (adult) (pediatric): Secondary | ICD-10-CM

## 2017-06-07 NOTE — Telephone Encounter (Signed)
Patient called and stated that he was in the hospital last week for AFIB and they had to shock his heart. Told him to be using his CPAP but patient is needs a Rx for a CPAP mask. Patient would like to pick up a Rx for a CPAP Mask. Please Advise.

## 2017-06-07 NOTE — Telephone Encounter (Signed)
Okay we can write for that due to OSA, can we fax this over to where he needs it?

## 2017-06-07 NOTE — Telephone Encounter (Signed)
I called [facility] and spoke with Trish and she stated she doesn't have anything on file where patient got his face mask through them.   I called patient he said that wasn't true, that's where his mask came from. Stated that it is a Large Full size Mask and that's what the Rx needs to be written for. Stated he wants to pick it up today.

## 2017-06-07 NOTE — Telephone Encounter (Signed)
Patient notified. Printed and faxed to [facility].

## 2017-06-07 NOTE — Telephone Encounter (Signed)
Who normally supplies his CPAP supplies? I am looking back and can not tell in epic? Does Cardiology order CPAP for him?

## 2017-06-07 NOTE — Telephone Encounter (Signed)
Stated that he was put on CPAP through [facility] 10 years ago. Stated [facility]. Stated that you had prescribed his last face mask through [facility].

## 2017-06-10 DIAGNOSIS — S838X9A Sprain of other specified parts of unspecified knee, initial encounter: Secondary | ICD-10-CM | POA: Diagnosis not present

## 2017-06-10 DIAGNOSIS — G4730 Sleep apnea, unspecified: Secondary | ICD-10-CM | POA: Diagnosis not present

## 2017-06-10 DIAGNOSIS — G4733 Obstructive sleep apnea (adult) (pediatric): Secondary | ICD-10-CM | POA: Diagnosis not present

## 2017-06-10 DIAGNOSIS — M1710 Unilateral primary osteoarthritis, unspecified knee: Secondary | ICD-10-CM | POA: Diagnosis not present

## 2017-06-15 ENCOUNTER — Ambulatory Visit: Payer: Medicare HMO | Admitting: Physician Assistant

## 2017-06-15 ENCOUNTER — Encounter: Payer: Self-pay | Admitting: Physician Assistant

## 2017-06-15 ENCOUNTER — Telehealth: Payer: Self-pay

## 2017-06-15 VITALS — BP 132/68 | HR 60 | Ht 69.0 in | Wt 320.1 lb

## 2017-06-15 DIAGNOSIS — I200 Unstable angina: Secondary | ICD-10-CM | POA: Diagnosis not present

## 2017-06-15 DIAGNOSIS — E782 Mixed hyperlipidemia: Secondary | ICD-10-CM | POA: Diagnosis not present

## 2017-06-15 DIAGNOSIS — I5022 Chronic systolic (congestive) heart failure: Secondary | ICD-10-CM | POA: Diagnosis not present

## 2017-06-15 DIAGNOSIS — I480 Paroxysmal atrial fibrillation: Secondary | ICD-10-CM | POA: Diagnosis not present

## 2017-06-15 MED ORDER — UNABLE TO FIND: 11 refills | Status: DC

## 2017-06-15 NOTE — H&P (View-Only) (Signed)
Cardiology Office Note    Date:  06/15/2017   ID:  Thomas [name], DOB 1950/08/21, MRN [id number]  PCP:  [name], NP  Cardiologist:  Dr. Irish Lack  Chief Complaint: Hospital follow up for afib  History of Present Illness:   Thomas [name] is a 67 y.o. male with a PMH of chronic diastolic CHF, HTN, HLD, DM type 2, CKD stage III, History of PE/DVT on xarelto, OSA, morbid obesity, GERD, prostate cancer s/p seed implantation and recent admission for atrial fibrillation presents for follow-up.  His last LHC occurred in 2008 and revealed no significant CAD. He has not had an ischemic evaluation since that time.  Admitted 4/15-4/18 for afib RVR. Initially presented for chest pain. afib left due to CHF exacerbation. Increased coreg and underwent successful cardioversion. Echo 06/02/17 showed LVEF of 45-50% with diffuse hypokinesis (LVEF reduced from prior). Chest pain felt atypical and recommended outpatient myoview.   Here today for follow up.  He used to walk but now has dyspnea with activity, limiting his exertion. He has intermitted substernal chest pressure. Last for 5 minutes with self resolution.  His chest pressure occurs with and without exertion. Has intermittent orthopnea and LE edema, now improving on low dose daily lasix. Denies palpitations, dizziness, syncope. On Xarelto chronically due to hx of PE. No bleeding issue.   CT angio of chest 04/02/2017 IMPRESSION: 1. Negative for acute pulmonary embolus. 2. Aortic atherosclerosis and atherosclerotic calcifications within the LAD and left circumflex coronary arteries. 3. Gallstone.  Past Medical History:  Diagnosis Date  . Acute on chronic diastolic CHF (congestive heart failure) ([place]) 06/01/2017  . Arthritis   . Cancer [facility]) 2010   Prostate  . Chronic kidney disease   . Diabetes mellitus   . Diabetic neuropathy (HCC)    feet  . DVT (deep venous thrombosis) ([place])   . Genetic testing 06/22/2016   Mr.  Manzo underwent genetic counseling and testing for hereditary cancer syndromes on 05/14/2016. His results were negative for mutations in all 46 genes analyzed by [employer]'s 46-gene Common Hereditary Cancers Panel. Genes analyzed include: APC, ATM, AXIN2, BARD1, BMPR1A, BRCA1, BRCA2, BRIP1, CDH1, CDKN2A, CHEK2, CTNNA1, DICER1, EPCAM, GREM1, HOXB13, KIT, MEN1, MLH1, MSH2, MSH3, MSH6, MUTYH, NB  . GERD (gastroesophageal reflux disease)   . Hypertension   . PE (pulmonary thromboembolism) ([place])   . Pneumonia   . Sleep apnea    not wearing CPAP    Past Surgical History:  Procedure Laterality Date  . CARDIOVERSION N/A 06/03/2017   Procedure: CARDIOVERSION;  Surgeon: Jerline Pain, MD;  Location: [facility] ENDOSCOPY;  Service: Cardiovascular;  Laterality: N/A;  . COLONOSCOPY    . HERNIA REPAIR    . KNEE SURGERY    . MULTIPLE TOOTH EXTRACTIONS    . PROSTATE SURGERY    . RADIOACTIVE SEED IMPLANT    . SHOULDER SURGERY    . TOTAL KNEE ARTHROPLASTY Right 11/19/2016   Procedure: RIGHT TOTAL KNEE ARTHROPLASTY;  Surgeon: Leandrew Koyanagi, MD;  Location: [place];  Service: Orthopedics;  Laterality: Right;  . uretha surgery-2014      Current Medications: Prior to Admission medications   Medication Sig Start Date End Date Taking? Authorizing Provider  AMBULATORY NON FORMULARY MEDICATION Full Face CPAP Mask with Tubing Dx: G47.30 11/12/16   Reed, Tiffany L, DO  amLODipine (NORVASC) 10 MG tablet Take 1 tablet (10 mg total) by mouth daily. For high blood pressure 05/06/17   [name], NP  aspirin  EC 81 MG tablet Take 81 mg by mouth daily.    [provider]  atorvastatin (LIPITOR) 20 MG tablet Take one tablet by mouth once daily for cholesterol 05/06/17   [name], NP  carvedilol (COREG) 12.5 MG tablet Take 1 tablet (12.5 mg total) by mouth 2 (two) times daily with a meal. 06/03/17   Gherghe, Vella Redhead, MD  furosemide (LASIX) 20 MG tablet Take 1 tablet (20 mg total) by mouth daily. 06/04/17    Caren Griffins, MD  glipiZIDE (GLUCOTROL) 5 MG tablet Take 1 tablet (5 mg total) by mouth daily before breakfast. 05/06/17   [name], NP  hydrALAZINE (APRESOLINE) 25 MG tablet Take 1 tablet (25 mg total) by mouth 3 (three) times daily. 05/06/17   [name], NP  losartan (COZAAR) 100 MG tablet Take 1 tablet (100 mg total) by mouth daily. 12/14/16   [name], NP  metFORMIN (GLUCOPHAGE) 500 MG tablet TAKE 1 TABLET BY MOUTH 2 TIMES A DAY WITH A MEAL for diabetes 01/21/16   Estill Dooms, MD  omeprazole (PRILOSEC) 20 MG capsule Take 1 capsule (20 mg total) by mouth as needed. 12/14/16   [name], NP  potassium chloride SA (K-DUR,KLOR-CON) 20 MEQ tablet Take 1 tablet (20 mEq total) by mouth 2 (two) times daily. 12/14/16   [name], NP  rivaroxaban (XARELTO) 20 MG TABS tablet Take 1 tablet (20 mg total) by mouth daily with supper. 01/23/16   Volanda Napoleon, MD  senna-docusate (SENOKOT S) 8.6-50 MG tablet Take 1 tablet by mouth at bedtime as needed. 11/19/16   Leandrew Koyanagi, MD  traMADol (ULTRAM) 50 MG tablet Take 1 tablet (50 mg total) by mouth every 6 (six) hours as needed. for pain 05/06/17   [name], NP  TRUE METRIX BLOOD GLUCOSE TEST test strip CHECK BLOOD SUGAR TWICE PER DAY 10/24/15   [name], NP  TRUEPLUS LANCETS 30G MISC USE TO TEST BLOOD SUGAR 2 TIMES A DAY 09/12/14   [name], NP   Allergies:   Lisinopril   Social History   Socioeconomic History  . Marital status: Married    Spouse name: Not on file  . Number of children: Not on file  . Years of education: Not on file  . Highest education level: Not on file  Occupational History  . Not on file  Social Needs  . Financial resource strain: Not on file  . Food insecurity:    Worry: Not on file    Inability: Not on file  . Transportation needs:    Medical: Not on file    Non-medical: Not on file  Tobacco Use  . Smoking status: Never Smoker  . Smokeless  tobacco: Never Used  Substance and Sexual Activity  . Alcohol use: No    Comment: never  . Drug use: No  . Sexual activity: Yes  Lifestyle  . Physical activity:    Days per week: Not on file    Minutes per session: Not on file  . Stress: Not on file  Relationships  . Social connections:    Talks on phone: Not on file    Gets together: Not on file    Attends religious service: Not on file    Active member of club or organization: Not on file    Attends meetings of clubs or organizations: Not on file    Relationship status: Not on file  Other Topics  Concern  . Not on file  Social History Narrative  . Not on file     Family History:  The patient's family history includes Breast cancer (age of onset: 45) in his sister; Breast cancer (age of onset: 1) in his maternal aunt; Breast cancer (age of onset: 63) in his mother; Cancer (age of onset: 50) in his sister; Cancer (age of onset: 3) in his maternal uncle; Cervical cancer (age of onset: 50) in his other; Leukemia (age of onset: 62) in his brother; Lung cancer in his maternal uncle; Other (age of onset: 18) in his brother; Prostate cancer in his brother and paternal uncle.   ROS:   Please see the history of present illness.    ROS All other systems reviewed and are negative.   PHYSICAL EXAM:   VS:  BP 132/68   Pulse 60   Ht _0  (1.753 m)   Wt (!) 320 lb 1.9 oz (145.2 kg)   BMI 47.27 kg/m    GEN: Well nourished, well developed, in no acute distress  HEENT: normal  Neck: no JVD, carotid bruits, or masses Cardiac:RRR; no murmurs, rubs, or gallops, Trace BL LE edema  Respiratory:  clear to auscultation bilaterally, normal work of breathing GI: soft, nontender, nondistended, + BS MS: no deformity or atrophy  Skin: warm and dry, no rash Neuro:  Alert and Oriented x 3, Strength and sensation are intact Psych: euthymic mood, full affect  Wt Readings from Last 3 Encounters:  06/15/17 (!) 320 lb 1.9 oz (145.2 kg)  06/03/17  (!) 321 lb 6.9 oz (145.8 kg)  05/13/17 (!) 311 lb 1.9 oz (141.1 kg)      Studies/Labs Reviewed:   EKG:  EKG is ordered today.  The ekg ordered today demonstrates NSR  Recent Labs: 05/31/2017: ALT 13; B Natriuretic Peptide 450.6 06/01/2017: TSH 1.471 06/03/2017: BUN 18; Creatinine, Ser 1.37; Hemoglobin 11.4; Magnesium 2.1; Platelets 286; Potassium 3.6; Sodium 141   Lipid Panel    Component Value Date/Time   CHOL 127 06/01/2017 0236   CHOL 153 03/08/2015 0834   TRIG 96 06/01/2017 0236   HDL 44 06/01/2017 0236   HDL 52 03/08/2015 0834   CHOLHDL 2.9 06/01/2017 0236   VLDL 19 06/01/2017 0236   LDLCALC 64 06/01/2017 0236   LDLCALC 80 05/04/2017 0812    Additional studies/ records that were reviewed today include:   Echocardiogram: 06/02/17 Study Conclusions  - Left ventricle: The cavity size was normal. Systolic function was   mildly reduced. The estimated ejection fraction was in the range   of 45% to 50%. Diffuse hypokinesis. The study is not technically   sufficient to allow evaluation of LV diastolic function. - Aortic valve: Trileaflet; mildly thickened, mildly calcified   leaflets. There was trivial regurgitation. - Left atrium: The atrium was mildly dilated. - Pulmonary arteries: Systolic pressure was mildly increased. PA   peak pressure: 35 mm Hg (S).   ASSESSMENT & PLAN:    1. Paroxysmal atrial fibrillation -Maintaining sinus rhythm.  Continue Coreg at 12.5 mg twice daily.  Xarelto for anticoagulation.  2. Chest pain with moderate risk for cardiac etiology - Felt atypical during admission.  However his symptoms seems mixed.  Recently noted dyspnea on exertion, limiting his activity.  He also has substernal chest pressure with and without exertion.  His symptoms is difficult to differentiate.  Not typical of acid reflux.  I have reviewed with Dr. Irish Lack.  Given symptoms concerning for angina and newly reduced  LVEF will get left and right cardiac catheterization  after 4 weeks of anticoagulation post recent cardioversion.  3. HTN - Continue current therapy.   3. Chronic combined CHF - Echo 06/02/17 showed LVEF of 45-50% with diffuse hypokinesis. This study is not technically sufficient to allow evaluation of LV diastolic function. (LVEF was 55-60% on 10/2017with grade 2 DD). Likely tachy mediated given recent afib RVR. However can not r/o ischemic etiology.  R & L cath as above. Continue coreg, losartan and lasix at current dose.   Reviewed with Pharmacy and Dr. Irish Lack, hold Xaralto 24 hours prior to cath. The patient understands that risks include but are not limited to stroke (1 in 1000), death (1 in 35), kidney failure [usually temporary] (1 in 500), bleeding (1 in 200), allergic reaction [possibly serious] (1 in 200), and agrees to proceed.    Medication Adjustments/Labs and Tests Ordered: Current medicines are reviewed at length with the patient today.  Concerns regarding medicines are outlined above.  Medication changes, Labs and Tests ordered today are listed in the Patient Instructions below. Patient Instructions  Medication Instructions: Your physician recommends that you continue on your current medications as directed. Please refer to the Current Medication list given to you today.   Labwork: One week before procedure   Procedures/Testing: Your physician has requested that you have a cardiac catheterization. Cardiac catheterization is used to diagnose and/or treat various heart conditions. Doctors may recommend this procedure for a number of different reasons. The most common reason is to evaluate chest pain. Chest pain can be a symptom of coronary artery disease (CAD), and cardiac catheterization can show whether plaque is narrowing or blocking your heart's arteries. This procedure is also used to evaluate the valves, as well as measure the blood flow and oxygen levels in different parts of your heart. For further information please  visit [email or web address]. Please follow instruction sheet, as given.  I WILL CALL YOU WITH INSTRUCTIONS   Follow-Up:Your physician recommends that you schedule a follow-up appointment in: 2 weeks after your procedure    Any Additional Special Instructions Will Be Listed Below (If Applicable).   Coronary Angiogram With Stent Coronary angiogram with stent placement is a procedure to widen or open a narrow blood vessel of the heart (coronary artery). Arteries may become blocked by cholesterol buildup (plaques) in the lining of the wall. When a coronary artery becomes partially blocked, blood flow to that area decreases. This may lead to chest pain or a heart attack (myocardial infarction). A stent is a small piece of metal that looks like mesh or a spring. Stent placement may be done as treatment for a heart attack or right after a coronary angiogram in which a blocked artery is found. Let your health care provider know about:  Any allergies you have.  All medicines you are taking, including vitamins, herbs, eye drops, creams, and over-the-counter medicines.  Any problems you or family members have had with anesthetic medicines.  Any blood disorders you have.  Any surgeries you have had.  Any medical conditions you have.  Whether you are pregnant or may be pregnant. What are the risks? Generally, this is a safe procedure. However, problems may occur, including:  Damage to the heart or its blood vessels.  A return of blockage.  Bleeding, infection, or bruising at the insertion site.  A collection of blood under the skin (hematoma) at the insertion site.  A blood clot in another part of the body.  Kidney injury.  Allergic reaction to the dye or contrast that is used.  Bleeding into the abdomen (retroperitoneal bleeding).  What happens before the procedure? Staying hydrated Follow instructions from your health care provider about hydration, which may include:  Up to 2  hours before the procedure - you may continue to drink clear liquids, such as water, clear fruit juice, black coffee, and plain tea.  Eating and drinking restrictions Follow instructions from your health care provider about eating and drinking, which may include:  8 hours before the procedure - stop eating heavy meals or foods such as meat, fried foods, or fatty foods.  6 hours before the procedure - stop eating light meals or foods, such as toast or cereal.  2 hours before the procedure - stop drinking clear liquids.  Ask your health care provider about:  Changing or stopping your regular medicines. This is especially important if you are taking diabetes medicines or blood thinners.  Taking medicines such as ibuprofen. These medicines can thin your blood. Do not take these medicines before your procedure if your health care provider instructs you not to. Generally, aspirin is recommended before a procedure of passing a small, thin tube (catheter) through a blood vessel and into the heart (cardiac catheterization).  What happens during the procedure?  An IV tube will be inserted into one of your veins.  You will be given one or more of the following: ? A medicine to help you relax (sedative). ? A medicine to numb the area where the catheter will be inserted into an artery (local anesthetic).  To reduce your risk of infection: ? Your health care team will wash or sanitize their hands. ? Your skin will be washed with soap. ? Hair may be removed from the area where the catheter will be inserted.  Using a guide wire, the catheter will be inserted into an artery. The location may be in your groin, in your wrist, or in the fold of your arm (near your elbow).  A type of X-ray (fluoroscopy) will be used to help guide the catheter to the opening of the arteries in the heart.  A dye will be injected into the catheter, and X-rays will be taken. The dye will help to show where any narrowing or  blockages are located in the arteries.  A tiny wire will be guided to the blocked spot, and a balloon will be inflated to make the artery wider.  The stent will be expanded and will crush the plaques into the wall of the vessel. The stent will hold the area open and improve the blood flow. Most stents have a drug coating to reduce the risk of the stent narrowing over time.  The artery may be made wider using a drill, laser, or other tools to remove plaques.  When the blood flow is better, the catheter will be removed. The lining of the artery will grow over the stent, which stays where it was placed. This procedure may vary among health care providers and hospitals. What happens after the procedure?  If the procedure is done through the leg, you will be kept in bed lying flat for about 6 hours. You will be instructed to not bend and not cross your legs.  The insertion site will be checked frequently.  The pulse in your foot or wrist will be checked frequently.  You may have additional blood tests, X-rays, and a test that records the electrical activity of your heart (electrocardiogram, or ECG). This information  is not intended to replace advice given to you by your health care provider. Make sure you discuss any questions you have with your health care provider. Document Released: 08/09/2002 Document Revised: 10/03/2015 Document Reviewed: 09/08/2015 Elsevier Interactive Patient Education  [employer].    If you need a refill on your cardiac medications before your next appointment, please call your pharmacy.      Jarrett Soho, [place]  06/15/2017 12:23 PM    [facility] [address], [place], [place]  [address] Phone: [phone]; Fax: [phone]

## 2017-06-15 NOTE — Progress Notes (Addendum)
Cardiology Office Note    Date:  06/15/2017   ID:  Thomas [name], DOB 1950/08/21, MRN [id number]  PCP:  [name], NP  Cardiologist:  Dr. Irish Lack  Chief Complaint: Hospital follow up for afib  History of Present Illness:   Thomas [name] is a 67 y.o. male with a PMH of chronic diastolic CHF, HTN, HLD, DM type 2, CKD stage III, History of PE/DVT on xarelto, OSA, morbid obesity, GERD, prostate cancer s/p seed implantation and recent admission for atrial fibrillation presents for follow-up.  His last LHC occurred in 2008 and revealed no significant CAD. He has not had an ischemic evaluation since that time.  Admitted 4/15-4/18 for afib RVR. Initially presented for chest pain. afib left due to CHF exacerbation. Increased coreg and underwent successful cardioversion. Echo 06/02/17 showed LVEF of 45-50% with diffuse hypokinesis (LVEF reduced from prior). Chest pain felt atypical and recommended outpatient myoview.   Here today for follow up.  He used to walk but now has dyspnea with activity, limiting his exertion. He has intermitted substernal chest pressure. Last for 5 minutes with self resolution.  His chest pressure occurs with and without exertion. Has intermittent orthopnea and LE edema, now improving on low dose daily lasix. Denies palpitations, dizziness, syncope. On Xarelto chronically due to hx of PE. No bleeding issue.   CT angio of chest 04/02/2017 IMPRESSION: 1. Negative for acute pulmonary embolus. 2. Aortic atherosclerosis and atherosclerotic calcifications within the LAD and left circumflex coronary arteries. 3. Gallstone.  Past Medical History:  Diagnosis Date  . Acute on chronic diastolic CHF (congestive heart failure) ([place]) 06/01/2017  . Arthritis   . Cancer [facility]) 2010   Prostate  . Chronic kidney disease   . Diabetes mellitus   . Diabetic neuropathy (HCC)    feet  . DVT (deep venous thrombosis) ([place])   . Genetic testing 06/22/2016   Mr.  Manzo underwent genetic counseling and testing for hereditary cancer syndromes on 05/14/2016. His results were negative for mutations in all 46 genes analyzed by [employer]'s 46-gene Common Hereditary Cancers Panel. Genes analyzed include: APC, ATM, AXIN2, BARD1, BMPR1A, BRCA1, BRCA2, BRIP1, CDH1, CDKN2A, CHEK2, CTNNA1, DICER1, EPCAM, GREM1, HOXB13, KIT, MEN1, MLH1, MSH2, MSH3, MSH6, MUTYH, NB  . GERD (gastroesophageal reflux disease)   . Hypertension   . PE (pulmonary thromboembolism) ([place])   . Pneumonia   . Sleep apnea    not wearing CPAP    Past Surgical History:  Procedure Laterality Date  . CARDIOVERSION N/A 06/03/2017   Procedure: CARDIOVERSION;  Surgeon: Jerline Pain, MD;  Location: [facility] ENDOSCOPY;  Service: Cardiovascular;  Laterality: N/A;  . COLONOSCOPY    . HERNIA REPAIR    . KNEE SURGERY    . MULTIPLE TOOTH EXTRACTIONS    . PROSTATE SURGERY    . RADIOACTIVE SEED IMPLANT    . SHOULDER SURGERY    . TOTAL KNEE ARTHROPLASTY Right 11/19/2016   Procedure: RIGHT TOTAL KNEE ARTHROPLASTY;  Surgeon: Leandrew Koyanagi, MD;  Location: [place];  Service: Orthopedics;  Laterality: Right;  . uretha surgery-2014      Current Medications: Prior to Admission medications   Medication Sig Start Date End Date Taking? Authorizing Provider  AMBULATORY NON FORMULARY MEDICATION Full Face CPAP Mask with Tubing Dx: G47.30 11/12/16   Reed, Tiffany L, DO  amLODipine (NORVASC) 10 MG tablet Take 1 tablet (10 mg total) by mouth daily. For high blood pressure 05/06/17   [name], NP  aspirin  EC 81 MG tablet Take 81 mg by mouth daily.    [provider]  atorvastatin (LIPITOR) 20 MG tablet Take one tablet by mouth once daily for cholesterol 05/06/17   [name], NP  carvedilol (COREG) 12.5 MG tablet Take 1 tablet (12.5 mg total) by mouth 2 (two) times daily with a meal. 06/03/17   Gherghe, Vella Redhead, MD  furosemide (LASIX) 20 MG tablet Take 1 tablet (20 mg total) by mouth daily. 06/04/17    Caren Griffins, MD  glipiZIDE (GLUCOTROL) 5 MG tablet Take 1 tablet (5 mg total) by mouth daily before breakfast. 05/06/17   [name], NP  hydrALAZINE (APRESOLINE) 25 MG tablet Take 1 tablet (25 mg total) by mouth 3 (three) times daily. 05/06/17   [name], NP  losartan (COZAAR) 100 MG tablet Take 1 tablet (100 mg total) by mouth daily. 12/14/16   [name], NP  metFORMIN (GLUCOPHAGE) 500 MG tablet TAKE 1 TABLET BY MOUTH 2 TIMES A DAY WITH A MEAL for diabetes 01/21/16   Estill Dooms, MD  omeprazole (PRILOSEC) 20 MG capsule Take 1 capsule (20 mg total) by mouth as needed. 12/14/16   [name], NP  potassium chloride SA (K-DUR,KLOR-CON) 20 MEQ tablet Take 1 tablet (20 mEq total) by mouth 2 (two) times daily. 12/14/16   [name], NP  rivaroxaban (XARELTO) 20 MG TABS tablet Take 1 tablet (20 mg total) by mouth daily with supper. 01/23/16   Volanda Napoleon, MD  senna-docusate (SENOKOT S) 8.6-50 MG tablet Take 1 tablet by mouth at bedtime as needed. 11/19/16   Leandrew Koyanagi, MD  traMADol (ULTRAM) 50 MG tablet Take 1 tablet (50 mg total) by mouth every 6 (six) hours as needed. for pain 05/06/17   [name], NP  TRUE METRIX BLOOD GLUCOSE TEST test strip CHECK BLOOD SUGAR TWICE PER DAY 10/24/15   [name], NP  TRUEPLUS LANCETS 30G MISC USE TO TEST BLOOD SUGAR 2 TIMES A DAY 09/12/14   [name], NP   Allergies:   Lisinopril   Social History   Socioeconomic History  . Marital status: Married    Spouse name: Not on file  . Number of children: Not on file  . Years of education: Not on file  . Highest education level: Not on file  Occupational History  . Not on file  Social Needs  . Financial resource strain: Not on file  . Food insecurity:    Worry: Not on file    Inability: Not on file  . Transportation needs:    Medical: Not on file    Non-medical: Not on file  Tobacco Use  . Smoking status: Never Smoker  . Smokeless  tobacco: Never Used  Substance and Sexual Activity  . Alcohol use: No    Comment: never  . Drug use: No  . Sexual activity: Yes  Lifestyle  . Physical activity:    Days per week: Not on file    Minutes per session: Not on file  . Stress: Not on file  Relationships  . Social connections:    Talks on phone: Not on file    Gets together: Not on file    Attends religious service: Not on file    Active member of club or organization: Not on file    Attends meetings of clubs or organizations: Not on file    Relationship status: Not on file  Other Topics  Concern  . Not on file  Social History Narrative  . Not on file     Family History:  The patient's family history includes Breast cancer (age of onset: 45) in his sister; Breast cancer (age of onset: 1) in his maternal aunt; Breast cancer (age of onset: 63) in his mother; Cancer (age of onset: 50) in his sister; Cancer (age of onset: 3) in his maternal uncle; Cervical cancer (age of onset: 50) in his other; Leukemia (age of onset: 62) in his brother; Lung cancer in his maternal uncle; Other (age of onset: 18) in his brother; Prostate cancer in his brother and paternal uncle.   ROS:   Please see the history of present illness.    ROS All other systems reviewed and are negative.   PHYSICAL EXAM:   VS:  BP 132/68   Pulse 60   Ht _0  (1.753 m)   Wt (!) 320 lb 1.9 oz (145.2 kg)   BMI 47.27 kg/m    GEN: Well nourished, well developed, in no acute distress  HEENT: normal  Neck: no JVD, carotid bruits, or masses Cardiac:RRR; no murmurs, rubs, or gallops, Trace BL LE edema  Respiratory:  clear to auscultation bilaterally, normal work of breathing GI: soft, nontender, nondistended, + BS MS: no deformity or atrophy  Skin: warm and dry, no rash Neuro:  Alert and Oriented x 3, Strength and sensation are intact Psych: euthymic mood, full affect  Wt Readings from Last 3 Encounters:  06/15/17 (!) 320 lb 1.9 oz (145.2 kg)  06/03/17  (!) 321 lb 6.9 oz (145.8 kg)  05/13/17 (!) 311 lb 1.9 oz (141.1 kg)      Studies/Labs Reviewed:   EKG:  EKG is ordered today.  The ekg ordered today demonstrates NSR  Recent Labs: 05/31/2017: ALT 13; B Natriuretic Peptide 450.6 06/01/2017: TSH 1.471 06/03/2017: BUN 18; Creatinine, Ser 1.37; Hemoglobin 11.4; Magnesium 2.1; Platelets 286; Potassium 3.6; Sodium 141   Lipid Panel    Component Value Date/Time   CHOL 127 06/01/2017 0236   CHOL 153 03/08/2015 0834   TRIG 96 06/01/2017 0236   HDL 44 06/01/2017 0236   HDL 52 03/08/2015 0834   CHOLHDL 2.9 06/01/2017 0236   VLDL 19 06/01/2017 0236   LDLCALC 64 06/01/2017 0236   LDLCALC 80 05/04/2017 0812    Additional studies/ records that were reviewed today include:   Echocardiogram: 06/02/17 Study Conclusions  - Left ventricle: The cavity size was normal. Systolic function was   mildly reduced. The estimated ejection fraction was in the range   of 45% to 50%. Diffuse hypokinesis. The study is not technically   sufficient to allow evaluation of LV diastolic function. - Aortic valve: Trileaflet; mildly thickened, mildly calcified   leaflets. There was trivial regurgitation. - Left atrium: The atrium was mildly dilated. - Pulmonary arteries: Systolic pressure was mildly increased. PA   peak pressure: 35 mm Hg (S).   ASSESSMENT & PLAN:    1. Paroxysmal atrial fibrillation -Maintaining sinus rhythm.  Continue Coreg at 12.5 mg twice daily.  Xarelto for anticoagulation.  2. Chest pain with moderate risk for cardiac etiology - Felt atypical during admission.  However his symptoms seems mixed.  Recently noted dyspnea on exertion, limiting his activity.  He also has substernal chest pressure with and without exertion.  His symptoms is difficult to differentiate.  Not typical of acid reflux.  I have reviewed with Dr. Irish Lack.  Given symptoms concerning for angina and newly reduced  LVEF will get left and right cardiac catheterization  after 4 weeks of anticoagulation post recent cardioversion.  3. HTN - Continue current therapy.   3. Chronic combined CHF - Echo 06/02/17 showed LVEF of 45-50% with diffuse hypokinesis. This study is not technically sufficient to allow evaluation of LV diastolic function. (LVEF was 55-60% on 10/2017with grade 2 DD). Likely tachy mediated given recent afib RVR. However can not r/o ischemic etiology.  R & L cath as above. Continue coreg, losartan and lasix at current dose.   Reviewed with Pharmacy and Dr. Irish Lack, hold Xaralto 24 hours prior to cath. The patient understands that risks include but are not limited to stroke (1 in 1000), death (1 in 35), kidney failure [usually temporary] (1 in 500), bleeding (1 in 200), allergic reaction [possibly serious] (1 in 200), and agrees to proceed.    Medication Adjustments/Labs and Tests Ordered: Current medicines are reviewed at length with the patient today.  Concerns regarding medicines are outlined above.  Medication changes, Labs and Tests ordered today are listed in the Patient Instructions below. Patient Instructions  Medication Instructions: Your physician recommends that you continue on your current medications as directed. Please refer to the Current Medication list given to you today.   Labwork: One week before procedure   Procedures/Testing: Your physician has requested that you have a cardiac catheterization. Cardiac catheterization is used to diagnose and/or treat various heart conditions. Doctors may recommend this procedure for a number of different reasons. The most common reason is to evaluate chest pain. Chest pain can be a symptom of coronary artery disease (CAD), and cardiac catheterization can show whether plaque is narrowing or blocking your heart's arteries. This procedure is also used to evaluate the valves, as well as measure the blood flow and oxygen levels in different parts of your heart. For further information please  visit [email or web address]. Please follow instruction sheet, as given.  I WILL CALL YOU WITH INSTRUCTIONS   Follow-Up:Your physician recommends that you schedule a follow-up appointment in: 2 weeks after your procedure    Any Additional Special Instructions Will Be Listed Below (If Applicable).   Coronary Angiogram With Stent Coronary angiogram with stent placement is a procedure to widen or open a narrow blood vessel of the heart (coronary artery). Arteries may become blocked by cholesterol buildup (plaques) in the lining of the wall. When a coronary artery becomes partially blocked, blood flow to that area decreases. This may lead to chest pain or a heart attack (myocardial infarction). A stent is a small piece of metal that looks like mesh or a spring. Stent placement may be done as treatment for a heart attack or right after a coronary angiogram in which a blocked artery is found. Let your health care provider know about:  Any allergies you have.  All medicines you are taking, including vitamins, herbs, eye drops, creams, and over-the-counter medicines.  Any problems you or family members have had with anesthetic medicines.  Any blood disorders you have.  Any surgeries you have had.  Any medical conditions you have.  Whether you are pregnant or may be pregnant. What are the risks? Generally, this is a safe procedure. However, problems may occur, including:  Damage to the heart or its blood vessels.  A return of blockage.  Bleeding, infection, or bruising at the insertion site.  A collection of blood under the skin (hematoma) at the insertion site.  A blood clot in another part of the body.  Kidney injury.  Allergic reaction to the dye or contrast that is used.  Bleeding into the abdomen (retroperitoneal bleeding).  What happens before the procedure? Staying hydrated Follow instructions from your health care provider about hydration, which may include:  Up to 2  hours before the procedure - you may continue to drink clear liquids, such as water, clear fruit juice, black coffee, and plain tea.  Eating and drinking restrictions Follow instructions from your health care provider about eating and drinking, which may include:  8 hours before the procedure - stop eating heavy meals or foods such as meat, fried foods, or fatty foods.  6 hours before the procedure - stop eating light meals or foods, such as toast or cereal.  2 hours before the procedure - stop drinking clear liquids.  Ask your health care provider about:  Changing or stopping your regular medicines. This is especially important if you are taking diabetes medicines or blood thinners.  Taking medicines such as ibuprofen. These medicines can thin your blood. Do not take these medicines before your procedure if your health care provider instructs you not to. Generally, aspirin is recommended before a procedure of passing a small, thin tube (catheter) through a blood vessel and into the heart (cardiac catheterization).  What happens during the procedure?  An IV tube will be inserted into one of your veins.  You will be given one or more of the following: ? A medicine to help you relax (sedative). ? A medicine to numb the area where the catheter will be inserted into an artery (local anesthetic).  To reduce your risk of infection: ? Your health care team will wash or sanitize their hands. ? Your skin will be washed with soap. ? Hair may be removed from the area where the catheter will be inserted.  Using a guide wire, the catheter will be inserted into an artery. The location may be in your groin, in your wrist, or in the fold of your arm (near your elbow).  A type of X-ray (fluoroscopy) will be used to help guide the catheter to the opening of the arteries in the heart.  A dye will be injected into the catheter, and X-rays will be taken. The dye will help to show where any narrowing or  blockages are located in the arteries.  A tiny wire will be guided to the blocked spot, and a balloon will be inflated to make the artery wider.  The stent will be expanded and will crush the plaques into the wall of the vessel. The stent will hold the area open and improve the blood flow. Most stents have a drug coating to reduce the risk of the stent narrowing over time.  The artery may be made wider using a drill, laser, or other tools to remove plaques.  When the blood flow is better, the catheter will be removed. The lining of the artery will grow over the stent, which stays where it was placed. This procedure may vary among health care providers and hospitals. What happens after the procedure?  If the procedure is done through the leg, you will be kept in bed lying flat for about 6 hours. You will be instructed to not bend and not cross your legs.  The insertion site will be checked frequently.  The pulse in your foot or wrist will be checked frequently.  You may have additional blood tests, X-rays, and a test that records the electrical activity of your heart (electrocardiogram, or ECG). This information  is not intended to replace advice given to you by your health care provider. Make sure you discuss any questions you have with your health care provider. Document Released: 08/09/2002 Document Revised: 10/03/2015 Document Reviewed: 09/08/2015 Elsevier Interactive Patient Education  [employer].    If you need a refill on your cardiac medications before your next appointment, please call your pharmacy.      Jarrett Soho, [place]  06/15/2017 12:23 PM    [facility] [address], [place], [place]  [address] Phone: [phone]; Fax: [phone]

## 2017-06-15 NOTE — Telephone Encounter (Signed)
Patient called requesting order for CPAP Hose. Patient would like order faxed [facility]. Order faxed to [phone].

## 2017-06-15 NOTE — Patient Instructions (Signed)
Medication Instructions: Your physician recommends that you continue on your current medications as directed. Please refer to the Current Medication list given to you today.   Labwork: One week before procedure   Procedures/Testing: Your physician has requested that you have a cardiac catheterization. Cardiac catheterization is used to diagnose and/or treat various heart conditions. Doctors may recommend this procedure for a number of different reasons. The most common reason is to evaluate chest pain. Chest pain can be a symptom of coronary artery disease (CAD), and cardiac catheterization can show whether plaque is narrowing or blocking your heart's arteries. This procedure is also used to evaluate the valves, as well as measure the blood flow and oxygen levels in different parts of your heart. For further information please visit [email or web address]. Please follow instruction sheet, as given.  I WILL CALL YOU WITH INSTRUCTIONS   Follow-Up:Your physician recommends that you schedule a follow-up appointment in: 2 weeks after your procedure    Any Additional Special Instructions Will Be Listed Below (If Applicable).   Coronary Angiogram With Stent Coronary angiogram with stent placement is a procedure to widen or open a narrow blood vessel of the heart (coronary artery). Arteries may become blocked by cholesterol buildup (plaques) in the lining of the wall. When a coronary artery becomes partially blocked, blood flow to that area decreases. This may lead to chest pain or a heart attack (myocardial infarction). A stent is a small piece of metal that looks like mesh or a spring. Stent placement may be done as treatment for a heart attack or right after a coronary angiogram in which a blocked artery is found. Let your health care provider know about:  Any allergies you have.  All medicines you are taking, including vitamins, herbs, eye drops, creams, and over-the-counter medicines.  Any  problems you or family members have had with anesthetic medicines.  Any blood disorders you have.  Any surgeries you have had.  Any medical conditions you have.  Whether you are pregnant or may be pregnant. What are the risks? Generally, this is a safe procedure. However, problems may occur, including:  Damage to the heart or its blood vessels.  A return of blockage.  Bleeding, infection, or bruising at the insertion site.  A collection of blood under the skin (hematoma) at the insertion site.  A blood clot in another part of the body.  Kidney injury.  Allergic reaction to the dye or contrast that is used.  Bleeding into the abdomen (retroperitoneal bleeding).  What happens before the procedure? Staying hydrated Follow instructions from your health care provider about hydration, which may include:  Up to 2 hours before the procedure - you may continue to drink clear liquids, such as water, clear fruit juice, black coffee, and plain tea.  Eating and drinking restrictions Follow instructions from your health care provider about eating and drinking, which may include:  8 hours before the procedure - stop eating heavy meals or foods such as meat, fried foods, or fatty foods.  6 hours before the procedure - stop eating light meals or foods, such as toast or cereal.  2 hours before the procedure - stop drinking clear liquids.  Ask your health care provider about:  Changing or stopping your regular medicines. This is especially important if you are taking diabetes medicines or blood thinners.  Taking medicines such as ibuprofen. These medicines can thin your blood. Do not take these medicines before your procedure if your health care provider instructs you not  to. Generally, aspirin is recommended before a procedure of passing a small, thin tube (catheter) through a blood vessel and into the heart (cardiac catheterization).  What happens during the procedure?  An IV tube  will be inserted into one of your veins.  You will be given one or more of the following: ? A medicine to help you relax (sedative). ? A medicine to numb the area where the catheter will be inserted into an artery (local anesthetic).  To reduce your risk of infection: ? Your health care team will wash or sanitize their hands. ? Your skin will be washed with soap. ? Hair may be removed from the area where the catheter will be inserted.  Using a guide wire, the catheter will be inserted into an artery. The location may be in your groin, in your wrist, or in the fold of your arm (near your elbow).  A type of X-ray (fluoroscopy) will be used to help guide the catheter to the opening of the arteries in the heart.  A dye will be injected into the catheter, and X-rays will be taken. The dye will help to show where any narrowing or blockages are located in the arteries.  A tiny wire will be guided to the blocked spot, and a balloon will be inflated to make the artery wider.  The stent will be expanded and will crush the plaques into the wall of the vessel. The stent will hold the area open and improve the blood flow. Most stents have a drug coating to reduce the risk of the stent narrowing over time.  The artery may be made wider using a drill, laser, or other tools to remove plaques.  When the blood flow is better, the catheter will be removed. The lining of the artery will grow over the stent, which stays where it was placed. This procedure may vary among health care providers and hospitals. What happens after the procedure?  If the procedure is done through the leg, you will be kept in bed lying flat for about 6 hours. You will be instructed to not bend and not cross your legs.  The insertion site will be checked frequently.  The pulse in your foot or wrist will be checked frequently.  You may have additional blood tests, X-rays, and a test that records the electrical activity of your  heart (electrocardiogram, or ECG). This information is not intended to replace advice given to you by your health care provider. Make sure you discuss any questions you have with your health care provider. Document Released: 08/09/2002 Document Revised: 10/03/2015 Document Reviewed: 09/08/2015 Elsevier Interactive Patient Education  [employer].    If you need a refill on your cardiac medications before your next appointment, please call your pharmacy.

## 2017-06-29 ENCOUNTER — Other Ambulatory Visit: Payer: Medicare HMO | Admitting: *Deleted

## 2017-06-29 DIAGNOSIS — I200 Unstable angina: Secondary | ICD-10-CM | POA: Diagnosis not present

## 2017-06-29 DIAGNOSIS — I5022 Chronic systolic (congestive) heart failure: Secondary | ICD-10-CM | POA: Diagnosis not present

## 2017-06-29 LAB — CBC
Hematocrit: 36.2 % — ABNORMAL LOW (ref 37.5–51.0)
Hemoglobin: 12 g/dL — ABNORMAL LOW (ref 13.0–17.7)
MCH: 27.3 pg (ref 26.6–33.0)
MCHC: 33.1 g/dL (ref 31.5–35.7)
MCV: 83 fL (ref 79–97)
Platelets: 307 10*3/uL (ref 150–379)
RBC: 4.39 x10E6/uL (ref 4.14–5.80)
RDW: 14.3 % (ref 12.3–15.4)
WBC: 7.2 10*3/uL (ref 3.4–10.8)

## 2017-06-29 LAB — BASIC METABOLIC PANEL
BUN/Creatinine Ratio: 16 (ref 10–24)
BUN: 16 mg/dL (ref 8–27)
CO2: 23 mmol/L (ref 20–29)
Calcium: 9 mg/dL (ref 8.6–10.2)
Chloride: 102 mmol/L (ref 96–106)
Creatinine, Ser: 1.01 mg/dL (ref 0.76–1.27)
GFR calc Af Amer: 89 mL/min/{1.73_m2} (ref >59)
GFR calc non Af Amer: 77 mL/min/{1.73_m2} (ref >59)
Glucose: 182 mg/dL — ABNORMAL HIGH (ref 65–99)
Potassium: 3.9 mmol/L (ref 3.5–5.2)
Sodium: 141 mmol/L (ref 134–144)

## 2017-06-30 ENCOUNTER — Other Ambulatory Visit: Payer: Self-pay | Admitting: Nurse Practitioner

## 2017-06-30 DIAGNOSIS — I10 Essential (primary) hypertension: Secondary | ICD-10-CM

## 2017-06-30 MED FILL — FUROSEMIDE 20 MG TABS: 20 | 30 days supply | Qty: 30 | Fill #1

## 2017-06-30 MED FILL — LOSARTAN POTASSIUM 100 MG T: 100 | 90 days supply | Qty: 90 | Fill #0

## 2017-07-05 ENCOUNTER — Telehealth (INDEPENDENT_AMBULATORY_CARE_PROVIDER_SITE_OTHER): Payer: Self-pay | Admitting: Orthopaedic Surgery

## 2017-07-05 ENCOUNTER — Other Ambulatory Visit (INDEPENDENT_AMBULATORY_CARE_PROVIDER_SITE_OTHER): Payer: Self-pay

## 2017-07-05 ENCOUNTER — Telehealth: Payer: Self-pay | Admitting: Physician Assistant

## 2017-07-05 ENCOUNTER — Telehealth: Payer: Self-pay | Admitting: *Deleted

## 2017-07-05 MED ORDER — AMOXICILLIN 500 MG PO TABS: ORAL_TABLET | ORAL | 0 refills | Status: DC

## 2017-07-05 MED FILL — AMOXICILLIN 500 MG CAPSULE: 500 | 1 days supply | Qty: 4 | Fill #0

## 2017-07-05 NOTE — Telephone Encounter (Signed)
Pt contacted pre-catheterization scheduled at [facility] for: Tuesday May 21,2019 7:30 AM Verified arrival time and place: [place] Entrance A at: 5:30 AM  No solid food after midnight prior to cath, clear liquids until 5 AM day of procedure. Verified allergies in Epic  Hold: Xarelto last dose 07/04/17 until post procedure. Glipizide AM of procedure Metformin AM of procedure and 48 hours post procedure. Furosemide AM of procedure. KCl AM of procedure.  AM meds can be  taken pre-cath with sip of water including: ASA 81 mg  Confirmed patient has responsible person to drive home post procedure and observe patient for 24 hours: yes

## 2017-07-05 NOTE — Telephone Encounter (Signed)
Returned call to patient who states that Dr. [name] wants him to take amoxicillin 2 g 30 minutes prior to his heart cath tomorrow. Patient states that he just wanted to call and make [place] aware. Made patient aware that it is not required from a cardiac standpoint, but if Dr. [name] requires it to be done since he had recent knee surgery that that was fine and there was no contraindication. Patient verbalized understanding and thanked me for the call.

## 2017-07-05 NOTE — Telephone Encounter (Signed)
Rx sent in to pharm 

## 2017-07-05 NOTE — Telephone Encounter (Signed)
Yes 2 g amoxicillin 30 mins before procedure

## 2017-07-05 NOTE — Telephone Encounter (Signed)
See message.

## 2017-07-05 NOTE — Telephone Encounter (Signed)
New message:       Pt is calling and states his ortho dr has called him in some amoxicillin and he states he needed to let us know because he spoke with someone this morning pertaining to his medications he his currenlty

## 2017-07-05 NOTE — Telephone Encounter (Signed)
Patient called stating he is getting a heart cath tomorrow and he was wondering if he was going to need antibiotics since he had surgery with Dr. [name] the end of last year. CB # [phone]

## 2017-07-06 ENCOUNTER — Ambulatory Visit (HOSPITAL_COMMUNITY)
Admission: RE | Admit: 2017-07-06 | Discharge: 2017-07-06 | Disposition: A | Discharge status: Home / Self Care | Payer: Medicare HMO | Source: Ambulatory Visit | Attending: Interventional Cardiology | Admitting: Interventional Cardiology

## 2017-07-06 ENCOUNTER — Encounter (HOSPITAL_COMMUNITY)
Admission: RE | Disposition: A | Discharge status: Home / Self Care | Payer: Self-pay | Source: Ambulatory Visit | Attending: Interventional Cardiology

## 2017-07-06 ENCOUNTER — Encounter (HOSPITAL_COMMUNITY): Payer: Self-pay | Admitting: Interventional Cardiology

## 2017-07-06 DIAGNOSIS — Z7901 Long term (current) use of anticoagulants: Secondary | ICD-10-CM | POA: Insufficient documentation

## 2017-07-06 DIAGNOSIS — R0609 Other forms of dyspnea: Secondary | ICD-10-CM | POA: Insufficient documentation

## 2017-07-06 DIAGNOSIS — M1990 Unspecified osteoarthritis, unspecified site: Secondary | ICD-10-CM | POA: Insufficient documentation

## 2017-07-06 DIAGNOSIS — E1122 Type 2 diabetes mellitus with diabetic chronic kidney disease: Secondary | ICD-10-CM | POA: Insufficient documentation

## 2017-07-06 DIAGNOSIS — Z6841 Body Mass Index (BMI) 40.0 and over, adult: Secondary | ICD-10-CM | POA: Diagnosis not present

## 2017-07-06 DIAGNOSIS — I272 Pulmonary hypertension, unspecified: Secondary | ICD-10-CM | POA: Diagnosis not present

## 2017-07-06 DIAGNOSIS — K219 Gastro-esophageal reflux disease without esophagitis: Secondary | ICD-10-CM | POA: Diagnosis not present

## 2017-07-06 DIAGNOSIS — Z888 Allergy status to other drugs, medicaments and biological substances status: Secondary | ICD-10-CM | POA: Insufficient documentation

## 2017-07-06 DIAGNOSIS — I25119 Atherosclerotic heart disease of native coronary artery with unspecified angina pectoris: Secondary | ICD-10-CM | POA: Diagnosis not present

## 2017-07-06 DIAGNOSIS — I130 Hypertensive heart and chronic kidney disease with heart failure and stage 1 through stage 4 chronic kidney disease, or unspecified chronic kidney disease: Secondary | ICD-10-CM | POA: Insufficient documentation

## 2017-07-06 DIAGNOSIS — Z8546 Personal history of malignant neoplasm of prostate: Secondary | ICD-10-CM | POA: Insufficient documentation

## 2017-07-06 DIAGNOSIS — Z86711 Personal history of pulmonary embolism: Secondary | ICD-10-CM | POA: Insufficient documentation

## 2017-07-06 DIAGNOSIS — Z8049 Family history of malignant neoplasm of other genital organs: Secondary | ICD-10-CM | POA: Insufficient documentation

## 2017-07-06 DIAGNOSIS — Z803 Family history of malignant neoplasm of breast: Secondary | ICD-10-CM | POA: Insufficient documentation

## 2017-07-06 DIAGNOSIS — I5042 Chronic combined systolic (congestive) and diastolic (congestive) heart failure: Secondary | ICD-10-CM | POA: Diagnosis not present

## 2017-07-06 DIAGNOSIS — Z8042 Family history of malignant neoplasm of prostate: Secondary | ICD-10-CM | POA: Insufficient documentation

## 2017-07-06 DIAGNOSIS — E785 Hyperlipidemia, unspecified: Secondary | ICD-10-CM | POA: Diagnosis not present

## 2017-07-06 DIAGNOSIS — Z7984 Long term (current) use of oral hypoglycemic drugs: Secondary | ICD-10-CM | POA: Insufficient documentation

## 2017-07-06 DIAGNOSIS — E1140 Type 2 diabetes mellitus with diabetic neuropathy, unspecified: Secondary | ICD-10-CM | POA: Insufficient documentation

## 2017-07-06 DIAGNOSIS — Z96651 Presence of right artificial knee joint: Secondary | ICD-10-CM | POA: Insufficient documentation

## 2017-07-06 DIAGNOSIS — I824Y2 Acute embolism and thrombosis of unspecified deep veins of left proximal lower extremity: Secondary | ICD-10-CM

## 2017-07-06 DIAGNOSIS — I5022 Chronic systolic (congestive) heart failure: Secondary | ICD-10-CM

## 2017-07-06 DIAGNOSIS — C61 Malignant neoplasm of prostate: Secondary | ICD-10-CM

## 2017-07-06 DIAGNOSIS — G4733 Obstructive sleep apnea (adult) (pediatric): Secondary | ICD-10-CM | POA: Insufficient documentation

## 2017-07-06 DIAGNOSIS — I2782 Chronic pulmonary embolism: Secondary | ICD-10-CM

## 2017-07-06 DIAGNOSIS — Z7982 Long term (current) use of aspirin: Secondary | ICD-10-CM | POA: Diagnosis not present

## 2017-07-06 DIAGNOSIS — N183 Chronic kidney disease, stage 3 (moderate): Secondary | ICD-10-CM | POA: Diagnosis not present

## 2017-07-06 DIAGNOSIS — Z809 Family history of malignant neoplasm, unspecified: Secondary | ICD-10-CM | POA: Insufficient documentation

## 2017-07-06 DIAGNOSIS — Z9889 Other specified postprocedural states: Secondary | ICD-10-CM | POA: Insufficient documentation

## 2017-07-06 DIAGNOSIS — I480 Paroxysmal atrial fibrillation: Secondary | ICD-10-CM | POA: Diagnosis not present

## 2017-07-06 DIAGNOSIS — Z806 Family history of leukemia: Secondary | ICD-10-CM | POA: Insufficient documentation

## 2017-07-06 DIAGNOSIS — Z801 Family history of malignant neoplasm of trachea, bronchus and lung: Secondary | ICD-10-CM | POA: Insufficient documentation

## 2017-07-06 DIAGNOSIS — I2602 Saddle embolus of pulmonary artery with acute cor pulmonale: Secondary | ICD-10-CM

## 2017-07-06 DIAGNOSIS — Z86718 Personal history of other venous thrombosis and embolism: Secondary | ICD-10-CM | POA: Insufficient documentation

## 2017-07-06 DIAGNOSIS — I2699 Other pulmonary embolism without acute cor pulmonale: Secondary | ICD-10-CM

## 2017-07-06 DIAGNOSIS — Z79899 Other long term (current) drug therapy: Secondary | ICD-10-CM | POA: Insufficient documentation

## 2017-07-06 HISTORY — PX: RIGHT/LEFT HEART CATH AND CORONARY ANGIOGRAPHY: CATH118266

## 2017-07-06 LAB — POCT I-STAT 3, ART BLOOD GAS (G3+)
Acid-base deficit: 1 mmol/L (ref 0.0–2.0)
Bicarbonate: 25 mmol/L (ref 20.0–28.0)
O2 Saturation: 91 %
TCO2: 26 mmol/L (ref 22–32)
pCO2 arterial: 43.5 mmHg (ref 32.0–48.0)
pH, Arterial: 7.367 (ref 7.350–7.450)
pO2, Arterial: 63 mmHg — ABNORMAL LOW (ref 83.0–108.0)

## 2017-07-06 LAB — POCT I-STAT 3, VENOUS BLOOD GAS (G3P V)
Acid-Base Excess: 1 mmol/L (ref 0.0–2.0)
Bicarbonate: 26.6 mmol/L (ref 20.0–28.0)
Bicarbonate: 27.5 mmol/L (ref 20.0–28.0)
O2 Saturation: 71 %
O2 Saturation: 72 %
TCO2: 28 mmol/L (ref 22–32)
TCO2: 29 mmol/L (ref 22–32)
pCO2, Ven: 49.3 mmHg (ref 44.0–60.0)
pCO2, Ven: 50.6 mmHg (ref 44.0–60.0)
pH, Ven: 7.34 (ref 7.250–7.430)
pH, Ven: 7.343 (ref 7.250–7.430)
pO2, Ven: 40 mmHg (ref 32.0–45.0)
pO2, Ven: 41 mmHg (ref 32.0–45.0)

## 2017-07-06 LAB — GLUCOSE, CAPILLARY: Glucose-Capillary: 200 mg/dL — ABNORMAL HIGH (ref 65–99)

## 2017-07-06 SURGERY — RIGHT/LEFT HEART CATH AND CORONARY ANGIOGRAPHY
Anesthesia: LOCAL

## 2017-07-06 MED ORDER — SODIUM CHLORIDE 0.9% FLUSH: 3.0000 mL | INTRAVENOUS | Status: DC | PRN

## 2017-07-06 MED ORDER — FENTANYL CITRATE (PF) 100 MCG/2ML IJ SOLN
INTRAMUSCULAR | Status: AC
  Filled 2017-07-06: qty 2

## 2017-07-06 MED ORDER — MIDAZOLAM HCL 2 MG/2ML IJ SOLN
INTRAMUSCULAR | Status: DC | PRN
  Administered 2017-07-06: 1 mg via INTRAVENOUS
  Administered 2017-07-06: 2 mg via INTRAVENOUS

## 2017-07-06 MED ORDER — FENTANYL CITRATE (PF) 100 MCG/2ML IJ SOLN
INTRAMUSCULAR | Status: DC | PRN
  Administered 2017-07-06 (×2): 25 ug via INTRAVENOUS

## 2017-07-06 MED ORDER — VERAPAMIL HCL 2.5 MG/ML IV SOLN
INTRAVENOUS | Status: AC
  Filled 2017-07-06: qty 2

## 2017-07-06 MED ORDER — ONDANSETRON HCL 4 MG/2ML IJ SOLN
4.0000 mg | Freq: Four times a day (QID) | INTRAMUSCULAR | Status: DC | PRN
Start: 2017-07-06 — End: 2017-07-06

## 2017-07-06 MED ORDER — SODIUM CHLORIDE 0.9 % IV SOLN: 250.0000 mL | INTRAVENOUS | Status: DC | PRN

## 2017-07-06 MED ORDER — LIDOCAINE HCL (PF) 1 % IJ SOLN
INTRAMUSCULAR | Status: DC | PRN
  Administered 2017-07-06: 2 mL via INTRADERMAL
  Administered 2017-07-06: 3 mL via INTRADERMAL

## 2017-07-06 MED ORDER — IOHEXOL 350 MG/ML SOLN
INTRAVENOUS | Status: DC | PRN
  Administered 2017-07-06: 65 mL via INTRA_ARTERIAL

## 2017-07-06 MED ORDER — METFORMIN HCL 500 MG PO TABS: ORAL_TABLET | ORAL | 3 refills | Status: DC

## 2017-07-06 MED ORDER — SODIUM CHLORIDE 0.9% FLUSH: 3.0000 mL | Freq: Two times a day (BID) | INTRAVENOUS | Status: DC

## 2017-07-06 MED ORDER — MIDAZOLAM HCL 2 MG/2ML IJ SOLN
INTRAMUSCULAR | Status: AC
  Filled 2017-07-06: qty 2

## 2017-07-06 MED ORDER — HEPARIN SODIUM (PORCINE) 1000 UNIT/ML IJ SOLN
INTRAMUSCULAR | Status: AC
  Filled 2017-07-06: qty 1

## 2017-07-06 MED ORDER — HEPARIN SODIUM (PORCINE) 1000 UNIT/ML IJ SOLN
INTRAMUSCULAR | Status: DC | PRN
  Administered 2017-07-06: 6000 [IU] via INTRAVENOUS

## 2017-07-06 MED ORDER — HEPARIN (PORCINE) IN NACL 1000-0.9 UT/500ML-% IV SOLN
INTRAVENOUS | Status: AC
  Filled 2017-07-06: qty 1000

## 2017-07-06 MED ORDER — SODIUM CHLORIDE 0.9 % IV SOLN
INTRAVENOUS | Status: DC
  Administered 2017-07-06: 07:00:00 via INTRAVENOUS

## 2017-07-06 MED ORDER — VERAPAMIL HCL 2.5 MG/ML IV SOLN
INTRAVENOUS | Status: DC | PRN
  Administered 2017-07-06: 10 mL via INTRA_ARTERIAL

## 2017-07-06 MED ORDER — LIDOCAINE HCL (PF) 1 % IJ SOLN
INTRAMUSCULAR | Status: AC
  Filled 2017-07-06: qty 30

## 2017-07-06 MED ORDER — ACETAMINOPHEN 325 MG PO TABS: 650.0000 mg | ORAL_TABLET | ORAL | Status: DC | PRN

## 2017-07-06 MED ORDER — RIVAROXABAN 20 MG PO TABS: 20.0000 mg | ORAL_TABLET | Freq: Every day | ORAL | 12 refills | Status: DC

## 2017-07-06 MED ORDER — HEPARIN (PORCINE) IN NACL 2-0.9 UNITS/ML
INTRAMUSCULAR | Status: DC | PRN
  Administered 2017-07-06 (×2): 500 mL via INTRA_ARTERIAL

## 2017-07-06 MED ORDER — ASPIRIN 81 MG PO CHEW: 81.0000 mg | CHEWABLE_TABLET | ORAL | Status: DC

## 2017-07-06 SURGICAL SUPPLY — 19 items
CATH BALLN WEDGE 5F 110CM (CATHETERS) ×1 IMPLANT
CATH INFINITI 5 FR JL3.5 (CATHETERS) ×1 IMPLANT
CATH INFINITI 5FR ANG PIGTAIL (CATHETERS) ×1 IMPLANT
CATH INFINITI JR4 5F (CATHETERS) ×1 IMPLANT
COVER PRB 48X5XTLSCP FOLD TPE (BAG) IMPLANT
COVER PROBE 5X48 (BAG) ×2
DEVICE RAD COMP TR BAND LRG (VASCULAR PRODUCTS) ×1 IMPLANT
GUIDEWIRE INQWIRE 1.5J.035X260 (WIRE) IMPLANT
HOVERMATT SINGLE USE (MISCELLANEOUS) ×1 IMPLANT
INQWIRE 1.5J .035X260CM (WIRE) ×2
KIT HEART LEFT (KITS) ×2 IMPLANT
NDL PERC 21GX4CM (NEEDLE) IMPLANT
NEEDLE PERC 21GX4CM (NEEDLE) ×2 IMPLANT
PACK CARDIAC CATHETERIZATION (CUSTOM PROCEDURE TRAY) ×2 IMPLANT
SHEATH RAIN 4/5FR (SHEATH) ×1 IMPLANT
SHEATH RAIN RADIAL 21G 6FR (SHEATH) ×1 IMPLANT
SYR MEDRAD MARK V 150ML (SYRINGE) ×1 IMPLANT
TRANSDUCER W/STOPCOCK (MISCELLANEOUS) ×2 IMPLANT
TUBING CIL FLEX 10 FLL-RA (TUBING) ×2 IMPLANT

## 2017-07-06 NOTE — Discharge Instructions (Signed)

## 2017-07-06 NOTE — Research (Signed)
[facility] Informed Consent   Subject Name: Thomas [name]  Subject met inclusion and exclusion criteria.  The informed consent form, study requirements and expectations were reviewed with the subject and questions and concerns were addressed prior to the signing of the consent form.  The subject verbalized understanding of the trail requirements.  The subject agreed to participate in the [facility] trial and signed the informed consent.  The informed consent was obtained prior to performance of any protocol-specific procedures for the subject.  A copy of the signed informed consent was given to the subject and a copy was placed in the subject's medical record.  Christena Flake 07/06/2017, 07:02 AM

## 2017-07-06 NOTE — Interval H&P Note (Signed)
Cath Lab Visit (complete for each Cath Lab visit)  Clinical Evaluation Leading to the Procedure:   ACS: No.  Non-ACS:    Anginal Classification: CCS III  Anti-ischemic medical therapy: Maximal Therapy (2 or more classes of medications)  Non-Invasive Test Results: Intermediate-risk stress test findings: cardiac mortality 1-3%/year  Prior CABG: No previous CABG Echo showed decreased LV function    History and Physical Interval Note:  07/06/2017 7:34 AM  Thomas [name]  has presented today for surgery, with the diagnosis of unstable angina - hf  The various methods of treatment have been discussed with the patient and family. After consideration of risks, benefits and other options for treatment, the patient has consented to  Procedure(s): RIGHT/LEFT HEART CATH AND CORONARY ANGIOGRAPHY (N/A) as a surgical intervention .  The patient's history has been reviewed, patient examined, no change in status, stable for surgery.  I have reviewed the patient's chart and labs.  Questions were answered to the patient's satisfaction.     Larae Grooms

## 2017-07-07 ENCOUNTER — Telehealth: Payer: Self-pay

## 2017-07-07 DIAGNOSIS — I272 Pulmonary hypertension, unspecified: Secondary | ICD-10-CM

## 2017-07-07 MED ORDER — FUROSEMIDE 20 MG PO TABS: 40.0000 mg | ORAL_TABLET | Freq: Every day | ORAL | 11 refills | Status: DC

## 2017-07-07 MED ORDER — POTASSIUM CHLORIDE CRYS ER 20 MEQ PO TBCR: 20.0000 meq | EXTENDED_RELEASE_TABLET | Freq: Two times a day (BID) | ORAL | 11 refills | Status: DC

## 2017-07-07 MED FILL — POTASSIUM CL ER 20 MEQ TABL: 20 | 30 days supply | Qty: 60 | Fill #0

## 2017-07-07 NOTE — Telephone Encounter (Signed)
-----   Message from Jettie Booze, MD sent at 07/06/2017 11:02 AM EDT ----- Marye Round, please increase Lasix to 40 mg BID for 3 days, then Lasix 40 mg daily .  He can take potassium 20 mEq daily. BMet in one week.

## 2017-07-07 NOTE — Telephone Encounter (Signed)
Called patient and instructed him to take lasix 40 mg BID x 3 days and then take lasix 40 mg QD after that. Patient currently takes potassium 20 mEq BID. Patient will continue to take this dose. BMET ordered and lab appointment made for 5/30.

## 2017-07-10 DIAGNOSIS — S838X9A Sprain of other specified parts of unspecified knee, initial encounter: Secondary | ICD-10-CM | POA: Diagnosis not present

## 2017-07-10 DIAGNOSIS — G4733 Obstructive sleep apnea (adult) (pediatric): Secondary | ICD-10-CM | POA: Diagnosis not present

## 2017-07-10 DIAGNOSIS — M1710 Unilateral primary osteoarthritis, unspecified knee: Secondary | ICD-10-CM | POA: Diagnosis not present

## 2017-07-10 DIAGNOSIS — G4730 Sleep apnea, unspecified: Secondary | ICD-10-CM | POA: Diagnosis not present

## 2017-07-13 ENCOUNTER — Telehealth: Payer: Self-pay

## 2017-07-13 NOTE — Telephone Encounter (Signed)
Patient's daughter called stating they have seen the cardiologist and needs his increased dose of carvedilol called in. He is currently taking 2 a day and normally gets 30 days prescriptions.   He also needs his Tramadol called in.   [facility].

## 2017-07-14 MED FILL — CARVEDILOL 12.5 MG TABLET: 12.5 | 30 days supply | Qty: 60 | Fill #0

## 2017-07-14 NOTE — Telephone Encounter (Signed)
Pt needs follow up since he has had recent hospitalization with medication changes.

## 2017-07-14 NOTE — Telephone Encounter (Signed)
Patient stated that he had gotten his medications straightened out. He did schedule an ED follow up for next week

## 2017-07-15 ENCOUNTER — Other Ambulatory Visit: Payer: Medicare HMO | Admitting: *Deleted

## 2017-07-15 DIAGNOSIS — I272 Pulmonary hypertension, unspecified: Secondary | ICD-10-CM | POA: Diagnosis not present

## 2017-07-15 LAB — BASIC METABOLIC PANEL
BUN/Creatinine Ratio: 14 (ref 10–24)
BUN: 13 mg/dL (ref 8–27)
CO2: 24 mmol/L (ref 20–29)
Calcium: 8.8 mg/dL (ref 8.6–10.2)
Chloride: 104 mmol/L (ref 96–106)
Creatinine, Ser: 0.95 mg/dL (ref 0.76–1.27)
GFR calc Af Amer: 95 mL/min/{1.73_m2} (ref >59)
GFR calc non Af Amer: 82 mL/min/{1.73_m2} (ref >59)
Glucose: 183 mg/dL — ABNORMAL HIGH (ref 65–99)
Potassium: 3.8 mmol/L (ref 3.5–5.2)
Sodium: 145 mmol/L — ABNORMAL HIGH (ref 134–144)

## 2017-07-20 ENCOUNTER — Encounter: Payer: Self-pay | Admitting: Nurse Practitioner

## 2017-07-20 ENCOUNTER — Telehealth: Payer: Self-pay

## 2017-07-20 ENCOUNTER — Ambulatory Visit (INDEPENDENT_AMBULATORY_CARE_PROVIDER_SITE_OTHER): Payer: Medicare HMO | Admitting: Nurse Practitioner

## 2017-07-20 VITALS — BP 144/78 | HR 63 | Temp 98.1°F | Ht 69.0 in | Wt 323.4 lb

## 2017-07-20 DIAGNOSIS — G4733 Obstructive sleep apnea (adult) (pediatric): Secondary | ICD-10-CM | POA: Diagnosis not present

## 2017-07-20 DIAGNOSIS — I10 Essential (primary) hypertension: Secondary | ICD-10-CM | POA: Diagnosis not present

## 2017-07-20 DIAGNOSIS — I509 Heart failure, unspecified: Secondary | ICD-10-CM | POA: Diagnosis not present

## 2017-07-20 DIAGNOSIS — E1122 Type 2 diabetes mellitus with diabetic chronic kidney disease: Secondary | ICD-10-CM | POA: Diagnosis not present

## 2017-07-20 DIAGNOSIS — I480 Paroxysmal atrial fibrillation: Secondary | ICD-10-CM

## 2017-07-20 DIAGNOSIS — Z9989 Dependence on other enabling machines and devices: Secondary | ICD-10-CM | POA: Diagnosis not present

## 2017-07-20 DIAGNOSIS — M170 Bilateral primary osteoarthritis of knee: Secondary | ICD-10-CM

## 2017-07-20 MED ORDER — FUROSEMIDE 40 MG PO TABS: 40.0000 mg | ORAL_TABLET | Freq: Every day | ORAL | 1 refills | Status: DC

## 2017-07-20 MED ORDER — CARVEDILOL 12.5 MG PO TABS: 12.5000 mg | ORAL_TABLET | Freq: Two times a day (BID) | ORAL | 1 refills | Status: DC

## 2017-07-20 MED ORDER — TRAMADOL HCL 50 MG PO TABS: 50.0000 mg | ORAL_TABLET | Freq: Four times a day (QID) | ORAL | 0 refills | Status: DC | PRN

## 2017-07-20 MED FILL — traMADol HCL 50 MG TABS: 50 | 8 days supply | Qty: 30 | Fill #0

## 2017-07-20 NOTE — Patient Instructions (Addendum)
Cancel appt for end of June  To make appt for labs after 08/31/17 and then follow up with Dr Eulas Post for routine follow up

## 2017-07-20 NOTE — Progress Notes (Addendum)
Careteam: Patient Care Team: Thomas Chandler, NP as PCP - General (Geriatric Medicine) Thomas Booze, MD as PCP - Cardiology (Cardiology) Thomas Riding, MD as Consulting Physician (Ophthalmology)  Advanced Directive information Does Patient Have a Medical Advance Directive?: No  Allergies  Allergen Reactions  . Lisinopril Cough    Chief Complaint  Patient presents with  . Follow-up    pt was seen at [facility] 07/06/17. Pt reports that he is still having SOB   . Medication Refill    Rx for lasix and tramadol pended. [place] database verified.      HPI: Patient is a 67 y.o. male seen in the office today to follow up due to recent hospitalization due to a fib with RVR and cardiac cath.  Pt with medical history significant ofhypertension, hyperlipidemia, diabetes mellitus, OSA, GERD, DVT and PE onXarelto, CKD-III, dCHF and prostate cancer, who presents with left-sided intermittent chest pain for 2 days with increased shortness of breath and noted to be in afib. He was cardioverted and then set up for cardiac cath His coreg was increased to 12.'5mg'$  by mouth twice daily for rate control. HR 63 today.  Taking 40 mg lasix PO daily. Reports he is has gained weight since hospitalization and having increased shortness of breath with any movement. Lays flat to sleep.  EF noted to be 45-50% Cardiac cath done 07/06/17 and did not need intervention  Has not set up follow up appt with Cardiologist.  Has not been able to exercise.   Requesting refill for tramadol due to pain in needs. Uses as needed, not necessarily daily.   Not taking jardiance anymore due to cost-  Metformin and glipizide have controlled blood sugars- fasting 120s, no hypoglycemia.   OSA- needs an updated CPAP, got new mask but machine is out of date. Using advance for supplies.   Review of Systems:  Review of Systems  Constitutional: Negative for chills, fever and malaise/fatigue.  HENT: Negative for  congestion, ear discharge, ear pain, hearing loss, sinus pain, sore throat and tinnitus.   Eyes: Negative for blurred vision.  Respiratory: Positive for shortness of breath. Negative for cough.   Cardiovascular: Negative for chest pain and palpitations.  Genitourinary: Positive for frequency.  Musculoskeletal: Positive for joint pain and myalgias.  Neurological: Positive for dizziness and tingling (due to diabetes). Negative for sensory change, weakness and headaches.  Endo/Heme/Allergies: Negative for environmental allergies.  Psychiatric/Behavioral: Negative for memory loss.    Past Medical History:  Diagnosis Date  . Acute on chronic diastolic CHF (congestive heart failure) ([place]) 06/01/2017  . Arthritis   . Cancer [facility]) 2010   Prostate  . Chronic kidney disease   . Diabetes mellitus   . Diabetic neuropathy (HCC)    feet  . DVT (deep venous thrombosis) ([place])   . Genetic testing 06/22/2016   [name] underwent genetic counseling and testing for hereditary cancer syndromes on 05/14/2016. His results were negative for mutations in all 46 genes analyzed by [facility]'s 46-gene Common Hereditary Cancers Panel. Genes analyzed include: APC, ATM, AXIN2, BARD1, BMPR1A, BRCA1, BRCA2, BRIP1, CDH1, CDKN2A, CHEK2, CTNNA1, DICER1, EPCAM, GREM1, HOXB13, KIT, MEN1, MLH1, MSH2, MSH3, MSH6, MUTYH, NB  . GERD (gastroesophageal reflux disease)   . Hypertension   . PE (pulmonary thromboembolism) ([employer])   . Pneumonia   . Sleep apnea    not wearing CPAP   Past Surgical History:  Procedure Laterality Date  . CARDIOVERSION N/A 06/03/2017   Procedure: CARDIOVERSION;  Surgeon:  Jerline Pain, MD;  Location: [facility];  Service: Cardiovascular;  Laterality: N/A;  . COLONOSCOPY    . HERNIA REPAIR    . KNEE SURGERY    . MULTIPLE TOOTH EXTRACTIONS    . PROSTATE SURGERY    . RADIOACTIVE SEED IMPLANT    . RIGHT/LEFT HEART CATH AND CORONARY ANGIOGRAPHY N/A 07/06/2017   Procedure: RIGHT/LEFT HEART CATH AND  CORONARY ANGIOGRAPHY;  Surgeon: Thomas Booze, MD;  Location: [facility];  Service: Cardiovascular;  Laterality: N/A;  . SHOULDER SURGERY    . TOTAL KNEE ARTHROPLASTY Right 11/19/2016   Procedure: RIGHT TOTAL KNEE ARTHROPLASTY;  Surgeon: Leandrew Koyanagi, MD;  Location: [facility];  Service: Orthopedics;  Laterality: Right;  . uretha surgery-2014     Social History:   reports that he has never smoked. He has never used smokeless tobacco. He reports that he does not drink alcohol or use drugs.  Family History  Problem Relation Age of Onset  . Breast cancer Mother 3       d.89  . Breast cancer Sister 37       d.30  . Leukemia Brother 18       d.20  . Breast cancer Maternal Aunt 40       d.40s  . Lung cancer Maternal Uncle   . Prostate cancer Paternal Uncle   . Prostate cancer Brother        recurred recently at age 93  . Other Brother 15       spinal tumor  . Cervical cancer Other 22       d.22  . Cancer Sister 48       unspecified type  . Cancer Maternal Uncle 80       unspecified type    Medications: Patient's Medications  New Prescriptions   No medications on file  Previous Medications   AMBULATORY NON FORMULARY MEDICATION    Full Face CPAP Mask with Tubing Dx: G47.30   AMLODIPINE (NORVASC) 10 MG TABLET    Take 1 tablet (10 mg total) by mouth daily. For high blood pressure   ASPIRIN EC 81 MG TABLET    Take 81 mg by mouth daily.   ATORVASTATIN (LIPITOR) 20 MG TABLET    Take one tablet by mouth once daily for cholesterol   CARVEDILOL (COREG) 12.5 MG TABLET    Take 1 tablet (12.5 mg total) by mouth 2 (two) times daily with a meal.   FUROSEMIDE (LASIX) 20 MG TABLET    Take 2 tablets (40 mg total) by mouth daily.   GLIPIZIDE (GLUCOTROL) 5 MG TABLET    Take 1 tablet (5 mg total) by mouth daily before breakfast.   HYDRALAZINE (APRESOLINE) 25 MG TABLET    Take 1 tablet (25 mg total) by mouth 3 (three) times daily.   LOSARTAN (COZAAR) 100 MG TABLET    TAKE 1 TABLET (100  MG TOTAL) BY MOUTH DAILY.   METFORMIN (GLUCOPHAGE) 500 MG TABLET    TAKE 1 TABLET BY MOUTH 2 TIMES A DAY WITH A MEAL for diabetes   OMEPRAZOLE (PRILOSEC) 20 MG CAPSULE    Take 1 capsule (20 mg total) by mouth as needed.   OVER THE COUNTER MEDICATION    Apply 1 application topically daily as needed (knee pain). Miracle Rub Gel   POTASSIUM CHLORIDE SA (K-DUR,KLOR-CON) 20 MEQ TABLET    Take 1 tablet (20 mEq total) by mouth 2 (two) times daily.   RIVAROXABAN (XARELTO) 20 MG  TABS TABLET    Take 1 tablet (20 mg total) by mouth daily with supper.   SENNA-DOCUSATE (SENOKOT S) 8.6-50 MG TABLET    Take 1 tablet by mouth at bedtime as needed.   TRAMADOL (ULTRAM) 50 MG TABLET    Take 1 tablet (50 mg total) by mouth every 6 (six) hours as needed. for pain   TRUE METRIX BLOOD GLUCOSE TEST TEST STRIP    CHECK BLOOD SUGAR TWICE PER DAY   TRUEPLUS LANCETS 30G MISC    USE TO TEST BLOOD SUGAR 2 TIMES A DAY   UNABLE TO FIND    CPAP Hose: use daily with CPAP machine. DX G47.30  Modified Medications   No medications on file  Discontinued Medications   AMOXICILLIN (AMOXIL) 500 MG TABLET    Take 4 tabs (2 g) amoxicillin po 30 mins before procedure     Physical Exam:  Vitals:   07/20/17 0844  BP: (!) 144/78  Pulse: 63  Temp: 98.1 F (36.7 C)  TempSrc: Oral  SpO2: 97%  Weight: (!) 323 lb 6.4 oz (146.7 kg)  Height: '5\' 9"'$  (1.753 m)   Body mass index is 47.76 kg/m.  Physical Exam  Constitutional: He appears well-developed and well-nourished.  HENT:  Head: Normocephalic and atraumatic.  Eyes: Pupils are equal, round, and reactive to light.  Neck: Normal range of motion. Neck supple. No JVD present.  Cardiovascular: Normal rate, regular rhythm and normal heart sounds.  Pulmonary/Chest: Effort normal. He has decreased breath sounds.  Abdominal: Soft. Bowel sounds are normal.  Musculoskeletal: Normal range of motion. He exhibits no edema.  Skin: Skin is warm and dry.    Labs reviewed: Basic Metabolic  Panel: Recent Labs    02/04/17 1110  06/01/17 0236  06/03/17 0338 06/29/17 0755 07/15/17 0917  NA 142   < >  --    < > 141 141 145*  K 3.9   < >  --    < > 3.6 3.9 3.8  CL 101   < >  --    < > 107 102 104  CO2 30   < >  --    < > '25 23 24  '$ GLUCOSE 255*   < >  --    < > 237* 182* 183*  BUN 16   < >  --    < > '18 16 13  '$ CREATININE 1.07   < >  --    < > 1.37* 1.01 0.95  CALCIUM 9.1   < >  --    < > 8.3* 9.0 8.8  MG  --   --  1.7  --  2.1  --   --   TSH 1.08  --  1.471  --   --   --   --    < > = values in this interval not displayed.   Liver Function Tests: Recent Labs    11/19/16 1240 12/10/16 0752 02/04/17 1110 05/04/17 0812 05/31/17 1758  AST 15 18 8* 10 14*  ALT 21 17 7* 9 13*  ALKPHOS 63 71  --   --  67  BILITOT 1.0 0.90 0.6 0.6 0.7  PROT 7.2 8.2* 7.2 7.0 7.6  ALBUMIN 3.7 3.5  --   --  3.6   No results for input(s): LIPASE, AMYLASE in the last 8760 hours. No results for input(s): AMMONIA in the last 8760 hours. CBC: Recent Labs    02/19/17 1310 03/13/17 1246  05/31/17 1758  06/02/17 0751 06/03/17 0338 06/29/17 0755  WBC 19.3* 8.7   < > 8.0 7.7 7.3 7.2  NEUTROABS 17.6* 6.6  --  5.8  --   --   --   HGB 13.9 11.9*   < > 12.0* 11.0* 11.4* 12.0*  HCT 42.7 37.3*   < > 37.0* 35.7* 36.6* 36.2*  MCV 81.8 82.7   < > 84.3 85.8 86.1 83  PLT 292 310   < > 275 275 286 307   < > = values in this interval not displayed.   Lipid Panel: Recent Labs    09/22/16 1032 05/04/17 0812 06/01/17 0236  CHOL 193 146 127  HDL 62 47 44  LDLCALC 112* 80 64  TRIG 95 104 96  CHOLHDL 3.1 3.1 2.9   TSH: Recent Labs    02/04/17 1110 06/01/17 0236  TSH 1.08 1.471   A1C: Lab Results  Component Value Date   HGBA1C 7.1 (H) 06/01/2017     Assessment/Plan 1. Primary osteoarthritis of both knees - traMADol (ULTRAM) 50 MG tablet; Take 1 tablet (50 mg total) by mouth every 6 (six) hours as needed. for pain  Dispense: 30 tablet; Refill: 0  2. OSA (obstructive sleep  apnea) -Having to get parts replaced to CPAP machine through [facility], most recently they recommended new machine, Rx written  3. Essential hypertension -has not taken medication this morning. Will continue current regimen at this time.  4. Type 2 diabetes mellitus with chronic kidney disease, without long-term current use of insulin, unspecified CKD stage ([place]) Does not have blood sugar readings but reports they are controlled on metformin and glipizide.   5. Severe obesity (BMI >= 40) (HCC) Unable to exercise at this time due to shortness of breath. Continue to work on diet modifications at this time.   6. Chronic congestive heart failure, unspecified heart failure type ([place]) -stable, appears euvolemic however still with shortness of breath with activity. This has been unchanged since hospitalization.  Plans to follow up with cardiology. Will continue current regimen.  - furosemide (LASIX) 40 MG tablet; Take 1 tablet (40 mg total) by mouth daily.  Dispense: 90 tablet; Refill: 1 - carvedilol (COREG) 12.5 MG tablet; Take 1 tablet (12.5 mg total) by mouth 2 (two) times daily with a meal.  Dispense: 180 tablet; Refill: 1  7. Paroxysmal atrial fibrillation (HCC) Rate controlled at this time. Continues on xarelto and coreg  8. OSA on CPAP -needing replacement cpap but does not have recent sleep study, will send to pulmonary for further evaluation   Next appt: 09/06/2017 for labs then follow up with Dr Sofie Hartigan. [place], [facility] [phone]

## 2017-07-20 NOTE — Addendum Note (Signed)
Addended by: [name] on: 07/20/2017 03:11 PM   Modules accepted: Orders

## 2017-07-20 NOTE — Telephone Encounter (Signed)
I left a message for patient to call the office to discuss his BIPAP supplies.   A fax was received from [facility] stating that patient was provided a BIPAP in 2008 which was set at 24/20. They have only provided supplies a few times since that time.   Patient stated that he was washing and reusing his old supplies because he was unaware that he could order new.   Patient was informed that a pulmonary referral would have to be placed in order to obtain a new diagnostic sleep study and titration study that shows failed cpap and movement to bipap with signed interpretation.

## 2017-07-26 MED FILL — FUROSEMIDE 40 MG TAB: 40 | 90 days supply | Qty: 90 | Fill #0

## 2017-07-27 ENCOUNTER — Encounter: Payer: Self-pay | Admitting: Interventional Cardiology

## 2017-07-27 ENCOUNTER — Ambulatory Visit: Payer: Medicare HMO | Admitting: Interventional Cardiology

## 2017-07-27 VITALS — BP 122/72 | HR 65 | Ht 69.0 in | Wt 330.0 lb

## 2017-07-27 DIAGNOSIS — I272 Pulmonary hypertension, unspecified: Secondary | ICD-10-CM | POA: Diagnosis not present

## 2017-07-27 DIAGNOSIS — I5042 Chronic combined systolic (congestive) and diastolic (congestive) heart failure: Secondary | ICD-10-CM

## 2017-07-27 DIAGNOSIS — Z7901 Long term (current) use of anticoagulants: Secondary | ICD-10-CM | POA: Diagnosis not present

## 2017-07-27 MED ORDER — FUROSEMIDE 40 MG PO TABS: 40.0000 mg | ORAL_TABLET | Freq: Every day | ORAL | 3 refills | Status: DC

## 2017-07-27 NOTE — Patient Instructions (Addendum)
Medication Instructions:  Your physician recommends that you continue on your current medications as directed. Please refer to the Current Medication list given to you today.  Take furosemide (lasix) 40 mg TWICE A DAY for 2 Days, then go back to taking 40 mg ONCE A DAY  Labwork: None ordered  Testing/Procedures: None ordered  Follow-Up: Your physician wants you to follow-up in: 6 months with Dr. Irish Lack. You will receive a reminder letter in the mail two months in advance. If you don't receive a letter, please call our office to schedule the follow-up appointment.   Any Other Special Instructions Will Be Listed Below (If Applicable).     If you need a refill on your cardiac medications before your next appointment, please call your pharmacy.

## 2017-07-27 NOTE — Progress Notes (Signed)
Cardiology Office Note   Date:  07/27/2017   ID:  Thomas [name], DOB 11-22-1950, MRN [id number]  PCP:  [name], NP    No chief complaint on file.  Nonischemic cardiomyopathy/ chronic systolic heart failure  Wt Readings from Last 3 Encounters:  07/27/17 (!) 330 lb (149.7 kg)  07/20/17 (!) 323 lb 6.4 oz (146.7 kg)  07/06/17 (!) 318 lb (144.2 kg)       History of Present Illness: Thomas [name] is a 67 y.o. male with a history of a nonischemic cardiomyopathy.  He had considered weight loss surgery in 2008.  Insurance changed and he could not get the surgery.    He had a PE in early 2018.  He has been on Xarelto since that time.    He had TKR in 2018.  Heart cath was done in May 2019.  This revealed:  There is mild left ventricular systolic dysfunction.  LV end diastolic pressure is moderately elevated, 30 mm Hg.  The left ventricular ejection fraction is 45-50% by visual estimate.  There is no aortic valve stenosis.  Hemodynamic findings consistent with moderate to severe pulmonary hypertension.  CO 10.8 L/min; CI 4.2, Ao sat 91%; PA sat 72%; mean PCWP 21 mm Hg  Nonobstructive CAD.  Prox Cx lesion is 25% stenosed.   Non-obstructive CAD.  Moderate to severe pulmonary hypertension.   He continues to have dyspnea on exertion.  He has been out of his Lasix for a few days.  He has not been checking his weight daily.  He does use BiPAP.  The settings were given to him about 10 years ago.  He has not had any other follow-up for his sleep apnea.    Past Medical History:  Diagnosis Date  . Acute on chronic diastolic CHF (congestive heart failure) ([place]) 06/01/2017  . Arthritis   . Cancer [facility]) 2010   Prostate  . Chronic kidney disease   . Diabetes mellitus   . Diabetic neuropathy (HCC)    feet  . DVT (deep venous thrombosis) ([place])   . Genetic testing 06/22/2016   Mr. Hoque underwent genetic counseling and testing for hereditary cancer  syndromes on 05/14/2016. His results were negative for mutations in all 46 genes analyzed by [facility]'s 46-gene Common Hereditary Cancers Panel. Genes analyzed include: APC, ATM, AXIN2, BARD1, BMPR1A, BRCA1, BRCA2, BRIP1, CDH1, CDKN2A, CHEK2, CTNNA1, DICER1, EPCAM, GREM1, HOXB13, KIT, MEN1, MLH1, MSH2, MSH3, MSH6, MUTYH, NB  . GERD (gastroesophageal reflux disease)   . Hypertension   . PE (pulmonary thromboembolism) ([place])   . Pneumonia   . Sleep apnea    not wearing CPAP    Past Surgical History:  Procedure Laterality Date  . CARDIOVERSION N/A 06/03/2017   Procedure: CARDIOVERSION;  Surgeon: Jerline Pain, MD;  Location: [facility] ENDOSCOPY;  Service: Cardiovascular;  Laterality: N/A;  . COLONOSCOPY    . HERNIA REPAIR    . KNEE SURGERY    . MULTIPLE TOOTH EXTRACTIONS    . PROSTATE SURGERY    . RADIOACTIVE SEED IMPLANT    . RIGHT/LEFT HEART CATH AND CORONARY ANGIOGRAPHY N/A 07/06/2017   Procedure: RIGHT/LEFT HEART CATH AND CORONARY ANGIOGRAPHY;  Surgeon: Jettie Booze, MD;  Location: [place] CV LAB;  Service: Cardiovascular;  Laterality: N/A;  . SHOULDER SURGERY    . TOTAL KNEE ARTHROPLASTY Right 11/19/2016   Procedure: RIGHT TOTAL KNEE ARTHROPLASTY;  Surgeon: Leandrew Koyanagi, MD;  Location: [facility];  Service: Orthopedics;  Laterality:  Right;  . uretha surgery-2014       Current Outpatient Medications  Medication Sig Dispense Refill  . AMBULATORY NON FORMULARY MEDICATION Full Face CPAP Mask with Tubing Dx: G47.30 1 each 0  . amLODipine (NORVASC) 10 MG tablet Take 1 tablet (10 mg total) by mouth daily. For high blood pressure 90 tablet 1  . aspirin EC 81 MG tablet Take 81 mg by mouth daily.    Marland Kitchen atorvastatin (LIPITOR) 20 MG tablet Take one tablet by mouth once daily for cholesterol 90 tablet 1  . carvedilol (COREG) 12.5 MG tablet Take 1 tablet (12.5 mg total) by mouth 2 (two) times daily with a meal. 180 tablet 1  . furosemide (LASIX) 40 MG tablet Take 1 tablet (40 mg total) by mouth  daily. 90 tablet 1  . glipiZIDE (GLUCOTROL) 5 MG tablet Take 1 tablet (5 mg total) by mouth daily before breakfast. 90 tablet 1  . hydrALAZINE (APRESOLINE) 25 MG tablet Take 1 tablet (25 mg total) by mouth 3 (three) times daily. 270 tablet 3  . losartan (COZAAR) 100 MG tablet TAKE 1 TABLET (100 MG TOTAL) BY MOUTH DAILY. 90 tablet 1  . metFORMIN (GLUCOPHAGE) 500 MG tablet TAKE 1 TABLET BY MOUTH 2 TIMES A DAY WITH A MEAL for diabetes 180 tablet 3  . omeprazole (PRILOSEC) 20 MG capsule Take 1 capsule (20 mg total) by mouth as needed. 30 capsule 1  . potassium chloride SA (K-DUR,KLOR-CON) 20 MEQ tablet Take 1 tablet (20 mEq total) by mouth 2 (two) times daily. 60 tablet 11  . rivaroxaban (XARELTO) 20 MG TABS tablet Take 1 tablet (20 mg total) by mouth daily with supper. 30 tablet 12  . senna-docusate (SENOKOT S) 8.6-50 MG tablet Take 1 tablet by mouth at bedtime as needed. 30 tablet 1  . traMADol (ULTRAM) 50 MG tablet Take 1 tablet (50 mg total) by mouth every 6 (six) hours as needed. for pain 30 tablet 0  . TRUE METRIX BLOOD GLUCOSE TEST test strip CHECK BLOOD SUGAR TWICE PER DAY 100 each PRN  . TRUEPLUS LANCETS 30G MISC USE TO TEST BLOOD SUGAR 2 TIMES A DAY 100 each 12  . UNABLE TO FIND CPAP Hose: use daily with CPAP machine. DX G47.30 1 each 11   No current facility-administered medications for this visit.     Allergies:   Lisinopril    Social History:  The patient  reports that he has never smoked. He has never used smokeless tobacco. He reports that he does not drink alcohol or use drugs.   Family History:  The patient's family history includes Breast cancer (age of onset: 22) in his sister; Breast cancer (age of onset: 47) in his maternal aunt; Breast cancer (age of onset: 93) in his mother; Cancer (age of onset: 12) in his sister; Cancer (age of onset: 77) in his maternal uncle; Cervical cancer (age of onset: 19) in his other; Leukemia (age of onset: 46) in his brother; Lung cancer in his  maternal uncle; Other (age of onset: 69) in his brother; Prostate cancer in his brother and paternal uncle.    ROS:  Please see the history of present illness.   Otherwise, review of systems are positive for DOE.   All other systems are reviewed and negative.    PHYSICAL EXAM: VS:  BP 122/72   Pulse 65   Ht '5\' 9"'$  (1.753 m)   Wt (!) 330 lb (149.7 kg)   SpO2 91%   BMI 48.73  kg/m  , BMI Body mass index is 48.73 kg/m. GEN: Well nourished, well developed, in no acute distress  HEENT: normal  Neck: no JVD, carotid bruits, or masses Cardiac: RRR; no murmurs, rubs, or gallops,; mild leg edema  Respiratory:  clear to auscultation bilaterally, normal work of breathing GI: soft, nontender, nondistended, + BS MS: no deformity or atrophy  Skin: warm and dry, no rash Neuro:  Strength and sensation are intact Psych: euthymic mood, full affect    Recent Labs: 05/31/2017: ALT 13; B Natriuretic Peptide 450.6 06/01/2017: TSH 1.471 06/03/2017: Magnesium 2.1 06/29/2017: Hemoglobin 12.0; Platelets 307 07/15/2017: BUN 13; Creatinine, Ser 0.95; Potassium 3.8; Sodium 145   Lipid Panel    Component Value Date/Time   CHOL 127 06/01/2017 0236   CHOL 153 03/08/2015 0834   TRIG 96 06/01/2017 0236   HDL 44 06/01/2017 0236   HDL 52 03/08/2015 0834   CHOLHDL 2.9 06/01/2017 0236   VLDL 19 06/01/2017 0236   LDLCALC 64 06/01/2017 0236   LDLCALC 80 05/04/2017 0812     Other studies Reviewed: Additional studies/ records that were reviewed today with results demonstrating: Hospital records reviewed.   ASSESSMENT AND PLAN:  1. Nonischemic cardiomyopathy/chrponic systolic/diastolic heart failure: He has evidence of fluid retention on exam.  He has been off his Lasix.  Restart Lasix at 40 mg twice daily.  Continue potassium supplement. 2. Pulmonary hypertension: He has a referral to pulmonary already in the system.  Continue diuresis.  He may need a higher dose of Lasix going forward.  He may need his sleep  apnea treatment adjusted as well.  Will await to see pulmonary recs. 3. Morbid Obesity: He had consider weight loss surgery in the past.  He would definitely benefit from weight loss. 4. Anticoagulated: History of pulmonary embolism.  Continue Xarelto.   Current medicines are reviewed at length with the patient today.  The patient concerns regarding his medicines were addressed.  The following changes have been made:  Increase lasix for a few days  Labs/ tests ordered today include:  No orders of the defined types were placed in this encounter.   Recommend 150 minutes/week of aerobic exercise Low fat, low carb, high fiber diet recommended  Disposition:   FU in 6 months   Signed, Larae Grooms, MD  07/27/2017 8:55 AM    [facility] [place], [place], [place]  [address] Phone: [phone]; Fax: [phone]

## 2017-08-05 ENCOUNTER — Other Ambulatory Visit: Payer: Medicare HMO

## 2017-08-10 ENCOUNTER — Ambulatory Visit: Payer: Medicare HMO | Admitting: Nurse Practitioner

## 2017-08-10 DIAGNOSIS — G4730 Sleep apnea, unspecified: Secondary | ICD-10-CM | POA: Diagnosis not present

## 2017-08-10 DIAGNOSIS — M1710 Unilateral primary osteoarthritis, unspecified knee: Secondary | ICD-10-CM | POA: Diagnosis not present

## 2017-08-10 DIAGNOSIS — G4733 Obstructive sleep apnea (adult) (pediatric): Secondary | ICD-10-CM | POA: Diagnosis not present

## 2017-08-10 DIAGNOSIS — S838X9A Sprain of other specified parts of unspecified knee, initial encounter: Secondary | ICD-10-CM | POA: Diagnosis not present

## 2017-08-14 DIAGNOSIS — E119 Type 2 diabetes mellitus without complications: Secondary | ICD-10-CM | POA: Diagnosis not present

## 2017-08-14 DIAGNOSIS — R300 Dysuria: Secondary | ICD-10-CM | POA: Diagnosis not present

## 2017-08-14 DIAGNOSIS — A499 Bacterial infection, unspecified: Secondary | ICD-10-CM | POA: Diagnosis not present

## 2017-08-14 DIAGNOSIS — N390 Urinary tract infection, site not specified: Secondary | ICD-10-CM | POA: Diagnosis not present

## 2017-08-17 MED FILL — CARVEDILOL 12.5 MG TABLET: 12.5 | 30 days supply | Qty: 60 | Fill #1

## 2017-08-30 ENCOUNTER — Institutional Professional Consult (permissible substitution): Payer: Medicare HMO | Admitting: Pulmonary Disease

## 2017-08-31 ENCOUNTER — Ambulatory Visit (INDEPENDENT_AMBULATORY_CARE_PROVIDER_SITE_OTHER): Payer: Medicare HMO | Admitting: Pulmonary Disease

## 2017-08-31 ENCOUNTER — Encounter: Payer: Self-pay | Admitting: Pulmonary Disease

## 2017-08-31 VITALS — BP 140/84 | HR 87 | Ht 69.0 in | Wt 337.0 lb

## 2017-08-31 DIAGNOSIS — I5022 Chronic systolic (congestive) heart failure: Secondary | ICD-10-CM | POA: Diagnosis not present

## 2017-08-31 DIAGNOSIS — G4733 Obstructive sleep apnea (adult) (pediatric): Secondary | ICD-10-CM

## 2017-08-31 DIAGNOSIS — Z6841 Body Mass Index (BMI) 40.0 and over, adult: Secondary | ICD-10-CM

## 2017-08-31 DIAGNOSIS — I2724 Chronic thromboembolic pulmonary hypertension: Secondary | ICD-10-CM | POA: Diagnosis not present

## 2017-08-31 DIAGNOSIS — I272 Pulmonary hypertension, unspecified: Secondary | ICD-10-CM

## 2017-08-31 NOTE — Progress Notes (Signed)
[facility]  Chief Complaint  Patient presents with  . sleep consult    referred by PCP for new cpap machine.  no recent sleep studies available.  Epworth Score: 4    Constitutional: BP 140/84 (BP Location: Right Arm, Cuff Size: Normal)   Pulse 87   Ht 5\' 9"  (1.753 m)   Wt (!) 337 lb (152.9 kg)   SpO2 90%   BMI 49.77 kg/m   History of Present Illness: Thomas [name] is a 67 y.o. male with obstructive sleep apnea, systolic CHF, and CTEPH.  He was found to have PE in 2017.  Follow up imaging showed persistent chronic clot burden in right side.  He has been on anticoagulation.  He has been followed by cardiology for non obstructive CAD, systolic CHF, and found to have pulmonary hypertension on RHC.  He was found to have sleep apnea in 2007/2008.  He was treated with Bipap.  He has same machine since then and not sure if it is working.  [facility]  He snores and stops breathing when he doesn't use Bipap.  Has trouble sleeping on his back.  Gets dry mouth at night.   He goes to sleep at 9 pm.  He falls asleep in 30 minutes.  He wakes up several times to use the bathroom.  He gets out of bed at 5 am.  He feels tired sometimes in the morning.  He denies morning headache.  He does not use anything to help him fall sleep or stay awake.  He denies sleep walking, sleep talking, bruxism, or nightmares.  There is no history of restless legs.  He denies sleep hallucinations, sleep paralysis, or cataplexy.  The Epworth score is 4 out of 24.     Comprehensive Respiratory Exam:  Appearance - well kempt  ENMT - nasal mucosa moist, turbinates clear, midline nasal septum, no dental lesions, no gingival bleeding, no oral exudates, no tonsillar hypertrophy, MP 3 Neck - no masses, trachea midline, no thyromegaly, no elevation in JVP Respiratory - normal appearance of chest wall, normal respiratory effort w/o accessory muscle use, no  dullness on percussion, no wheezing or rales CV - s1s2 regular rate and rhythm, no murmurs, no peripheral edema, radial pulses symmetric GI - soft, non tender, no masses, no hepatosplenomegaly Lymph - no adenopathy noted in neck and axillary areas MSK - normal muscle strength and tone, normal gait Ext - no cyanosis, clubbing, or joint inflammation noted Skin - no rashes, lesions, or ulcers Neuro - oriented to person, place, and time Psych - normal mood and affect  Discussion: He has snoring, sleep disruption, apnea, and daytime sleepiness.  His BMI is > 35.  He has hx of OSA, CHF, and CTEPH.    Assessment/Plan:  Snoring with excessive daytime sleepiness. - will need to arrange for a home sleep study  Pulmonary hypertension. - WHO group 2, 3, and 5 - will optimize therapy for sleep apnea first, and then likely f/u Echo to assess change in pulmonary pressures - if pulmonary pressures remain elevated, then will need further assessment for medical therapy specifically for pulmonary hypertension  Obesity. - discussed how weight can impact sleep and risk for sleep disordered breathing - discussed options to assist with weight loss: combination of diet modification, cardiovascular and strength training exercises  Cardiovascular risk. - had an extensive discussion regarding the adverse health consequences related to untreated sleep disordered breathing - specifically discussed the risks  for hypertension, coronary artery disease, cardiac dysrhythmias, cerebrovascular disease, and diabetes - lifestyle modification discussed  Safe driving practices. - discussed how sleep disruption can increase risk of accidents, particularly when driving - safe driving practices were discussed  Therapies for obstructive sleep apnea. - if the sleep study shows significant sleep apnea, then various therapies for treatment were reviewed: CPAP, oral appliance, and surgical interventions - discussed he might  need CPAP versus Bipap, +/- supplemental oxygen    Patient Instructions  Will arrange for home sleep study Will call to arrange for follow up after sleep study reviewed     Chesley Mires, MD [place] 08/31/2017, 9:55 AM  Flow Sheet  Pulmonary tests: CT angio chest 11/23/15 >> PE Rt side CT angio chest 03/03/16 >> chronic PE on Rt CT angio chest 04/02/17 >> atherosclerosis  Sleep tests:  Cardiac tests: Echo 06/02/17 >> EF 45 to 50%, PAS 35 mmHg RHC/LHC 07/06/17 >> non obstructive CAD, mod/severe pulmonary HTN  Review of Systems: Constitutional: Positive for fatigue. Negative for fever and unexpected weight change.  HENT: Negative for congestion, dental problem, ear pain, nosebleeds, postnasal drip, rhinorrhea, sinus pressure, sneezing, sore throat and trouble swallowing.   Eyes: Negative for redness and itching.  Respiratory: Positive for shortness of breath. Negative for cough, chest tightness and wheezing.   Cardiovascular: Negative for palpitations and leg swelling.  Gastrointestinal: Negative for nausea and vomiting.  Genitourinary: Negative for dysuria.  Musculoskeletal: Negative for joint swelling.  Skin: Negative for rash.  Neurological: Negative for headaches.  Hematological: Does not bruise/bleed easily.  Psychiatric/Behavioral: Negative for dysphoric mood. The patient is not nervous/anxious.    Past Medical History: He  has a past medical history of Acute on chronic diastolic CHF (congestive heart failure) ([place]) (06/01/2017), Arthritis, Cancer ([place]) (2010), Chronic kidney disease, Diabetes mellitus, Diabetic neuropathy ([place]), DVT (deep venous thrombosis) ([place]), Genetic testing (06/22/2016), GERD (gastroesophageal reflux disease), Hypertension, PE (pulmonary thromboembolism) ([place]), Pneumonia, and Sleep apnea.  Past Surgical History: He  has a past surgical history that includes Knee surgery; Shoulder surgery; Hernia repair; Prostate surgery; uretha  surgery-2014; Radioactive seed implant; Colonoscopy; Multiple tooth extractions; Total knee arthroplasty (Right, 11/19/2016); Cardioversion (N/A, 06/03/2017); and RIGHT/LEFT HEART CATH AND CORONARY ANGIOGRAPHY (N/A, 07/06/2017).  Family History: His family history includes Breast cancer (age of onset: 65) in his sister; Breast cancer (age of onset: 61) in his maternal aunt; Breast cancer (age of onset: 51) in his mother; Cancer (age of onset: 65) in his sister; Cancer (age of onset: 7) in his maternal uncle; Cervical cancer (age of onset: 78) in his other; Leukemia (age of onset: 35) in his brother; Lung cancer in his maternal uncle; Other (age of onset: 26) in his brother; Prostate cancer in his brother and paternal uncle.  Social History: He  reports that he has never smoked. He has never used smokeless tobacco. He reports that he does not drink alcohol or use drugs.  Medications: Allergies as of 08/31/2017      Reactions   Lisinopril Cough      Medication List        Accurate as of 08/31/17  9:55 AM. Always use your most recent med list.          AMBULATORY NON FORMULARY MEDICATION Full Face CPAP Mask with Tubing Dx: G47.30   amLODipine 10 MG tablet Commonly known as:  NORVASC Take 1 tablet (10 mg total) by mouth daily. For high blood pressure   aspirin EC 81 MG tablet  Take 81 mg by mouth daily.   atorvastatin 20 MG tablet Commonly known as:  LIPITOR Take one tablet by mouth once daily for cholesterol   carvedilol 12.5 MG tablet Commonly known as:  COREG Take 1 tablet (12.5 mg total) by mouth 2 (two) times daily with a meal.   furosemide 40 MG tablet Commonly known as:  LASIX Take 1 tablet (40 mg total) by mouth daily.   glipiZIDE 5 MG tablet Commonly known as:  GLUCOTROL Take 1 tablet (5 mg total) by mouth daily before breakfast.   hydrALAZINE 25 MG tablet Commonly known as:  APRESOLINE Take 1 tablet (25 mg total) by mouth 3 (three) times daily.   losartan 100 MG  tablet Commonly known as:  COZAAR TAKE 1 TABLET (100 MG TOTAL) BY MOUTH DAILY.   metFORMIN 500 MG tablet Commonly known as:  GLUCOPHAGE TAKE 1 TABLET BY MOUTH 2 TIMES A DAY WITH A MEAL for diabetes   omeprazole 20 MG capsule Commonly known as:  PRILOSEC Take 1 capsule (20 mg total) by mouth as needed.   potassium chloride SA 20 MEQ tablet Commonly known as:  K-DUR,KLOR-CON Take 1 tablet (20 mEq total) by mouth 2 (two) times daily.   rivaroxaban 20 MG Tabs tablet Commonly known as:  XARELTO Take 1 tablet (20 mg total) by mouth daily with supper.   senna-docusate 8.6-50 MG tablet Commonly known as:  SENOKOT S Take 1 tablet by mouth at bedtime as needed.   traMADol 50 MG tablet Commonly known as:  ULTRAM Take 1 tablet (50 mg total) by mouth every 6 (six) hours as needed. for pain   TRUE METRIX BLOOD GLUCOSE TEST test strip Generic drug:  glucose blood CHECK BLOOD SUGAR TWICE PER DAY   TRUEPLUS LANCETS 30G Misc USE TO TEST BLOOD SUGAR 2 TIMES A DAY   UNABLE TO FIND CPAP Hose: use daily with CPAP machine. DX G47.30

## 2017-08-31 NOTE — Progress Notes (Signed)
   Subjective:    Patient ID: Thomas [name], male    DOB: [birth year], 67 y.o.   MRN: [id number]  HPI    Review of Systems  Constitutional: Positive for fatigue. Negative for fever and unexpected weight change.  HENT: Negative for congestion, dental problem, ear pain, nosebleeds, postnasal drip, rhinorrhea, sinus pressure, sneezing, sore throat and trouble swallowing.   Eyes: Negative for redness and itching.  Respiratory: Positive for shortness of breath. Negative for cough, chest tightness and wheezing.   Cardiovascular: Negative for palpitations and leg swelling.  Gastrointestinal: Negative for nausea and vomiting.  Genitourinary: Negative for dysuria.  Musculoskeletal: Negative for joint swelling.  Skin: Negative for rash.  Neurological: Negative for headaches.  Hematological: Does not bruise/bleed easily.  Psychiatric/Behavioral: Negative for dysphoric mood. The patient is not nervous/anxious.        Objective:   Physical Exam        Assessment & Plan:

## 2017-08-31 NOTE — Patient Instructions (Signed)
Will arrange for home sleep study Will call to arrange for follow up after sleep study reviewed  

## 2017-09-06 ENCOUNTER — Other Ambulatory Visit: Payer: Medicare HMO

## 2017-09-08 DIAGNOSIS — G4733 Obstructive sleep apnea (adult) (pediatric): Secondary | ICD-10-CM | POA: Diagnosis not present

## 2017-09-09 ENCOUNTER — Other Ambulatory Visit: Payer: Self-pay | Admitting: *Deleted

## 2017-09-09 DIAGNOSIS — M1710 Unilateral primary osteoarthritis, unspecified knee: Secondary | ICD-10-CM | POA: Diagnosis not present

## 2017-09-09 DIAGNOSIS — S838X9A Sprain of other specified parts of unspecified knee, initial encounter: Secondary | ICD-10-CM | POA: Diagnosis not present

## 2017-09-09 DIAGNOSIS — G4733 Obstructive sleep apnea (adult) (pediatric): Secondary | ICD-10-CM

## 2017-09-09 DIAGNOSIS — G4730 Sleep apnea, unspecified: Secondary | ICD-10-CM | POA: Diagnosis not present

## 2017-09-10 ENCOUNTER — Telehealth: Payer: Self-pay | Admitting: Pulmonary Disease

## 2017-09-10 DIAGNOSIS — G4733 Obstructive sleep apnea (adult) (pediatric): Secondary | ICD-10-CM

## 2017-09-10 NOTE — Telephone Encounter (Signed)
Attempted to call patient today regarding results. I did not receive an answer at time of call. I have left a voicemail message for pt to return call. X1  

## 2017-09-10 NOTE — Telephone Encounter (Signed)
HST 09/08/17 >> AHI 77.9, SaO2 low 70%   Will have my nurse inform pt that sleep study shows very severe sleep apnea.  Please schedule in lab CPAP titration study to determine optimal therapy for his severe sleep apnea.

## 2017-09-13 NOTE — Telephone Encounter (Signed)
Spoke with patient regarding results. Informed the patient of results and recommendations today. Pt verbalized understanding and denied any questions or concerns at this time.  Placed order for in lab sleep titration study Nothing further needed.

## 2017-09-15 ENCOUNTER — Ambulatory Visit: Payer: Medicare HMO | Admitting: Internal Medicine

## 2017-09-15 MED FILL — AMLODIPINE BESYLATE 10 MG T: 10 | 90 days supply | Qty: 90 | Fill #1

## 2017-09-15 MED FILL — hydrALAZINE HCL 25 MG TABS: 25 | 90 days supply | Qty: 270 | Fill #1

## 2017-09-15 MED FILL — CARVEDILOL 12.5 MG TABLET: 12.5 | 90 days supply | Qty: 180 | Fill #0

## 2017-09-18 ENCOUNTER — Ambulatory Visit (HOSPITAL_BASED_OUTPATIENT_CLINIC_OR_DEPARTMENT_OTHER): Payer: Medicare HMO | Attending: Pulmonary Disease | Admitting: Pulmonary Disease

## 2017-09-18 VITALS — Ht 69.0 in | Wt 330.0 lb

## 2017-09-18 DIAGNOSIS — G4733 Obstructive sleep apnea (adult) (pediatric): Secondary | ICD-10-CM | POA: Diagnosis not present

## 2017-09-28 ENCOUNTER — Ambulatory Visit: Payer: Medicare HMO

## 2017-09-28 ENCOUNTER — Telehealth: Payer: Self-pay | Admitting: Pulmonary Disease

## 2017-09-28 NOTE — Telephone Encounter (Signed)
Spoke with pt. He is inquiring about his CPAP titration study results. Advised him that per his chart, the results are still in process. Advised him that we would call him as soon as they are available. Pt verbalized understanding. Nothing further was needed at this time.

## 2017-09-30 ENCOUNTER — Telehealth: Payer: Self-pay | Admitting: Pulmonary Disease

## 2017-09-30 DIAGNOSIS — G4733 Obstructive sleep apnea (adult) (pediatric): Secondary | ICD-10-CM

## 2017-09-30 NOTE — Procedures (Signed)
   Patient Name: Thomas [name], [place] Date: 09/18/2017   Gender: Male  D.O.B: 08/21/1950  Age (years): 48  Referring Provider: Chesley Mires MD, ABSM  Height (inches): 55  Interpreting Physician: Chesley Mires MD, ABSM  Weight (lbs): 330  RPSGT: [place], [place]  BMI: 49  MRN: [id number]  Neck Size: 20.00  CLINICAL INFORMATION  The patient is referred for a BiPAP titration to treat sleep apnea. He has a history of congestive heart failure and chronic thromboembolic pulmonary hypertension. Date of HST: 09/08/17, AHI 77.9 and SaO2 low 70%.  SLEEP STUDY TECHNIQUE  As per the AASM Manual for the Scoring of Sleep and Associated Events v2.3 (April 2016) with a hypopnea requiring 4% desaturations. The channels recorded and monitored were frontal, central and occipital EEG, electrooculogram (EOG), submentalis EMG (chin), nasal and oral airflow, thoracic and abdominal wall motion, anterior tibialis EMG, snore microphone, electrocardiogram, and pulse oximetry. Bilevel positive airway pressure (BPAP) was initiated at the beginning of the study and titrated to treat sleep-disordered breathing. MEDICATIONS  Medications self-administered by patient taken the night of the study : N/A RESPIRATORY PARAMETERS  Optimal IPAP Pressure (cm):  19 AHI at Optimal Pressure (/hr) 8.5  Optimal EPAP Pressure (cm): 13      Overall Minimal O2 (%):  81.0 Minimal O2 at Optimal Pressure (%): 88.0    He was started on CPAP. He had persistent obstructive and central respiratory events with oxygen desaturation. He failed attempt with CPAP. He was transitioned to Bipap, and did much better once he was titrated to appropriate pressure setting. He did not require the use of supplemental oxygen during the study. SLEEP ARCHITECTURE  Start Time: 10:41:14 PM Stop Time: 4:32:45 AM Total Time (min): 351.5 Total Sleep Time (min): 216  Sleep Latency (min): 23.3 Sleep Efficiency (%): 61.4% REM Latency (min): 224.0 WASO (min):  112.2  Stage N1 (%): 29.9% Stage N2 (%): 66.7% Stage N3 (%): 0.0% Stage R (%): 3.5  Supine (%): 16.90 Arousal Index (/hr): 48.3          CARDIAC DATA  The 2 lead EKG demonstrated sinus rhythm. The mean heart rate was 63.4 beats per minute. Other EKG findings include: PVCs.  LEG MOVEMENT DATA  The total Periodic Limb Movements of Sleep (PLMS) were 0. The PLMS index was 0.0. A PLMS index of <15 is considered normal in adults. IMPRESSIONS  - He was not able to be titrated using CPAP due to persistent obstructive and respiratory events. He failed CPAP. - He did well with Bipap 19/13 cm H2O. He did not require supplemental oxygen during this study. DIAGNOSIS  - Obstructive Sleep Apnea (327.23 [G47.33 ICD-10]) RECOMMENDATIONS  - Trial of BiPAP therapy on 19/13 cm H2O with a Large size [employer] Full Face Mask Simplus mask and heated humidification. [Electronically signed] 09/30/2017 08:39 AM Chesley Mires MD, ABSM  Diplomate, American Board of Sleep Medicine  NPI: [id number]

## 2017-09-30 NOTE — Telephone Encounter (Signed)
Called and spoke with patient regarding results.  Informed the patient of results and recommendations today. Placed order for Bipap 19/13 cm H2O with heated humidity and mask of choice. Scheduled 6mo appt for 12/14/2017 at 9am Pt verbalized understanding and denied any questions or concerns at this time.  Nothing further needed.

## 2017-09-30 NOTE — Telephone Encounter (Signed)
Bipap titration 09/18/17 >> Bipap 19/13 cm H2O >> AHI 8.5, didn't need supplemental oxygen.    Please let him know he did well with Bipap during the sleep study and didn't need supplemental oxygen.  Please arrange for Bipap 19/13 cm H2O with heated humidity and mask of choice.  Schedule ROV with me or NP in 2 months after Bipap set up.

## 2017-10-05 ENCOUNTER — Telehealth: Payer: Self-pay | Admitting: Pulmonary Disease

## 2017-10-05 DIAGNOSIS — G4733 Obstructive sleep apnea (adult) (pediatric): Secondary | ICD-10-CM

## 2017-10-05 NOTE — Telephone Encounter (Signed)
We will need a new order please

## 2017-10-05 NOTE — Telephone Encounter (Signed)
error 

## 2017-10-05 NOTE — Telephone Encounter (Addendum)
Spoke with patient, he is aware that a new order will be placed. Patient then stated that his brother uses a company off of [employer] and he would like to use the same company.   Patient will call back once he has received the name from his brother.   Will leave this encounter open until then.   Patient called back. He stated that he would like for this order to go to [facility]. Advised him I would send the order today. He verbalized understanding. Nothing further needed at time of call.

## 2017-10-09 ENCOUNTER — Encounter (HOSPITAL_BASED_OUTPATIENT_CLINIC_OR_DEPARTMENT_OTHER): Payer: Medicare HMO

## 2017-10-11 ENCOUNTER — Other Ambulatory Visit: Payer: Self-pay | Admitting: Nurse Practitioner

## 2017-10-11 ENCOUNTER — Telehealth: Payer: Self-pay | Admitting: Pulmonary Disease

## 2017-10-11 DIAGNOSIS — M170 Bilateral primary osteoarthritis of knee: Secondary | ICD-10-CM

## 2017-10-11 MED FILL — LOSARTAN POTASSIUM 100 MG T: 100 | 90 days supply | Qty: 90 | Fill #1

## 2017-10-11 MED FILL — traMADol HCL 50 MG TABS: 50 | 8 days supply | Qty: 30 | Fill #0

## 2017-10-11 NOTE — Telephone Encounter (Signed)
Called patient unable to reach left message to give us a call back.

## 2017-10-13 NOTE — Telephone Encounter (Signed)
Inhaler wouldn't be indicated for what he has.  Inhaler would be used for asthma and/or COPD.  I am not aware of him having either condition.  If he feels like he needs an inhaler to help his breathing, then please schedule ROV with me or NP to review in more detail.

## 2017-10-13 NOTE — Telephone Encounter (Signed)
Called and spoke with patient, patient states at his appt 08/31/2017 he was told that he would be given a maintenance inhaler after his sleep study. Cpap has been ordered via [facility].   VS please advise as to whether you would like to start patient on an inhaler? Next appt 12/14/2017 @ 9am.

## 2017-10-13 NOTE — Telephone Encounter (Signed)
Pt is calling back [phone]

## 2017-10-13 NOTE — Telephone Encounter (Signed)
Left message for patient to call back  

## 2017-10-13 NOTE — Telephone Encounter (Signed)
Spoke with pt. He is aware of VS's response. States that he would like an appointment to discuss starting an inhaler. Pt has been scheduled with Tammy on 10/19/17 at 9am. Pt need a 9:00am appointment on Tuesday. Nothing further was needed.

## 2017-10-15 ENCOUNTER — Telehealth: Payer: Self-pay

## 2017-10-15 NOTE — Telephone Encounter (Signed)
Called patient to try to schedule AWV in the office. He asked for [place] to call back after his appointment with his pulmonologist on 9/3

## 2017-10-19 ENCOUNTER — Ambulatory Visit: Payer: Medicare HMO | Admitting: Adult Health

## 2017-10-19 ENCOUNTER — Encounter: Payer: Self-pay | Admitting: Adult Health

## 2017-10-19 VITALS — BP 138/64 | HR 64 | Ht 70.47 in | Wt 340.2 lb

## 2017-10-19 DIAGNOSIS — J9611 Chronic respiratory failure with hypoxia: Secondary | ICD-10-CM | POA: Insufficient documentation

## 2017-10-19 DIAGNOSIS — I5022 Chronic systolic (congestive) heart failure: Secondary | ICD-10-CM | POA: Diagnosis not present

## 2017-10-19 DIAGNOSIS — R0600 Dyspnea, unspecified: Secondary | ICD-10-CM | POA: Diagnosis not present

## 2017-10-19 DIAGNOSIS — G4730 Sleep apnea, unspecified: Secondary | ICD-10-CM

## 2017-10-19 DIAGNOSIS — R0602 Shortness of breath: Secondary | ICD-10-CM

## 2017-10-19 NOTE — Assessment & Plan Note (Signed)
He does have exertional hypoxemia.  Patient does have oxygen at home.  If encouraged him to be compliant.  Will order a portable oxygen concentrator to help with mobility. Begin on oxygen 2 L.  This is adequate to keep his O2 saturations greater than 90%.Marland Kitchen

## 2017-10-19 NOTE — Assessment & Plan Note (Signed)
Severe sleep apnea.  Patient needs to restart BiPAP.  DME has been contacted to help facilitate delivery of his new BiPAP machine.  Patient is to begin this and wear it each night for at least 4 hours.  Patient is encouraged on compliance and weight loss. Follow-up in 6 weeks with download.

## 2017-10-19 NOTE — Patient Instructions (Addendum)
Begin BIPAP At bedtime  , goal is to wear all night .  Do not drive if sleepy  Work on healthy weight  Wear Oxygen 2l/m with activity  POC order done .  Restart Lasix 40mg  daily as directed.  Follow up with Dr. Halford Chessman  In 6-8 weeks and As needed   Follow up with cardiology as planned  Please contact office for sooner follow up if symptoms do not improve or worsen or seek emergency care

## 2017-10-19 NOTE — Progress Notes (Signed)
Reviewed and agree with assessment/plan.   Raquan Iannone, MD [facility] 02/12/2016, 12:24 PM Pager:  [phone]  

## 2017-10-19 NOTE — Progress Notes (Signed)
$'[name]$  ID: Thomas [name], male    DOB: 11/15/1950, 67 y.o.   MRN: [id number]  Chief Complaint  Patient presents with  . Follow-up    OSA , SOB     Referring provider: [name], NP  HPI: 67 year old male never smoker seen for sleep consult August 31, 2017 to establish for OSA . Past medical history significant for systolic congestive heart failure pulmonary hypertension and CTEPH.  Previous PE in 2017 with follow-up CT showing persistent chronic clot burden on the right side.  He has been on chronic anticoagulation.    TEST [facility] :  Pulmonary tests: CT angio chest 11/23/15 >> PE Rt side CT angio chest 03/03/16 >> chronic PE on Rt CT angio chest 04/02/17 >> atherosclerosis, negative for PE or chronic clot  Sleep tests: Home sleep study September 08, 2017 AHI 77, SaO2 low 70 BiPAP titration study August 2019 optimal control BiPAP 19/13 cm H2O  Cardiac tests: Echo 06/02/17 >> EF 45 to 50%, PAS 35 mmHg RHC/LHC 07/06/17 >> non obstructive CAD, mod/severe pulmonary HTN  10/19/2017 Follow up : OSA , Dyspnea  Patient presents for a follow-up visit.  Patient was seen in July for a sleep consult to establish for sleep apnea.  Was diagnosed with sleep apnea in 2007.  Has been on BiPAP.  It appeared that he did miss some nights of his BiPAP and machine was old.  Patient was set up for a new sleep study that was done on July 24 that showed severe sleep apnea with AHI at 77.  SaO2 low at 70%.  He was set up for a titration study that showed optimal control on BiPAP at 19/13 cm H2O. patient has been ordered a new BiPAP machine but unfortunately has not received this through his DME company.  We have contacted them today to help facilitate this.  Patient was found to have moderate to severe pulmonary hypertension on right heart cath in May of this year.  He has a previous history of PE that was diagnosed in 2017.  He was started on lifelong anticoagulation.  A follow-up CT chest in 2018  showed chronic clot burden.  He was felt to have underlying CTEPH.  A follow-up CT chest February 2019 was negative for PE or chronic clot.  Chest x-ray in April showed mild vascular congestion.  Patient complains of progressive shortness of breath over the last year.  Says that he gets winded with activity.  Especially if he has to walk for prolonged period of time tries to bend over to put on his shoes or goes up any amount of incline or steps.  He has minimum dry cough. He did have a cough in the past but was felt to be related to an ACE inhibitor which resolved after discontinuation of his ACE inhibitor.  Patient denies any wheezing.  He is a never smoker.  He worked in [employer] work.  Spirometry today shows moderate restriction with no airflow obstruction with an FEV1 at 81%, ratio 91 FVC 68%. Mid flows were normal.  Patient has known congestive heart failure with echo in April showing a EF of 45 to 50%.  Diffuse hypokinesis.  Patient has chronic leg swelling.  Says that he is on Lasix but has not taken it for the last week.  Walk test in the office shows O2 saturation drop at 87% on room air.  She says he does have oxygen at home but does not use it.  He does  not have a portable tank. Education was given on medication compliance and oxygen use.   Allergies  Allergen Reactions  . Lisinopril Cough    Immunization History  Administered Date(s) Administered  . Influenza, High Dose Seasonal PF 12/14/2016  . Influenza,inj,Quad PF,6+ Mos 11/22/2013, 12/05/2014, 10/24/2015  . Pneumococcal Conjugate-13 05/23/2014  . Pneumococcal Polysaccharide-23 12/13/2012, 05/21/2016    Past Medical History:  Diagnosis Date  . Acute on chronic diastolic CHF (congestive heart failure) ([place]) 06/01/2017  . Arthritis   . Cancer [facility]) 2010   Prostate  . Chronic kidney disease   . Diabetes mellitus   . Diabetic neuropathy (HCC)    feet  . DVT (deep venous thrombosis) ([place])   . Genetic testing 06/22/2016    Mr. Morrell underwent genetic counseling and testing for hereditary cancer syndromes on 05/14/2016. His results were negative for mutations in all 46 genes analyzed by [facility]'s 46-gene Common Hereditary Cancers Panel. Genes analyzed include: APC, ATM, AXIN2, BARD1, BMPR1A, BRCA1, BRCA2, BRIP1, CDH1, CDKN2A, CHEK2, CTNNA1, DICER1, EPCAM, GREM1, HOXB13, KIT, MEN1, MLH1, MSH2, MSH3, MSH6, MUTYH, NB  . GERD (gastroesophageal reflux disease)   . Hypertension   . PE (pulmonary thromboembolism) ([place])   . Pneumonia   . Sleep apnea    not wearing CPAP    Tobacco History: Social History   Tobacco Use  Smoking Status Never Smoker  Smokeless Tobacco Never Used   Counseling given: Not Answered   Outpatient Medications Prior to Visit  Medication Sig Dispense Refill  . AMBULATORY NON FORMULARY MEDICATION Full Face CPAP Mask with Tubing Dx: G47.30 1 each 0  . amLODipine (NORVASC) 10 MG tablet Take 1 tablet (10 mg total) by mouth daily. For high blood pressure 90 tablet 1  . aspirin EC 81 MG tablet Take 81 mg by mouth daily.    Marland Kitchen atorvastatin (LIPITOR) 20 MG tablet Take one tablet by mouth once daily for cholesterol 90 tablet 1  . carvedilol (COREG) 12.5 MG tablet Take 1 tablet (12.5 mg total) by mouth 2 (two) times daily with a meal. 180 tablet 1  . glipiZIDE (GLUCOTROL) 5 MG tablet Take 1 tablet (5 mg total) by mouth daily before breakfast. 90 tablet 1  . hydrALAZINE (APRESOLINE) 25 MG tablet Take 1 tablet (25 mg total) by mouth 3 (three) times daily. 270 tablet 3  . losartan (COZAAR) 100 MG tablet TAKE 1 TABLET (100 MG TOTAL) BY MOUTH DAILY. 90 tablet 1  . metFORMIN (GLUCOPHAGE) 500 MG tablet TAKE 1 TABLET BY MOUTH 2 TIMES A DAY WITH A MEAL for diabetes 180 tablet 3  . omeprazole (PRILOSEC) 20 MG capsule Take 1 capsule (20 mg total) by mouth as needed. 30 capsule 1  . potassium chloride SA (K-DUR,KLOR-CON) 20 MEQ tablet Take 1 tablet (20 mEq total) by mouth 2 (two) times daily. 60 tablet 11  .  rivaroxaban (XARELTO) 20 MG TABS tablet Take 1 tablet (20 mg total) by mouth daily with supper. 30 tablet 12  . senna-docusate (SENOKOT S) 8.6-50 MG tablet Take 1 tablet by mouth at bedtime as needed. 30 tablet 1  . traMADol (ULTRAM) 50 MG tablet TAKE 1 TABLET BY MOUTH EVERY 6 HOURS AS NEEDED. FOR PAIN 30 tablet 0  . TRUE METRIX BLOOD GLUCOSE TEST test strip CHECK BLOOD SUGAR TWICE PER DAY 100 each PRN  . TRUEPLUS LANCETS 30G MISC USE TO TEST BLOOD SUGAR 2 TIMES A DAY 100 each 12  . UNABLE TO FIND CPAP Hose: use daily with CPAP machine.  DX G47.30 1 each 11  . furosemide (LASIX) 40 MG tablet Take 1 tablet (40 mg total) by mouth daily. (Patient not taking: Reported on 10/19/2017) 90 tablet 3   No facility-administered medications prior to visit.      Review of Systems  Constitutional:   No  weight loss, night sweats,  Fevers, chills, + fatigue, or  lassitude.  HEENT:   No headaches,  Difficulty swallowing,  Tooth/dental problems, or  Sore throat,                No sneezing, itching, ear ache, nasal congestion, post nasal drip,   CV:  No chest pain,  Orthopnea, PND, +swelling in lower extremities, anasarca, dizziness, palpitations, syncope.   GI  No heartburn, indigestion, abdominal pain, nausea, vomiting, diarrhea, change in bowel habits, loss of appetite, bloody stools.   Resp: No wheezing.  No chest wall deformity  Skin: no rash or lesions.  GU: no dysuria, change in color of urine, no urgency or frequency.  No flank pain, no hematuria   MS:  No joint pain or swelling.  No decreased range of motion.  No back pain.    Physical Exam  BP 138/64 (BP Location: Left Arm, Patient Position: Sitting, Cuff Size: Normal)   Pulse 64   Ht 5' 10.47" (1.79 m)   Wt (!) 340 lb 3.2 oz (154.3 kg)   SpO2 95%   BMI 48.16 kg/m   GEN: A/Ox3; pleasant , NAD, morbidly obese    HEENT:  [place]/AT,  EACs-clear, TMs-wnl, NOSE-clear, THROAT-clear, no lesions, no postnasal drip or exudate noted.   NECK:   Supple w/ fair ROM; no JVD; normal carotid impulses w/o bruits; no thyromegaly or nodules palpated; no lymphadenopathy.    RESP  Clear  P & A; w/o, wheezes/ rales/ or rhonchi. no accessory muscle use, no dullness to percussion  CARD:  RRR, no m/r/g, tr -1+ peripheral edema, pulses intact, no cyanosis or clubbing.  GI:   Soft & nt; nml bowel sounds; no organomegaly or masses detected.   Musco: Warm bil, no deformities or joint swelling noted.   Neuro: alert, no focal deficits noted.    Skin: Warm, no lesions or rashes    Lab Results:  CBC    Component Value Date/Time   WBC 7.2 06/29/2017 0755   WBC 7.3 06/03/2017 0338   RBC 4.39 06/29/2017 0755   RBC 4.25 06/03/2017 0338   HGB 12.0 (L) 06/29/2017 0755   HGB 12.5 (L) 12/10/2016 0752   HCT 36.2 (L) 06/29/2017 0755   HCT 39.1 12/10/2016 0752   PLT 307 06/29/2017 0755   MCV 83 06/29/2017 0755   MCV 85 12/10/2016 0752   MCH 27.3 06/29/2017 0755   MCH 26.8 06/03/2017 0338   MCHC 33.1 06/29/2017 0755   MCHC 31.1 06/03/2017 0338   RDW 14.3 06/29/2017 0755   RDW 14.9 12/10/2016 0752   LYMPHSABS 1.7 05/31/2017 1758   LYMPHSABS 1.5 12/10/2016 0752   MONOABS 0.5 05/31/2017 1758   EOSABS 0.0 05/31/2017 1758   EOSABS 0.0 12/10/2016 0752   BASOSABS 0.0 05/31/2017 1758   BASOSABS 0.0 12/10/2016 0752    BMET    Component Value Date/Time   NA 145 (H) 07/15/2017 0917   NA 149 (H) 12/10/2016 0752   NA 141 04/07/2016 1130   K 3.8 07/15/2017 0917   K 4.3 12/10/2016 0752   K 4.1 04/07/2016 1130   CL 104 07/15/2017 0917   CL 105 12/10/2016 0752  CO2 24 07/15/2017 0917   CO2 32 12/10/2016 0752   CO2 22 04/07/2016 1130   GLUCOSE 183 (H) 07/15/2017 0917   GLUCOSE 237 (H) 06/03/2017 0338   GLUCOSE 217 (H) 12/10/2016 0752   BUN 13 07/15/2017 0917   BUN 17 12/10/2016 0752   BUN 12.2 04/07/2016 1130   CREATININE 0.95 07/15/2017 0917   CREATININE 1.07 05/04/2017 0812   CREATININE 1.6 (H) 04/07/2016 1130   CALCIUM 8.8 07/15/2017  0917   CALCIUM 9.3 12/10/2016 0752   CALCIUM 9.3 04/07/2016 1130   GFRNONAA 82 07/15/2017 0917   GFRNONAA 72 05/04/2017 0812   GFRAA 95 07/15/2017 0917   GFRAA 83 05/04/2017 0812    BNP    Component Value Date/Time   BNP 450.6 (H) 05/31/2017 1758    ProBNP    Component Value Date/Time   PROBNP 362.0 (H) 12/06/2009 1045    Imaging: No results found.   Assessment & Plan:   Sleep apnea Severe sleep apnea.  Patient needs to restart BiPAP.  DME has been contacted to help facilitate delivery of his new BiPAP machine.  Patient is to begin this and wear it each night for at least 4 hours.  Patient is encouraged on compliance and weight loss. Follow-up in 6 weeks with download.   Chronic systolic heart failure (HCC) Cont follow up with Cards  Restart lasix as directed   Dyspnea Progressive dyspnea x1 year suspect this is multifactorial as patient has underlying chronic systolic congestive heart failure moderate to severe pulmonary hypertension exertional hypoxemia most likely secondary to his underlying pulmonary hypertension and congestive heart failure.  Along with morbid obesity and deconditioning. She has never smoker.  It appears that he does not have any underlying airflow obstruction.  Doubtful that this is underlying asthma as no significant cough or wheezing.  She does have a moderate restriction on his spirometry.  Suspect this is secondary to morbid obesity.  Will check chest x-ray today.  Although he has had a chest x-ray and CT scan in the last 6 months that was unrevealing except for some mild vascular congestion.  Previously patient has had a PE and suspected CTEPH.  Although follow-up CT scan earlier this year did not show any evidence of chronic clot.  Patient does have pretty significant pulmonary hypertension which I suspect is secondary to his uncontrolled underlying severe sleep apnea.  And resultant hypoxemia as well.  Have encouraged him with compliance on his  diuretics oxygen use and BiPAP. Once patient is back on BiPAP and compliant with medications if remains significantly symptomatic.  May need to look further at pulmonary hypertension and possible targeted treatment.. For now would treat underlying causes that are modifiable.   Chronic respiratory failure with hypoxia (HCC) He does have exertional hypoxemia.  Patient does have oxygen at home.  If encouraged him to be compliant.  Will order a portable oxygen concentrator to help with mobility. Begin on oxygen 2 L.  This is adequate to keep his O2 saturations greater than 90%.Rexene Edison, NP 10/19/2017

## 2017-10-19 NOTE — Assessment & Plan Note (Signed)
Cont follow up with Cards  Restart lasix as directed

## 2017-10-19 NOTE — Assessment & Plan Note (Signed)
Progressive dyspnea x1 year suspect this is multifactorial as patient has underlying chronic systolic congestive heart failure moderate to severe pulmonary hypertension exertional hypoxemia most likely secondary to his underlying pulmonary hypertension and congestive heart failure.  Along with morbid obesity and deconditioning. She has never smoker.  It appears that he does not have any underlying airflow obstruction.  Doubtful that this is underlying asthma as no significant cough or wheezing.  She does have a moderate restriction on his spirometry.  Suspect this is secondary to morbid obesity.  Will check chest x-ray today.  Although he has had a chest x-ray and CT scan in the last 6 months that was unrevealing except for some mild vascular congestion.  Previously patient has had a PE and suspected CTEPH.  Although follow-up CT scan earlier this year did not show any evidence of chronic clot.  Patient does have pretty significant pulmonary hypertension which I suspect is secondary to his uncontrolled underlying severe sleep apnea.  And resultant hypoxemia as well.  Have encouraged him with compliance on his diuretics oxygen use and BiPAP. Once patient is back on BiPAP and compliant with medications if remains significantly symptomatic.  May need to look further at pulmonary hypertension and possible targeted treatment.. For now would treat underlying causes that are modifiable.

## 2017-10-19 NOTE — Addendum Note (Signed)
Addended by: Darreld Mclean on: 10/19/2017 05:06 PM   Modules accepted: Orders

## 2017-10-20 ENCOUNTER — Telehealth: Payer: Self-pay | Admitting: Nurse Practitioner

## 2017-10-20 NOTE — Telephone Encounter (Signed)
I called the patient to reschedule his AWV and labs before his CPE on 10/26/17.  He said that it has to be a Tuesday or Thursday.  No T/Th availability on Sara's schedule before 10/26/17. VDM (DD)

## 2017-10-21 ENCOUNTER — Telehealth: Payer: Self-pay | Admitting: Adult Health

## 2017-10-21 NOTE — Telephone Encounter (Signed)
Pt is returning call. Cb is [phone].

## 2017-10-21 NOTE — Telephone Encounter (Signed)
ATC pt, no answer. Left message for pt to call back.  I called [facility] and spoke with Bethanne Ginger and she stated that they have all the information from [place] that they need. He needs a titration and this is done by them. She reassured that there was nothing further needed and that she had spoke with pt today about this matter. Will await return call from pt so I can advise.

## 2017-10-21 NOTE — Telephone Encounter (Signed)
Spoke with pt, advised him that [facility] has all the information they needed from [place]. Pt understood and nothing further is needed.

## 2017-10-26 ENCOUNTER — Encounter: Payer: Medicare HMO | Admitting: Internal Medicine

## 2017-10-26 ENCOUNTER — Telehealth: Payer: Self-pay | Admitting: Adult Health

## 2017-10-26 DIAGNOSIS — G4733 Obstructive sleep apnea (adult) (pediatric): Secondary | ICD-10-CM | POA: Diagnosis not present

## 2017-10-26 DIAGNOSIS — J9611 Chronic respiratory failure with hypoxia: Secondary | ICD-10-CM

## 2017-10-26 NOTE — Telephone Encounter (Signed)
Spoke with [facility] and they need a new order for the regular tanks instead of POC because the POC was not approved for some reason (Gilda wasn't available) according to the notes. Order placed and nothing further is needed.

## 2017-10-27 DIAGNOSIS — J9611 Chronic respiratory failure with hypoxia: Secondary | ICD-10-CM | POA: Diagnosis not present

## 2017-10-27 DIAGNOSIS — I2699 Other pulmonary embolism without acute cor pulmonale: Secondary | ICD-10-CM | POA: Diagnosis not present

## 2017-10-29 ENCOUNTER — Telehealth: Payer: Self-pay

## 2017-10-29 NOTE — Telephone Encounter (Signed)
Recommend you adjust your CPAP straps; may need cervical spine xray to further evaluate pain; cannot give direction on numbness without imaging studies

## 2017-10-29 NOTE — Telephone Encounter (Signed)
Patient called and stated that he was having some soreness and neck pain behind his right ear due to his CPAP being too tight, so he placed a frozen bottle of water on the sore area and went to sleep for 2 hours. He is now having numbness, tingling, and soreness at the area for the last 2 days.    He would like to know what he should do to relieve numbness

## 2017-10-29 NOTE — Telephone Encounter (Signed)
Patient stated that he got a new CPAP strap that does not hit the area today. He also stated that he would like to hold off until Monday to see if the numbness goes away before he has an xray. He stated that that he would call Monday morning if he still needed the xray. Will hold off on ordering xray until then if that is ok with provider.

## 2017-10-29 NOTE — Telephone Encounter (Signed)
noted 

## 2017-10-30 DIAGNOSIS — M542 Cervicalgia: Secondary | ICD-10-CM | POA: Diagnosis not present

## 2017-10-30 DIAGNOSIS — Z23 Encounter for immunization: Secondary | ICD-10-CM | POA: Diagnosis not present

## 2017-11-02 MED FILL — FUROSEMIDE 40 MG TAB: 40 | 90 days supply | Qty: 90 | Fill #1

## 2017-11-02 MED FILL — glipiZIDE 5 MG TABS: 5 | 90 days supply | Qty: 90 | Fill #1

## 2017-11-04 DIAGNOSIS — I10 Essential (primary) hypertension: Secondary | ICD-10-CM | POA: Diagnosis not present

## 2017-11-04 DIAGNOSIS — M542 Cervicalgia: Secondary | ICD-10-CM | POA: Diagnosis not present

## 2017-11-04 DIAGNOSIS — E119 Type 2 diabetes mellitus without complications: Secondary | ICD-10-CM | POA: Diagnosis not present

## 2017-11-04 DIAGNOSIS — Z79899 Other long term (current) drug therapy: Secondary | ICD-10-CM | POA: Diagnosis not present

## 2017-11-11 DIAGNOSIS — R0989 Other specified symptoms and signs involving the circulatory and respiratory systems: Secondary | ICD-10-CM | POA: Diagnosis not present

## 2017-11-11 DIAGNOSIS — M79604 Pain in right leg: Secondary | ICD-10-CM | POA: Diagnosis not present

## 2017-11-11 DIAGNOSIS — M79605 Pain in left leg: Secondary | ICD-10-CM | POA: Diagnosis not present

## 2017-11-11 MED FILL — JARDIANCE 10 MG TABLET: 10 | 30 days supply | Qty: 30 | Fill #0

## 2017-11-15 DIAGNOSIS — J9611 Chronic respiratory failure with hypoxia: Secondary | ICD-10-CM | POA: Diagnosis not present

## 2017-11-15 DIAGNOSIS — I2699 Other pulmonary embolism without acute cor pulmonale: Secondary | ICD-10-CM | POA: Diagnosis not present

## 2017-11-18 DIAGNOSIS — G4733 Obstructive sleep apnea (adult) (pediatric): Secondary | ICD-10-CM | POA: Diagnosis not present

## 2017-11-25 DIAGNOSIS — G4733 Obstructive sleep apnea (adult) (pediatric): Secondary | ICD-10-CM | POA: Diagnosis not present

## 2017-11-26 DIAGNOSIS — J9611 Chronic respiratory failure with hypoxia: Secondary | ICD-10-CM | POA: Diagnosis not present

## 2017-11-26 DIAGNOSIS — I2699 Other pulmonary embolism without acute cor pulmonale: Secondary | ICD-10-CM | POA: Diagnosis not present

## 2017-11-29 DIAGNOSIS — G4733 Obstructive sleep apnea (adult) (pediatric): Secondary | ICD-10-CM | POA: Diagnosis not present

## 2017-12-09 ENCOUNTER — Encounter: Payer: Self-pay | Admitting: Nurse Practitioner

## 2017-12-09 ENCOUNTER — Ambulatory Visit (INDEPENDENT_AMBULATORY_CARE_PROVIDER_SITE_OTHER): Payer: Medicare HMO

## 2017-12-09 ENCOUNTER — Ambulatory Visit (INDEPENDENT_AMBULATORY_CARE_PROVIDER_SITE_OTHER): Payer: Medicare HMO | Admitting: Nurse Practitioner

## 2017-12-09 VITALS — BP 136/78 | HR 68 | Temp 98.1°F | Ht 69.0 in | Wt 334.0 lb

## 2017-12-09 VITALS — BP 148/78 | HR 68 | Temp 98.1°F | Ht 69.0 in | Wt 334.0 lb

## 2017-12-09 DIAGNOSIS — Z8546 Personal history of malignant neoplasm of prostate: Secondary | ICD-10-CM

## 2017-12-09 DIAGNOSIS — M170 Bilateral primary osteoarthritis of knee: Secondary | ICD-10-CM

## 2017-12-09 DIAGNOSIS — Z599 Problem related to housing and economic circumstances, unspecified: Secondary | ICD-10-CM

## 2017-12-09 DIAGNOSIS — I10 Essential (primary) hypertension: Secondary | ICD-10-CM

## 2017-12-09 DIAGNOSIS — I509 Heart failure, unspecified: Secondary | ICD-10-CM | POA: Diagnosis not present

## 2017-12-09 DIAGNOSIS — G4733 Obstructive sleep apnea (adult) (pediatric): Secondary | ICD-10-CM | POA: Diagnosis not present

## 2017-12-09 DIAGNOSIS — E1122 Type 2 diabetes mellitus with diabetic chronic kidney disease: Secondary | ICD-10-CM

## 2017-12-09 DIAGNOSIS — I480 Paroxysmal atrial fibrillation: Secondary | ICD-10-CM

## 2017-12-09 DIAGNOSIS — Z0000 Encounter for general adult medical examination without abnormal findings: Secondary | ICD-10-CM | POA: Diagnosis not present

## 2017-12-09 DIAGNOSIS — K219 Gastro-esophageal reflux disease without esophagitis: Secondary | ICD-10-CM

## 2017-12-09 DIAGNOSIS — E785 Hyperlipidemia, unspecified: Secondary | ICD-10-CM

## 2017-12-09 DIAGNOSIS — R42 Dizziness and giddiness: Secondary | ICD-10-CM

## 2017-12-09 DIAGNOSIS — Z598 Other problems related to housing and economic circumstances: Secondary | ICD-10-CM | POA: Diagnosis not present

## 2017-12-09 MED ORDER — ZOSTER VAC RECOMB ADJUVANTED 50 MCG/0.5ML IM SUSR: 0.5000 mL | Freq: Once | INTRAMUSCULAR | 1 refills | Status: AC

## 2017-12-09 MED ORDER — PANTOPRAZOLE SODIUM 40 MG PO TBEC: 40.0000 mg | DELAYED_RELEASE_TABLET | Freq: Every day | ORAL | 3 refills | Status: DC

## 2017-12-09 MED ORDER — TRAMADOL HCL 50 MG PO TABS: 50.0000 mg | ORAL_TABLET | Freq: Four times a day (QID) | ORAL | 0 refills | Status: DC | PRN

## 2017-12-09 MED ORDER — AMLODIPINE BESYLATE 10 MG PO TABS: 10.0000 mg | ORAL_TABLET | Freq: Every day | ORAL | 1 refills | Status: DC

## 2017-12-09 MED FILL — AMLODIPINE BESYLATE 10 MG T: 10 | 90 days supply | Qty: 90 | Fill #0

## 2017-12-09 MED FILL — traMADol HCL 50 MG TABS: 50 | 8 days supply | Qty: 30 | Fill #0

## 2017-12-09 MED FILL — JARDIANCE 10 MG TABLET: 10 | 30 days supply | Qty: 30 | Fill #1

## 2017-12-09 MED FILL — PANTOPRAZOLE SOD DR 40 MG T: 40 | 30 days supply | Qty: 30 | Fill #0

## 2017-12-09 NOTE — Progress Notes (Signed)
Provider: [name], NP  Patient Care Team: [name], NP as PCP - General (Geriatric Medicine) Jettie Booze, MD as PCP - Cardiology (Cardiology) Katy Fitch, Darlina Guys, MD as Consulting Physician (Ophthalmology)  Extended Emergency Contact Information Primary Emergency Contact: [facility] Address: [address]          [place], [place] [address] [place]nette Litter of [place] Phone: [phone] Mobile Phone: [phone] Relation: Spouse Secondary Emergency Contact: Knoch,Kabirah A Address: [address], [place] [address] [place] Phone: [phone] Relation: Daughter Allergies  Allergen Reactions  . Lisinopril Cough   Code Status: FULL Goals of Care: Advanced Directive information Advanced Directives 12/09/2017  Does Patient Have a Medical Advance Directive? No  Type of Advance Directive -  Does patient want to make changes to medical advance directive? -  Copy of [place] in Chart? -  Would patient like information on creating a medical advance directive? -     Chief Complaint  Patient presents with  . Medical Management of Chronic Issues    Pt is being seen for a complete physical.     HPI: Patient is a 67 y.o. male seen in today for an annual wellness exam.   Doing intermediate fasting, has not eaten today.  This year has had several hospitalization due to cardiac issues. Nonischemic cardiomyopathy/ chronic systolic heart failure and a fib. Currently under control. Following every 6 months.   Depression screen [facility] 2/9 12/09/2017 12/09/2017 07/20/2017 05/21/2016 09/30/2015  Decreased Interest 0 0 0 0 0  Down, Depressed, Hopeless 0 0 0 1 0  PHQ - 2 Score 0 0 0 1 0  Altered sleeping - - - - -  Tired, decreased energy - - - - -  Change in appetite - - - - -  Feeling bad or failure about yourself  - - - - -  Trouble concentrating - - - - -  Moving slowly or fidgety/restless  - - - - -  Suicidal thoughts - - - - -  PHQ-9 Score - - - - -    Fall Risk  12/09/2017 12/09/2017 07/20/2017 05/06/2017 02/04/2017  Falls in the past year? _0   Number falls in past yr: - - - - -  Injury with Fall? - - - - -  [place] Exam 12/09/2017 05/21/2016  Orientation to time 4 5  Orientation to Place 5 5  Registration 3 3  Attention/ Calculation 5 4  Recall 2 2  Language- name 2 objects 2 2  Language- repeat 1 1  Language- follow 3 step command 3 3  Language- read & follow direction 1 1  Write a sentence 1 1  Copy design 1 1  Total score 28 28     Health Maintenance  Topic Date Due  . HEMOGLOBIN A1C  12/01/2017  . TETANUS/TDAP  01/14/2018 (Originally 07/31/2016)  . FOOT EXAM  12/14/2017  . OPHTHALMOLOGY EXAM  01/18/2018  . COLONOSCOPY  02/16/2018  . INFLUENZA VACCINE  Completed  . Hepatitis C Screening  Completed  . PNA vac Low Risk Adult  Completed   Following with urology due to prostate cancer- radioactive seed implant now Has to [facility]- urology checking PSA which has been stable. Seeing yearly  Diet? Intermittent fasting- only eating between 12-8pm  No longer exercising- wife now  has HD 3 days a week    Dentition: has not been routinely  Pain:no pain.   OSA on CPAP  Coughing for 3-4 weeks- reports tickle in his throat.  Has tired tessalon pearls but did not help  Robitussin did not help.   Has been on jardiance for ~1 month   Past Medical History:  Diagnosis Date  . Acute on chronic diastolic CHF (congestive heart failure) ([place]) 06/01/2017  . Arthritis   . Cancer [facility]) 2010   Prostate  . Chronic kidney disease   . Diabetes mellitus   . Diabetic neuropathy (HCC)    feet  . DVT (deep venous thrombosis) ([place])   . Genetic testing 06/22/2016   Mr. Speas underwent genetic counseling and testing for hereditary cancer syndromes on 05/14/2016. His results were negative for mutations in all 46 genes analyzed by  [employer]'s 46-gene Common Hereditary Cancers Panel. Genes analyzed include: APC, ATM, AXIN2, BARD1, BMPR1A, BRCA1, BRCA2, BRIP1, CDH1, CDKN2A, CHEK2, CTNNA1, DICER1, EPCAM, GREM1, HOXB13, KIT, MEN1, MLH1, MSH2, MSH3, MSH6, MUTYH, NB  . GERD (gastroesophageal reflux disease)   . Hypertension   . PE (pulmonary thromboembolism) ([place])   . Pneumonia   . Sleep apnea    not wearing CPAP    Past Surgical History:  Procedure Laterality Date  . CARDIOVERSION N/A 06/03/2017   Procedure: CARDIOVERSION;  Surgeon: Jerline Pain, MD;  Location: [facility] ENDOSCOPY;  Service: Cardiovascular;  Laterality: N/A;  . COLONOSCOPY    . HERNIA REPAIR    . KNEE SURGERY    . MULTIPLE TOOTH EXTRACTIONS    . PROSTATE SURGERY    . RADIOACTIVE SEED IMPLANT    . RIGHT/LEFT HEART CATH AND CORONARY ANGIOGRAPHY N/A 07/06/2017   Procedure: RIGHT/LEFT HEART CATH AND CORONARY ANGIOGRAPHY;  Surgeon: Jettie Booze, MD;  Location: [place] CV LAB;  Service: Cardiovascular;  Laterality: N/A;  . SHOULDER SURGERY    . TOTAL KNEE ARTHROPLASTY Right 11/19/2016   Procedure: RIGHT TOTAL KNEE ARTHROPLASTY;  Surgeon: Leandrew Koyanagi, MD;  Location: [place];  Service: Orthopedics;  Laterality: Right;  . uretha surgery-2014      Social History   Socioeconomic History  . Marital status: Married    Spouse name: Not on file  . Number of children: Not on file  . Years of education: Not on file  . Highest education level: Not on file  Occupational History  . Not on file  Social Needs  . Financial resource strain: Somewhat hard  . Food insecurity:    Worry: Never true    Inability: Never true  . Transportation needs:    Medical: No    Non-medical: No  Tobacco Use  . Smoking status: Never Smoker  . Smokeless tobacco: Never Used  Substance and Sexual Activity  . Alcohol use: No    Comment: never  . Drug use: No  . Sexual activity: Yes  Lifestyle  . Physical activity:    Days per week: 0 days    Minutes per session: 0 min    . Stress: Only a little  Relationships  . Social connections:    Talks on phone: More than three times a week    Gets together: More than three times a week    Attends religious service: Never    Active member of club or organization: No    Attends meetings of clubs or organizations: Never    Relationship status: Married  Other Topics Concern  . Not on file  Social History Narrative  . Not on file    Family History  Problem Relation Age of Onset  . Breast cancer Mother 9       d.89  . Breast cancer Sister 65       d.30  . Leukemia Brother 18       d.20  . Breast cancer Maternal Aunt 40       d.40s  . Lung cancer Maternal Uncle   . Prostate cancer Paternal Uncle   . Prostate cancer Brother        recurred recently at age 57  . Other Brother 15       spinal tumor  . Cervical cancer Other 22       d.22  . Cancer Sister 42       unspecified type  . Cancer Maternal Uncle 80       unspecified type    Review of Systems:  Review of Systems  Constitutional: Negative for appetite change, chills, fatigue, fever and unexpected weight change.  HENT: Negative for congestion.   Eyes: Negative for visual disturbance.  Respiratory: Positive for cough. Negative for shortness of breath and wheezing.   Cardiovascular: Negative for chest pain, palpitations and leg swelling.  Gastrointestinal: Negative for abdominal pain, blood in stool, constipation and diarrhea.  Genitourinary:       Self-caths  Musculoskeletal: Positive for arthralgias and joint swelling. Negative for gait problem.  Neurological: Positive for numbness. Negative for dizziness and light-headedness.  Hematological: Does not bruise/bleed easily.     Allergies as of 12/09/2017      Reactions   Lisinopril Cough      Medication List        Accurate as of 12/09/17 11:10 AM. Always use your most recent med list.          AMBULATORY NON FORMULARY MEDICATION Full Face CPAP Mask with Tubing Dx: G47.30    amLODipine 10 MG tablet Commonly known as:  NORVASC Take 1 tablet (10 mg total) by mouth daily. For high blood pressure   aspirin EC 81 MG tablet Take 81 mg by mouth daily.   atorvastatin 20 MG tablet Commonly known as:  LIPITOR Take one tablet by mouth once daily for cholesterol   carvedilol 12.5 MG tablet Commonly known as:  COREG Take 1 tablet (12.5 mg total) by mouth 2 (two) times daily with a meal.   furosemide 40 MG tablet Commonly known as:  LASIX Take 1 tablet (40 mg total) by mouth daily.   glipiZIDE 5 MG tablet Commonly known as:  GLUCOTROL Take 1 tablet (5 mg total) by mouth daily before breakfast.   hydrALAZINE 25 MG tablet Commonly known as:  APRESOLINE Take 1 tablet (25 mg total) by mouth 3 (three) times daily.   losartan 100 MG tablet Commonly known as:  COZAAR TAKE 1 TABLET (100 MG TOTAL) BY MOUTH DAILY.   metFORMIN 500 MG tablet Commonly known as:  GLUCOPHAGE TAKE 1 TABLET BY MOUTH 2 TIMES A DAY WITH A MEAL for diabetes   omeprazole 20 MG capsule Commonly known as:  PRILOSEC Take 1 capsule (20 mg total) by mouth as needed.   potassium chloride SA 20 MEQ tablet Commonly known as:  K-DUR,KLOR-CON Take 1 tablet (20 mEq total) by mouth 2 (two) times daily.   rivaroxaban 20 MG Tabs tablet Commonly known as:  XARELTO Take 1 tablet (20 mg total) by mouth daily with supper.   senna-docusate 8.6-50 MG tablet Commonly known as:  Senokot-S Take 1 tablet by mouth at bedtime as needed.   traMADol 50 MG tablet Commonly known as:  ULTRAM TAKE 1 TABLET BY MOUTH EVERY 6 HOURS AS NEEDED. FOR PAIN   TRUE METRIX BLOOD GLUCOSE TEST test strip Generic drug:  glucose blood CHECK BLOOD SUGAR TWICE PER DAY   TRUEPLUS LANCETS 30G Misc USE TO TEST BLOOD SUGAR 2 TIMES A DAY   UNABLE TO FIND CPAP Hose: use daily with CPAP machine. DX G47.30   Zoster Vaccine Adjuvanted injection Commonly known as:  SHINGRIX Inject 0.5 mLs into the muscle once for 1 dose.          Physical Exam: Vitals:   12/09/17 1030  BP: 136/78  Pulse: 68  Temp: 98.1 F (36.7 C)  TempSrc: Oral  SpO2: 95%  Weight: (!) 334 lb (151.5 kg)  Height: _0  (1.753 m)   Body mass index is 49.32 kg/m. Physical Exam  Constitutional: He is oriented to person, place, and time. He appears well-developed and well-nourished.  HENT:  Head: Normocephalic and atraumatic.  Nose: Nose normal.  Mouth/Throat: No oropharyngeal exudate.  Eyes: Pupils are equal, round, and reactive to light. EOM are normal.  Neck: Normal range of motion. Neck supple. No JVD present.  Cardiovascular: Normal rate, regular rhythm and normal heart sounds.  Pulmonary/Chest: Effort normal. He has decreased breath sounds.  Abdominal: Soft. Bowel sounds are normal.  Musculoskeletal: Normal range of motion. He exhibits no edema or tenderness.  Lymphadenopathy:    He has no cervical adenopathy.  Neurological: He is alert and oriented to person, place, and time. No cranial nerve deficit.  Skin: Skin is warm and dry.  Psychiatric: He has a normal mood and affect.    Labs reviewed: Basic Metabolic Panel: Recent Labs    02/04/17 1110  06/01/17 0236  06/03/17 0338 06/29/17 0755 07/15/17 0917  NA 142   < >  --    < > 141 141 145*  K 3.9   < >  --    < > 3.6 3.9 3.8  CL 101   < >  --    < > 107 102 104  CO2 30   < >  --    < > _1 GLUCOSE 255*   < >  --    < > 237* 182* 183*  BUN 16   < >  --    < > _2 CREATININE 1.07   < >  --    < > 1.37* 1.01 0.95  CALCIUM 9.1   < >  --    < > 8.3* 9.0 8.8  MG  --   --  1.7  --  2.1  --   --   TSH 1.08  --  1.471  --   --   --   --    < > = values in this interval not displayed.   Liver Function Tests: Recent Labs    12/10/16 0752 02/04/17 1110 05/04/17 0812 05/31/17 1758  AST 18 8* 10 14*  ALT 17 7* 9 13*  ALKPHOS 71  --   --  67  BILITOT 0.90 0.6 0.6 0.7  PROT 8.2* 7.2 7.0 7.6  ALBUMIN 3.5  --   --  3.6   No results for input(s): LIPASE,  AMYLASE in the last 8760 hours. No results for input(s): AMMONIA in the last 8760 hours. CBC: Recent Labs    02/19/17 1310 03/13/17  1246  05/31/17 1758 06/02/17 0751 06/03/17 0338 06/29/17 0755  WBC 19.3* 8.7   < > 8.0 7.7 7.3 7.2  NEUTROABS 17.6* 6.6  --  5.8  --   --   --   HGB 13.9 11.9*   < > 12.0* 11.0* 11.4* 12.0*  HCT 42.7 37.3*   < > 37.0* 35.7* 36.6* 36.2*  MCV 81.8 82.7   < > 84.3 85.8 86.1 83  PLT 292 310   < > 275 275 286 307   < > = values in this interval not displayed.   Lipid Panel: Recent Labs    05/04/17 0812 06/01/17 0236  CHOL 146 127  HDL 47 44  LDLCALC 80 64  TRIG 104 96  CHOLHDL 3.1 2.9   Lab Results  Component Value Date   HGBA1C 7.1 (H) 06/01/2017    Procedures: No results found.  Assessment/Plan 1. Essential hypertension -blood pressure stable on current regimen.  - amLODipine (NORVASC) 10 MG tablet; Take 1 tablet (10 mg total) by mouth daily. For high blood pressure  Dispense: 90 tablet; Refill: 1 - CBC with Differential/Platelets  2. Primary osteoarthritis of both knees Stable, continues on tramadol as needed  - traMADol (ULTRAM) 50 MG tablet; Take 1 tablet (50 mg total) by mouth every 6 (six) hours as needed. for pain  Dispense: 30 tablet; Refill: 0  3. H/O prostate cancer -followed by urology, s/p radioactive seed implant now requiring I&O cath for urination  4. OSA (obstructive sleep apnea) On CPAP, following with pulmonary   5. Wellness examination AWV done today. Continues to follow up with urologist, cardiologist, orthopedic, ophthalmology, pulmonary routinely.  Pt will be due for colonoscopy in 2020.  Needs to increase exercise, limited due to wife being on HD Encouraged routine dental exams.   6. Gastroesophageal reflux disease without esophagitis -reports cough and mucous with occasional GERD. Does not appear to have increase fluid,  - pantoprazole (PROTONIX) 40 MG tablet; Take 1 tablet (40 mg total) by mouth daily.   Dispense: 30 tablet; Refill: 3  7. Severe obesity (BMI >= 40) (HCC) Encouraged weight loss with increase in activity and diet modifications  8. Paroxysmal atrial fibrillation (HCC) Rate controlled, continues on xarelto for anticoagulation  9. Chronic congestive heart failure, unspecified heart failure type (HCC) Stable, euvolemic at this time. Continues on losartan, carvediolol and lasix   10. Hyperlipidemia LDL goal <70 -will follow up lipids today, continues on statin  11. Vertigo Has improved, still bothered by this occasionally  12. Type 2 diabetes mellitus with chronic kidney disease, without long-term current use of insulin, unspecified CKD stage ([place]) Has been on jardiance 10 mg daily for last month, reports cost of medication is too expensive to keep up with monthly. Continues on metformin and glipizide.  Stressed importance of routine foot exam and ophthalmology visits.   Next appt: 4 months  Admir Candelas K. [place], [facility] [phone]

## 2017-12-09 NOTE — Addendum Note (Signed)
Addended by: [name] E on: 12/09/2017 10:38 AM   Modules accepted: Orders

## 2017-12-09 NOTE — Patient Instructions (Signed)
START Protonix daily to see if this improves cough/ mucous  Stop omeprazole

## 2017-12-09 NOTE — Progress Notes (Addendum)
Subjective:   Thomas [name] is a 67 y.o. male who presents for Medicare Annual/Subsequent preventive examination.  Last AWV-05/21/2016    Objective:    Vitals: BP (!) 148/78 (BP Location: Left Arm, Patient Position: Sitting)   Pulse 68   Temp 98.1 F (36.7 C) (Oral)   Ht _0  (1.753 m)   Wt (!) 334 lb (151.5 kg)   SpO2 93%   BMI 49.32 kg/m   Body mass index is 49.32 kg/m.  Advanced Directives 12/09/2017 09/18/2017 07/20/2017 07/06/2017 06/01/2017 05/31/2017 04/02/2017  Does Patient Have a Medical Advance Directive? _1  No No  Type of Advance Directive - - - - - - -  Does patient want to make changes to medical advance directive? - - - - - - -  Copy of [place] in Chart? - - - - - - -  Would patient like information on creating a medical advance directive? Yes (MAU/Ambulatory/Procedural Areas - Information given) Yes (MAU/Ambulatory/Procedural Areas - Information given) - No - Patient declined No - Patient declined - No - Patient declined    Tobacco Social History   Tobacco Use  Smoking Status Never Smoker  Smokeless Tobacco Never Used     Counseling given: Not Answered   Clinical Intake:  Pre-visit preparation completed: No  Pain : No/denies pain     Diabetes: Yes CBG done?: No Did pt. bring in CBG monitor from home?: No  How often do you need to have someone help you when you read instructions, pamphlets, or other written materials from your doctor or pharmacy?: 1 - Never What is the last grade level you completed in school?: [employer]  Interpreter Needed?: No  Information entered by :: [name], RN  Past Medical History:  Diagnosis Date  . Acute on chronic diastolic CHF (congestive heart failure) ([place]) 06/01/2017  . Arthritis   . Cancer [facility]) 2010   Prostate  . Chronic kidney disease   . Diabetes mellitus   . Diabetic neuropathy (HCC)    feet  . DVT (deep venous thrombosis) ([place])   . Genetic testing 06/22/2016   Mr. Lisbon underwent genetic counseling and testing for hereditary cancer syndromes on 05/14/2016. His results were negative for mutations in all 46 genes analyzed by [facility]'s 46-gene Common Hereditary Cancers Panel. Genes analyzed include: APC, ATM, AXIN2, BARD1, BMPR1A, BRCA1, BRCA2, BRIP1, CDH1, CDKN2A, CHEK2, CTNNA1, DICER1, EPCAM, GREM1, HOXB13, KIT, MEN1, MLH1, MSH2, MSH3, MSH6, MUTYH, NB  . GERD (gastroesophageal reflux disease)   . Hypertension   . PE (pulmonary thromboembolism) ([place])   . Pneumonia   . Sleep apnea    not wearing CPAP   Past Surgical History:  Procedure Laterality Date  . CARDIOVERSION N/A 06/03/2017   Procedure: CARDIOVERSION;  Surgeon: Jerline Pain, MD;  Location: [facility] ENDOSCOPY;  Service: Cardiovascular;  Laterality: N/A;  . COLONOSCOPY    . HERNIA REPAIR    . KNEE SURGERY    . MULTIPLE TOOTH EXTRACTIONS    . PROSTATE SURGERY    . RADIOACTIVE SEED IMPLANT    . RIGHT/LEFT HEART CATH AND CORONARY ANGIOGRAPHY N/A 07/06/2017   Procedure: RIGHT/LEFT HEART CATH AND CORONARY ANGIOGRAPHY;  Surgeon: Jettie Booze, MD;  Location: [place] CV LAB;  Service: Cardiovascular;  Laterality: N/A;  . SHOULDER SURGERY    . TOTAL KNEE ARTHROPLASTY Right 11/19/2016   Procedure: RIGHT TOTAL KNEE ARTHROPLASTY;  Surgeon: Leandrew Koyanagi, MD;  Location: [place];  Service:  Orthopedics;  Laterality: Right;  . uretha surgery-2014     Family History  Problem Relation Age of Onset  . Breast cancer Mother 40       d.89  . Breast cancer Sister 32       d.30  . Leukemia Brother 18       d.20  . Breast cancer Maternal Aunt 40       d.40s  . Lung cancer Maternal Uncle   . Prostate cancer Paternal Uncle   . Prostate cancer Brother        recurred recently at age 48  . Other Brother 15       spinal tumor  . Cervical cancer Other 22       d.22  . Cancer Sister 83       unspecified type  . Cancer Maternal Uncle 80       unspecified type   Social History   Socioeconomic  History  . Marital status: Married    Spouse name: Not on file  . Number of children: Not on file  . Years of education: Not on file  . Highest education level: Not on file  Occupational History  . Not on file  Social Needs  . Financial resource strain: Somewhat hard  . Food insecurity:    Worry: Never true    Inability: Never true  . Transportation needs:    Medical: No    Non-medical: No  Tobacco Use  . Smoking status: Never Smoker  . Smokeless tobacco: Never Used  Substance and Sexual Activity  . Alcohol use: No    Comment: never  . Drug use: No  . Sexual activity: Yes  Lifestyle  . Physical activity:    Days per week: 0 days    Minutes per session: 0 min  . Stress: Only a little  Relationships  . Social connections:    Talks on phone: More than three times a week    Gets together: More than three times a week    Attends religious service: Never    Active member of club or organization: No    Attends meetings of clubs or organizations: Never    Relationship status: Married  Other Topics Concern  . Not on file  Social History Narrative  . Not on file    Outpatient Encounter Medications as of 12/09/2017  Medication Sig  . AMBULATORY NON FORMULARY MEDICATION Full Face CPAP Mask with Tubing Dx: G47.30  . amLODipine (NORVASC) 10 MG tablet Take 1 tablet (10 mg total) by mouth daily. For high blood pressure  . aspirin EC 81 MG tablet Take 81 mg by mouth daily.  Marland Kitchen atorvastatin (LIPITOR) 20 MG tablet Take one tablet by mouth once daily for cholesterol  . carvedilol (COREG) 12.5 MG tablet Take 1 tablet (12.5 mg total) by mouth 2 (two) times daily with a meal.  . furosemide (LASIX) 40 MG tablet Take 1 tablet (40 mg total) by mouth daily.  Marland Kitchen glipiZIDE (GLUCOTROL) 5 MG tablet Take 1 tablet (5 mg total) by mouth daily before breakfast.  . hydrALAZINE (APRESOLINE) 25 MG tablet Take 1 tablet (25 mg total) by mouth 3 (three) times daily.  Marland Kitchen losartan (COZAAR) 100 MG tablet TAKE  1 TABLET (100 MG TOTAL) BY MOUTH DAILY.  . metFORMIN (GLUCOPHAGE) 500 MG tablet TAKE 1 TABLET BY MOUTH 2 TIMES A DAY WITH A MEAL for diabetes  . omeprazole (PRILOSEC) 20 MG capsule Take 1 capsule (20 mg total) by  mouth as needed.  . potassium chloride SA (K-DUR,KLOR-CON) 20 MEQ tablet Take 1 tablet (20 mEq total) by mouth 2 (two) times daily.  . rivaroxaban (XARELTO) 20 MG TABS tablet Take 1 tablet (20 mg total) by mouth daily with supper.  . senna-docusate (SENOKOT S) 8.6-50 MG tablet Take 1 tablet by mouth at bedtime as needed.  . traMADol (ULTRAM) 50 MG tablet TAKE 1 TABLET BY MOUTH EVERY 6 HOURS AS NEEDED. FOR PAIN  . TRUE METRIX BLOOD GLUCOSE TEST test strip CHECK BLOOD SUGAR TWICE PER DAY  . TRUEPLUS LANCETS 30G MISC USE TO TEST BLOOD SUGAR 2 TIMES A DAY  . UNABLE TO FIND CPAP Hose: use daily with CPAP machine. DX G47.30  . Zoster Vaccine Adjuvanted [facility]) injection Inject 0.5 mLs into the muscle once.   No facility-administered encounter medications on file as of 12/09/2017.     Activities of Daily Living In your present state of health, do you have any difficulty performing the following activities: 12/09/2017 06/01/2017  Hearing? N N  Vision? N N  Difficulty concentrating or making decisions? N N  Walking or climbing stairs? Y Y  Dressing or bathing? N N  Doing errands, shopping? N N  Preparing Food and eating ? N -  Using the Toilet? N -  In the past six months, have you accidently leaked urine? Y -  Comment in and out catheter -  Do you have problems with loss of bowel control? N -  Managing your Medications? N -  Managing your Finances? N -  Housekeeping or managing your Housekeeping? N -  Some recent data might be hidden    Patient Care Team: [name], NP as PCP - General ([place]) Jettie Booze, MD as PCP - Cardiology (Cardiology) Debbra Riding, MD as Consulting Physician (Ophthalmology)   Assessment:   This is a routine  wellness examination for [place].  Exercise Activities and Dietary recommendations Current Exercise Habits: The patient does not participate in regular exercise at present, Exercise limited by: None identified  Goals    . Exercise 3x per week (30 min per time)     Starting 05/21/16 I will start going to [facility] and workout 3 days a week.       Fall Risk Fall Risk  12/09/2017 07/20/2017 05/06/2017 02/04/2017 12/14/2016  Falls in the past year? No No No No Yes  Number falls in past yr: - - - - 2 or more  Injury with Fall? - - - - Yes  Comment - - - - Knee numbness-left   Is the patient's home free of loose throw rugs in walkways, pet beds, electrical cords, etc?   yes      Grab bars in the bathroom? yes      Handrails on the stairs?   yes      Adequate lighting?   yes  Depression Screen PHQ 2/9 Scores 12/09/2017 07/20/2017 05/21/2016 09/30/2015  PHQ - 2 Score 0 0 1 0  PHQ- 9 Score - - - -    Cognitive Function MMSE - Mini Mental State Exam 12/09/2017 05/21/2016  Orientation to time 4 5  Orientation to Place 5 5  Registration 3 3  Attention/ Calculation 5 4  Recall 2 2  Language- name 2 objects 2 2  Language- repeat 1 1  Language- follow 3 step command 3 3  Language- read & follow direction 1 1  Write a sentence 1 1  Copy design 1 1  Total score 28 28        Immunization History  Administered Date(s) Administered  . Influenza, High Dose Seasonal PF 12/14/2016  . Influenza,inj,Quad PF,6+ Mos 11/22/2013, 12/05/2014, 10/24/2015  . Pneumococcal Conjugate-13 05/23/2014  . Pneumococcal Polysaccharide-23 12/13/2012, 05/21/2016    Qualifies for Shingles Vaccine? Yes, educated and ordered to pharmacy  Screening Tests Health Maintenance  Topic Date Due  . INFLUENZA VACCINE  09/16/2017  . HEMOGLOBIN A1C  12/01/2017  . TETANUS/TDAP  01/14/2018 (Originally 07/31/2016)  . FOOT EXAM  12/14/2017  . OPHTHALMOLOGY EXAM  01/18/2018  . COLONOSCOPY  02/16/2018  . Hepatitis C Screening   Completed  . PNA vac Low Risk Adult  Completed   Cancer Screenings: Lung: Low Dose CT Chest recommended if Age 47-80 years, 30 pack-year currently smoking OR have quit w/in 15years. Patient does not qualify. Colorectal: up to date  Additional Screenings:  Hepatitis C Screening:declined  TDAP due: declined    Plan:    I have personally reviewed and addressed the Medicare Annual Wellness questionnaire and have noted the following in the patient's chart:  A. Medical and social history B. Use of alcohol, tobacco or illicit drugs  C. Current medications and supplements D. Functional ability and status E.  Nutritional status F.  Physical activity G. Advance directives H. List of other physicians I.  Hospitalizations, surgeries, and ER visits in previous 12 months J.  [place] to include hearing, vision, cognitive, depression L. Referrals and appointments - C3 referral for jardiance   In addition, I have reviewed and discussed with patient certain preventive protocols, quality metrics, and best practice recommendations. A written personalized care plan for preventive services as well as general preventive health recommendations were provided to patient.  See attached scanned questionnaire for additional information.   Signed,   [name], RN Nurse Health Advisor  Patient concerns: None

## 2017-12-09 NOTE — Patient Instructions (Signed)
Thomas [name] , Thank you for taking time to come for your Medicare Wellness Visit. I appreciate your ongoing commitment to your health goals. Please review the following plan we discussed and let me know if I can assist you in the future.   Screening recommendations/referrals: Colonoscopy up to date, due 08/07/2019 Recommended yearly ophthalmology/optometry visit for glaucoma screening and checkup Recommended yearly dental visit for hygiene and checkup  Vaccinations: Influenza vaccine up to date Pneumococcal vaccine up to date, completed Tdap vaccine up to date, due 12/15/2026 Shingles vaccine ordered, sent to pharmacy    Advanced directives: Advance directive discussed with you today. I have provided a copy for you to complete at home and have notarized. Once this is complete please bring a copy in to our office so we can scan it into your chart.  Conditions/risks identified: none  Next appointment: Thomas Dense, RN 12/15/2018 @ 10am  Preventive Care 17 Years and Older, Male Preventive care refers to lifestyle choices and visits with your health care provider that can promote health and wellness. What does preventive care include?  A yearly physical exam. This is also called an annual well check.  Dental exams once or twice a year.  Routine eye exams. Ask your health care provider how often you should have your eyes checked.  Personal lifestyle choices, including:  Daily care of your teeth and gums.  Regular physical activity.  Eating a healthy diet.  Avoiding tobacco and drug use.  Limiting alcohol use.  Practicing safe sex.  Taking low doses of aspirin every day.  Taking vitamin and mineral supplements as recommended by your health care provider. What happens during an annual well check? The services and screenings done by your health care provider during your annual well check will depend on your age, overall health, lifestyle risk factors, and family history of  disease. Counseling  Your health care provider may ask you questions about your:  Alcohol use.  Tobacco use.  Drug use.  Emotional well-being.  Home and relationship well-being.  Sexual activity.  Eating habits.  History of falls.  Memory and ability to understand (cognition).  Work and work Statistician. Screening  You may have the following tests or measurements:  Height, weight, and BMI.  Blood pressure.  Lipid and cholesterol levels. These may be checked every 5 years, or more frequently if you are over 41 years old.  Skin check.  Lung cancer screening. You may have this screening every year starting at age 70 if you have a 30-pack-year history of smoking and currently smoke or have quit within the past 15 years.  Fecal occult blood test (FOBT) of the stool. You may have this test every year starting at age 12.  Flexible sigmoidoscopy or colonoscopy. You may have a sigmoidoscopy every 5 years or a colonoscopy every 10 years starting at age 27.  Prostate cancer screening. Recommendations will vary depending on your family history and other risks.  Hepatitis C blood test.  Hepatitis B blood test.  Sexually transmitted disease (STD) testing.  Diabetes screening. This is done by checking your blood sugar (glucose) after you have not eaten for a while (fasting). You may have this done every 1-3 years.  Abdominal aortic aneurysm (AAA) screening. You may need this if you are a current or former smoker.  Osteoporosis. You may be screened starting at age 31 if you are at high risk. Talk with your health care provider about your test results, treatment options, and if necessary, the  need for more tests. Vaccines  Your health care provider may recommend certain vaccines, such as:  Influenza vaccine. This is recommended every year.  Tetanus, diphtheria, and acellular pertussis (Tdap, Td) vaccine. You may need a Td booster every 10 years.  Zoster vaccine. You may  need this after age 62.  Pneumococcal 13-valent conjugate (PCV13) vaccine. One dose is recommended after age 23.  Pneumococcal polysaccharide (PPSV23) vaccine. One dose is recommended after age 51. Talk to your health care provider about which screenings and vaccines you need and how often you need them. This information is not intended to replace advice given to you by your health care provider. Make sure you discuss any questions you have with your health care provider. Document Released: 03/01/2015 Document Revised: 10/23/2015 Document Reviewed: 12/04/2014 Elsevier Interactive Patient Education  2017 [place] Prevention in the Home Falls can cause injuries. They can happen to people of all ages. There are many things you can do to make your home safe and to help prevent falls. What can I do on the outside of my home?  Regularly fix the edges of walkways and driveways and fix any cracks.  Remove anything that might make you trip as you walk through a door, such as a raised step or threshold.  Trim any bushes or trees on the path to your home.  Use bright outdoor lighting.  Clear any walking paths of anything that might make someone trip, such as rocks or tools.  Regularly check to see if handrails are loose or broken. Make sure that both sides of any steps have handrails.  Any raised decks and porches should have guardrails on the edges.  Have any leaves, snow, or ice cleared regularly.  Use sand or salt on walking paths during winter.  Clean up any spills in your garage right away. This includes oil or grease spills. What can I do in the bathroom?  Use night lights.  Install grab bars by the toilet and in the tub and shower. Do not use towel bars as grab bars.  Use non-skid mats or decals in the tub or shower.  If you need to sit down in the shower, use a plastic, non-slip stool.  Keep the floor dry. Clean up any water that spills on the floor as soon as it  happens.  Remove soap buildup in the tub or shower regularly.  Attach bath mats securely with double-sided non-slip rug tape.  Do not have throw rugs and other things on the floor that can make you trip. What can I do in the bedroom?  Use night lights.  Make sure that you have a light by your bed that is easy to reach.  Do not use any sheets or blankets that are too big for your bed. They should not hang down onto the floor.  Have a firm chair that has side arms. You can use this for support while you get dressed.  Do not have throw rugs and other things on the floor that can make you trip. What can I do in the kitchen?  Clean up any spills right away.  Avoid walking on wet floors.  Keep items that you use a lot in easy-to-reach places.  If you need to reach something above you, use a strong step stool that has a grab bar.  Keep electrical cords out of the way.  Do not use floor polish or wax that makes floors slippery. If you must use wax,  use non-skid floor wax.  Do not have throw rugs and other things on the floor that can make you trip. What can I do with my stairs?  Do not leave any items on the stairs.  Make sure that there are handrails on both sides of the stairs and use them. Fix handrails that are broken or loose. Make sure that handrails are as long as the stairways.  Check any carpeting to make sure that it is firmly attached to the stairs. Fix any carpet that is loose or worn.  Avoid having throw rugs at the top or bottom of the stairs. If you do have throw rugs, attach them to the floor with carpet tape.  Make sure that you have a light switch at the top of the stairs and the bottom of the stairs. If you do not have them, ask someone to add them for you. What else can I do to help prevent falls?  Wear shoes that:  Do not have high heels.  Have rubber bottoms.  Are comfortable and fit you well.  Are closed at the toe. Do not wear sandals.  If you  use a stepladder:  Make sure that it is fully opened. Do not climb a closed stepladder.  Make sure that both sides of the stepladder are locked into place.  Ask someone to hold it for you, if possible.  Clearly mark and make sure that you can see:  Any grab bars or handrails.  First and last steps.  Where the edge of each step is.  Use tools that help you move around (mobility aids) if they are needed. These include:  Canes.  Walkers.  Scooters.  Crutches.  Turn on the lights when you go into a dark area. Replace any light bulbs as soon as they burn out.  Set up your furniture so you have a clear path. Avoid moving your furniture around.  If any of your floors are uneven, fix them.  If there are any pets around you, be aware of where they are.  Review your medicines with your doctor. Some medicines can make you feel dizzy. This can increase your chance of falling. Ask your doctor what other things that you can do to help prevent falls. This information is not intended to replace advice given to you by your health care provider. Make sure you discuss any questions you have with your health care provider. Document Released: 11/29/2008 Document Revised: 07/11/2015 Document Reviewed: 03/09/2014 Elsevier Interactive Patient Education  2017 [employer].

## 2017-12-10 ENCOUNTER — Telehealth: Payer: Self-pay

## 2017-12-10 DIAGNOSIS — E1122 Type 2 diabetes mellitus with diabetic chronic kidney disease: Secondary | ICD-10-CM

## 2017-12-10 LAB — CBC WITH DIFFERENTIAL/PLATELET
Basophils Absolute: 49 cells/uL (ref 0–200)
Basophils Relative: 0.6 %
Eosinophils Absolute: 33 cells/uL (ref 15–500)
Eosinophils Relative: 0.4 %
HCT: 40.1 % (ref 38.5–50.0)
Hemoglobin: 12.8 g/dL — ABNORMAL LOW (ref 13.2–17.1)
Lymphs Abs: 1353 cells/uL (ref 850–3900)
MCH: 25.8 pg — ABNORMAL LOW (ref 27.0–33.0)
MCHC: 31.9 g/dL — ABNORMAL LOW (ref 32.0–36.0)
MCV: 80.8 fL (ref 80.0–100.0)
MPV: 10.2 fL (ref 7.5–12.5)
Monocytes Relative: 6.6 %
Neutro Abs: 6224 cells/uL (ref 1500–7800)
Neutrophils Relative %: 75.9 %
Platelets: 342 10*3/uL (ref 140–400)
RBC: 4.96 10*6/uL (ref 4.20–5.80)
RDW: 14.4 % (ref 11.0–15.0)
Total Lymphocyte: 16.5 %
WBC mixed population: 541 cells/uL (ref 200–950)
WBC: 8.2 10*3/uL (ref 3.8–10.8)

## 2017-12-10 LAB — COMPLETE METABOLIC PANEL WITH GFR
AG Ratio: 1.3 (calc) (ref 1.0–2.5)
ALT: 11 U/L (ref 9–46)
AST: 15 U/L (ref 10–35)
Albumin: 4.2 g/dL (ref 3.6–5.1)
Alkaline phosphatase (APISO): 73 U/L (ref 40–115)
BUN: 11 mg/dL (ref 7–25)
CO2: 27 mmol/L (ref 20–32)
Calcium: 8.9 mg/dL (ref 8.6–10.3)
Chloride: 107 mmol/L (ref 98–110)
Creat: 1.06 mg/dL (ref 0.70–1.25)
GFR, Est African American: 84 mL/min/{1.73_m2} (ref >=60)
GFR, Est Non African American: 72 mL/min/{1.73_m2} (ref >=60)
Globulin: 3.3 g/dL (calc) (ref 1.9–3.7)
Glucose, Bld: 203 mg/dL — ABNORMAL HIGH (ref 65–99)
Potassium: 4 mmol/L (ref 3.5–5.3)
Sodium: 145 mmol/L (ref 135–146)
Total Bilirubin: 0.6 mg/dL (ref 0.2–1.2)
Total Protein: 7.5 g/dL (ref 6.1–8.1)

## 2017-12-10 LAB — HEMOGLOBIN A1C
Hgb A1c MFr Bld: 7.4 % of total Hgb — ABNORMAL HIGH (ref <5.7)
Mean Plasma Glucose: 166 (calc)
eAG (mmol/L): 9.2 (calc)

## 2017-12-10 LAB — LIPID PANEL
Cholesterol: 143 mg/dL (ref <200)
HDL: 46 mg/dL (ref >40)
LDL Cholesterol (Calc): 82 mg/dL (calc)
Non-HDL Cholesterol (Calc): 97 mg/dL (calc) (ref <130)
Total CHOL/HDL Ratio: 3.1 (calc) (ref <5.0)
Triglycerides: 74 mg/dL (ref <150)

## 2017-12-10 MED ORDER — METFORMIN HCL 1000 MG PO TABS: 1000.0000 mg | ORAL_TABLET | Freq: Two times a day (BID) | ORAL | 0 refills | Status: DC

## 2017-12-10 NOTE — Telephone Encounter (Signed)
Discussed labs with patient. Patient verbalized understanding and had no further question. Confirmed pharmacy. Rx sent .

## 2017-12-10 NOTE — Telephone Encounter (Signed)
-----   Message from [name], NP sent at 12/10/2017 10:01 AM EDT ----- a1c is elevated, to continue dietary modifications with medication, looking at medication list if he can not afford to take jardiance monthly would recommend increasing metformin to 1000 mg by mouth twice daily (unless he can not tolerate this due to side effects) . Otherwise labs are stable

## 2017-12-14 ENCOUNTER — Encounter: Payer: Self-pay | Admitting: Pulmonary Disease

## 2017-12-14 ENCOUNTER — Telehealth: Payer: Self-pay | Admitting: Pulmonary Disease

## 2017-12-14 ENCOUNTER — Ambulatory Visit: Payer: Medicare HMO | Admitting: Pulmonary Disease

## 2017-12-14 VITALS — BP 140/82 | HR 82 | Ht 69.0 in | Wt 340.0 lb

## 2017-12-14 DIAGNOSIS — G4733 Obstructive sleep apnea (adult) (pediatric): Secondary | ICD-10-CM

## 2017-12-14 DIAGNOSIS — I272 Pulmonary hypertension, unspecified: Secondary | ICD-10-CM | POA: Diagnosis not present

## 2017-12-14 DIAGNOSIS — I2724 Chronic thromboembolic pulmonary hypertension: Secondary | ICD-10-CM

## 2017-12-14 DIAGNOSIS — G4730 Sleep apnea, unspecified: Secondary | ICD-10-CM | POA: Diagnosis not present

## 2017-12-14 DIAGNOSIS — J9611 Chronic respiratory failure with hypoxia: Secondary | ICD-10-CM

## 2017-12-14 DIAGNOSIS — E669 Obesity, unspecified: Secondary | ICD-10-CM

## 2017-12-14 NOTE — Progress Notes (Signed)
[facility]  Chief Complaint  Patient presents with  . Follow-up    Patient is doing well over all with cpap machine.     Constitutional:  BP 140/82 (BP Location: Left Arm, Cuff Size: Normal)   Pulse 82   Ht 5\' 9"  (1.753 m)   Wt (!) 340 lb (154.2 kg)   SpO2 91%   BMI 50.21 kg/m   Past Medical History:  Non ischemic CM with combined CHF, Prostate cancer, CKD, DM, DM neuropathy, DVT, GERD, HTN, PNA  Brief Summary:  Thomas [name] is a 67 y.o. male with obstructive sleep apnea, systolic and CTEPH.  Since his last visit with me he had home sleep study.  Showed severe OSA with oxygen desaturation.  He then had in lab titration study.  Did well with Bipap and didn't need oxygen with Bipap.  He still needs oxygen with exertion during the day.  He was to get a POC, but was told by his DME that he had to fail with oxygen tanks before he can get a POC.  As a result he is limited in his mobility because it is very cumbersome to carry around the tanks.  He has a pulse oximeter at home, and his oxygen level will drop below 88% if he walks too much.  He is doing better with current Bipap mask.  Gets dry mouth in morning.  Not having sinus congestion, sore throat, or aerophagia.  Had leg swelling, but better with adjustment in dose of lasix.  Not having cough, wheeze, sputum, or chest pain.   Physical Exam:   Appearance - well kempt   ENMT - clear nasal mucosa, midline nasal  septum, no oral exudates, no LAN, trachea midline, MP 4, enlarged tongue  Respiratory - normal chest wall, normal respiratory effort, no accessory muscle use, no wheeze/rales  CV - s1s2 regular rate and rhythm, no murmurs, ankle edema, radial pulses symmetric  GI - soft, non tender, no masses  Lymph - no adenopathy noted in neck and axillary areas  MSK - walks with a cane  Ext - no cyanosis, clubbing, or joint inflammation noted  Skin - no rashes, lesions, or  ulcers  Neuro - normal strength, oriented x 3  Psych - normal mood and affect    Assessment/Plan:   Obstructive sleep apnea. - he is compliant with Bipap and reports benefit - continue Bipap 19/13 cm H2O  Chronic respiratory failure with hypoxia. - 2 liters oxygen with exertion - discussed the role of supplemental oxygen and how to best use this - will arrange for POC  Pulmonary hypertension. - WHO group 2, 3, and 5 - he has f/u with cardiology >> would be helpful to get a f/u Echo to determine if PA pressures have improved with better control of his sleep apnea  CTEPH. - xarelto per primary care  Obesity. - discussed options to assist with weight loss    Patient Instructions  Will have [facility] arrange for portable oxygen concentrator  Check your oxygen level at home when you are doing activities - goal is to keep level above 90%  Follow up in 6 months  Time spent 27 minutes  Chesley Mires, MD [place] Pager: [phone] 12/14/2017, 9:24 AM  Flow Sheet     Pulmonary tests:  CT angio chest 11/23/15 >> PE Rt side CT angio chest 03/03/16 >> chronic PE on Rt CT angio chest 04/02/17 >> atherosclerosis Spirometry 10/19/17 >> FEV1  2.5 (81%), FEV1% 91  Sleep tests:  HST 09/08/17 >> AHI 77.9, SaO2 low 70% Bipap titration 09/18/17 >> Bipap 19/13 cm H2O >> AHI 8.5, didn't need supplemental oxygen. Bipap 11/14/17 to 12/13/17 >> used on 30 of 30 nights with average 8 hrs 25 min.  Average AHI 2.3 with Bipap 19/13 cm H2O  Cardiac tests:  Echo 06/02/17 >> EF 45 to 50%, PAS 35 mmHg RHC/LHC 07/06/17 >> non obstructive CAD, mod/severe pulmonary HTN  Medications:   Allergies as of 12/14/2017      Reactions   Lisinopril Cough      Medication List        Accurate as of 12/14/17  9:24 AM. Always use your most recent med list.          AMBULATORY NON FORMULARY MEDICATION Full Face CPAP Mask with Tubing Dx: G47.30   amLODipine 10 MG  tablet Commonly known as:  NORVASC Take 1 tablet (10 mg total) by mouth daily. For high blood pressure   aspirin EC 81 MG tablet Take 81 mg by mouth daily.   atorvastatin 20 MG tablet Commonly known as:  LIPITOR Take one tablet by mouth once daily for cholesterol   carvedilol 12.5 MG tablet Commonly known as:  COREG Take 1 tablet (12.5 mg total) by mouth 2 (two) times daily with a meal.   furosemide 40 MG tablet Commonly known as:  LASIX Take 1 tablet (40 mg total) by mouth daily.   glipiZIDE 5 MG tablet Commonly known as:  GLUCOTROL Take 1 tablet (5 mg total) by mouth daily before breakfast.   hydrALAZINE 25 MG tablet Commonly known as:  APRESOLINE Take 1 tablet (25 mg total) by mouth 3 (three) times daily.   losartan 100 MG tablet Commonly known as:  COZAAR TAKE 1 TABLET (100 MG TOTAL) BY MOUTH DAILY.   metFORMIN 1000 MG tablet Commonly known as:  GLUCOPHAGE Take 1 tablet (1,000 mg total) by mouth 2 (two) times daily with a meal.   pantoprazole 40 MG tablet Commonly known as:  PROTONIX Take 1 tablet (40 mg total) by mouth daily.   potassium chloride SA 20 MEQ tablet Commonly known as:  K-DUR,KLOR-CON Take 1 tablet (20 mEq total) by mouth 2 (two) times daily.   rivaroxaban 20 MG Tabs tablet Commonly known as:  XARELTO Take 1 tablet (20 mg total) by mouth daily with supper.   senna-docusate 8.6-50 MG tablet Commonly known as:  Senokot-S Take 1 tablet by mouth at bedtime as needed.   traMADol 50 MG tablet Commonly known as:  ULTRAM Take 1 tablet (50 mg total) by mouth every 6 (six) hours as needed. for pain   TRUE METRIX BLOOD GLUCOSE TEST test strip Generic drug:  glucose blood CHECK BLOOD SUGAR TWICE PER DAY   TRUEPLUS LANCETS 30G Misc USE TO TEST BLOOD SUGAR 2 TIMES A DAY   UNABLE TO FIND CPAP Hose: use daily with CPAP machine. DX G47.30       Past Surgical History:  He  has a past surgical history that includes Knee surgery; Shoulder surgery;  Hernia repair; Prostate surgery; uretha surgery-2014; Radioactive seed implant; Colonoscopy; Multiple tooth extractions; Total knee arthroplasty (Right, 11/19/2016); Cardioversion (N/A, 06/03/2017); and RIGHT/LEFT HEART CATH AND CORONARY ANGIOGRAPHY (N/A, 07/06/2017).  Family History:  His family history includes Breast cancer (age of onset: 74) in his sister; Breast cancer (age of onset: 41) in his maternal aunt; Breast cancer (age of onset: 30) in his mother; Cancer (age of onset:  22) in his sister; Cancer (age of onset: 71) in his maternal uncle; Cervical cancer (age of onset: 60) in his other; Leukemia (age of onset: 53) in his brother; Lung cancer in his maternal uncle; Other (age of onset: 40) in his brother; Prostate cancer in his brother and paternal uncle.  Social History:  He  reports that he has never smoked. He has never used smokeless tobacco. He reports that he does not drink alcohol or use drugs.

## 2017-12-14 NOTE — Patient Instructions (Signed)
Will have [facility] arrange for portable oxygen concentrator  Check your oxygen level at home when you are doing activities - goal is to keep level above 90%  Follow up in 6 months

## 2017-12-14 NOTE — Telephone Encounter (Signed)
Spoke with pt, advised him of message from [facility]. He states he would call them and figure out what is going on with the billing.  I called [employer] to let them know that he will be giving them a call because he seemed confused about the billing issue. Will hold in LM to f/u to see if this was resolved.

## 2017-12-15 DIAGNOSIS — J9611 Chronic respiratory failure with hypoxia: Secondary | ICD-10-CM | POA: Diagnosis not present

## 2017-12-15 DIAGNOSIS — I2699 Other pulmonary embolism without acute cor pulmonale: Secondary | ICD-10-CM | POA: Diagnosis not present

## 2017-12-15 NOTE — Telephone Encounter (Signed)
Called and spoke with patient, he is handling matters with [employer] with billing issues. Nothing further needed.

## 2017-12-22 ENCOUNTER — Telehealth: Payer: Self-pay | Admitting: Nurse Practitioner

## 2017-12-22 NOTE — Telephone Encounter (Signed)
I called the patient about financial assistance with Jardiance.  I explained the patient assistance program and offered to send him an application.  He stated that the application was already given to him when he was there, and that he will return it as soon as he can. VDM (DD)

## 2017-12-26 DIAGNOSIS — G4733 Obstructive sleep apnea (adult) (pediatric): Secondary | ICD-10-CM | POA: Diagnosis not present

## 2017-12-27 DIAGNOSIS — J9611 Chronic respiratory failure with hypoxia: Secondary | ICD-10-CM | POA: Diagnosis not present

## 2017-12-27 DIAGNOSIS — I2699 Other pulmonary embolism without acute cor pulmonale: Secondary | ICD-10-CM | POA: Diagnosis not present

## 2017-12-31 ENCOUNTER — Other Ambulatory Visit: Payer: Self-pay | Admitting: Nurse Practitioner

## 2017-12-31 DIAGNOSIS — E1122 Type 2 diabetes mellitus with diabetic chronic kidney disease: Secondary | ICD-10-CM

## 2017-12-31 MED FILL — CARVEDILOL 12.5 MG TABLET: 12.5 | 90 days supply | Qty: 180 | Fill #1

## 2018-01-03 DIAGNOSIS — G4733 Obstructive sleep apnea (adult) (pediatric): Secondary | ICD-10-CM | POA: Diagnosis not present

## 2018-01-11 ENCOUNTER — Other Ambulatory Visit: Payer: Self-pay | Admitting: Nurse Practitioner

## 2018-01-11 DIAGNOSIS — I10 Essential (primary) hypertension: Secondary | ICD-10-CM

## 2018-01-11 DIAGNOSIS — M170 Bilateral primary osteoarthritis of knee: Secondary | ICD-10-CM

## 2018-01-11 MED FILL — LOSARTAN POTASSIUM 100 MG T: 100 | 90 days supply | Qty: 90 | Fill #0

## 2018-01-11 MED FILL — POTASSIUM CHLORIDE CRYS ER: 20 | 30 days supply | Qty: 60 | Fill #1

## 2018-01-11 MED FILL — PANTOPRAZOLE SOD DR 40 MG T: 40 | 30 days supply | Qty: 30 | Fill #1

## 2018-01-11 MED FILL — traMADol HCL 50 MG TABS: 50 | 7 days supply | Qty: 30 | Fill #0

## 2018-01-11 NOTE — Telephone Encounter (Signed)
A medication refill was received from pharmacy for tramadol 50 mg and losartan 100 mg. Rx for losartan was sent to the pharmacy electronically and tramadol was called in to the pharmacy.

## 2018-01-15 DIAGNOSIS — I2699 Other pulmonary embolism without acute cor pulmonale: Secondary | ICD-10-CM | POA: Diagnosis not present

## 2018-01-15 DIAGNOSIS — J9611 Chronic respiratory failure with hypoxia: Secondary | ICD-10-CM | POA: Diagnosis not present

## 2018-01-25 DIAGNOSIS — G4733 Obstructive sleep apnea (adult) (pediatric): Secondary | ICD-10-CM | POA: Diagnosis not present

## 2018-01-26 DIAGNOSIS — J9611 Chronic respiratory failure with hypoxia: Secondary | ICD-10-CM | POA: Diagnosis not present

## 2018-01-26 DIAGNOSIS — I2699 Other pulmonary embolism without acute cor pulmonale: Secondary | ICD-10-CM | POA: Diagnosis not present

## 2018-01-31 ENCOUNTER — Other Ambulatory Visit: Payer: Self-pay | Admitting: Nurse Practitioner

## 2018-01-31 ENCOUNTER — Telehealth: Payer: Self-pay

## 2018-01-31 DIAGNOSIS — M170 Bilateral primary osteoarthritis of knee: Secondary | ICD-10-CM

## 2018-01-31 MED ORDER — METFORMIN HCL 500 MG PO TABS: 500.0000 mg | ORAL_TABLET | Freq: Two times a day (BID) | ORAL | 1 refills | Status: DC

## 2018-01-31 MED FILL — JARDIANCE 10 MG TABLET: 10 | 30 days supply | Qty: 30 | Fill #2

## 2018-01-31 MED FILL — traMADol HCL 50 MG TABS: 50 | 8 days supply | Qty: 30 | Fill #0

## 2018-01-31 MED FILL — hydrALAZINE HCL 25 MG TABS: 25 | 90 days supply | Qty: 270 | Fill #2

## 2018-01-31 NOTE — Telephone Encounter (Signed)
Last filled 01/11/18 Database checked and verified

## 2018-01-31 NOTE — Telephone Encounter (Signed)
Refer to labs dated 12/09/17.  Patient called today indicating that he is taking Jardiance 10 mg daily and metformin 500 mg twice daily vs what current medication list states: metformin 1000 twice daily.  Per verbal conversation with Janett Billow ok to change medication list to what patient is currently taking and refill metformin  Patient aware rx sent, pharmacy confirmed

## 2018-02-01 MED FILL — metFORMIN HCL 500 MG TABS: 500 | 90 days supply | Qty: 180 | Fill #0

## 2018-02-02 DIAGNOSIS — G4733 Obstructive sleep apnea (adult) (pediatric): Secondary | ICD-10-CM | POA: Diagnosis not present

## 2018-02-14 DIAGNOSIS — I2699 Other pulmonary embolism without acute cor pulmonale: Secondary | ICD-10-CM | POA: Diagnosis not present

## 2018-02-14 DIAGNOSIS — J9611 Chronic respiratory failure with hypoxia: Secondary | ICD-10-CM | POA: Diagnosis not present

## 2018-02-14 MED FILL — glipiZIDE 5 MG TABS: 5 | 90 days supply | Qty: 90 | Fill #0

## 2018-02-25 DIAGNOSIS — G4733 Obstructive sleep apnea (adult) (pediatric): Secondary | ICD-10-CM | POA: Diagnosis not present

## 2018-02-26 DIAGNOSIS — J9611 Chronic respiratory failure with hypoxia: Secondary | ICD-10-CM | POA: Diagnosis not present

## 2018-02-26 DIAGNOSIS — I2699 Other pulmonary embolism without acute cor pulmonale: Secondary | ICD-10-CM | POA: Diagnosis not present

## 2018-03-04 DIAGNOSIS — G4733 Obstructive sleep apnea (adult) (pediatric): Secondary | ICD-10-CM | POA: Diagnosis not present

## 2018-03-08 ENCOUNTER — Other Ambulatory Visit: Payer: Self-pay | Admitting: Nurse Practitioner

## 2018-03-08 DIAGNOSIS — M170 Bilateral primary osteoarthritis of knee: Secondary | ICD-10-CM

## 2018-03-08 MED FILL — PANTOPRAZOLE SOD DR 40 MG T: 40 | 30 days supply | Qty: 30 | Fill #2

## 2018-03-08 MED FILL — traMADol HCL 50 MG TABS: 50 | 7 days supply | Qty: 30 | Fill #0

## 2018-03-08 NOTE — Telephone Encounter (Signed)
[facility] Database verified and compliance confirmed   Last filled 01/31/2018

## 2018-03-14 ENCOUNTER — Ambulatory Visit (INDEPENDENT_AMBULATORY_CARE_PROVIDER_SITE_OTHER): Payer: Medicare HMO | Admitting: Family

## 2018-03-14 ENCOUNTER — Encounter: Payer: Self-pay | Admitting: Family

## 2018-03-14 VITALS — BP 162/74 | HR 67 | Temp 97.7°F | Ht 69.0 in | Wt 332.8 lb

## 2018-03-14 DIAGNOSIS — R399 Unspecified symptoms and signs involving the genitourinary system: Secondary | ICD-10-CM

## 2018-03-14 NOTE — Progress Notes (Addendum)
Provider: Dinah Ngetich FNP-C  [name], NP  Patient Care Team: [name], NP as PCP - General (Geriatric Medicine) Jettie Booze, MD as PCP - Cardiology (Cardiology) Katy Fitch, Darlina Guys, MD as Consulting Physician (Ophthalmology)  Extended Emergency Contact Information Primary Emergency Contact: [facility] Address: [address]          [place], [place] [address] [place] of [place] Phone: [phone] Mobile Phone: [phone] Relation: Spouse Secondary Emergency Contact: Qazi,Kabirah A Address: [address], [place] [address] [place] Phone: [phone] Relation: Daughter  Goals of care: Advanced Directive information Advanced Directives 03/14/2018  Does Patient Have a Medical Advance Directive? No  Type of Advance Directive -  Does patient want to make changes to medical advance directive? -  Copy of [place] in Chart? -  Would patient like information on creating a medical advance directive? No - Patient declined     Chief Complaint  Patient presents with  . Acute Visit    Possible UTI, duration of 2 weeks, experiencing burning when urinating                                                                                               HPI:  Pt is a 68 y.o. male seen today for an acute visit for evaluation of burning with urination.He denies any fever,chills,abdominal or back pain.He has a significant medical history of BPH with urinary obstruction.He does in and out catheter at least three times daily.He states voids at sometimes when the bladder overflows.He denies any fever,chills,abdominal or back pain.    Past Medical History:  Diagnosis Date  . Acute on chronic diastolic CHF (congestive heart failure) ([facility]) 06/01/2017  . Arthritis   . Cancer [facility]) 2010   Prostate  . Chronic kidney disease   . Diabetes mellitus   . Diabetic neuropathy (HCC)    feet   . DVT (deep venous thrombosis) ([facility])   . Genetic testing 06/22/2016   Mr. Provence underwent genetic counseling and testing for hereditary cancer syndromes on 05/14/2016. His results were negative for mutations in all 46 genes analyzed by [facility]'s 46-gene Common Hereditary Cancers Panel. Genes analyzed include: APC, ATM, AXIN2, BARD1, BMPR1A, BRCA1, BRCA2, BRIP1, CDH1, CDKN2A, CHEK2, CTNNA1, DICER1, EPCAM, GREM1, HOXB13, KIT, MEN1, MLH1, MSH2, MSH3, MSH6, MUTYH, NB  . GERD (gastroesophageal reflux disease)   . Hypertension   . PE (pulmonary thromboembolism) ([facility])   . Pneumonia   . Sleep apnea    not wearing CPAP   Past Surgical History:  Procedure Laterality Date  . CARDIOVERSION N/A 06/03/2017   Procedure: CARDIOVERSION;  Surgeon: Jerline Pain, MD;  Location: [facility] ENDOSCOPY;  Service: Cardiovascular;  Laterality: N/A;  . COLONOSCOPY    . HERNIA REPAIR    . KNEE SURGERY    . MULTIPLE TOOTH EXTRACTIONS    . PROSTATE SURGERY    . RADIOACTIVE SEED IMPLANT    . RIGHT/LEFT HEART CATH AND CORONARY ANGIOGRAPHY N/A 07/06/2017   Procedure: RIGHT/LEFT HEART CATH AND CORONARY  ANGIOGRAPHY;  Surgeon: Jettie Booze, MD;  Location: [facility];  Service: Cardiovascular;  Laterality: N/A;  . SHOULDER SURGERY    . TOTAL KNEE ARTHROPLASTY Right 11/19/2016   Procedure: RIGHT TOTAL KNEE ARTHROPLASTY;  Surgeon: Leandrew Koyanagi, MD;  Location: [facility];  Service: Orthopedics;  Laterality: Right;  . uretha surgery-2014      Allergies  Allergen Reactions  . Lisinopril Cough    Outpatient Encounter Medications as of 03/14/2018  Medication Sig  . AMBULATORY NON FORMULARY MEDICATION Full Face CPAP Mask with Tubing Dx: G47.30  . amLODipine (NORVASC) 10 MG tablet Take 1 tablet (10 mg total) by mouth daily. For high blood pressure  . aspirin EC 81 MG tablet Take 81 mg by mouth daily.  Marland Kitchen atorvastatin (LIPITOR) 20 MG tablet Take one tablet by mouth once daily for cholesterol  . carvedilol (COREG) 12.5  MG tablet Take 1 tablet (12.5 mg total) by mouth 2 (two) times daily with a meal.  . empagliflozin (JARDIANCE) 10 MG TABS tablet Take 10 mg by mouth daily.  . furosemide (LASIX) 40 MG tablet Take 1 tablet (40 mg total) by mouth daily.  Marland Kitchen glipiZIDE (GLUCOTROL) 5 MG tablet TAKE 1 TABLET (5 MG TOTAL) BY MOUTH DAILY BEFORE BREAKFAST.  . hydrALAZINE (APRESOLINE) 25 MG tablet Take 1 tablet (25 mg total) by mouth 3 (three) times daily.  Marland Kitchen losartan (COZAAR) 100 MG tablet TAKE 1 TABLET BY MOUTH DAILY.  . metFORMIN (GLUCOPHAGE) 500 MG tablet Take 1 tablet (500 mg total) by mouth 2 (two) times daily with a meal.  . pantoprazole (PROTONIX) 40 MG tablet Take 1 tablet (40 mg total) by mouth daily.  . potassium chloride SA (K-DUR,KLOR-CON) 20 MEQ tablet Take 1 tablet (20 mEq total) by mouth 2 (two) times daily.  . rivaroxaban (XARELTO) 20 MG TABS tablet Take 1 tablet (20 mg total) by mouth daily with supper.  . senna-docusate (SENOKOT S) 8.6-50 MG tablet Take 1 tablet by mouth at bedtime as needed.  . traMADol (ULTRAM) 50 MG tablet TAKE 1 TABLET BY MOUTH EVERY 6 HOURS AS NEEDED FOR PAIN  . TRUE METRIX BLOOD GLUCOSE TEST test strip CHECK BLOOD SUGAR TWICE PER DAY  . TRUEPLUS LANCETS 30G MISC USE TO TEST BLOOD SUGAR 2 TIMES A DAY  . UNABLE TO FIND CPAP Hose: use daily with CPAP machine. DX G47.30   No facility-administered encounter medications on file as of 03/14/2018.     Review of Systems  Constitutional: Negative for appetite change, chills, fatigue and fever.  Gastrointestinal: Negative for abdominal distention, abdominal pain, constipation, diarrhea, nausea and vomiting.  Genitourinary: Positive for dysuria. Negative for flank pain and hematuria.       History of BPH with urine obstruction.   Musculoskeletal: Positive for gait problem. Negative for back pain.  Neurological: Negative for dizziness, light-headedness and headaches.  Psychiatric/Behavioral: Negative for agitation, confusion and sleep  disturbance.    Immunization History  Administered Date(s) Administered  . Influenza, High Dose Seasonal PF 12/14/2016  . Influenza,inj,Quad PF,6+ Mos 11/22/2013, 12/05/2014, 10/24/2015  . Influenza-Unspecified 11/29/2017  . Pneumococcal Conjugate-13 05/23/2014  . Pneumococcal Polysaccharide-23 12/13/2012, 05/21/2016   Pertinent  Health Maintenance Due  Topic Date Due  . OPHTHALMOLOGY EXAM  01/18/2018  . COLONOSCOPY  02/16/2018  . HEMOGLOBIN A1C  06/10/2018  . FOOT EXAM  12/10/2018  . INFLUENZA VACCINE  Completed  . PNA vac Low Risk Adult  Completed   Fall Risk  12/09/2017 12/09/2017 07/20/2017 05/06/2017 02/04/2017  Falls in the past year? No No No No No  Number falls in past yr: - - - - -  Injury with Fall? - - - - -  Comment - - - - -    Vitals:   03/14/18 1500  BP: (!) 162/74  Pulse: 67  Temp: 97.7 F (36.5 C)  TempSrc: Oral  SpO2: 93%  Weight: (!) 332 lb 12.8 oz (151 kg)  Height: '5\' 9"'$  (1.753 m)   Body mass index is 49.15 kg/m. Physical Exam Constitutional:      General: He is not in acute distress.    Appearance: He is obese. He is not ill-appearing.  HENT:     Head: Normocephalic.  Eyes:     General: No scleral icterus.       Right eye: No discharge.        Left eye: No discharge.     Conjunctiva/sclera: Conjunctivae normal.     Pupils: Pupils are equal, round, and reactive to light.  Cardiovascular:     Rate and Rhythm: Normal rate and regular rhythm.     Pulses: Normal pulses.     Heart sounds: Normal heart sounds. No murmur. No friction rub. No gallop.   Pulmonary:     Effort: Pulmonary effort is normal. No respiratory distress.     Breath sounds: Normal breath sounds. No wheezing, rhonchi or rales.  Chest:     Chest wall: No tenderness.  Abdominal:     General: Bowel sounds are normal. There is no distension.     Palpations: Abdomen is soft. There is no mass.     Tenderness: There is no abdominal tenderness. There is no right CVA tenderness,  left CVA tenderness, guarding or rebound.  Musculoskeletal: Normal range of motion.        General: No tenderness.     Comments: Bilateral lower extremities trace edema  Skin:    General: Skin is warm and dry.     Coloration: Skin is not pale.     Findings: No erythema.  Neurological:     Mental Status: He is alert and oriented to person, place, and time.     Gait: Gait abnormal.     Comments: Ambulates with a cane   Psychiatric:        Mood and Affect: Mood normal.        Behavior: Behavior normal.        Thought Content: Thought content normal.        Judgment: Judgment normal.     Labs reviewed: Recent Labs    06/01/17 0236  06/03/17 0338 06/29/17 0755 07/15/17 0917 12/09/17 1142  NA  --    < > 141 141 145* 145  K  --    < > 3.6 3.9 3.8 4.0  CL  --    < > 107 102 104 107  CO2  --    < > '25 23 24 27  '$ GLUCOSE  --    < > 237* 182* 183* 203*  BUN  --    < > '18 16 13 11  '$ CREATININE  --    < > 1.37* 1.01 0.95 1.06  CALCIUM  --    < > 8.3* 9.0 8.8 8.9  MG 1.7  --  2.1  --   --   --    < > = values in this interval not displayed.   Recent Labs    05/04/17 0812 05/31/17 1758 12/09/17 1142  AST  10 14* 15  ALT 9 13* 11  ALKPHOS  --  67  --   BILITOT 0.6 0.7 0.6  PROT 7.0 7.6 7.5  ALBUMIN  --  3.6  --    Recent Labs    05/31/17 1758  06/03/17 0338 06/29/17 0755 12/09/17 1142  WBC 8.0   < > 7.3 7.2 8.2  NEUTROABS 5.8  --   --   --  6,224  HGB 12.0*   < > 11.4* 12.0* 12.8*  HCT 37.0*   < > 36.6* 36.2* 40.1  MCV 84.3   < > 86.1 83 80.8  PLT 275   < > 286 307 342   < > = values in this interval not displayed.   Lab Results  Component Value Date   TSH 1.471 06/01/2017   Lab Results  Component Value Date   HGBA1C 7.4 (H) 12/09/2017   Lab Results  Component Value Date   CHOL 143 12/09/2017   HDL 46 12/09/2017   LDLCALC 82 12/09/2017   TRIG 74 12/09/2017   CHOLHDL 3.1 12/09/2017    Significant Diagnostic Results in last 30 days:  No results  found.  Assessment/Plan   Symptoms of urinary tract infection Afebrile.No acute distress.Unable to void during visit to provide specimen.urine ordered for  U/A and C/S rule out UTI.Sterile urine specimen cup given to patient then bring the urine to office in the morning. Encouraged to notify provider if running any fever > 100.5 or worsening symptoms.  Family/ staff Communication: Reviewed plan of care with patient.   Labs/tests ordered: urine for U/A and C/S rule out UTI patient to bring specimen in the morning.     Addendum: 03/21/2018  First Urine specimen contaminated second urine specimen collected showed < 100,000 colonies of streptococcus agalactiae.will treat clinically due to presenting symptoms and a high risk for UTI due to in and out craterization.Start on Keflex 500 mg capsule one by mouth every 6 hours x 7 days along with Florastor 250 mg capsule one by mouth twice daily x 10 days.    Sandrea Hughs, NP

## 2018-03-14 NOTE — Patient Instructions (Addendum)
1. Please collect urine specimen then bring the specimen in the morning. 2. Notify provider if running any fever >100.5 or symptoms worsen.

## 2018-03-15 ENCOUNTER — Other Ambulatory Visit: Payer: Self-pay

## 2018-03-15 DIAGNOSIS — R399 Unspecified symptoms and signs involving the genitourinary system: Secondary | ICD-10-CM

## 2018-03-16 LAB — URINALYSIS, ROUTINE W REFLEX MICROSCOPIC
Bilirubin Urine: NEGATIVE
Glucose, UA: NEGATIVE
Hyaline Cast: NONE SEEN /LPF
Nitrite: NEGATIVE
Specific Gravity, Urine: 1.01 (ref 1.001–1.03)
pH: 8 (ref 5.0–8.0)

## 2018-03-16 LAB — URINE CULTURE
MICRO NUMBER:: 115367
SPECIMEN QUALITY:: ADEQUATE

## 2018-03-16 LAB — MICROSCOPIC MESSAGE

## 2018-03-17 ENCOUNTER — Other Ambulatory Visit: Payer: Self-pay

## 2018-03-17 ENCOUNTER — Other Ambulatory Visit: Payer: Medicare HMO

## 2018-03-17 DIAGNOSIS — R399 Unspecified symptoms and signs involving the genitourinary system: Secondary | ICD-10-CM | POA: Diagnosis not present

## 2018-03-17 DIAGNOSIS — I2699 Other pulmonary embolism without acute cor pulmonale: Secondary | ICD-10-CM | POA: Diagnosis not present

## 2018-03-17 DIAGNOSIS — J9611 Chronic respiratory failure with hypoxia: Secondary | ICD-10-CM | POA: Diagnosis not present

## 2018-03-18 LAB — URINE CULTURE
MICRO NUMBER:: 129205
SPECIMEN QUALITY:: ADEQUATE

## 2018-03-21 MED ORDER — SACCHAROMYCES BOULARDII 250 MG PO CAPS: 250.0000 mg | ORAL_CAPSULE | Freq: Two times a day (BID) | ORAL | 0 refills | Status: AC

## 2018-03-21 MED ORDER — CEPHALEXIN 500 MG PO CAPS: 500.0000 mg | ORAL_CAPSULE | Freq: Four times a day (QID) | ORAL | 0 refills | Status: AC

## 2018-03-21 MED FILL — CEPHALEXIN 500 MG CAPSULE: 500 | 7 days supply | Qty: 28 | Fill #0

## 2018-03-21 NOTE — Addendum Note (Signed)
Addended byMarlowe Sax C on: 03/21/2018 10:13 AM   Modules accepted: Orders

## 2018-03-28 DIAGNOSIS — G4733 Obstructive sleep apnea (adult) (pediatric): Secondary | ICD-10-CM | POA: Diagnosis not present

## 2018-03-29 DIAGNOSIS — I2699 Other pulmonary embolism without acute cor pulmonale: Secondary | ICD-10-CM | POA: Diagnosis not present

## 2018-03-29 DIAGNOSIS — J9611 Chronic respiratory failure with hypoxia: Secondary | ICD-10-CM | POA: Diagnosis not present

## 2018-04-14 ENCOUNTER — Ambulatory Visit (INDEPENDENT_AMBULATORY_CARE_PROVIDER_SITE_OTHER): Payer: Medicare HMO | Admitting: Nurse Practitioner

## 2018-04-14 ENCOUNTER — Encounter: Payer: Self-pay | Admitting: Nurse Practitioner

## 2018-04-14 VITALS — BP 138/80 | HR 64 | Temp 97.6°F | Ht 69.0 in | Wt 324.2 lb

## 2018-04-14 DIAGNOSIS — I10 Essential (primary) hypertension: Secondary | ICD-10-CM | POA: Diagnosis not present

## 2018-04-14 DIAGNOSIS — E785 Hyperlipidemia, unspecified: Secondary | ICD-10-CM

## 2018-04-14 DIAGNOSIS — M170 Bilateral primary osteoarthritis of knee: Secondary | ICD-10-CM | POA: Diagnosis not present

## 2018-04-14 DIAGNOSIS — I509 Heart failure, unspecified: Secondary | ICD-10-CM

## 2018-04-14 DIAGNOSIS — Z8546 Personal history of malignant neoplasm of prostate: Secondary | ICD-10-CM

## 2018-04-14 DIAGNOSIS — I480 Paroxysmal atrial fibrillation: Secondary | ICD-10-CM

## 2018-04-14 DIAGNOSIS — G4733 Obstructive sleep apnea (adult) (pediatric): Secondary | ICD-10-CM

## 2018-04-14 DIAGNOSIS — E1122 Type 2 diabetes mellitus with diabetic chronic kidney disease: Secondary | ICD-10-CM

## 2018-04-14 DIAGNOSIS — Z1211 Encounter for screening for malignant neoplasm of colon: Secondary | ICD-10-CM | POA: Diagnosis not present

## 2018-04-14 DIAGNOSIS — Z1212 Encounter for screening for malignant neoplasm of rectum: Secondary | ICD-10-CM

## 2018-04-14 MED ORDER — GLIPIZIDE 5 MG PO TABS: 5.0000 mg | ORAL_TABLET | Freq: Every day | ORAL | 1 refills | Status: DC

## 2018-04-14 MED ORDER — EMPAGLIFLOZIN 10 MG PO TABS: 10.0000 mg | ORAL_TABLET | Freq: Every day | ORAL | 1 refills | Status: DC

## 2018-04-14 MED ORDER — CARVEDILOL 12.5 MG PO TABS: 12.5000 mg | ORAL_TABLET | Freq: Two times a day (BID) | ORAL | 1 refills | Status: DC

## 2018-04-14 MED ORDER — ATORVASTATIN CALCIUM 20 MG PO TABS: ORAL_TABLET | ORAL | 1 refills | Status: DC

## 2018-04-14 MED ORDER — LOSARTAN POTASSIUM 100 MG PO TABS: 100.0000 mg | ORAL_TABLET | Freq: Every day | ORAL | 1 refills | Status: DC

## 2018-04-14 MED ORDER — TRAMADOL HCL 50 MG PO TABS: 50.0000 mg | ORAL_TABLET | Freq: Four times a day (QID) | ORAL | 0 refills | Status: DC | PRN

## 2018-04-14 MED ORDER — TETANUS-DIPHTH-ACELL PERTUSSIS 5-2.5-18.5 LF-MCG/0.5 IM SUSP: 0.5000 mL | Freq: Once | INTRAMUSCULAR | 0 refills | Status: AC

## 2018-04-14 MED ORDER — METFORMIN HCL 500 MG PO TABS: 500.0000 mg | ORAL_TABLET | Freq: Two times a day (BID) | ORAL | 1 refills | Status: DC

## 2018-04-14 MED FILL — LOSARTAN POTASSIUM 100 MG T: 100 | 90 days supply | Qty: 90 | Fill #0

## 2018-04-14 MED FILL — traMADol HCL 50 MG TABS: 50 | 8 days supply | Qty: 30 | Fill #0

## 2018-04-14 MED FILL — CARVEDILOL 12.5 MG TABLET: 12.5 | 90 days supply | Qty: 180 | Fill #0

## 2018-04-14 MED FILL — ATORVASTATIN 20 MG TABLET: 20 | 90 days supply | Qty: 90 | Fill #0

## 2018-04-14 MED FILL — JARDIANCE 10 MG TABLET: 10 | 30 days supply | Qty: 30 | Fill #0

## 2018-04-14 NOTE — Progress Notes (Signed)
Careteam: Patient Care Team: [name], NP as PCP - General (Geriatric Medicine) Jettie Booze, MD as PCP - Cardiology (Cardiology) Katy Fitch, Darlina Guys, MD as Consulting Physician (Ophthalmology)  Advanced Directive information Does Patient Have a Medical Advance Directive?: No, Would patient like information on creating a medical advance directive?: No - Patient declined  Allergies  Allergen Reactions  . Lisinopril Cough    Chief Complaint  Patient presents with  . Medical Management of Chronic Issues    4 month follow up.eye exam scheduled,need colonoscopy      HPI: Patient is a 68 y.o. male seen in the office today for routine follow up.   Pt with medical history significant ofhypertension, hyperlipidemia, diabetes mellitus, OSA, GERD, DVT and PE onXarelto, CKD-III, dCHF and prostate cancer, who presents with left-sided intermittent chest pain for 2 days with increased shortness of breath and noted to be in afib. He was cardioverted and then set up for cardiac cath.  Needs referral for screening colonoscopy   CHF/HTN- taking norvasc, coreg, lasix with K supplement, hydralazine, losartan with ASA 81 mg daily following with cardiology. No chest pains, edema.   Hx of PE - on long term xarelto, no bleeding or bruising .   GERD- controlled on protonix.   Constipation- controlled on senna-docusate  Having flare up in right elbow for OA been using tramadol and cream which has has been helpful.   DM- A1c 7.4 in October Not taking jardiance this month due to cost.  Metformin and glipizide have controlled blood sugars- fasting below 100s, no hypoglycemia. Currently has given up bread and down 10 lbs since last OV. No exercise routinely. Has stationary bike but does not use routinely  OSA- got updated CPAP supplies, uses this nightly   pt self caths and voids, he had episodes of burning with urination and was seen for possible UTI in January, treated  with keflex, no additional burning with urinary.   Hyperlipidemia- taking lipitor 20 mg by mouth daily.   Obesity- doing keto at this time. No hypoglycemia noted.   Review of Systems:  Review of Systems  Constitutional: Negative for chills, fever and malaise/fatigue.  HENT: Negative for congestion, ear discharge, ear pain, hearing loss, sinus pain, sore throat and tinnitus.   Eyes: Negative for blurred vision.  Respiratory: Negative for cough and shortness of breath.   Cardiovascular: Negative for chest pain and palpitations.  Gastrointestinal: Negative for abdominal pain, constipation, diarrhea, nausea and vomiting.  Genitourinary: Negative for dysuria and frequency.  Musculoskeletal: Positive for joint pain (right elbow) and myalgias.  Neurological: Positive for dizziness and tingling (neuropathy). Negative for sensory change, weakness and headaches.  Endo/Heme/Allergies: Negative for environmental allergies.  Psychiatric/Behavioral: Negative for memory loss.    Past Medical History:  Diagnosis Date  . Acute on chronic diastolic CHF (congestive heart failure) ([place]) 06/01/2017  . Arthritis   . Cancer [facility]) 2010   Prostate  . Chronic kidney disease   . Diabetes mellitus   . Diabetic neuropathy (HCC)    feet  . DVT (deep venous thrombosis) ([place])   . Genetic testing 06/22/2016   Mr. Cai underwent genetic counseling and testing for hereditary cancer syndromes on 05/14/2016. His results were negative for mutations in all 46 genes analyzed by [facility]'s 46-gene Common Hereditary Cancers Panel. Genes analyzed include: APC, ATM, AXIN2, BARD1, BMPR1A, BRCA1, BRCA2, BRIP1, CDH1, CDKN2A, CHEK2, CTNNA1, DICER1, EPCAM, GREM1, HOXB13, KIT, MEN1, MLH1, MSH2, MSH3, MSH6, MUTYH, NB  . GERD (gastroesophageal  reflux disease)   . Hypertension   . PE (pulmonary thromboembolism) ([place])   . Pneumonia   . Sleep apnea    not wearing CPAP   Past Surgical History:  Procedure Laterality Date  .  CARDIOVERSION N/A 06/03/2017   Procedure: CARDIOVERSION;  Surgeon: Jerline Pain, MD;  Location: [facility] ENDOSCOPY;  Service: Cardiovascular;  Laterality: N/A;  . COLONOSCOPY    . HERNIA REPAIR    . KNEE SURGERY    . MULTIPLE TOOTH EXTRACTIONS    . PROSTATE SURGERY    . RADIOACTIVE SEED IMPLANT    . RIGHT/LEFT HEART CATH AND CORONARY ANGIOGRAPHY N/A 07/06/2017   Procedure: RIGHT/LEFT HEART CATH AND CORONARY ANGIOGRAPHY;  Surgeon: Jettie Booze, MD;  Location: [place] [facility] LAB;  Service: Cardiovascular;  Laterality: N/A;  . SHOULDER SURGERY    . TOTAL KNEE ARTHROPLASTY Right 11/19/2016   Procedure: RIGHT TOTAL KNEE ARTHROPLASTY;  Surgeon: Leandrew Koyanagi, MD;  Location: [place];  Service: Orthopedics;  Laterality: Right;  . uretha surgery-2014     Social History:   reports that he has never smoked. He has never used smokeless tobacco. He reports that he does not drink alcohol or use drugs.  Family History  Problem Relation Age of Onset  . Breast cancer Mother 22       d.89  . Breast cancer Sister 32       d.30  . Leukemia Brother 18       d.20  . Breast cancer Maternal Aunt 40       d.40s  . Lung cancer Maternal Uncle   . Prostate cancer Paternal Uncle   . Prostate cancer Brother        recurred recently at age 30  . Other Brother 15       spinal tumor  . Cervical cancer Other 22       d.22  . Cancer Sister 60       unspecified type  . Cancer Maternal Uncle 80       unspecified type    Medications: Patient's Medications  New Prescriptions   No medications on file  Previous Medications   AMBULATORY NON FORMULARY MEDICATION    Full Face CPAP Mask with Tubing Dx: G47.30   AMLODIPINE (NORVASC) 10 MG TABLET    Take 1 tablet (10 mg total) by mouth daily. For high blood pressure   ASPIRIN EC 81 MG TABLET    Take 81 mg by mouth daily.   ATORVASTATIN (LIPITOR) 20 MG TABLET    Take one tablet by mouth once daily for cholesterol   CARVEDILOL (COREG) 12.5 MG TABLET    Take 1  tablet (12.5 mg total) by mouth 2 (two) times daily with a meal.   EMPAGLIFLOZIN (JARDIANCE) 10 MG TABS TABLET    Take 10 mg by mouth daily.   FUROSEMIDE (LASIX) 40 MG TABLET    Take 1 tablet (40 mg total) by mouth daily.   GLIPIZIDE (GLUCOTROL) 5 MG TABLET    TAKE 1 TABLET (5 MG TOTAL) BY MOUTH DAILY BEFORE BREAKFAST.   HYDRALAZINE (APRESOLINE) 25 MG TABLET    Take 1 tablet (25 mg total) by mouth 3 (three) times daily.   LOSARTAN (COZAAR) 100 MG TABLET    TAKE 1 TABLET BY MOUTH DAILY.   METFORMIN (GLUCOPHAGE) 500 MG TABLET    Take 1 tablet (500 mg total) by mouth 2 (two) times daily with a meal.   PANTOPRAZOLE (PROTONIX) 40 MG TABLET  Take 1 tablet (40 mg total) by mouth daily.   POTASSIUM CHLORIDE SA (K-DUR,KLOR-CON) 20 MEQ TABLET    Take 1 tablet (20 mEq total) by mouth 2 (two) times daily.   RIVAROXABAN (XARELTO) 20 MG TABS TABLET    Take 1 tablet (20 mg total) by mouth daily with supper.   SENNA-DOCUSATE (SENOKOT S) 8.6-50 MG TABLET    Take 1 tablet by mouth at bedtime as needed.   TRAMADOL (ULTRAM) 50 MG TABLET    TAKE 1 TABLET BY MOUTH EVERY 6 HOURS AS NEEDED FOR PAIN   TRUE METRIX BLOOD GLUCOSE TEST TEST STRIP    CHECK BLOOD SUGAR TWICE PER DAY   TRUEPLUS LANCETS 30G MISC    USE TO TEST BLOOD SUGAR 2 TIMES A DAY   UNABLE TO FIND    CPAP Hose: use daily with CPAP machine. DX G47.30  Modified Medications   No medications on file  Discontinued Medications   No medications on file     Physical Exam:  Vitals:   04/14/18 0837  BP: 138/80  Pulse: 64  Temp: 97.6 F (36.4 C)  TempSrc: Oral  SpO2: 95%  Weight: (!) 324 lb 3.2 oz (147.1 kg)  Height: _0  (1.753 m)   Body mass index is 47.88 kg/m.  Physical Exam Constitutional:      General: He is not in acute distress.    Appearance: He is obese. He is not ill-appearing.  HENT:     Head: Normocephalic.     Nose: Nose normal.  Eyes:     Conjunctiva/sclera: Conjunctivae normal.     Pupils: Pupils are equal, round, and  reactive to light.  Cardiovascular:     Rate and Rhythm: Normal rate and regular rhythm.     Pulses: Normal pulses.     Heart sounds: Normal heart sounds. No murmur. No friction rub. No gallop.   Pulmonary:     Effort: Pulmonary effort is normal. No respiratory distress.     Breath sounds: Normal breath sounds. No wheezing, rhonchi or rales.  Chest:     Chest wall: No tenderness.  Abdominal:     General: Bowel sounds are normal. There is no distension.     Palpations: Abdomen is soft. There is no mass.     Tenderness: There is no abdominal tenderness. There is no guarding or rebound.  Musculoskeletal: Normal range of motion.        General: No swelling or tenderness.     Comments: Bilateral lower extremities trace edema  Skin:    General: Skin is warm and dry.     Coloration: Skin is not pale.     Findings: No erythema.  Neurological:     Mental Status: He is alert and oriented to person, place, and time.     Gait: Gait normal.  Psychiatric:        Mood and Affect: Mood normal.        Behavior: Behavior normal.        Thought Content: Thought content normal.        Judgment: Judgment normal.     Labs reviewed: Basic Metabolic Panel: Recent Labs    06/01/17 0236  06/03/17 0338 06/29/17 0755 07/15/17 0917 12/09/17 1142  NA  --    < > 141 141 145* 145  K  --    < > 3.6 3.9 3.8 4.0  CL  --    < > 107 102 104 107  CO2  --    < >  _0 GLUCOSE  --    < > 237* 182* 183* 203*  BUN  --    < > _1 CREATININE  --    < > 1.37* 1.01 0.95 1.06  CALCIUM  --    < > 8.3* 9.0 8.8 8.9  MG 1.7  --  2.1  --   --   --   TSH 1.471  --   --   --   --   --    < > = values in this interval not displayed.   Liver Function Tests: Recent Labs    05/04/17 0812 05/31/17 1758 12/09/17 1142  AST 10 14* 15  ALT 9 13* 11  ALKPHOS  --  67  --   BILITOT 0.6 0.7 0.6  PROT 7.0 7.6 7.5  ALBUMIN  --  3.6  --    No results for input(s): LIPASE, AMYLASE in the last 8760  hours. No results for input(s): AMMONIA in the last 8760 hours. CBC: Recent Labs    05/31/17 1758  06/03/17 0338 06/29/17 0755 12/09/17 1142  WBC 8.0   < > 7.3 7.2 8.2  NEUTROABS 5.8  --   --   --  6,224  HGB 12.0*   < > 11.4* 12.0* 12.8*  HCT 37.0*   < > 36.6* 36.2* 40.1  MCV 84.3   < > 86.1 83 80.8  PLT 275   < > 286 307 342   < > = values in this interval not displayed.   Lipid Panel: Recent Labs    05/04/17 0812 06/01/17 0236 12/09/17 1142  CHOL 146 127 143  HDL 47 44 46  LDLCALC 80 64 82  TRIG 104 96 74  CHOLHDL 3.1 2.9 3.1   TSH: Recent Labs    06/01/17 0236  TSH 1.471   A1C: Lab Results  Component Value Date   HGBA1C 7.4 (H) 12/09/2017     Assessment/Plan 1. Hyperlipidemia LDL goal <70 -pt currently on keto diet which is high fat, discussed that he has hyperlipidemia and this diet is not complaint with heart healthy eating plan.  - atorvastatin (LIPITOR) 20 MG tablet; Take one tablet by mouth once daily for cholesterol  Dispense: 90 tablet; Refill: 1 - Lipid Panel - COMPLETE METABOLIC PANEL WITH GFR  2. Chronic congestive heart failure, unspecified heart failure type (HCC) Stable, euvolemic - carvedilol (COREG) 12.5 MG tablet; Take 1 tablet (12.5 mg total) by mouth 2 (two) times daily with a meal.  Dispense: 180 tablet; Refill: 1  3. Type 2 diabetes mellitus with chronic kidney disease, without long-term current use of insulin, unspecified CKD stage ([place]) -has made dietary modifications. Will follow up A1c today.  - empagliflozin (JARDIANCE) 10 MG TABS tablet; Take 10 mg by mouth daily.  Dispense: 90 tablet; Refill: 1 - glipiZIDE (GLUCOTROL) 5 MG tablet; Take 1 tablet (5 mg total) by mouth daily before breakfast.  Dispense: 90 tablet; Refill: 1 - metFORMIN (GLUCOPHAGE) 500 MG tablet; Take 1 tablet (500 mg total) by mouth 2 (two) times daily with a meal.  Dispense: 180 tablet; Refill: 1 - Hemoglobin A1c  4. Essential hypertension Stable on current  regimen.  - losartan (COZAAR) 100 MG tablet; Take 1 tablet (100 mg total) by mouth daily.  Dispense: 90 tablet; Refill: 1  5. Primary osteoarthritis of both knees -ongoing, encouraged weight loss and activity as tolerates.  - traMADol (ULTRAM) 50 MG tablet; Take 1 tablet (  50 mg total) by mouth every 6 (six) hours as needed. for pain  Dispense: 30 tablet; Refill: 0  6. Encounter for colorectal cancer screening - Ambulatory referral to Gastroenterology  7. H/O prostate cancer -following with urologist.   8. OSA (obstructive sleep apnea) Continues on CPAP  9. Severe obesity (BMI >= 40) (HCC) Weight loss encouraged, advised keto diet is not for those with co-moribidies. Low fat, diabetic diet encouraged. Can refer to weight loss management if needed.    10. Paroxysmal atrial fibrillation (HCC) Rate controlled, continues on xarelto.  - CBC with Differential/Platelets  11. Right elbow pain Not currently having pain, no limitations in ROM. May use ice as needed.to continue muscle rub with ice   Next appt: 4 months for routine follow up  [place]. [place], [facility] [phone]

## 2018-04-14 NOTE — Patient Instructions (Signed)
To use ice and muscle rub to elbow, continue tramadol as needed   DASH Eating Plan DASH stands for "Dietary Approaches to Stop Hypertension." The DASH eating plan is a healthy eating plan that has been shown to reduce high blood pressure (hypertension). It may also reduce your risk for type 2 diabetes, heart disease, and stroke. The DASH eating plan may also help with weight loss. What are tips for following this plan?  General guidelines  Avoid eating more than 2,300 mg (milligrams) of salt (sodium) a day. If you have hypertension, you may need to reduce your sodium intake to 1,500 mg a day.  Limit alcohol intake to no more than 1 drink a day for nonpregnant women and 2 drinks a day for men. One drink equals 12 oz of beer, 5 oz of wine, or 1 oz of hard liquor.  Work with your health care provider to maintain a healthy body weight or to lose weight. Ask what an ideal weight is for you.  Get at least 30 minutes of exercise that causes your heart to beat faster (aerobic exercise) most days of the week. Activities may include walking, swimming, or biking.  Work with your health care provider or diet and nutrition specialist (dietitian) to adjust your eating plan to your individual calorie needs. Reading food labels   Check food labels for the amount of sodium per serving. Choose foods with less than 5 percent of the Daily Value of sodium. Generally, foods with less than 300 mg of sodium per serving fit into this eating plan.  To find whole grains, look for the word "whole" as the first word in the ingredient list. Shopping  Buy products labeled as "low-sodium" or "no salt added."  Buy fresh foods. Avoid canned foods and premade or frozen meals. Cooking  Avoid adding salt when cooking. Use salt-free seasonings or herbs instead of table salt or sea salt. Check with your health care provider or pharmacist before using salt substitutes.  Do not fry foods. Cook foods using healthy methods  such as baking, boiling, grilling, and broiling instead.  Cook with heart-healthy oils, such as olive, canola, soybean, or sunflower oil. Meal planning  Eat a balanced diet that includes: ? 5 or more servings of fruits and vegetables each day. At each meal, try to fill half of your plate with fruits and vegetables. ? Up to 6-8 servings of whole grains each day. ? Less than 6 oz of lean meat, poultry, or fish each day. A 3-oz serving of meat is about the same size as a deck of cards. One egg equals 1 oz. ? 2 servings of low-fat dairy each day. ? A serving of nuts, seeds, or beans 5 times each week. ? Heart-healthy fats. Healthy fats called Omega-3 fatty acids are found in foods such as flaxseeds and coldwater fish, like sardines, salmon, and mackerel.  Limit how much you eat of the following: ? Canned or prepackaged foods. ? Food that is high in trans fat, such as fried foods. ? Food that is high in saturated fat, such as fatty meat. ? Sweets, desserts, sugary drinks, and other foods with added sugar. ? Full-fat dairy products.  Do not salt foods before eating.  Try to eat at least 2 vegetarian meals each week.  Eat more home-cooked food and less restaurant, buffet, and fast food.  When eating at a restaurant, ask that your food be prepared with less salt or no salt, if possible. What foods are recommended?  The items listed may not be a complete list. Talk with your dietitian about what dietary choices are best for you. Grains Whole-grain or whole-wheat bread. Whole-grain or whole-wheat pasta. Brown rice. [place]. Bulgur. Whole-grain and low-sodium cereals. Pita bread. Low-fat, low-sodium crackers. Whole-wheat flour tortillas. Vegetables Fresh or frozen vegetables (raw, steamed, roasted, or grilled). Low-sodium or reduced-sodium tomato and vegetable juice. Low-sodium or reduced-sodium tomato sauce and tomato paste. Low-sodium or reduced-sodium canned vegetables. Fruits All  fresh, dried, or frozen fruit. Canned fruit in natural juice (without added sugar). Meat and other protein foods Skinless chicken or [place]. Ground chicken or [place]. Pork with fat trimmed off. Fish and seafood. Egg whites. Dried beans, peas, or lentils. Unsalted nuts, nut butters, and seeds. Unsalted canned beans. Lean cuts of beef with fat trimmed off. Low-sodium, lean deli meat. Dairy Low-fat (1%) or fat-free (skim) milk. Fat-free, low-fat, or reduced-fat cheeses. Nonfat, low-sodium ricotta or cottage cheese. Low-fat or nonfat yogurt. Low-fat, low-sodium cheese. Fats and oils Soft margarine without trans fats. Vegetable oil. Low-fat, reduced-fat, or light mayonnaise and salad dressings (reduced-sodium). Canola, safflower, olive, soybean, and sunflower oils. Avocado. Seasoning and other foods Herbs. Spices. Seasoning mixes without salt. Unsalted popcorn and pretzels. Fat-free sweets. What foods are not recommended? The items listed may not be a complete list. Talk with your dietitian about what dietary choices are best for you. Grains Baked goods made with fat, such as croissants, muffins, or some breads. Dry pasta or rice meal packs. Vegetables Creamed or fried vegetables. Vegetables in a cheese sauce. Regular canned vegetables (not low-sodium or reduced-sodium). Regular canned tomato sauce and paste (not low-sodium or reduced-sodium). Regular tomato and vegetable juice (not low-sodium or reduced-sodium). Angie Fava. Olives. Fruits Canned fruit in a light or heavy syrup. Fried fruit. Fruit in cream or butter sauce. Meat and other protein foods Fatty cuts of meat. Ribs. Fried meat. Berniece Salines. Sausage. Bologna and other processed lunch meats. Salami. Fatback. Hotdogs. Bratwurst. Salted nuts and seeds. Canned beans with added salt. Canned or smoked fish. Whole eggs or egg yolks. Chicken or [place] with skin. Dairy Whole or 2% milk, cream, and half-and-half. Whole or full-fat cream cheese. Whole-fat or  sweetened yogurt. Full-fat cheese. Nondairy creamers. Whipped toppings. Processed cheese and cheese spreads. Fats and oils Butter. Stick margarine. Lard. Shortening. Ghee. Bacon fat. Tropical oils, such as coconut, palm kernel, or palm oil. Seasoning and other foods Salted popcorn and pretzels. Onion salt, garlic salt, seasoned salt, table salt, and sea salt. Worcestershire sauce. Tartar sauce. Barbecue sauce. Teriyaki sauce. Soy sauce, including reduced-sodium. Steak sauce. Canned and packaged gravies. Fish sauce. Oyster sauce. Cocktail sauce. Horseradish that you find on the shelf. Ketchup. Mustard. Meat flavorings and tenderizers. Bouillon cubes. Hot sauce and Tabasco sauce. Premade or packaged marinades. Premade or packaged taco seasonings. Relishes. Regular salad dressings. Where to find more information:  National Heart, Lung, and [place]: https://wilson-eaton.com/  American Heart Association: www.heart.org Summary  The DASH eating plan is a healthy eating plan that has been shown to reduce high blood pressure (hypertension). It may also reduce your risk for type 2 diabetes, heart disease, and stroke.  With the DASH eating plan, you should limit salt (sodium) intake to 2,300 mg a day. If you have hypertension, you may need to reduce your sodium intake to 1,500 mg a day.  When on the DASH eating plan, aim to eat more fresh fruits and vegetables, whole grains, lean proteins, low-fat dairy, and heart-healthy fats.  Work with your health care  provider or diet and nutrition specialist (dietitian) to adjust your eating plan to your individual calorie needs. This information is not intended to replace advice given to you by your health care provider. Make sure you discuss any questions you have with your health care provider. Document Released: 01/22/2011 Document Revised: 01/27/2016 Document Reviewed: 01/27/2016 Elsevier Interactive Patient Education  2019 [employer].

## 2018-04-15 LAB — CBC WITH DIFFERENTIAL/PLATELET
Absolute Monocytes: 452 cells/uL (ref 200–950)
Basophils Absolute: 39 cells/uL (ref 0–200)
Basophils Relative: 0.5 %
Eosinophils Absolute: 47 cells/uL (ref 15–500)
Eosinophils Relative: 0.6 %
HCT: 40.3 % (ref 38.5–50.0)
Hemoglobin: 13 g/dL — ABNORMAL LOW (ref 13.2–17.1)
Lymphs Abs: 1427 cells/uL (ref 850–3900)
MCH: 26.3 pg — ABNORMAL LOW (ref 27.0–33.0)
MCHC: 32.3 g/dL (ref 32.0–36.0)
MCV: 81.6 fL (ref 80.0–100.0)
MPV: 10.5 fL (ref 7.5–12.5)
Monocytes Relative: 5.8 %
Neutro Abs: 5834 cells/uL (ref 1500–7800)
Neutrophils Relative %: 74.8 %
Platelets: 300 10*3/uL (ref 140–400)
RBC: 4.94 10*6/uL (ref 4.20–5.80)
RDW: 14.5 % (ref 11.0–15.0)
Total Lymphocyte: 18.3 %
WBC: 7.8 10*3/uL (ref 3.8–10.8)

## 2018-04-15 LAB — LIPID PANEL
Cholesterol: 186 mg/dL (ref <200)
HDL: 43 mg/dL (ref >=40)
LDL Cholesterol (Calc): 121 mg/dL (calc) — ABNORMAL HIGH
Non-HDL Cholesterol (Calc): 143 mg/dL (calc) — ABNORMAL HIGH (ref <130)
Total CHOL/HDL Ratio: 4.3 (calc) (ref <5.0)
Triglycerides: 109 mg/dL (ref <150)

## 2018-04-15 LAB — COMPLETE METABOLIC PANEL WITH GFR
AG Ratio: 1.2 (calc) (ref 1.0–2.5)
ALT: 14 U/L (ref 9–46)
AST: 9 U/L — ABNORMAL LOW (ref 10–35)
Albumin: 4 g/dL (ref 3.6–5.1)
Alkaline phosphatase (APISO): 74 U/L (ref 35–144)
BUN/Creatinine Ratio: 21 (calc) (ref 6–22)
BUN: 29 mg/dL — ABNORMAL HIGH (ref 7–25)
CO2: 26 mmol/L (ref 20–32)
Calcium: 9.1 mg/dL (ref 8.6–10.3)
Chloride: 101 mmol/L (ref 98–110)
Creat: 1.35 mg/dL — ABNORMAL HIGH (ref 0.70–1.25)
GFR, Est African American: 63 mL/min/{1.73_m2} (ref >=60)
GFR, Est Non African American: 54 mL/min/{1.73_m2} — ABNORMAL LOW (ref >=60)
Globulin: 3.4 g/dL (calc) (ref 1.9–3.7)
Glucose, Bld: 169 mg/dL — ABNORMAL HIGH (ref 65–99)
Potassium: 3.4 mmol/L — ABNORMAL LOW (ref 3.5–5.3)
Sodium: 140 mmol/L (ref 135–146)
Total Bilirubin: 0.6 mg/dL (ref 0.2–1.2)
Total Protein: 7.4 g/dL (ref 6.1–8.1)

## 2018-04-15 LAB — HEMOGLOBIN A1C
Hgb A1c MFr Bld: 5.8 % of total Hgb — ABNORMAL HIGH (ref <5.7)
Mean Plasma Glucose: 120 (calc)
eAG (mmol/L): 6.6 (calc)

## 2018-04-16 DIAGNOSIS — I2699 Other pulmonary embolism without acute cor pulmonale: Secondary | ICD-10-CM | POA: Diagnosis not present

## 2018-04-16 DIAGNOSIS — J9611 Chronic respiratory failure with hypoxia: Secondary | ICD-10-CM | POA: Diagnosis not present

## 2018-04-18 MED FILL — metFORMIN HCL 500 MG TABS: 500 | 90 days supply | Qty: 180 | Fill #0

## 2018-04-18 MED FILL — POTASSIUM CHLORIDE CRYS ER: 20 | 30 days supply | Qty: 60 | Fill #2

## 2018-04-21 ENCOUNTER — Ambulatory Visit (INDEPENDENT_AMBULATORY_CARE_PROVIDER_SITE_OTHER): Payer: Medicare HMO

## 2018-04-21 ENCOUNTER — Encounter (INDEPENDENT_AMBULATORY_CARE_PROVIDER_SITE_OTHER): Payer: Self-pay | Admitting: Orthopaedic Surgery

## 2018-04-21 ENCOUNTER — Ambulatory Visit (INDEPENDENT_AMBULATORY_CARE_PROVIDER_SITE_OTHER): Payer: Medicare HMO | Admitting: Orthopaedic Surgery

## 2018-04-21 DIAGNOSIS — M25561 Pain in right knee: Secondary | ICD-10-CM

## 2018-04-21 DIAGNOSIS — Z96651 Presence of right artificial knee joint: Secondary | ICD-10-CM

## 2018-04-21 NOTE — Progress Notes (Signed)
 Office Visit Note   Patient: Thomas [name]           Date of Birth: 09/23/1950           MRN: [id number] Visit Date: 04/21/2018              Requested by: [name], Jessica K, NP [address] [place], [place] [address] PCP: [name], Jessica K, NP   Assessment & Plan: Visit Diagnoses:  1. Acute pain of right knee   2. Status post total right knee replacement     Plan: Impression is right knee pain suspect tendinitis or bursitis on the medial side.  Patient cannot take NSAIDs due to being on Xarelto and history of coronary artery disease.  I recommend Tylenol and ice and heat and relative rest.  I would expect this to be self-limiting and resolve.  I will see him back in a year with 2 view x-rays of the right knee.  Follow-Up Instructions: Return in about 1 year (around 04/21/2019).   Orders:  Orders Placed This Encounter  Procedures  . XR KNEE 3 VIEW RIGHT   No orders of the defined types were placed in this encounter.     Procedures: No procedures performed   Clinical Data: No additional findings.   Subjective: Chief Complaint  Patient presents with  . Right Knee - Pain    Mr. Helming is a 68-year-old gentleman who is 17 months status post right total knee replacement.  He has been doing well overall but for the last 2 to 3 weeks he is developed this insidious right medial sided knee pain that is worse with rolling around in bed and with standing.  He denies any injuries.  Denies any swelling.  Denies any mechanical symptoms.   Review of Systems  Constitutional: Negative.   All other systems reviewed and are negative.    Objective: Vital Signs: There were no vitals taken for this visit.  Physical Exam Vitals signs and nursing note reviewed.  Constitutional:      Appearance: He is well-developed.  Pulmonary:     Effort: Pulmonary effort is normal.  Abdominal:     Palpations: Abdomen is soft.  Skin:    General: Skin is warm.  Neurological:   Mental Status: He is alert and oriented to person, place, and time.  Psychiatric:        Behavior: Behavior normal.        Thought Content: Thought content normal.        Judgment: Judgment normal.     Ortho Exam Right knee exam shows no joint effusion.  He has about a 20 degree flexion contracture.  Her surgical scar is fully healed.  Stable to varus valgus.  No pain with valgus stress to the MCL.  He is slightly tender along the peds anserine bursa.  No instability to the knee. Specialty Comments:  No specialty comments available.  Imaging: Xr Knee 3 View Right  Result Date: 04/21/2018 Stable right total knee replacement without complication.    PMFS History: Patient Active Problem List   Diagnosis Date Noted  . Dyspnea 10/19/2017  . Chronic respiratory failure with hypoxia (HCC) 10/19/2017  . Anticoagulated 07/27/2017  . Chronic systolic heart failure (HCC)   . Pulmonary hypertension, unspecified (HCC)   . Chest pain 06/01/2017  . New onset atrial fibrillation (HCC) 06/01/2017  . CKD (chronic kidney disease), stage III (HCC) 06/01/2017  . Acute on chronic diastolic CHF (congestive heart failure) (HCC) 06/01/2017  .   Atrial fibrillation with RVR (HCC) 05/31/2017  . Total knee replacement status 11/19/2016  . Meniscal injury 10/09/2016  . Hypercoagulable state (HCC) 09/22/2016  . Genetic testing 06/22/2016  . DVT, lower extremity, proximal, acute, left (HCC) 01/23/2016  . Hematuria 12/12/2015  . Pulmonary embolism (HCC) 11/23/2015  . Type 2 diabetes mellitus with chronic kidney disease, without long-term current use of insulin (HCC) 12/05/2014  . Benign prostatic hyperplasia with urinary obstruction 07/25/2014  . Urine retention 07/25/2014  . Urinary tract infection 07/09/2014  . Anemia, iron deficiency 11/22/2013  . Urinary retention 11/01/2013  . Incomplete emptying of bladder 07/26/2013  . Nocturia 07/26/2013  . Other nonspecific finding on examination of urine  07/26/2013  . Sleep apnea 07/26/2013  . Urethral stricture 07/26/2013  . Malignant neoplasm of prostate (HCC) 07/04/2013  . Bladder neck contracture 07/04/2013  . Severe obesity (BMI >= 40) (HCC) 05/23/2013  . B12 deficiency 05/23/2013  . Hyperlipidemia with target LDL less than 100 09/02/2012  . HTN (hypertension) 05/26/2012  . Osteoarthritis, knee 05/26/2012  . MORBID OBESITY 08/22/2008   Past Medical History:  Diagnosis Date  . Acute on chronic diastolic CHF (congestive heart failure) (HCC) 06/01/2017  . Arthritis   . Cancer (HCC) 2010   Prostate  . Chronic kidney disease   . Diabetes mellitus   . Diabetic neuropathy (HCC)    feet  . DVT (deep venous thrombosis) (HCC)   . Genetic testing 06/22/2016   Mr. Rajan underwent genetic counseling and testing for hereditary cancer syndromes on 05/14/2016. His results were negative for mutations in all 46 genes analyzed by [facility]'s 46-gene Common Hereditary Cancers Panel. Genes analyzed include: APC, ATM, AXIN2, BARD1, BMPR1A, BRCA1, BRCA2, BRIP1, CDH1, CDKN2A, CHEK2, CTNNA1, DICER1, EPCAM, GREM1, HOXB13, KIT, MEN1, MLH1, MSH2, MSH3, MSH6, MUTYH, NB  . GERD (gastroesophageal reflux disease)   . Hypertension   . PE (pulmonary thromboembolism) (HCC)   . Pneumonia   . Sleep apnea    not wearing CPAP    Family History  Problem Relation Age of Onset  . Breast cancer Mother 89       d.89  . Breast cancer Sister 30       d.30  . Leukemia Brother 18       d.20  . Breast cancer Maternal Aunt 40       d.40s  . Lung cancer Maternal Uncle   . Prostate cancer Paternal Uncle   . Prostate cancer Brother        recurred recently at age 68  . Other Brother 15       spinal tumor  . Cervical cancer Other 22       d.22  . Cancer Sister 66       unspecified type  . Cancer Maternal Uncle 80       unspecified type    Past Surgical History:  Procedure Laterality Date  . CARDIOVERSION N/A 06/03/2017   Procedure: CARDIOVERSION;  Surgeon:  Skains, Mark C, MD;  Location: [facility];  Service: Cardiovascular;  Laterality: N/A;  . COLONOSCOPY    . HERNIA REPAIR    . KNEE SURGERY    . MULTIPLE TOOTH EXTRACTIONS    . PROSTATE SURGERY    . RADIOACTIVE SEED IMPLANT    . RIGHT/LEFT HEART CATH AND CORONARY ANGIOGRAPHY N/A 07/06/2017   Procedure: RIGHT/LEFT HEART CATH AND CORONARY ANGIOGRAPHY;  Surgeon: Varanasi, Jayadeep S, MD;  Location: [facility] INVAS[facility] LAB;  Service: Cardiovascular;  Laterality: N/A;  . SHOULDER   SURGERY    . TOTAL KNEE ARTHROPLASTY Right 11/19/2016   Procedure: RIGHT TOTAL KNEE ARTHROPLASTY;  Surgeon: Leandrew Koyanagi, MD;  Location: [facility];  Service: Orthopedics;  Laterality: Right;  . uretha surgery-2014     Social History   Occupational History  . Not on file  Tobacco Use  . Smoking status: Never Smoker  . Smokeless tobacco: Never Used  Substance and Sexual Activity  . Alcohol use: No    Comment: never  . Drug use: No  . Sexual activity: Yes

## 2018-04-26 DIAGNOSIS — G4733 Obstructive sleep apnea (adult) (pediatric): Secondary | ICD-10-CM | POA: Diagnosis not present

## 2018-04-27 DIAGNOSIS — J9611 Chronic respiratory failure with hypoxia: Secondary | ICD-10-CM | POA: Diagnosis not present

## 2018-04-27 DIAGNOSIS — I2699 Other pulmonary embolism without acute cor pulmonale: Secondary | ICD-10-CM | POA: Diagnosis not present

## 2018-05-02 MED FILL — AMLODIPINE BESYLATE 10 MG T: 10 | 90 days supply | Qty: 90 | Fill #1

## 2018-05-02 MED FILL — glipiZIDE 5 MG TABS: 5 | 90 days supply | Qty: 90 | Fill #0

## 2018-05-16 DIAGNOSIS — I2699 Other pulmonary embolism without acute cor pulmonale: Secondary | ICD-10-CM | POA: Diagnosis not present

## 2018-05-16 DIAGNOSIS — J9611 Chronic respiratory failure with hypoxia: Secondary | ICD-10-CM | POA: Diagnosis not present

## 2018-05-27 DIAGNOSIS — G4733 Obstructive sleep apnea (adult) (pediatric): Secondary | ICD-10-CM | POA: Diagnosis not present

## 2018-05-28 DIAGNOSIS — I2699 Other pulmonary embolism without acute cor pulmonale: Secondary | ICD-10-CM | POA: Diagnosis not present

## 2018-05-28 DIAGNOSIS — J9611 Chronic respiratory failure with hypoxia: Secondary | ICD-10-CM | POA: Diagnosis not present

## 2018-06-02 DIAGNOSIS — G4733 Obstructive sleep apnea (adult) (pediatric): Secondary | ICD-10-CM | POA: Diagnosis not present

## 2018-06-08 ENCOUNTER — Other Ambulatory Visit: Payer: Self-pay | Admitting: Nurse Practitioner

## 2018-06-08 DIAGNOSIS — I10 Essential (primary) hypertension: Secondary | ICD-10-CM

## 2018-06-08 MED FILL — PANTOPRAZOLE SOD DR 40 MG T: 40 | 30 days supply | Qty: 30 | Fill #0

## 2018-06-09 ENCOUNTER — Other Ambulatory Visit: Payer: Self-pay | Admitting: Nurse Practitioner

## 2018-06-09 DIAGNOSIS — M170 Bilateral primary osteoarthritis of knee: Secondary | ICD-10-CM

## 2018-06-09 MED FILL — traMADol HCL 50 MG TABS: 50 | 8 days supply | Qty: 30 | Fill #0

## 2018-06-09 MED FILL — hydrALAZINE HCL 25 MG TABS: 25 | 90 days supply | Qty: 270 | Fill #0

## 2018-06-09 NOTE — Telephone Encounter (Signed)
Last Refill 04/14/2018 #30 [place] Database verified  Routed to provider for approval

## 2018-06-10 MED FILL — FUROSEMIDE 40 MG TAB: 40 | 90 days supply | Qty: 90 | Fill #0

## 2018-06-16 DIAGNOSIS — J9611 Chronic respiratory failure with hypoxia: Secondary | ICD-10-CM | POA: Diagnosis not present

## 2018-06-16 DIAGNOSIS — I2699 Other pulmonary embolism without acute cor pulmonale: Secondary | ICD-10-CM | POA: Diagnosis not present

## 2018-06-26 DIAGNOSIS — G4733 Obstructive sleep apnea (adult) (pediatric): Secondary | ICD-10-CM | POA: Diagnosis not present

## 2018-06-27 DIAGNOSIS — J9611 Chronic respiratory failure with hypoxia: Secondary | ICD-10-CM | POA: Diagnosis not present

## 2018-06-27 DIAGNOSIS — I2699 Other pulmonary embolism without acute cor pulmonale: Secondary | ICD-10-CM | POA: Diagnosis not present

## 2018-06-28 ENCOUNTER — Other Ambulatory Visit: Payer: Self-pay | Admitting: Nurse Practitioner

## 2018-06-28 DIAGNOSIS — M170 Bilateral primary osteoarthritis of knee: Secondary | ICD-10-CM

## 2018-06-28 MED FILL — traMADol HCL 50 MG TABS: 50 | 8 days supply | Qty: 30 | Fill #0

## 2018-06-28 NOTE — Telephone Encounter (Signed)
Received fax from [place] Verified LR: 06/09/2018 #30 Pended Rx and sent to [facility] for approval.

## 2018-06-28 NOTE — Telephone Encounter (Signed)
Last refill 4/23 [place] Database verified Routed to Sherrie Mustache for approval

## 2018-07-05 ENCOUNTER — Other Ambulatory Visit: Payer: Self-pay

## 2018-07-05 DIAGNOSIS — I509 Heart failure, unspecified: Secondary | ICD-10-CM

## 2018-07-05 DIAGNOSIS — K219 Gastro-esophageal reflux disease without esophagitis: Secondary | ICD-10-CM

## 2018-07-05 DIAGNOSIS — I5042 Chronic combined systolic (congestive) and diastolic (congestive) heart failure: Secondary | ICD-10-CM

## 2018-07-05 DIAGNOSIS — E785 Hyperlipidemia, unspecified: Secondary | ICD-10-CM

## 2018-07-05 DIAGNOSIS — I10 Essential (primary) hypertension: Secondary | ICD-10-CM

## 2018-07-05 DIAGNOSIS — E1122 Type 2 diabetes mellitus with diabetic chronic kidney disease: Secondary | ICD-10-CM

## 2018-07-05 MED ORDER — POTASSIUM CHLORIDE CRYS ER 20 MEQ PO TBCR: 20.0000 meq | EXTENDED_RELEASE_TABLET | Freq: Two times a day (BID) | ORAL | 11 refills | Status: DC

## 2018-07-05 MED ORDER — HYDRALAZINE HCL 25 MG PO TABS: 25.0000 mg | ORAL_TABLET | Freq: Three times a day (TID) | ORAL | 3 refills | Status: DC

## 2018-07-05 MED ORDER — GLIPIZIDE 5 MG PO TABS: 5.0000 mg | ORAL_TABLET | Freq: Every day | ORAL | 1 refills | Status: DC

## 2018-07-05 MED ORDER — AMLODIPINE BESYLATE 10 MG PO TABS: 10.0000 mg | ORAL_TABLET | Freq: Every day | ORAL | 1 refills | Status: DC

## 2018-07-05 MED ORDER — ATORVASTATIN CALCIUM 20 MG PO TABS: ORAL_TABLET | ORAL | 1 refills | Status: DC

## 2018-07-05 MED ORDER — PANTOPRAZOLE SODIUM 40 MG PO TBEC: 40.0000 mg | DELAYED_RELEASE_TABLET | Freq: Every day | ORAL | 1 refills | Status: DC

## 2018-07-05 MED ORDER — FUROSEMIDE 40 MG PO TABS: 40.0000 mg | ORAL_TABLET | Freq: Every day | ORAL | 1 refills | Status: DC

## 2018-07-05 MED ORDER — CARVEDILOL 12.5 MG PO TABS: 12.5000 mg | ORAL_TABLET | Freq: Two times a day (BID) | ORAL | 1 refills | Status: DC

## 2018-07-05 MED ORDER — ACCU-CHEK AVIVA VI SOLN: 2 refills | Status: DC

## 2018-07-05 MED ORDER — ACCU-CHEK AVIVA PLUS W/DEVICE KIT: PACK | 0 refills | Status: DC

## 2018-07-08 ENCOUNTER — Other Ambulatory Visit: Payer: Self-pay

## 2018-07-08 DIAGNOSIS — E1122 Type 2 diabetes mellitus with diabetic chronic kidney disease: Secondary | ICD-10-CM

## 2018-07-08 DIAGNOSIS — I10 Essential (primary) hypertension: Secondary | ICD-10-CM

## 2018-07-08 MED ORDER — LOSARTAN POTASSIUM 100 MG PO TABS: 100.0000 mg | ORAL_TABLET | Freq: Every day | ORAL | 1 refills | Status: DC

## 2018-07-08 MED ORDER — METFORMIN HCL 500 MG PO TABS: 500.0000 mg | ORAL_TABLET | Freq: Two times a day (BID) | ORAL | 1 refills | Status: DC

## 2018-07-08 MED ORDER — EMPAGLIFLOZIN 10 MG PO TABS: 10.0000 mg | ORAL_TABLET | Freq: Every day | ORAL | 1 refills | Status: DC

## 2018-07-12 ENCOUNTER — Other Ambulatory Visit: Payer: Self-pay | Admitting: *Deleted

## 2018-07-12 ENCOUNTER — Other Ambulatory Visit: Payer: Self-pay | Admitting: Interventional Cardiology

## 2018-07-12 MED ORDER — GLUCOSE BLOOD VI STRP: 1.0000 | ORAL_STRIP | 3 refills | Status: DC | PRN

## 2018-07-12 MED ORDER — ACCU-CHEK SOFTCLIX LANCETS MISC: 3 refills | Status: DC

## 2018-07-12 MED ORDER — BD SWAB SINGLE USE REGULAR PADS: MEDICATED_PAD | 3 refills | Status: DC

## 2018-07-12 NOTE — Telephone Encounter (Signed)
[facility] 

## 2018-07-16 DIAGNOSIS — J9611 Chronic respiratory failure with hypoxia: Secondary | ICD-10-CM | POA: Diagnosis not present

## 2018-07-16 DIAGNOSIS — I2699 Other pulmonary embolism without acute cor pulmonale: Secondary | ICD-10-CM | POA: Diagnosis not present

## 2018-07-22 DIAGNOSIS — G4733 Obstructive sleep apnea (adult) (pediatric): Secondary | ICD-10-CM | POA: Diagnosis not present

## 2018-07-27 DIAGNOSIS — G4733 Obstructive sleep apnea (adult) (pediatric): Secondary | ICD-10-CM | POA: Diagnosis not present

## 2018-07-28 DIAGNOSIS — I2699 Other pulmonary embolism without acute cor pulmonale: Secondary | ICD-10-CM | POA: Diagnosis not present

## 2018-07-28 DIAGNOSIS — J9611 Chronic respiratory failure with hypoxia: Secondary | ICD-10-CM | POA: Diagnosis not present

## 2018-08-04 ENCOUNTER — Other Ambulatory Visit: Payer: Self-pay | Admitting: *Deleted

## 2018-08-04 ENCOUNTER — Other Ambulatory Visit: Payer: Self-pay | Admitting: Nurse Practitioner

## 2018-08-04 DIAGNOSIS — M170 Bilateral primary osteoarthritis of knee: Secondary | ICD-10-CM

## 2018-08-04 MED ORDER — TRAMADOL HCL 50 MG PO TABS: 50.0000 mg | ORAL_TABLET | Freq: Four times a day (QID) | ORAL | 0 refills | Status: DC | PRN

## 2018-08-04 MED FILL — traMADol HCL 50 MG TABS: 50 | 8 days supply | Qty: 30 | Fill #0

## 2018-08-04 NOTE — Telephone Encounter (Signed)
Patient last appointment 04/14/2018 and next appointment 08/18/2018. Verified in [place] data base in last refill was  06/28/18 for 30 tabs

## 2018-08-04 NOTE — Telephone Encounter (Signed)
Patient requested refill. [place] Verified LR: 06/28/2018 Pended Rx and sent to [facility] for approval.

## 2018-08-11 DIAGNOSIS — N471 Phimosis: Secondary | ICD-10-CM | POA: Diagnosis not present

## 2018-08-11 DIAGNOSIS — N320 Bladder-neck obstruction: Secondary | ICD-10-CM | POA: Diagnosis not present

## 2018-08-11 DIAGNOSIS — R339 Retention of urine, unspecified: Secondary | ICD-10-CM | POA: Diagnosis not present

## 2018-08-11 DIAGNOSIS — C61 Malignant neoplasm of prostate: Secondary | ICD-10-CM | POA: Diagnosis not present

## 2018-08-15 DIAGNOSIS — R3914 Feeling of incomplete bladder emptying: Secondary | ICD-10-CM | POA: Diagnosis not present

## 2018-08-15 DIAGNOSIS — N320 Bladder-neck obstruction: Secondary | ICD-10-CM | POA: Diagnosis not present

## 2018-08-15 DIAGNOSIS — R339 Retention of urine, unspecified: Secondary | ICD-10-CM | POA: Diagnosis not present

## 2018-08-16 ENCOUNTER — Other Ambulatory Visit: Payer: Self-pay | Admitting: Nurse Practitioner

## 2018-08-16 ENCOUNTER — Other Ambulatory Visit: Payer: Self-pay

## 2018-08-16 ENCOUNTER — Other Ambulatory Visit: Payer: Medicare HMO

## 2018-08-16 DIAGNOSIS — E1122 Type 2 diabetes mellitus with diabetic chronic kidney disease: Secondary | ICD-10-CM | POA: Diagnosis not present

## 2018-08-16 DIAGNOSIS — E785 Hyperlipidemia, unspecified: Secondary | ICD-10-CM

## 2018-08-16 DIAGNOSIS — J9611 Chronic respiratory failure with hypoxia: Secondary | ICD-10-CM | POA: Diagnosis not present

## 2018-08-16 DIAGNOSIS — I2699 Other pulmonary embolism without acute cor pulmonale: Secondary | ICD-10-CM | POA: Diagnosis not present

## 2018-08-17 LAB — COMPLETE METABOLIC PANEL WITH GFR
AG Ratio: 1.3 (calc) (ref 1.0–2.5)
ALT: 11 U/L (ref 9–46)
AST: 12 U/L (ref 10–35)
Albumin: 3.9 g/dL (ref 3.6–5.1)
Alkaline phosphatase (APISO): 78 U/L (ref 35–144)
BUN: 15 mg/dL (ref 7–25)
CO2: 28 mmol/L (ref 20–32)
Calcium: 8.9 mg/dL (ref 8.6–10.3)
Chloride: 107 mmol/L (ref 98–110)
Creat: 1.04 mg/dL (ref 0.70–1.25)
GFR, Est African American: 85 mL/min/{1.73_m2} (ref >=60)
GFR, Est Non African American: 73 mL/min/{1.73_m2} (ref >=60)
Globulin: 3 g/dL (calc) (ref 1.9–3.7)
Glucose, Bld: 194 mg/dL — ABNORMAL HIGH (ref 65–99)
Potassium: 3.6 mmol/L (ref 3.5–5.3)
Sodium: 146 mmol/L (ref 135–146)
Total Bilirubin: 0.6 mg/dL (ref 0.2–1.2)
Total Protein: 6.9 g/dL (ref 6.1–8.1)

## 2018-08-17 LAB — LIPID PANEL
Cholesterol: 148 mg/dL (ref <200)
HDL: 51 mg/dL (ref >=40)
LDL Cholesterol (Calc): 83 mg/dL (calc)
Non-HDL Cholesterol (Calc): 97 mg/dL (calc) (ref <130)
Total CHOL/HDL Ratio: 2.9 (calc) (ref <5.0)
Triglycerides: 68 mg/dL (ref <150)

## 2018-08-17 LAB — HEMOGLOBIN A1C
Hgb A1c MFr Bld: 7.9 % of total Hgb — ABNORMAL HIGH (ref <5.7)
Mean Plasma Glucose: 180 (calc)
eAG (mmol/L): 10 (calc)

## 2018-08-18 ENCOUNTER — Encounter: Payer: Self-pay | Admitting: Nurse Practitioner

## 2018-08-18 ENCOUNTER — Ambulatory Visit (INDEPENDENT_AMBULATORY_CARE_PROVIDER_SITE_OTHER): Payer: Medicare HMO | Admitting: Nurse Practitioner

## 2018-08-18 ENCOUNTER — Other Ambulatory Visit: Payer: Self-pay

## 2018-08-18 DIAGNOSIS — E785 Hyperlipidemia, unspecified: Secondary | ICD-10-CM

## 2018-08-18 DIAGNOSIS — Z1212 Encounter for screening for malignant neoplasm of rectum: Secondary | ICD-10-CM

## 2018-08-18 DIAGNOSIS — I10 Essential (primary) hypertension: Secondary | ICD-10-CM

## 2018-08-18 DIAGNOSIS — M170 Bilateral primary osteoarthritis of knee: Secondary | ICD-10-CM

## 2018-08-18 DIAGNOSIS — G4733 Obstructive sleep apnea (adult) (pediatric): Secondary | ICD-10-CM | POA: Diagnosis not present

## 2018-08-18 DIAGNOSIS — N183 Chronic kidney disease, stage 3 unspecified: Secondary | ICD-10-CM

## 2018-08-18 DIAGNOSIS — I5022 Chronic systolic (congestive) heart failure: Secondary | ICD-10-CM | POA: Diagnosis not present

## 2018-08-18 DIAGNOSIS — Z1211 Encounter for screening for malignant neoplasm of colon: Secondary | ICD-10-CM

## 2018-08-18 DIAGNOSIS — I480 Paroxysmal atrial fibrillation: Secondary | ICD-10-CM | POA: Diagnosis not present

## 2018-08-18 DIAGNOSIS — E1122 Type 2 diabetes mellitus with diabetic chronic kidney disease: Secondary | ICD-10-CM

## 2018-08-18 DIAGNOSIS — Z8546 Personal history of malignant neoplasm of prostate: Secondary | ICD-10-CM | POA: Diagnosis not present

## 2018-08-18 NOTE — Progress Notes (Signed)
This service is provided via telemedicine  No vital signs collected/recorded due to the encounter was a telemedicine visit.   Location of patient (ex: home, work):  Home  Patient consents to a telephone visit:  Yes  Location of the provider (ex: office, home): [employer], Office    Names of all persons participating in the telemedicine service and their role in the encounter:  Patient and Sherrie Mustache, NP  Time spent on call:  5 mins     Careteam: Patient Care Team: [name], NP as PCP - General ([place]) Jettie Booze, MD as PCP - Cardiology (Cardiology) Katy Fitch, Darlina Guys, MD as Consulting Physician (Ophthalmology)  Advanced Directive information Does Patient Have a Medical Advance Directive?: No, Would patient like information on creating a medical advance directive?: Yes (MAU/Ambulatory/Procedural Areas - Information given)(Paperwork given from a previous visit)  Allergies  Allergen Reactions  . Lisinopril Cough    Chief Complaint  Patient presents with  . Medical Management of Chronic Issues    4 month follow-up and discuss labs  . Medication Management    Discuss metformin  . Foot Concerns    Patient question if its ok to soak feet in [place] foot soak     HPI: Patient is a 68 y.o. male for routine follow up.   DM- a1c up to 7.9, he was aware blood sugar had gotten worse but had now made changes to help. Continues on metformin 500 mg by mouth twice daily, glipizide daily, and jardiane 10 mg by mouth daly. Has yearly eye exams.   htn- currently on norvasc 10 mg, coreg 12.5 mg BID, lasix 40 mg daily, losartan 100 mg daily, hydralazine 25 mg TID. Elevated at last appt but had eaten high sodium food prior. Plans to work on this.   Hx of prostate cancer s/p TURP- Went to urologist last week, PSA was followed up at 0.28. follow up in 6 months.   Hyperlipidemia- continues on lipitor 20 mg daily   GERD-  controlled on protonix 40 mg daily  Hx DVT/PE- continues to have DVT in left leg. Continues on xarelto 20 mg by mouth daily   OA- Ongoing pain in knees, uses tramadol PRN, Replacement in right knee and reconstructive surgery to left. Still following with ortho and working on them.   OSA- using cpap every night.   Obesity- has been gaining weight since COVID, has made changes recently.   Review of Systems:  Review of Systems  Constitutional: Negative for chills, fever and malaise/fatigue.  HENT: Negative for congestion, ear discharge, ear pain, hearing loss, sinus pain, sore throat and tinnitus.   Eyes: Negative for blurred vision.  Respiratory: Negative for cough and shortness of breath.   Cardiovascular: Negative for chest pain and palpitations.  Gastrointestinal: Negative for abdominal pain, constipation, diarrhea, nausea and vomiting.  Genitourinary: Negative for dysuria and frequency.  Musculoskeletal: Positive for joint pain. Negative for myalgias.  Neurological: Positive for tingling (neuropathy). Negative for dizziness, sensory change, weakness and headaches.  Endo/Heme/Allergies: Negative for environmental allergies.  Psychiatric/Behavioral: Negative for memory loss. The patient is not nervous/anxious.     Past Medical History:  Diagnosis Date  . Acute on chronic diastolic CHF (congestive heart failure) ([place]) 06/01/2017  . Arthritis   . Cancer [facility]) 2010   Prostate  . Chronic kidney disease   . Diabetes mellitus   . Diabetic neuropathy (HCC)    feet  . DVT (deep venous thrombosis) ([place])   .  Genetic testing 06/22/2016   Mr. Varelas underwent genetic counseling and testing for hereditary cancer syndromes on 05/14/2016. His results were negative for mutations in all 46 genes analyzed by [facility]'s 46-gene Common Hereditary Cancers Panel. Genes analyzed include: APC, ATM, AXIN2, BARD1, BMPR1A, BRCA1, BRCA2, BRIP1, CDH1, CDKN2A, CHEK2, CTNNA1, DICER1, EPCAM, GREM1, HOXB13, KIT,  MEN1, MLH1, MSH2, MSH3, MSH6, MUTYH, NB  . GERD (gastroesophageal reflux disease)   . Hypertension   . PE (pulmonary thromboembolism) ([place])   . Pneumonia   . Sleep apnea    not wearing CPAP   Past Surgical History:  Procedure Laterality Date  . CARDIOVERSION N/A 06/03/2017   Procedure: CARDIOVERSION;  Surgeon: Jerline Pain, MD;  Location: [facility] ENDOSCOPY;  Service: Cardiovascular;  Laterality: N/A;  . COLONOSCOPY    . HERNIA REPAIR    . KNEE SURGERY    . MULTIPLE TOOTH EXTRACTIONS    . PROSTATE SURGERY    . RADIOACTIVE SEED IMPLANT    . RIGHT/LEFT HEART CATH AND CORONARY ANGIOGRAPHY N/A 07/06/2017   Procedure: RIGHT/LEFT HEART CATH AND CORONARY ANGIOGRAPHY;  Surgeon: Jettie Booze, MD;  Location: [place] CV LAB;  Service: Cardiovascular;  Laterality: N/A;  . SHOULDER SURGERY    . TOTAL KNEE ARTHROPLASTY Right 11/19/2016   Procedure: RIGHT TOTAL KNEE ARTHROPLASTY;  Surgeon: Leandrew Koyanagi, MD;  Location: [place];  Service: Orthopedics;  Laterality: Right;  . uretha surgery-2014     Social History:   reports that he has never smoked. He has never used smokeless tobacco. He reports that he does not drink alcohol or use drugs.  Family History  Problem Relation Age of Onset  . Breast cancer Mother 90       d.89  . Breast cancer Sister 54       d.30  . Leukemia Brother 18       d.20  . Breast cancer Maternal Aunt 40       d.40s  . Lung cancer Maternal Uncle   . Prostate cancer Paternal Uncle   . Prostate cancer Brother        recurred recently at age 59  . Other Brother 15       spinal tumor  . Cervical cancer Other 22       d.22  . Cancer Sister 62       unspecified type  . Cancer Maternal Uncle 80       unspecified type    Medications: Patient's Medications  New Prescriptions   No medications on file  Previous Medications   ACCU-CHEK SOFTCLIX LANCETS LANCETS    Use to test blood sugar daily. Dx: E11.22   ALCOHOL SWABS (B-D SINGLE USE SWABS REGULAR) PADS     Use in testing blood sugar daily. Dx: E11.22   AMBULATORY NON FORMULARY MEDICATION    Full Face CPAP Mask with Tubing Dx: G47.30   AMLODIPINE (NORVASC) 10 MG TABLET    Take 1 tablet (10 mg total) by mouth daily. For high blood pressure   ASPIRIN EC 81 MG TABLET    Take 81 mg by mouth daily.   ATORVASTATIN (LIPITOR) 20 MG TABLET    Take one tablet by mouth once daily for cholesterol   BLOOD GLUCOSE CALIBRATION (ACCU-CHEK AVIVA) SOLN    Use once daily as directed dx E11.22   BLOOD GLUCOSE MONITORING SUPPL (ACCU-CHEK AVIVA PLUS) W/DEVICE KIT    Once daily as directed dx E11.22   CARVEDILOL (COREG) 12.5 MG TABLET  Take 1 tablet (12.5 mg total) by mouth 2 (two) times daily with a meal.   EMPAGLIFLOZIN (JARDIANCE) 10 MG TABS TABLET    Take 10 mg by mouth daily.   FUROSEMIDE (LASIX) 40 MG TABLET    Take 1 tablet (40 mg total) by mouth daily.   GLIPIZIDE (GLUCOTROL) 5 MG TABLET    Take 1 tablet (5 mg total) by mouth daily before breakfast.   GLUCOSE BLOOD (ACCU-CHEK AVIVA PLUS) TEST STRIP    1 each by Other route as needed for other. Use to test blood sugar daily. Dx: E11.22   HYDRALAZINE (APRESOLINE) 25 MG TABLET    Take 1 tablet (25 mg total) by mouth 3 (three) times daily.   LOSARTAN (COZAAR) 100 MG TABLET    Take 1 tablet (100 mg total) by mouth daily.   METFORMIN (GLUCOPHAGE) 500 MG TABLET    Take 1 tablet (500 mg total) by mouth 2 (two) times daily with a meal.   PANTOPRAZOLE (PROTONIX) 40 MG TABLET    Take 1 tablet (40 mg total) by mouth daily.   POTASSIUM CHLORIDE SA (K-DUR) 20 MEQ TABLET    Take 1 tablet (20 mEq total) by mouth 2 (two) times daily.   RIVAROXABAN (XARELTO) 20 MG TABS TABLET    Take 1 tablet (20 mg total) by mouth daily with supper.   SENNA-DOCUSATE (SENOKOT S) 8.6-50 MG TABLET    Take 1 tablet by mouth at bedtime as needed.   TRAMADOL (ULTRAM) 50 MG TABLET    Take 1 tablet (50 mg total) by mouth every 6 (six) hours as needed. for pain   UNABLE TO FIND    CPAP Hose: use daily  with CPAP machine. DX G47.30  Modified Medications   No medications on file  Discontinued Medications   No medications on file    Physical Exam:  There were no vitals filed for this visit. There is no height or weight on file to calculate BMI. Wt Readings from Last 3 Encounters:  04/14/18 (!) 324 lb 3.2 oz (147.1 kg)  03/14/18 (!) 332 lb 12.8 oz (151 kg)  12/14/17 (!) 340 lb (154.2 kg)     Labs reviewed: Basic Metabolic Panel: Recent Labs    12/09/17 1142 04/14/18 0925 08/16/18 0855  NA 145 140 146  K 4.0 3.4* 3.6  CL 107 101 107  CO2 '27 26 28  '$ GLUCOSE 203* 169* 194*  BUN 11 29* 15  CREATININE 1.06 1.35* 1.04  CALCIUM 8.9 9.1 8.9   Liver Function Tests: Recent Labs    12/09/17 1142 04/14/18 0925 08/16/18 0855  AST 15 9* 12  ALT '11 14 11  '$ BILITOT 0.6 0.6 0.6  PROT 7.5 7.4 6.9   No results for input(s): LIPASE, AMYLASE in the last 8760 hours. No results for input(s): AMMONIA in the last 8760 hours. CBC: Recent Labs    12/09/17 1142 04/14/18 0925  WBC 8.2 7.8  NEUTROABS 6,224 5,834  HGB 12.8* 13.0*  HCT 40.1 40.3  MCV 80.8 81.6  PLT 342 300   Lipid Panel: Recent Labs    12/09/17 1142 04/14/18 0925 08/16/18 0855  CHOL 143 186 148  HDL 46 43 51  LDLCALC 82 121* 83  TRIG 74 109 68  CHOLHDL 3.1 4.3 2.9   TSH: No results for input(s): TSH in the last 8760 hours. A1C: Lab Results  Component Value Date   HGBA1C 7.9 (H) 08/16/2018     Assessment/Plan 1. Hyperlipidemia with target LDL less  than 70 -LDL 83, goal <70, encouraged dietary modifications with lipitor 20  2. Chronic systolic heart failure (HCC) Stable, continues on coreg BID with lasix.  3. CKD (chronic kidney disease), stage III ([place]) -stable on recent labs. Encourage proper hydration and to avoid NSAIDS (Aleve, Advil, Motrin, Ibuprofen)   4. Paroxysmal atrial fibrillation (HCC) Rate controlled. Continues on coreg and xarelto   5. Severe obesity (BMI >= 40) (HCC) -encouraged  weight loss with diet and exercise.   6. OSA (obstructive sleep apnea) -continues on CPAP  7. H/O prostate cancer -stable, ongoing follow up with urologist.   8. Primary osteoarthritis of both knees Ongoing follow up with ortho and PT. Continues to use tramadol as needed for pain.   9. Essential hypertension Elevated at urologist appt, states he had eating a high sodium meal. Will continue current medication but pt agreeable for diet changes to help blood pressure control. Goal <140/90. He will monitor at home and notify if not at goal.   10. Type 2 diabetes mellitus with chronic kidney disease, without long-term current use of insulin, unspecified CKD stage (HCC) -a1c elevated, he has not been on his dietary regimen but plans to make changes. Will continue current medications -Encouraged dietary compliance, routine foot care/monitoring and to keep up with diabetic eye exams through ophthalmology   11. Encounter for colorectal cancer screening - Ambulatory referral to Gastroenterology  Next appt: 4 month follow up with labs prior  Ebelyn Bohnet K. Harle Battiest  [facility] [phone]    Virtual Visit via Telephone Note  I connected with pt on 08/18/18 at  9:30 AM EDT by telephone and verified that I am speaking with the correct person using two identifiers.  Location: Patient: home Provider: office    I discussed the limitations, risks, security and privacy concerns of performing an evaluation and management service by telephone and the availability of in person appointments. I also discussed with the patient that there may be a patient responsible charge related to this service. The patient expressed understanding and agreed to proceed.   I discussed the assessment and treatment plan with the patient. The patient was provided an opportunity to ask questions and all were answered. The patient agreed with the plan and demonstrated an understanding of the  instructions.   The patient was advised to call back or seek an in-person evaluation if the symptoms worsen or if the condition fails to improve as anticipated.  I provided 25 minutes of non-face-to-face time during this encounter.  Carlos American. Harle Battiest Avs printed and mailed

## 2018-08-26 DIAGNOSIS — G4733 Obstructive sleep apnea (adult) (pediatric): Secondary | ICD-10-CM | POA: Diagnosis not present

## 2018-08-27 DIAGNOSIS — J9611 Chronic respiratory failure with hypoxia: Secondary | ICD-10-CM | POA: Diagnosis not present

## 2018-08-27 DIAGNOSIS — I2699 Other pulmonary embolism without acute cor pulmonale: Secondary | ICD-10-CM | POA: Diagnosis not present

## 2018-08-31 DIAGNOSIS — G4733 Obstructive sleep apnea (adult) (pediatric): Secondary | ICD-10-CM | POA: Diagnosis not present

## 2018-09-08 ENCOUNTER — Telehealth: Payer: Self-pay | Admitting: Nurse Practitioner

## 2018-09-08 MED FILL — traMADol HCL 50 MG TABS: 50 | 7 days supply | Qty: 30 | Fill #0

## 2018-09-08 NOTE — Telephone Encounter (Signed)
Patient's wife stated that they just picked up prescriptions from [facility].  She stated that they went to [place] to pick up prescriptions and was told that they were at [facility].  She wants to use [facility] for all future prescriptions.

## 2018-09-15 DIAGNOSIS — J9611 Chronic respiratory failure with hypoxia: Secondary | ICD-10-CM | POA: Diagnosis not present

## 2018-09-15 DIAGNOSIS — I2699 Other pulmonary embolism without acute cor pulmonale: Secondary | ICD-10-CM | POA: Diagnosis not present

## 2018-09-19 ENCOUNTER — Encounter: Payer: Self-pay | Admitting: Gastroenterology

## 2018-09-19 ENCOUNTER — Ambulatory Visit (INDEPENDENT_AMBULATORY_CARE_PROVIDER_SITE_OTHER): Payer: Medicare HMO | Admitting: Gastroenterology

## 2018-09-19 DIAGNOSIS — Z86010 Personal history of colonic polyps: Secondary | ICD-10-CM | POA: Diagnosis not present

## 2018-09-19 MED ORDER — NA SULFATE-K SULFATE-MG SULF 17.5-3.13-1.6 GM/177ML PO SOLN: 1.0000 | ORAL | 0 refills | Status: AC

## 2018-09-19 MED FILL — SUPREP BOWEL PREP KIT: 17.5-3.13-1 | 1 days supply | Qty: 354 | Fill #0

## 2018-09-19 NOTE — Patient Instructions (Signed)
Tips for colonoscopy:  - Stay well hydrated for 3-4 days prior to the exam. This reduces nausea and dehydration.  - To prevent skin/hemorrhoid irritation - prior to wiping, put A&Dointment or vaseline on the toilet paper. - Keep a towel or pad on the bed.  - Drink  64oz of clear liquids in the morning of prep day (prior to starting the prep) to be sure that there is enough fluid to flush the colon and stay hydrated!!!! This is in addition to the fluids required for preparation. - Use of a flavored hard candy, such as grape Anise Salvo, can counteract some of the flavor of the prep and may prevent some nausea.

## 2018-09-19 NOTE — Progress Notes (Signed)
TELEHEALTH VISIT  Referring Provider: [name], NP Primary Care Physician:  [name], NP   Tele-visit due to COVID-19 pandemic Patient requested visit virtually, consented to the virtual encounter via video enabled telemedicine application (Zoom) Contact made at: 13:30 09/19/18 Patient verified by name and date of birth Location of patient: Home Location provider: [place] [facility] Names of persons participating: Me, patient, Tinnie Gens CMA Time spent on telehealth visit: 27 minutes I discussed the limitations of evaluation and management by telemedicine. The patient expressed understanding and agreed to proceed.  Reason for Consultation: need for colon cancer screening   IMPRESSION:  Personal history of colon polyps    - prior endoscopies performed with [facility] DVT/PE on Xarelto BMI 47.88 Walks with a cane  Colonoscopy recommended. I have recommended holding Xarelto for 2 days before endoscopy.  I discussed with the patient that there is a low, but real, risk of a cardiovascular event such as heart attack, stroke, or embolism/thrombosis while off Xarelto. Will communicate by phone or EMR with patient's prescribing provider to confirm that holding the Xarelto is appropriate at this time.    PLAN: Colonoscopy after a Xarelto washout at the hospital Obtain results from [place] regarding three prior colonoscopy reports and associated pathology findings  Please see the "Patient Instructions" section for addition details about the plan.  HPI: Thomas [name] is a 68 y.o. male referred by NP [name] for colon cancer screening.  He has a history of diabetes, hypertension, prostate cancer status post TURP, hyperlipidemia, reflux controlled on Protonix, history of DVT/PE on Xarelto, osteoarthritis, obstructive sleep apnea on CPAP, and obesity with weight gain since COVID. He walks with a cane but would prefer a wheelchair to get into our office.    Three prior colonoscopies with [facility]. Review of EPIC shows a hyperplastic polyp was removed 09/20/2003. I do not have other details about the prior procedures available at this time.   There is no dysphagia, odynophagia, regurgitation,  heartburn, nausea, abdominal pain, change in bowel habits, melena, hematochezia, or bright red blood per rectum. There is no anorexia or recent change in weight.   No known family history of colon cancer or polyps. No family history of uterine/endometrial cancer, pancreatic cancer or gastric/stomach cancer. Genetic testing was negative.   Past Medical History:  Diagnosis Date  . Acute on chronic diastolic CHF (congestive heart failure) ([place]) 06/01/2017  . Arthritis   . Cancer [facility]) 2010   Prostate  . Chronic kidney disease   . Diabetes mellitus   . Diabetic neuropathy (HCC)    feet  . DVT (deep venous thrombosis) ([place])   . Genetic testing 06/22/2016   Mr. Tener underwent genetic counseling and testing for hereditary cancer syndromes on 05/14/2016. His results were negative for mutations in all 46 genes analyzed by [facility]'s 46-gene Common Hereditary Cancers Panel. Genes analyzed include: APC, ATM, AXIN2, BARD1, BMPR1A, BRCA1, BRCA2, BRIP1, CDH1, CDKN2A, CHEK2, CTNNA1, DICER1, EPCAM, GREM1, HOXB13, KIT, MEN1, MLH1, MSH2, MSH3, MSH6, MUTYH, NB  . GERD (gastroesophageal reflux disease)   . Hypertension   . PE (pulmonary thromboembolism) ([place])   . Pneumonia   . Sleep apnea    not wearing CPAP    Past Surgical History:  Procedure Laterality Date  . CARDIOVERSION N/A 06/03/2017   Procedure: CARDIOVERSION;  Surgeon: Jerline Pain, MD;  Location: [facility] ENDOSCOPY;  Service: Cardiovascular;  Laterality: N/A;  . COLONOSCOPY    . HERNIA REPAIR    .  KNEE SURGERY    . MULTIPLE TOOTH EXTRACTIONS    . PROSTATE SURGERY    . RADIOACTIVE SEED IMPLANT    . RIGHT/LEFT HEART CATH AND CORONARY ANGIOGRAPHY N/A 07/06/2017   Procedure: RIGHT/LEFT HEART CATH AND CORONARY  ANGIOGRAPHY;  Surgeon: Jettie Booze, MD;  Location: [place] CV LAB;  Service: Cardiovascular;  Laterality: N/A;  . SHOULDER SURGERY    . TOTAL KNEE ARTHROPLASTY Right 11/19/2016   Procedure: RIGHT TOTAL KNEE ARTHROPLASTY;  Surgeon: Leandrew Koyanagi, MD;  Location: [facility];  Service: Orthopedics;  Laterality: Right;  . uretha surgery-2014      Current Outpatient Medications  Medication Sig Dispense Refill  . Accu-Chek Softclix Lancets lancets Use to test blood sugar daily. Dx: E11.22 100 each 3  . Alcohol Swabs (B-D SINGLE USE SWABS REGULAR) PADS Use in testing blood sugar daily. Dx: E11.22 100 each 3  . AMBULATORY NON FORMULARY MEDICATION Full Face CPAP Mask with Tubing Dx: G47.30 1 each 0  . amLODipine (NORVASC) 10 MG tablet Take 1 tablet (10 mg total) by mouth daily. For high blood pressure 90 tablet 1  . aspirin EC 81 MG tablet Take 81 mg by mouth daily.    Marland Kitchen atorvastatin (LIPITOR) 20 MG tablet Take one tablet by mouth once daily for cholesterol 90 tablet 1  . Blood Glucose Calibration (ACCU-CHEK AVIVA) SOLN Use once daily as directed dx E11.22 1 each 2  . Blood Glucose Monitoring Suppl (ACCU-CHEK AVIVA PLUS) w/Device KIT Once daily as directed dx E11.22 1 kit 0  . carvedilol (COREG) 12.5 MG tablet Take 1 tablet (12.5 mg total) by mouth 2 (two) times daily with a meal. 180 tablet 1  . empagliflozin (JARDIANCE) 10 MG TABS tablet Take 10 mg by mouth daily. 90 tablet 1  . furosemide (LASIX) 40 MG tablet Take 1 tablet (40 mg total) by mouth daily. 90 tablet 1  . glipiZIDE (GLUCOTROL) 5 MG tablet Take 1 tablet (5 mg total) by mouth daily before breakfast. 90 tablet 1  . glucose blood (ACCU-CHEK AVIVA PLUS) test strip 1 each by Other route as needed for other. Use to test blood sugar daily. Dx: E11.22 100 each 3  . hydrALAZINE (APRESOLINE) 25 MG tablet Take 1 tablet (25 mg total) by mouth 3 (three) times daily. 270 tablet 3  . losartan (COZAAR) 100 MG tablet Take 1 tablet (100 mg total) by  mouth daily. 90 tablet 1  . metFORMIN (GLUCOPHAGE) 500 MG tablet Take 1 tablet (500 mg total) by mouth 2 (two) times daily with a meal. 180 tablet 1  . pantoprazole (PROTONIX) 40 MG tablet Take 1 tablet (40 mg total) by mouth daily. 90 tablet 1  . potassium chloride SA (K-DUR) 20 MEQ tablet Take 1 tablet (20 mEq total) by mouth 2 (two) times daily. 60 tablet 11  . rivaroxaban (XARELTO) 20 MG TABS tablet Take 1 tablet (20 mg total) by mouth daily with supper. 30 tablet 12  . senna-docusate (SENOKOT S) 8.6-50 MG tablet Take 1 tablet by mouth at bedtime as needed. 30 tablet 1  . traMADol (ULTRAM) 50 MG tablet Take 1 tablet (50 mg total) by mouth every 6 (six) hours as needed. for pain 30 tablet 0  . UNABLE TO FIND CPAP Hose: use daily with CPAP machine. DX G47.30 1 each 11   No current facility-administered medications for this visit.     Allergies as of 09/19/2018 - Review Complete 08/18/2018  Allergen Reaction Noted  . Lisinopril Cough  08/21/2016    Family History  Problem Relation Age of Onset  . Breast cancer Mother 23       d.89  . Breast cancer Sister 73       d.30  . Leukemia Brother 18       d.20  . Breast cancer Maternal Aunt 40       d.40s  . Lung cancer Maternal Uncle   . Prostate cancer Paternal Uncle   . Prostate cancer Brother        recurred recently at age 38  . Other Brother 15       spinal tumor  . Cervical cancer Other 22       d.22  . Cancer Sister 44       unspecified type  . Cancer Maternal Uncle 80       unspecified type    Social History   Socioeconomic History  . Marital status: Married    Spouse name: Not on file  . Number of children: Not on file  . Years of education: Not on file  . Highest education level: Not on file  Occupational History  . Not on file  Social Needs  . Financial resource strain: Somewhat hard  . Food insecurity    Worry: Never true    Inability: Never true  . Transportation needs    Medical: No    Non-medical: No   Tobacco Use  . Smoking status: Never Smoker  . Smokeless tobacco: Never Used  Substance and Sexual Activity  . Alcohol use: No    Comment: never  . Drug use: No  . Sexual activity: Yes  Lifestyle  . Physical activity    Days per week: 0 days    Minutes per session: 0 min  . Stress: Only a little  Relationships  . Social connections    Talks on phone: More than three times a week    Gets together: More than three times a week    Attends religious service: Never    Active member of club or organization: No    Attends meetings of clubs or organizations: Never    Relationship status: Married  . Intimate partner violence    Fear of current or ex partner: No    Emotionally abused: No    Physically abused: No    Forced sexual activity: No  Other Topics Concern  . Not on file  Social History Narrative  . Not on file    Review of Systems: ALL ROS discussed and all others negative except listed in HPI.  Physical Exam: Complete physical exam not performed due to the limits inherent in a telehealth encounter.  General: Awake, alert, and oriented, and well communicative. In no acute distress.  HEENT: EOMI, non-icteric sclera, NCAT, MMM  Neck: Normal movement of head and neck  Pulm: No labored breathing, speaking in full sentences without conversational dyspnea  Derm: No apparent lesions or bruising in visible field  MS: Moves all visible extremities without noticeable abnormality  Psych: Pleasant, cooperative, normal speech, normal affect and normal insight Neuro: Alert and appropriate   Harmoni Lucus L. Tarri Glenn, MD, MPH [facility] 09/19/2018, 1:31 PM

## 2018-09-20 ENCOUNTER — Telehealth: Payer: Self-pay | Admitting: Emergency Medicine

## 2018-09-20 NOTE — Telephone Encounter (Signed)
Pharm please address [email or web address] thanks 

## 2018-09-20 NOTE — Telephone Encounter (Signed)
   Proberta, MD  Chart reviewed as part of pre-operative protocol coverage. Because of Thomas [name]'s past medical history and time since last visit, he/she will require a follow-up visit in order to better assess preoperative cardiovascular risk.  Pre-op covering staff: - Please schedule appointment and call patient to inform them. - Please contact requesting surgeon's office via preferred method (i.e, phone, fax) to inform them of need for appointment prior to surgery.  If applicable, this message will also be routed to pharmacy pool and/or primary cardiologist for input on holding anticoagulant/antiplatelet agent as requested below so that this information is available at time of patient's appointment.   Cecilie Kicks, NP  09/20/2018, 11:17 AM

## 2018-09-20 NOTE — Telephone Encounter (Signed)
Patient with diagnosis of Atrial Fibrillation on Xarelto for anticoagulation.    Procedure: colonoscopy Date of procedure: 10/11/2018  CHADS2-VASc score of  4 (CHF, HTN, AGE, DM2)  CrCl 68 (used IBW) Platelet count 300  Per office protocol, patient can hold Xarelto for 2 days prior to procedure.    Of note, patient also has history of PE/DVT, but this was prior to developing AF, was already on Xarelto at time of AF diagnosis

## 2018-09-20 NOTE — Telephone Encounter (Signed)
Pt has been scheduled to see Ellen Henri, PA-C, for surgical clearance, 10/04/2018.  Due to pt's wifes dialysis, he can only come on Tuesdays or Thursdays.  Will route back to [facility] to make them aware.

## 2018-09-20 NOTE — Telephone Encounter (Signed)
[facility] Pre-operative Risk Assessment     Request for surgical clearance:     Endoscopy Procedure  What type of surgery is being performed?     colonoscopy  When is this surgery scheduled?     10/11/2018  What type of clearance is required ?   Pharmacy  Are there any medications that need to be held prior to surgery and how long? Xarelto 2 days  Practice name and name of physician performing surgery?      [facility]  What is your office phone and fax number?      Phone- [phone]  [phone]  Anesthesia type (None, local, MAC, general) ?       MAC

## 2018-09-26 DIAGNOSIS — G4733 Obstructive sleep apnea (adult) (pediatric): Secondary | ICD-10-CM | POA: Diagnosis not present

## 2018-09-27 DIAGNOSIS — I2699 Other pulmonary embolism without acute cor pulmonale: Secondary | ICD-10-CM | POA: Diagnosis not present

## 2018-09-27 DIAGNOSIS — J9611 Chronic respiratory failure with hypoxia: Secondary | ICD-10-CM | POA: Diagnosis not present

## 2018-10-03 NOTE — Telephone Encounter (Signed)
Spoke with patient and informed him to hold Xarelto 2 days before procedure. Wife states that they were scheduled for an appointment but she does not know why. I informed her that we did receive clearance so I am not sure why the follow up was scheduled.

## 2018-10-04 ENCOUNTER — Ambulatory Visit: Payer: Medicare HMO | Admitting: Cardiology

## 2018-10-13 ENCOUNTER — Other Ambulatory Visit: Payer: Self-pay | Admitting: Nurse Practitioner

## 2018-10-13 DIAGNOSIS — M170 Bilateral primary osteoarthritis of knee: Secondary | ICD-10-CM

## 2018-10-13 MED FILL — traMADol HCL 50 MG TABS: 50 | 8 days supply | Qty: 30 | Fill #0

## 2018-10-13 NOTE — Telephone Encounter (Signed)
Last filled in Epic on 08/04/2018, RX request sent to [name], NP for review of  Database and approval if necessary

## 2018-10-14 MED FILL — SUPREP BOWEL PREP KIT: 17.5-3.13-1 | 1 days supply | Qty: 354 | Fill #0

## 2018-10-16 DIAGNOSIS — J9611 Chronic respiratory failure with hypoxia: Secondary | ICD-10-CM | POA: Diagnosis not present

## 2018-10-16 DIAGNOSIS — I2699 Other pulmonary embolism without acute cor pulmonale: Secondary | ICD-10-CM | POA: Diagnosis not present

## 2018-10-17 ENCOUNTER — Other Ambulatory Visit (HOSPITAL_COMMUNITY)
Admission: RE | Admit: 2018-10-17 | Discharge: 2018-10-17 | Disposition: A | Discharge status: Home / Self Care | Payer: Medicare HMO | Source: Ambulatory Visit | Attending: Gastroenterology | Admitting: Gastroenterology

## 2018-10-17 DIAGNOSIS — I25119 Atherosclerotic heart disease of native coronary artery with unspecified angina pectoris: Secondary | ICD-10-CM | POA: Diagnosis not present

## 2018-10-17 DIAGNOSIS — Z86711 Personal history of pulmonary embolism: Secondary | ICD-10-CM | POA: Diagnosis not present

## 2018-10-17 DIAGNOSIS — Z7982 Long term (current) use of aspirin: Secondary | ICD-10-CM | POA: Diagnosis not present

## 2018-10-17 DIAGNOSIS — E669 Obesity, unspecified: Secondary | ICD-10-CM | POA: Diagnosis not present

## 2018-10-17 DIAGNOSIS — K648 Other hemorrhoids: Secondary | ICD-10-CM | POA: Diagnosis not present

## 2018-10-17 DIAGNOSIS — K644 Residual hemorrhoidal skin tags: Secondary | ICD-10-CM | POA: Diagnosis not present

## 2018-10-17 DIAGNOSIS — D122 Benign neoplasm of ascending colon: Secondary | ICD-10-CM | POA: Diagnosis not present

## 2018-10-17 DIAGNOSIS — I5032 Chronic diastolic (congestive) heart failure: Secondary | ICD-10-CM | POA: Diagnosis not present

## 2018-10-17 DIAGNOSIS — Z6841 Body Mass Index (BMI) 40.0 and over, adult: Secondary | ICD-10-CM | POA: Diagnosis not present

## 2018-10-17 DIAGNOSIS — I272 Pulmonary hypertension, unspecified: Secondary | ICD-10-CM | POA: Diagnosis not present

## 2018-10-17 DIAGNOSIS — E1122 Type 2 diabetes mellitus with diabetic chronic kidney disease: Secondary | ICD-10-CM | POA: Diagnosis not present

## 2018-10-17 DIAGNOSIS — G4733 Obstructive sleep apnea (adult) (pediatric): Secondary | ICD-10-CM | POA: Diagnosis not present

## 2018-10-17 DIAGNOSIS — Z7984 Long term (current) use of oral hypoglycemic drugs: Secondary | ICD-10-CM | POA: Diagnosis not present

## 2018-10-17 DIAGNOSIS — E1140 Type 2 diabetes mellitus with diabetic neuropathy, unspecified: Secondary | ICD-10-CM | POA: Diagnosis not present

## 2018-10-17 DIAGNOSIS — Z79899 Other long term (current) drug therapy: Secondary | ICD-10-CM | POA: Diagnosis not present

## 2018-10-17 DIAGNOSIS — Z7901 Long term (current) use of anticoagulants: Secondary | ICD-10-CM | POA: Diagnosis not present

## 2018-10-17 DIAGNOSIS — K219 Gastro-esophageal reflux disease without esophagitis: Secondary | ICD-10-CM | POA: Diagnosis not present

## 2018-10-17 DIAGNOSIS — Z8719 Personal history of other diseases of the digestive system: Secondary | ICD-10-CM | POA: Diagnosis not present

## 2018-10-17 DIAGNOSIS — N189 Chronic kidney disease, unspecified: Secondary | ICD-10-CM | POA: Diagnosis not present

## 2018-10-17 DIAGNOSIS — Z86718 Personal history of other venous thrombosis and embolism: Secondary | ICD-10-CM | POA: Diagnosis not present

## 2018-10-17 DIAGNOSIS — E785 Hyperlipidemia, unspecified: Secondary | ICD-10-CM | POA: Diagnosis not present

## 2018-10-17 DIAGNOSIS — Z1211 Encounter for screening for malignant neoplasm of colon: Secondary | ICD-10-CM | POA: Diagnosis not present

## 2018-10-17 DIAGNOSIS — I130 Hypertensive heart and chronic kidney disease with heart failure and stage 1 through stage 4 chronic kidney disease, or unspecified chronic kidney disease: Secondary | ICD-10-CM | POA: Diagnosis not present

## 2018-10-17 DIAGNOSIS — Z96651 Presence of right artificial knee joint: Secondary | ICD-10-CM | POA: Diagnosis not present

## 2018-10-17 NOTE — Progress Notes (Signed)
Patient contacted about COVID test.  Patient was not aware this his procedure had been moved to 9/3.  Patient will be able to make it today for his COVID testing.

## 2018-10-18 LAB — SARS CORONAVIRUS 2 (TAT 6-24 HRS): SARS Coronavirus 2: NEGATIVE

## 2018-10-19 ENCOUNTER — Other Ambulatory Visit: Payer: Self-pay

## 2018-10-19 ENCOUNTER — Encounter (HOSPITAL_COMMUNITY): Payer: Self-pay | Admitting: *Deleted

## 2018-10-20 ENCOUNTER — Other Ambulatory Visit: Payer: Self-pay

## 2018-10-20 ENCOUNTER — Encounter (HOSPITAL_COMMUNITY): Payer: Self-pay | Admitting: Emergency Medicine

## 2018-10-20 ENCOUNTER — Ambulatory Visit (HOSPITAL_COMMUNITY): Payer: Medicare HMO | Admitting: Anesthesiology

## 2018-10-20 ENCOUNTER — Ambulatory Visit (HOSPITAL_COMMUNITY)
Admission: RE | Admit: 2018-10-20 | Discharge: 2018-10-20 | Disposition: A | Discharge status: Home / Self Care | Payer: Medicare HMO | Attending: Gastroenterology | Admitting: Gastroenterology

## 2018-10-20 ENCOUNTER — Encounter (HOSPITAL_COMMUNITY)
Admission: RE | Disposition: A | Discharge status: Home / Self Care | Payer: Self-pay | Source: Home / Self Care | Attending: Gastroenterology

## 2018-10-20 DIAGNOSIS — I25119 Atherosclerotic heart disease of native coronary artery with unspecified angina pectoris: Secondary | ICD-10-CM | POA: Insufficient documentation

## 2018-10-20 DIAGNOSIS — I272 Pulmonary hypertension, unspecified: Secondary | ICD-10-CM | POA: Insufficient documentation

## 2018-10-20 DIAGNOSIS — Z8546 Personal history of malignant neoplasm of prostate: Secondary | ICD-10-CM | POA: Insufficient documentation

## 2018-10-20 DIAGNOSIS — Z86010 Personal history of colonic polyps: Secondary | ICD-10-CM | POA: Diagnosis not present

## 2018-10-20 DIAGNOSIS — E1140 Type 2 diabetes mellitus with diabetic neuropathy, unspecified: Secondary | ICD-10-CM | POA: Insufficient documentation

## 2018-10-20 DIAGNOSIS — K648 Other hemorrhoids: Secondary | ICD-10-CM | POA: Insufficient documentation

## 2018-10-20 DIAGNOSIS — I5032 Chronic diastolic (congestive) heart failure: Secondary | ICD-10-CM | POA: Insufficient documentation

## 2018-10-20 DIAGNOSIS — Z7901 Long term (current) use of anticoagulants: Secondary | ICD-10-CM | POA: Insufficient documentation

## 2018-10-20 DIAGNOSIS — Z8719 Personal history of other diseases of the digestive system: Secondary | ICD-10-CM | POA: Diagnosis not present

## 2018-10-20 DIAGNOSIS — I130 Hypertensive heart and chronic kidney disease with heart failure and stage 1 through stage 4 chronic kidney disease, or unspecified chronic kidney disease: Secondary | ICD-10-CM | POA: Insufficient documentation

## 2018-10-20 DIAGNOSIS — K219 Gastro-esophageal reflux disease without esophagitis: Secondary | ICD-10-CM | POA: Insufficient documentation

## 2018-10-20 DIAGNOSIS — K635 Polyp of colon: Secondary | ICD-10-CM | POA: Diagnosis not present

## 2018-10-20 DIAGNOSIS — Z7982 Long term (current) use of aspirin: Secondary | ICD-10-CM | POA: Insufficient documentation

## 2018-10-20 DIAGNOSIS — G4733 Obstructive sleep apnea (adult) (pediatric): Secondary | ICD-10-CM | POA: Diagnosis not present

## 2018-10-20 DIAGNOSIS — Z86711 Personal history of pulmonary embolism: Secondary | ICD-10-CM | POA: Insufficient documentation

## 2018-10-20 DIAGNOSIS — E785 Hyperlipidemia, unspecified: Secondary | ICD-10-CM | POA: Insufficient documentation

## 2018-10-20 DIAGNOSIS — Z86718 Personal history of other venous thrombosis and embolism: Secondary | ICD-10-CM | POA: Insufficient documentation

## 2018-10-20 DIAGNOSIS — Z79899 Other long term (current) drug therapy: Secondary | ICD-10-CM | POA: Insufficient documentation

## 2018-10-20 DIAGNOSIS — N189 Chronic kidney disease, unspecified: Secondary | ICD-10-CM | POA: Insufficient documentation

## 2018-10-20 DIAGNOSIS — Z6841 Body Mass Index (BMI) 40.0 and over, adult: Secondary | ICD-10-CM | POA: Insufficient documentation

## 2018-10-20 DIAGNOSIS — E669 Obesity, unspecified: Secondary | ICD-10-CM | POA: Diagnosis not present

## 2018-10-20 DIAGNOSIS — K644 Residual hemorrhoidal skin tags: Secondary | ICD-10-CM | POA: Insufficient documentation

## 2018-10-20 DIAGNOSIS — E1122 Type 2 diabetes mellitus with diabetic chronic kidney disease: Secondary | ICD-10-CM | POA: Insufficient documentation

## 2018-10-20 DIAGNOSIS — Z7984 Long term (current) use of oral hypoglycemic drugs: Secondary | ICD-10-CM | POA: Insufficient documentation

## 2018-10-20 DIAGNOSIS — Z1211 Encounter for screening for malignant neoplasm of colon: Secondary | ICD-10-CM | POA: Diagnosis not present

## 2018-10-20 DIAGNOSIS — D122 Benign neoplasm of ascending colon: Secondary | ICD-10-CM | POA: Insufficient documentation

## 2018-10-20 DIAGNOSIS — Z96651 Presence of right artificial knee joint: Secondary | ICD-10-CM | POA: Insufficient documentation

## 2018-10-20 HISTORY — PX: POLYPECTOMY: SHX5525

## 2018-10-20 HISTORY — PX: COLONOSCOPY WITH PROPOFOL: SHX5780

## 2018-10-20 LAB — GLUCOSE, CAPILLARY: Glucose-Capillary: 180 mg/dL — ABNORMAL HIGH (ref 70–99)

## 2018-10-20 SURGERY — COLONOSCOPY WITH PROPOFOL
Anesthesia: Monitor Anesthesia Care

## 2018-10-20 MED ORDER — PROPOFOL 500 MG/50ML IV EMUL
INTRAVENOUS | Status: DC | PRN
  Administered 2018-10-20: 150 ug/kg/min via INTRAVENOUS

## 2018-10-20 MED ORDER — PROPOFOL 10 MG/ML IV BOLUS
INTRAVENOUS | Status: AC
  Filled 2018-10-20: qty 60

## 2018-10-20 MED ORDER — PROPOFOL 10 MG/ML IV BOLUS
INTRAVENOUS | Status: AC
  Filled 2018-10-20: qty 20

## 2018-10-20 MED ORDER — PROPOFOL 10 MG/ML IV BOLUS
INTRAVENOUS | Status: DC | PRN
  Administered 2018-10-20: 10 mg via INTRAVENOUS
  Administered 2018-10-20: 50 mg via INTRAVENOUS

## 2018-10-20 MED ORDER — LACTATED RINGERS IV SOLN
INTRAVENOUS | Status: DC
  Administered 2018-10-20: 1000 mL via INTRAVENOUS

## 2018-10-20 MED ORDER — SODIUM CHLORIDE 0.9 % IV SOLN: INTRAVENOUS | Status: DC

## 2018-10-20 SURGICAL SUPPLY — 22 items

## 2018-10-20 NOTE — Anesthesia Postprocedure Evaluation (Signed)
Anesthesia Post Note  Patient: Thomas [name]  Procedure(s) Performed: COLONOSCOPY WITH PROPOFOL (N/A ) POLYPECTOMY     Patient location during evaluation: Endoscopy Anesthesia Type: MAC Level of consciousness: awake and alert, oriented and patient cooperative Pain management: pain level controlled Vital Signs Assessment: post-procedure vital signs reviewed and stable Respiratory status: spontaneous breathing, nonlabored ventilation and respiratory function stable Cardiovascular status: blood pressure returned to baseline and stable Postop Assessment: no apparent nausea or vomiting Anesthetic complications: no    Last Vitals:  Vitals:   10/20/18 0920 10/20/18 0930  BP: (!) 163/69 (!) 177/77  Pulse: 61 65  Resp: 20 (!) 25  Temp: 36.6 C   SpO2: 99% 94%    Last Pain:  Vitals:   10/20/18 0920  TempSrc: Temporal  PainSc: 0-No pain                 Alexander Mcauley,E. Amyra Vantuyl

## 2018-10-20 NOTE — Anesthesia Procedure Notes (Signed)
Procedure Name: MAC Date/Time: 10/20/2018 8:45 AM Performed by: Deliah Boston, CRNA Pre-anesthesia Checklist: Patient identified, Emergency Drugs available, Suction available and Patient being monitored Patient Re-evaluated:Patient Re-evaluated prior to induction Oxygen Delivery Method: Simple face mask Preoxygenation: Pre-oxygenation with 100% oxygen Induction Type: IV induction Placement Confirmation: positive ETCO2 and breath sounds checked- equal and bilateral

## 2018-10-20 NOTE — H&P (Signed)
Referring Provider: No ref. provider found Primary Care Physician:  [name], NP  Reason for Consultation: need for colon cancer screening   IMPRESSION:  Personal history of colon polyps    - prior endoscopies performed with [facility] DVT/PE on Xarelto BMI 47.88 Walks with a cane  Colonoscopy recommended. He has completed a Xarelto wash-out.   PLAN: Colonoscopy today    HPI: Thomas [name] is a 68 y.o. male referred by NP [name] for colon cancer screening.  He has a history of diabetes, hypertension, prostate cancer status post TURP, hyperlipidemia, reflux controlled on Protonix, history of DVT/PE on Xarelto, osteoarthritis, obstructive sleep apnea on CPAP, and obesity with weight gain since COVID. He walks with a cane but would prefer a wheelchair to get into our office.   Three prior colonoscopies with [facility]. Review of EPIC shows a hyperplastic polyp was removed 09/20/2003. I do not have other details about the prior procedures available at this time.   There is no dysphagia, odynophagia, regurgitation,  heartburn, nausea, abdominal pain, change in bowel habits, melena, hematochezia, or bright red blood per rectum. There is no anorexia or recent change in weight.   No known family history of colon cancer or polyps. No family history of uterine/endometrial cancer, pancreatic cancer or gastric/stomach cancer. Genetic testing was negative.   Past Medical History:  Diagnosis Date  . Acute on chronic diastolic CHF (congestive heart failure) ([place]) 06/01/2017  . Arthritis   . Cancer [facility]) 2010   Prostate  . Chronic kidney disease   . Diabetes mellitus   . Diabetic neuropathy (HCC)    feet  . DVT (deep venous thrombosis) ([place])   . Genetic testing 06/22/2016   Mr. Weiand underwent genetic counseling and testing for hereditary cancer syndromes on 05/14/2016. His results were negative for mutations in all 46 genes analyzed by [facility]'s 46-gene Common Hereditary  Cancers Panel. Genes analyzed include: APC, ATM, AXIN2, BARD1, BMPR1A, BRCA1, BRCA2, BRIP1, CDH1, CDKN2A, CHEK2, CTNNA1, DICER1, EPCAM, GREM1, HOXB13, KIT, MEN1, MLH1, MSH2, MSH3, MSH6, MUTYH, NB  . GERD (gastroesophageal reflux disease)   . Hypertension   . PE (pulmonary thromboembolism) ([place])   . Pneumonia   . Sleep apnea    not wearing CPAP    Past Surgical History:  Procedure Laterality Date  . CARDIOVERSION N/A 06/03/2017   Procedure: CARDIOVERSION;  Surgeon: Jerline Pain, MD;  Location: [facility] ENDOSCOPY;  Service: Cardiovascular;  Laterality: N/A;  . COLONOSCOPY    . HERNIA REPAIR    . KNEE SURGERY    . MULTIPLE TOOTH EXTRACTIONS    . PROSTATE SURGERY    . RADIOACTIVE SEED IMPLANT    . RIGHT/LEFT HEART CATH AND CORONARY ANGIOGRAPHY N/A 07/06/2017   Procedure: RIGHT/LEFT HEART CATH AND CORONARY ANGIOGRAPHY;  Surgeon: Jettie Booze, MD;  Location: [place] CV LAB;  Service: Cardiovascular;  Laterality: N/A;  . SHOULDER SURGERY    . TOTAL KNEE ARTHROPLASTY Right 11/19/2016   Procedure: RIGHT TOTAL KNEE ARTHROPLASTY;  Surgeon: Leandrew Koyanagi, MD;  Location: [facility];  Service: Orthopedics;  Laterality: Right;  . uretha surgery-2014      Current Facility-Administered Medications  Medication Dose Route Frequency Provider Last Rate Last Dose  . 0.9 %  sodium chloride infusion   Intravenous Continuous [place] Park, MD      . lactated ringers infusion   Intravenous Continuous [place] Park, MD 10 mL/hr at 10/20/18 0804 1,000 mL at 10/20/18 0804    Allergies  as of 09/19/2018 - Review Complete 09/19/2018  Allergen Reaction Noted  . Lisinopril Cough 08/21/2016    Family History  Problem Relation Age of Onset  . Breast cancer Mother 29       d.89  . Breast cancer Sister 17       d.30  . Leukemia Brother 18       d.20  . Breast cancer Maternal Aunt 40       d.40s  . Lung cancer Maternal Uncle   . Prostate cancer Paternal Uncle   . Prostate cancer Brother         recurred recently at age 62  . Other Brother 15       spinal tumor  . Cervical cancer Other 22       d.22  . Cancer Sister 90       unspecified type  . Cancer Maternal Uncle 80       unspecified type    Social History   Socioeconomic History  . Marital status: Married    Spouse name: Not on file  . Number of children: Not on file  . Years of education: Not on file  . Highest education level: Not on file  Occupational History  . Not on file  Social Needs  . Financial resource strain: Somewhat hard  . Food insecurity    Worry: Never true    Inability: Never true  . Transportation needs    Medical: No    Non-medical: No  Tobacco Use  . Smoking status: Never Smoker  . Smokeless tobacco: Never Used  Substance and Sexual Activity  . Alcohol use: No    Comment: never  . Drug use: No  . Sexual activity: Yes  Lifestyle  . Physical activity    Days per week: 0 days    Minutes per session: 0 min  . Stress: Only a little  Relationships  . Social connections    Talks on phone: More than three times a week    Gets together: More than three times a week    Attends religious service: Never    Active member of club or organization: No    Attends meetings of clubs or organizations: Never    Relationship status: Married  . Intimate partner violence    Fear of current or ex partner: No    Emotionally abused: No    Physically abused: No    Forced sexual activity: No  Other Topics Concern  . Not on file  Social History Narrative  . Not on file    Review of Systems: ALL ROS discussed and all others negative except listed in HPI.  Physical Exam: Complete physical exam not performed due to the limits inherent in a telehealth encounter.  General: Awake, alert, and oriented, and well communicative. In no acute distress.  HEENT: EOMI, non-icteric sclera, NCAT, MMM  Neck: Normal movement of head and neck  Pulm: No labored breathing, speaking in full sentences without  conversational dyspnea  Derm: No apparent lesions or bruising in visible field  MS: Moves all visible extremities without noticeable abnormality  Psych: Pleasant, cooperative, normal speech, normal affect and normal insight Neuro: Alert and appropriate   Marvon Shillingburg L. Tarri Glenn, MD, MPH [facility] 10/20/2018, 8:40 AM

## 2018-10-20 NOTE — Anesthesia Preprocedure Evaluation (Addendum)
Anesthesia Evaluation  Patient identified by MRN, date of birth, ID band Patient awake    Reviewed: Allergy & Precautions, NPO status , Patient's Chart, lab work & pertinent test results, reviewed documented beta blocker date and time   History of Anesthesia Complications Negative for: history of anesthetic complications  Airway Mallampati: I  TM Distance: >3 FB Neck ROM: Full    Dental  (+) Dental Advisory Given, Missing   Pulmonary sleep apnea and Continuous Positive Airway Pressure Ventilation ,  10/17/2018 SARS coronavirus NEG   breath sounds clear to auscultation       Cardiovascular hypertension, Pt. on medications and Pt. on home beta blockers + angina  Rhythm:Irregular Rate:Normal  '19 cath: Non-obstructive CAD.  Moderate to severe pulmonary hypertension '19 ECHO: EF 45-50%, diffuse hypokinesis, valves OK   Neuro/Psych negative neurological ROS     GI/Hepatic Neg liver ROS, GERD  Controlled,  Endo/Other  diabetes (glu 180), Oral Hypoglycemic AgentsMorbid obesity  Renal/GU Renal InsufficiencyRenal disease     Musculoskeletal   Abdominal (+) + obese,   Peds  Hematology xarelto   Anesthesia Other Findings   Reproductive/Obstetrics                           Anesthesia Physical Anesthesia Plan  ASA: III  Anesthesia Plan: MAC   Post-op Pain Management:    Induction:   PONV Risk Score and Plan: 1 and Treatment may vary due to age or medical condition  Airway Management Planned: Natural Airway and Simple Face Mask  Additional Equipment:   Intra-op Plan:   Post-operative Plan: Extubation in OR  Informed Consent: I have reviewed the patients History and Physical, chart, labs and discussed the procedure including the risks, benefits and alternatives for the proposed anesthesia with the patient or authorized representative who has indicated his/her understanding and acceptance.      Dental advisory given  Plan Discussed with: CRNA and Surgeon  Anesthesia Plan Comments:        Anesthesia Quick Evaluation

## 2018-10-20 NOTE — Transfer of Care (Signed)
Immediate Anesthesia Transfer of Care Note  Patient: Thomas [name]  Procedure(s) Performed: Procedure(s): COLONOSCOPY WITH PROPOFOL (N/A) POLYPECTOMY  Patient Location: PACU  Anesthesia Type:MAC  Level of Consciousness: Patient easily awoken, sedated, comfortable, cooperative, following commands, responds to stimulation.   Airway & Oxygen Therapy: Patient spontaneously breathing, ventilating well, oxygen via simple oxygen mask.  Post-op Assessment: Report given to PACU RN, vital signs reviewed and stable, moving all extremities.   Post vital signs: Reviewed and stable.  Complications: No apparent anesthesia complications  Last Vitals:  Vitals Value Taken Time  BP 163/69 10/20/18 0920  Temp 36.6 C 10/20/18 0920  Pulse 63 10/20/18 0921  Resp 20 10/20/18 0921  SpO2 99 % 10/20/18 0921  Vitals shown include unvalidated device data.  Last Pain:  Vitals:   10/20/18 0920  TempSrc: Temporal  PainSc: 0-No pain         Complications: No apparent anesthesia complications

## 2018-10-20 NOTE — Op Note (Signed)
[facility] Patient Name: Thomas [name] Procedure Date: 10/20/2018 MRN: [id number] Attending MD: Thornton Park MD, MD Date of Birth: 05/29/1950 CSN: [id number] Age: 68 Admit Type: Outpatient Procedure:                Colonoscopy Indications:              High risk colon cancer surveillance: Personal                            history of colonic polyps Providers:                Thornton Park MD, MD, Ashley Jacobs, RN, Elspeth Cho Tech., Technician, Heide Scales, CRNA Referring MD:              Medicines:                See the Anesthesia note for documentation of the                            administered medications Complications:            No immediate complications. Estimated blood loss:                            Minimal. Estimated Blood Loss:     Estimated blood loss was minimal. Procedure:                Pre-Anesthesia Assessment:                           - Prior to the procedure, a History and Physical                            was performed, and patient medications and                            allergies were reviewed. The patient's tolerance of                            previous anesthesia was also reviewed. The risks                            and benefits of the procedure and the sedation                            options and risks were discussed with the patient.                            All questions were answered, and informed consent                            was obtained. Prior Anticoagulants: The patient has  taken no previous anticoagulant or antiplatelet                            agents. ASA Grade Assessment: III - A patient with                            severe systemic disease. After reviewing the risks                            and benefits, the patient was deemed in                            satisfactory condition to undergo the procedure.  After obtaining informed consent, the colonoscope                            was passed under direct vision. Throughout the                            procedure, the patient's blood pressure, pulse, and                            oxygen saturations were monitored continuously. The                            [id number] [id number]) Olympus colonoscope was                            introduced through the anus and advanced to the the                            terminal ileum, with identification of the                            appendiceal orifice and IC valve. A second forward                            view of the right colon was performed. The                            colonoscopy was performed without difficulty. The                            patient tolerated the procedure well. The quality                            of the bowel preparation was good. The terminal                            ileum, ileocecal valve, appendiceal orifice, and                            rectum were photographed. Scope In: 8:58:29 AM Scope Out: 9:14:52 AM Scope Withdrawal Time: 0 hours  14 minutes 16 seconds  Total Procedure Duration: 0 hours 16 minutes 23 seconds  Findings:      The perianal and digital rectal examinations were normal.      A 4 mm polyp was found in the ascending colon. The polyp was sessile.       The polyp was removed with a cold snare. Resection and retrieval were       complete.      Non-bleeding external and internal hemorrhoids were found. The       hemorrhoids were small. Impression:               - One 4 mm polyp in the ascending colon, removed                            with a cold snare. Resected and retrieved.                           - Non-bleeding external and internal hemorrhoids. Moderate Sedation:      Not Applicable - Patient had care per Anesthesia. Recommendation:           - Patient has a contact number available for                            emergencies. The signs  and symptoms of potential                            delayed complications were discussed with the                            patient. Return to normal activities tomorrow.                            Written discharge instructions were provided to the                            patient.                           - Resume previous diet today.                           - Continue present medications.                           - Resume Xarelto (rivaroxaban) at prior dose                            tomorrow.                           - Await pathology results.                           - Repeat colonoscopy in 7 years for surveillance. Procedure Code(s):        --- Professional ---  45385, Colonoscopy, flexible; with removal of                            tumor(s), polyp(s), or other lesion(s) by snare                            technique Diagnosis Code(s):        --- Professional ---                           Z86.010, Personal history of colonic polyps                           K63.5, Polyp of colon                           K64.8, Other hemorrhoids CPT copyright 2019 American Medical Association. All rights reserved. The codes documented in this report are preliminary and upon coder review may  be revised to meet current compliance requirements. Thornton Park MD, MD 10/20/2018 9:24:54 AM This report has been signed electronically. Number of Addenda: 0

## 2018-10-20 NOTE — Discharge Instructions (Signed)

## 2018-10-21 ENCOUNTER — Encounter: Payer: Self-pay | Admitting: Gastroenterology

## 2018-10-21 ENCOUNTER — Encounter (HOSPITAL_COMMUNITY): Payer: Self-pay | Admitting: Gastroenterology

## 2018-11-15 ENCOUNTER — Ambulatory Visit: Payer: Medicare HMO | Admitting: Family

## 2018-11-16 ENCOUNTER — Telehealth: Payer: Self-pay

## 2018-11-16 ENCOUNTER — Other Ambulatory Visit: Payer: Self-pay

## 2018-11-16 ENCOUNTER — Ambulatory Visit: Payer: Medicare HMO | Admitting: Family

## 2018-11-16 NOTE — Telephone Encounter (Signed)
The patient had an appointment to rule out UTI.  He knocked the specimen into the toilet and despite 2 cups of water and self I&O catheterization he could not give another specimen.  He was upset and left without being seen.

## 2018-11-18 ENCOUNTER — Other Ambulatory Visit: Payer: Self-pay | Admitting: Nurse Practitioner

## 2018-11-18 DIAGNOSIS — M170 Bilateral primary osteoarthritis of knee: Secondary | ICD-10-CM

## 2018-11-18 MED FILL — traMADol HCL 50 MG TABS: 50 | 8 days supply | Qty: 30 | Fill #0

## 2018-11-18 NOTE — Telephone Encounter (Signed)
Last filled 10/13/2018 in epic

## 2018-11-30 ENCOUNTER — Telehealth: Payer: Self-pay

## 2018-11-30 NOTE — Telephone Encounter (Signed)
Patient was scheduled to collect a urine sample and have an appointment on 11/16/2018.  Patient became frustrated when he collected the urine sample and dropped it in the toilet by accident. Patient left without being seen on 11/16/2018  Patient is now calling stating he is still having symptoms, burning when urinating and asked it his wife can come pick up a cup to process sample.    I advised patient that he needs to schedule an appointment. Patient asked that I get a directive from [name], NP  Please advise

## 2018-11-30 NOTE — Telephone Encounter (Signed)
Yes he will need an OV to be scheduled. Thank you.

## 2018-11-30 NOTE — Telephone Encounter (Signed)
Noted thank you

## 2018-11-30 NOTE — Telephone Encounter (Signed)
Patient states he is unable to come in due to knee injury and limited mobility.   Patient refused appointment today with Dinah in office or phone visit. Patient states he doesn't see the point in having an appointment. Patient states all he needs is a cup to see if he has a UTI.    Patient will call his urologist in [facility] to see if they will give an order and a urine cup with no appointment.  I advised patient to call us back if needed

## 2018-12-09 ENCOUNTER — Other Ambulatory Visit: Payer: Self-pay | Admitting: Nurse Practitioner

## 2018-12-09 DIAGNOSIS — I10 Essential (primary) hypertension: Secondary | ICD-10-CM

## 2018-12-09 DIAGNOSIS — E1122 Type 2 diabetes mellitus with diabetic chronic kidney disease: Secondary | ICD-10-CM

## 2018-12-15 ENCOUNTER — Other Ambulatory Visit: Payer: Self-pay

## 2018-12-15 ENCOUNTER — Ambulatory Visit: Payer: Self-pay

## 2018-12-15 ENCOUNTER — Ambulatory Visit (INDEPENDENT_AMBULATORY_CARE_PROVIDER_SITE_OTHER): Payer: Medicare HMO | Admitting: Family

## 2018-12-15 ENCOUNTER — Encounter: Payer: Self-pay | Admitting: Nurse Practitioner

## 2018-12-15 ENCOUNTER — Encounter: Payer: Self-pay | Admitting: Family

## 2018-12-15 VITALS — HR 77

## 2018-12-15 DIAGNOSIS — E1122 Type 2 diabetes mellitus with diabetic chronic kidney disease: Secondary | ICD-10-CM | POA: Diagnosis not present

## 2018-12-15 DIAGNOSIS — I5022 Chronic systolic (congestive) heart failure: Secondary | ICD-10-CM | POA: Diagnosis not present

## 2018-12-15 MED ORDER — GLIPIZIDE 5 MG PO TABS: 5.0000 mg | ORAL_TABLET | Freq: Every day | ORAL | 1 refills | Status: DC

## 2018-12-15 NOTE — Progress Notes (Signed)
This service is provided via telemedicine  No vital signs collected/recorded due to the encounter was a telemedicine visit.   Location of patient (ex: home, work):  Home  Patient consents to a telephone visit:  Yes  Location of the provider (ex: office, home):  Office   Name of any referring provider:  Sherrie Mustache, NP   Names of all persons participating in the telemedicine service and their role in the encounter:  Maurisha Mongeau NP, Ruthell Rummage CMA, and patient  Time spent on call:  Ruthell Rummage CMA, spent 10  minutes on phone with patient.     Provider: Agatha Duplechain FNP-C  [name], NP  Patient Care Team: [name], NP as PCP - General (Geriatric Medicine) Jettie Booze, MD as PCP - Cardiology (Cardiology) Katy Fitch, Darlina Guys, MD as Consulting Physician (Ophthalmology)  Extended Emergency Contact Information Primary Emergency Contact: [facility] Address: [address]          [place], [place] [address] [place] of [place] Phone: [phone] Mobile Phone: [phone] Relation: Spouse Secondary Emergency Contact: Mailhot,Kabirah A Address: [address], [place] [address] [place] Phone: [phone] Relation: Daughter  Code Status: Full Code  Goals of care: Advanced Directive information Advanced Directives 10/20/2018  Does Patient Have a Medical Advance Directive? No  Type of Advance Directive -  Does patient want to make changes to medical advance directive? -  Copy of [place] in Chart? -  Would patient like information on creating a medical advance directive? -     Chief Complaint  Patient presents with  . Acute Visit    Patient states he was tested for COVID and test came back for positive antibodies  would like to discuss with provider. Patient states he is just having trouble with breathing     HPI:  Pt is a 68 y.o. male seen today  for an  acute visit for evaluation of shortness of breath with exertion.He states had shortness of breath on Saturday 12/03/2018 went to [facility] and was tested for COVID-19.He states test came back for positive antibodies.He states chest X-ray was done unclear what showed up.He was scheduled for a CAT scan to be done next week on Tuesday 12/20/2018.He states provider at [facility] recommended referral to a Pulmonologist.He states was told to switch provider to [facility] since they have everything within the practice.He states chose to follow up with his regular PCP Sherrie Mustache NP since she has knows him more.He would like to know whether he needs the CAT scan done.He states CAT scan has been approved by his insurance to be done.He has shortness of breath with exertion and coughs up phlegm.He denies any fever,chills,chest pain,palpitation or wheezing.I've discussed with him to request records of his chest X-ray  He checks his oxygen saturation at home readings ranging from 97-98%.HR 75 b/min.    Past Medical History:  Diagnosis Date  . Acute on chronic diastolic CHF (congestive heart failure) ([place]) 06/01/2017  . Arthritis   . Cancer [facility]) 2010   Prostate  . Chronic kidney disease   . Diabetes mellitus   . Diabetic neuropathy (HCC)    feet  . DVT (deep venous thrombosis) ([place])   . Genetic testing 06/22/2016   Mr. Trueheart underwent genetic counseling and testing for hereditary cancer syndromes on 05/14/2016. His results were negative for mutations in all 57  genes analyzed by [facility]'s 46-gene Common Hereditary Cancers Panel. Genes analyzed include: APC, ATM, AXIN2, BARD1, BMPR1A, BRCA1, BRCA2, BRIP1, CDH1, CDKN2A, CHEK2, CTNNA1, DICER1, EPCAM, GREM1, HOXB13, KIT, MEN1, MLH1, MSH2, MSH3, MSH6, MUTYH, NB  . GERD (gastroesophageal reflux disease)   . Hypertension   . PE (pulmonary thromboembolism) ([place])   . Pneumonia   . Sleep apnea    not wearing CPAP   Past  Surgical History:  Procedure Laterality Date  . CARDIOVERSION N/A 06/03/2017   Procedure: CARDIOVERSION;  Surgeon: Jerline Pain, MD;  Location: [facility] ENDOSCOPY;  Service: Cardiovascular;  Laterality: N/A;  . COLONOSCOPY    . COLONOSCOPY WITH PROPOFOL N/A 10/20/2018   Procedure: COLONOSCOPY WITH PROPOFOL;  Surgeon: Thornton Park, MD;  Location: [facility] ENDOSCOPY;  Service: Gastroenterology;  Laterality: N/A;  . HERNIA REPAIR    . KNEE SURGERY    . MULTIPLE TOOTH EXTRACTIONS    . POLYPECTOMY  10/20/2018   Procedure: POLYPECTOMY;  Surgeon: Thornton Park, MD;  Location: [facility] ENDOSCOPY;  Service: Gastroenterology;;  . PROSTATE SURGERY    . RADIOACTIVE SEED IMPLANT    . RIGHT/LEFT HEART CATH AND CORONARY ANGIOGRAPHY N/A 07/06/2017   Procedure: RIGHT/LEFT HEART CATH AND CORONARY ANGIOGRAPHY;  Surgeon: Jettie Booze, MD;  Location: [place] CV LAB;  Service: Cardiovascular;  Laterality: N/A;  . SHOULDER SURGERY    . TOTAL KNEE ARTHROPLASTY Right 11/19/2016   Procedure: RIGHT TOTAL KNEE ARTHROPLASTY;  Surgeon: Leandrew Koyanagi, MD;  Location: [place];  Service: Orthopedics;  Laterality: Right;  . uretha surgery-2014      Allergies  Allergen Reactions  . Lisinopril Cough    Outpatient Encounter Medications as of 12/15/2018  Medication Sig  . Accu-Chek Softclix Lancets lancets Use to test blood sugar daily. Dx: E11.22  . Alcohol Swabs (B-D SINGLE USE SWABS REGULAR) PADS Use in testing blood sugar daily. Dx: E11.22  . AMBULATORY NON FORMULARY MEDICATION Full Face CPAP Mask with Tubing Dx: G47.30  . amLODipine (NORVASC) 10 MG tablet TAKE 1 TABLET DAILY FOR HIGH BLOOD PRESSURE  . aspirin EC 81 MG tablet Take 81 mg by mouth daily.  Marland Kitchen atorvastatin (LIPITOR) 20 MG tablet Take one tablet by mouth once daily for cholesterol  . Blood Glucose Calibration (ACCU-CHEK AVIVA) SOLN Use once daily as directed dx E11.22  . Blood Glucose Monitoring Suppl (ACCU-CHEK AVIVA PLUS) w/Device KIT Once daily as  directed dx E11.22  . carvedilol (COREG) 12.5 MG tablet Take 1 tablet (12.5 mg total) by mouth 2 (two) times daily with a meal.  . empagliflozin (JARDIANCE) 10 MG TABS tablet Take 10 mg by mouth daily.  . furosemide (LASIX) 40 MG tablet Take 1 tablet (40 mg total) by mouth daily.  Marland Kitchen glipiZIDE (GLUCOTROL) 5 MG tablet TAKE 1 TABLET DAILY BEFORE BREAKFAST.  Marland Kitchen glucose blood (ACCU-CHEK AVIVA PLUS) test strip 1 each by Other route as needed for other. Use to test blood sugar daily. Dx: E11.22  . hydrALAZINE (APRESOLINE) 25 MG tablet Take 1 tablet (25 mg total) by mouth 3 (three) times daily.  Marland Kitchen losartan (COZAAR) 100 MG tablet Take 1 tablet (100 mg total) by mouth daily.  . metFORMIN (GLUCOPHAGE) 500 MG tablet Take 1 tablet (500 mg total) by mouth 2 (two) times daily with a meal.  . pantoprazole (PROTONIX) 40 MG tablet Take 1 tablet (40 mg total) by mouth daily.  . potassium chloride SA (K-DUR) 20 MEQ tablet Take 1 tablet (20 mEq total) by mouth 2 (two) times daily.  Marland Kitchen  rivaroxaban (XARELTO) 20 MG TABS tablet Take 1 tablet (20 mg total) by mouth daily with supper.  . traMADol (ULTRAM) 50 MG tablet TAKE 1 TABLET BY MOUTH EVERY 6 HOURS AS NEEDED FOR PAIN  . UNABLE TO FIND CPAP Hose: use daily with CPAP machine. DX G47.30   No facility-administered encounter medications on file as of 12/15/2018.     Review of Systems  Constitutional: Positive for fatigue. Negative for appetite change, chills and fever.  HENT: Negative for congestion, rhinorrhea, sinus pressure, sinus pain, sneezing, sore throat and trouble swallowing.   Eyes: Negative for discharge, redness and itching.  Respiratory: Negative for cough, chest tightness and wheezing.        Shortness of breath with exertion had CXR doen at [facility] and CAT scan was ordered to be done 12/20/2018   Cardiovascular: Negative for chest pain and palpitations.       Swelling stable on ankles  Gastrointestinal: Negative for abdominal distention,  abdominal pain, constipation, diarrhea, nausea and vomiting.  Genitourinary: Negative for decreased urine volume.       In and out cath   Musculoskeletal: Positive for arthralgias.  Skin: Negative for color change, pallor and rash.  Neurological: Negative for dizziness, light-headedness and headaches.  Psychiatric/Behavioral: Negative for agitation, confusion and sleep disturbance. The patient is not nervous/anxious.     Immunization History  Administered Date(s) Administered  . Fluad Quad(high Dose 65+) 12/09/2018  . Influenza, High Dose Seasonal PF 12/14/2016  . Influenza,inj,Quad PF,6+ Mos 11/22/2013, 12/05/2014, 10/24/2015  . Influenza-Unspecified 11/29/2017  . Pneumococcal Conjugate-13 05/23/2014  . Pneumococcal Polysaccharide-23 12/13/2012, 05/21/2016   Pertinent  Health Maintenance Due  Topic Date Due  . OPHTHALMOLOGY EXAM  01/18/2018  . INFLUENZA VACCINE  09/17/2018  . FOOT EXAM  12/10/2018  . HEMOGLOBIN A1C  02/15/2019  . COLONOSCOPY  10/19/2028  . PNA vac Low Risk Adult  Completed   Fall Risk  12/15/2018 08/18/2018 04/14/2018 12/09/2017 12/09/2017  Falls in the past year? 0 0 0 No No  Number falls in past yr: 0 0 0 - -  Injury with Fall? 0 0 0 - -  Comment - - - - -    Vitals:   12/15/18 0932  Pulse: 77  SpO2: 94%   There is no height or weight on file to calculate BMI. Physical Exam  Unable to complete on telephone visit.   Labs reviewed: Recent Labs    04/14/18 0925 08/16/18 0855  NA 140 146  K 3.4* 3.6  CL 101 107  CO2 26 28  GLUCOSE 169* 194*  BUN 29* 15  CREATININE 1.35* 1.04  CALCIUM 9.1 8.9   Recent Labs    04/14/18 0925 08/16/18 0855  AST 9* 12  ALT 14 11  BILITOT 0.6 0.6  PROT 7.4 6.9   Recent Labs    04/14/18 0925  WBC 7.8  NEUTROABS 5,834  HGB 13.0*  HCT 40.3  MCV 81.6  PLT 300   Lab Results  Component Value Date   TSH 1.471 06/01/2017   Lab Results  Component Value Date   HGBA1C 7.9 (H) 08/16/2018   Lab Results   Component Value Date   CHOL 148 08/16/2018   HDL 51 08/16/2018   LDLCALC 83 08/16/2018   TRIG 68 08/16/2018   CHOLHDL 2.9 08/16/2018    Significant Diagnostic Results in last 30 days:  No results found.  Assessment/Plan 1. Type 2 diabetes mellitus with chronic kidney disease, without long-term current use of  insulin, unspecified CKD stage (HCC) Request refill for glipizide.  - glipiZIDE (GLUCOTROL) 5 MG tablet; Take 1 tablet (5 mg total) by mouth daily before breakfast.  Dispense: 90 tablet; Refill: 1   2. Congestive heart Failure  Reports shortness of breath with exertion.coughs some phlegm.No abrupt weight gain or worsening of edema.CXR done at [facility] then referred to get a CAT scan 12/20/2018 and Pulmonologist referral.I've advised patient to contact [facility] to send medical records to [facility].continue on Furosemide 40 mg tablet daily,coreg 12.5 mg tablet twice daily,Hydralazine 25 mg tablet three times daily and losartan 100 mg tablet daily.   Family/ staff Communication: Reviewed plan of care with patient.   Labs/tests ordered: None  Spent 17 minutes of non-face to face with patient    Sandrea Hughs, NP

## 2018-12-28 ENCOUNTER — Other Ambulatory Visit: Payer: Self-pay | Admitting: Family

## 2018-12-28 DIAGNOSIS — M170 Bilateral primary osteoarthritis of knee: Secondary | ICD-10-CM

## 2018-12-28 MED FILL — traMADol HCL 50 MG TABS: 50 | 8 days supply | Qty: 30 | Fill #0

## 2018-12-28 NOTE — Telephone Encounter (Signed)
Patient request refill. Verified in [place] database last refill was 11/18/2018. Last visit was 08/18/2018 and next has not been scheduled. Routing to provider for approval

## 2019-01-26 ENCOUNTER — Other Ambulatory Visit: Payer: Self-pay | Admitting: Family

## 2019-01-26 DIAGNOSIS — M170 Bilateral primary osteoarthritis of knee: Secondary | ICD-10-CM

## 2019-01-26 MED FILL — CARVEDILOL 12.5 MG TABLET: 12.5 | 90 days supply | Qty: 180 | Fill #1

## 2019-01-27 ENCOUNTER — Encounter: Payer: Self-pay | Admitting: Family

## 2019-01-27 ENCOUNTER — Ambulatory Visit (INDEPENDENT_AMBULATORY_CARE_PROVIDER_SITE_OTHER): Payer: Medicare HMO | Admitting: Family

## 2019-01-27 ENCOUNTER — Other Ambulatory Visit: Payer: Self-pay

## 2019-01-27 DIAGNOSIS — M170 Bilateral primary osteoarthritis of knee: Secondary | ICD-10-CM

## 2019-01-27 MED ORDER — TRAMADOL HCL 50 MG PO TABS: 50.0000 mg | ORAL_TABLET | Freq: Four times a day (QID) | ORAL | 0 refills | Status: DC | PRN

## 2019-01-27 MED FILL — traMADol HCL 50 MG TABS: 50 | 7 days supply | Qty: 30 | Fill #0

## 2019-01-27 NOTE — Patient Instructions (Signed)
Please sign your pain contract letter and return to [facility] at [address],[place] [address]

## 2019-01-27 NOTE — Telephone Encounter (Signed)
Last filled 12/28/2018

## 2019-01-27 NOTE — Progress Notes (Signed)
This service is provided via telemedicine  No vital signs collected/recorded due to the encounter was [name] telemedicine visit.   Location of patient (ex: home, work):  Home   Patient consents to [name] telephone visit:  Yes  Location of the provider (ex: office, home):  Office   Name of any referring provider:  Sherrie Mustache, NP  Names of all persons participating in the telemedicine service and their role in the encounter:  Thomas Sax, NP, Thomas [name] CMA, and patient with wife  Time spent on call:  Thomas [name] CMA spent  10 minutes on phone with patient   Location:      Place of Service:    Provider: Lenardo Westwood FNP-C  Thomas Chandler, NP  Patient Care Team: Thomas Chandler, NP as PCP - General (Geriatric Medicine) Thomas Booze, MD as PCP - Cardiology (Cardiology) Thomas [name], Thomas Guys, MD as Consulting Physician (Ophthalmology)  Extended Emergency Contact Information Primary Emergency Contact: Thomas [name] Address: [address]          [place], [place] [address] [place] [name] of [place] Phone: [phone] Mobile Phone: [phone] Relation: Spouse Secondary Emergency Contact: Thomas [name] Address: [address], [place] [address] [place] Phone: [phone] Relation: Daughter  Code Status: Full Code  Goals of care: Advanced Directive information Advanced Directives 10/20/2018  Does Patient Have [name] Medical Advance Directive? No  Type of Advance Directive -  Does patient want to make changes to medical advance directive? -  Copy of [place] in Chart? -  Would patient like information on creating [name] medical advance directive? -     Chief Complaint  Patient presents with  . Medical Management of Chronic Issues    Patient is covid positive needs refill on controled medication and would like to update pain management contract with provider.    HPI:  Pt is [name] 68 y.o. male  seen today for an acute visit for pain management.He has chronic knee pain due to osteoarthritis.He states Tramadol has been effective for controlling his pain.He denies any increased sleepiness on tramadol.He is due for renewal of his pain management contract.His opoid-induced respiratory depression overdose risk discussed with patient.He states has been on tramadol for long time and has not had any problems.He does not take every 6 hours just gets 30 pills which last him [name] month. He status post COVID-19  Positive unable to come to the office to renew his pain management contract with provider.Will mail to patient to sign and return to the office.He is due for his pain medication refill.    Past Medical History:  Diagnosis Date  . Acute on chronic diastolic CHF (congestive heart failure) ([place]) 06/01/2017  . Arthritis   . Cancer [facility]) 2010   Prostate  . Chronic kidney disease   . Diabetes mellitus   . Diabetic neuropathy (H[facility])    feet  . DVT (deep venous thrombosis) ([place])   . Genetic testing 06/22/2016   Mr. Thomas [name] underwent genetic counseling and testing for hereditary cancer syndromes on 05/14/2016. His results were negative for mutations in all 46 genes analyzed by [facility]'s 46-gene Common Hereditary Cancers Panel. Genes analyzed include: APC, ATM, AXIN2, BARD1, BMPR1A, BRCA1, BRCA2, BRIP1, CDH1, CDKN2A, CHEK2, CTNNA1, DICER1, EPCAM, GREM1, HOXB13, KIT, MEN1, MLH1, MSH2, MSH3, MSH6, MUTYH, NB  . GERD (gastroesophageal reflux disease)   . Hypertension   . PE (pulmonary thromboembolism) ([place])   .  Pneumonia   . Sleep apnea    not wearing CPAP   Past Surgical History:  Procedure Laterality Date  . CARDIOVERSION N/[name] 06/03/2017   Procedure: CARDIOVERSION;  Surgeon: Jerline Pain, MD;  [name] ENDOSCOPY;  Service: Cardiovascular;  Laterality: N/[name];  . COLONOSCOPY    . COLONOSCOPY WITH PROPOFOL N/[name] 10/20/2018   Procedure: COLONOSCOPY WITH PROPOFOL;  Surgeon: Thornton Park, MD;  Location:  [facility] ENDOSCOPY;  Service: Gastroenterology;  Laterality: N/[name];  . HERNIA REPAIR    . KNEE SURGERY    . MULTIPLE TOOTH EXTRACTIONS    . POLYPECTOMY  10/20/2018   Procedure: POLYPECTOMY;  Surgeon: Thornton Park, MD;  Location: [facility] ENDOSCOPY;  Service: Gastroenterology;;  . PROSTATE SURGERY    . RADIOACTIVE SEED IMPLANT    . RIGHT/LEFT HEART CATH AND CORONARY ANGIOGRAPHY N/[name] 07/06/2017   Procedure: RIGHT/LEFT HEART CATH AND CORONARY ANGIOGRAPHY;  Surgeon: Thomas Booze, MD;  Location: [facility];  Service: Cardiovascular;  Laterality: N/[name];  . SHOULDER SURGERY    . TOTAL KNEE ARTHROPLASTY Right 11/19/2016   Procedure: RIGHT TOTAL KNEE ARTHROPLASTY;  Surgeon: Leandrew Koyanagi, MD;  Location: [facility];  Service: Orthopedics;  Laterality: Right;  . uretha surgery-2014      Allergies  Allergen Reactions  . Lisinopril Cough    Outpatient Encounter Medications as of 01/27/2019  Medication Sig  . Accu-Chek Softclix Lancets lancets Use to test blood sugar daily. Dx: E11.22  . Alcohol Swabs (B-D SINGLE USE SWABS REGULAR) PADS Use in testing blood sugar daily. Dx: E11.22  . AMBULATORY NON FORMULARY MEDICATION Full Face CPAP Mask with Tubing Dx: G47.30  . amLODipine (NORVASC) 10 MG tablet TAKE 1 TABLET DAILY FOR HIGH BLOOD PRESSURE  . aspirin EC 81 MG tablet Take 81 mg by mouth daily.  Marland Kitchen atorvastatin (LIPITOR) 20 MG tablet Take one tablet by mouth once daily for cholesterol  . Blood Glucose Calibration (ACCU-CHEK AVIVA) SOLN Use once daily as directed dx E11.22  . Blood Glucose Monitoring Suppl (ACCU-CHEK AVIVA PLUS) w/Device KIT Once daily as directed dx E11.22  . carvedilol (COREG) 12.5 MG tablet Take 1 tablet (12.5 mg total) by mouth 2 (two) times daily with [name] meal.  . empagliflozin (JARDIANCE) 10 MG TABS tablet Take 10 mg by mouth daily.  . furosemide (LASIX) 40 MG tablet Take 1 tablet (40 mg total) by mouth daily.  Marland Kitchen glipiZIDE (GLUCOTROL) 5 MG tablet Take 1 tablet (5 mg total) by mouth  daily before breakfast.  . glucose blood (ACCU-CHEK AVIVA PLUS) test strip 1 each by Other route as needed for other. Use to test blood sugar daily. Dx: E11.22  . hydrALAZINE (APRESOLINE) 25 MG tablet Take 1 tablet (25 mg total) by mouth 3 (three) times daily.  Marland Kitchen losartan (COZAAR) 100 MG tablet Take 1 tablet (100 mg total) by mouth daily.  . metFORMIN (GLUCOPHAGE) 500 MG tablet Take 1 tablet (500 mg total) by mouth 2 (two) times daily with [name] meal.  . pantoprazole (PROTONIX) 40 MG tablet Take 1 tablet (40 mg total) by mouth daily.  . potassium chloride SA (K-DUR) 20 MEQ tablet Take 1 tablet (20 mEq total) by mouth 2 (two) times daily.  . rivaroxaban (XARELTO) 20 MG TABS tablet Take 1 tablet (20 mg total) by mouth daily with supper.  . traMADol (ULTRAM) 50 MG tablet TAKE 1 TABLET BY MOUTH EVERY 6 HOURS AS NEEDED FOR PAIN  . UNABLE TO FIND CPAP Hose: use daily with CPAP machine. DX G47.30  No facility-administered encounter medications on file as of 01/27/2019.    Review of Systems  Constitutional: Negative for chills and fatigue.  HENT: Negative for congestion, rhinorrhea, sinus pressure, sinus pain, sneezing, sore throat and tinnitus.   Respiratory: Negative for cough, chest tightness and wheezing.   Cardiovascular: Negative for chest pain, palpitations and leg swelling.  Gastrointestinal: Negative for abdominal distention, abdominal pain, constipation, diarrhea, nausea and vomiting.  Genitourinary:       In and out cath twice daily.  Musculoskeletal: Positive for arthralgias.  Skin: Negative for color change, pallor and rash.  Neurological: Negative for dizziness and headaches.       Chronic neuropathy   Hematological: Does not bruise/bleed easily.  Psychiatric/Behavioral: Negative for agitation, confusion and sleep disturbance. The patient is not nervous/anxious.     Immunization History  Administered Date(s) Administered  . Fluad Quad(high Dose 65+) 12/09/2018  . Influenza, High  Dose Seasonal PF 12/14/2016  . Influenza,inj,Quad PF,6+ Mos 11/22/2013, 12/05/2014, 10/24/2015  . Influenza-Unspecified 11/29/2017  . Pneumococcal Conjugate-13 05/23/2014  . Pneumococcal Polysaccharide-23 12/13/2012, 05/21/2016   Pertinent  Health Maintenance Due  Topic Date Due  . OPHTHALMOLOGY EXAM  01/18/2018  . FOOT EXAM  12/10/2018  . HEMOGLOBIN A1C  02/15/2019  . COLONOSCOPY  10/19/2028  . INFLUENZA VACCINE  Completed  . PNA vac Low Risk Adult  Completed   Fall Risk  01/27/2019 12/15/2018 08/18/2018 04/14/2018 12/09/2017  Falls in the past year? 0 0 0 0 No  Number falls in past yr: 0 0 0 0 -  Injury with Fall? 0 0 0 0 -  Comment - - - - -   There were no vitals filed for this visit. There is no height or weight on file to calculate BMI. Physical Exam  Unable to complete on Telephone visit.   Labs reviewed: Recent Labs    04/14/18 0925 08/16/18 0855  NA 140 146  K 3.4* 3.6  CL 101 107  CO2 26 28  GLUCOSE 169* 194*  BUN 29* 15  CREATININE 1.35* 1.04  CALCIUM 9.1 8.9   Recent Labs    04/14/18 0925 08/16/18 0855  AST 9* 12  ALT 14 11  BILITOT 0.6 0.6  PROT 7.4 6.9   Recent Labs    04/14/18 0925  WBC 7.8  NEUTROABS 5,834  HGB 13.0*  HCT 40.3  MCV 81.6  PLT 300   Lab Results  Component Value Date   TSH 1.471 06/01/2017   Lab Results  Component Value Date   HGBA1C 7.9 (H) 08/16/2018   Lab Results  Component Value Date   CHOL 148 08/16/2018   HDL 51 08/16/2018   LDLCALC 83 08/16/2018   TRIG 68 08/16/2018   CHOLHDL 2.9 08/16/2018    Significant Diagnostic Results in last 30 days:  No results found.  Assessment/Plan  Primary osteoarthritis of both knees Pain under control with current regimen.Has tried topical analgesic.Encouraged to take Extra strength 500 mg tablet every 8 hour sa needed.Will mail pain contract  - traMADol (ULTRAM) 50 MG tablet; Take 1 tablet (50 mg total) by mouth every 6 (six) hours as needed. for pain  Dispense: 30  tablet; Refill: 0  Family/ staff Communication: Reviewed plan of care with patient.  Labs/tests ordered: None   Spent 12 minutes of non-face to face with patient    Sandrea Hughs, NP

## 2019-01-27 NOTE — Telephone Encounter (Signed)
Pt has no upcoming appts, let have him follow up and needs to update pain management contract. Once he has set these appts will refill

## 2019-01-27 NOTE — Telephone Encounter (Signed)
Pt scheduled appt today with [facility]

## 2019-02-02 ENCOUNTER — Other Ambulatory Visit: Payer: Self-pay | Admitting: Nurse Practitioner

## 2019-02-02 DIAGNOSIS — E785 Hyperlipidemia, unspecified: Secondary | ICD-10-CM

## 2019-02-09 ENCOUNTER — Other Ambulatory Visit: Payer: Self-pay | Admitting: Nurse Practitioner

## 2019-02-09 DIAGNOSIS — I5042 Chronic combined systolic (congestive) and diastolic (congestive) heart failure: Secondary | ICD-10-CM

## 2019-02-13 NOTE — Telephone Encounter (Signed)
rx sent to pharmacy by e-script  

## 2019-03-28 ENCOUNTER — Other Ambulatory Visit: Payer: Self-pay | Admitting: Family

## 2019-03-28 DIAGNOSIS — M170 Bilateral primary osteoarthritis of knee: Secondary | ICD-10-CM

## 2019-03-28 MED FILL — traMADol HCL 50 MG TABS: 50 | 7 days supply | Qty: 30 | Fill #0

## 2019-03-28 NOTE — Telephone Encounter (Signed)
Patient wife called requesting refill for patient [place] Verified LR: 01/27/2019 Narcotic Contract Updated.  Pended Rx and sent to [facility] for approval.

## 2019-03-28 NOTE — Telephone Encounter (Signed)
This is fine, thank you.

## 2019-03-28 NOTE — Telephone Encounter (Signed)
Called and schedule an appointment with Patient. Patient can only do Tuesday or Thursday because he has to take his wife to dialysis on Mon,Wed and Friday.  Patient scheduled an appointment for March 9 with Dinah.  Patient had to schedule out because his daughter is having surgery.

## 2019-03-28 NOTE — Telephone Encounter (Signed)
Will refill but has not had a routine follow up since July! NEEDS APPT BEFORE ADDITIONAL REFILL. Please have him schedule a routine follow up with me in office within the next month.  Thank you

## 2019-04-25 ENCOUNTER — Ambulatory Visit: Payer: Medicare HMO | Admitting: Family

## 2019-05-01 ENCOUNTER — Other Ambulatory Visit: Payer: Self-pay | Admitting: Nurse Practitioner

## 2019-05-01 DIAGNOSIS — E1122 Type 2 diabetes mellitus with diabetic chronic kidney disease: Secondary | ICD-10-CM

## 2019-05-01 MED FILL — JARDIANCE 10 MG TABLET: 10 | 30 days supply | Qty: 30 | Fill #0

## 2019-05-10 MED FILL — JARDIANCE 10 MG TABLET: 10 | 30 days supply | Qty: 30 | Fill #0

## 2019-05-11 ENCOUNTER — Ambulatory Visit (INDEPENDENT_AMBULATORY_CARE_PROVIDER_SITE_OTHER): Payer: Medicare HMO | Admitting: Family

## 2019-05-11 ENCOUNTER — Other Ambulatory Visit: Payer: Self-pay | Admitting: *Deleted

## 2019-05-11 ENCOUNTER — Other Ambulatory Visit: Payer: Self-pay

## 2019-05-11 ENCOUNTER — Encounter: Payer: Self-pay | Admitting: Family

## 2019-05-11 VITALS — BP 180/80 | HR 58 | Temp 98.1°F | Ht 69.0 in | Wt 318.0 lb

## 2019-05-11 DIAGNOSIS — I5022 Chronic systolic (congestive) heart failure: Secondary | ICD-10-CM

## 2019-05-11 DIAGNOSIS — I480 Paroxysmal atrial fibrillation: Secondary | ICD-10-CM

## 2019-05-11 DIAGNOSIS — E785 Hyperlipidemia, unspecified: Secondary | ICD-10-CM

## 2019-05-11 DIAGNOSIS — E1122 Type 2 diabetes mellitus with diabetic chronic kidney disease: Secondary | ICD-10-CM

## 2019-05-11 DIAGNOSIS — M170 Bilateral primary osteoarthritis of knee: Secondary | ICD-10-CM

## 2019-05-11 DIAGNOSIS — Z23 Encounter for immunization: Secondary | ICD-10-CM

## 2019-05-11 DIAGNOSIS — N183 Chronic kidney disease, stage 3 unspecified: Secondary | ICD-10-CM

## 2019-05-11 MED ORDER — TRAMADOL HCL 50 MG PO TABS: 50.0000 mg | ORAL_TABLET | Freq: Four times a day (QID) | ORAL | 0 refills | Status: DC | PRN

## 2019-05-11 MED ORDER — POTASSIUM CHLORIDE CRYS ER 20 MEQ PO TBCR: 20.0000 meq | EXTENDED_RELEASE_TABLET | Freq: Two times a day (BID) | ORAL | 1 refills | Status: DC

## 2019-05-11 MED ORDER — TETANUS-DIPHTH-ACELL PERTUSSIS 5-2.5-18.5 LF-MCG/0.5 IM SUSP: 0.5000 mL | Freq: Once | INTRAMUSCULAR | 0 refills | Status: AC

## 2019-05-11 MED ORDER — GLIPIZIDE 5 MG PO TABS: 5.0000 mg | ORAL_TABLET | Freq: Every day | ORAL | 1 refills | Status: DC

## 2019-05-11 MED FILL — POTASSIUM CHLORIDE CRYS ER: 20 | 90 days supply | Qty: 180 | Fill #0

## 2019-05-11 MED FILL — traMADol HCL 50 MG TABS: 50 | 7 days supply | Qty: 30 | Fill #0

## 2019-05-11 MED FILL — glipiZIDE 5 MG TABS: 5 | 90 days supply | Qty: 90 | Fill #0

## 2019-05-11 NOTE — Progress Notes (Signed)
Provider: Kymani Laursen FNP-C   [name], NP  Patient Care Team: [name], NP as PCP - General (Geriatric Medicine) Jettie Booze, MD as PCP - Cardiology (Cardiology) Katy Fitch, Darlina Guys, MD as Consulting Physician (Ophthalmology)  Extended Emergency Contact Information Primary Emergency Contact: [facility] Address: [address]          [place], [place] [address] [place] Litter of [place] Phone: [phone] Mobile Phone: [phone] Relation: Spouse Secondary Emergency Contact: Gucciardo,Kabirah A Address: [address], [place] [address] [place] Phone: [phone] Relation: Daughter  Code Status: Full Code  Goals of care: Advanced Directive information Advanced Directives 10/20/2018  Does Patient Have a Medical Advance Directive? No  Type of Advance Directive -  Does patient want to make changes to medical advance directive? -  Copy of [place] in Chart? -  Would patient like information on creating a medical advance directive? -     Chief Complaint  Patient presents with  . Medical Management of Chronic Issues    follow-up    HPI:  Pt is a 69 y.o. male seen today for 6 months follow up for medical management of chronic diseases.he denies any acute issues today.He is here with wife.he states tested positive for COVID-19 but has recovered well.    Hypertension - B/p elevated today though states has not taken his morning  Medication.He denies any chest pain,headache,dizziness or shortness of breath.on Amlodipine 10 mg tablet daily,losartan 100 mg tablet daily,coreg 12.5 mg tablet twice daily,Furosemide 40 mg tablet daily and hydralazine 25 mg tablet three times daily.   Type 2 DM - recalls CBG readings 170's-180's.Has not seen podiatrist.checks blood sugars twice daily.on glipizide 5 mg tablet daily,Metfromin 500 mg tablet twice daily and Jardiance 10 mg tablet daily. Had lab  work that was not draw 2020 possible due to COVID-19.   Hyperlipidemia - on atorvastatin 20 mg tablet daily.states trying to loss weight by cutting down on carbohydrate.He has been eating only soups calls it only liquids though names broccoli soup.He has had a 6 lbs weight loss over one month.   Afib - denies any palpitation or chest pain.on Xarelto 20 mg tablet daily and Carvedilol 12.5 mg tablet twice daily.  CHF - has had weight loss.on Furosemide 40 mg tablet daily and Potassium chloride 20 MEQ twice daily.  Due for Tdap vaccine discussed with patient to get vaccine at his pharmacy script will be send today.     Past Medical History:  Diagnosis Date  . Acute on chronic diastolic CHF (congestive heart failure) ([place]) 06/01/2017  . Arthritis   . Cancer [facility]) 2010   Prostate  . Chronic kidney disease   . Diabetes mellitus   . Diabetic neuropathy (HCC)    feet  . DVT (deep venous thrombosis) ([place])   . Genetic testing 06/22/2016   Mr. Klemp underwent genetic counseling and testing for hereditary cancer syndromes on 05/14/2016. His results were negative for mutations in all 46 genes analyzed by [facility]'s 46-gene Common Hereditary Cancers Panel. Genes analyzed include: APC, ATM, AXIN2, BARD1, BMPR1A, BRCA1, BRCA2, BRIP1, CDH1, CDKN2A, CHEK2, CTNNA1, DICER1, EPCAM, GREM1, HOXB13, KIT, MEN1, MLH1, MSH2, MSH3, MSH6, MUTYH, NB  . GERD (gastroesophageal reflux disease)   . Hypertension   . PE (pulmonary thromboembolism) ([place])   . Pneumonia   . Sleep apnea    not wearing CPAP   Past Surgical History:  Procedure Laterality Date  . CARDIOVERSION N/A 06/03/2017   Procedure: CARDIOVERSION;  Surgeon: Jerline Pain, MD;  Location: [facility] ENDOSCOPY;  Service: Cardiovascular;  Laterality: N/A;  . COLONOSCOPY    . COLONOSCOPY WITH PROPOFOL N/A 10/20/2018   Procedure: COLONOSCOPY WITH PROPOFOL;  Surgeon: Thornton Park, MD;  Location: [facility] ENDOSCOPY;  Service: Gastroenterology;  Laterality: N/A;  .  HERNIA REPAIR    . KNEE SURGERY    . MULTIPLE TOOTH EXTRACTIONS    . POLYPECTOMY  10/20/2018   Procedure: POLYPECTOMY;  Surgeon: Thornton Park, MD;  Location: [facility] ENDOSCOPY;  Service: Gastroenterology;;  . PROSTATE SURGERY    . RADIOACTIVE SEED IMPLANT    . RIGHT/LEFT HEART CATH AND CORONARY ANGIOGRAPHY N/A 07/06/2017   Procedure: RIGHT/LEFT HEART CATH AND CORONARY ANGIOGRAPHY;  Surgeon: Jettie Booze, MD;  Location: [facility];  Service: Cardiovascular;  Laterality: N/A;  . SHOULDER SURGERY    . TOTAL KNEE ARTHROPLASTY Right 11/19/2016   Procedure: RIGHT TOTAL KNEE ARTHROPLASTY;  Surgeon: Leandrew Koyanagi, MD;  Location: [place];  Service: Orthopedics;  Laterality: Right;  . uretha surgery-2014      Allergies  Allergen Reactions  . Lisinopril Cough    Allergies as of 05/11/2019      Reactions   Lisinopril Cough      Medication List       Accurate as of May 11, 2019  9:51 AM. If you have any questions, ask your nurse or doctor.        STOP taking these medications   Accu-Chek Aviva Plus w/Device Kit Stopped by: Sandrea Hughs, NP   AMBULATORY NON FORMULARY MEDICATION Stopped by: Sandrea Hughs, NP   UNABLE TO FIND Stopped by: Sandrea Hughs, NP     TAKE these medications   Accu-Chek Aviva Soln Use once daily as directed dx E11.22   Accu-Chek Softclix Lancets lancets Use to test blood sugar daily. Dx: E11.22   amLODipine 10 MG tablet Commonly known as: NORVASC TAKE 1 TABLET DAILY FOR HIGH BLOOD PRESSURE   aspirin EC 81 MG tablet Take 81 mg by mouth daily.   atorvastatin 20 MG tablet Commonly known as: LIPITOR TAKE 1 TABLET ONCE DAILY FOR CHOLESTEROL   B-D SINGLE USE SWABS REGULAR Pads Use in testing blood sugar daily. Dx: E11.22   carvedilol 12.5 MG tablet Commonly known as: COREG Take 1 tablet (12.5 mg total) by mouth 2 (two) times daily with a meal.   furosemide 40 MG tablet Commonly known as: LASIX TAKE 1 TABLET EVERY DAY    glipiZIDE 5 MG tablet Commonly known as: GLUCOTROL Take 1 tablet (5 mg total) by mouth daily before breakfast.   glucose blood test strip Commonly known as: Accu-Chek Aviva Plus 1 each by Other route as needed for other. Use to test blood sugar daily. Dx: E11.22   hydrALAZINE 25 MG tablet Commonly known as: APRESOLINE Take 1 tablet (25 mg total) by mouth 3 (three) times daily.   Jardiance 10 MG Tabs tablet Generic drug: empagliflozin TAKE 1 TABLET BY MOUTH ONCE A DAY   losartan 100 MG tablet Commonly known as: COZAAR Take 1 tablet (100 mg total) by mouth daily.   metFORMIN 500 MG tablet Commonly known as: GLUCOPHAGE Take 1 tablet (500 mg total) by mouth 2 (two) times daily with a meal.   pantoprazole 40 MG tablet Commonly known as: PROTONIX Take 1 tablet (40 mg total) by mouth daily.   potassium chloride SA 20 MEQ tablet  Commonly known as: KLOR-CON Take 1 tablet (20 mEq total) by mouth 2 (two) times daily.   rivaroxaban 20 MG Tabs tablet Commonly known as: XARELTO Take 1 tablet (20 mg total) by mouth daily with supper.   traMADol 50 MG tablet Commonly known as: ULTRAM TAKE 1 TABLET (50 MG TOTAL) BY MOUTH EVERY 6 (SIX) HOURS AS NEEDED FOR PAIN       Review of Systems  Constitutional: Negative for appetite change, chills, fatigue and unexpected weight change.  HENT: Negative for congestion, rhinorrhea, sinus pressure, sinus pain, sneezing, sore throat and trouble swallowing.   Eyes: Negative for discharge, redness and itching.  Respiratory: Negative for cough, chest tightness, shortness of breath and wheezing.   Cardiovascular: Negative for chest pain, palpitations and leg swelling.  Gastrointestinal: Negative for abdominal distention, abdominal pain, constipation, diarrhea, nausea and vomiting.       Senokot effective   Endocrine: Negative for cold intolerance, heat intolerance, polydipsia, polyphagia and polyuria.  Genitourinary: Negative for difficulty  urinating, dysuria, hematuria and urgency.       In and out cath   Musculoskeletal: Positive for arthralgias and gait problem. Negative for joint swelling and myalgias.  Skin: Negative for color change, pallor, rash and wound.  Neurological: Negative for dizziness, speech difficulty, weakness, light-headedness, numbness and headaches.  Hematological: Does not bruise/bleed easily.  Psychiatric/Behavioral: Negative for agitation, sleep disturbance and suicidal ideas. The patient is not nervous/anxious.     Immunization History  Administered Date(s) Administered  . Fluad Quad(high Dose 65+) 12/09/2018  . Influenza, High Dose Seasonal PF 12/14/2016  . Influenza,inj,Quad PF,6+ Mos 11/22/2013, 12/05/2014, 10/24/2015  . Influenza-Unspecified 11/29/2017  . Pneumococcal Conjugate-13 05/23/2014  . Pneumococcal Polysaccharide-23 12/13/2012, 05/21/2016   Pertinent  Health Maintenance Due  Topic Date Due  . OPHTHALMOLOGY EXAM  01/18/2018  . FOOT EXAM  12/10/2018  . HEMOGLOBIN A1C  02/15/2019  . COLONOSCOPY  10/19/2028  . INFLUENZA VACCINE  Completed  . PNA vac Low Risk Adult  Completed   Fall Risk  05/11/2019 01/27/2019 12/15/2018 08/18/2018 04/14/2018  Falls in the past year? 0 0 0 0 0  Number falls in past yr: 0 0 0 0 0  Injury with Fall? 0 0 0 0 0  Comment - - - - -    Vitals:   05/11/19 0932  BP: (!) 180/80  Pulse: (!) 58  Temp: 98.1 F (36.7 C)  TempSrc: Oral  SpO2: 98%  Weight: (!) 318 lb (144.2 kg)  Height: '5\' 9"'$  (1.753 m)   Body mass index is 46.96 kg/m. Physical Exam Vitals reviewed.  Constitutional:      General: He is not in acute distress.    Appearance: He is not ill-appearing.  HENT:     Head: Normocephalic.     Right Ear: Tympanic membrane, ear canal and external ear normal. There is no impacted cerumen.     Left Ear: Tympanic membrane, ear canal and external ear normal. There is no impacted cerumen.     Nose: Nose normal. No congestion or rhinorrhea.      Mouth/Throat:     Mouth: Mucous membranes are moist.     Pharynx: Oropharynx is clear. No oropharyngeal exudate or posterior oropharyngeal erythema.  Eyes:     General: No scleral icterus.       Right eye: No discharge.        Left eye: No discharge.     Extraocular Movements: Extraocular movements intact.     Conjunctiva/sclera: Conjunctivae normal.  Pupils: Pupils are equal, round, and reactive to light.  Neck:     Vascular: No carotid bruit.  Cardiovascular:     Rate and Rhythm: Normal rate and regular rhythm.     Pulses: Normal pulses.     Heart sounds: Normal heart sounds. No murmur. No friction rub. No gallop.   Pulmonary:     Effort: Pulmonary effort is normal. No respiratory distress.     Breath sounds: Normal breath sounds. No wheezing, rhonchi or rales.  Chest:     Chest wall: No tenderness.  Abdominal:     General: Bowel sounds are normal. There is no distension.     Palpations: Abdomen is soft. There is no mass.     Tenderness: There is no abdominal tenderness. There is no right CVA tenderness, left CVA tenderness, guarding or rebound.  Musculoskeletal:        General: No swelling or tenderness.     Cervical back: Normal range of motion. No rigidity or tenderness.     Right lower leg: No edema.     Left lower leg: No edema.     Comments: Unsteady gait walks with a cane.   Feet:     Right foot:     Skin integrity: No ulcer, skin breakdown, erythema, warmth or callus.     Left foot:     Skin integrity: No ulcer, skin breakdown, erythema, warmth or callus.  Lymphadenopathy:     Cervical: No cervical adenopathy.  Skin:    General: Skin is warm.     Coloration: Skin is not pale.     Findings: No bruising, erythema or rash.  Neurological:     Mental Status: He is alert and oriented to person, place, and time.     Motor: No weakness.     Coordination: Coordination normal.     Gait: Gait abnormal.  Psychiatric:        Mood and Affect: Mood normal.         Behavior: Behavior normal.        Thought Content: Thought content normal.        Judgment: Judgment normal.    Labs reviewed: Recent Labs    08/16/18 0855  NA 146  K 3.6  CL 107  CO2 28  GLUCOSE 194*  BUN 15  CREATININE 1.04  CALCIUM 8.9   Recent Labs    08/16/18 0855  AST 12  ALT 11  BILITOT 0.6  PROT 6.9   No results for input(s): WBC, NEUTROABS, HGB, HCT, MCV, PLT in the last 8760 hours. Lab Results  Component Value Date   TSH 1.471 06/01/2017   Lab Results  Component Value Date   HGBA1C 7.9 (H) 08/16/2018   Lab Results  Component Value Date   CHOL 148 08/16/2018   HDL 51 08/16/2018   LDLCALC 83 08/16/2018   TRIG 68 08/16/2018   CHOLHDL 2.9 08/16/2018    Significant Diagnostic Results in last 30 days:  No results found.  Assessment/Plan 1. Type 2 diabetes mellitus with chronic kidney disease, without long-term current use of insulin, unspecified CKD stage (HCC) Lab Results  Component Value Date   HGBA1C 7.9 (H) 08/16/2018  - continue on on glipizide 5 mg tablet daily,Metfromin 500 mg tablet twice daily and Jardiance 10 mg tablet daily - dietary and lifestyle modification advised.  - glipiZIDE (GLUCOTROL) 5 MG tablet; Take 1 tablet (5 mg total) by mouth daily before breakfast.  Dispense: 90 tablet; Refill: 1 - Ambulatory  referral to Podiatrist for annual foot exam  Hgb A1C pending   2. Primary osteoarthritis of both knees Continue on tramadol. - traMADol (ULTRAM) 50 MG tablet; Take 1 tablet (50 mg total) by mouth every 6 (six) hours as needed. for pain  Dispense: 30 tablet; Refill: 0  3. Hyperlipidemia with target LDL less than 100 LDL at goal. - continue on atorvastatin 20 mg tablet daily. - continue on dietary and lifestyle modification.  Lipid pending  4. Paroxysmal atrial fibrillation (HCC) Continue on Xarelto 20 mg tablet daily and Carvedilol 12.5 mg tablet twice daily.  5. Chronic systolic heart failure (HCC) No signs of fluid  overload.continue on Furosemide 40 mg tablet daily  - potassium chloride SA (KLOR-CON) 20 MEQ tablet; Take 1 tablet (20 mEq total) by mouth 2 (two) times daily.  Dispense: 180 tablet; Refill: 1  6. Need for Tdap vaccination Due for Tdap vaccine. -  Advised to get Tdap vaccine at your pharmacy prescription send today.  - Tdap ([place]) 5-2.5-18.5 LF-MCG/0.5 injection; Inject 0.5 mLs into the muscle once for 1 dose.  Dispense: 0.5 mL; Refill: 0  Family/ staff Communication: Reviewed plan of care with patient and wife verbalized understanding.  Labs/tests ordered: Advised to get previous ordered labs drawn.    Next Appointment :   Sandrea Hughs, NP

## 2019-05-11 NOTE — Patient Instructions (Signed)
-   Please get Tdap vaccine at your pharmacy prescription send today.  - Referral to podiatrist placed specialist office will call you for appointment

## 2019-05-12 ENCOUNTER — Other Ambulatory Visit: Payer: Self-pay

## 2019-05-12 DIAGNOSIS — I10 Essential (primary) hypertension: Secondary | ICD-10-CM

## 2019-05-12 DIAGNOSIS — E1122 Type 2 diabetes mellitus with diabetic chronic kidney disease: Secondary | ICD-10-CM

## 2019-05-12 DIAGNOSIS — E785 Hyperlipidemia, unspecified: Secondary | ICD-10-CM

## 2019-05-12 LAB — CBC WITH DIFFERENTIAL/PLATELET
Absolute Monocytes: 423 cells/uL (ref 200–950)
Basophils Absolute: 51 cells/uL (ref 0–200)
Basophils Relative: 0.7 %
Eosinophils Absolute: 44 cells/uL (ref 15–500)
Eosinophils Relative: 0.6 %
HCT: 43.1 % (ref 38.5–50.0)
Hemoglobin: 14 g/dL (ref 13.2–17.1)
Lymphs Abs: 1110 cells/uL (ref 850–3900)
MCH: 27.1 pg (ref 27.0–33.0)
MCHC: 32.5 g/dL (ref 32.0–36.0)
MCV: 83.5 fL (ref 80.0–100.0)
MPV: 10.9 fL (ref 7.5–12.5)
Monocytes Relative: 5.8 %
Neutro Abs: 5672 cells/uL (ref 1500–7800)
Neutrophils Relative %: 77.7 %
Platelets: 332 10*3/uL (ref 140–400)
RBC: 5.16 10*6/uL (ref 4.20–5.80)
RDW: 13.8 % (ref 11.0–15.0)
Total Lymphocyte: 15.2 %
WBC: 7.3 10*3/uL (ref 3.8–10.8)

## 2019-05-12 LAB — COMPLETE METABOLIC PANEL WITH GFR
AG Ratio: 1.3 (calc) (ref 1.0–2.5)
ALT: 16 U/L (ref 9–46)
AST: 13 U/L (ref 10–35)
Albumin: 4.2 g/dL (ref 3.6–5.1)
Alkaline phosphatase (APISO): 70 U/L (ref 35–144)
BUN: 9 mg/dL (ref 7–25)
CO2: 26 mmol/L (ref 20–32)
Calcium: 9 mg/dL (ref 8.6–10.3)
Chloride: 105 mmol/L (ref 98–110)
Creat: 1.04 mg/dL (ref 0.70–1.25)
GFR, Est African American: 85 mL/min/{1.73_m2} (ref >=60)
GFR, Est Non African American: 73 mL/min/{1.73_m2} (ref >=60)
Globulin: 3.2 g/dL (calc) (ref 1.9–3.7)
Glucose, Bld: 209 mg/dL — ABNORMAL HIGH (ref 65–99)
Potassium: 3.4 mmol/L — ABNORMAL LOW (ref 3.5–5.3)
Sodium: 144 mmol/L (ref 135–146)
Total Bilirubin: 0.8 mg/dL (ref 0.2–1.2)
Total Protein: 7.4 g/dL (ref 6.1–8.1)

## 2019-05-12 LAB — HEMOGLOBIN A1C
Hgb A1c MFr Bld: 7.2 % of total Hgb — ABNORMAL HIGH (ref <5.7)
Mean Plasma Glucose: 160 (calc)
eAG (mmol/L): 8.9 (calc)

## 2019-05-12 NOTE — Progress Notes (Signed)
1.CBC shows no signs of infection or anemia. 2.Glucose high and Hgb A1C still high continue with your recent dietary modification and exercise. 3. Potassium level slightly low take extra dose of Potassium 20 mg tablet today x 1 dose then continue with Potassium 20 mg tablet twice daily.  4. Renal and liver function are normal.  5 .Recheck CBC/diff,CMP,TSH level,Hgb A1C and fasting lipid panel 2-4 days prior to your 6 months appointment. Please make appointment for the labs.

## 2019-06-01 ENCOUNTER — Ambulatory Visit: Payer: Medicare HMO | Admitting: Podiatry

## 2019-06-05 ENCOUNTER — Other Ambulatory Visit: Payer: Self-pay | Admitting: Nurse Practitioner

## 2019-06-05 DIAGNOSIS — I509 Heart failure, unspecified: Secondary | ICD-10-CM

## 2019-06-05 MED FILL — CARVEDILOL 12.5 MG TABLET: 12.5 | 90 days supply | Qty: 180 | Fill #0

## 2019-06-13 ENCOUNTER — Other Ambulatory Visit: Payer: Self-pay | Admitting: *Deleted

## 2019-06-13 DIAGNOSIS — I509 Heart failure, unspecified: Secondary | ICD-10-CM

## 2019-06-13 DIAGNOSIS — I10 Essential (primary) hypertension: Secondary | ICD-10-CM

## 2019-06-13 DIAGNOSIS — E1122 Type 2 diabetes mellitus with diabetic chronic kidney disease: Secondary | ICD-10-CM

## 2019-06-13 DIAGNOSIS — I5022 Chronic systolic (congestive) heart failure: Secondary | ICD-10-CM

## 2019-06-13 MED ORDER — GLIPIZIDE 5 MG PO TABS: 5.0000 mg | ORAL_TABLET | Freq: Every day | ORAL | 1 refills | Status: DC

## 2019-06-13 MED ORDER — POTASSIUM CHLORIDE CRYS ER 20 MEQ PO TBCR: 20.0000 meq | EXTENDED_RELEASE_TABLET | Freq: Two times a day (BID) | ORAL | 1 refills | Status: DC

## 2019-06-13 MED ORDER — AMLODIPINE BESYLATE 10 MG PO TABS: 10.0000 mg | ORAL_TABLET | Freq: Every day | ORAL | 1 refills | Status: DC

## 2019-06-13 MED ORDER — CARVEDILOL 12.5 MG PO TABS: ORAL_TABLET | ORAL | 1 refills | Status: DC

## 2019-06-13 NOTE — Telephone Encounter (Signed)
Received fax from [facility].  Faxed Rx's.

## 2019-06-15 ENCOUNTER — Other Ambulatory Visit: Payer: Self-pay | Admitting: Nurse Practitioner

## 2019-06-15 ENCOUNTER — Other Ambulatory Visit: Payer: Self-pay | Admitting: Family

## 2019-06-15 DIAGNOSIS — E1122 Type 2 diabetes mellitus with diabetic chronic kidney disease: Secondary | ICD-10-CM

## 2019-06-15 DIAGNOSIS — M170 Bilateral primary osteoarthritis of knee: Secondary | ICD-10-CM

## 2019-06-15 MED FILL — traMADol HCL 50 MG TABS: 50 | 8 days supply | Qty: 30 | Fill #0

## 2019-06-15 MED FILL — JARDIANCE 10 MG TABLET: 10 | 30 days supply | Qty: 30 | Fill #0

## 2019-06-15 NOTE — Telephone Encounter (Signed)
EScribe Request from [facility]: 05/11/2019 Contract updated Pended Rx and sent to [place] for approval.

## 2019-06-21 ENCOUNTER — Other Ambulatory Visit: Payer: Self-pay | Admitting: *Deleted

## 2019-06-21 DIAGNOSIS — I509 Heart failure, unspecified: Secondary | ICD-10-CM

## 2019-06-21 DIAGNOSIS — E1122 Type 2 diabetes mellitus with diabetic chronic kidney disease: Secondary | ICD-10-CM

## 2019-06-21 DIAGNOSIS — I10 Essential (primary) hypertension: Secondary | ICD-10-CM

## 2019-06-21 DIAGNOSIS — I5022 Chronic systolic (congestive) heart failure: Secondary | ICD-10-CM

## 2019-06-21 MED ORDER — CARVEDILOL 12.5 MG PO TABS: ORAL_TABLET | ORAL | 1 refills | Status: DC

## 2019-06-21 MED ORDER — AMLODIPINE BESYLATE 10 MG PO TABS: 10.0000 mg | ORAL_TABLET | Freq: Every day | ORAL | 1 refills | Status: DC

## 2019-06-21 MED ORDER — POTASSIUM CHLORIDE CRYS ER 20 MEQ PO TBCR: 20.0000 meq | EXTENDED_RELEASE_TABLET | Freq: Two times a day (BID) | ORAL | 1 refills | Status: DC

## 2019-06-21 MED ORDER — GLIPIZIDE 5 MG PO TABS: 5.0000 mg | ORAL_TABLET | Freq: Every day | ORAL | 1 refills | Status: DC

## 2019-06-21 NOTE — Telephone Encounter (Signed)
[facility] 

## 2019-07-24 ENCOUNTER — Other Ambulatory Visit: Payer: Self-pay | Admitting: Nurse Practitioner

## 2019-07-24 DIAGNOSIS — E1122 Type 2 diabetes mellitus with diabetic chronic kidney disease: Secondary | ICD-10-CM

## 2019-07-28 ENCOUNTER — Ambulatory Visit (INDEPENDENT_AMBULATORY_CARE_PROVIDER_SITE_OTHER): Payer: Medicare HMO | Admitting: Nurse Practitioner

## 2019-07-28 ENCOUNTER — Other Ambulatory Visit: Payer: Self-pay

## 2019-07-28 ENCOUNTER — Encounter: Payer: Self-pay | Admitting: Nurse Practitioner

## 2019-07-28 VITALS — BP 154/80 | HR 74 | Temp 96.9°F | Ht 69.0 in | Wt 325.0 lb

## 2019-07-28 DIAGNOSIS — I10 Essential (primary) hypertension: Secondary | ICD-10-CM

## 2019-07-28 DIAGNOSIS — C61 Malignant neoplasm of prostate: Secondary | ICD-10-CM

## 2019-07-28 DIAGNOSIS — K219 Gastro-esophageal reflux disease without esophagitis: Secondary | ICD-10-CM

## 2019-07-28 DIAGNOSIS — I2602 Saddle embolus of pulmonary artery with acute cor pulmonale: Secondary | ICD-10-CM

## 2019-07-28 DIAGNOSIS — I2782 Chronic pulmonary embolism: Secondary | ICD-10-CM

## 2019-07-28 MED ORDER — PANTOPRAZOLE SODIUM 40 MG PO TBEC: 40.0000 mg | DELAYED_RELEASE_TABLET | Freq: Every day | ORAL | 1 refills | Status: DC

## 2019-07-28 MED ORDER — HYDRALAZINE HCL 50 MG PO TABS: 50.0000 mg | ORAL_TABLET | Freq: Three times a day (TID) | ORAL | 1 refills | Status: DC

## 2019-07-28 NOTE — Progress Notes (Signed)
Careteam: Patient Care Team: [name], NP as PCP - General (Geriatric Medicine) Jettie Booze, MD as PCP - Cardiology (Cardiology) Katy Fitch, Darlina Guys, MD as Consulting Physician (Ophthalmology)  PLACE OF SERVICE:  [place]  Advanced Directive information    Allergies  Allergen Reactions  . Lisinopril Cough    Chief Complaint  Patient presents with  . Acute Visit    Elevated blood pressure: patient checks b/p with home monitor and is getting elevated vaules, one vaule was 187/104   . Medication Refill    Refill protonix at mail order      HPI: Patient is a 69 y.o. male due to elevated blood pressure.  In May his wife got a kidney transplant, he has been back and forth from [facility] and daughters preparing his food.  A little more pressure than normal.  Daughters trying to get him to eat low sodium diet On losartan 100 mg, norvasc 10 mg, hydralazine 25 mg TID, carvedilol 12.5 mg twice daily.  Blood pressure as high as 187/104. Some pressure in the head and dizziness.  No chest pains or worsening shortness of breath- had COVID in March and it has not been 100% better since.   Review of Systems:  Review of Systems  Constitutional: Negative for chills, fever and weight loss.  HENT: Negative for tinnitus.   Respiratory: Negative for cough, sputum production and shortness of breath.   Cardiovascular: Negative for chest pain, palpitations and leg swelling.  Gastrointestinal: Negative for abdominal pain, constipation, diarrhea and heartburn.  Genitourinary: Negative for dysuria, frequency and urgency.  Musculoskeletal: Negative for back pain, falls, joint pain and myalgias.  Skin: Negative.   Neurological: Positive for dizziness and headaches.   Past Medical History:  Diagnosis Date  . Acute on chronic diastolic CHF (congestive heart failure) ([place]) 06/01/2017  . Arthritis   . Cancer [facility]) 2010   Prostate  . Chronic kidney disease   . Diabetes  mellitus   . Diabetic neuropathy (HCC)    feet  . DVT (deep venous thrombosis) ([place])   . Genetic testing 06/22/2016   Mr. Franzoni underwent genetic counseling and testing for hereditary cancer syndromes on 05/14/2016. His results were negative for mutations in all 46 genes analyzed by [facility]'s 46-gene Common Hereditary Cancers Panel. Genes analyzed include: APC, ATM, AXIN2, BARD1, BMPR1A, BRCA1, BRCA2, BRIP1, CDH1, CDKN2A, CHEK2, CTNNA1, DICER1, EPCAM, GREM1, HOXB13, KIT, MEN1, MLH1, MSH2, MSH3, MSH6, MUTYH, NB  . GERD (gastroesophageal reflux disease)   . Hypertension   . PE (pulmonary thromboembolism) ([place])   . Pneumonia   . Sleep apnea    not wearing CPAP   Past Surgical History:  Procedure Laterality Date  . CARDIOVERSION N/A 06/03/2017   Procedure: CARDIOVERSION;  Surgeon: Jerline Pain, MD;  Location: [facility] ENDOSCOPY;  Service: Cardiovascular;  Laterality: N/A;  . COLONOSCOPY    . COLONOSCOPY WITH PROPOFOL N/A 10/20/2018   Procedure: COLONOSCOPY WITH PROPOFOL;  Surgeon: Thornton Park, MD;  Location: [facility] ENDOSCOPY;  Service: Gastroenterology;  Laterality: N/A;  . HERNIA REPAIR    . KNEE SURGERY    . MULTIPLE TOOTH EXTRACTIONS    . POLYPECTOMY  10/20/2018   Procedure: POLYPECTOMY;  Surgeon: Thornton Park, MD;  Location: [facility] ENDOSCOPY;  Service: Gastroenterology;;  . PROSTATE SURGERY    . RADIOACTIVE SEED IMPLANT    . RIGHT/LEFT HEART CATH AND CORONARY ANGIOGRAPHY N/A 07/06/2017   Procedure: RIGHT/LEFT HEART CATH AND CORONARY ANGIOGRAPHY;  Surgeon: Jettie Booze, MD;  Location: [place] CV LAB;  Service: Cardiovascular;  Laterality: N/A;  . SHOULDER SURGERY    . TOTAL KNEE ARTHROPLASTY Right 11/19/2016   Procedure: RIGHT TOTAL KNEE ARTHROPLASTY;  Surgeon: Leandrew Koyanagi, MD;  Location: [place];  Service: Orthopedics;  Laterality: Right;  . uretha surgery-2014     Social History:   reports that he has never smoked. He has never used smokeless tobacco. He reports that he does  not drink alcohol and does not use drugs.  Family History  Problem Relation Age of Onset  . Breast cancer Mother 71       d.89  . Breast cancer Sister 76       d.30  . Leukemia Brother 18       d.20  . Breast cancer Maternal Aunt 40       d.40s  . Lung cancer Maternal Uncle   . Prostate cancer Paternal Uncle   . Prostate cancer Brother        recurred recently at age 79  . Other Brother 15       spinal tumor  . Cervical cancer Other 22       d.22  . Cancer Sister 36       unspecified type  . Cancer Maternal Uncle 80       unspecified type    Medications: Patient's Medications  New Prescriptions   No medications on file  Previous Medications   ACCU-CHEK SOFTCLIX LANCETS LANCETS    Use to test blood sugar daily. Dx: E11.22   ALCOHOL SWABS (B-D SINGLE USE SWABS REGULAR) PADS    Use in testing blood sugar daily. Dx: E11.22   AMLODIPINE (NORVASC) 10 MG TABLET    Take 1 tablet (10 mg total) by mouth daily. for high blood pressure   ASPIRIN EC 81 MG TABLET    Take 81 mg by mouth daily.   ATORVASTATIN (LIPITOR) 20 MG TABLET    TAKE 1 TABLET ONCE DAILY FOR CHOLESTEROL   BLOOD GLUCOSE CALIBRATION (ACCU-CHEK AVIVA) SOLN    Use once daily as directed dx E11.22   CARVEDILOL (COREG) 12.5 MG TABLET    Take one tablet by mouth twice daily with a meal   ELDERBERRY PO    Take 1 tablet by mouth daily.   FUROSEMIDE (LASIX) 40 MG TABLET    TAKE 1 TABLET EVERY DAY   GLIPIZIDE (GLUCOTROL) 5 MG TABLET    Take 1 tablet (5 mg total) by mouth daily before breakfast.   GLUCOSE BLOOD (ACCU-CHEK AVIVA PLUS) TEST STRIP    1 each by Other route as needed for other. Use to test blood sugar daily. Dx: E11.22   HYDRALAZINE (APRESOLINE) 25 MG TABLET    Take 1 tablet (25 mg total) by mouth 3 (three) times daily.   JARDIANCE 10 MG TABS TABLET    TAKE 1 TABLET BY MOUTH ONCE A DAY   LOSARTAN (COZAAR) 100 MG TABLET    Take 1 tablet (100 mg total) by mouth daily.   METFORMIN (GLUCOPHAGE) 500 MG TABLET    Take 1  tablet (500 mg total) by mouth 2 (two) times daily with a meal.   MULTIPLE VITAMINS-MINERALS (EMERGEN-C VITAMIN C PO)    Take 1 tablet by mouth daily.   PANTOPRAZOLE (PROTONIX) 40 MG TABLET    Take 1 tablet (40 mg total) by mouth daily.   POTASSIUM CHLORIDE SA (KLOR-CON) 20 MEQ TABLET    Take 1 tablet (20 mEq total) by mouth 2 (  two) times daily.   RIVAROXABAN (XARELTO) 20 MG TABS TABLET    Take 1 tablet (20 mg total) by mouth daily with supper.   SENNA 8.7 MG CHEW    Chew by mouth as needed.   TRAMADOL (ULTRAM) 50 MG TABLET    TAKE 1 TABLET BY MOUTH EVERY 6 HOURS AS NEEDED FOR PAIN   VITAMIN B-12 (CYANOCOBALAMIN) 500 MCG TABLET    Take 500 mcg by mouth daily.  Modified Medications   No medications on file  Discontinued Medications   No medications on file    Physical Exam:  Vitals:   07/28/19 1551  BP: (!) 154/80  Pulse: 74  Temp: (!) 96.9 F (36.1 C)  TempSrc: Temporal  SpO2: 96%  Weight: (!) 325 lb (147.4 kg)  Height: '5\' 9"'$  (1.753 m)   Body mass index is 47.99 kg/m. Wt Readings from Last 3 Encounters:  07/28/19 (!) 325 lb (147.4 kg)  05/11/19 (!) 318 lb (144.2 kg)  10/20/18 (!) 330 lb (149.7 kg)    Physical Exam Constitutional:      Appearance: He is obese.  HENT:     Head: Normocephalic and atraumatic.  Cardiovascular:     Rate and Rhythm: Normal rate and regular rhythm.     Pulses: Normal pulses.  Pulmonary:     Effort: Pulmonary effort is normal.     Breath sounds: Normal breath sounds.  Abdominal:     General: Abdomen is protuberant.  Musculoskeletal:     Cervical back: Normal range of motion and neck supple.  Skin:    General: Skin is warm and dry.  Neurological:     Mental Status: He is alert and oriented to person, place, and time.  Psychiatric:        Mood and Affect: Mood normal.        Behavior: Behavior normal.     Labs reviewed: Basic Metabolic Panel: Recent Labs    08/16/18 0855 05/11/19 1024  NA 146 144  K 3.6 3.4*  CL 107 105  CO2  28 26  GLUCOSE 194* 209*  BUN 15 9  CREATININE 1.04 1.04  CALCIUM 8.9 9.0   Liver Function Tests: Recent Labs    08/16/18 0855 05/11/19 1024  AST 12 13  ALT 11 16  BILITOT 0.6 0.8  PROT 6.9 7.4   No results for input(s): LIPASE, AMYLASE in the last 8760 hours. No results for input(s): AMMONIA in the last 8760 hours. CBC: Recent Labs    05/11/19 1024  WBC 7.3  NEUTROABS 5,672  HGB 14.0  HCT 43.1  MCV 83.5  PLT 332   Lipid Panel: Recent Labs    08/16/18 0855  CHOL 148  HDL 51  LDLCALC 83  TRIG 68  CHOLHDL 2.9   TSH: No results for input(s): TSH in the last 8760 hours. A1C: Lab Results  Component Value Date   HGBA1C 7.2 (H) 05/11/2019     Assessment/Plan 1. Gastroesophageal reflux disease without esophagitis Stable, refill provided.  - pantoprazole (PROTONIX) 40 MG tablet; Take 1 tablet (40 mg total) by mouth daily.  Dispense: 90 tablet; Refill: 1  2. Essential hypertension -elevated blood pressure on multiple occasions, pt continues to have a low sodium diet but not as active, has gained weight. Continues on losartan, coreg, norvasc. Will increase hydralazine to 50 mg three times daily  - hydrALAZINE (APRESOLINE) 50 MG tablet; Take 1 tablet (50 mg total) by mouth 3 (three) times daily.  Dispense: 180 tablet; Refill:  1  3. 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   4. Severe obesity (BMI >= 40) (HCC) Ongoing, discussed need for weight loss through diet and increase in physical activity/exercise.   5. Malignant neoplasm of prostate ([place]) Stable, ongoing follow up with urologist. s/p radioactive seed implant now requiring I&O cath for urination   Next appt: 4 weeks for blood pressure check Jessica K. Dewy Rose, [facility] [phone]

## 2019-07-28 NOTE — Patient Instructions (Signed)
Increase hydralazine to 50 mg three times daily Continue to check blood pressure at home- AFTER medication, make sure you have been sitting for at least 5 minutes   DASH Eating Plan DASH stands for "Dietary Approaches to Stop Hypertension." The DASH eating plan is a healthy eating plan that has been shown to reduce high blood pressure (hypertension). It may also reduce your risk for type 2 diabetes, heart disease, and stroke. The DASH eating plan may also help with weight loss. What are tips for following this plan?  General guidelines  Avoid eating more than 2,300 mg (milligrams) of salt (sodium) a day. If you have hypertension, you may need to reduce your sodium intake to 1,500 mg a day.  Limit alcohol intake to no more than 1 drink a day for nonpregnant women and 2 drinks a day for men. One drink equals 12 oz of beer, 5 oz of wine, or 1 oz of hard liquor.  Work with your health care provider to maintain a healthy body weight or to lose weight. Ask what an ideal weight is for you.  Get at least 30 minutes of exercise that causes your heart to beat faster (aerobic exercise) most days of the week. Activities may include walking, swimming, or biking.  Work with your health care provider or diet and nutrition specialist (dietitian) to adjust your eating plan to your individual calorie needs. Reading food labels   Check food labels for the amount of sodium per serving. Choose foods with less than 5 percent of the Daily Value of sodium. Generally, foods with less than 300 mg of sodium per serving fit into this eating plan.  To find whole grains, look for the word "whole" as the first word in the ingredient list. Shopping  Buy products labeled as "low-sodium" or "no salt added."  Buy fresh foods. Avoid canned foods and premade or frozen meals. Cooking  Avoid adding salt when cooking. Use salt-free seasonings or herbs instead of table salt or sea salt. Check with your health care provider  or pharmacist before using salt substitutes.  Do not fry foods. Cook foods using healthy methods such as baking, boiling, grilling, and broiling instead.  Cook with heart-healthy oils, such as olive, canola, soybean, or sunflower oil. Meal planning  Eat a balanced diet that includes: ? 5 or more servings of fruits and vegetables each day. At each meal, try to fill half of your plate with fruits and vegetables. ? Up to 6-8 servings of whole grains each day. ? Less than 6 oz of lean meat, poultry, or fish each day. A 3-oz serving of meat is about the same size as a deck of cards. One egg equals 1 oz. ? 2 servings of low-fat dairy each day. ? A serving of nuts, seeds, or beans 5 times each week. ? Heart-healthy fats. Healthy fats called Omega-3 fatty acids are found in foods such as flaxseeds and coldwater fish, like sardines, salmon, and mackerel.  Limit how much you eat of the following: ? Canned or prepackaged foods. ? Food that is high in trans fat, such as fried foods. ? Food that is high in saturated fat, such as fatty meat. ? Sweets, desserts, sugary drinks, and other foods with added sugar. ? Full-fat dairy products.  Do not salt foods before eating.  Try to eat at least 2 vegetarian meals each week.  Eat more home-cooked food and less restaurant, buffet, and fast food.  When eating at a restaurant, ask that  your food be prepared with less salt or no salt, if possible. What foods are recommended? The items listed may not be a complete list. Talk with your dietitian about what dietary choices are best for you. Grains Whole-grain or whole-wheat bread. Whole-grain or whole-wheat pasta. Brown rice. [place]row. Bulgur. Whole-grain and low-sodium cereals. Pita bread. Low-fat, low-sodium crackers. Whole-wheat flour tortillas. Vegetables Fresh or frozen vegetables (raw, steamed, roasted, or grilled). Low-sodium or reduced-sodium tomato and vegetable juice. Low-sodium or  reduced-sodium tomato sauce and tomato paste. Low-sodium or reduced-sodium canned vegetables. Fruits All fresh, dried, or frozen fruit. Canned fruit in natural juice (without added sugar). Meat and other protein foods Skinless chicken or [place]. Ground chicken or [place]. Pork with fat trimmed off. Fish and seafood. Egg whites. Dried beans, peas, or lentils. Unsalted nuts, nut butters, and seeds. Unsalted canned beans. Lean cuts of beef with fat trimmed off. Low-sodium, lean deli meat. Dairy Low-fat (1%) or fat-free (skim) milk. Fat-free, low-fat, or reduced-fat cheeses. Nonfat, low-sodium ricotta or cottage cheese. Low-fat or nonfat yogurt. Low-fat, low-sodium cheese. Fats and oils Soft margarine without trans fats. Vegetable oil. Low-fat, reduced-fat, or light mayonnaise and salad dressings (reduced-sodium). Canola, safflower, olive, soybean, and sunflower oils. Avocado. Seasoning and other foods Herbs. Spices. Seasoning mixes without salt. Unsalted popcorn and pretzels. Fat-free sweets. What foods are not recommended? The items listed may not be a complete list. Talk with your dietitian about what dietary choices are best for you. Grains Baked goods made with fat, such as croissants, muffins, or some breads. Dry pasta or rice meal packs. Vegetables Creamed or fried vegetables. Vegetables in a cheese sauce. Regular canned vegetables (not low-sodium or reduced-sodium). Regular canned tomato sauce and paste (not low-sodium or reduced-sodium). Regular tomato and vegetable juice (not low-sodium or reduced-sodium). Angie Fava. Olives. Fruits Canned fruit in a light or heavy syrup. Fried fruit. Fruit in cream or butter sauce. Meat and other protein foods Fatty cuts of meat. Ribs. Fried meat. Berniece Salines. Sausage. Bologna and other processed lunch meats. Salami. Fatback. Hotdogs. Bratwurst. Salted nuts and seeds. Canned beans with added salt. Canned or smoked fish. Whole eggs or egg yolks. Chicken or [place]  with skin. Dairy Whole or 2% milk, cream, and half-and-half. Whole or full-fat cream cheese. Whole-fat or sweetened yogurt. Full-fat cheese. Nondairy creamers. Whipped toppings. Processed cheese and cheese spreads. Fats and oils Butter. Stick margarine. Lard. Shortening. Ghee. Bacon fat. Tropical oils, such as coconut, palm kernel, or palm oil. Seasoning and other foods Salted popcorn and pretzels. Onion salt, garlic salt, seasoned salt, table salt, and sea salt. Worcestershire sauce. Tartar sauce. Barbecue sauce. Teriyaki sauce. Soy sauce, including reduced-sodium. Steak sauce. Canned and packaged gravies. Fish sauce. Oyster sauce. Cocktail sauce. Horseradish that you find on the shelf. Ketchup. Mustard. Meat flavorings and tenderizers. Bouillon cubes. Hot sauce and Tabasco sauce. Premade or packaged marinades. Premade or packaged taco seasonings. Relishes. Regular salad dressings. Where to find more information:  National Heart, Lung, and [place]: https://[place].com/  American Heart Association: www.heart.org Summary  The DASH eating plan is a healthy eating plan that has been shown to reduce high blood pressure (hypertension). It may also reduce your risk for type 2 diabetes, heart disease, and stroke.  With the DASH eating plan, you should limit salt (sodium) intake to 2,300 mg a day. If you have hypertension, you may need to reduce your sodium intake to 1,500 mg a day.  When on the DASH eating plan, aim to eat more fresh fruits and  vegetables, whole grains, lean proteins, low-fat dairy, and heart-healthy fats.  Work with your health care provider or diet and nutrition specialist (dietitian) to adjust your eating plan to your individual calorie needs. This information is not intended to replace advice given to you by your health care provider. Make sure you discuss any questions you have with your health care provider. Document Revised: 01/15/2017 Document Reviewed: 01/27/2016  Elsevier Patient Education  2020 [employer].

## 2019-08-07 ENCOUNTER — Other Ambulatory Visit: Payer: Self-pay | Admitting: Nurse Practitioner

## 2019-08-07 DIAGNOSIS — M170 Bilateral primary osteoarthritis of knee: Secondary | ICD-10-CM

## 2019-08-07 MED FILL — traMADol HCL 50 MG TABS: 50 | 8 days supply | Qty: 30 | Fill #0

## 2019-08-07 NOTE — Telephone Encounter (Signed)
Received refill request from pharmacy Pended Rx and sent to [facility] for approval Epic LR: 06/15/19

## 2019-08-14 ENCOUNTER — Other Ambulatory Visit: Payer: Self-pay | Admitting: Nurse Practitioner

## 2019-08-14 DIAGNOSIS — E1122 Type 2 diabetes mellitus with diabetic chronic kidney disease: Secondary | ICD-10-CM

## 2019-08-14 MED FILL — METFORMIN HCL 500 MG TABS: 500 | 90 days supply | Qty: 180 | Fill #0

## 2019-08-24 ENCOUNTER — Other Ambulatory Visit: Payer: Self-pay | Admitting: Nurse Practitioner

## 2019-08-24 DIAGNOSIS — I5042 Chronic combined systolic (congestive) and diastolic (congestive) heart failure: Secondary | ICD-10-CM

## 2019-08-24 DIAGNOSIS — E1122 Type 2 diabetes mellitus with diabetic chronic kidney disease: Secondary | ICD-10-CM

## 2019-08-24 MED ORDER — FUROSEMIDE 40 MG PO TABS: 40.0000 mg | ORAL_TABLET | Freq: Every day | ORAL | 1 refills | Status: DC

## 2019-08-24 MED FILL — FUROSEMIDE 40 MG TAB: 40 | 90 days supply | Qty: 90 | Fill #0

## 2019-08-24 NOTE — Telephone Encounter (Signed)
Rx was filled on 6/28/201 at local pharmacy: [facility]  Spoke with patient, patient is ok with keeping RX at [facility]  Patient also requested a refill for furosemide

## 2019-08-25 ENCOUNTER — Other Ambulatory Visit: Payer: Self-pay | Admitting: *Deleted

## 2019-08-25 MED ORDER — BD SWAB SINGLE USE REGULAR PADS: MEDICATED_PAD | 3 refills | Status: DC

## 2019-08-25 MED ORDER — ACCU-CHEK AVIVA VI SOLN: 2 refills | Status: DC

## 2019-08-25 NOTE — Telephone Encounter (Signed)
Received refill Request from [facility] 

## 2019-08-30 IMAGING — CT CT HEAD W/O CM
1 series · 16 of 30 positions shown, 20 images · non-contrast
Comparison: 05/19/2014

CLINICAL DATA: Vertigo

EXAM:
CT HEAD WITHOUT CONTRAST
TECHNIQUE: Contiguous axial images were obtained from the base of the skull
through the vertex without intravenous contrast.

[Series 2: head w/(date) · axial · 0.41mm/px · z∈[-166,-31]mm · 16 of 31 slices shown, 20 images]
[im 2/31  brain]
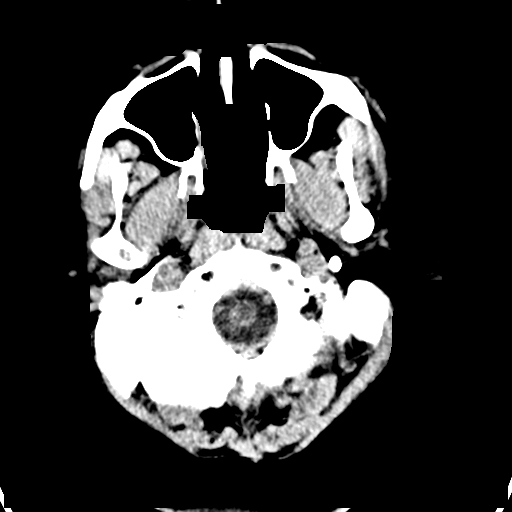
[im 2/31  bone]
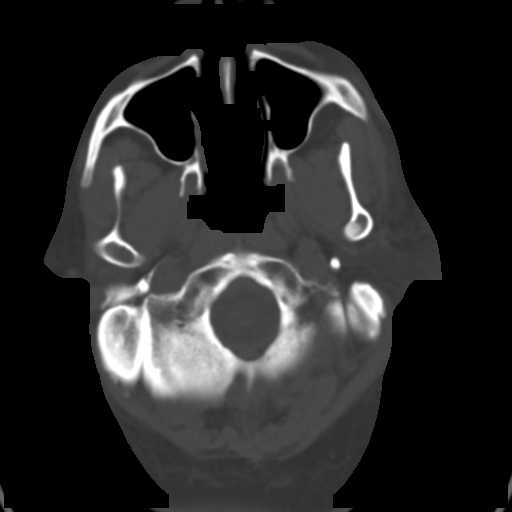
[im 4/31  brain]
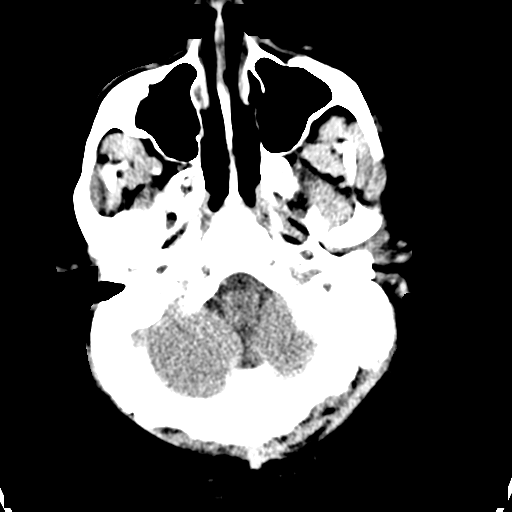
[im 6/31  brain]
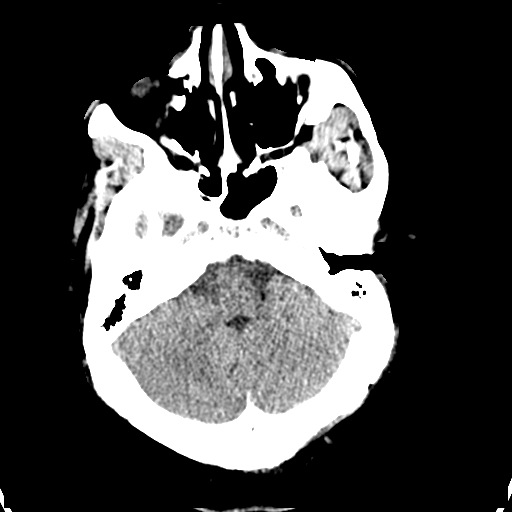
[im 8/31  brain]
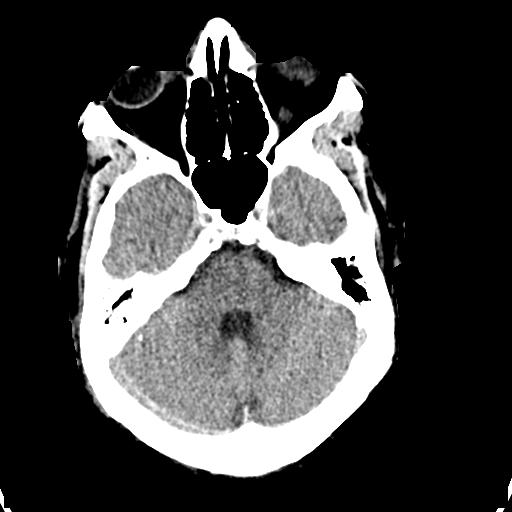
[im 9/31  brain]
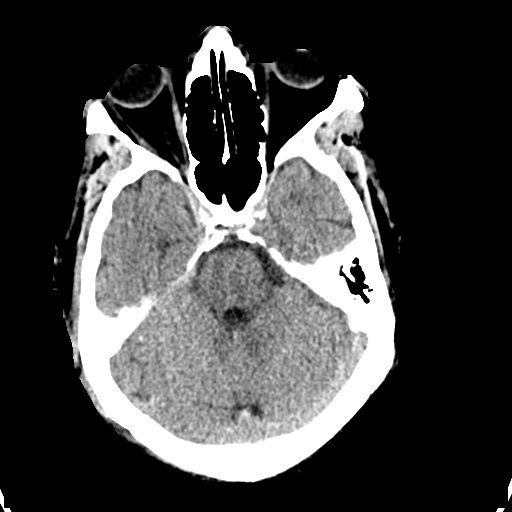
[im 9/31  bone]
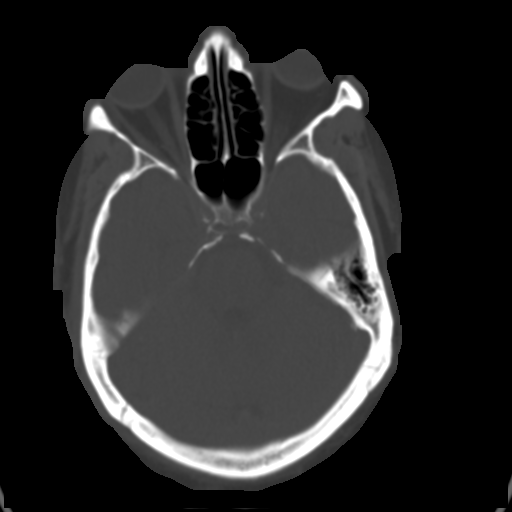
[im 11/31  brain]
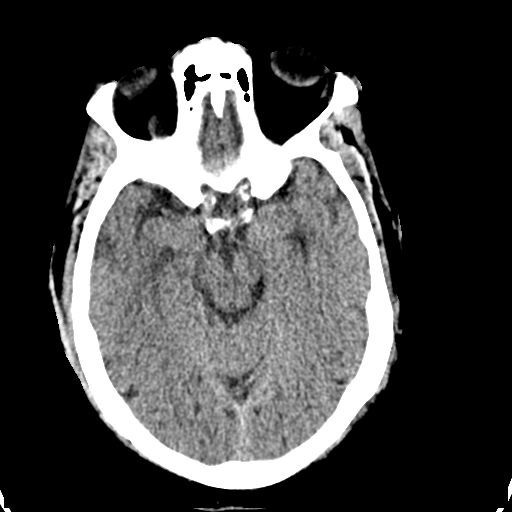
[im 13/31  brain]
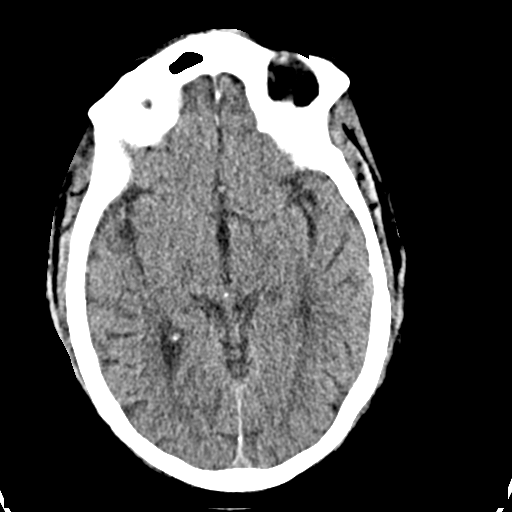
[im 15/31  brain]
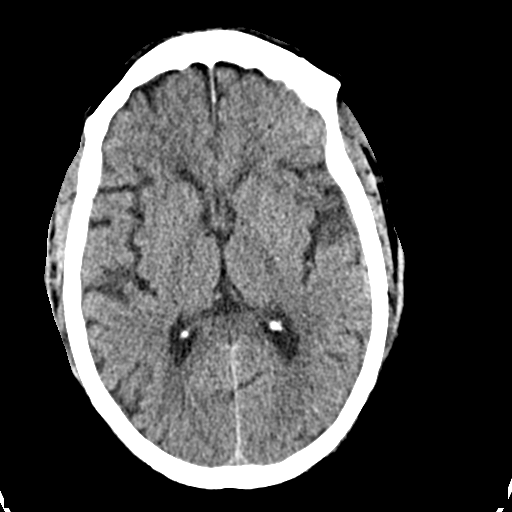
[im 16/31  brain]
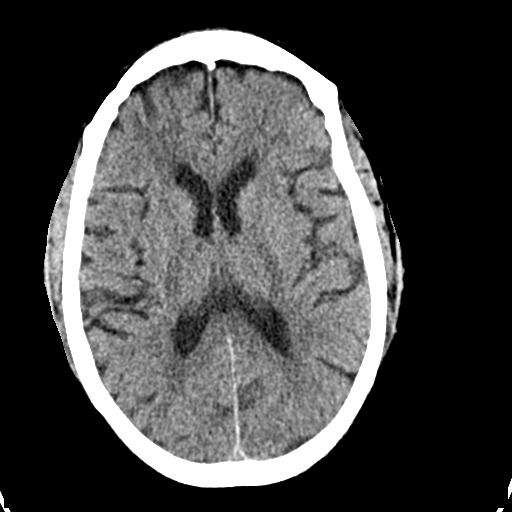
[im 16/31  bone]
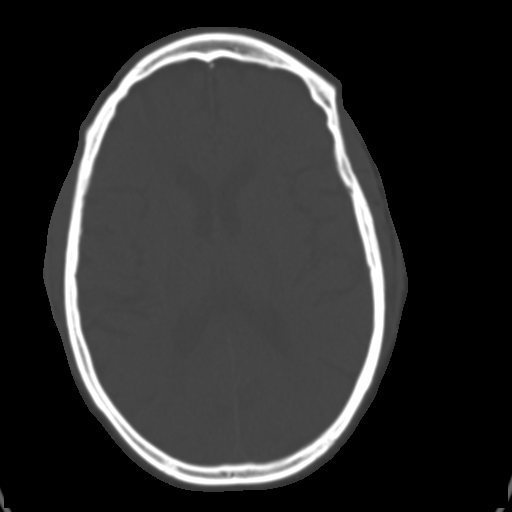
[im 18/31  brain]
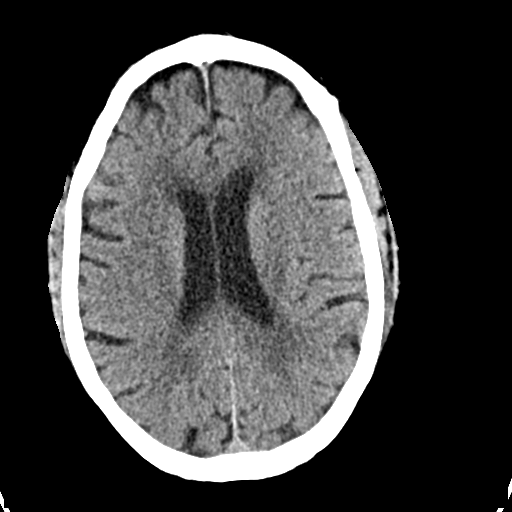
[im 20/31  brain]
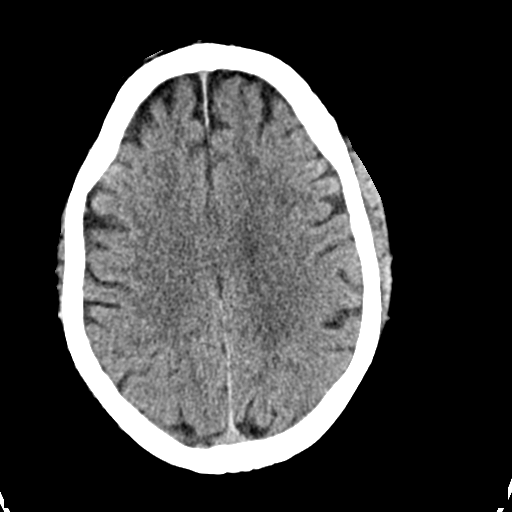
[im 22/31  brain]
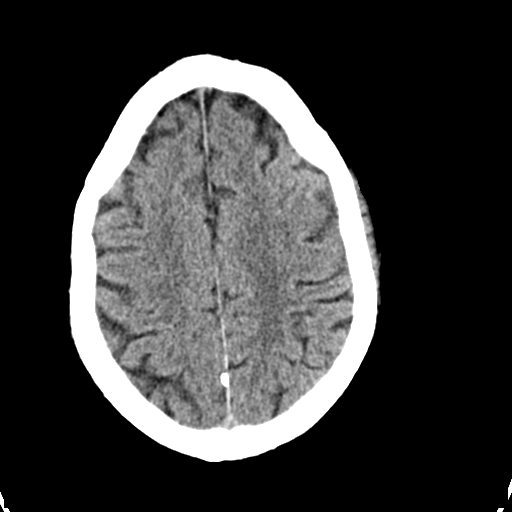
[im 23/31  brain]
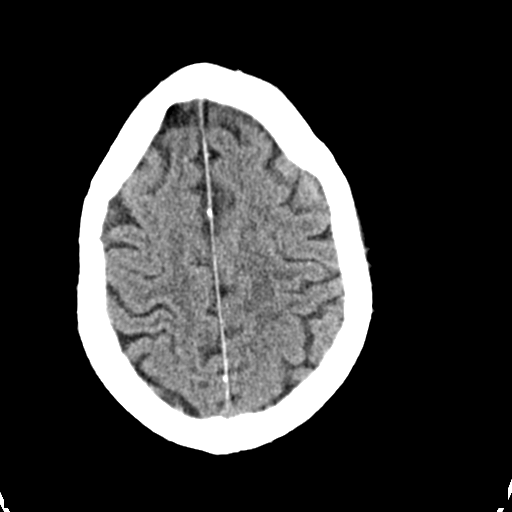
[im 23/31  bone]
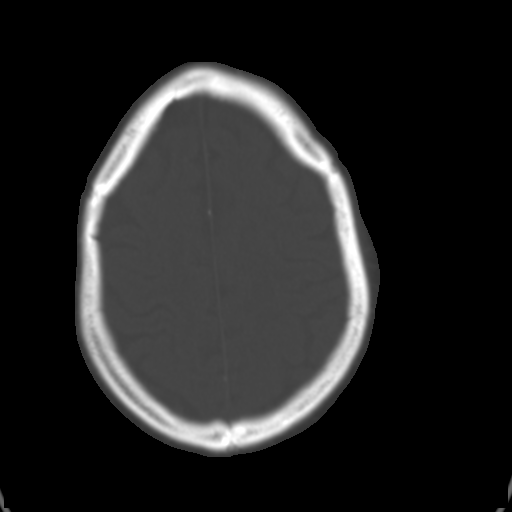
[im 25/31  brain]
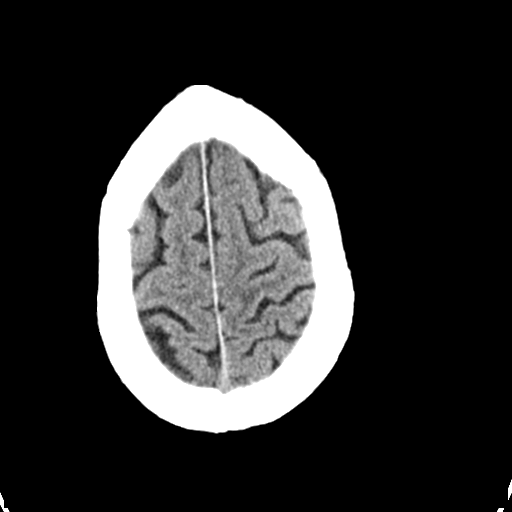
[im 27/31  brain]
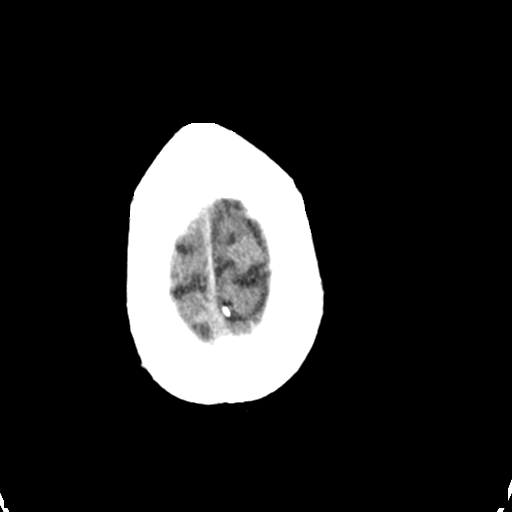
[im 29/31  brain]
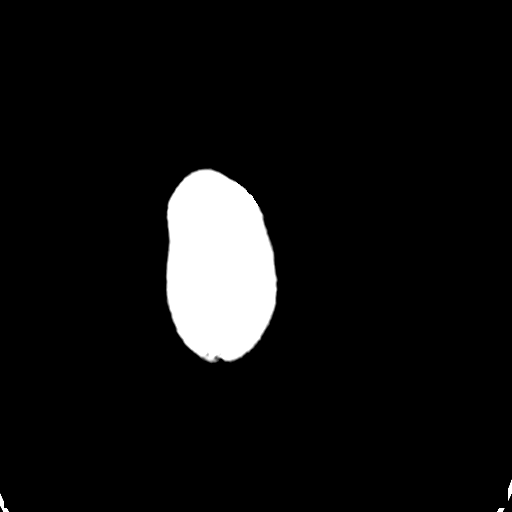

[16 of 30 positions shown; findings below may reference images not displayed]

FINDINGS: Brain: No acute intracranial abnormality. Specifically, no
hemorrhage, hydrocephalus, mass lesion, acute infarction, or
significant intracranial injury.

Vascular: No hyperdense vessel or unexpected calcification.

Skull: No acute calvarial abnormality.

Sinuses/Orbits: Visualized paranasal sinuses and mastoids clear.
Orbital soft tissues unremarkable.

Other: None
IMPRESSION: No acute intracranial abnormality.

## 2019-09-05 ENCOUNTER — Telehealth: Payer: Self-pay | Admitting: *Deleted

## 2019-09-05 NOTE — Telephone Encounter (Signed)
Patient called and stated that he is having a deep pain on the right side affecting him getting up and walking. No other symptoms.  Stated that it was discussed at his last appointment on 07/28/19.  Patient is requesting an X-Ray to be done.  (no available appointments in office today)  Please Advise.

## 2019-09-05 NOTE — Telephone Encounter (Signed)
Would really recommend evaluation before xray was ordered, see if he can get in tomorrow If he can not wait would recommend urgent care

## 2019-09-05 NOTE — Telephone Encounter (Signed)
Patient notified and agreed. Appointment scheduled with Janett Billow for 09/06/19 at 8:30

## 2019-09-06 ENCOUNTER — Other Ambulatory Visit: Payer: Self-pay | Admitting: Nurse Practitioner

## 2019-09-06 ENCOUNTER — Ambulatory Visit (INDEPENDENT_AMBULATORY_CARE_PROVIDER_SITE_OTHER): Payer: Medicare HMO | Admitting: Nurse Practitioner

## 2019-09-06 ENCOUNTER — Encounter: Payer: Self-pay | Admitting: Nurse Practitioner

## 2019-09-06 ENCOUNTER — Other Ambulatory Visit: Payer: Self-pay

## 2019-09-06 VITALS — BP 144/90 | HR 65 | Temp 96.0°F | Ht 69.0 in | Wt 320.2 lb

## 2019-09-06 DIAGNOSIS — T148XXA Other injury of unspecified body region, initial encounter: Secondary | ICD-10-CM | POA: Diagnosis not present

## 2019-09-06 DIAGNOSIS — E785 Hyperlipidemia, unspecified: Secondary | ICD-10-CM | POA: Diagnosis not present

## 2019-09-06 DIAGNOSIS — I10 Essential (primary) hypertension: Secondary | ICD-10-CM

## 2019-09-06 DIAGNOSIS — E1122 Type 2 diabetes mellitus with diabetic chronic kidney disease: Secondary | ICD-10-CM | POA: Diagnosis not present

## 2019-09-06 MED ORDER — EMPAGLIFLOZIN 10 MG PO TABS: 10.0000 mg | ORAL_TABLET | Freq: Every day | ORAL | 5 refills | Status: DC

## 2019-09-06 MED ORDER — CYCLOBENZAPRINE HCL 5 MG PO TABS: 5.0000 mg | ORAL_TABLET | Freq: Three times a day (TID) | ORAL | 1 refills | Status: DC | PRN

## 2019-09-06 MED ORDER — HYDRALAZINE HCL 50 MG PO TABS: 75.0000 mg | ORAL_TABLET | Freq: Three times a day (TID) | ORAL | 1 refills | Status: DC

## 2019-09-06 MED FILL — CYCLOBENZAPRINE HCL 5 MG TA: 5 | 10 days supply | Qty: 30 | Fill #0

## 2019-09-06 MED FILL — JARDIANCE 10 MG TABLET: 10 | 30 days supply | Qty: 30 | Fill #0

## 2019-09-06 NOTE — Progress Notes (Signed)
Careteam: Patient Care Team: [name], NP as PCP - General (Geriatric Medicine) Jettie Booze, MD as PCP - Cardiology (Cardiology) Katy Fitch, Darlina Guys, MD as Consulting Physician (Ophthalmology)  PLACE OF SERVICE:  [place]  Advanced Directive information    Allergies  Allergen Reactions  . Lisinopril Cough    Chief Complaint  Patient presents with  . Acute Visit    Ongoing right side pain   . Medication Refill    Renew Jardiance      HPI: Patient is a 69 y.o. male for ongoing right side pain.  Has been going on for about 6 weeks, getting worse. Feels like he is leaning to the side when he is sitting.  Thought it would be getting better but it has not.  Using 2 extra stength tylenol  No known injury but started working out more with weights and bar and started noticing pain after that so he stopped lifting weights.   Reports blood pressure remains elevated at home sbp 150-160. Continues to be back and forth to appts with wife. Tolerating hydralazine 50 mg TID, also taking losartan and norvasc.  Review of Systems:  Review of Systems  Constitutional: Negative for chills, fever and malaise/fatigue.  Respiratory: Negative for shortness of breath.   Cardiovascular: Negative for chest pain and leg swelling.  Genitourinary: Negative for dysuria.  Musculoskeletal: Positive for joint pain (knee pain) and myalgias. Negative for back pain, falls and neck pain.  Skin: Negative for itching and rash.  Neurological: Negative for tingling, sensory change, weakness and headaches.    Past Medical History:  Diagnosis Date  . Acute on chronic diastolic CHF (congestive heart failure) ([place]) 06/01/2017  . Arthritis   . Cancer [facility]) 2010   Prostate  . Chronic kidney disease   . Diabetes mellitus   . Diabetic neuropathy (HCC)    feet  . DVT (deep venous thrombosis) ([place])   . Genetic testing 06/22/2016   Mr. Disano underwent genetic counseling and testing for  hereditary cancer syndromes on 05/14/2016. His results were negative for mutations in all 46 genes analyzed by [facility]'s 46-gene Common Hereditary Cancers Panel. Genes analyzed include: APC, ATM, AXIN2, BARD1, BMPR1A, BRCA1, BRCA2, BRIP1, CDH1, CDKN2A, CHEK2, CTNNA1, DICER1, EPCAM, GREM1, HOXB13, KIT, MEN1, MLH1, MSH2, MSH3, MSH6, MUTYH, NB  . GERD (gastroesophageal reflux disease)   . Hypertension   . PE (pulmonary thromboembolism) ([employer])   . Pneumonia   . Sleep apnea    not wearing CPAP   Past Surgical History:  Procedure Laterality Date  . CARDIOVERSION N/A 06/03/2017   Procedure: CARDIOVERSION;  Surgeon: Jerline Pain, MD;  Location: [facility] ENDOSCOPY;  Service: Cardiovascular;  Laterality: N/A;  . COLONOSCOPY    . COLONOSCOPY WITH PROPOFOL N/A 10/20/2018   Procedure: COLONOSCOPY WITH PROPOFOL;  Surgeon: Thornton Park, MD;  Location: [facility] ENDOSCOPY;  Service: Gastroenterology;  Laterality: N/A;  . HERNIA REPAIR    . KNEE SURGERY    . MULTIPLE TOOTH EXTRACTIONS    . POLYPECTOMY  10/20/2018   Procedure: POLYPECTOMY;  Surgeon: Thornton Park, MD;  Location: [facility] ENDOSCOPY;  Service: Gastroenterology;;  . PROSTATE SURGERY    . RADIOACTIVE SEED IMPLANT    . RIGHT/LEFT HEART CATH AND CORONARY ANGIOGRAPHY N/A 07/06/2017   Procedure: RIGHT/LEFT HEART CATH AND CORONARY ANGIOGRAPHY;  Surgeon: Jettie Booze, MD;  Location: [place] CV LAB;  Service: Cardiovascular;  Laterality: N/A;  . SHOULDER SURGERY    . TOTAL KNEE ARTHROPLASTY Right 11/19/2016  Procedure: RIGHT TOTAL KNEE ARTHROPLASTY;  Surgeon: Leandrew Koyanagi, MD;  Location: [place];  Service: Orthopedics;  Laterality: Right;  . uretha surgery-2014     Social History:   reports that he has never smoked. He has never used smokeless tobacco. He reports that he does not drink alcohol and does not use drugs.  Family History  Problem Relation Age of Onset  . Breast cancer Mother 55       d.89  . Breast cancer Sister 53       d.30  .  Leukemia Brother 18       d.20  . Breast cancer Maternal Aunt 40       d.40s  . Lung cancer Maternal Uncle   . Prostate cancer Paternal Uncle   . Prostate cancer Brother        recurred recently at age 58  . Other Brother 15       spinal tumor  . Cervical cancer Other 22       d.22  . Cancer Sister 32       unspecified type  . Cancer Maternal Uncle 80       unspecified type    Medications: Patient's Medications  New Prescriptions   No medications on file  Previous Medications   ACCU-CHEK SOFTCLIX LANCETS LANCETS    Use to test blood sugar daily. Dx: E11.22   ALCOHOL SWABS (B-D SINGLE USE SWABS REGULAR) PADS    Use in testing blood sugar daily. Dx: E11.22   AMLODIPINE (NORVASC) 10 MG TABLET    Take 1 tablet (10 mg total) by mouth daily. for high blood pressure   ASPIRIN EC 81 MG TABLET    Take 81 mg by mouth daily.   ATORVASTATIN (LIPITOR) 20 MG TABLET    TAKE 1 TABLET ONCE DAILY FOR CHOLESTEROL   BLOOD GLUCOSE CALIBRATION (ACCU-CHEK AVIVA) SOLN    Use once daily as directed dx E11.22   CARVEDILOL (COREG) 12.5 MG TABLET    Take one tablet by mouth twice daily with a meal   ELDERBERRY PO    Take 1 tablet by mouth daily.   FUROSEMIDE (LASIX) 40 MG TABLET    Take 1 tablet (40 mg total) by mouth daily.   GLIPIZIDE (GLUCOTROL) 5 MG TABLET    Take 1 tablet (5 mg total) by mouth daily before breakfast.   GLUCOSE BLOOD (ACCU-CHEK AVIVA PLUS) TEST STRIP    1 each by Other route as needed for other. Use to test blood sugar daily. Dx: E11.22   HYDRALAZINE (APRESOLINE) 50 MG TABLET    Take 1 tablet (50 mg total) by mouth 3 (three) times daily.   JARDIANCE 10 MG TABS TABLET    TAKE 1 TABLET BY MOUTH ONCE A DAY   LOSARTAN (COZAAR) 100 MG TABLET    Take 1 tablet (100 mg total) by mouth daily.   METFORMIN (GLUCOPHAGE) 500 MG TABLET    TAKE 1 TABLET (500 MG TOTAL) BY MOUTH 2 TIMES DAILY WITH A MEAL.   MULTIPLE VITAMINS-MINERALS (EMERGEN-C VITAMIN C PO)    Take 1 tablet by mouth daily.    PANTOPRAZOLE (PROTONIX) 40 MG TABLET    Take 1 tablet (40 mg total) by mouth daily.   POTASSIUM CHLORIDE SA (KLOR-CON) 20 MEQ TABLET    Take 1 tablet (20 mEq total) by mouth 2 (two) times daily.   RIVAROXABAN (XARELTO) 20 MG TABS TABLET    Take 1 tablet (20 mg total) by mouth daily  with supper.   SENNA 8.7 MG CHEW    Chew by mouth as needed.   TRAMADOL (ULTRAM) 50 MG TABLET    TAKE 1 TABLET BY MOUTH EVERY 6 HOURS AS NEEDED FOR PAIN   VITAMIN B-12 (CYANOCOBALAMIN) 500 MCG TABLET    Take 500 mcg by mouth daily.  Modified Medications   No medications on file  Discontinued Medications   No medications on file    Physical Exam:  Vitals:   09/06/19 0838  BP: (!) 144/90  Pulse: 65  Temp: (!) 96 F (35.6 C)  TempSrc: Temporal  SpO2: 97%  Weight: (!) 320 lb 3.2 oz (145.2 kg)  Height: '5\' 9"'$  (1.753 m)   Body mass index is 47.29 kg/m. Wt Readings from Last 3 Encounters:  09/06/19 (!) 320 lb 3.2 oz (145.2 kg)  07/28/19 (!) 325 lb (147.4 kg)  05/11/19 (!) 318 lb (144.2 kg)    Physical Exam Constitutional:      Appearance: Normal appearance.  HENT:     Head: Normocephalic and atraumatic.  Musculoskeletal:       Arms:     Right lower leg: No edema.     Left lower leg: No edema.     Comments: Tenderness noted above iliac crest, non-tender on bone. Tenderness with movement and palpitation. No rash, redness or swelling.  Skin:    General: Skin is warm and dry.  Neurological:     Mental Status: He is alert and oriented to person, place, and time.     Gait: Gait abnormal.     Labs reviewed: Basic Metabolic Panel: Recent Labs    05/11/19 1024  NA 144  K 3.4*  CL 105  CO2 26  GLUCOSE 209*  BUN 9  CREATININE 1.04  CALCIUM 9.0   Liver Function Tests: Recent Labs    05/11/19 1024  AST 13  ALT 16  BILITOT 0.8  PROT 7.4   No results for input(s): LIPASE, AMYLASE in the last 8760 hours. No results for input(s): AMMONIA in the last 8760 hours. CBC: Recent Labs     05/11/19 1024  WBC 7.3  NEUTROABS 5,672  HGB 14.0  HCT 43.1  MCV 83.5  PLT 332   Lipid Panel: No results for input(s): CHOL, HDL, LDLCALC, TRIG, CHOLHDL, LDLDIRECT in the last 8760 hours. TSH: No results for input(s): TSH in the last 8760 hours. A1C: Lab Results  Component Value Date   HGBA1C 7.2 (H) 05/11/2019     Assessment/Plan 1. Type 2 diabetes mellitus with chronic kidney disease, without long-term current use of insulin, unspecified CKD stage ([place]) -continue dietary modifications with medication, will follow up labs today Encouraged dietary compliance, routine foot care/monitoring and to keep up with diabetic eye exams through ophthalmology  - empagliflozin (JARDIANCE) 10 MG TABS tablet; Take 1 tablet (10 mg total) by mouth daily.  Dispense: 30 tablet; Refill: 5 - Hemoglobin J8A - BASIC METABOLIC PANEL WITH GFR  2. Essential hypertension -not at goal. To increase hydralazine to 75 mg TID, to continue norvasc, losartan and lasix.  - hydrALAZINE (APRESOLINE) 50 MG tablet; Take 1.5 tablets (75 mg total) by mouth 3 (three) times daily.  Dispense: 180 tablet; Refill: 1  3. Hyperlipidemia with target LDL less than 70 -continues on Lipitor, encouraged dietary modifications.  Fasting today, will follow up - Lipid Panel  4. Muscle strain -he had not been exercising and started lifting weights as he had done in the past, suspect muscle strain, unable to take  NSAID. Encouraged heat, rest (no lifting), to use tramadol (which he already has prescribed) with tylenol as needed and flexeril PRN. To notify if pain worsens or fails to improve.  - cyclobenzaprine (FLEXERIL) 5 MG tablet; Take 1 tablet (5 mg total) by mouth 3 (three) times daily as needed for muscle spasms.  Dispense: 30 tablet; Refill: 1    Next appt: 2 weeks for bp check Favio Moder K. [place], [facility] [phone]

## 2019-09-06 NOTE — Patient Instructions (Signed)
To use flexeril every 8 hours as needed for muscle spasm or pain To use heating pad to area at least twice daily ~20 mins  but 3 times daily if able Can use muscle rub after heating pad as well  To stretch area but not to lift weights To continue to use tramadol every 6 hours as needed and tylenol 500 mg 2 tablets every 8 hours as needed   Consider physical therapy   If pain worsens or fails to improve to notify   To increase hydralazine to 75 mg three times daily Continue to work on low sodium diet  To reschedule Friday appt and come in 2 weeks for blood pressure follow up

## 2019-09-07 LAB — LIPID PANEL
Cholesterol: 172 mg/dL (ref <200)
HDL: 49 mg/dL (ref >=40)
LDL Cholesterol (Calc): 105 mg/dL (calc) — ABNORMAL HIGH
Non-HDL Cholesterol (Calc): 123 mg/dL (calc) (ref <130)
Total CHOL/HDL Ratio: 3.5 (calc) (ref <5.0)
Triglycerides: 86 mg/dL (ref <150)

## 2019-09-07 LAB — BASIC METABOLIC PANEL WITH GFR
BUN: 18 mg/dL (ref 7–25)
CO2: 26 mmol/L (ref 20–32)
Calcium: 9.2 mg/dL (ref 8.6–10.3)
Chloride: 105 mmol/L (ref 98–110)
Creat: 1.15 mg/dL (ref 0.70–1.25)
GFR, Est African American: 75 mL/min/{1.73_m2} (ref >=60)
GFR, Est Non African American: 65 mL/min/{1.73_m2} (ref >=60)
Glucose, Bld: 204 mg/dL — ABNORMAL HIGH (ref 65–99)
Potassium: 3.3 mmol/L — ABNORMAL LOW (ref 3.5–5.3)
Sodium: 141 mmol/L (ref 135–146)

## 2019-09-07 LAB — HEMOGLOBIN A1C
Hgb A1c MFr Bld: 6.4 % of total Hgb — ABNORMAL HIGH (ref <5.7)
Mean Plasma Glucose: 137 (calc)
eAG (mmol/L): 7.6 (calc)

## 2019-09-08 ENCOUNTER — Ambulatory Visit: Payer: Medicare HMO | Admitting: Nurse Practitioner

## 2019-09-15 ENCOUNTER — Ambulatory Visit (INDEPENDENT_AMBULATORY_CARE_PROVIDER_SITE_OTHER): Payer: Medicare HMO | Admitting: Adult Health

## 2019-09-15 ENCOUNTER — Other Ambulatory Visit: Payer: Self-pay

## 2019-09-15 ENCOUNTER — Telehealth: Payer: Self-pay

## 2019-09-15 ENCOUNTER — Encounter: Payer: Self-pay | Admitting: Adult Health

## 2019-09-15 VITALS — BP 138/76 | HR 67 | Temp 97.5°F | Ht 69.0 in | Wt 328.6 lb

## 2019-09-15 DIAGNOSIS — R399 Unspecified symptoms and signs involving the genitourinary system: Secondary | ICD-10-CM | POA: Diagnosis not present

## 2019-09-15 DIAGNOSIS — M170 Bilateral primary osteoarthritis of knee: Secondary | ICD-10-CM | POA: Diagnosis not present

## 2019-09-15 LAB — POCT URINALYSIS DIPSTICK
Bilirubin, UA: NEGATIVE
Blood, UA: NEGATIVE
Glucose, UA: NEGATIVE
Ketones, UA: 5
Nitrite, UA: NEGATIVE
Protein, UA: POSITIVE — AB
Spec Grav, UA: 1.015 (ref 1.010–1.025)
Urobilinogen, UA: 0.2 E.U./dL
pH, UA: 5 (ref 5.0–8.0)

## 2019-09-15 MED ORDER — TRAMADOL HCL 50 MG PO TABS: 50.0000 mg | ORAL_TABLET | Freq: Four times a day (QID) | ORAL | 0 refills | Status: DC | PRN

## 2019-09-15 MED ORDER — NITROFURANTOIN MONOHYD MACRO 100 MG PO CAPS: 100.0000 mg | ORAL_CAPSULE | Freq: Two times a day (BID) | ORAL | 0 refills | Status: DC

## 2019-09-15 MED FILL — traMADol HCL 50 MG TABS: 50 | 8 days supply | Qty: 30 | Fill #0

## 2019-09-15 MED FILL — NITROFURANTOIN MONO-MCR 100: 100 | 7 days supply | Qty: 14 | Fill #0

## 2019-09-15 NOTE — Telephone Encounter (Signed)
Called patient and tried to schedule an in office appointment today. He said he couldn't come into the office this afternoon, just wants to drop off a urine sample. Advised him that he needed an in office appointment and we could offer him one this afternoon. Janett Billow stated he is also followed by Urology and could contact them. He was very irate and he was going to call someone higher up and complain.  Patient did not specify any symptoms, but when his wife called earlier she said he was in pain.

## 2019-09-15 NOTE — Telephone Encounter (Addendum)
Patient called to say he has been trying to get in touch with someone since Friday of last week he said he had left 2 messages to be called back but no one returned his call he feels as  if he might have a UTI says he has some burning while he is using catheter to relieve himself and his urine is a light yellow color he said he was told today that he could just bring in his urine and we would test it by someone on our staff  Then he said they told him he would have  to make an appointment and be seen for this problem to dermine the nature of what he is experiencing he called back to speak with Sherrie Mustache NP as she is his PCP I told him I would send a message to her for him of what was going on but he would most likely need to make an appointment to be seen he agreed to come in at 1:30 to be seen by Seth Bake NP and apologize for his tone earlier because he thought it was later than it was because he had another appointment today at 3:00 I told him it was okay about his  misunderstanding earlier and ended the call

## 2019-09-15 NOTE — Progress Notes (Signed)
This encounter was created in error - please disregard.

## 2019-09-15 NOTE — Telephone Encounter (Signed)
Patient's wife called today and stated they have called multiple times and have left VM and have had no response. Patient is in "pain" and they want to leave a urine specimen and get an Rx for an antibiotic. I explained we cannot give an Rx until we get culture results back and that can take up to 3 days.  Patient just wants to drop off a specimen, even though I explained that he needed an in office visit, per [facility] protocol. He stated he had things to do with his wife today and he could not come in for an appointment.  Chrae stated it was up to provider discretion if they could just drop off a sample.  Please advise.

## 2019-09-15 NOTE — Telephone Encounter (Signed)
Patient consented to come into the office for an appointment today.

## 2019-09-15 NOTE — Patient Instructions (Signed)
Acute Urinary Retention, Male ° °Acute urinary retention means that you cannot pee (urinate) at all, or that you pee too little and your bladder is not emptied completely. If it is not treated, it can lead to kidney damage or other serious problems. °Follow these instructions at home: °· Take over-the-counter and prescription medicines only as told by your doctor. Ask your doctor what medicines you should stay away from. Do not take any medicine unless your doctor says it is okay to do so. °· If you were sent home with a tube that drains the bladder (catheter), take care of it as told by your doctor. °· Drink enough fluid to keep your pee clear or pale yellow. °· If you were given an antibiotic, take it as told by your doctor. Do not stop taking the antibiotic even if you start to feel better. °· Do not use any products that contain nicotine or tobacco, such as cigarettes and e-cigarettes. If you need help quitting, ask your doctor. °· Watch for changes in your symptoms. Tell your doctor about them. °· If told, track changes in your blood pressure at home. Tell your doctor about them. °· Keep all follow-up visits as told by your doctor. This is important. °Contact a doctor if: °· You have spasms or you leak pee when you have spasms. °Get help right away if: °· You have chills or a fever. °· You have a tube that drains the bladder and: °? The tube stops draining pee. °? The tube falls out. °· You have blood in your pee. °Summary °· Acute urinary retention means that you have problems peeing. It may mean that you cannot pee at all, or that you pee too little. °· If this condition is not treated, it can lead to kidney damage or other serious problems. °· If you were sent home with a tube that drains the bladder, take care of it as told by your doctor. °· Monitor any changes in your symptoms. Tell your doctor about any changes. °This information is not intended to replace advice given to you by your health care  provider. Make sure you discuss any questions you have with your health care provider. °Document Revised: 04/21/2018 Document Reviewed: 03/06/2016 °Elsevier Patient Education © 2020 [employer] Inc. ° °

## 2019-09-15 NOTE — Progress Notes (Signed)
[facility]  Provider:  Durenda Age, DNP, MSN, FNP-BC  Code Status: FULL CODE  Goals of Care:  Advanced Directives 10/20/2018  Does Patient Have a Medical Advance Directive? No  Type of Advance Directive -  Does patient want to make changes to medical advance directive? -  Copy of [place] in Chart? -  Would patient like information on creating a medical advance directive? -     Chief Complaint  Patient presents with  . Acute Visit    Patient with burning on urination, self-caths.     HPI: Patient is a 69 y.o. male seen today for an acute visit for dysuria. He has PMH of prostate cancer, CKD, diabetes mellitus with neuropathy, PE, PAF and chronic diastolic CHF. He does self catheterizations several times a day. There are times that it overflows and he urinates without catheterization and that is when he has pain. He denies having fever nor flank pain. He has osteoarthritis of both knees. There is no pain on his knees at present. It is when he gets up to walk that's when he feels the pain. He is requesting refill on his PRN Tramadol.   Past Medical History:  Diagnosis Date  . Acute on chronic diastolic CHF (congestive heart failure) ([place]) 06/01/2017  . Arthritis   . Cancer [facility]) 2010   Prostate  . Chronic kidney disease   . Diabetes mellitus   . Diabetic neuropathy (HCC)    feet  . DVT (deep venous thrombosis) ([place])   . Genetic testing 06/22/2016   Mr. Stracke underwent genetic counseling and testing for hereditary cancer syndromes on 05/14/2016. His results were negative for mutations in all 46 genes analyzed by [facility]'s 46-gene Common Hereditary Cancers Panel. Genes analyzed include: APC, ATM, AXIN2, BARD1, BMPR1A, BRCA1, BRCA2, BRIP1, CDH1, CDKN2A, CHEK2, CTNNA1, DICER1, EPCAM, GREM1, HOXB13, KIT, MEN1, MLH1, MSH2, MSH3, MSH6, MUTYH, NB  . GERD (gastroesophageal reflux disease)   . Hypertension   . PE (pulmonary thromboembolism) ([place])   .  Pneumonia   . Sleep apnea    not wearing CPAP    Past Surgical History:  Procedure Laterality Date  . CARDIOVERSION N/A 06/03/2017   Procedure: CARDIOVERSION;  Surgeon: Jerline Pain, MD;  Location: [facility] ENDOSCOPY;  Service: Cardiovascular;  Laterality: N/A;  . COLONOSCOPY    . COLONOSCOPY WITH PROPOFOL N/A 10/20/2018   Procedure: COLONOSCOPY WITH PROPOFOL;  Surgeon: Thornton Park, MD;  Location: [facility] ENDOSCOPY;  Service: Gastroenterology;  Laterality: N/A;  . HERNIA REPAIR    . KNEE SURGERY    . MULTIPLE TOOTH EXTRACTIONS    . POLYPECTOMY  10/20/2018   Procedure: POLYPECTOMY;  Surgeon: Thornton Park, MD;  Location: [facility] ENDOSCOPY;  Service: Gastroenterology;;  . PROSTATE SURGERY    . RADIOACTIVE SEED IMPLANT    . RIGHT/LEFT HEART CATH AND CORONARY ANGIOGRAPHY N/A 07/06/2017   Procedure: RIGHT/LEFT HEART CATH AND CORONARY ANGIOGRAPHY;  Surgeon: Jettie Booze, MD;  Location: [place] CV LAB;  Service: Cardiovascular;  Laterality: N/A;  . SHOULDER SURGERY    . TOTAL KNEE ARTHROPLASTY Right 11/19/2016   Procedure: RIGHT TOTAL KNEE ARTHROPLASTY;  Surgeon: Leandrew Koyanagi, MD;  Location: [place];  Service: Orthopedics;  Laterality: Right;  . uretha surgery-2014      Allergies  Allergen Reactions  . Lisinopril Cough    Outpatient Encounter Medications as of 09/15/2019  Medication Sig  . Accu-Chek Softclix Lancets lancets Use to test blood sugar daily. Dx: E11.22  . Alcohol  Swabs (B-D SINGLE USE SWABS REGULAR) PADS Use in testing blood sugar daily. Dx: E11.22  . amLODipine (NORVASC) 10 MG tablet Take 1 tablet (10 mg total) by mouth daily. for high blood pressure  . aspirin EC 81 MG tablet Take 81 mg by mouth daily.  Marland Kitchen atorvastatin (LIPITOR) 20 MG tablet TAKE 1 TABLET ONCE DAILY FOR CHOLESTEROL  . Blood Glucose Calibration (ACCU-CHEK AVIVA) SOLN Use once daily as directed dx E11.22  . carvedilol (COREG) 12.5 MG tablet Take one tablet by mouth twice daily with a meal  .  cyclobenzaprine (FLEXERIL) 5 MG tablet Take 1 tablet (5 mg total) by mouth 3 (three) times daily as needed for muscle spasms.  Marland Kitchen ELDERBERRY PO Take 1 tablet by mouth daily.  . empagliflozin (JARDIANCE) 10 MG TABS tablet Take 1 tablet (10 mg total) by mouth daily.  . furosemide (LASIX) 40 MG tablet Take 1 tablet (40 mg total) by mouth daily.  Marland Kitchen glipiZIDE (GLUCOTROL) 5 MG tablet Take 1 tablet (5 mg total) by mouth daily before breakfast.  . glucose blood (ACCU-CHEK AVIVA PLUS) test strip 1 each by Other route as needed for other. Use to test blood sugar daily. Dx: E11.22  . hydrALAZINE (APRESOLINE) 50 MG tablet Take 1.5 tablets (75 mg total) by mouth 3 (three) times daily.  Marland Kitchen losartan (COZAAR) 100 MG tablet Take 1 tablet (100 mg total) by mouth daily.  . metFORMIN (GLUCOPHAGE) 500 MG tablet TAKE 1 TABLET (500 MG TOTAL) BY MOUTH 2 TIMES DAILY WITH A MEAL.  . Multiple Vitamins-Minerals (EMERGEN-C VITAMIN C PO) Take 1 tablet by mouth daily.  . pantoprazole (PROTONIX) 40 MG tablet Take 1 tablet (40 mg total) by mouth daily.  . potassium chloride SA (KLOR-CON) 20 MEQ tablet Take 1 tablet (20 mEq total) by mouth 2 (two) times daily.  . rivaroxaban (XARELTO) 20 MG TABS tablet Take 1 tablet (20 mg total) by mouth daily with supper.  . Senna 8.7 MG CHEW Chew by mouth as needed.  . traMADol (ULTRAM) 50 MG tablet Take 1 tablet (50 mg total) by mouth every 6 (six) hours as needed. for pain  . [DISCONTINUED] traMADol (ULTRAM) 50 MG tablet TAKE 1 TABLET BY MOUTH EVERY 6 HOURS AS NEEDED FOR PAIN  . nitrofurantoin, macrocrystal-monohydrate, (MACROBID) 100 MG capsule Take 1 capsule (100 mg total) by mouth 2 (two) times daily for 7 days.  . [DISCONTINUED] vitamin B-12 (CYANOCOBALAMIN) 500 MCG tablet Take 500 mcg by mouth daily.   No facility-administered encounter medications on file as of 09/15/2019.    Review of Systems:  Review of Systems  Constitutional: Negative for activity change, appetite change and  chills.  HENT: Negative for congestion and ear pain.   Eyes: Negative for discharge and itching.  Respiratory: Negative for cough and chest tightness.   Cardiovascular: Positive for leg swelling.       Left leg edema  Gastrointestinal: Negative for abdominal pain, nausea and vomiting.  Endocrine: Positive for polydipsia and polyuria. Negative for polyphagia.  Genitourinary: Negative for discharge, dysuria and flank pain.       Does self catheterization  "Burns when urine is overflowing"  Neurological: Negative for dizziness.  Psychiatric/Behavioral: Negative for agitation.    Health Maintenance  Topic Date Due  . COVID-19 Vaccine (1) Never done  . TETANUS/TDAP  07/31/2016  . FOOT EXAM  12/10/2018  . INFLUENZA VACCINE  09/17/2019  . OPHTHALMOLOGY EXAM  02/28/2020  . HEMOGLOBIN A1C  03/08/2020  . COLONOSCOPY  10/19/2028  .  Hepatitis C Screening  Completed  . PNA vac Low Risk Adult  Completed    Physical Exam: Vitals:   09/15/19 1400  BP: (!) 138/76  Pulse: 67  Temp: (!) 97.5 F (36.4 C)  TempSrc: Temporal  SpO2: 95%  Weight: (!) 328 lb 9.6 oz (149.1 kg)  Height: _0  (1.753 m)   Body mass index is 48.53 kg/m. Physical Exam Constitutional:      Appearance: He is obese.  HENT:     Head: Normocephalic and atraumatic.     Nose: Nose normal.  Cardiovascular:     Rate and Rhythm: Normal rate and regular rhythm.     Pulses: Normal pulses.     Heart sounds: Normal heart sounds.  Pulmonary:     Effort: Pulmonary effort is normal.     Breath sounds: Normal breath sounds.  Abdominal:     General: Bowel sounds are normal.     Palpations: Abdomen is soft.  Musculoskeletal:     Cervical back: Normal range of motion.  Neurological:     Mental Status: He is alert.     Labs reviewed: Basic Metabolic Panel: Recent Labs    05/11/19 1024 09/06/19 0913  NA 144 141  K 3.4* 3.3*  CL 105 105  CO2 26 26  GLUCOSE 209* 204*  BUN 9 18  CREATININE 1.04 1.15  CALCIUM  9.0 9.2   Liver Function Tests: Recent Labs    05/11/19 1024  AST 13  ALT 16  BILITOT 0.8  PROT 7.4    Recent Labs    05/11/19 1024  WBC 7.3  NEUTROABS 5,672  HGB 14.0  HCT 43.1  MCV 83.5  PLT 332   Lipid Panel: Recent Labs    09/06/19 0913  CHOL 172  HDL 49  LDLCALC 105*  TRIG 86  CHOLHDL 3.5   Lab Results  Component Value Date   HGBA1C 6.4 (H) 09/06/2019     Assessment/Plan  1. Symptoms of urinary tract infection - POC Urinalysis Dipstick showed moderate leukocytes, positive protein and cloudy -  Will start Nitrofurantoin - nitrofurantoin, macrocrystal-monohydrate, (MACROBID) 100 MG capsule; Take 1 capsule (100 mg total) by mouth 2 (two) times daily for 7 days.  Dispense: 14 capsule; Refill: 0 - Culture, Urine -Discussed observance of sterile technique when doing self-catheterization  2. Primary osteoarthritis of both knees -Continue PRN tramadol - traMADol (ULTRAM) 50 MG tablet; Take 1 tablet (50 mg total) by mouth every 6 (six) hours as needed. for pain  Dispense: 30 tablet; Refill: 0     Labs/tests ordered: Urinalysis dipstick, urine culture   Next appt:  09/29/2019   Coltan Spinello Medina-Vargas - DNP, MSN, FNP-BC [facility] [phone] (Monday-Friday 8:00 a.m. - 5:00 p.m.) [phone] (after hours)

## 2019-09-15 NOTE — Telephone Encounter (Signed)
Where is his pain? I know he is followed by urologist as well.

## 2019-09-17 ENCOUNTER — Other Ambulatory Visit: Payer: Self-pay | Admitting: Adult Health

## 2019-09-17 LAB — URINE CULTURE
MICRO NUMBER:: 10771894
SPECIMEN QUALITY:: ADEQUATE

## 2019-09-17 MED ORDER — SULFAMETHOXAZOLE-TRIMETHOPRIM 800-160 MG PO TABS: 1.0000 | ORAL_TABLET | Freq: Two times a day (BID) | ORAL | 0 refills | Status: AC

## 2019-09-18 MED FILL — SULFAMETHOXAZOLE-TMP DS TAB: 800-160 | 5 days supply | Qty: 10 | Fill #0

## 2019-09-19 ENCOUNTER — Other Ambulatory Visit: Payer: Self-pay

## 2019-09-19 MED ORDER — ATORVASTATIN CALCIUM 40 MG PO TABS
40.0000 mg | ORAL_TABLET | Freq: Every day | ORAL | 1 refills | Status: DC
Start: 2019-09-19 — End: 2021-04-02

## 2019-09-26 ENCOUNTER — Encounter: Payer: Self-pay | Admitting: Nurse Practitioner

## 2019-09-26 ENCOUNTER — Telehealth: Payer: Self-pay

## 2019-09-26 NOTE — Telephone Encounter (Signed)
Yes I do not see where he has been placed back on oxygen, also we would have to have the letter for jury duty as well. I am not sure if being on oxygen is an excuse for dismissal from jury duty

## 2019-09-26 NOTE — Telephone Encounter (Addendum)
The patient states he just received a jury duty letter for 10/03/19 as he has been staying somewhere else. He states he is a "COVID long hauler" and there is no way he can serve. He is asking for a letter that would exempt him from jury duty. He states he is on oxygen, but when he was seen in the office recently he was not on oxygen. Wife stated that he was seen at [facility] and tested positive for COVID antibodies, "probably the first of the year."

## 2019-09-26 NOTE — Telephone Encounter (Signed)
Called patient and told him we would need a copy and that you weren't sure that being on O2 was enough to keep him from jury duty. He stated that he would be so short of breath and that you knew he had had a recent knee surgery and that he would need to be in a wheelchair.

## 2019-09-29 ENCOUNTER — Ambulatory Visit: Payer: Medicare HMO | Admitting: Nurse Practitioner

## 2019-09-30 ENCOUNTER — Other Ambulatory Visit (HOSPITAL_COMMUNITY): Payer: Self-pay | Admitting: Family Medicine

## 2019-10-02 MED FILL — AMOX-CLAV 875-125 MG TABLET: 875-125 | 7 days supply | Qty: 14 | Fill #0

## 2019-10-02 MED FILL — CARVEDILOL 6.25 MG TABLET: 6.25 | 30 days supply | Qty: 60 | Fill #0

## 2019-10-02 MED FILL — METHOCARBAMOL 750 MG TABS: 750 | 10 days supply | Qty: 20 | Fill #0

## 2019-10-19 ENCOUNTER — Other Ambulatory Visit: Payer: Self-pay | Admitting: Adult Health

## 2019-10-19 DIAGNOSIS — M170 Bilateral primary osteoarthritis of knee: Secondary | ICD-10-CM

## 2019-10-19 NOTE — Telephone Encounter (Signed)
Last refill 09/15/19  Non-opioid contract completed 01/2019  Last OV 09/15/19  Has cancelled a few appointments. Does have a lab appointment scheduled this month.

## 2019-10-20 MED FILL — traMADol HCL 50 MG TABS: 50 | 8 days supply | Qty: 30 | Fill #0

## 2019-11-03 ENCOUNTER — Other Ambulatory Visit: Payer: Medicare HMO

## 2019-11-06 ENCOUNTER — Other Ambulatory Visit: Payer: Medicare HMO

## 2019-11-06 ENCOUNTER — Other Ambulatory Visit: Payer: Self-pay

## 2019-11-06 DIAGNOSIS — E785 Hyperlipidemia, unspecified: Secondary | ICD-10-CM

## 2019-11-06 DIAGNOSIS — E1122 Type 2 diabetes mellitus with diabetic chronic kidney disease: Secondary | ICD-10-CM

## 2019-11-06 DIAGNOSIS — I10 Essential (primary) hypertension: Secondary | ICD-10-CM

## 2019-11-07 LAB — COMPREHENSIVE METABOLIC PANEL
AG Ratio: 0.9 (calc) — ABNORMAL LOW (ref 1.0–2.5)
ALT: 10 U/L (ref 9–46)
AST: 11 U/L (ref 10–35)
Albumin: 3.6 g/dL (ref 3.6–5.1)
Alkaline phosphatase (APISO): 78 U/L (ref 35–144)
BUN: 12 mg/dL (ref 7–25)
CO2: 28 mmol/L (ref 20–32)
Calcium: 8.6 mg/dL (ref 8.6–10.3)
Chloride: 101 mmol/L (ref 98–110)
Creat: 0.95 mg/dL (ref 0.70–1.25)
Globulin: 3.8 g/dL (calc) — ABNORMAL HIGH (ref 1.9–3.7)
Glucose, Bld: 164 mg/dL — ABNORMAL HIGH (ref 65–99)
Potassium: 3.5 mmol/L (ref 3.5–5.3)
Sodium: 143 mmol/L (ref 135–146)
Total Bilirubin: 1 mg/dL (ref 0.2–1.2)
Total Protein: 7.4 g/dL (ref 6.1–8.1)

## 2019-11-07 LAB — LIPID PANEL
Cholesterol: 118 mg/dL (ref <200)
HDL: 37 mg/dL — ABNORMAL LOW (ref >=40)
LDL Cholesterol (Calc): 65 mg/dL (calc)
Non-HDL Cholesterol (Calc): 81 mg/dL (calc) (ref <130)
Total CHOL/HDL Ratio: 3.2 (calc) (ref <5.0)
Triglycerides: 80 mg/dL (ref <150)

## 2019-11-07 LAB — CBC
HCT: 39.3 % (ref 38.5–50.0)
Hemoglobin: 12.6 g/dL — ABNORMAL LOW (ref 13.2–17.1)
MCH: 26.7 pg — ABNORMAL LOW (ref 27.0–33.0)
MCHC: 32.1 g/dL (ref 32.0–36.0)
MCV: 83.3 fL (ref 80.0–100.0)
MPV: 10.3 fL (ref 7.5–12.5)
Platelets: 393 10*3/uL (ref 140–400)
RBC: 4.72 10*6/uL (ref 4.20–5.80)
RDW: 13.5 % (ref 11.0–15.0)
WBC: 9 10*3/uL (ref 3.8–10.8)

## 2019-11-07 LAB — HEMOGLOBIN A1C
Hgb A1c MFr Bld: 6.9 % of total Hgb — ABNORMAL HIGH (ref <5.7)
Mean Plasma Glucose: 151 (calc)
eAG (mmol/L): 8.4 (calc)

## 2019-11-07 LAB — TSH: TSH: 0.95 mIU/L (ref 0.40–4.50)

## 2019-11-09 ENCOUNTER — Ambulatory Visit: Payer: Medicare HMO | Admitting: Family

## 2019-11-10 ENCOUNTER — Ambulatory Visit: Payer: Medicare HMO | Admitting: Family

## 2019-11-17 ENCOUNTER — Other Ambulatory Visit: Payer: Self-pay | Admitting: Family

## 2019-11-17 DIAGNOSIS — M170 Bilateral primary osteoarthritis of knee: Secondary | ICD-10-CM

## 2019-11-17 DIAGNOSIS — Z9359 Other cystostomy status: Secondary | ICD-10-CM

## 2019-11-17 HISTORY — DX: Other cystostomy status: Z93.59

## 2019-11-17 MED FILL — JARDIANCE 10 MG TABLET: 10 | 30 days supply | Qty: 30 | Fill #1

## 2019-11-17 MED FILL — traMADol HCL 50 MG TABS: 50 | 7 days supply | Qty: 30 | Fill #0

## 2019-11-17 NOTE — Telephone Encounter (Signed)
Received refill Request from pharmacy Epic LR: 10/19/2019 Contract on file Pended Rx and sent to [place] for approval.

## 2019-12-14 ENCOUNTER — Other Ambulatory Visit: Payer: Self-pay | Admitting: Nurse Practitioner

## 2019-12-14 DIAGNOSIS — I10 Essential (primary) hypertension: Secondary | ICD-10-CM

## 2019-12-14 DIAGNOSIS — M170 Bilateral primary osteoarthritis of knee: Secondary | ICD-10-CM

## 2019-12-14 NOTE — Telephone Encounter (Signed)
Hydralazine Math  1.5 tablets 3 x a day = 4.5 tablets daily  4.5 tablets daily x 30 days = 135 tablets per month  135 tablets per month x 3 month supply = 405 tablets  Tramadol was last filled on 11/17/2019, non-opioid treatment agreement on file from 01/27/2019

## 2019-12-15 ENCOUNTER — Other Ambulatory Visit: Payer: Self-pay | Admitting: Nurse Practitioner

## 2019-12-15 MED FILL — hydrALAZINE HCL 25 MG TABS: 25 | 90 days supply | Qty: 270 | Fill #0

## 2019-12-15 MED FILL — traMADol HCL 50 MG TABS: 50 | 7 days supply | Qty: 30 | Fill #0

## 2019-12-18 IMAGING — DX DG CHEST 2V
2 series · 2 of 2 positions shown · non-contrast
Comparison: 04/02/2017

CLINICAL DATA: Chest pain with shortness of breath

EXAM:
CHEST - 2 VIEW

[chest lat (1 of 2)]
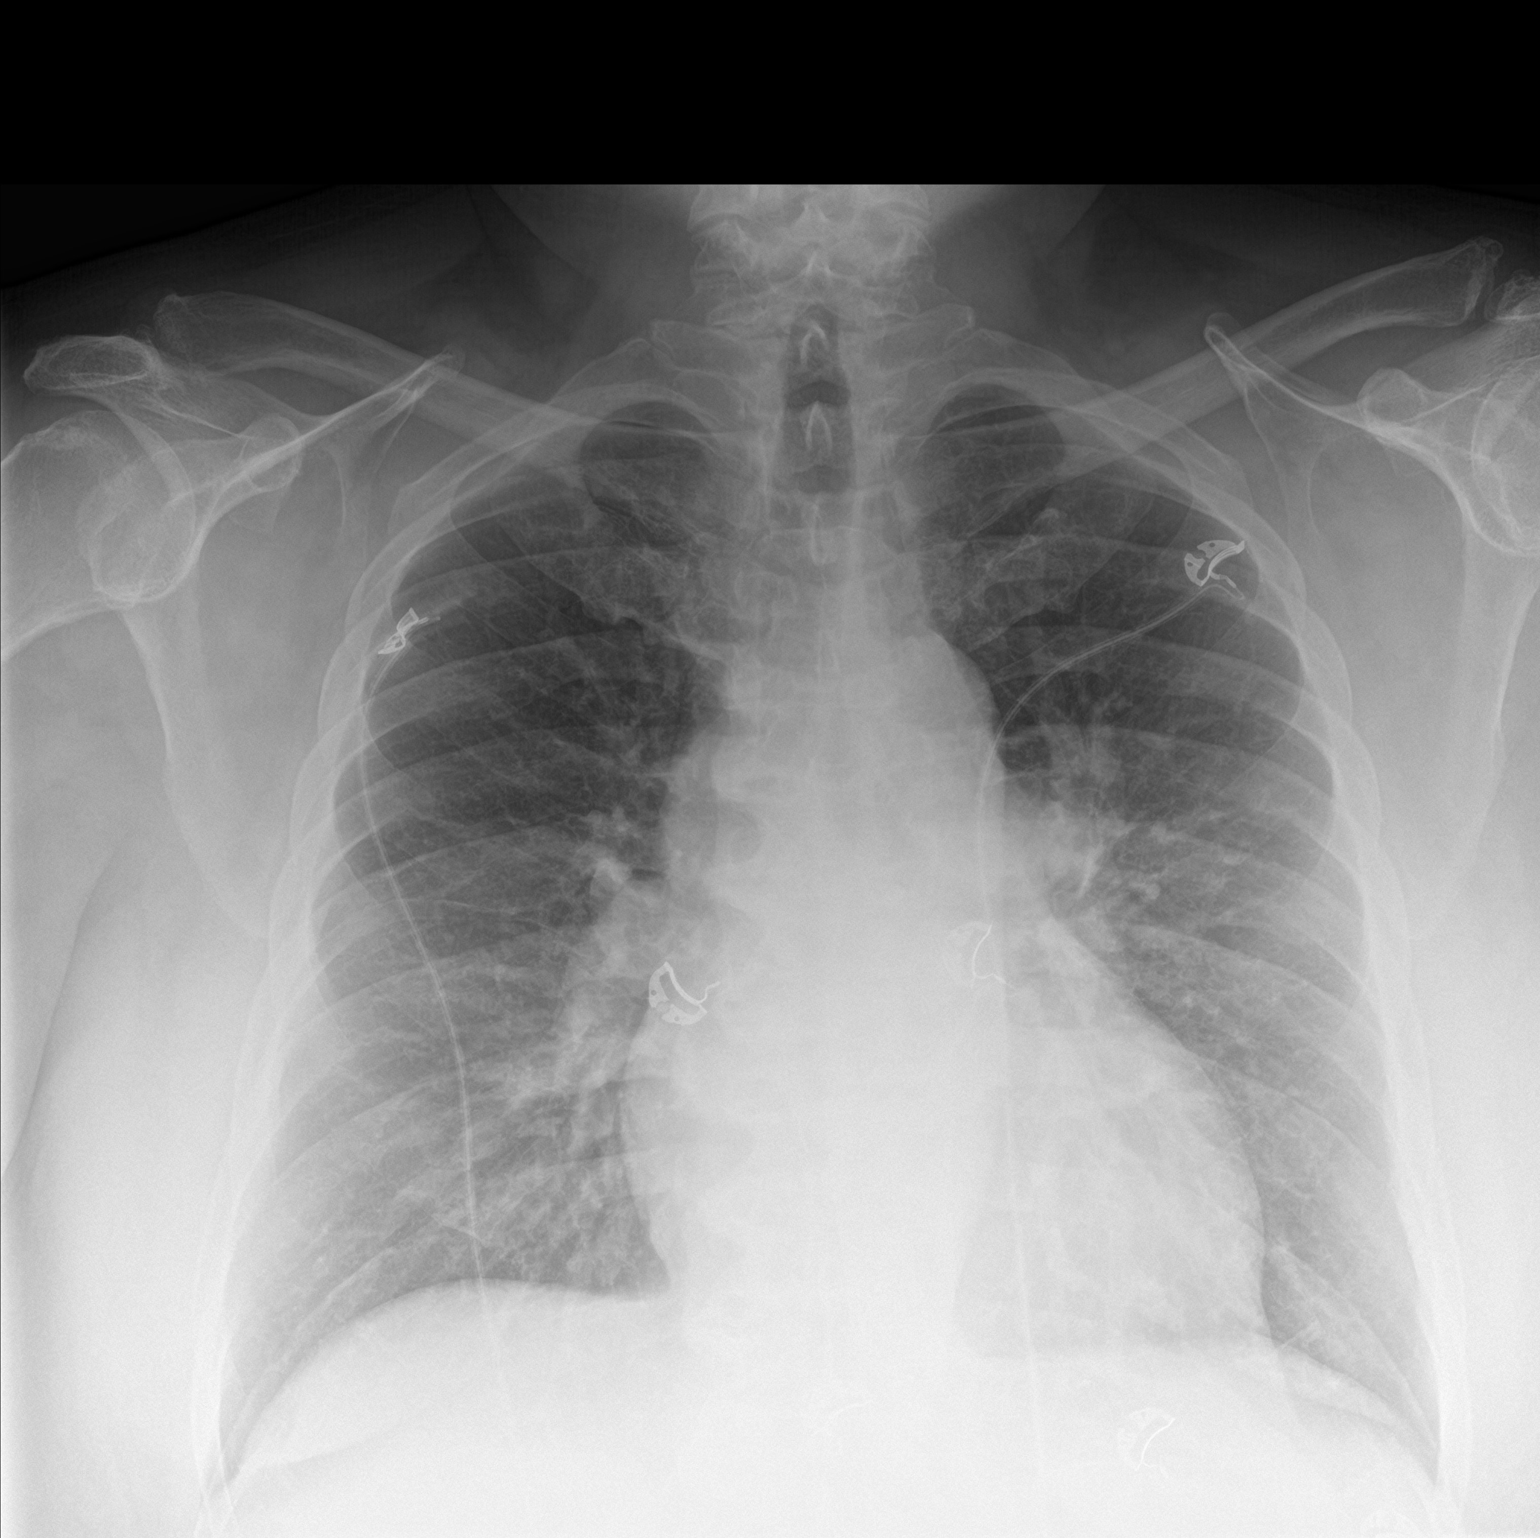

[chest lat (2 of 2)]
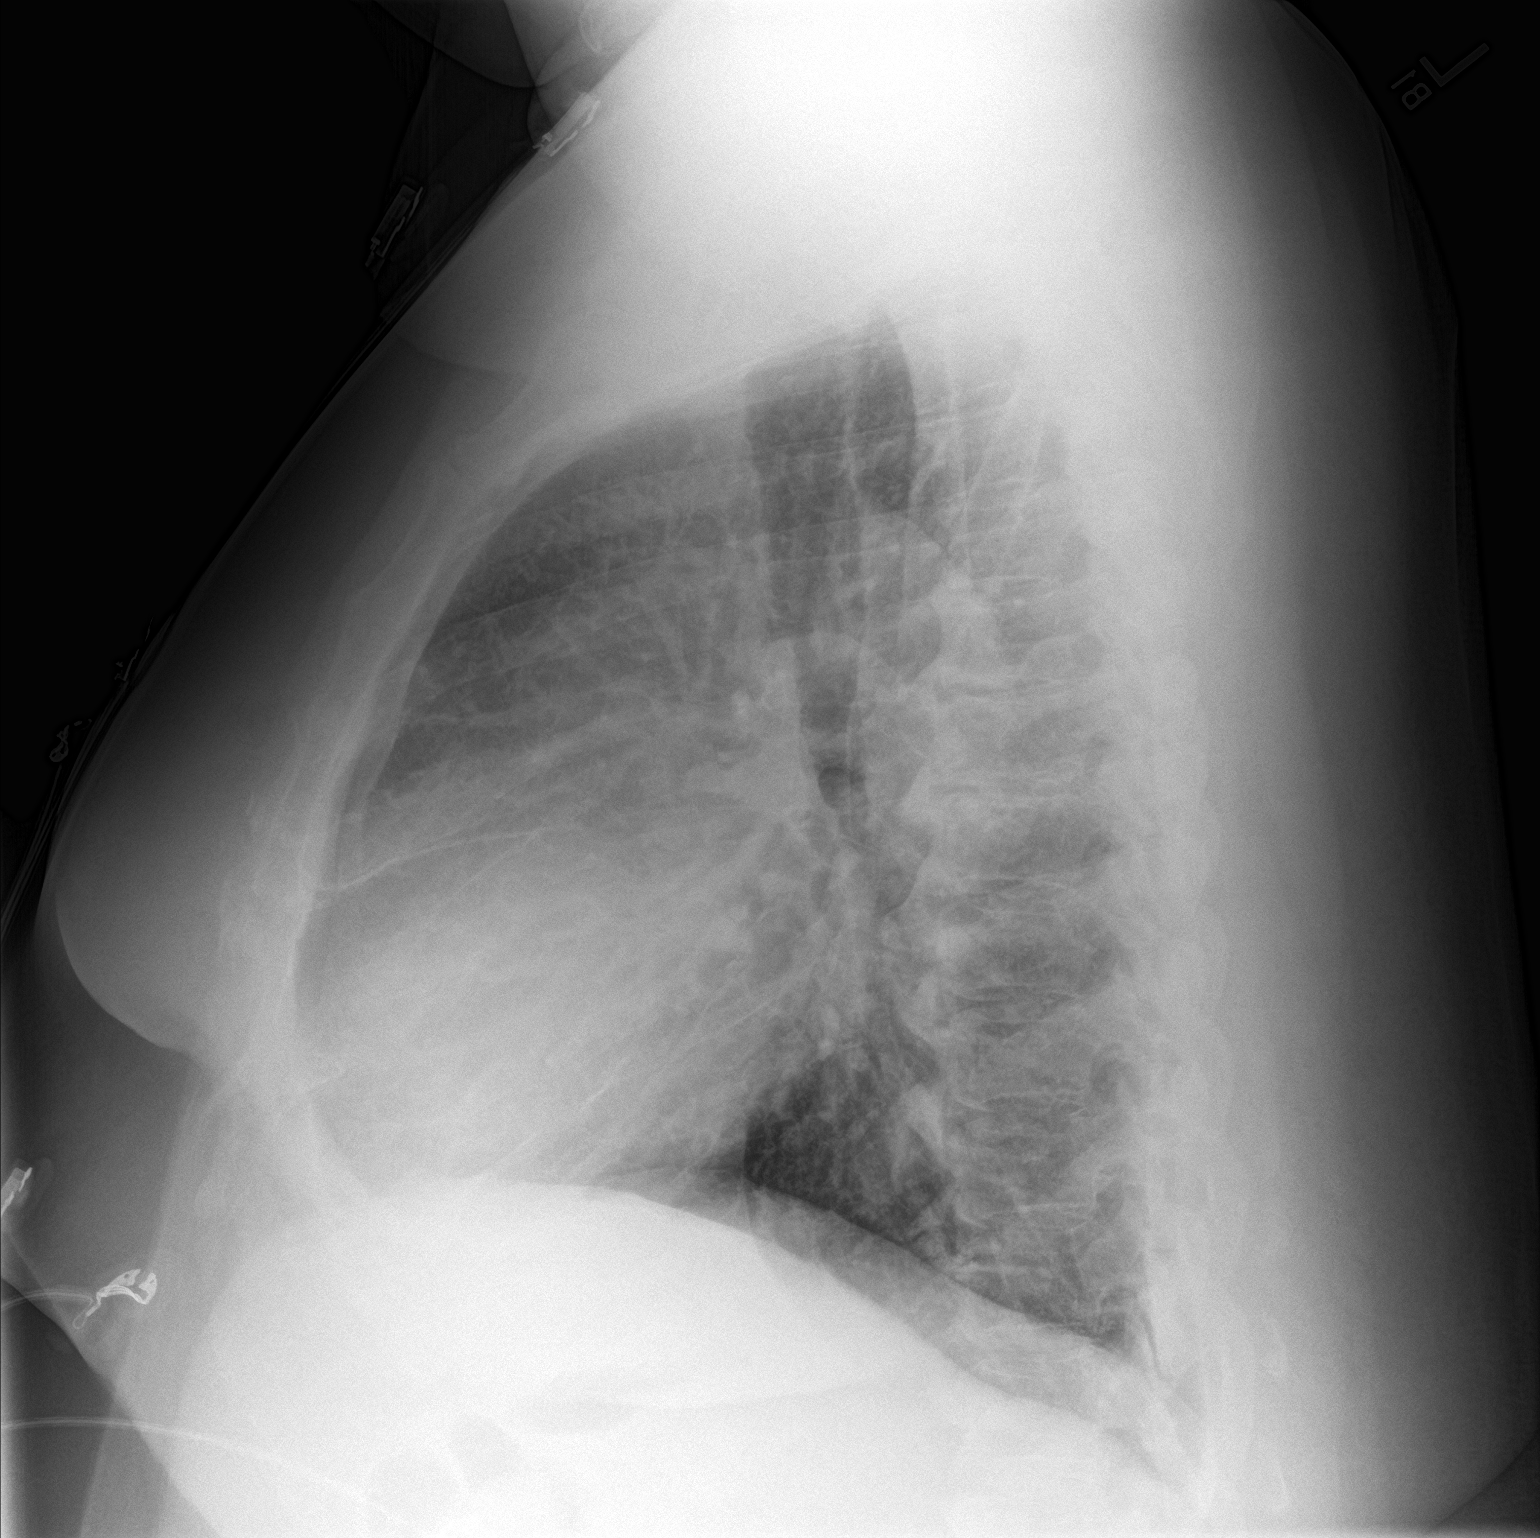

[2 of 2 positions shown; findings below may reference images not displayed]

FINDINGS: No pleural effusion or focal airspace disease. Borderline to mild
cardiomegaly with vascular congestion and mild pulmonary edema. No
pneumothorax.
IMPRESSION: Borderline heart size with vascular congestion and mild pulmonary
edema.

## 2020-01-05 ENCOUNTER — Other Ambulatory Visit (HOSPITAL_COMMUNITY): Payer: Self-pay | Admitting: Radiology

## 2020-01-05 MED FILL — CIPROFLOXACIN HCL 500 MG TA: 500 | 5 days supply | Qty: 10 | Fill #0

## 2020-01-10 ENCOUNTER — Other Ambulatory Visit (HOSPITAL_COMMUNITY): Payer: Self-pay | Admitting: Radiology

## 2020-01-10 MED FILL — SULFAMETHOXAZOLE-TMP DS TAB: 800-160 | 10 days supply | Qty: 20 | Fill #0

## 2020-01-17 MED FILL — METFORMIN HCL 500 MG TABS: 500 | 90 days supply | Qty: 180 | Fill #1

## 2020-01-17 MED FILL — JARDIANCE 10 MG TABLET: 10 | 30 days supply | Qty: 30 | Fill #2

## 2020-01-24 LAB — HM DIABETES EYE EXAM

## 2020-01-25 MED FILL — JARDIANCE 10 MG TABLET: 10 | 30 days supply | Qty: 30 | Fill #2

## 2020-01-26 ENCOUNTER — Other Ambulatory Visit: Payer: Self-pay | Admitting: Nurse Practitioner

## 2020-01-26 DIAGNOSIS — M170 Bilateral primary osteoarthritis of knee: Secondary | ICD-10-CM

## 2020-01-26 MED ORDER — TRAMADOL HCL 50 MG PO TABS: 50.0000 mg | ORAL_TABLET | Freq: Four times a day (QID) | ORAL | 0 refills | Status: DC | PRN

## 2020-01-26 MED FILL — traMADol HCL 50 MG TABS: 50 | 8 days supply | Qty: 30 | Fill #0

## 2020-01-26 NOTE — Addendum Note (Signed)
Addended by: Bonney Leitz T on: 01/26/2020 08:46 AM   Modules accepted: Orders

## 2020-01-26 NOTE — Telephone Encounter (Signed)
Last filled 10/25/19 Last OV 09/15/19 Last labs 9/20 No appt scheduled  Contract 01/27/19

## 2020-01-26 NOTE — Addendum Note (Signed)
Addended by: [name] on: 01/26/2020 09:44 AM   Modules accepted: Orders

## 2020-01-30 ENCOUNTER — Telehealth: Payer: Self-pay | Admitting: *Deleted

## 2020-01-30 DIAGNOSIS — I10 Essential (primary) hypertension: Secondary | ICD-10-CM

## 2020-01-30 NOTE — Telephone Encounter (Signed)
Patient called back and Confirmed he is taking 75mg  Three times daily.   Patient did not want to schedule and appointment yet due to going to so many appointments with his Urologist. Stated that he has to go again this Thursday. Stated that he will call back to schedule an appointment with Janett Billow.

## 2020-01-30 NOTE — Telephone Encounter (Signed)
Received refill Request from [name] requesting refill on Hydralazine 25 mg.   Patient is stating to the pharmacy that he is taking 3 tablets 3 times daily and needs a new Rx for this.  (Current Medication list has two different directions One three times daily and 1.5 three times daily)  Patient is in need of an appointment.  Tried calling patient to schedule a follow up appointment and confirm dosage.   LMOM to return call. Awaiting callback.

## 2020-02-02 ENCOUNTER — Other Ambulatory Visit: Payer: Self-pay | Admitting: Nurse Practitioner

## 2020-02-02 MED ORDER — HYDRALAZINE HCL 50 MG PO TABS: 75.0000 mg | ORAL_TABLET | Freq: Three times a day (TID) | ORAL | 0 refills | Status: DC

## 2020-02-02 MED FILL — hydrALAZINE HCL 50 MG TABS: 50 | 30 days supply | Qty: 135 | Fill #0

## 2020-02-02 NOTE — Telephone Encounter (Signed)
Patient is calling again requesting his refill to be sent to pharmacy before this weekend. Please Advise.

## 2020-02-02 NOTE — Telephone Encounter (Signed)
Refill provided and sent to [facility].

## 2020-02-22 MED FILL — JARDIANCE 10 MG TABLET: 10 | 30 days supply | Qty: 30 | Fill #3

## 2020-03-07 ENCOUNTER — Other Ambulatory Visit: Payer: Self-pay | Admitting: Nurse Practitioner

## 2020-03-07 DIAGNOSIS — I10 Essential (primary) hypertension: Secondary | ICD-10-CM

## 2020-03-07 DIAGNOSIS — M170 Bilateral primary osteoarthritis of knee: Secondary | ICD-10-CM

## 2020-03-07 NOTE — Telephone Encounter (Signed)
Patient has request a refill on medication "Tramadol 50 mg". Patient last refill was 01/26/2020 with 30 tablets to be taken once every 6 hours as needed for pain. Medication pend and sent to PCP [name], NP . Please Advise.

## 2020-03-14 ENCOUNTER — Other Ambulatory Visit: Payer: Self-pay | Admitting: Nurse Practitioner

## 2020-03-14 DIAGNOSIS — I10 Essential (primary) hypertension: Secondary | ICD-10-CM

## 2020-03-15 ENCOUNTER — Other Ambulatory Visit: Payer: Self-pay | Admitting: Nurse Practitioner

## 2020-03-15 ENCOUNTER — Ambulatory Visit (INDEPENDENT_AMBULATORY_CARE_PROVIDER_SITE_OTHER): Payer: Medicare HMO | Admitting: Nurse Practitioner

## 2020-03-15 ENCOUNTER — Encounter: Payer: Self-pay | Admitting: Nurse Practitioner

## 2020-03-15 ENCOUNTER — Other Ambulatory Visit: Payer: Self-pay

## 2020-03-15 VITALS — BP 140/90 | HR 63 | Temp 98.2°F | Ht 69.0 in | Wt 320.2 lb

## 2020-03-15 DIAGNOSIS — E1122 Type 2 diabetes mellitus with diabetic chronic kidney disease: Secondary | ICD-10-CM

## 2020-03-15 DIAGNOSIS — M170 Bilateral primary osteoarthritis of knee: Secondary | ICD-10-CM | POA: Diagnosis not present

## 2020-03-15 DIAGNOSIS — I10 Essential (primary) hypertension: Secondary | ICD-10-CM

## 2020-03-15 DIAGNOSIS — Z23 Encounter for immunization: Secondary | ICD-10-CM

## 2020-03-15 DIAGNOSIS — I5042 Chronic combined systolic (congestive) and diastolic (congestive) heart failure: Secondary | ICD-10-CM | POA: Diagnosis not present

## 2020-03-15 DIAGNOSIS — D500 Iron deficiency anemia secondary to blood loss (chronic): Secondary | ICD-10-CM | POA: Diagnosis not present

## 2020-03-15 DIAGNOSIS — Z79899 Other long term (current) drug therapy: Secondary | ICD-10-CM

## 2020-03-15 MED ORDER — FUROSEMIDE 40 MG PO TABS
40.0000 mg | ORAL_TABLET | Freq: Every day | ORAL | 1 refills | Status: DC
Start: 2020-03-15 — End: 2020-03-15

## 2020-03-15 MED ORDER — TRAMADOL HCL 50 MG PO TABS: 50.0000 mg | ORAL_TABLET | Freq: Four times a day (QID) | ORAL | 0 refills | Status: DC | PRN

## 2020-03-15 MED ORDER — HYDRALAZINE HCL 25 MG PO TABS
75.0000 mg | ORAL_TABLET | Freq: Three times a day (TID) | ORAL | 1 refills | Status: DC
Start: 2020-03-15 — End: 2020-03-15

## 2020-03-15 MED ORDER — LOSARTAN POTASSIUM 50 MG PO TABS: 50.0000 mg | ORAL_TABLET | Freq: Every day | ORAL | 3 refills | Status: DC

## 2020-03-15 MED FILL — LOSARTAN POTASSIUM 50 MG TA: 50 | 30 days supply | Qty: 30 | Fill #0

## 2020-03-15 MED FILL — traMADol HCL 50 MG TABS: 50 | 7 days supply | Qty: 30 | Fill #0

## 2020-03-15 MED FILL — FUROSEMIDE 40 MG TAB: 40 | 30 days supply | Qty: 30 | Fill #0

## 2020-03-15 NOTE — Patient Instructions (Signed)
Goal blood pressure is less than 140/90  Restart losartan 50 mg by mouth daily for blood pressure and kidney protection.

## 2020-03-15 NOTE — Progress Notes (Signed)
Careteam: Patient Care Team: [name], NP as PCP - General (Geriatric Medicine) Jettie Booze, MD as PCP - Cardiology (Cardiology) Katy Fitch, Darlina Guys, MD as Consulting Physician (Ophthalmology)  PLACE OF SERVICE:  Indust[facility]  Advanced Directive information    Allergies  Allergen Reactions  . Lisinopril Cough    Chief Complaint  Patient presents with  . Follow-up    Follow up on blood pressure.Patient would like to discuss hydralazine. Patient would like to know if he could just change to $RemoveB'25mg'agyHbbVU$  and just take 3 tablets instead of cutting tablet in half.Patient states that he doesn't think that pantoprazole is working.Patient now has suprapubic catheter. Discuss need for Tetanus/Tdap, Flu vaccine, colonoscopy and foot exam.     HPI: Patient is a 70 y.o. male for routine follow up. Pt with hx of CKD, Diabetes with neuropathy, DVT/PE, a fib, CHF  Pt with hx of urethral stricture with recurrent UTI and pain. He was taken to the OR emergently for catheter placement but unable to place catheter and now with suprapubic  He is followed by urology at [facility].  He reports he is doing much better since getting the suprapubic.  Reports a lot of blood loss after procedure.   Bilateral knee pain- stable, uses tramadol for severe pain.   DM- up to date on diabetic eye exam. Reports he takes blood sugars at home and they have been well controlled. Continues on glipizide, jardiance and metformin.   htn- has not been on losartan for a while. Making food at home to reduce sodium. Continues on norvasc, coreg and hydralazine.   GERD- does not feel like protonix is working, missed several dose. Does not notice a difference between taking medication and not.   Review of Systems:  Review of Systems  Constitutional: Negative for chills, fever and weight loss.  HENT: Negative for tinnitus.   Respiratory: Negative for cough, sputum production and shortness of breath.    Cardiovascular: Negative for chest pain, palpitations and leg swelling.  Gastrointestinal: Negative for abdominal pain, constipation, diarrhea and heartburn.  Genitourinary:       Suprapubic cath  Musculoskeletal: Positive for joint pain. Negative for back pain, falls and myalgias.  Skin: Negative.   Neurological: Positive for dizziness. Negative for headaches.  Psychiatric/Behavioral: Negative for depression and memory loss. The patient does not have insomnia.     Past Medical History:  Diagnosis Date  . Acute on chronic diastolic CHF (congestive heart failure) ([place]) 06/01/2017  . Arthritis   . Cancer [facility]) 2010   Prostate  . Chronic kidney disease   . Diabetes mellitus   . Diabetic neuropathy (HCC)    feet  . DVT (deep venous thrombosis) ([place])   . Genetic testing 06/22/2016   Mr. Neidig underwent genetic counseling and testing for hereditary cancer syndromes on 05/14/2016. His results were negative for mutations in all 46 genes analyzed by [facility]'s 46-gene Common Hereditary Cancers Panel. Genes analyzed include: APC, ATM, AXIN2, BARD1, BMPR1A, BRCA1, BRCA2, BRIP1, CDH1, CDKN2A, CHEK2, CTNNA1, DICER1, EPCAM, GREM1, HOXB13, KIT, MEN1, MLH1, MSH2, MSH3, MSH6, MUTYH, NB  . GERD (gastroesophageal reflux disease)   . Hypertension   . PE (pulmonary thromboembolism) ([place])   . Pneumonia   . Sleep apnea    not wearing CPAP  . Suprapubic catheter ([place]) 11/17/2019   Past Surgical History:  Procedure Laterality Date  . CARDIOVERSION N/A 06/03/2017   Procedure: CARDIOVERSION;  Surgeon: Jerline Pain, MD;  Location: [facility];  Service: Cardiovascular;  Laterality: N/A;  . COLONOSCOPY    . COLONOSCOPY WITH PROPOFOL N/A 10/20/2018   Procedure: COLONOSCOPY WITH PROPOFOL;  Surgeon: Thornton Park, MD;  Location: [facility];  Service: Gastroenterology;  Laterality: N/A;  . HERNIA REPAIR    . KNEE SURGERY    . MULTIPLE TOOTH EXTRACTIONS    . POLYPECTOMY  10/20/2018   Procedure:  POLYPECTOMY;  Surgeon: Thornton Park, MD;  Location: [facility];  Service: Gastroenterology;;  . PROSTATE SURGERY    . RADIOACTIVE SEED IMPLANT    . RIGHT/LEFT HEART CATH AND CORONARY ANGIOGRAPHY N/A 07/06/2017   Procedure: RIGHT/LEFT HEART CATH AND CORONARY ANGIOGRAPHY;  Surgeon: Jettie Booze, MD;  Location: [facility];  Service: Cardiovascular;  Laterality: N/A;  . SHOULDER SURGERY    . TOTAL KNEE ARTHROPLASTY Right 11/19/2016   Procedure: RIGHT TOTAL KNEE ARTHROPLASTY;  Surgeon: Leandrew Koyanagi, MD;  Location: [facility];  Service: Orthopedics;  Laterality: Right;  . uretha surgery-2014     Social History:   reports that he has never smoked. He has never used smokeless tobacco. He reports that he does not drink alcohol and does not use drugs.  Family History  Problem Relation Age of Onset  . Breast cancer Mother 72       d.89  . Breast cancer Sister 65       d.30  . Leukemia Brother 18       d.20  . Breast cancer Maternal Aunt 40       d.40s  . Lung cancer Maternal Uncle   . Prostate cancer Paternal Uncle   . Prostate cancer Brother        recurred recently at age 76  . Other Brother 15       spinal tumor  . Cervical cancer Other 22       d.22  . Cancer Sister 26       unspecified type  . Cancer Maternal Uncle 80       unspecified type    Medications: Patient's Medications  New Prescriptions   No medications on file  Previous Medications   ACCU-CHEK SOFTCLIX LANCETS LANCETS    Use to test blood sugar daily. Dx: E11.22   ALCOHOL SWABS (B-D SINGLE USE SWABS REGULAR) PADS    Use in testing blood sugar daily. Dx: E11.22   AMLODIPINE (NORVASC) 10 MG TABLET    Take 1 tablet (10 mg total) by mouth daily. for high blood pressure   ASPIRIN EC 81 MG TABLET    Take 81 mg by mouth daily.   ATORVASTATIN (LIPITOR) 40 MG TABLET    Take 1 tablet (40 mg total) by mouth daily.   BLOOD GLUCOSE CALIBRATION (ACCU-CHEK AVIVA) SOLN    Use once daily as directed dx E11.22    CARVEDILOL (COREG) 12.5 MG TABLET    Take one tablet by mouth twice daily with a meal   ELDERBERRY PO    Take 1 tablet by mouth daily.   EMPAGLIFLOZIN (JARDIANCE) 10 MG TABS TABLET    Take 1 tablet (10 mg total) by mouth daily.   FUROSEMIDE (LASIX) 40 MG TABLET    Take 1 tablet (40 mg total) by mouth daily.   GLIPIZIDE (GLUCOTROL) 5 MG TABLET    Take 1 tablet (5 mg total) by mouth daily before breakfast.   GLUCOSE BLOOD (ACCU-CHEK AVIVA PLUS) TEST STRIP    1 each by Other route as needed for other. Use to test blood sugar daily.  Dx: E11.22   HYDRALAZINE (APRESOLINE) 50 MG TABLET    Take 1.5 tablets (75 mg total) by mouth 3 (three) times daily. NEEDS APPT FOR ADDITIONAL REFILLS   METFORMIN (GLUCOPHAGE) 500 MG TABLET    TAKE 1 TABLET (500 MG TOTAL) BY MOUTH 2 TIMES DAILY WITH A MEAL.   MULTIPLE VITAMINS-MINERALS (EMERGEN-C VITAMIN C PO)    Take 1 tablet by mouth daily.   PANTOPRAZOLE (PROTONIX) 40 MG TABLET    Take 1 tablet (40 mg total) by mouth daily.   POTASSIUM CHLORIDE SA (KLOR-CON) 20 MEQ TABLET    Take 1 tablet (20 mEq total) by mouth 2 (two) times daily.   RIVAROXABAN (XARELTO) 20 MG TABS TABLET    Take 1 tablet (20 mg total) by mouth daily with supper.   SENNA 8.7 MG CHEW    Chew by mouth as needed.   TRAMADOL (ULTRAM) 50 MG TABLET    Take 1 tablet (50 mg total) by mouth every 6 (six) hours as needed. for pain. NEEDS APPT FOR ADDITIONAL REFILLS  Modified Medications   No medications on file  Discontinued Medications   CYCLOBENZAPRINE (FLEXERIL) 5 MG TABLET    Take 1 tablet (5 mg total) by mouth 3 (three) times daily as needed for muscle spasms.   LOSARTAN (COZAAR) 100 MG TABLET    Take 1 tablet (100 mg total) by mouth daily.    Physical Exam:  Vitals:   03/15/20 1136  BP: 140/90  Pulse: 63  Temp: 98.2 F (36.8 C)  TempSrc: Temporal  SpO2: 97%  Weight: (!) 320 lb 3.2 oz (145.2 kg)  Height: $Remove'5\' 9"'KtWOaBA$  (1.753 m)   Body mass index is 47.29 kg/m. Wt Readings from Last 3 Encounters:   03/15/20 (!) 320 lb 3.2 oz (145.2 kg)  09/15/19 (!) 328 lb 9.6 oz (149.1 kg)  09/06/19 (!) 320 lb 3.2 oz (145.2 kg)    Physical Exam Constitutional:      General: He is not in acute distress.    Appearance: He is well-developed and well-nourished. He is not diaphoretic.  HENT:     Head: Normocephalic and atraumatic.     Mouth/Throat:     Mouth: Oropharynx is clear and moist.     Pharynx: No oropharyngeal exudate.  Eyes:     Extraocular Movements: EOM normal.     Conjunctiva/sclera: Conjunctivae normal.     Pupils: Pupils are equal, round, and reactive to light.  Cardiovascular:     Rate and Rhythm: Normal rate and regular rhythm.     Heart sounds: Normal heart sounds.  Pulmonary:     Effort: Pulmonary effort is normal.     Breath sounds: Normal breath sounds.  Abdominal:     General: Bowel sounds are normal.     Palpations: Abdomen is soft.  Musculoskeletal:        General: No tenderness or edema.     Cervical back: Normal range of motion and neck supple.  Skin:    General: Skin is warm and dry.  Neurological:     Mental Status: He is alert and oriented to person, place, and time.  Psychiatric:        Mood and Affect: Mood and affect and mood normal.        Behavior: Behavior normal.     Labs reviewed: Basic Metabolic Panel: Recent Labs    05/11/19 1024 09/06/19 0913 11/06/19 0852  NA 144 141 143  K 3.4* 3.3* 3.5  CL 105 105 101  CO2 '26 26 28  '$ GLUCOSE 209* 204* 164*  BUN $Re'9 18 12  'QWY$ CREATININE 1.04 1.15 0.95  CALCIUM 9.0 9.2 8.6  TSH  --   --  0.95   Liver Function Tests: Recent Labs    05/11/19 1024 11/06/19 0852  AST 13 11  ALT 16 10  BILITOT 0.8 1.0  PROT 7.4 7.4   No results for input(s): LIPASE, AMYLASE in the last 8760 hours. No results for input(s): AMMONIA in the last 8760 hours. CBC: Recent Labs    05/11/19 1024 11/06/19 0852  WBC 7.3 9.0  NEUTROABS 5,672  --   HGB 14.0 12.6*  HCT 43.1 39.3  MCV 83.5 83.3  PLT 332 393   Lipid  Panel: Recent Labs    09/06/19 0913 11/06/19 0852  CHOL 172 118  HDL 49 37*  LDLCALC 105* 65  TRIG 86 80  CHOLHDL 3.5 3.2   TSH: Recent Labs    11/06/19 0852  TSH 0.95   A1C: Lab Results  Component Value Date   HGBA1C 6.9 (H) 11/06/2019     Assessment/Plan 1. Essential hypertension -mildly elevated today, will restart losartan for renal protection and better control. Continue dash diet.  - hydrALAZINE (APRESOLINE) 25 MG tablet; Take 3 tablets (75 mg total) by mouth 3 (three) times daily. NEEDS APPT FOR ADDITIONAL REFILLS  Dispense: 810 tablet; Refill: 1 - losartan (COZAAR) 50 MG tablet; Take 1 tablet (50 mg total) by mouth daily.  Dispense: 90 tablet; Refill: 3  2. Primary osteoarthritis of both knees -stable, continues on tramadol PRN severe pain. Also uses tylenol PRN.  - traMADol (ULTRAM) 50 MG tablet; Take 1 tablet (50 mg total) by mouth every 6 (six) hours as needed. For pain  Dispense: 30 tablet; Refill: 0  3. Chronic combined systolic and diastolic congestive heart failure (HCC) Stable, without worsening swelling, shortness of breath or chest pains. Continues on coreg, lasix, will restart losartan - furosemide (LASIX) 40 MG tablet; Take 1 tablet (40 mg total) by mouth daily.  Dispense: 90 tablet; Refill: 1  4. Blood loss anemia -from having suprapubic placed, will follow up blood counts at this time.  - CBC with Differential/Platelet  5. Type 2 diabetes mellitus with chronic kidney disease, without long-term current use of insulin, unspecified CKD stage ([place]) -Encouraged dietary compliance, routine foot care/monitoring and to keep up with diabetic eye exams through ophthalmology  -will restart losartan for renal protection. - losartan (COZAAR) 50 MG tablet; Take 1 tablet (50 mg total) by mouth daily.  Dispense: 90 tablet; Refill: 3 - Hemoglobin A1c - CMP with eGFR([facility])  6. High risk medication use -continues on tramadol. Updated contract in office.  - Urine  Drug Screen w/Alc, no confirm([facility])  7. Need for influenza vaccination - Flu Vaccine QUAD High Dose([facility])   Next appt: 4 months.  Carlos American. [place], [facility] [phone]

## 2020-03-16 LAB — COMPLETE METABOLIC PANEL WITH GFR
AG Ratio: 1.2 (calc) (ref 1.0–2.5)
ALT: 15 U/L (ref 9–46)
AST: 14 U/L (ref 10–35)
Albumin: 4.1 g/dL (ref 3.6–5.1)
Alkaline phosphatase (APISO): 55 U/L (ref 35–144)
BUN: 10 mg/dL (ref 7–25)
CO2: 22 mmol/L (ref 20–32)
Calcium: 9.3 mg/dL (ref 8.6–10.3)
Chloride: 107 mmol/L (ref 98–110)
Creat: 1.12 mg/dL (ref 0.70–1.25)
GFR, Est African American: 77 mL/min/{1.73_m2} (ref >=60)
GFR, Est Non African American: 67 mL/min/{1.73_m2} (ref >=60)
Globulin: 3.3 g/dL (calc) (ref 1.9–3.7)
Glucose, Bld: 102 mg/dL (ref 65–139)
Potassium: 4.3 mmol/L (ref 3.5–5.3)
Sodium: 142 mmol/L (ref 135–146)
Total Bilirubin: 0.6 mg/dL (ref 0.2–1.2)
Total Protein: 7.4 g/dL (ref 6.1–8.1)

## 2020-03-16 LAB — CBC WITH DIFFERENTIAL/PLATELET
Absolute Monocytes: 496 cells/uL (ref 200–950)
Basophils Absolute: 59 cells/uL (ref 0–200)
Basophils Relative: 0.8 %
Eosinophils Absolute: 89 cells/uL (ref 15–500)
Eosinophils Relative: 1.2 %
HCT: 43.9 % (ref 38.5–50.0)
Hemoglobin: 14.3 g/dL (ref 13.2–17.1)
Lymphs Abs: 1473 cells/uL (ref 850–3900)
MCH: 27.4 pg (ref 27.0–33.0)
MCHC: 32.6 g/dL (ref 32.0–36.0)
MCV: 84.3 fL (ref 80.0–100.0)
MPV: 10.6 fL (ref 7.5–12.5)
Monocytes Relative: 6.7 %
Neutro Abs: 5284 cells/uL (ref 1500–7800)
Neutrophils Relative %: 71.4 %
Platelets: 321 10*3/uL (ref 140–400)
RBC: 5.21 10*6/uL (ref 4.20–5.80)
RDW: 14.9 % (ref 11.0–15.0)
Total Lymphocyte: 19.9 %
WBC: 7.4 10*3/uL (ref 3.8–10.8)

## 2020-03-16 LAB — DM TEMPLATE

## 2020-03-16 LAB — DRUG MONITOR, PANEL 1, W/CONF, URINE
Amphetamines: NEGATIVE ng/mL (ref <500)
Barbiturates: NEGATIVE ng/mL (ref <300)
Benzodiazepines: NEGATIVE ng/mL (ref <100)
Cocaine Metabolite: NEGATIVE ng/mL (ref <150)
Creatinine: 152.7 mg/dL
Marijuana Metabolite: NEGATIVE ng/mL (ref <20)
Methadone Metabolite: NEGATIVE ng/mL (ref <100)
Opiates: NEGATIVE ng/mL (ref <100)
Oxidant: NEGATIVE ug/mL
Oxycodone: NEGATIVE ng/mL (ref <100)
Phencyclidine: NEGATIVE ng/mL (ref <25)
pH: 6.3 (ref 4.5–9.0)

## 2020-03-16 LAB — HEMOGLOBIN A1C
Hgb A1c MFr Bld: 5.7 % of total Hgb — ABNORMAL HIGH (ref <5.7)
Mean Plasma Glucose: 117 mg/dL
eAG (mmol/L): 6.5 mmol/L

## 2020-03-19 MED FILL — hydrALAZINE HCL 25 MG TABS: 25 | 30 days supply | Qty: 270 | Fill #0

## 2020-04-16 ENCOUNTER — Other Ambulatory Visit: Payer: Self-pay | Admitting: Nurse Practitioner

## 2020-04-16 DIAGNOSIS — M170 Bilateral primary osteoarthritis of knee: Secondary | ICD-10-CM

## 2020-04-16 MED FILL — LOSARTAN POTASSIUM 50 MG TA: 50 | 30 days supply | Qty: 30 | Fill #1

## 2020-04-16 MED FILL — JARDIANCE 10 MG TABLET: 10 | 30 days supply | Qty: 30 | Fill #4

## 2020-04-16 MED FILL — traMADol HCL 50 MG TABS: 50 | 7 days supply | Qty: 30 | Fill #0

## 2020-04-16 NOTE — Telephone Encounter (Signed)
RX last filled in Epic on 03/15/2020, Treatment agreement dated 01/27/2019. No pending appointment scheduled.

## 2020-04-22 MED FILL — FUROSEMIDE 40 MG TAB: 40 | 30 days supply | Qty: 30 | Fill #1

## 2020-04-24 MED FILL — hydrALAZINE HCL 25 MG TABS: 25 | 30 days supply | Qty: 270 | Fill #1

## 2020-04-24 MED FILL — CARVEDILOL 6.25 MG TABLET: 6.25 | 30 days supply | Qty: 60 | Fill #0

## 2020-05-16 ENCOUNTER — Other Ambulatory Visit: Payer: Self-pay | Admitting: Nurse Practitioner

## 2020-05-16 DIAGNOSIS — E1122 Type 2 diabetes mellitus with diabetic chronic kidney disease: Secondary | ICD-10-CM

## 2020-05-18 ENCOUNTER — Other Ambulatory Visit (HOSPITAL_COMMUNITY): Payer: Self-pay

## 2020-05-18 MED FILL — Empagliflozin Tab 10 MG: ORAL | 30 days supply | Qty: 30 | Fill #0 | Status: AC

## 2020-05-18 MED FILL — Metformin HCl Tab 500 MG: ORAL | 90 days supply | Qty: 180 | Fill #0 | Status: AC

## 2020-05-21 ENCOUNTER — Other Ambulatory Visit: Payer: Self-pay | Admitting: Nurse Practitioner

## 2020-05-21 ENCOUNTER — Other Ambulatory Visit (HOSPITAL_COMMUNITY): Payer: Self-pay

## 2020-05-21 DIAGNOSIS — M170 Bilateral primary osteoarthritis of knee: Secondary | ICD-10-CM

## 2020-05-21 MED ORDER — TRAMADOL HCL 50 MG PO TABS
ORAL_TABLET | ORAL | 0 refills | Status: DC
  Filled 2020-05-21: qty 30, 30d supply, fill #0

## 2020-05-21 MED FILL — Losartan Potassium Tab 50 MG: ORAL | 90 days supply | Qty: 90 | Fill #0 | Status: AC

## 2020-05-21 NOTE — Telephone Encounter (Signed)
Pharmacy requested refill Epic LR: 04/16/2020 Needs an appointment before anymore future refills. Needs updated contract.  Pended Rx and sent to [facility] for approval.

## 2020-06-03 ENCOUNTER — Other Ambulatory Visit (HOSPITAL_COMMUNITY): Payer: Self-pay

## 2020-06-03 MED FILL — Hydralazine HCl Tab 25 MG: ORAL | 90 days supply | Qty: 810 | Fill #0 | Status: CN

## 2020-06-04 ENCOUNTER — Other Ambulatory Visit (HOSPITAL_COMMUNITY): Payer: Self-pay

## 2020-06-04 MED FILL — Hydralazine HCl Tab 25 MG: ORAL | 90 days supply | Qty: 810 | Fill #0 | Status: AC

## 2020-06-18 ENCOUNTER — Other Ambulatory Visit (HOSPITAL_COMMUNITY): Payer: Self-pay

## 2020-06-18 ENCOUNTER — Other Ambulatory Visit: Payer: Self-pay | Admitting: Nurse Practitioner

## 2020-06-18 DIAGNOSIS — E1122 Type 2 diabetes mellitus with diabetic chronic kidney disease: Secondary | ICD-10-CM

## 2020-06-18 DIAGNOSIS — M170 Bilateral primary osteoarthritis of knee: Secondary | ICD-10-CM

## 2020-06-18 MED ORDER — TRAMADOL HCL 50 MG PO TABS
ORAL_TABLET | ORAL | 0 refills | Status: DC
  Filled 2020-06-18: qty 30, 7d supply, fill #0

## 2020-06-18 MED ORDER — EMPAGLIFLOZIN 10 MG PO TABS
10.0000 mg | ORAL_TABLET | Freq: Every day | ORAL | 5 refills | Status: DC
  Filled 2020-06-18: qty 30, 30d supply, fill #0
  Filled 2020-07-17: qty 30, 30d supply, fill #1
  Filled 2020-08-22: qty 30, 30d supply, fill #2

## 2020-06-18 MED FILL — Carvedilol Tab 6.25 MG: ORAL | 30 days supply | Qty: 60 | Fill #0 | Status: AC

## 2020-06-18 MED FILL — Furosemide Tab 40 MG: ORAL | 90 days supply | Qty: 90 | Fill #0 | Status: AC

## 2020-07-17 ENCOUNTER — Other Ambulatory Visit: Payer: Self-pay | Admitting: Nurse Practitioner

## 2020-07-17 ENCOUNTER — Other Ambulatory Visit (HOSPITAL_COMMUNITY): Payer: Self-pay

## 2020-07-17 DIAGNOSIS — M170 Bilateral primary osteoarthritis of knee: Secondary | ICD-10-CM

## 2020-07-17 MED ORDER — TRAMADOL HCL 50 MG PO TABS
ORAL_TABLET | ORAL | 0 refills | Status: DC
  Filled 2020-07-17: qty 30, 8d supply, fill #0

## 2020-07-17 MED FILL — Carvedilol Tab 6.25 MG: ORAL | 30 days supply | Qty: 60 | Fill #1 | Status: AC

## 2020-07-17 NOTE — Telephone Encounter (Signed)
Patient has request refill on medication "Tramadol 50mg ". Medication last refilled on 06/18/2020. Patient is due for refill. Medication pend and sent to PCP [name], NP . Please Advise.

## 2020-08-22 ENCOUNTER — Other Ambulatory Visit (HOSPITAL_COMMUNITY): Payer: Self-pay

## 2020-08-22 ENCOUNTER — Other Ambulatory Visit: Payer: Self-pay

## 2020-08-22 ENCOUNTER — Other Ambulatory Visit: Payer: Self-pay | Admitting: Nurse Practitioner

## 2020-08-22 ENCOUNTER — Telehealth: Payer: Medicare HMO | Admitting: Nurse Practitioner

## 2020-08-22 DIAGNOSIS — E1122 Type 2 diabetes mellitus with diabetic chronic kidney disease: Secondary | ICD-10-CM

## 2020-08-22 DIAGNOSIS — M170 Bilateral primary osteoarthritis of knee: Secondary | ICD-10-CM

## 2020-08-22 DIAGNOSIS — I10 Essential (primary) hypertension: Secondary | ICD-10-CM

## 2020-08-22 MED ORDER — TRAMADOL HCL 50 MG PO TABS
50.0000 mg | ORAL_TABLET | Freq: Four times a day (QID) | ORAL | 0 refills | Status: DC | PRN
  Filled 2020-08-22: qty 30, 8d supply, fill #0

## 2020-08-22 MED FILL — Furosemide Tab 40 MG: ORAL | 90 days supply | Qty: 90 | Fill #1 | Status: CN

## 2020-08-22 MED FILL — Losartan Potassium Tab 50 MG: ORAL | 90 days supply | Qty: 90 | Fill #1 | Status: AC

## 2020-08-22 NOTE — Telephone Encounter (Signed)
RX last filled on 07/17/2020  No treatment agreement on file and on pending appointment.   I called patient to schedule an appointment, patient states he can not come in due to issues with his legs. I offered patient a video visit, scheduled for today at 2:15 pm  Patient states at the appointment today he would like to discuss why his carvedilol dose was changed. Patient wants to know why and who changed medication.

## 2020-08-22 NOTE — Telephone Encounter (Signed)
Per [name], NP only refill for #30  (Message was communicated via a secure chat)    High risk or very high risk warning populated when attempting to refill medication. RX request sent to PCP for review and approval if warranted.

## 2020-08-22 NOTE — Telephone Encounter (Signed)
Called patient to let him know he is due for an in office visit, he agreed and states he will call back to schedule

## 2020-08-28 ENCOUNTER — Other Ambulatory Visit (HOSPITAL_COMMUNITY): Payer: Self-pay

## 2020-08-28 ENCOUNTER — Other Ambulatory Visit: Payer: Self-pay

## 2020-08-29 ENCOUNTER — Other Ambulatory Visit (HOSPITAL_COMMUNITY): Payer: Self-pay

## 2020-08-30 ENCOUNTER — Other Ambulatory Visit: Payer: Self-pay

## 2020-08-30 ENCOUNTER — Other Ambulatory Visit (HOSPITAL_COMMUNITY): Payer: Self-pay

## 2020-09-02 ENCOUNTER — Encounter: Payer: Self-pay | Admitting: Family

## 2020-09-02 ENCOUNTER — Ambulatory Visit
Admission: RE | Admit: 2020-09-02 | Discharge: 2020-09-02 | Disposition: A | Discharge status: Home / Self Care | Payer: Medicare HMO | Source: Ambulatory Visit | Attending: Family | Admitting: Family

## 2020-09-02 ENCOUNTER — Other Ambulatory Visit (HOSPITAL_COMMUNITY): Payer: Self-pay

## 2020-09-02 ENCOUNTER — Ambulatory Visit (INDEPENDENT_AMBULATORY_CARE_PROVIDER_SITE_OTHER): Payer: Medicare HMO | Admitting: Family

## 2020-09-02 ENCOUNTER — Other Ambulatory Visit: Payer: Self-pay

## 2020-09-02 ENCOUNTER — Telehealth: Payer: Self-pay

## 2020-09-02 VITALS — BP 138/90 | HR 61 | Temp 98.4°F | Resp 18 | Ht 69.0 in

## 2020-09-02 DIAGNOSIS — M545 Low back pain, unspecified: Secondary | ICD-10-CM | POA: Diagnosis not present

## 2020-09-02 MED ORDER — METHOCARBAMOL 500 MG PO TABS
500.0000 mg | ORAL_TABLET | Freq: Three times a day (TID) | ORAL | 0 refills | Status: DC | PRN
  Filled 2020-09-02: qty 30, 10d supply, fill #0

## 2020-09-02 NOTE — Progress Notes (Signed)
Provider: Lametria Klunk FNP-C  [name], NP  Patient Care Team: [name], NP as PCP - General (Geriatric Medicine) Jettie Booze, MD as PCP - Cardiology (Cardiology) Katy Fitch, Darlina Guys, MD as Consulting Physician (Ophthalmology)  Extended Emergency Contact Information Primary Emergency Contact: [facility] Address: [address]          [place], [place] [address] John[place]te Litter of [place] Phone: [phone] Mobile Phone: [phone] Relation: Spouse Secondary Emergency Contact: Hankins,Kabirah A Address: [address], [place] [address] [place] Phone: [phone] Relation: Daughter  Code Status:  Full Code  Goals of care: Advanced Directive information Advanced Directives 09/02/2020  Does Patient Have a Medical Advance Directive? No  Type of Advance Directive -  Does patient want to make changes to medical advance directive? -  Copy of [place] in Chart? -  Would patient like information on creating a medical advance directive? No - Patient declined     Chief Complaint  Patient presents with   Acute Visit    Complains of back pain x 2 weeks.    HPI:  Pt is a 70 y.o. male seen today for an acute visit for evaluation of lower back pain x 2 weeks.states was sitting on his wheelchair bend down to pick up a case of water to place in the refrigerator when he felt pain on the back.No popping sound.   Went to use the restroom could not movement. Rates pain 7/10 on scale Pain worst with movement  Pain is excruciating and spasm Like muscle sprain. Stayed in bed the whole day with much improvement.  He denies any numbness/tingling,weakness or loss of bowel.Has suprapubic catheter.  Has taken tramadol,Extra strength tylenol and had used rubbing gel.   Past Medical History:  Diagnosis Date   Acute on chronic diastolic CHF (congestive heart failure) ([facility]) 06/01/2017   Arthritis     Cancer ([facility]) 2010   Prostate   Chronic kidney disease    Diabetes mellitus    Diabetic neuropathy ([facility] [place])    feet   DVT (deep venous thrombosis) ([facility])    Genetic testing 06/22/2016   Mr. Calvin underwent genetic counseling and testing for hereditary cancer syndromes on 05/14/2016. His results were negative for mutations in all 46 genes analyzed by [facility]'s 46-gene Common Hereditary Cancers Panel. Genes analyzed include: APC, ATM, AXIN2, BARD1, BMPR1A, BRCA1, BRCA2, BRIP1, CDH1, CDKN2A, CHEK2, CTNNA1, DICER1, EPCAM, GREM1, HOXB13, KIT, MEN1, MLH1, MSH2, MSH3, MSH6, MUTYH, NB   GERD (gastroesophageal reflux disease)    Hypertension    PE (pulmonary thromboembolism) ([facility])    Pneumonia    Sleep apnea    not wearing CPAP   Suprapubic catheter ([place] [facility]) 11/17/2019   Past Surgical History:  Procedure Laterality Date   CARDIOVERSION N/A 06/03/2017   Procedure: CARDIOVERSION;  Surgeon: Jerline Pain, MD;  Location: [facility];  Service: Cardiovascular;  Laterality: N/A;   COLONOSCOPY     COLONOSCOPY WITH PROPOFOL N/A 10/20/2018   Procedure: COLONOSCOPY WITH PROPOFOL;  Surgeon: Thornton Park, MD;  Location: [facility];  Service: Gastroenterology;  Laterality: N/A;   HERNIA REPAIR     KNEE SURGERY     MULTIPLE TOOTH EXTRACTIONS     POLYPECTOMY  10/20/2018   Procedure: POLYPECTOMY;  Surgeon: Thornton Park, MD;  Location: [facility];  Service: Gastroenterology;;   PROSTATE SURGERY     RADIOACTIVE SEED IMPLANT  RIGHT/LEFT HEART CATH AND CORONARY ANGIOGRAPHY N/A 07/06/2017   Procedure: RIGHT/LEFT HEART CATH AND CORONARY ANGIOGRAPHY;  Surgeon: Jettie Booze, MD;  Location: [facility];  Service: Cardiovascular;  Laterality: N/A;   SHOULDER SURGERY     TOTAL KNEE ARTHROPLASTY Right 11/19/2016   Procedure: RIGHT TOTAL KNEE ARTHROPLASTY;  Surgeon: Leandrew Koyanagi, MD;  Location: [facility];  Service: Orthopedics;  Laterality: Right;   uretha surgery-2014      Allergies  Allergen  Reactions   Lisinopril Cough    Outpatient Encounter Medications as of 09/02/2020  Medication Sig   Accu-Chek Softclix Lancets lancets Use to test blood sugar daily. Dx: E11.22   Alcohol Swabs (B-D SINGLE USE SWABS REGULAR) PADS Use in testing blood sugar daily. Dx: E11.22   amLODipine (NORVASC) 10 MG tablet TAKE 1 TABLET EVERY DAY FOR HIGH BLOOD PRESSURE   aspirin EC 81 MG tablet Take 81 mg by mouth daily.   atorvastatin (LIPITOR) 40 MG tablet Take 1 tablet (40 mg total) by mouth daily.   Blood Glucose Calibration (ACCU-CHEK AVIVA) SOLN Use once daily as directed dx E11.22   carvedilol (COREG) 12.5 MG tablet Take one tablet by mouth twice daily with a meal   carvedilol (COREG) 6.25 MG tablet TAKE 1 TABLET BY MOUTH ONCE DAILY IN THE MORNING AND 1 TABLET AT BEDTIME.   ciprofloxacin (CIPRO) 500 MG tablet TAKE 1 TABLET (500 MG TOTAL) BY MOUTH 2 TIMES DAILY FOR 5 DAYS.   ELDERBERRY PO Take 1 tablet by mouth daily.   empagliflozin (JARDIANCE) 10 MG TABS tablet Take 1 tablet (10 mg total) by mouth daily.   furosemide (LASIX) 40 MG tablet TAKE 1 TABLET (40 MG TOTAL) BY MOUTH DAILY.   glipiZIDE (GLUCOTROL) 5 MG tablet TAKE 1 TABLET  BY MOUTH DAILY BEFORE BREAKFAST.   glucose blood (ACCU-CHEK AVIVA PLUS) test strip 1 each by Other route as needed for other. Use to test blood sugar daily. Dx: E11.22   hydrALAZINE (APRESOLINE) 25 MG tablet TAKE 3 TABLETS (75 MG TOTAL) BY MOUTH 3 (THREE) TIMES DAILY. NEEDS APPT FOR ADDITIONAL REFILLS   losartan (COZAAR) 50 MG tablet Take 1 tablet (50 mg total) by mouth daily.   metFORMIN (GLUCOPHAGE) 500 MG tablet TAKE 1 TABLET (500 MG TOTAL) BY MOUTH 2 TIMES DAILY WITH A MEAL.   Multiple Vitamins-Minerals (EMERGEN-C VITAMIN C PO) Take 1 tablet by mouth daily.   pantoprazole (PROTONIX) 40 MG tablet Take 1 tablet (40 mg total) by mouth daily.   potassium chloride SA (KLOR-CON) 20 MEQ tablet Take 1 tablet (20 mEq total) by mouth 2 (two) times daily.   rivaroxaban  (XARELTO) 20 MG TABS tablet Take 1 tablet (20 mg total) by mouth daily with supper.   Senna 8.7 MG CHEW Chew by mouth as needed.   sulfamethoxazole-trimethoprim (BACTRIM DS) 800-160 MG tablet TAKE 1 TABLET BY MOUTH 2 TIMES DAILY FOR 10 DAYS.   traMADol (ULTRAM) 50 MG tablet Take 1 tablet (50 mg total) by mouth every 6 (six) hours as needed for pain   No facility-administered encounter medications on file as of 09/02/2020.    Review of Systems  Constitutional:  Negative for appetite change, chills, fatigue, fever and unexpected weight change.  Eyes:  Negative for pain, discharge, redness, itching and visual disturbance.  Respiratory:  Negative for cough, chest tightness, shortness of breath and wheezing.   Cardiovascular:  Negative for chest pain, palpitations and leg swelling.  Gastrointestinal:  Negative for abdominal distention, abdominal pain,  constipation, diarrhea, nausea and vomiting.  Genitourinary:  Negative for difficulty urinating, dysuria, flank pain, frequency and urgency.  Musculoskeletal:  Positive for arthralgias, back pain and gait problem. Negative for joint swelling and myalgias.  Skin:  Negative for color change, pallor, rash and wound.  Neurological:  Negative for dizziness, syncope, speech difficulty, weakness, light-headedness, numbness and headaches.   Immunization History  Administered Date(s) Administered   Fluad Quad(high Dose 65+) 12/09/2018, 03/15/2020   Influenza, High Dose Seasonal PF 12/14/2016   Influenza,inj,Quad PF,6+ Mos 11/22/2013, 12/05/2014, 10/24/2015   Influenza-Unspecified 11/29/2017   PFIZER(Purple Top)SARS-COV-2 Vaccination 07/29/2019, 08/08/2019, 08/19/2019, 01/25/2020   Pneumococcal Conjugate-13 05/23/2014   Pneumococcal Polysaccharide-23 12/13/2012, 05/21/2016   Pertinent  Health Maintenance Due  Topic Date Due   FOOT EXAM  12/10/2018   HEMOGLOBIN A1C  09/12/2020   INFLUENZA VACCINE  09/16/2020   OPHTHALMOLOGY EXAM  01/23/2021    COLONOSCOPY (Pts 45-57yrs Insurance coverage will need to be confirmed)  10/19/2028   PNA vac Low Risk Adult  Completed   Fall Risk  09/02/2020 09/15/2019 05/11/2019 01/27/2019 12/15/2018  Falls in the past year? 0 0 0 0 0  Number falls in past yr: 0 - 0 0 0  Injury with Fall? 0 - 0 0 0  Comment - - - - -  Risk for fall due to : No Fall Risks - - - -  Follow up Falls evaluation completed - - - -   Functional Status Survey:    Vitals:   09/02/20 1055  BP: 138/90  Pulse: 61  Resp: 18  Temp: 98.4 F (36.9 C)  SpO2: 95%  Height: $Remove'5\' 9"'ImYBFqF$  (1.753 m)   Body mass index is 47.29 kg/m. Physical Exam Vitals reviewed.  Constitutional:      General: He is not in acute distress.    Appearance: Normal appearance. He is morbidly obese. He is not ill-appearing or diaphoretic.  HENT:     Head: Normocephalic.     Nose: Nose normal. No congestion or rhinorrhea.     Mouth/Throat:     Mouth: Mucous membranes are moist.     Pharynx: Oropharynx is clear. No oropharyngeal exudate or posterior oropharyngeal erythema.  Eyes:     General: No scleral icterus.       Right eye: No discharge.        Left eye: No discharge.     Extraocular Movements: Extraocular movements intact.     Conjunctiva/sclera: Conjunctivae normal.     Pupils: Pupils are equal, round, and reactive to light.  Cardiovascular:     Rate and Rhythm: Normal rate and regular rhythm.     Pulses: Normal pulses.     Heart sounds: Normal heart sounds. No murmur heard.   No friction rub. No gallop.  Pulmonary:     Effort: Pulmonary effort is normal. No respiratory distress.     Breath sounds: Normal breath sounds. No wheezing, rhonchi or rales.  Chest:     Chest wall: No tenderness.  Abdominal:     General: Bowel sounds are normal. There is no distension.     Palpations: Abdomen is soft. There is no mass.     Tenderness: There is no abdominal tenderness. There is no right CVA tenderness, left CVA tenderness, guarding or rebound.   Musculoskeletal:        General: No swelling or tenderness. Normal range of motion.     Cervical back: Normal range of motion. No rigidity or tenderness.     Lumbar back:  No swelling, edema, tenderness or bony tenderness. Normal range of motion. Negative right straight leg raise test and negative left straight leg raise test.     Right lower leg: No edema.     Left lower leg: No edema.  Lymphadenopathy:     Cervical: No cervical adenopathy.  Skin:    General: Skin is warm and dry.     Coloration: Skin is not pale.     Findings: No bruising, erythema, lesion or rash.  Neurological:     Mental Status: He is alert and oriented to person, place, and time.     Cranial Nerves: No cranial nerve deficit.     Sensory: No sensory deficit.     Motor: No weakness.     Coordination: Coordination normal.     Gait: Gait abnormal.  Psychiatric:        Mood and Affect: Mood normal.        Speech: Speech normal.        Behavior: Behavior normal.        Thought Content: Thought content normal.        Judgment: Judgment normal.    Labs reviewed: Recent Labs    09/06/19 0913 11/06/19 0852 03/15/20 1216  NA 141 143 142  K 3.3* 3.5 4.3  CL 105 101 107  CO2 $Re'26 28 22  'CsL$ GLUCOSE 204* 164* 102  BUN $Re'18 12 10  'zUb$ CREATININE 1.15 0.95 1.12  CALCIUM 9.2 8.6 9.3   Recent Labs    11/06/19 0852 03/15/20 1216  AST 11 14  ALT 10 15  BILITOT 1.0 0.6  PROT 7.4 7.4   Recent Labs    11/06/19 0852 03/15/20 1216  WBC 9.0 7.4  NEUTROABS  --  5,284  HGB 12.6* 14.3  HCT 39.3 43.9  MCV 83.3 84.3  PLT 393 321   Lab Results  Component Value Date   TSH 0.95 11/06/2019   Lab Results  Component Value Date   HGBA1C 5.7 (H) 03/15/2020   Lab Results  Component Value Date   CHOL 118 11/06/2019   HDL 37 (L) 11/06/2019   LDLCALC 65 11/06/2019   TRIG 80 11/06/2019   CHOLHDL 3.2 11/06/2019    Significant Diagnostic Results in last 30 days:  No results found.  Assessment/Plan  Acute bilateral  low back pain without sciatica Sustained lower back injury while sitting on wheelchair leaned forward to pickup a case of water to put in the refrigerator.No pop sound reported.Exam findings negative for SLR.No loss of bowel control on weakness/ Numbness/tingling on the legs. - continue on Extra strength Tylenol and Tramadol for pain.will add Robaxin as below.Side effects discussed.  Obtain imaging.POA  familiar with imaging center.Address also given on AVS.  - DG Lumbar Spine Complete; Future - methocarbamol (ROBAXIN) 500 MG tablet; Take 1 tablet (500 mg total) by mouth every 8 (eight) hours as needed for muscle spasms.  Dispense: 30 tablet; Refill: 0 - advised to notify provider or go to ED if symptoms worsen or fail to improve.   Family/ staff Communication: Reviewed plan of care with patient and wife verbalized understanding   Labs/tests ordered: - DG Lumbar Spine Complete; Future  Next Appointment: 09/30/2020 for medical management of chronic issues with PCP.    Sandrea Hughs, NP

## 2020-09-02 NOTE — Telephone Encounter (Signed)
Patient came into office stating that PCP Dewaine Oats Carlos American, NP told him to come into office to be seen by Orthopedic Dr and labs. Patient complains of having back pain. Patient isn't schedule for labs or office visit 09/02/2020. Patient states that he wanted to see Dr who could help with pain. Patient notified that Dr.Miller is the only Dr who could give injections. Patient has no documentation of PCP Dewaine Oats Carlos American, NP stating him coming into office. Patient added to schedule.

## 2020-09-02 NOTE — Patient Instructions (Signed)
- Please get lower back X-ray at [facility] at [place] then will call you with results.   Acute Back Pain, Adult Acute back pain is sudden and usually short-lived. It is often caused by an injury to the muscles and tissues in the back. The injury may result from: A muscle or ligament getting overstretched or torn (strained). Ligaments are tissues that connect bones to each other. Lifting something improperly can cause a back strain. Wear and tear (degeneration) of the spinal disks. Spinal disks are circular tissue that provide cushioning between the bones of the spine (vertebrae). Twisting motions, such as while playing sports or doing yard work. A hit to the back. Arthritis. You may have a physical exam, lab tests, and imaging tests to find the cause ofyour pain. Acute back pain usually goes away with rest and home care. Follow these instructions at home: Managing pain, stiffness, and swelling Treatment may include medicines for pain and inflammation that are taken by mouth or applied to the skin, prescription pain medicine, or muscle relaxants. Take over-the-counter and prescription medicines only as told by your health care provider. Your health care provider may recommend applying ice during the first 24-48 hours after your pain starts. To do this: Put ice in a plastic bag. Place a towel between your skin and the bag. Leave the ice on for 20 minutes, 2-3 times a day. If directed, apply heat to the affected area as often as told by your health care provider. Use the heat source that your health care provider recommends, such as a moist heat pack or a heating pad. Place a towel between your skin and the heat source. Leave the heat on for 20-30 minutes. Remove the heat if your skin turns bright red. This is especially important if you are unable to feel pain, heat, or cold. You have a greater risk of getting burned. Activity  Do not stay in bed. Staying in bed for  more than 1-2 days can delay your recovery. Sit up and stand up straight. Avoid leaning forward when you sit or hunching over when you stand. If you work at a desk, sit close to it so you do not need to lean over. Keep your chin tucked in. Keep your neck drawn back, and keep your elbows bent at a 90-degree angle (right angle). Sit high and close to the steering wheel when you drive. Add lower back (lumbar) support to your car seat, if needed. Take short walks on even surfaces as soon as you are able. Try to increase the length of time you walk each day. Do not sit, drive, or stand in one place for more than 30 minutes at a time. Sitting or standing for long periods of time can put stress on your back. Do not drive or use heavy machinery while taking prescription pain medicine. Use proper lifting techniques. When you bend and lift, use positions that put less stress on your back: [place] your knees. Keep the load close to your body. Avoid twisting. Exercise regularly as told by your health care provider. Exercising helps your back heal faster and helps prevent back injuries by keeping muscles strong and flexible. Work with a physical therapist to make a safe exercise program, as recommended by your health care provider. Do any exercises as told by your physical therapist.  Lifestyle Maintain a healthy weight. Extra weight puts stress on your back and makes it difficult to have good posture. Avoid activities or situations  that make you feel anxious or stressed. Stress and anxiety increase muscle tension and can make back pain worse. Learn ways to manage anxiety and stress, such as through exercise. General instructions Sleep on a firm mattress in a comfortable position. Try lying on your side with your knees slightly bent. If you lie on your back, put a pillow under your knees. Follow your treatment plan as told by your health care provider. This may include: Cognitive or behavioral  therapy. Acupuncture or massage therapy. Meditation or yoga. Contact a health care provider if: You have pain that is not relieved with rest or medicine. You have increasing pain going down into your legs or buttocks. Your pain does not improve after 2 weeks. You have pain at night. You lose weight without trying. You have a fever or chills. Get help right away if: You develop new bowel or bladder control problems. You have unusual weakness or numbness in your arms or legs. You develop nausea or vomiting. You develop abdominal pain. You feel faint. Summary Acute back pain is sudden and usually short-lived. Use proper lifting techniques. When you bend and lift, use positions that put less stress on your back. Take over-the-counter and prescription medicines and apply heat or ice as directed by your health care provider. This information is not intended to replace advice given to you by your health care provider. Make sure you discuss any questions you have with your healthcare provider. Document Revised: 10/24/2019 Document Reviewed: 10/27/2019 Elsevier Patient Education  2022 [employer].

## 2020-09-06 ENCOUNTER — Other Ambulatory Visit (HOSPITAL_COMMUNITY): Payer: Self-pay

## 2020-09-09 NOTE — Telephone Encounter (Signed)
Patient seen and treated by [name], NP

## 2020-09-10 ENCOUNTER — Other Ambulatory Visit (HOSPITAL_COMMUNITY): Payer: Self-pay

## 2020-09-13 ENCOUNTER — Telehealth: Payer: Self-pay

## 2020-09-13 DIAGNOSIS — E1122 Type 2 diabetes mellitus with diabetic chronic kidney disease: Secondary | ICD-10-CM

## 2020-09-13 NOTE — Telephone Encounter (Signed)
Patients spouse walked in with a form to be completed by [name], NP. Patient has already completed his portion of the patient assistance form. Once Janett Billow completes her portion form needs to be  1.) Fax to number at the top of form [phone]  2.) Mail original to patients address on file, confirmed  3.) Send copy to scanning   Form, patients demographics, and medication list placed in Jessica's review and sign folder

## 2020-09-16 NOTE — Telephone Encounter (Signed)
Application Faxed, Mailed and sent for scanning.

## 2020-09-16 NOTE — Telephone Encounter (Signed)
Signed and given to anita.

## 2020-09-18 MED ORDER — EMPAGLIFLOZIN 10 MG PO TABS: 10.0000 mg | ORAL_TABLET | Freq: Every day | ORAL | 3 refills | Status: DC

## 2020-09-18 NOTE — Telephone Encounter (Signed)
Received fax from [facility] stating that they received the application for assistance but needs the Prescription.   Printed Prescription and will have Janett Billow sign and fax back.

## 2020-09-18 NOTE — Addendum Note (Signed)
Addended by: Rafael Bihari A on: 09/18/2020 11:45 AM   Modules accepted: Orders

## 2020-09-19 ENCOUNTER — Other Ambulatory Visit: Payer: Self-pay

## 2020-09-19 ENCOUNTER — Other Ambulatory Visit (HOSPITAL_COMMUNITY): Payer: Self-pay

## 2020-09-19 ENCOUNTER — Other Ambulatory Visit: Payer: Self-pay | Admitting: Nurse Practitioner

## 2020-09-19 DIAGNOSIS — I5042 Chronic combined systolic (congestive) and diastolic (congestive) heart failure: Secondary | ICD-10-CM

## 2020-09-19 DIAGNOSIS — I10 Essential (primary) hypertension: Secondary | ICD-10-CM

## 2020-09-19 DIAGNOSIS — E1122 Type 2 diabetes mellitus with diabetic chronic kidney disease: Secondary | ICD-10-CM

## 2020-09-19 MED FILL — Furosemide Tab 40 MG: ORAL | 30 days supply | Qty: 30 | Fill #1 | Status: AC

## 2020-09-19 MED FILL — Metformin HCl Tab 500 MG: ORAL | 90 days supply | Qty: 180 | Fill #1 | Status: AC

## 2020-09-19 MED FILL — Hydralazine HCl Tab 25 MG: ORAL | 30 days supply | Qty: 270 | Fill #1 | Status: AC

## 2020-09-20 ENCOUNTER — Other Ambulatory Visit: Payer: Self-pay

## 2020-09-20 ENCOUNTER — Other Ambulatory Visit (HOSPITAL_COMMUNITY): Payer: Self-pay

## 2020-09-23 ENCOUNTER — Other Ambulatory Visit: Payer: Self-pay | Admitting: Family

## 2020-09-23 ENCOUNTER — Other Ambulatory Visit (HOSPITAL_COMMUNITY): Payer: Self-pay

## 2020-09-23 DIAGNOSIS — M545 Low back pain, unspecified: Secondary | ICD-10-CM

## 2020-09-23 NOTE — Telephone Encounter (Signed)
Received fax from [facility] and was APPROVED 09/23/20-02/15/21  Can be reordered through: [employer] at [phone]

## 2020-09-24 ENCOUNTER — Other Ambulatory Visit (HOSPITAL_COMMUNITY): Payer: Self-pay

## 2020-09-24 ENCOUNTER — Other Ambulatory Visit: Payer: Self-pay

## 2020-09-25 ENCOUNTER — Other Ambulatory Visit: Payer: Self-pay

## 2020-09-25 ENCOUNTER — Other Ambulatory Visit (HOSPITAL_COMMUNITY): Payer: Self-pay

## 2020-09-26 ENCOUNTER — Other Ambulatory Visit (HOSPITAL_COMMUNITY): Payer: Self-pay

## 2020-09-30 ENCOUNTER — Ambulatory Visit: Payer: Medicare HMO | Admitting: Nurse Practitioner

## 2020-10-04 ENCOUNTER — Other Ambulatory Visit: Payer: Self-pay | Admitting: Nurse Practitioner

## 2020-10-04 ENCOUNTER — Ambulatory Visit (INDEPENDENT_AMBULATORY_CARE_PROVIDER_SITE_OTHER): Payer: Medicare HMO | Admitting: Nurse Practitioner

## 2020-10-04 ENCOUNTER — Encounter: Payer: Self-pay | Admitting: Nurse Practitioner

## 2020-10-04 ENCOUNTER — Other Ambulatory Visit: Payer: Self-pay

## 2020-10-04 ENCOUNTER — Other Ambulatory Visit (HOSPITAL_COMMUNITY): Payer: Self-pay

## 2020-10-04 VITALS — BP 140/78 | HR 67 | Temp 97.1°F

## 2020-10-04 DIAGNOSIS — E1122 Type 2 diabetes mellitus with diabetic chronic kidney disease: Secondary | ICD-10-CM

## 2020-10-04 DIAGNOSIS — M545 Low back pain, unspecified: Secondary | ICD-10-CM

## 2020-10-04 DIAGNOSIS — R339 Retention of urine, unspecified: Secondary | ICD-10-CM

## 2020-10-04 DIAGNOSIS — I10 Essential (primary) hypertension: Secondary | ICD-10-CM

## 2020-10-04 DIAGNOSIS — M170 Bilateral primary osteoarthritis of knee: Secondary | ICD-10-CM

## 2020-10-04 DIAGNOSIS — Z86711 Personal history of pulmonary embolism: Secondary | ICD-10-CM

## 2020-10-04 NOTE — Patient Instructions (Signed)
Call and make follow up with cardiologist.

## 2020-10-05 LAB — CBC WITH DIFFERENTIAL/PLATELET
Absolute Monocytes: 369 cells/uL (ref 200–950)
Basophils Absolute: 41 cells/uL (ref 0–200)
Basophils Relative: 0.5 %
Eosinophils Absolute: 90 cells/uL (ref 15–500)
Eosinophils Relative: 1.1 %
HCT: 45.9 % (ref 38.5–50.0)
Hemoglobin: 14.7 g/dL (ref 13.2–17.1)
Lymphs Abs: 902 cells/uL (ref 850–3900)
MCH: 26.4 pg — ABNORMAL LOW (ref 27.0–33.0)
MCHC: 32 g/dL (ref 32.0–36.0)
MCV: 82.4 fL (ref 80.0–100.0)
MPV: 10.6 fL (ref 7.5–12.5)
Monocytes Relative: 4.5 %
Neutro Abs: 6798 cells/uL (ref 1500–7800)
Neutrophils Relative %: 82.9 %
Platelets: 331 10*3/uL (ref 140–400)
RBC: 5.57 10*6/uL (ref 4.20–5.80)
RDW: 15.6 % — ABNORMAL HIGH (ref 11.0–15.0)
Total Lymphocyte: 11 %
WBC: 8.2 10*3/uL (ref 3.8–10.8)

## 2020-10-05 LAB — COMPLETE METABOLIC PANEL WITH GFR
AG Ratio: 1.1 (calc) (ref 1.0–2.5)
ALT: 14 U/L (ref 9–46)
AST: 13 U/L (ref 10–35)
Albumin: 3.9 g/dL (ref 3.6–5.1)
Alkaline phosphatase (APISO): 73 U/L (ref 35–144)
BUN: 8 mg/dL (ref 7–25)
CO2: 29 mmol/L (ref 20–32)
Calcium: 9.1 mg/dL (ref 8.6–10.3)
Chloride: 103 mmol/L (ref 98–110)
Creat: 1 mg/dL (ref 0.70–1.28)
Globulin: 3.5 g/dL (calc) (ref 1.9–3.7)
Glucose, Bld: 205 mg/dL — ABNORMAL HIGH (ref 65–99)
Potassium: 3.5 mmol/L (ref 3.5–5.3)
Sodium: 143 mmol/L (ref 135–146)
Total Bilirubin: 0.9 mg/dL (ref 0.2–1.2)
Total Protein: 7.4 g/dL (ref 6.1–8.1)
eGFR: 81 mL/min/{1.73_m2} (ref >=60)

## 2020-10-05 LAB — PSA: PSA: 0.75 ng/mL (ref <=4.00)

## 2020-10-05 LAB — HEMOGLOBIN A1C
Hgb A1c MFr Bld: 7.4 % of total Hgb — ABNORMAL HIGH (ref <5.7)
Mean Plasma Glucose: 166 mg/dL
eAG (mmol/L): 9.2 mmol/L

## 2020-10-07 ENCOUNTER — Telehealth: Payer: Self-pay | Admitting: *Deleted

## 2020-10-07 ENCOUNTER — Other Ambulatory Visit (HOSPITAL_COMMUNITY): Payer: Self-pay

## 2020-10-07 ENCOUNTER — Other Ambulatory Visit: Payer: Self-pay

## 2020-10-07 ENCOUNTER — Telehealth: Payer: Self-pay

## 2020-10-07 DIAGNOSIS — M170 Bilateral primary osteoarthritis of knee: Secondary | ICD-10-CM

## 2020-10-07 DIAGNOSIS — E1122 Type 2 diabetes mellitus with diabetic chronic kidney disease: Secondary | ICD-10-CM

## 2020-10-07 DIAGNOSIS — M545 Low back pain, unspecified: Secondary | ICD-10-CM

## 2020-10-07 DIAGNOSIS — I509 Heart failure, unspecified: Secondary | ICD-10-CM

## 2020-10-07 MED ORDER — TRAMADOL HCL 50 MG PO TABS
50.0000 mg | ORAL_TABLET | Freq: Four times a day (QID) | ORAL | 0 refills | Status: DC | PRN
  Filled 2020-10-07: qty 30, 8d supply, fill #0

## 2020-10-07 MED ORDER — METHOCARBAMOL 500 MG PO TABS
500.0000 mg | ORAL_TABLET | Freq: Three times a day (TID) | ORAL | 0 refills | Status: DC | PRN
  Filled 2020-10-07: qty 30, 10d supply, fill #0

## 2020-10-07 MED ORDER — CARVEDILOL 12.5 MG PO TABS
12.5000 mg | ORAL_TABLET | Freq: Two times a day (BID) | ORAL | 1 refills | Status: DC
Start: 2020-10-07 — End: 2021-04-08
  Filled 2020-10-07: qty 180, 90d supply, fill #0
  Filled 2021-02-07: qty 180, 90d supply, fill #1

## 2020-10-07 NOTE — Progress Notes (Unsigned)
This encounter was created in error - please disregard.

## 2020-10-07 NOTE — Progress Notes (Signed)
This encounter was created in error - please disregard.

## 2020-10-07 NOTE — Telephone Encounter (Signed)
Patient needs test strips. Patient was not at home to tell em the name of test strips, but will call to let me know so they can be sent to pharmacy.

## 2020-10-07 NOTE — Telephone Encounter (Signed)
Patient called and stated that he was seen Friday for an appointment and requested refills on his: Methocarbamol Tramadol Carvedilol  But [facility] pharmacy never received. Stated that he was told they would be sent to pharmacy.  Pended Rx's for approval.

## 2020-10-08 ENCOUNTER — Other Ambulatory Visit (HOSPITAL_COMMUNITY): Payer: Self-pay

## 2020-10-08 ENCOUNTER — Other Ambulatory Visit: Payer: Self-pay | Admitting: Nurse Practitioner

## 2020-10-08 DIAGNOSIS — E1122 Type 2 diabetes mellitus with diabetic chronic kidney disease: Secondary | ICD-10-CM

## 2020-10-08 MED ORDER — TRULICITY 0.75 MG/0.5ML ~~LOC~~ SOAJ
0.7500 mg | SUBCUTANEOUS | 3 refills | Status: DC
  Filled 2020-10-08: qty 2, 28d supply, fill #0

## 2020-10-08 MED ORDER — TRULICITY 0.75 MG/0.5ML ~~LOC~~ SOAJ
0.7500 mg | SUBCUTANEOUS | 3 refills | Status: DC
Start: 2020-10-08 — End: 2020-10-08
  Filled 2020-10-08: qty 0.5, 7d supply, fill #0

## 2020-10-08 NOTE — Progress Notes (Signed)
Careteam: Patient Care Team: Sharon Seller, NP as PCP - General (Geriatric Medicine) Corky Crafts, MD as PCP - Cardiology (Cardiology) Dione Booze, Bertram Millard, MD as Consulting Physician (Ophthalmology)  PLACE OF SERVICE:  [facility]  Advanced Directive information Does Patient Have a Medical Advance Directive?: No, Would patient like information on creating a medical advance directive?: Yes (MAU/Ambulatory/Procedural Areas - Information given) (Have from a previous visit)  Allergies  Allergen Reactions   Lisinopril Cough    Chief Complaint  Patient presents with   Medical Management of Chronic Issues    Routine follow-up visit. Discuss need for shingrix, td/tdap, covid, flu (not in stock), and a1c. Refused foot exam today. Discuss getting PSA check. Patient would like to discuss back related concerns. Refill robaxin,carvedilol, and DM supples. Here with spouse.      HPI: Patient is a 70 y.o. male for routine follow up.   Was seen acutely last month by Dinah NP, due to severe back pain after injury. Xray noted diffuse degenerative changes with out acute abnormality. He was given robaxin to use PRN with tramadol and tylenol.  Reports pain was doing better until he reached out to grab a bag of groceries. Then pain started back.      Ongoing urinary retention with monthly change on indwelling SP catheter by urology   Htn- sbp at home generally in 130s.    Osa- has CPAP but it needs to be replaced.    DM- reports blood sugars are doing well.    Hyperlipidemia- continues on lipitor.    His brother is in the hospital. That has been stressful.  He was doing well but "feel off the wagon"    OA knees are not doing well. Had orthopedic but reports he would like another.  (Previously saw Dr Roda Shutters)   Review of Systems:  Review of Systems  Constitutional:  Negative for chills, fever and weight loss.  HENT:  Negative for tinnitus.   Respiratory:  Negative for cough,  sputum production and shortness of breath.   Cardiovascular:  Negative for chest pain, palpitations and leg swelling.  Gastrointestinal:  Negative for abdominal pain, constipation, diarrhea and heartburn.  Genitourinary:        Chronic catheter.   Musculoskeletal:  Positive for back pain, joint pain and myalgias. Negative for falls.  Skin: Negative.   Neurological:  Negative for dizziness and headaches.  Psychiatric/Behavioral:  Negative for depression and memory loss. The patient does not have insomnia.    Past Medical History:  Diagnosis Date   Acute on chronic diastolic CHF (congestive heart failure) (HCC) 06/01/2017   Arthritis    Cancer (HCC) 2010   Prostate   Chronic kidney disease    Diabetes mellitus    Diabetic neuropathy (HCC)    feet   DVT (deep venous thrombosis) (HCC)    Genetic testing 06/22/2016   Mr. Uher underwent genetic counseling and testing for hereditary cancer syndromes on 05/14/2016. His results were negative for mutations in all 46 genes analyzed by [facility]'s 46-gene Common Hereditary Cancers Panel. Genes analyzed include: APC, ATM, AXIN2, BARD1, BMPR1A, BRCA1, BRCA2, BRIP1, CDH1, CDKN2A, CHEK2, CTNNA1, DICER1, EPCAM, GREM1, HOXB13, KIT, MEN1, MLH1, MSH2, MSH3, MSH6, MUTYH, NB   GERD (gastroesophageal reflux disease)    Hypertension    PE (pulmonary thromboembolism) (HCC)    Pneumonia    Sleep apnea    not wearing CPAP   Suprapubic catheter (HCC) 11/17/2019   Past Surgical History:  Procedure Laterality  Date   CARDIOVERSION N/A 06/03/2017   Procedure: CARDIOVERSION;  Surgeon: Jerline Pain, MD;  Location: [facility] ENDOSCOPY;  Service: Cardiovascular;  Laterality: N/A;   COLONOSCOPY     COLONOSCOPY WITH PROPOFOL N/A 10/20/2018   Procedure: COLONOSCOPY WITH PROPOFOL;  Surgeon: Thornton Park, MD;  Location: [facility] ENDOSCOPY;  Service: Gastroenterology;  Laterality: N/A;   HERNIA REPAIR     KNEE SURGERY     MULTIPLE TOOTH EXTRACTIONS     POLYPECTOMY  10/20/2018    Procedure: POLYPECTOMY;  Surgeon: Thornton Park, MD;  Location: [facility] ENDOSCOPY;  Service: Gastroenterology;;   PROSTATE SURGERY     RADIOACTIVE SEED IMPLANT     RIGHT/LEFT HEART CATH AND CORONARY ANGIOGRAPHY N/A 07/06/2017   Procedure: RIGHT/LEFT HEART CATH AND CORONARY ANGIOGRAPHY;  Surgeon: Jettie Booze, MD;  Location: [place] [facility];  Service: Cardiovascular;  Laterality: N/A;   SHOULDER SURGERY     TOTAL KNEE ARTHROPLASTY Right 11/19/2016   Procedure: RIGHT TOTAL KNEE ARTHROPLASTY;  Surgeon: Leandrew Koyanagi, MD;  Location: [facility];  Service: Orthopedics;  Laterality: Right;   uretha surgery-2014     Social History:   reports that he has never smoked. He has never used smokeless tobacco. He reports that he does not drink alcohol and does not use drugs.  Family History  Problem Relation Age of Onset   Breast cancer Mother 27       d.66   Breast cancer Sister 30       d.30   Leukemia Brother 18       d.20   Breast cancer Maternal Aunt 40       d.51s   Lung cancer Maternal Uncle    Prostate cancer Paternal Uncle    Prostate cancer Brother        recurred recently at age 51   Other Brother 8       spinal tumor   Cervical cancer Other 81       d.22   Cancer Sister 19       unspecified type   Cancer Maternal Uncle 67       unspecified type    Medications: Patient's Medications  New Prescriptions   No medications on file  Previous Medications   ACCU-CHEK SOFTCLIX LANCETS LANCETS    Use to test blood sugar daily. Dx: E11.22   ALCOHOL SWABS (B-D SINGLE USE SWABS REGULAR) PADS    Use in testing blood sugar daily. Dx: E11.22   ASPIRIN EC 81 MG TABLET    Take 81 mg by mouth daily.   ATORVASTATIN (LIPITOR) 40 MG TABLET    Take 1 tablet (40 mg total) by mouth daily.   BLOOD GLUCOSE CALIBRATION (ACCU-CHEK AVIVA) SOLN    Use once daily as directed dx E11.22   EMPAGLIFLOZIN (JARDIANCE) 10 MG TABS TABLET    Take 1 tablet (10 mg total) by mouth daily.   FUROSEMIDE (LASIX) 40  MG TABLET    TAKE 1 TABLET (40 MG TOTAL) BY MOUTH DAILY.   GLUCOSE BLOOD (ACCU-CHEK AVIVA PLUS) TEST STRIP    1 each by Other route as needed for other. Use to test blood sugar daily. Dx: E11.22   HYDRALAZINE (APRESOLINE) 25 MG TABLET    TAKE 3 TABLETS (75 MG TOTAL) BY MOUTH 3 (THREE) TIMES DAILY. NEEDS APPT FOR ADDITIONAL REFILLS   LOSARTAN (COZAAR) 50 MG TABLET    Take 1 tablet (50 mg total) by mouth daily.   METFORMIN (GLUCOPHAGE) 500 MG TABLET  TAKE 1 TABLET (500 MG TOTAL) BY MOUTH 2 TIMES DAILY WITH A MEAL.   MULTIPLE VITAMINS-MINERALS (EMERGEN-C VITAMIN C PO)    Take 1 tablet by mouth daily.   POTASSIUM CHLORIDE SA (KLOR-CON) 20 MEQ TABLET    Take 1 tablet (20 mEq total) by mouth 2 (two) times daily.   RIVAROXABAN (XARELTO) 20 MG TABS TABLET    Take 1 tablet (20 mg total) by mouth daily with supper.   SENNA 8.7 MG CHEW    Chew by mouth as needed.   VITAMIN D PO    Take 2 tablets by mouth daily. Gummies  Modified Medications   Modified Medication Previous Medication   AMLODIPINE (NORVASC) 10 MG TABLET amLODipine (NORVASC) 10 MG tablet      TAKE 1 TABLET EVERY DAY FOR HIGH BLOOD PRESSURE    TAKE 1 TABLET EVERY DAY FOR HIGH BLOOD PRESSURE   CARVEDILOL (COREG) 12.5 MG TABLET carvedilol (COREG) 12.5 MG tablet      Take 1 tablet (12.5 mg total) by mouth 2 (two) times daily with a meal.    Take one tablet by mouth twice daily with a meal   GLIPIZIDE (GLUCOTROL) 5 MG TABLET glipiZIDE (GLUCOTROL) 5 MG tablet      TAKE 1 TABLET EVERY DAY BEFORE BREAKFAST    TAKE 1 TABLET  BY MOUTH DAILY BEFORE BREAKFAST.   METHOCARBAMOL (ROBAXIN) 500 MG TABLET methocarbamol (ROBAXIN) 500 MG tablet      Take 1 tablet (500 mg total) by mouth every 8 (eight) hours as needed for muscle spasms.    Take 1 tablet (500 mg total) by mouth every 8 (eight) hours as needed for muscle spasms.   TRAMADOL (ULTRAM) 50 MG TABLET traMADol (ULTRAM) 50 MG tablet      Take 1 tablet (50 mg total) by mouth every 6 (six) hours as needed  for pain    Take 1 tablet (50 mg total) by mouth every 6 (six) hours as needed for pain  Discontinued Medications   CARVEDILOL (COREG) 6.25 MG TABLET    TAKE 1 TABLET BY MOUTH ONCE DAILY IN THE MORNING AND 1 TABLET AT BEDTIME.   CIPROFLOXACIN (CIPRO) 500 MG TABLET    TAKE 1 TABLET (500 MG TOTAL) BY MOUTH 2 TIMES DAILY FOR 5 DAYS.   ELDERBERRY PO    Take 1 tablet by mouth daily.   PANTOPRAZOLE (PROTONIX) 40 MG TABLET    Take 1 tablet (40 mg total) by mouth daily.   SULFAMETHOXAZOLE-TRIMETHOPRIM (BACTRIM DS) 800-160 MG TABLET    TAKE 1 TABLET BY MOUTH 2 TIMES DAILY FOR 10 DAYS.    Physical Exam:  Vitals:   10/04/20 0915  BP: 140/78  Pulse: 67  Temp: (!) 97.1 F (36.2 C)  TempSrc: Temporal  SpO2: 96%   There is no height or weight on file to calculate BMI. Wt Readings from Last 3 Encounters:  03/15/20 (!) 320 lb 3.2 oz (145.2 kg)  09/15/19 (!) 328 lb 9.6 oz (149.1 kg)  09/06/19 (!) 320 lb 3.2 oz (145.2 kg)    Physical Exam Constitutional:      General: He is not in acute distress.    Appearance: He is well-developed. He is obese. He is not diaphoretic.  HENT:     Head: Normocephalic and atraumatic.     Right Ear: External ear normal.     Left Ear: External ear normal.     Mouth/Throat:     Pharynx: No oropharyngeal exudate.  Eyes:  Conjunctiva/sclera: Conjunctivae normal.     Pupils: Pupils are equal, round, and reactive to light.  Cardiovascular:     Rate and Rhythm: Normal rate and regular rhythm.     Heart sounds: Normal heart sounds.  Pulmonary:     Effort: Pulmonary effort is normal.     Breath sounds: Normal breath sounds.  Abdominal:     General: Bowel sounds are normal.     Palpations: Abdomen is soft.  Musculoskeletal:        General: Tenderness present.     Cervical back: Normal range of motion and neck supple.     Right lower leg: No edema.     Left lower leg: No edema.  Skin:    General: Skin is warm and dry.  Neurological:     Mental Status: He is  alert and oriented to person, place, and time.     Motor: Weakness present.     Gait: Gait abnormal (in wheelchair today).    Labs reviewed: Basic Metabolic Panel: Recent Labs    11/06/19 0852 03/15/20 1216 10/04/20 1013  NA 143 142 143  K 3.5 4.3 3.5  CL 101 107 103  CO2 $Re'28 22 29  'kQV$ GLUCOSE 164* 102 205*  BUN $Re'12 10 8  'Qra$ CREATININE 0.95 1.12 1.00  CALCIUM 8.6 9.3 9.1  TSH 0.95  --   --    Liver Function Tests: Recent Labs    11/06/19 0852 03/15/20 1216 10/04/20 1013  AST $Re'11 14 13  'VAJ$ ALT $R'10 15 14  'Qj$ BILITOT 1.0 0.6 0.9  PROT 7.4 7.4 7.4   No results for input(s): LIPASE, AMYLASE in the last 8760 hours. No results for input(s): AMMONIA in the last 8760 hours. CBC: Recent Labs    11/06/19 0852 03/15/20 1216 10/04/20 1013  WBC 9.0 7.4 8.2  NEUTROABS  --  5,284 6,798  HGB 12.6* 14.3 14.7  HCT 39.3 43.9 45.9  MCV 83.3 84.3 82.4  PLT 393 321 331   Lipid Panel: Recent Labs    11/06/19 0852  CHOL 118  HDL 37*  LDLCALC 65  TRIG 80  CHOLHDL 3.2   TSH: Recent Labs    11/06/19 0852  TSH 0.95   A1C: Lab Results  Component Value Date   HGBA1C 7.4 (H) 10/04/2020     Assessment/Plan 1. Acute bilateral low back pain without sciatica -ongoing, better than worse. Refill provided for robaxin. Continue to use heat PRN. PT for further evaluation and treatment. - Ambulatory referral to Physical Therapy - AMB referral to orthopedics  2. Urinary retention -followed by urology, request PSA be drawn today. - PSA  3. MORBID OBESITY -education provided on healthy weight loss through increase in physical activity and proper nutrition  - CMP with eGFR([facility])  4. Essential hypertension --Blood pressure elevated today, but typically well controlled -Patient reports bp typically elevated in office and home blood pressures are well controlled -No changes to medications today  -will have pt continue to monitor home bp goal <201/00 -follow metabolic panel - CMP with  eGFR([facility])  5. Type 2 diabetes mellitus with chronic kidney disease, without long-term current use of insulin, unspecified CKD stage ([place]) -Encouraged dietary compliance, routine foot care/monitoring and to keep up with diabetic eye exams through ophthalmology  -will consider adding trulicity to help with weight loss and a1c control if not controlled.  - Hemoglobin A1c - CMP with eGFR([facility])  6. History of pulmonary embolism -continues on xarelto, lifetime. - CBC with Differential/Platelet  7. Primary  osteoarthritis of both knees -ongoing, pain with decreased mobility. - Ambulatory referral to Physical Therapy - AMB referral to orthopedics   Next appt: 4 months.  Carlos American. [place], [facility] [phone]

## 2020-10-15 ENCOUNTER — Ambulatory Visit: Payer: Medicare HMO | Admitting: Orthopaedic Surgery

## 2020-10-16 ENCOUNTER — Other Ambulatory Visit (HOSPITAL_COMMUNITY): Payer: Self-pay

## 2020-10-22 ENCOUNTER — Ambulatory Visit: Payer: Medicare HMO

## 2020-10-22 ENCOUNTER — Ambulatory Visit: Payer: Medicare HMO | Admitting: Orthopaedic Surgery

## 2020-10-24 ENCOUNTER — Other Ambulatory Visit: Payer: Self-pay

## 2020-10-24 ENCOUNTER — Telehealth: Payer: Self-pay

## 2020-10-24 ENCOUNTER — Encounter: Payer: Self-pay | Admitting: Nurse Practitioner

## 2020-10-24 ENCOUNTER — Ambulatory Visit (INDEPENDENT_AMBULATORY_CARE_PROVIDER_SITE_OTHER): Payer: Medicare HMO | Admitting: Nurse Practitioner

## 2020-10-24 DIAGNOSIS — M1991 Primary osteoarthritis, unspecified site: Secondary | ICD-10-CM | POA: Diagnosis not present

## 2020-10-24 DIAGNOSIS — Z0000 Encounter for general adult medical examination without abnormal findings: Secondary | ICD-10-CM | POA: Diagnosis not present

## 2020-10-24 NOTE — Progress Notes (Signed)
Subjective:   Thomas [name] is a 70 y.o. male who presents for Medicare Annual/Subsequent preventive examination.  Review of Systems     Cardiac Risk Factors include: obesity (BMI >30kg/m2);advanced age (>109mn, >>80women);hypertension;dyslipidemia;diabetes mellitus;sedentary lifestyle     Objective:    There were no vitals filed for this visit. There is no height or weight on file to calculate BMI.  Advanced Directives 10/24/2020 10/04/2020 09/02/2020 10/20/2018 08/18/2018 04/14/2018 03/14/2018  Does Patient Have a Medical Advance Directive? Yes _0  No  Type of [place] - - - - - -  Does patient want to make changes to medical advance directive? No - Patient declined - - - - - -  Copy of [place] Chart? No - copy requested - - - - - -  Would patient like information on creating a medical advance directive? - Yes (MAU/Ambulatory/Procedural Areas - Information given) No - Patient declined - Yes (MAU/Ambulatory/Procedural Areas - Information given) No - Patient declined No - Patient declined    Current Medications (verified) Outpatient Encounter Medications as of 10/24/2020  Medication Sig   Accu-Chek Softclix Lancets lancets Use to test blood sugar daily. Dx: E11.22   Alcohol Swabs (B-D SINGLE USE SWABS REGULAR) PADS Use in testing blood sugar daily. Dx: E11.22   amLODipine (NORVASC) 10 MG tablet TAKE 1 TABLET EVERY DAY FOR HIGH BLOOD PRESSURE   aspirin EC 81 MG tablet Take 81 mg by mouth daily.   atorvastatin (LIPITOR) 40 MG tablet Take 1 tablet (40 mg total) by mouth daily.   Blood Glucose Calibration (ACCU-CHEK AVIVA) SOLN Use once daily as directed dx E11.22   carvedilol (COREG) 12.5 MG tablet Take 1 tablet (12.5 mg total) by mouth 2 (two) times daily with a meal.   Dulaglutide (TRULICITY) 08.81MJS/3.1RXSOPN Inject 0.75 mg into the skin once a week.   empagliflozin (JARDIANCE) 10 MG TABS tablet Take  1 tablet (10 mg total) by mouth daily.   furosemide (LASIX) 40 MG tablet TAKE 1 TABLET (40 MG TOTAL) BY MOUTH DAILY.   glipiZIDE (GLUCOTROL) 5 MG tablet TAKE 1 TABLET EVERY DAY BEFORE BREAKFAST   glucose blood (ACCU-CHEK AVIVA PLUS) test strip 1 each by Other route as needed for other. Use to test blood sugar daily. Dx: E11.22   hydrALAZINE (APRESOLINE) 25 MG tablet TAKE 3 TABLETS (75 MG TOTAL) BY MOUTH 3 (THREE) TIMES DAILY. NEEDS APPT FOR ADDITIONAL REFILLS   losartan (COZAAR) 50 MG tablet Take 1 tablet (50 mg total) by mouth daily.   metFORMIN (GLUCOPHAGE) 500 MG tablet TAKE 1 TABLET (500 MG TOTAL) BY MOUTH 2 TIMES DAILY WITH A MEAL.   methocarbamol (ROBAXIN) 500 MG tablet Take 1 tablet (500 mg total) by mouth every 8 (eight) hours as needed for muscle spasms.   Multiple Vitamins-Minerals (EMERGEN-C VITAMIN C PO) Take 1 tablet by mouth daily.   potassium chloride SA (KLOR-CON) 20 MEQ tablet Take 1 tablet (20 mEq total) by mouth 2 (two) times daily.   rivaroxaban (XARELTO) 20 MG TABS tablet Take 1 tablet (20 mg total) by mouth daily with supper.   Senna 8.7 MG CHEW Chew by mouth as needed.   traMADol (ULTRAM) 50 MG tablet Take 1 tablet (50 mg total) by mouth every 6 (six) hours as needed for pain   VITAMIN D PO Take 2 tablets by mouth daily. Gummies   No facility-administered encounter medications on file as of 10/24/2020.  Allergies (verified) Lisinopril   History: Past Medical History:  Diagnosis Date   Acute on chronic diastolic CHF (congestive heart failure) ([place]) 06/01/2017   Arthritis    Cancer ([place]) 2010   Prostate   Chronic kidney disease    Diabetes mellitus    Diabetic neuropathy ([place])    feet   DVT (deep venous thrombosis) ([place])    Genetic testing 06/22/2016   Thomas [name] underwent genetic counseling and testing for hereditary cancer syndromes on 05/14/2016. His results were negative for mutations in all 46 genes analyzed by [facility]'s 46-gene Common Hereditary Cancers  Panel. Genes analyzed include: APC, ATM, AXIN2, BARD1, BMPR1A, BRCA1, BRCA2, BRIP1, CDH1, CDKN2A, CHEK2, CTNNA1, DICER1, EPCAM, GREM1, HOXB13, KIT, MEN1, MLH1, MSH2, MSH3, MSH6, MUTYH, NB   GERD (gastroesophageal reflux disease)    Hypertension    PE (pulmonary thromboembolism) ([facility])    Pneumonia    Sleep apnea    not wearing CPAP   Suprapubic catheter ([place]) 11/17/2019   Past Surgical History:  Procedure Laterality Date   CARDIOVERSION N/A 06/03/2017   Procedure: CARDIOVERSION;  Surgeon: Jerline Pain, MD;  Location: [facility];  Service: Cardiovascular;  Laterality: N/A;   COLONOSCOPY     COLONOSCOPY WITH PROPOFOL N/A 10/20/2018   Procedure: COLONOSCOPY WITH PROPOFOL;  Surgeon: Thornton Park, MD;  Location: [facility];  Service: Gastroenterology;  Laterality: N/A;   HERNIA REPAIR     KNEE SURGERY     MULTIPLE TOOTH EXTRACTIONS     POLYPECTOMY  10/20/2018   Procedure: POLYPECTOMY;  Surgeon: Thornton Park, MD;  Location: [facility];  Service: Gastroenterology;;   PROSTATE SURGERY     RADIOACTIVE SEED IMPLANT     RIGHT/LEFT HEART CATH AND CORONARY ANGIOGRAPHY N/A 07/06/2017   Procedure: RIGHT/LEFT HEART CATH AND CORONARY ANGIOGRAPHY;  Surgeon: Jettie Booze, MD;  Location: [facility];  Service: Cardiovascular;  Laterality: N/A;   SHOULDER SURGERY     TOTAL KNEE ARTHROPLASTY Right 11/19/2016   Procedure: RIGHT TOTAL KNEE ARTHROPLASTY;  Surgeon: Leandrew Koyanagi, MD;  Location: [facility];  Service: Orthopedics;  Laterality: Right;   uretha surgery-2014     Family History  Problem Relation Age of Onset   Breast cancer Mother 71       d.62   Breast cancer Sister 30       d.30   Leukemia Brother 18       d.20   Breast cancer Maternal Aunt 40       d.9s   Lung cancer Maternal Uncle    Prostate cancer Paternal Uncle    Prostate cancer Brother        recurred recently at age 53   Other Brother 31       spinal tumor   Cervical cancer Other 70       d.22   Cancer  Sister 74       unspecified type   Cancer Maternal Uncle 80       unspecified type   Social History   Socioeconomic History   Marital status: Married    Spouse name: Not on file   Number of children: Not on file   Years of education: Not on file   Highest education level: Not on file  Occupational History   Not on file  Tobacco Use   Smoking status: Never   Smokeless tobacco: Never  Vaping Use   Vaping Use: Never used  Substance and Sexual Activity   Alcohol use: No  Comment: never   Drug use: No   Sexual activity: Yes  Other Topics Concern   Not on file  Social History Narrative   Not on file   Social Determinants of Health   Financial Resource Strain: Not on file  Food Insecurity: Not on file  Transportation Needs: Not on file  Physical Activity: Not on file  Stress: Not on file  Social Connections: Not on file    Tobacco Counseling Counseling given: Not Answered   Clinical Intake:  Pre-visit preparation completed: Yes  Pain : No/denies pain     BMI - recorded: 47 Diabetes: Yes  How often do you need to have someone help you when you read instructions, pamphlets, or other written materials from your doctor or pharmacy?: 3 - Sometimes (due to eyes)  Diabetic?no         Activities of Daily Living In your present state of health, do you have any difficulty performing the following activities: 10/24/2020  Hearing? N  Vision? Y  Difficulty concentrating or making decisions? Y  Walking or climbing stairs? Y  Dressing or bathing? N  Doing errands, shopping? Y  Preparing Food and eating ? N  Using the Toilet? N  In the past six months, have you accidently leaked urine? N  Do you have problems with loss of bowel control? N  Managing your Medications? Y  Comment wife helps  Managing your Finances? N  Housekeeping or managing your Housekeeping? Y  Comment wife helps  Some recent data might be hidden    Patient Care Team: [name],  NP as PCP - General (Geriatric Medicine) Jettie Booze, MD as PCP - Cardiology (Cardiology) Katy Fitch, Darlina Guys, MD as Consulting Physician (Ophthalmology)  Indicate any recent Medical Services you may have received from other than [facility] providers in the past year (date may be approximate).     Assessment:   This is a routine wellness examination for Thomas [name].  Hearing/Vision screen Hearing Screening - Comments:: Patient has sliht hearing problems. Vision Screening - Comments:: Patient wears glasses.Patient has had eye exam within past year. Patient sees Dr. Bing Plume  Dietary issues and exercise activities discussed: Current Exercise Habits: Home exercise routine, Type of exercise: calisthenics, Time (Minutes): 30, Frequency (Times/Week): 5, Weekly Exercise (Minutes/Week): 150   Goals Addressed   None    Depression Screen PHQ 2/9 Scores 10/24/2020 05/11/2019 08/18/2018 12/09/2017 12/09/2017 07/20/2017 05/21/2016  PHQ - 2 Score 0 0 0 0 0 0 1  PHQ- 9 Score - - - - - - -    Fall Risk Fall Risk  10/24/2020 10/04/2020 09/02/2020 09/15/2019 05/11/2019  Falls in the past year? 1 0 0 0 0  Number falls in past yr: 1 0 0 - 0  Injury with Fall? 0 0 0 - 0  Comment - - - - -  Risk for fall due to : No Fall Risks No Fall Risks No Fall Risks - -  Follow up Falls evaluation completed Falls evaluation completed Falls evaluation completed - -    FALL RISK PREVENTION PERTAINING TO THE HOME:  Any stairs in or around the home? Yes  If so, are there any without handrails? No  Home free of loose throw rugs in walkways, pet beds, electrical cords, etc? Yes  Adequate lighting in your home to reduce risk of falls? Yes   ASSISTIVE DEVICES UTILIZED TO PREVENT FALLS:  Life alert? No  Use of a cane, walker or w/c? Yes  Grab bars in  the bathroom? No  Shower chair or bench in shower? Yes  Elevated toilet seat or a handicapped toilet? Yes   TIMED UP AND GO:  Was the test performed? No .    Cognitive  Function: MMSE - Mini Mental State Exam 12/09/2017 05/21/2016  Orientation to time 4 5  Orientation to Place 5 5  Registration 3 3  Attention/ Calculation 5 4  Recall 2 2  Language- name 2 objects 2 2  Language- repeat 1 1  Language- follow 3 step command 3 3  Language- read & follow direction 1 1  Write a sentence 1 1  Copy design 1 1  Total score 28 28     6CIT Screen 10/24/2020  What Year? 0 points  What month? 0 points  What time? 0 points  Count back from 20 0 points  Months in reverse 0 points  Repeat phrase 10 points  Total Score 10    Immunizations Immunization History  Administered Date(s) Administered   Fluad Quad(high Dose 65+) 12/09/2018, 03/15/2020   Influenza, High Dose Seasonal PF 12/14/2016   Influenza,inj,Quad PF,6+ Mos 11/22/2013, 12/05/2014, 10/24/2015   Influenza-Unspecified 11/29/2017   PFIZER(Purple Top)SARS-COV-2 Vaccination 07/29/2019, 08/08/2019, 08/19/2019, 01/25/2020   Pneumococcal Conjugate-13 05/23/2014   Pneumococcal Polysaccharide-23 12/13/2012, 05/21/2016    TDAP status: Due, Education has been provided regarding the importance of this vaccine. Advised may receive this vaccine at local pharmacy or Health Dept. Aware to provide a copy of the vaccination record if obtained from local pharmacy or Health Dept. Verbalized acceptance and understanding.  Flu Vaccine status: Due, Education has been provided regarding the importance of this vaccine. Advised may receive this vaccine at local pharmacy or Health Dept. Aware to provide a copy of the vaccination record if obtained from local pharmacy or Health Dept. Verbalized acceptance and understanding.  Pneumococcal vaccine status: Up to date  Covid-19 vaccine status: Information provided on how to obtain vaccines.   Qualifies for Shingles Vaccine? Yes   Zostavax completed No   Shingrix Completed?: No.    Education has been provided regarding the importance of this vaccine. Patient has been advised to  call insurance company to determine out of pocket expense if they have not yet received this vaccine. Advised may also receive vaccine at local pharmacy or Health Dept. Verbalized acceptance and understanding.  Screening Tests Health Maintenance  Topic Date Due   Zoster Vaccines- Shingrix (1 of 2) Never done   TETANUS/TDAP  07/31/2016   COVID-19 Vaccine (5 - Booster) 05/25/2020   INFLUENZA VACCINE  09/16/2020   OPHTHALMOLOGY EXAM  01/23/2021   HEMOGLOBIN A1C  04/06/2021   FOOT EXAM  10/04/2021   COLONOSCOPY (Pts 45-66yr Insurance coverage will need to be confirmed)  10/19/2028   Hepatitis C Screening  Completed   PNA vac Low Risk Adult  Completed   HPV VACCINES  Aged Out    Health Maintenance  Health Maintenance Due  Topic Date Due   Zoster Vaccines- Shingrix (1 of 2) Never done   TETANUS/TDAP  07/31/2016   COVID-19 Vaccine (5 - Booster) 05/25/2020   INFLUENZA VACCINE  09/16/2020    Colorectal cancer screening: Type of screening: Colonoscopy. Completed 2020. Repeat every 10 years  Lung Cancer Screening: (Low Dose CT Chest recommended if Age 70-80years, 30 pack-year currently smoking OR have quit w/in 15years.) does not qualify.   Lung Cancer Screening Referral: na  Additional Screening:  Hepatitis C Screening: does qualify; Completed 2017  Vision Screening: Recommended annual ophthalmology exams  for early detection of glaucoma and other disorders of the eye. Is the patient up to date with their annual eye exam?  Yes  Who is the provider or what is the name of the office in which the patient attends annual eye exams? [facility] If pt is not established with a provider, would they like to be referred to a provider to establish care? No .   Dental Screening: Recommended annual dental exams for proper oral hygiene  Community Resource Referral / Chronic Care Management: CRR required this visit?  No   CCM required this visit?  No      Plan:     I have personally reviewed  and noted the following in the patient's chart:   Medical and social history Use of alcohol, tobacco or illicit drugs  Current medications and supplements including opioid prescriptions. Patient is currently taking opioid prescriptions. Information provided to patient regarding non-opioid alternatives. Patient advised to discuss non-opioid treatment plan with their provider. Functional ability and status Nutritional status Physical activity Advanced directives List of other physicians Hospitalizations, surgeries, and ER visits in previous 12 months Vitals Screenings to include cognitive, depression, and falls Referrals and appointments  In addition, I have reviewed and discussed with patient certain preventive protocols, quality metrics, and best practice recommendations. A written personalized care plan for preventive services as well as general preventive health recommendations were provided to patient.     [name], NP   10/24/2020    Virtual Visit via Telephone Note  I connected withNAME@ on 10/24/20 at 11:00 AM EDT by telephone and verified that I am speaking with the correct person using two identifiers.  Location: Patient: home Provider: [facility]    I discussed the limitations, risks, security and privacy concerns of performing an evaluation and management service by telephone and the availability of in person appointments. I also discussed with the patient that there may be a patient responsible charge related to this service. The patient expressed understanding and agreed to proceed.   I discussed the assessment and treatment plan with the patient. The patient was provided an opportunity to ask questions and all were answered. The patient agreed with the plan and demonstrated an understanding of the instructions.   The patient was advised to call back or seek an in-person evaluation if the symptoms worsen or if the condition fails to improve as anticipated.  I  provided 18 minutes of non-face-to-face time during this encounter.  Thomas [name]. Thomas [name]

## 2020-10-24 NOTE — Patient Instructions (Signed)
Mr. Thomas [name] , Thank you for taking time to come for your Medicare Wellness Visit. I appreciate your ongoing commitment to your health goals. Please review the following plan we discussed and let me know if I can assist you in the future.   Screening recommendations/referrals: Colonoscopy up to date Recommended yearly ophthalmology/optometry visit for glaucoma screening and checkup Recommended yearly dental visit for hygiene and checkup  Vaccinations: Influenza vaccine RECOMMENDED at this time, can get at office or local pharmacy Pneumococcal vaccine up to date Tdap vaccine RECOMMENDED at this time, to get at local pharmacy Shingles vaccine RECOMMENDED at [facility] stime, to get at local pharmacy    Advanced directives: bring to office to place on file. We can complete MOST form in office   Conditions/risks identified: advance age, obesity, complications related to diabetes  Next appointment: 1 year for AWV  Preventive Care 42 Years and Older, Male Preventive care refers to lifestyle choices and visits with your health care provider that can promote health and wellness. What does preventive care include? A yearly physical exam. This is also called an annual well check. Dental exams once or twice a year. Routine eye exams. Ask your health care provider how often you should have your eyes checked. Personal lifestyle choices, including: Daily care of your teeth and gums. Regular physical activity. Eating a healthy diet. Avoiding tobacco and drug use. Limiting alcohol use. Practicing safe sex. Taking low doses of aspirin every day. Taking vitamin and mineral supplements as recommended by your health care provider. What happens during an annual well check? The services and screenings done by your health care provider during your annual well check will depend on your age, overall health, lifestyle risk factors, and family history of disease. Counseling  Your health care provider may ask you  questions about your: Alcohol use. Tobacco use. Drug use. Emotional well-being. Home and relationship well-being. Sexual activity. Eating habits. History of falls. Memory and ability to understand (cognition). Work and work Statistician. Screening  You may have the following tests or measurements: Height, weight, and BMI. Blood pressure. Lipid and cholesterol levels. These may be checked every 5 years, or more frequently if you are over 51 years old. Skin check. Lung cancer screening. You may have this screening every year starting at age 70 if you have a 30-pack-year history of smoking and currently smoke or have quit within the past 15 years. Fecal occult blood test (FOBT) of the stool. You may have this test every year starting at age 70. Flexible sigmoidoscopy or colonoscopy. You may have a sigmoidoscopy every 5 years or a colonoscopy every 10 years starting at age 70. Prostate cancer screening. Recommendations will vary depending on your family history and other risks. Hepatitis C blood test. Hepatitis B blood test. Sexually transmitted disease (STD) testing. Diabetes screening. This is done by checking your blood sugar (glucose) after you have not eaten for a while (fasting). You may have this done every 1-3 years. Abdominal aortic aneurysm (AAA) screening. You may need this if you are a current or former smoker. Osteoporosis. You may be screened starting at age 70 if you are at high risk. Talk with your health care provider about your test results, treatment options, and if necessary, the need for more tests. Vaccines  Your health care provider may recommend certain vaccines, such as: Influenza vaccine. This is recommended every year. Tetanus, diphtheria, and acellular pertussis (Tdap, Td) vaccine. You may need a Td booster every 10 years. Zoster vaccine. You  may need this after age 70. Pneumococcal 13-valent conjugate (PCV13) vaccine. One dose is recommended after age  70. Pneumococcal polysaccharide (PPSV23) vaccine. One dose is recommended after age 86. Talk to your health care provider about which screenings and vaccines you need and how often you need them. This information is not intended to replace advice given to you by your health care provider. Make sure you discuss any questions you have with your health care provider. Document Released: 03/01/2015 Document Revised: 10/23/2015 Document Reviewed: 12/04/2014 Elsevier Interactive Patient Education  2017 [place] Prevention in the Home Falls can cause injuries. They can happen to people of all ages. There are many things you can do to make your home safe and to help prevent falls. What can I do on the outside of my home? Regularly fix the edges of walkways and driveways and fix any cracks. Remove anything that might make you trip as you walk through a door, such as a raised step or threshold. Trim any bushes or trees on the path to your home. Use bright outdoor lighting. Clear any walking paths of anything that might make someone trip, such as rocks or tools. Regularly check to see if handrails are loose or broken. Make sure that both sides of any steps have handrails. Any raised decks and porches should have guardrails on the edges. Have any leaves, snow, or ice cleared regularly. Use sand or salt on walking paths during winter. Clean up any spills in your garage right away. This includes oil or grease spills. What can I do in the bathroom? Use night lights. Install grab bars by the toilet and in the tub and shower. Do not use towel bars as grab bars. Use non-skid mats or decals in the tub or shower. If you need to sit down in the shower, use a plastic, non-slip stool. Keep the floor dry. Clean up any water that spills on the floor as soon as it happens. Remove soap buildup in the tub or shower regularly. Attach bath mats securely with double-sided non-slip rug tape. Do not have throw  rugs and other things on the floor that can make you trip. What can I do in the bedroom? Use night lights. Make sure that you have a light by your bed that is easy to reach. Do not use any sheets or blankets that are too big for your bed. They should not hang down onto the floor. Have a firm chair that has side arms. You can use this for support while you get dressed. Do not have throw rugs and other things on the floor that can make you trip. What can I do in the kitchen? Clean up any spills right away. Avoid walking on wet floors. Keep items that you use a lot in easy-to-reach places. If you need to reach something above you, use a strong step stool that has a grab bar. Keep electrical cords out of the way. Do not use floor polish or wax that makes floors slippery. If you must use wax, use non-skid floor wax. Do not have throw rugs and other things on the floor that can make you trip. What can I do with my stairs? Do not leave any items on the stairs. Make sure that there are handrails on both sides of the stairs and use them. Fix handrails that are broken or loose. Make sure that handrails are as long as the stairways. Check any carpeting to make sure that it is firmly  attached to the stairs. Fix any carpet that is loose or worn. Avoid having throw rugs at the top or bottom of the stairs. If you do have throw rugs, attach them to the floor with carpet tape. Make sure that you have a light switch at the top of the stairs and the bottom of the stairs. If you do not have them, ask someone to add them for you. What else can I do to help prevent falls? Wear shoes that: Do not have high heels. Have rubber bottoms. Are comfortable and fit you well. Are closed at the toe. Do not wear sandals. If you use a stepladder: Make sure that it is fully opened. Do not climb a closed stepladder. Make sure that both sides of the stepladder are locked into place. Ask someone to hold it for you, if  possible. Clearly mark and make sure that you can see: Any grab bars or handrails. First and last steps. Where the edge of each step is. Use tools that help you move around (mobility aids) if they are needed. These include: Canes. Walkers. Scooters. Crutches. Turn on the lights when you go into a dark area. Replace any light bulbs as soon as they burn out. Set up your furniture so you have a clear path. Avoid moving your furniture around. If any of your floors are uneven, fix them. If there are any pets around you, be aware of where they are. Review your medicines with your doctor. Some medicines can make you feel dizzy. This can increase your chance of falling. Ask your doctor what other things that you can do to help prevent falls. This information is not intended to replace advice given to you by your health care provider. Make sure you discuss any questions you have with your health care provider. Document Released: 11/29/2008 Document Revised: 07/11/2015 Document Reviewed: 03/09/2014 Elsevier Interactive Patient Education  2017 [employer].

## 2020-10-24 NOTE — Telephone Encounter (Signed)
Mr. Thomas [name], Thomas [name] are scheduled for a virtual visit with your provider today.    Just as we do with appointments in the office, we must obtain your consent to participate.  Your consent will be active for this visit and any virtual visit you may have with one of our providers in the next 365 days.    If you have a MyChart account, I can also send a copy of this consent to you electronically.  All virtual visits are billed to your insurance company just like a traditional visit in the office.  As this is a virtual visit, video technology does not allow for your provider to perform a traditional examination.  This may limit your provider's ability to fully assess your condition.  If your provider identifies any concerns that need to be evaluated in person or the need to arrange testing such as labs, EKG, etc, we will make arrangements to do so.    Although advances in technology are sophisticated, we cannot ensure that it will always work on either your end or our end.  If the connection with a video visit is poor, we may have to switch to a telephone visit.  With either a video or telephone visit, we are not always able to ensure that we have a secure connection.   I need to obtain your verbal consent now.   Are you willing to proceed with your visit today?   Thomas [name] has provided verbal consent on 10/24/2020 for a virtual visit (video or telephone).   Carroll Kinds, CMA 10/24/2020  11:00 AM

## 2020-10-24 NOTE — Progress Notes (Signed)
This service is provided via telemedicine  No vital signs collected/recorded due to the encounter was a telemedicine visit.   Location of patient (ex: home, work):  Home  Patient consents to a telephone visit:  Yes, see encounter dated 10/24/2020  Location of the provider (ex: office, home):  [place]  Name of any referring provider:  N/A  Names of all persons participating in the telemedicine service and their role in the encounter:  Sherrie Mustache, Nurse Practitioner, Carroll Kinds, CMA, and patient.   Time spent on call:  7 minutes with medical assistant

## 2020-10-25 DIAGNOSIS — Z4803 Encounter for change or removal of drains: Secondary | ICD-10-CM | POA: Diagnosis not present

## 2020-10-27 DIAGNOSIS — J9611 Chronic respiratory failure with hypoxia: Secondary | ICD-10-CM | POA: Diagnosis not present

## 2020-10-27 DIAGNOSIS — I2699 Other pulmonary embolism without acute cor pulmonale: Secondary | ICD-10-CM | POA: Diagnosis not present

## 2020-10-31 ENCOUNTER — Other Ambulatory Visit (HOSPITAL_BASED_OUTPATIENT_CLINIC_OR_DEPARTMENT_OTHER): Payer: Self-pay

## 2020-11-01 ENCOUNTER — Other Ambulatory Visit (HOSPITAL_COMMUNITY): Payer: Self-pay

## 2020-11-01 ENCOUNTER — Other Ambulatory Visit: Payer: Self-pay | Admitting: Nurse Practitioner

## 2020-11-01 DIAGNOSIS — M545 Low back pain, unspecified: Secondary | ICD-10-CM

## 2020-11-01 DIAGNOSIS — I5042 Chronic combined systolic (congestive) and diastolic (congestive) heart failure: Secondary | ICD-10-CM

## 2020-11-01 MED ORDER — METHOCARBAMOL 500 MG PO TABS
500.0000 mg | ORAL_TABLET | Freq: Three times a day (TID) | ORAL | 5 refills | Status: DC | PRN
Start: 2020-11-01 — End: 2021-04-08
  Filled 2020-11-01: qty 30, 10d supply, fill #0
  Filled 2020-12-25: qty 30, 10d supply, fill #1
  Filled 2021-02-07: qty 30, 10d supply, fill #2

## 2020-11-01 MED ORDER — FUROSEMIDE 40 MG PO TABS
40.0000 mg | ORAL_TABLET | Freq: Every day | ORAL | 3 refills | Status: DC
  Filled 2020-11-01: qty 90, 90d supply, fill #0
  Filled 2021-02-07: qty 90, 90d supply, fill #1

## 2020-11-07 ENCOUNTER — Other Ambulatory Visit (HOSPITAL_COMMUNITY): Payer: Self-pay

## 2020-11-13 DIAGNOSIS — G4733 Obstructive sleep apnea (adult) (pediatric): Secondary | ICD-10-CM | POA: Diagnosis not present

## 2020-11-14 ENCOUNTER — Other Ambulatory Visit (HOSPITAL_COMMUNITY): Payer: Self-pay

## 2020-11-14 ENCOUNTER — Other Ambulatory Visit: Payer: Self-pay | Admitting: Nurse Practitioner

## 2020-11-14 DIAGNOSIS — M170 Bilateral primary osteoarthritis of knee: Secondary | ICD-10-CM

## 2020-11-15 ENCOUNTER — Other Ambulatory Visit (HOSPITAL_COMMUNITY): Payer: Self-pay

## 2020-11-15 DIAGNOSIS — R339 Retention of urine, unspecified: Secondary | ICD-10-CM | POA: Diagnosis not present

## 2020-11-15 DIAGNOSIS — I2699 Other pulmonary embolism without acute cor pulmonale: Secondary | ICD-10-CM | POA: Diagnosis not present

## 2020-11-15 DIAGNOSIS — J9611 Chronic respiratory failure with hypoxia: Secondary | ICD-10-CM | POA: Diagnosis not present

## 2020-11-15 MED ORDER — TRAMADOL HCL 50 MG PO TABS
50.0000 mg | ORAL_TABLET | Freq: Four times a day (QID) | ORAL | 0 refills | Status: DC | PRN
  Filled 2020-11-15: qty 30, 8d supply, fill #0

## 2020-11-15 NOTE — Telephone Encounter (Signed)
RX was last filled on 10/07/20. Pending appt in November to sign treatment agreement

## 2020-11-19 ENCOUNTER — Other Ambulatory Visit (HOSPITAL_COMMUNITY): Payer: Self-pay

## 2020-11-25 DIAGNOSIS — Z4803 Encounter for change or removal of drains: Secondary | ICD-10-CM | POA: Diagnosis not present

## 2020-11-26 DIAGNOSIS — J9611 Chronic respiratory failure with hypoxia: Secondary | ICD-10-CM | POA: Diagnosis not present

## 2020-11-26 DIAGNOSIS — I2699 Other pulmonary embolism without acute cor pulmonale: Secondary | ICD-10-CM | POA: Diagnosis not present

## 2020-12-03 ENCOUNTER — Other Ambulatory Visit (HOSPITAL_COMMUNITY): Payer: Self-pay

## 2020-12-03 ENCOUNTER — Other Ambulatory Visit: Payer: Self-pay | Admitting: Nurse Practitioner

## 2020-12-03 DIAGNOSIS — I10 Essential (primary) hypertension: Secondary | ICD-10-CM

## 2020-12-03 MED ORDER — HYDRALAZINE HCL 25 MG PO TABS
75.0000 mg | ORAL_TABLET | Freq: Three times a day (TID) | ORAL | 2 refills | Status: DC
  Filled 2020-12-03: qty 810, 90d supply, fill #0

## 2020-12-04 ENCOUNTER — Other Ambulatory Visit (HOSPITAL_COMMUNITY): Payer: Self-pay

## 2020-12-04 MED FILL — Losartan Potassium Tab 50 MG: ORAL | 90 days supply | Qty: 90 | Fill #2 | Status: AC

## 2020-12-05 ENCOUNTER — Other Ambulatory Visit (HOSPITAL_COMMUNITY): Payer: Self-pay

## 2020-12-15 DIAGNOSIS — I2699 Other pulmonary embolism without acute cor pulmonale: Secondary | ICD-10-CM | POA: Diagnosis not present

## 2020-12-15 DIAGNOSIS — J9611 Chronic respiratory failure with hypoxia: Secondary | ICD-10-CM | POA: Diagnosis not present

## 2020-12-16 DIAGNOSIS — R339 Retention of urine, unspecified: Secondary | ICD-10-CM | POA: Diagnosis not present

## 2020-12-23 DIAGNOSIS — R339 Retention of urine, unspecified: Secondary | ICD-10-CM | POA: Diagnosis not present

## 2020-12-23 DIAGNOSIS — Z466 Encounter for fitting and adjustment of urinary device: Secondary | ICD-10-CM | POA: Diagnosis not present

## 2020-12-25 ENCOUNTER — Other Ambulatory Visit: Payer: Self-pay | Admitting: Nurse Practitioner

## 2020-12-25 ENCOUNTER — Other Ambulatory Visit (HOSPITAL_COMMUNITY): Payer: Self-pay

## 2020-12-25 DIAGNOSIS — M170 Bilateral primary osteoarthritis of knee: Secondary | ICD-10-CM

## 2020-12-25 MED ORDER — TRAMADOL HCL 50 MG PO TABS
50.0000 mg | ORAL_TABLET | Freq: Four times a day (QID) | ORAL | 0 refills | Status: DC | PRN
  Filled 2020-12-25: qty 30, 8d supply, fill #0

## 2020-12-25 NOTE — Telephone Encounter (Signed)
Pharmacy requested refill.  Epic LR: 11/15/2020 Contract date: 01/27/19 Note added to upcoming appointment to update contract.  Pended Rx and sent to [facility] for approval due to Janett Billow out of office.

## 2020-12-26 ENCOUNTER — Other Ambulatory Visit (HOSPITAL_COMMUNITY): Payer: Self-pay

## 2020-12-27 DIAGNOSIS — I2699 Other pulmonary embolism without acute cor pulmonale: Secondary | ICD-10-CM | POA: Diagnosis not present

## 2020-12-27 DIAGNOSIS — J9611 Chronic respiratory failure with hypoxia: Secondary | ICD-10-CM | POA: Diagnosis not present

## 2021-01-06 ENCOUNTER — Encounter: Payer: Self-pay | Admitting: Nurse Practitioner

## 2021-01-06 ENCOUNTER — Other Ambulatory Visit: Payer: Self-pay

## 2021-01-06 ENCOUNTER — Ambulatory Visit (INDEPENDENT_AMBULATORY_CARE_PROVIDER_SITE_OTHER): Payer: Medicare HMO | Admitting: Nurse Practitioner

## 2021-01-06 VITALS — BP 126/78 | HR 67 | Temp 97.9°F | Ht 69.0 in | Wt 299.0 lb

## 2021-01-06 DIAGNOSIS — I5022 Chronic systolic (congestive) heart failure: Secondary | ICD-10-CM

## 2021-01-06 DIAGNOSIS — I10 Essential (primary) hypertension: Secondary | ICD-10-CM | POA: Diagnosis not present

## 2021-01-06 DIAGNOSIS — E1122 Type 2 diabetes mellitus with diabetic chronic kidney disease: Secondary | ICD-10-CM | POA: Diagnosis not present

## 2021-01-06 DIAGNOSIS — Z86711 Personal history of pulmonary embolism: Secondary | ICD-10-CM | POA: Diagnosis not present

## 2021-01-06 DIAGNOSIS — M1711 Unilateral primary osteoarthritis, right knee: Secondary | ICD-10-CM

## 2021-01-06 DIAGNOSIS — E785 Hyperlipidemia, unspecified: Secondary | ICD-10-CM | POA: Diagnosis not present

## 2021-01-06 DIAGNOSIS — R339 Retention of urine, unspecified: Secondary | ICD-10-CM

## 2021-01-06 DIAGNOSIS — M1991 Primary osteoarthritis, unspecified site: Secondary | ICD-10-CM

## 2021-01-06 DIAGNOSIS — G4730 Sleep apnea, unspecified: Secondary | ICD-10-CM | POA: Diagnosis not present

## 2021-01-06 DIAGNOSIS — I480 Paroxysmal atrial fibrillation: Secondary | ICD-10-CM

## 2021-01-06 DIAGNOSIS — Z23 Encounter for immunization: Secondary | ICD-10-CM | POA: Diagnosis not present

## 2021-01-06 NOTE — Progress Notes (Addendum)
Careteam: Patient Care Team: [name], NP as PCP - General (Geriatric Medicine) Jettie Booze, MD as PCP - Cardiology (Cardiology) Katy Fitch, Darlina Guys, MD as Consulting Physician (Ophthalmology)  PLACE OF SERVICE:  [place] Directive information Does Patient Have a Medical Advance Directive?: No, Would patient like information on creating a medical advance directive?: No - Patient declined  Allergies  Allergen Reactions   Lisinopril Cough    Chief Complaint  Patient presents with   Medical Management of Chronic Issues    Routine follow-up. Discuss need for covid # 5. Flu vaccine today. Refused shingles vaccine. Discuss need for td/tdap.     HPI: Patient is a 70 y.o. male routine follow up Pt with hx of urinary retention- followed by urology and has chronic foley catheter. Changed by urology routinely, having more issues and has appt next month scheduled.    Htn- controlled on current medication.   OSA-recall on his equipment. He has a letter to get new supplies.   OA/Back pain- on and off pain. Using tramadol 1-2 times daily also taking tylenol- uses that every time he needs to leave the house.  Needs a light weight WC due to the inability to stand, transfer due to pain in knees. Unable to use a walker or cane to get relief. Pt is able to self propel with wheelchair to get around and help him with ADls.   Hx of PE- on xarelto for lifetime.   DM- continues on glipizide, jardiance, metformin, and trulicity. No hypoglycemia noted.   Obesity- losing weight, has made some soup which has a lot of vegetable and protein.    Review of Systems:  Review of Systems  Constitutional:  Negative for chills, fever and weight loss.  HENT:  Negative for tinnitus.   Respiratory:  Negative for cough, sputum production and shortness of breath.   Cardiovascular:  Negative for chest pain, palpitations and leg swelling.  Gastrointestinal:  Negative for  abdominal pain, constipation, diarrhea and heartburn.  Genitourinary:  Negative for dysuria, frequency and urgency.       Chronic foley with leakage.   Musculoskeletal:  Positive for back pain, joint pain and myalgias. Negative for falls.  Skin: Negative.   Neurological:  Negative for dizziness and headaches.  Psychiatric/Behavioral:  Negative for depression and memory loss. The patient does not have insomnia.    Past Medical History:  Diagnosis Date   Acute on chronic diastolic CHF (congestive heart failure) ([place]) 06/01/2017   Arthritis    Cancer ([place]) 2010   Prostate   Chronic kidney disease    Diabetes mellitus    Diabetic neuropathy ([place])    feet   DVT (deep venous thrombosis) ([place])    Genetic testing 06/22/2016   Mr. Nesmith underwent genetic counseling and testing for hereditary cancer syndromes on 05/14/2016. His results were negative for mutations in all 46 genes analyzed by [facility]'s 46-gene Common Hereditary Cancers Panel. Genes analyzed include: APC, ATM, AXIN2, BARD1, BMPR1A, BRCA1, BRCA2, BRIP1, CDH1, CDKN2A, CHEK2, CTNNA1, DICER1, EPCAM, GREM1, HOXB13, KIT, MEN1, MLH1, MSH2, MSH3, MSH6, MUTYH, NB   GERD (gastroesophageal reflux disease)    Hypertension    PE (pulmonary thromboembolism) (HCC)    Pneumonia    Sleep apnea    not wearing CPAP   Suprapubic catheter ([place]) 11/17/2019   Past Surgical History:  Procedure Laterality Date   CARDIOVERSION N/A 06/03/2017   Procedure: CARDIOVERSION;  Surgeon: Jerline Pain, MD;  Location: [facility];  Service: Cardiovascular;  Laterality: N/A;   COLONOSCOPY     COLONOSCOPY WITH PROPOFOL N/A 10/20/2018   Procedure: COLONOSCOPY WITH PROPOFOL;  Surgeon: Thornton Park, MD;  Location: [facility];  Service: Gastroenterology;  Laterality: N/A;   HERNIA REPAIR     KNEE SURGERY     MULTIPLE TOOTH EXTRACTIONS     POLYPECTOMY  10/20/2018   Procedure: POLYPECTOMY;  Surgeon: Thornton Park, MD;  Location: [facility];  Service:  Gastroenterology;;   PROSTATE SURGERY     RADIOACTIVE SEED IMPLANT     RIGHT/LEFT HEART CATH AND CORONARY ANGIOGRAPHY N/A 07/06/2017   Procedure: RIGHT/LEFT HEART CATH AND CORONARY ANGIOGRAPHY;  Surgeon: Jettie Booze, MD;  Location: [facility] [facility];  Service: Cardiovascular;  Laterality: N/A;   SHOULDER SURGERY     TOTAL KNEE ARTHROPLASTY Right 11/19/2016   Procedure: RIGHT TOTAL KNEE ARTHROPLASTY;  Surgeon: Leandrew Koyanagi, MD;  Location: [place];  Service: Orthopedics;  Laterality: Right;   uretha surgery-2014     Social History:   reports that he has never smoked. He has never used smokeless tobacco. He reports that he does not drink alcohol and does not use drugs.  Family History  Problem Relation Age of Onset   Breast cancer Mother 62       d.44   Breast cancer Sister 30       d.30   Leukemia Brother 18       d.20   Breast cancer Maternal Aunt 40       d.26s   Lung cancer Maternal Uncle    Prostate cancer Paternal Uncle    Prostate cancer Brother        recurred recently at age 13   Other Brother 56       spinal tumor   Cervical cancer Other 41       d.22   Cancer Sister 73       unspecified type   Cancer Maternal Uncle 78       unspecified type    Medications: Patient's Medications  New Prescriptions   No medications on file  Previous Medications   ACCU-CHEK SOFTCLIX LANCETS LANCETS    Use to test blood sugar daily. Dx: E11.22   ALCOHOL SWABS (B-D SINGLE USE SWABS REGULAR) PADS    Use in testing blood sugar daily. Dx: E11.22   AMLODIPINE (NORVASC) 10 MG TABLET    TAKE 1 TABLET EVERY DAY FOR HIGH BLOOD PRESSURE   ASPIRIN EC 81 MG TABLET    Take 81 mg by mouth daily.   ATORVASTATIN (LIPITOR) 40 MG TABLET    Take 1 tablet (40 mg total) by mouth daily.   BLOOD GLUCOSE CALIBRATION (ACCU-CHEK AVIVA) SOLN    Use once daily as directed dx E11.22   CARVEDILOL (COREG) 12.5 MG TABLET    Take 1 tablet (12.5 mg total) by mouth 2 (two) times daily with a meal.    DULAGLUTIDE (TRULICITY) 8.11 BJ/4.7WG SOPN    Inject 0.75 mg into the skin once a week.   EMPAGLIFLOZIN (JARDIANCE) 10 MG TABS TABLET    Take 1 tablet (10 mg total) by mouth daily.   FUROSEMIDE (LASIX) 40 MG TABLET    Take 1 tablet (40 mg total) by mouth daily.   GLIPIZIDE (GLUCOTROL) 5 MG TABLET    TAKE 1 TABLET EVERY DAY BEFORE BREAKFAST   GLUCOSE BLOOD (ACCU-CHEK AVIVA PLUS) TEST STRIP    1 each by Other route as needed for other. Use to test  blood sugar daily. Dx: E11.22   HYDRALAZINE (APRESOLINE) 25 MG TABLET    Take 3 tablets (75 mg total) by mouth 3 (three) times daily.   LOSARTAN (COZAAR) 50 MG TABLET    Take 1 tablet (50 mg total) by mouth daily.   METFORMIN (GLUCOPHAGE) 500 MG TABLET    TAKE 1 TABLET (500 MG TOTAL) BY MOUTH 2 TIMES DAILY WITH A MEAL.   METHOCARBAMOL (ROBAXIN) 500 MG TABLET    Take 1 tablet (500 mg total) by mouth every 8 (eight) hours as needed for muscle spasms.   MULTIPLE VITAMINS-MINERALS (VITAMIN D3 COMPLETE PO)    Take 1 tablet by mouth daily. Gummy   POTASSIUM CHLORIDE SA (KLOR-CON) 20 MEQ TABLET    Take 1 tablet (20 mEq total) by mouth 2 (two) times daily.   RIVAROXABAN (XARELTO) 20 MG TABS TABLET    Take 1 tablet (20 mg total) by mouth daily with supper.   SENNA 8.7 MG CHEW    Chew by mouth as needed.   TRAMADOL (ULTRAM) 50 MG TABLET    Take 1 tablet (50 mg total) by mouth every 6 (six) hours as needed for pain  Modified Medications   No medications on file  Discontinued Medications   MULTIPLE VITAMINS-MINERALS (EMERGEN-C VITAMIN C PO)    Take 1 tablet by mouth daily.   VITAMIN D PO    Take 2 tablets by mouth daily. Gummies    Physical Exam:  Vitals:   01/06/21 0800  BP: 126/78  Pulse: 67  Temp: 97.9 F (36.6 C)  TempSrc: Temporal  SpO2: 93%  Weight: 299 lb (135.6 kg)  Height: _0  (1.753 m)   Body mass index is 44.15 kg/m. Wt Readings from Last 3 Encounters:  01/06/21 299 lb (135.6 kg)  03/15/20 (!) 320 lb 3.2 oz (145.2 kg)  09/15/19 (!) 328  lb 9.6 oz (149.1 kg)    Physical Exam Constitutional:      General: He is not in acute distress.    Appearance: He is well-developed. He is not diaphoretic.  HENT:     Head: Normocephalic and atraumatic.     Right Ear: External ear normal.     Left Ear: External ear normal.     Mouth/Throat:     Pharynx: No oropharyngeal exudate.  Eyes:     Conjunctiva/sclera: Conjunctivae normal.     Pupils: Pupils are equal, round, and reactive to light.  Cardiovascular:     Rate and Rhythm: Normal rate and regular rhythm.     Heart sounds: Normal heart sounds.  Pulmonary:     Effort: Pulmonary effort is normal.     Breath sounds: Normal breath sounds.  Abdominal:     General: Bowel sounds are normal.     Palpations: Abdomen is soft.  Musculoskeletal:        General: No tenderness.     Cervical back: Normal range of motion and neck supple.     Right lower leg: No edema.     Left lower leg: No edema.  Skin:    General: Skin is warm and dry.  Neurological:     Mental Status: He is alert and oriented to person, place, and time.  Psychiatric:        Mood and Affect: Mood normal.        Behavior: Behavior normal.    Labs reviewed: Basic Metabolic Panel: Recent Labs    03/15/20 1216 10/04/20 1013  NA 142 143  K 4.3 3.5  CL 107 103  CO2 22 29  GLUCOSE 102 205*  BUN 10 8  CREATININE 1.12 1.00  CALCIUM 9.3 9.1   Liver Function Tests: Recent Labs    03/15/20 1216 10/04/20 1013  AST 14 13  ALT 15 14  BILITOT 0.6 0.9  PROT 7.4 7.4   No results for input(s): LIPASE, AMYLASE in the last 8760 hours. No results for input(s): AMMONIA in the last 8760 hours. CBC: Recent Labs    03/15/20 1216 10/04/20 1013  WBC 7.4 8.2  NEUTROABS 5,284 6,798  HGB 14.3 14.7  HCT 43.9 45.9  MCV 84.3 82.4  PLT 321 331   Lipid Panel: No results for input(s): CHOL, HDL, LDLCALC, TRIG, CHOLHDL, LDLDIRECT in the last 8760 hours. TSH: No results for input(s): TSH in the last 8760  hours. A1C: Lab Results  Component Value Date   HGBA1C 7.4 (H) 10/04/2020     Assessment/Plan 1. Need for immunization against influenza - Flu Vaccine QUAD High Dose(Fluad)  2. Type 2 diabetes mellitus with chronic kidney disease, without long-term current use of insulin, unspecified CKD stage ([place]) -Encouraged dietary compliance, routine foot care/monitoring and to keep up with diabetic eye exams through ophthalmology  -The current medical regimen is effective;  continue present plan and medication - Flu Vaccine QUAD High Dose(Fluad) - Hemoglobin A1c  3. Primary osteoarthritis, unspecified site -ongoing, encouraged weight loss, continue with exercises for strengthening. Continues tramadol PRN.  -order for light weigh wheelchair given due to the pain from OA.   4. Urinary retention Followed by urology, chronic catheter.   5. Hyperlipidemia LDL goal <70 -continue lipitor with dietary modifications.  - Lipid panel - CMP with eGFR([facility])  6. History of pulmonary embolism No signs of blood loss, continue on xarelto for prevention.  - CBC with Differential/Platelet  8. Chronic systolic heart failure (HCC) Stable, euvolemic, continues on coreg, hydralazine, losartan, lasix   9. Primary hypertension -Blood pressure well controlled Continue current medications Recheck metabolic panel  10. Sleep apnea, unspecified type -continues on cpap.   11. Paroxysmal atrial fibrillation ([place]) -controlled, continues on xarelto and coreg. No palpitations noted.    Next appt: 4 months.  Carlos American. [place], [facility] [phone]

## 2021-01-06 NOTE — Patient Instructions (Signed)
Can get shingles, TDAP, COVID booster at your local pharmacy  Increase physical activity- lots of chair activities.

## 2021-01-07 LAB — CBC WITH DIFFERENTIAL/PLATELET
Absolute Monocytes: 534 cells/uL (ref 200–950)
Basophils Absolute: 62 cells/uL (ref 0–200)
Basophils Relative: 0.7 %
Eosinophils Absolute: 908 cells/uL — ABNORMAL HIGH (ref 15–500)
Eosinophils Relative: 10.2 %
HCT: 41.1 % (ref 38.5–50.0)
Hemoglobin: 13.3 g/dL (ref 13.2–17.1)
Lymphs Abs: 1228 cells/uL (ref 850–3900)
MCH: 27.2 pg (ref 27.0–33.0)
MCHC: 32.4 g/dL (ref 32.0–36.0)
MCV: 84 fL (ref 80.0–100.0)
MPV: 10.2 fL (ref 7.5–12.5)
Monocytes Relative: 6 %
Neutro Abs: 6168 cells/uL (ref 1500–7800)
Neutrophils Relative %: 69.3 %
Platelets: 371 10*3/uL (ref 140–400)
RBC: 4.89 10*6/uL (ref 4.20–5.80)
RDW: 13.7 % (ref 11.0–15.0)
Total Lymphocyte: 13.8 %
WBC: 8.9 10*3/uL (ref 3.8–10.8)

## 2021-01-07 LAB — LIPID PANEL
Cholesterol: 211 mg/dL — ABNORMAL HIGH (ref <200)
HDL: 40 mg/dL (ref >=40)
LDL Cholesterol (Calc): 145 mg/dL (calc) — ABNORMAL HIGH
Non-HDL Cholesterol (Calc): 171 mg/dL (calc) — ABNORMAL HIGH (ref <130)
Total CHOL/HDL Ratio: 5.3 (calc) — ABNORMAL HIGH (ref <5.0)
Triglycerides: 134 mg/dL (ref <150)

## 2021-01-07 LAB — HEMOGLOBIN A1C
Hgb A1c MFr Bld: 5.9 % of total Hgb — ABNORMAL HIGH (ref <5.7)
Mean Plasma Glucose: 123 mg/dL
eAG (mmol/L): 6.8 mmol/L

## 2021-01-07 LAB — COMPLETE METABOLIC PANEL WITH GFR
AG Ratio: 1 (calc) (ref 1.0–2.5)
ALT: 12 U/L (ref 9–46)
AST: 11 U/L (ref 10–35)
Albumin: 3.8 g/dL (ref 3.6–5.1)
Alkaline phosphatase (APISO): 60 U/L (ref 35–144)
BUN: 13 mg/dL (ref 7–25)
CO2: 30 mmol/L (ref 20–32)
Calcium: 9.5 mg/dL (ref 8.6–10.3)
Chloride: 102 mmol/L (ref 98–110)
Creat: 1.13 mg/dL (ref 0.70–1.28)
Globulin: 3.7 g/dL (calc) (ref 1.9–3.7)
Glucose, Bld: 149 mg/dL — ABNORMAL HIGH (ref 65–99)
Potassium: 3.7 mmol/L (ref 3.5–5.3)
Sodium: 142 mmol/L (ref 135–146)
Total Bilirubin: 0.8 mg/dL (ref 0.2–1.2)
Total Protein: 7.5 g/dL (ref 6.1–8.1)
eGFR: 70 mL/min/{1.73_m2} (ref >=60)

## 2021-01-14 NOTE — Addendum Note (Signed)
Addended by: [name] on: 01/14/2021 04:07 PM   Modules accepted: Orders

## 2021-02-06 DIAGNOSIS — R339 Retention of urine, unspecified: Secondary | ICD-10-CM | POA: Diagnosis not present

## 2021-02-06 DIAGNOSIS — Z79899 Other long term (current) drug therapy: Secondary | ICD-10-CM | POA: Diagnosis not present

## 2021-02-06 DIAGNOSIS — N35919 Unspecified urethral stricture, male, unspecified site: Secondary | ICD-10-CM | POA: Diagnosis not present

## 2021-02-07 ENCOUNTER — Other Ambulatory Visit: Payer: Self-pay | Admitting: Nurse Practitioner

## 2021-02-07 ENCOUNTER — Other Ambulatory Visit (HOSPITAL_COMMUNITY): Payer: Self-pay

## 2021-02-07 ENCOUNTER — Other Ambulatory Visit: Payer: Self-pay | Admitting: Family

## 2021-02-07 DIAGNOSIS — E1122 Type 2 diabetes mellitus with diabetic chronic kidney disease: Secondary | ICD-10-CM

## 2021-02-07 DIAGNOSIS — M170 Bilateral primary osteoarthritis of knee: Secondary | ICD-10-CM

## 2021-02-07 MED ORDER — TRAMADOL HCL 50 MG PO TABS
50.0000 mg | ORAL_TABLET | Freq: Four times a day (QID) | ORAL | 0 refills | Status: DC | PRN
  Filled 2021-02-07: qty 30, 8d supply, fill #0

## 2021-02-07 MED ORDER — METFORMIN HCL 500 MG PO TABS
500.0000 mg | ORAL_TABLET | Freq: Two times a day (BID) | ORAL | 1 refills | Status: DC
  Filled 2021-02-07: qty 180, 90d supply, fill #0

## 2021-02-07 NOTE — Telephone Encounter (Signed)
RX last refilled 12/25/2020. No treatment agreement on file, notation made on pending appointment

## 2021-02-26 DIAGNOSIS — I2699 Other pulmonary embolism without acute cor pulmonale: Secondary | ICD-10-CM | POA: Diagnosis not present

## 2021-02-26 DIAGNOSIS — J9611 Chronic respiratory failure with hypoxia: Secondary | ICD-10-CM | POA: Diagnosis not present

## 2021-03-10 DIAGNOSIS — Z466 Encounter for fitting and adjustment of urinary device: Secondary | ICD-10-CM | POA: Diagnosis not present

## 2021-03-10 DIAGNOSIS — C61 Malignant neoplasm of prostate: Secondary | ICD-10-CM | POA: Diagnosis not present

## 2021-03-10 DIAGNOSIS — N320 Bladder-neck obstruction: Secondary | ICD-10-CM | POA: Diagnosis not present

## 2021-03-17 ENCOUNTER — Other Ambulatory Visit (HOSPITAL_COMMUNITY): Payer: Self-pay

## 2021-03-17 ENCOUNTER — Other Ambulatory Visit: Payer: Self-pay | Admitting: Family

## 2021-03-17 ENCOUNTER — Other Ambulatory Visit: Payer: Self-pay | Admitting: Nurse Practitioner

## 2021-03-17 DIAGNOSIS — I10 Essential (primary) hypertension: Secondary | ICD-10-CM

## 2021-03-17 DIAGNOSIS — J9611 Chronic respiratory failure with hypoxia: Secondary | ICD-10-CM | POA: Diagnosis not present

## 2021-03-17 DIAGNOSIS — M170 Bilateral primary osteoarthritis of knee: Secondary | ICD-10-CM

## 2021-03-17 DIAGNOSIS — I2699 Other pulmonary embolism without acute cor pulmonale: Secondary | ICD-10-CM | POA: Diagnosis not present

## 2021-03-17 DIAGNOSIS — E1122 Type 2 diabetes mellitus with diabetic chronic kidney disease: Secondary | ICD-10-CM

## 2021-03-17 MED ORDER — LOSARTAN POTASSIUM 50 MG PO TABS
50.0000 mg | ORAL_TABLET | Freq: Every day | ORAL | 3 refills | Status: DC
  Filled 2021-03-17: qty 90, 90d supply, fill #0

## 2021-03-17 MED ORDER — ACCU-CHEK AVIVA PLUS VI STRP
ORAL_STRIP | 3 refills | Status: DC
  Filled 2021-03-17: qty 100, 90d supply, fill #0

## 2021-03-17 MED ORDER — TRAMADOL HCL 50 MG PO TABS
50.0000 mg | ORAL_TABLET | Freq: Four times a day (QID) | ORAL | 0 refills | Status: DC | PRN
  Filled 2021-03-17: qty 30, 8d supply, fill #0

## 2021-03-17 NOTE — Telephone Encounter (Signed)
Patient has request refill on medication "Tramadol". Patient last refill dated 02/07/2021. Patient has Non Opioid Contract on file 03/25/2020. Patient has upcoming appointment 05/12/2021. Patient already has "Update Contract" added in appointment notes. Medication pend and sent to PCP Dewaine Oats Carlos American, NP for approval.

## 2021-03-17 NOTE — Telephone Encounter (Signed)
Patient has request refill on medication "Losartan". Patient last refill date 03/15/2020. Patient medication has warnings. Medication pend and sent to PCP [name], NP .

## 2021-03-18 ENCOUNTER — Other Ambulatory Visit: Payer: Self-pay | Admitting: *Deleted

## 2021-03-18 ENCOUNTER — Other Ambulatory Visit (HOSPITAL_COMMUNITY): Payer: Self-pay

## 2021-03-18 MED ORDER — ACCU-CHEK SOFTCLIX LANCETS MISC
3 refills | Status: DC
  Filled 2021-03-18: qty 100, 90d supply, fill #0

## 2021-03-18 MED ORDER — ACCU-CHEK SOFTCLIX LANCET DEV KIT
PACK | 0 refills | Status: DC
  Filled 2021-03-18: qty 1, 1d supply, fill #0

## 2021-03-18 NOTE — Telephone Encounter (Signed)
Patient wife requested refill on Lancets and the Lancet Device. Stated that his does not work anymore.

## 2021-03-19 ENCOUNTER — Other Ambulatory Visit (HOSPITAL_COMMUNITY): Payer: Self-pay

## 2021-03-29 DIAGNOSIS — J9611 Chronic respiratory failure with hypoxia: Secondary | ICD-10-CM | POA: Diagnosis not present

## 2021-03-29 DIAGNOSIS — I2699 Other pulmonary embolism without acute cor pulmonale: Secondary | ICD-10-CM | POA: Diagnosis not present

## 2021-04-02 ENCOUNTER — Other Ambulatory Visit: Payer: Self-pay

## 2021-04-02 DIAGNOSIS — I10 Essential (primary) hypertension: Secondary | ICD-10-CM

## 2021-04-02 DIAGNOSIS — E1122 Type 2 diabetes mellitus with diabetic chronic kidney disease: Secondary | ICD-10-CM

## 2021-04-02 MED ORDER — GLIPIZIDE 5 MG PO TABS: ORAL_TABLET | ORAL | 3 refills | Status: DC

## 2021-04-02 MED ORDER — ATORVASTATIN CALCIUM 40 MG PO TABS: 40.0000 mg | ORAL_TABLET | Freq: Every day | ORAL | 3 refills | Status: DC

## 2021-04-08 ENCOUNTER — Other Ambulatory Visit: Payer: Self-pay | Admitting: *Deleted

## 2021-04-08 DIAGNOSIS — M170 Bilateral primary osteoarthritis of knee: Secondary | ICD-10-CM

## 2021-04-08 DIAGNOSIS — M545 Low back pain, unspecified: Secondary | ICD-10-CM

## 2021-04-08 DIAGNOSIS — I10 Essential (primary) hypertension: Secondary | ICD-10-CM

## 2021-04-08 DIAGNOSIS — I5042 Chronic combined systolic (congestive) and diastolic (congestive) heart failure: Secondary | ICD-10-CM

## 2021-04-08 DIAGNOSIS — E1122 Type 2 diabetes mellitus with diabetic chronic kidney disease: Secondary | ICD-10-CM

## 2021-04-08 DIAGNOSIS — I509 Heart failure, unspecified: Secondary | ICD-10-CM

## 2021-04-08 MED ORDER — HYDRALAZINE HCL 25 MG PO TABS: 75.0000 mg | ORAL_TABLET | Freq: Three times a day (TID) | ORAL | 1 refills | Status: DC

## 2021-04-08 MED ORDER — CARVEDILOL 12.5 MG PO TABS: 12.5000 mg | ORAL_TABLET | Freq: Two times a day (BID) | ORAL | 1 refills | Status: DC

## 2021-04-08 MED ORDER — METFORMIN HCL 500 MG PO TABS: 500.0000 mg | ORAL_TABLET | Freq: Two times a day (BID) | ORAL | 1 refills | Status: DC

## 2021-04-08 MED ORDER — METHOCARBAMOL 500 MG PO TABS: 500.0000 mg | ORAL_TABLET | Freq: Three times a day (TID) | ORAL | 5 refills | Status: DC | PRN

## 2021-04-08 MED ORDER — LOSARTAN POTASSIUM 50 MG PO TABS: 50.0000 mg | ORAL_TABLET | Freq: Every day | ORAL | 1 refills | Status: DC

## 2021-04-08 MED ORDER — FUROSEMIDE 40 MG PO TABS: 40.0000 mg | ORAL_TABLET | Freq: Every day | ORAL | 1 refills | Status: DC

## 2021-04-08 NOTE — Telephone Encounter (Signed)
Alisa with [facility] pharmacy called requesting refills.  Pended Rx's and sent to [facility] for approval.  Tramadol LR: 03/17/2021 #30

## 2021-04-09 ENCOUNTER — Telehealth: Payer: Self-pay | Admitting: *Deleted

## 2021-04-09 DIAGNOSIS — Z4803 Encounter for change or removal of drains: Secondary | ICD-10-CM | POA: Diagnosis not present

## 2021-04-09 NOTE — Telephone Encounter (Signed)
Patient dropped off the [place] Patient Assistance Form [phone] for Jardiance. Forms have been filled out by patient.  Placed in [place] folder to review and sign.  To be faxed to Fax: [phone] once completed.   Patient requested some samples of Jardiance 10mg  if we had until he can get approved for patient assistance. Samples given.

## 2021-04-14 NOTE — Telephone Encounter (Signed)
Forms faxed back to [employer] Fax:[phone]

## 2021-04-15 DIAGNOSIS — J9611 Chronic respiratory failure with hypoxia: Secondary | ICD-10-CM | POA: Diagnosis not present

## 2021-04-15 DIAGNOSIS — I2699 Other pulmonary embolism without acute cor pulmonale: Secondary | ICD-10-CM | POA: Diagnosis not present

## 2021-04-26 DIAGNOSIS — J9611 Chronic respiratory failure with hypoxia: Secondary | ICD-10-CM | POA: Diagnosis not present

## 2021-04-26 DIAGNOSIS — I2699 Other pulmonary embolism without acute cor pulmonale: Secondary | ICD-10-CM | POA: Diagnosis not present

## 2021-05-07 DIAGNOSIS — R338 Other retention of urine: Secondary | ICD-10-CM | POA: Diagnosis not present

## 2021-05-07 DIAGNOSIS — Z466 Encounter for fitting and adjustment of urinary device: Secondary | ICD-10-CM | POA: Diagnosis not present

## 2021-05-07 DIAGNOSIS — C61 Malignant neoplasm of prostate: Secondary | ICD-10-CM | POA: Diagnosis not present

## 2021-05-08 ENCOUNTER — Encounter (HOSPITAL_COMMUNITY): Payer: Self-pay | Admitting: Pharmacy Technician

## 2021-05-08 ENCOUNTER — Emergency Department (HOSPITAL_COMMUNITY)
Admission: EM | Admit: 2021-05-08 | Discharge: 2021-05-08 | Discharge status: Against Medical Advice | Payer: Medicare HMO | Attending: Emergency Medicine | Admitting: Emergency Medicine

## 2021-05-08 ENCOUNTER — Other Ambulatory Visit: Payer: Self-pay

## 2021-05-08 ENCOUNTER — Emergency Department (HOSPITAL_COMMUNITY): Payer: Medicare HMO

## 2021-05-08 DIAGNOSIS — Z5321 Procedure and treatment not carried out due to patient leaving prior to being seen by health care provider: Secondary | ICD-10-CM | POA: Insufficient documentation

## 2021-05-08 DIAGNOSIS — R002 Palpitations: Secondary | ICD-10-CM | POA: Insufficient documentation

## 2021-05-08 DIAGNOSIS — I517 Cardiomegaly: Secondary | ICD-10-CM | POA: Diagnosis not present

## 2021-05-08 LAB — CBC WITH DIFFERENTIAL/PLATELET
Abs Immature Granulocytes: 0.03 10*3/uL (ref 0.00–0.07)
Basophils Absolute: 0.1 10*3/uL (ref 0.0–0.1)
Basophils Relative: 1 %
Eosinophils Absolute: 0.2 10*3/uL (ref 0.0–0.5)
Eosinophils Relative: 2 %
HCT: 43.1 % (ref 39.0–52.0)
Hemoglobin: 13.5 g/dL (ref 13.0–17.0)
Immature Granulocytes: 0 %
Lymphocytes Relative: 16 %
Lymphs Abs: 1.3 10*3/uL (ref 0.7–4.0)
MCH: 27.7 pg (ref 26.0–34.0)
MCHC: 31.3 g/dL (ref 30.0–36.0)
MCV: 88.5 fL (ref 80.0–100.0)
Monocytes Absolute: 0.5 10*3/uL (ref 0.1–1.0)
Monocytes Relative: 6 %
Neutro Abs: 6.2 10*3/uL (ref 1.7–7.7)
Neutrophils Relative %: 75 %
Platelets: 248 10*3/uL (ref 150–400)
RBC: 4.87 MIL/uL (ref 4.22–5.81)
RDW: 14.1 % (ref 11.5–15.5)
WBC: 8.2 10*3/uL (ref 4.0–10.5)
nRBC: 0 % (ref 0.0–0.2)

## 2021-05-08 LAB — COMPREHENSIVE METABOLIC PANEL
ALT: 17 U/L (ref 0–44)
AST: 17 U/L (ref 15–41)
Albumin: 3.7 g/dL (ref 3.5–5.0)
Alkaline Phosphatase: 62 U/L (ref 38–126)
Anion gap: 8 (ref 5–15)
BUN: 14 mg/dL (ref 8–23)
CO2: 26 mmol/L (ref 22–32)
Calcium: 9 mg/dL (ref 8.9–10.3)
Chloride: 108 mmol/L (ref 98–111)
Creatinine, Ser: 1.05 mg/dL (ref 0.61–1.24)
GFR, Estimated: >60 mL/min (ref >60)
Glucose, Bld: 144 mg/dL — ABNORMAL HIGH (ref 70–99)
Potassium: 3.3 mmol/L — ABNORMAL LOW (ref 3.5–5.1)
Sodium: 142 mmol/L (ref 135–145)
Total Bilirubin: 0.8 mg/dL (ref 0.3–1.2)
Total Protein: 7.3 g/dL (ref 6.5–8.1)

## 2021-05-08 LAB — MAGNESIUM: Magnesium: 1.9 mg/dL (ref 1.7–2.4)

## 2021-05-08 LAB — TROPONIN I (HIGH SENSITIVITY): Troponin I (High Sensitivity): 14 ng/L (ref <18)

## 2021-05-08 NOTE — ED Notes (Signed)
Pt did not want to wait any longer. Made the family member aware that he had received a room & pt still wanted to leave. ?

## 2021-05-08 NOTE — ED Provider Triage Note (Signed)
Emergency Medicine Provider Triage Evaluation Note ? ?Thomas [name] , a 71 y.o. male  was evaluated in triage.  Pt complains of feeling like his heart is palpitating/irregular.  Patient states that he wore his oximeter and his heart rate was going anywhere from 60 to as high as 160.  Here his EKG shows a normal sinus rhythm.  He states currently he feels fine.  He does complain of some indigestion that he was having on his way to the ED however denies specific chest pain.  History of A-fib. ? ?Review of Systems  ?Positive: + heart palpitations ?Negative: - SOB, leg swelling ? ?Physical Exam  ?BP (!) 171/87 (BP Location: Left Arm)   Pulse 69   Temp 99.3 ?F (37.4 ?C) (Oral)   Resp 18   SpO2 91%  ?Gen:   Awake, no distress   ?Resp:  Normal effort  ?MSK:   Moves extremities without difficulty  ?Other:   ? ?Medical Decision Making  ?Medically screening exam initiated at 5:53 PM.  Appropriate orders placed.  Thomas [name] was informed that the remainder of the evaluation will be completed by another provider, this initial triage assessment does not replace that evaluation, and the importance of remaining in the ED until their evaluation is complete. ? ? ?  ?Eustaquio Maize, PA-C ?05/08/21 1755 ? ?

## 2021-05-08 NOTE — ED Triage Notes (Signed)
Pt reports feeling irregular heart beat. History of afib. Pt in NSR currently.  ?

## 2021-05-12 ENCOUNTER — Ambulatory Visit: Payer: Medicare HMO | Admitting: Nurse Practitioner

## 2021-05-15 DIAGNOSIS — I2699 Other pulmonary embolism without acute cor pulmonale: Secondary | ICD-10-CM | POA: Diagnosis not present

## 2021-05-15 DIAGNOSIS — J9611 Chronic respiratory failure with hypoxia: Secondary | ICD-10-CM | POA: Diagnosis not present

## 2021-05-19 ENCOUNTER — Ambulatory Visit (INDEPENDENT_AMBULATORY_CARE_PROVIDER_SITE_OTHER): Payer: Medicare HMO | Admitting: Nurse Practitioner

## 2021-05-19 ENCOUNTER — Encounter: Payer: Self-pay | Admitting: Nurse Practitioner

## 2021-05-19 ENCOUNTER — Other Ambulatory Visit (HOSPITAL_COMMUNITY): Payer: Self-pay

## 2021-05-19 VITALS — BP 136/82 | HR 65 | Temp 97.9°F

## 2021-05-19 DIAGNOSIS — M170 Bilateral primary osteoarthritis of knee: Secondary | ICD-10-CM

## 2021-05-19 DIAGNOSIS — E1122 Type 2 diabetes mellitus with diabetic chronic kidney disease: Secondary | ICD-10-CM | POA: Diagnosis not present

## 2021-05-19 DIAGNOSIS — R339 Retention of urine, unspecified: Secondary | ICD-10-CM | POA: Diagnosis not present

## 2021-05-19 DIAGNOSIS — E785 Hyperlipidemia, unspecified: Secondary | ICD-10-CM | POA: Diagnosis not present

## 2021-05-19 DIAGNOSIS — G4730 Sleep apnea, unspecified: Secondary | ICD-10-CM | POA: Diagnosis not present

## 2021-05-19 DIAGNOSIS — I700 Atherosclerosis of aorta: Secondary | ICD-10-CM | POA: Diagnosis not present

## 2021-05-19 DIAGNOSIS — I5022 Chronic systolic (congestive) heart failure: Secondary | ICD-10-CM

## 2021-05-19 DIAGNOSIS — E876 Hypokalemia: Secondary | ICD-10-CM

## 2021-05-19 MED ORDER — TRAMADOL HCL 50 MG PO TABS
50.0000 mg | ORAL_TABLET | Freq: Four times a day (QID) | ORAL | 0 refills | Status: DC | PRN
  Filled 2021-05-19: qty 30, 8d supply, fill #0

## 2021-05-19 NOTE — Progress Notes (Signed)
? ? ?Careteam: ?Patient Care Team: ?[name], NP as PCP - General (Geriatric Medicine) ?Jettie Booze, MD as PCP - Cardiology (Cardiology) ?Debbra Riding, MD as Consulting Physician (Ophthalmology) ? ?PLACE OF SERVICE:  ?[facility]  ?Advanced Directive information ?Does Patient Have a Medical Advance Directive?: Yes, Type of Advance Directive: [place];Living will, Does patient want to make changes to medical advance directive?: No - Patient declined ? ?Allergies  ?Allergen Reactions  ? Lisinopril Cough  ? ? ?Chief Complaint  ?Patient presents with  ? Medical Management of Chronic Issues  ?  4 month follow-up and sign treatment agreement for Tramadol. Discuss need for shingrix, td/tdap, covid booster, and eye exam or post pone if patient refuses. [facility] verified.   ? ? ? ?HPI: Patient is a 71 y.o. male for routine follow up.  ? ?Continues to see urologist every 30 days for catheter changes. He now has a suprapubic, had complications related to this and required hospitalization in December.  ? ?OA of left knee- using tramadol as needed for pain, he is in wheelchair a lot, working towards getting up more. He is working towards being more active and losing weight.  ? ?Daughter is getting married in August.  ? ?DM- check blood sugar at home, reports well controlled no hypoglycemia.  ? ?Needs to have cataract surgery  ? ?Hx of DVT- using xarelto daily, no abnormal bruising or bleeding.  ? ?OSA- recall on cpap, needs to send his in to get a new one.  ? ? ?Review of Systems:  ?Review of Systems  ?Constitutional:  Negative for chills, fever and weight loss.  ?HENT:  Negative for tinnitus.   ?Respiratory:  Negative for cough, sputum production and shortness of breath.   ?Cardiovascular:  Negative for chest pain, palpitations and leg swelling.  ?Gastrointestinal:  Negative for abdominal pain, constipation, diarrhea and heartburn.  ?Genitourinary:   ?     Suprapubic cath   ?Musculoskeletal:  Positive for joint pain and myalgias. Negative for back pain and falls.  ?Skin: Negative.   ?Neurological:  Negative for dizziness and headaches.  ?Psychiatric/Behavioral:  Negative for depression and memory loss. The patient does not have insomnia.   ? ?Past Medical History:  ?Diagnosis Date  ? Acute on chronic diastolic CHF (congestive heart failure) ([place]) 06/01/2017  ? Arthritis   ? Cancer [facility]) 2010  ? Prostate  ? Chronic kidney disease   ? Diabetes mellitus   ? Diabetic neuropathy ([place])   ? feet  ? DVT (deep venous thrombosis) ([place])   ? Genetic testing 06/22/2016  ? Mr. Milliron underwent genetic counseling and testing for hereditary cancer syndromes on 05/14/2016. His results were negative for mutations in all 46 genes analyzed by [facility]'s 46-gene Common Hereditary Cancers Panel. Genes analyzed include: APC, ATM, AXIN2, BARD1, BMPR1A, BRCA1, BRCA2, BRIP1, CDH1, CDKN2A, CHEK2, CTNNA1, DICER1, EPCAM, GREM1, HOXB13, KIT, MEN1, MLH1, MSH2, MSH3, MSH6, MUTYH, NB  ? GERD (gastroesophageal reflux disease)   ? Hypertension   ? PE (pulmonary thromboembolism) ([place])   ? Pneumonia   ? Sleep apnea   ? not wearing CPAP  ? Suprapubic catheter ([place]) 11/17/2019  ? ?Past Surgical History:  ?Procedure Laterality Date  ? CARDIOVERSION N/A 06/03/2017  ? Procedure: CARDIOVERSION;  Surgeon: Jerline Pain, MD;  Location: [facility] ENDOSCOPY;  Service: Cardiovascular;  Laterality: N/A;  ? COLONOSCOPY    ? COLONOSCOPY WITH PROPOFOL N/A 10/20/2018  ? Procedure: COLONOSCOPY WITH PROPOFOL;  Surgeon: Thornton Park, MD;  Location: [facility] ENDOSCOPY;  Service: Gastroenterology;  Laterality: N/A;  ? HERNIA REPAIR    ? KNEE SURGERY    ? MULTIPLE TOOTH EXTRACTIONS    ? POLYPECTOMY  10/20/2018  ? Procedure: POLYPECTOMY;  Surgeon: Thornton Park, MD;  Location: [facility] ENDOSCOPY;  Service: Gastroenterology;;  ? PROSTATE SURGERY    ? RADIOACTIVE SEED IMPLANT    ? RIGHT/LEFT HEART CATH AND CORONARY ANGIOGRAPHY N/A 07/06/2017  ? Procedure:  RIGHT/LEFT HEART CATH AND CORONARY ANGIOGRAPHY;  Surgeon: Jettie Booze, MD;  Location: [place] CV LAB;  Service: Cardiovascular;  Laterality: N/A;  ? SHOULDER SURGERY    ? TOTAL KNEE ARTHROPLASTY Right 11/19/2016  ? Procedure: RIGHT TOTAL KNEE ARTHROPLASTY;  Surgeon: Leandrew Koyanagi, MD;  Location: [place];  Service: Orthopedics;  Laterality: Right;  ? uretha surgery-2014    ? ?Social History: ?  reports that he has never smoked. He has never used smokeless tobacco. He reports that he does not drink alcohol and does not use drugs. ? ?Family History  ?Problem Relation Age of Onset  ? Breast cancer Mother 55  ?     d.89  ? Breast cancer Sister 3  ?     d.30  ? Leukemia Brother 18  ?     d.20  ? Breast cancer Maternal Aunt 17  ?     d.40s  ? Lung cancer Maternal Uncle   ? Prostate cancer Paternal Uncle   ? Prostate cancer Brother   ?     recurred recently at age 82  ? Other Brother 15  ?     spinal tumor  ? Cervical cancer Other 22  ?     d.22  ? Cancer Sister 71  ?     unspecified type  ? Cancer Maternal Uncle 80  ?     unspecified type  ? ? ?Medications: ?Patient's Medications  ?New Prescriptions  ? No medications on file  ?Previous Medications  ? ACCU-CHEK SOFTCLIX LANCETS LANCETS    Use to test blood sugar daily  ? ALCOHOL SWABS (B-D SINGLE USE SWABS REGULAR) PADS    Use in testing blood sugar daily. Dx: E11.22  ? AMLODIPINE (NORVASC) 10 MG TABLET    TAKE 1 TABLET EVERY DAY FOR HIGH BLOOD PRESSURE  ? ASPIRIN EC 81 MG TABLET    Take 81 mg by mouth daily.  ? ATORVASTATIN (LIPITOR) 40 MG TABLET    Take 1 tablet (40 mg total) by mouth daily.  ? BLOOD GLUCOSE CALIBRATION (ACCU-CHEK AVIVA) SOLN    Use once daily as directed dx E11.22  ? CARVEDILOL (COREG) 12.5 MG TABLET    Take 1 tablet (12.5 mg total) by mouth 2 (two) times daily with a meal.  ? EMPAGLIFLOZIN (JARDIANCE) 10 MG TABS TABLET    Take 1 tablet (10 mg total) by mouth daily.  ? FUROSEMIDE (LASIX) 40 MG TABLET    Take 1 tablet (40 mg total) by mouth  daily.  ? GLIPIZIDE (GLUCOTROL) 5 MG TABLET    TAKE 1 TABLET EVERY DAY BEFORE BREAKFAST  ? GLUCOSE BLOOD (ACCU-CHEK AVIVA PLUS) TEST STRIP    Use to test blood sugar daily.  ? HYDRALAZINE (APRESOLINE) 25 MG TABLET    Take 3 tablets (75 mg total) by mouth 3 (three) times daily.  ? LANCETS MISC. (ACCU-CHEK SOFTCLIX LANCET DEV) KIT    Use to test blood sugar daily  ? LOSARTAN (COZAAR) 50 MG TABLET    Take 1 tablet (50 mg total)  by mouth daily.  ? METFORMIN (GLUCOPHAGE) 500 MG TABLET    Take 1 tablet (500 mg total) by mouth 2 (two) times daily with a meal.  ? METHOCARBAMOL (ROBAXIN) 500 MG TABLET    Take 1 tablet (500 mg total) by mouth every 8 (eight) hours as needed for muscle spasms.  ? MULTIPLE VITAMINS-MINERALS (VITAMIN D3 COMPLETE PO)    Take 1 tablet by mouth daily. Gummy  ? POTASSIUM CHLORIDE SA (KLOR-CON) 20 MEQ TABLET    Take 1 tablet (20 mEq total) by mouth 2 (two) times daily.  ? RIVAROXABAN (XARELTO) 20 MG TABS TABLET    Take 1 tablet (20 mg total) by mouth daily with supper.  ? SENNA 8.7 MG CHEW    Chew by mouth as needed.  ? TRAMADOL (ULTRAM) 50 MG TABLET    Take 1 tablet (50 mg total) by mouth every 6 (six) hours as needed for pain  ?Modified Medications  ? No medications on file  ?Discontinued Medications  ? DULAGLUTIDE (TRULICITY) 7.86 LJ/4.4BE SOPN    Inject 0.75 mg into the skin once a week.  ? ? ?Physical Exam: ? ?Vitals:  ? 05/19/21 0959  ?BP: 136/82  ?Pulse: 65  ?Temp: 97.9 ?F (36.6 ?C)  ?TempSrc: Temporal  ?SpO2: 98%  ? ?There is no height or weight on file to calculate BMI. ?Wt Readings from Last 3 Encounters:  ?01/06/21 299 lb (135.6 kg)  ?03/15/20 (!) 320 lb 3.2 oz (145.2 kg)  ?09/15/19 (!) 328 lb 9.6 oz (149.1 kg)  ? ? ?Physical Exam ?Constitutional:   ?   General: He is not in acute distress. ?   Appearance: He is well-developed. He is not diaphoretic.  ?HENT:  ?   Head: Normocephalic and atraumatic.  ?   Right Ear: External ear normal.  ?   Left Ear: External ear normal.  ?   Mouth/Throat:   ?   Pharynx: No oropharyngeal exudate.  ?Eyes:  ?   Conjunctiva/sclera: Conjunctivae normal.  ?   Pupils: Pupils are equal, round, and reactive to light.  ?Cardiovascular:  ?   Rate and Rhythm: Normal rate and regular rhy

## 2021-05-20 ENCOUNTER — Telehealth: Payer: Self-pay

## 2021-05-20 ENCOUNTER — Other Ambulatory Visit (HOSPITAL_COMMUNITY): Payer: Self-pay

## 2021-05-20 DIAGNOSIS — I5022 Chronic systolic (congestive) heart failure: Secondary | ICD-10-CM

## 2021-05-20 LAB — LIPID PANEL
Cholesterol: 113 mg/dL (ref <200)
HDL: 52 mg/dL (ref >=40)
LDL Cholesterol (Calc): 47 mg/dL (calc)
Non-HDL Cholesterol (Calc): 61 mg/dL (calc) (ref <130)
Total CHOL/HDL Ratio: 2.2 (calc) (ref <5.0)
Triglycerides: 65 mg/dL (ref <150)

## 2021-05-20 LAB — BASIC METABOLIC PANEL WITH GFR
BUN: 15 mg/dL (ref 7–25)
CO2: 29 mmol/L (ref 20–32)
Calcium: 8.6 mg/dL (ref 8.6–10.3)
Chloride: 110 mmol/L (ref 98–110)
Creat: 0.99 mg/dL (ref 0.70–1.28)
Glucose, Bld: 158 mg/dL — ABNORMAL HIGH (ref 65–99)
Potassium: 3.8 mmol/L (ref 3.5–5.3)
Sodium: 146 mmol/L (ref 135–146)
eGFR: 82 mL/min/{1.73_m2} (ref >=60)

## 2021-05-20 LAB — HEMOGLOBIN A1C
Hgb A1c MFr Bld: 6.1 % of total Hgb — ABNORMAL HIGH (ref <5.7)
Mean Plasma Glucose: 128 mg/dL
eAG (mmol/L): 7.1 mmol/L

## 2021-05-20 MED ORDER — TRUE METRIX AIR GLUCOSE METER W/DEVICE KIT: 1.0000 | PACK | Freq: Every day | 0 refills | Status: DC | PRN

## 2021-05-20 MED ORDER — POTASSIUM CHLORIDE CRYS ER 20 MEQ PO TBCR: 20.0000 meq | EXTENDED_RELEASE_TABLET | Freq: Two times a day (BID) | ORAL | 1 refills | Status: DC

## 2021-05-20 MED ORDER — TRUEPLUS LANCETS 28G MISC: 1.0000 | Freq: Every day | 1 refills | Status: DC | PRN

## 2021-05-20 MED ORDER — BD SWAB SINGLE USE REGULAR PADS: MEDICATED_PAD | 3 refills | Status: DC

## 2021-05-20 MED ORDER — SENNA 8.7 MG PO CHEW: 1.0000 | CHEWABLE_TABLET | ORAL | 1 refills | Status: AC | PRN

## 2021-05-20 NOTE — Telephone Encounter (Signed)
Refill request received from [facility] order pharmacy. ?

## 2021-05-20 NOTE — Telephone Encounter (Signed)
This encounter was created in error - please disregard.

## 2021-05-22 ENCOUNTER — Other Ambulatory Visit: Payer: Self-pay

## 2021-05-22 ENCOUNTER — Other Ambulatory Visit (HOSPITAL_COMMUNITY): Payer: Self-pay

## 2021-05-22 DIAGNOSIS — I5022 Chronic systolic (congestive) heart failure: Secondary | ICD-10-CM

## 2021-05-22 MED ORDER — POTASSIUM CHLORIDE CRYS ER 20 MEQ PO TBCR
20.0000 meq | EXTENDED_RELEASE_TABLET | Freq: Two times a day (BID) | ORAL | 0 refills | Status: DC
  Filled 2021-05-22: qty 60, 30d supply, fill #0

## 2021-05-22 NOTE — Telephone Encounter (Signed)
Patients spouse called requesting a 30 day supply of potassium to local pharmacy to hold patient until mail order supply received.  ?

## 2021-05-23 ENCOUNTER — Other Ambulatory Visit (HOSPITAL_COMMUNITY): Payer: Self-pay

## 2021-05-27 DIAGNOSIS — J9611 Chronic respiratory failure with hypoxia: Secondary | ICD-10-CM | POA: Diagnosis not present

## 2021-05-27 DIAGNOSIS — I2699 Other pulmonary embolism without acute cor pulmonale: Secondary | ICD-10-CM | POA: Diagnosis not present

## 2021-06-04 DIAGNOSIS — C61 Malignant neoplasm of prostate: Secondary | ICD-10-CM | POA: Diagnosis not present

## 2021-06-04 DIAGNOSIS — Z466 Encounter for fitting and adjustment of urinary device: Secondary | ICD-10-CM | POA: Diagnosis not present

## 2021-06-04 DIAGNOSIS — R338 Other retention of urine: Secondary | ICD-10-CM | POA: Diagnosis not present

## 2021-06-09 ENCOUNTER — Other Ambulatory Visit (HOSPITAL_COMMUNITY): Payer: Self-pay

## 2021-06-09 ENCOUNTER — Other Ambulatory Visit: Payer: Self-pay | Admitting: Nurse Practitioner

## 2021-06-09 DIAGNOSIS — I10 Essential (primary) hypertension: Secondary | ICD-10-CM

## 2021-06-09 MED ORDER — HYDRALAZINE HCL 25 MG PO TABS
75.0000 mg | ORAL_TABLET | Freq: Three times a day (TID) | ORAL | 0 refills | Status: DC
Start: 2021-06-09 — End: 2021-09-18
  Filled 2021-06-09: qty 90, 10d supply, fill #0
  Filled 2021-06-10: qty 270, 30d supply, fill #0

## 2021-06-09 NOTE — Telephone Encounter (Signed)
Patient wife states that patient medication was refilled but medication was sent through mail delivery. She states that medication will not be delivered on time. Requesting 1 month supply. Medication pend and sent to PCP [name], NP . ?

## 2021-06-10 ENCOUNTER — Other Ambulatory Visit (HOSPITAL_COMMUNITY): Payer: Self-pay

## 2021-06-11 ENCOUNTER — Other Ambulatory Visit (HOSPITAL_COMMUNITY): Payer: Self-pay

## 2021-06-15 DIAGNOSIS — J9611 Chronic respiratory failure with hypoxia: Secondary | ICD-10-CM | POA: Diagnosis not present

## 2021-06-15 DIAGNOSIS — I2699 Other pulmonary embolism without acute cor pulmonale: Secondary | ICD-10-CM | POA: Diagnosis not present

## 2021-06-26 ENCOUNTER — Telehealth: Payer: Self-pay

## 2021-06-26 DIAGNOSIS — H25813 Combined forms of age-related cataract, bilateral: Secondary | ICD-10-CM | POA: Diagnosis not present

## 2021-06-26 DIAGNOSIS — H53022 Refractive amblyopia, left eye: Secondary | ICD-10-CM | POA: Diagnosis not present

## 2021-06-26 DIAGNOSIS — E113293 Type 2 diabetes mellitus with mild nonproliferative diabetic retinopathy without macular edema, bilateral: Secondary | ICD-10-CM | POA: Diagnosis not present

## 2021-06-26 DIAGNOSIS — H35033 Hypertensive retinopathy, bilateral: Secondary | ICD-10-CM | POA: Diagnosis not present

## 2021-06-26 NOTE — Telephone Encounter (Signed)
He is taking tramadol as needed for pain ?

## 2021-06-26 NOTE — Telephone Encounter (Signed)
Patient called about his knee pain and would like to know if Dani Gobble would send into the pharmacy Mobic for his pain. Patient was advised that he may need appointment before anything could be send into pharmacy. He states that Janett Billow just saw him last month and know he have problems with his knees. ?

## 2021-06-27 ENCOUNTER — Telehealth: Payer: Self-pay

## 2021-06-27 ENCOUNTER — Encounter: Payer: Self-pay | Admitting: Nurse Practitioner

## 2021-06-27 ENCOUNTER — Telehealth (INDEPENDENT_AMBULATORY_CARE_PROVIDER_SITE_OTHER): Payer: Medicare HMO | Admitting: Nurse Practitioner

## 2021-06-27 ENCOUNTER — Other Ambulatory Visit (HOSPITAL_COMMUNITY): Payer: Self-pay

## 2021-06-27 VITALS — Wt 290.0 lb

## 2021-06-27 DIAGNOSIS — M170 Bilateral primary osteoarthritis of knee: Secondary | ICD-10-CM | POA: Diagnosis not present

## 2021-06-27 MED ORDER — PREDNISONE 10 MG (21) PO TBPK
ORAL_TABLET | ORAL | 0 refills | Status: DC
  Filled 2021-06-27: qty 21, 6d supply, fill #0

## 2021-06-27 NOTE — Progress Notes (Signed)
? ? ?Careteam: ?Patient Care Team: ?[name], NP as PCP - General (Geriatric Medicine) ?Jettie Booze, MD as PCP - Cardiology (Cardiology) ?Debbra Riding, MD as Consulting Physician (Ophthalmology) ? ?Advanced Directive information ?Does Patient Have a Medical Advance Directive?: Yes, Type of Advance Directive: [place];Living will, Does patient want to make changes to medical advance directive?: No - Patient declined ? ?Allergies  ?Allergen Reactions  ? Lisinopril Cough  ? ? ?Chief Complaint  ?Patient presents with  ? Acute Visit  ?  CC knee pain  ? ? ? ?HPI: Patient is a 71 y.o. Thomas [name] via video visit due to knee pain.  ?He has osteoarthritis of the knee. And bang left knee 2 weeks ago and since there is increase in pain and has fluid.  ?His nephew is graduating and he really wants to go.  ?He was at the eye doctor and they mentioned him being on it.  ? ?He is on xarelto due to hx of dvt ? ?A1c has been at goal.  ?Review of Systems:  ?Review of Systems  ?Constitutional:  Negative for chills, fever and weight loss.  ?HENT:  Negative for tinnitus.   ?Respiratory:  Negative for cough, sputum production and shortness of breath.   ?Cardiovascular:  Negative for chest pain, palpitations and leg swelling.  ?Gastrointestinal:  Negative for abdominal pain, constipation, diarrhea and heartburn.  ?Musculoskeletal:  Positive for back pain, joint pain and myalgias. Negative for falls.  ?Skin: Negative.   ?Neurological:  Negative for dizziness and headaches.  ? ?Past Medical History:  ?Diagnosis Date  ? Acute on chronic diastolic CHF (congestive heart failure) ([place]) 06/01/2017  ? Arthritis   ? Cancer [facility]) 2010  ? Prostate  ? Chronic kidney disease   ? Diabetes mellitus   ? Diabetic neuropathy ([place])   ? feet  ? DVT (deep venous thrombosis) ([place])   ? Genetic testing 06/22/2016  ? Mr. Siefert underwent genetic counseling and testing for hereditary cancer syndromes on 05/14/2016. His  results were negative for mutations in all 46 genes analyzed by [facility]'s 46-gene Common Hereditary Cancers Panel. Genes analyzed include: APC, ATM, AXIN2, BARD1, BMPR1A, BRCA1, BRCA2, BRIP1, CDH1, CDKN2A, CHEK2, CTNNA1, DICER1, EPCAM, GREM1, HOXB13, KIT, MEN1, MLH1, MSH2, MSH3, MSH6, MUTYH, NB  ? GERD (gastroesophageal reflux disease)   ? Hypertension   ? PE (pulmonary thromboembolism) ([place])   ? Pneumonia   ? Sleep apnea   ? not wearing CPAP  ? Suprapubic catheter ([place]) 11/17/2019  ? ?Past Surgical History:  ?Procedure Laterality Date  ? CARDIOVERSION N/A 06/03/2017  ? Procedure: CARDIOVERSION;  Surgeon: Jerline Pain, MD;  Location: [facility] ENDOSCOPY;  Service: Cardiovascular;  Laterality: N/A;  ? COLONOSCOPY    ? COLONOSCOPY WITH PROPOFOL N/A 10/20/2018  ? Procedure: COLONOSCOPY WITH PROPOFOL;  Surgeon: Thornton Park, MD;  Location: [facility] ENDOSCOPY;  Service: Gastroenterology;  Laterality: N/A;  ? HERNIA REPAIR    ? KNEE SURGERY    ? MULTIPLE TOOTH EXTRACTIONS    ? POLYPECTOMY  10/20/2018  ? Procedure: POLYPECTOMY;  Surgeon: Thornton Park, MD;  Location: [facility];  Service: Gastroenterology;;  ? PROSTATE SURGERY    ? RADIOACTIVE SEED IMPLANT    ? RIGHT/LEFT HEART CATH AND CORONARY ANGIOGRAPHY N/A 07/06/2017  ? Procedure: RIGHT/LEFT HEART CATH AND CORONARY ANGIOGRAPHY;  Surgeon: Jettie Booze, MD;  Location: [place] CV LAB;  Service: Cardiovascular;  Laterality: N/A;  ? SHOULDER SURGERY    ? TOTAL KNEE ARTHROPLASTY Right 11/19/2016  ?  Procedure: RIGHT TOTAL KNEE ARTHROPLASTY;  Surgeon: Leandrew Koyanagi, MD;  Location: [place];  Service: Orthopedics;  Laterality: Right;  ? uretha surgery-2014    ? ?Social History: ?  reports that he has never smoked. He has never used smokeless tobacco. He reports that he does not drink alcohol and does not use drugs. ? ?Family History  ?Problem Relation Age of Onset  ? Breast cancer Mother 64  ?     d.89  ? Breast cancer Sister 67  ?     d.30  ? Leukemia Brother 18  ?     d.20   ? Breast cancer Maternal Aunt 77  ?     d.40s  ? Lung cancer Maternal Uncle   ? Prostate cancer Paternal Uncle   ? Prostate cancer Brother   ?     recurred recently at age 39  ? Other Brother 49  ?     spinal tumor  ? Cervical cancer Other 22  ?     d.22  ? Cancer Sister 60  ?     unspecified type  ? Cancer Maternal Uncle 80  ?     unspecified type  ? ? ?Medications: ?Patient's Medications  ?New Prescriptions  ? PREDNISONE (STERAPRED UNI-PAK 21 TAB) 10 MG (21) TBPK TABLET    Use as directed  ?Previous Medications  ? ACCU-CHEK SOFTCLIX LANCETS LANCETS    Use to test blood sugar daily  ? ALCOHOL SWABS (B-D SINGLE USE SWABS REGULAR) PADS    Use in testing blood sugar daily. Dx: E11.22  ? AMLODIPINE (NORVASC) 10 MG TABLET    TAKE 1 TABLET EVERY DAY FOR HIGH BLOOD PRESSURE  ? ASPIRIN EC 81 MG TABLET    Take 81 mg by mouth daily.  ? ATORVASTATIN (LIPITOR) 40 MG TABLET    Take 1 tablet (40 mg total) by mouth daily.  ? BLOOD GLUCOSE CALIBRATION (ACCU-CHEK AVIVA) SOLN    Use once daily as directed dx E11.22  ? BLOOD GLUCOSE MONITORING SUPPL (TRUE METRIX AIR GLUCOSE METER) W/DEVICE KIT    1 Device by Does not apply route daily as needed. E11.22  ? CARVEDILOL (COREG) 12.5 MG TABLET    Take 1 tablet (12.5 mg total) by mouth 2 (two) times daily with a meal.  ? EMPAGLIFLOZIN (JARDIANCE) 10 MG TABS TABLET    Take 1 tablet (10 mg total) by mouth daily.  ? FUROSEMIDE (LASIX) 40 MG TABLET    Take 1 tablet (40 mg total) by mouth daily.  ? GLIPIZIDE (GLUCOTROL) 5 MG TABLET    TAKE 1 TABLET EVERY DAY BEFORE BREAKFAST  ? GLUCOSE BLOOD (ACCU-CHEK AVIVA PLUS) TEST STRIP    Use to test blood sugar daily.  ? HYDRALAZINE (APRESOLINE) 25 MG TABLET    Take 3 tablets (75 mg total) by mouth 3 (three) times daily.  ? LANCETS MISC. (ACCU-CHEK SOFTCLIX LANCET DEV) KIT    Use to test blood sugar daily  ? LOSARTAN (COZAAR) 50 MG TABLET    Take 1 tablet (50 mg total) by mouth daily.  ? METFORMIN (GLUCOPHAGE) 500 MG TABLET    Take 1 tablet (500 mg  total) by mouth 2 (two) times daily with a meal.  ? METHOCARBAMOL (ROBAXIN) 500 MG TABLET    Take 1 tablet (500 mg total) by mouth every 8 (eight) hours as needed for muscle spasms.  ? MULTIPLE VITAMINS-MINERALS (VITAMIN D3 COMPLETE PO)    Take 1 tablet by mouth daily. Gummy  ?  POTASSIUM CHLORIDE SA (KLOR-CON M) 20 MEQ TABLET    Take 1 tablet (20 mEq total) by mouth 2 (two) times daily.  ? RIVAROXABAN (XARELTO) 20 MG TABS TABLET    Take 1 tablet (20 mg total) by mouth daily with supper.  ? SENNA 8.7 MG CHEW    Chew 1 tablet by mouth as needed.  ? TRAMADOL (ULTRAM) 50 MG TABLET    Take 1 tablet (50 mg total) by mouth every 6 (six) hours as needed for pain  ? TRUEPLUS LANCETS 28G MISC    1 Device by Does not apply route daily as needed.  ?Modified Medications  ? No medications on file  ?Discontinued Medications  ? No medications on file  ? ? ?Physical Exam: ? ?Vitals:  ? 06/27/21 1340  ?Weight: 290 lb (131.5 kg)  ? ?Body mass index is 42.83 kg/m?. ?Wt Readings from Last 3 Encounters:  ?06/27/21 290 lb (131.5 kg)  ?01/06/21 299 lb (135.6 kg)  ?03/15/20 (!) 320 lb 3.2 oz (145.2 kg)  ? ? ?Physical Exam ?Constitutional:   ?   Appearance: Normal appearance.  ?Neurological:  ?   Mental Status: He is alert. Mental status is at baseline.  ?Psychiatric:     ?   Mood and Affect: Mood normal.  ? ? ?Labs reviewed: ?Basic Metabolic Panel: ?Recent Labs  ?  01/06/21 ?1610 05/08/21 ?1815 05/19/21 ?1029  ?NA 142 142 146  ?K 3.7 3.3* 3.8  ?CL 102 108 110  ?CO2 $Rem'30 26 29  'PXzv$ ?GLUCOSE 149* 144* 158*  ?BUN $Rem'13 14 15  'mRSe$ ?CREATININE 1.13 1.05 0.99  ?CALCIUM 9.5 9.0 8.6  ?MG  --  1.9  --   ? ?Liver Function Tests: ?Recent Labs  ?  10/04/20 ?1013 01/06/21 ?9604 05/08/21 ?1815  ?AST $Rem'13 11 17  'yNLd$ ?ALT $Re'14 12 17  'eWP$ ?ALKPHOS  --   --  59  ?BILITOT 0.9 0.8 0.8  ?PROT 7.4 7.5 7.3  ?ALBUMIN  --   --  3.7  ? ?No results for input(s): LIPASE, AMYLASE in the last 8760 hours. ?No results for input(s): AMMONIA in the last 8760 hours. ?CBC: ?Recent Labs  ?   10/04/20 ?1013 01/06/21 ?5409 05/08/21 ?1815  ?WBC 8.2 8.9 8.2  ?NEUTROABS 6,798 6,168 6.2  ?HGB 14.7 13.3 13.5  ?HCT 45.9 41.1 43.1  ?MCV 82.4 84.0 88.5  ?PLT 331 371 248  ? ?Lipid Panel: ?Recent Labs  ?  01/06/21 ?083

## 2021-06-27 NOTE — Telephone Encounter (Signed)
Mr. Thomas [name], Thomas [name] are scheduled for a virtual visit with your provider today.   ? ?Just as we do with appointments in the office, we must obtain your consent to participate.  Your consent will be active for this visit and any virtual visit you may have with one of our providers in the next 365 days.   ? ?If you have a MyChart account, I can also send a copy of this consent to you electronically.  All virtual visits are billed to your insurance company just like a traditional visit in the office.  As this is a virtual visit, video technology does not allow for your provider to perform a traditional examination.  This may limit your provider's ability to fully assess your condition.  If your provider identifies any concerns that need to be evaluated in person or the need to arrange testing such as labs, EKG, etc, we will make arrangements to do so.   ? ?Although advances in technology are sophisticated, we cannot ensure that it will always work on either your end or our end.  If the connection with a video visit is poor, we may have to switch to a telephone visit.  With either a video or telephone visit, we are not always able to ensure that we have a secure connection.   I need to obtain your verbal consent now.   Are you willing to proceed with your visit today?  ? ?[name] has provided verbal consent on 06/27/2021 for a virtual visit (video or telephone). ? ? ?Debe Coder, CMA ?06/27/2021  1:48 PM ? ? ?

## 2021-06-27 NOTE — Progress Notes (Signed)
This service is provided via telemedicine ? ?No vital signs collected/recorded due to the encounter was a telemedicine visit.  ? ?Location of patient (ex: home, work):  home ? ?Patient consents to a telephone visit:  yes see annual video/telephone consent ? ?Location of the provider (ex: office, home):  [facility] and adult medicine ? ?Name of any referring provider:  N/A ? ?Names of all persons participating in the telemedicine service and their role in the encounter:  Dewaine Oats, Carlos American, NP, [name], [facility], and patient ? ?Time spent on call:  5 min ? ?

## 2021-06-27 NOTE — Telephone Encounter (Signed)
Patient stated that he would like Mobic. Stated that another Dr. Rockey Situ him that Mobic would work better for his knee pain.  ? ?MyChart Appointment scheduled for 06/27/2021 with Janett Billow to discuss.  ?

## 2021-07-02 DIAGNOSIS — R338 Other retention of urine: Secondary | ICD-10-CM | POA: Diagnosis not present

## 2021-07-02 DIAGNOSIS — Z466 Encounter for fitting and adjustment of urinary device: Secondary | ICD-10-CM | POA: Diagnosis not present

## 2021-07-07 ENCOUNTER — Encounter: Payer: Self-pay | Admitting: Nurse Practitioner

## 2021-07-07 ENCOUNTER — Telehealth (INDEPENDENT_AMBULATORY_CARE_PROVIDER_SITE_OTHER): Payer: Medicare HMO | Admitting: Nurse Practitioner

## 2021-07-07 DIAGNOSIS — I5042 Chronic combined systolic (congestive) and diastolic (congestive) heart failure: Secondary | ICD-10-CM | POA: Diagnosis not present

## 2021-07-07 DIAGNOSIS — M170 Bilateral primary osteoarthritis of knee: Secondary | ICD-10-CM

## 2021-07-07 MED ORDER — FUROSEMIDE 40 MG PO TABS: 40.0000 mg | ORAL_TABLET | Freq: Every day | ORAL | 3 refills | Status: DC

## 2021-07-07 MED ORDER — TRAMADOL HCL 50 MG PO TABS: 50.0000 mg | ORAL_TABLET | Freq: Four times a day (QID) | ORAL | 0 refills | Status: DC | PRN

## 2021-07-07 NOTE — Progress Notes (Unsigned)
Careteam: Patient Care Team: [name], NP as PCP - General (Geriatric Medicine) Jettie Booze, MD as PCP - Cardiology (Cardiology) Katy Fitch, Darlina Guys, MD as Consulting Physician (Ophthalmology)  Advanced Directive information Does Patient Have a Medical Advance Directive?: Yes, Type of Advance Directive: [place];Living will, Does patient want to make changes to medical advance directive?: No - Patient declined  Allergies  Allergen Reactions   Lisinopril Cough    Chief Complaint  Patient presents with   Acute Visit    Discuss order for power scooter and refills. Discuss added supplement supplement. Discuss issues with hydralazine      HPI: Patient is a 71 y.o. male for video visit.   He will need refill, children threw away his bag of pill bottles on accident.   Reports his knee is about the same after prednisone- continues to be tender. Unable to drive due to pressure and pain. Trying to strengthen knee more. He reports he can not afford PT at this time.  Children try to help.  Reports he was not happy with orthopedic. Said that he will need surgery on right knee as well.   Reports he wants a prescription for power scooter- states he was told he would need a larger device when looking into this in the past but this would not fit through his doors.  States he is looking at a power scooter/wheelchair from [employer].   He is able to move himself around in a wheelchair in the house. He also has one in the truck.  Having the wheelchair makes it harder for him to get around.   Review of Systems:  Review of Systems  Constitutional:  Negative for chills, fever and weight loss.  HENT:  Negative for tinnitus.   Respiratory:  Negative for cough, sputum production and shortness of breath.   Cardiovascular:  Negative for chest pain, palpitations and leg swelling.  Gastrointestinal:  Negative for abdominal pain, constipation, diarrhea and  heartburn.  Genitourinary:  Negative for dysuria, frequency and urgency.  Musculoskeletal:  Positive for joint pain and myalgias. Negative for back pain and falls.  Skin: Negative.   Neurological:  Negative for dizziness and headaches.  Psychiatric/Behavioral:  Negative for depression and memory loss. The patient does not have insomnia.    Past Medical History:  Diagnosis Date   Acute on chronic diastolic CHF (congestive heart failure) ([place]) 06/01/2017   Arthritis    Cancer ([place]) 2010   Prostate   Chronic kidney disease    Diabetes mellitus    Diabetic neuropathy ([place])    feet   DVT (deep venous thrombosis) ([place])    Genetic testing 06/22/2016   Mr. Morken underwent genetic counseling and testing for hereditary cancer syndromes on 05/14/2016. His results were negative for mutations in all 46 genes analyzed by [facility]'s 46-gene Common Hereditary Cancers Panel. Genes analyzed include: APC, ATM, AXIN2, BARD1, BMPR1A, BRCA1, BRCA2, BRIP1, CDH1, CDKN2A, CHEK2, CTNNA1, DICER1, EPCAM, GREM1, HOXB13, KIT, MEN1, MLH1, MSH2, MSH3, MSH6, MUTYH, NB   GERD (gastroesophageal reflux disease)    Hypertension    PE (pulmonary thromboembolism) (HCC)    Pneumonia    Sleep apnea    not wearing CPAP   Suprapubic catheter ([place]) 11/17/2019   Past Surgical History:  Procedure Laterality Date   CARDIOVERSION N/A 06/03/2017   Procedure: CARDIOVERSION;  Surgeon: Jerline Pain, MD;  Location: [facility];  Service: Cardiovascular;  Laterality: N/A;   COLONOSCOPY  COLONOSCOPY WITH PROPOFOL N/A 10/20/2018   Procedure: COLONOSCOPY WITH PROPOFOL;  Surgeon: Thornton Park, MD;  Location: [facility];  Service: Gastroenterology;  Laterality: N/A;   HERNIA REPAIR     KNEE SURGERY     MULTIPLE TOOTH EXTRACTIONS     POLYPECTOMY  10/20/2018   Procedure: POLYPECTOMY;  Surgeon: Thornton Park, MD;  Location: [facility];  Service: Gastroenterology;;   PROSTATE SURGERY     RADIOACTIVE SEED IMPLANT      RIGHT/LEFT HEART CATH AND CORONARY ANGIOGRAPHY N/A 07/06/2017   Procedure: RIGHT/LEFT HEART CATH AND CORONARY ANGIOGRAPHY;  Surgeon: Jettie Booze, MD;  Location: [facility];  Service: Cardiovascular;  Laterality: N/A;   SHOULDER SURGERY     TOTAL KNEE ARTHROPLASTY Right 11/19/2016   Procedure: RIGHT TOTAL KNEE ARTHROPLASTY;  Surgeon: Leandrew Koyanagi, MD;  Location: [facility];  Service: Orthopedics;  Laterality: Right;   [place] surgery-2014     Social History:   reports that he has never smoked. He has never used smokeless tobacco. He reports that he does not drink alcohol and does not use drugs.  Family History  Problem Relation Age of Onset   Breast cancer Mother 76       d.48   Breast cancer Sister 30       d.30   Leukemia Brother 18       d.20   Breast cancer Maternal Aunt 40       d.73s   Lung cancer Maternal Uncle    Prostate cancer Paternal Uncle    Prostate cancer Brother        recurred recently at age 65   Other Brother 78       spinal tumor   Cervical cancer Other 75       d.22   Cancer Sister 26       unspecified type   Cancer Maternal Uncle 38       unspecified type    Medications: Patient's Medications  New Prescriptions   No medications on file  Previous Medications   ACCU-CHEK SOFTCLIX LANCETS LANCETS    Use to test blood sugar daily   ALCOHOL SWABS (B-D SINGLE USE SWABS REGULAR) PADS    Use in testing blood sugar daily. Dx: E11.22   AMLODIPINE (NORVASC) 10 MG TABLET    TAKE 1 TABLET EVERY DAY FOR HIGH BLOOD PRESSURE   APPLE CIDER VINEGAR PO    Take 1 tablet by mouth 3 (three) times daily. Brand: Goli, gummy   ASHWAGANDHA PO    Take 1 tablet by mouth in the morning and at bedtime. Brand: Goli, gummy   ASPIRIN EC 81 MG TABLET    Take 81 mg by mouth daily.   ATORVASTATIN (LIPITOR) 40 MG TABLET    Take 1 tablet (40 mg total) by mouth daily.   BLOOD GLUCOSE CALIBRATION (ACCU-CHEK AVIVA) SOLN    Use once daily as directed dx E11.22   BLOOD GLUCOSE  MONITORING SUPPL (TRUE METRIX AIR GLUCOSE METER) W/DEVICE KIT    1 Device by Does not apply route daily as needed. E11.22   CARVEDILOL (COREG) 12.5 MG TABLET    Take 1 tablet (12.5 mg total) by mouth 2 (two) times daily with a meal.   EMPAGLIFLOZIN (JARDIANCE) 10 MG TABS TABLET    Take 1 tablet (10 mg total) by mouth daily.   FUROSEMIDE (LASIX) 40 MG TABLET    Take 1 tablet (40 mg total) by mouth daily.   GLIPIZIDE (GLUCOTROL) 5  MG TABLET    TAKE 1 TABLET EVERY DAY BEFORE BREAKFAST   GLUCOSE BLOOD (ACCU-CHEK AVIVA PLUS) TEST STRIP    Use to test blood sugar daily.   HYDRALAZINE (APRESOLINE) 25 MG TABLET    Take 3 tablets (75 mg total) by mouth 3 (three) times daily.   LANCETS MISC. (ACCU-CHEK SOFTCLIX LANCET DEV) KIT    Use to test blood sugar daily   LOSARTAN (COZAAR) 50 MG TABLET    Take 1 tablet (50 mg total) by mouth daily.   METFORMIN (GLUCOPHAGE) 500 MG TABLET    Take 1 tablet (500 mg total) by mouth 2 (two) times daily with a meal.   METHOCARBAMOL (ROBAXIN) 500 MG TABLET    Take 1 tablet (500 mg total) by mouth every 8 (eight) hours as needed for muscle spasms.   MISC NATURAL PRODUCTS (SUPER GREENS PO)    Take 1 tablet by mouth 2 (two) times daily. Brand: Goli, gummy   MULTIPLE VITAMINS-MINERALS (VITAMIN D3 COMPLETE PO)    Take 1 tablet by mouth daily. Gummy   POTASSIUM CHLORIDE SA (KLOR-CON M) 20 MEQ TABLET    Take 1 tablet (20 mEq total) by mouth 2 (two) times daily.   RIVAROXABAN (XARELTO) 20 MG TABS TABLET    Take 1 tablet (20 mg total) by mouth daily with supper.   SENNA 8.7 MG CHEW    Chew 1 tablet by mouth as needed.   TRAMADOL (ULTRAM) 50 MG TABLET    Take 1 tablet (50 mg total) by mouth every 6 (six) hours as needed for pain   TRUEPLUS LANCETS 28G MISC    1 Device by Does not apply route daily as needed.   UNABLE TO FIND    Take 1 tablet by mouth daily. Med Name: Cinnachroma  Modified Medications   No medications on file  Discontinued Medications   PREDNISONE (STERAPRED UNI-PAK  21 TAB) 10 MG (21) TBPK TABLET    Use as directed    Physical Exam:  There were no vitals filed for this visit. There is no height or weight on file to calculate BMI. Wt Readings from Last 3 Encounters:  06/27/21 290 lb (131.5 kg)  01/06/21 299 lb (135.6 kg)  03/15/20 (!) 320 lb 3.2 oz (145.2 kg)    Physical Exam Constitutional:      Appearance: Normal appearance.  Neurological:     Mental Status: He is alert. Mental status is at baseline.  Psychiatric:        Mood and Affect: Mood normal.    Labs reviewed: Basic Metabolic Panel: Recent Labs    01/06/21 0834 05/08/21 1815 05/19/21 1029  NA 142 142 146  K 3.7 3.3* 3.8  CL 102 108 110  CO2 $Re'30 26 29  'mFh$ GLUCOSE 149* 144* 158*  BUN $Re'13 14 15  'GXG$ CREATININE 1.13 1.05 0.99  CALCIUM 9.5 9.0 8.6  MG  --  1.9  --    Liver Function Tests: Recent Labs    10/04/20 1013 01/06/21 0834 05/08/21 1815  AST $Re'13 11 17  'deZ$ ALT $R'14 12 17  'wG$ ALKPHOS  --   --  62  BILITOT 0.9 0.8 0.8  PROT 7.4 7.5 7.3  ALBUMIN  --   --  3.7   No results for input(s): LIPASE, AMYLASE in the last 8760 hours. No results for input(s): AMMONIA in the last 8760 hours. CBC: Recent Labs    10/04/20 1013 01/06/21 0834 05/08/21 1815  WBC 8.2 8.9 8.2  NEUTROABS 6,798 6,168  6.2  HGB 14.7 13.3 13.5  HCT 45.9 41.1 43.1  MCV 82.4 84.0 88.5  PLT 331 371 248   Lipid Panel: Recent Labs    01/06/21 0834 05/19/21 1029  CHOL 211* 113  HDL 40 52  LDLCALC 145* 47  TRIG 134 65  CHOLHDL 5.3* 2.2   TSH: No results for input(s): TSH in the last 8760 hours. A1C: Lab Results  Component Value Date   HGBA1C 6.1 (H) 05/19/2021     Assessment/Plan 1. Chronic combined systolic and diastolic congestive heart failure (HCC) -refill provided, symptoms stable.  - furosemide (LASIX) 40 MG tablet; Take 1 tablet (40 mg total) by mouth daily.  Dispense: 90 tablet; Refill: 3  2. Primary osteoarthritis of both knees -ongoing OA, worsening pain in left knee, he is s/p  right knee replacement - traMADol (ULTRAM) 50 MG tablet; Take 1 tablet (50 mg total) by mouth every 6 (six) hours as needed for pain  Dispense: 30 tablet; Refill: 0 - DME Wheelchair electric - Ambulatory referral to Orthopedic Surgery for further evaluation and treatment Dorothia Passmore K. Harle Battiest  [facility] [phone]    Virtual Visit via video  I connected with patient on 07/07/21 at 10:00 AM EDT by mychart video and verified that I am speaking with the correct person using two identifiers.  Location: Patient: home Provider: psc   I discussed the limitations, risks, security and privacy concerns of performing an evaluation and management service by telephone and the availability of in person appointments. I also discussed with the patient that there may be a patient responsible charge related to this service. The patient expressed understanding and agreed to proceed.   I discussed the assessment and treatment plan with the patient. The patient was provided an opportunity to ask questions and all were answered. The patient agreed with the plan and demonstrated an understanding of the instructions.   The patient was advised to call back or seek an in-person evaluation if the symptoms worsen or if the condition fails to improve as anticipated.  I provided 16 minutes of non-face-to-face time during this encounter.  Carlos American. Harle Battiest Avs printed and mailed

## 2021-07-07 NOTE — Progress Notes (Unsigned)
   This service is provided via telemedicine  No vital signs collected/recorded due to the encounter was a telemedicine visit.   Location of patient (ex: home, work):  Home  Patient consents to a telephone visit: Yes, see telephone visit dated 10/24/2020  Location of the provider (ex: office, home):  [facility], Office   Name of any referring provider:  N/A  Names of all persons participating in the telemedicine service and their role in the encounter:  S.Chrae B/CMA, Sherrie Mustache, NP, Pamala Hurry (spouse), and Patient   Time spent on call:  7 min with medical assistant

## 2021-07-08 ENCOUNTER — Other Ambulatory Visit: Payer: Self-pay | Admitting: Nurse Practitioner

## 2021-07-08 ENCOUNTER — Other Ambulatory Visit (HOSPITAL_COMMUNITY): Payer: Self-pay

## 2021-07-08 DIAGNOSIS — H25812 Combined forms of age-related cataract, left eye: Secondary | ICD-10-CM | POA: Diagnosis not present

## 2021-07-08 DIAGNOSIS — H25813 Combined forms of age-related cataract, bilateral: Secondary | ICD-10-CM | POA: Diagnosis not present

## 2021-07-08 DIAGNOSIS — M170 Bilateral primary osteoarthritis of knee: Secondary | ICD-10-CM

## 2021-07-08 NOTE — Telephone Encounter (Signed)
Rx start date, 5/22.23. 30/0 refills. Treatment agreement on file, 05/19/21. Upcoming appointment on (10/30/21).   Please advise.

## 2021-07-15 DIAGNOSIS — J9611 Chronic respiratory failure with hypoxia: Secondary | ICD-10-CM | POA: Diagnosis not present

## 2021-07-15 DIAGNOSIS — I2699 Other pulmonary embolism without acute cor pulmonale: Secondary | ICD-10-CM | POA: Diagnosis not present

## 2021-07-18 ENCOUNTER — Other Ambulatory Visit: Payer: Self-pay | Admitting: Nurse Practitioner

## 2021-07-18 DIAGNOSIS — I10 Essential (primary) hypertension: Secondary | ICD-10-CM

## 2021-07-23 ENCOUNTER — Telehealth: Payer: Self-pay | Admitting: Nurse Practitioner

## 2021-07-23 NOTE — Telephone Encounter (Signed)
Mardene Celeste at [facility]  requesting last face-to-face office visit (with pt present in his chair) faxed to [phone] for power chair.

## 2021-07-29 DIAGNOSIS — H25812 Combined forms of age-related cataract, left eye: Secondary | ICD-10-CM | POA: Diagnosis not present

## 2021-07-29 DIAGNOSIS — H269 Unspecified cataract: Secondary | ICD-10-CM | POA: Diagnosis not present

## 2021-07-31 HISTORY — PX: CATARACT EXTRACTION: SUR2

## 2021-08-08 NOTE — Telephone Encounter (Signed)
Received PowerWheelChair Form from [employer] [phone] requesting Face to Face Office Visit Note Requirements for Power Wheelchair.   Per Shanda Bumps patient needs an appointment in office to have filled out.   Called and scheduled an appointment for patient 08/18/2021 Placed paperwork in [place] under "3"  To be faxed back along with Supporting OV note to Fax: [phone]

## 2021-08-15 DIAGNOSIS — J9611 Chronic respiratory failure with hypoxia: Secondary | ICD-10-CM | POA: Diagnosis not present

## 2021-08-15 DIAGNOSIS — I2699 Other pulmonary embolism without acute cor pulmonale: Secondary | ICD-10-CM | POA: Diagnosis not present

## 2021-08-18 ENCOUNTER — Ambulatory Visit (INDEPENDENT_AMBULATORY_CARE_PROVIDER_SITE_OTHER): Payer: Medicare HMO | Admitting: Nurse Practitioner

## 2021-08-18 ENCOUNTER — Other Ambulatory Visit (HOSPITAL_COMMUNITY): Payer: Self-pay

## 2021-08-18 ENCOUNTER — Encounter: Payer: Self-pay | Admitting: Nurse Practitioner

## 2021-08-18 VITALS — BP 136/80 | HR 59 | Temp 97.3°F | Ht 69.0 in | Wt 321.0 lb

## 2021-08-18 DIAGNOSIS — E1122 Type 2 diabetes mellitus with diabetic chronic kidney disease: Secondary | ICD-10-CM | POA: Diagnosis not present

## 2021-08-18 DIAGNOSIS — M170 Bilateral primary osteoarthritis of knee: Secondary | ICD-10-CM

## 2021-08-18 DIAGNOSIS — I2699 Other pulmonary embolism without acute cor pulmonale: Secondary | ICD-10-CM

## 2021-08-18 DIAGNOSIS — R269 Unspecified abnormalities of gait and mobility: Secondary | ICD-10-CM

## 2021-08-18 MED ORDER — EMPAGLIFLOZIN 10 MG PO TABS
10.0000 mg | ORAL_TABLET | Freq: Every day | ORAL | 3 refills | Status: DC
  Filled 2021-08-18: qty 90, 90d supply, fill #0
  Filled 2021-08-18: qty 30, 30d supply, fill #0

## 2021-08-18 MED ORDER — RIVAROXABAN 20 MG PO TABS
20.0000 mg | ORAL_TABLET | Freq: Every day | ORAL | 12 refills | Status: DC
  Filled 2021-08-18: qty 30, 30d supply, fill #0

## 2021-08-18 MED ORDER — METFORMIN HCL 500 MG PO TABS
500.0000 mg | ORAL_TABLET | Freq: Two times a day (BID) | ORAL | 1 refills | Status: DC
  Filled 2021-08-18: qty 180, 90d supply, fill #0

## 2021-08-18 NOTE — Progress Notes (Signed)
Careteam: Patient Care Team: Sharon Seller, NP as PCP - General (Geriatric Medicine) Corky Crafts, MD as PCP - Cardiology (Cardiology) Dione Booze, Bertram Millard, MD as Consulting Physician (Ophthalmology)  PLACE OF SERVICE:  [facility]  Advanced Directive information    Allergies  Allergen Reactions   Lisinopril Cough    Chief Complaint  Patient presents with   Form Completion    Power wheelchair form completion for [employer]. Off Metformin x 1 month due to leg cramps, no improvement since D/c' ing. Off Xarelto x 1 month, patient decided on his own. Here with spouse.      HPI: Patient is a 71 y.o. male for mobility assessment.   Reports he request power wheelchair so he can get around better. He has a hard time carrying the wheelchair in and out of the car.  He has a wheelchair lift for his car for a power wheelchair  Reports it would be easiler to get around.   He is using [facility] wheelchair at time of visit   He has a wide wheelchair in the care but not able to lift heavy due to cataract surgery.  He is able to move his legs to self propel.  He is also able to move the wheels with his arms.   Reports his thumbs do get sore when self propelling the wheel chair.  Pain in thumbs 3/4 out of 10.  He has used cold and hot water to treat this.  He also has hx of rotator cuff tear so increase use of arms/shoulders causes pain.   Reports he would be able to get around the house more to cook and clean with power chair.   Reports his legs have become weak and therefore unable to get around the house.  Legs are constantly throbbing.  Reports pain is from knee to ankle.  Pain is a 5/10. Constant pain and described as trembling. Taking muscle relaxer, mineral rice and tramadol without relief.  Not currently having pain but most of the time.   Uses a walking stick but only for short distances. Family has gotten scooter but unsure if this will be effective and reports  this is too big to be able to get around the home.   Reports he has done his own physical therapy. Reports expensive when he has physical therapy orders.  Does not feel like additional therapy will help him  Reports he feels weak when walking any length. Has a hard time straightening out his legs.  He has followed with orthopedic in the past and they reported there was not much else they could do.  He is seeing another orthopedic for a second option.   Pt is alert and oriented, able to follow commands and directions.   Review of Systems:  Review of Systems  Constitutional:  Negative for chills, fever and weight loss.  HENT:  Negative for tinnitus.   Respiratory:  Negative for cough, sputum production and shortness of breath.   Cardiovascular:  Negative for chest pain, palpitations and leg swelling.  Musculoskeletal:  Positive for joint pain and myalgias. Negative for back pain and falls.  Skin: Negative.   Neurological:  Negative for dizziness and headaches.  Psychiatric/Behavioral:  Negative for depression and memory loss.     Past Medical History:  Diagnosis Date   Acute on chronic diastolic CHF (congestive heart failure) (HCC) 06/01/2017   Arthritis    Cancer (HCC) 2010   Prostate   Chronic kidney  disease    Diabetes mellitus    Diabetic neuropathy ([place])    feet   DVT (deep venous thrombosis) ([place])    Genetic testing 06/22/2016   Mr. Bowlby underwent genetic counseling and testing for hereditary cancer syndromes on 05/14/2016. His results were negative for mutations in all 46 genes analyzed by [facility]'s 46-gene Common Hereditary Cancers Panel. Genes analyzed include: APC, ATM, AXIN2, BARD1, BMPR1A, BRCA1, BRCA2, BRIP1, CDH1, CDKN2A, CHEK2, CTNNA1, DICER1, EPCAM, GREM1, HOXB13, KIT, MEN1, MLH1, MSH2, MSH3, MSH6, MUTYH, NB   GERD (gastroesophageal reflux disease)    Hypertension    PE (pulmonary thromboembolism) (HCC)    Pneumonia    Sleep apnea    not wearing CPAP    Suprapubic catheter ([place]) 11/17/2019   Past Surgical History:  Procedure Laterality Date   CARDIOVERSION N/A 06/03/2017   Procedure: CARDIOVERSION;  Surgeon: Jerline Pain, MD;  Location: [place] [facility];  Service: Cardiovascular;  Laterality: N/A;   CATARACT EXTRACTION Left 07/31/2021   Dr.Glenn, [place], [place]   COLONOSCOPY     COLONOSCOPY WITH PROPOFOL N/A 10/20/2018   Procedure: COLONOSCOPY WITH PROPOFOL;  Surgeon: Thornton Park, MD;  [facility]: [facility];  Service: Gastroenterology;  Laterality: N/A;   HERNIA REPAIR     KNEE SURGERY     MULTIPLE TOOTH EXTRACTIONS     POLYPECTOMY  10/20/2018   Procedure: POLYPECTOMY;  Surgeon: Thornton Park, MD;  Location: [facility];  Service: Gastroenterology;;   PROSTATE SURGERY     RADIOACTIVE SEED IMPLANT     RIGHT/LEFT HEART CATH AND CORONARY ANGIOGRAPHY N/A 07/06/2017   Procedure: RIGHT/LEFT HEART CATH AND CORONARY ANGIOGRAPHY;  Surgeon: Jettie Booze, MD;  Location: [place] [facility];  Service: Cardiovascular;  Laterality: N/A;   SHOULDER SURGERY     TOTAL KNEE ARTHROPLASTY Right 11/19/2016   Procedure: RIGHT TOTAL KNEE ARTHROPLASTY;  Surgeon: Leandrew Koyanagi, MD;  Location: [place];  Service: Orthopedics;  Laterality: Right;   uretha surgery-2014     Social History:   reports that he has never smoked. He has never used smokeless tobacco. He reports that he does not drink alcohol and does not use drugs.  Family History  Problem Relation Age of Onset   Breast cancer Mother 36       d.57   Breast cancer Sister 30       d.30   Leukemia Brother 18       d.20   Breast cancer Maternal Aunt 40       d.103s   Lung cancer Maternal Uncle    Prostate cancer Paternal Uncle    Prostate cancer Brother        recurred recently at age 62   Other Brother 52       spinal tumor   Cervical cancer Other 60       d.22   Cancer Sister 66       unspecified type   Cancer Maternal Uncle 42       unspecified type     Medications: Patient's Medications  New Prescriptions   No medications on file  Previous Medications   ACCU-CHEK SOFTCLIX LANCETS LANCETS    Use to test blood sugar daily   ALCOHOL SWABS (B-D SINGLE USE SWABS REGULAR) PADS    Use in testing blood sugar daily. Dx: E11.22   AMLODIPINE (NORVASC) 10 MG TABLET    TAKE 1 TABLET EVERY DAY FOR HIGH BLOOD PRESSURE   APPLE CIDER VINEGAR PO    Take  1 tablet by mouth 3 (three) times daily. Brand: Goli, gummy   ASHWAGANDHA PO    Take 1 tablet by mouth in the morning and at bedtime. Brand: Goli, gummy   ASPIRIN EC 81 MG TABLET    Take 81 mg by mouth daily.   ATORVASTATIN (LIPITOR) 40 MG TABLET    Take 1 tablet (40 mg total) by mouth daily.   BLOOD GLUCOSE CALIBRATION (ACCU-CHEK AVIVA) SOLN    Use once daily as directed dx E11.22   BLOOD GLUCOSE MONITORING SUPPL (TRUE METRIX AIR GLUCOSE METER) W/DEVICE KIT    1 Device by Does not apply route daily as needed. E11.22   CARVEDILOL (COREG) 12.5 MG TABLET    Take 1 tablet (12.5 mg total) by mouth 2 (two) times daily with a meal.   EMPAGLIFLOZIN (JARDIANCE) 10 MG TABS TABLET    Take 1 tablet (10 mg total) by mouth daily.   FUROSEMIDE (LASIX) 40 MG TABLET    Take 1 tablet (40 mg total) by mouth daily.   GLIPIZIDE (GLUCOTROL) 5 MG TABLET    TAKE 1 TABLET EVERY DAY BEFORE BREAKFAST   GLUCOSE BLOOD (ACCU-CHEK AVIVA PLUS) TEST STRIP    Use to test blood sugar daily.   HYDRALAZINE (APRESOLINE) 25 MG TABLET    Take 3 tablets (75 mg total) by mouth 3 (three) times daily.   LANCETS MISC. (ACCU-CHEK SOFTCLIX LANCET DEV) KIT    Use to test blood sugar daily   LOSARTAN (COZAAR) 50 MG TABLET    Take 1 tablet (50 mg total) by mouth daily.   METFORMIN (GLUCOPHAGE) 500 MG TABLET    Take 1 tablet (500 mg total) by mouth 2 (two) times daily with a meal.   METHOCARBAMOL (ROBAXIN) 500 MG TABLET    Take 1 tablet (500 mg total) by mouth every 8 (eight) hours as needed for muscle spasms.   MISC NATURAL PRODUCTS (SUPER GREENS PO)     Take 1 tablet by mouth 2 (two) times daily. Brand: Goli, gummy   POTASSIUM CHLORIDE SA (KLOR-CON M) 20 MEQ TABLET    Take 1 tablet (20 mEq total) by mouth 2 (two) times daily.   RIVAROXABAN (XARELTO) 20 MG TABS TABLET    Take 1 tablet (20 mg total) by mouth daily with supper.   SENNA 8.7 MG CHEW    Chew 1 tablet by mouth as needed.   TRAMADOL (ULTRAM) 50 MG TABLET    Take 1 tablet (50 mg total) by mouth every 6 (six) hours as needed for pain   TRUEPLUS LANCETS 28G MISC    1 Device by Does not apply route daily as needed.   UNABLE TO FIND    Take 1 tablet by mouth daily. Med Name: Cinnachroma   UNABLE TO FIND    Med Name: Goli Beet supplement 1 by mouth 2 times daily  Modified Medications   No medications on file  Discontinued Medications   MULTIPLE VITAMINS-MINERALS (VITAMIN D3 COMPLETE PO)    Take 1 tablet by mouth daily. Gummy    Physical Exam:  Vitals:   08/18/21 0914  BP: 136/80  Pulse: (!) 59  Temp: (!) 97.3 F (36.3 C)  TempSrc: Temporal  SpO2: 92%  Weight: (!) 321 lb (145.6 kg)  Height: $Remove'5\' 9"'VSXVNja$  (1.753 m)   Body mass index is 47.4 kg/m. Wt Readings from Last 3 Encounters:  08/18/21 (!) 321 lb (145.6 kg)  06/27/21 290 lb (131.5 kg)  01/06/21 299 lb (135.6 kg)  Physical Exam Constitutional:      General: He is not in acute distress.    Appearance: He is well-developed. He is not diaphoretic.  HENT:     Head: Normocephalic and atraumatic.     Right Ear: External ear normal.     Left Ear: External ear normal.     Mouth/Throat:     Pharynx: No oropharyngeal exudate.  Eyes:     Conjunctiva/sclera: Conjunctivae normal.     Pupils: Pupils are equal, round, and reactive to light.  Cardiovascular:     Rate and Rhythm: Normal rate and regular rhythm.     Heart sounds: Normal heart sounds.  Pulmonary:     Effort: Pulmonary effort is normal.     Breath sounds: Normal breath sounds.  Abdominal:     General: Bowel sounds are normal.     Palpations: Abdomen is soft.   Musculoskeletal:     Right shoulder: Normal.     Left shoulder: Normal.     Right upper arm: Normal.     Left upper arm: Normal.     Right wrist: Normal.     Left wrist: Normal.     Right hand: Normal.     Left hand: Normal.     Cervical back: Normal range of motion and neck supple.     Right knee: Deformity present. Decreased range of motion. Tenderness present.     Left knee: Deformity present. Decreased range of motion. Tenderness present.     Right lower leg: No edema.     Left lower leg: No edema.  Skin:    General: Skin is warm and dry.  Neurological:     Mental Status: He is alert and oriented to person, place, and time.     Motor: No weakness.     Coordination: Coordination normal.     Gait: Gait abnormal.     Labs reviewed: Basic Metabolic Panel: Recent Labs    01/06/21 0834 05/08/21 1815 05/19/21 1029  NA 142 142 146  K 3.7 3.3* 3.8  CL 102 108 110  CO2 $Re'30 26 29  'bQl$ GLUCOSE 149* 144* 158*  BUN $Re'13 14 15  'ZjT$ CREATININE 1.13 1.05 0.99  CALCIUM 9.5 9.0 8.6  MG  --  1.9  --    Liver Function Tests: Recent Labs    10/04/20 1013 01/06/21 0834 05/08/21 1815  AST $Re'13 11 17  'RuM$ ALT $R'14 12 17  'Gk$ ALKPHOS  --   --  62  BILITOT 0.9 0.8 0.8  PROT 7.4 7.5 7.3  ALBUMIN  --   --  3.7   No results for input(s): "LIPASE", "AMYLASE" in the last 8760 hours. No results for input(s): "AMMONIA" in the last 8760 hours. CBC: Recent Labs    10/04/20 1013 01/06/21 0834 05/08/21 1815  WBC 8.2 8.9 8.2  NEUTROABS 6,798 6,168 6.2  HGB 14.7 13.3 13.5  HCT 45.9 41.1 43.1  MCV 82.4 84.0 88.5  PLT 331 371 248   Lipid Panel: Recent Labs    01/06/21 0834 05/19/21 1029  CHOL 211* 113  HDL 40 52  LDLCALC 145* 47  TRIG 134 65  CHOLHDL 5.3* 2.2   TSH: No results for input(s): "TSH" in the last 8760 hours. A1C: Lab Results  Component Value Date   HGBA1C 6.1 (H) 05/19/2021     Assessment/Plan 1. Primary osteoarthritis of both knees - Ambulatory referral to Physical  Therapy to help with mobility and gait  2. Abnormal gait -he is unable to  straighten bilateral legs at the knee making it very uncomfortable to walk and gait very unsteady. ROM at knee ~ 100-120 degrees. He has 5/5 strength to upper and lower extremities but due to gait unable to ambulate for any distance.   He reports use of wheelchair is helpful but self propelling over a long period of time causing discomfort to his hands. He is very motivated and has mental capacity to use power wheelchair.  - Ambulatory referral to Physical Therapy  3. Type 2 diabetes mellitus with chronic kidney disease, without long-term current use of insulin, unspecified CKD stage ([place]) -discussed importance of medication compliance, refill provided - empagliflozin (JARDIANCE) 10 MG TABS tablet; Take 1 tablet (10 mg total) by mouth daily.  Dispense: 90 tablet; Refill: 3 - metFORMIN (GLUCOPHAGE) 500 MG tablet; Take 1 tablet (500 mg total) by mouth 2 (two) times daily with a meal.  Dispense: 180 tablet; Refill: 1  4. Other pulmonary embolism without acute cor pulmonale, unspecified chronicity ([place]) Discussed importance of medication compliance, refill provided - rivaroxaban (XARELTO) 20 MG TABS tablet; Take 1 tablet (20 mg total) by mouth daily with supper.  Dispense: 30 tablet; Refill: 12 TABS tablet; Take 1 tablet (20 mg total) by mouth daily with supper.  Dispense: 30 tablet; Refill: 12   Return in about 4 weeks (around 09/15/2021) for routine follow up . Thomas [name]. [place], [facility] [phone]

## 2021-08-26 ENCOUNTER — Other Ambulatory Visit (HOSPITAL_COMMUNITY): Payer: Self-pay

## 2021-08-28 ENCOUNTER — Other Ambulatory Visit (HOSPITAL_COMMUNITY): Payer: Self-pay

## 2021-08-28 ENCOUNTER — Other Ambulatory Visit: Payer: Self-pay | Admitting: Nurse Practitioner

## 2021-08-28 DIAGNOSIS — M170 Bilateral primary osteoarthritis of knee: Secondary | ICD-10-CM

## 2021-08-28 NOTE — Telephone Encounter (Signed)
Rx last refill (07/07/21), 30/0 refill. Treatment agreement on file dated, 05/19/21.   Upcoming appointment on (09/29/21).   Message routed to covering provider Bard Herbert, NP.   Please advise.

## 2021-08-29 ENCOUNTER — Other Ambulatory Visit (HOSPITAL_COMMUNITY): Payer: Self-pay

## 2021-08-29 MED ORDER — TRAMADOL HCL 50 MG PO TABS
50.0000 mg | ORAL_TABLET | Freq: Four times a day (QID) | ORAL | 0 refills | Status: DC | PRN
  Filled 2021-08-29: qty 30, 8d supply, fill #0

## 2021-09-03 DIAGNOSIS — R339 Retention of urine, unspecified: Secondary | ICD-10-CM | POA: Diagnosis not present

## 2021-09-03 DIAGNOSIS — Z466 Encounter for fitting and adjustment of urinary device: Secondary | ICD-10-CM | POA: Diagnosis not present

## 2021-09-04 ENCOUNTER — Telehealth: Payer: Medicare HMO

## 2021-09-04 NOTE — Telephone Encounter (Signed)
Order had previously been given and they were awaiting the note, paperwork was signed and note was completed.

## 2021-09-04 NOTE — Telephone Encounter (Signed)
Patient called and complains that he was seen in office 08/18/2021. Patient states that he hasn't heard anything back about his wheelchair. He states that insurance company states they haven't received anything about an order for wheelchair. Patient wants to know the status of wheelchair. Message routed to PCP Dewaine Oats, Carlos American, NP

## 2021-09-04 NOTE — Telephone Encounter (Signed)
Called patient and let him know that I attempted to call [employer] and placed on hold for 10+min. I let patient know that I went ahead and ref-axed papers to number provided on paper work. Patient also given phone number to contact [place] [phone] as well. Patient made aware to call back our office if he has anymore delays with company.

## 2021-09-04 NOTE — Telephone Encounter (Signed)
Called patient back and he states that this was not what he was told by [employer]. Patient given office fax number [phone] and notified to have insurance company to fax [place] as well.

## 2021-09-11 DIAGNOSIS — Z96651 Presence of right artificial knee joint: Secondary | ICD-10-CM | POA: Diagnosis not present

## 2021-09-11 DIAGNOSIS — M25562 Pain in left knee: Secondary | ICD-10-CM | POA: Diagnosis not present

## 2021-09-11 DIAGNOSIS — R339 Retention of urine, unspecified: Secondary | ICD-10-CM | POA: Diagnosis not present

## 2021-09-11 DIAGNOSIS — M25561 Pain in right knee: Secondary | ICD-10-CM | POA: Diagnosis not present

## 2021-09-11 DIAGNOSIS — M1712 Unilateral primary osteoarthritis, left knee: Secondary | ICD-10-CM | POA: Diagnosis not present

## 2021-09-12 ENCOUNTER — Other Ambulatory Visit: Payer: Self-pay | Admitting: Nurse Practitioner

## 2021-09-12 DIAGNOSIS — I5042 Chronic combined systolic (congestive) and diastolic (congestive) heart failure: Secondary | ICD-10-CM

## 2021-09-12 DIAGNOSIS — E1122 Type 2 diabetes mellitus with diabetic chronic kidney disease: Secondary | ICD-10-CM

## 2021-09-12 DIAGNOSIS — I509 Heart failure, unspecified: Secondary | ICD-10-CM

## 2021-09-14 DIAGNOSIS — J9611 Chronic respiratory failure with hypoxia: Secondary | ICD-10-CM | POA: Diagnosis not present

## 2021-09-14 DIAGNOSIS — I2699 Other pulmonary embolism without acute cor pulmonale: Secondary | ICD-10-CM | POA: Diagnosis not present

## 2021-09-15 ENCOUNTER — Ambulatory Visit: Payer: Medicare HMO

## 2021-09-18 ENCOUNTER — Other Ambulatory Visit: Payer: Self-pay | Admitting: Nurse Practitioner

## 2021-09-18 DIAGNOSIS — I10 Essential (primary) hypertension: Secondary | ICD-10-CM

## 2021-09-29 ENCOUNTER — Ambulatory Visit (INDEPENDENT_AMBULATORY_CARE_PROVIDER_SITE_OTHER): Payer: Medicare HMO | Admitting: Nurse Practitioner

## 2021-09-29 ENCOUNTER — Encounter: Payer: Self-pay | Admitting: Nurse Practitioner

## 2021-09-29 ENCOUNTER — Telehealth: Payer: Self-pay

## 2021-09-29 ENCOUNTER — Other Ambulatory Visit (HOSPITAL_COMMUNITY): Payer: Self-pay

## 2021-09-29 VITALS — BP 146/90 | HR 57 | Temp 97.3°F

## 2021-09-29 DIAGNOSIS — I5042 Chronic combined systolic (congestive) and diastolic (congestive) heart failure: Secondary | ICD-10-CM | POA: Diagnosis not present

## 2021-09-29 DIAGNOSIS — M170 Bilateral primary osteoarthritis of knee: Secondary | ICD-10-CM

## 2021-09-29 DIAGNOSIS — I480 Paroxysmal atrial fibrillation: Secondary | ICD-10-CM | POA: Diagnosis not present

## 2021-09-29 DIAGNOSIS — I2699 Other pulmonary embolism without acute cor pulmonale: Secondary | ICD-10-CM

## 2021-09-29 DIAGNOSIS — M1991 Primary osteoarthritis, unspecified site: Secondary | ICD-10-CM

## 2021-09-29 DIAGNOSIS — G8929 Other chronic pain: Secondary | ICD-10-CM

## 2021-09-29 DIAGNOSIS — E1122 Type 2 diabetes mellitus with diabetic chronic kidney disease: Secondary | ICD-10-CM

## 2021-09-29 DIAGNOSIS — I10 Essential (primary) hypertension: Secondary | ICD-10-CM | POA: Diagnosis not present

## 2021-09-29 DIAGNOSIS — M545 Low back pain, unspecified: Secondary | ICD-10-CM | POA: Diagnosis not present

## 2021-09-29 DIAGNOSIS — R339 Retention of urine, unspecified: Secondary | ICD-10-CM | POA: Diagnosis not present

## 2021-09-29 MED ORDER — METHOCARBAMOL 500 MG PO TABS
500.0000 mg | ORAL_TABLET | Freq: Three times a day (TID) | ORAL | 5 refills | Status: DC | PRN
  Filled 2021-09-29: qty 30, 10d supply, fill #0
  Filled 2021-12-02: qty 30, 10d supply, fill #1

## 2021-09-29 MED ORDER — TRAMADOL HCL 50 MG PO TABS
50.0000 mg | ORAL_TABLET | Freq: Four times a day (QID) | ORAL | 0 refills | Status: DC | PRN
  Filled 2021-09-29: qty 30, 8d supply, fill #0

## 2021-09-29 NOTE — Progress Notes (Signed)
Careteam: Patient Care Team: [name], NP as PCP - General (Geriatric Medicine) Jettie Booze, MD as PCP - Cardiology (Cardiology) Katy Fitch, Darlina Guys, MD as Consulting Physician (Ophthalmology)  PLACE OF SERVICE:  [place]  Advanced Directive information    Allergies  Allergen Reactions  . Lisinopril Cough    Chief Complaint  Patient presents with  . Follow-up    6 week follow-up      HPI: Patient is a 71 y.o. male for routine follow up.   He was seen 6 weeks ago for wheelchair evaluation and paperwork submitted but have not heard from company.  Diabetes- reports he has been enjoying himself over the last 2 week and blood sugars elevated.  No low blood sugars. He had stopped taking medication but now taking jardiance, metformin and glipizide.   OA of bilateral knees- went to see Dr Raphael Gibney and reports he wants to do surgery in September  Decrease ROM. Taking robaxin and tramadol   Does not check blood pressure at home. Sightly elevated today.    Review of Systems:  ROS***  Past Medical History:  Diagnosis Date  . Acute on chronic diastolic CHF (congestive heart failure) ([place]) 06/01/2017  . Arthritis   . Cancer [facility]) 2010   Prostate  . Chronic kidney disease   . Diabetes mellitus   . Diabetic neuropathy (HCC)    feet  . DVT (deep venous thrombosis) ([place])   . Genetic testing 06/22/2016   Mr. Trebilcock underwent genetic counseling and testing for hereditary cancer syndromes on 05/14/2016. His results were negative for mutations in all 46 genes analyzed by [facility]'s 46-gene Common Hereditary Cancers Panel. Genes analyzed include: APC, ATM, AXIN2, BARD1, BMPR1A, BRCA1, BRCA2, BRIP1, CDH1, CDKN2A, CHEK2, CTNNA1, DICER1, EPCAM, GREM1, HOXB13, KIT, MEN1, MLH1, MSH2, MSH3, MSH6, MUTYH, NB  . GERD (gastroesophageal reflux disease)   . Hypertension   . PE (pulmonary thromboembolism) ([place])   . Pneumonia   . Sleep apnea    not wearing CPAP  .  Suprapubic catheter ([place]) 11/17/2019   Past Surgical History:  Procedure Laterality Date  . CARDIOVERSION N/A 06/03/2017   Procedure: CARDIOVERSION;  Surgeon: Jerline Pain, MD;  Location: [facility];  Service: Cardiovascular;  Laterality: N/A;  . CATARACT EXTRACTION Left 07/31/2021   Dr.Glenn, [place], [facility]  . COLONOSCOPY    . COLONOSCOPY WITH PROPOFOL N/A 10/20/2018   Procedure: COLONOSCOPY WITH PROPOFOL;  Surgeon: Thornton Park, MD;  [facility];  Service: Gastroenterology;  Laterality: N/A;  . HERNIA REPAIR    . KNEE SURGERY    . MULTIPLE TOOTH EXTRACTIONS    . POLYPECTOMY  10/20/2018   Procedure: POLYPECTOMY;  Surgeon: Thornton Park, MD;  [facility];  Service: Gastroenterology;;  . PROSTATE SURGERY    . RADIOACTIVE SEED IMPLANT    . RIGHT/LEFT HEART CATH AND CORONARY ANGIOGRAPHY N/A 07/06/2017   Procedure: RIGHT/LEFT HEART CATH AND CORONARY ANGIOGRAPHY;  Surgeon: Jettie Booze, MD;  Location: [place] [facility];  Service: Cardiovascular;  Laterality: N/A;  . SHOULDER SURGERY    . TOTAL KNEE ARTHROPLASTY Right 11/19/2016   Procedure: RIGHT TOTAL KNEE ARTHROPLASTY;  Surgeon: Leandrew Koyanagi, MD;  Location: [place];  Service: Orthopedics;  Laterality: Right;  . uretha surgery-2014     Social History:   reports that he has never smoked. He has never used smokeless tobacco. He reports that he does not drink alcohol and does not use drugs.  Family History  Problem Relation Age of Onset  . Breast cancer Mother 72       d.89  . Breast cancer Sister 18       d.30  . Leukemia Brother 18       d.20  . Breast cancer Maternal Aunt 40       d.40s  . Lung cancer Maternal Uncle   . Prostate cancer Paternal Uncle   . Prostate cancer Brother        recurred recently at age 86  . Other Brother 15       spinal tumor  . Cervical cancer Other 22       d.22  . Cancer Sister 45       unspecified type  . Cancer Maternal Uncle 80        unspecified type    Medications: Patient's Medications  New Prescriptions   No medications on file  Previous Medications   ACCU-CHEK SOFTCLIX LANCETS LANCETS    Use to test blood sugar daily   ALCOHOL SWABS (B-D SINGLE USE SWABS REGULAR) PADS    Use in testing blood sugar daily. Dx: E11.22   AMLODIPINE (NORVASC) 10 MG TABLET    TAKE 1 TABLET EVERY DAY FOR HIGH BLOOD PRESSURE   APPLE CIDER VINEGAR PO    Take 1 tablet by mouth 3 (three) times daily. Brand: Goli, gummy   ASHWAGANDHA PO    Take 1 tablet by mouth in the morning and at bedtime. Brand: Goli, gummy   ASPIRIN EC 81 MG TABLET    Take 81 mg by mouth daily.   ATORVASTATIN (LIPITOR) 40 MG TABLET    Take 1 tablet (40 mg total) by mouth daily.   BLOOD GLUCOSE CALIBRATION (ACCU-CHEK AVIVA) SOLN    Use once daily as directed dx E11.22   BLOOD GLUCOSE MONITORING SUPPL (TRUE METRIX AIR GLUCOSE METER) W/DEVICE KIT    1 Device by Does not apply route daily as needed. E11.22   CARVEDILOL (COREG) 12.5 MG TABLET    TAKE 1 TABLET TWICE DAILY WITH MEALS   EMPAGLIFLOZIN (JARDIANCE) 10 MG TABS TABLET    Take 1 tablet (10 mg total) by mouth daily.   FUROSEMIDE (LASIX) 40 MG TABLET    TAKE 1 TABLET (40 MG TOTAL) BY MOUTH DAILY.   GLIPIZIDE (GLUCOTROL) 5 MG TABLET    TAKE 1 TABLET EVERY DAY BEFORE BREAKFAST   GLUCOSE BLOOD (ACCU-CHEK AVIVA PLUS) TEST STRIP    Use to test blood sugar daily.   HYDRALAZINE (APRESOLINE) 25 MG TABLET    TAKE 3 TABLETS ($RemoveBe'75MG'jWbGKEDAa$ ) THREE TIMES DAILY   LANCETS MISC. (ACCU-CHEK SOFTCLIX LANCET DEV) KIT    Use to test blood sugar daily   LOSARTAN (COZAAR) 50 MG TABLET    Take 1 tablet (50 mg total) by mouth daily.   METFORMIN (GLUCOPHAGE) 500 MG TABLET    TAKE 1 TABLET TWICE DAILY WITH MEALS   METHOCARBAMOL (ROBAXIN) 500 MG TABLET    Take 1 tablet (500 mg total) by mouth every 8 (eight) hours as needed for muscle spasms.   MISC NATURAL PRODUCTS (SUPER GREENS PO)    Take 1 tablet by mouth 2 (two) times daily. Brand: Goli, gummy    POTASSIUM CHLORIDE SA (KLOR-CON M) 20 MEQ TABLET    Take 1 tablet (20 mEq total) by mouth 2 (two) times daily.   RIVAROXABAN (XARELTO) 20 MG TABS TABLET    Take 1 tablet (20 mg total) by mouth daily with supper.   SENNA  8.7 MG CHEW    Chew 1 tablet by mouth as needed.   TRAMADOL (ULTRAM) 50 MG TABLET    Take 1 tablet (50 mg total) by mouth every 6 (six) hours as needed for pain   TRUEPLUS LANCETS 28G MISC    1 Device by Does not apply route daily as needed.   UNABLE TO FIND    Take 1 tablet by mouth daily. Med Name: Cinnachroma   UNABLE TO FIND    Med Name: Goli Beet supplement 1 by mouth 2 times daily  Modified Medications   No medications on file  Discontinued Medications   TRAMADOL (ULTRAM) 50 MG TABLET    Take 1 tablet (50 mg total) by mouth every 6 (six) hours as needed for pain    Physical Exam:  Vitals:   09/29/21 0951  BP: (!) 146/90  Pulse: (!) 57  Temp: (!) 97.3 F (36.3 C)  TempSrc: Temporal  SpO2: 94%   There is no height or weight on file to calculate BMI. Wt Readings from Last 3 Encounters:  08/18/21 (!) 321 lb (145.6 kg)  06/27/21 290 lb (131.5 kg)  01/06/21 299 lb (135.6 kg)    Physical Exam***  Labs reviewed: Basic Metabolic Panel: Recent Labs    01/06/21 0834 05/08/21 1815 05/19/21 1029  NA 142 142 146  K 3.7 3.3* 3.8  CL 102 108 110  CO2 $Re'30 26 29  'ejV$ GLUCOSE 149* 144* 158*  BUN $Re'13 14 15  'aob$ CREATININE 1.13 1.05 0.99  CALCIUM 9.5 9.0 8.6  MG  --  1.9  --    Liver Function Tests:- Recent Labs    10/04/20 1013 01/06/21 0834 05/08/21 1815  AST $Re'13 11 17  'uZE$ ALT $R'14 12 17  'Gh$ ALKPHOS  --   --  62  BILITOT 0.9 0.8 0.8  PROT 7.4 7.5 7.3  ALBUMIN  --   --  3.7   No results for input(s): "LIPASE", "AMYLASE" in the last 8760 hours. No results for input(s): "AMMONIA" in the last 8760 hours. CBC: Recent Labs    10/04/20 1013 01/06/21 0834 05/08/21 1815  WBC 8.2 8.9 8.2  NEUTROABS 6,798 6,168 6.2  HGB 14.7 13.3 13.5  HCT 45.9 41.1 43.1  MCV 82.4 84.0  88.5  PLT 331 371 248   Lipid Panel: Recent Labs    01/06/21 0834 05/19/21 1029  CHOL 211* 113  HDL 40 52  LDLCALC 145* 47  TRIG 134 65  CHOLHDL 5.3* 2.2   TSH: No results for input(s): "TSH" in the last 8760 hours. A1C: Lab Results  Component Value Date   HGBA1C 6.1 (H) 05/19/2021     Assessment/Plan 1. Acute bilateral low back pain without sciatica ***  2. Primary osteoarthritis of both knees ***   No follow-ups on file.: *** Paighton Godette K. [place], [facility] [phone]

## 2021-09-29 NOTE — Telephone Encounter (Signed)
Patient was in office today for a follow-up visit and inquired about the status of wheelchair paperwork.  Per patient someone came out to their home, took some measurements, stated they would send a form to our office for completion and they have not heard anything since and that was a couple of weeks ago.  I located form from [place] (see media) that was signed and scanned on 09/12/21. I assume form was faxed, however it is not stamped faxed.   I reprinted form and faxed to [employer] @ [phone], patient informed of actions taken

## 2021-09-30 ENCOUNTER — Other Ambulatory Visit (HOSPITAL_COMMUNITY): Payer: Self-pay

## 2021-09-30 LAB — COMPLETE METABOLIC PANEL WITH GFR
AG Ratio: 1.3 (calc) (ref 1.0–2.5)
ALT: 13 U/L (ref 9–46)
AST: 8 U/L — ABNORMAL LOW (ref 10–35)
Albumin: 3.9 g/dL (ref 3.6–5.1)
Alkaline phosphatase (APISO): 71 U/L (ref 35–144)
BUN: 20 mg/dL (ref 7–25)
CO2: 23 mmol/L (ref 20–32)
Calcium: 8.8 mg/dL (ref 8.6–10.3)
Chloride: 108 mmol/L (ref 98–110)
Creat: 1.16 mg/dL (ref 0.70–1.28)
Globulin: 2.9 g/dL (calc) (ref 1.9–3.7)
Glucose, Bld: 202 mg/dL — ABNORMAL HIGH (ref 65–99)
Potassium: 3.4 mmol/L — ABNORMAL LOW (ref 3.5–5.3)
Sodium: 144 mmol/L (ref 135–146)
Total Bilirubin: 0.7 mg/dL (ref 0.2–1.2)
Total Protein: 6.8 g/dL (ref 6.1–8.1)
eGFR: 67 mL/min/{1.73_m2} (ref >=60)

## 2021-09-30 LAB — CBC WITH DIFFERENTIAL/PLATELET
Absolute Monocytes: 401 cells/uL (ref 200–950)
Basophils Absolute: 41 cells/uL (ref 0–200)
Basophils Relative: 0.7 %
Eosinophils Absolute: 130 cells/uL (ref 15–500)
Eosinophils Relative: 2.2 %
HCT: 41.8 % (ref 38.5–50.0)
Hemoglobin: 13.6 g/dL (ref 13.2–17.1)
Lymphs Abs: 956 cells/uL (ref 850–3900)
MCH: 28.3 pg (ref 27.0–33.0)
MCHC: 32.5 g/dL (ref 32.0–36.0)
MCV: 86.9 fL (ref 80.0–100.0)
MPV: 10 fL (ref 7.5–12.5)
Monocytes Relative: 6.8 %
Neutro Abs: 4372 cells/uL (ref 1500–7800)
Neutrophils Relative %: 74.1 %
Platelets: 254 10*3/uL (ref 140–400)
RBC: 4.81 10*6/uL (ref 4.20–5.80)
RDW: 13.2 % (ref 11.0–15.0)
Total Lymphocyte: 16.2 %
WBC: 5.9 10*3/uL (ref 3.8–10.8)

## 2021-09-30 LAB — HEMOGLOBIN A1C
Hgb A1c MFr Bld: 7.2 % of total Hgb — ABNORMAL HIGH (ref <5.7)
Mean Plasma Glucose: 160 mg/dL
eAG (mmol/L): 8.9 mmol/L

## 2021-10-08 DIAGNOSIS — R339 Retention of urine, unspecified: Secondary | ICD-10-CM | POA: Diagnosis not present

## 2021-10-08 DIAGNOSIS — Z435 Encounter for attention to cystostomy: Secondary | ICD-10-CM | POA: Diagnosis not present

## 2021-10-13 DIAGNOSIS — R339 Retention of urine, unspecified: Secondary | ICD-10-CM | POA: Diagnosis not present

## 2021-10-18 ENCOUNTER — Other Ambulatory Visit: Payer: Self-pay | Admitting: Nurse Practitioner

## 2021-10-18 DIAGNOSIS — I5022 Chronic systolic (congestive) heart failure: Secondary | ICD-10-CM

## 2021-10-18 DIAGNOSIS — E1122 Type 2 diabetes mellitus with diabetic chronic kidney disease: Secondary | ICD-10-CM

## 2021-10-18 DIAGNOSIS — I10 Essential (primary) hypertension: Secondary | ICD-10-CM

## 2021-10-30 ENCOUNTER — Encounter: Payer: Self-pay | Admitting: Nurse Practitioner

## 2021-10-30 ENCOUNTER — Telehealth: Payer: Self-pay

## 2021-10-30 ENCOUNTER — Ambulatory Visit (INDEPENDENT_AMBULATORY_CARE_PROVIDER_SITE_OTHER): Payer: Medicare HMO | Admitting: Nurse Practitioner

## 2021-10-30 DIAGNOSIS — Z0000 Encounter for general adult medical examination without abnormal findings: Secondary | ICD-10-CM

## 2021-10-30 NOTE — Patient Instructions (Signed)
Thomas [name] , Thank you for taking time to come for your Medicare Wellness Visit. I appreciate your ongoing commitment to your health goals. Please review the following plan we discussed and let me know if I can assist you in the future.   Screening recommendations/referrals: Colonoscopy up to date Recommended yearly ophthalmology/optometry visit for glaucoma screening and checkup Recommended yearly dental visit for hygiene and checkup  Vaccinations: Influenza vaccine due annually in September/October Pneumococcal vaccine up to date Tdap vaccine DUE- recommend to get at your local pharmacy Shingles vaccine DUE- recommend to get at your local pharmacy    Advanced directives: recommended to complete.   Conditions/risks identified: morbid obesity, deconditioning and weakness, advanced age, diabetes, hypertension.   Next appointment: yearly   Preventive Care 71 Years and Older, Male Preventive care refers to lifestyle choices and visits with your health care provider that can promote health and wellness. What does preventive care include? A yearly physical exam. This is also called an annual well check. Dental exams once or twice a year. Routine eye exams. Ask your health care provider how often you should have your eyes checked. Personal lifestyle choices, including: Daily care of your teeth and gums. Regular physical activity. Eating a healthy diet. Avoiding tobacco and drug use. Limiting alcohol use. Practicing safe sex. Taking low doses of aspirin every day. Taking vitamin and mineral supplements as recommended by your health care provider. What happens during an annual well check? The services and screenings done by your health care provider during your annual well check will depend on your age, overall health, lifestyle risk factors, and family history of disease. Counseling  Your health care provider may ask you questions about your: Alcohol use. Tobacco use. Drug  use. Emotional well-being. Home and relationship well-being. Sexual activity. Eating habits. History of falls. Memory and ability to understand (cognition). Work and work Statistician. Screening  You may have the following tests or measurements: Height, weight, and BMI. Blood pressure. Lipid and cholesterol levels. These may be checked every 5 years, or more frequently if you are over 1 years old. Skin check. Lung cancer screening. You may have this screening every year starting at age 71 if you have a 30-pack-year history of smoking and currently smoke or have quit within the past 15 years. Fecal occult blood test (FOBT) of the stool. You may have this test every year starting at age 71. Flexible sigmoidoscopy or colonoscopy. You may have a sigmoidoscopy every 5 years or a colonoscopy every 10 years starting at age 71. Prostate cancer screening. Recommendations will vary depending on your family history and other risks. Hepatitis C blood test. Hepatitis B blood test. Sexually transmitted disease (STD) testing. Diabetes screening. This is done by checking your blood sugar (glucose) after you have not eaten for a while (fasting). You may have this done every 1-3 years. Abdominal aortic aneurysm (AAA) screening. You may need this if you are a current or former smoker. Osteoporosis. You may be screened starting at age 43 if you are at high risk. Talk with your health care provider about your test results, treatment options, and if necessary, the need for more tests. Vaccines  Your health care provider may recommend certain vaccines, such as: Influenza vaccine. This is recommended every year. Tetanus, diphtheria, and acellular pertussis (Tdap, Td) vaccine. You may need a Td booster every 10 years. Zoster vaccine. You may need this after age 71. Pneumococcal 13-valent conjugate (PCV13) vaccine. One dose is recommended after age 6. Pneumococcal  polysaccharide (PPSV23) vaccine. One dose is  recommended after age 50. Talk to your health care provider about which screenings and vaccines you need and how often you need them. This information is not intended to replace advice given to you by your health care provider. Make sure you discuss any questions you have with your health care provider. Document Released: 03/01/2015 Document Revised: 10/23/2015 Document Reviewed: 12/04/2014 Elsevier Interactive Patient Education  2017 [place] Prevention in the Home Falls can cause injuries. They can happen to people of all ages. There are many things you can do to make your home safe and to help prevent falls. What can I do on the outside of my home? Regularly fix the edges of walkways and driveways and fix any cracks. Remove anything that might make you trip as you walk through a door, such as a raised step or threshold. Trim any bushes or trees on the path to your home. Use bright outdoor lighting. Clear any walking paths of anything that might make someone trip, such as rocks or tools. Regularly check to see if handrails are loose or broken. Make sure that both sides of any steps have handrails. Any raised decks and porches should have guardrails on the edges. Have any leaves, snow, or ice cleared regularly. Use sand or salt on walking paths during winter. Clean up any spills in your garage right away. This includes oil or grease spills. What can I do in the bathroom? Use night lights. Install grab bars by the toilet and in the tub and shower. Do not use towel bars as grab bars. Use non-skid mats or decals in the tub or shower. If you need to sit down in the shower, use a plastic, non-slip stool. Keep the floor dry. Clean up any water that spills on the floor as soon as it happens. Remove soap buildup in the tub or shower regularly. Attach bath mats securely with double-sided non-slip rug tape. Do not have throw rugs and other things on the floor that can make you  trip. What can I do in the bedroom? Use night lights. Make sure that you have a light by your bed that is easy to reach. Do not use any sheets or blankets that are too big for your bed. They should not hang down onto the floor. Have a firm chair that has side arms. You can use this for support while you get dressed. Do not have throw rugs and other things on the floor that can make you trip. What can I do in the kitchen? Clean up any spills right away. Avoid walking on wet floors. Keep items that you use a lot in easy-to-reach places. If you need to reach something above you, use a strong step stool that has a grab bar. Keep electrical cords out of the way. Do not use floor polish or wax that makes floors slippery. If you must use wax, use non-skid floor wax. Do not have throw rugs and other things on the floor that can make you trip. What can I do with my stairs? Do not leave any items on the stairs. Make sure that there are handrails on both sides of the stairs and use them. Fix handrails that are broken or loose. Make sure that handrails are as long as the stairways. Check any carpeting to make sure that it is firmly attached to the stairs. Fix any carpet that is loose or worn. Avoid having throw rugs at the top  or bottom of the stairs. If you do have throw rugs, attach them to the floor with carpet tape. Make sure that you have a light switch at the top of the stairs and the bottom of the stairs. If you do not have them, ask someone to add them for you. What else can I do to help prevent falls? Wear shoes that: Do not have high heels. Have rubber bottoms. Are comfortable and fit you well. Are closed at the toe. Do not wear sandals. If you use a stepladder: Make sure that it is fully opened. Do not climb a closed stepladder. Make sure that both sides of the stepladder are locked into place. Ask someone to hold it for you, if possible. Clearly mark and make sure that you can  see: Any grab bars or handrails. First and last steps. Where the edge of each step is. Use tools that help you move around (mobility aids) if they are needed. These include: Canes. Walkers. Scooters. Crutches. Turn on the lights when you go into a dark area. Replace any light bulbs as soon as they burn out. Set up your furniture so you have a clear path. Avoid moving your furniture around. If any of your floors are uneven, fix them. If there are any pets around you, be aware of where they are. Review your medicines with your doctor. Some medicines can make you feel dizzy. This can increase your chance of falling. Ask your doctor what other things that you can do to help prevent falls. This information is not intended to replace advice given to you by your health care provider. Make sure you discuss any questions you have with your health care provider. Document Released: 11/29/2008 Document Revised: 07/11/2015 Document Reviewed: 03/09/2014 Elsevier Interactive Patient Education  2017 [employer].

## 2021-10-30 NOTE — Progress Notes (Signed)
   This service is provided via telemedicine  No vital signs collected/recorded due to the encounter was a telemedicine visit.   Location of patient (ex: home, work):  Home  Patient consents to a telephone visit: Yes, see telephone visit dated 10/30/21  Location of the provider (ex: office, home):  [place], Remote Location   Name of any referring provider:  N/A  Names of all persons participating in the telemedicine service and their role in the encounter:  S.Chrae B/CMA, Sherrie Mustache, NP, and Patient   Time spent on call:  12 min with medical assistant

## 2021-10-30 NOTE — Telephone Encounter (Signed)
I was on the phone with patient to complete his annual wellness visit and he inquired about a wheelchair order request that began in July 2023.  Patient states he tried to contact [employer] and was unable to obtain any information regarding the status of his order.  I called [employer] at [phone] and spoke with [facility]. Patrice states the power wheelchair is out of stock.  Sharl Ma states that she will send a message to her team member, Jackelyn Poling to contact [employer]. Once they hear from the manufacture someone will follow-up with the patient.   I shared this information with [name], NP who was working at a remote location [facility]) and she plans on sharing with patient when she calls him back to do the second part of his annual wellness visit.

## 2021-10-30 NOTE — Progress Notes (Signed)
Subjective:   Thomas [name] is a 71 y.o. male who presents for Medicare Annual/Subsequent preventive examination.  Review of Systems     Cardiac Risk Factors include: diabetes mellitus;dyslipidemia;hypertension;advanced age (>30men, >81 women);male gender;obesity (BMI >30kg/m2);sedentary lifestyle     Objective:    Today's Vitals   10/30/21 1113  PainSc: 3    There is no height or weight on file to calculate BMI.     10/30/2021   10:21 AM 09/29/2021    9:59 AM 07/07/2021    9:48 AM 06/27/2021    1:30 PM 05/19/2021    9:59 AM 05/08/2021    5:45 PM 01/06/2021    8:06 AM  Advanced Directives  Does Patient Have a Medical Advance Directive? No Yes Yes Yes Yes No No  Type of Scientist, physiological of [place];Living will [place];Living will [place];Living will    Does patient want to make changes to medical advance directive? Yes (MAU/Ambulatory/Procedural Areas - Information given) Yes (MAU/Ambulatory/Procedural Areas - Information given) No - Patient declined No - Patient declined No - Patient declined    Copy of [place] in Chart?   No - copy requested No - copy requested No - copy requested    Would patient like information on creating a medical advance directive?       No - Patient declined    Current Medications (verified) Outpatient Encounter Medications as of 10/30/2021  Medication Sig   Accu-Chek Softclix Lancets lancets Use to test blood sugar daily   Alcohol Swabs (B-D SINGLE USE SWABS REGULAR) PADS Use in testing blood sugar daily. Dx: E11.22   amLODipine (NORVASC) 10 MG tablet TAKE 1 TABLET EVERY DAY FOR HIGH BLOOD PRESSURE   ASHWAGANDHA PO Take 1 tablet by mouth in the morning and at bedtime. Brand: Goli, gummy   aspirin EC 81 MG tablet Take 81 mg by mouth daily.   atorvastatin (LIPITOR) 40 MG tablet Take 1 tablet (40 mg total) by mouth daily.   Blood Glucose Calibration (ACCU-CHEK AVIVA)  SOLN Use once daily as directed dx E11.22   Blood Glucose Monitoring Suppl (TRUE METRIX AIR GLUCOSE METER) w/Device KIT 1 Device by Does not apply route daily as needed. E11.22   carvedilol (COREG) 12.5 MG tablet TAKE 1 TABLET TWICE DAILY WITH MEALS   empagliflozin (JARDIANCE) 10 MG TABS tablet Take 1 tablet (10 mg total) by mouth daily.   furosemide (LASIX) 40 MG tablet TAKE 1 TABLET (40 MG TOTAL) BY MOUTH DAILY.   glipiZIDE (GLUCOTROL) 5 MG tablet TAKE 1 TABLET EVERY DAY BEFORE BREAKFAST   glucose blood (ACCU-CHEK AVIVA PLUS) test strip Use to test blood sugar daily.   hydrALAZINE (APRESOLINE) 25 MG tablet TAKE 3 TABLETS ($RemoveBe'75MG'kgpsIUTha$ ) THREE TIMES DAILY   Lancets Misc. (ACCU-CHEK SOFTCLIX LANCET DEV) KIT Use to test blood sugar daily   losartan (COZAAR) 50 MG tablet TAKE 1 TABLET (50 MG TOTAL) BY MOUTH DAILY.   metFORMIN (GLUCOPHAGE) 500 MG tablet TAKE 1 TABLET TWICE DAILY WITH MEALS   methocarbamol (ROBAXIN) 500 MG tablet Take 1 tablet (500 mg total) by mouth every 8 (eight) hours as needed for muscle spasms.   Misc Natural Products (SUPER GREENS PO) Take 1 tablet by mouth 2 (two) times daily. Brand: Goli, gummy   potassium chloride SA (KLOR-CON M) 20 MEQ tablet TAKE 1 TABLET TWICE DAILY   rivaroxaban (XARELTO) 20 MG TABS tablet Take 1 tablet (20 mg total) by mouth  daily with supper.   Senna 8.7 MG CHEW Chew 1 tablet by mouth as needed.   traMADol (ULTRAM) 50 MG tablet Take 1 tablet (50 mg total) by mouth every 6 (six) hours as needed for pain   TRUEplus Lancets 28G MISC 1 Device by Other route daily as needed. DX:E11.22   UNABLE TO FIND Take 1 tablet by mouth daily. Med Name: Cinnachroma   UNABLE TO FIND Med Name: Goli Beet supplement 1 by mouth 2 times daily   APPLE CIDER VINEGAR PO Take 1 tablet by mouth 3 (three) times daily. Brand: Goli, gummy (Patient not taking: Reported on 10/30/2021)   No facility-administered encounter medications on file as of 10/30/2021.    Allergies  (verified) Lisinopril   History: Past Medical History:  Diagnosis Date   Acute on chronic diastolic CHF (congestive heart failure) ([place]) 06/01/2017   Arthritis    Cancer ([place]) 2010   Prostate   Chronic kidney disease    Diabetes mellitus    Diabetic neuropathy ([place])    feet   DVT (deep venous thrombosis) ([place])    Genetic testing 06/22/2016   Mr. Smolinsky underwent genetic counseling and testing for hereditary cancer syndromes on 05/14/2016. His results were negative for mutations in all 46 genes analyzed by [facility]'s 46-gene Common Hereditary Cancers Panel. Genes analyzed include: APC, ATM, AXIN2, BARD1, BMPR1A, BRCA1, BRCA2, BRIP1, CDH1, CDKN2A, CHEK2, CTNNA1, DICER1, EPCAM, GREM1, HOXB13, KIT, MEN1, MLH1, MSH2, MSH3, MSH6, MUTYH, NB   GERD (gastroesophageal reflux disease)    Hypertension    PE (pulmonary thromboembolism) (H[facility])    Pneumonia    Sleep apnea    not wearing CPAP   Suprapubic catheter ([place]) 11/17/2019   Past Surgical History:  Procedure Laterality Date   CARDIOVERSION N/A 06/03/2017   Procedure: CARDIOVERSION;  Surgeon: Jerline Pain, MD;  Location: [facility];  Service: Cardiovascular;  Laterality: N/A;   CATARACT EXTRACTION Left 07/31/2021   Dr.Glenn, [place], [place]   COLONOSCOPY     COLONOSCOPY WITH PROPOFOL N/A 10/20/2018   Procedure: COLONOSCOPY WITH PROPOFOL;  Surgeon: Thornton Park, MD;  Location: [facility];  Service: Gastroenterology;  Laterality: N/A;   HERNIA REPAIR     KNEE SURGERY     MULTIPLE TOOTH EXTRACTIONS     POLYPECTOMY  10/20/2018   Procedure: POLYPECTOMY;  Surgeon: Thornton Park, MD;  Location: [facility];  Service: Gastroenterology;;   PROSTATE SURGERY     RADIOACTIVE SEED IMPLANT     RIGHT/LEFT HEART CATH AND CORONARY ANGIOGRAPHY N/A 07/06/2017   Procedure: RIGHT/LEFT HEART CATH AND CORONARY ANGIOGRAPHY;  Surgeon: Jettie Booze, MD;  Location: [facility];  Service: Cardiovascular;  Laterality: N/A;    SHOULDER SURGERY     TOTAL KNEE ARTHROPLASTY Right 11/19/2016   Procedure: RIGHT TOTAL KNEE ARTHROPLASTY;  Surgeon: Leandrew Koyanagi, MD;  Location: [facility];  Service: Orthopedics;  Laterality: Right;   uretha surgery-2014     Family History  Problem Relation Age of Onset   Breast cancer Mother 37       d.46   Breast cancer Sister 30       d.30   Leukemia Brother 18       d.20   Breast cancer Maternal Aunt 40       d.31s   Lung cancer Maternal Uncle    Prostate cancer Paternal Uncle    Prostate cancer Brother        recurred recently at age 21   Other Brother  15       spinal tumor   Cervical cancer Other 22       d.22   Cancer Sister 67       unspecified type   Cancer Maternal Uncle 80       unspecified type   Social History   Socioeconomic History   Marital status: Married    Spouse name: Not on file   Number of children: Not on file   Years of education: Not on file   Highest education level: Not on file  Occupational History   Not on file  Tobacco Use   Smoking status: Never   Smokeless tobacco: Never  Vaping Use   Vaping Use: Never used  Substance and Sexual Activity   Alcohol use: No    Comment: never   Drug use: No   Sexual activity: Yes  Other Topics Concern   Not on file  Social History Narrative   Not on file   Social Determinants of Health   Financial Resource Strain: Medium Risk (12/09/2017)   Overall Financial Resource Strain (CARDIA)    Difficulty of Paying Living Expenses: Somewhat hard  Food Insecurity: No Food Insecurity (12/09/2017)   Hunger Vital Sign    Worried About Running Out of Food in the Last Year: Never true    [place] in the Last Year: Never true  Transportation Needs: No Transportation Needs (12/09/2017)   PRAPARE - Hydrologist (Medical): No    Lack of Transportation (Non-Medical): No  Physical Activity: Inactive (12/09/2017)   Exercise Vital Sign    Days of Exercise per Week: 0 days     Minutes of Exercise per Session: 0 min  Stress: No Stress Concern Present (12/09/2017)   [place]    Feeling of Stress : Only a little  Social Connections: Somewhat Isolated (12/09/2017)   Social Connection and Isolation Panel [NHANES]    Frequency of Communication with Friends and Family: More than three times a week    Frequency of Social Gatherings with Friends and Family: More than three times a week    Attends Religious Services: Never    Marine scientist or Organizations: No    Attends Music therapist: Never    Marital Status: Married    Tobacco Counseling Counseling given: Not Answered   Clinical Intake:  Pre-visit preparation completed: Yes  Pain : 0-10 Pain Score: 3  Pain Type: Chronic pain Pain Location: Leg Pain Orientation: Left, Right Pain Descriptors / Indicators: Numbness     BMI - recorded: 47.4  How often do you need to have someone help you when you read instructions, pamphlets, or other written materials from your doctor or pharmacy?: 5 - Always  Diabetic?yes         Activities of Daily Living    10/30/2021   11:10 AM  In your present state of health, do you have any difficulty performing the following activities:  Hearing? 0  Vision? 0  Difficulty concentrating or making decisions? 1  Walking or climbing stairs? 1  Dressing or bathing? 1  Doing errands, shopping? 1  Preparing Food and eating ? N  Using the Toilet? N  In the past six months, have you accidently leaked urine? N  Do you have problems with loss of bowel control? N  Managing your Medications? N  Managing your Finances? N  Housekeeping or managing your Housekeeping?  Y  Comment family members help    Patient Care Team: [name], NP as PCP - General (Geriatric Medicine) Jettie Booze, MD as PCP - Cardiology (Cardiology) Katy Fitch, Darlina Guys, MD as Consulting Physician  (Ophthalmology)  Indicate any recent Medical Services you may have received from other than [facility] providers in the past year (date may be approximate).     Assessment:   This is a routine wellness examination for [place].  Hearing/Vision screen Hearing Screening - Comments:: Patient states he has a slight decrease in hearing however he does not need audiology referral   Dietary issues and exercise activities discussed:     Goals Addressed   None    Depression Screen    10/30/2021   10:44 AM 05/19/2021    9:57 AM 01/06/2021    8:06 AM 10/24/2020   10:48 AM 05/11/2019    9:35 AM 08/18/2018    9:34 AM 12/09/2017   10:37 AM  PHQ 2/9 Scores  PHQ - 2 Score 0 0 0 0 0 0 0    Fall Risk    10/30/2021   10:44 AM 09/29/2021    9:54 AM 08/18/2021    9:16 AM 07/07/2021    9:46 AM 05/19/2021    9:57 AM  Fall Risk   Falls in the past year? 0 0 0 0 0  Number falls in past yr: 0 0 0 0 0  Injury with Fall? 0 0 0 0 0  Risk for fall due to : No Fall Risks No Fall Risks No Fall Risks No Fall Risks No Fall Risks  Follow up Falls evaluation completed Falls evaluation completed Falls evaluation completed Falls evaluation completed Falls evaluation completed    [place]:  Any stairs in or around the home? Yes  If so, are there any without handrails? No  Home free of loose throw rugs in walkways, pet beds, electrical cords, etc? Yes  Adequate lighting in your home to reduce risk of falls? Yes   ASSISTIVE DEVICES UTILIZED TO PREVENT FALLS:  Life alert? No  Use of a cane, walker or w/c? Yes  Grab bars in the bathroom? No  Shower chair or bench in shower? Yes  Elevated toilet seat or a handicapped toilet? Yes   TIMED UP AND GO:  Was the test performed? No .   Cognitive Function:    12/09/2017   10:30 AM 05/21/2016    9:37 AM  MMSE - Mini Mental State Exam  Orientation to time 4 5  Orientation to Place 5 5  Registration 3 3  Attention/ Calculation 5 4   Recall 2 2  Language- name 2 objects 2 2  Language- repeat 1 1  Language- follow 3 step command 3 3  Language- read & follow direction 1 1  Write a sentence 1 1  Copy design 1 1  Total score 28 28        10/30/2021   10:47 AM 10/24/2020   10:51 AM  6CIT Screen  What Year? 0 points 0 points  What month? 0 points 0 points  What time? 0 points 0 points  Count back from 20 0 points 0 points  Months in reverse 0 points 0 points  Repeat phrase 2 points 10 points  Total Score 2 points 10 points    Immunizations Immunization History  Administered Date(s) Administered   Fluad Quad(high Dose 65+) 12/09/2018, 03/15/2020, 01/06/2021   Influenza, High Dose Seasonal PF 12/14/2016  Influenza,inj,Quad PF,6+ Mos 11/22/2013, 12/05/2014, 10/24/2015   Influenza-Unspecified 11/29/2017   PFIZER(Purple Top)SARS-COV-2 Vaccination 07/29/2019, 08/08/2019, 08/19/2019, 01/25/2020   Pneumococcal Conjugate-13 05/23/2014   Pneumococcal Polysaccharide-23 12/13/2012, 05/21/2016    TDAP status: Due, Education has been provided regarding the importance of this vaccine. Advised may receive this vaccine at local pharmacy or Health Dept. Aware to provide a copy of the vaccination record if obtained from local pharmacy or Health Dept. Verbalized acceptance and understanding.  Flu Vaccine status: Due, Education has been provided regarding the importance of this vaccine. Advised may receive this vaccine at local pharmacy or Health Dept. Aware to provide a copy of the vaccination record if obtained from local pharmacy or Health Dept. Verbalized acceptance and understanding.  Pneumococcal vaccine status: Up to date  Covid-19 vaccine status: Information provided on how to obtain vaccines.   Qualifies for Shingles Vaccine? Yes   Zostavax completed No   Shingrix Completed?: No.    Education has been provided regarding the importance of this vaccine. Patient has been advised to call insurance company to determine out  of pocket expense if they have not yet received this vaccine. Advised may also receive vaccine at local pharmacy or Health Dept. Verbalized acceptance and understanding.  Screening Tests Health Maintenance  Topic Date Due   Zoster Vaccines- Shingrix (1 of 2) Never done   Diabetic kidney evaluation - Urine ACR  06/30/2016   TETANUS/TDAP  07/31/2016   COVID-19 Vaccine (5 - Pfizer risk series) 03/21/2020   OPHTHALMOLOGY EXAM  01/23/2021   INFLUENZA VACCINE  09/16/2021   FOOT EXAM  10/04/2021   HEMOGLOBIN A1C  04/01/2022   Diabetic kidney evaluation - GFR measurement  09/30/2022   COLONOSCOPY (Pts 45-43yrs Insurance coverage will need to be confirmed)  10/19/2028   Pneumonia Vaccine 43+ Years old  Completed   Hepatitis C Screening  Completed   HPV VACCINES  Aged Out    Health Maintenance  Health Maintenance Due  Topic Date Due   Zoster Vaccines- Shingrix (1 of 2) Never done   Diabetic kidney evaluation - Urine ACR  06/30/2016   TETANUS/TDAP  07/31/2016   COVID-19 Vaccine (5 - Pfizer risk series) 03/21/2020   OPHTHALMOLOGY EXAM  01/23/2021   INFLUENZA VACCINE  09/16/2021   FOOT EXAM  10/04/2021    Colorectal cancer screening: Type of screening: Colonoscopy. Completed 2020. Repeat every 10 years  Lung Cancer Screening: (Low Dose CT Chest recommended if Age 68-80 years, 30 pack-year currently smoking OR have quit w/in 15years.) does not qualify.   Lung Cancer Screening Referral: na  Additional Screening:  Hepatitis C Screening: does qualify; Completed 2017  Vision Screening: Recommended annual ophthalmology exams for early detection of glaucoma and other disorders of the eye. Is the patient up to date with their annual eye exam?  Yes  Who is the provider or what is the name of the office in which the patient attends annual eye exams? [facility]  If pt is not established with a provider, would they like to be referred to a provider to establish care? No .   Dental Screening:  Recommended annual dental exams for proper oral hygiene  Community Resource Referral / Chronic Care Management: CRR required this visit?  No   CCM required this visit?  No      Plan:     I have personally reviewed and noted the following in the patient's chart:   Medical and social history Use of alcohol, tobacco or illicit drugs  Current medications  and supplements including opioid prescriptions. Patient is currently taking opioid prescriptions. Information provided to patient regarding non-opioid alternatives. Patient advised to discuss non-opioid treatment plan with their provider. Functional ability and status Nutritional status Physical activity Advanced directives List of other physicians Hospitalizations, surgeries, and ER visits in previous 12 months Vitals Screenings to include cognitive, depression, and falls Referrals and appointments  In addition, I have reviewed and discussed with patient certain preventive protocols, quality metrics, and best practice recommendations. A written personalized care plan for preventive services as well as general preventive health recommendations were provided to patient.     [name], NP   10/30/2021     Virtual Visit via Telephone Note  I connected with patient 10/30/21 at 11:00 AM EDT by telephone and verified that I am speaking with the correct person using two identifiers.  Location: Patient: home Provider: [facility]    I discussed the limitations, risks, security and privacy concerns of performing an evaluation and management service by telephone and the availability of in person appointments. I also discussed with the patient that there may be a patient responsible charge related to this service. The patient expressed understanding and agreed to proceed.   I discussed the assessment and treatment plan with the patient. The patient was provided an opportunity to ask questions and all were answered. The patient  agreed with the plan and demonstrated an understanding of the instructions.   The patient was advised to call back or seek an in-person evaluation if the symptoms worsen or if the condition fails to improve as anticipated.  I provided 16 minutes of non-face-to-face time during this encounter.  Carlos American. Harle Battiest Avs printed and mailed

## 2021-10-30 NOTE — Telephone Encounter (Signed)
[name], sargeant are scheduled for a virtual visit with your provider today.    Just as we do with appointments in the office, we must obtain your consent to participate.  Your consent will be active for this visit and any virtual visit you may have with one of our providers in the next 365 days.    If you have a MyChart account, I can also send a copy of this consent to you electronically.  All virtual visits are billed to your insurance company just like a traditional visit in the office.  As this is a virtual visit, video technology does not allow for your provider to perform a traditional examination.  This may limit your provider's ability to fully assess your condition.  If your provider identifies any concerns that need to be evaluated in person or the need to arrange testing such as labs, EKG, etc, we will make arrangements to do so.    Although advances in technology are sophisticated, we cannot ensure that it will always work on either your end or our end.  If the connection with a video visit is poor, we may have to switch to a telephone visit.  With either a video or telephone visit, we are not always able to ensure that we have a secure connection.   I need to obtain your verbal consent now.   Are you willing to proceed with your visit today?   Thomas [name] has provided verbal consent on 10/30/2021 for a virtual visit (video or telephone).   Leigh Aurora [place], [place] 10/30/2021  10:53 AM

## 2021-11-05 DIAGNOSIS — Z466 Encounter for fitting and adjustment of urinary device: Secondary | ICD-10-CM | POA: Diagnosis not present

## 2021-11-05 DIAGNOSIS — R339 Retention of urine, unspecified: Secondary | ICD-10-CM | POA: Diagnosis not present

## 2021-11-13 DIAGNOSIS — M170 Bilateral primary osteoarthritis of knee: Secondary | ICD-10-CM | POA: Diagnosis not present

## 2021-11-18 ENCOUNTER — Ambulatory Visit (INDEPENDENT_AMBULATORY_CARE_PROVIDER_SITE_OTHER): Payer: Medicare HMO | Admitting: Family

## 2021-11-18 ENCOUNTER — Other Ambulatory Visit (HOSPITAL_COMMUNITY): Payer: Self-pay

## 2021-11-18 ENCOUNTER — Encounter: Payer: Self-pay | Admitting: Family

## 2021-11-18 VITALS — BP 152/76 | HR 71 | Temp 97.6°F | Resp 16 | Ht 69.0 in | Wt 330.0 lb

## 2021-11-18 DIAGNOSIS — Z20822 Contact with and (suspected) exposure to covid-19: Secondary | ICD-10-CM

## 2021-11-18 MED ORDER — VITAMIN D3 50 MCG (2000 UT) PO CAPS
2000.0000 [IU] | ORAL_CAPSULE | Freq: Every day | ORAL | 0 refills | Status: AC
  Filled 2021-11-18: qty 14, 14d supply, fill #0

## 2021-11-18 MED ORDER — ASCORBIC ACID 500 MG PO TABS
500.0000 mg | ORAL_TABLET | Freq: Every day | ORAL | 0 refills | Status: DC
  Filled 2021-11-18: qty 14, 14d supply, fill #0

## 2021-11-18 MED ORDER — ZINC GLUCONATE 50 MG PO TABS
50.0000 mg | ORAL_TABLET | Freq: Every day | ORAL | 0 refills | Status: DC
  Filled 2021-11-18: qty 14, 14d supply, fill #0

## 2021-11-18 NOTE — Patient Instructions (Addendum)
-   Take Zinc 50 mg tablet one by mouth daily for 14 days  - Take Vitamin C 500 mg tablet one by mouth twice daily x 14 days  -  vitamin D 2000 units one by mouth daily x 14 days  - Tylenol as needed for fever or body aches  - over the counter Mucinex as needed for cough  - increase your fruits intake in your diet  - increase your water intake to 6-8 glasses of water daily  - Notify provider or go to ED if you develop any chest tightness,chest pain or shortness of breath

## 2021-11-18 NOTE — Progress Notes (Unsigned)
Provider: Penny Frisbie FNP-C  Sharon Seller, NP  Patient Care Team: Sharon Seller, NP as PCP - General (Geriatric Medicine) Corky Crafts, MD as PCP - Cardiology (Cardiology) Dione Booze, Bertram Millard, MD as Consulting Physician (Ophthalmology)  Extended Emergency Contact Information Primary Emergency Contact: [facility] Address: [address]          [place], [place] [address] [place] Home Phone: [phone] Mobile Phone: [phone] Relation: Spouse Secondary Emergency Contact: Keirsey,Kabirah A Address: [address]          [place], [place] [address] [place] Home Phone: [phone] Relation: Daughter  Code Status:  Full Code Goals of care: Advanced Directive information    11/18/2021    2:13 PM  Advanced Directives  Does Patient Have a Medical Advance Directive? No  Does patient want to make changes to medical advance directive? No - Patient declined  Would patient like information on creating a medical advance directive? No - Patient declined     Chief Complaint  Patient presents with   Acute Visit    Patient is here because he has been having some congestion and wife tested positive on Sunday with a faint line    HPI:  Pt is a 71 y.o. male seen today for an acute visit for evaluation of some nasal congestion.States wife COVID-19 test was slightly light positive on Sunday but went to her PCP today.  She denies any fever,chills,cough,fatigue,body aches,runny nose,chest tightness,chest pain,palpitation or shortness of breath.    Past Medical History:  Diagnosis Date   Acute on chronic diastolic CHF (congestive heart failure) (HCC) 06/01/2017   Arthritis    Cancer (HCC) 2010   Prostate   Chronic kidney disease    Diabetes mellitus    Diabetic neuropathy (HCC)    feet   DVT (deep venous thrombosis) (HCC)    Genetic testing 06/22/2016   Mr. Mcknight underwent genetic counseling and testing for  hereditary cancer syndromes on 05/14/2016. His results were negative for mutations in all 46 genes analyzed by [facility]'s 46-gene Common Hereditary Cancers Panel. Genes analyzed include: APC, ATM, AXIN2, BARD1, BMPR1A, BRCA1, BRCA2, BRIP1, CDH1, CDKN2A, CHEK2, CTNNA1, DICER1, EPCAM, GREM1, HOXB13, KIT, MEN1, MLH1, MSH2, MSH3, MSH6, MUTYH, NB   GERD (gastroesophageal reflux disease)    Hypertension    PE (pulmonary thromboembolism) (HCC)    Pneumonia    Sleep apnea    not wearing CPAP   Suprapubic catheter (HCC) 11/17/2019   Past Surgical History:  Procedure Laterality Date   CARDIOVERSION N/A 06/03/2017   Procedure: CARDIOVERSION;  Surgeon: Jake Bathe, MD;  Location: [facility] ENDOSCOPY;  Service: Cardiovascular;  Laterality: N/A;   CATARACT EXTRACTION Left 07/31/2021   Dr.Glenn, [facility]   COLONOSCOPY     COLONOSCOPY WITH PROPOFOL N/A 10/20/2018   Procedure: COLONOSCOPY WITH PROPOFOL;  Surgeon: Tressia Danas, MD;  Location: [facility] ENDOSCOPY;  Service: Gastroenterology;  Laterality: N/A;   HERNIA REPAIR     KNEE SURGERY     MULTIPLE TOOTH EXTRACTIONS     POLYPECTOMY  10/20/2018   Procedure: POLYPECTOMY;  Surgeon: Tressia Danas, MD;  Location: [facility] ENDOSCOPY;  Service: Gastroenterology;;   PROSTATE SURGERY     RADIOACTIVE SEED IMPLANT     RIGHT/LEFT HEART CATH AND CORONARY ANGIOGRAPHY N/A 07/06/2017   Procedure: RIGHT/LEFT HEART CATH AND CORONARY ANGIOGRAPHY;  Surgeon: Corky Crafts, MD;  Location: [facility] INVASIVE CV LAB;  Service: Cardiovascular;  Laterality: N/A;   SHOULDER SURGERY  TOTAL KNEE ARTHROPLASTY Right 11/19/2016   Procedure: RIGHT TOTAL KNEE ARTHROPLASTY;  Surgeon: Leandrew Koyanagi, MD;  Location: [facility];  Service: Orthopedics;  Laterality: Right;   uretha surgery-2014      Allergies  Allergen Reactions   Lisinopril Cough    Outpatient Encounter Medications as of 11/18/2021  Medication Sig   Accu-Chek Softclix Lancets lancets Use to test blood sugar  daily   Alcohol Swabs (B-D SINGLE USE SWABS REGULAR) PADS Use in testing blood sugar daily. Dx: E11.22   amLODipine (NORVASC) 10 MG tablet TAKE 1 TABLET EVERY DAY FOR HIGH BLOOD PRESSURE   ASHWAGANDHA PO Take 1 tablet by mouth in the morning and at bedtime. Brand: Goli, gummy   aspirin EC 81 MG tablet Take 81 mg by mouth daily.   atorvastatin (LIPITOR) 40 MG tablet Take 1 tablet (40 mg total) by mouth daily.   Blood Glucose Calibration (ACCU-CHEK AVIVA) SOLN Use once daily as directed dx E11.22   Blood Glucose Monitoring Suppl (TRUE METRIX AIR GLUCOSE METER) w/Device KIT 1 Device by Does not apply route daily as needed. E11.22   carvedilol (COREG) 12.5 MG tablet TAKE 1 TABLET TWICE DAILY WITH MEALS   empagliflozin (JARDIANCE) 10 MG TABS tablet Take 1 tablet (10 mg total) by mouth daily.   furosemide (LASIX) 40 MG tablet TAKE 1 TABLET (40 MG TOTAL) BY MOUTH DAILY.   glipiZIDE (GLUCOTROL) 5 MG tablet TAKE 1 TABLET EVERY DAY BEFORE BREAKFAST   glucose blood (ACCU-CHEK AVIVA PLUS) test strip Use to test blood sugar daily.   hydrALAZINE (APRESOLINE) 25 MG tablet TAKE 3 TABLETS ($RemoveBe'75MG'cPLQRrTTm$ ) THREE TIMES DAILY   Lancets Misc. (ACCU-CHEK SOFTCLIX LANCET DEV) KIT Use to test blood sugar daily   losartan (COZAAR) 50 MG tablet TAKE 1 TABLET (50 MG TOTAL) BY MOUTH DAILY.   metFORMIN (GLUCOPHAGE) 500 MG tablet TAKE 1 TABLET TWICE DAILY WITH MEALS   methocarbamol (ROBAXIN) 500 MG tablet Take 1 tablet (500 mg total) by mouth every 8 (eight) hours as needed for muscle spasms.   Misc Natural Products (SUPER GREENS PO) Take 1 tablet by mouth 2 (two) times daily. Brand: Goli, gummy   potassium chloride SA (KLOR-CON M) 20 MEQ tablet TAKE 1 TABLET TWICE DAILY   rivaroxaban (XARELTO) 20 MG TABS tablet Take 1 tablet (20 mg total) by mouth daily with supper.   Senna 8.7 MG CHEW Chew 1 tablet by mouth as needed.   traMADol (ULTRAM) 50 MG tablet Take 1 tablet (50 mg total) by mouth every 6 (six) hours as needed for pain    TRUEplus Lancets 28G MISC 1 Device by Other route daily as needed. DX:E11.22   UNABLE TO FIND Take 1 tablet by mouth daily. Med Name: Cinnachroma   UNABLE TO FIND Med Name: Goli Beet supplement 1 by mouth 2 times daily   [DISCONTINUED] APPLE CIDER VINEGAR PO Take 1 tablet by mouth 3 (three) times daily. Brand: Goli, gummy   No facility-administered encounter medications on file as of 11/18/2021.    Review of Systems  Constitutional:  Negative for appetite change, chills, fatigue, fever and unexpected weight change.  HENT:  Negative for congestion, dental problem, ear discharge, ear pain, facial swelling, hearing loss, nosebleeds, postnasal drip, rhinorrhea, sinus pressure, sinus pain, sneezing, sore throat, tinnitus and trouble swallowing.   Eyes:  Negative for pain, discharge, redness, itching and visual disturbance.  Respiratory:  Negative for cough, chest tightness, shortness of breath and wheezing.   Cardiovascular:  Negative for chest pain,  palpitations and leg swelling.  Gastrointestinal:  Negative for abdominal distention, abdominal pain, blood in stool, constipation, diarrhea, nausea and vomiting.  Genitourinary:  Negative for difficulty urinating, dysuria, flank pain, frequency and urgency.  Musculoskeletal:  Positive for gait problem. Negative for arthralgias, back pain, joint swelling, myalgias, neck pain and neck stiffness.  Skin:  Negative for color change, pallor, rash and wound.  Neurological:  Negative for dizziness, syncope, speech difficulty, weakness, light-headedness, numbness and headaches.    Immunization History  Administered Date(s) Administered   Fluad Quad(high Dose 65+) 12/09/2018, 03/15/2020, 01/06/2021   Influenza, High Dose Seasonal PF 12/14/2016   Influenza,inj,Quad PF,6+ Mos 11/22/2013, 12/05/2014, 10/24/2015   Influenza-Unspecified 11/29/2017   PFIZER(Purple Top)SARS-COV-2 Vaccination 07/29/2019, 08/08/2019, 08/19/2019, 01/25/2020   Pneumococcal  Conjugate-13 05/23/2014   Pneumococcal Polysaccharide-23 12/13/2012, 05/21/2016   Pertinent  Health Maintenance Due  Topic Date Due   OPHTHALMOLOGY EXAM  01/23/2021   INFLUENZA VACCINE  09/16/2021   FOOT EXAM  10/04/2021   HEMOGLOBIN A1C  04/01/2022   COLONOSCOPY (Pts 45-69yrs Insurance coverage will need to be confirmed)  10/19/2028      07/07/2021    9:46 AM 08/18/2021    9:16 AM 09/29/2021    9:54 AM 10/30/2021   10:44 AM 11/18/2021    2:13 PM  Fall Risk  Falls in the past year? 0 0 0 0 0  Was there an injury with Fall? 0 0 0 0 0  Fall Risk Category Calculator 0 0 0 0 0  Fall Risk Category Low Low Low Low Low  Patient Fall Risk Level Low fall risk Low fall risk Low fall risk Low fall risk Low fall risk  Patient at Risk for Falls Due to No Fall Risks No Fall Risks No Fall Risks No Fall Risks History of fall(s)  Fall risk Follow up Falls evaluation completed Falls evaluation completed Falls evaluation completed Falls evaluation completed Falls evaluation completed   Functional Status Survey:    Vitals:   11/18/21 1411  BP: (!) 152/76  Pulse: 71  Resp: 16  Temp: 97.6 F (36.4 C)  TempSrc: Temporal  SpO2: 96%  Weight: (!) 330 lb (149.7 kg)  Height: $Remove'5\' 9"'uEglGzK$  (1.753 m)   Body mass index is 48.73 kg/m. Physical Exam Vitals reviewed.  Constitutional:      General: He is not in acute distress.    Appearance: Normal appearance. He is obese. He is not ill-appearing or diaphoretic.  HENT:     Head: Normocephalic.     Right Ear: Tympanic membrane, ear canal and external ear normal. There is no impacted cerumen.     Left Ear: Tympanic membrane, ear canal and external ear normal. There is no impacted cerumen.     Nose: Nose normal. No congestion or rhinorrhea.     Mouth/Throat:     Mouth: Mucous membranes are moist.     Pharynx: Oropharynx is clear. No oropharyngeal exudate or posterior oropharyngeal erythema.  Eyes:     General: No scleral icterus.       Right eye: No  discharge.        Left eye: No discharge.     Conjunctiva/sclera: Conjunctivae normal.     Pupils: Pupils are equal, round, and reactive to light.  Neck:     Vascular: No carotid bruit.  Cardiovascular:     Rate and Rhythm: Normal rate and regular rhythm.     Pulses: Normal pulses.     Heart sounds: Normal heart sounds. No murmur heard.  No friction rub. No gallop.  Pulmonary:     Effort: Pulmonary effort is normal. No respiratory distress.     Breath sounds: Normal breath sounds. No wheezing, rhonchi or rales.  Chest:     Chest wall: No tenderness.  Abdominal:     General: Bowel sounds are normal. There is no distension.     Palpations: Abdomen is soft. There is no mass.     Tenderness: There is no abdominal tenderness. There is no right CVA tenderness, left CVA tenderness, guarding or rebound.  Musculoskeletal:        General: No swelling or tenderness. Normal range of motion.     Cervical back: Normal range of motion. No rigidity or tenderness.     Right lower leg: No edema.     Left lower leg: No edema.  Lymphadenopathy:     Cervical: No cervical adenopathy.  Skin:    General: Skin is warm and dry.     Coloration: Skin is not pale.     Findings: No erythema or rash.  Neurological:     Mental Status: He is alert and oriented to person, place, and time.     Motor: No weakness.     Gait: Gait abnormal.  Psychiatric:        Mood and Affect: Mood normal.        Speech: Speech normal.        Behavior: Behavior normal.     Labs reviewed: Recent Labs    05/08/21 1815 05/19/21 1029 09/29/21 1018  NA 142 146 144  K 3.3* 3.8 3.4*  CL 108 110 108  CO2 $Re'26 29 23  'JpE$ GLUCOSE 144* 158* 202*  BUN $Re'14 15 20  'klJ$ CREATININE 1.05 0.99 1.16  CALCIUM 9.0 8.6 8.8  MG 1.9  --   --    Recent Labs    01/06/21 0834 05/08/21 1815 09/29/21 1018  AST 11 17 8*  ALT $Re'12 17 13  'mCW$ ALKPHOS  --  62  --   BILITOT 0.8 0.8 0.7  PROT 7.5 7.3 6.8  ALBUMIN  --  3.7  --    Recent Labs     01/06/21 0834 05/08/21 1815 09/29/21 1018  WBC 8.9 8.2 5.9  NEUTROABS 6,168 6.2 4,372  HGB 13.3 13.5 13.6  HCT 41.1 43.1 41.8  MCV 84.0 88.5 86.9  PLT 371 248 254   Lab Results  Component Value Date   TSH 0.95 11/06/2019   Lab Results  Component Value Date   HGBA1C 7.2 (H) 09/29/2021   Lab Results  Component Value Date   CHOL 113 05/19/2021   HDL 52 05/19/2021   LDLCALC 47 05/19/2021   TRIG 65 05/19/2021   CHOLHDL 2.2 05/19/2021    Significant Diagnostic Results in last 30 days:  No results found.  Assessment/Plan   Exposure to COVID-19 virus Afebrile - POC COVID-19 during the office tested negative -As symptomatic - Recommended supportive care since test is negative for COVID -We will continue to monitor for now if wife test positive will consider's starting on antiviral -Encouraged to increase fluid intake and start on supplement as below - ascorbic acid (VITAMIN C) 500 MG tablet; Take 1 tablet (500 mg total) by mouth daily.  Dispense: 14 tablet; Refill: 0 - zinc gluconate 50 MG tablet; Take 1 tablet (50 mg total) by mouth daily.  Dispense: 14 tablet; Refill: 0 -Advised to increase vitamin D to 2000 units daily x14 days  Family/ staff Communication: Reviewed plan of  care with patient verbalized understanding  Labs/tests ordered:  POC COVID-19   Next Appointment: Return if symptoms worsen or fail to improve.   Sandrea Hughs, NP

## 2021-11-19 ENCOUNTER — Telehealth: Payer: Self-pay | Admitting: *Deleted

## 2021-11-19 LAB — POC COVID19 BINAXNOW

## 2021-11-19 NOTE — Telephone Encounter (Signed)
Received fax from [name] stating that patient's insurance requires patient to transition oxygen services to [place]. Requesting a valid prescription with Liter flow,method of delivery and how often.   Thomas [name] wanted to confirm with patient.   I called patient and he stated that we DO NOT prescribe his Oxygen and stated that he will call [facility] [phone]  Agreed.    Patient also wanted Thomas [name] to know that he has Received his Wheelchair.

## 2021-11-20 NOTE — Telephone Encounter (Signed)
Great - thank you for the update.

## 2021-12-02 ENCOUNTER — Other Ambulatory Visit (HOSPITAL_COMMUNITY): Payer: Self-pay

## 2021-12-03 ENCOUNTER — Other Ambulatory Visit: Payer: Self-pay | Admitting: Nurse Practitioner

## 2021-12-03 ENCOUNTER — Other Ambulatory Visit (HOSPITAL_COMMUNITY): Payer: Self-pay

## 2021-12-03 DIAGNOSIS — Z9359 Other cystostomy status: Secondary | ICD-10-CM | POA: Diagnosis not present

## 2021-12-03 DIAGNOSIS — M170 Bilateral primary osteoarthritis of knee: Secondary | ICD-10-CM

## 2021-12-03 DIAGNOSIS — Z466 Encounter for fitting and adjustment of urinary device: Secondary | ICD-10-CM | POA: Diagnosis not present

## 2021-12-03 MED ORDER — TRAMADOL HCL 50 MG PO TABS
50.0000 mg | ORAL_TABLET | Freq: Four times a day (QID) | ORAL | 0 refills | Status: DC | PRN
  Filled 2021-12-03: qty 30, 8d supply, fill #0

## 2021-12-04 ENCOUNTER — Other Ambulatory Visit (HOSPITAL_COMMUNITY): Payer: Self-pay

## 2021-12-12 ENCOUNTER — Telehealth: Payer: Self-pay | Admitting: *Deleted

## 2021-12-12 NOTE — Telephone Encounter (Signed)
Patient dropped off [place] [phone] Patient Assistance form for [place].   Filled out and placed in [place] folder to review and sign. Attached current list of medications.   To be faxed back to Fax:[phone]

## 2021-12-31 DIAGNOSIS — Z466 Encounter for fitting and adjustment of urinary device: Secondary | ICD-10-CM | POA: Diagnosis not present

## 2021-12-31 DIAGNOSIS — R339 Retention of urine, unspecified: Secondary | ICD-10-CM | POA: Diagnosis not present

## 2022-01-12 ENCOUNTER — Ambulatory Visit: Payer: Medicare HMO | Admitting: Physical Therapy

## 2022-01-12 NOTE — Therapy (Incomplete)
OUTPATIENT PHYSICAL THERAPY LOWER EXTREMITY EVALUATION   Patient Name: Thomas [name] MRN: [id number] DOB:[birth year], 71 y.o., male Today's Date: 01/12/2022  END OF SESSION:   Past Medical History:  Diagnosis Date   Acute on chronic diastolic CHF (congestive heart failure) ([place]) 06/01/2017   Arthritis    Cancer ([place]) 2010   Prostate   Chronic kidney disease    Diabetes mellitus    Diabetic neuropathy ([place])    feet   DVT (deep venous thrombosis) ([place])    Genetic testing 06/22/2016   Mr. Granquist underwent genetic counseling and testing for hereditary cancer syndromes on 05/14/2016. His results were negative for mutations in all 46 genes analyzed by [facility]'s 46-gene Common Hereditary Cancers Panel. Genes analyzed include: APC, ATM, AXIN2, BARD1, BMPR1A, BRCA1, BRCA2, BRIP1, CDH1, CDKN2A, CHEK2, CTNNA1, DICER1, EPCAM, GREM1, HOXB13, KIT, MEN1, MLH1, MSH2, MSH3, MSH6, MUTYH, NB   GERD (gastroesophageal reflux disease)    Hypertension    PE (pulmonary thromboembolism) (HCC)    Pneumonia    Sleep apnea    not wearing CPAP   Suprapubic catheter ([place]) 11/17/2019   Past Surgical History:  Procedure Laterality Date   CARDIOVERSION N/A 06/03/2017   Procedure: CARDIOVERSION;  Surgeon: Jerline Pain, MD;  Location: [facility];  Service: Cardiovascular;  Laterality: N/A;   CATARACT EXTRACTION Left 07/31/2021   Dr.Glenn, [place] [place]   COLONOSCOPY     COLONOSCOPY WITH PROPOFOL N/A 10/20/2018   Procedure: COLONOSCOPY WITH PROPOFOL;  Surgeon: Thornton Park, MD;  Location: [facility] ENDOSCOPY;  Service: Gastroenterology;  Laterality: N/A;   HERNIA REPAIR     KNEE SURGERY     MULTIPLE TOOTH EXTRACTIONS     POLYPECTOMY  10/20/2018   Procedure: POLYPECTOMY;  Surgeon: Thornton Park, MD;  Location: [facility] ENDOSCOPY;  Service: Gastroenterology;;   PROSTATE SURGERY     RADIOACTIVE SEED IMPLANT     RIGHT/LEFT HEART CATH AND CORONARY ANGIOGRAPHY N/A 07/06/2017   Procedure:  RIGHT/LEFT HEART CATH AND CORONARY ANGIOGRAPHY;  Surgeon: Jettie Booze, MD;  Location: [facility] CV LAB;  Service: Cardiovascular;  Laterality: N/A;   SHOULDER SURGERY     TOTAL KNEE ARTHROPLASTY Right 11/19/2016   Procedure: RIGHT TOTAL KNEE ARTHROPLASTY;  Surgeon: Leandrew Koyanagi, MD;  Location: [facility];  Service: Orthopedics;  Laterality: Right;   uretha surgery-2014     Patient Active Problem List   Diagnosis Date Noted   Dyspnea 10/19/2017   Anticoagulated 08/09/7626   Chronic systolic heart failure ([place])    Pulmonary hypertension, unspecified ([place])    Chest pain 06/01/2017   New onset atrial fibrillation ([place]) 06/01/2017   CKD (chronic kidney disease), stage III ([place]) 06/01/2017   Acute on chronic diastolic CHF (congestive heart failure) ([place]) 06/01/2017   Atrial fibrillation with RVR ([place]) 05/31/2017   Total knee replacement status 11/19/2016   Meniscal injury 10/09/2016   Genetic testing 06/22/2016   DVT, lower extremity, proximal, acute, left ([place]) 01/23/2016   Hematuria 12/12/2015   Type 2 diabetes mellitus with chronic kidney disease, without long-term current use of insulin ([place]) 12/05/2014   Benign prostatic hyperplasia with urinary obstruction 07/25/2014   Urine retention 07/25/2014   Urinary tract infection 07/09/2014   Anemia, iron deficiency 11/22/2013   Urinary retention 11/01/2013   Incomplete emptying of bladder 07/26/2013   Nocturia 07/26/2013   Other nonspecific finding on examination of urine 07/26/2013   Sleep apnea 07/26/2013   Urethral stricture 07/26/2013   Bladder neck contracture 07/04/2013   Severe obesity (  BMI >= 40) ([place]) 05/23/2013   B12 deficiency 05/23/2013   Hyperlipidemia with target LDL less than 100 09/02/2012   HTN (hypertension) 05/26/2012   Osteoarthritis, knee 05/26/2012   MORBID OBESITY 08/22/2008    PCP: [name], NP   REFERRING PROVIDER: [name], NP   REFERRING DIAG: Primary osteoarthritis of both  knees [M17.0], Abnormal gait [R26.9]   THERAPY DIAG:  No diagnosis found.  Rationale for Evaluation and Treatment: Rehabilitation  ONSET DATE: ***  SUBJECTIVE:   SUBJECTIVE STATEMENT: ***  PERTINENT HISTORY: *** PAIN:  Are you having pain? Yes: NPRS scale: ***/10 Pain location: *** Pain description: *** Aggravating factors: *** Relieving factors: ***  PRECAUTIONS: {Therapy precautions:24002}  WEIGHT BEARING RESTRICTIONS: {Yes ***/No:24003}  FALLS:  Has patient fallen in last 6 months? {fallsyesno:27318}  LIVING ENVIRONMENT: Lives with: {OPRC lives with:25569::"lives with their family"} Lives in: {Lives in:25570} Stairs: {opstairs:27293} Has following equipment at home: {Assistive devices:23999}  OCCUPATION: ***  PLOF: {PLOF:24004}  PATIENT GOALS: ***  NEXT MD VISIT:   OBJECTIVE:   DIAGNOSTIC FINDINGS: N/A  PATIENT SURVEYS:  FOTO ***  COGNITION: Overall cognitive status: {cognition:24006}     SENSATION: {sensation:27233}  EDEMA:  {edema:24020}  MUSCLE LENGTH: Hamstrings: Right *** deg; Left *** deg Thomas test: Right *** deg; Left *** deg  POSTURE: {posture:25561}  PALPATION: ***  LOWER EXTREMITY ROM:  Active ROM Right eval Left eval  Hip flexion    Hip extension    Hip abduction    Hip adduction    Hip internal rotation    Hip external rotation    Knee flexion    Knee extension    Ankle dorsiflexion    Ankle plantarflexion    Ankle inversion    Ankle eversion     (Blank rows = not tested)  LOWER EXTREMITY MMT:  MMT Right eval Left eval  Hip flexion    Hip extension    Hip abduction    Hip adduction    Hip internal rotation    Hip external rotation    Knee flexion    Knee extension    Ankle dorsiflexion    Ankle plantarflexion    Ankle inversion    Ankle eversion     (Blank rows = not tested)  LOWER EXTREMITY SPECIAL TESTS:  {LEspecialtests:26242}  FUNCTIONAL TESTS:  {Functional  tests:24029}  GAIT: Distance walked: *** Assistive device utilized: {Assistive devices:23999} Level of assistance: {Levels of assistance:24026} Comments: ***   TODAY'S TREATMENT:                                                                                                                              DATE: ***    PATIENT EDUCATION:  Education details: evaluation findings, POC, goals, HEP with proper form/ rationale.  Person educated: Patient Education method: Explanation, Verbal cues, and Handouts Education comprehension: verbalized understanding  HOME EXERCISE PROGRAM: ***  ASSESSMENT:  CLINICAL IMPRESSION: Patient is a 71 y.o. M  who was seen today for physical therapy evaluation and treatment for ***.   OBJECTIVE IMPAIRMENTS: {opptimpairments:25111}.   ACTIVITY LIMITATIONS: {activitylimitations:27494}  PARTICIPATION LIMITATIONS: {participationrestrictions:25113}  PERSONAL FACTORS: {Personal factors:25162} are also affecting patient's functional outcome.   REHAB POTENTIAL: {rehabpotential:25112}  CLINICAL DECISION MAKING: {clinical decision making:25114}  EVALUATION COMPLEXITY: {Evaluation complexity:25115}   GOALS: Goals reviewed with patient? Yes  SHORT TERM GOALS: Target date: *** *** Baseline: Goal status: {GOALSTATUS:25110}  2.  *** Baseline:  Goal status: {GOALSTATUS:25110}  3.  *** Baseline:  Goal status: {GOALSTATUS:25110}  4.  *** Baseline:  Goal status: {GOALSTATUS:25110}  LONG TERM GOALS: Target date: ***  *** Baseline:  Goal status: {GOALSTATUS:25110}  2.  *** Baseline:  Goal status: {GOALSTATUS:25110}  3.  *** Baseline:  Goal status: {GOALSTATUS:25110}  4.  *** Baseline:  Goal status: {GOALSTATUS:25110}  5.  *** Baseline:  Goal status: {GOALSTATUS:25110}  6.  *** Baseline:  Goal status: {GOALSTATUS:25110}   PLAN:  PT FREQUENCY: 1-2x/week  PT DURATION: {rehab duration:25117}  PLANNED INTERVENTIONS:  Therapeutic exercises, Therapeutic activity, Neuromuscular re-education, Balance training, Gait training, Patient/Family education, Self Care, Joint mobilization, Stair training, Dry Needling, Cryotherapy, Moist heat, Taping, Traction, Ultrasound, Ionotophoresis 49m/ml Dexamethasone, Manual therapy, and Re-evaluation  PLAN FOR NEXT SESSION: Review/ update HEP PRN. ***   Rembert Browe PT, DPT, LAT, ATC  01/12/22  7:52 AM

## 2022-01-14 DIAGNOSIS — R339 Retention of urine, unspecified: Secondary | ICD-10-CM | POA: Diagnosis not present

## 2022-01-14 NOTE — Therapy (Incomplete)
OUTPATIENT PHYSICAL THERAPY LOWER EXTREMITY EVALUATION   Patient Name: Thomas [name] MRN: [id number] DOB:1951-01-21, 71 y.o., male Today's Date: 01/14/2022  END OF SESSION:   Past Medical History:  Diagnosis Date   Acute on chronic diastolic CHF (congestive heart failure) ([place]) 06/01/2017   Arthritis    Cancer ([place]) 2010   Prostate   Chronic kidney disease    Diabetes mellitus    Diabetic neuropathy ([place])    feet   DVT (deep venous thrombosis) ([place])    Genetic testing 06/22/2016   Thomas [name] underwent genetic counseling and testing for hereditary cancer syndromes on 05/14/2016. His results were negative for mutations in all 46 genes analyzed by [facility]'s 46-gene Common Hereditary Cancers Panel. Genes analyzed include: APC, ATM, AXIN2, BARD1, BMPR1A, BRCA1, BRCA2, BRIP1, CDH1, CDKN2A, CHEK2, CTNNA1, DICER1, EPCAM, GREM1, HOXB13, KIT, MEN1, MLH1, MSH2, MSH3, MSH6, MUTYH, NB   GERD (gastroesophageal reflux disease)    Hypertension    PE (pulmonary thromboembolism) (HCC)    Pneumonia    Sleep apnea    not wearing CPAP   Suprapubic catheter ([place]) 11/17/2019   Past Surgical History:  Procedure Laterality Date   CARDIOVERSION N/A 06/03/2017   Procedure: CARDIOVERSION;  Surgeon: Thomas Pain, MD;  Location: [facility];  Service: Cardiovascular;  Laterality: N/A;   CATARACT EXTRACTION Left 07/31/2021   Dr.Glenn, [name], [place]   COLONOSCOPY     COLONOSCOPY WITH PROPOFOL N/A 10/20/2018   Procedure: COLONOSCOPY WITH PROPOFOL;  Surgeon: Thomas Park, MD;  [facility];  Service: Gastroenterology;  Laterality: N/A;   HERNIA REPAIR     KNEE SURGERY     MULTIPLE TOOTH EXTRACTIONS     POLYPECTOMY  10/20/2018   Procedure: POLYPECTOMY;  Surgeon: Thomas Park, MD;  [facility];  Service: Gastroenterology;;   PROSTATE SURGERY     RADIOACTIVE SEED IMPLANT     RIGHT/LEFT HEART CATH AND CORONARY ANGIOGRAPHY N/A 07/06/2017   Procedure:  RIGHT/LEFT HEART CATH AND CORONARY ANGIOGRAPHY;  Surgeon: Thomas Booze, MD;  Location: [facility];  Service: Cardiovascular;  Laterality: N/A;   SHOULDER SURGERY     TOTAL KNEE ARTHROPLASTY Right 11/19/2016   Procedure: RIGHT TOTAL KNEE ARTHROPLASTY;  Surgeon: Thomas Koyanagi, MD;  Location: [facility];  Service: Orthopedics;  Laterality: Right;   uretha surgery-2014     Patient Active Problem List   Diagnosis Date Noted   Dyspnea 10/19/2017   Anticoagulated 88/50/2774   Chronic systolic heart failure ([place])    Pulmonary hypertension, unspecified ([place])    Chest [name] 06/01/2017   New onset atrial fibrillation ([place]) 06/01/2017   CKD (chronic kidney disease), stage III ([place]) 06/01/2017   Acute on chronic diastolic CHF (congestive heart failure) ([place]) 06/01/2017   Atrial fibrillation with RVR ([place]) 05/31/2017   Total knee replacement status 11/19/2016   Meniscal injury 10/09/2016   Genetic testing 06/22/2016   DVT, lower extremity, proximal, acute, left ([place]) 01/23/2016   Hematuria 12/12/2015   Type 2 diabetes mellitus with chronic kidney disease, without long-term current use of insulin ([place]) 12/05/2014   Benign prostatic hyperplasia with urinary obstruction 07/25/2014   Urine retention 07/25/2014   Urinary tract infection 07/09/2014   Anemia, iron deficiency 11/22/2013   Urinary retention 11/01/2013   Incomplete emptying of bladder 07/26/2013   Nocturia 07/26/2013   Other nonspecific finding on examination of urine 07/26/2013   Sleep apnea 07/26/2013   Urethral stricture 07/26/2013   Bladder neck contracture 07/04/2013   Severe obesity (  BMI >= 40) ([place]) 05/23/2013   B12 deficiency 05/23/2013   Hyperlipidemia with target LDL less than 100 09/02/2012   HTN (hypertension) 05/26/2012   Osteoarthritis, knee 05/26/2012   MORBID OBESITY 08/22/2008    PCP: Thomas Chandler, NP   REFERRING PROVIDER: [name], NP   REFERRING DIAG: M17.0 (ICD-10-CM) - Primary  osteoarthritis of both knees R26.9 (ICD-10-CM) - Abnormal gait  THERAPY DIAG: Primary osteoarthritis of both knees, Abnormal gait  Rationale for Evaluation and Treatment: Rehabilitation  ONSET DATE: chronic  SUBJECTIVE:   SUBJECTIVE STATEMENT: ***  PERTINENT HISTORY: HPI: Patient is a 71 y.o. male for mobility assessment.    Reports he request power wheelchair so he can get around better. He has a hard time carrying the wheelchair in and out of the car.  He has a wheelchair lift for his car for a power wheelchair  Reports it would be easiler to get around.    He is using [place] wheelchair at time of visit    He has a wide wheelchair in the care but not able to lift heavy due to cataract surgery.  He is able to move his legs to self propel.  He is also able to move the wheels with his arms.    Reports his thumbs do get sore when self propelling the wheel chair.  [name] in thumbs 3/4 out of 10.  He has used cold and hot water to treat this.  He also has hx of rotator cuff tear so increase use of arms/shoulders causes [name].    Reports he would be able to get around the house more to cook and clean with power chair.    Reports his legs have become weak and therefore unable to get around the house.  Legs are constantly throbbing.  Reports [name] is from knee to ankle.  [name] is a 5/10. Constant [name] and described as trembling. Taking muscle relaxer, mineral rice and tramadol without relief.  Not currently having [name] but most of the time.    Uses a walking stick but only for short distances. Family has gotten scooter but unsure if this will be effective and reports this is too big to be able to get around the home.    Reports he has done his own physical therapy. Reports expensive when he has physical therapy orders.  Does not feel like additional therapy will help him   Reports he feels weak when walking any length. Has a hard time straightening out his legs.  He has followed with  orthopedic in the past and they reported there was not much else they could do.  He is seeing another orthopedic for a second option.  [name]:  Are you having [name]? {OPRCPAIN:27236}  PRECAUTIONS: Fall  WEIGHT BEARING RESTRICTIONS: No  FALLS:  Has patient fallen in last 6 months? No  LIVING ENVIRONMENT: Lives with: lives with their family  OCCUPATION: etired  PLOF: Independent with household mobility with device  PATIENT GOALS: ***  NEXT MD VISIT:   OBJECTIVE:   DIAGNOSTIC FINDINGS: none available  PATIENT SURVEYS:  FOTO ***  COGNITION: Overall cognitive status: Within functional limits for tasks assessed     SENSATION: Not tested   MUSCLE LENGTH: Hamstrings: Right *** deg; Left *** deg Thomas test: Right *** deg; Left *** deg  POSTURE: {posture:25561}  PALPATION: ***  LOWER EXTREMITY ROM:  {AROM/PROM:27142} ROM Right eval Left eval  Hip flexion    Hip extension    Hip abduction  Hip adduction    Hip internal rotation    Hip external rotation    Knee flexion    Knee extension    Ankle dorsiflexion    Ankle plantarflexion    Ankle inversion    Ankle eversion     (Blank rows = not tested)  LOWER EXTREMITY MMT:  MMT Right eval Left eval  Hip flexion    Hip extension    Hip abduction    Hip adduction    Hip internal rotation    Hip external rotation    Knee flexion    Knee extension    Ankle dorsiflexion    Ankle plantarflexion    Ankle inversion    Ankle eversion     (Blank rows = not tested)  LOWER EXTREMITY SPECIAL TESTS:  {LEspecialtests:26242}  FUNCTIONAL TESTS:  {Functional tests:24029}  GAIT: Distance walked: *** Assistive device utilized: {Assistive devices:23999} Level of assistance: {Levels of assistance:24026} Comments: ***   TODAY'S TREATMENT:                                                                                                                              DATE: ***    PATIENT EDUCATION:  Education  details: *** Person educated: {Person educated:25204} Education method: {Education Method:25205} Education comprehension: {Education Comprehension:25206}  HOME EXERCISE PROGRAM: ***  ASSESSMENT:  CLINICAL IMPRESSION: Patient is a *** y.o. *** who was seen today for physical therapy evaluation and treatment for ***.   OBJECTIVE IMPAIRMENTS: {opptimpairments:25111}.   ACTIVITY LIMITATIONS: {activitylimitations:27494}  PARTICIPATION LIMITATIONS: {participationrestrictions:25113}  PERSONAL FACTORS: {Personal factors:25162} are also affecting patient's functional outcome.   REHAB POTENTIAL: {rehabpotential:25112}  CLINICAL DECISION MAKING: {clinical decision making:25114}  EVALUATION COMPLEXITY: {Evaluation complexity:25115}   GOALS: Goals reviewed with patient? {yes/no:20286}  SHORT TERM GOALS: Target date: *** *** Baseline: Goal status: {GOALSTATUS:25110}  2.  *** Baseline:  Goal status: {GOALSTATUS:25110}  3.  *** Baseline:  Goal status: {GOALSTATUS:25110}  4.  *** Baseline:  Goal status: {GOALSTATUS:25110}  5.  *** Baseline:  Goal status: {GOALSTATUS:25110}  6.  *** Baseline:  Goal status: {GOALSTATUS:25110}  LONG TERM GOALS: Target date: ***  *** Baseline:  Goal status: {GOALSTATUS:25110}  2.  *** Baseline:  Goal status: {GOALSTATUS:25110}  3.  *** Baseline:  Goal status: {GOALSTATUS:25110}  4.  *** Baseline:  Goal status: {GOALSTATUS:25110}  5.  *** Baseline:  Goal status: {GOALSTATUS:25110}  6.  *** Baseline:  Goal status: {GOALSTATUS:25110}   PLAN:  PT FREQUENCY: {rehab frequency:25116}  PT DURATION: {rehab duration:25117}  PLANNED INTERVENTIONS: {rehab planned interventions:25118::"Therapeutic exercises","Therapeutic activity","Neuromuscular re-education","Balance training","Gait training","Patient/Family education","Self Care","Joint mobilization"}  PLAN FOR NEXT SESSION: ***   Lanice Shirts, PT 01/14/2022, 3:48 PM

## 2022-01-16 ENCOUNTER — Ambulatory Visit: Payer: Medicare HMO

## 2022-01-19 ENCOUNTER — Other Ambulatory Visit (HOSPITAL_COMMUNITY): Payer: Self-pay

## 2022-01-19 DIAGNOSIS — I10 Essential (primary) hypertension: Secondary | ICD-10-CM | POA: Diagnosis not present

## 2022-01-19 DIAGNOSIS — Z0130 Encounter for examination of blood pressure without abnormal findings: Secondary | ICD-10-CM | POA: Diagnosis not present

## 2022-01-19 DIAGNOSIS — E119 Type 2 diabetes mellitus without complications: Secondary | ICD-10-CM | POA: Diagnosis not present

## 2022-01-19 DIAGNOSIS — J9801 Acute bronchospasm: Secondary | ICD-10-CM | POA: Diagnosis not present

## 2022-01-19 DIAGNOSIS — Z20822 Contact with and (suspected) exposure to covid-19: Secondary | ICD-10-CM | POA: Diagnosis not present

## 2022-01-19 DIAGNOSIS — R059 Cough, unspecified: Secondary | ICD-10-CM | POA: Diagnosis not present

## 2022-01-19 DIAGNOSIS — J029 Acute pharyngitis, unspecified: Secondary | ICD-10-CM | POA: Diagnosis not present

## 2022-01-19 DIAGNOSIS — Z6841 Body Mass Index (BMI) 40.0 and over, adult: Secondary | ICD-10-CM | POA: Diagnosis not present

## 2022-01-19 DIAGNOSIS — J069 Acute upper respiratory infection, unspecified: Secondary | ICD-10-CM | POA: Diagnosis not present

## 2022-01-19 MED ORDER — VITAMIN C 1000 MG PO TABS: 2000.0000 mg | ORAL_TABLET | Freq: Every day | ORAL | 0 refills | Status: DC

## 2022-01-19 MED ORDER — PREDNISONE 10 MG PO TABS
ORAL_TABLET | ORAL | 0 refills | Status: AC
  Filled 2022-01-19: qty 21, 6d supply, fill #0

## 2022-01-19 MED ORDER — ZINC GLUCONATE 50 MG PO TABS: 50.0000 mg | ORAL_TABLET | Freq: Every day | ORAL | 0 refills | Status: DC

## 2022-01-19 MED ORDER — AZITHROMYCIN 250 MG PO TABS
ORAL_TABLET | ORAL | 0 refills | Status: AC
  Filled 2022-01-19: qty 6, 5d supply, fill #0

## 2022-01-19 MED ORDER — VITAMIN D3 50 MCG (2000 UT) PO TABS: 2000.0000 [IU] | ORAL_TABLET | Freq: Every day | ORAL | 0 refills | Status: DC

## 2022-01-19 MED ORDER — BUDESONIDE-FORMOTEROL FUMARATE 160-4.5 MCG/ACT IN AERO
2.0000 | INHALATION_SPRAY | Freq: Two times a day (BID) | RESPIRATORY_TRACT | 0 refills | Status: DC
  Filled 2022-01-19: qty 10.2, 30d supply, fill #0

## 2022-01-23 ENCOUNTER — Ambulatory Visit: Payer: Medicare HMO | Admitting: Physical Therapy

## 2022-01-29 ENCOUNTER — Other Ambulatory Visit: Payer: Self-pay | Admitting: Nurse Practitioner

## 2022-01-29 ENCOUNTER — Other Ambulatory Visit (HOSPITAL_COMMUNITY): Payer: Self-pay

## 2022-01-29 DIAGNOSIS — M170 Bilateral primary osteoarthritis of knee: Secondary | ICD-10-CM

## 2022-01-29 MED ORDER — TRAMADOL HCL 50 MG PO TABS
50.0000 mg | ORAL_TABLET | Freq: Four times a day (QID) | ORAL | 0 refills | Status: DC | PRN
  Filled 2022-01-29: qty 30, 8d supply, fill #0

## 2022-01-29 NOTE — Telephone Encounter (Signed)
Patient last refill 12/03/2021 with no refills. Contract on file and up to date.  Medication pended and sent to Sherrie Mustache, NP

## 2022-01-30 ENCOUNTER — Other Ambulatory Visit (HOSPITAL_COMMUNITY): Payer: Self-pay

## 2022-02-02 NOTE — Therapy (Incomplete)
OUTPATIENT PHYSICAL THERAPY LOWER EXTREMITY EVALUATION   Patient Name: Thomas [name] MRN: [id number] DOB:07-Feb-1951, 71 y.o., male Today's Date: 02/02/2022  END OF SESSION:   Past Medical History:  Diagnosis Date   Acute on chronic diastolic CHF (congestive heart failure) ([place]) 06/01/2017   Arthritis    Cancer ([place]) 2010   Prostate   Chronic kidney disease    Diabetes mellitus    Diabetic neuropathy ([place])    feet   DVT (deep venous thrombosis) ([place])    Genetic testing 06/22/2016   Mr. Cranshaw underwent genetic counseling and testing for hereditary cancer syndromes on 05/14/2016. His results were negative for mutations in all 46 genes analyzed by [facility]'s 46-gene Common Hereditary Cancers Panel. Genes analyzed include: APC, ATM, AXIN2, BARD1, BMPR1A, BRCA1, BRCA2, BRIP1, CDH1, CDKN2A, CHEK2, CTNNA1, DICER1, EPCAM, GREM1, HOXB13, KIT, MEN1, MLH1, MSH2, MSH3, MSH6, MUTYH, NB   GERD (gastroesophageal reflux disease)    Hypertension    PE (pulmonary thromboembolism) ([facility])    Pneumonia    Sleep apnea    not wearing CPAP   Suprapubic catheter ([place]) 11/17/2019   Past Surgical History:  Procedure Laterality Date   CARDIOVERSION N/A 06/03/2017   Procedure: CARDIOVERSION;  Surgeon: Jerline Pain, MD;  Location: [facility];  Service: Cardiovascular;  Laterality: N/A;   CATARACT EXTRACTION Left 07/31/2021   Dr.Glenn, [place], [place]   COLONOSCOPY     COLONOSCOPY WITH PROPOFOL N/A 10/20/2018   Procedure: COLONOSCOPY WITH PROPOFOL;  Surgeon: Thornton Park, MD;  [facility];  Service: Gastroenterology;  Laterality: N/A;   HERNIA REPAIR     KNEE SURGERY     MULTIPLE TOOTH EXTRACTIONS     POLYPECTOMY  10/20/2018   Procedure: POLYPECTOMY;  Surgeon: Thornton Park, MD;  [facility];  Service: Gastroenterology;;   PROSTATE SURGERY     RADIOACTIVE SEED IMPLANT     RIGHT/LEFT HEART CATH AND CORONARY ANGIOGRAPHY N/A 07/06/2017   Procedure:  RIGHT/LEFT HEART CATH AND CORONARY ANGIOGRAPHY;  Surgeon: Jettie Booze, MD;  Location: [facility];  Service: Cardiovascular;  Laterality: N/A;   SHOULDER SURGERY     TOTAL KNEE ARTHROPLASTY Right 11/19/2016   Procedure: RIGHT TOTAL KNEE ARTHROPLASTY;  Surgeon: Leandrew Koyanagi, MD;  Location: [facility];  Service: Orthopedics;  Laterality: Right;   uretha surgery-2014     Patient Active Problem List   Diagnosis Date Noted   Dyspnea 10/19/2017   Anticoagulated 24/26/8341   Chronic systolic heart failure ([place])    Pulmonary hypertension, unspecified ([place])    Chest pain 06/01/2017   New onset atrial fibrillation ([place]) 06/01/2017   CKD (chronic kidney disease), stage III ([place]) 06/01/2017   Acute on chronic diastolic CHF (congestive heart failure) ([place]) 06/01/2017   Atrial fibrillation with RVR ([place]) 05/31/2017   Total knee replacement status 11/19/2016   Meniscal injury 10/09/2016   Genetic testing 06/22/2016   DVT, lower extremity, proximal, acute, left ([place]) 01/23/2016   Hematuria 12/12/2015   Type 2 diabetes mellitus with chronic kidney disease, without long-term current use of insulin ([place]) 12/05/2014   Benign prostatic hyperplasia with urinary obstruction 07/25/2014   Urine retention 07/25/2014   Urinary tract infection 07/09/2014   Anemia, iron deficiency 11/22/2013   Urinary retention 11/01/2013   Incomplete emptying of bladder 07/26/2013   Nocturia 07/26/2013   Other nonspecific finding on examination of urine 07/26/2013   Sleep apnea 07/26/2013   Urethral stricture 07/26/2013   Bladder neck contracture 07/04/2013   Severe obesity (  BMI >= 40) ([place]) 05/23/2013   B12 deficiency 05/23/2013   Hyperlipidemia with target LDL less than 100 09/02/2012   HTN (hypertension) 05/26/2012   Osteoarthritis, knee 05/26/2012   MORBID OBESITY 08/22/2008    PCP: [name], NP  REFERRING PROVIDER: [name], NP  REFERRING DIAG: M17.0 (ICD-10-CM) - Primary  osteoarthritis of both knees R26.9 (ICD-10-CM) - Abnormal gait  THERAPY DIAG:  No diagnosis found.  Rationale for Evaluation and Treatment: Rehabilitation  ONSET DATE: ***  SUBJECTIVE:   SUBJECTIVE STATEMENT: ***  PERTINENT HISTORY: CHF, arthritis, hx prostate cancer, DM + neuropathy, DVT/PE, GERD, HTN, Afib RVR, CKD3 PAIN:  Are you having pain? {OPRCPAIN:27236}  PRECAUTIONS: {Therapy precautions:24002}  WEIGHT BEARING RESTRICTIONS: {Yes ***/No:24003}  FALLS:  Has patient fallen in last 6 months? {fallsyesno:27318}  LIVING ENVIRONMENT: Lives with: {[facility] lives with:25569::"lives with their family"} Lives in: {Lives in:25570} Stairs: {opstairs:27293} Has following equipment at home: {Assistive devices:23999}  OCCUPATION: ***  PLOF: {PLOF:24004}  PATIENT GOALS: ***  NEXT MD VISIT:   OBJECTIVE:   DIAGNOSTIC FINDINGS: no recent imaging  PATIENT SURVEYS:  FOTO ***  COGNITION: Overall cognitive status: Within functional limits for tasks assessed     SENSATION: Light touch intact BLE, pt w/ hx of neuropathy ***   EDEMA:  {edema:24020}  MUSCLE LENGTH: Hamstrings: Right *** deg; Left *** deg Thomas test: Right *** deg; Left *** deg  POSTURE: {posture:25561}  PALPATION: ***  LOWER EXTREMITY ROM:  {AROM/PROM:27142}  Right eval Left eval  Hip flexion    Hip extension    Hip internal rotation    Hip external rotation     (Blank rows = not tested)  Comments:    LOWER EXTREMITY MMT:    MMT Right eval Left eval  Hip flexion    Hip abduction (modified sitting)    Hip internal rotation    Hip external rotation    Knee flexion    Knee extension     (Blank rows = not tested)  Comments:   LOWER EXTREMITY SPECIAL TESTS:  {LEspecialtests:26242}  FUNCTIONAL TESTS:  {Functional tests:24029}  GAIT: Distance walked: *** Assistive device utilized: {Assistive devices:23999} Level of assistance: {Levels of assistance:24026} Comments:  ***   TODAY'S TREATMENT:                                                                                                                              [facility] Adult PT Treatment:                                                DATE: 02/03/22 Therapeutic Exercise: *** Manual Therapy: *** Neuromuscular re-ed: *** Therapeutic Activity: *** Modalities: *** Self Care: ***   PATIENT EDUCATION:  Education details: Pt education on PT impairments, prognosis, and POC. Informed consent. Rationale for interventions, safe/appropriate HEP performance  Person educated: Patient Education method: Explanation, Demonstration, Tactile cues, Verbal cues, and Handouts Education comprehension: verbalized understanding, returned demonstration, verbal cues required, tactile cues required, and needs further education    HOME EXERCISE PROGRAM: ***  ASSESSMENT:  CLINICAL IMPRESSION: Patient is a 71 y.o. gentleman who was seen today for physical therapy evaluation and treatment for ***.   OBJECTIVE IMPAIRMENTS: {opptimpairments:25111}.   ACTIVITY LIMITATIONS: {activitylimitations:27494}  PARTICIPATION LIMITATIONS: {participationrestrictions:25113}  PERSONAL FACTORS: {Personal factors:25162} are also affecting patient's functional outcome.   REHAB POTENTIAL: {rehabpotential:25112}  CLINICAL DECISION MAKING: {clinical decision making:25114}  EVALUATION COMPLEXITY: {Evaluation complexity:25115}   GOALS: Goals reviewed with patient? {yes/no:20286}  SHORT TERM GOALS: Target date: *** Pt will demonstrate appropriate understanding and performance of initially prescribed HEP in order to facilitate improved independence with management of symptoms.  Baseline: HEP provided on eval Goal status: INITIAL   2. Pt will score greater than or equal to *** on FOTO in order to demonstrate improved perception of function due to symptoms.  Baseline: ***  Goal status: INITIAL   LONG TERM GOALS: Target date:  {follow up:25551}   Pt will score *** or greater on FOTO in order to demonstrate improved perception of function due to symptoms.  Baseline: *** Goal status: INITIAL  2.  Pt will demonstrate at least *** degrees of *** AROM in order to facilitate improved tolerance to functional movements such as ***.  Baseline: see ROM chart above Goal status: INITIAL  3.  Pt will be able to lift up to *** in order to demonstrate improved capacity for daily activities such as ***.  Baseline: *** Goal status: INITIAL  4.  Pt will be able to perform 5xSTS in less than or equal to *** in order to demonstrate reduced fall risk and improved functional independence (MCID 5xSTS = 2.3 sec). Baseline: *** Goal status: INITIAL    PLAN:  PT FREQUENCY: {rehab frequency:25116}  PT DURATION: {rehab duration:25117}  PLANNED INTERVENTIONS: {rehab planned interventions:25118::"Therapeutic exercises","Therapeutic activity","Neuromuscular re-education","Balance training","Gait training","Patient/Family education","Self Care","Joint mobilization"}  PLAN FOR NEXT SESSION: ***  Leeroy Cha PT, DPT 02/02/2022 12:58 PM

## 2022-02-03 ENCOUNTER — Ambulatory Visit: Payer: Medicare HMO | Attending: Nurse Practitioner | Admitting: Physical Therapy

## 2022-02-04 DIAGNOSIS — Z466 Encounter for fitting and adjustment of urinary device: Secondary | ICD-10-CM | POA: Diagnosis not present

## 2022-02-04 DIAGNOSIS — R339 Retention of urine, unspecified: Secondary | ICD-10-CM | POA: Diagnosis not present

## 2022-02-06 ENCOUNTER — Encounter: Payer: Self-pay | Admitting: Adult Health

## 2022-02-06 ENCOUNTER — Telehealth (INDEPENDENT_AMBULATORY_CARE_PROVIDER_SITE_OTHER): Payer: Medicare HMO | Admitting: Adult Health

## 2022-02-06 VITALS — Ht 69.0 in | Wt 330.0 lb

## 2022-02-06 DIAGNOSIS — I825Y2 Chronic embolism and thrombosis of unspecified deep veins of left proximal lower extremity: Secondary | ICD-10-CM | POA: Diagnosis not present

## 2022-02-06 DIAGNOSIS — I10 Essential (primary) hypertension: Secondary | ICD-10-CM

## 2022-02-06 DIAGNOSIS — J9601 Acute respiratory failure with hypoxia: Secondary | ICD-10-CM | POA: Diagnosis not present

## 2022-02-06 DIAGNOSIS — I5022 Chronic systolic (congestive) heart failure: Secondary | ICD-10-CM | POA: Diagnosis not present

## 2022-02-06 NOTE — Progress Notes (Signed)
This service is provided via telemedicine  No vital signs collected/recorded due to the encounter was [name] telemedicine visit.   Location of patient (ex: home, work):  home  Patient consents to [name] video visit:  see consent dated 06/27/2021  Location of the provider (ex: office, home):  [facility]  Names of all persons participating in the telemedicine service and their role in the encounter:  patient, Thomas [name] [facility], Durenda Age, NP   Time spent on call:  5 minutes     DATE:  02/06/2022 MRN:  [id number]  BIRTHDAY: 11/19/1950   Contact Information     Name [name] [phone]  [phone]   Thomas [name] Daughter [phone]     Thomas [name] Daughter [phone]          Code Status History     Date Active Date Inactive Code Status Order ID Comments User Context   07/06/2017 0834 07/06/2017 1526 Full Code [id number]  Jettie Booze, MD Inpatient   06/01/2017 0143 06/03/2017 1620 Full Code [id number]  Ivor Costa, MD Inpatient   11/19/2016 1951 11/21/2016 1356 Full Code [id number]  Leandrew Koyanagi, MD Inpatient   10/09/2016 1647 10/11/2016 1550 Full Code [id number]  Ladene Artist., MD ED   11/23/2015 2058 11/26/2015 1735 Full Code [id number]  Gennaro Africa, MD Inpatient        Chief Complaint  Patient presents with   Acute Visit    Patient is here for residual effects of positive covid 19 and difficulty breathing  Referral for CX    HISTORY OF PRESENT ILLNESS: This is [name] 71 year old male who had video visit today for residual effects of COVID-19 infection.  He had COVID-19 infection 2 weeks ago and completed Z-pak and Paxlovid. However, he complains of having shortness of breath at night. O2 saturation ranges from 89% to 90%. He occasionally uses O2 @ 2L/min. He hears himself wheeze even at rest and at night. He has productive cough with whitish phlegm. He currently takes  Lasix for CHF. He has [name] history of LLE DVT for which he takes Xarelto. Bps at home were 149/80 and 150/80. He takes Amlodipine, Carvedilol, Losartan and Hydralazine for hypertension.   PAST MEDICAL HISTORY:  Past Medical History:  Diagnosis Date   Acute on chronic diastolic CHF (congestive heart failure) ([place]) 06/01/2017   Arthritis    Cancer ([place]) 2010   Prostate   Chronic kidney disease    Diabetes mellitus    Diabetic neuropathy ([place])    feet   DVT (deep venous thrombosis) ([place])    Genetic testing 06/22/2016   Thomas [name] underwent genetic counseling and testing for hereditary cancer syndromes on 05/14/2016. His results were negative for mutations in all 46 genes analyzed by [facility]'s 46-gene Common Hereditary Cancers Panel. Genes analyzed include: APC, ATM, AXIN2, BARD1, BMPR1A, BRCA1, BRCA2, BRIP1, CDH1, CDKN2A, CHEK2, CTNNA1, DICER1, EPCAM, GREM1, HOXB13, KIT, MEN1, MLH1, MSH2, MSH3, MSH6, MUTYH, NB   GERD (gastroesophageal reflux disease)    Hypertension    PE (pulmonary thromboembolism) (HCC)    Pneumonia    Sleep apnea    not wearing CPAP   Suprapubic catheter ([place]) 11/17/2019     CURRENT MEDICATIONS: Reviewed  Patient's Medications  New Prescriptions   No medications on file  Previous Medications   ACCU-CHEK SOFTCLIX LANCETS LANCETS    Use to test blood sugar daily   ALCOHOL SWABS (B-D SINGLE USE SWABS REGULAR) PADS  Use in testing blood sugar daily. Dx: E11.22   AMLODIPINE (NORVASC) 10 MG TABLET    TAKE 1 TABLET EVERY DAY FOR HIGH BLOOD PRESSURE   ASCORBIC ACID (VITAMIN C) 1000 MG TABLET    Take 2 tablets (2,000 mg total) by mouth daily.   ASCORBIC ACID (VITAMIN C) 500 MG TABLET    Take 1 tablet (500 mg total) by mouth daily.   ASHWAGANDHA PO    Take 1 tablet by mouth in the morning and at bedtime. Brand: Goli, gummy   ASPIRIN EC 81 MG TABLET    Take 81 mg by mouth daily.   ATORVASTATIN (LIPITOR) 40 MG TABLET    Take 1 tablet (40 mg total) by mouth daily.   BLOOD  GLUCOSE CALIBRATION (ACCU-CHEK AVIVA) SOLN    Use once daily as directed dx E11.22   BLOOD GLUCOSE MONITORING SUPPL (TRUE METRIX AIR GLUCOSE METER) W/DEVICE KIT    1 Device by Does not apply route daily as needed. E11.22   BUDESONIDE-FORMOTEROL (SYMBICORT) 160-4.5 MCG/ACT INHALER    Inhale 2 puffs into the lungs 2 (two) times daily.   CARVEDILOL (COREG) 12.5 MG TABLET    TAKE 1 TABLET TWICE DAILY WITH MEALS   CHOLECALCIFEROL (VITAMIN D3) 50 MCG (2000 UT) TABS    Take 1 tablet (2,000 Units) by mouth daily.   EMPAGLIFLOZIN (JARDIANCE) 10 MG TABS TABLET    Take 1 tablet (10 mg total) by mouth daily.   FUROSEMIDE (LASIX) 40 MG TABLET    TAKE 1 TABLET (40 MG TOTAL) BY MOUTH DAILY.   GLIPIZIDE (GLUCOTROL) 5 MG TABLET    TAKE 1 TABLET EVERY DAY BEFORE BREAKFAST   GLUCOSE BLOOD (ACCU-CHEK AVIVA PLUS) TEST STRIP    Use to test blood sugar daily.   HYDRALAZINE (APRESOLINE) 25 MG TABLET    TAKE 3 TABLETS (75MG) THREE TIMES DAILY   LANCETS MISC. (ACCU-CHEK SOFTCLIX LANCET DEV) KIT    Use to test blood sugar daily   LOSARTAN (COZAAR) 50 MG TABLET    TAKE 1 TABLET (50 MG TOTAL) BY MOUTH DAILY.   METFORMIN (GLUCOPHAGE) 500 MG TABLET    TAKE 1 TABLET TWICE DAILY WITH MEALS   METHOCARBAMOL (ROBAXIN) 500 MG TABLET    Take 1 tablet (500 mg total) by mouth every 8 (eight) hours as needed for muscle spasms.   MISC NATURAL PRODUCTS (SUPER GREENS PO)    Take 1 tablet by mouth 2 (two) times daily. Brand: Goli, gummy   POTASSIUM CHLORIDE SA (KLOR-CON M) 20 MEQ TABLET    TAKE 1 TABLET TWICE DAILY   RIVAROXABAN (XARELTO) 20 MG TABS TABLET    Take 1 tablet (20 mg total) by mouth daily with supper.   SENNA 8.7 MG CHEW    Chew 1 tablet by mouth as needed.   TRAMADOL (ULTRAM) 50 MG TABLET    Take 1 tablet (50 mg total) by mouth every 6 (six) hours as needed for pain   TRUEPLUS LANCETS 28G MISC    1 Device by Other route daily as needed. DX:E11.22   UNABLE TO FIND    Take 1 tablet by mouth daily. Med Name: Cinnachroma   UNABLE  TO FIND    Med Name: Goli Beet supplement 1 by mouth 2 times daily   ZINC GLUCONATE 50 MG TABLET    Take 1 tablet (50 mg total) by mouth daily.   ZINC GLUCONATE 50 MG TABLET    Take 1 tablet (50 mg total) by mouth daily.  Modified Medications   No medications on file  Discontinued Medications   No medications on file     Allergies  Allergen Reactions   Lisinopril Cough     REVIEW OF SYSTEMS:  GENERAL: no change in appetite, no fatigue, no weight changes, no fever, chills or weakness SKIN: Denies rash, itching, wounds, ulcer sores, or nail abnormality EYES: Denies change in vision, dry eyes, eye pain, itching or discharge EARS: Denies change in hearing, ringing in ears, or earache NOSE: Denies nasal congestion or epistaxis MOUTH and THROAT: Denies oral discomfort, gingival pain or bleeding, pain from teeth or hoarseness   RESPIRATORY: + cough, SOB  and wheezing CARDIAC: no chest pain, edema or palpitations GI: no abdominal pain, diarrhea, constipation, heart burn, nausea or vomiting GU: has suprapubic catheter NEUROLOGICAL: Denies dizziness, syncope, numbness, or headache PSYCHIATRIC: Denies feeling of depression or anxiety. No report of hallucinations, insomnia, paranoia, or agitation ENDOCRINE: Denies polyphagia, polyuria, polydipsia, heat or cold intolerance   LABS/RADIOLOGY: Labs reviewed: Basic Metabolic Panel: Recent Labs    05/08/21 1815 05/19/21 1029 09/29/21 1018  NA 142 146 144  K 3.3* 3.8 3.4*  CL 108 110 108  CO2 _0 GLUCOSE 144* 158* 202*  BUN _1 CREATININE 1.05 0.99 1.16  CALCIUM 9.0 8.6 8.8  MG 1.9  --   --    Liver Function Tests: Recent Labs    05/08/21 1815 09/29/21 1018  AST 17 8*  ALT 17 13  ALKPHOS 62  --   BILITOT 0.8 0.7  PROT 7.3 6.8  ALBUMIN 3.7  --    No results for input(s): "LIPASE", "AMYLASE" in the last 8760 hours. No results for input(s): "AMMONIA" in the last 8760 hours. CBC: Recent Labs    05/08/21 1815  09/29/21 1018  WBC 8.2 5.9  NEUTROABS 6.2 4,372  HGB 13.5 13.6  HCT 43.1 41.8  MCV 88.5 86.9  PLT 248 254   A1C: Invalid input(s): "A1C" Lipid Panel: Recent Labs    05/19/21 1029  HDL 52   Cardiac Enzymes: No results for input(s): "CKTOTAL", "CKMB", "CKMBINDEX", "TROPONINI" in the last 8760 hours. BNP: Invalid input(s): "POCBNP" CBG: No results for input(s): "GLUCAP" in the last 8760 hours.    No results found.  ASSESSMENT/PLAN:  1. Acute respiratory failure with hypoxia (HCC) -  O2 sat at home 89-90% -   uses O2 at 2L/min occasionally - DG Chest 2 View; Future -  stated that he cannot go for chest x-ray today and does not have shortness of breath at this time  2. Chronic systolic heart failure (HCC) -  continue Lasix  3. Chronic deep vein thrombosis (DVT) of proximal vein of left lower extremity (HCC) -  continue Xarelto  4. Primary hypertension -  continue current medications -  instructed to check BP, log and bring to next appointment     Time spent on non face to face visit:  18 minutes  The patient gave consent to this video visit. Explained to the patient the risk and privacy issue that was involved with this video call.   The patient was advised to call back and ask for an in-person evaluation if the symptoms worsen or if the condition fails to improve.   Durenda Age, NP [employer] [phone]

## 2022-02-08 ENCOUNTER — Other Ambulatory Visit: Payer: Self-pay | Admitting: Nurse Practitioner

## 2022-02-08 DIAGNOSIS — I10 Essential (primary) hypertension: Secondary | ICD-10-CM

## 2022-02-10 NOTE — Telephone Encounter (Signed)
Warning came up when trying to fill medication.  Medication pended and sent to [name], NP (covering provider)

## 2022-02-11 ENCOUNTER — Ambulatory Visit
Admission: RE | Admit: 2022-02-11 | Discharge: 2022-02-11 | Disposition: A | Discharge status: Home / Self Care | Payer: Medicare HMO | Source: Ambulatory Visit | Attending: Adult Health | Admitting: Adult Health

## 2022-02-11 DIAGNOSIS — J9601 Acute respiratory failure with hypoxia: Secondary | ICD-10-CM

## 2022-02-11 DIAGNOSIS — J811 Chronic pulmonary edema: Secondary | ICD-10-CM | POA: Diagnosis not present

## 2022-02-11 DIAGNOSIS — R0602 Shortness of breath: Secondary | ICD-10-CM | POA: Diagnosis not present

## 2022-02-12 ENCOUNTER — Telehealth: Payer: Self-pay | Admitting: *Deleted

## 2022-02-12 NOTE — Telephone Encounter (Signed)
Patient called requesting Chest X-Ray Results and wanting to know if he needs to be on Medication. Stated that he had the X-Ray yesterday.   Please Advise.

## 2022-02-13 ENCOUNTER — Other Ambulatory Visit (HOSPITAL_COMMUNITY): Payer: Self-pay

## 2022-02-13 ENCOUNTER — Other Ambulatory Visit: Payer: Self-pay | Admitting: Adult Health

## 2022-02-13 DIAGNOSIS — R339 Retention of urine, unspecified: Secondary | ICD-10-CM | POA: Diagnosis not present

## 2022-02-13 MED ORDER — DOXYCYCLINE HYCLATE 100 MG PO TABS
100.0000 mg | ORAL_TABLET | Freq: Two times a day (BID) | ORAL | 0 refills | Status: AC
  Filled 2022-02-13: qty 14, 7d supply, fill #0

## 2022-02-13 NOTE — Telephone Encounter (Signed)
Patient called back requesting results of chest xray.  Message routed to Sherrie Mustache, NP

## 2022-02-13 NOTE — Telephone Encounter (Signed)
Durenda Age, NP (ordering provider) is out of office today. Patient would like to have result before the weekend.

## 2022-02-13 NOTE — Telephone Encounter (Signed)
Thomas [name] has sent in doxycycline for possible pneumonia.

## 2022-02-13 NOTE — Progress Notes (Signed)
I have ordered Doxycycline and sent it to [facility] for his pneumonia.

## 2022-02-25 ENCOUNTER — Other Ambulatory Visit: Payer: Self-pay | Admitting: Nurse Practitioner

## 2022-03-09 DIAGNOSIS — R339 Retention of urine, unspecified: Secondary | ICD-10-CM | POA: Diagnosis not present

## 2022-03-09 DIAGNOSIS — Z466 Encounter for fitting and adjustment of urinary device: Secondary | ICD-10-CM | POA: Diagnosis not present

## 2022-03-16 DIAGNOSIS — R339 Retention of urine, unspecified: Secondary | ICD-10-CM | POA: Diagnosis not present

## 2022-03-23 ENCOUNTER — Emergency Department (HOSPITAL_COMMUNITY)
Admission: EM | Admit: 2022-03-23 | Discharge: 2022-03-23 | Disposition: A | Discharge status: Home / Self Care | Payer: Medicare HMO | Attending: Emergency Medicine | Admitting: Emergency Medicine

## 2022-03-23 ENCOUNTER — Emergency Department (HOSPITAL_COMMUNITY): Payer: Medicare HMO

## 2022-03-23 DIAGNOSIS — Z1152 Encounter for screening for COVID-19: Secondary | ICD-10-CM | POA: Insufficient documentation

## 2022-03-23 DIAGNOSIS — Z79899 Other long term (current) drug therapy: Secondary | ICD-10-CM | POA: Diagnosis not present

## 2022-03-23 DIAGNOSIS — E876 Hypokalemia: Secondary | ICD-10-CM | POA: Insufficient documentation

## 2022-03-23 DIAGNOSIS — Z7984 Long term (current) use of oral hypoglycemic drugs: Secondary | ICD-10-CM | POA: Diagnosis not present

## 2022-03-23 DIAGNOSIS — I110 Hypertensive heart disease with heart failure: Secondary | ICD-10-CM | POA: Insufficient documentation

## 2022-03-23 DIAGNOSIS — R739 Hyperglycemia, unspecified: Secondary | ICD-10-CM

## 2022-03-23 DIAGNOSIS — I509 Heart failure, unspecified: Secondary | ICD-10-CM | POA: Insufficient documentation

## 2022-03-23 DIAGNOSIS — I2510 Atherosclerotic heart disease of native coronary artery without angina pectoris: Secondary | ICD-10-CM | POA: Diagnosis not present

## 2022-03-23 DIAGNOSIS — Z7982 Long term (current) use of aspirin: Secondary | ICD-10-CM | POA: Diagnosis not present

## 2022-03-23 DIAGNOSIS — E1165 Type 2 diabetes mellitus with hyperglycemia: Secondary | ICD-10-CM | POA: Insufficient documentation

## 2022-03-23 DIAGNOSIS — Z7901 Long term (current) use of anticoagulants: Secondary | ICD-10-CM | POA: Diagnosis not present

## 2022-03-23 DIAGNOSIS — R0602 Shortness of breath: Secondary | ICD-10-CM | POA: Diagnosis not present

## 2022-03-23 LAB — CBC WITH DIFFERENTIAL/PLATELET
Abs Immature Granulocytes: 0.01 10*3/uL (ref 0.00–0.07)
Basophils Absolute: 0 10*3/uL (ref 0.0–0.1)
Basophils Relative: 1 %
Eosinophils Absolute: 0.1 10*3/uL (ref 0.0–0.5)
Eosinophils Relative: 2 %
HCT: 43.8 % (ref 39.0–52.0)
Hemoglobin: 13.6 g/dL (ref 13.0–17.0)
Immature Granulocytes: 0 %
Lymphocytes Relative: 13 %
Lymphs Abs: 0.8 10*3/uL (ref 0.7–4.0)
MCH: 27 pg (ref 26.0–34.0)
MCHC: 31.1 g/dL (ref 30.0–36.0)
MCV: 87.1 fL (ref 80.0–100.0)
Monocytes Absolute: 0.4 10*3/uL (ref 0.1–1.0)
Monocytes Relative: 7 %
Neutro Abs: 5 10*3/uL (ref 1.7–7.7)
Neutrophils Relative %: 77 %
Platelets: 222 10*3/uL (ref 150–400)
RBC: 5.03 MIL/uL (ref 4.22–5.81)
RDW: 15.9 % — ABNORMAL HIGH (ref 11.5–15.5)
WBC: 6.4 10*3/uL (ref 4.0–10.5)
nRBC: 0 % (ref 0.0–0.2)

## 2022-03-23 LAB — BASIC METABOLIC PANEL
Anion gap: 12 (ref 5–15)
BUN: 19 mg/dL (ref 8–23)
CO2: 26 mmol/L (ref 22–32)
Calcium: 8.6 mg/dL — ABNORMAL LOW (ref 8.9–10.3)
Chloride: 104 mmol/L (ref 98–111)
Creatinine, Ser: 1.11 mg/dL (ref 0.61–1.24)
GFR, Estimated: >60 mL/min (ref >60)
Glucose, Bld: 301 mg/dL — ABNORMAL HIGH (ref 70–99)
Potassium: 3.2 mmol/L — ABNORMAL LOW (ref 3.5–5.1)
Sodium: 142 mmol/L (ref 135–145)

## 2022-03-23 LAB — RESP PANEL BY RT-PCR (RSV, FLU A&B, COVID)  RVPGX2
Influenza A by PCR: NEGATIVE
Influenza B by PCR: NEGATIVE
Resp Syncytial Virus by PCR: NEGATIVE
SARS Coronavirus 2 by RT PCR: NEGATIVE

## 2022-03-23 LAB — BRAIN NATRIURETIC PEPTIDE: B Natriuretic Peptide: 458.1 pg/mL — ABNORMAL HIGH (ref 0.0–100.0)

## 2022-03-23 LAB — D-DIMER, QUANTITATIVE: D-Dimer, Quant: 0.87 ug/mL-FEU — ABNORMAL HIGH (ref 0.00–0.50)

## 2022-03-23 MED ORDER — IOHEXOL 350 MG/ML SOLN
75.0000 mL | Freq: Once | INTRAVENOUS | Status: AC | PRN
  Administered 2022-03-23: 75 mL via INTRAVENOUS

## 2022-03-23 MED ORDER — FUROSEMIDE 10 MG/ML IJ SOLN
40.0000 mg | Freq: Once | INTRAMUSCULAR | Status: AC
  Administered 2022-03-23: 40 mg via INTRAVENOUS
  Filled 2022-03-23: qty 4

## 2022-03-23 MED ORDER — SODIUM CHLORIDE (PF) 0.9 % IJ SOLN
INTRAMUSCULAR | Status: AC
  Filled 2022-03-23: qty 50

## 2022-03-23 NOTE — ED Triage Notes (Signed)
Pt c/o worsening foot swelling and SOB. Pt states he's been having to use his PRN oxygen at home more frequently and sleep sitting up. Denies any CP at this time.

## 2022-03-23 NOTE — Discharge Instructions (Addendum)
For the next 5 days, beginning TOMORROW on 03/24/21, I recommend you increase your morning Lasix dose from 40 mg to 80 mg, and then continue with 40 mg of Lasix at night.  This is only for the next 5 days.  After that you can go back to your normal dosing of 40 mg in the morning and 40 mg at night.  Your blood sugar was also mildly high and your potassium was mildly low today.  You should contact your doctors office to follow-up for this issue.  Your blood sugars may need better control and your PCP office should manage this.  For your foot concerns:  [email or web address], [place], [place] [address]  ~1.1 mi [phone]

## 2022-03-23 NOTE — ED Provider Notes (Signed)
[facility] Provider Note   CSN: [id number] Arrival date & time: 03/23/22  2706     History  Chief Complaint  Patient presents with   Shortness of Breath    Thomas [name] is a 72 y.o. male w/ CHF  (EF 45-50% on April 2019), HTN, diabetes, here with shortness of breath.  Patient reports gradually progressing shortness of breath for about 1 to 2 weeks.  He does feel that his feet are swelling up.  He has chronic dyspnea on exertion, limited mobility due to bad knees, and is typically in a wheelchair, except for pivoting and getting around.  He denies any persistent or worsening cough or any fevers at home.  He has been compliant with all his medications including his diuretic, Lasix 40 mg twice daily.  He denies any chest pain or pressure.  He is on Xarelto and compliant with this for history of DVT.  Denies history of PE.   LHC May 2019:  There is mild left ventricular systolic dysfunction. LV end diastolic pressure is moderately elevated, 30 mm Hg. The left ventricular ejection fraction is 45-50% by visual estimate. There is no aortic valve stenosis. Hemodynamic findings consistent with moderate to severe pulmonary hypertension. CO 10.8 L/min; CI 4.2, Ao sat 91%; PA sat 72%; mean PCWP 21 mm Hg Nonobstructive CAD. Prox Cx lesion is 25% stenosed.   Non-obstructive CAD.  Moderate to severe pulmonary hypertension.     Will need management for pulmonary hypertension and diuresis.  Plan to increase Lasix dose starting tomorrow with f/u labs to be ordered.   HPI     Home Medications Prior to Admission medications   Medication Sig Start Date End Date Taking? Authorizing Provider  Accu-Chek Softclix Lancets lancets Use to test blood sugar daily 03/18/21   [name], NP  Alcohol Swabs (B-D SINGLE USE SWABS REGULAR) PADS Use in testing blood sugar daily. Dx: E11.22 05/20/21   [name], NP  amLODipine (NORVASC) 10 MG  tablet TAKE 1 TABLET EVERY DAY FOR HIGH BLOOD PRESSURE 07/21/21   [name], NP  Ascorbic Acid (VITAMIN C) 1000 MG tablet Take 2 tablets (2,000 mg total) by mouth daily. 01/19/22     ascorbic acid (VITAMIN C) 500 MG tablet Take 1 tablet (500 mg total) by mouth daily. 11/18/21   Ngetich, Dinah C, NP  ASHWAGANDHA PO Take 1 tablet by mouth in the morning and at bedtime. Brand: Goli, gummy    [provider]  aspirin EC 81 MG tablet Take 81 mg by mouth daily.    [provider]  atorvastatin (LIPITOR) 40 MG tablet TAKE 1 TABLET EVERY DAY 02/25/22   [name], NP  Blood Glucose Calibration (ACCU-CHEK AVIVA) SOLN Use once daily as directed dx E11.22 08/25/19   [name], NP  Blood Glucose Monitoring Suppl (TRUE METRIX AIR GLUCOSE METER) w/Device KIT 1 Device by Does not apply route daily as needed. E11.22 05/20/21   [name], NP  budesonide-formoterol (SYMBICORT) 160-4.5 MCG/ACT inhaler Inhale 2 puffs into the lungs 2 (two) times daily. 01/19/22     carvedilol (COREG) 12.5 MG tablet TAKE 1 TABLET TWICE DAILY WITH MEALS 09/12/21   [name], NP  Cholecalciferol (VITAMIN D3) 50 MCG (2000 UT) TABS Take 1 tablet (2,000 Units) by mouth daily. 01/19/22     empagliflozin (JARDIANCE) 10 MG TABS tablet Take 1 tablet (10 mg total) by mouth daily. 08/18/21  [name], NP  furosemide (LASIX) 40 MG tablet TAKE 1 TABLET (40 MG TOTAL) BY MOUTH DAILY. 09/12/21   [name], NP  glipiZIDE (GLUCOTROL) 5 MG tablet TAKE 1 TABLET EVERY DAY BEFORE BREAKFAST 04/02/21   [name], NP  glucose blood (ACCU-CHEK AVIVA PLUS) test strip Use to test blood sugar daily. 03/17/21   [name], NP  hydrALAZINE (APRESOLINE) 25 MG tablet TAKE 3 TABLETS THREE TIMES DAILY 02/10/22   Ngetich, Dinah C, NP  Lancets Misc. (ACCU-CHEK SOFTCLIX LANCET DEV) KIT Use to test blood sugar daily 03/18/21   [name], NP  losartan (COZAAR) 50 MG tablet TAKE 1 TABLET  (50 MG TOTAL) BY MOUTH DAILY. 10/21/21   [name], NP  metFORMIN (GLUCOPHAGE) 500 MG tablet TAKE 1 TABLET TWICE DAILY WITH MEALS 09/12/21   [name], NP  methocarbamol (ROBAXIN) 500 MG tablet Take 1 tablet (500 mg total) by mouth every 8 (eight) hours as needed for muscle spasms. 09/29/21   [name], NP  Misc Natural Products (SUPER GREENS PO) Take 1 tablet by mouth 2 (two) times daily. Brand: Goli, gummy    [provider]  potassium chloride SA (KLOR-CON M) 20 MEQ tablet TAKE 1 TABLET TWICE DAILY 10/21/21   [name], NP  rivaroxaban (XARELTO) 20 MG TABS tablet Take 1 tablet (20 mg total) by mouth daily with supper. 08/18/21   [name], NP  Senna 8.7 MG CHEW Chew 1 tablet by mouth as needed. 05/20/21   [name], NP  traMADol (ULTRAM) 50 MG tablet Take 1 tablet (50 mg total) by mouth every 6 (six) hours as needed for pain 01/29/22   [name], NP  TRUEplus Lancets 28G MISC 1 Device by Other route daily as needed. DX:E11.22 10/21/21   [name], NP  UNABLE TO FIND Take 1 tablet by mouth daily. Med Name: Cinnachroma    [provider]  UNABLE TO FIND Med Name: Goli Beet supplement 1 by mouth 2 times daily    [provider]  zinc gluconate 50 MG tablet Take 1 tablet (50 mg total) by mouth daily. 11/18/21   Ngetich, Dinah C, NP  zinc gluconate 50 MG tablet Take 1 tablet (50 mg total) by mouth daily. 01/19/22         Allergies    Lisinopril    Review of Systems   Review of Systems  Physical Exam Updated Vital Signs BP (!) 187/89   Pulse 69   Temp 97.9 F (36.6 C) (Oral)   Resp 20   SpO2 94%  Physical Exam Constitutional:      General: He is not in acute distress.    Appearance: He is obese.  HENT:     Head: Normocephalic and atraumatic.  Eyes:     Conjunctiva/sclera: Conjunctivae normal.     Pupils: Pupils are equal, round, and reactive to light.  Cardiovascular:     Rate and Rhythm: Normal  rate and regular rhythm.  Pulmonary:     Effort: Pulmonary effort is normal. No respiratory distress.     Comments: Patient is breathing heavily, respiratory rate 25 breaths/min To auscultate lung sounds due to body habitus, this and air movement bilaterally Abdominal:     General: There is no distension.     Tenderness: There is no abdominal tenderness.  Musculoskeletal:     Comments: There is some pitting edema of the lower extremities symmetrical bilaterally  Skin:    General: Skin is warm and dry.  Neurological:     General: No focal deficit present.     Mental Status: He is alert. Mental status is at baseline.  Psychiatric:        Mood and Affect: Mood normal.        Behavior: Behavior normal.     ED Results / Procedures / Treatments   Labs (all labs ordered are listed, but only abnormal results are displayed) Labs Reviewed  BASIC METABOLIC PANEL - Abnormal; Notable for the following components:      Result Value   Potassium 3.2 (*)    Glucose, Bld 301 (*)    Calcium 8.6 (*)    All other components within normal limits  CBC WITH DIFFERENTIAL/PLATELET - Abnormal; Notable for the following components:   RDW 15.9 (*)    All other components within normal limits  BRAIN NATRIURETIC PEPTIDE - Abnormal; Notable for the following components:   B Natriuretic Peptide 458.1 (*)    All other components within normal limits  D-DIMER, QUANTITATIVE - Abnormal; Notable for the following components:   D-Dimer, Quant 0.87 (*)    All other components within normal limits  RESP PANEL BY RT-PCR (RSV, FLU A&B, COVID)  [id number]    EKG EKG Interpretation  Date/Time:  Monday March 23 2022 09:09:35 EST Ventricular Rate:  66 PR Interval:  177 QRS Duration: 101 QT Interval:  474 QTC Calculation: 497 R Axis:   26 Text Interpretation: Sinus rhythm Borderline prolonged QT interval Confirmed by Octaviano Glow [phone]) on 03/23/2022 9:21:20 AM  Radiology CT Angio Chest PE W and/or Wo  Contrast  Result Date: 03/23/2022 CLINICAL DATA:  Shortness of breath. EXAM: CT ANGIOGRAPHY CHEST WITH CONTRAST TECHNIQUE: Multidetector CT imaging of the chest was performed using the standard protocol during bolus administration of intravenous contrast. Multiplanar CT image reconstructions and MIPs were obtained to evaluate the vascular anatomy. RADIATION DOSE REDUCTION: This exam was performed according to the departmental dose-optimization program which includes automated exposure control, adjustment of the mA and/or kV according to patient size and/or use of iterative reconstruction technique. CONTRAST:  42m OMNIPAQUE IOHEXOL 350 MG/ML SOLN COMPARISON:  04/02/2017. FINDINGS: Cardiovascular: Image quality is degraded by respiratory motion. No pulmonary embolus. Atherosclerotic calcification of the aorta and coronary arteries. Enlarged pulmonic trunk and heart. No pericardial effusion. Mediastinum/Nodes: Mediastinal lymph nodes measure up to 9 mm in the low right paratracheal station, increased from 4 mm on 04/02/2017. No hilar or axillary adenopathy. Esophagus is grossly unremarkable. Lungs/Pleura: Image quality is degraded by expiratory phase imaging and respiratory motion. Minimal dependent atelectasis in the left lower lobe. No pleural fluid. Airway is otherwise unremarkable. Upper Abdomen: Liver is grossly unremarkable. Large stone in the gallbladder. Right adrenal gland is unremarkable. 2.5 cm left adrenal nodule measures 21 Hounsfield units and is unchanged from 04/02/2017. No specific follow-up necessary. Visualized portions of the kidneys, spleen, pancreas, stomach and bowel are grossly unremarkable. No upper abdominal adenopathy. Musculoskeletal: Degenerative changes in the spine. No worrisome lytic or sclerotic lesions. Review of the MIP images confirms the above findings. IMPRESSION: 1. Image quality is degraded by respiratory motion and expiratory phase imaging. Negative for pulmonary embolus. 2.  Small to borderline enlarged mediastinal lymph nodes, increased from 04/02/2017 and possibly reactive. Consider follow-up CT chest with contrast in 3 months as a lymphoproliferative disorder cannot be excluded. 3. Cholelithiasis. 4. Left adrenal adenoma. 5. Aortic atherosclerosis (ICD10-I70.0). Coronary artery calcification. 6. Enlarged pulmonic trunk, indicative  of pulmonary arterial hypertension. Electronically Signed   By: Lorin Picket M.D.   On: 03/23/2022 11:26    Procedures Procedures    Medications Ordered in ED Medications  furosemide (LASIX) injection 40 mg (40 mg Intravenous Given 03/23/22 1011)  sodium chloride (PF) 0.9 % injection (  Given by Other 03/23/22 1114)  iohexol (OMNIPAQUE) 350 MG/ML injection 75 mL (75 mLs Intravenous Contrast Given 03/23/22 1107)    ED Course/ Medical Decision Making/ A&P Clinical Course as of 03/23/22 1252  Mon Mar 23, 2022  1241 Patient is feeling much better after his diuretics.  Okay for discharge with PCP and cardiology follow-up. [MT]    Clinical Course User Index [MT] Rashonda Warrior, Carola Rhine, MD                             Medical Decision Making Amount and/or Complexity of Data Reviewed Labs: ordered. Radiology: ordered. ECG/medicine tests: ordered.  Risk Prescription drug management.   This patient presents to the ED with concern for shortness of breath, leg swelling. This involves an extensive number of treatment options, and is a complaint that carries with it a high risk of complications and morbidity.  The differential diagnosis includes CHF exacerbation and/or pneumonia, and/or pulmonary embolism versus pleural effusion versus other  Co-morbidities that complicate the patient evaluation: History of congestive heart failure and DVT at high risk of CHF exacerbation and pulmonary embolism  Additional history obtained from the patient's wife at bedside  External records from outside source obtained and reviewed including most recent  echocardiogram and left heart catheterization from approximately 5 years ago, as noted above  I ordered and personally interpreted labs.  The pertinent results include: BNP 458.  K3.2.  COVID flu negative.  D-dimer elevated.  No acute anemia  I ordered imaging studies including CT PE I independently visualized and interpreted imaging which showed motion graded artifact, no evident pneumonia or pulmonary embolism, some nonspecific lymph node enlargement, gallstones I agree with the radiologist interpretation  The patient was maintained on a cardiac monitor.  I personally viewed and interpreted the cardiac monitored which showed an underlying rhythm of: Normal sinus rhythm  Per my interpretation the patient's ECG shows normal sinus rhythm no acute ischemic findings  I ordered medication including IV Lasix for pulmonary edema diuresis  I have reviewed the patients home medicines and have made adjustments as needed  Test Considered: Low suspicion that symptoms are related to his gallstones, which I suspect were incidental finding, not feel he needs a right upper quadrant ultrasound at this time.  After the interventions noted above, I reevaluated the patient and found that they have: improved  Dispostion:  After consideration of the diagnostic results and the patients response to treatment, I feel that the patent would benefit from outpatient cardiology and PCP follow-up.         Final Clinical Impression(s) / ED Diagnoses Final diagnoses:  Shortness of breath  Hypokalemia  Hyperglycemia    Rx / DC Orders ED Discharge Orders     None         Wyvonnia Dusky, MD 03/23/22 1252

## 2022-03-24 ENCOUNTER — Other Ambulatory Visit: Payer: Self-pay

## 2022-03-24 ENCOUNTER — Other Ambulatory Visit: Payer: Self-pay | Admitting: Nurse Practitioner

## 2022-03-24 ENCOUNTER — Other Ambulatory Visit (HOSPITAL_COMMUNITY): Payer: Self-pay

## 2022-03-24 DIAGNOSIS — M170 Bilateral primary osteoarthritis of knee: Secondary | ICD-10-CM

## 2022-03-24 MED ORDER — ACCU-CHEK AVIVA PLUS VI STRP
ORAL_STRIP | 3 refills | Status: DC
  Filled 2022-03-24: qty 100, 90d supply, fill #0
  Filled 2022-06-10: qty 100, 90d supply, fill #1

## 2022-03-24 MED ORDER — TRAMADOL HCL 50 MG PO TABS
50.0000 mg | ORAL_TABLET | Freq: Four times a day (QID) | ORAL | 0 refills | Status: DC | PRN
  Filled 2022-03-24: qty 30, 8d supply, fill #0

## 2022-03-25 ENCOUNTER — Encounter: Payer: Self-pay | Admitting: Podiatry

## 2022-03-25 ENCOUNTER — Ambulatory Visit: Payer: Medicare HMO | Admitting: Podiatry

## 2022-03-25 ENCOUNTER — Other Ambulatory Visit (HOSPITAL_COMMUNITY): Payer: Self-pay

## 2022-03-25 DIAGNOSIS — B351 Tinea unguium: Secondary | ICD-10-CM | POA: Diagnosis not present

## 2022-03-25 DIAGNOSIS — M79675 Pain in left toe(s): Secondary | ICD-10-CM | POA: Diagnosis not present

## 2022-03-25 DIAGNOSIS — E1140 Type 2 diabetes mellitus with diabetic neuropathy, unspecified: Secondary | ICD-10-CM

## 2022-03-25 DIAGNOSIS — M79674 Pain in right toe(s): Secondary | ICD-10-CM | POA: Diagnosis not present

## 2022-03-25 DIAGNOSIS — E1149 Type 2 diabetes mellitus with other diabetic neurological complication: Secondary | ICD-10-CM | POA: Diagnosis not present

## 2022-03-26 NOTE — Progress Notes (Signed)
Subjective:   Patient ID: Thomas [name], male   DOB: 72 y.o.   MRN: [id number]   HPI Patient presents with chronic nail disease 1-5 both feet that get thick and he cannot take care of and is with caregiver with significant obesity also noted does not smoke likes to be active   Review of Systems  All other systems reviewed and are negative.       Objective:  Physical Exam Vitals and nursing note reviewed.  Constitutional:      Appearance: He is well-developed.  Pulmonary:     Effort: Pulmonary effort is normal.  Musculoskeletal:        General: Normal range of motion.  Skin:    General: Skin is warm.  Neurological:     Mental Status: He is alert.     Neurovascular status intact muscle strength found to be adequate range of motion was adequate with thick yellow brittle nailbeds 1-5 both feet that are dystrophic and painful when pressed with obesity is complicating factor inability to take care of them himself or for caregiver to take care of them     Assessment:  Chronic mycotic nail infection with pain 1-5 both feet     Plan:  H&P done debridement nailbeds 1-5 both feet nitrogen and bleeding discussed weight loss which I think would be of great benefit to him

## 2022-03-28 ENCOUNTER — Other Ambulatory Visit: Payer: Self-pay | Admitting: Nurse Practitioner

## 2022-03-28 DIAGNOSIS — E1122 Type 2 diabetes mellitus with diabetic chronic kidney disease: Secondary | ICD-10-CM

## 2022-03-30 ENCOUNTER — Ambulatory Visit (INDEPENDENT_AMBULATORY_CARE_PROVIDER_SITE_OTHER): Payer: Medicare HMO | Admitting: Nurse Practitioner

## 2022-03-30 ENCOUNTER — Encounter: Payer: Self-pay | Admitting: Nurse Practitioner

## 2022-03-30 VITALS — BP 158/86 | HR 76 | Temp 97.3°F | Resp 18 | Ht 69.0 in | Wt 330.0 lb

## 2022-03-30 DIAGNOSIS — E1122 Type 2 diabetes mellitus with diabetic chronic kidney disease: Secondary | ICD-10-CM

## 2022-03-30 DIAGNOSIS — I5022 Chronic systolic (congestive) heart failure: Secondary | ICD-10-CM | POA: Diagnosis not present

## 2022-03-30 DIAGNOSIS — I10 Essential (primary) hypertension: Secondary | ICD-10-CM | POA: Diagnosis not present

## 2022-03-30 DIAGNOSIS — E785 Hyperlipidemia, unspecified: Secondary | ICD-10-CM | POA: Diagnosis not present

## 2022-03-30 DIAGNOSIS — I825Y2 Chronic embolism and thrombosis of unspecified deep veins of left proximal lower extremity: Secondary | ICD-10-CM

## 2022-03-30 NOTE — Progress Notes (Signed)
Careteam: Patient Care Team: [name], NP as PCP - General (Geriatric Medicine) Jettie Booze, MD as PCP - Cardiology (Cardiology) Katy Fitch, Darlina Guys, MD as Consulting Physician (Ophthalmology)  PLACE OF SERVICE:  [place] Directive information    Allergies  Allergen Reactions   Lisinopril Cough    Chief Complaint  Patient presents with   Acute Visit    Patient is her for edema of the lower extremities went to ER on 03/24/2021 States lasix medication is not working at this time. Needs referral to Cardiologist.     HPI: Patient is a 72 y.o. male here for follow-up after an ED visit for SOB and leg swelling. Pt with hx of CHF, OA, CKD, anemia, DVT.   He was instructed to take an extra dose of lasix  (27m) in the morning for 5 days plus an extra 40 mg tablet in the afternoon. He did feel like he had more urine output during those 5 days. Then he transitioned to taking lasix 40 mg tab twice a day per instructions. Reports shortness of breath more with activity but has improves and continues to improve daily. No chest pain.  Still has some swelling in his legs.  He typically wears oxygen. He usually wears 2L of oxygen with activity. He is at his baseline with oxygen use now but says his shortness of breath is worse than normal.  He is mainly here because he wants a referral to cardiology.  Has seen Dr VIrish Lackin the past.   Has not been eating healthy but trying to do better since the beginning of February.    Review of Systems:  Review of Systems  Constitutional:  Negative for chills, fever, malaise/fatigue and weight loss.  HENT:  Negative for congestion and sore throat.   Eyes:  Negative for blurred vision.  Respiratory:  Positive for shortness of breath. Negative for cough and wheezing.        Chronic with exertion, better than last week but not back to baseline  Cardiovascular:  Positive for leg swelling. Negative for chest pain and  palpitations.       Reports swelling is improved since ED visit  Gastrointestinal:  Negative for abdominal pain, blood in stool, constipation, diarrhea, heartburn, nausea and vomiting.  Genitourinary:  Negative for dysuria, frequency, hematuria and urgency.  Musculoskeletal:  Negative for falls and joint pain.       Pt uses a wheelchair for mobility  Skin:  Negative for rash.  Neurological:  Negative for dizziness, tingling and headaches.  Endo/Heme/Allergies:  Negative for polydipsia.  Psychiatric/Behavioral:  Negative for depression. The patient is not nervous/anxious.     Past Medical History:  Diagnosis Date   Acute on chronic diastolic CHF (congestive heart failure) [place] 06/01/2017   Arthritis    [place]   Prostate   Chronic kidney disease    Diabetes mellitus    Diabetic neuropathy ([place]    feet   DVT (deep venous thrombosis) ([place]    Genetic testing 06/22/2016   Mr. MMatusikunderwent genetic counseling and testing for hereditary cancer syndromes on 05/14/2016. His results were negative for mutations in all 46 genes analyzed by [employer]'s 46-gene Common Hereditary Cancers Panel. Genes analyzed include: APC, ATM, AXIN2, BARD1, BMPR1A, BRCA1, BRCA2, BRIP1, CDH1, CDKN2A, CHEK2, CTNNA1, DICER1, EPCAM, GREM1, HOXB13, KIT, MEN1, MLH1, MSH2, MSH3, MSH6, MUTYH, NB   GERD (gastroesophageal reflux disease)    Hypertension    PE (pulmonary thromboembolism) (  [place])    Pneumonia    Sleep apnea    not wearing CPAP   Suprapubic catheter ([place]) 11/17/2019   Past Surgical History:  Procedure Laterality Date   CARDIOVERSION N/A 06/03/2017   Procedure: CARDIOVERSION;  Surgeon: Jerline Pain, MD;  Location: [facility];  Service: Cardiovascular;  Laterality: N/A;   CATARACT EXTRACTION Left 07/31/2021   Dr.Glenn, [place], [place]   COLONOSCOPY     COLONOSCOPY WITH PROPOFOL N/A 10/20/2018   Procedure: COLONOSCOPY WITH PROPOFOL;  Surgeon: Thornton Park, MD;  Location: [facility]  ENDOSCOPY;  Service: Gastroenterology;  Laterality: N/A;   HERNIA REPAIR     KNEE SURGERY     MULTIPLE TOOTH EXTRACTIONS     POLYPECTOMY  10/20/2018   Procedure: POLYPECTOMY;  Surgeon: Thornton Park, MD;  Location: [facility] ENDOSCOPY;  Service: Gastroenterology;;   PROSTATE SURGERY     RADIOACTIVE SEED IMPLANT     RIGHT/LEFT HEART CATH AND CORONARY ANGIOGRAPHY N/A 07/06/2017   Procedure: RIGHT/LEFT HEART CATH AND CORONARY ANGIOGRAPHY;  Surgeon: Jettie Booze, MD;  Location: [facility];  Service: Cardiovascular;  Laterality: N/A;   SHOULDER SURGERY     TOTAL KNEE ARTHROPLASTY Right 11/19/2016   Procedure: RIGHT TOTAL KNEE ARTHROPLASTY;  Surgeon: Leandrew Koyanagi, MD;  Location: [facility];  Service: Orthopedics;  Laterality: Right;   uretha surgery-2014     Social History:   reports that he has never smoked. He has never used smokeless tobacco. He reports that he does not drink alcohol and does not use drugs.  Family History  Problem Relation Age of Onset   Breast cancer Mother 60       d.79   Breast cancer Sister 30       d.30   Leukemia Brother 18       d.20   Breast cancer Maternal Aunt 40       d.50s   Lung cancer Maternal Uncle    Prostate cancer Paternal Uncle    Prostate cancer Brother        recurred recently at age 50   Other Brother 32       spinal tumor   Cervical cancer Other 22       d.22   Cancer Sister 84       unspecified type   Cancer Maternal Uncle 26       unspecified type    Medications: Patient's Medications  New Prescriptions   No medications on file  Previous Medications   ACCU-CHEK SOFTCLIX LANCETS LANCETS    Use to test blood sugar daily   ALCOHOL SWABS (B-D SINGLE USE SWABS REGULAR) PADS    Use in testing blood sugar daily. Dx: E11.22   AMLODIPINE (NORVASC) 10 MG TABLET    TAKE 1 TABLET EVERY DAY FOR HIGH BLOOD PRESSURE   ASCORBIC ACID (VITAMIN C) 1000 MG TABLET    Take 2 tablets (2,000 mg total) by mouth daily.   ASCORBIC ACID (VITAMIN  C) 500 MG TABLET    Take 1 tablet (500 mg total) by mouth daily.   ASHWAGANDHA PO    Take 1 tablet by mouth in the morning and at bedtime. Brand: Goli, gummy   ASPIRIN EC 81 MG TABLET    Take 81 mg by mouth daily.   ATORVASTATIN (LIPITOR) 40 MG TABLET    TAKE 1 TABLET EVERY DAY   BLOOD GLUCOSE CALIBRATION (ACCU-CHEK AVIVA) SOLN    Use once daily as directed dx E11.22   BLOOD  GLUCOSE MONITORING SUPPL (TRUE METRIX AIR GLUCOSE METER) W/DEVICE KIT    1 Device by Does not apply route daily as needed. E11.22   BUDESONIDE-FORMOTEROL (SYMBICORT) 160-4.5 MCG/ACT INHALER    Inhale 2 puffs into the lungs 2 (two) times daily.   CARVEDILOL (COREG) 12.5 MG TABLET    TAKE 1 TABLET TWICE DAILY WITH MEALS   CHOLECALCIFEROL (VITAMIN D3) 50 MCG (2000 UT) TABS    Take 1 tablet (2,000 Units) by mouth daily.   EMPAGLIFLOZIN (JARDIANCE) 10 MG TABS TABLET    Take 1 tablet (10 mg total) by mouth daily.   FUROSEMIDE (LASIX) 40 MG TABLET    TAKE 1 TABLET (40 MG TOTAL) BY MOUTH DAILY.   GLIPIZIDE (GLUCOTROL) 5 MG TABLET    TAKE 1 TABLET EVERY DAY BEFORE BREAKFAST   GLUCOSE BLOOD (ACCU-CHEK AVIVA PLUS) TEST STRIP    Use to test blood sugar daily.   HYDRALAZINE (APRESOLINE) 25 MG TABLET    TAKE 3 TABLETS THREE TIMES DAILY   LANCETS MISC. (ACCU-CHEK SOFTCLIX LANCET DEV) KIT    Use to test blood sugar daily   LOSARTAN (COZAAR) 50 MG TABLET    TAKE 1 TABLET (50 MG TOTAL) BY MOUTH DAILY.   METFORMIN (GLUCOPHAGE) 500 MG TABLET    TAKE 1 TABLET TWICE DAILY WITH MEALS   METHOCARBAMOL (ROBAXIN) 500 MG TABLET    Take 1 tablet (500 mg total) by mouth every 8 (eight) hours as needed for muscle spasms.   MISC NATURAL PRODUCTS (SUPER GREENS PO)    Take 1 tablet by mouth 2 (two) times daily. Brand: Goli, gummy   POTASSIUM CHLORIDE SA (KLOR-CON M) 20 MEQ TABLET    TAKE 1 TABLET TWICE DAILY   RIVAROXABAN (XARELTO) 20 MG TABS TABLET    Take 1 tablet (20 mg total) by mouth daily with supper.   SENNA 8.7 MG CHEW    Chew 1 tablet by mouth as  needed.   TRAMADOL (ULTRAM) 50 MG TABLET    Take 1 tablet (50 mg total) by mouth every 6 (six) hours as needed for pain   TRUEPLUS LANCETS 28G MISC    1 Device by Other route daily as needed. DX:E11.22   UNABLE TO FIND    Take 1 tablet by mouth daily. Med Name: Cinnachroma   UNABLE TO FIND    Med Name: Goli Beet supplement 1 by mouth 2 times daily   ZINC GLUCONATE 50 MG TABLET    Take 1 tablet (50 mg total) by mouth daily.   ZINC GLUCONATE 50 MG TABLET    Take 1 tablet (50 mg total) by mouth daily.  Modified Medications   No medications on file  Discontinued Medications   No medications on file    Physical Exam:  Vitals:   03/30/22 1420 03/30/22 1522  BP: (!) 146/80 (!) 158/86  Pulse: 76   Resp: 18   Temp: (!) 97.3 F (36.3 C)   SpO2: (!) 85% 91%  Weight: (!) 330 lb (149.7 kg)   Height: 5' 9"$  (1.753 m)    Body mass index is 48.73 kg/m. Wt Readings from Last 3 Encounters:  03/30/22 (!) 330 lb (149.7 kg)  02/06/22 (!) 330 lb (149.7 kg)  11/18/21 (!) 330 lb (149.7 kg)    Physical Exam Vitals reviewed.  Constitutional:      Appearance: He is obese.  Cardiovascular:     Rate and Rhythm: Normal rate and regular rhythm.     Heart sounds: Normal heart sounds.  No murmur heard. Pulmonary:     Breath sounds: Normal breath sounds. No wheezing or rales.  Abdominal:     General: Bowel sounds are normal.     Palpations: Abdomen is soft. There is no mass.     Tenderness: There is no abdominal tenderness. There is no guarding.  Musculoskeletal:        General: No swelling or tenderness.     Right lower leg: Edema present.     Left lower leg: Edema present.     Comments: +1 pitting edema Pt uses a wheelchair for mobility at baseline  Skin:    General: Skin is warm and dry.  Neurological:     Mental Status: He is alert and oriented to person, place, and time. Mental status is at baseline.  Psychiatric:        Mood and Affect: Mood normal.        Behavior: Behavior normal.         Judgment: Judgment normal.     Labs reviewed: Basic Metabolic Panel: Recent Labs    05/08/21 1815 05/19/21 1029 09/29/21 1018 03/23/22 0854  NA 142 146 144 142  K 3.3* 3.8 3.4* 3.2*  CL 108 110 108 104  CO2 26 29 23 26  $ GLUCOSE 144* 158* 202* 301*  BUN 14 15 20 19  $ CREATININE 1.05 0.99 1.16 1.11  CALCIUM 9.0 8.6 8.8 8.6*  MG 1.9  --   --   --    Liver Function Tests: Recent Labs    05/08/21 1815 09/29/21 1018  AST 17 8*  ALT 17 13  ALKPHOS 62  --   BILITOT 0.8 0.7  PROT 7.3 6.8  ALBUMIN 3.7  --    No results for input(s): "LIPASE", "AMYLASE" in the last 8760 hours. No results for input(s): "AMMONIA" in the last 8760 hours. CBC: Recent Labs    05/08/21 1815 09/29/21 1018 03/23/22 0854  WBC 8.2 5.9 6.4  NEUTROABS 6.2 4,372 5.0  HGB 13.5 13.6 13.6  HCT 43.1 41.8 43.8  MCV 88.5 86.9 87.1  PLT 248 254 222   Lipid Panel: Recent Labs    05/19/21 1029  CHOL 113  HDL 52  LDLCALC 47  TRIG 65  CHOLHDL 2.2   TSH: No results for input(s): "TSH" in the last 8760 hours. A1C: Lab Results  Component Value Date   HGBA1C 7.2 (H) 09/29/2021     Assessment/Plan 1. Chronic systolic heart failure [facility]) He saw Dr. Irish Lack for initial dx of HF but did not follow up.  Symptoms have been stable until recently when he went to ED -Discussed worsening of symptoms and that patient should proceed to ED for evaluation and necessary treatment should this occur. Addressed importance of maintenance visits with cardiology with managing chronic illness.  - Ambulatory referral to Cardiology - BASIC METABOLIC PANEL WITH GFR to monitor for potassium and Cr  2. Primary hypertension Patient endorses unhealthy eating and lack of activity. Pt states he is compliant with losartan, hydralazine, furosemide, and carvedilol at this time. BP is still elevated. Discussed healthy eating habits and avoiding sodium with patient. He says he wants to try.  -will get lab work today  3.  Chronic deep vein thrombosis (DVT) of proximal vein of left lower extremity ([place]) On xarelto. No s/s of bleeding. Continue xarelto. No signs of DVT or PE   4. Type 2 diabetes mellitus with chronic kidney disease, without long-term current use of insulin, unspecified CKD stage [facility]) Patient  up to date with foot exams. He is taking metformin, jardiance, and glipizide. Not compliant with diet or exercise. Weight is stable and BMI is 48.  -reports he has been taking medication as prescribed.  - Hemoglobin A1c  5. Hyperlipidemia LDL goal <70 Pt is on atorvastatin. Reinforced importance of healthy diet, low fat, low carbohydrates, and increased fiber in addition to increasing activity levels with patient.   - Lipid panel   Return in about 3 months (around 06/28/2022) for routine follow up .  Student- Archer Asa O'Berry ACPCNP-S  I personally was present during the history, physical exam and medical decision-making activities of this service and have verified that the service and findings are accurately documented in the student's note Tyshawna Alarid K. Hopkins, [facility] [phone]

## 2022-03-30 NOTE — Patient Instructions (Signed)
Referral sent to [facility] they will contact patient with appointment.   [facility] at [facility]   Address: [address], [place], [place] [address]   Phone: [phone]

## 2022-03-31 LAB — LIPID PANEL
Cholesterol: 136 mg/dL (ref <200)
HDL: 58 mg/dL (ref >=40)
LDL Cholesterol (Calc): 62 mg/dL (calc)
Non-HDL Cholesterol (Calc): 78 mg/dL (calc) (ref <130)
Total CHOL/HDL Ratio: 2.3 (calc) (ref <5.0)
Triglycerides: 84 mg/dL (ref <150)

## 2022-03-31 LAB — HEMOGLOBIN A1C
Hgb A1c MFr Bld: 8.6 % of total Hgb — ABNORMAL HIGH (ref <5.7)
Mean Plasma Glucose: 200 mg/dL
eAG (mmol/L): 11.1 mmol/L

## 2022-03-31 LAB — BASIC METABOLIC PANEL WITH GFR
BUN: 20 mg/dL (ref 7–25)
CO2: 23 mmol/L (ref 20–32)
Calcium: 8.8 mg/dL (ref 8.6–10.3)
Chloride: 106 mmol/L (ref 98–110)
Creat: 1.21 mg/dL (ref 0.70–1.28)
Glucose, Bld: 117 mg/dL (ref 65–139)
Potassium: 3.3 mmol/L — ABNORMAL LOW (ref 3.5–5.3)
Sodium: 145 mmol/L (ref 135–146)
eGFR: 64 mL/min/{1.73_m2} (ref >=60)

## 2022-04-08 DIAGNOSIS — R339 Retention of urine, unspecified: Secondary | ICD-10-CM | POA: Diagnosis not present

## 2022-04-08 DIAGNOSIS — Z466 Encounter for fitting and adjustment of urinary device: Secondary | ICD-10-CM | POA: Diagnosis not present

## 2022-04-15 DIAGNOSIS — R339 Retention of urine, unspecified: Secondary | ICD-10-CM | POA: Diagnosis not present

## 2022-04-25 ENCOUNTER — Other Ambulatory Visit: Payer: Self-pay | Admitting: Nurse Practitioner

## 2022-04-25 DIAGNOSIS — I509 Heart failure, unspecified: Secondary | ICD-10-CM

## 2022-04-25 DIAGNOSIS — E1122 Type 2 diabetes mellitus with diabetic chronic kidney disease: Secondary | ICD-10-CM

## 2022-05-01 ENCOUNTER — Ambulatory Visit: Payer: Medicare HMO | Attending: Interventional Cardiology | Admitting: Interventional Cardiology

## 2022-05-01 ENCOUNTER — Encounter: Payer: Self-pay | Admitting: Interventional Cardiology

## 2022-05-01 VITALS — BP 166/98 | HR 68 | Ht 69.0 in | Wt 337.2 lb

## 2022-05-01 DIAGNOSIS — R0600 Dyspnea, unspecified: Secondary | ICD-10-CM

## 2022-05-01 DIAGNOSIS — I5022 Chronic systolic (congestive) heart failure: Secondary | ICD-10-CM | POA: Diagnosis not present

## 2022-05-01 DIAGNOSIS — G4730 Sleep apnea, unspecified: Secondary | ICD-10-CM

## 2022-05-01 DIAGNOSIS — I428 Other cardiomyopathies: Secondary | ICD-10-CM

## 2022-05-01 NOTE — Patient Instructions (Addendum)
Medication Instructions:  Your physician recommends that you continue on your current medications as directed. Please refer to the Current Medication list given to you today.  *If you need a refill on your cardiac medications before your next appointment, please call your pharmacy*   Lab Work: Lab work to be done today--BMP and BNP If you have labs (blood work) drawn today and your tests are completely normal, you will receive your results only by: [place] (if you have MyChart) OR A paper copy in the mail If you have any lab test that is abnormal or we need to change your treatment, we will call you to review the results.   Testing/Procedures: Your physician has requested that you have an echocardiogram. Echocardiography is a painless test that uses sound waves to create images of your heart. It provides your doctor with information about the size and shape of your heart and how well your heart's chambers and valves are working. This procedure takes approximately one hour. There are no restrictions for this procedure. Please do NOT wear cologne, perfume, aftershave, or lotions (deodorant is allowed). Please arrive 15 minutes prior to your appointment time.    Follow-Up: At [facility], you and your health needs are our priority.  As part of our continuing mission to provide you with exceptional heart care, we have created designated Provider Care Teams.  These Care Teams include your primary Cardiologist (physician) and Advanced Practice Providers (APPs -  Physician Assistants and Nurse Practitioners) who all work together to provide you with the care you need, when you need it.  We recommend signing up for the patient portal called "MyChart".  Sign up information is provided on this After Visit Summary.  MyChart is used to connect with patients for Virtual Visits (Telemedicine).  Patients are able to view lab/test results, encounter notes, upcoming appointments, etc.   Non-urgent messages can be sent to your provider as well.   To learn more about what you can do with MyChart, go to [email or web address].    Your next appointment:   Based on test results  Provider:   Larae Grooms, MD     Other Instructions You have been referred to see the pharmacist in our office. Please schedule new patient appointments   You have been referred to Dr Halford Chessman  (pulmonologist)

## 2022-05-01 NOTE — Progress Notes (Signed)
Cardiology Office Note   Date:  05/01/2022   ID:  Thomas [name], DOB 1950/05/06, MRN [id number]  PCP:  [name], NP    No chief complaint on file.  Chronic systolic heart failure  Wt Readings from Last 3 Encounters:  05/01/22 (!) 337 lb 3.2 oz (153 kg)  03/30/22 (!) 330 lb (149.7 kg)  02/06/22 (!) 330 lb (149.7 kg)       History of Present Illness: Thomas [name] is a 72 y.o. male who I last saw in 2019.  Prior records show: " history of a nonischemic cardiomyopathy.   He had considered weight loss surgery in 2008.  Insurance changed and he could not get the surgery.     He had a PE in early 2018.  He has been on Xarelto since that time.     He had TKR in 2018.   Heart cath was done in May 2019.  This revealed: There is mild left ventricular systolic dysfunction. LV end diastolic pressure is moderately elevated, 30 mm Hg. The left ventricular ejection fraction is 45-50% by visual estimate. There is no aortic valve stenosis. Hemodynamic findings consistent with moderate to severe pulmonary hypertension. CO 10.8 L/min; CI 4.2, Ao sat 91%; PA sat 72%; mean PCWP 21 mm Hg Nonobstructive CAD. Prox Cx lesion is 25% stenosed.   Non-obstructive CAD.  Moderate to severe pulmonary hypertension."  In 2019 it was noted that he was using BiPAP.  The settings have been given to him 10 years prior.  He had not had any other follow-up for his sleep apnea.  Uses suprapubic catheter after prostate cancer management since 2022.   Increasing shortness of breath.  Swelling stable.  Able to get his shoes on.  Denies : Chest pain. Dizziness. Leg edema. Nitroglycerin use. Orthopnea. Palpitations. Paroxysmal nocturnal dyspnea.  Syncope.      Past Medical History:  Diagnosis Date   Acute on chronic diastolic CHF (congestive heart failure) ([facility]) 06/01/2017   Arthritis    Cancer ([place]) 2010   Prostate   Chronic kidney disease    Diabetes mellitus    Diabetic  neuropathy ([place])    feet   DVT (deep venous thrombosis) ([place])    Genetic testing 06/22/2016   Mr. Murillo underwent genetic counseling and testing for hereditary cancer syndromes on 05/14/2016. His results were negative for mutations in all 46 genes analyzed by [facility]'s 46-gene Common Hereditary Cancers Panel. Genes analyzed include: APC, ATM, AXIN2, BARD1, BMPR1A, BRCA1, BRCA2, BRIP1, CDH1, CDKN2A, CHEK2, CTNNA1, DICER1, EPCAM, GREM1, HOXB13, KIT, MEN1, MLH1, MSH2, MSH3, MSH6, MUTYH, NB   GERD (gastroesophageal reflux disease)    Hypertension    PE (pulmonary thromboembolism) ([facility])    Pneumonia    Sleep apnea    not wearing CPAP   Suprapubic catheter ([place]) 11/17/2019    Past Surgical History:  Procedure Laterality Date   CARDIOVERSION N/A 06/03/2017   Procedure: CARDIOVERSION;  Surgeon: Jerline Pain, MD;  Location: [facility];  Service: Cardiovascular;  Laterality: N/A;   CATARACT EXTRACTION Left 07/31/2021   Dr.Glenn, [place]   COLONOSCOPY     COLONOSCOPY WITH PROPOFOL N/A 10/20/2018   Procedure: COLONOSCOPY WITH PROPOFOL;  Surgeon: Thornton Park, MD;  Location: [facility];  Service: Gastroenterology;  Laterality: N/A;   HERNIA REPAIR     KNEE SURGERY     MULTIPLE TOOTH EXTRACTIONS     POLYPECTOMY  10/20/2018   Procedure: POLYPECTOMY;  Surgeon: Thornton Park, MD;  Location: [facility];  Service: Gastroenterology;;   PROSTATE SURGERY     RADIOACTIVE SEED IMPLANT     RIGHT/LEFT HEART CATH AND CORONARY ANGIOGRAPHY N/A 07/06/2017   Procedure: RIGHT/LEFT HEART CATH AND CORONARY ANGIOGRAPHY;  Surgeon: Jettie Booze, MD;  Location: [facility];  Service: Cardiovascular;  Laterality: N/A;   SHOULDER SURGERY     TOTAL KNEE ARTHROPLASTY Right 11/19/2016   Procedure: RIGHT TOTAL KNEE ARTHROPLASTY;  Surgeon: Leandrew Koyanagi, MD;  Location: [facility];  Service: Orthopedics;  Laterality: Right;   uretha surgery-2014       Current Outpatient Medications   Medication Sig Dispense Refill   Accu-Chek Softclix Lancets lancets Use to test blood sugar daily 100 each 3   acetaminophen (TYLENOL) 500 MG tablet Take by mouth.     Alcohol Swabs (B-D SINGLE USE SWABS REGULAR) PADS Use in testing blood sugar daily. Dx: E11.22 100 each 3   amLODipine (NORVASC) 10 MG tablet TAKE 1 TABLET EVERY DAY FOR HIGH BLOOD PRESSURE 90 tablet 2   Ascorbic Acid (VITAMIN C) 1000 MG tablet Take 2 tablets (2,000 mg total) by mouth daily. 20 tablet 0   aspirin EC 81 MG tablet Take 81 mg by mouth daily.     atorvastatin (LIPITOR) 40 MG tablet TAKE 1 TABLET EVERY DAY 90 tablet 3   Blood Glucose Calibration (ACCU-CHEK AVIVA) SOLN Use once daily as directed dx E11.22 1 each 2   Blood Glucose Monitoring Suppl (TRUE METRIX AIR GLUCOSE METER) w/Device KIT 1 Device by Does not apply route daily as needed. E11.22 1 kit 0   carvedilol (COREG) 12.5 MG tablet TAKE 1 TABLET TWICE DAILY WITH MEALS 180 tablet 3   Cholecalciferol (VITAMIN D3) 50 MCG (2000 UT) TABS Take 1 tablet (2,000 Units) by mouth daily. 10 tablet 0   empagliflozin (JARDIANCE) 10 MG TABS tablet Take 1 tablet (10 mg total) by mouth daily. 90 tablet 3   furosemide (LASIX) 40 MG tablet TAKE 1 TABLET (40 MG TOTAL) BY MOUTH DAILY. 90 tablet 3   glipiZIDE (GLUCOTROL) 5 MG tablet TAKE 1 TABLET EVERY DAY BEFORE BREAKFAST 90 tablet 3   glucose blood (ACCU-CHEK AVIVA PLUS) test strip Use to test blood sugar daily. 100 each 3   hydrALAZINE (APRESOLINE) 25 MG tablet TAKE 3 TABLETS THREE TIMES DAILY 810 tablet 3   Lancets Misc. (ACCU-CHEK SOFTCLIX LANCET DEV) KIT Use to test blood sugar daily 1 kit 0   losartan (COZAAR) 50 MG tablet TAKE 1 TABLET (50 MG TOTAL) BY MOUTH DAILY. 90 tablet 3   metFORMIN (GLUCOPHAGE) 500 MG tablet TAKE 1 TABLET TWICE DAILY WITH MEALS 180 tablet 3   methocarbamol (ROBAXIN) 500 MG tablet Take 1 tablet (500 mg total) by mouth every 8 (eight) hours as needed for muscle spasms. 30 tablet 5   Misc Natural  Products (SUPER GREENS PO) Take 1 tablet by mouth 2 (two) times daily. Brand: Goli, gummy     potassium chloride SA (KLOR-CON M) 20 MEQ tablet TAKE 1 TABLET TWICE DAILY 180 tablet 3   rivaroxaban (XARELTO) 20 MG TABS tablet Take 1 tablet (20 mg total) by mouth daily with supper. 30 tablet 12   Senna 8.7 MG CHEW Chew 1 tablet by mouth as needed. 90 tablet 1   senna-docusate (SENOKOT-S) 8.6-50 MG tablet Take 1 tablet by mouth daily as needed.     traMADol (ULTRAM) 50 MG tablet Take 1 tablet (50 mg total) by mouth every 6 (six) hours as needed  for pain 30 tablet 0   TRUEplus Lancets 28G MISC 1 Device by Other route daily as needed. DX:E11.22 100 each 11   zinc gluconate 50 MG tablet Take 1 tablet (50 mg total) by mouth daily. 14 tablet 0   ASHWAGANDHA PO Take 1 tablet by mouth in the morning and at bedtime. Brand: Goli, gummy (Patient not taking: Reported on 05/01/2022)     budesonide-formoterol (SYMBICORT) 160-4.5 MCG/ACT inhaler Inhale 2 puffs into the lungs 2 (two) times daily. (Patient not taking: Reported on 05/01/2022) 10.2 g 0   UNABLE TO FIND Take 1 tablet by mouth daily. Med Name: Howard Pouch (Patient not taking: Reported on 05/01/2022)     UNABLE TO FIND Med Name: Goli Beet supplement 1 by mouth 2 times daily (Patient not taking: Reported on 05/01/2022)     No current facility-administered medications for this visit.    Allergies:   Lisinopril    Social History:  The patient  reports that he has never smoked. He has never used smokeless tobacco. He reports that he does not drink alcohol and does not use drugs.   Family History:  The patient's family history includes Breast cancer (age of onset: 79) in his sister; Breast cancer (age of onset: 18) in his maternal aunt; Breast cancer (age of onset: 40) in his mother; Cancer (age of onset: 51) in his sister; Cancer (age of onset: 32) in his maternal uncle; Cervical cancer (age of onset: 53) in an other family member; Leukemia (age of onset: 7)  in his brother; Lung cancer in his maternal uncle; Other (age of onset: 48) in his brother; Prostate cancer in his brother and paternal uncle.    ROS:  Please see the history of present illness.   Otherwise, review of systems are positive for increasing shortness of breath and weight gain over the past year.   All other systems are reviewed and negative.    PHYSICAL EXAM: VS:  BP (!) 166/98   Pulse 68   Ht 5\' 9"  (1.753 m)   Wt (!) 337 lb 3.2 oz (153 kg)   SpO2 (!) 86%   BMI 49.80 kg/m  , BMI Body mass index is 49.8 kg/m. GEN: Well nourished, well developed, in no acute distress HEENT: normal Neck: no JVD, carotid bruits, or masses Cardiac: RRR; no murmurs, rubs, or gallops,no edema  Respiratory:  clear to auscultation bilaterally, normal work of breathing GI: soft, nontender, nondistended, + BS MS: no deformity or atrophy Skin: warm and dry, no rash Neuro:  Strength and sensation are intact Psych: euthymic mood, full affect   EKG:   The ekg ordered 2/24 demonstrates normal sinus rhythm with nonspecific ST-T wave changes   Recent Labs: 05/08/2021: Magnesium 1.9 09/29/2021: ALT 13 03/23/2022: B Natriuretic Peptide 458.1; Hemoglobin 13.6; Platelets 222 03/30/2022: BUN 20; Creat 1.21; Potassium 3.3; Sodium 145   Lipid Panel    Component Value Date/Time   CHOL 136 03/30/2022 1524   CHOL 153 03/08/2015 0834   TRIG 84 03/30/2022 1524   HDL 58 03/30/2022 1524   HDL 52 03/08/2015 0834   CHOLHDL 2.3 03/30/2022 1524   VLDL 19 06/01/2017 0236   LDLCALC 62 03/30/2022 1524     Other studies Reviewed: Additional studies/ records that were reviewed today with results demonstrating: February 2024 total cholesterol 136, HDL 58, LDL 62, triglycerides 84.   ASSESSMENT AND PLAN:  Nonischemic cardiomyopathy/chronic systolic/diastolic heart failure: LVEF 45 to 50% in 2019.  Will repeat echocardiogram to see where  the EF is currently.  Worsening shortness of breath.   Pulmonary  hypertension: Needs management of his sleep machine.  Refer back to pulmonary per their request.  He saw Dr. Halford Chessman in the past. Morbid obesity: Wants to try home health PT. I spoke about the importance of weight loss.  I think deconditioning may be playing a part in his lack of energy.  He is not very active.  Gradually, hopefully this can improve. History of pulmonary embolism. Hyperlipidemia: Well-controlled on high-dose atorvastatin. Diabetes: Hemoglobin A1c is 8.6.  Wants to look in to GLP1 antagonist.  Refer to Pharm.D. clinic   Current medicines are reviewed at length with the patient today.  The patient concerns regarding his medicines were addressed.  The following changes have been made:  No change; change diuretic dose based on lab values.    Labs/ tests ordered today include: BMP and bmet, check echocardiogram No orders of the defined types were placed in this encounter.   Recommend 150 minutes/week of aerobic exercise Low fat, low carb, high fiber diet recommended  Disposition:   FU in based on test results   Signed, Larae Grooms, MD  05/01/2022 10:05 AM    [facility] [address], [place], [place]  [address] Phone: [phone]; Fax: [phone]

## 2022-05-02 LAB — BASIC METABOLIC PANEL
BUN/Creatinine Ratio: 11 (ref 10–24)
BUN: 12 mg/dL (ref 8–27)
CO2: 23 mmol/L (ref 20–29)
Calcium: 9 mg/dL (ref 8.6–10.2)
Chloride: 105 mmol/L (ref 96–106)
Creatinine, Ser: 1.06 mg/dL (ref 0.76–1.27)
Glucose: 164 mg/dL — ABNORMAL HIGH (ref 70–99)
Potassium: 3.4 mmol/L — ABNORMAL LOW (ref 3.5–5.2)
Sodium: 144 mmol/L (ref 134–144)
eGFR: 75 mL/min/{1.73_m2} (ref >59)

## 2022-05-02 LAB — PRO B NATRIURETIC PEPTIDE: NT-Pro BNP: 1160 pg/mL — ABNORMAL HIGH (ref 0–376)

## 2022-05-04 ENCOUNTER — Telehealth: Payer: Self-pay | Admitting: *Deleted

## 2022-05-04 DIAGNOSIS — I5022 Chronic systolic (congestive) heart failure: Secondary | ICD-10-CM

## 2022-05-04 DIAGNOSIS — R0600 Dyspnea, unspecified: Secondary | ICD-10-CM

## 2022-05-04 NOTE — Telephone Encounter (Signed)
Patient notified.  He will come in for lab work on May 08, 2022

## 2022-05-04 NOTE — Telephone Encounter (Signed)
I spoke with patient.  He reports he is already taking furosemide 40 mg twice daily.  States increase was made last month when he was seen in ED at [facility].  Patient reports he is currently taking Potassium 20 meq three times daily. This was increased by PCP in mid February. Will forward to Dr Irish Lack for recommendations.

## 2022-05-04 NOTE — Telephone Encounter (Signed)
Can increase Lasix to 80 mg BID for 4 days. Can change potassium to 65mEq BID for three days; after 4 days, can return to usual dose. BMet Friday.  If he does not feel an improvement, will consider switch to torsemide.

## 2022-05-04 NOTE — Telephone Encounter (Signed)
-----   Message from Jettie Booze, MD sent at 05/04/2022  8:21 AM EDT ----- Increase furosemide to 40 mg BID for 3 days.  Evidence of increased fluid in his system.  Can take an extra potassium 20 mEq (total 60 mEq) for the three days.  Then go back to current doses of Lasix and potassium.  BMet next week.

## 2022-05-08 ENCOUNTER — Ambulatory Visit: Payer: Medicare HMO | Attending: Interventional Cardiology

## 2022-05-08 DIAGNOSIS — I5022 Chronic systolic (congestive) heart failure: Secondary | ICD-10-CM

## 2022-05-08 DIAGNOSIS — R0600 Dyspnea, unspecified: Secondary | ICD-10-CM | POA: Diagnosis not present

## 2022-05-08 LAB — BASIC METABOLIC PANEL
BUN/Creatinine Ratio: 11 (ref 10–24)
BUN: 14 mg/dL (ref 8–27)
CO2: 25 mmol/L (ref 20–29)
Calcium: 9.4 mg/dL (ref 8.6–10.2)
Chloride: 104 mmol/L (ref 96–106)
Creatinine, Ser: 1.22 mg/dL (ref 0.76–1.27)
Glucose: 168 mg/dL — ABNORMAL HIGH (ref 70–99)
Potassium: 3.8 mmol/L (ref 3.5–5.2)
Sodium: 142 mmol/L (ref 134–144)
eGFR: 63 mL/min/{1.73_m2} (ref >59)

## 2022-05-10 ENCOUNTER — Other Ambulatory Visit: Payer: Self-pay | Admitting: Nurse Practitioner

## 2022-05-10 DIAGNOSIS — I10 Essential (primary) hypertension: Secondary | ICD-10-CM

## 2022-05-11 DIAGNOSIS — R339 Retention of urine, unspecified: Secondary | ICD-10-CM | POA: Diagnosis not present

## 2022-05-11 NOTE — Telephone Encounter (Signed)
Patient has request refill on Amlodipine. Patient medication has warnings. Medication pend and sent to PCP Dewaine Oats Carlos American, NP for approval.

## 2022-05-13 ENCOUNTER — Ambulatory Visit (HOSPITAL_COMMUNITY): Payer: Medicare HMO | Attending: Cardiology

## 2022-05-13 DIAGNOSIS — I5022 Chronic systolic (congestive) heart failure: Secondary | ICD-10-CM | POA: Diagnosis not present

## 2022-05-13 DIAGNOSIS — I428 Other cardiomyopathies: Secondary | ICD-10-CM | POA: Diagnosis not present

## 2022-05-13 DIAGNOSIS — R0600 Dyspnea, unspecified: Secondary | ICD-10-CM | POA: Diagnosis not present

## 2022-05-13 LAB — ECHOCARDIOGRAM COMPLETE
Area-P 1/2: 3.26 cm2
P 1/2 time: 425 msec
S' Lateral: 3.6 cm

## 2022-05-13 MED ORDER — PERFLUTREN LIPID MICROSPHERE
3.0000 mL | INTRAVENOUS | Status: AC | PRN
  Administered 2022-05-13: 3 mL via INTRAVENOUS

## 2022-05-15 ENCOUNTER — Ambulatory Visit: Payer: Medicare HMO | Attending: Internal Medicine | Admitting: Pharmacist

## 2022-05-15 ENCOUNTER — Other Ambulatory Visit (HOSPITAL_COMMUNITY): Payer: Self-pay

## 2022-05-15 VITALS — BP 148/82 | HR 59

## 2022-05-15 DIAGNOSIS — I5022 Chronic systolic (congestive) heart failure: Secondary | ICD-10-CM | POA: Diagnosis not present

## 2022-05-15 DIAGNOSIS — E1122 Type 2 diabetes mellitus with diabetic chronic kidney disease: Secondary | ICD-10-CM | POA: Diagnosis not present

## 2022-05-15 DIAGNOSIS — R339 Retention of urine, unspecified: Secondary | ICD-10-CM | POA: Diagnosis not present

## 2022-05-15 MED ORDER — MOUNJARO 2.5 MG/0.5ML ~~LOC~~ SOAJ
2.5000 mg | SUBCUTANEOUS | 0 refills | Status: DC
  Filled 2022-05-15: qty 2, 28d supply, fill #0

## 2022-05-15 MED ORDER — SPIRONOLACTONE 25 MG PO TABS
25.0000 mg | ORAL_TABLET | Freq: Every day | ORAL | 3 refills | Status: DC
  Filled 2022-05-15: qty 90, 90d supply, fill #0

## 2022-05-15 NOTE — Progress Notes (Unsigned)
Patient ID: Thomas [name]                 DOB: 05-30-1950                    MRN: [id number]     HPI: Thomas [name] is a 72 y.o. male patient referred to pharmacy clinic by Thomas [name] to initiate weight loss therapy with GLP1-RA. PMH is significant for diastolic CHF, CKD, HTN, aortic atherosclerosis, T2DM, obesity, osteoarthritis, PE in 2018, and OSA. Most recent BMI 50.  Home weight ~330 lbs, unable to weigh today. Currently taking Jardiance 10mg  daily, glipizide 5mg  daily, and metformin 500mg  BID for his DM. Most recent A1c 8.6% on 03/30/22. Previously prescribed Trulicity - pt never started this though. Has catheter changes with urology after prostate cancer. OA of his left knee, TKR in 2018, in a wheelchair a lot, wears O2 with activity. 2 of his daughters accompany him today for his visit, discussed GLPs in depth as well as reviewed echo results from 3/27.  Family History: Non contributory.  Social History: No alcohol, tobacco or drug use.  Labs: Lab Results  Component Value Date   HGBA1C 8.6 (H) 03/30/2022    Wt Readings from Last 1 Encounters:  05/01/22 (!) 337 lb 3.2 oz (153 kg)    BP Readings from Last 1 Encounters:  05/01/22 (!) 166/98   Pulse Readings from Last 1 Encounters:  05/01/22 68       Component Value Date/Time   CHOL 136 03/30/2022 1524   CHOL 153 03/08/2015 0834   TRIG 84 03/30/2022 1524   HDL 58 03/30/2022 1524   HDL 52 03/08/2015 0834   CHOLHDL 2.3 03/30/2022 1524   VLDL 19 06/01/2017 0236   LDLCALC 62 03/30/2022 1524    Past Medical History:  Diagnosis Date   Acute on chronic diastolic CHF (congestive heart failure) (Thomas [name]) 06/01/2017   Arthritis    Cancer ([place]) 2010   Prostate   Chronic kidney disease    Diabetes mellitus    Diabetic neuropathy ([place])    feet   DVT (deep venous thrombosis) ([place])    Genetic testing 06/22/2016   Thomas [name] underwent genetic counseling and testing for hereditary cancer syndromes on 05/14/2016. His  results were negative for mutations in all 46 genes analyzed by [facility]'s 46-gene Common Hereditary Cancers Panel. Genes analyzed include: APC, ATM, AXIN2, BARD1, BMPR1A, BRCA1, BRCA2, BRIP1, CDH1, CDKN2A, CHEK2, CTNNA1, DICER1, EPCAM, GREM1, HOXB13, KIT, MEN1, MLH1, MSH2, MSH3, MSH6, MUTYH, NB   GERD (gastroesophageal reflux disease)    Hypertension    PE (pulmonary thromboembolism) ([facility])    Pneumonia    Sleep apnea    not wearing CPAP   Suprapubic catheter ([place]) 11/17/2019    Current Outpatient Medications on File Prior to Visit  Medication Sig Dispense Refill   Accu-Chek Softclix Lancets lancets Use to test blood sugar daily 100 each 3   acetaminophen (TYLENOL) 500 MG tablet Take by mouth.     Alcohol Swabs (B-D SINGLE USE SWABS REGULAR) PADS Use in testing blood sugar daily. Dx: E11.22 100 each 3   amLODipine (NORVASC) 10 MG tablet TAKE 1 TABLET EVERY DAY FOR HIGH BLOOD PRESSURE 90 tablet 1   Ascorbic Acid (VITAMIN C) 1000 MG tablet Take 2 tablets (2,000 mg total) by mouth daily. 20 tablet 0   ASHWAGANDHA PO Take 1 tablet by mouth in the morning and at bedtime. Brand: Goli, gummy (Patient not taking: Reported on 05/01/2022)  aspirin EC 81 MG tablet Take 81 mg by mouth daily.     atorvastatin (LIPITOR) 40 MG tablet TAKE 1 TABLET EVERY DAY 90 tablet 3   Blood Glucose Calibration (ACCU-CHEK AVIVA) SOLN Use once daily as directed dx E11.22 1 each 2   Blood Glucose Monitoring Suppl (TRUE METRIX AIR GLUCOSE METER) w/Device KIT 1 Device by Does not apply route daily as needed. E11.22 1 kit 0   budesonide-formoterol (SYMBICORT) 160-4.5 MCG/ACT inhaler Inhale 2 puffs into the lungs 2 (two) times daily. (Patient not taking: Reported on 05/01/2022) 10.2 g 0   carvedilol (COREG) 12.5 MG tablet TAKE 1 TABLET TWICE DAILY WITH MEALS 180 tablet 3   Cholecalciferol (VITAMIN D3) 50 MCG (2000 UT) TABS Take 1 tablet (2,000 Units) by mouth daily. 10 tablet 0   empagliflozin (JARDIANCE) 10 MG TABS tablet  Take 1 tablet (10 mg total) by mouth daily. 90 tablet 3   furosemide (LASIX) 40 MG tablet TAKE 1 TABLET (40 MG TOTAL) BY MOUTH DAILY. 90 tablet 3   glipiZIDE (GLUCOTROL) 5 MG tablet TAKE 1 TABLET EVERY DAY BEFORE BREAKFAST 90 tablet 3   glucose blood (ACCU-CHEK AVIVA PLUS) test strip Use to test blood sugar daily. 100 each 3   hydrALAZINE (APRESOLINE) 25 MG tablet TAKE 3 TABLETS THREE TIMES DAILY 810 tablet 3   Lancets Misc. (ACCU-CHEK SOFTCLIX LANCET DEV) KIT Use to test blood sugar daily 1 kit 0   losartan (COZAAR) 50 MG tablet TAKE 1 TABLET (50 MG TOTAL) BY MOUTH DAILY. 90 tablet 3   metFORMIN (GLUCOPHAGE) 500 MG tablet TAKE 1 TABLET TWICE DAILY WITH MEALS 180 tablet 3   methocarbamol (ROBAXIN) 500 MG tablet Take 1 tablet (500 mg total) by mouth every 8 (eight) hours as needed for muscle spasms. 30 tablet 5   Misc Natural Products (SUPER GREENS PO) Take 1 tablet by mouth 2 (two) times daily. Brand: Goli, gummy     potassium chloride SA (KLOR-CON M) 20 MEQ tablet TAKE 1 TABLET TWICE DAILY 180 tablet 3   rivaroxaban (XARELTO) 20 MG TABS tablet Take 1 tablet (20 mg total) by mouth daily with supper. 30 tablet 12   Senna 8.7 MG CHEW Chew 1 tablet by mouth as needed. 90 tablet 1   senna-docusate (SENOKOT-S) 8.6-50 MG tablet Take 1 tablet by mouth daily as needed.     traMADol (ULTRAM) 50 MG tablet Take 1 tablet (50 mg total) by mouth every 6 (six) hours as needed for pain 30 tablet 0   TRUEplus Lancets 28G MISC 1 Device by Other route daily as needed. DX:E11.22 100 each 11   UNABLE TO FIND Take 1 tablet by mouth daily. Med Name: Howard Pouch (Patient not taking: Reported on 05/01/2022)     UNABLE TO FIND Med Name: Goli Beet supplement 1 by mouth 2 times daily (Patient not taking: Reported on 05/01/2022)     zinc gluconate 50 MG tablet Take 1 tablet (50 mg total) by mouth daily. 14 tablet 0   No current facility-administered medications on file prior to visit.    Allergies  Allergen Reactions    Lisinopril Cough and Other (See Comments)    Muscle pain     Assessment/Plan:  1. T2DM - A1c 8.6% on Jardiance 10mg  daily, glipizide 5mg  daily, and metformin 500mg  BID. BMI 50. Would benefit from [name] for glycemic control, weight loss, and CV benefit. Discussed Mounjaro in depth today including expected benefits, potential side effects, injection technique, and cost. Copay $45/month until he  hits the donut hole, then would likely increase to ~$200/month. Donut hole should be going away next year. Pt is interested in trying [facility]. Will start at 2.5mg  weekly dose and increase monthly. No prior auth needed.  Advised patient on common side effects including nausea, diarrhea, dyspepsia, decreased appetite, and fatigue. Counseled patient on reducing meal size and how to titrate medication to minimize side effects. Counseled patient to call if intolerable side effects or if experiencing dehydration, abdominal pain, or dizziness. Patient will adhere to dietary modifications and will target at least 150 minutes of moderate intensity exercise weekly.   I will call pt monthly for dose titrations. He will continue to monitor his glucose and call with any lows. Hopefully will be able to stop his glipizide in the future.  2. CHF - pt inquired about echo results from 3/27. Advised that report has not been read yet by physician, but his EF is stable compared to 2019 echo with LVEF 45-50%. Pt already taking Jardiance 10mg  daily, carvedilol 12.5mg  BID, losartan 50mg  daily, also on hydralazine 25mg  TID. Not on MRA. Reports he's been taking his Lasix BID since last OV with Thomas [name], don't see dose change mentioned in his visit. Reports weights have been stable at home. Advised him to decrease back to 1 tab daily as prescribed and to resume taking 1 potassium tablet daily (had increased this to 2 daily as well). BP elevated today at 148/82. Will add spironolactone 25mg  daily and repeat BMET in 7-10 days. Hopefully  will be able to stop his KCl supplement, but did have hypokalemia in the past. He is also on amlodipine 10mg  daily, will continue for now since BP is elevated but would be first med to d/c if able. Weight loss with GLP should also help to improve his BP, he will continue monitoring at home.  Thomas [name], PharmD, BCACP,  [place]. [address] [place], [place] [address] Phone: [phone]; Fax: [phone] 05/15/2022 11:11 AM

## 2022-05-15 NOTE — Patient Instructions (Addendum)
Your blood pressure goal is < 130/80  Your ejection fraction on your echo was stable at 45-50%  Start spironolactone 25mg  daily - this helps your blood pressure, heart, swelling, potassium level  Decrease your Lasix and potassium pill to 1 pill of each once a day  Mounjaro Counseling Points This medication reduces your appetite and may make you feel fuller longer.  Stop eating when your body tells you that you are full. This will likely happen sooner than you are used to. Fried/greasy food and sweets may upset your stomach - minimize these as much as possible. Store your medication in the fridge until you are ready to use it. Inject your medication in the fatty tissue of your lower abdominal area (2 inches away from belly button) or upper outer thigh. Rotate injection sites. Common side effects include: nausea, diarrhea/constipation, and heartburn, and are more likely to occur if you overeat. Stop your injection for 7 days prior to surgical procedures requiring anesthesia.  I will call you monthly for dose titrations of your Mounjaro. Call Jinny Blossom, PharmD with any questions [phone]  Tips for success: Write down the reasons why you want to lose weight and post it in a place where you'll see it often.  Start small and work your way up. Keep in mind that it takes time to achieve goals, and small steps add up.  Any additional movements help to burn calories. Taking the stairs rather than the elevator and parking at the far end of your parking lot are easy ways to start. Brisk walking for at least 30 minutes 4 or more days of the week is an excellent goal to work toward  Understanding what it means to feel full: Did you know that it can take 15 minutes or more for your brain to receive the message that you've eaten? That means that, if you eat less food, but consume it slower, you may still feel satisfied.  Eating a lot of fruits and vegetables can also help you feel fuller.  Eat off of  smaller plates so that moderate portions don't seem too small  Tips for living a healthier life     Building a Healthy and Balanced Diet Make most of your meal vegetables and fruits -  of your plate. Aim for color and variety, and remember that potatoes don't count as vegetables on the Healthy Eating Plate because of their negative impact on blood sugar.  Go for whole grains -  of your plate. Whole and intact grains--whole wheat, barley, wheat berries, quinoa, oats, brown rice, and foods made with them, such as whole wheat pasta--have a milder effect on blood sugar and insulin than white bread, white rice, and other refined grains.  Protein power -  of your plate. Fish, poultry, beans, and nuts are all healthy, versatile protein sources--they can be mixed into salads, and pair well with vegetables on a plate. Limit red meat, and avoid processed meats such as bacon and sausage.  Healthy plant oils - in moderation. Choose healthy vegetable oils like olive, canola, soy, corn, sunflower, peanut, and others, and avoid partially hydrogenated oils, which contain unhealthy trans fats. Remember that low-fat does not mean "healthy."  Drink water, coffee, or tea. Skip sugary drinks, limit milk and dairy products to one to two servings per day, and limit juice to a small glass per day.  Stay active. The red figure running across the [place] is a reminder that staying active is also important in  weight control.  The main message of the Healthy Eating Plate is to focus on diet quality:  The type of carbohydrate in the diet is more important than the amount of carbohydrate in the diet, because some sources of carbohydrate--like vegetables (other than potatoes), fruits, whole grains, and beans--are healthier than others. The Healthy Eating Plate also advises consumers to avoid sugary beverages, a major source of calories--usually with little nutritional value--in the American  diet. The Healthy Eating Plate encourages consumers to use healthy oils, and it does not set a maximum on the percentage of calories people should get each day from healthy sources of fat. In this way, the Healthy Eating Plate recommends the opposite of the low-fat message promoted for decades by the USDA.  DeskDistributor.no  SUGAR  Sugar is a huge problem in the modern day diet. Sugar is a big contributor to heart disease, diabetes, high triglyceride levels, fatty liver disease and obesity. Sugar is hidden in almost all packaged foods/beverages. Added sugar is extra sugar that is added beyond what is naturally found and has no nutritional benefit for your body. The American Heart Association recommends limiting added sugars to no more than 25g for women and 36 grams for men per day. There are many names for sugar including maltose, sucrose (names ending in "ose"), high fructose corn syrup, molasses, cane sugar, corn sweetener, raw sugar, syrup, honey or fruit juice concentrate.   One of the best ways to limit your added sugars is to stop drinking sweetened beverages such as soda, sweet tea, and fruit juice.  There is 65g of added sugars in one 20oz bottle of Coke! That is equal to 7.5 donuts.   Pay attention and read all nutrition facts labels. Below is an examples of a nutrition facts label. The #1 is showing you the total sugars where the # 2 is showing you the added sugars. This one serving has almost the max amount of added sugars per day!   EXERCISE  Exercise is good. We've all heard that. In an ideal world, we would all have time and resources to get plenty of it. When you are active, your heart pumps more efficiently and you will feel better.  Multiple studies show that even walking regularly has benefits that include living a longer life. The American Heart Association recommends 150 minutes per week of exercise (30 minutes per day most days  of the week). You can do this in any increment you wish. Nine or more 10-minute walks count. So does an hour-long exercise class. Break the time apart into what will work in your life. Some of the best things you can do include walking briskly, jogging, cycling or swimming laps. Not everyone is ready to "exercise." Sometimes we need to start with just getting active. Here are some easy ways to be more active throughout the day:  Take the stairs instead of the elevator  Go for a 10-15 minute walk during your lunch break (find a friend to make it more enjoyable)  When shopping, park at the back of the parking lot  If you take public transportation, get off one stop early and walk the extra distance  Pace around while making phone calls  Check with your doctor if you aren't sure what your limitations may be. Always remember to drink plenty of water when doing any type of exercise. Don't feel like a failure if you're not getting the 90-150 minutes per week. If you started by being a couch potato,  then just a 10-minute walk each day is a huge improvement. Start with little victories and work your way up.   HEALTHY EATING TIPS              Plan ahead: make a menu of the meals for a week then create a grocery list to go with that menu. Consider meals that easily stretch into a night of leftovers, such as stews or casseroles. Or consider making two of your favorite meal and put one in the freezer for another night. Try a night or two each week that is "meatless" or "no cook" such as salads. When you get home from the grocery store wash and prepare your vegetables and fruits. Then when you need them they are ready to go.   Tips for going to the grocery store:  [place] store or generic brands  Check the weekly ad from your store on-line or in their in-store flyer  Look at the unit price on the shelf tag to compare/contrast the costs of different items  Buy fruits/vegetables in season  Carrots, bananas and apples  are low-cost, naturally healthy items  If meats or frozen vegetables are on sale, buy some extras and put in your freezer  Limit buying prepared or "ready to eat" items, even if they are pre-made salads or fruit snacks  Do not shop when you're hungry  Foods at eye level tend to be more expensive. Look on the high and low shelves for deals.  Consider shopping at the farmer's market for fresh foods in season.  Avoid the cookie and chip aisles (these are expensive, high in calories and low in nutritional value). Shop on the outside of the grocery store.  Healthy food preparations:  If you can't get lean hamburger, be sure to drain the fat when cooking  Steam, saut (in olive oil), grill or bake foods  Experiment with different seasonings to avoid adding salt to your foods. Kosher salt, sea salt and Himalayan salt are all still salt and should be avoided. Try seasoning food with onion, garlic, thyme, rosemary, basil ect. Onion powder or garlic powder is ok. Avoid if it says salt (ie garlic salt).

## 2022-05-20 DIAGNOSIS — H35033 Hypertensive retinopathy, bilateral: Secondary | ICD-10-CM | POA: Diagnosis not present

## 2022-05-20 DIAGNOSIS — E113293 Type 2 diabetes mellitus with mild nonproliferative diabetic retinopathy without macular edema, bilateral: Secondary | ICD-10-CM | POA: Diagnosis not present

## 2022-05-20 DIAGNOSIS — H25811 Combined forms of age-related cataract, right eye: Secondary | ICD-10-CM | POA: Diagnosis not present

## 2022-05-20 LAB — HM DIABETES EYE EXAM

## 2022-05-21 ENCOUNTER — Ambulatory Visit (HOSPITAL_BASED_OUTPATIENT_CLINIC_OR_DEPARTMENT_OTHER): Payer: Medicare HMO

## 2022-05-25 ENCOUNTER — Ambulatory Visit: Payer: Self-pay

## 2022-05-25 ENCOUNTER — Ambulatory Visit: Payer: Medicare HMO | Attending: Interventional Cardiology

## 2022-05-25 DIAGNOSIS — I5022 Chronic systolic (congestive) heart failure: Secondary | ICD-10-CM

## 2022-05-25 NOTE — Patient Instructions (Signed)
Visit Information  Thank you for taking time to visit with me today. Please don't hesitate to contact me if I can be of assistance to you.   Following are the goals we discussed today:  -Contact [facility] at [phone] regarding a ramp   Your next appointment is by telephone on 5/16 at 10:30 am with [name]  Please call the care guide team at [phone] if you need to cancel or reschedule your appointment.   If you are experiencing a Mental Health or Behavioral Health Crisis or need someone to talk to, please go to [facility] [address], [place] [phone])  Patient verbalizes understanding of instructions and care plan provided today and agrees to view in MyChart. Active MyChart status and patient understanding of how to access instructions and care plan via MyChart confirmed with patient.     [name], [name], CDP Social Worker, Certified Dementia Practitioner [facility] Care Management  Care Coordination [phone]

## 2022-05-25 NOTE — Patient Outreach (Signed)
  Care Coordination   Initial Visit Note   05/25/2022 Name: Thomas [name] MRN: [id number] DOB: 06-22-1950  Thomas [name] is a 72 y.o. year old male who sees [name], Thomas Harvey, NP for primary care. I spoke with  Thomas [name] by phone today.  What matters to the patients health and wellness today?  Scheduled patient with RN Care Manager for disease management    Goals Addressed             This Visit's Progress    COMPLETED: Care Coordination Activities       Care Coordination Interventions: Determined the patient would like to install a ramp to the front porch if possible. Patients current ramp in in the backyard and it is hard to control his dog while accessing the ramp  Educated the patient on [place] [employer] - patient to call [phone] to determine eligibility Introduced patient to role of Care Coordination team - patient scheduled to speak with RN Care Manager 5/16 at 10:30am Encouraged the patient to contact his primary care provider as needed         SDOH assessments and interventions completed:  Yes  SDOH Interventions Today    Flowsheet Row Most Recent Value  SDOH Interventions   Food Insecurity Interventions Intervention Not Indicated  Housing Interventions Other (Comment)  [NCBAM for a ramp out from door]        Care Coordination Interventions:  Yes, provided   Interventions Today    Flowsheet Row Most Recent Value  Chronic Disease   Chronic disease during today's visit Hypertension (HTN), Diabetes, Atrial Fibrillation (AFib)  General Interventions   General Interventions Discussed/Reviewed General Interventions Discussed, Referral to Nurse, Community Resources        Follow up plan: Follow up call scheduled for 5/16 with RN Care Manager [name]    Encounter Outcome:  Pt. Visit Completed   [name], [name], CDP Social Worker, Certified Dementia Practitioner [facility] Care Management  Care  Coordination [phone]

## 2022-05-27 ENCOUNTER — Other Ambulatory Visit: Payer: Self-pay

## 2022-05-27 ENCOUNTER — Telehealth: Payer: Self-pay | Admitting: Pharmacist

## 2022-05-27 ENCOUNTER — Other Ambulatory Visit (HOSPITAL_COMMUNITY): Payer: Self-pay

## 2022-05-27 DIAGNOSIS — E1122 Type 2 diabetes mellitus with diabetic chronic kidney disease: Secondary | ICD-10-CM

## 2022-05-27 LAB — BASIC METABOLIC PANEL
BUN/Creatinine Ratio: 12 (ref 10–24)
BUN: 13 mg/dL (ref 8–27)
CO2: 19 mmol/L — ABNORMAL LOW (ref 20–29)
Calcium: 9 mg/dL (ref 8.6–10.2)
Chloride: 107 mmol/L — ABNORMAL HIGH (ref 96–106)
Creatinine, Ser: 1.13 mg/dL (ref 0.76–1.27)
Glucose: 112 mg/dL — ABNORMAL HIGH (ref 70–99)
Potassium: 3.7 mmol/L (ref 3.5–5.2)
Sodium: 144 mmol/L (ref 134–144)
eGFR: 69 mL/min/{1.73_m2} (ref >59)

## 2022-05-27 MED ORDER — MOUNJARO 5 MG/0.5ML ~~LOC~~ SOAJ
5.0000 mg | SUBCUTANEOUS | 0 refills | Status: DC
  Filled 2022-05-27: qty 2, 28d supply, fill #0

## 2022-05-27 MED ORDER — SPIRONOLACTONE 25 MG PO TABS: 25.0000 mg | ORAL_TABLET | Freq: Every day | ORAL | 3 refills | Status: DC

## 2022-05-27 NOTE — Telephone Encounter (Signed)
Called pt to follow up with labs. BMET stable on spironolactone. K 3.7, prior hypoK, will continue his K supplementation for now. Reports home BP 130-150/80s. Has been making dietary improvements including cutting out fast food, bread, sweets, snacks on low sodium triscuits, occasional canned fruit.  Advised pt to monitor BP more consistently, sees pulm for f/u on 4/22 and will have BP checked there. I'll call pt after to discuss, may need dose increase of spironolactone.   However, also on Mounjaro so anticipate with future weight loss that BP will improve too. Pt reports glucose readings have improved, ranging 77 to < 100 fasting in the AM. Was 123 after eating some trail mix. Tolerating Mounjaro well so far, no GI side effects. Will increase to 5mg  weekly for the next month. Will decrease his glipizide from 5mg  to 2.5mg  daily and hopefully can stop next month. Advised him to call me if glucose < 70 in the AM and would stop glipizide earlier.

## 2022-05-29 ENCOUNTER — Other Ambulatory Visit: Payer: Self-pay | Admitting: Nurse Practitioner

## 2022-05-29 ENCOUNTER — Other Ambulatory Visit (HOSPITAL_COMMUNITY): Payer: Self-pay

## 2022-05-29 DIAGNOSIS — M170 Bilateral primary osteoarthritis of knee: Secondary | ICD-10-CM

## 2022-05-29 MED ORDER — TRAMADOL HCL 50 MG PO TABS
50.0000 mg | ORAL_TABLET | Freq: Four times a day (QID) | ORAL | 0 refills | Status: DC | PRN
Start: 2022-05-29 — End: 2022-06-29
  Filled 2022-05-29: qty 30, 8d supply, fill #0

## 2022-05-29 NOTE — Telephone Encounter (Signed)
Patient has request refill on medication Tramadol 50mg . Patient medication last refilled 03/24/2022. Patient has Non Opioid Contract on file dated 06/02/2021. Patient has upcoming appointment 06/29/2022. Update contract added to patient appointment notes. Medication pend and sent to PCP Sharon Seller, NP.

## 2022-06-08 ENCOUNTER — Encounter (HOSPITAL_BASED_OUTPATIENT_CLINIC_OR_DEPARTMENT_OTHER): Payer: Self-pay | Admitting: Pulmonary Disease

## 2022-06-08 ENCOUNTER — Telehealth: Payer: Self-pay | Admitting: Pulmonary Disease

## 2022-06-08 ENCOUNTER — Ambulatory Visit (HOSPITAL_BASED_OUTPATIENT_CLINIC_OR_DEPARTMENT_OTHER): Payer: Medicare HMO | Admitting: Pulmonary Disease

## 2022-06-08 VITALS — BP 164/88 | HR 72 | Temp 98.7°F | Ht 69.0 in | Wt 330.0 lb

## 2022-06-08 DIAGNOSIS — G4733 Obstructive sleep apnea (adult) (pediatric): Secondary | ICD-10-CM | POA: Diagnosis not present

## 2022-06-08 DIAGNOSIS — I2724 Chronic thromboembolic pulmonary hypertension: Secondary | ICD-10-CM

## 2022-06-08 DIAGNOSIS — R0609 Other forms of dyspnea: Secondary | ICD-10-CM

## 2022-06-08 DIAGNOSIS — R590 Localized enlarged lymph nodes: Secondary | ICD-10-CM

## 2022-06-08 DIAGNOSIS — J9611 Chronic respiratory failure with hypoxia: Secondary | ICD-10-CM

## 2022-06-08 NOTE — Telephone Encounter (Signed)
Okay to change DME to [facility].

## 2022-06-08 NOTE — Progress Notes (Signed)
[place] [facility]  Chief Complaint  Patient presents with   Consult    Consult. Here to talk about sleep apnea and breathing.     Past Surgical History:  He  has a past surgical history that includes Knee surgery; Shoulder surgery; Hernia repair; Prostate surgery; uretha surgery-2014; Radioactive seed implant; Colonoscopy; Multiple tooth extractions; Total knee arthroplasty (Right, 11/19/2016); Cardioversion (N/A, 06/03/2017); RIGHT/LEFT HEART CATH AND CORONARY ANGIOGRAPHY (N/A, 07/06/2017); Colonoscopy with propofol (N/A, 10/20/2018); polypectomy (10/20/2018); and Cataract extraction (Left, 07/31/2021).  Past Medical History:  Diastolic CHF, Arthritis, Prostate cancer, CKD, Neuropathy, DVT, GERD, HTN, PNA, Suprapubic catheter  Constitutional:  BP (!) 164/88 (BP Location: Right Wrist, Patient Position: Sitting, Cuff Size: Normal)   Pulse 72   Temp 98.7 F (37.1 C) (Oral)   Ht 5\' 9"  (1.753 m)   Wt (!) 330 lb (149.7 kg)   SpO2 91%   BMI 48.73 kg/m   Brief Summary:  Thomas [name] is a 72 y.o. male with obstructive sleep apnea, chronic respiratory failure and CTEPH.      Subjective:   He is here with his wife.  I last saw him in 2019.  He had a Respironics CPAP machine, and was using it until his daughter told him about the recall.  He wasn't able to get a replacement device from Respironics.  He was finally able to get a CPAP machine from his daughter, and has been using this.  He hasn't received new supplies for a while and doesn't have contact with a DME anymore.  He is still using oxygen at night with CPAP, but has to wear his nasal cannula with his CPAP mask over this.  He has been getting more short of breath.  He finds it difficult to do even basic activities now.  He tries to exercise but this is a struggle.  He was tried on symbicort, but this didn't help.  He had CT chest that showed borderline mediastinal lymph node  enlargement, but had progressed from before.  He was seen by cardiology recently and told his heart function is stable.  Physical Exam:   Appearance - well kempt   ENMT - no sinus tenderness, no oral exudate, no LAN, Mallampati 3 airway, no stridor  Respiratory - equal breath sounds bilaterally, no wheezing or rales  CV - s1s2 regular rate and rhythm, no murmurs  Ext - no clubbing, no edema  Skin - no rashes  Psych - normal mood and affect   Pulmonary testing:  Spirometry 10/19/17 >> FEV1 2.5 (81%), FEV1% 91  Chest Imaging:  CT angio chest 11/23/15 >> PE Rt side CT angio chest 03/03/16 >> chronic PE on Rt CT angio chest 04/02/17 >> atherosclerosis CT angio chest 03/23/22 >> borderline LAN, no PE  Sleep Tests:  HST 09/08/17 >> AHI 77.9, SaO2 low 70% Bipap titration 09/18/17 >> Bipap 19/13 cm H2O >> AHI 8.5, didn't need supplemental oxygen. Bipap 11/14/17 to 12/13/17 >> used on 30 of 30 nights with average 8 hrs 25 min.  Average AHI 2.3 with Bipap 19/13 cm H2O  Cardiac Tests:  Echo 06/02/17 >> EF 45 to 50%, PAS 35 mmHg RHC/LHC 07/06/17 >> non obstructive CAD, mod/severe pulmonary HTN Echo 05/13/22 >> EF 45 to 50%, mod LVH, grade 1 DD, mod elevation in PASP, mild/mod LA dilation, mod RA dilation  Social History:  He  reports that he has never smoked. He has never used smokeless tobacco. He reports that he  does not drink alcohol and does not use drugs.  Family History:  His family history includes Breast cancer (age of onset: 92) in his sister; Breast cancer (age of onset: 79) in his maternal aunt; Breast cancer (age of onset: 82) in his mother; Cancer (age of onset: 51) in his sister; Cancer (age of onset: 38) in his maternal uncle; Cervical cancer (age of onset: 58) in an other family member; Leukemia (age of onset: 71) in his brother; Lung cancer in his maternal uncle; Other (age of onset: 67) in his brother; Prostate cancer in his brother and paternal uncle.     Labs:       Latest Ref Rng & Units 05/25/2022    9:19 AM 05/08/2022    8:33 AM 05/01/2022   10:42 AM  CMP  Glucose 70 - 99 mg/dL 161  096  045   BUN 8 - 27 mg/dL 13  14  12    Creatinine 0.76 - 1.27 mg/dL 4.09  8.11  9.14   Sodium 134 - 144 mmol/L 144  142  144   Potassium 3.5 - 5.2 mmol/L 3.7  3.8  3.4   Chloride 96 - 106 mmol/L 107  104  105   CO2 20 - 29 mmol/L 19  25  23    Calcium 8.6 - 10.2 mg/dL 9.0  9.4  9.0        Latest Ref Rng & Units 03/23/2022    8:54 AM 09/29/2021   10:18 AM 05/08/2021    6:15 PM  CBC  WBC 4.0 - 10.5 K/uL 6.4  5.9  8.2   Hemoglobin 13.0 - 17.0 g/dL 78.2  95.6  21.3   Hematocrit 39.0 - 52.0 % 43.8  41.8  43.1   Platelets 150 - 400 K/uL 222  254  248    ProBNP (last 3 results) Recent Labs    05/01/22 1042  PROBNP 1,160*    Assessment/Plan:   Dyspnea on exertion. - likely combination of combined CHF, and obesity with deconditioning - he is concerned possible exposure from his Respironics CPAP machine could be contributing to his symptoms - his recent CT chest showed mediastinal lymph node enlargement of uncertain significance - will arrange for pulmonary function test and CT chest with IV contrast  Obstructive sleep apnea. - he reports benefit from CPAP and compliance with therapy - he has a relatively new Resmed CPAP machine from his daughter - will see if he can get set up with [facility] for his DME - will arrange for a split night sleep study to assess current status of sleep apnea and also get an sense about where to start with for his CPAP settings  Chronic respiratory failure with hypoxia. - will assess his O2 needs during sleep study  Obesity. - he is on mounjaro for his diabetes   Chronic combined CHF. - followed by Dr. Everette Rank with cardiology  CTEPH. - continue xarelto  Prostate cancer s/p suprapubic catheter. - followed by urology with [facility]  Time Spent Involved in Patient Care on Day of Examination:  53 minutes  Follow up:    Patient Instructions  Will arrange for CT chest, pulmonary function test and in lab sleep study  Will try to get you set up with [facility] to get new CPAP mask and supplies  Follow up in 2 months  Medication List:   Allergies as of 06/08/2022       Reactions   Lisinopril Cough, Other (See Comments)  Muscle pain        Medication List        Accurate as of June 08, 2022  4:37 PM. If you have any questions, ask your nurse or doctor.          STOP taking these medications    ASHWAGANDHA PO Stopped by: Coralyn Helling, MD   Symbicort 160-4.5 MCG/ACT inhaler Generic drug: budesonide-formoterol Stopped by: Coralyn Helling, MD   UNABLE TO FIND Stopped by: Coralyn Helling, MD   UNABLE TO FIND Stopped by: Coralyn Helling, MD       TAKE these medications    Accu-Chek Aviva Plus test strip Generic drug: glucose blood Use to test blood sugar daily.   Accu-Chek Aviva Soln Use once daily as directed dx E11.22   Accu-Chek Softclix Lancet Dev Kit Use to test blood sugar daily   Accu-Chek Softclix Lancets lancets Use to test blood sugar daily   TRUEplus Lancets 28G Misc 1 Device by Other route daily as needed. DX:E11.22   acetaminophen 500 MG tablet Commonly known as: TYLENOL Take by mouth.   amLODipine 10 MG tablet Commonly known as: NORVASC TAKE 1 TABLET EVERY DAY FOR HIGH BLOOD PRESSURE   aspirin EC 81 MG tablet Take 81 mg by mouth daily.   atorvastatin 40 MG tablet Commonly known as: LIPITOR TAKE 1 TABLET EVERY DAY   B-D SINGLE USE SWABS REGULAR Pads Use in testing blood sugar daily. Dx: E11.22   carvedilol 12.5 MG tablet Commonly known as: COREG TAKE 1 TABLET TWICE DAILY WITH MEALS   empagliflozin 10 MG Tabs tablet Commonly known as: JARDIANCE Take 1 tablet (10 mg total) by mouth daily.   furosemide 40 MG tablet Commonly known as: LASIX TAKE 1 TABLET (40 MG TOTAL) BY MOUTH DAILY.   glipiZIDE 5 MG tablet Commonly known as: GLUCOTROL Take 0.5  tablets (2.5 mg total) by mouth daily before breakfast.   hydrALAZINE 25 MG tablet Commonly known as: APRESOLINE TAKE 3 TABLETS THREE TIMES DAILY   losartan 50 MG tablet Commonly known as: COZAAR TAKE 1 TABLET (50 MG TOTAL) BY MOUTH DAILY.   metFORMIN 500 MG tablet Commonly known as: GLUCOPHAGE TAKE 1 TABLET TWICE DAILY WITH MEALS   methocarbamol 500 MG tablet Commonly known as: ROBAXIN Take 1 tablet (500 mg total) by mouth every 8 (eight) hours as needed for muscle spasms.   Mounjaro 5 MG/0.5ML Pen Generic drug: tirzepatide Inject 5 mg into the skin once a week.   potassium chloride SA 20 MEQ tablet Commonly known as: KLOR-CON M TAKE 1 TABLET TWICE DAILY   rivaroxaban 20 MG Tabs tablet Commonly known as: XARELTO Take 1 tablet (20 mg total) by mouth daily with supper.   Senna 8.7 MG Chew Chew 1 tablet by mouth as needed.   senna-docusate 8.6-50 MG tablet Commonly known as: Senokot-S Take 1 tablet by mouth daily as needed.   spironolactone 25 MG tablet Commonly known as: ALDACTONE Take 1 tablet (25 mg total) by mouth daily.   SUPER GREENS PO Take 1 tablet by mouth 2 (two) times daily. Brand: Goli, gummy   traMADol 50 MG tablet Commonly known as: ULTRAM Take 1 tablet (50 mg total) by mouth every 6 (six) hours as needed for pain   True Metrix Air Glucose Meter w/Device Kit 1 Device by Does not apply route daily as needed. E11.22   vitamin C 1000 MG tablet Take 2 tablets (2,000 mg total) by mouth daily.   Vitamin D3 50 MCG (2000  UT) Tabs Take 1 tablet (2,000 Units) by mouth daily.   zinc gluconate 50 MG tablet Take 1 tablet (50 mg total) by mouth daily.        Signature:  Coralyn Helling, MD [facility] Pulmonary/Critical Care Pager - [phone] 06/08/2022, 4:37 PM

## 2022-06-08 NOTE — Patient Instructions (Signed)
Will arrange for CT chest, pulmonary function test and in lab sleep study  Will try to get you set up with [facility] to get new CPAP mask and supplies  Follow up in 2 months

## 2022-06-08 NOTE — Telephone Encounter (Signed)
Due to his [facility] insurance plan pt is OON with [facility] and will need to use [facility] for his DME   FYI - will call pt

## 2022-06-10 ENCOUNTER — Other Ambulatory Visit (HOSPITAL_COMMUNITY): Payer: Self-pay

## 2022-06-10 ENCOUNTER — Telehealth: Payer: Self-pay | Admitting: Pharmacist

## 2022-06-10 ENCOUNTER — Other Ambulatory Visit: Payer: Self-pay | Admitting: Nurse Practitioner

## 2022-06-10 MED ORDER — ACCU-CHEK SOFTCLIX LANCETS MISC
3 refills | Status: DC
  Filled 2022-06-10 (×2): qty 100, 100d supply, fill #0

## 2022-06-10 NOTE — Telephone Encounter (Signed)
Called pt to f/u with BP and GLP.  BP remained elevated at 164/88 at the pulmonologist office on 4/22. Home readings above 130/80 as well. Will increase spironolactone from  to  daily. He has 90 day rx of the  dose so he will double up and take 2 a day. Will send message to PCP to see if BMET can be rechecked at appt on 5/13. Has continued on potassium supplementation due to low K in the past, will continue this for now but hopefully will be able to d/c after BMET with dose increase of spironolactone.  Tolerating his Mounjaro 2.5mg  well, gives first dose of  this coming Monday. Fasting glucose has remained < 100 consistently since decreasing his glipizide from  to 2.5mg  after Greggory Keen was started. In anticipation of further glucose lowering with next dose of Mounjaro, will have him stop his glipizide completely. He will continue to monitor his glucose at home.

## 2022-06-11 ENCOUNTER — Other Ambulatory Visit: Payer: Self-pay

## 2022-06-11 ENCOUNTER — Ambulatory Visit (HOSPITAL_BASED_OUTPATIENT_CLINIC_OR_DEPARTMENT_OTHER)
Admission: RE | Admit: 2022-06-11 | Discharge: 2022-06-11 | Disposition: A | Discharge status: Home / Self Care | Payer: Medicare HMO | Source: Ambulatory Visit | Attending: Pulmonary Disease | Admitting: Pulmonary Disease

## 2022-06-11 DIAGNOSIS — R590 Localized enlarged lymph nodes: Secondary | ICD-10-CM | POA: Diagnosis not present

## 2022-06-11 DIAGNOSIS — R918 Other nonspecific abnormal finding of lung field: Secondary | ICD-10-CM | POA: Diagnosis not present

## 2022-06-11 MED ORDER — IOHEXOL 300 MG/ML  SOLN
100.0000 mL | Freq: Once | INTRAMUSCULAR | Status: AC | PRN
  Administered 2022-06-11: 75 mL via INTRAVENOUS

## 2022-06-15 DIAGNOSIS — R339 Retention of urine, unspecified: Secondary | ICD-10-CM | POA: Diagnosis not present

## 2022-06-25 ENCOUNTER — Other Ambulatory Visit (HOSPITAL_COMMUNITY): Payer: Self-pay

## 2022-06-28 ENCOUNTER — Ambulatory Visit (HOSPITAL_BASED_OUTPATIENT_CLINIC_OR_DEPARTMENT_OTHER): Payer: Medicare HMO | Attending: Pulmonary Disease | Admitting: Pulmonary Disease

## 2022-06-28 DIAGNOSIS — G4733 Obstructive sleep apnea (adult) (pediatric): Secondary | ICD-10-CM | POA: Diagnosis not present

## 2022-06-28 DIAGNOSIS — J9611 Chronic respiratory failure with hypoxia: Secondary | ICD-10-CM

## 2022-06-29 ENCOUNTER — Ambulatory Visit (INDEPENDENT_AMBULATORY_CARE_PROVIDER_SITE_OTHER): Payer: Medicare HMO | Admitting: Nurse Practitioner

## 2022-06-29 ENCOUNTER — Other Ambulatory Visit (HOSPITAL_COMMUNITY): Payer: Self-pay

## 2022-06-29 ENCOUNTER — Encounter: Payer: Self-pay | Admitting: Nurse Practitioner

## 2022-06-29 VITALS — BP 126/80 | HR 74 | Temp 97.1°F | Ht 69.0 in | Wt 312.0 lb

## 2022-06-29 DIAGNOSIS — I825Y2 Chronic embolism and thrombosis of unspecified deep veins of left proximal lower extremity: Secondary | ICD-10-CM | POA: Diagnosis not present

## 2022-06-29 DIAGNOSIS — M170 Bilateral primary osteoarthritis of knee: Secondary | ICD-10-CM

## 2022-06-29 DIAGNOSIS — R339 Retention of urine, unspecified: Secondary | ICD-10-CM

## 2022-06-29 DIAGNOSIS — I10 Essential (primary) hypertension: Secondary | ICD-10-CM | POA: Diagnosis not present

## 2022-06-29 DIAGNOSIS — E1122 Type 2 diabetes mellitus with diabetic chronic kidney disease: Secondary | ICD-10-CM | POA: Diagnosis not present

## 2022-06-29 DIAGNOSIS — I700 Atherosclerosis of aorta: Secondary | ICD-10-CM

## 2022-06-29 DIAGNOSIS — E785 Hyperlipidemia, unspecified: Secondary | ICD-10-CM

## 2022-06-29 DIAGNOSIS — I5022 Chronic systolic (congestive) heart failure: Secondary | ICD-10-CM

## 2022-06-29 DIAGNOSIS — R0602 Shortness of breath: Secondary | ICD-10-CM | POA: Diagnosis not present

## 2022-06-29 MED ORDER — TRAMADOL HCL 50 MG PO TABS
50.0000 mg | ORAL_TABLET | Freq: Four times a day (QID) | ORAL | 0 refills | Status: DC | PRN
Start: 2022-06-29 — End: 2022-08-18
  Filled 2022-06-29: qty 30, 8d supply, fill #0

## 2022-06-29 NOTE — Progress Notes (Signed)
Careteam: Patient Care Team: Sharon Seller, NP as PCP - General (Geriatric Medicine) Corky Crafts, MD as PCP - Cardiology (Cardiology) Dione Booze, Bertram Millard, MD as Consulting Physician (Ophthalmology) Clarene Duke, Karma Lew, RN as [facility] Care Management  PLACE OF SERVICE:  [facility]  Advanced Directive information Does Patient Have a Medical Advance Directive?: No, Would patient like information on creating a medical advance directive?: No - Patient declined  Allergies  Allergen Reactions   Lisinopril Cough and Other (See Comments)    Muscle pain    Chief Complaint  Patient presents with   Medical Management of Chronic Issues    3 month follow-up. Discuss need for td/tdap, shingrix, diabetic kidney evaluation, eye exam, and coivd boosters. Sign treatment agreement for tramadol. Here with spouse.      HPI: Patient is a 72 y.o. male for routine follow up.   He has been doing well on monjauro, stopped glipizide, blood sugars 80-120s Reports he is well controlled on. Daughters and wife and him have made a commitment  to stop eating sweets and bread, increase physical activity. Knows what needs to be done and trying to put into practice.   Saw a different orthopedic and wanted to do surgery on his knees but wanted to wait.  Working on getting weight down.   Htn- better control at this time, home readings are good as well. He increase spironolactone to 50 mg daily from 25- due for follow up lab today.   Has sleep study last night. He said he could not go to sleep.   Continue to follow up with urology, has leg bag due to chronic urinary retention.  Review of Systems:  Review of Systems  Constitutional:  Negative for chills, fever and weight loss.  HENT:  Negative for tinnitus.   Respiratory:  Positive for shortness of breath. Negative for cough and sputum production.   Cardiovascular:  Negative for chest pain, palpitations and leg swelling.   Gastrointestinal:  Negative for abdominal pain, constipation, diarrhea and heartburn.  Genitourinary:  Negative for dysuria, frequency and urgency.  Musculoskeletal:  Positive for joint pain. Negative for back pain, falls and myalgias.  Skin: Negative.   Neurological:  Negative for dizziness and headaches.  Psychiatric/Behavioral:  Negative for depression and memory loss. The patient does not have insomnia.     Past Medical History:  Diagnosis Date   Acute on chronic diastolic CHF (congestive heart failure) (HCC) 06/01/2017   Arthritis    Cancer (HCC) 2010   Prostate   Chronic kidney disease    Diabetes mellitus    Diabetic neuropathy (HCC)    feet   DVT (deep venous thrombosis) (HCC)    Genetic testing 06/22/2016   Mr. Loga underwent genetic counseling and testing for hereditary cancer syndromes on 05/14/2016. His results were negative for mutations in all 46 genes analyzed by [employer]'s 46-gene Common Hereditary Cancers Panel. Genes analyzed include: APC, ATM, AXIN2, BARD1, BMPR1A, BRCA1, BRCA2, BRIP1, CDH1, CDKN2A, CHEK2, CTNNA1, DICER1, EPCAM, GREM1, HOXB13, KIT, MEN1, MLH1, MSH2, MSH3, MSH6, MUTYH, NB   GERD (gastroesophageal reflux disease)    Hypertension    PE (pulmonary thromboembolism) (HCC)    Pneumonia    Sleep apnea    not wearing CPAP   Suprapubic catheter (HCC) 11/17/2019   Past Surgical History:  Procedure Laterality Date   CARDIOVERSION N/A 06/03/2017   Procedure: CARDIOVERSION;  Surgeon: Jake Bathe, MD;  Location: [facility] ENDOSCOPY;  Service: Cardiovascular;  Laterality: N/A;  CATARACT EXTRACTION Left 07/31/2021   Dr.Glenn, [facility]   COLONOSCOPY     COLONOSCOPY WITH PROPOFOL N/A 10/20/2018   Procedure: COLONOSCOPY WITH PROPOFOL;  Surgeon: Tressia Danas, MD;  Location: [facility]COPY;  Service: Gastroenterology;  Laterality: N/A;   HERNIA REPAIR     KNEE SURGERY     MULTIPLE TOOTH EXTRACTIONS     POLYPECTOMY  10/20/2018   Procedure:  POLYPECTOMY;  Surgeon: Tressia Danas, MD;  Location: [facility] ENDOSCOPY;  Service: Gastroenterology;;   PROSTATE SURGERY     RADIOACTIVE SEED IMPLANT     RIGHT/LEFT HEART CATH AND CORONARY ANGIOGRAPHY N/A 07/06/2017   Procedure: RIGHT/LEFT HEART CATH AND CORONARY ANGIOGRAPHY;  Surgeon: Corky Crafts, MD;  [facility]: [facility] INVASIVE CV LAB;  Service: Cardiovascular;  Laterality: N/A;   SHOULDER SURGERY     TOTAL KNEE ARTHROPLASTY Right 11/19/2016   Procedure: RIGHT TOTAL KNEE ARTHROPLASTY;  Surgeon: Tarry Kos, MD;  Location: [facility] OR;  Service: Orthopedics;  Laterality: Right;   uretha surgery-2014     Social History:   reports that he has never smoked. He has never used smokeless tobacco. He reports that he does not drink alcohol and does not use drugs.  Family History  Problem Relation Age of Onset   Breast cancer Mother 57       d.89   Breast cancer Sister 30       d.30   Leukemia Brother 18       d.20   Breast cancer Maternal Aunt 40       d.40s   Lung cancer Maternal Uncle    Prostate cancer Paternal Uncle    Prostate cancer Brother        recurred recently at age 40   Other Brother 15       spinal tumor   Cervical cancer Other 22       d.22   Cancer Sister 21       unspecified type   Cancer Maternal Uncle 83       unspecified type    Medications: Patient's Medications  New Prescriptions   No medications on file  Previous Medications   ACCU-CHEK SOFTCLIX LANCETS LANCETS    Use to test blood sugar daily   ACETAMINOPHEN (TYLENOL) 500 MG TABLET    Take by mouth.   ALCOHOL SWABS (B-D SINGLE USE SWABS REGULAR) PADS    Use in testing blood sugar daily. Dx: E11.22   AMLODIPINE (NORVASC) 10 MG TABLET    TAKE 1 TABLET EVERY DAY FOR HIGH BLOOD PRESSURE   ASCORBIC ACID (VITAMIN C) 1000 MG TABLET    Take 2 tablets (2,000 mg total) by mouth daily.   ASPIRIN EC 81 MG TABLET    Take 81 mg by mouth daily.   ATORVASTATIN (LIPITOR) 40 MG TABLET    TAKE 1 TABLET EVERY DAY   BLOOD  GLUCOSE CALIBRATION (ACCU-CHEK AVIVA) SOLN    Use once daily as directed dx E11.22   BLOOD GLUCOSE MONITORING SUPPL (TRUE METRIX AIR GLUCOSE METER) W/DEVICE KIT    1 Device by Does not apply route daily as needed. E11.22   CARVEDILOL (COREG) 12.5 MG TABLET    TAKE 1 TABLET TWICE DAILY WITH MEALS   CHOLECALCIFEROL (VITAMIN D3) 50 MCG (2000 UT) TABS    Take 1 tablet (2,000 Units) by mouth daily.   EMPAGLIFLOZIN (JARDIANCE) 10 MG TABS TABLET    Take 1 tablet (10 mg total) by mouth daily.   FUROSEMIDE (LASIX) 40  MG TABLET    TAKE 1 TABLET (40 MG TOTAL) BY MOUTH DAILY.   GLUCOSE BLOOD (ACCU-CHEK AVIVA PLUS) TEST STRIP    Use to test blood sugar daily.   HYDRALAZINE (APRESOLINE) 25 MG TABLET    TAKE 3 TABLETS THREE TIMES DAILY   LANCETS MISC. (ACCU-CHEK SOFTCLIX LANCET DEV) KIT    Use to test blood sugar daily   LOSARTAN (COZAAR) 50 MG TABLET    TAKE 1 TABLET (50 MG TOTAL) BY MOUTH DAILY.   METFORMIN (GLUCOPHAGE) 500 MG TABLET    TAKE 1 TABLET TWICE DAILY WITH MEALS   METHOCARBAMOL (ROBAXIN) 500 MG TABLET    Take 1 tablet (500 mg total) by mouth every 8 (eight) hours as needed for muscle spasms.   MISC NATURAL PRODUCTS (SUPER GREENS PO)    Take 1 tablet by mouth 2 (two) times daily. Brand: Goli, gummy   POTASSIUM CHLORIDE SA (KLOR-CON M) 20 MEQ TABLET    TAKE 1 TABLET TWICE DAILY   RIVAROXABAN (XARELTO) 20 MG TABS TABLET    Take 1 tablet (20 mg total) by mouth daily with supper.   SENNA 8.7 MG CHEW    Chew 1 tablet by mouth as needed.   SENNA-DOCUSATE (SENOKOT-S) 8.6-50 MG TABLET    Take 1 tablet by mouth daily as needed.   SPIRONOLACTONE (ALDACTONE) 25 MG TABLET    Take 2 tablets (50 mg total) by mouth daily.   TIRZEPATIDE (MOUNJARO) 5 MG/0.5ML PEN    Inject 5 mg into the skin once a week.   TRUEPLUS LANCETS 28G MISC    1 Device by Other route daily as needed. DX:E11.22   ZINC GLUCONATE 50 MG TABLET    Take 1 tablet (50 mg total) by mouth daily.  Modified Medications   Modified Medication Previous  Medication   TRAMADOL (ULTRAM) 50 MG TABLET traMADol (ULTRAM) 50 MG tablet      Take 1 tablet (50 mg total) by mouth every 6 (six) hours as needed for pain    Take 1 tablet (50 mg total) by mouth every 6 (six) hours as needed for pain  Discontinued Medications   No medications on file    Physical Exam:  Vitals:   06/29/22 0951  BP: 126/80  Pulse: 74  Temp: (!) 97.1 F (36.2 C)  TempSrc: Temporal  SpO2: 92%  Weight: (!) 312 lb (141.5 kg)  Height: 5\' 9"  (1.753 m)   Body mass index is 46.07 kg/m. Wt Readings from Last 3 Encounters:  06/29/22 (!) 312 lb (141.5 kg)  06/29/22 (!) 330 lb (149.7 kg)  06/08/22 (!) 330 lb (149.7 kg)    Physical Exam Constitutional:      General: He is not in acute distress.    Appearance: He is well-developed. He is not diaphoretic.  HENT:     Head: Normocephalic and atraumatic.     Right Ear: External ear normal.     Left Ear: External ear normal.     Mouth/Throat:     Pharynx: No oropharyngeal exudate.  Eyes:     Conjunctiva/sclera: Conjunctivae normal.     Pupils: Pupils are equal, round, and reactive to light.  Cardiovascular:     Rate and Rhythm: Normal rate and regular rhythm.     Heart sounds: Normal heart sounds.  Pulmonary:     Effort: Pulmonary effort is normal.     Breath sounds: Normal breath sounds.  Abdominal:     General: Bowel sounds are normal.  Palpations: Abdomen is soft.  Musculoskeletal:        General: Deformity (unable to strighten legs at knees, decrease ROM) present. No tenderness.     Cervical back: Normal range of motion and neck supple.     Right lower leg: No edema.     Left lower leg: No edema.  Skin:    General: Skin is warm and dry.  Neurological:     Mental Status: He is alert and oriented to person, place, and time.     Labs reviewed: Basic Metabolic Panel: Recent Labs    05/01/22 1042 05/08/22 0833 05/25/22 0919  NA 144 142 144  K 3.4* 3.8 3.7  CL 105 104 107*  CO2 23 25 19*  GLUCOSE  164* 168* 112*  BUN 12 14 13   CREATININE 1.06 1.22 1.13  CALCIUM 9.0 9.4 9.0   Liver Function Tests: Recent Labs    09/29/21 1018  AST 8*  ALT 13  BILITOT 0.7  PROT 6.8   No results for input(s): "LIPASE", "AMYLASE" in the last 8760 hours. No results for input(s): "AMMONIA" in the last 8760 hours. CBC: Recent Labs    09/29/21 1018 03/23/22 0854  WBC 5.9 6.4  NEUTROABS 4,372 5.0  HGB 13.6 13.6  HCT 41.8 43.8  MCV 86.9 87.1  PLT 254 222   Lipid Panel: Recent Labs    03/30/22 1524  CHOL 136  HDL 58  LDLCALC 62  TRIG 84  CHOLHDL 2.3   TSH: No results for input(s): "TSH" in the last 8760 hours. A1C: Lab Results  Component Value Date   HGBA1C 8.6 (H) 03/30/2022     Assessment/Plan 1. Primary osteoarthritis of both knees -ongoing, has followed with orthopedic but wants to lose weight prior to any additional intervention. - traMADol (ULTRAM) 50 MG tablet; Take 1 tablet (50 mg total) by mouth every 6 (six) hours as needed for pain  Dispense: 30 tablet; Refill: 0  2. Primary hypertension -Blood pressure well controlled, goal bp <140/90 Continue current medications and dietary modifications follow metabolic panel -improved bp with increase in aldactone, will follow up labs today - Complete Metabolic Panel with eGFR  3. Chronic systolic heart failure (HCC) -evuolvemic, continues current regimen.  - Complete Metabolic Panel with eGFR  4. Type 2 diabetes mellitus with chronic kidney disease, without long-term current use of insulin, unspecified CKD stage (HCC) -Encouraged dietary compliance, routine foot care/monitoring and to keep up with diabetic eye exams through ophthalmology  -continue current medications as prescribed.  - Hemoglobin A1c - Microalbumin/Creatinine Ratio, Urine  5. Hyperlipidemia LDL goal <70 -continues on lipitor with dietary modifications - Complete Metabolic Panel with eGFR  6. Chronic deep vein thrombosis (DVT) of proximal vein of  left lower extremity (HCC) -stable on xarelto, no signs of recurrence.   7. Urine retention -followed by urology, will chronic foley catheter  8. Aortic atherosclerosis (HCC) -continues on xarelto and statin  9. Shortness of breath on exertion -followed by pulmonary, recent CT and sleep study completed.   10. Morbid obesity -has had some beneficial weight loss, education provided on healthy weight loss through increase in physical activity and proper nutrition   Return in about 5 months (around 11/29/2022) for routine follow up, labs at appt .  [name]. Biagio Borg [facility] [phone]

## 2022-06-30 ENCOUNTER — Telehealth: Payer: Self-pay | Admitting: Pulmonary Disease

## 2022-06-30 DIAGNOSIS — G4733 Obstructive sleep apnea (adult) (pediatric): Secondary | ICD-10-CM | POA: Diagnosis not present

## 2022-06-30 LAB — MICROALBUMIN / CREATININE URINE RATIO
Creatinine, Urine: 169 mg/dL (ref 20–320)
Microalb Creat Ratio: 682 mg/g creat — ABNORMAL HIGH (ref <30)
Microalb, Ur: 115.3 mg/dL

## 2022-06-30 NOTE — Telephone Encounter (Signed)
PSG 06/28/22 >> AHI 107, SpO2 84%, Sleep time 37 min.   Please let him know he had trouble sleeping during his sleep study related to respiratory events, but it looks like he has severe obstructive sleep apnea.  Please see if we can get a CPAP download from [employer] to determine if he needs adjustment to his CPAP settings.

## 2022-06-30 NOTE — Procedures (Signed)
     Patient Name: Thomas [name], Thomas [name] Date: 06/28/2022 Gender: Male D.O.B: 18-Nov-1950 Age (years): 72 Referring Provider: Coralyn Helling MD, ABSM Height (inches): 69 Interpreting Physician: Coralyn Helling MD, ABSM Weight (lbs): 330 RPSGT: Cherylann Parr BMI: 49 MRN: [id number] Neck Size: 20.00  CLINICAL INFORMATION Sleep Study Type: NPSG  Indication for sleep study: Fatigue, Obesity, Snoring, Witnesses Apnea / Gasping During Sleep  Epworth Sleepiness Score: 8  SLEEP STUDY TECHNIQUE As per the AASM Manual for the Scoring of Sleep and Associated Events v2.3 (April 2016) with a hypopnea requiring 4% desaturations.  The channels recorded and monitored were frontal, central and occipital EEG, electrooculogram (EOG), submentalis EMG (chin), nasal and oral airflow, thoracic and abdominal wall motion, anterior tibialis EMG, snore microphone, electrocardiogram, and pulse oximetry.  MEDICATIONS Medications self-administered by patient taken the night of the study : N/A  SLEEP ARCHITECTURE The study was initiated at 10:01:14 PM and ended at 4:01:49 AM.  Sleep onset time was 50.0 minutes and the sleep efficiency was 10.3%. The total sleep time was 37 minutes.  Stage REM latency was N/A minutes.  The patient spent 0.0% of the night in stage N1 sleep, 100.0% in stage N2 sleep, 0.0% in stage N3 and 0% in REM.  Alpha intrusion was absent.  Supine sleep was 0.00%.  RESPIRATORY PARAMETERS The overall apnea/hypopnea index (AHI) was 107.0 per hour. There were 62 total apneas, including 62 obstructive, 0 central and 0 mixed apneas. There were 4 hypopneas and 0 RERAs.  The AHI during Stage REM sleep was N/A per hour.  AHI while supine was N/A per hour.  The mean oxygen saturation was 89.1%. The minimum SpO2 during sleep was 84.0%.  loud snoring was noted during this study.  CARDIAC DATA The 2 lead EKG demonstrated sinus rhythm. The mean heart rate was 69.5 beats per minute. Other  EKG findings include: None.  LEG MOVEMENT DATA The total PLMS were 0 with a resulting PLMS index of 0.0. Associated arousal with leg movement index was 0.0 .  IMPRESSIONS - He had trouble falling asleep and staying asleep due to respiratory events.  As such he had reduced amount of sleep time during this study. - Severe obstructive sleep apnea occurred during this study (AHI = 107.0/h). - Mild oxygen desaturation was noted during this study (Min O2 = 84.0%). - The patient snored with loud snoring volume.  DIAGNOSIS - Obstructive Sleep Apnea (G47.33)  RECOMMENDATIONS - Therapeutic CPAP titration to determine optimal pressure required to alleviate sleep disordered breathing. - Avoid alcohol, sedatives and other CNS depressants that may worsen sleep apnea and disrupt normal sleep architecture. - Sleep hygiene should be reviewed to assess factors that may improve sleep quality. - Weight management and regular exercise should be initiated or continued if appropriate.  [Electronically signed] 06/30/2022 08:22 AM  Coralyn Helling MD, ABSM Diplomate, American Board of Sleep Medicine NPI: [id number]  [facility] PH: [phone]   FX: [phone] ACCREDITED BY THE AMERICAN ACADEMY OF SLEEP MEDICINE

## 2022-06-30 NOTE — Telephone Encounter (Signed)
ATC pt left detailed VM regarding his results, I did contact Brad ([employer]) and he is going to reach out to me regarding pt acct.

## 2022-07-01 ENCOUNTER — Telehealth: Payer: Self-pay | Admitting: Pharmacist

## 2022-07-01 ENCOUNTER — Other Ambulatory Visit (HOSPITAL_COMMUNITY): Payer: Self-pay

## 2022-07-01 DIAGNOSIS — I5022 Chronic systolic (congestive) heart failure: Secondary | ICD-10-CM

## 2022-07-01 DIAGNOSIS — E1122 Type 2 diabetes mellitus with diabetic chronic kidney disease: Secondary | ICD-10-CM

## 2022-07-01 MED ORDER — SPIRONOLACTONE 50 MG PO TABS
50.0000 mg | ORAL_TABLET | Freq: Every day | ORAL | 3 refills | Status: DC
  Filled 2022-07-01: qty 90, 90d supply, fill #0

## 2022-07-01 MED ORDER — MOUNJARO 5 MG/0.5ML ~~LOC~~ SOAJ
5.0000 mg | SUBCUTANEOUS | 0 refills | Status: DC
Start: 2022-07-01 — End: 2022-07-01

## 2022-07-01 MED ORDER — MOUNJARO 5 MG/0.5ML ~~LOC~~ SOAJ
5.0000 mg | SUBCUTANEOUS | 0 refills | Status: DC
Start: 2022-07-01 — End: 2022-07-06
  Filled 2022-07-06: qty 2, 28d supply, fill #0

## 2022-07-01 NOTE — Telephone Encounter (Signed)
Called pt, tolerating Mounjaro 5mg  well, no side effects. Has 1 dose left. Says [facility] can't get Mounjaro in stock but they spoke with [facility] who has 5mg  dose in stock, they aren't sure if they have higher dose in stock. He is down from 337 lbs baseline to 312 at his PCP the other day which is excellent.   BP looked great at PCP as well on dose increase of spironolactone. Was supposed to have A1c and CMET drawn but don't see results - asked pt about this who states lab tried to stick him 3x but couldn't get sample. He asks if he can have labs checked at our office. This is fine, I placed orders for him to have labs checked tomorrow.

## 2022-07-01 NOTE — Addendum Note (Signed)
Addended by: Mickeal Daws E on: 07/01/2022 12:04 PM   Modules accepted: Orders

## 2022-07-01 NOTE — Telephone Encounter (Signed)
Thank you :)

## 2022-07-01 NOTE — Telephone Encounter (Signed)
*  STAT* If patient is at the pharmacy, call can be transferred to refill team.   1. Which medications need to be refilled? (please list name of each medication and dose if known) Mounjaro 5mg    2. Which pharmacy/location (including street and city if local pharmacy) is medication to be sent to?   [facility] - [place], [place] - [address] Phone: [phone]  Fax: [phone]      3. Do they need a 30 day or 90 day supply? 90   Pt spouse states [facility] has this medication but limited supply and she asked if you could send this over today. Please advise.

## 2022-07-02 ENCOUNTER — Ambulatory Visit: Payer: Self-pay

## 2022-07-02 ENCOUNTER — Ambulatory Visit: Payer: Medicare HMO

## 2022-07-02 ENCOUNTER — Telehealth: Payer: Self-pay

## 2022-07-02 NOTE — Patient Outreach (Signed)
  Care Coordination   Initial Visit Note   07/02/2022 Name: Thomas [name] MRN: [id number] DOB: 1950-06-13  Thomas [name] is a 72 y.o. year old male who sees [name], [name], NP for primary care. I spoke with  [name] by phone today.  What matters to the patients health and wellness today?  Patient would like assistance with obtaining a new CPAP machine.     Goals Addressed             This Visit's Progress    To obtain a new CPAP machine       Care Coordination Interventions: Evaluation of current treatment plan related to Obstructive Sleep Apnea and patient's adherence to plan as established by provider Discussed and reviewed with patient recent sleep study results per [name] Determined patient is currently using his daughter's CPAP machine and needs to obtain a new CPAP Discussed patient previously used a Teacher, music via [place], per patient this DME was discontinued by the manufacturer  Discussed with patient this RN will contact [name] to request an order for a new CPAP through [facility] Determined patient continues to be on continuous Oxygen at 2 lts, he receives his Oxygen and supplies through [facility] Reviewed with patient, his next scheduled Pulmonary follow up with [name] scheduled for 08/10/22 @09 :00 AM Sent secure message to Morrie Sheldon CMA for [name] requesting assistance with care coordination, advised patient is requesting a Rx for new CPAP, preferably a ResMed 11 if [name] approves. Advised patient would like to use [facility] for his supplier. Advised patient is currently using his daughter's CPAP and would like to return it to her. Advised patient is on continuous Oxygen, 2 lts per nasal cannula. Advised patient is currently using humidity on his CPAP. Advised patient had previously used the [employer] [facility], however, patient was advised this DME has been discontinued by the manufacturer.  Requested [place] f/u to advise this message  was received and delivered to [name]   Received reply from [facility] CMA stating she has entered a telephone encounter routed to [name] with my message regarding patient's need for a new CPAP and supplies    Interventions Today    Flowsheet Row Most Recent Value  Chronic Disease   Chronic disease during today's visit Other, Diabetes  [OSA]  General Interventions   General Interventions Discussed/Reviewed General Interventions Discussed, General Interventions Reviewed, Labs, Doctor Visits, Durable Medical Equipment (DME), Communication with  Doctor Visits Discussed/Reviewed Doctor Visits Discussed, Doctor Visits Reviewed, PCP, Specialist  Durable Medical Equipment (DME) Other, Oxygen, Wheelchair  [CPAP]  Wheelchair Motorized  Communication with RN, PCP/Specialists  [[name], Morrie Sheldon, CMA]  Education Interventions   Education Provided Provided Education  Provided Verbal Education On Labs, When to see the doctor, Medication  Labs Reviewed Hgb A1c, Kidney Function  [Confirmed upcoming lab visit]  Pharmacy Interventions   Pharmacy Dicussed/Reviewed Pharmacy Topics Discussed, Pharmacy Topics Reviewed, Medications and their functions            SDOH assessments and interventions completed:  No     Care Coordination Interventions:  Yes, provided   Follow up plan: Follow up call scheduled for 08/12/22 @09 :30 AM    Encounter Outcome:  Pt. Visit Completed

## 2022-07-02 NOTE — Telephone Encounter (Signed)
Dr.Sood please see below secure chat from RN in regards to pts SNS results, are you okay with me placing the order for CPAP? If so what pressures would you prefer?

## 2022-07-02 NOTE — Telephone Encounter (Signed)
I can't read the secure chat message.  The font is too small.  Can you tell me what the secure chat message says.

## 2022-07-02 NOTE — Patient Instructions (Addendum)
Visit Information  Thank you for taking time to visit with me today. Please don't hesitate to contact me if I can be of assistance to you.   Following are the goals we discussed today:   Goals Addressed             This Visit's Progress    To obtain a new CPAP machine       Care Coordination Interventions: Evaluation of current treatment plan related to Obstructive Sleep Apnea and patient's adherence to plan as established by provider Discussed and reviewed with patient recent sleep study results per [name] Determined patient is currently using his daughter's CPAP machine and needs to obtain a new CPAP Discussed patient previously used a Teacher, music via [place], per patient this DME was discontinued by the manufacturer  Discussed with patient this RN will contact [name] to request an order for a new CPAP through [facility] Determined patient continues to be on continuous Oxygen at 2 lts, he receives his Oxygen and supplies through [facility] Reviewed with patient, his next scheduled Pulmonary follow up with [name] scheduled for 08/10/22 @09 :00 AM Sent secure message to Morrie Sheldon CMA for [name] requesting assistance with care coordination, advised patient is requesting a Rx for new CPAP, preferably a ResMed 11 if [name] approves. Advised patient would like to use [facility] for his supplier. Advised patient is currently using his daughter's CPAP and would like to return it to her. Advised patient is on continuous Oxygen, 2 lts per nasal cannula. Advised patient is currently using humidity on his CPAP. Advised patient had previously used the [employer], however, patient was advised this DME has been discontinued by the manufacturer.  Requested Graham f/u to advise this message was received and delivered to [name]   Received reply from [facility] CMA stating she has entered a telephone encounter routed to [name] with my message regarding patient's need for a new CPAP and  supplies           Our next appointment is by telephone on 08/12/22 at 09:30 AM  Please call the care guide team at [phone] if you need to cancel or reschedule your appointment.   If you are experiencing a Mental Health or Behavioral Health Crisis or need someone to talk to, please call 1-800-273-TALK (toll free, 24 hour hotline) go to [facility] [address] [place] [phone])  Patient verbalizes understanding of instructions and care plan provided today and agrees to view in MyChart. Active MyChart status and patient understanding of how to access instructions and care plan via MyChart confirmed with patient.     [name], RN, BSN, CCM Care Management Coordinator [facility] Care Management  Direct Phone: [phone]

## 2022-07-03 ENCOUNTER — Ambulatory Visit: Payer: Medicare HMO | Attending: Interventional Cardiology

## 2022-07-03 DIAGNOSIS — E1122 Type 2 diabetes mellitus with diabetic chronic kidney disease: Secondary | ICD-10-CM

## 2022-07-03 DIAGNOSIS — I5022 Chronic systolic (congestive) heart failure: Secondary | ICD-10-CM

## 2022-07-04 LAB — COMPREHENSIVE METABOLIC PANEL
ALT: 44 IU/L (ref 0–44)
AST: 26 IU/L (ref 0–40)
Albumin/Globulin Ratio: 1.3 (ref 1.2–2.2)
Albumin: 4.3 g/dL (ref 3.8–4.8)
Alkaline Phosphatase: 104 IU/L (ref 44–121)
BUN/Creatinine Ratio: 13 (ref 10–24)
BUN: 22 mg/dL (ref 8–27)
Bilirubin Total: 0.8 mg/dL (ref 0.0–1.2)
CO2: 23 mmol/L (ref 20–29)
Calcium: 9.7 mg/dL (ref 8.6–10.2)
Chloride: 101 mmol/L (ref 96–106)
Creatinine, Ser: 1.7 mg/dL — ABNORMAL HIGH (ref 0.76–1.27)
Globulin, Total: 3.3 g/dL (ref 1.5–4.5)
Glucose: 141 mg/dL — ABNORMAL HIGH (ref 70–99)
Potassium: 4.6 mmol/L (ref 3.5–5.2)
Sodium: 141 mmol/L (ref 134–144)
Total Protein: 7.6 g/dL (ref 6.0–8.5)
eGFR: 42 mL/min/{1.73_m2} — ABNORMAL LOW (ref >59)

## 2022-07-04 LAB — HEMOGLOBIN A1C
Est. average glucose Bld gHb Est-mCnc: 140 mg/dL
Hgb A1c MFr Bld: 6.5 % — ABNORMAL HIGH (ref 4.8–5.6)

## 2022-07-06 ENCOUNTER — Telehealth: Payer: Self-pay | Admitting: Pharmacist

## 2022-07-06 ENCOUNTER — Other Ambulatory Visit: Payer: Self-pay | Admitting: Interventional Cardiology

## 2022-07-06 ENCOUNTER — Other Ambulatory Visit (HOSPITAL_COMMUNITY): Payer: Self-pay

## 2022-07-06 ENCOUNTER — Other Ambulatory Visit (HOSPITAL_BASED_OUTPATIENT_CLINIC_OR_DEPARTMENT_OTHER): Payer: Self-pay

## 2022-07-06 DIAGNOSIS — E1122 Type 2 diabetes mellitus with diabetic chronic kidney disease: Secondary | ICD-10-CM

## 2022-07-06 MED ORDER — MOUNJARO 5 MG/0.5ML ~~LOC~~ SOAJ
5.0000 mg | SUBCUTANEOUS | 0 refills | Status: DC
Start: 2022-07-06 — End: 2022-07-21
  Filled 2022-07-06: qty 2, 28d supply, fill #0
  Filled 2022-07-21: qty 2, 28d supply, fill #1

## 2022-07-06 NOTE — Telephone Encounter (Signed)
*  STAT* If patient is at the pharmacy, call can be transferred to refill team.   1. Which medications need to be refilled? (please list name of each medication and dose if known) Mounjaro 5mg    2. Which pharmacy/location (including street and city if local pharmacy) is medication to be sent to? [facility] [place]  3. Do they need a 30 day or 90 day supply? 90 day  Patient's wife states they were suppose to send [place] a request for this.  She called around and this was the only place that had it. She states patient just took his last dose today.

## 2022-07-07 ENCOUNTER — Telehealth: Payer: Self-pay | Admitting: Pharmacist

## 2022-07-07 DIAGNOSIS — I5042 Chronic combined systolic (congestive) and diastolic (congestive) heart failure: Secondary | ICD-10-CM

## 2022-07-07 NOTE — Telephone Encounter (Signed)
I spoke with patient and his wife about labs. Scr went from 1.13 to 1.7 after increasing spironolactone from 25 to 50mg  daily. Scr was stable on 25mg . He denies any vomitting/diarrhea. No NSAIDs. No extra doses of furosemide. He took spironolactone and furosemide this AM. I have asked him to hold spironolactone Wed and Thurs. Friday resume at 25mg  daily. Decrease furosemide to 20mg  daily. He denies any swelling. He will come Tuesday for repeat labs.  BP has improved. I asked patient to continue to monitor.  In regards to [employer], patient is ok with going up to 7.5mg  but was not sure if pharmacy had. I advised that if pharmacy has 7.5mg , please let us know and we can send Rx there. Pharmacy has Rx for 5mg .

## 2022-07-14 ENCOUNTER — Ambulatory Visit: Payer: Medicare HMO | Attending: Cardiology

## 2022-07-14 DIAGNOSIS — I5042 Chronic combined systolic (congestive) and diastolic (congestive) heart failure: Secondary | ICD-10-CM | POA: Diagnosis not present

## 2022-07-15 ENCOUNTER — Telehealth: Payer: Self-pay | Admitting: Pharmacist

## 2022-07-15 ENCOUNTER — Ambulatory Visit: Payer: Medicare HMO

## 2022-07-15 DIAGNOSIS — R339 Retention of urine, unspecified: Secondary | ICD-10-CM | POA: Diagnosis not present

## 2022-07-15 LAB — BASIC METABOLIC PANEL
BUN/Creatinine Ratio: 18 (ref 10–24)
BUN: 24 mg/dL (ref 8–27)
CO2: 20 mmol/L (ref 20–29)
Calcium: 8.9 mg/dL (ref 8.6–10.2)
Chloride: 103 mmol/L (ref 96–106)
Creatinine, Ser: 1.34 mg/dL — ABNORMAL HIGH (ref 0.76–1.27)
Glucose: 124 mg/dL — ABNORMAL HIGH (ref 70–99)
Potassium: 3.9 mmol/L (ref 3.5–5.2)
Sodium: 140 mmol/L (ref 134–144)
eGFR: 56 mL/min/{1.73_m2} — ABNORMAL LOW (ref >59)

## 2022-07-15 NOTE — Telephone Encounter (Signed)
Scr improved. Patient states that he held spironolactone for 2 days and then resumed at 50mg  not 25mg  (thought he was supposed to go back to 50mg ). Taking 1/2 of furosemide (20mg ). BP at MD was 127/ "80 something"  Continue spironolactone 50mg  daily and furosemdie 20mg  daily. Advised that he can try taking furosemide 20mg  as needed only.  He would like to increase to [facility] 7.5mg . Just took first dose of 5mg . I advised he call us closer to when its time to increase and let us know where he would like the Rx.

## 2022-07-17 ENCOUNTER — Other Ambulatory Visit: Payer: Self-pay | Admitting: Pulmonary Disease

## 2022-07-17 DIAGNOSIS — G4733 Obstructive sleep apnea (adult) (pediatric): Secondary | ICD-10-CM

## 2022-07-17 NOTE — Telephone Encounter (Signed)
Provided Dr.Sood w/ print out of secure chat. NFN

## 2022-07-17 NOTE — Progress Notes (Signed)
He was using a loaner CPAP from a family member.  Was a respironics device that is no longer supported.  Will arrange for new Resmed 11 auto CPAP through [facility].

## 2022-07-20 MED ORDER — FUROSEMIDE 20 MG PO TABS: 20.0000 mg | ORAL_TABLET | ORAL | 1 refills | Status: DC | PRN

## 2022-07-20 NOTE — Telephone Encounter (Signed)
I have updated med list reflecting furosemide 20mg  prn.

## 2022-07-21 ENCOUNTER — Telehealth: Payer: Self-pay | Admitting: Pharmacist

## 2022-07-21 ENCOUNTER — Other Ambulatory Visit (HOSPITAL_COMMUNITY): Payer: Self-pay

## 2022-07-21 MED ORDER — MOUNJARO 7.5 MG/0.5ML ~~LOC~~ SOAJ
7.5000 mg | SUBCUTANEOUS | 0 refills | Status: DC
  Filled 2022-07-21: qty 2, 28d supply, fill #0

## 2022-07-21 NOTE — Telephone Encounter (Signed)
  Pt c/o medication issue:  1. Name of Medication: Mounjaro 7.5mg    2. How are you currently taking this medication (dosage and times per day)?   3. Are you having a reaction (difficulty breathing--STAT)? No   4. What is your medication issue? Pt's wife calling, she said to let Melissa know to send pt's Mounjaro 7.5mg   to  [facility]  Because they might ran out of this medication. She requested if [name] Kaufmann can call her to confirm that it has been sent out

## 2022-07-21 NOTE — Telephone Encounter (Signed)
7.5mg  dose sent to pharmacy, pt aware.

## 2022-07-27 ENCOUNTER — Telehealth: Payer: Self-pay | Admitting: Pulmonary Disease

## 2022-07-27 DIAGNOSIS — G4733 Obstructive sleep apnea (adult) (pediatric): Secondary | ICD-10-CM

## 2022-07-27 NOTE — Telephone Encounter (Signed)
Please call Husband @ [phone]   Wife says [facility] called and they only have a RX for the machine but no supplies. Ie mask, hose, tank, etc.  Please call PT to advise action taken. TY.

## 2022-07-30 ENCOUNTER — Emergency Department (HOSPITAL_COMMUNITY)
Admission: EM | Admit: 2022-07-30 | Discharge: 2022-07-30 | Disposition: A | Discharge status: Home / Self Care | Payer: Medicare HMO | Attending: Emergency Medicine | Admitting: Emergency Medicine

## 2022-07-30 ENCOUNTER — Other Ambulatory Visit: Payer: Self-pay

## 2022-07-30 ENCOUNTER — Encounter (HOSPITAL_COMMUNITY): Payer: Self-pay

## 2022-07-30 DIAGNOSIS — T83198A Other mechanical complication of other urinary devices and implants, initial encounter: Secondary | ICD-10-CM | POA: Diagnosis not present

## 2022-07-30 DIAGNOSIS — Z7984 Long term (current) use of oral hypoglycemic drugs: Secondary | ICD-10-CM | POA: Insufficient documentation

## 2022-07-30 DIAGNOSIS — Y732 Prosthetic and other implants, materials and accessory gastroenterology and urology devices associated with adverse incidents: Secondary | ICD-10-CM | POA: Diagnosis not present

## 2022-07-30 DIAGNOSIS — E119 Type 2 diabetes mellitus without complications: Secondary | ICD-10-CM | POA: Insufficient documentation

## 2022-07-30 DIAGNOSIS — T83010A Breakdown (mechanical) of cystostomy catheter, initial encounter: Secondary | ICD-10-CM

## 2022-07-30 DIAGNOSIS — I10 Essential (primary) hypertension: Secondary | ICD-10-CM | POA: Diagnosis not present

## 2022-07-30 DIAGNOSIS — Z794 Long term (current) use of insulin: Secondary | ICD-10-CM | POA: Insufficient documentation

## 2022-07-30 DIAGNOSIS — Z7982 Long term (current) use of aspirin: Secondary | ICD-10-CM | POA: Diagnosis not present

## 2022-07-30 DIAGNOSIS — Z79899 Other long term (current) drug therapy: Secondary | ICD-10-CM | POA: Diagnosis not present

## 2022-07-30 DIAGNOSIS — T83098A Other mechanical complication of other indwelling urethral catheter, initial encounter: Secondary | ICD-10-CM | POA: Insufficient documentation

## 2022-07-30 DIAGNOSIS — R339 Retention of urine, unspecified: Secondary | ICD-10-CM | POA: Diagnosis present

## 2022-07-30 MED ORDER — TRAMADOL HCL 50 MG PO TABS
50.0000 mg | ORAL_TABLET | Freq: Once | ORAL | Status: AC
  Administered 2022-07-30: 50 mg via ORAL
  Filled 2022-07-30: qty 1

## 2022-07-30 NOTE — ED Notes (Signed)
Attempted to bladder scan with NT. There was difficulties finding bladder with bladder scanner. Additional staff notified and request to use [place] to find bladder and scan volume.

## 2022-07-30 NOTE — ED Triage Notes (Signed)
Pt states the last time urine came into foley catheter was at 1100 today. Pt c/o abd pain

## 2022-07-30 NOTE — Discharge Instructions (Signed)
Please follow-up with your Urologist

## 2022-07-30 NOTE — ED Provider Notes (Signed)
[place] EMERGENCY DEPARTMENT AT [facility] Provider Note   CSN: [id number] Arrival date & time: 07/30/22  1624     History  Chief Complaint  Patient presents with   anuria    TREMEL SETTERS is a 72 y.o. male.  72 y.o male with a PMH of DM, HTN, Suprapubic catheter presents to the ED with a chief complaint of urinary retention. Patient states he had his suprapubic catheter placed at [facility] on 07/08/2022, he states after changing three hours ago, he has not had any output from the bag. He is complaining of pain along the lower abdomen, feels like he is retaining urine at this time. He was bladder scan by staff with 100 cc of urine present. He reports when he changed the bag earlier he had about 1000 cc on his bag. No fever, no leg swelling, no shortness of breath.   The history is provided by the patient and medical records.       Home Medications Prior to Admission medications   Medication Sig Start Date End Date Taking? Authorizing Provider  Accu-Chek Softclix Lancets lancets Use to test blood sugar daily 06/10/22   Sharon Seller, NP  acetaminophen (TYLENOL) 500 MG tablet Take by mouth. 09/28/19   [provider]  Alcohol Swabs (B-D SINGLE USE SWABS REGULAR) PADS Use in testing blood sugar daily. Dx: E11.22 05/20/21   Sharon Seller, NP  amLODipine (NORVASC) 10 MG tablet TAKE 1 TABLET EVERY DAY FOR HIGH BLOOD PRESSURE 05/11/22   Sharon Seller, NP  Ascorbic Acid (VITAMIN C) 1000 MG tablet Take 2 tablets (2,000 mg total) by mouth daily. 01/19/22     aspirin EC 81 MG tablet Take 81 mg by mouth daily.    [provider]  atorvastatin (LIPITOR) 40 MG tablet TAKE 1 TABLET EVERY DAY 02/25/22   Sharon Seller, NP  Blood Glucose Calibration (ACCU-CHEK AVIVA) SOLN Use once daily as directed dx E11.22 08/25/19   Sharon Seller, NP  Blood Glucose Monitoring Suppl (TRUE METRIX AIR GLUCOSE METER) w/Device KIT 1 Device by Does not  apply route daily as needed. E11.22 05/20/21   Sharon Seller, NP  carvedilol (COREG) 12.5 MG tablet TAKE 1 TABLET TWICE DAILY WITH MEALS 04/27/22   Sharon Seller, NP  Cholecalciferol (VITAMIN D3) 50 MCG (2000 UT) TABS Take 1 tablet (2,000 Units) by mouth daily. 01/19/22     empagliflozin (JARDIANCE) 10 MG TABS tablet Take 1 tablet (10 mg total) by mouth daily. 08/18/21   Sharon Seller, NP  furosemide (LASIX) 20 MG tablet Take 1 tablet (20 mg total) by mouth as needed. 07/20/22 10/18/22  Corky Crafts, MD  glucose blood (ACCU-CHEK AVIVA PLUS) test strip Use to test blood sugar daily. 03/24/22   Sharon Seller, NP  hydrALAZINE (APRESOLINE) 25 MG tablet TAKE 3 TABLETS THREE TIMES DAILY 02/10/22   Ngetich, Dinah C, NP  Lancets Misc. (ACCU-CHEK SOFTCLIX LANCET DEV) KIT Use to test blood sugar daily 03/18/21   Sharon Seller, NP  losartan (COZAAR) 50 MG tablet TAKE 1 TABLET (50 MG TOTAL) BY MOUTH DAILY. 10/21/21   Sharon Seller, NP  metFORMIN (GLUCOPHAGE) 500 MG tablet TAKE 1 TABLET TWICE DAILY WITH MEALS 04/27/22   Sharon Seller, NP  methocarbamol (ROBAXIN) 500 MG tablet Take 1 tablet (500 mg total) by mouth every 8 (eight) hours as needed for muscle spasms. 09/29/21   Sharon Seller, NP  Misc  Natural Products (SUPER GREENS PO) Take 1 tablet by mouth 2 (two) times daily. Brand: Goli, gummy    [provider]  potassium chloride SA (KLOR-CON M) 20 MEQ tablet TAKE 1 TABLET TWICE DAILY 10/21/21   Sharon Seller, NP  rivaroxaban (XARELTO) 20 MG TABS tablet Take 1 tablet (20 mg total) by mouth daily with supper. 08/18/21   Sharon Seller, NP  Senna 8.7 MG CHEW Chew 1 tablet by mouth as needed. 05/20/21   Sharon Seller, NP  senna-docusate (SENOKOT-S) 8.6-50 MG tablet Take 1 tablet by mouth daily as needed. 11/19/16   [provider]  spironolactone (ALDACTONE) 50 MG tablet Take 50 mg by mouth daily. 07/07/22   Corky Crafts, MD  tirzepatide [facility])  7.5 MG/0.5ML Pen Inject 7.5 mg into the skin once a week. 07/21/22   Corky Crafts, MD  traMADol (ULTRAM) 50 MG tablet Take 1 tablet (50 mg total) by mouth every 6 (six) hours as needed for pain 06/29/22   Sharon Seller, NP  TRUEplus Lancets 28G MISC 1 Device by Other route daily as needed. DX:E11.22 10/21/21   Sharon Seller, NP  zinc gluconate 50 MG tablet Take 1 tablet (50 mg total) by mouth daily. 11/18/21   Ngetich, Dinah C, NP      Allergies    Lisinopril    Review of Systems   Review of Systems  Constitutional:  Negative for fever.  Genitourinary:  Positive for difficulty urinating. Negative for dysuria and flank pain.    Physical Exam Updated Vital Signs BP (!) 155/81   Pulse 95   Temp 99.3 F (37.4 C) (Oral)   Resp 14   Ht 5\' 9"  (1.753 m)   Wt (!) 141.5 kg   SpO2 92%   BMI 46.07 kg/m  Physical Exam Vitals and nursing note reviewed.  Constitutional:      Appearance: He is obese.     Comments: Appears very uncomfortable.  HENT:     Head: Normocephalic and atraumatic.  Eyes:     Pupils: Pupils are equal, round, and reactive to light.  Cardiovascular:     Rate and Rhythm: Normal rate.  Pulmonary:     Effort: Pulmonary effort is normal.     Breath sounds: No wheezing or rales.  Abdominal:     General: Abdomen is flat.     Tenderness: There is no right CVA tenderness or left CVA tenderness.  Musculoskeletal:     Cervical back: Normal range of motion and neck supple.     Right lower leg: No edema.     Left lower leg: No edema.  Skin:    General: Skin is warm and dry.  Neurological:     Mental Status: He is alert and oriented to person, place, and time.     ED Results / Procedures / Treatments   Labs (all labs ordered are listed, but only abnormal results are displayed) Labs Reviewed - No data to display  EKG None  Radiology No results found.  Procedures Procedures    Medications Ordered in ED Medications  traMADol (ULTRAM) tablet 50  mg (50 mg Oral Given 07/30/22 1845)    ED Course/ Medical Decision Making/ A&P                             Medical Decision Making Risk Prescription drug management.   Patient presents to the ED with a chief complaint  of suprapubic catheter pain, reports he feels like he is having severe urinary retention, as he has not voided since this morning.  He last changed his catheter bag around 3 PM today, prior to that he had emptied a bag around 11 AM which had approximately 1000 cc of urine.  He appears in a lot of discomfort upon my interview today.  He is not having any lower abdominal pain, he is not having any fevers, no other complaints reported.   Patient's bag was last changed on Jul 08, 2022, this was assessed by my attending Dr. Anitra Lauth and myself at the bedside, unable to unclog the catheter.  He is followed by [facility] urology.  We discussed changing his urinary catheter, he had approximately 600 cc of urine output upon changing this catheter, he immediately had improvement in his symptoms immediately after the new placement.  7:54 PM patient reassessed by me with resolution in his symptoms, he appears hemodynamically stable at this time for discharge.  Return precautions discussed at length.    Portions of this note were generated with Scientist, clinical (histocompatibility and immunogenetics). Dictation errors may occur despite best attempts at proofreading.   Final Clinical Impression(s) / ED Diagnoses Final diagnoses:  Suprapubic catheter dysfunction, initial encounter [facility])    Rx / DC Orders ED Discharge Orders     None         Claude Manges, Cordelia Poche 07/30/22 Camillia Herter, MD 07/30/22 2336

## 2022-07-31 NOTE — Telephone Encounter (Signed)
Resending to both Triage pools.. No action take in 4 days.

## 2022-08-03 ENCOUNTER — Other Ambulatory Visit (HOSPITAL_COMMUNITY): Payer: Self-pay

## 2022-08-03 ENCOUNTER — Other Ambulatory Visit: Payer: Self-pay | Admitting: Interventional Cardiology

## 2022-08-04 ENCOUNTER — Other Ambulatory Visit: Payer: Self-pay

## 2022-08-04 ENCOUNTER — Telehealth: Payer: Self-pay

## 2022-08-04 ENCOUNTER — Other Ambulatory Visit (HOSPITAL_COMMUNITY): Payer: Self-pay

## 2022-08-04 MED ORDER — MOUNJARO 7.5 MG/0.5ML ~~LOC~~ SOAJ
7.5000 mg | SUBCUTANEOUS | 0 refills | Status: DC
  Filled 2022-08-04 – 2022-08-10 (×2): qty 2, 28d supply, fill #0

## 2022-08-04 NOTE — Telephone Encounter (Signed)
Transition Care Management Follow-up Telephone Call Date of discharge and from where: [facility] 6/18 How have you been since you were released from the hospital? Fine but disappointed in the care he received in the ED  Any questions or concerns? Yes  Items Reviewed: Did the pt receive and understand the discharge instructions provided? Yes  Medications obtained and verified? Yes  Other? No  Any new allergies since your discharge? No  Dietary orders reviewed? No Do you have support at home? Yes     Follow up appointments reviewed:  PCP Hospital f/u appt confirmed? No  Scheduled to see  on  @ . Specialist Hospital f/u appt confirmed? Yes  Scheduled to see  Urology [facility]  6/19          Are transportation arrangements needed? No  If their condition worsens, is the pt aware to call PCP or go to the Emergency Dept.? Yes Was the patient provided with contact information for the PCP's office or ED? Yes Was to pt encouraged to call back with questions or concerns? Yes

## 2022-08-04 NOTE — Telephone Encounter (Signed)
I called and spoke with the pt  He states that his new CPAP mask does not work well for him  He wants the mirage quattro but was advised by [employer] they no longer have those  I have put in an order for the pt to get fitted for new mask  Nothing further needed

## 2022-08-10 ENCOUNTER — Other Ambulatory Visit (HOSPITAL_COMMUNITY): Payer: Self-pay

## 2022-08-10 ENCOUNTER — Encounter (HOSPITAL_BASED_OUTPATIENT_CLINIC_OR_DEPARTMENT_OTHER): Payer: Self-pay | Admitting: Pulmonary Disease

## 2022-08-10 ENCOUNTER — Ambulatory Visit (HOSPITAL_BASED_OUTPATIENT_CLINIC_OR_DEPARTMENT_OTHER): Payer: Medicare HMO | Admitting: Pulmonary Disease

## 2022-08-10 VITALS — BP 118/76 | HR 72 | Ht 69.0 in

## 2022-08-10 DIAGNOSIS — G4733 Obstructive sleep apnea (adult) (pediatric): Secondary | ICD-10-CM | POA: Diagnosis not present

## 2022-08-10 DIAGNOSIS — G4734 Idiopathic sleep related nonobstructive alveolar hypoventilation: Secondary | ICD-10-CM | POA: Diagnosis not present

## 2022-08-10 NOTE — Progress Notes (Signed)
[place] [facility], Critical Care, and Sleep Medicine  Chief Complaint  Patient presents with   Follow-up    2 mo f/u for CT scan results. States he has been doing well since last visit. Received his cpap machine a few days ago.     Past Surgical History:  He  has a past surgical history that includes Knee surgery; Shoulder surgery; Hernia repair; Prostate surgery; uretha surgery-2014; Radioactive seed implant; Colonoscopy; Multiple tooth extractions; Total knee arthroplasty (Right, 11/19/2016); Cardioversion (N/A, 06/03/2017); RIGHT/LEFT HEART CATH AND CORONARY ANGIOGRAPHY (N/A, 07/06/2017); Colonoscopy with propofol (N/A, 10/20/2018); polypectomy (10/20/2018); and Cataract extraction (Left, 07/31/2021).  Past Medical History:  Diastolic CHF, Arthritis, Prostate cancer, CKD, Neuropathy, DVT, GERD, HTN, PNA, Suprapubic catheter  Constitutional:  BP 118/76   Pulse 72   Ht 5\' 9"  (1.753 m)   SpO2 94% Comment: on RA  BMI 46.07 kg/m   Brief Summary:  Thomas [name] is a 72 y.o. male with obstructive sleep apnea, chronic respiratory failure and CTEPH.      Subjective:   He is here with his wife and daughter.  CT chest from April showed improved LAN, and stable Lt lower lobe scarring.  Not having cough, wheeze, sputum, or leg swelling.  Starting to exercise more.  Uses oxygen intermittently during the day.  He has a pulse oximeter.  His sleep study showed very severe sleep apnea.  He didn't meet split night criteria.  Uses CPAP nightly.  No issues with mask fit.  He isn't sure if he should be using oxygen at night with CPAP still.  His PFT hasn't been scheduled yet.   Physical Exam:   Appearance - well kempt   ENMT - no sinus tenderness, no oral exudate, no LAN, Mallampati 3 airway, no stridor  Respiratory - equal breath sounds bilaterally, no wheezing or rales  CV - s1s2 regular rate and rhythm, no murmurs  Ext - no clubbing, no edema  Skin - no  rashes  Psych - normal mood and affect   Pulmonary testing:  Spirometry 10/19/17 >> FEV1 2.5 (81%), FEV1% 91  Chest Imaging:  CT angio chest 11/23/15 >> PE Rt side CT angio chest 03/03/16 >> chronic PE on Rt CT angio chest 04/02/17 >> atherosclerosis CT angio chest 03/23/22 >> borderline LAN, no PE CT chest 06/15/22 >> LLL scarring, similar to smaller mediastinal LAN, fatty liver, Lt adrenal adenoma  Sleep Tests:  HST 09/08/17 >> AHI 77.9, SaO2 low 70% Bipap titration 09/18/17 >> Bipap 19/13 cm H2O >> AHI 8.5, didn't need supplemental oxygen. Bipap 11/14/17 to 12/13/17 >> used on 30 of 30 nights with average 8 hrs 25 min.  Average AHI 2.3 with Bipap 19/13 cm H2O PSG 06/28/22 >> AHI 107, SpO2 84%, Sleep time 37 min.  Auto CPAP 07/31/22 to 08/09/22 >> used on 9 of 9 nights with average 7 hrs 22 min.  Average AHI 3.4 with median CPAP 8 and 95 th percentile CPAP 11 cm H2O  Cardiac Tests:  Echo 06/02/17 >> EF 45 to 50%, PAS 35 mmHg RHC/LHC 07/06/17 >> non obstructive CAD, mod/severe pulmonary HTN Echo 05/13/22 >> EF 45 to 50%, mod LVH, grade 1 DD, mod elevation in PASP, mild/mod LA dilation, mod RA dilation  Social History:  He  reports that he has never smoked. He has never used smokeless tobacco. He reports that he does not drink alcohol and does not use drugs.  Family History:  His family history includes Breast cancer (age of onset:  30) in his sister; Breast cancer (age of onset: 20) in his maternal aunt; Breast cancer (age of onset: 69) in his mother; Cancer (age of onset: 83) in his sister; Cancer (age of onset: 28) in his maternal uncle; Cervical cancer (age of onset: 47) in an other family member; Leukemia (age of onset: 60) in his brother; Lung cancer in his maternal uncle; Other (age of onset: 7) in his brother; Prostate cancer in his brother and paternal uncle.     Assessment/Plan:   Dyspnea on exertion. - likely combination of combined CHF, CTEPH and obesity with deconditioning - he  is concerned possible exposure from his Respironics CPAP machine could be contributing to his symptoms; explained this would be difficult to determine  - will make sure his PFT gets scheduled  Mediastinal adenopathy. - CT chest from April 2024 showed improvement - no additional radiographic follow up needed unless he develops new symptoms  Obstructive sleep apnea. - he reports benefit from CPAP and compliance with therapy - will see if he can get set up with [facility] for his DME - current CPAP received in June 2024 - continue auto CPAP 5 to 20 cm H2O  Chronic respiratory failure with hypoxia. - in setting of obesity and CTEPH - goal SpO2 > 90% - using 2 liters with exertion - will arrange for ONO with CPAP and see if he needs to resume supplemental oxygen at night with CPAP  Obesity. - he is on mounjaro for his diabetes   Chronic combined CHF. - followed by Dr. Everette Rank with cardiology  CTEPH. - continue xarelto  Prostate cancer s/p suprapubic catheter. - followed by urology with [facility]  Time Spent Involved in Patient Care on Day of Examination:  38 minutes  Follow up:   Patient Instructions  Will arrange for overnight oxygen test with CPAP and make sure your breathing test gets scheduled  Follow up in 3 months  Medication List:   Allergies as of 08/10/2022       Reactions   Lisinopril Cough, Other (See Comments)   Muscle pain        Medication List        Accurate as of August 10, 2022  9:28 AM. If you have any questions, ask your nurse or doctor.          Accu-Chek Aviva Plus test strip Generic drug: glucose blood Use to test blood sugar daily.   Accu-Chek Aviva Soln Use once daily as directed dx E11.22   Accu-Chek Softclix Lancet Dev Kit Use to test blood sugar daily   acetaminophen 500 MG tablet Commonly known as: TYLENOL Take by mouth.   Aldactone 50 MG tablet Generic drug: spironolactone Take 50 mg by mouth daily.   amLODipine  10 MG tablet Commonly known as: NORVASC TAKE 1 TABLET EVERY DAY FOR HIGH BLOOD PRESSURE   aspirin EC 81 MG tablet Take 81 mg by mouth daily.   atorvastatin 40 MG tablet Commonly known as: LIPITOR TAKE 1 TABLET EVERY DAY   B-D SINGLE USE SWABS REGULAR Pads Use in testing blood sugar daily. Dx: E11.22   carvedilol 12.5 MG tablet Commonly known as: COREG TAKE 1 TABLET TWICE DAILY WITH MEALS   empagliflozin 10 MG Tabs tablet Commonly known as: JARDIANCE Take 1 tablet (10 mg total) by mouth daily.   furosemide 20 MG tablet Commonly known as: LASIX Take 1 tablet (20 mg total) by mouth as needed.   hydrALAZINE 25 MG tablet Commonly known  as: APRESOLINE TAKE 3 TABLETS THREE TIMES DAILY   losartan 50 MG tablet Commonly known as: COZAAR TAKE 1 TABLET (50 MG TOTAL) BY MOUTH DAILY.   metFORMIN 500 MG tablet Commonly known as: GLUCOPHAGE TAKE 1 TABLET TWICE DAILY WITH MEALS   methocarbamol 500 MG tablet Commonly known as: ROBAXIN Take 1 tablet (500 mg total) by mouth every 8 (eight) hours as needed for muscle spasms.   Mounjaro 7.5 MG/0.5ML Pen Generic drug: tirzepatide Inject 7.5 mg into the skin once a week.   potassium chloride SA 20 MEQ tablet Commonly known as: KLOR-CON M TAKE 1 TABLET TWICE DAILY   rivaroxaban 20 MG Tabs tablet Commonly known as: XARELTO Take 1 tablet (20 mg total) by mouth daily with supper.   Senna 8.7 MG Chew Chew 1 tablet by mouth as needed.   senna-docusate 8.6-50 MG tablet Commonly known as: Senokot-S Take 1 tablet by mouth daily as needed.   SUPER GREENS PO Take 1 tablet by mouth 2 (two) times daily. Brand: Goli, gummy   traMADol 50 MG tablet Commonly known as: ULTRAM Take 1 tablet (50 mg total) by mouth every 6 (six) hours as needed for pain   True Metrix Air Glucose Meter w/Device Kit 1 Device by Does not apply route daily as needed. E11.22   TRUEplus Lancets 28G Misc 1 Device by Other route daily as needed. DX:E11.22    Accu-Chek Softclix Lancets lancets Use to test blood sugar daily   vitamin C 1000 MG tablet Take 2 tablets (2,000 mg total) by mouth daily.   Vitamin D3 50 MCG (2000 UT) Tabs Take 1 tablet (2,000 Units) by mouth daily.   zinc gluconate 50 MG tablet Take 1 tablet (50 mg total) by mouth daily.        Signature:  Coralyn Helling, MD [facility] Pulmonary/Critical Care Pager - [phone] 08/10/2022, 9:28 AM

## 2022-08-10 NOTE — Patient Instructions (Signed)
Will arrange for overnight oxygen test with CPAP and make sure your breathing test gets scheduled  Follow up in 3 months

## 2022-08-11 ENCOUNTER — Other Ambulatory Visit (HOSPITAL_COMMUNITY): Payer: Self-pay

## 2022-08-12 ENCOUNTER — Telehealth: Payer: Self-pay | Admitting: Pharmacist

## 2022-08-12 ENCOUNTER — Other Ambulatory Visit (HOSPITAL_COMMUNITY): Payer: Self-pay

## 2022-08-12 ENCOUNTER — Ambulatory Visit: Payer: Self-pay

## 2022-08-12 DIAGNOSIS — I10 Essential (primary) hypertension: Secondary | ICD-10-CM

## 2022-08-12 DIAGNOSIS — E1169 Type 2 diabetes mellitus with other specified complication: Secondary | ICD-10-CM

## 2022-08-12 DIAGNOSIS — G4733 Obstructive sleep apnea (adult) (pediatric): Secondary | ICD-10-CM

## 2022-08-12 MED ORDER — MOUNJARO 10 MG/0.5ML ~~LOC~~ SOAJ
10.0000 mg | SUBCUTANEOUS | 0 refills | Status: DC
  Filled 2022-08-12: qty 2, 28d supply, fill #0

## 2022-08-12 NOTE — Patient Instructions (Signed)
Visit Information  Thank you for taking time to visit with me today. Please don't hesitate to contact me if I can be of assistance to you.   Following are the goals we discussed today:   Goals Addressed             This Visit's Progress    To have no further issues with Suprapubic Catheter       Care Coordination Interventions: Evaluation of current treatment plan related to impaired urinary elimination  and patient's adherence to plan as established by provider Reviewed and discussed with patient a recent ED visit for impaired elimination secondary to having a kinked suprapubic catheter Discussed with patient, his replacement catheter is working without difficulty and he voices having no c/o today Determined patient completed a Urology visit post ED visit Instructed patient to call his Urologist promptly to report new symptoms or concerns and patient verbalizes understanding          To obtain a new CPAP machine       Care Coordination Interventions: Evaluation of current treatment plan related to Obstructive Sleep Apnea and patient's adherence to plan as established by provider Reviewed and discussed with patient he received his new CPAP machine Determined patient completed a follow up visit with his Pulmonologist, [name] Review of patient status, including review of consultant's reports, relevant laboratory and other test results, and medications completed Noted [name] placed a referral for overnight oximetry per CPAP, educated patient on the referral process and next steps Reviewed and discussed with patient, [name] recommended PFT and patient states he was advised he would receive a call to have this testing scheduled  Scheduled next RN CC follow up with patient for 2 weeks out Assessed for SDOH barriers, patient would like resources for installation of a W/C ramp        Our next appointment is by telephone on 08/26/22 at 11:30 AM  Please call the care guide team at  [phone] if you need to cancel or reschedule your appointment.   If you are experiencing a Mental Health or Behavioral Health Crisis or need someone to talk to, please call 1-800-273-TALK (toll free, 24 hour hotline)  Patient verbalizes understanding of instructions and care plan provided today and agrees to view in MyChart. Active MyChart status and patient understanding of how to access instructions and care plan via MyChart confirmed with patient.     [name], RN, BSN, CCM Care Management Coordinator [facility] Care Management  Direct Phone: [phone]

## 2022-08-12 NOTE — Telephone Encounter (Signed)
Called to follow up with Mounjaro dose titration. Pt reports tolerating 7.5mg  dose well, no side effects. Reports pharmacy has 7.5mg  dose ready for pick up for him. Will send in 10mg  dose and have pharmacy fill that if they have it in stock. Pt aware if they do not to go ahead and pick up 7.5mg  refill.

## 2022-08-12 NOTE — Patient Outreach (Signed)
  Care Coordination   Follow Up Visit Note   08/12/2022 Name: Thomas [name] MRN: [id number] DOB: 1950/12/30  Thomas [name] is a 72 y.o. year old male who sees [name], [name], NP for primary care. I spoke with  Thomas [name] by phone today.  What matters to the patients health and wellness today?  Patient would like to have a W/C ramp installed at his home.     Goals Addressed             This Visit's Progress    To have no further issues with Suprapubic Catheter       Care Coordination Interventions: Evaluation of current treatment plan related to impaired urinary elimination  and patient's adherence to plan as established by provider Reviewed and discussed with patient a recent ED visit for impaired elimination secondary to having a kinked suprapubic catheter Discussed with patient, his replacement catheter is working without difficulty and he voices having no c/o today Determined patient completed a Urology visit post ED visit Instructed patient to call his Urologist promptly to report new symptoms or concerns and patient verbalizes understanding          To obtain a new CPAP machine       Care Coordination Interventions: Evaluation of current treatment plan related to Obstructive Sleep Apnea and patient's adherence to plan as established by provider Reviewed and discussed with patient he received his new CPAP machine Determined patient completed a follow up visit with his Pulmonologist, Thomas [name] Review of patient status, including review of consultant's reports, relevant laboratory and other test results, and medications completed Noted Thomas [name] placed a referral for overnight oximetry per CPAP, educated patient on the referral process and next steps Reviewed and discussed with patient, Thomas [name] recommended PFT and patient states he was advised he would receive a call to have this testing scheduled  Scheduled next RN CC follow up with patient for 2 weeks  out Assessed for SDOH barriers, patient would like resources for installation of a W/C ramp    Interventions Today    Flowsheet Row Most Recent Value  Chronic Disease   Chronic disease during today's visit Other, Diabetes  [OSA w/CPAP,  suprapubic catheter]  General Interventions   General Interventions Discussed/Reviewed General Interventions Discussed, General Interventions Reviewed, Doctor Visits, Durable Medical Equipment (DME)  Doctor Visits Discussed/Reviewed Doctor Visits Discussed, Doctor Visits Reviewed, Specialist  Durable Medical Equipment (DME) Other, Wheelchair  [CPAP,  [facility] ramp]  Wheelchair Standard  Education Interventions   Education Provided Provided Education  Provided Verbal Education On When to see the doctor, Exercise, Medication  Pharmacy Interventions   Pharmacy Dicussed/Reviewed Pharmacy Topics Discussed, Pharmacy Topics Reviewed, Medications and their functions          SDOH assessments and interventions completed:  No     Care Coordination Interventions:  Yes, provided   Follow up plan: Referral made to [id number] SDOH to assist with resources for W/C ramp  Follow up call scheduled for 08/26/22 @11 :30 AM    Encounter Outcome:  Pt. Visit Completed

## 2022-08-14 DIAGNOSIS — R339 Retention of urine, unspecified: Secondary | ICD-10-CM | POA: Diagnosis not present

## 2022-08-17 ENCOUNTER — Telehealth: Payer: Self-pay | Admitting: *Deleted

## 2022-08-17 DIAGNOSIS — R0902 Hypoxemia: Secondary | ICD-10-CM | POA: Diagnosis not present

## 2022-08-17 DIAGNOSIS — G4730 Sleep apnea, unspecified: Secondary | ICD-10-CM | POA: Diagnosis not present

## 2022-08-17 NOTE — Telephone Encounter (Signed)
   Telephone encounter was:  Successful.  08/17/2022 Name: XAIDYN WENNER MRN: [id number] DOB: Mar 22, 1950  XAZIER KOCHAN is a 72 y.o. year old male who is a primary care patient of Sharon Seller, NP . The community resource team was consulted for assistance with Home Modifications patient mailed resources for a ramp  Aging gracefully and independent living   Care guide performed the following interventions: Patient provided with information about care guide support team and interviewed to confirm resource needs.  Follow Up Plan:  No further follow up planned at this time. The patient has been provided with needed resources.  Yehuda Mao Greenauer -[facility], Population Health [phone] [address] [place] , [place] [address] Email : [email or web address]

## 2022-08-18 ENCOUNTER — Other Ambulatory Visit (HOSPITAL_COMMUNITY): Payer: Self-pay

## 2022-08-18 ENCOUNTER — Other Ambulatory Visit: Payer: Self-pay | Admitting: Nurse Practitioner

## 2022-08-18 DIAGNOSIS — M170 Bilateral primary osteoarthritis of knee: Secondary | ICD-10-CM

## 2022-08-18 MED ORDER — TRAMADOL HCL 50 MG PO TABS
50.0000 mg | ORAL_TABLET | Freq: Four times a day (QID) | ORAL | 0 refills | Status: DC | PRN
Start: 2022-08-18 — End: 2022-09-24
  Filled 2022-08-18: qty 30, 8d supply, fill #0

## 2022-08-19 ENCOUNTER — Other Ambulatory Visit (HOSPITAL_COMMUNITY): Payer: Self-pay

## 2022-08-19 NOTE — Telephone Encounter (Signed)
Pt results discussed @ OV w/ VS

## 2022-08-25 ENCOUNTER — Other Ambulatory Visit (HOSPITAL_BASED_OUTPATIENT_CLINIC_OR_DEPARTMENT_OTHER): Payer: Self-pay

## 2022-08-26 ENCOUNTER — Telehealth: Payer: Self-pay | Admitting: Pulmonary Disease

## 2022-08-26 ENCOUNTER — Ambulatory Visit: Payer: Self-pay

## 2022-08-26 DIAGNOSIS — G4734 Idiopathic sleep related nonobstructive alveolar hypoventilation: Secondary | ICD-10-CM

## 2022-08-26 NOTE — Patient Instructions (Signed)
Visit Information  Thank you for taking time to visit with me today. Please don't hesitate to contact me if I can be of assistance to you.   Following are the goals we discussed today:   Goals Addressed             This Visit's Progress    To obtain a new CPAP machine       Care Coordination Interventions: Evaluation of current treatment plan related to Obstructive Sleep Apnea and patient's adherence to plan as established by provider Determined patient has been experiencing low Oxygen saturations <88% without Oxygen, while resting Reviewed and discussed with patient [name] recommendations for patient to continue intermittent Oxygen to maintain O2 sats >90% Educated patient about the basic disease process related to hypoxia, which may occur with Oxygen sats less than 90% and patient verbalizes understanding Encouraged patient to wear his oxygen as needed in order to maintain O2 sats above 90% Determined patient completed the overnight pulse oximeter study, he has not been notified of his results  Placed outbound call to [name] office, spoke with [name], advised of patient's reported symptoms of running low O2 sats below 88%, advised patient has not been wearing his oxygen due to fear of getting dependent on Oxygen  Advised patient has not received his results for his overnight pulse oximeter sleep test to determined if a CPAP is needed Discussed [name] will send a message to [name] with the above information         Our next appointment is by telephone on 09/10/22 at 1:30 PM  Please call the care guide team at [phone] if you need to cancel or reschedule your appointment.   If you are experiencing a Mental Health or Behavioral Health Crisis or need someone to talk to, please call 1-800-273-TALK (toll free, 24 hour hotline)  Patient verbalizes understanding of instructions and care plan provided today and agrees to view in MyChart. Active MyChart status and patient  understanding of how to access instructions and care plan via MyChart confirmed with patient.     [name], RN, BSN, CCM Care Management Coordinator [facility] Care Management  Direct Phone: [phone]

## 2022-08-26 NOTE — Telephone Encounter (Signed)
Pt running O2 stats of 88 percent while resting, has o2 at home and pt has not been wearing it  instructed today to get the o2 continuosly as 2L until further eval can be completed  Not sched until sept  Had overnight pulse oximetry test completed ( PT wants a returning call for  results)

## 2022-08-26 NOTE — Patient Outreach (Signed)
  Care Coordination   Follow Up Visit Note   08/26/2022 Name: Thomas [name] MRN: [id number] DOB: 1950/11/09  Thomas [name] is a 72 y.o. year old male who sees [name], [name], NP for primary care. I spoke with  Thomas [name] by phone today.  What matters to the patients health and wellness today?  Patient would like to improve his oxygen status. He would like to receive his pulse oximeter night study results to determine if a CPAP will be needed.     Goals Addressed             This Visit's Progress    To obtain a new CPAP machine       Care Coordination Interventions: Evaluation of current treatment plan related to Obstructive Sleep Apnea and patient's adherence to plan as established by provider Determined patient has been experiencing low Oxygen saturations <88% without Oxygen, while resting Reviewed and discussed with patient [name] recommendations for patient to continue intermittent Oxygen to maintain O2 sats >90% Educated patient about the basic disease process related to hypoxia, which may occur with Oxygen sats less than 90% and patient verbalizes understanding Encouraged patient to wear his oxygen as needed in order to maintain O2 sats above 90% Determined patient completed the overnight pulse oximeter study, he has not been notified of his results  Placed outbound call to [name] office, spoke with [name], advised of patient's reported symptoms of running low O2 sats below 88%, advised patient has not been wearing his oxygen due to fear of getting dependent on Oxygen  Advised patient has not received his results for his overnight pulse oximeter sleep test to determined if a CPAP is needed Discussed [name] will send a message to [name] with the above information     Interventions Today    Flowsheet Row Most Recent Value  Chronic Disease   Chronic disease during today's visit Other  [OSA, low oxygen sats]  General Interventions   General  Interventions Discussed/Reviewed General Interventions Reviewed, General Interventions Discussed, Doctor Visits, Durable Medical Equipment (DME), Communication with  Doctor Visits Discussed/Reviewed Doctor Visits Reviewed, Doctor Visits Discussed, Specialist  Durable Medical Equipment (DME) Oxygen  Communication with PCP/Specialists  [[name]]  Education Interventions   Education Provided Provided Education  Provided Verbal Education On When to see the doctor  Pharmacy Interventions   Pharmacy Dicussed/Reviewed Pharmacy Topics Discussed, Pharmacy Topics Reviewed, Medications and their functions          SDOH assessments and interventions completed:  No     Care Coordination Interventions:  Yes, provided   Follow up plan: Referral made to SW [name] [name] to assist with resources for a W/C ramp Follow up call scheduled for 09/10/22 @1 :30 PM    Encounter Outcome:  Pt. Visit Completed

## 2022-08-27 ENCOUNTER — Ambulatory Visit: Payer: Self-pay

## 2022-08-27 NOTE — Patient Outreach (Signed)
  Care Coordination   Follow Up Visit Note   08/27/2022 Name: Thomas [name] MRN: [id number] DOB: Oct 04, 1950  Thomas [name] is a 72 y.o. year old male who sees [name], [name], NP for primary care. I spoke with  [name] by phone today.  What matters to the patients health and wellness today?  Patient would like to receive a wheelchair ramp    Goals Addressed             This Visit's Progress    Care Coordination Activities       Care Coordination Interventions: Collaboration with RN Care Manager who requests SW assistance with referral to Aging Gracefully Determined the patient is in need of a ramp in order to exit the home with his wheelchair. Patient is currently able t leave but unable to bring chair with him Education provided on services offered from Aging Gracefully program - patient is agreeable to referral Placed referral via [facility] Discussed plan for SW to follow up with the patient over the next three weeks. If the patient does not qualify for Aging Gracefully we will look at other resources for a ramp         SDOH assessments and interventions completed:  No     Care Coordination Interventions:  Yes, provided   Interventions Today    Flowsheet Row Most Recent Value  Chronic Disease   Chronic disease during today's visit Other  [need for a ramp]  General Interventions   General Interventions Discussed/Reviewed General Interventions Discussed, Community Resources  [Referral placed to [employer] Aging Gracefully program]        Follow up plan: Follow up call scheduled for 8/1    Encounter Outcome:  Pt. Visit Completed   [name], [name], CDP Social Worker, Certified Dementia Practitioner [facility] Care Management  Care Coordination [phone]

## 2022-08-27 NOTE — Telephone Encounter (Signed)
VS, please advise.  

## 2022-08-27 NOTE — Patient Instructions (Signed)
Visit Information  Thank you for taking time to visit with me today. Please don't hesitate to contact me if I can be of assistance to you.   Following are the goals we discussed today:  - Engage with Aging Gracefully program when they contact you   Our next appointment is by telephone on 8/1 at 11:00 am  Please call the care guide team at [phone] if you need to cancel or reschedule your appointment.   If you are experiencing a Mental Health or Behavioral Health Crisis or need someone to talk to, please go to [facility] [address] [place] [phone]) call 911  Patient verbalizes understanding of instructions and care plan provided today and agrees to view in MyChart. Active MyChart status and patient understanding of how to access instructions and care plan via MyChart confirmed with patient.     [name], [name], CDP Social Worker, Certified Dementia Practitioner [facility] Care Management  Care Coordination [phone]

## 2022-08-28 NOTE — Telephone Encounter (Signed)
I have not received his overnight oximetry yet.  Please ask the DME to sent it over when available.

## 2022-09-09 ENCOUNTER — Other Ambulatory Visit (HOSPITAL_COMMUNITY): Payer: Self-pay

## 2022-09-09 ENCOUNTER — Telehealth: Payer: Self-pay | Admitting: Pharmacist

## 2022-09-09 MED ORDER — MOUNJARO 12.5 MG/0.5ML ~~LOC~~ SOAJ
12.5000 mg | SUBCUTANEOUS | 0 refills | Status: DC
  Filled 2022-09-09 – 2022-09-10 (×2): qty 2, 28d supply, fill #0

## 2022-09-09 NOTE — Telephone Encounter (Signed)
Called pt to follow up with [facility]. He was able to pick up the 10mg  dose this past month. Tolerating well, wishes to increase to 12.5mg . Rx sent in.

## 2022-09-10 ENCOUNTER — Ambulatory Visit: Payer: Self-pay

## 2022-09-10 ENCOUNTER — Other Ambulatory Visit (HOSPITAL_COMMUNITY): Payer: Self-pay

## 2022-09-10 NOTE — Telephone Encounter (Signed)
I still have not received the overnight oximetry results.

## 2022-09-10 NOTE — Telephone Encounter (Signed)
ONO with CPAP 08/17/22 >> test time 11 hr 29 min.  Baseline SpO2 85%, low SpO2 77%.  Spent 9 hrs 58 min with an SpO2 < 88%.   Please let him know his oxygen level is low at night with CPAP.  He will need to have an in-lab CPAP titration study to qualify for supplemental oxygen with CPAP.  This is required by his insurance.  If he is agreeable, then please schedule the CPAP titration study.

## 2022-09-10 NOTE — Telephone Encounter (Signed)
Left message for Thomas [name] to call clinic.  Called [employer], DME company that processed ONO order for patient.  Spoke with [employer].  Brad emailed a copy of patients ONO  from 08/17/2022.  Copy faxed to [name] at [facility].  Notified Lillia Abed to give to Dr Craige Cotta once received.  Sent secure chat to [name] that patient needs order for O2 enrichment port attachment for patients new CPAP unit.  Must state how many liters of oxygen to be bled into CPAP per minute.  Per Nida Boatman, patient has a Product manager at home.  Will put copy of ONO in scan bin to be scanned into patients chart.  ONO faxed to [facility] for [name] to review.

## 2022-09-10 NOTE — Telephone Encounter (Signed)
Faxed 10 pages of ONO to [place] at [employer] at 4:50 PM.  Sent secure chat to [name].

## 2022-09-10 NOTE — Telephone Encounter (Signed)
Angel calling for patients oximetry results. Patient is currently using constant O2 and stats are in the 90's. Stated that results are needed due to patient receiving new CPAP machine and needs the attatchment for his oxygen use at night. Patient cannot receive until results are available. Please advise. Routing high priority as patient is having issues wearing his nose piece for O2, and CPAP therapy and not receiving full benefits.   Good call back number for [facility] [phone].

## 2022-09-10 NOTE — Telephone Encounter (Signed)
[name] do you have results available?

## 2022-09-11 ENCOUNTER — Other Ambulatory Visit (HOSPITAL_COMMUNITY): Payer: Self-pay

## 2022-09-11 NOTE — Patient Instructions (Signed)
Visit Information  Thank you for taking time to visit with me today. Please don't hesitate to contact me if I can be of assistance to you.   Following are the goals we discussed today:   Goals Addressed             This Visit's Progress    To obtain a new CPAP machine       Care Coordination Interventions: Evaluation of current treatment plan related to Obstructive Sleep Apnea and patient's adherence to plan as established by provider Determined patient is now wearing his oxygen continuously and maintaining O2 sats >93% Discussed with patient he does not have the connector to his CPAP allowing him to apply his oxygen for nighttime use, he is wearing his Oxygen via his nasal cannula, then applying his CPAP face mask over the cannula, he has not received his nighttime oximeter results from Pulmonology Placed outbound call to [name] office, spoke with Mardella Layman, advised patient has not received his nighttime oximeter results, advised he does not have the connector to his CPAP to apply his O2 and is wearing his O2 via nasal cannula under his CPAP face mask, requested this information be forwarded to [facility] for review and follow up Secure message received from [name] and Clyda Greener CMA with collaboration regarding patient's reported issue and they are working on following up on this issue         Our next appointment is by telephone on 09/24/22 at 12 PM  Please call the care guide team at [phone] if you need to cancel or reschedule your appointment.   If you are experiencing a Mental Health or Behavioral Health Crisis or need someone to talk to, please call 1-800-273-TALK (toll free, 24 hour hotline)  Patient verbalizes understanding of instructions and care plan provided today and agrees to view in MyChart. Active MyChart status and patient understanding of how to access instructions and care plan via MyChart confirmed with patient.     [name], RN, BSN, CCM Care Management  Coordinator [facility] Care Management  Direct Phone: [phone]

## 2022-09-11 NOTE — Telephone Encounter (Signed)
Spoke with pt and notified of results per [name]. Pt verbalized understanding and denied any questions. Pt agreeable to CPAP titration study and this was ordered

## 2022-09-11 NOTE — Patient Outreach (Signed)
  Care Coordination   Follow Up Visit Note   09/10/2022 Name: Thomas [name] MRN: [id number] DOB: [birth year]  Thomas [name] is a 72 y.o. year old male who sees [name], [name], NP for primary care. I spoke with  Thomas [name] by phone today.  What matters to the patients health and wellness today?  Patient would like to know about his nighttime oximeter test results. He would like assistance obtaining the Oxygen connector to his CPAP.     Goals Addressed             This Visit's Progress    To obtain a new CPAP machine       Care Coordination Interventions: Evaluation of current treatment plan related to Obstructive Sleep Apnea and patient's adherence to plan as established by provider Determined patient is now wearing his oxygen continuously and maintaining O2 sats >93% Discussed with patient he does not have the connector to his CPAP allowing him to apply his oxygen for nighttime use, he is wearing his Oxygen via his nasal cannula, then applying his CPAP face mask over the cannula, he has not received his nighttime oximeter results from Pulmonology Placed outbound call to [name] office, spoke with Mardella Layman, advised patient has not received his nighttime oximeter results, advised he does not have the connector to his CPAP to apply his O2 and is wearing his O2 via nasal cannula under his CPAP face mask, requested this information be forwarded to [facility] for review and follow up Secure message received from [name] and Clyda Greener CMA with collaboration regarding patient's reported issue and they are working on following up on this issue     Interventions Today    Flowsheet Row Most Recent Value  Chronic Disease   Chronic disease during today's visit Other  [OSA w/CPAP]  General Interventions   General Interventions Discussed/Reviewed General Interventions Discussed, General Interventions Reviewed, Doctor Visits, Durable Medical Equipment (DME)  Doctor Visits  Discussed/Reviewed Doctor Visits Discussed, Doctor Visits Reviewed, Specialist  Durable Medical Equipment (DME) Oxygen  Communication with PCP/Specialists  [[name]]  Education Interventions   Education Provided Provided Education  Provided Verbal Education On When to see the doctor  Pharmacy Interventions   Pharmacy Dicussed/Reviewed Pharmacy Topics Discussed, Pharmacy Topics Reviewed, Medications and their functions          SDOH assessments and interventions completed:  No     Care Coordination Interventions:  Yes, provided   Follow up plan: Referral made to [name] [name] to assist with resources to help with lawn care Follow up call scheduled for 09/24/22 @12 :00 PM     Encounter Outcome:  Pt. Visit Completed

## 2022-09-15 ENCOUNTER — Ambulatory Visit: Payer: Self-pay

## 2022-09-15 ENCOUNTER — Telehealth: Payer: Self-pay | Admitting: Pulmonary Disease

## 2022-09-15 NOTE — Patient Outreach (Signed)
  Care Coordination   Provider Collaboration   Visit Note   09/15/2022 Name: Thomas [name] MRN: [id number] DOB: 1950-11-28  Thomas [name] is a 72 y.o. year old male who sees [name], [name], NP for primary care. I placed an outbound call to Amy RN at the providers office today.  What matters to the patients health and wellness today?  N/a    Goals Addressed             This Visit's Progress    To obtain a new CPAP machine       Care Coordination Interventions: Evaluation of current treatment plan related to Obstructive Sleep Apnea and patient's adherence to plan as established by provider Received a voice message from Amy with [facility] requesting a return call regarding patient's nighttime pulse oximeter results  Placed outbound call to [facility], left a message for Amy to return the call at her earliest convenience         SDOH assessments and interventions completed:  No     Care Coordination Interventions:  Yes, provided   Follow up plan: Follow up call scheduled for 09/24/22 @12  PM    Encounter Outcome:  Pt. Visit Completed

## 2022-09-17 ENCOUNTER — Ambulatory Visit: Payer: Self-pay

## 2022-09-17 DIAGNOSIS — R339 Retention of urine, unspecified: Secondary | ICD-10-CM | POA: Diagnosis not present

## 2022-09-17 NOTE — Patient Outreach (Signed)
  Care Coordination   Follow Up Visit Note   09/17/2022 Name: Thomas [name] MRN: [id number] DOB: [birth year]  Thomas [name] is a 72 y.o. year old male who sees [name], [name], NP for primary care. I spoke with  [name] by phone today.  What matters to the patients health and wellness today?  Enrollment into Aging Gracefully    Goals Addressed             This Visit's Progress    Care Coordination Activities   On track    Care Coordination Interventions: Discussed the patient has yet to be contacted by [employer] to enroll in the Aging Gracefully Program Reviewed [id number] dashboard to note the patients referral has yet to be acted on Collaboration with Vito Berger, Associate ED of [employer] to resend referral. Elnita Maxwell confirmed receipt and reports the agency will outreach the patient  Determined the patient is awaiting a call to schedule an appointment with [name] Performed chart review to note the patient is scheduled with [name] on 8/27 Advised the patient to contact [name] office for specific appointment related questions        SDOH assessments and interventions completed:  No     Care Coordination Interventions:  Yes, provided   Interventions Today    Flowsheet Row Most Recent Value  Chronic Disease   Chronic disease during today's visit Other  [Interest in Aging Gracefully]  General Interventions   General Interventions Discussed/Reviewed General Interventions Reviewed, [facility], Communication with, Doctor Visits  [Communication with Vito Berger with [employer] to follow up on referral to Aging Gracefully]  Doctor Visits Discussed/Reviewed Doctor Visits Reviewed  [Reviewed upcomming appointments with the patient,  intructed to contact his provider regarding questions related to upcomming appt with [name]        Follow up plan:  SW will continue to  follow    Encounter Outcome:  Pt. Visit Completed    [name], [name], CDP Social Worker, Certified Dementia Practitioner [facility] Care Management  Care Coordination [phone]

## 2022-09-17 NOTE — Patient Instructions (Signed)
Visit Information  Thank you for taking time to visit with me today. Please don't hesitate to contact me if I can be of assistance to you.   Following are the goals we discussed today:  - Contact [name] office to obtain instructions for your appointment on 8/27 - Engage with [employer] regarding enrollment into Aging Gracefully - they will call you  Our next appointment is by telephone on 9/5 at 12:30  Please call the care guide team at [phone] if you need to cancel or reschedule your appointment.   If you are experiencing a Mental Health or Behavioral Health Crisis or need someone to talk to, please call 1-800-273-TALK (toll free, 24 hour hotline) go to [facility] [address], [place] [phone]) call 911  Patient verbalizes understanding of instructions and care plan provided today and agrees to view in MyChart. Active MyChart status and patient understanding of how to access instructions and care plan via MyChart confirmed with patient.     [name], [name], CDP Social Worker, Certified Dementia Practitioner [facility] Care Management  Care Coordination [phone]

## 2022-09-18 NOTE — Telephone Encounter (Signed)
Spoke with patient regarding prior message . Advised patient his ONO result's  ONO with CPAP 08/17/22 >> test time 11 hr 29 min.  Baseline SpO2 85%, low SpO2 77%.  Spent 9 hrs 58 min with an SpO2 < 88%.     Please let Thomas [name] know his oxygen level is low at night with CPAP.  He will need to have an in-lab CPAP titration study to qualify for supplemental oxygen with CPAP.  This is required by his insurance.  If he is agreeable, then please schedule the CPAP titration study.   Thomas [name] had already put in a CPAP titration order and date is 10/13/22 and patient has been notified.  Nothing else further needed at this request.

## 2022-09-21 ENCOUNTER — Encounter: Payer: Self-pay | Admitting: Pulmonary Disease

## 2022-09-22 ENCOUNTER — Other Ambulatory Visit (HOSPITAL_COMMUNITY): Payer: Self-pay

## 2022-09-24 ENCOUNTER — Ambulatory Visit: Payer: Self-pay

## 2022-09-24 ENCOUNTER — Other Ambulatory Visit (HOSPITAL_COMMUNITY): Payer: Self-pay

## 2022-09-24 ENCOUNTER — Other Ambulatory Visit: Payer: Self-pay | Admitting: Nurse Practitioner

## 2022-09-24 DIAGNOSIS — M170 Bilateral primary osteoarthritis of knee: Secondary | ICD-10-CM

## 2022-09-24 MED ORDER — TRAMADOL HCL 50 MG PO TABS
50.0000 mg | ORAL_TABLET | Freq: Four times a day (QID) | ORAL | 0 refills | Status: DC | PRN
Start: 2022-09-24 — End: 2022-12-01
  Filled 2022-09-24: qty 30, 8d supply, fill #0

## 2022-09-24 NOTE — Telephone Encounter (Signed)
Patient has request refill on medication Tramadol 50mg . Patient medication last refilled 08/18/2022. Patient has Non Opioid Contract on file dated 07/01/2022. Patient medication pend and sent to PCP Janyth Contes [name], NP for approval.

## 2022-09-25 NOTE — Patient Instructions (Signed)
Visit Information  Thank you for taking time to visit with me today. Please don't hesitate to contact me if I can be of assistance to you.   Following are the goals we discussed today:   Goals Addressed             This Visit's Progress    To obtain a new CPAP machine   On track    Care Coordination Interventions: Evaluation of current treatment plan related to Obstructive Sleep Apnea and patient's adherence to plan as established by provider Discussed with patient that he received his nighttime oximeter results per [name]  Reviewed and discussed with patient the following MD recommendations;  ONO with CPAP 08/17/22 >> test time 11 hr 29 min.  Baseline SpO2 85%, low SpO2 77%.  Spent 9 hrs 58 min with an SpO2 < 88%.  Please let him know his oxygen level is low at night with CPAP.  He will need to have an in-lab CPAP titration study to qualify for supplemental oxygen with CPAP.  This is required by his insurance.  If he is agreeable, then please schedule the CPAP titration study.     Reviewed with patient upcoming scheduled CPAP titration at [facility] set for 10/13/22 @8  PM Determined patient is currently maintaining O2 sats around 94% with continuous Oxygen use Instructed patient to notify his doctor of new symptoms or concerns         Our next appointment is by telephone on 11/11/22 at 12:30 PM  Please call the care guide team at [phone] if you need to cancel or reschedule your appointment.   If you are experiencing a Mental Health or Behavioral Health Crisis or need someone to talk to, please call 1-800-273-TALK (toll free, 24 hour hotline)  Patient verbalizes understanding of instructions and care plan provided today and agrees to view in MyChart. Active MyChart status and patient understanding of how to access instructions and care plan via MyChart confirmed with patient.     [name], RN, BSN, CCM Care Management Coordinator [facility] Care Management Direct  Phone: [phone]

## 2022-09-25 NOTE — Patient Outreach (Signed)
  Care Coordination   Follow Up Visit Note   09/25/2022 Name: RONELLE ATTIG MRN: [id number] DOB: 07-14-1950  JAHRED HEFFERON is a 72 y.o. year old male who sees [name], [name], NP for primary care. I spoke with  [name] by phone today.  What matters to the patients health and wellness today?  Patient would like to complete his CPAP titration study. Patient continues to need resources for a W/C ramp for his home.     Goals Addressed             This Visit's Progress    To obtain a new CPAP machine   On track    Care Coordination Interventions: Evaluation of current treatment plan related to Obstructive Sleep Apnea and patient's adherence to plan as established by provider Discussed with patient that he received his nighttime oximeter results per [name]  Reviewed and discussed with patient the following MD recommendations;  ONO with CPAP 08/17/22 >> test time 11 hr 29 min.  Baseline SpO2 85%, low SpO2 77%.  Spent 9 hrs 58 min with an SpO2 < 88%.  Please let him know his oxygen level is low at night with CPAP.  He will need to have an in-lab CPAP titration study to qualify for supplemental oxygen with CPAP.  This is required by his insurance.  If he is agreeable, then please schedule the CPAP titration study.     Reviewed with patient upcoming scheduled CPAP titration at [facility] set for 10/13/22 @8  PM Determined patient is currently maintaining O2 sats around 94% with continuous Oxygen use Instructed patient to notify his doctor of new symptoms or concerns     Interventions Today    Flowsheet Row Most Recent Value  Chronic Disease   Chronic disease during today's visit Other, Diabetes  [OSA w/CPAP]  General Interventions   General Interventions Discussed/Reviewed General Interventions Discussed, General Interventions Reviewed, Doctor Visits, Labs, Communication with  Doctor Visits Discussed/Reviewed Doctor Visits Discussed, Doctor Visits  Reviewed, Specialist, PCP  Durable Medical Equipment (DME) Oxygen  Communication with Social Work  [employer] [name] re: resources for [employer],  W/C ramp]  Education Interventions   Education Provided Provided Education  Provided Engineer, petroleum On Labs, Medication, When to see the doctor  [nighttime oximeter results]  Pharmacy Interventions   Pharmacy Dicussed/Reviewed Pharmacy Topics Reviewed, Pharmacy Topics Discussed  Safety Interventions   Safety Discussed/Reviewed Ambulance person for community resources  [WC ramp needed, collaboration with [name] [name]]          SDOH assessments and interventions completed:  No     Care Coordination Interventions:  Yes, provided   Follow up plan: Follow up call scheduled for 11/11/22 @12 :30 PM     Encounter Outcome:  Pt. Visit Completed

## 2022-09-28 ENCOUNTER — Other Ambulatory Visit (HOSPITAL_COMMUNITY): Payer: Self-pay

## 2022-10-01 ENCOUNTER — Other Ambulatory Visit: Payer: Self-pay | Admitting: Nurse Practitioner

## 2022-10-01 DIAGNOSIS — I10 Essential (primary) hypertension: Secondary | ICD-10-CM

## 2022-10-01 NOTE — Telephone Encounter (Signed)
Patient medication has warnings. Medication pend and sent to PCP Janyth Contes [name], NP for approval.

## 2022-10-07 ENCOUNTER — Telehealth: Payer: Self-pay | Admitting: Pharmacist

## 2022-10-07 ENCOUNTER — Other Ambulatory Visit (HOSPITAL_COMMUNITY): Payer: Self-pay

## 2022-10-07 MED ORDER — MOUNJARO 15 MG/0.5ML ~~LOC~~ SOAJ
15.0000 mg | SUBCUTANEOUS | 11 refills | Status: DC
  Filled 2022-10-07: qty 2, 28d supply, fill #0
  Filled 2023-02-25: qty 2, 28d supply, fill #1

## 2022-10-07 NOTE — Telephone Encounter (Addendum)
Called pt to follow up with Mounjaro tolerability. He is tolerating well. Will increase to 15mg  max dose, pt aware to call with any future concerns.

## 2022-10-12 ENCOUNTER — Other Ambulatory Visit (HOSPITAL_COMMUNITY): Payer: Self-pay

## 2022-10-13 ENCOUNTER — Ambulatory Visit (HOSPITAL_BASED_OUTPATIENT_CLINIC_OR_DEPARTMENT_OTHER): Payer: Medicare HMO | Attending: Pulmonary Disease | Admitting: Internal Medicine

## 2022-10-13 VITALS — Ht 69.0 in | Wt 330.0 lb

## 2022-10-13 DIAGNOSIS — I493 Ventricular premature depolarization: Secondary | ICD-10-CM | POA: Insufficient documentation

## 2022-10-13 DIAGNOSIS — G4734 Idiopathic sleep related nonobstructive alveolar hypoventilation: Secondary | ICD-10-CM | POA: Diagnosis not present

## 2022-10-13 DIAGNOSIS — G4733 Obstructive sleep apnea (adult) (pediatric): Secondary | ICD-10-CM | POA: Insufficient documentation

## 2022-10-16 DIAGNOSIS — R339 Retention of urine, unspecified: Secondary | ICD-10-CM | POA: Diagnosis not present

## 2022-10-19 ENCOUNTER — Telehealth: Payer: Self-pay | Admitting: Pulmonary Disease

## 2022-10-19 DIAGNOSIS — G4734 Idiopathic sleep related nonobstructive alveolar hypoventilation: Secondary | ICD-10-CM | POA: Diagnosis not present

## 2022-10-19 DIAGNOSIS — G4733 Obstructive sleep apnea (adult) (pediatric): Secondary | ICD-10-CM

## 2022-10-19 NOTE — Telephone Encounter (Signed)
CPAP 10/13/22 >> CPAP 12 cm H2O >> AHI 0, didn't need supplemental oxygen.   Please let him know his sleep study showed that once his CPAP setting was increased to 12 cm H2O his oxygen level improved.  He doesn't need supplemental oxygen at night.  Please let him know I have sent an order to his DME to change his CPAP to 12 cm H2O.

## 2022-10-19 NOTE — Procedures (Signed)
     Patient Name: Thomas [name], Thomas [name] Date: 10/13/2022 Gender: Male D.O.B: [birth year] Age (years): 66 Referring Provider: Coralyn Helling MD, ABSM Height (inches): 69 Interpreting Physician: Jetty Duhamel MD, ABSM Weight (lbs): 330 RPSGT: Armen Pickup BMI: 49 MRN: [id number] Neck Size: 19.00  CLINICAL INFORMATION The patient is referred for a CPAP titration to treat sleep apnea.  Date of NPSG, Split Night or HST:  NPSG 06/28/22  AHI 107/hr, desaturation to 84%, body weight 330 lbs  SLEEP STUDY TECHNIQUE As per the AASM Manual for the Scoring of Sleep and Associated Events v2.3 (April 2016) with a hypopnea requiring 4% desaturations.  The channels recorded and monitored were frontal, central and occipital EEG, electrooculogram (EOG), submentalis EMG (chin), nasal and oral airflow, thoracic and abdominal wall motion, anterior tibialis EMG, snore microphone, electrocardiogram, and pulse oximetry. Continuous positive airway pressure (CPAP) was initiated at the beginning of the study and titrated to treat sleep-disordered breathing.  MEDICATIONS Medications self-administered by patient taken the night of the study : APRESOLINE, COREG, Klor-con m, LIPITOR, NORVASC, ULTRAM, xarelto  TECHNICIAN COMMENTS Comments added by technician: No restroom visted. Patient was restless all through the night. Comments added by scorer: N/A RESPIRATORY PARAMETERS Optimal PAP Pressure (cm): 12 AHI at Optimal Pressure (/hr): 0 Overall Minimal O2 (%): 85.0 Supine % at Optimal Pressure (%): 67 Minimal O2 at Optimal Pressure (%): 87.0   SLEEP ARCHITECTURE The study was initiated at 9:18:15 PM and ended at 4:13:52 AM.  Sleep onset time was 27.7 minutes and the sleep efficiency was 60.9%. The total sleep time was 253 minutes.  The patient spent 6.5% of the night in stage N1 sleep, 78.3% in stage N2 sleep, 0.0% in stage N3 and 15.2% in REM.Stage REM latency was 119.0 minutes  Wake after sleep onset was  134.9. Alpha intrusion was absent. Supine sleep was 39.67%.  CARDIAC DATA The 2 lead EKG demonstrated sinus rhythm. The mean heart rate was 74.2 beats per minute. Other EKG findings include: PVCs.  LEG MOVEMENT DATA The total Periodic Limb Movements of Sleep (PLMS) were 0. The PLMS index was 0.0. A PLMS index of <15 is considered normal in adults.  IMPRESSIONS - The optimal PAP pressure was 12 cm of water. - Moderate oxygen desaturations were observed during this titration (min O2 = 85.0%). Minimum O2 saturation on CPAP 12 was 87% and Mean 90%. - No snoring was audible during this study. - 2-lead EKG demonstrated: PVCs - Clinically significant periodic limb movements were not noted during this study. Arousals associated with PLMs were rare.  DIAGNOSIS - Obstructive Sleep Apnea (G47.33)  RECOMMENDATIONS - Trial of CPAP therapy on 12 cm H2O or autopap 5-15. - Patient wore a Large size Resmed Full Face Mirage Quattro mask and heated humidification. - Be careful with alcohol, sedatives and other CNS depressants that may worsen sleep apnea and disrupt normal sleep architecture. - Sleep hygiene should be reviewed to assess factors that may improve sleep quality. - Weight management and regular exercise should be initiated or continued.  [Electronically signed] 10/19/2022 11:53 AM  Jetty Duhamel MD, ABSM Diplomate, American Board of Sleep Medicine NPI: [id number]                        Jetty Duhamel Diplomate, American Board of Sleep Medicine  ELECTRONICALLY SIGNED ON:  10/19/2022, 11:49 AM [facility] PH: [phone]   FX: [phone] ACCREDITED BY THE AMERICAN ACADEMY OF SLEEP MEDICINE

## 2022-10-22 ENCOUNTER — Ambulatory Visit: Payer: Self-pay

## 2022-10-22 NOTE — Patient Instructions (Signed)
Visit Information  Thank you for taking time to visit with me today. Please don't hesitate to contact me if I can be of assistance to you.   Following are the goals we discussed today:  - Call [facility] at [phone] to request assistance with a ramp   Our next appointment is by telephone on 10/1 at 12:30  Please call the care guide team at [phone] if you need to cancel or reschedule your appointment.   If you are experiencing a Mental Health or Behavioral Health Crisis or need someone to talk to, please call 1-800-273-TALK (toll free, 24 hour hotline) go to [facility] [address] [place] [phone]) call 911  Patient verbalizes understanding of instructions and care plan provided today and agrees to view in MyChart. Active MyChart status and patient understanding of how to access instructions and care plan via MyChart confirmed with patient.     [name], [name], CDP Social Worker, Certified Dementia Practitioner [facility] Care Management  Care Coordination [phone]

## 2022-10-22 NOTE — Patient Outreach (Signed)
  Care Coordination   Follow Up Visit Note   10/22/2022 Name: Thomas [name] MRN: [id number] DOB: 1950-03-13  Thomas [name] is a 72 y.o. year old male who sees [name], [name], NP for primary care. I spoke with  [name] by phone today.  What matters to the patients health and wellness today?  To obtain a ramp    Goals Addressed             This Visit's Progress    Care Coordination Activities       Care Coordination Interventions: Collaboration with [employer] who indicates patient does not qualify for Aging Gracefully due to owning more than one property Educated the patient on alternative community reosurce [place] Doctor, general practice ([facility]) advising this resource will assist with building a wooden ramp  Discussed the patient would like someone to come out to his home to determine if building a ramp is possible - advised the patient he would have to contact [place] [facility] to discuss his needs and next steps Provided the patient with the contact number to [facility] [facility] to begin the referral process         SDOH assessments and interventions completed:  No     Care Coordination Interventions:  Yes, provided   Interventions Today    Flowsheet Row Most Recent Value  Chronic Disease   Chronic disease during today's visit Diabetes, Other  [Impaired Mobility]  General Interventions   General Interventions Discussed/Reviewed General Interventions Reviewed, Community Resources  [Communication with [employer] who advises patient does not qualify for their program due to owning more than one property]  Education Interventions   Education Provided Provided Education  Provided Verbal Education On [facility]  [Educated patient on [employer] [facility] [place] ( [facility])]        Follow up plan:  SW will continue to follow    Encounter Outcome:  Patient Visit Completed   [name], [name], CDP Social Worker, Certified  Dementia Practitioner [facility] Care Management  Care Coordination [phone]

## 2022-10-26 NOTE — Telephone Encounter (Signed)
Called the pt and there was no answer- LMTCB    

## 2022-11-02 DIAGNOSIS — I2510 Atherosclerotic heart disease of native coronary artery without angina pectoris: Secondary | ICD-10-CM | POA: Insufficient documentation

## 2022-11-02 NOTE — Progress Notes (Unsigned)
Cardiology Office Note:    Date:  11/03/2022  ID:  Thomas [name], DOB July 18, 1950, MRN [id number] PCP: Sharon Seller, NP  [facility] Providers Cardiologist:  Christell Constant, MD       Patient Profile:      HFmrEF (heart failure with mildly reduced ejection fraction)  Nonischemic cardiomyopathy TTE 06/02/2017: EF 45-50, PASP 35 TTE 05/13/2022: EF 45-50, global HK, moderate LVH, GR 1 DD, normal RVSF, moderately elevated PASP, mild to moderate LAE, moderate RAE, trivial AI, AV sclerosis, RAP 15, RVSP 51 Pulmonary hypertension RHC 07/06/2017: LVEDP 30, EF 45-50, moderate to severe pulmonary hypertension, mean PA 40, PCWP 21 RVSP 51 (TTE 04/2022), mean PA 40 (RHC 06/2017) Coronary artery disease, nonobstructive LHC 07/06/17: proximal LCx 25 Paroxysmal atrial fibrillation Hx DVT, pulmonary embolism in 2018 Chronic kidney disease Diabetes mellitus Hypertension Sleep apnea [name] Prostate CA Urinary retention, suprapubic catheter Obesity        History of Present Illness:  Thomas [name] is a 72 y.o. male who returns for follow-up of CHF, pulmonary hypertension, CAD.  He was last seen by Dr. Eldridge Dace in March 2024.  He saw our pharmacy clinic in March 2024 and was placed on GLP-1 agonist for management of diabetes, obesity.  Spironolactone was also added for progression of GDMT for CHF.   Discussed the use of AI scribe software for clinical note transcription with the patient, who gave verbal consent to proceed.    He is here with his wife.  He notes worsening shortness of breath for about two and a half years.  He is concerned his shortness of breath is related to a recalled CPAP machine.  He describes heaviness in his chest associated with the shortness of breath.  He uses oxygen to alleviate this feeling.  He has not had exertional chest pain.  He has not had leg edema.  His weights have been stable.    ROS: He has not had melena, hematochezia, hematuria.  See HPI.    Studies Reviewed:        Risk Assessment/Calculations:             Physical Exam:   VS:  BP 120/60 (BP Location: Left Arm, Patient Position: Sitting, Cuff Size: Large)   Pulse 84   Ht 5\' 9"  (1.753 m)   Wt 295 lb (133.8 kg)   SpO2 (!) 86%   BMI 43.56 kg/m    Wt Readings from Last 3 Encounters:  11/03/22 295 lb (133.8 kg)  10/13/22 (!) 330 lb (149.7 kg)  07/30/22 (!) 311 lb 15.2 oz (141.5 kg)    Constitutional:      Appearance: Not in distress. Chronically ill-appearing.  Neck:     Vascular: No JVR.  Pulmonary:     Breath sounds: Normal breath sounds. No wheezing. No rales.  Cardiovascular:     Normal rate. Regular rhythm.     Murmurs: There is no murmur.  Edema:    Peripheral edema absent.  Abdominal:     Palpations: Abdomen is soft.  Skin:    General: Skin is warm and dry.        Assessment and Plan:     Heart Failure with Reduced Ejection Fraction EF 45-50% by echocardiogram in March 2024.  His functional status is impacted by his respiratory issues as well as his cardiac issues.  NYHA IIIb.  Stable volume status on exam today. Current GDMT includes carvedilol 12.5mg  BID, Jardiance 10mg  daily, losartan 50mg  daily, and  spironolactone 50mg  daily. -Continue medications as listed above, furosemide 20 mg daily -Obtain CMET today -If renal function remains stable, consider switching losartan to Entresto. -Follow-up 6 months  Chronic Respiratory Failure On continuous O2.  Multifactorial etiology including CHF, chronic thromboembolic pulmonary hypertension, obesity, and deconditioning. -Continue follow-up with Pulmonology. -Obtain BNP today.  Pulmonary Hypertension Moderately elevated PASP by echocardiogram in March 2024. Likely related to chronic PE, CHF, sleep apnea. -Continue follow-up with Pulmonology.  Coronary Artery Disease Minimal non-obstructive disease by cardiac catheterization in May 2019.  He is not describing cardiac chest pain. -Continue  aspirin 81mg  daily and Lipitor 40mg  daily. -Check CMET, lipid panel today.  Paroxysmal Atrial Fibrillation Regular rhythm on exam today.  He appears to be in normal sinus rhythm. -Continue Xarelto 20mg  daily. -Check CBC and CMET today.  History of DVT and Pulmonary Embolism Occurred in 2018, currently on long-term anticoagulation with Xarelto 20mg  daily.  Hypertension Controlled. -Continue amlodipine 10mg  daily, carvedilol 12.5mg  BID, hydralazine 25mg  TID, losartan 50mg  daily, and spironolactone 50mg  daily.  Obesity He was started on tirzepatide earlier this year.  Weight decreased from 337lbs to 295lbs.  Chronic Kidney Disease Most recent creatinine 1.34. -Check CMET today.  Sleep Apnea Followed by Pulmonology.   Diabetes mellitus  Pt requests A1c be drawn today. Will obtain A1c and send to PCP.         Dispo:  Return in about 6 months (around 05/03/2023) for Routine Follow Up w/ Dr. Izora Ribas.  Signed, Tereso Newcomer, PA-C

## 2022-11-03 ENCOUNTER — Encounter: Payer: Self-pay | Admitting: Physician Assistant

## 2022-11-03 ENCOUNTER — Ambulatory Visit: Payer: Medicare HMO | Attending: Physician Assistant | Admitting: Physician Assistant

## 2022-11-03 VITALS — BP 120/60 | HR 84 | Ht 69.0 in | Wt 295.0 lb

## 2022-11-03 DIAGNOSIS — I2510 Atherosclerotic heart disease of native coronary artery without angina pectoris: Secondary | ICD-10-CM | POA: Diagnosis not present

## 2022-11-03 DIAGNOSIS — Z86711 Personal history of pulmonary embolism: Secondary | ICD-10-CM

## 2022-11-03 DIAGNOSIS — I272 Pulmonary hypertension, unspecified: Secondary | ICD-10-CM

## 2022-11-03 DIAGNOSIS — I480 Paroxysmal atrial fibrillation: Secondary | ICD-10-CM | POA: Diagnosis not present

## 2022-11-03 DIAGNOSIS — I5022 Chronic systolic (congestive) heart failure: Secondary | ICD-10-CM

## 2022-11-03 DIAGNOSIS — E1122 Type 2 diabetes mellitus with diabetic chronic kidney disease: Secondary | ICD-10-CM

## 2022-11-03 DIAGNOSIS — I5020 Unspecified systolic (congestive) heart failure: Secondary | ICD-10-CM | POA: Diagnosis not present

## 2022-11-03 DIAGNOSIS — N1831 Chronic kidney disease, stage 3a: Secondary | ICD-10-CM

## 2022-11-03 DIAGNOSIS — I10 Essential (primary) hypertension: Secondary | ICD-10-CM | POA: Diagnosis not present

## 2022-11-03 DIAGNOSIS — J9611 Chronic respiratory failure with hypoxia: Secondary | ICD-10-CM | POA: Diagnosis not present

## 2022-11-03 DIAGNOSIS — Z7984 Long term (current) use of oral hypoglycemic drugs: Secondary | ICD-10-CM

## 2022-11-03 DIAGNOSIS — G4733 Obstructive sleep apnea (adult) (pediatric): Secondary | ICD-10-CM

## 2022-11-03 NOTE — Patient Instructions (Signed)
Medication Instructions:  Your physician recommends that you continue on your current medications as directed. Please refer to the Current Medication list given to you today.  *If you need a refill on your cardiac medications before your next appointment, please call your pharmacy*   Lab Work: TODAY:  CMET, HGBA1C, LIPID, & CBC  If you have labs (blood work) drawn today and your tests are completely normal, you will receive your results only by: MyChart Message (if you have MyChart) OR A paper copy in the mail If you have any lab test that is abnormal or we need to change your treatment, we will call you to review the results.   Testing/Procedures: None orderd   Follow-Up: At [facility], you and your health needs are our priority.  As part of our continuing mission to provide you with exceptional heart care, we have created designated Provider Care Teams.  These Care Teams include your primary Cardiologist (physician) and Advanced Practice Providers (APPs -  Physician Assistants and Nurse Practitioners) who all work together to provide you with the care you need, when you need it.  We recommend signing up for the patient portal called "MyChart".  Sign up information is provided on this After Visit Summary.  MyChart is used to connect with patients for Virtual Visits (Telemedicine).  Patients are able to view lab/test results, encounter notes, upcoming appointments, etc.  Non-urgent messages can be sent to your provider as well.   To learn more about what you can do with MyChart, go to [email or web address].    Your next appointment:   6 month(s)  Provider:   Christell Constant, MD     Other Instructions

## 2022-11-04 DIAGNOSIS — Z466 Encounter for fitting and adjustment of urinary device: Secondary | ICD-10-CM | POA: Diagnosis not present

## 2022-11-04 DIAGNOSIS — R339 Retention of urine, unspecified: Secondary | ICD-10-CM | POA: Diagnosis not present

## 2022-11-04 DIAGNOSIS — N320 Bladder-neck obstruction: Secondary | ICD-10-CM | POA: Diagnosis not present

## 2022-11-04 LAB — COMPREHENSIVE METABOLIC PANEL
ALT: 36 IU/L (ref 0–44)
AST: 21 IU/L (ref 0–40)
Albumin: 4.2 g/dL (ref 3.8–4.8)
Alkaline Phosphatase: 94 IU/L (ref 44–121)
BUN/Creatinine Ratio: 12 (ref 10–24)
BUN: 16 mg/dL (ref 8–27)
Bilirubin Total: 0.6 mg/dL (ref 0.0–1.2)
CO2: 21 mmol/L (ref 20–29)
Calcium: 9.9 mg/dL (ref 8.6–10.2)
Chloride: 106 mmol/L (ref 96–106)
Creatinine, Ser: 1.3 mg/dL — ABNORMAL HIGH (ref 0.76–1.27)
Globulin, Total: 3.1 g/dL (ref 1.5–4.5)
Glucose: 102 mg/dL — ABNORMAL HIGH (ref 70–99)
Potassium: 4.9 mmol/L (ref 3.5–5.2)
Sodium: 142 mmol/L (ref 134–144)
Total Protein: 7.3 g/dL (ref 6.0–8.5)
eGFR: 58 mL/min/{1.73_m2} — ABNORMAL LOW (ref >59)

## 2022-11-04 LAB — CBC
Hematocrit: 43.2 % (ref 37.5–51.0)
Hemoglobin: 14 g/dL (ref 13.0–17.7)
MCH: 28.4 pg (ref 26.6–33.0)
MCHC: 32.4 g/dL (ref 31.5–35.7)
MCV: 88 fL (ref 79–97)
Platelets: 269 10*3/uL (ref 150–450)
RBC: 4.93 x10E6/uL (ref 4.14–5.80)
RDW: 13.3 % (ref 11.6–15.4)
WBC: 8.6 10*3/uL (ref 3.4–10.8)

## 2022-11-04 LAB — HEMOGLOBIN A1C
Est. average glucose Bld gHb Est-mCnc: 114 mg/dL
Hgb A1c MFr Bld: 5.6 % (ref 4.8–5.6)

## 2022-11-04 LAB — LIPID PANEL
Chol/HDL Ratio: 2.4 ratio (ref 0.0–5.0)
Cholesterol, Total: 109 mg/dL (ref 100–199)
HDL: 46 mg/dL (ref >39)
LDL Chol Calc (NIH): 48 mg/dL (ref 0–99)
Triglycerides: 75 mg/dL (ref 0–149)
VLDL Cholesterol Cal: 15 mg/dL (ref 5–40)

## 2022-11-06 ENCOUNTER — Encounter (HOSPITAL_BASED_OUTPATIENT_CLINIC_OR_DEPARTMENT_OTHER): Payer: Self-pay | Admitting: Pulmonary Disease

## 2022-11-06 ENCOUNTER — Ambulatory Visit (HOSPITAL_BASED_OUTPATIENT_CLINIC_OR_DEPARTMENT_OTHER): Payer: Medicare HMO | Admitting: Pulmonary Disease

## 2022-11-06 ENCOUNTER — Other Ambulatory Visit (HOSPITAL_COMMUNITY): Payer: Self-pay

## 2022-11-06 VITALS — BP 92/72 | HR 78 | Resp 16 | Ht 69.0 in | Wt 295.0 lb

## 2022-11-06 DIAGNOSIS — Z23 Encounter for immunization: Secondary | ICD-10-CM

## 2022-11-06 DIAGNOSIS — J9611 Chronic respiratory failure with hypoxia: Secondary | ICD-10-CM | POA: Diagnosis not present

## 2022-11-06 DIAGNOSIS — G4733 Obstructive sleep apnea (adult) (pediatric): Secondary | ICD-10-CM | POA: Diagnosis not present

## 2022-11-06 DIAGNOSIS — R0609 Other forms of dyspnea: Secondary | ICD-10-CM

## 2022-11-06 NOTE — Addendum Note (Signed)
Addended by: Jama Flavors on: 11/06/2022 09:30 AM   Modules accepted: Orders

## 2022-11-06 NOTE — Patient Instructions (Signed)
Will make sure your pulmonary function test gets scheduled  High dose flu shot today  Follow up in 4 months

## 2022-11-06 NOTE — Progress Notes (Signed)
[facility]  Chief Complaint  Patient presents with   Follow-up    Past Surgical History:  He  has a past surgical history that includes Knee surgery; Shoulder surgery; Hernia repair; Prostate surgery; uretha surgery-2014; Radioactive seed implant; Colonoscopy; Multiple tooth extractions; Total knee arthroplasty (Right, 11/19/2016); Cardioversion (N/A, 06/03/2017); RIGHT/LEFT HEART CATH AND CORONARY ANGIOGRAPHY (N/A, 07/06/2017); Colonoscopy with propofol (N/A, 10/20/2018); polypectomy (10/20/2018); and Cataract extraction (Left, 07/31/2021).  Past Medical History:  Diastolic CHF, Arthritis, Prostate cancer, CKD, Neuropathy, DVT, GERD, HTN, PNA, Suprapubic catheter  Constitutional:  BP 92/72   Pulse 78   Resp 16   Ht 5\' 9"  (1.753 m)   Wt 295 lb (133.8 kg) Comment: unable to obtain today-this last weight he had  SpO2 94%   BMI 43.56 kg/m   Brief Summary:  Thomas [name] is a 72 y.o. male with obstructive sleep apnea, chronic respiratory failure and CTEPH.      Subjective:   He is here with his wife and daughter.  PFT still not completed.    His weight from 06/08/22 was 330 lbs.  He did CPAP titration study on 10/19/22.  Did best with CPAP 12 cm H2O and didn't need supplemental oxygen at this setting.  He is concerned that his exposure to Respironics CPAP machine is the cause of his health conditions.  He used to be very active and did karate prior to using the Respironics device.   Physical Exam:   Appearance - well kempt   ENMT - no sinus tenderness, no oral exudate, no LAN, Mallampati 3 airway, no stridor  Respiratory - equal breath sounds bilaterally, no wheezing or rales  CV - s1s2 regular rate and rhythm, no murmurs  Ext - no clubbing, no edema  Skin - no rashes  Psych - normal mood and affect   Pulmonary testing:  Spirometry 10/19/17 >> FEV1 2.5 (81%), FEV1% 91  Chest Imaging:  CT angio chest 11/23/15  >> PE Rt side CT angio chest 03/03/16 >> chronic PE on Rt CT angio chest 04/02/17 >> atherosclerosis CT angio chest 03/23/22 >> borderline LAN, no PE CT chest 06/15/22 >> LLL scarring, similar to smaller mediastinal LAN, fatty liver, Lt adrenal adenoma  Sleep Tests:  HST 09/08/17 >> AHI 77.9, SaO2 low 70% Bipap titration 09/18/17 >> Bipap 19/13 cm H2O >> AHI 8.5, didn't need supplemental oxygen. Bipap 11/14/17 to 12/13/17 >> used on 30 of 30 nights with average 8 hrs 25 min.  Average AHI 2.3 with Bipap 19/13 cm H2O PSG 06/28/22 >> AHI 107, SpO2 84%, Sleep time 37 min.  ONO with CPAP 08/17/22 >> test time 11 hr 29 min. Baseline SpO2 85%, low SpO2 77%. Spent 9 hrs 58 min with an SpO2 < 88%.  CPAP 10/13/22 >> CPAP 12 cm H2O >> AHI 0, didn't need supplemental oxygen.   Cardiac Tests:  Echo 06/02/17 >> EF 45 to 50%, PAS 35 mmHg RHC/LHC 07/06/17 >> non obstructive CAD, mod/severe pulmonary HTN Echo 05/13/22 >> EF 45 to 50%, mod LVH, grade 1 DD, mod elevation in PASP, mild/mod LA dilation, mod RA dilation  Social History:  He  reports that he has never smoked. He has never been exposed to tobacco smoke. He has never used smokeless tobacco. He reports that he does not drink alcohol and does not use drugs.  Family History:  His family history includes Breast cancer (age of onset: 63) in his sister; Breast cancer (age of onset: 40)  in his maternal aunt; Breast cancer (age of onset: 14) in his mother; Cancer (age of onset: 72) in his sister; Cancer (age of onset: 98) in his maternal uncle; Cervical cancer (age of onset: 37) in an other family member; Leukemia (age of onset: 57) in his brother; Lung cancer in his maternal uncle; Other (age of onset: 64) in his brother; Prostate cancer in his brother and paternal uncle.     Assessment/Plan:   Dyspnea on exertion. - likely combination of combined CHF, CTEPH and obesity with deconditioning - he is concerned possible exposure from his Respironics CPAP machine  could be contributing to his symptoms; he plans to contact a lawyer to pursue this further - will make sure his PFT gets scheduled  Obstructive sleep apnea. - he reports benefit from CPAP and compliance with therapy - will see if he can get set up with [facility] for his DME - current CPAP received in June 2024 - continue CPAP 12 cm H2O  Chronic respiratory failure with hypoxia. - in setting of obesity and CTEPH - goal SpO2 > 90% - using 2 liters with exertion - explained his O2 needs will likely improve as he continues to lose weight  Obesity. - he is on mounjaro for his diabetes   Chronic combined CHF. - followed by Dr. Everette Rank with cardiology - advised him to contact cardiology if his blood pressure readings remain low at home  CTEPH. - continue xarelto  Prostate cancer s/p suprapubic catheter. - followed by urology with [facility]  Time Spent Involved in Patient Care on Day of Examination:  28 minutes  Follow up:   Patient Instructions  Will make sure your pulmonary function test gets scheduled  High dose flu shot today  Follow up in 4 months  Medication List:   Allergies as of 11/06/2022       Reactions   Lisinopril Cough, Other (See Comments)   Muscle pain        Medication List        Accurate as of November 06, 2022  9:29 AM. If you have any questions, ask your nurse or doctor.          Accu-Chek Aviva Plus test strip Generic drug: glucose blood Use to test blood sugar daily.   Accu-Chek Aviva Soln Use once daily as directed dx E11.22   Accu-Chek Softclix Lancet Dev Kit Use to test blood sugar daily   acetaminophen 500 MG tablet Commonly known as: TYLENOL Take by mouth.   Aldactone 50 MG tablet Generic drug: spironolactone Take 50 mg by mouth daily.   amLODipine 10 MG tablet Commonly known as: NORVASC TAKE 1 TABLET EVERY DAY FOR HIGH BLOOD PRESSURE   aspirin EC 81 MG tablet Take 81 mg by mouth daily.   atorvastatin 40 MG  tablet Commonly known as: LIPITOR TAKE 1 TABLET EVERY DAY   B-D SINGLE USE SWABS REGULAR Pads Use in testing blood sugar daily. Dx: E11.22   carvedilol 12.5 MG tablet Commonly known as: COREG TAKE 1 TABLET TWICE DAILY WITH MEALS   empagliflozin 10 MG Tabs tablet Commonly known as: JARDIANCE Take 1 tablet (10 mg total) by mouth daily.   furosemide 20 MG tablet Commonly known as: LASIX Take 20 mg by mouth daily.   hydrALAZINE 25 MG tablet Commonly known as: APRESOLINE TAKE 3 TABLETS THREE TIMES DAILY   losartan 50 MG tablet Commonly known as: COZAAR TAKE 1 TABLET (50 MG TOTAL) BY MOUTH DAILY.   metFORMIN  500 MG tablet Commonly known as: GLUCOPHAGE TAKE 1 TABLET TWICE DAILY WITH MEALS   methocarbamol 500 MG tablet Commonly known as: ROBAXIN Take 1 tablet (500 mg total) by mouth every 8 (eight) hours as needed for muscle spasms.   Mounjaro 15 MG/0.5ML Pen Generic drug: tirzepatide Inject 15 mg into the skin once a week.   potassium chloride SA 20 MEQ tablet Commonly known as: KLOR-CON M TAKE 1 TABLET TWICE DAILY   rivaroxaban 20 MG Tabs tablet Commonly known as: XARELTO Take 1 tablet (20 mg total) by mouth daily with supper.   Senna 8.7 MG Chew Chew 1 tablet by mouth as needed.   senna-docusate 8.6-50 MG tablet Commonly known as: Senokot-S Take 1 tablet by mouth daily as needed.   SUPER GREENS PO Take 1 tablet by mouth 2 (two) times daily. Brand: Goli, gummy   traMADol 50 MG tablet Commonly known as: ULTRAM Take 1 tablet (50 mg total) by mouth every 6 (six) hours as needed for pain   True Metrix Air Glucose Meter w/Device Kit 1 Device by Does not apply route daily as needed. E11.22   TRUEplus Lancets 28G Misc 1 Device by Other route daily as needed. DX:E11.22   Accu-Chek Softclix Lancets lancets Use to test blood sugar daily   vitamin C 1000 MG tablet Take 2 tablets (2,000 mg total) by mouth daily.   Vitamin D3 50 MCG (2000 UT) Tabs Take 1 tablet  (2,000 Units) by mouth daily.   zinc gluconate 50 MG tablet Take 1 tablet (50 mg total) by mouth daily.        Signature:  Coralyn Helling, MD [facility] Pulmonary/Critical Care Pager - [phone] 11/06/2022, 9:29 AM

## 2022-11-07 LAB — SPECIMEN STATUS REPORT

## 2022-11-07 LAB — PRO B NATRIURETIC PEPTIDE: NT-Pro BNP: 659 pg/mL — ABNORMAL HIGH (ref 0–376)

## 2022-11-09 ENCOUNTER — Encounter: Payer: Medicare HMO | Admitting: Nurse Practitioner

## 2022-11-10 NOTE — Progress Notes (Signed)
This encounter was created in error - please disregard.

## 2022-11-11 ENCOUNTER — Ambulatory Visit: Payer: Self-pay

## 2022-11-11 DIAGNOSIS — Z9359 Other cystostomy status: Secondary | ICD-10-CM | POA: Diagnosis not present

## 2022-11-11 DIAGNOSIS — R339 Retention of urine, unspecified: Secondary | ICD-10-CM | POA: Diagnosis not present

## 2022-11-12 ENCOUNTER — Encounter: Payer: Self-pay | Admitting: Nurse Practitioner

## 2022-11-12 NOTE — Patient Instructions (Signed)
Visit Information  Thank you for taking time to visit with me today. Please don't hesitate to contact me if I can be of assistance to you.   Following are the goals we discussed today:   Goals Addressed             This Visit's Progress    COMPLETED: To have no further issues with Suprapubic Catheter       Care Coordination Interventions: Evaluation of current treatment plan related to impaired urinary elimination  and patient's adherence to plan as established by provider Determined patient completed a catheter irrigation this am per his Urologist Assessed for patient knowledge and understanding related to self health management of his suprapubic catheter and when to call the doctor if needed and patient verbalizes understanding      COMPLETED: To obtain a new CPAP machine       Care Coordination Interventions: Evaluation of current treatment plan related to Obstructive Sleep Apnea and patient's adherence to plan as established by provider Determined patient has received his new CPAP machine and is adhering to using it exactly as directed Instructed patient to keep his doctor well informed of new symptoms or concerns        Our next appointment is by telephone on 01/11/23 at 12:30 PM   Please call the care guide team at [phone] if you need to cancel or reschedule your appointment.   If you are experiencing a Mental Health or Behavioral Health Crisis or need someone to talk to, please call 1-800-273-TALK (toll free, 24 hour hotline)  Patient verbalizes understanding of instructions and care plan provided today and agrees to view in MyChart. Active MyChart status and patient understanding of how to access instructions and care plan via MyChart confirmed with patient.     [name] RN BSN CCM [place]  [facility], [facility] Nurse Care Coordinator  Direct Dial: [phone] Website: [email or web address]

## 2022-11-12 NOTE — Progress Notes (Signed)
   This service is provided via telemedicine  No vital signs collected/recorded due to the encounter was a telemedicine visit.   Location of patient (ex: home, work):  Home  Patient consents to a telephone visit: Yes  Location of the provider (ex: office, home):  [facility], Office   Name of any referring provider:  N/A  Names of all persons participating in the telemedicine service and their role in the encounter:  S.Chrae B/CMA, Abbey Chatters, NP, and Patient   Time spent on call:  13 min with medical assistant

## 2022-11-12 NOTE — Patient Outreach (Signed)
Care Coordination   Follow Up Visit Note   11/12/2022 Name: Thomas [name] MRN: [id number] DOB: [birth year]  Thomas [name] is a 72 y.o. year old male who sees [name], [name], NP for primary care. I spoke with  [name] by phone today.  What matters to the patients health and wellness today?  Patient would like to continue self managing his suprapubic catheter without difficulty or complications. He would like to continue wearing his CPAP as directed.     Goals Addressed             This Visit's Progress    COMPLETED: To have no further issues with Suprapubic Catheter       Care Coordination Interventions: Evaluation of current treatment plan related to impaired urinary elimination  and patient's adherence to plan as established by provider Determined patient completed a catheter irrigation this am per his Urologist Assessed for patient knowledge and understanding related to self health management of his suprapubic catheter and when to call the doctor if needed and patient verbalizes understanding      COMPLETED: To obtain a new CPAP machine       Care Coordination Interventions: Evaluation of current treatment plan related to Obstructive Sleep Apnea and patient's adherence to plan as established by provider Determined patient has received his new CPAP machine and is adhering to using it exactly as directed Instructed patient to keep his doctor well informed of new symptoms or concerns    Interventions Today    Flowsheet Row Most Recent Value  Chronic Disease   Chronic disease during today's visit Other  [OSA,  suprapubic catheter]  General Interventions   General Interventions Discussed/Reviewed General Interventions Discussed, General Interventions Reviewed, Doctor Visits, Durable Medical Equipment (DME)  Doctor Visits Discussed/Reviewed Doctor Visits Discussed, Doctor Visits Reviewed, Specialist  Durable Medical Equipment (DME) Other  [CPAP]   Education Interventions   Education Provided Provided Education  Provided Verbal Education On When to see the doctor           SDOH assessments and interventions completed:  No     Care Coordination Interventions:  Yes, provided   Follow up plan: Follow up call scheduled for 01/11/23 @12 :30 PM     Encounter Outcome:  Patient Visit Completed

## 2022-11-13 ENCOUNTER — Encounter: Payer: Self-pay | Admitting: Nurse Practitioner

## 2022-11-13 ENCOUNTER — Ambulatory Visit (INDEPENDENT_AMBULATORY_CARE_PROVIDER_SITE_OTHER): Payer: Medicare HMO | Admitting: Nurse Practitioner

## 2022-11-13 DIAGNOSIS — Z0000 Encounter for general adult medical examination without abnormal findings: Secondary | ICD-10-CM | POA: Diagnosis not present

## 2022-11-13 NOTE — Progress Notes (Signed)
Subjective:   Thomas [name] is a 72 y.o. male who presents for Medicare Annual/Subsequent preventive examination.  Visit Complete: Virtual  I connected with  Thomas [name] on 11/13/22 by a video and audio enabled telemedicine application and verified that I am speaking with the correct person using two identifiers.  Patient Location: Home  Provider Location: Home Office  I discussed the limitations of evaluation and management by telemedicine. The patient expressed understanding and agreed to proceed.    Cardiac Risk Factors include: advanced age (>55men, >49 women);diabetes mellitus;dyslipidemia;hypertension;sedentary lifestyle;male gender;obesity (BMI >30kg/m2)     Objective:    There were no vitals filed for this visit. There is no height or weight on file to calculate BMI.     11/12/2022    3:39 PM 10/13/2022   10:30 PM 06/29/2022   10:39 AM 03/23/2022    8:21 AM 11/18/2021    2:13 PM 10/30/2021   10:21 AM 09/29/2021    9:59 AM  Advanced Directives  Does Patient Have a Medical Advance Directive? No No No No No No Yes  Does patient want to make changes to medical advance directive?     No - Patient declined Yes (MAU/Ambulatory/Procedural Areas - Information given) Yes (MAU/Ambulatory/Procedural Areas - Information given)  Would patient like information on creating a medical advance directive? No - Patient declined No - Patient declined No - Patient declined  No - Patient declined      Current Medications (verified) Outpatient Encounter Medications as of 11/13/2022  Medication Sig   Accu-Chek Softclix Lancets lancets Use to test blood sugar daily   acetaminophen (TYLENOL) 500 MG tablet Take by mouth.   Alcohol Swabs (B-D SINGLE USE SWABS REGULAR) PADS Use in testing blood sugar daily. Dx: E11.22   amLODipine (NORVASC) 10 MG tablet TAKE 1 TABLET EVERY DAY FOR HIGH BLOOD PRESSURE   aspirin EC 81 MG tablet Take 81 mg by mouth daily.   atorvastatin (LIPITOR) 40 MG  tablet TAKE 1 TABLET EVERY DAY   Blood Glucose Calibration (ACCU-CHEK AVIVA) SOLN Use once daily as directed dx E11.22   Blood Glucose Monitoring Suppl (TRUE METRIX AIR GLUCOSE METER) w/Device KIT 1 Device by Does not apply route daily as needed. E11.22   carvedilol (COREG) 12.5 MG tablet TAKE 1 TABLET TWICE DAILY WITH MEALS   Cholecalciferol (VITAMIN D3) 50 MCG (2000 UT) TABS Take 1 tablet (2,000 Units) by mouth daily.   empagliflozin (JARDIANCE) 10 MG TABS tablet Take 1 tablet (10 mg total) by mouth daily.   furosemide (LASIX) 20 MG tablet Take 20 mg by mouth daily.   glucose blood (ACCU-CHEK AVIVA PLUS) test strip Use to test blood sugar daily.   hydrALAZINE (APRESOLINE) 25 MG tablet TAKE 3 TABLETS THREE TIMES DAILY   Lancets Misc. (ACCU-CHEK SOFTCLIX LANCET DEV) KIT Use to test blood sugar daily   losartan (COZAAR) 50 MG tablet TAKE 1 TABLET (50 MG TOTAL) BY MOUTH DAILY.   metFORMIN (GLUCOPHAGE) 500 MG tablet TAKE 1 TABLET TWICE DAILY WITH MEALS   potassium chloride SA (KLOR-CON M) 20 MEQ tablet TAKE 1 TABLET TWICE DAILY   rivaroxaban (XARELTO) 20 MG TABS tablet Take 1 tablet (20 mg total) by mouth daily with supper.   Senna 8.7 MG CHEW Chew 1 tablet by mouth as needed.   spironolactone (ALDACTONE) 50 MG tablet Take 50 mg by mouth daily.   tirzepatide (MOUNJARO) 15 MG/0.5ML Pen Inject 15 mg into the skin once a week.   traMADol (ULTRAM) 50  MG tablet Take 1 tablet (50 mg total) by mouth every 6 (six) hours as needed for pain   TRUEplus Lancets 28G MISC 1 Device by Other route daily as needed. DX:E11.22   zinc gluconate 50 MG tablet Take 1 tablet (50 mg total) by mouth daily.   [DISCONTINUED] Ascorbic Acid (VITAMIN C) 1000 MG tablet Take 2 tablets (2,000 mg total) by mouth daily.   [DISCONTINUED] methocarbamol (ROBAXIN) 500 MG tablet Take 1 tablet (500 mg total) by mouth every 8 (eight) hours as needed for muscle spasms. (Patient not taking: Reported on 11/13/2022)   [DISCONTINUED] Misc  Natural Products (SUPER GREENS PO) Take 1 tablet by mouth 2 (two) times daily. Brand: Goli, gummy (Patient not taking: Reported on 11/06/2022)   [DISCONTINUED] senna-docusate (SENOKOT-S) 8.6-50 MG tablet Take 1 tablet by mouth daily as needed. (Patient not taking: Reported on 11/06/2022)   No facility-administered encounter medications on file as of 11/13/2022.    Allergies (verified) Lisinopril   History: Past Medical History:  Diagnosis Date   Acute on chronic diastolic CHF (congestive heart failure) (HCC) 06/01/2017   Arthritis    Cancer (HCC) 2010   Prostate   Chronic kidney disease    Diabetes mellitus    Diabetic neuropathy (HCC)    feet   DVT (deep venous thrombosis) (HCC)    Genetic testing 06/22/2016   Mr. Lesser underwent genetic counseling and testing for hereditary cancer syndromes on 05/14/2016. His results were negative for mutations in all 46 genes analyzed by [facility]'s 46-gene Common Hereditary Cancers Panel. Genes analyzed include: APC, ATM, AXIN2, BARD1, BMPR1A, BRCA1, BRCA2, BRIP1, CDH1, CDKN2A, CHEK2, CTNNA1, DICER1, EPCAM, GREM1, HOXB13, KIT, MEN1, MLH1, MSH2, MSH3, MSH6, MUTYH, NB   GERD (gastroesophageal reflux disease)    Hypertension    PE (pulmonary thromboembolism) (HCC)    Pneumonia    Sleep apnea    not wearing CPAP   Suprapubic catheter (HCC) 11/17/2019   Past Surgical History:  Procedure Laterality Date   CARDIOVERSION N/A 06/03/2017   Procedure: CARDIOVERSION;  Surgeon: Jake Bathe, MD;  Location: [facility] ENDOSCOPY;  Service: Cardiovascular;  Laterality: N/A;   CATARACT EXTRACTION Left 07/31/2021   Dr.Glenn, [facility]   COLONOSCOPY     COLONOSCOPY WITH PROPOFOL N/A 10/20/2018   Procedure: COLONOSCOPY WITH PROPOFOL;  Surgeon: Tressia Danas, MD;  Location: [facility] ENDOSCOPY;  Service: Gastroenterology;  Laterality: N/A;   HERNIA REPAIR     KNEE SURGERY     MULTIPLE TOOTH EXTRACTIONS     POLYPECTOMY  10/20/2018   Procedure: POLYPECTOMY;   Surgeon: Tressia Danas, MD;  Location: [facility] ENDOSCOPY;  Service: Gastroenterology;;   PROSTATE SURGERY     RADIOACTIVE SEED IMPLANT     RIGHT/LEFT HEART CATH AND CORONARY ANGIOGRAPHY N/A 07/06/2017   Procedure: RIGHT/LEFT HEART CATH AND CORONARY ANGIOGRAPHY;  Surgeon: Corky Crafts, MD;  Location: [facility] INVASIVE CV LAB;  Service: Cardiovascular;  Laterality: N/A;   SHOULDER SURGERY     TOTAL KNEE ARTHROPLASTY Right 11/19/2016   Procedure: RIGHT TOTAL KNEE ARTHROPLASTY;  Surgeon: Tarry Kos, MD;  Location: [facility] OR;  Service: Orthopedics;  Laterality: Right;   uretha surgery-2014     Family History  Problem Relation Age of Onset   Breast cancer Mother 46       d.89   Breast cancer Sister 30       d.30   Leukemia Brother 18       d.20   Breast cancer Maternal Aunt 40  d.40s   Lung cancer Maternal Uncle    Prostate cancer Paternal Uncle    Prostate cancer Brother        recurred recently at age 7   Other Brother 15       spinal tumor   Cervical cancer Other 22       d.22   Cancer Sister 54       unspecified type   Cancer Maternal Uncle 58       unspecified type   Social History   Socioeconomic History   Marital status: Married    Spouse name: Not on file   Number of children: Not on file   Years of education: Not on file   Highest education level: Not on file  Occupational History   Not on file  Tobacco Use   Smoking status: Never    Passive exposure: Never   Smokeless tobacco: Never  Vaping Use   Vaping status: Never Used  Substance and Sexual Activity   Alcohol use: No    Comment: never   Drug use: No   Sexual activity: Yes  Other Topics Concern   Not on file  Social History Narrative   Not on file   Social Determinants of Health   Financial Resource Strain: Medium Risk (12/09/2017)   Overall Financial Resource Strain (CARDIA)    Difficulty of Paying Living Expenses: Somewhat hard  Food Insecurity: Unknown (05/25/2022)   Hunger Vital Sign     Worried About Running Out of Food in the Last Year: Never true    Ran Out of Food in the Last Year: Not on file  Transportation Needs: No Transportation Needs (12/09/2017)   PRAPARE - Administrator, Civil Service (Medical): No    Lack of Transportation (Non-Medical): No  Physical Activity: Inactive (12/09/2017)   Exercise Vital Sign    Days of Exercise per Week: 0 days    Minutes of Exercise per Session: 0 min  Stress: No Stress Concern Present (12/09/2017)   [employer] of Occupational Health - Occupational Stress Questionnaire    Feeling of Stress : Only a little  Social Connections: Somewhat Isolated (12/09/2017)   Social Connection and Isolation Panel [NHANES]    Frequency of Communication with Friends and Family: More than three times a week    Frequency of Social Gatherings with Friends and Family: More than three times a week    Attends Religious Services: Never    Database administrator or Organizations: No    Attends Engineer, structural: Never    Marital Status: Married    Tobacco Counseling Counseling given: Not Answered   Clinical Intake:  Pre-visit preparation completed: Yes  Pain : No/denies pain     BMI - recorded: 43 Nutritional Status: BMI > 30  Obese Nutritional Risks: None Diabetes: No  How often do you need to have someone help you when you read instructions, pamphlets, or other written materials from your doctor or pharmacy?: 1 - Never         Activities of Daily Living    11/13/2022    3:11 PM  In your present state of health, do you have any difficulty performing the following activities:  Hearing? 1  Vision? 1  Difficulty concentrating or making decisions? 0  Walking or climbing stairs? 1  Dressing or bathing? 0  Doing errands, shopping? 1  Preparing Food and eating ? N  Using the Toilet? N  In the past six months,  have you accidently leaked urine? Y  Do you have problems with loss of bowel control? N   Managing your Medications? N  Managing your Finances? N  Housekeeping or managing your Housekeeping? N    Patient Care Team: Sharon Seller, NP as PCP - General (Geriatric Medicine) Christell Constant, MD as PCP - Cardiology (Cardiology) Clarene Duke Karma Lew, RN as [facility] Care Management [place], Enrique Sack as [facility] Care Management Alben Spittle, Aida Raider as Physician Assistant (Cardiology) Shon Millet, MD as Consulting Physician (Ophthalmology)  Indicate any recent Medical Services you may have received from other than [facility] providers in the past year (date may be approximate).     Assessment:   This is a routine wellness examination for Thomas [name].  Hearing/Vision screen Hearing Screening - Comments:: Patient admits to slight hearing lose, not interested in hearing aids  Vision Screening - Comments:: Last eye exam less than 12 months ago with Dr.Glenn   Goals Addressed   None   Depression Screen    11/13/2022    9:44 AM 11/18/2021    2:13 PM 10/30/2021   10:44 AM 05/19/2021    9:57 AM 01/06/2021    8:06 AM 10/24/2020   10:48 AM 05/11/2019    9:35 AM  PHQ 2/9 Scores  PHQ - 2 Score 0 0 0 0 0 0 0  PHQ- 9 Score  0         Fall Risk    11/13/2022    9:44 AM 11/18/2021    2:13 PM 10/30/2021   10:44 AM 09/29/2021    9:54 AM 08/18/2021    9:16 AM  Fall Risk   Falls in the past year? 0 0 0 0 0  Number falls in past yr: 0 0 0 0 0  Injury with Fall? 0 0 0 0 0  Risk for fall due to : No Fall Risks History of fall(s) No Fall Risks No Fall Risks No Fall Risks  Follow up Falls evaluation completed Falls evaluation completed Falls evaluation completed Falls evaluation completed Falls evaluation completed    MEDICARE RISK AT HOME:    TIMED UP AND GO:  Was the test performed?  No    Cognitive Function:    12/09/2017   10:30 AM 05/21/2016    9:37 AM  MMSE - Mini Mental State Exam  Orientation to time 4 5  Orientation to Place 5 5   Registration 3 3  Attention/ Calculation 5 4  Recall 2 2  Language- name 2 objects 2 2  Language- repeat 1 1  Language- follow 3 step command 3 3  Language- read & follow direction 1 1  Write a sentence 1 1  Copy design 1 1  Total score 28 28        11/13/2022    9:49 AM 10/30/2021   10:47 AM 10/24/2020   10:51 AM  6CIT Screen  What Year? 0 points 0 points 0 points  What month? 0 points 0 points 0 points  What time? 0 points 0 points 0 points  Count back from 20 0 points 0 points 0 points  Months in reverse 0 points 0 points 0 points  Repeat phrase 0 points 2 points 10 points  Total Score 0 points 2 points 10 points    Immunizations Immunization History  Administered Date(s) Administered   Fluad Quad(high Dose 65+) 12/09/2018, 03/15/2020, 01/06/2021   Fluad Trivalent(High Dose 65+) 11/06/2022   Influenza, High Dose Seasonal PF  12/14/2016   Influenza,inj,Quad PF,6+ Mos 11/22/2013, 12/05/2014, 10/24/2015   Influenza-Unspecified 11/29/2017   PFIZER(Purple Top)SARS-COV-2 Vaccination 07/29/2019, 08/08/2019, 08/19/2019, 01/25/2020   Pneumococcal Conjugate-13 05/23/2014   Pneumococcal Polysaccharide-23 12/13/2012, 05/21/2016    TDAP status: Up to date  Flu Vaccine status: Up to date  Pneumococcal vaccine status: Up to date  Covid-19 vaccine status: Information provided on how to obtain vaccines.   Qualifies for Shingles Vaccine? Yes   Zostavax completed No   Shingrix Completed?: No.    Education has been provided regarding the importance of this vaccine. Patient has been advised to call insurance company to determine out of pocket expense if they have not yet received this vaccine. Advised may also receive vaccine at local pharmacy or Health Dept. Verbalized acceptance and understanding.  Screening Tests Health Maintenance  Topic Date Due   DTaP/Tdap/Td (1 - Tdap) Never done   Zoster Vaccines- Shingrix (1 of 2) Never done   OPHTHALMOLOGY EXAM  01/23/2021   COVID-19  Vaccine (5 - 2023-24 season) 10/18/2022   FOOT EXAM  03/31/2023   HEMOGLOBIN A1C  05/03/2023   Diabetic kidney evaluation - Urine ACR  06/29/2023   Diabetic kidney evaluation - eGFR measurement  11/03/2023   Medicare Annual Wellness (AWV)  11/13/2023   Colonoscopy  10/19/2028   Pneumonia Vaccine 24+ Years old  Completed   INFLUENZA VACCINE  Completed   Hepatitis C Screening  Completed   HPV VACCINES  Aged Out    Health Maintenance  Health Maintenance Due  Topic Date Due   DTaP/Tdap/Td (1 - Tdap) Never done   Zoster Vaccines- Shingrix (1 of 2) Never done   OPHTHALMOLOGY EXAM  01/23/2021   COVID-19 Vaccine (5 - 2023-24 season) 10/18/2022    Colorectal cancer screening: Type of screening: Colonoscopy. Completed 10/26/2018. Repeat every 10 years  Lung Cancer Screening: (Low Dose CT Chest recommended if Age 78-80 years, 20 pack-year currently smoking OR have quit w/in 15years.) does not qualify.   Lung Cancer Screening Referral: na  Additional Screening:  Hepatitis C Screening: does qualify; Completed   Vision Screening: Recommended annual ophthalmology exams for early detection of glaucoma and other disorders of the eye. Is the patient up to date with their annual eye exam?  Yes  Who is the provider or what is the name of the office in which the patient attends annual eye exams? Dr Sherrine Maples If pt is not established with a provider, would they like to be referred to a provider to establish care? No .   Dental Screening: Recommended annual dental exams for proper oral hygiene  Diabetic Foot Exam: Diabetic Foot Exam: Completed 03/30/2022  Community Resource Referral / Chronic Care Management: CRR required this visit?  No   CCM required this visit?  No     Plan:     I have personally reviewed and noted the following in the patient's chart:   Medical and social history Use of alcohol, tobacco or illicit drugs  Current medications and supplements including opioid  prescriptions. Patient is not currently taking opioid prescriptions. Functional ability and status Nutritional status Physical activity Advanced directives List of other physicians Hospitalizations, surgeries, and ER visits in previous 12 months Vitals Screenings to include cognitive, depression, and falls Referrals and appointments  In addition, I have reviewed and discussed with patient certain preventive protocols, quality metrics, and best practice recommendations. A written personalized care plan for preventive services as well as general preventive health recommendations were provided to patient.  Sharon Seller, NP   11/13/2022   After Visit Summary: (MyChart) Due to this being a telephonic visit, the after visit summary with patients personalized plan was offered to patient via MyChart

## 2022-11-13 NOTE — Patient Instructions (Signed)
  Thomas [name] , Thank you for taking time to come for your Medicare Wellness Visit. I appreciate your ongoing commitment to your health goals. Please review the following plan we discussed and let me know if I can assist you in the future.     This is a list of the screening recommended for you and due dates:  Health Maintenance  Topic Date Due   DTaP/Tdap/Td vaccine (1 - Tdap) Never done   Zoster (Shingles) Vaccine (1 of 2) Never done   Eye exam for diabetics  01/23/2021   COVID-19 Vaccine (5 - 2023-24 season) 10/18/2022   Complete foot exam   03/31/2023   Hemoglobin A1C  05/03/2023   Yearly kidney health urinalysis for diabetes  06/29/2023   Yearly kidney function blood test for diabetes  11/03/2023   Medicare Annual Wellness Visit  11/13/2023   Colon Cancer Screening  10/19/2028   Pneumonia Vaccine  Completed   Flu Shot  Completed   Hepatitis C Screening  Completed   HPV Vaccine  Aged Out   To get TDAP, COVID booster and shingles vaccine at your local pharmacy

## 2022-11-17 ENCOUNTER — Encounter: Payer: Self-pay | Admitting: *Deleted

## 2022-11-17 ENCOUNTER — Ambulatory Visit: Payer: Self-pay

## 2022-11-17 DIAGNOSIS — R339 Retention of urine, unspecified: Secondary | ICD-10-CM | POA: Diagnosis not present

## 2022-11-17 NOTE — Patient Instructions (Signed)
Visit Information  Thank you for taking time to visit with me today. Please don't hesitate to contact me if I can be of assistance to you.   Following are the goals we discussed today:   Goals Addressed             This Visit's Progress    Care Coordination Activities       Care Coordination Interventions: Determined that the patient  has not contacted [facility] regarding the wheelchair ramp. SW provided verbal and written education. SW discussed with patient on how to contact [place] [employer] as desired.          Our next appointment is by telephone on 12/08/2022 at 12:30 pm  Please call the care guide team at [phone] if you need to cancel or reschedule your appointment.   If you are experiencing a Mental Health or Behavioral Health Crisis or need someone to talk to, please call 1-800-273-TALK (toll free, 24 hour hotline) go to [facility] [address] [place] [phone])  Patient verbalizes understanding of instructions and care plan provided today and agrees to view in MyChart. Active MyChart status and patient understanding of how to access instructions and care plan via MyChart confirmed with patient.     [name], [name], CDP [facility]  [facility], [facility] Social Worker Direct Dial: [phone]  Fax: [phone]

## 2022-11-17 NOTE — Telephone Encounter (Signed)
Msg to pt sent via mychart with these results

## 2022-11-17 NOTE — Patient Outreach (Signed)
Care Coordination   Follow Up Visit Note   11/17/2022 Name: Thomas [name] MRN: [id number] DOB: [birth year]  ERIKSEN SABATELLI is a 72 y.o. year old male who sees [name], [name], NP for primary care. I spoke with  [name] by phone today.  What matters to the patients health and wellness today?  Patient needs a wheel chair ramp    Goals Addressed             This Visit's Progress    Care Coordination Activities       Care Coordination Interventions: Determined that the patient  has not contacted [facility] regarding the wheelchair ramp. SW provided verbal and written education. SW discussed with patient on how to contact [place] [employer] as desired.          SDOH assessments and interventions completed:  No     Care Coordination Interventions:  Yes, provided  Interventions Today    Flowsheet Row Most Recent Value  General Interventions   General Interventions Discussed/Reviewed General Interventions Discussed, Community Resources  Education Interventions   Education Provided Provided Printed Education  [SW will mail information on [employer] [facility] for the ramp]        Follow up plan: Follow up call scheduled for 12/08/2022 at 12:30 pm    Encounter Outcome:  Patient Visit Completed   [name], [name], CDP [facility]  [facility], [facility] Social Worker Direct Dial: [phone]  Fax: [phone]

## 2022-12-01 ENCOUNTER — Other Ambulatory Visit (HOSPITAL_COMMUNITY): Payer: Self-pay

## 2022-12-01 ENCOUNTER — Other Ambulatory Visit: Payer: Self-pay | Admitting: Nurse Practitioner

## 2022-12-01 DIAGNOSIS — M170 Bilateral primary osteoarthritis of knee: Secondary | ICD-10-CM

## 2022-12-01 MED ORDER — TRAMADOL HCL 50 MG PO TABS
50.0000 mg | ORAL_TABLET | Freq: Four times a day (QID) | ORAL | 0 refills | Status: DC | PRN
Start: 2022-12-01 — End: 2023-01-20
  Filled 2022-12-01: qty 30, 8d supply, fill #0

## 2022-12-01 NOTE — Telephone Encounter (Signed)
Patient only given 30 day supply on last Tramadol refill. Medication pend and sent to PCP Janyth Contes [name], NP for approval.

## 2022-12-02 ENCOUNTER — Other Ambulatory Visit: Payer: Self-pay | Admitting: Family

## 2022-12-02 DIAGNOSIS — I10 Essential (primary) hypertension: Secondary | ICD-10-CM

## 2022-12-02 NOTE — Telephone Encounter (Signed)
High Risk Warning Populated when attempting to refill, I will send to Provider for further review 

## 2022-12-08 ENCOUNTER — Ambulatory Visit: Payer: Self-pay

## 2022-12-08 NOTE — Patient Outreach (Signed)
Care Coordination   Follow Up Visit Note   12/08/2022 Name: Thomas [name] MRN: [id number] DOB: [birth year]  Thomas [name] is a 72 y.o. year old male who sees [name], [name], NP for primary care. I spoke with  Thomas [name] by phone today.  What matters to the patients health and wellness today?  SW outreached the patient to assess goal progression. The patient reports his wife has contacted [facility] [employer] regarding a ramp. The patient declines further SW involvement at this time.    Goals Addressed             This Visit's Progress    COMPLETED: Care Coordination Activities       Care Coordination Interventions: Determined that the patients spouse has been in contact regarding a wheelchair ramp No other SW needs identified at this time; please contact me as needed         SDOH assessments and interventions completed:  No     Care Coordination Interventions:  Yes, provided   Interventions Today    Flowsheet Row Most Recent Value  General Interventions   General Interventions Discussed/Reviewed General Interventions Reviewed, [employer]  [Discussed the patients spouse is in contact with [facility] re: ramp. Encouraged pt to contact SW as needed]        Follow up plan: No further intervention required.   Encounter Outcome:  Patient Visit Completed   [name], [name], CDP [facility]  [facility], [facility] Social Worker Direct Dial: [phone]  Fax: [phone]

## 2022-12-08 NOTE — Patient Instructions (Signed)
Visit Information  Thank you for taking time to visit with me today. Please don't hesitate to contact me if I can be of assistance to you.   Following are the goals we discussed today:   Goals Addressed             This Visit's Progress    COMPLETED: Care Coordination Activities       Care Coordination Interventions: Determined that the patients spouse has been in contact regarding a wheelchair ramp No other SW needs identified at this time; please contact me as needed         If you are experiencing a Mental Health or Behavioral Health Crisis or need someone to talk to, please call 1-800-273-TALK (toll free, 24 hour hotline) go to [facility] [address], [place] [phone]) call 911  Patient verbalizes understanding of instructions and care plan provided today and agrees to view in MyChart. Active MyChart status and patient understanding of how to access instructions and care plan via MyChart confirmed with patient.     No further follow up required: Please contact your primary care provider as needed.  [name], [name], CDP [facility]  [facility], [facility] Social Worker Direct Dial: [phone]  Fax: [phone]

## 2022-12-09 DIAGNOSIS — R339 Retention of urine, unspecified: Secondary | ICD-10-CM | POA: Diagnosis not present

## 2022-12-09 DIAGNOSIS — Z9359 Other cystostomy status: Secondary | ICD-10-CM | POA: Diagnosis not present

## 2022-12-09 DIAGNOSIS — Z466 Encounter for fitting and adjustment of urinary device: Secondary | ICD-10-CM | POA: Diagnosis not present

## 2022-12-10 ENCOUNTER — Other Ambulatory Visit (HOSPITAL_COMMUNITY): Payer: Self-pay

## 2022-12-13 ENCOUNTER — Other Ambulatory Visit: Payer: Self-pay | Admitting: Interventional Cardiology

## 2022-12-13 ENCOUNTER — Other Ambulatory Visit: Payer: Self-pay | Admitting: Nurse Practitioner

## 2022-12-16 DIAGNOSIS — R339 Retention of urine, unspecified: Secondary | ICD-10-CM | POA: Diagnosis not present

## 2022-12-31 ENCOUNTER — Other Ambulatory Visit: Payer: Self-pay | Admitting: Nurse Practitioner

## 2022-12-31 DIAGNOSIS — E1122 Type 2 diabetes mellitus with diabetic chronic kidney disease: Secondary | ICD-10-CM

## 2022-12-31 DIAGNOSIS — I10 Essential (primary) hypertension: Secondary | ICD-10-CM

## 2023-01-06 ENCOUNTER — Other Ambulatory Visit: Payer: Self-pay | Admitting: Nurse Practitioner

## 2023-01-06 DIAGNOSIS — Z466 Encounter for fitting and adjustment of urinary device: Secondary | ICD-10-CM | POA: Diagnosis not present

## 2023-01-06 DIAGNOSIS — Z9359 Other cystostomy status: Secondary | ICD-10-CM | POA: Diagnosis not present

## 2023-01-06 DIAGNOSIS — R339 Retention of urine, unspecified: Secondary | ICD-10-CM | POA: Diagnosis not present

## 2023-01-06 DIAGNOSIS — I5022 Chronic systolic (congestive) heart failure: Secondary | ICD-10-CM

## 2023-01-06 NOTE — Telephone Encounter (Signed)
High risk or very high risk warning populated when attempting to refill medication. RX request sent to PCP for review and approval if warranted.

## 2023-01-11 ENCOUNTER — Telehealth: Payer: Self-pay | Admitting: Nurse Practitioner

## 2023-01-11 ENCOUNTER — Ambulatory Visit: Payer: Self-pay

## 2023-01-11 DIAGNOSIS — G4730 Sleep apnea, unspecified: Secondary | ICD-10-CM

## 2023-01-11 NOTE — Telephone Encounter (Signed)
Referral placed.

## 2023-01-11 NOTE — Telephone Encounter (Signed)
Referral faxed to   [facility] Pulmon[facility] - [facility] [address]  [address], [place] [address]  [phone] Fax [phone]

## 2023-01-11 NOTE — Telephone Encounter (Signed)
Patient's wife called and would like patient's pulmonary referral to go to Dr. Coralyn Helling ([name] ph: [phone], fax: [phone]. Attention: Toniann Fail). [name] used to be with [facility] but now he is with [facility], and patient would like to remain with [name].

## 2023-01-12 ENCOUNTER — Ambulatory Visit: Payer: Self-pay

## 2023-01-12 NOTE — Patient Instructions (Signed)
Visit Information  Thank you for taking time to visit with me today. Please don't hesitate to contact me if I can be of assistance to you.   Following are the goals we discussed today:   Goals Addressed             This Visit's Progress    To improve shortness of breath       Care Coordination Interventions: Evaluation of current treatment plan related to dyspnea on exertion  and patient's adherence to plan as established by provider Determined patient continues to experience shortness of breath with increased activity and oxygen desaturations <90%, he is wearing his oxygen continuously as directed by Pulmonology Determined patient has not been scheduled to have his PFT per recommendation of [name], Pulmonologist Determined patient does not have the extended tubing for his concentrator and therefore removing his oxygen when he needs to move within his home  Discussed current Pulmonology provider, [name] has transitioned to [facility], patient will continue to have this doctor manage his lung condition, his wife will call [facility] to schedule his follow up appointment  Patient will obtain his oxygen tubing from [facility] in order to wear his oxygen continuously as directed  Patient will schedule a follow up with [name], he will also alert the office he needs PFT completed per [name] Patient will maintain his oxygen saturations >90% and or notify his lung doctor of new or worsening symptoms Patient will continue to work with nurse care coordinator for chronic disease management and care coordination needs        Our next appointment is by telephone on 01/25/23 at 09:30 AM  Please call the care guide team at [phone] if you need to cancel or reschedule your appointment.   If you are experiencing a Mental Health or Behavioral Health Crisis or need someone to talk to, please call 1-800-273-TALK (toll free, 24 hour hotline)  Patient verbalizes understanding of instructions  and care plan provided today and agrees to view in MyChart. Active MyChart status and patient understanding of how to access instructions and care plan via MyChart confirmed with patient.     [name] RN BSN CCM [place]  [facility], [facility] Health Nurse Care Coordinator  Direct Dial: [phone] Website: [email or web address]

## 2023-01-12 NOTE — Patient Outreach (Signed)
  Care Coordination   Follow Up Visit Note   01/12/2023 Name: Thomas [name] MRN: [id number] DOB: 1950/10/07  TERRYL TUBB is a 72 y.o. year old male who sees [name], [name], NP for primary care. I spoke with  [name] by phone today.  What matters to the patients health and wellness today?  Patient would like to improve his shortness of breath. He would like assistance with obtaining extended oxygen tubing for his concentrator.     Goals Addressed             This Visit's Progress    To improve shortness of breath       Care Coordination Interventions: Evaluation of current treatment plan related to dyspnea on exertion  and patient's adherence to plan as established by provider Determined patient continues to experience shortness of breath with increased activity and oxygen desaturations <90%, he is wearing his oxygen continuously as directed by Pulmonology Determined patient has not been scheduled to have his PFT per recommendation of [name], Pulmonologist Determined patient does not have the extended tubing for his concentrator and therefore removing his oxygen when he needs to move within his home  Discussed current Pulmonology provider, [name] has transitioned to [facility], patient will continue to have this doctor manage his lung condition, his wife will call [facility] to schedule his follow up appointment  Patient will obtain his oxygen tubing from [facility] in order to wear his oxygen continuously as directed  Patient will schedule a follow up with [name], he will also alert the office he needs PFT completed per [name] Patient will maintain his oxygen saturations >90% and or notify his lung doctor of new or worsening symptoms Patient will continue to work with nurse care coordinator for chronic disease management and care coordination needs    Interventions Today    Flowsheet Row Most Recent Value  Chronic Disease   Chronic disease  during today's visit Other  [DOE,  OSA w/CPAP]  General Interventions   General Interventions Discussed/Reviewed General Interventions Discussed, General Interventions Reviewed, Doctor Visits, Durable Medical Equipment (DME), Communication with  Doctor Visits Discussed/Reviewed Doctor Visits Discussed, Doctor Visits Reviewed, Specialist  Durable Medical Equipment (DME) Oxygen, Other  [CPAP]  Communication with Social Work  Remigio Eisenmenger [name],  Abbey Chatters NP,  [facility]]  Exercise Interventions   Exercise Discussed/Reviewed Physical Activity  Physical Activity Discussed/Reviewed Physical Activity Reviewed, Physical Activity Discussed  Education Interventions   Education Provided Provided Education  Provided Verbal Education On When to see the doctor  Pharmacy Interventions   Pharmacy Dicussed/Reviewed Pharmacy Topics Reviewed, Pharmacy Topics Discussed, Affording Medications          SDOH assessments and interventions completed:  Yes  SDOH Interventions Today    Flowsheet Row Most Recent Value  SDOH Interventions   Food Insecurity Interventions AMB Referral  [sent referral to Remigio Eisenmenger [name]]  Housing Interventions Intervention Not Indicated  Transportation Interventions Intervention Not Indicated  Utilities Interventions AMB Referral  [SW referral sent to Remigio Eisenmenger [name] to assist with resources for utilities]        Care Coordination Interventions:  Yes, provided   Follow up plan: Referral made to Remigio Eisenmenger [name] to provide resources for legal aid and food insecurities and difficulty paying utilities Follow up call scheduled for 01/25/23 @09 :30 AM    Encounter Outcome:  Patient Visit Completed

## 2023-01-12 NOTE — Patient Outreach (Signed)
  Care Coordination   Follow Up Visit Note   01/12/2023 Name: Thomas [name] MRN: [id number] DOB: 1951/01/17  Thomas [name] is a 72 y.o. year old male who sees [name], [name], NP for primary care. I spoke with  [name] by phone today.  What matters to the patients health and wellness today?  Patient would like to obtain extended oxygen tubing for his concentrator. He would like to learn more about his heart and lung conditions.     Goals Addressed             This Visit's Progress    To improve shortness of breath       Care Coordination Interventions: Evaluation of current treatment plan related to dyspnea on exertion  and patient's adherence to plan as established by provider Placed outbound joint call to [facility] with patient regarding oxygen tubing Determined patient turned in his equipment in 10/24 and is using his wife's oxygen concentrator  Determined patient will need a written Rx from PCP for new tubing, he can pay out of pocket and pick up his tubing in person at the [facility] location, patient verbalizes understanding Sent in basket message to PCP requesting an Rx for 2- nasal cannulas and tubing extension for patient to pick up in person Reviewed previous Pulmonology note with patient in order to review MD recommendations Educated patient about basic disease process related to Pulmonary Hypertension, educated patient about pulmonary and cardiac rehab and encouraged patient to discuss these therapies with his specialists during next scheduled visits Encouraged patient to make a list of questions for both his lung and heart doctor to help guide him on how to approach his concerns and questions during his next scheduled visits  Patient will obtain his oxygen tubing from [facility] in order to wear his oxygen continuously as directed  Patient will schedule a follow up with [name], he will also alert the office he needs PFT completed per Dr.  Craige Cotta Patient will maintain his oxygen saturations >90% and or notify his lung doctor of new or worsening symptoms Patient will keep all scheduled MD follow up appointments as directed  Patient will continue to work with nurse care coordinator for chronic disease management and care coordination needs    Interventions Today    Flowsheet Row Most Recent Value  Chronic Disease   Chronic disease during today's visit Other  [DOE,  OSA w/CPAP]  General Interventions   General Interventions Discussed/Reviewed General Interventions Discussed, General Interventions Reviewed, Durable Medical Equipment (DME), Doctor Visits, Communication with  Doctor Visits Discussed/Reviewed Doctor Visits Discussed, Doctor Visits Reviewed, Specialist  Durable Medical Equipment (DME) Oxygen  Communication with --  [facility]]  Education Interventions   Education Provided Provided Education  Provided Verbal Education On When to see the doctor, Medication  Pharmacy Interventions   Pharmacy Dicussed/Reviewed Pharmacy Topics Reviewed, Pharmacy Topics Discussed          SDOH assessments and interventions completed:  No     Care Coordination Interventions:  Yes, provided   Follow up plan: Follow up call scheduled for 01/25/23 @09 :30 AM    Encounter Outcome:  Patient Visit Completed

## 2023-01-12 NOTE — Patient Instructions (Signed)
Visit Information  Thank you for taking time to visit with me today. Please don't hesitate to contact me if I can be of assistance to you.   Following are the goals we discussed today:   Goals Addressed             This Visit's Progress    To improve shortness of breath       Care Coordination Interventions: Evaluation of current treatment plan related to dyspnea on exertion  and patient's adherence to plan as established by provider Placed outbound joint call to [facility] with patient regarding oxygen tubing Determined patient turned in his equipment in 10/24 and is using his wife's oxygen concentrator  Determined patient will need a written Rx from PCP for new tubing, he can pay out of pocket and pick up his tubing in person at the [facility] location, patient verbalizes understanding Sent in basket message to PCP requesting an Rx for 2- nasal cannulas and tubing extension for patient to pick up in person Reviewed previous Pulmonology note with patient in order to review MD recommendations Educated patient about basic disease process related to Pulmonary Hypertension, educated patient about pulmonary and cardiac rehab and encouraged patient to discuss these therapies with his specialists during next scheduled visits Encouraged patient to make a list of questions for both his lung and heart doctor to help guide him on how to approach his concerns and questions during his next scheduled visits  Patient will obtain his oxygen tubing from [facility] in order to wear his oxygen continuously as directed  Patient will schedule a follow up with [name], he will also alert the office he needs PFT completed per [name] Patient will maintain his oxygen saturations >90% and or notify his lung doctor of new or worsening symptoms Patient will keep all scheduled MD follow up appointments as directed  Patient will continue to work with nurse care coordinator for chronic disease management and care  coordination needs        Our next appointment is by telephone on 01/25/23 at 09:30 AM  Please call the care guide team at [phone] if you need to cancel or reschedule your appointment.   If you are experiencing a Mental Health or Behavioral Health Crisis or need someone to talk to, please call 1-800-273-TALK (toll free, 24 hour hotline)  Patient verbalizes understanding of instructions and care plan provided today and agrees to view in MyChart. Active MyChart status and patient understanding of how to access instructions and care plan via MyChart confirmed with patient.     [name] RN BSN CCM [place]  [facility], [facility] Health Nurse Care Coordinator  Direct Dial: [phone] Website: [email or web address]

## 2023-01-16 ENCOUNTER — Other Ambulatory Visit: Payer: Self-pay | Admitting: Nurse Practitioner

## 2023-01-19 ENCOUNTER — Telehealth: Payer: Self-pay | Admitting: Nurse Practitioner

## 2023-01-19 ENCOUNTER — Telehealth: Payer: Self-pay | Admitting: *Deleted

## 2023-01-19 DIAGNOSIS — G4730 Sleep apnea, unspecified: Secondary | ICD-10-CM

## 2023-01-19 DIAGNOSIS — J9611 Chronic respiratory failure with hypoxia: Secondary | ICD-10-CM

## 2023-01-19 DIAGNOSIS — E1122 Type 2 diabetes mellitus with diabetic chronic kidney disease: Secondary | ICD-10-CM

## 2023-01-19 NOTE — Telephone Encounter (Signed)
Order placed and sent to [facility] for approval.   Please send to Clinical Intake so they can print and contact patient when ready.

## 2023-01-19 NOTE — Telephone Encounter (Signed)
-----   Message from Sharon Seller sent at 01/18/2023 10:33 AM EST ----- Regarding: FW: patient needs written Rx Can you help with this? ----- Message ----- From: Riley Churches, RN Sent: 01/12/2023  10:34 AM EST To: Sharon Seller, NP Subject: patient needs written Rx                       Good morning Shanda Bumps,   I wanted to let you know Mr. Vivona needs a hand written Rx for 2- 7 ft nasal cannulas with 150 ft extension tubing. He will need to come by the office to pick it up when ready. Can your staff call him once its ready to pick up?  Warmly, Raye Sorrow CCM [place]  [facility], [facility] Health Nurse Care Coordinator  Direct Dial: [phone] Website: [email or web address]

## 2023-01-19 NOTE — Telephone Encounter (Signed)
Forwarded to [facility] in Clinical Intake.

## 2023-01-19 NOTE — Telephone Encounter (Signed)
Patient wife dropped off forms for Medication Assistance Program to be completed and signed by Shanda Bumps. Also stated that patient is out of Jardiance 10 MG need a refill today.  Paper work given to [employer] in CI.

## 2023-01-19 NOTE — Telephone Encounter (Signed)
Order signed

## 2023-01-20 ENCOUNTER — Other Ambulatory Visit (HOSPITAL_BASED_OUTPATIENT_CLINIC_OR_DEPARTMENT_OTHER): Payer: Self-pay

## 2023-01-20 ENCOUNTER — Other Ambulatory Visit: Payer: Self-pay | Admitting: Nurse Practitioner

## 2023-01-20 ENCOUNTER — Other Ambulatory Visit (HOSPITAL_COMMUNITY): Payer: Self-pay

## 2023-01-20 DIAGNOSIS — I5022 Chronic systolic (congestive) heart failure: Secondary | ICD-10-CM | POA: Diagnosis not present

## 2023-01-20 DIAGNOSIS — J9611 Chronic respiratory failure with hypoxia: Secondary | ICD-10-CM | POA: Diagnosis not present

## 2023-01-20 DIAGNOSIS — I2724 Chronic thromboembolic pulmonary hypertension: Secondary | ICD-10-CM | POA: Diagnosis not present

## 2023-01-20 DIAGNOSIS — M170 Bilateral primary osteoarthritis of knee: Secondary | ICD-10-CM

## 2023-01-20 MED ORDER — EMPAGLIFLOZIN 10 MG PO TABS
10.0000 mg | ORAL_TABLET | Freq: Every day | ORAL | 0 refills | Status: DC
  Filled 2023-01-20: qty 30, 30d supply, fill #0

## 2023-01-20 MED ORDER — TRAMADOL HCL 50 MG PO TABS
50.0000 mg | ORAL_TABLET | Freq: Four times a day (QID) | ORAL | 0 refills | Status: DC | PRN
  Filled 2023-01-20: qty 30, 8d supply, fill #0

## 2023-01-20 NOTE — Addendum Note (Signed)
Addended by: Maurice Small on: 01/20/2023 12:59 PM   Modules accepted: Orders

## 2023-01-20 NOTE — Telephone Encounter (Signed)
Patient is requesting a refill of the following medications: Requested Prescriptions   Pending Prescriptions Disp Refills   traMADol (ULTRAM) 50 MG tablet 30 tablet 0    Sig: Take 1 tablet (50 mg total) by mouth every 6 (six) hours as needed for pain    Date of last refill: 12/01/2022  Refill amount: 0  Treatment agreement date: 06/29/2022

## 2023-01-20 NOTE — Telephone Encounter (Addendum)
Spoke with patient to find out if he needs a short term supply sent to a local pharmacy. RX sent to [facility] pharmacy.

## 2023-01-21 DIAGNOSIS — R0689 Other abnormalities of breathing: Secondary | ICD-10-CM | POA: Diagnosis not present

## 2023-01-21 DIAGNOSIS — J984 Other disorders of lung: Secondary | ICD-10-CM | POA: Diagnosis not present

## 2023-01-22 NOTE — Telephone Encounter (Signed)
[employer] Patient Assistance Forms on CI Desk for [employer].   Filled out and signed. Faxed to [employer] Fax: [phone]  Copy sent to scanning.

## 2023-01-25 ENCOUNTER — Ambulatory Visit: Payer: Self-pay | Admitting: Licensed Clinical Social Worker

## 2023-01-25 ENCOUNTER — Ambulatory Visit: Payer: Self-pay

## 2023-01-25 NOTE — Patient Instructions (Signed)
Visit Information  Thank you for taking time to visit with me today. Please don't hesitate to contact me if I can be of assistance to you.   Following are the goals we discussed today:   Goals Addressed             This Visit's Progress    Care Coordination Activities       Care Coordination Interventions: Patient stated that he needs information and contact for Legal Aide, SW will mail recourses, Patient is concerned about utilizing a medical device like a CPAP that has caused him to him breathing issues.  Patient stated that he and his wife were having some food insecurities but now things are better but could still use some resources, SW will mail some resources for food pantry's Patient stated that his utility bills are high but they do pay them and they are not behind or in jeopardy of being disconnected, but would like some resources, SW will mail some utility resources. Sw will follow up on 02/09/2023 at 11:15 am         Our next appointment is by telephone on 02/09/2023 at 11:15 am  Please call the care guide team at [phone] if you need to cancel or reschedule your appointment.   If you are experiencing a Mental Health or Behavioral Health Crisis or need someone to talk to, please call the Suicide and Crisis Lifeline: 988 go to [facility] [address] [place] [phone]) call 911  Patient verbalizes understanding of instructions and care plan provided today and agrees to view in MyChart. Active MyChart status and patient understanding of how to access instructions and care plan via MyChart confirmed with patient.     Jeanie Cooks, PhD [facility], [facility] Social Worker Direct Dial: [phone]  Fax: [phone]

## 2023-01-25 NOTE — Patient Outreach (Signed)
  Care Coordination   Initial Visit Note   01/25/2023 Name: Thomas [name] MRN: [id number] DOB: 05-Feb-1951  Thomas [name] is a 72 y.o. year old male who sees [name], [name], NP for primary care. I spoke with  [name] by phone today.  What matters to the patients health and wellness today?  Legal Aid, Food insecurities and Utility assistance    Goals Addressed             This Visit's Progress    Care Coordination Activities       Care Coordination Interventions: Patient stated that he needs information and contact for Legal Aide, SW will mail recourses, Patient is concerned about utilizing a medical device like a CPAP that has caused him to him breathing issues.  Patient stated that he and his wife were having some food insecurities but now things are better but could still use some resources, SW will mail some resources for food pantry's Patient stated that his utility bills are high but they do pay them and they are not behind or in jeopardy of being disconnected, but would like some resources, SW will mail some utility resources. Sw will follow up on 02/09/2023 at 11:15 am         SDOH assessments and interventions completed:  Yes  SDOH Interventions Today    Flowsheet Row Most Recent Value  SDOH Interventions   Food Insecurity Interventions Other (Comment)  [SW Will mail out resources for food pantry]  Housing Interventions Intervention Not Indicated  Transportation Interventions Intervention Not Indicated  Utilities Interventions Other (Comment)        Care Coordination Interventions:  Yes, provided  Interventions Today    Flowsheet Row Most Recent Value  General Interventions   General Interventions Discussed/Reviewed General Interventions Discussed  [Legal Aid resources, utility resources and food insecurities]        Follow up plan: Follow up call scheduled for 02/09/2023 at 11:15 am    Encounter Outcome:  Patient Visit  Completed   Jeanie Cooks, PhD [facility], [facility] Social Worker Direct Dial: [phone]  Fax: [phone]

## 2023-01-25 NOTE — Patient Outreach (Addendum)
  Care Coordination   Follow Up Visit Note   01/25/2023 Name: Thomas [name] MRN: [id number] DOB: August 12, 1950  Thomas [name] is a 72 y.o. year old male who sees [name], [name], NP for primary care. I spoke with  [name] by phone today.  What matters to the patients health and wellness today?  Patient would like to get a second opinion for his lung condition.     Goals Addressed             This Visit's Progress    To improve shortness of breath   On track    Care Coordination Interventions: Evaluation of current treatment plan related to dyspnea on exertion  and patient's adherence to plan as established by provider Discussed with patient his wife visited the PCP office, unfortunately an Rx for his oxygen tubing was not ready, wife was told this was sent to patient's pharmacy  Reviewed and discussed with patient a chart note entered by the PCP advising the Rx for his tubing was signed and forwarded to [facility] in Clinical Intake  Determined patient completed a f/u with [name], pulmonologist for PFT and evaluation of worsening dyspnea Discussed patient is not satisfied with the results and advise provided by [name], he would like a referral for a new Pulmonologist for a 2nd opinion Sent in basket message to Abbey Chatters NP requesting a referral to Dr. Sherene Sires with [facility] per patient's request for a second opinion  Patient will obtain his oxygen tubing from [facility] in order to wear his oxygen continuously as directed  Patient will schedule a new patient appointment with Dr. Sherene Sires for a second opinion Patient will maintain his oxygen saturations >90% and or notify his lung doctor of new or worsening symptoms Patient will keep all scheduled MD follow up appointments as directed  Patient will continue to work with nurse care coordinator for chronic disease management and care coordination needs    Interventions Today    Flowsheet Row Most  Recent Value  Chronic Disease   Chronic disease during today's visit Other  [Pulmonology Hypertension,  dyspnea]  General Interventions   General Interventions Discussed/Reviewed General Interventions Discussed, General Interventions Reviewed, Doctor Visits, Durable Medical Equipment (DME), Communication with  Doctor Visits Discussed/Reviewed Doctor Visits Discussed, Doctor Visits Reviewed, Specialist  Durable Medical Equipment (DME) Other, Oxygen  [CPAP]  Communication with PCP/Specialists  Abbey Chatters NP]  Exercise Interventions   Physical Activity Discussed/Reviewed Physical Activity Reviewed, Physical Activity Discussed  Education Interventions   Education Provided Provided Education  Provided Verbal Education On When to see the doctor           SDOH assessments and interventions completed:  No     Care Coordination Interventions:  Yes, provided   Follow up plan: Follow up call scheduled for 02/11/23 @2 :00 PM    Encounter Outcome:  Patient Visit Completed

## 2023-01-25 NOTE — Telephone Encounter (Signed)
Patient wife, Thomas [name] called and stated that [employer] did not receive the Patient Assistant Forms for patient's Jardiance.   Refaxed as requested to [employer] Fax: [phone]

## 2023-01-25 NOTE — Patient Instructions (Signed)
Visit Information  Thank you for taking time to visit with me today. Please don't hesitate to contact me if I can be of assistance to you.   Following are the goals we discussed today:   Goals Addressed             This Visit's Progress    To improve shortness of breath   On track    Care Coordination Interventions: Evaluation of current treatment plan related to dyspnea on exertion  and patient's adherence to plan as established by provider Discussed with patient his wife visited the PCP office, unfortunately an Rx for his oxygen tubing was not ready, wife was told this was sent to patient's pharmacy  Reviewed and discussed with patient a chart note entered by the PCP advising the Rx for his tubing was signed and forwarded to [facility] in Clinical Intake  Determined patient completed a f/u with [name], pulmonologist for PFT and evaluation of worsening dyspnea Discussed patient is not satisfied with the results and advise provided by [name], he would like a referral for a new Pulmonologist for a 2nd opinion Sent in basket message to Abbey Chatters NP requesting a referral to Dr. Sherene Sires with [facility] per patient's request for a second opinion  Patient will obtain his oxygen tubing from [employer] in order to wear his oxygen continuously as directed  Patient will schedule a new patient appointment with Dr. Sherene Sires for a second opinion Patient will maintain his oxygen saturations >90% and or notify his lung doctor of new or worsening symptoms Patient will keep all scheduled MD follow up appointments as directed  Patient will continue to work with nurse care coordinator for chronic disease management and care coordination needs        Our next appointment is by telephone on 02/11/23 at 2:00 PM  Please call the care guide team at [phone] if you need to cancel or reschedule your appointment.   If you are experiencing a Mental Health or Behavioral Health Crisis or need  someone to talk to, please call 1-800-273-TALK (toll free, 24 hour hotline)  Patient verbalizes understanding of instructions and care plan provided today and agrees to view in MyChart. Active MyChart status and patient understanding of how to access instructions and care plan via MyChart confirmed with patient.     [name] RN BSN CCM [place]  [facility], [facility] Nurse Care Coordinator  Direct Dial: [phone] Website: [email or web address]

## 2023-02-01 ENCOUNTER — Other Ambulatory Visit (HOSPITAL_COMMUNITY): Payer: Self-pay

## 2023-02-04 ENCOUNTER — Telehealth (HOSPITAL_COMMUNITY): Payer: Self-pay

## 2023-02-04 NOTE — Telephone Encounter (Signed)
Called patient to see if he was interested in participating in the Pulmonary Rehab Program. Patient stated yes. Patient will come in for orientation on 02/05/23 @ 1:30PM and will attend the 10:15AM exercise class.   Pensions consultant.

## 2023-02-05 ENCOUNTER — Encounter (HOSPITAL_COMMUNITY): Payer: Self-pay

## 2023-02-05 ENCOUNTER — Encounter (HOSPITAL_COMMUNITY)
Admission: RE | Admit: 2023-02-05 | Discharge: 2023-02-05 | Disposition: A | Discharge status: Home / Self Care | Payer: Medicare HMO | Source: Ambulatory Visit | Attending: Obstetrics and Gynecology | Admitting: Obstetrics and Gynecology

## 2023-02-05 ENCOUNTER — Other Ambulatory Visit: Payer: Self-pay

## 2023-02-05 VITALS — BP 122/62 | HR 61 | Ht 69.0 in | Wt 280.2 lb

## 2023-02-05 DIAGNOSIS — I5042 Chronic combined systolic (congestive) and diastolic (congestive) heart failure: Secondary | ICD-10-CM | POA: Insufficient documentation

## 2023-02-05 NOTE — Progress Notes (Signed)
Pulmonary Individual Treatment Plan  Patient Details  Name: Thomas [name] MRN: [id number] Date of Birth: 1951/01/15 Referring Provider:   Doristine Devoid Pulmonary Rehab Walk Test from 02/05/2023 in [facility]  Referring Provider Sood       Initial Encounter Date:  Flowsheet Row Pulmonary Rehab Walk Test from 02/05/2023 in [facility]  Date 02/05/23       Visit Diagnosis: Chronic combined systolic (congestive) and diastolic (congestive) heart failure (HCC)  Patient's Home Medications on Admission:   Current Outpatient Medications:    Accu-Chek Softclix Lancets lancets, Use to test blood sugar daily, Disp: 200 each, Rfl: 3   acetaminophen (TYLENOL) 500 MG tablet, Take by mouth., Disp: , Rfl:    Alcohol Swabs (B-D SINGLE USE SWABS REGULAR) PADS, Use in testing blood sugar daily. Dx: E11.22, Disp: 100 each, Rfl: 3   amLODipine (NORVASC) 10 MG tablet, TAKE 1 TABLET EVERY DAY FOR HIGH BLOOD PRESSURE, Disp: 90 tablet, Rfl: 1   aspirin EC 81 MG tablet, Take 81 mg by mouth daily., Disp: , Rfl:    atorvastatin (LIPITOR) 40 MG tablet, TAKE 1 TABLET EVERY DAY, Disp: 90 tablet, Rfl: 3   Blood Glucose Calibration (ACCU-CHEK AVIVA) SOLN, Use once daily as directed dx E11.22, Disp: 1 each, Rfl: 2   Blood Glucose Monitoring Suppl (TRUE METRIX AIR GLUCOSE METER) w/Device KIT, 1 Device by Does not apply route daily as needed. E11.22, Disp: 1 kit, Rfl: 0   carvedilol (COREG) 12.5 MG tablet, TAKE 1 TABLET TWICE DAILY WITH MEALS, Disp: 180 tablet, Rfl: 3   Cholecalciferol (VITAMIN D3) 50 MCG (2000 UT) TABS, Take 1 tablet (2,000 Units) by mouth daily., Disp: 10 tablet, Rfl: 0   empagliflozin (JARDIANCE) 10 MG TABS tablet, Take 1 tablet (10 mg total) by mouth daily., Disp: 30 tablet, Rfl: 0   furosemide (LASIX) 20 MG tablet, TAKE 1 TABLET AS DIRECTED AS NEEDED, Disp: 90 tablet, Rfl:  3   glucose blood (ACCU-CHEK AVIVA PLUS) test strip, Use to test blood sugar daily., Disp: 100 each, Rfl: 3   hydrALAZINE (APRESOLINE) 25 MG tablet, TAKE 3 TABLETS THREE TIMES DAILY, Disp: 810 tablet, Rfl: 3   Lancets Misc. (ACCU-CHEK SOFTCLIX LANCET DEV) KIT, Use to test blood sugar daily, Disp: 1 kit, Rfl: 0   losartan (COZAAR) 50 MG tablet, TAKE 1 TABLET EVERY DAY, Disp: 90 tablet, Rfl: 3   metFORMIN (GLUCOPHAGE) 500 MG tablet, TAKE 1 TABLET TWICE DAILY WITH MEALS, Disp: 180 tablet, Rfl: 3   potassium chloride SA (KLOR-CON M) 20 MEQ tablet, TAKE 1 TABLET TWICE DAILY, Disp: 180 tablet, Rfl: 3   rivaroxaban (XARELTO) 20 MG TABS tablet, Take 1 tablet (20 mg total) by mouth daily with supper., Disp: 30 tablet, Rfl: 12   Senna 8.7 MG CHEW, Chew 1 tablet by mouth as needed., Disp: 90 tablet, Rfl: 1   spironolactone (ALDACTONE) 50 MG tablet, Take 50 mg by mouth daily., Disp: 90 tablet, Rfl: 3   tirzepatide (MOUNJARO) 15 MG/0.5ML Pen, Inject 15 mg into the skin once a week., Disp: 2 mL, Rfl: 11   traMADol (ULTRAM) 50 MG tablet, Take 1 tablet (50 mg total) by mouth every 6 (six) hours as needed for pain, Disp: 30 tablet, Rfl: 0   TRUEplus Lancets 28G MISC, TEST BLOOD SUGAR EVERY DAY AS NEEDED, Disp: 100 each, Rfl: 3   zinc gluconate 50 MG tablet, Take  1 tablet (50 mg total) by mouth daily., Disp: 14 tablet, Rfl: 0  Past Medical History: Past Medical History:  Diagnosis Date   Acute on chronic diastolic CHF (congestive heart failure) (HCC) 06/01/2017   Arthritis    Cancer (HCC) 2010   Prostate   Chronic kidney disease    Diabetes mellitus    Diabetic neuropathy (HCC)    feet   DVT (deep venous thrombosis) (HCC)    Genetic testing 06/22/2016   Thomas [name] underwent genetic counseling and testing for hereditary cancer syndromes on 05/14/2016. His results were negative for mutations in all 46 genes analyzed by [facility]'s 46-gene Common Hereditary Cancers Panel. Genes analyzed include: APC, ATM, AXIN2,  BARD1, BMPR1A, BRCA1, BRCA2, BRIP1, CDH1, CDKN2A, CHEK2, CTNNA1, DICER1, EPCAM, GREM1, HOXB13, KIT, MEN1, MLH1, MSH2, MSH3, MSH6, MUTYH, NB   GERD (gastroesophageal reflux disease)    Hypertension    PE (pulmonary thromboembolism) (HCC)    Pneumonia    Sleep apnea    not wearing CPAP   Suprapubic catheter (HCC) 11/17/2019    Tobacco Use: Social History   Tobacco Use  Smoking Status Never   Passive exposure: Never  Smokeless Tobacco Never    Labs: Review Flowsheet  More data exists      Latest Ref Rng & Units 05/19/2021 09/29/2021 03/30/2022 07/03/2022 11/03/2022  Labs for ITP Cardiac and Pulmonary Rehab  Cholestrol 100 - 199 mg/dL 119  - 147  - 829   LDL (calc) 0 - 99 mg/dL 47  - 62  - 48   HDL-C >39 mg/dL 52  - 58  - 46   Trlycerides 0 - 149 mg/dL 65  - 84  - 75   Hemoglobin A1c 4.8 - 5.6 % 6.1  7.2  8.6  6.5  5.6     Capillary Blood Glucose: Lab Results  Component Value Date   GLUCAP 180 (H) 10/20/2018   GLUCAP 200 (H) 07/06/2017   GLUCAP 216 (H) 06/03/2017   GLUCAP 230 (H) 06/03/2017   GLUCAP 225 (H) 06/02/2017     Pulmonary Assessment Scores:  Pulmonary Assessment Scores     Row Name 02/05/23 1447         ADL UCSD   ADL Phase Entry     SOB Score total 66       CAT Score   CAT Score 22       mMRC Score   mMRC Score 4             UCSD: Self-administered rating of dyspnea associated with activities of daily living (ADLs) 6-point scale (0 = "not at all" to 5 = "maximal or unable to do because of breathlessness")  Scoring Scores range from 0 to 120.  Minimally important difference is 5 units  CAT: CAT can identify the health impairment of COPD patients and is better correlated with disease progression.  CAT has a scoring range of zero to 40. The CAT score is classified into four groups of low (less than 10), medium (10 - 20), high (21-30) and very high (31-40) based on the impact level of disease on health status. A CAT score over 10 suggests  significant symptoms.  A worsening CAT score could be explained by an exacerbation, poor medication adherence, poor inhaler technique, or progression of COPD or comorbid conditions.  CAT MCID is 2 points  mMRC: mMRC (Modified Medical Research Council) Dyspnea Scale is used to assess the degree of baseline functional disability in patients of respiratory disease due  to dyspnea. No minimal important difference is established. A decrease in score of 1 point or greater is considered a positive change.   Pulmonary Function Assessment:  Pulmonary Function Assessment - 02/05/23 1338       Breath   Bilateral Breath Sounds Clear    Shortness of Breath Yes;Limiting activity             Exercise Target Goals: Exercise Program Goal: Individual exercise prescription set using results from initial 6 min walk test and THRR while considering  patient's activity barriers and safety.   Exercise Prescription Goal: Initial exercise prescription builds to 30-45 minutes a day of aerobic activity, 2-3 days per week.  Home exercise guidelines will be given to patient during program as part of exercise prescription that the participant will acknowledge.  Activity Barriers & Risk Stratification:  Activity Barriers & Cardiac Risk Stratification - 02/05/23 1336       Activity Barriers & Cardiac Risk Stratification   Activity Barriers Right Knee Replacement;Left Knee Replacement;Muscular Weakness;Deconditioning;Joint Problems;Balance Concerns;Shortness of Breath;Assistive Device             6 Minute Walk:  6 Minute Walk     Row Name 02/05/23 1438         6 Minute Walk   Phase Initial  Nustep test     Distance 1584 feet     Walk Time 6 minutes     # of Rest Breaks 0     MPH 3     METS 2.61     RPE 11     Perceived Dyspnea  1     VO2 Peak 9.13     Symptoms No     Resting HR 61 bpm     Resting BP 122/62     Resting Oxygen Saturation  94 %     Exercise Oxygen Saturation  during 6 min  walk 87 %     Max Ex. HR 123 bpm     Max Ex. BP 122/64     2 Minute Post BP 120/60       Interval HR   1 Minute HR 84     2 Minute HR 89     3 Minute HR 97     4 Minute HR 99     5 Minute HR 89     6 Minute HR 123     2 Minute Post HR 64     Interval Heart Rate? Yes       Interval Oxygen   Interval Oxygen? Yes     Baseline Oxygen Saturation % 94 %     1 Minute Oxygen Saturation % 90 %     1 Minute Liters of Oxygen 0 L     2 Minute Oxygen Saturation % 89 %     2 Minute Liters of Oxygen 0 L     3 Minute Oxygen Saturation % 90 %     3 Minute Liters of Oxygen 0 L     4 Minute Oxygen Saturation % 89 %     4 Minute Liters of Oxygen 0 L     5 Minute Oxygen Saturation % 88 %     5 Minute Liters of Oxygen 0 L     6 Minute Oxygen Saturation % 88 %  87% @ 5:39     6 Minute Liters of Oxygen 2 L     2 Minute Post Oxygen Saturation % 96 %  2 Minute Post Liters of Oxygen 2 L              Oxygen Initial Assessment:  Oxygen Initial Assessment - 02/05/23 1337       Home Oxygen   Home Oxygen Device E-Tanks;Home Concentrator    Sleep Oxygen Prescription CPAP    Liters per minute 0    Home Exercise Oxygen Prescription Continuous    Liters per minute 2    Home Resting Oxygen Prescription None    Compliance with Home Oxygen Use Yes      Initial 6 min Walk   Oxygen Used Continuous    Liters per minute 2      Program Oxygen Prescription   Program Oxygen Prescription Continuous    Liters per minute 2      Intervention   Short Term Goals To learn and exhibit compliance with exercise, home and travel O2 prescription;To learn and understand importance of monitoring SPO2 with pulse oximeter and demonstrate accurate use of the pulse oximeter.;To learn and understand importance of maintaining oxygen saturations>88%;To learn and demonstrate proper pursed lip breathing techniques or other breathing techniques.     Long  Term Goals Exhibits compliance with exercise, home  and travel O2  prescription;Verbalizes importance of monitoring SPO2 with pulse oximeter and return demonstration;Maintenance of O2 saturations>88%;Exhibits proper breathing techniques, such as pursed lip breathing or other method taught during program session             Oxygen Re-Evaluation:  Oxygen Re-Evaluation     Row Name 02/05/23 1337             Goals/Expected Outcomes   Goals/Expected Outcomes Compliance and understanding of oxygen saturation monitoring and breathing techniques to decrease shortness of breath.                Oxygen Discharge (Final Oxygen Re-Evaluation):  Oxygen Re-Evaluation - 02/05/23 1337       Goals/Expected Outcomes   Goals/Expected Outcomes Compliance and understanding of oxygen saturation monitoring and breathing techniques to decrease shortness of breath.             Initial Exercise Prescription:  Initial Exercise Prescription - 02/05/23 1400       Date of Initial Exercise RX and Referring Provider   Date 02/05/23    Referring Provider Sood    Expected Discharge Date 05/04/23      NuStep   Level 1    SPM 92    Minutes 30    METs 1.9      Prescription Details   Frequency (times per week) 2    Duration Progress to 30 minutes of continuous aerobic without signs/symptoms of physical distress      Intensity   THRR 40-80% of Max Heartrate 59-118    Ratings of Perceived Exertion 11-13    Perceived Dyspnea 0-4      Progression   Progression Continue to progress workloads to maintain intensity without signs/symptoms of physical distress.      Resistance Training   Training Prescription Yes    Weight blue bands    Reps 10-15             Perform Capillary Blood Glucose checks as needed.  Exercise Prescription Changes:   Exercise Comments:   Exercise Goals and Review:   Exercise Goals     Row Name 02/05/23 1336             Exercise Goals   Increase Physical Activity Yes  Intervention Provide advice,  education, support and counseling about physical activity/exercise needs.;Develop an individualized exercise prescription for aerobic and resistive training based on initial evaluation findings, risk stratification, comorbidities and participant's personal goals.       Expected Outcomes Short Term: Attend rehab on a regular basis to increase amount of physical activity.;Long Term: Add in home exercise to make exercise part of routine and to increase amount of physical activity.;Long Term: Exercising regularly at least 3-5 days a week.       Increase Strength and Stamina Yes       Intervention Provide advice, education, support and counseling about physical activity/exercise needs.;Develop an individualized exercise prescription for aerobic and resistive training based on initial evaluation findings, risk stratification, comorbidities and participant's personal goals.       Expected Outcomes Short Term: Increase workloads from initial exercise prescription for resistance, speed, and METs.;Short Term: Perform resistance training exercises routinely during rehab and add in resistance training at home;Long Term: Improve cardiorespiratory fitness, muscular endurance and strength as measured by increased METs and functional capacity ( )       Able to understand and use rate of perceived exertion (RPE) scale Yes       Intervention Provide education and explanation on how to use RPE scale       Expected Outcomes Short Term: Able to use RPE daily in rehab to express subjective intensity level;Long Term:  Able to use RPE to guide intensity level when exercising independently       Able to understand and use Dyspnea scale Yes       Intervention Provide education and explanation on how to use Dyspnea scale       Expected Outcomes Short Term: Able to use Dyspnea scale daily in rehab to express subjective sense of shortness of breath during exertion;Long Term: Able to use Dyspnea scale to guide intensity level when  exercising independently       Knowledge and understanding of Target Heart Rate Range (THRR) Yes       Intervention Provide education and explanation of THRR including how the numbers were predicted and where they are located for reference       Expected Outcomes Short Term: Able to state/look up THRR;Short Term: Able to use daily as guideline for intensity in rehab;Long Term: Able to use THRR to govern intensity when exercising independently       Understanding of Exercise Prescription Yes       Intervention Provide education, explanation, and written materials on patient's individual exercise prescription       Expected Outcomes Short Term: Able to explain program exercise prescription;Long Term: Able to explain home exercise prescription to exercise independently                Exercise Goals Re-Evaluation :  Exercise Goals Re-Evaluation     Row Name 02/05/23 1446             Exercise Goal Re-Evaluation   Exercise Goals Review Increase Physical Activity;Able to understand and use Dyspnea scale;Understanding of Exercise Prescription;Increase Strength and Stamina;Knowledge and understanding of Target Heart Rate Range (THRR);Able to understand and use rate of perceived exertion (RPE) scale       Comments Thomas [name] is scheduled to start exercise on 12/26. Will monitor and progress as able.       Expected Outcomes Through exercise at rehab and home, the patient will decrease shortness of breath with daily activites and feel confident in carrying out an exercise regimen at  home                Discharge Exercise Prescription (Final Exercise Prescription Changes):   Nutrition:  Target Goals: Understanding of nutrition guidelines, daily intake of sodium 1500mg , cholesterol 200mg , calories 30% from fat and 7% or less from saturated fats, daily to have 5 or more servings of fruits and vegetables.  Biometrics:  Pre Biometrics - 02/05/23 1348       Pre Biometrics   Grip Strength  40 kg              Nutrition Therapy Plan and Nutrition Goals:   Nutrition Assessments:  MEDIFICTS Score Key: >=70 Need to make dietary changes  40-70 Heart Healthy Diet <= 40 Therapeutic Level Cholesterol Diet   Picture Your Plate Scores: <16 Unhealthy dietary pattern with much room for improvement. 41-50 Dietary pattern unlikely to meet recommendations for good health and room for improvement. 51-60 More healthful dietary pattern, with some room for improvement.  >60 Healthy dietary pattern, although there may be some specific behaviors that could be improved.    Nutrition Goals Re-Evaluation:   Nutrition Goals Discharge (Final Nutrition Goals Re-Evaluation):   Psychosocial: Target Goals: Acknowledge presence or absence of significant depression and/or stress, maximize coping skills, provide positive support system. Participant is able to verbalize types and ability to use techniques and skills needed for reducing stress and depression.  Initial Review & Psychosocial Screening:  Initial Psych Review & Screening - 02/05/23 1340       Initial Review   Current issues with None Identified      Family Dynamics   Good Support System? Yes      Barriers   Psychosocial barriers to participate in program There are no identifiable barriers or psychosocial needs.      Screening Interventions   Interventions Encouraged to exercise    Expected Outcomes Long Term Goal: Stressors or current issues are controlled or eliminated.;Short Term goal: Identification and review with participant of any Quality of Life or Depression concerns found by scoring the questionnaire.;Long Term goal: The participant improves quality of Life and PHQ9 Scores as seen by post scores and/or verbalization of changes             Quality of Life Scores:  Scores of 19 and below usually indicate a poorer quality of life in these areas.  A difference of  2-3 points is a clinically meaningful  difference.  A difference of 2-3 points in the total score of the Quality of Life Index has been associated with significant improvement in overall quality of life, self-image, physical symptoms, and general health in studies assessing change in quality of life.  PHQ-9: Review Flowsheet  More data exists      02/05/2023 11/13/2022 11/18/2021 10/30/2021 05/19/2021  Depression screen PHQ 2/9  Decreased Interest 0 0 0 0 0  Down, Depressed, Hopeless 1 0 0 0 0  PHQ - 2 Score 1 0 0 0 0  Altered sleeping 0 - 0 - -  Tired, decreased energy 1 - 0 - -  Change in appetite 0 - 0 - -  Feeling bad or failure about yourself  0 - 0 - -  Trouble concentrating 0 - 0 - -  Moving slowly or fidgety/restless 0 - 0 - -  Suicidal thoughts 0 - 0 - -  PHQ-9 Score 2 - 0 - -  Difficult doing work/chores Somewhat difficult - Not difficult at all - -   Interpretation of Total  Score  Total Score Depression Severity:  1-4 = Minimal depression, 5-9 = Mild depression, 10-14 = Moderate depression, 15-19 = Moderately severe depression, 20-27 = Severe depression   Psychosocial Evaluation and Intervention:  Psychosocial Evaluation - 02/05/23 1341       Psychosocial Evaluation & Interventions   Interventions Encouraged to exercise with the program and follow exercise prescription    Comments Thomas [name] denies any psychosocial barriers or concerns    Expected Outcomes For Thomas [name] to participate in PR free of barriers    Continue Psychosocial Services  No Follow up required             Psychosocial Re-Evaluation:   Psychosocial Discharge (Final Psychosocial Re-Evaluation):   Education: Education Goals: Education classes will be provided on a weekly basis, covering required topics. Participant will state understanding/return demonstration of topics presented.  Learning Barriers/Preferences:  Learning Barriers/Preferences - 02/05/23 1341       Learning Barriers/Preferences   Learning Barriers None     Learning Preferences Pictoral;Group Instruction             Education Topics: Know Your Numbers Group instruction that is supported by a PowerPoint presentation. Instructor discusses importance of knowing and understanding resting, exercise, and post-exercise oxygen saturation, heart rate, and blood pressure. Oxygen saturation, heart rate, blood pressure, rating of perceived exertion, and dyspnea are reviewed along with a normal range for these values.    Exercise for the Pulmonary Patient Group instruction that is supported by a PowerPoint presentation. Instructor discusses benefits of exercise, core components of exercise, frequency, duration, and intensity of an exercise routine, importance of utilizing pulse oximetry during exercise, safety while exercising, and options of places to exercise outside of rehab.    MET Level  Group instruction provided by PowerPoint, verbal discussion, and written material to support subject matter. Instructor reviews what METs are and how to increase METs.    Pulmonary Medications Verbally interactive group education provided by instructor with focus on inhaled medications and proper administration.   Anatomy and Physiology of the Respiratory System Group instruction provided by PowerPoint, verbal discussion, and written material to support subject matter. Instructor reviews respiratory cycle and anatomical components of the respiratory system and their functions. Instructor also reviews differences in obstructive and restrictive respiratory diseases with examples of each.    Oxygen Safety Group instruction provided by PowerPoint, verbal discussion, and written material to support subject matter. There is an overview of "What is Oxygen" and "Why do we need it".  Instructor also reviews how to create a safe environment for oxygen use, the importance of using oxygen as prescribed, and the risks of noncompliance. There is a brief discussion on traveling  with oxygen and resources the patient may utilize.   Oxygen Use Group instruction provided by PowerPoint, verbal discussion, and written material to discuss how supplemental oxygen is prescribed and different types of oxygen supply systems. Resources for more information are provided.    Breathing Techniques Group instruction that is supported by demonstration and informational handouts. Instructor discusses the benefits of pursed lip and diaphragmatic breathing and detailed demonstration on how to perform both.     Risk Factor Reduction Group instruction that is supported by a PowerPoint presentation. Instructor discusses the definition of a risk factor, different risk factors for pulmonary disease, and how the heart and lungs work together.   Pulmonary Diseases Group instruction provided by PowerPoint, verbal discussion, and written material to support subject matter. Instructor gives an overview of the different type  of pulmonary diseases. There is also a discussion on risk factors and symptoms as well as ways to manage the diseases.   Stress and Energy Conservation Group instruction provided by PowerPoint, verbal discussion, and written material to support subject matter. Instructor gives an overview of stress and the impact it can have on the body. Instructor also reviews ways to reduce stress. There is also a discussion on energy conservation and ways to conserve energy throughout the day.   Warning Signs and Symptoms Group instruction provided by PowerPoint, verbal discussion, and written material to support subject matter. Instructor reviews warning signs and symptoms of stroke, heart attack, cold and flu. Instructor also reviews ways to prevent the spread of infection.   Other Education Group or individual verbal, written, or video instructions that support the educational goals of the pulmonary rehab program.    Knowledge Questionnaire Score:  Knowledge Questionnaire Score -  02/05/23 1453       Knowledge Questionnaire Score   Pre Score 11/18             Core Components/Risk Factors/Patient Goals at Admission:  Personal Goals and Risk Factors at Admission - 02/05/23 1342       Core Components/Risk Factors/Patient Goals on Admission   Improve shortness of breath with ADL's Yes    Intervention Provide education, individualized exercise plan and daily activity instruction to help decrease symptoms of SOB with activities of daily living.    Expected Outcomes Short Term: Improve cardiorespiratory fitness to achieve a reduction of symptoms when performing ADLs;Long Term: Be able to perform more ADLs without symptoms or delay the onset of symptoms    Heart Failure Yes    Intervention Provide a combined exercise and nutrition program that is supplemented with education, support and counseling about heart failure. Directed toward relieving symptoms such as shortness of breath, decreased exercise tolerance, and extremity edema.    Expected Outcomes Improve functional capacity of life;Short term: Attendance in program 2-3 days a week with increased exercise capacity. Reported lower sodium intake. Reported increased fruit and vegetable intake. Reports medication compliance.;Short term: Daily weights obtained and reported for increase. Utilizing diuretic protocols set by physician.;Long term: Adoption of self-care skills and reduction of barriers for early signs and symptoms recognition and intervention leading to self-care maintenance.             Core Components/Risk Factors/Patient Goals Review:    Core Components/Risk Factors/Patient Goals at Discharge (Final Review):    ITP Comments:   Comments: Dr. Mechele Collin is Medical Director for Pulmonary Rehab at [facility].

## 2023-02-05 NOTE — Progress Notes (Signed)
[name] 72 y.o. male  Pulmonary Rehab Orientation Note  This patient who was referred to Pulmonary Rehab by [name] with the diagnosis of combined heart failure arrived today in Cardiac and Pulmonary Rehab. He arrived in wheelchair with able to turn an pivot into chair. He does carry portable oxygen. [facility] is the provider for their DME. Per patient, Bransen uses oxygen with exertion. Color good, skin warm and dry. Patient is oriented to time and place. Patient's medical history, psychosocial health, and medications reviewed.   Psychosocial assessment reveals patient lives with spouse. Meridith is currently retired. Patient hobbies include watching tv and spending time with others. Patient reports his stress level is low. Areas of stress/anxiety include health. PHQ2/9 score 1/2. Ivin shows good  coping skills with positive outlook on life. Offered emotional support and reassurance. Will continue to monitor and evaluate progress toward psychosocial goal(s) of decreased stress related to his health. Physical assessment reveals heart rate is normal, breath sounds clear to auscultation, no wheezes, rales, or rhonchi. Grip strength equal, strong. Distal pulses present. Jenna reports he  does take medications as prescribed. Patient states he  follows a low fat  diet. The patient has been trying to lose weight through a healthy diet and exercise program. Pt's weight will be monitored closely.   Demonstration and practice of PLB using pulse oximeter. Bocephus able to return demonstration satisfactorily. Safety and hand hygiene in the exercise area reviewed with patient. Maikol voices understanding of the information reviewed. Department expectations discussed with patient and achievable goals were set. The patient shows enthusiasm about attending the program and we look forward to working with Gardiner Barefoot. Marlone completed a 6 min walk test today and is scheduled to begin exercise on  12/26 at 1015.   [id number] [facility]

## 2023-02-09 ENCOUNTER — Ambulatory Visit: Payer: Self-pay | Admitting: Licensed Clinical Social Worker

## 2023-02-09 NOTE — Patient Outreach (Signed)
  Care Coordination   02/09/2023 Name: Thomas [name] MRN: [id number] DOB: [birth year]   Care Coordination Outreach Attempts:  An unsuccessful outreach was attempted for an appointment today.  Follow Up Plan:  Additional outreach attempts will be made to offer the patient complex care management information and services.   Encounter Outcome:  No Answer   Care Coordination Interventions:  No, not indicated    Jeanie Cooks, PhD [facility], [facility] Social Worker Direct Dial: [phone]  Fax: [phone]

## 2023-02-09 NOTE — Progress Notes (Signed)
Pulmonary Individual Treatment Plan  Patient Details  Name: Thomas [name] MRN: [id number] Date of Birth: August 24, 1950 Referring Provider:   Doristine [name] Pulmonary Rehab Walk Test from 02/05/2023 in [facility]  Referring Provider Thomas [name]       Initial Encounter Date:  Flowsheet Row Pulmonary Rehab Walk Test from 02/05/2023 in [facility]  Date 02/05/23       Visit Diagnosis: Chronic combined systolic (congestive) and diastolic (congestive) heart failure (HCC)  Patient's Home Medications on Admission:   Current Outpatient Medications:    Accu-Chek Softclix Lancets lancets, Use to test blood sugar daily, Disp: 200 each, Rfl: 3   acetaminophen (TYLENOL) 500 MG tablet, Take by mouth., Disp: , Rfl:    Alcohol Swabs (B-D SINGLE USE SWABS REGULAR) PADS, Use in testing blood sugar daily. Dx: E11.22, Disp: 100 each, Rfl: 3   amLODipine (NORVASC) 10 MG tablet, TAKE 1 TABLET EVERY DAY FOR HIGH BLOOD PRESSURE, Disp: 90 tablet, Rfl: 1   aspirin EC 81 MG tablet, Take 81 mg by mouth daily., Disp: , Rfl:    atorvastatin (LIPITOR) 40 MG tablet, TAKE 1 TABLET EVERY DAY, Disp: 90 tablet, Rfl: 3   Blood Glucose Calibration (ACCU-CHEK AVIVA) SOLN, Use once daily as directed dx E11.22, Disp: 1 each, Rfl: 2   Blood Glucose Monitoring Suppl (TRUE METRIX AIR GLUCOSE METER) w/Device KIT, 1 Device by Does not apply route daily as needed. E11.22, Disp: 1 kit, Rfl: 0   carvedilol (COREG) 12.5 MG tablet, TAKE 1 TABLET TWICE DAILY WITH MEALS, Disp: 180 tablet, Rfl: 3   Cholecalciferol (VITAMIN D3) 50 MCG (2000 UT) TABS, Take 1 tablet (2,000 Units) by mouth daily., Disp: 10 tablet, Rfl: 0   empagliflozin (JARDIANCE) 10 MG TABS tablet, Take 1 tablet (10 mg total) by mouth daily., Disp: 30 tablet, Rfl: 0   furosemide (LASIX) 20 MG tablet, TAKE 1 TABLET AS DIRECTED AS NEEDED, Disp: 90 tablet, Rfl:  3   glucose blood (ACCU-CHEK AVIVA PLUS) test strip, Use to test blood sugar daily., Disp: 100 each, Rfl: 3   hydrALAZINE (APRESOLINE) 25 MG tablet, TAKE 3 TABLETS THREE TIMES DAILY, Disp: 810 tablet, Rfl: 3   Lancets Misc. (ACCU-CHEK SOFTCLIX LANCET DEV) KIT, Use to test blood sugar daily, Disp: 1 kit, Rfl: 0   losartan (COZAAR) 50 MG tablet, TAKE 1 TABLET EVERY DAY, Disp: 90 tablet, Rfl: 3   metFORMIN (GLUCOPHAGE) 500 MG tablet, TAKE 1 TABLET TWICE DAILY WITH MEALS, Disp: 180 tablet, Rfl: 3   potassium chloride SA (KLOR-CON M) 20 MEQ tablet, TAKE 1 TABLET TWICE DAILY, Disp: 180 tablet, Rfl: 3   rivaroxaban (XARELTO) 20 MG TABS tablet, Take 1 tablet (20 mg total) by mouth daily with supper., Disp: 30 tablet, Rfl: 12   Senna 8.7 MG CHEW, Chew 1 tablet by mouth as needed., Disp: 90 tablet, Rfl: 1   spironolactone (ALDACTONE) 50 MG tablet, Take 50 mg by mouth daily., Disp: 90 tablet, Rfl: 3   tirzepatide (MOUNJARO) 15 MG/0.5ML Pen, Inject 15 mg into the skin once a week., Disp: 2 mL, Rfl: 11   traMADol (ULTRAM) 50 MG tablet, Take 1 tablet (50 mg total) by mouth every 6 (six) hours as needed for pain, Disp: 30 tablet, Rfl: 0   TRUEplus Lancets 28G MISC, TEST BLOOD SUGAR EVERY DAY AS NEEDED, Disp: 100 each, Rfl: 3   zinc gluconate 50 MG tablet, Take  1 tablet (50 mg total) by mouth daily., Disp: 14 tablet, Rfl: 0  Past Medical History: Past Medical History:  Diagnosis Date   Acute on chronic diastolic CHF (congestive heart failure) (HCC) 06/01/2017   Arthritis    Cancer (HCC) 2010   Prostate   Chronic kidney disease    Diabetes mellitus    Diabetic neuropathy (HCC)    feet   DVT (deep venous thrombosis) (HCC)    Genetic testing 06/22/2016   Mr. Thomas [name] underwent genetic counseling and testing for hereditary cancer syndromes on 05/14/2016. His results were negative for mutations in all 46 genes analyzed by [facility]'s 46-gene Common Hereditary Cancers Panel. Genes analyzed include: APC, ATM, AXIN2,  BARD1, BMPR1A, BRCA1, BRCA2, BRIP1, CDH1, CDKN2A, CHEK2, CTNNA1, DICER1, EPCAM, GREM1, HOXB13, KIT, MEN1, MLH1, MSH2, MSH3, MSH6, MUTYH, NB   GERD (gastroesophageal reflux disease)    Hypertension    PE (pulmonary thromboembolism) (HCC)    Pneumonia    Sleep apnea    not wearing CPAP   Suprapubic catheter (HCC) 11/17/2019    Tobacco Use: Social History   Tobacco Use  Smoking Status Never   Passive exposure: Never  Smokeless Tobacco Never    Labs: Review Flowsheet  More data exists      Latest Ref Rng & Units 05/19/2021 09/29/2021 03/30/2022 07/03/2022 11/03/2022  Labs for ITP Cardiac and Pulmonary Rehab  Cholestrol 100 - 199 mg/dL 010  - 272  - 536   LDL (calc) 0 - 99 mg/dL 47  - 62  - 48   HDL-C >39 mg/dL 52  - 58  - 46   Trlycerides 0 - 149 mg/dL 65  - 84  - 75   Hemoglobin A1c 4.8 - 5.6 % 6.1  7.2  8.6  6.5  5.6     Capillary Blood Glucose: Lab Results  Component Value Date   GLUCAP 180 (H) 10/20/2018   GLUCAP 200 (H) 07/06/2017   GLUCAP 216 (H) 06/03/2017   GLUCAP 230 (H) 06/03/2017   GLUCAP 225 (H) 06/02/2017     Pulmonary Assessment Scores:  Pulmonary Assessment Scores     Row Name 02/05/23 1447         ADL UCSD   ADL Phase Entry     SOB Score total 66       CAT Score   CAT Score 22       mMRC Score   mMRC Score 4             UCSD: Self-administered rating of dyspnea associated with activities of daily living (ADLs) 6-point scale (0 = "not at all" to 5 = "maximal or unable to do because of breathlessness")  Scoring Scores range from 0 to 120.  Minimally important difference is 5 units  CAT: CAT can identify the health impairment of COPD patients and is better correlated with disease progression.  CAT has a scoring range of zero to 40. The CAT score is classified into four groups of low (less than 10), medium (10 - 20), high (21-30) and very high (31-40) based on the impact level of disease on health status. A CAT score over 10 suggests  significant symptoms.  A worsening CAT score could be explained by an exacerbation, poor medication adherence, poor inhaler technique, or progression of COPD or comorbid conditions.  CAT MCID is 2 points  mMRC: mMRC (Modified Medical Research Council) Dyspnea Scale is used to assess the degree of baseline functional disability in patients of respiratory disease due  to dyspnea. No minimal important difference is established. A decrease in score of 1 point or greater is considered a positive change.   Pulmonary Function Assessment:  Pulmonary Function Assessment - 02/05/23 1338       Breath   Bilateral Breath Sounds Clear    Shortness of Breath Yes;Limiting activity             Exercise Target Goals: Exercise Program Goal: Individual exercise prescription set using results from initial 6 min walk test and THRR while considering  patient's activity barriers and safety.   Exercise Prescription Goal: Initial exercise prescription builds to 30-45 minutes a day of aerobic activity, 2-3 days per week.  Home exercise guidelines will be given to patient during program as part of exercise prescription that the participant will acknowledge.  Activity Barriers & Risk Stratification:  Activity Barriers & Cardiac Risk Stratification - 02/05/23 1336       Activity Barriers & Cardiac Risk Stratification   Activity Barriers Right Knee Replacement;Left Knee Replacement;Muscular Weakness;Deconditioning;Joint Problems;Balance Concerns;Shortness of Breath;Assistive Device             6 Minute Walk:  6 Minute Walk     Row Name 02/05/23 1438         6 Minute Walk   Phase Initial  Nustep test     Distance 1584 feet     Walk Time 6 minutes     # of Rest Breaks 0     MPH 3     METS 2.61     RPE 11     Perceived Dyspnea  1     VO2 Peak 9.13     Symptoms No     Resting HR 61 bpm     Resting BP 122/62     Resting Oxygen Saturation  94 %     Exercise Oxygen Saturation  during 6 min  walk 87 %     Max Ex. HR 123 bpm     Max Ex. BP 122/64     2 Minute Post BP 120/60       Interval HR   1 Minute HR 84     2 Minute HR 89     3 Minute HR 97     4 Minute HR 99     5 Minute HR 89     6 Minute HR 123     2 Minute Post HR 64     Interval Heart Rate? Yes       Interval Oxygen   Interval Oxygen? Yes     Baseline Oxygen Saturation % 94 %     1 Minute Oxygen Saturation % 90 %     1 Minute Liters of Oxygen 0 L     2 Minute Oxygen Saturation % 89 %     2 Minute Liters of Oxygen 0 L     3 Minute Oxygen Saturation % 90 %     3 Minute Liters of Oxygen 0 L     4 Minute Oxygen Saturation % 89 %     4 Minute Liters of Oxygen 0 L     5 Minute Oxygen Saturation % 88 %     5 Minute Liters of Oxygen 0 L     6 Minute Oxygen Saturation % 88 %  87% @ 5:39     6 Minute Liters of Oxygen 2 L     2 Minute Post Oxygen Saturation % 96 %  2 Minute Post Liters of Oxygen 2 L              Oxygen Initial Assessment:  Oxygen Initial Assessment - 02/05/23 1337       Home Oxygen   Home Oxygen Device E-Tanks;Home Concentrator    Sleep Oxygen Prescription CPAP    Liters per minute 0    Home Exercise Oxygen Prescription Continuous    Liters per minute 2    Home Resting Oxygen Prescription None    Compliance with Home Oxygen Use Yes      Initial 6 min Walk   Oxygen Used Continuous    Liters per minute 2      Program Oxygen Prescription   Program Oxygen Prescription Continuous    Liters per minute 2      Intervention   Short Term Goals To learn and exhibit compliance with exercise, home and travel O2 prescription;To learn and understand importance of monitoring SPO2 with pulse oximeter and demonstrate accurate use of the pulse oximeter.;To learn and understand importance of maintaining oxygen saturations>88%;To learn and demonstrate proper pursed lip breathing techniques or other breathing techniques.     Long  Term Goals Exhibits compliance with exercise, home  and travel O2  prescription;Verbalizes importance of monitoring SPO2 with pulse oximeter and return demonstration;Maintenance of O2 saturations>88%;Exhibits proper breathing techniques, such as pursed lip breathing or other method taught during program session             Oxygen Re-Evaluation:  Oxygen Re-Evaluation     Row Name 02/05/23 1337             Goals/Expected Outcomes   Goals/Expected Outcomes Compliance and understanding of oxygen saturation monitoring and breathing techniques to decrease shortness of breath.                Oxygen Discharge (Final Oxygen Re-Evaluation):  Oxygen Re-Evaluation - 02/05/23 1337       Goals/Expected Outcomes   Goals/Expected Outcomes Compliance and understanding of oxygen saturation monitoring and breathing techniques to decrease shortness of breath.             Initial Exercise Prescription:  Initial Exercise Prescription - 02/05/23 1400       Date of Initial Exercise RX and Referring Provider   Date 02/05/23    Referring Provider Thomas [name]    Expected Discharge Date 05/04/23      NuStep   Level 1    SPM 92    Minutes 30    METs 1.9      Prescription Details   Frequency (times per week) 2    Duration Progress to 30 minutes of continuous aerobic without signs/symptoms of physical distress      Intensity   THRR 40-80% of Max Heartrate 59-118    Ratings of Perceived Exertion 11-13    Perceived Dyspnea 0-4      Progression   Progression Continue to progress workloads to maintain intensity without signs/symptoms of physical distress.      Resistance Training   Training Prescription Yes    Weight blue bands    Reps 10-15             Perform Capillary Blood Glucose checks as needed.  Exercise Prescription Changes:   Exercise Comments:   Exercise Goals and Review:   Exercise Goals     Row Name 02/05/23 1336             Exercise Goals   Increase Physical Activity Yes  Intervention Provide advice,  education, support and counseling about physical activity/exercise needs.;Develop an individualized exercise prescription for aerobic and resistive training based on initial evaluation findings, risk stratification, comorbidities and participant's personal goals.       Expected Outcomes Short Term: Attend rehab on a regular basis to increase amount of physical activity.;Long Term: Add in home exercise to make exercise part of routine and to increase amount of physical activity.;Long Term: Exercising regularly at least 3-5 days a week.       Increase Strength and Stamina Yes       Intervention Provide advice, education, support and counseling about physical activity/exercise needs.;Develop an individualized exercise prescription for aerobic and resistive training based on initial evaluation findings, risk stratification, comorbidities and participant's personal goals.       Expected Outcomes Short Term: Increase workloads from initial exercise prescription for resistance, speed, and METs.;Short Term: Perform resistance training exercises routinely during rehab and add in resistance training at home;Long Term: Improve cardiorespiratory fitness, muscular endurance and strength as measured by increased METs and functional capacity ( )       Able to understand and use rate of perceived exertion (RPE) scale Yes       Intervention Provide education and explanation on how to use RPE scale       Expected Outcomes Short Term: Able to use RPE daily in rehab to express subjective intensity level;Long Term:  Able to use RPE to guide intensity level when exercising independently       Able to understand and use Dyspnea scale Yes       Intervention Provide education and explanation on how to use Dyspnea scale       Expected Outcomes Short Term: Able to use Dyspnea scale daily in rehab to express subjective sense of shortness of breath during exertion;Long Term: Able to use Dyspnea scale to guide intensity level when  exercising independently       Knowledge and understanding of Target Heart Rate Range (THRR) Yes       Intervention Provide education and explanation of THRR including how the numbers were predicted and where they are located for reference       Expected Outcomes Short Term: Able to state/look up THRR;Short Term: Able to use daily as guideline for intensity in rehab;Long Term: Able to use THRR to govern intensity when exercising independently       Understanding of Exercise Prescription Yes       Intervention Provide education, explanation, and written materials on patient's individual exercise prescription       Expected Outcomes Short Term: Able to explain program exercise prescription;Long Term: Able to explain home exercise prescription to exercise independently                Exercise Goals Re-Evaluation :  Exercise Goals Re-Evaluation     Row Name 02/05/23 1446             Exercise Goal Re-Evaluation   Exercise Goals Review Increase Physical Activity;Able to understand and use Dyspnea scale;Understanding of Exercise Prescription;Increase Strength and Stamina;Knowledge and understanding of Target Heart Rate Range (THRR);Able to understand and use rate of perceived exertion (RPE) scale       Comments Tamario is scheduled to start exercise on 12/26. Will monitor and progress as able.       Expected Outcomes Through exercise at rehab and home, the patient will decrease shortness of breath with daily activites and feel confident in carrying out an exercise regimen at  home                Discharge Exercise Prescription (Final Exercise Prescription Changes):   Nutrition:  Target Goals: Understanding of nutrition guidelines, daily intake of sodium 1500mg , cholesterol 200mg , calories 30% from fat and 7% or less from saturated fats, daily to have 5 or more servings of fruits and vegetables.  Biometrics:  Pre Biometrics - 02/05/23 1348       Pre Biometrics   Grip Strength  40 kg              Nutrition Therapy Plan and Nutrition Goals:   Nutrition Assessments:  MEDIFICTS Score Key: >=70 Need to make dietary changes  40-70 Heart Healthy Diet <= 40 Therapeutic Level Cholesterol Diet   Picture Your Plate Scores: <16 Unhealthy dietary pattern with much room for improvement. 41-50 Dietary pattern unlikely to meet recommendations for good health and room for improvement. 51-60 More healthful dietary pattern, with some room for improvement.  >60 Healthy dietary pattern, although there may be some specific behaviors that could be improved.    Nutrition Goals Re-Evaluation:   Nutrition Goals Discharge (Final Nutrition Goals Re-Evaluation):   Psychosocial: Target Goals: Acknowledge presence or absence of significant depression and/or stress, maximize coping skills, provide positive support system. Participant is able to verbalize types and ability to use techniques and skills needed for reducing stress and depression.  Initial Review & Psychosocial Screening:  Initial Psych Review & Screening - 02/05/23 1340       Initial Review   Current issues with None Identified      Family Dynamics   Good Support System? Yes      Barriers   Psychosocial barriers to participate in program There are no identifiable barriers or psychosocial needs.      Screening Interventions   Interventions Encouraged to exercise    Expected Outcomes Long Term Goal: Stressors or current issues are controlled or eliminated.;Short Term goal: Identification and review with participant of any Quality of Life or Depression concerns found by scoring the questionnaire.;Long Term goal: The participant improves quality of Life and PHQ9 Scores as seen by post scores and/or verbalization of changes             Quality of Life Scores:  Scores of 19 and below usually indicate a poorer quality of life in these areas.  A difference of  2-3 points is a clinically meaningful  difference.  A difference of 2-3 points in the total score of the Quality of Life Index has been associated with significant improvement in overall quality of life, self-image, physical symptoms, and general health in studies assessing change in quality of life.  PHQ-9: Review Flowsheet  More data exists      02/05/2023 11/13/2022 11/18/2021 10/30/2021 05/19/2021  Depression screen PHQ 2/9  Decreased Interest 0 0 0 0 0  Down, Depressed, Hopeless 1 0 0 0 0  PHQ - 2 Score 1 0 0 0 0  Altered sleeping 0 - 0 - -  Tired, decreased energy 1 - 0 - -  Change in appetite 0 - 0 - -  Feeling bad or failure about yourself  0 - 0 - -  Trouble concentrating 0 - 0 - -  Moving slowly or fidgety/restless 0 - 0 - -  Suicidal thoughts 0 - 0 - -  PHQ-9 Score 2 - 0 - -  Difficult doing work/chores Somewhat difficult - Not difficult at all - -   Interpretation of Total  Score  Total Score Depression Severity:  1-4 = Minimal depression, 5-9 = Mild depression, 10-14 = Moderate depression, 15-19 = Moderately severe depression, 20-27 = Severe depression   Psychosocial Evaluation and Intervention:  Psychosocial Evaluation - 02/05/23 1341       Psychosocial Evaluation & Interventions   Interventions Encouraged to exercise with the program and follow exercise prescription    Comments [place] denies any psychosocial barriers or concerns    Expected Outcomes For Derrin to participate in PR free of barriers    Continue Psychosocial Services  No Follow up required             Psychosocial Re-Evaluation:  Psychosocial Re-Evaluation     Row Name 02/08/23 0900             Psychosocial Re-Evaluation   Current issues with None Identified       Comments Davi is scheduled to start the PR program on 12/26. No new barriers or concerns since orientation on 12/20.       Expected Outcomes For Issac to participate in PR free of any psychosocial barriers or concerns.       Interventions Encouraged to  attend Pulmonary Rehabilitation for the exercise       Continue Psychosocial Services  No Follow up required                Psychosocial Discharge (Final Psychosocial Re-Evaluation):  Psychosocial Re-Evaluation - 02/08/23 0900       Psychosocial Re-Evaluation   Current issues with None Identified    Comments Nikia is scheduled to start the PR program on 12/26. No new barriers or concerns since orientation on 12/20.    Expected Outcomes For Copeland to participate in PR free of any psychosocial barriers or concerns.    Interventions Encouraged to attend Pulmonary Rehabilitation for the exercise    Continue Psychosocial Services  No Follow up required             Education: Education Goals: Education classes will be provided on a weekly basis, covering required topics. Participant will state understanding/return demonstration of topics presented.  Learning Barriers/Preferences:  Learning Barriers/Preferences - 02/05/23 1341       Learning Barriers/Preferences   Learning Barriers None    Learning Preferences Pictoral;Group Instruction             Education Topics: Know Your Numbers Group instruction that is supported by a PowerPoint presentation. Instructor discusses importance of knowing and understanding resting, exercise, and post-exercise oxygen saturation, heart rate, and blood pressure. Oxygen saturation, heart rate, blood pressure, rating of perceived exertion, and dyspnea are reviewed along with a normal range for these values.    Exercise for the Pulmonary Patient Group instruction that is supported by a PowerPoint presentation. Instructor discusses benefits of exercise, core components of exercise, frequency, duration, and intensity of an exercise routine, importance of utilizing pulse oximetry during exercise, safety while exercising, and options of places to exercise outside of rehab.    MET Level  Group instruction provided by PowerPoint, verbal  discussion, and written material to support subject matter. Instructor reviews what METs are and how to increase METs.    Pulmonary Medications Verbally interactive group education provided by instructor with focus on inhaled medications and proper administration.   Anatomy and Physiology of the Respiratory System Group instruction provided by PowerPoint, verbal discussion, and written material to support subject matter. Instructor reviews respiratory cycle and anatomical components of the respiratory system and their functions. Instructor also  reviews differences in obstructive and restrictive respiratory diseases with examples of each.    Oxygen Safety Group instruction provided by PowerPoint, verbal discussion, and written material to support subject matter. There is an overview of "What is Oxygen" and "Why do we need it".  Instructor also reviews how to create a safe environment for oxygen use, the importance of using oxygen as prescribed, and the risks of noncompliance. There is a brief discussion on traveling with oxygen and resources the patient may utilize.   Oxygen Use Group instruction provided by PowerPoint, verbal discussion, and written material to discuss how supplemental oxygen is prescribed and different types of oxygen supply systems. Resources for more information are provided.    Breathing Techniques Group instruction that is supported by demonstration and informational handouts. Instructor discusses the benefits of pursed lip and diaphragmatic breathing and detailed demonstration on how to perform both.     Risk Factor Reduction Group instruction that is supported by a PowerPoint presentation. Instructor discusses the definition of a risk factor, different risk factors for pulmonary disease, and how the heart and lungs work together.   Pulmonary Diseases Group instruction provided by PowerPoint, verbal discussion, and written material to support subject matter.  Instructor gives an overview of the different type of pulmonary diseases. There is also a discussion on risk factors and symptoms as well as ways to manage the diseases.   Stress and Energy Conservation Group instruction provided by PowerPoint, verbal discussion, and written material to support subject matter. Instructor gives an overview of stress and the impact it can have on the body. Instructor also reviews ways to reduce stress. There is also a discussion on energy conservation and ways to conserve energy throughout the day.   Warning Signs and Symptoms Group instruction provided by PowerPoint, verbal discussion, and written material to support subject matter. Instructor reviews warning signs and symptoms of stroke, heart attack, cold and flu. Instructor also reviews ways to prevent the spread of infection.   Other Education Group or individual verbal, written, or video instructions that support the educational goals of the pulmonary rehab program.    Knowledge Questionnaire Score:  Knowledge Questionnaire Score - 02/05/23 1453       Knowledge Questionnaire Score   Pre Score 11/18             Core Components/Risk Factors/Patient Goals at Admission:  Personal Goals and Risk Factors at Admission - 02/05/23 1342       Core Components/Risk Factors/Patient Goals on Admission   Improve shortness of breath with ADL's Yes    Intervention Provide education, individualized exercise plan and daily activity instruction to help decrease symptoms of SOB with activities of daily living.    Expected Outcomes Short Term: Improve cardiorespiratory fitness to achieve a reduction of symptoms when performing ADLs;Long Term: Be able to perform more ADLs without symptoms or delay the onset of symptoms    Heart Failure Yes    Intervention Provide a combined exercise and nutrition program that is supplemented with education, support and counseling about heart failure. Directed toward relieving  symptoms such as shortness of breath, decreased exercise tolerance, and extremity edema.    Expected Outcomes Improve functional capacity of life;Short term: Attendance in program 2-3 days a week with increased exercise capacity. Reported lower sodium intake. Reported increased fruit and vegetable intake. Reports medication compliance.;Short term: Daily weights obtained and reported for increase. Utilizing diuretic protocols set by physician.;Long term: Adoption of self-care skills and reduction of barriers for early  signs and symptoms recognition and intervention leading to self-care maintenance.             Core Components/Risk Factors/Patient Goals Review:   Goals and Risk Factor Review     Row Name 02/08/23 0902             Core Components/Risk Factors/Patient Goals Review   Personal Goals Review Improve shortness of breath with ADL's;Develop more efficient breathing techniques such as purse lipped breathing and diaphragmatic breathing and practicing self-pacing with activity.;Heart Failure       Review Demaryius is scheduled to start the PR program on 12/26. We will continue to monitor his progress toward his goals throughout the program.       Expected Outcomes See admission goal                Core Components/Risk Factors/Patient Goals at Discharge (Final Review):   Goals and Risk Factor Review - 02/08/23 0902       Core Components/Risk Factors/Patient Goals Review   Personal Goals Review Improve shortness of breath with ADL's;Develop more efficient breathing techniques such as purse lipped breathing and diaphragmatic breathing and practicing self-pacing with activity.;Heart Failure    Review Ioanis is scheduled to start the PR program on 12/26. We will continue to monitor his progress toward his goals throughout the program.    Expected Outcomes See admission goal             ITP Comments: Pt is scheduled to start exercise 12/26.   Dr. Mechele Collin is Medical  Director for Pulmonary Rehab at [facility].

## 2023-02-09 NOTE — Progress Notes (Deleted)
Pulmonary Individual Treatment Plan  Patient Details  Name: Thomas [name] MRN: [id number] Date of Birth: [birth year] Referring Provider:   Doristine Devoid Pulmonary Rehab Walk Test from 02/05/2023 in [facility]  Referring Provider Sood       Initial Encounter Date:  Flowsheet Row Pulmonary Rehab Walk Test from 02/05/2023 in [facility]  Date 02/05/23       Visit Diagnosis: Chronic combined systolic (congestive) and diastolic (congestive) heart failure (HCC)  Patient's Home Medications on Admission:   Current Outpatient Medications:    Accu-Chek Softclix Lancets lancets, Use to test blood sugar daily, Disp: 200 each, Rfl: 3   acetaminophen (TYLENOL) 500 MG tablet, Take by mouth., Disp: , Rfl:    Alcohol Swabs (B-D SINGLE USE SWABS REGULAR) PADS, Use in testing blood sugar daily. Dx: E11.22, Disp: 100 each, Rfl: 3   amLODipine (NORVASC) 10 MG tablet, TAKE 1 TABLET EVERY DAY FOR HIGH BLOOD PRESSURE, Disp: 90 tablet, Rfl: 1   aspirin EC 81 MG tablet, Take 81 mg by mouth daily., Disp: , Rfl:    atorvastatin (LIPITOR) 40 MG tablet, TAKE 1 TABLET EVERY DAY, Disp: 90 tablet, Rfl: 3   Blood Glucose Calibration (ACCU-CHEK AVIVA) SOLN, Use once daily as directed dx E11.22, Disp: 1 each, Rfl: 2   Blood Glucose Monitoring Suppl (TRUE METRIX AIR GLUCOSE METER) w/Device KIT, 1 Device by Does not apply route daily as needed. E11.22, Disp: 1 kit, Rfl: 0   carvedilol (COREG) 12.5 MG tablet, TAKE 1 TABLET TWICE DAILY WITH MEALS, Disp: 180 tablet, Rfl: 3   Cholecalciferol (VITAMIN D3) 50 MCG (2000 UT) TABS, Take 1 tablet (2,000 Units) by mouth daily., Disp: 10 tablet, Rfl: 0   empagliflozin (JARDIANCE) 10 MG TABS tablet, Take 1 tablet (10 mg total) by mouth daily., Disp: 30 tablet, Rfl: 0   furosemide (LASIX) 20 MG tablet, TAKE 1 TABLET AS DIRECTED AS NEEDED, Disp: 90 tablet, Rfl:  3   glucose blood (ACCU-CHEK AVIVA PLUS) test strip, Use to test blood sugar daily., Disp: 100 each, Rfl: 3   hydrALAZINE (APRESOLINE) 25 MG tablet, TAKE 3 TABLETS THREE TIMES DAILY, Disp: 810 tablet, Rfl: 3   Lancets Misc. (ACCU-CHEK SOFTCLIX LANCET DEV) KIT, Use to test blood sugar daily, Disp: 1 kit, Rfl: 0   losartan (COZAAR) 50 MG tablet, TAKE 1 TABLET EVERY DAY, Disp: 90 tablet, Rfl: 3   metFORMIN (GLUCOPHAGE) 500 MG tablet, TAKE 1 TABLET TWICE DAILY WITH MEALS, Disp: 180 tablet, Rfl: 3   potassium chloride SA (KLOR-CON M) 20 MEQ tablet, TAKE 1 TABLET TWICE DAILY, Disp: 180 tablet, Rfl: 3   rivaroxaban (XARELTO) 20 MG TABS tablet, Take 1 tablet (20 mg total) by mouth daily with supper., Disp: 30 tablet, Rfl: 12   Senna 8.7 MG CHEW, Chew 1 tablet by mouth as needed., Disp: 90 tablet, Rfl: 1   spironolactone (ALDACTONE) 50 MG tablet, Take 50 mg by mouth daily., Disp: 90 tablet, Rfl: 3   tirzepatide (MOUNJARO) 15 MG/0.5ML Pen, Inject 15 mg into the skin once a week., Disp: 2 mL, Rfl: 11   traMADol (ULTRAM) 50 MG tablet, Take 1 tablet (50 mg total) by mouth every 6 (six) hours as needed for pain, Disp: 30 tablet, Rfl: 0   TRUEplus Lancets 28G MISC, TEST BLOOD SUGAR EVERY DAY AS NEEDED, Disp: 100 each, Rfl: 3   zinc gluconate 50 MG tablet, Take  1 tablet (50 mg total) by mouth daily., Disp: 14 tablet, Rfl: 0  Past Medical History: Past Medical History:  Diagnosis Date   Acute on chronic diastolic CHF (congestive heart failure) (HCC) 06/01/2017   Arthritis    Cancer (HCC) 2010   Prostate   Chronic kidney disease    Diabetes mellitus    Diabetic neuropathy (HCC)    feet   DVT (deep venous thrombosis) (HCC)    Genetic testing 06/22/2016   Thomas [name] underwent genetic counseling and testing for hereditary cancer syndromes on 05/14/2016. His results were negative for mutations in all 46 genes analyzed by [facility]'s 46-gene Common Hereditary Cancers Panel. Genes analyzed include: APC, ATM, AXIN2,  BARD1, BMPR1A, BRCA1, BRCA2, BRIP1, CDH1, CDKN2A, CHEK2, CTNNA1, DICER1, EPCAM, GREM1, HOXB13, KIT, MEN1, MLH1, MSH2, MSH3, MSH6, MUTYH, NB   GERD (gastroesophageal reflux disease)    Hypertension    PE (pulmonary thromboembolism) (HCC)    Pneumonia    Sleep apnea    not wearing CPAP   Suprapubic catheter (HCC) 11/17/2019    Tobacco Use: Social History   Tobacco Use  Smoking Status Never   Passive exposure: Never  Smokeless Tobacco Never    Labs: Review Flowsheet  More data exists      Latest Ref Rng & Units 05/19/2021 09/29/2021 03/30/2022 07/03/2022 11/03/2022  Labs for ITP Cardiac and Pulmonary Rehab  Cholestrol 100 - 199 mg/dL 161  - 096  - 045   LDL (calc) 0 - 99 mg/dL 47  - 62  - 48   HDL-C >39 mg/dL 52  - 58  - 46   Trlycerides 0 - 149 mg/dL 65  - 84  - 75   Hemoglobin A1c 4.8 - 5.6 % 6.1  7.2  8.6  6.5  5.6     Capillary Blood Glucose: Lab Results  Component Value Date   GLUCAP 180 (H) 10/20/2018   GLUCAP 200 (H) 07/06/2017   GLUCAP 216 (H) 06/03/2017   GLUCAP 230 (H) 06/03/2017   GLUCAP 225 (H) 06/02/2017     Pulmonary Assessment Scores:  Pulmonary Assessment Scores     Row Name 02/05/23 1447         ADL UCSD   ADL Phase Entry     SOB Score total 66       CAT Score   CAT Score 22       mMRC Score   mMRC Score 4             UCSD: Self-administered rating of dyspnea associated with activities of daily living (ADLs) 6-point scale (0 = "not at all" to 5 = "maximal or unable to do because of breathlessness")  Scoring Scores range from 0 to 120.  Minimally important difference is 5 units  CAT: CAT can identify the health impairment of COPD patients and is better correlated with disease progression.  CAT has a scoring range of zero to 40. The CAT score is classified into four groups of low (less than 10), medium (10 - 20), high (21-30) and very high (31-40) based on the impact level of disease on health status. A CAT score over 10 suggests  significant symptoms.  A worsening CAT score could be explained by an exacerbation, poor medication adherence, poor inhaler technique, or progression of COPD or comorbid conditions.  CAT MCID is 2 points  mMRC: mMRC (Modified Medical Research Council) Dyspnea Scale is used to assess the degree of baseline functional disability in patients of respiratory disease due  to dyspnea. No minimal important difference is established. A decrease in score of 1 point or greater is considered a positive change.   Pulmonary Function Assessment:  Pulmonary Function Assessment - 02/05/23 1338       Breath   Bilateral Breath Sounds Clear    Shortness of Breath Yes;Limiting activity             Exercise Target Goals: Exercise Program Goal: Individual exercise prescription set using results from initial 6 min walk test and THRR while considering  patient's activity barriers and safety.   Exercise Prescription Goal: Initial exercise prescription builds to 30-45 minutes a day of aerobic activity, 2-3 days per week.  Home exercise guidelines will be given to patient during program as part of exercise prescription that the participant will acknowledge.  Activity Barriers & Risk Stratification:  Activity Barriers & Cardiac Risk Stratification - 02/05/23 1336       Activity Barriers & Cardiac Risk Stratification   Activity Barriers Right Knee Replacement;Left Knee Replacement;Muscular Weakness;Deconditioning;Joint Problems;Balance Concerns;Shortness of Breath;Assistive Device             6 Minute Walk:  6 Minute Walk     Row Name 02/05/23 1438         6 Minute Walk   Phase Initial  Nustep test     Distance 1584 feet     Walk Time 6 minutes     # of Rest Breaks 0     MPH 3     METS 2.61     RPE 11     Perceived Dyspnea  1     VO2 Peak 9.13     Symptoms No     Resting HR 61 bpm     Resting BP 122/62     Resting Oxygen Saturation  94 %     Exercise Oxygen Saturation  during 6 min  walk 87 %     Max Ex. HR 123 bpm     Max Ex. BP 122/64     2 Minute Post BP 120/60       Interval HR   1 Minute HR 84     2 Minute HR 89     3 Minute HR 97     4 Minute HR 99     5 Minute HR 89     6 Minute HR 123     2 Minute Post HR 64     Interval Heart Rate? Yes       Interval Oxygen   Interval Oxygen? Yes     Baseline Oxygen Saturation % 94 %     1 Minute Oxygen Saturation % 90 %     1 Minute Liters of Oxygen 0 L     2 Minute Oxygen Saturation % 89 %     2 Minute Liters of Oxygen 0 L     3 Minute Oxygen Saturation % 90 %     3 Minute Liters of Oxygen 0 L     4 Minute Oxygen Saturation % 89 %     4 Minute Liters of Oxygen 0 L     5 Minute Oxygen Saturation % 88 %     5 Minute Liters of Oxygen 0 L     6 Minute Oxygen Saturation % 88 %  87% @ 5:39     6 Minute Liters of Oxygen 2 L     2 Minute Post Oxygen Saturation % 96 %  2 Minute Post Liters of Oxygen 2 L              Oxygen Initial Assessment:  Oxygen Initial Assessment - 02/05/23 1337       Home Oxygen   Home Oxygen Device E-Tanks;Home Concentrator    Sleep Oxygen Prescription CPAP    Liters per minute 0    Home Exercise Oxygen Prescription Continuous    Liters per minute 2    Home Resting Oxygen Prescription None    Compliance with Home Oxygen Use Yes      Initial 6 min Walk   Oxygen Used Continuous    Liters per minute 2      Program Oxygen Prescription   Program Oxygen Prescription Continuous    Liters per minute 2      Intervention   Short Term Goals To learn and exhibit compliance with exercise, home and travel O2 prescription;To learn and understand importance of monitoring SPO2 with pulse oximeter and demonstrate accurate use of the pulse oximeter.;To learn and understand importance of maintaining oxygen saturations>88%;To learn and demonstrate proper pursed lip breathing techniques or other breathing techniques.     Long  Term Goals Exhibits compliance with exercise, home  and travel O2  prescription;Verbalizes importance of monitoring SPO2 with pulse oximeter and return demonstration;Maintenance of O2 saturations>88%;Exhibits proper breathing techniques, such as pursed lip breathing or other method taught during program session             Oxygen Re-Evaluation:  Oxygen Re-Evaluation     Row Name 02/05/23 1337             Goals/Expected Outcomes   Goals/Expected Outcomes Compliance and understanding of oxygen saturation monitoring and breathing techniques to decrease shortness of breath.                Oxygen Discharge (Final Oxygen Re-Evaluation):  Oxygen Re-Evaluation - 02/05/23 1337       Goals/Expected Outcomes   Goals/Expected Outcomes Compliance and understanding of oxygen saturation monitoring and breathing techniques to decrease shortness of breath.             Initial Exercise Prescription:  Initial Exercise Prescription - 02/05/23 1400       Date of Initial Exercise RX and Referring Provider   Date 02/05/23    Referring Provider Sood    Expected Discharge Date 05/04/23      NuStep   Level 1    SPM 92    Minutes 30    METs 1.9      Prescription Details   Frequency (times per week) 2    Duration Progress to 30 minutes of continuous aerobic without signs/symptoms of physical distress      Intensity   THRR 40-80% of Max Heartrate 59-118    Ratings of Perceived Exertion 11-13    Perceived Dyspnea 0-4      Progression   Progression Continue to progress workloads to maintain intensity without signs/symptoms of physical distress.      Resistance Training   Training Prescription Yes    Weight blue bands    Reps 10-15             Perform Capillary Blood Glucose checks as needed.  Exercise Prescription Changes:   Exercise Comments:   Exercise Goals and Review:   Exercise Goals     Row Name 02/05/23 1336             Exercise Goals   Increase Physical Activity Yes  Intervention Provide advice,  education, support and counseling about physical activity/exercise needs.;Develop an individualized exercise prescription for aerobic and resistive training based on initial evaluation findings, risk stratification, comorbidities and participant's personal goals.       Expected Outcomes Short Term: Attend rehab on a regular basis to increase amount of physical activity.;Long Term: Add in home exercise to make exercise part of routine and to increase amount of physical activity.;Long Term: Exercising regularly at least 3-5 days a week.       Increase Strength and Stamina Yes       Intervention Provide advice, education, support and counseling about physical activity/exercise needs.;Develop an individualized exercise prescription for aerobic and resistive training based on initial evaluation findings, risk stratification, comorbidities and participant's personal goals.       Expected Outcomes Short Term: Increase workloads from initial exercise prescription for resistance, speed, and METs.;Short Term: Perform resistance training exercises routinely during rehab and add in resistance training at home;Long Term: Improve cardiorespiratory fitness, muscular endurance and strength as measured by increased METs and functional capacity ( )       Able to understand and use rate of perceived exertion (RPE) scale Yes       Intervention Provide education and explanation on how to use RPE scale       Expected Outcomes Short Term: Able to use RPE daily in rehab to express subjective intensity level;Long Term:  Able to use RPE to guide intensity level when exercising independently       Able to understand and use Dyspnea scale Yes       Intervention Provide education and explanation on how to use Dyspnea scale       Expected Outcomes Short Term: Able to use Dyspnea scale daily in rehab to express subjective sense of shortness of breath during exertion;Long Term: Able to use Dyspnea scale to guide intensity level when  exercising independently       Knowledge and understanding of Target Heart Rate Range (THRR) Yes       Intervention Provide education and explanation of THRR including how the numbers were predicted and where they are located for reference       Expected Outcomes Short Term: Able to state/look up THRR;Short Term: Able to use daily as guideline for intensity in rehab;Long Term: Able to use THRR to govern intensity when exercising independently       Understanding of Exercise Prescription Yes       Intervention Provide education, explanation, and written materials on patient's individual exercise prescription       Expected Outcomes Short Term: Able to explain program exercise prescription;Long Term: Able to explain home exercise prescription to exercise independently                Exercise Goals Re-Evaluation :  Exercise Goals Re-Evaluation     Row Name 02/05/23 1446             Exercise Goal Re-Evaluation   Exercise Goals Review Increase Physical Activity;Able to understand and use Dyspnea scale;Understanding of Exercise Prescription;Increase Strength and Stamina;Knowledge and understanding of Target Heart Rate Range (THRR);Able to understand and use rate of perceived exertion (RPE) scale       Comments Thomas [name] is scheduled to start exercise on 12/26. Will monitor and progress as able.       Expected Outcomes Through exercise at rehab and home, the patient will decrease shortness of breath with daily activites and feel confident in carrying out an exercise regimen at  home                Discharge Exercise Prescription (Final Exercise Prescription Changes):   Nutrition:  Target Goals: Understanding of nutrition guidelines, daily intake of sodium 1500mg , cholesterol 200mg , calories 30% from fat and 7% or less from saturated fats, daily to have 5 or more servings of fruits and vegetables.  Biometrics:  Pre Biometrics - 02/05/23 1348       Pre Biometrics   Grip Strength  40 kg              Nutrition Therapy Plan and Nutrition Goals:   Nutrition Assessments:  MEDIFICTS Score Key: >=70 Need to make dietary changes  40-70 Heart Healthy Diet <= 40 Therapeutic Level Cholesterol Diet   Picture Your Plate Scores: <51 Unhealthy dietary pattern with much room for improvement. 41-50 Dietary pattern unlikely to meet recommendations for good health and room for improvement. 51-60 More healthful dietary pattern, with some room for improvement.  >60 Healthy dietary pattern, although there may be some specific behaviors that could be improved.    Nutrition Goals Re-Evaluation:   Nutrition Goals Discharge (Final Nutrition Goals Re-Evaluation):   Psychosocial: Target Goals: Acknowledge presence or absence of significant depression and/or stress, maximize coping skills, provide positive support system. Participant is able to verbalize types and ability to use techniques and skills needed for reducing stress and depression.  Initial Review & Psychosocial Screening:  Initial Psych Review & Screening - 02/05/23 1340       Initial Review   Current issues with None Identified      Family Dynamics   Good Support System? Yes      Barriers   Psychosocial barriers to participate in program There are no identifiable barriers or psychosocial needs.      Screening Interventions   Interventions Encouraged to exercise    Expected Outcomes Long Term Goal: Stressors or current issues are controlled or eliminated.;Short Term goal: Identification and review with participant of any Quality of Life or Depression concerns found by scoring the questionnaire.;Long Term goal: The participant improves quality of Life and PHQ9 Scores as seen by post scores and/or verbalization of changes             Quality of Life Scores:  Scores of 19 and below usually indicate a poorer quality of life in these areas.  A difference of  2-3 points is a clinically meaningful  difference.  A difference of 2-3 points in the total score of the Quality of Life Index has been associated with significant improvement in overall quality of life, self-image, physical symptoms, and general health in studies assessing change in quality of life.  PHQ-9: Review Flowsheet  More data exists      02/05/2023 11/13/2022 11/18/2021 10/30/2021 05/19/2021  Depression screen PHQ 2/9  Decreased Interest 0 0 0 0 0  Down, Depressed, Hopeless 1 0 0 0 0  PHQ - 2 Score 1 0 0 0 0  Altered sleeping 0 - 0 - -  Tired, decreased energy 1 - 0 - -  Change in appetite 0 - 0 - -  Feeling bad or failure about yourself  0 - 0 - -  Trouble concentrating 0 - 0 - -  Moving slowly or fidgety/restless 0 - 0 - -  Suicidal thoughts 0 - 0 - -  PHQ-9 Score 2 - 0 - -  Difficult doing work/chores Somewhat difficult - Not difficult at all - -   Interpretation of Total  Score  Total Score Depression Severity:  1-4 = Minimal depression, 5-9 = Mild depression, 10-14 = Moderate depression, 15-19 = Moderately severe depression, 20-27 = Severe depression   Psychosocial Evaluation and Intervention:  Psychosocial Evaluation - 02/05/23 1341       Psychosocial Evaluation & Interventions   Interventions Encouraged to exercise with the program and follow exercise prescription    Comments Thomas [name] denies any psychosocial barriers or concerns    Expected Outcomes For Thomas [name] to participate in PR free of barriers    Continue Psychosocial Services  No Follow up required             Psychosocial Re-Evaluation:  Psychosocial Re-Evaluation     Row Name 02/08/23 0900             Psychosocial Re-Evaluation   Current issues with None Identified       Comments Thomas [name] is scheduled to start the PR program on 12/26. No new barriers or concerns since orientation on 12/20.       Expected Outcomes For Thomas [name] to participate in PR free of any psychosocial barriers or concerns.       Interventions Encouraged to  attend Pulmonary Rehabilitation for the exercise       Continue Psychosocial Services  No Follow up required                Psychosocial Discharge (Final Psychosocial Re-Evaluation):  Psychosocial Re-Evaluation - 02/08/23 0900       Psychosocial Re-Evaluation   Current issues with None Identified    Comments Thomas [name] is scheduled to start the PR program on 12/26. No new barriers or concerns since orientation on 12/20.    Expected Outcomes For Amiere to participate in PR free of any psychosocial barriers or concerns.    Interventions Encouraged to attend Pulmonary Rehabilitation for the exercise    Continue Psychosocial Services  No Follow up required             Education: Education Goals: Education classes will be provided on a weekly basis, covering required topics. Participant will state understanding/return demonstration of topics presented.  Learning Barriers/Preferences:  Learning Barriers/Preferences - 02/05/23 1341       Learning Barriers/Preferences   Learning Barriers None    Learning Preferences Pictoral;Group Instruction             Education Topics: Know Your Numbers Group instruction that is supported by a PowerPoint presentation. Instructor discusses importance of knowing and understanding resting, exercise, and post-exercise oxygen saturation, heart rate, and blood pressure. Oxygen saturation, heart rate, blood pressure, rating of perceived exertion, and dyspnea are reviewed along with a normal range for these values.    Exercise for the Pulmonary Patient Group instruction that is supported by a PowerPoint presentation. Instructor discusses benefits of exercise, core components of exercise, frequency, duration, and intensity of an exercise routine, importance of utilizing pulse oximetry during exercise, safety while exercising, and options of places to exercise outside of rehab.    MET Level  Group instruction provided by PowerPoint, verbal  discussion, and written material to support subject matter. Instructor reviews what METs are and how to increase METs.    Pulmonary Medications Verbally interactive group education provided by instructor with focus on inhaled medications and proper administration.   Anatomy and Physiology of the Respiratory System Group instruction provided by PowerPoint, verbal discussion, and written material to support subject matter. Instructor reviews respiratory cycle and anatomical components of the respiratory system and their functions. Instructor also  reviews differences in obstructive and restrictive respiratory diseases with examples of each.    Oxygen Safety Group instruction provided by PowerPoint, verbal discussion, and written material to support subject matter. There is an overview of "What is Oxygen" and "Why do we need it".  Instructor also reviews how to create a safe environment for oxygen use, the importance of using oxygen as prescribed, and the risks of noncompliance. There is a brief discussion on traveling with oxygen and resources the patient may utilize.   Oxygen Use Group instruction provided by PowerPoint, verbal discussion, and written material to discuss how supplemental oxygen is prescribed and different types of oxygen supply systems. Resources for more information are provided.    Breathing Techniques Group instruction that is supported by demonstration and informational handouts. Instructor discusses the benefits of pursed lip and diaphragmatic breathing and detailed demonstration on how to perform both.     Risk Factor Reduction Group instruction that is supported by a PowerPoint presentation. Instructor discusses the definition of a risk factor, different risk factors for pulmonary disease, and how the heart and lungs work together.   Pulmonary Diseases Group instruction provided by PowerPoint, verbal discussion, and written material to support subject matter.  Instructor gives an overview of the different type of pulmonary diseases. There is also a discussion on risk factors and symptoms as well as ways to manage the diseases.   Stress and Energy Conservation Group instruction provided by PowerPoint, verbal discussion, and written material to support subject matter. Instructor gives an overview of stress and the impact it can have on the body. Instructor also reviews ways to reduce stress. There is also a discussion on energy conservation and ways to conserve energy throughout the day.   Warning Signs and Symptoms Group instruction provided by PowerPoint, verbal discussion, and written material to support subject matter. Instructor reviews warning signs and symptoms of stroke, heart attack, cold and flu. Instructor also reviews ways to prevent the spread of infection.   Other Education Group or individual verbal, written, or video instructions that support the educational goals of the pulmonary rehab program.    Knowledge Questionnaire Score:  Knowledge Questionnaire Score - 02/05/23 1453       Knowledge Questionnaire Score   Pre Score 11/18             Core Components/Risk Factors/Patient Goals at Admission:  Personal Goals and Risk Factors at Admission - 02/05/23 1342       Core Components/Risk Factors/Patient Goals on Admission   Improve shortness of breath with ADL's Yes    Intervention Provide education, individualized exercise plan and daily activity instruction to help decrease symptoms of SOB with activities of daily living.    Expected Outcomes Short Term: Improve cardiorespiratory fitness to achieve a reduction of symptoms when performing ADLs;Long Term: Be able to perform more ADLs without symptoms or delay the onset of symptoms    Heart Failure Yes    Intervention Provide a combined exercise and nutrition program that is supplemented with education, support and counseling about heart failure. Directed toward relieving  symptoms such as shortness of breath, decreased exercise tolerance, and extremity edema.    Expected Outcomes Improve functional capacity of life;Short term: Attendance in program 2-3 days a week with increased exercise capacity. Reported lower sodium intake. Reported increased fruit and vegetable intake. Reports medication compliance.;Short term: Daily weights obtained and reported for increase. Utilizing diuretic protocols set by physician.;Long term: Adoption of self-care skills and reduction of barriers for early  signs and symptoms recognition and intervention leading to self-care maintenance.             Core Components/Risk Factors/Patient Goals Review:   Goals and Risk Factor Review     Row Name 02/08/23 0902             Core Components/Risk Factors/Patient Goals Review   Personal Goals Review Improve shortness of breath with ADL's;Develop more efficient breathing techniques such as purse lipped breathing and diaphragmatic breathing and practicing self-pacing with activity.;Heart Failure       Review Thomas [name] is scheduled to start the PR program on 12/26. We will continue to monitor his progress toward his goals throughout the program.       Expected Outcomes See admission goal                Core Components/Risk Factors/Patient Goals at Discharge (Final Review):   Goals and Risk Factor Review - 02/08/23 0902       Core Components/Risk Factors/Patient Goals Review   Personal Goals Review Improve shortness of breath with ADL's;Develop more efficient breathing techniques such as purse lipped breathing and diaphragmatic breathing and practicing self-pacing with activity.;Heart Failure    Review Thomas [name] is scheduled to start the PR program on 12/26. We will continue to monitor his progress toward his goals throughout the program.    Expected Outcomes See admission goal             ITP Comments:Pt is making expected progress toward Pulmonary Rehab goals after  completing 0 session(s). Recommend continued exercise, life style modification, education, and utilization of breathing techniques to increase stamina and strength, while also decreasing shortness of breath with exertion.  Dr. Mechele Collin is Medical Director for Pulmonary Rehab at [facility].

## 2023-02-11 ENCOUNTER — Ambulatory Visit: Payer: Self-pay

## 2023-02-11 ENCOUNTER — Encounter (HOSPITAL_COMMUNITY)
Admission: RE | Admit: 2023-02-11 | Discharge: 2023-02-11 | Disposition: A | Discharge status: Home / Self Care | Payer: Medicare HMO | Source: Ambulatory Visit | Attending: Pulmonary Disease | Admitting: Pulmonary Disease

## 2023-02-11 DIAGNOSIS — I5042 Chronic combined systolic (congestive) and diastolic (congestive) heart failure: Secondary | ICD-10-CM | POA: Diagnosis not present

## 2023-02-11 LAB — GLUCOSE, CAPILLARY
Glucose-Capillary: 203 mg/dL — ABNORMAL HIGH (ref 70–99)
Glucose-Capillary: 214 mg/dL — ABNORMAL HIGH (ref 70–99)

## 2023-02-11 NOTE — Progress Notes (Signed)
Daily Session Note  Patient Details  Name: Thomas [name] MRN: [id number] Date of Birth: [birth year] Referring Provider:   Doristine Devoid Pulmonary Rehab Walk Test from 02/05/2023 in [facility] for Heart, Vascular, & Lung Health  Referring Provider Sood       Encounter Date: 02/11/2023  Check In:  Session Check In - 02/11/23 0953       Check-In   Supervising physician immediately available to respond to emergencies [facility] MD immediately available    Physician(s) Reather Littler, NP    Location [facility]    Staff Present Essie Hart, RN, BSN;Apostolos Blagg BS, ACSM-CEP, Exercise Physiologist;Casey Katrinka Blazing, RT    Virtual Visit No    Medication changes reported     No    Fall or balance concerns reported    Yes    Comments uses wheelchair, cane, and walking stick    Tobacco Cessation No Change    Warm-up and Cool-down Not performed (comment)   only   Resistance Training Performed No    VAD Patient? No    PAD/SET Patient? No      Pain Assessment   Currently in Pain? No/denies    Multiple Pain Sites No             Capillary Blood Glucose: Results for orders placed or performed during the hospital encounter of 02/11/23 (from the past 24 hours)  Glucose, capillary     Status: Abnormal   Collection Time: 02/11/23 10:06 AM  Result Value Ref Range   Glucose-Capillary 203 (H) 70 - 99 mg/dL  Glucose, capillary     Status: Abnormal   Collection Time: 02/11/23 11:39 AM  Result Value Ref Range   Glucose-Capillary 214 (H) 70 - 99 mg/dL      Social History   Tobacco Use  Smoking Status Never   Passive exposure: Never  Smokeless Tobacco Never    Goals Met:  Exercise tolerated well No report of concerns or symptoms today Strength training completed today  Goals Unmet:  Not Applicable  Comments: Completed first day of exercise. Service time is from 1010 to 1200.    Dr. Mechele Collin is Medical Director for Pulmonary  Rehab at [facility].

## 2023-02-11 NOTE — Patient Outreach (Signed)
  Care Coordination   02/11/2023 Name: Thomas [name] MRN: [id number] DOB: May 28, 1950   Care Coordination Outreach Attempts:  An unsuccessful outreach was attempted for an appointment today.  Follow Up Plan:  Additional outreach attempts will be made to offer the patient complex care management information and services.   Encounter Outcome:  Patient Request to Call Back   Care Coordination Interventions:  No, not indicated    [name] RN BSN CCM [place]  [facility], [facility] Nurse Care Coordinator  Direct Dial: [phone] Website: [email or web address]

## 2023-02-13 ENCOUNTER — Other Ambulatory Visit: Payer: Self-pay | Admitting: Nurse Practitioner

## 2023-02-13 DIAGNOSIS — E1122 Type 2 diabetes mellitus with diabetic chronic kidney disease: Secondary | ICD-10-CM

## 2023-02-13 DIAGNOSIS — I509 Heart failure, unspecified: Secondary | ICD-10-CM

## 2023-02-16 ENCOUNTER — Encounter (HOSPITAL_COMMUNITY)
Admission: RE | Admit: 2023-02-16 | Discharge: 2023-02-16 | Disposition: A | Discharge status: Home / Self Care | Payer: Medicare HMO | Source: Ambulatory Visit | Attending: Pulmonary Disease | Admitting: Pulmonary Disease

## 2023-02-16 DIAGNOSIS — I5042 Chronic combined systolic (congestive) and diastolic (congestive) heart failure: Secondary | ICD-10-CM

## 2023-02-16 LAB — GLUCOSE, CAPILLARY
Glucose-Capillary: 211 mg/dL — ABNORMAL HIGH (ref 70–99)
Glucose-Capillary: 206 mg/dL — ABNORMAL HIGH (ref 70–99)

## 2023-02-16 NOTE — Progress Notes (Signed)
 Daily Session Note  Patient Details  Name: Thomas [name] MRN: [id number] Date of Birth: [birth year] Referring Provider:   Conrad Ports Pulmonary Rehab Walk Test from 02/05/2023 in [facility] for Heart, Vascular, & Lung Health  Referring Provider Sood       Encounter Date: 02/16/2023  Check In:  Session Check In - 02/16/23 1021       Check-In   Supervising physician immediately available to respond to emergencies [facility] MD immediately available    Physician(s) Orren Fabry, NP    [facility]    Staff Present Ronal Levin, RN, BSN;Randi Reeve BS, ACSM-CEP, Exercise Physiologist;Casey Claudene, RT    Virtual Visit No    Medication changes reported     No    Fall or balance concerns reported    Yes    Comments uses wheelchair, cane, and walking stick    Tobacco Cessation No Change    Warm-up and Cool-down Performed as group-led instruction   only   Resistance Training Performed Yes    VAD Patient? No    PAD/SET Patient? No      Pain Assessment   Currently in Pain? No/denies    Multiple Pain Sites No             Capillary Blood Glucose: Results for orders placed or performed during the hospital encounter of 02/16/23 (from the past 24 hours)  Glucose, capillary     Status: Abnormal   Collection Time: 02/16/23 11:30 AM  Result Value Ref Range   Glucose-Capillary 206 (H) 70 - 99 mg/dL      Social History   Tobacco Use  Smoking Status Never   Passive exposure: Never  Smokeless Tobacco Never    Goals Met:  Independence with exercise equipment Exercise tolerated well No report of concerns or symptoms today Strength training completed today  Goals Unmet:  Not Applicable  Comments: Service time is from 1005 to     Dr. Slater Staff is Medical Director for Pulmonary Rehab at [facility].

## 2023-02-18 ENCOUNTER — Encounter (HOSPITAL_COMMUNITY)
Admission: RE | Admit: 2023-02-18 | Discharge: 2023-02-18 | Disposition: A | Discharge status: Home / Self Care | Payer: Medicare HMO | Source: Ambulatory Visit | Attending: Pulmonary Disease | Admitting: Pulmonary Disease

## 2023-02-18 DIAGNOSIS — I5042 Chronic combined systolic (congestive) and diastolic (congestive) heart failure: Secondary | ICD-10-CM | POA: Diagnosis not present

## 2023-02-18 NOTE — Progress Notes (Signed)
 Daily Session Note  Patient Details  Name: Thomas [name] MRN: [id number] Date of Birth: 08/30/1950 Referring Provider:   Conrad Ports Pulmonary Rehab Walk Test from 02/05/2023 in [facility] for Heart, Vascular, & Lung Health  Referring Provider Sood       Encounter Date: 02/18/2023  Check In:  Session Check In - 02/18/23 1108       Check-In   Supervising physician immediately available to respond to emergencies [facility] MD immediately available    Physician(s) Rosaline Skains, NP    [facility]    Staff Present Ronal Levin, RN, BSN;Randi Reeve BS, ACSM-CEP, Exercise Physiologist;Casey Claudene, RT    Virtual Visit No    Medication changes reported     No    Fall or balance concerns reported    Yes    Comments uses wheelchair, cane, and walking stick    Tobacco Cessation No Change    Warm-up and Cool-down Performed as group-led instruction   only   Resistance Training Performed Yes    VAD Patient? No    PAD/SET Patient? No      Pain Assessment   Currently in Pain? No/denies    Multiple Pain Sites No             Capillary Blood Glucose: No results found for this or any previous visit (from the past 24 hours).    Social History   Tobacco Use  Smoking Status Never   Passive exposure: Never  Smokeless Tobacco Never    Goals Met:  Exercise tolerated well No report of concerns or symptoms today Strength training completed today  Goals Unmet:  Not Applicable  Comments: Service time is from 1007 to 1150    Dr. Slater Staff is Medical Director for Pulmonary Rehab at [facility].

## 2023-02-23 ENCOUNTER — Encounter (HOSPITAL_COMMUNITY)
Admission: RE | Admit: 2023-02-23 | Discharge: 2023-02-23 | Disposition: A | Discharge status: Home / Self Care | Payer: Medicare HMO | Source: Ambulatory Visit | Attending: Pulmonary Disease | Admitting: Pulmonary Disease

## 2023-02-23 VITALS — Wt 307.3 lb

## 2023-02-23 DIAGNOSIS — I5042 Chronic combined systolic (congestive) and diastolic (congestive) heart failure: Secondary | ICD-10-CM

## 2023-02-23 NOTE — Progress Notes (Signed)
 Daily Session Note  Patient Details  Name: Thomas [name] MRN: [id number] Date of Birth: 07/12/1950 Referring Provider:   Conrad Ports Pulmonary Rehab Walk Test from 02/05/2023 in [facility] for Heart, Vascular, & Lung Health  Referring Provider Sood       Encounter Date: 02/23/2023  Check In:  Session Check In - 02/23/23 1015       Check-In   Supervising physician immediately available to respond to emergencies [facility] MD immediately available    Physician(s) Rosaline Skains, NP    [facility]    Staff Present Ronal Levin, RN, BSN;Randi Midge BS, ACSM-CEP, Exercise Physiologist;Casey Claudene Neita Moats, MS, ACSM-CEP, Exercise Physiologist    Virtual Visit No    Medication changes reported     No    Fall or balance concerns reported    Yes    Comments uses wheelchair, cane, and walking stick    Tobacco Cessation No Change    Warm-up and Cool-down Performed as group-led instruction   only   Resistance Training Performed Yes    VAD Patient? No    PAD/SET Patient? No      Pain Assessment   Currently in Pain? No/denies    Multiple Pain Sites No             Capillary Blood Glucose: No results found for this or any previous visit (from the past 24 hours).   Exercise Prescription Changes - 02/23/23 1100       Response to Exercise   Blood Pressure (Admit) 112/70    Blood Pressure (Exercise) 104/50    Blood Pressure (Exit) 98/56    Heart Rate (Admit) 61 bpm    Heart Rate (Exercise) 85 bpm    Heart Rate (Exit) 70 bpm    Oxygen  Saturation (Admit) 93 %   room air   Oxygen  Saturation (Exercise) 89 %   2L   Oxygen  Saturation (Exit) 97 %   2L   Rating of Perceived Exertion (Exercise) 11    Perceived Dyspnea (Exercise) 1    Duration Progress to 30 minutes of  aerobic without signs/symptoms of physical distress    Intensity THRR unchanged      Progression   Progression Continue to progress workloads to  maintain intensity without signs/symptoms of physical distress.      Resistance Training   Training Prescription Yes    Weight blue bands    Reps 10-15    Time 10 Minutes      Interval Training   Interval Training No      Oxygen    Oxygen  Continuous    Liters --   2-3L     NuStep   Level 3    Minutes 30    METs 1.9      Oxygen    Maintain Oxygen  Saturation 88% or higher             Social History   Tobacco Use  Smoking Status Never   Passive exposure: Never  Smokeless Tobacco Never    Goals Met:  Exercise tolerated well No report of concerns or symptoms today Strength training completed today  Goals Unmet:  Not Applicable  Comments: Service time is from 1004 to 1131    Dr. Slater Staff is Medical Director for Pulmonary Rehab at [facility].

## 2023-02-25 ENCOUNTER — Encounter (HOSPITAL_COMMUNITY)
Admission: RE | Admit: 2023-02-25 | Discharge: 2023-02-25 | Disposition: A | Discharge status: Home / Self Care | Payer: Medicare HMO | Source: Ambulatory Visit | Attending: Pulmonary Disease | Admitting: Pulmonary Disease

## 2023-02-25 ENCOUNTER — Other Ambulatory Visit (HOSPITAL_BASED_OUTPATIENT_CLINIC_OR_DEPARTMENT_OTHER): Payer: Self-pay

## 2023-02-25 ENCOUNTER — Other Ambulatory Visit (HOSPITAL_COMMUNITY): Payer: Self-pay

## 2023-02-25 DIAGNOSIS — I5042 Chronic combined systolic (congestive) and diastolic (congestive) heart failure: Secondary | ICD-10-CM | POA: Diagnosis not present

## 2023-02-25 NOTE — Progress Notes (Signed)
 Daily Session Note  Patient Details  Name: Thomas [name] MRN: [id number] Date of Birth: 03-16-1950 Referring Provider:   Conrad [name] Pulmonary Rehab Walk Test from 02/05/2023 in [facility] for Heart, Vascular, & Lung Health  Referring Provider Sood       Encounter Date: 02/25/2023  Check In:  Session Check In - 02/25/23 1019       Check-In   Supervising physician immediately available to respond to emergencies [facility] MD immediately available    Physician(s) Thomas Press, NP    Location [facility]    Staff Present Thomas Levin, RN, BSN;Thomas [name] BS, ACSM-CEP, Exercise Physiologist;Thomas Claudene Neita Moats, MS, ACSM-CEP, Exercise Physiologist    Virtual Visit No    Medication changes reported     No    Fall or balance concerns reported    Yes    Comments uses wheelchair, cane, and walking stick    Tobacco Cessation No Change    Warm-up and Cool-down Performed as group-led instruction   only   Resistance Training Performed Yes    VAD Patient? No    PAD/SET Patient? No      Pain Assessment   Currently in Pain? No/denies    Multiple Pain Sites No             Capillary Blood Glucose: No results found for this or any previous visit (from the past 24 hours).    Social History   Tobacco Use  Smoking Status Never   Passive exposure: Never  Smokeless Tobacco Never    Goals Met:  Proper associated with RPD/PD & O2 Sat Exercise tolerated well No report of concerns or symptoms today Strength training completed today  Goals Unmet:  Not Applicable  Comments: Service time is from 1004 to 1134.    Thomas [name] Staff is Medical Director for Pulmonary Rehab at [facility].

## 2023-02-26 ENCOUNTER — Other Ambulatory Visit (HOSPITAL_COMMUNITY): Payer: Self-pay

## 2023-02-26 ENCOUNTER — Telehealth: Payer: Self-pay | Admitting: Pharmacist Clinician (PhC)/ Clinical Pharmacy Specialist

## 2023-02-26 ENCOUNTER — Other Ambulatory Visit: Payer: Self-pay | Admitting: Nurse Practitioner

## 2023-02-26 ENCOUNTER — Ambulatory Visit: Payer: Self-pay | Admitting: Licensed Clinical Social Worker

## 2023-02-26 ENCOUNTER — Telehealth: Payer: Self-pay | Admitting: Pharmacy Technician

## 2023-02-26 DIAGNOSIS — I10 Essential (primary) hypertension: Secondary | ICD-10-CM

## 2023-02-26 MED ORDER — MOUNJARO 7.5 MG/0.5ML ~~LOC~~ SOAJ
7.5000 mg | SUBCUTANEOUS | 1 refills | Status: DC
  Filled 2023-02-26: qty 2, 28d supply, fill #0
  Filled 2023-03-18 – 2023-03-19 (×2): qty 2, 28d supply, fill #1

## 2023-02-26 NOTE — Telephone Encounter (Signed)
 Pharmacy Patient Advocate Encounter   Received notification from Pt Calls Messages that prior authorization for mounjaro  is required/requested.   Insurance verification completed.   The patient is insured through [employer] .   Per test claim: The current 02/26/23 day co-pay is, $297.00 one month (deductible).  No PA needed at this time. This test claim was processed through [facility]- copay amounts may vary at other pharmacies due to pharmacy/plan contracts, or as the patient moves through the different stages of their insurance plan.    PA on file extended 02/17/23- 02/16/24

## 2023-02-26 NOTE — Patient Instructions (Signed)
 Visit Information  Thank you for taking time to visit with me today. Please don't hesitate to contact me if I can be of assistance to you.   Following are the goals we discussed today:   Goals Addressed             This Visit's Progress    COMPLETED: Care Coordination Activities       Care Coordination Interventions: Patient stated that he needs information and contact for Legal Aide, SW will mail recourses, Patient is concerned about utilizing a medical device like a CPAP that has caused him to him breathing issues.  Patient stated that he and his wife were having some food insecurities but now things are better but could still use some resources, SW will mail some resources for food pantry's Patient stated that his utility bills are high but they do pay them and they are not behind or in jeopardy of being disconnected, but would like some resources, SW will mail some utility resources. Sw will follow up on 02/09/2023 at 11:15 am        No further follow up needed, Sw will be closing case out. SW encouraged patient to contact PCP if SW is needed in the future.   Please call the care guide team at [phone] if you need to cancel or reschedule your appointment.   If you are experiencing a Mental Health or Behavioral Health Crisis or need someone to talk to, please call the Suicide and Crisis Lifeline: 988 go to [employer] Urgent [facility] [address] [place] [phone]) call 911  Patient verbalizes understanding of instructions and care plan provided today and agrees to view in MyChart. Active MyChart status and patient understanding of how to access instructions and care plan via MyChart confirmed with patient.     Tobias CHARM Maranda HEDWIG, PhD [facility], [facility] Social Worker Direct Dial: [phone]  Fax: [phone]

## 2023-02-26 NOTE — Patient Outreach (Signed)
  Care Coordination   Follow Up Visit Note   02/26/2023 Name: Thomas [name] MRN: [id number] DOB: [birth year]  Thomas [name] is a 73 y.o. year old male who sees [name], Harlene POUR, NP for primary care. I spoke with  Thomas [name] by phone today.  What matters to the patients health and wellness today?  Mailed resources     Goals Addressed             This Visit's Progress    COMPLETED: Care Coordination Activities       Care Coordination Interventions: Patient stated that he needs information and contact for Legal Aide, SW will mail recourses, Patient is concerned about utilizing a medical device like a CPAP that has caused him to him breathing issues.  Patient stated that he and his wife were having some food insecurities but now things are better but could still use some resources, SW will mail some resources for food pantry's Patient stated that his utility bills are high but they do pay them and they are not behind or in jeopardy of being disconnected, but would like some resources, SW will mail some utility resources. Sw will follow up on 02/09/2023 at 11:15 am         SDOH assessments and interventions completed:  Yes  SDOH Interventions Today    Flowsheet Row Most Recent Value  SDOH Interventions   Food Insecurity Interventions Intervention Not Indicated  Housing Interventions Intervention Not Indicated  Transportation Interventions Intervention Not Indicated  Utilities Interventions Intervention Not Indicated        Care Coordination Interventions:  Yes, provided  Interventions Today    Flowsheet Row Most Recent Value  General Interventions   General Interventions Discussed/Reviewed General Interventions Reviewed, [employer] has received all resurces mailed and stated that he is good now, SW reminded patient about an upcoming appointment with the nurse on 03/03/2023]        Follow up plan: No further intervention  required.   Encounter Outcome:  Patient Visit Completed   Thomas CHARM Maranda HEDWIG, PhD [facility], [facility] Social Worker Direct Dial: [phone]  Fax: [phone]

## 2023-02-26 NOTE — Telephone Encounter (Signed)
 Insurance requesting new PA for Mounjaro 15 mg.  Pt has been taking for several months.
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
## 2023-02-26 NOTE — Telephone Encounter (Signed)
 Pt spouse called in asking to speak with phamD about this med.

## 2023-03-02 ENCOUNTER — Other Ambulatory Visit (HOSPITAL_COMMUNITY): Payer: Self-pay

## 2023-03-02 ENCOUNTER — Encounter (HOSPITAL_COMMUNITY)
Admission: RE | Admit: 2023-03-02 | Discharge: 2023-03-02 | Disposition: A | Discharge status: Home / Self Care | Payer: Medicare HMO | Source: Ambulatory Visit | Attending: Pulmonary Disease | Admitting: Pulmonary Disease

## 2023-03-02 ENCOUNTER — Other Ambulatory Visit: Payer: Self-pay | Admitting: Nurse Practitioner

## 2023-03-02 DIAGNOSIS — I5042 Chronic combined systolic (congestive) and diastolic (congestive) heart failure: Secondary | ICD-10-CM

## 2023-03-02 DIAGNOSIS — M170 Bilateral primary osteoarthritis of knee: Secondary | ICD-10-CM

## 2023-03-02 MED ORDER — TRAMADOL HCL 50 MG PO TABS
50.0000 mg | ORAL_TABLET | Freq: Four times a day (QID) | ORAL | 0 refills | Status: DC | PRN
  Filled 2023-03-02: qty 30, 8d supply, fill #0

## 2023-03-02 NOTE — Progress Notes (Signed)
 Daily Session Note  Patient Details  Name: Thomas [name] MRN: [id number] Date of Birth: [birth year] Referring Provider:   Conrad Ports Pulmonary Rehab Walk Test from 02/05/2023 in [facility] for Heart, Vascular, & Lung Health  Referring Provider Sood       Encounter Date: 03/02/2023  Check In:  Session Check In - 03/02/23 1025       Check-In   Supervising physician immediately available to respond to emergencies [facility] MD immediately available    Physician(s) Lamarr Satterfield, NP    Location [facility]    Staff Present Ronal Levin, RN, BSN;Randi Midge BS, ACSM-CEP, Exercise Physiologist;Sloane Palmer Claudene Neita Moats, MS, ACSM-CEP, Exercise Physiologist    Virtual Visit No    Medication changes reported     No    Fall or balance concerns reported    Yes    Comments uses wheelchair, cane, and walking stick    Tobacco Cessation No Change    Warm-up and Cool-down Performed as group-led instruction   only   Resistance Training Performed Yes    VAD Patient? No    PAD/SET Patient? No      Pain Assessment   Currently in Pain? No/denies    Multiple Pain Sites No             Capillary Blood Glucose: No results found for this or any previous visit (from the past 24 hours).    Social History   Tobacco Use  Smoking Status Never   Passive exposure: Never  Smokeless Tobacco Never    Goals Met:  Proper associated with RPD/PD & O2 Sat Independence with exercise equipment Exercise tolerated well No report of concerns or symptoms today Strength training completed today  Goals Unmet:  Not Applicable  Comments: Service time is from 1012 to 1130.    Dr. Slater Staff is Medical Director for Pulmonary Rehab at [facility].

## 2023-03-02 NOTE — Telephone Encounter (Signed)
 Patient is requesting a refill of the following medications: Requested Prescriptions   Pending Prescriptions Disp Refills   traMADol  (ULTRAM ) 50 MG tablet 30 tablet 0    Sig: Take 1 tablet (50 mg total) by mouth every 6 (six) hours as needed for pain    Date of last refill:01/20/2023  Refill amount: 30 tablets 0 refills   Treatment agreement date: 06/29/2022

## 2023-03-03 ENCOUNTER — Ambulatory Visit: Payer: Self-pay

## 2023-03-03 NOTE — Patient Outreach (Signed)
  Care Coordination   Follow Up Visit Note   03/03/2023 Name: Thomas [name] MRN: [id number] DOB: 10-26-1950  STEAVE LATRONICA is a 73 y.o. year old male who sees [name], Champ Coma, NP for primary care. I spoke with  Arlie Benedict by phone today.  What matters to the patients health and wellness today?  Patient would like to breath better.     Goals Addressed             This Visit's Progress    To improve shortness of breath   On track    Care Coordination Interventions: Evaluation of current treatment plan related to dyspnea on exertion  and patient's adherence to plan as established by provider Discussed with patient he is currently participating in Pulmonary Rehab twice weekly, patient states he feels better overall since starting the program Reviewed and discussed with patient his upcoming subsequent Pulmonary Rehab visits and next scheduled Cardiovascular follow up with Dr. Paulita Boss scheduled for 05/10/23 @8 :20 AM Instructed patient to keep his doctor informed of new or worsening symptoms Confirmed patient completed a call with Ami Kail [name] for the resources previously requested Discussed plans with patient for ongoing care coordination follow up and provided patient with direct contact information for nurse care coordinator    Interventions Today    Flowsheet Row Most Recent Value  Chronic Disease   Chronic disease during today's visit Congestive Heart Failure (CHF)  General Interventions   General Interventions Discussed/Reviewed General Interventions Reviewed, General Interventions Discussed, Doctor Visits  Doctor Visits Discussed/Reviewed Doctor Visits Reviewed, Doctor Visits Discussed, Specialist, PCP  Education Interventions   Education Provided Provided Education  Provided Verbal Education On When to see the doctor          SDOH assessments and interventions completed:  No     Care Coordination Interventions:  Yes, provided    Follow up plan: Follow up call scheduled for 05/12/23 @2 :00 PM    Encounter Outcome:  Patient Visit Completed

## 2023-03-03 NOTE — Patient Instructions (Signed)
 Visit Information  Thank you for taking time to visit with me today. Please don't hesitate to contact me if I can be of assistance to you.   Following are the goals we discussed today:   Goals Addressed             This Visit's Progress    To improve shortness of breath   On track    Care Coordination Interventions: Evaluation of current treatment plan related to dyspnea on exertion  and patient's adherence to plan as established by provider Discussed with patient he is currently participating in Pulmonary Rehab twice weekly, patient states he feels better overall since starting the program Reviewed and discussed with patient his upcoming subsequent Pulmonary Rehab visits and next scheduled Cardiovascular follow up with Dr. Paulita Boss scheduled for 05/10/23 @8 :20 AM Instructed patient to keep his doctor informed of new or worsening symptoms Confirmed patient completed a call with Ami Kail [name] for the resources previously requested Discussed plans with patient for ongoing care coordination follow up and provided patient with direct contact information for nurse care coordinator        Our next appointment is by telephone on 05/12/23 at 2:00 PM  Please call the care guide team at [phone] if you need to cancel or reschedule your appointment.   If you are experiencing a Mental Health or Behavioral Health Crisis or need someone to talk to, please call 1-800-273-TALK (toll free, 24 hour hotline)  Patient verbalizes understanding of instructions and care plan provided today and agrees to view in MyChart. Active MyChart status and patient understanding of how to access instructions and care plan via MyChart confirmed with patient.     Louanne Roussel RN BSN CCM [place]  [facility], [facility] Nurse Care Coordinator  Direct Dial: [phone] Website: [email or web address]

## 2023-03-04 ENCOUNTER — Encounter (HOSPITAL_COMMUNITY)
Admission: RE | Admit: 2023-03-04 | Discharge: 2023-03-04 | Disposition: A | Discharge status: Home / Self Care | Payer: Medicare HMO | Source: Ambulatory Visit | Attending: Pulmonary Disease | Admitting: Pulmonary Disease

## 2023-03-04 ENCOUNTER — Other Ambulatory Visit (HOSPITAL_COMMUNITY): Payer: Self-pay

## 2023-03-04 DIAGNOSIS — I5042 Chronic combined systolic (congestive) and diastolic (congestive) heart failure: Secondary | ICD-10-CM

## 2023-03-04 NOTE — Progress Notes (Signed)
Daily Session Note  Patient Details  Name: Thomas [name] MRN: [id number] Date of Birth: September 26, 1950 Referring Provider:   Doristine Devoid Pulmonary Rehab Walk Test from 02/05/2023 in [facility]  Referring Provider Sood       Encounter Date: 03/04/2023  Check In:  Session Check In - 03/04/23 1024       Check-In   Supervising physician immediately available to respond to emergencies [facility] MD immediately available    Physician(s) Joni Reining, NP    Location [facility]    Staff Present Essie Hart, RN, BSN;Randi Idelle Crouch BS, ACSM-CEP, Exercise Physiologist;Khayman Kirsch Charlean Sanfilippo, MS, ACSM-CEP, Exercise Physiologist    Virtual Visit No    Medication changes reported     No    Fall or balance concerns reported    Yes    Comments uses wheelchair, cane, and walking stick    Tobacco Cessation No Change    Warm-up and Cool-down Performed as group-led instruction   only   Resistance Training Performed Yes    VAD Patient? No    PAD/SET Patient? No      Pain Assessment   Currently in Pain? No/denies    Multiple Pain Sites No             Capillary Blood Glucose: No results found for this or any previous visit (from the past 24 hours).    Social History   Tobacco Use  Smoking Status Never   Passive exposure: Never  Smokeless Tobacco Never    Goals Met:  Proper associated with RPD/PD & O2 Sat Independence with exercise equipment Exercise tolerated well No report of concerns or symptoms today Strength training completed today  Goals Unmet:  Not Applicable  Comments: Service time is from 1007 to 1138.    Dr. Mechele Collin is Medical Director for Pulmonary Rehab at [facility].

## 2023-03-09 ENCOUNTER — Encounter (HOSPITAL_COMMUNITY)
Admission: RE | Admit: 2023-03-09 | Discharge: 2023-03-09 | Disposition: A | Discharge status: Home / Self Care | Payer: Medicare HMO | Source: Ambulatory Visit | Attending: Pulmonary Disease | Admitting: Pulmonary Disease

## 2023-03-09 VITALS — Wt 306.9 lb

## 2023-03-09 DIAGNOSIS — I5042 Chronic combined systolic (congestive) and diastolic (congestive) heart failure: Secondary | ICD-10-CM | POA: Diagnosis not present

## 2023-03-09 NOTE — Progress Notes (Signed)
Daily Session Note  Patient Details  Name: Thomas [name] MRN: [id number] Date of Birth: May 02, 1950 Referring Provider:   Doristine Devoid Pulmonary Rehab Walk Test from 02/05/2023 in [facility]  Referring Provider Sood       Encounter Date: 03/09/2023  Check In:  Session Check In - 03/09/23 1128       Check-In   Supervising physician immediately available to respond to emergencies [facility] MD immediately available    Physician(s) Bernadene Person, NP    Location [facility]    Staff Present Essie Hart, RN, BSN;Casey Katrinka Blazing, Zella Richer, MS, ACSM-CEP, Exercise Physiologist;Johnny Hale Bogus, MS, Exercise Physiologist    Virtual Visit No    Medication changes reported     No    Fall or balance concerns reported    Yes    Comments uses wheelchair, cane, and walking stick    Tobacco Cessation No Change    Warm-up and Cool-down Performed as group-led instruction   only   Resistance Training Performed Yes    VAD Patient? No    PAD/SET Patient? No      Pain Assessment   Currently in Pain? No/denies    Multiple Pain Sites No             Capillary Blood Glucose: No results found for this or any previous visit (from the past 24 hours).   Exercise Prescription Changes - 03/09/23 1200       Response to Exercise   Blood Pressure (Admit) 106/58    Blood Pressure (Exercise) 122/69    Blood Pressure (Exit) 128/52    Heart Rate (Admit) 73 bpm    Heart Rate (Exercise) 114 bpm    Heart Rate (Exit) 82 bpm    Oxygen Saturation (Admit) 99 %   3L   Oxygen Saturation (Exercise) 91 %   4L   Oxygen Saturation (Exit) 98 %   3L   Rating of Perceived Exertion (Exercise) 13    Perceived Dyspnea (Exercise) 2    Duration Continue with 30 min of aerobic exercise without signs/symptoms of physical distress.    Intensity THRR unchanged      Progression   Progression Continue to progress workloads to maintain  intensity without signs/symptoms of physical distress.      Resistance Training   Training Prescription Yes    Weight blue bands    Reps 10-15    Time 10 Minutes      Interval Training   Interval Training No      Oxygen   Oxygen Continuous    Liters --   3-4L     NuStep   Level 6    Minutes 20    METs 2.2      Track   Laps 3    Minutes 10    METs 1.31      Oxygen   Maintain Oxygen Saturation 88% or higher             Social History   Tobacco Use  Smoking Status Never   Passive exposure: Never  Smokeless Tobacco Never    Goals Met:  Exercise tolerated well No report of concerns or symptoms today Strength training completed today  Goals Unmet:  Not Applicable  Comments: Service time is from 1009 to 1136    Dr. Mechele Collin is Medical Director for Pulmonary Rehab at [facility]

## 2023-03-10 NOTE — Progress Notes (Signed)
Pulmonary Individual Treatment Plan  Patient Details  Name: Thomas [name] MRN: [id number] Date of Birth: [birth year] Referring Provider:   Doristine Devoid Pulmonary Rehab Walk Test from 02/05/2023 in [facility]  Referring Provider Sood       Initial Encounter Date:  Flowsheet Row Pulmonary Rehab Walk Test from 02/05/2023 in [facility]  Date 02/05/23       Visit Diagnosis: Chronic combined systolic (congestive) and diastolic (congestive) heart failure (HCC)  Patient's Home Medications on Admission:   Current Outpatient Medications:    Accu-Chek Softclix Lancets lancets, Use to test blood sugar daily, Disp: 200 each, Rfl: 3   acetaminophen (TYLENOL) 500 MG tablet, Take by mouth., Disp: , Rfl:    Alcohol Swabs (B-D SINGLE USE SWABS REGULAR) PADS, Use in testing blood sugar daily. Dx: E11.22, Disp: 100 each, Rfl: 3   amLODipine (NORVASC) 10 MG tablet, TAKE 1 TABLET EVERY DAY FOR HIGH BLOOD PRESSURE, Disp: 90 tablet, Rfl: 3   aspirin EC 81 MG tablet, Take 81 mg by mouth daily., Disp: , Rfl:    atorvastatin (LIPITOR) 40 MG tablet, TAKE 1 TABLET EVERY DAY, Disp: 90 tablet, Rfl: 3   Blood Glucose Calibration (ACCU-CHEK AVIVA) SOLN, Use once daily as directed dx E11.22, Disp: 1 each, Rfl: 2   Blood Glucose Monitoring Suppl (TRUE METRIX AIR GLUCOSE METER) w/Device KIT, 1 Device by Does not apply route daily as needed. E11.22, Disp: 1 kit, Rfl: 0   carvedilol (COREG) 12.5 MG tablet, TAKE 1 TABLET TWICE DAILY WITH MEALS, Disp: 180 tablet, Rfl: 3   Cholecalciferol (VITAMIN D3) 50 MCG (2000 UT) TABS, Take 1 tablet (2,000 Units) by mouth daily., Disp: 10 tablet, Rfl: 0   empagliflozin (JARDIANCE) 10 MG TABS tablet, Take 1 tablet (10 mg total) by mouth daily., Disp: 30 tablet, Rfl: 0   furosemide (LASIX) 20 MG tablet, TAKE 1 TABLET AS DIRECTED AS NEEDED, Disp: 90 tablet, Rfl:  3   glucose blood (ACCU-CHEK AVIVA PLUS) test strip, Use to test blood sugar daily., Disp: 100 each, Rfl: 3   hydrALAZINE (APRESOLINE) 25 MG tablet, TAKE 3 TABLETS THREE TIMES DAILY, Disp: 810 tablet, Rfl: 3   Lancets Misc. (ACCU-CHEK SOFTCLIX LANCET DEV) KIT, Use to test blood sugar daily, Disp: 1 kit, Rfl: 0   losartan (COZAAR) 50 MG tablet, TAKE 1 TABLET EVERY DAY, Disp: 90 tablet, Rfl: 3   metFORMIN (GLUCOPHAGE) 500 MG tablet, TAKE 1 TABLET TWICE DAILY WITH MEALS, Disp: 180 tablet, Rfl: 3   potassium chloride SA (KLOR-CON M) 20 MEQ tablet, TAKE 1 TABLET TWICE DAILY, Disp: 180 tablet, Rfl: 3   rivaroxaban (XARELTO) 20 MG TABS tablet, Take 1 tablet (20 mg total) by mouth daily with supper., Disp: 30 tablet, Rfl: 12   Senna 8.7 MG CHEW, Chew 1 tablet by mouth as needed., Disp: 90 tablet, Rfl: 1   spironolactone (ALDACTONE) 50 MG tablet, Take 50 mg by mouth daily., Disp: 90 tablet, Rfl: 3   tirzepatide (MOUNJARO) 7.5 MG/0.5ML Pen, Inject 7.5 mg into the skin once a week., Disp: 2 mL, Rfl: 1   traMADol (ULTRAM) 50 MG tablet, Take 1 tablet (50 mg total) by mouth every 6 (six) hours as needed for pain, Disp: 30 tablet, Rfl: 0   TRUEplus Lancets 28G MISC, TEST BLOOD SUGAR EVERY DAY AS NEEDED, Disp: 100 each, Rfl: 3   zinc gluconate 50 MG tablet, Take  1 tablet (50 mg total) by mouth daily., Disp: 14 tablet, Rfl: 0  Past Medical History: Past Medical History:  Diagnosis Date   Acute on chronic diastolic CHF (congestive heart failure) (HCC) 06/01/2017   Arthritis    Cancer (HCC) 2010   Prostate   Chronic kidney disease    Diabetes mellitus    Diabetic neuropathy (HCC)    feet   DVT (deep venous thrombosis) (HCC)    Genetic testing 06/22/2016   Thomas [name] underwent genetic counseling and testing for hereditary cancer syndromes on 05/14/2016. His results were negative for mutations in all 46 genes analyzed by [facility]'s 46-gene Common Hereditary Cancers Panel. Genes analyzed include: APC, ATM,  AXIN2, BARD1, BMPR1A, BRCA1, BRCA2, BRIP1, CDH1, CDKN2A, CHEK2, CTNNA1, DICER1, EPCAM, GREM1, HOXB13, KIT, MEN1, MLH1, MSH2, MSH3, MSH6, MUTYH, NB   GERD (gastroesophageal reflux disease)    Hypertension    PE (pulmonary thromboembolism) (HCC)    Pneumonia    Sleep apnea    not wearing CPAP   Suprapubic catheter (HCC) 11/17/2019    Tobacco Use: Social History   Tobacco Use  Smoking Status Never   Passive exposure: Never  Smokeless Tobacco Never    Labs: Review Flowsheet  More data exists      Latest Ref Rng & Units 05/19/2021 09/29/2021 03/30/2022 07/03/2022 11/03/2022  Labs for ITP Cardiac and Pulmonary Rehab  Cholestrol 100 - 199 mg/dL 952  - 841  - 324   LDL (calc) 0 - 99 mg/dL 47  - 62  - 48   HDL-C >39 mg/dL 52  - 58  - 46   Trlycerides 0 - 149 mg/dL 65  - 84  - 75   Hemoglobin A1c 4.8 - 5.6 % 6.1  7.2  8.6  6.5  5.6     Capillary Blood Glucose: Lab Results  Component Value Date   GLUCAP 206 (H) 02/16/2023   GLUCAP 211 (H) 02/16/2023   GLUCAP 214 (H) 02/11/2023   GLUCAP 203 (H) 02/11/2023   GLUCAP 180 (H) 10/20/2018     Pulmonary Assessment Scores:  Pulmonary Assessment Scores     Row Name 02/05/23 1447         ADL UCSD   ADL Phase Entry     SOB Score total 66       CAT Score   CAT Score 22       mMRC Score   mMRC Score 4             UCSD: Self-administered rating of dyspnea associated with activities of daily living (ADLs) 6-point scale (0 = "not at all" to 5 = "maximal or unable to do because of breathlessness")  Scoring Scores range from 0 to 120.  Minimally important difference is 5 units  CAT: CAT can identify the health impairment of COPD patients and is better correlated with disease progression.  CAT has a scoring range of zero to 40. The CAT score is classified into four groups of low (less than 10), medium (10 - 20), high (21-30) and very high (31-40) based on the impact level of disease on health status. A CAT score over 10 suggests  significant symptoms.  A worsening CAT score could be explained by an exacerbation, poor medication adherence, poor inhaler technique, or progression of COPD or comorbid conditions.  CAT MCID is 2 points  mMRC: mMRC (Modified Medical Research Council) Dyspnea Scale is used to assess the degree of baseline functional disability in patients of respiratory disease due  to dyspnea. No minimal important difference is established. A decrease in score of 1 point or greater is considered a positive change.   Pulmonary Function Assessment:  Pulmonary Function Assessment - 02/05/23 1338       Breath   Bilateral Breath Sounds Clear    Shortness of Breath Yes;Limiting activity             Exercise Target Goals: Exercise Program Goal: Individual exercise prescription set using results from initial 6 min walk test and THRR while considering  patient's activity barriers and safety.   Exercise Prescription Goal: Initial exercise prescription builds to 30-45 minutes a day of aerobic activity, 2-3 days per week.  Home exercise guidelines will be given to patient during program as part of exercise prescription that the participant will acknowledge.  Activity Barriers & Risk Stratification:  Activity Barriers & Cardiac Risk Stratification - 02/05/23 1336       Activity Barriers & Cardiac Risk Stratification   Activity Barriers Right Knee Replacement;Left Knee Replacement;Muscular Weakness;Deconditioning;Joint Problems;Balance Concerns;Shortness of Breath;Assistive Device             6 Minute Walk:  6 Minute Walk     Row Name 02/05/23 1438         6 Minute Walk   Phase Initial  Nustep test     Distance 1584 feet     Walk Time 6 minutes     # of Rest Breaks 0     MPH 3     METS 2.61     RPE 11     Perceived Dyspnea  1     VO2 Peak 9.13     Symptoms No     Resting HR 61 bpm     Resting BP 122/62     Resting Oxygen Saturation  94 %     Exercise Oxygen Saturation  during 6 min  walk 87 %     Max Ex. HR 123 bpm     Max Ex. BP 122/64     2 Minute Post BP 120/60       Interval HR   1 Minute HR 84     2 Minute HR 89     3 Minute HR 97     4 Minute HR 99     5 Minute HR 89     6 Minute HR 123     2 Minute Post HR 64     Interval Heart Rate? Yes       Interval Oxygen   Interval Oxygen? Yes     Baseline Oxygen Saturation % 94 %     1 Minute Oxygen Saturation % 90 %     1 Minute Liters of Oxygen 0 L     2 Minute Oxygen Saturation % 89 %     2 Minute Liters of Oxygen 0 L     3 Minute Oxygen Saturation % 90 %     3 Minute Liters of Oxygen 0 L     4 Minute Oxygen Saturation % 89 %     4 Minute Liters of Oxygen 0 L     5 Minute Oxygen Saturation % 88 %     5 Minute Liters of Oxygen 0 L     6 Minute Oxygen Saturation % 88 %  87% @ 5:39     6 Minute Liters of Oxygen 2 L     2 Minute Post Oxygen Saturation % 96 %  2 Minute Post Liters of Oxygen 2 L              Oxygen Initial Assessment:  Oxygen Initial Assessment - 02/05/23 1337       Home Oxygen   Home Oxygen Device E-Tanks;Home Concentrator    Sleep Oxygen Prescription CPAP    Liters per minute 0    Home Exercise Oxygen Prescription Continuous    Liters per minute 2    Home Resting Oxygen Prescription None    Compliance with Home Oxygen Use Yes      Initial 6 min Walk   Oxygen Used Continuous    Liters per minute 2      Program Oxygen Prescription   Program Oxygen Prescription Continuous    Liters per minute 2      Intervention   Short Term Goals To learn and exhibit compliance with exercise, home and travel O2 prescription;To learn and understand importance of monitoring SPO2 with pulse oximeter and demonstrate accurate use of the pulse oximeter.;To learn and understand importance of maintaining oxygen saturations>88%;To learn and demonstrate proper pursed lip breathing techniques or other breathing techniques.     Long  Term Goals Exhibits compliance with exercise, home  and travel O2  prescription;Verbalizes importance of monitoring SPO2 with pulse oximeter and return demonstration;Maintenance of O2 saturations>88%;Exhibits proper breathing techniques, such as pursed lip breathing or other method taught during program session             Oxygen Re-Evaluation:  Oxygen Re-Evaluation     Row Name 02/05/23 1337 03/05/23 1441           Program Oxygen Prescription   Program Oxygen Prescription -- Continuous      Liters per minute -- 2        Home Oxygen   Home Oxygen Device -- E-Tanks;Home Concentrator      Sleep Oxygen Prescription -- CPAP      Liters per minute -- 0      Home Exercise Oxygen Prescription -- Continuous      Liters per minute -- 2      Home Resting Oxygen Prescription -- None      Compliance with Home Oxygen Use -- Yes        Goals/Expected Outcomes   Short Term Goals -- To learn and exhibit compliance with exercise, home and travel O2 prescription;To learn and understand importance of monitoring SPO2 with pulse oximeter and demonstrate accurate use of the pulse oximeter.;To learn and understand importance of maintaining oxygen saturations>88%;To learn and demonstrate proper pursed lip breathing techniques or other breathing techniques.       Long  Term Goals -- Exhibits compliance with exercise, home  and travel O2 prescription;Verbalizes importance of monitoring SPO2 with pulse oximeter and return demonstration;Maintenance of O2 saturations>88%;Exhibits proper breathing techniques, such as pursed lip breathing or other method taught during program session      Goals/Expected Outcomes Compliance and understanding of oxygen saturation monitoring and breathing techniques to decrease shortness of breath. Compliance and understanding of oxygen saturation monitoring and breathing techniques to decrease shortness of breath.               Oxygen Discharge (Final Oxygen Re-Evaluation):  Oxygen Re-Evaluation - 03/05/23 1441       Program Oxygen  Prescription   Program Oxygen Prescription Continuous    Liters per minute 2      Home Oxygen   Home Oxygen Device E-Tanks;Home Concentrator    Sleep Oxygen Prescription CPAP  Liters per minute 0    Home Exercise Oxygen Prescription Continuous    Liters per minute 2    Home Resting Oxygen Prescription None    Compliance with Home Oxygen Use Yes      Goals/Expected Outcomes   Short Term Goals To learn and exhibit compliance with exercise, home and travel O2 prescription;To learn and understand importance of monitoring SPO2 with pulse oximeter and demonstrate accurate use of the pulse oximeter.;To learn and understand importance of maintaining oxygen saturations>88%;To learn and demonstrate proper pursed lip breathing techniques or other breathing techniques.     Long  Term Goals Exhibits compliance with exercise, home  and travel O2 prescription;Verbalizes importance of monitoring SPO2 with pulse oximeter and return demonstration;Maintenance of O2 saturations>88%;Exhibits proper breathing techniques, such as pursed lip breathing or other method taught during program session    Goals/Expected Outcomes Compliance and understanding of oxygen saturation monitoring and breathing techniques to decrease shortness of breath.             Initial Exercise Prescription:  Initial Exercise Prescription - 02/05/23 1400       Date of Initial Exercise RX and Referring Provider   Date 02/05/23    Referring Provider Sood    Expected Discharge Date 05/04/23      NuStep   Level 1    SPM 92    Minutes 30    METs 1.9      Prescription Details   Frequency (times per week) 2    Duration Progress to 30 minutes of continuous aerobic without signs/symptoms of physical distress      Intensity   THRR 40-80% of Max Heartrate 59-118    Ratings of Perceived Exertion 11-13    Perceived Dyspnea 0-4      Progression   Progression Continue to progress workloads to maintain intensity without  signs/symptoms of physical distress.      Resistance Training   Training Prescription Yes    Weight blue bands    Reps 10-15             Perform Capillary Blood Glucose checks as needed.  Exercise Prescription Changes:   Exercise Prescription Changes     Row Name 02/23/23 1100 03/09/23 1200           Response to Exercise   Blood Pressure (Admit) 112/70 106/58      Blood Pressure (Exercise) 104/50 122/69      Blood Pressure (Exit) 98/56 128/52      Heart Rate (Admit) 61 bpm 73 bpm      Heart Rate (Exercise) 85 bpm 114 bpm      Heart Rate (Exit) 70 bpm 82 bpm      Oxygen Saturation (Admit) 93 %  room air 99 %  3L      Oxygen Saturation (Exercise) 89 %  2L 91 %  4L      Oxygen Saturation (Exit) 97 %  2L 98 %  3L      Rating of Perceived Exertion (Exercise) 11 13      Perceived Dyspnea (Exercise) 1 2      Duration Progress to 30 minutes of  aerobic without signs/symptoms of physical distress Continue with 30 min of aerobic exercise without signs/symptoms of physical distress.      Intensity THRR unchanged THRR unchanged        Progression   Progression Continue to progress workloads to maintain intensity without signs/symptoms of physical distress. Continue to progress workloads to maintain intensity  without signs/symptoms of physical distress.        Resistance Training   Training Prescription Yes Yes      Weight blue bands blue bands      Reps 10-15 10-15      Time 10 Minutes 10 Minutes        Interval Training   Interval Training No No        Oxygen   Oxygen Continuous Continuous      Liters --  2-3L --  3-4L        NuStep   Level 3 6      Minutes 30 20      METs 1.9 2.2        Track   Laps -- 3      Minutes -- 10      METs -- 1.31        Oxygen   Maintain Oxygen Saturation 88% or higher 88% or higher               Exercise Comments:   Exercise Comments     Row Name 02/11/23 1214           Exercise Comments Pt completed first day of  group exercise. He exercised on the recumbent stepper for 30 min at level 3, METs 2.2. He tolerated well. He uses his wheelchair for transport and remained seated during warm up and cool down today. Did leg extensions instead of squats. Will progress to standing as tolerated. Discussed METs with good reception.                Exercise Goals and Review:   Exercise Goals     Row Name 02/05/23 1336             Exercise Goals   Increase Physical Activity Yes       Intervention Provide advice, education, support and counseling about physical activity/exercise needs.;Develop an individualized exercise prescription for aerobic and resistive training based on initial evaluation findings, risk stratification, comorbidities and participant's personal goals.       Expected Outcomes Short Term: Attend rehab on a regular basis to increase amount of physical activity.;Long Term: Add in home exercise to make exercise part of routine and to increase amount of physical activity.;Long Term: Exercising regularly at least 3-5 days a week.       Increase Strength and Stamina Yes       Intervention Provide advice, education, support and counseling about physical activity/exercise needs.;Develop an individualized exercise prescription for aerobic and resistive training based on initial evaluation findings, risk stratification, comorbidities and participant's personal goals.       Expected Outcomes Short Term: Increase workloads from initial exercise prescription for resistance, speed, and METs.;Short Term: Perform resistance training exercises routinely during rehab and add in resistance training at home;Long Term: Improve cardiorespiratory fitness, muscular endurance and strength as measured by increased METs and functional capacity ( )       Able to understand and use rate of perceived exertion (RPE) scale Yes       Intervention Provide education and explanation on how to use RPE scale       Expected  Outcomes Short Term: Able to use RPE daily in rehab to express subjective intensity level;Long Term:  Able to use RPE to guide intensity level when exercising independently       Able to understand and use Dyspnea scale Yes       Intervention Provide education and explanation on  how to use Dyspnea scale       Expected Outcomes Short Term: Able to use Dyspnea scale daily in rehab to express subjective sense of shortness of breath during exertion;Long Term: Able to use Dyspnea scale to guide intensity level when exercising independently       Knowledge and understanding of Target Heart Rate Range (THRR) Yes       Intervention Provide education and explanation of THRR including how the numbers were predicted and where they are located for reference       Expected Outcomes Short Term: Able to state/look up THRR;Short Term: Able to use daily as guideline for intensity in rehab;Long Term: Able to use THRR to govern intensity when exercising independently       Understanding of Exercise Prescription Yes       Intervention Provide education, explanation, and written materials on patient's individual exercise prescription       Expected Outcomes Short Term: Able to explain program exercise prescription;Long Term: Able to explain home exercise prescription to exercise independently                Exercise Goals Re-Evaluation :  Exercise Goals Re-Evaluation     Row Name 02/05/23 1446 03/05/23 1429           Exercise Goal Re-Evaluation   Exercise Goals Review Increase Physical Activity;Able to understand and use Dyspnea scale;Understanding of Exercise Prescription;Increase Strength and Stamina;Knowledge and understanding of Target Heart Rate Range (THRR);Able to understand and use rate of perceived exertion (RPE) scale Increase Physical Activity;Able to understand and use Dyspnea scale;Understanding of Exercise Prescription;Increase Strength and Stamina;Knowledge and understanding of Target Heart Rate  Range (THRR);Able to understand and use rate of perceived exertion (RPE) scale      Comments Axil is scheduled to start exercise on 12/26. Will monitor and progress as able. Harrold Donath has completed 7 exercise sessions. He exercises for 20 min on the Nustep and 10 min on the track. Nathan averages 2.2 METs at level 6 on the Nustep and 1.26 METs on the track. Harrold Donath performs the warmup and cooldown standing/ seated dependent on his shortness of breath and pain. He tries to do as much as possible standing. Harrold Donath has progressed to walking the track for 10 min. He tolerates track walking fair and takes rest breaks as needed. Harrold Donath has increased his level on the Nustep as METs have increased. Nathan tolerates the increased level well. Harrold Donath is very motivated to exercise and improve his functional capacity.      Expected Outcomes Through exercise at rehab and home, the patient will decrease shortness of breath with daily activites and feel confident in carrying out an exercise regimen at home Through exercise at rehab and home, the patient will decrease shortness of breath with daily activites and feel confident in carrying out an exercise regimen at home               Discharge Exercise Prescription (Final Exercise Prescription Changes):  Exercise Prescription Changes - 03/09/23 1200       Response to Exercise   Blood Pressure (Admit) 106/58    Blood Pressure (Exercise) 122/69    Blood Pressure (Exit) 128/52    Heart Rate (Admit) 73 bpm    Heart Rate (Exercise) 114 bpm    Heart Rate (Exit) 82 bpm    Oxygen Saturation (Admit) 99 %   3L   Oxygen Saturation (Exercise) 91 %   4L   Oxygen Saturation (Exit) 98 %  3L   Rating of Perceived Exertion (Exercise) 13    Perceived Dyspnea (Exercise) 2    Duration Continue with 30 min of aerobic exercise without signs/symptoms of physical distress.    Intensity THRR unchanged      Progression   Progression Continue to progress workloads to maintain  intensity without signs/symptoms of physical distress.      Resistance Training   Training Prescription Yes    Weight blue bands    Reps 10-15    Time 10 Minutes      Interval Training   Interval Training No      Oxygen   Oxygen Continuous    Liters --   3-4L     NuStep   Level 6    Minutes 20    METs 2.2      Track   Laps 3    Minutes 10    METs 1.31      Oxygen   Maintain Oxygen Saturation 88% or higher             Nutrition:  Target Goals: Understanding of nutrition guidelines, daily intake of sodium 1500mg , cholesterol 200mg , calories 30% from fat and 7% or less from saturated fats, daily to have 5 or more servings of fruits and vegetables.  Biometrics:  Pre Biometrics - 02/05/23 1348       Pre Biometrics   Grip Strength 40 kg              Nutrition Therapy Plan and Nutrition Goals:   Nutrition Assessments:  MEDIFICTS Score Key: >=70 Need to make dietary changes  40-70 Heart Healthy Diet <= 40 Therapeutic Level Cholesterol Diet   Picture Your Plate Scores: <67 Unhealthy dietary pattern with much room for improvement. 41-50 Dietary pattern unlikely to meet recommendations for good health and room for improvement. 51-60 More healthful dietary pattern, with some room for improvement.  >60 Healthy dietary pattern, although there may be some specific behaviors that could be improved.    Nutrition Goals Re-Evaluation:   Nutrition Goals Discharge (Final Nutrition Goals Re-Evaluation):   Psychosocial: Target Goals: Acknowledge presence or absence of significant depression and/or stress, maximize coping skills, provide positive support system. Participant is able to verbalize types and ability to use techniques and skills needed for reducing stress and depression.  Initial Review & Psychosocial Screening:  Initial Psych Review & Screening - 02/05/23 1340       Initial Review   Current issues with None Identified      Family Dynamics    Good Support System? Yes      Barriers   Psychosocial barriers to participate in program There are no identifiable barriers or psychosocial needs.      Screening Interventions   Interventions Encouraged to exercise    Expected Outcomes Long Term Goal: Stressors or current issues are controlled or eliminated.;Short Term goal: Identification and review with participant of any Quality of Life or Depression concerns found by scoring the questionnaire.;Long Term goal: The participant improves quality of Life and PHQ9 Scores as seen by post scores and/or verbalization of changes             Quality of Life Scores:  Scores of 19 and below usually indicate a poorer quality of life in these areas.  A difference of  2-3 points is a clinically meaningful difference.  A difference of 2-3 points in the total score of the Quality of Life Index has been associated with significant improvement in overall  quality of life, self-image, physical symptoms, and general health in studies assessing change in quality of life.  PHQ-9: Review Flowsheet  More data exists      02/05/2023 11/13/2022 11/18/2021 10/30/2021 05/19/2021  Depression screen PHQ 2/9  Decreased Interest 0 0 0 0 0  Down, Depressed, Hopeless 1 0 0 0 0  PHQ - 2 Score 1 0 0 0 0  Altered sleeping 0 - 0 - -  Tired, decreased energy 1 - 0 - -  Change in appetite 0 - 0 - -  Feeling bad or failure about yourself  0 - 0 - -  Trouble concentrating 0 - 0 - -  Moving slowly or fidgety/restless 0 - 0 - -  Suicidal thoughts 0 - 0 - -  PHQ-9 Score 2 - 0 - -  Difficult doing work/chores Somewhat difficult - Not difficult at all - -   Interpretation of Total Score  Total Score Depression Severity:  1-4 = Minimal depression, 5-9 = Mild depression, 10-14 = Moderate depression, 15-19 = Moderately severe depression, 20-27 = Severe depression   Psychosocial Evaluation and Intervention:  Psychosocial Evaluation - 02/05/23 1341       Psychosocial  Evaluation & Interventions   Interventions Encouraged to exercise with the program and follow exercise prescription    Comments Emeal denies any psychosocial barriers or concerns    Expected Outcomes For Wolfgang to participate in PR free of barriers    Continue Psychosocial Services  No Follow up required             Psychosocial Re-Evaluation:  Psychosocial Re-Evaluation     Row Name 02/08/23 0900 03/05/23 1054           Psychosocial Re-Evaluation   Current issues with None Identified None Identified      Comments Sanil is scheduled to start the PR program on 12/26. No new barriers or concerns since orientation on 12/20. Kaelub continues to deny any psychosocial barriers or concerns at this time.      Expected Outcomes For Gervis to participate in PR free of any psychosocial barriers or concerns. For Romualdo to participate in PR free of any psychosocial barriers or concerns.      Interventions Encouraged to attend Pulmonary Rehabilitation for the exercise Encouraged to attend Pulmonary Rehabilitation for the exercise      Continue Psychosocial Services  No Follow up required No Follow up required               Psychosocial Discharge (Final Psychosocial Re-Evaluation):  Psychosocial Re-Evaluation - 03/05/23 1054       Psychosocial Re-Evaluation   Current issues with None Identified    Comments Jennifer continues to deny any psychosocial barriers or concerns at this time.    Expected Outcomes For Meko to participate in PR free of any psychosocial barriers or concerns.    Interventions Encouraged to attend Pulmonary Rehabilitation for the exercise    Continue Psychosocial Services  No Follow up required             Education: Education Goals: Education classes will be provided on a weekly basis, covering required topics. Participant will state understanding/return demonstration of topics presented.  Learning Barriers/Preferences:  Learning  Barriers/Preferences - 02/05/23 1341       Learning Barriers/Preferences   Learning Barriers None    Learning Preferences Pictoral;Group Instruction             Education Topics: Know Your Numbers Group instruction that is supported by  a PowerPoint presentation. Instructor discusses importance of knowing and understanding resting, exercise, and post-exercise oxygen saturation, heart rate, and blood pressure. Oxygen saturation, heart rate, blood pressure, rating of perceived exertion, and dyspnea are reviewed along with a normal range for these values.  Flowsheet Row PULMONARY REHAB OTHER RESPIRATORY from 02/18/2023 in [facility] for Heart, Vascular, & Lung Health  Date 02/18/23  Educator EP  Instruction Review Code 1- Verbalizes Understanding       Exercise for the Pulmonary Patient Group instruction that is supported by a PowerPoint presentation. Instructor discusses benefits of exercise, core components of exercise, frequency, duration, and intensity of an exercise routine, importance of utilizing pulse oximetry during exercise, safety while exercising, and options of places to exercise outside of rehab.  Flowsheet Row PULMONARY REHAB OTHER RESPIRATORY from 02/11/2023 in [facility] for Heart, Vascular, & Lung Health  Date 02/11/23  Educator EP  Instruction Review Code 1- Verbalizes Understanding       MET Level  Group instruction provided by PowerPoint, verbal discussion, and written material to support subject matter. Instructor reviews what METs are and how to increase METs.    Pulmonary Medications Verbally interactive group education provided by instructor with focus on inhaled medications and proper administration.   Anatomy and Physiology of the Respiratory System Group instruction provided by PowerPoint, verbal discussion, and written material to support subject matter. Instructor reviews respiratory cycle and  anatomical components of the respiratory system and their functions. Instructor also reviews differences in obstructive and restrictive respiratory diseases with examples of each.    Oxygen Safety Group instruction provided by PowerPoint, verbal discussion, and written material to support subject matter. There is an overview of "What is Oxygen" and "Why do we need it".  Instructor also reviews how to create a safe environment for oxygen use, the importance of using oxygen as prescribed, and the risks of noncompliance. There is a brief discussion on traveling with oxygen and resources the patient may utilize. Flowsheet Row PULMONARY REHAB OTHER RESPIRATORY from 02/25/2023 in [facility] for Heart, Vascular, & Lung Health  Date 02/25/23  Educator RN  Instruction Review Code 1- Verbalizes Understanding       Oxygen Use Group instruction provided by PowerPoint, verbal discussion, and written material to discuss how supplemental oxygen is prescribed and different types of oxygen supply systems. Resources for more information are provided.  Flowsheet Row PULMONARY REHAB OTHER RESPIRATORY from 03/04/2023 in [facility] for Heart, Vascular, & Lung Health  Date 03/04/23  Educator RT  Instruction Review Code 1- Verbalizes Understanding       Breathing Techniques Group instruction that is supported by demonstration and informational handouts. Instructor discusses the benefits of pursed lip and diaphragmatic breathing and detailed demonstration on how to perform both.     Risk Factor Reduction Group instruction that is supported by a PowerPoint presentation. Instructor discusses the definition of a risk factor, different risk factors for pulmonary disease, and how the heart and lungs work together.   Pulmonary Diseases Group instruction provided by PowerPoint, verbal discussion, and written material to support subject matter. Instructor gives an  overview of the different type of pulmonary diseases. There is also a discussion on risk factors and symptoms as well as ways to manage the diseases.   Stress and Energy Conservation Group instruction provided by PowerPoint, verbal discussion, and written material to support subject matter. Instructor gives an overview of stress and the  impact it can have on the body. Instructor also reviews ways to reduce stress. There is also a discussion on energy conservation and ways to conserve energy throughout the day.   Warning Signs and Symptoms Group instruction provided by PowerPoint, verbal discussion, and written material to support subject matter. Instructor reviews warning signs and symptoms of stroke, heart attack, cold and flu. Instructor also reviews ways to prevent the spread of infection.   Other Education Group or individual verbal, written, or video instructions that support the educational goals of the pulmonary rehab program.    Knowledge Questionnaire Score:  Knowledge Questionnaire Score - 02/05/23 1453       Knowledge Questionnaire Score   Pre Score 11/18             Core Components/Risk Factors/Patient Goals at Admission:  Personal Goals and Risk Factors at Admission - 02/05/23 1342       Core Components/Risk Factors/Patient Goals on Admission   Improve shortness of breath with ADL's Yes    Intervention Provide education, individualized exercise plan and daily activity instruction to help decrease symptoms of SOB with activities of daily living.    Expected Outcomes Short Term: Improve cardiorespiratory fitness to achieve a reduction of symptoms when performing ADLs;Long Term: Be able to perform more ADLs without symptoms or delay the onset of symptoms    Heart Failure Yes    Intervention Provide a combined exercise and nutrition program that is supplemented with education, support and counseling about heart failure. Directed toward relieving symptoms such as  shortness of breath, decreased exercise tolerance, and extremity edema.    Expected Outcomes Improve functional capacity of life;Short term: Attendance in program 2-3 days a week with increased exercise capacity. Reported lower sodium intake. Reported increased fruit and vegetable intake. Reports medication compliance.;Short term: Daily weights obtained and reported for increase. Utilizing diuretic protocols set by physician.;Long term: Adoption of self-care skills and reduction of barriers for early signs and symptoms recognition and intervention leading to self-care maintenance.             Core Components/Risk Factors/Patient Goals Review:   Goals and Risk Factor Review     Row Name 02/08/23 0902 03/05/23 1100           Core Components/Risk Factors/Patient Goals Review   Personal Goals Review Improve shortness of breath with ADL's;Develop more efficient breathing techniques such as purse lipped breathing and diaphragmatic breathing and practicing self-pacing with activity.;Heart Failure Improve shortness of breath with ADL's;Develop more efficient breathing techniques such as purse lipped breathing and diaphragmatic breathing and practicing self-pacing with activity.;Heart Failure      Review Rawland is scheduled to start the PR program on 12/26. We will continue to monitor his progress toward his goals throughout the program. Goal progressing on improving his shortness of breath with ADLs. Goal progressing on developing more efficient breathing techniques such as purse lipped breathing and diaphragmatic breathing; and practicing self-pacing with activity. Goal progressing for heart failure. Levin is currently requiring 3-4L of O2 to keep sats >88% while exercising. He has attended 7 sessions so far and can already tell a difference in how he feels. We will continue to monitor his progress throughout the program.      Expected Outcomes See admission goal To improve shortness of breath  with ADL's, develop more efficient breathing techniques such as purse lipped breathing and diaphragmatic breathing; and practicing self-pacing with activity and have controlled heart failure.  Core Components/Risk Factors/Patient Goals at Discharge (Final Review):   Goals and Risk Factor Review - 03/05/23 1100       Core Components/Risk Factors/Patient Goals Review   Personal Goals Review Improve shortness of breath with ADL's;Develop more efficient breathing techniques such as purse lipped breathing and diaphragmatic breathing and practicing self-pacing with activity.;Heart Failure    Review Goal progressing on improving his shortness of breath with ADLs. Goal progressing on developing more efficient breathing techniques such as purse lipped breathing and diaphragmatic breathing; and practicing self-pacing with activity. Goal progressing for heart failure. Hermilo is currently requiring 3-4L of O2 to keep sats >88% while exercising. He has attended 7 sessions so far and can already tell a difference in how he feels. We will continue to monitor his progress throughout the program.    Expected Outcomes To improve shortness of breath with ADL's, develop more efficient breathing techniques such as purse lipped breathing and diaphragmatic breathing; and practicing self-pacing with activity and have controlled heart failure.             ITP Comments:Pt is making expected progress toward Pulmonary Rehab goals after completing 8 session(s). Recommend continued exercise, life style modification, education, and utilization of breathing techniques to increase stamina and strength, while also decreasing shortness of breath with exertion.  Dr. Mechele Collin is Medical Director for Pulmonary Rehab at [facility].

## 2023-03-11 ENCOUNTER — Encounter (HOSPITAL_COMMUNITY)
Admission: RE | Admit: 2023-03-11 | Discharge: 2023-03-11 | Disposition: A | Discharge status: Home / Self Care | Payer: Medicare HMO | Source: Ambulatory Visit | Attending: Pulmonary Disease | Admitting: Pulmonary Disease

## 2023-03-11 DIAGNOSIS — I5042 Chronic combined systolic (congestive) and diastolic (congestive) heart failure: Secondary | ICD-10-CM | POA: Diagnosis not present

## 2023-03-11 NOTE — Progress Notes (Signed)
Daily Session Note  Patient Details  Name: Thomas [name] MRN: [id number] Date of Birth: 18-Feb-1950 Referring Provider:   Doristine Devoid Pulmonary Rehab Walk Test from 02/05/2023 in [facility] for Heart, Vascular, & Lung Health  Referring Provider Sood       Encounter Date: 03/11/2023  Check In:  Session Check In - 03/11/23 1112       Check-In   Supervising physician immediately available to respond to emergencies [facility] MD immediately available    Physician(s) Neila Gear, NP    Location [facility]    Staff Present Essie Hart, RN, BSN;Casey Smith, Zella Richer, MS, ACSM-CEP, Exercise Physiologist;David Manus Gunning, MS, ACSM-CEP, CCRP, Exercise Physiologist;Bailey Wallace Cullens, MS, Exercise Physiologist    Virtual Visit No    Medication changes reported     No    Fall or balance concerns reported    Yes    Comments uses wheelchair, cane, and walking stick    Tobacco Cessation No Change    Warm-up and Cool-down Performed as group-led instruction   only   Resistance Training Performed Yes    VAD Patient? No    PAD/SET Patient? No      Pain Assessment   Currently in Pain? No/denies    Multiple Pain Sites No             Capillary Blood Glucose: No results found for this or any previous visit (from the past 24 hours).    Social History   Tobacco Use  Smoking Status Never   Passive exposure: Never  Smokeless Tobacco Never    Goals Met:  Proper associated with RPD/PD & O2 Sat Exercise tolerated well No report of concerns or symptoms today Strength training completed today  Goals Unmet:  Not Applicable  Comments: Service time is from 1009 to 1143.    Dr. Mechele Collin is Medical Director for Pulmonary Rehab at [facility].

## 2023-03-15 DIAGNOSIS — R339 Retention of urine, unspecified: Secondary | ICD-10-CM | POA: Diagnosis not present

## 2023-03-16 ENCOUNTER — Encounter (HOSPITAL_COMMUNITY)
Admission: RE | Admit: 2023-03-16 | Discharge: 2023-03-16 | Disposition: A | Discharge status: Home / Self Care | Payer: Medicare HMO | Source: Ambulatory Visit | Attending: Pulmonary Disease | Admitting: Pulmonary Disease

## 2023-03-16 DIAGNOSIS — I5042 Chronic combined systolic (congestive) and diastolic (congestive) heart failure: Secondary | ICD-10-CM

## 2023-03-16 NOTE — Progress Notes (Signed)
Daily Session Note  Patient Details  Name: Thomas [name] MRN: [id number] Date of Birth: 1950/02/19 Referring Provider:   Doristine Devoid Pulmonary Rehab Walk Test from 02/05/2023 in [facility] for Heart, Vascular, & Lung Health  Referring Provider Sood       Encounter Date: 03/16/2023  Check In:  Session Check In - 03/16/23 1032       Check-In   Supervising physician immediately available to respond to emergencies [facility] MD immediately available    Physician(s) Tereso Newcomer, PA    Location [facility]    Staff Present Essie Hart, RN, BSN;Casey Katrinka Blazing, Zella Richer, MS, ACSM-CEP, Exercise Physiologist;Olinty Peggye Pitt, MS, ACSM-CEP, Exercise Physiologist    Virtual Visit No    Medication changes reported     No    Fall or balance concerns reported    Yes    Comments uses wheelchair, cane, and walking stick    Tobacco Cessation No Change    Warm-up and Cool-down Performed as group-led instruction   only   Resistance Training Performed Yes    VAD Patient? No    PAD/SET Patient? No      Pain Assessment   Currently in Pain? No/denies    Multiple Pain Sites No             Capillary Blood Glucose: No results found for this or any previous visit (from the past 24 hours).    Social History   Tobacco Use  Smoking Status Never   Passive exposure: Never  Smokeless Tobacco Never    Goals Met:  Independence with exercise equipment Exercise tolerated well No report of concerns or symptoms today Strength training completed today  Goals Unmet:  Not Applicable  Comments: Service time is from 1006 to 1128    Dr. Mechele Collin is Medical Director for Pulmonary Rehab at [facility].

## 2023-03-18 ENCOUNTER — Encounter (HOSPITAL_COMMUNITY)
Admission: RE | Admit: 2023-03-18 | Discharge: 2023-03-18 | Disposition: A | Discharge status: Home / Self Care | Payer: Medicare HMO | Source: Ambulatory Visit | Attending: Pulmonary Disease | Admitting: Pulmonary Disease

## 2023-03-18 ENCOUNTER — Other Ambulatory Visit (HOSPITAL_COMMUNITY): Payer: Self-pay

## 2023-03-18 DIAGNOSIS — I5042 Chronic combined systolic (congestive) and diastolic (congestive) heart failure: Secondary | ICD-10-CM

## 2023-03-18 NOTE — Progress Notes (Signed)
Daily Session Note  Patient Details  Name: Thomas [name] MRN: [id number] Date of Birth: 28-Aug-1950 Referring Provider:   Doristine Devoid Pulmonary Rehab Walk Test from 02/05/2023 in [facility] for Heart, Vascular, & Lung Health  Referring Provider Sood       Encounter Date: 03/18/2023  Check In:  Session Check In - 03/18/23 1028       Check-In   Supervising physician immediately available to respond to emergencies [facility] immediately available    Physician(s) Edd Fabian, NP    Location [facility]    Staff Present Essie Hart, RN, BSN;Casey Katrinka Blazing, Zella Richer, MS, ACSM-CEP, Exercise Physiologist;Jetta Walker BS, ACSM-CEP, Exercise Physiologist    Virtual Visit No    Medication changes reported     No    Fall or balance concerns reported    Yes    Comments uses wheelchair, cane, and walking stick    Tobacco Cessation No Change    Warm-up and Cool-down Performed as group-led instruction   only   Resistance Training Performed Yes    VAD Patient? No    PAD/SET Patient? No      Pain Assessment   Currently in Pain? No/denies    Multiple Pain Sites No             Capillary Blood Glucose: No results found for this or any previous visit (from the past 24 hours).    Social History   Tobacco Use  Smoking Status Never   Passive exposure: Never  Smokeless Tobacco Never    Goals Met:  Proper associated with RPD/PD & O2 Sat Exercise tolerated well No report of concerns or symptoms today Strength training completed today  Goals Unmet:  Not Applicable  Comments: Service time is from 1012 to 1147.    Dr. Mechele Collin is Medical Director for Pulmonary Rehab at [facility].

## 2023-03-22 ENCOUNTER — Other Ambulatory Visit (HOSPITAL_COMMUNITY): Payer: Self-pay

## 2023-03-23 ENCOUNTER — Other Ambulatory Visit: Payer: Self-pay

## 2023-03-23 ENCOUNTER — Encounter (HOSPITAL_COMMUNITY)
Admission: RE | Admit: 2023-03-23 | Discharge: 2023-03-23 | Disposition: A | Discharge status: Home / Self Care | Payer: Medicare HMO | Source: Ambulatory Visit | Attending: Pulmonary Disease | Admitting: Pulmonary Disease

## 2023-03-23 VITALS — Wt 305.1 lb

## 2023-03-23 DIAGNOSIS — I5042 Chronic combined systolic (congestive) and diastolic (congestive) heart failure: Secondary | ICD-10-CM | POA: Insufficient documentation

## 2023-03-23 NOTE — Progress Notes (Signed)
Daily Session Note  Patient Details  Name: Thomas [name] MRN: [id number] Date of Birth: [birth year] Referring Provider:   Doristine Devoid Pulmonary Rehab Walk Test from 02/05/2023 in [facility] for Heart, Vascular, & Lung Health  Referring Provider Sood       Encounter Date: 03/23/2023  Check In:  Session Check In - 03/23/23 1026       Check-In   Supervising physician immediately available to respond to emergencies [facility] immediately available    Physician(s) Edd Fabian, NP    [facility]    Staff Present Essie Hart, RN, BSN;Sohan Potvin Katrinka Blazing, Zella Richer, MS, ACSM-CEP, Exercise Physiologist;Randi Idelle Crouch BS, ACSM-CEP, Exercise Physiologist    Virtual Visit No    Medication changes reported     No    Fall or balance concerns reported    Yes    Comments uses wheelchair, cane, and walking stick    Tobacco Cessation No Change    Warm-up and Cool-down Performed as group-led instruction   only   Resistance Training Performed Yes    VAD Patient? No    PAD/SET Patient? No      Pain Assessment   Currently in Pain? No/denies    Multiple Pain Sites No             Capillary Blood Glucose: No results found for this or any previous visit (from the past 24 hours).   Exercise Prescription Changes - 03/23/23 1200       Response to Exercise   Blood Pressure (Admit) 101/63    Blood Pressure (Exercise) 118/76    Blood Pressure (Exit) 111/69    Heart Rate (Admit) 78 bpm    Heart Rate (Exercise) 93 bpm    Heart Rate (Exit) 80 bpm    Oxygen Saturation (Admit) 99 %    Oxygen Saturation (Exercise) 93 %    Oxygen Saturation (Exit) 95 %    Rating of Perceived Exertion (Exercise) 13    Perceived Dyspnea (Exercise) 2    Duration Continue with 30 min of aerobic exercise without signs/symptoms of physical distress.    Intensity THRR unchanged      Progression   Progression Continue to progress workloads to maintain intensity  without signs/symptoms of physical distress.      Resistance Training   Training Prescription Yes    Weight blue bands    Reps 10-15    Time 10 Minutes      Oxygen   Oxygen Continuous    Liters 3-4      NuStep   Level 8    SPM 72    Minutes 30    METs 2.3      Oxygen   Maintain Oxygen Saturation 88% or higher             Social History   Tobacco Use  Smoking Status Never   Passive exposure: Never  Smokeless Tobacco Never    Goals Met:  Proper associated with RPD/PD & O2 Sat Independence with exercise equipment Exercise tolerated well No report of concerns or symptoms today Strength training completed today  Goals Unmet:  Not Applicable  Comments: Service time is from 1015 to 1145.    Dr. Mechele Collin is Medical Director for Pulmonary Rehab at [facility].

## 2023-03-25 ENCOUNTER — Encounter (HOSPITAL_COMMUNITY)
Admission: RE | Admit: 2023-03-25 | Discharge: 2023-03-25 | Disposition: A | Discharge status: Home / Self Care | Payer: Medicare HMO | Source: Ambulatory Visit | Attending: Pulmonary Disease | Admitting: Pulmonary Disease

## 2023-03-25 DIAGNOSIS — I5042 Chronic combined systolic (congestive) and diastolic (congestive) heart failure: Secondary | ICD-10-CM

## 2023-03-25 NOTE — Progress Notes (Signed)
 Daily Session Note  Patient Details  Name: Thomas [name] MRN: [id number] Date of Birth: 1950-12-06 Referring Provider:   Conrad Ports Pulmonary Rehab Walk Test from 02/05/2023 in [facility] for Heart, Vascular, & Lung Health  Referring Provider Sood       Encounter Date: 03/25/2023  Check In:  Session Check In - 03/25/23 1022       Check-In   Supervising physician immediately available to respond to emergencies [facility] immediately available    Physician(s) Rosabel Mose, NP    Location [facility]    Staff Present Ronal Levin, RN, BSN;Shanele Nissan Claudene, Neita Moats, MS, ACSM-CEP, Exercise Physiologist;Randi Midge BS, ACSM-CEP, Exercise Physiologist    Virtual Visit No    Medication changes reported     No    Fall or balance concerns reported    Yes    Comments uses wheelchair, cane, and walking stick    Tobacco Cessation No Change    Warm-up and Cool-down Performed as group-led instruction   only   Resistance Training Performed Yes    VAD Patient? No    PAD/SET Patient? No      Pain Assessment   Currently in Pain? No/denies    Multiple Pain Sites No             Capillary Blood Glucose: No results found for this or any previous visit (from the past 24 hours).    Social History   Tobacco Use  Smoking Status Never   Passive exposure: Never  Smokeless Tobacco Never    Goals Met:  Proper associated with RPD/PD & O2 Sat Independence with exercise equipment Exercise tolerated well No report of concerns or symptoms today Strength training completed today  Goals Unmet:  Not Applicable  Comments: Service time is from 0948 to 1130.    Dr. Slater Staff is Medical Director for Pulmonary Rehab at [facility].

## 2023-03-30 ENCOUNTER — Encounter (HOSPITAL_COMMUNITY)
Admission: RE | Admit: 2023-03-30 | Discharge: 2023-03-30 | Disposition: A | Discharge status: Home / Self Care | Payer: Medicare HMO | Source: Ambulatory Visit | Attending: Pulmonary Disease | Admitting: Pulmonary Disease

## 2023-03-30 VITALS — Wt 304.0 lb

## 2023-03-30 DIAGNOSIS — I5042 Chronic combined systolic (congestive) and diastolic (congestive) heart failure: Secondary | ICD-10-CM

## 2023-03-30 NOTE — Progress Notes (Signed)
Daily Session Note  Patient Details  Name: Thomas [name] MRN: [id number] Date of Birth: November 03, 1950 Referring Provider:   Doristine Devoid Pulmonary Rehab Walk Test from 02/05/2023 in [facility] for Heart, Vascular, & Lung Health  Referring Provider Sood       Encounter Date: 03/30/2023  Check In:  Session Check In - 03/30/23 1032       Check-In   Supervising physician immediately available to respond to emergencies [facility] MD immediately available    Physician(s) Eligha Bridegroom, NP    [facility]    Staff Present Essie Hart, RN, BSN;Casey Katrinka Blazing, Zella Richer, MS, ACSM-CEP, Exercise Physiologist;Randi Idelle Crouch BS, ACSM-CEP, Exercise Physiologist    Virtual Visit No    Medication changes reported     No    Fall or balance concerns reported    No    Tobacco Cessation No Change    Warm-up and Cool-down Performed as group-led instruction    Resistance Training Performed Yes    VAD Patient? No    PAD/SET Patient? No      Pain Assessment   Currently in Pain? No/denies             Capillary Blood Glucose: No results found for this or any previous visit (from the past 24 hours).    Social History   Tobacco Use  Smoking Status Never   Passive exposure: Never  Smokeless Tobacco Never    Goals Met:  Independence with exercise equipment Exercise tolerated well No report of concerns or symptoms today Strength training completed today  Goals Unmet:  Not Applicable  Comments: Service time is from 1009 to 1134    Dr. Mechele Collin is Medical Director for Pulmonary Rehab at [facility].

## 2023-04-01 ENCOUNTER — Encounter (HOSPITAL_COMMUNITY)
Admission: RE | Admit: 2023-04-01 | Discharge: 2023-04-01 | Disposition: A | Discharge status: Home / Self Care | Payer: Medicare HMO | Source: Ambulatory Visit | Attending: Pulmonary Disease | Admitting: Pulmonary Disease

## 2023-04-01 DIAGNOSIS — I5042 Chronic combined systolic (congestive) and diastolic (congestive) heart failure: Secondary | ICD-10-CM

## 2023-04-06 ENCOUNTER — Encounter (HOSPITAL_COMMUNITY)
Admission: RE | Admit: 2023-04-06 | Discharge: 2023-04-06 | Disposition: A | Discharge status: Home / Self Care | Payer: Medicare HMO | Source: Ambulatory Visit | Attending: Pulmonary Disease | Admitting: Pulmonary Disease

## 2023-04-06 DIAGNOSIS — I5042 Chronic combined systolic (congestive) and diastolic (congestive) heart failure: Secondary | ICD-10-CM

## 2023-04-07 ENCOUNTER — Telehealth (HOSPITAL_COMMUNITY): Payer: Self-pay

## 2023-04-07 NOTE — Telephone Encounter (Signed)
LVM checking on pt after missing the last 3 Pulm Rehab sessions.

## 2023-04-07 NOTE — Progress Notes (Signed)
Pulmonary Individual Treatment Plan  Patient Details  Name: Thomas [name] MRN: [id number] Date of Birth: [birth year] Referring Provider:   Doristine Devoid Pulmonary Rehab Walk Test from 02/05/2023 in [facility]  Referring Provider Sood       Initial Encounter Date:  Flowsheet Row Pulmonary Rehab Walk Test from 02/05/2023 in [facility]  Date 02/05/23       Visit Diagnosis: Chronic combined systolic (congestive) and diastolic (congestive) heart failure (HCC)  Patient's Home Medications on Admission:   Current Outpatient Medications:    Accu-Chek Softclix Lancets lancets, Use to test blood sugar daily, Disp: 200 each, Rfl: 3   acetaminophen (TYLENOL) 500 MG tablet, Take by mouth., Disp: , Rfl:    Alcohol Swabs (B-D SINGLE USE SWABS REGULAR) PADS, Use in testing blood sugar daily. Dx: E11.22, Disp: 100 each, Rfl: 3   amLODipine (NORVASC) 10 MG tablet, TAKE 1 TABLET EVERY DAY FOR HIGH BLOOD PRESSURE, Disp: 90 tablet, Rfl: 3   aspirin EC 81 MG tablet, Take 81 mg by mouth daily., Disp: , Rfl:    atorvastatin (LIPITOR) 40 MG tablet, TAKE 1 TABLET EVERY DAY, Disp: 90 tablet, Rfl: 3   Blood Glucose Calibration (ACCU-CHEK AVIVA) SOLN, Use once daily as directed dx E11.22, Disp: 1 each, Rfl: 2   Blood Glucose Monitoring Suppl (TRUE METRIX AIR GLUCOSE METER) w/Device KIT, 1 Device by Does not apply route daily as needed. E11.22, Disp: 1 kit, Rfl: 0   carvedilol (COREG) 12.5 MG tablet, TAKE 1 TABLET TWICE DAILY WITH MEALS, Disp: 180 tablet, Rfl: 3   Cholecalciferol (VITAMIN D3) 50 MCG (2000 UT) TABS, Take 1 tablet (2,000 Units) by mouth daily., Disp: 10 tablet, Rfl: 0   empagliflozin (JARDIANCE) 10 MG TABS tablet, Take 1 tablet (10 mg total) by mouth daily., Disp: 30 tablet, Rfl: 0   furosemide (LASIX) 20 MG tablet, TAKE 1 TABLET AS DIRECTED AS NEEDED, Disp: 90 tablet, Rfl:  3   glucose blood (ACCU-CHEK AVIVA PLUS) test strip, Use to test blood sugar daily., Disp: 100 each, Rfl: 3   hydrALAZINE (APRESOLINE) 25 MG tablet, TAKE 3 TABLETS THREE TIMES DAILY, Disp: 810 tablet, Rfl: 3   Lancets Misc. (ACCU-CHEK SOFTCLIX LANCET DEV) KIT, Use to test blood sugar daily, Disp: 1 kit, Rfl: 0   losartan (COZAAR) 50 MG tablet, TAKE 1 TABLET EVERY DAY, Disp: 90 tablet, Rfl: 3   metFORMIN (GLUCOPHAGE) 500 MG tablet, TAKE 1 TABLET TWICE DAILY WITH MEALS, Disp: 180 tablet, Rfl: 3   potassium chloride SA (KLOR-CON M) 20 MEQ tablet, TAKE 1 TABLET TWICE DAILY, Disp: 180 tablet, Rfl: 3   rivaroxaban (XARELTO) 20 MG TABS tablet, Take 1 tablet (20 mg total) by mouth daily with supper., Disp: 30 tablet, Rfl: 12   Senna 8.7 MG CHEW, Chew 1 tablet by mouth as needed., Disp: 90 tablet, Rfl: 1   spironolactone (ALDACTONE) 50 MG tablet, Take 50 mg by mouth daily., Disp: 90 tablet, Rfl: 3   tirzepatide (MOUNJARO) 7.5 MG/0.5ML Pen, Inject 7.5 mg into the skin once a week., Disp: 2 mL, Rfl: 1   traMADol (ULTRAM) 50 MG tablet, Take 1 tablet (50 mg total) by mouth every 6 (six) hours as needed for pain, Disp: 30 tablet, Rfl: 0   TRUEplus Lancets 28G MISC, TEST BLOOD SUGAR EVERY DAY AS NEEDED, Disp: 100 each, Rfl: 3   zinc gluconate 50 MG tablet, Take  1 tablet (50 mg total) by mouth daily., Disp: 14 tablet, Rfl: 0  Past Medical History: Past Medical History:  Diagnosis Date   Acute on chronic diastolic CHF (congestive heart failure) (HCC) 06/01/2017   Arthritis    Cancer (HCC) 2010   Prostate   Chronic kidney disease    Diabetes mellitus    Diabetic neuropathy (HCC)    feet   DVT (deep venous thrombosis) (HCC)    Genetic testing 06/22/2016   Mr. Bachar underwent genetic counseling and testing for hereditary cancer syndromes on 05/14/2016. His results were negative for mutations in all 46 genes analyzed by [facility]'s 46-gene Common Hereditary Cancers Panel. Genes analyzed include: APC, ATM,  AXIN2, BARD1, BMPR1A, BRCA1, BRCA2, BRIP1, CDH1, CDKN2A, CHEK2, CTNNA1, DICER1, EPCAM, GREM1, HOXB13, KIT, MEN1, MLH1, MSH2, MSH3, MSH6, MUTYH, NB   GERD (gastroesophageal reflux disease)    Hypertension    PE (pulmonary thromboembolism) (HCC)    Pneumonia    Sleep apnea    not wearing CPAP   Suprapubic catheter (HCC) 11/17/2019    Tobacco Use: Social History   Tobacco Use  Smoking Status Never   Passive exposure: Never  Smokeless Tobacco Never    Labs: Review Flowsheet  More data exists      Latest Ref Rng & Units 05/19/2021 09/29/2021 03/30/2022 07/03/2022 11/03/2022  Labs for ITP Cardiac and Pulmonary Rehab  Cholestrol 100 - 199 mg/dL 841  - 324  - 401   LDL (calc) 0 - 99 mg/dL 47  - 62  - 48   HDL-C >39 mg/dL 52  - 58  - 46   Trlycerides 0 - 149 mg/dL 65  - 84  - 75   Hemoglobin A1c 4.8 - 5.6 % 6.1  7.2  8.6  6.5  5.6     Capillary Blood Glucose: Lab Results  Component Value Date   GLUCAP 206 (H) 02/16/2023   GLUCAP 211 (H) 02/16/2023   GLUCAP 214 (H) 02/11/2023   GLUCAP 203 (H) 02/11/2023   GLUCAP 180 (H) 10/20/2018     Pulmonary Assessment Scores:  Pulmonary Assessment Scores     Row Name 02/05/23 1447         ADL UCSD   ADL Phase Entry     SOB Score total 66       CAT Score   CAT Score 22       mMRC Score   mMRC Score 4             UCSD: Self-administered rating of dyspnea associated with activities of daily living (ADLs) 6-point scale (0 = "not at all" to 5 = "maximal or unable to do because of breathlessness")  Scoring Scores range from 0 to 120.  Minimally important difference is 5 units  CAT: CAT can identify the health impairment of COPD patients and is better correlated with disease progression.  CAT has a scoring range of zero to 40. The CAT score is classified into four groups of low (less than 10), medium (10 - 20), high (21-30) and very high (31-40) based on the impact level of disease on health status. A CAT score over 10 suggests  significant symptoms.  A worsening CAT score could be explained by an exacerbation, poor medication adherence, poor inhaler technique, or progression of COPD or comorbid conditions.  CAT MCID is 2 points  mMRC: mMRC (Modified Medical Research Council) Dyspnea Scale is used to assess the degree of baseline functional disability in patients of respiratory disease due  to dyspnea. No minimal important difference is established. A decrease in score of 1 point or greater is considered a positive change.   Pulmonary Function Assessment:  Pulmonary Function Assessment - 02/05/23 1338       Breath   Bilateral Breath Sounds Clear    Shortness of Breath Yes;Limiting activity             Exercise Target Goals: Exercise Program Goal: Individual exercise prescription set using results from initial 6 min walk test and THRR while considering  patient's activity barriers and safety.   Exercise Prescription Goal: Initial exercise prescription builds to 30-45 minutes a day of aerobic activity, 2-3 days per week.  Home exercise guidelines will be given to patient during program as part of exercise prescription that the participant will acknowledge.  Activity Barriers & Risk Stratification:  Activity Barriers & Cardiac Risk Stratification - 02/05/23 1336       Activity Barriers & Cardiac Risk Stratification   Activity Barriers Right Knee Replacement;Left Knee Replacement;Muscular Weakness;Deconditioning;Joint Problems;Balance Concerns;Shortness of Breath;Assistive Device             6 Minute Walk:  6 Minute Walk     Row Name 02/05/23 1438         6 Minute Walk   Phase Initial  Nustep test     Distance 1584 feet     Walk Time 6 minutes     # of Rest Breaks 0     MPH 3     METS 2.61     RPE 11     Perceived Dyspnea  1     VO2 Peak 9.13     Symptoms No     Resting HR 61 bpm     Resting BP 122/62     Resting Oxygen Saturation  94 %     Exercise Oxygen Saturation  during 6 min  walk 87 %     Max Ex. HR 123 bpm     Max Ex. BP 122/64     2 Minute Post BP 120/60       Interval HR   1 Minute HR 84     2 Minute HR 89     3 Minute HR 97     4 Minute HR 99     5 Minute HR 89     6 Minute HR 123     2 Minute Post HR 64     Interval Heart Rate? Yes       Interval Oxygen   Interval Oxygen? Yes     Baseline Oxygen Saturation % 94 %     1 Minute Oxygen Saturation % 90 %     1 Minute Liters of Oxygen 0 L     2 Minute Oxygen Saturation % 89 %     2 Minute Liters of Oxygen 0 L     3 Minute Oxygen Saturation % 90 %     3 Minute Liters of Oxygen 0 L     4 Minute Oxygen Saturation % 89 %     4 Minute Liters of Oxygen 0 L     5 Minute Oxygen Saturation % 88 %     5 Minute Liters of Oxygen 0 L     6 Minute Oxygen Saturation % 88 %  87% @ 5:39     6 Minute Liters of Oxygen 2 L     2 Minute Post Oxygen Saturation % 96 %  2 Minute Post Liters of Oxygen 2 L              Oxygen Initial Assessment:  Oxygen Initial Assessment - 02/05/23 1337       Home Oxygen   Home Oxygen Device E-Tanks;Home Concentrator    Sleep Oxygen Prescription CPAP    Liters per minute 0    Home Exercise Oxygen Prescription Continuous    Liters per minute 2    Home Resting Oxygen Prescription None    Compliance with Home Oxygen Use Yes      Initial 6 min Walk   Oxygen Used Continuous    Liters per minute 2      Program Oxygen Prescription   Program Oxygen Prescription Continuous    Liters per minute 2      Intervention   Short Term Goals To learn and exhibit compliance with exercise, home and travel O2 prescription;To learn and understand importance of monitoring SPO2 with pulse oximeter and demonstrate accurate use of the pulse oximeter.;To learn and understand importance of maintaining oxygen saturations>88%;To learn and demonstrate proper pursed lip breathing techniques or other breathing techniques.     Long  Term Goals Exhibits compliance with exercise, home  and travel O2  prescription;Verbalizes importance of monitoring SPO2 with pulse oximeter and return demonstration;Maintenance of O2 saturations>88%;Exhibits proper breathing techniques, such as pursed lip breathing or other method taught during program session             Oxygen Re-Evaluation:  Oxygen Re-Evaluation     Row Name 02/05/23 1337 03/05/23 1441 04/02/23 1154         Program Oxygen Prescription   Program Oxygen Prescription -- Continuous Continuous     Liters per minute -- 2 2       Home Oxygen   Home Oxygen Device -- E-Tanks;Home Concentrator E-Tanks;Home Concentrator     Sleep Oxygen Prescription -- CPAP CPAP     Liters per minute -- 0 0     Home Exercise Oxygen Prescription -- Continuous Continuous     Liters per minute -- 2 2     Home Resting Oxygen Prescription -- None None     Compliance with Home Oxygen Use -- Yes Yes       Goals/Expected Outcomes   Short Term Goals -- To learn and exhibit compliance with exercise, home and travel O2 prescription;To learn and understand importance of monitoring SPO2 with pulse oximeter and demonstrate accurate use of the pulse oximeter.;To learn and understand importance of maintaining oxygen saturations>88%;To learn and demonstrate proper pursed lip breathing techniques or other breathing techniques.  To learn and exhibit compliance with exercise, home and travel O2 prescription;To learn and understand importance of monitoring SPO2 with pulse oximeter and demonstrate accurate use of the pulse oximeter.;To learn and understand importance of maintaining oxygen saturations>88%;To learn and demonstrate proper pursed lip breathing techniques or other breathing techniques.      Long  Term Goals -- Exhibits compliance with exercise, home  and travel O2 prescription;Verbalizes importance of monitoring SPO2 with pulse oximeter and return demonstration;Maintenance of O2 saturations>88%;Exhibits proper breathing techniques, such as pursed lip breathing or other  method taught during program session Exhibits compliance with exercise, home  and travel O2 prescription;Verbalizes importance of monitoring SPO2 with pulse oximeter and return demonstration;Maintenance of O2 saturations>88%;Exhibits proper breathing techniques, such as pursed lip breathing or other method taught during program session     Goals/Expected Outcomes Compliance and understanding of oxygen saturation monitoring and breathing techniques  to decrease shortness of breath. Compliance and understanding of oxygen saturation monitoring and breathing techniques to decrease shortness of breath. Compliance and understanding of oxygen saturation monitoring and breathing techniques to decrease shortness of breath.              Oxygen Discharge (Final Oxygen Re-Evaluation):  Oxygen Re-Evaluation - 04/02/23 1154       Program Oxygen Prescription   Program Oxygen Prescription Continuous    Liters per minute 2      Home Oxygen   Home Oxygen Device E-Tanks;Home Concentrator    Sleep Oxygen Prescription CPAP    Liters per minute 0    Home Exercise Oxygen Prescription Continuous    Liters per minute 2    Home Resting Oxygen Prescription None    Compliance with Home Oxygen Use Yes      Goals/Expected Outcomes   Short Term Goals To learn and exhibit compliance with exercise, home and travel O2 prescription;To learn and understand importance of monitoring SPO2 with pulse oximeter and demonstrate accurate use of the pulse oximeter.;To learn and understand importance of maintaining oxygen saturations>88%;To learn and demonstrate proper pursed lip breathing techniques or other breathing techniques.     Long  Term Goals Exhibits compliance with exercise, home  and travel O2 prescription;Verbalizes importance of monitoring SPO2 with pulse oximeter and return demonstration;Maintenance of O2 saturations>88%;Exhibits proper breathing techniques, such as pursed lip breathing or other method taught during  program session    Goals/Expected Outcomes Compliance and understanding of oxygen saturation monitoring and breathing techniques to decrease shortness of breath.             Initial Exercise Prescription:  Initial Exercise Prescription - 02/05/23 1400       Date of Initial Exercise RX and Referring Provider   Date 02/05/23    Referring Provider Sood    Expected Discharge Date 05/04/23      NuStep   Level 1    SPM 92    Minutes 30    METs 1.9      Prescription Details   Frequency (times per week) 2    Duration Progress to 30 minutes of continuous aerobic without signs/symptoms of physical distress      Intensity   THRR 40-80% of Max Heartrate 59-118    Ratings of Perceived Exertion 11-13    Perceived Dyspnea 0-4      Progression   Progression Continue to progress workloads to maintain intensity without signs/symptoms of physical distress.      Resistance Training   Training Prescription Yes    Weight blue bands    Reps 10-15             Perform Capillary Blood Glucose checks as needed.  Exercise Prescription Changes:   Exercise Prescription Changes     Row Name 02/23/23 1100 03/09/23 1200 03/23/23 1200 04/06/23 1200       Response to Exercise   Blood Pressure (Admit) 112/70 106/58 101/63 102/58    Blood Pressure (Exercise) 104/50 122/69 118/76 --    Blood Pressure (Exit) 98/56 128/52 111/69 118/62    Heart Rate (Admit) 61 bpm 73 bpm 78 bpm 84 bpm    Heart Rate (Exercise) 85 bpm 114 bpm 93 bpm 99 bpm    Heart Rate (Exit) 70 bpm 82 bpm 80 bpm 82 bpm    Oxygen Saturation (Admit) 93 %  room air 99 %  3L 99 % 99 %    Oxygen Saturation (Exercise) 89 %  2L 91 %  4L 93 % 90 %    Oxygen Saturation (Exit) 97 %  2L 98 %  3L 95 % 100 %    Rating of Perceived Exertion (Exercise) 11 13 13 13     Perceived Dyspnea (Exercise) 1 2 2 3     Duration Progress to 30 minutes of  aerobic without signs/symptoms of physical distress Continue with 30 min of aerobic exercise  without signs/symptoms of physical distress. Continue with 30 min of aerobic exercise without signs/symptoms of physical distress. Continue with 30 min of aerobic exercise without signs/symptoms of physical distress.    Intensity THRR unchanged THRR unchanged THRR unchanged THRR unchanged      Progression   Progression Continue to progress workloads to maintain intensity without signs/symptoms of physical distress. Continue to progress workloads to maintain intensity without signs/symptoms of physical distress. Continue to progress workloads to maintain intensity without signs/symptoms of physical distress. Continue to progress workloads to maintain intensity without signs/symptoms of physical distress.      Resistance Training   Training Prescription Yes Yes Yes Yes    Weight blue bands blue bands blue bands blue bands    Reps 10-15 10-15 10-15 10-15    Time 10 Minutes 10 Minutes 10 Minutes 10 Minutes      Interval Training   Interval Training No No -- --      Oxygen   Oxygen Continuous Continuous Continuous Continuous    Liters --  2-3L --  3-4L 3-4 4      NuStep   Level 3 6 8 9     SPM -- -- 72 --    Minutes 30 20 30 30     METs 1.9 2.2 2.3 2.5      Track   Laps -- 3 -- --    Minutes -- 10 -- --    METs -- 1.31 -- --      Oxygen   Maintain Oxygen Saturation 88% or higher 88% or higher 88% or higher 88% or higher             Exercise Comments:   Exercise Comments     Row Name 02/11/23 1214           Exercise Comments Pt completed first day of group exercise. He exercised on the recumbent stepper for 30 min at level 3, METs 2.2. He tolerated well. He uses his wheelchair for transport and remained seated during warm up and cool down today. Did leg extensions instead of squats. Will progress to standing as tolerated. Discussed METs with good reception.                Exercise Goals and Review:   Exercise Goals     Row Name 02/05/23 1336              Exercise Goals   Increase Physical Activity Yes       Intervention Provide advice, education, support and counseling about physical activity/exercise needs.;Develop an individualized exercise prescription for aerobic and resistive training based on initial evaluation findings, risk stratification, comorbidities and participant's personal goals.       Expected Outcomes Short Term: Attend rehab on a regular basis to increase amount of physical activity.;Long Term: Add in home exercise to make exercise part of routine and to increase amount of physical activity.;Long Term: Exercising regularly at least 3-5 days a week.       Increase Strength and Stamina Yes       Intervention Provide  advice, education, support and counseling about physical activity/exercise needs.;Develop an individualized exercise prescription for aerobic and resistive training based on initial evaluation findings, risk stratification, comorbidities and participant's personal goals.       Expected Outcomes Short Term: Increase workloads from initial exercise prescription for resistance, speed, and METs.;Short Term: Perform resistance training exercises routinely during rehab and add in resistance training at home;Long Term: Improve cardiorespiratory fitness, muscular endurance and strength as measured by increased METs and functional capacity ( )       Able to understand and use rate of perceived exertion (RPE) scale Yes       Intervention Provide education and explanation on how to use RPE scale       Expected Outcomes Short Term: Able to use RPE daily in rehab to express subjective intensity level;Long Term:  Able to use RPE to guide intensity level when exercising independently       Able to understand and use Dyspnea scale Yes       Intervention Provide education and explanation on how to use Dyspnea scale       Expected Outcomes Short Term: Able to use Dyspnea scale daily in rehab to express subjective sense of shortness of breath  during exertion;Long Term: Able to use Dyspnea scale to guide intensity level when exercising independently       Knowledge and understanding of Target Heart Rate Range (THRR) Yes       Intervention Provide education and explanation of THRR including how the numbers were predicted and where they are located for reference       Expected Outcomes Short Term: Able to state/look up THRR;Short Term: Able to use daily as guideline for intensity in rehab;Long Term: Able to use THRR to govern intensity when exercising independently       Understanding of Exercise Prescription Yes       Intervention Provide education, explanation, and written materials on patient's individual exercise prescription       Expected Outcomes Short Term: Able to explain program exercise prescription;Long Term: Able to explain home exercise prescription to exercise independently                Exercise Goals Re-Evaluation :  Exercise Goals Re-Evaluation     Row Name 02/05/23 1446 03/05/23 1429 04/02/23 1151         Exercise Goal Re-Evaluation   Exercise Goals Review Increase Physical Activity;Able to understand and use Dyspnea scale;Understanding of Exercise Prescription;Increase Strength and Stamina;Knowledge and understanding of Target Heart Rate Range (THRR);Able to understand and use rate of perceived exertion (RPE) scale Increase Physical Activity;Able to understand and use Dyspnea scale;Understanding of Exercise Prescription;Increase Strength and Stamina;Knowledge and understanding of Target Heart Rate Range (THRR);Able to understand and use rate of perceived exertion (RPE) scale Increase Physical Activity;Able to understand and use Dyspnea scale;Understanding of Exercise Prescription;Increase Strength and Stamina;Knowledge and understanding of Target Heart Rate Range (THRR);Able to understand and use rate of perceived exertion (RPE) scale     Comments Zakari is scheduled to start exercise on 12/26. Will monitor and  progress as able. Harrold Donath has completed 7 exercise sessions. He exercises for 20 min on the Nustep and 10 min on the track. Nathan averages 2.2 METs at level 6 on the Nustep and 1.26 METs on the track. Harrold Donath performs the warmup and cooldown standing/ seated dependent on his shortness of breath and pain. He tries to do as much as possible standing. Harrold Donath has progressed to walking the track for 10  min. He tolerates track walking fair and takes rest breaks as needed. Harrold Donath has increased his level on the Nustep as METs have increased. Nathan tolerates the increased level well. Harrold Donath is very motivated to exercise and improve his functional capacity. Harrold Donath has completed 14 exercise sessions. He exercises for 30 min on the Nustep. He averages 2.5 METs at level 9 on the Nustep. Harrold Donath performs the warmup and cooldown mostly seated due to his chronic knee pain. He is very limited to his chronic knee pain. He has been unable to walk the track, but he has increased his level significantly on the Nustep as METs have increased. Harrold Donath remains motivated to exercise and improve his functional capacity despite his limitations. Will continue to monitor and progress as able.     Expected Outcomes Through exercise at rehab and home, the patient will decrease shortness of breath with daily activites and feel confident in carrying out an exercise regimen at home Through exercise at rehab and home, the patient will decrease shortness of breath with daily activites and feel confident in carrying out an exercise regimen at home Through exercise at rehab and home, the patient will decrease shortness of breath with daily activites and feel confident in carrying out an exercise regimen at home              Discharge Exercise Prescription (Final Exercise Prescription Changes):  Exercise Prescription Changes - 04/06/23 1200       Response to Exercise   Blood Pressure (Admit) 102/58    Blood Pressure (Exit) 118/62    Heart  Rate (Admit) 84 bpm    Heart Rate (Exercise) 99 bpm    Heart Rate (Exit) 82 bpm    Oxygen Saturation (Admit) 99 %    Oxygen Saturation (Exercise) 90 %    Oxygen Saturation (Exit) 100 %    Rating of Perceived Exertion (Exercise) 13    Perceived Dyspnea (Exercise) 3    Duration Continue with 30 min of aerobic exercise without signs/symptoms of physical distress.    Intensity THRR unchanged      Progression   Progression Continue to progress workloads to maintain intensity without signs/symptoms of physical distress.      Resistance Training   Training Prescription Yes    Weight blue bands    Reps 10-15    Time 10 Minutes      Oxygen   Oxygen Continuous    Liters 4      NuStep   Level 9    Minutes 30    METs 2.5      Oxygen   Maintain Oxygen Saturation 88% or higher             Nutrition:  Target Goals: Understanding of nutrition guidelines, daily intake of sodium 1500mg , cholesterol 200mg , calories 30% from fat and 7% or less from saturated fats, daily to have 5 or more servings of fruits and vegetables.  Biometrics:  Pre Biometrics - 02/05/23 1348       Pre Biometrics   Grip Strength 40 kg              Nutrition Therapy Plan and Nutrition Goals:   Nutrition Assessments:  MEDIFICTS Score Key: >=70 Need to make dietary changes  40-70 Heart Healthy Diet <= 40 Therapeutic Level Cholesterol Diet   Picture Your Plate Scores: <10 Unhealthy dietary pattern with much room for improvement. 41-50 Dietary pattern unlikely to meet recommendations for good health and room for improvement. 51-60 More healthful  dietary pattern, with some room for improvement.  >60 Healthy dietary pattern, although there may be some specific behaviors that could be improved.    Nutrition Goals Re-Evaluation:   Nutrition Goals Discharge (Final Nutrition Goals Re-Evaluation):   Psychosocial: Target Goals: Acknowledge presence or absence of significant depression and/or  stress, maximize coping skills, provide positive support system. Participant is able to verbalize types and ability to use techniques and skills needed for reducing stress and depression.  Initial Review & Psychosocial Screening:  Initial Psych Review & Screening - 02/05/23 1340       Initial Review   Current issues with None Identified      Family Dynamics   Good Support System? Yes      Barriers   Psychosocial barriers to participate in program There are no identifiable barriers or psychosocial needs.      Screening Interventions   Interventions Encouraged to exercise    Expected Outcomes Long Term Goal: Stressors or current issues are controlled or eliminated.;Short Term goal: Identification and review with participant of any Quality of Life or Depression concerns found by scoring the questionnaire.;Long Term goal: The participant improves quality of Life and PHQ9 Scores as seen by post scores and/or verbalization of changes             Quality of Life Scores:  Scores of 19 and below usually indicate a poorer quality of life in these areas.  A difference of  2-3 points is a clinically meaningful difference.  A difference of 2-3 points in the total score of the Quality of Life Index has been associated with significant improvement in overall quality of life, self-image, physical symptoms, and general health in studies assessing change in quality of life.  PHQ-9: Review Flowsheet  More data exists      02/05/2023 11/13/2022 11/18/2021 10/30/2021 05/19/2021  Depression screen PHQ 2/9  Decreased Interest 0 0 0 0 0  Down, Depressed, Hopeless 1 0 0 0 0  PHQ - 2 Score 1 0 0 0 0  Altered sleeping 0 - 0 - -  Tired, decreased energy 1 - 0 - -  Change in appetite 0 - 0 - -  Feeling bad or failure about yourself  0 - 0 - -  Trouble concentrating 0 - 0 - -  Moving slowly or fidgety/restless 0 - 0 - -  Suicidal thoughts 0 - 0 - -  PHQ-9 Score 2 - 0 - -  Difficult doing work/chores  Somewhat difficult - Not difficult at all - -   Interpretation of Total Score  Total Score Depression Severity:  1-4 = Minimal depression, 5-9 = Mild depression, 10-14 = Moderate depression, 15-19 = Moderately severe depression, 20-27 = Severe depression   Psychosocial Evaluation and Intervention:  Psychosocial Evaluation - 02/05/23 1341       Psychosocial Evaluation & Interventions   Interventions Encouraged to exercise with the program and follow exercise prescription    Comments Deren denies any psychosocial barriers or concerns    Expected Outcomes For Martavious to participate in PR free of barriers    Continue Psychosocial Services  No Follow up required             Psychosocial Re-Evaluation:  Psychosocial Re-Evaluation     Row Name 02/08/23 0900 03/05/23 1054 03/31/23 1049         Psychosocial Re-Evaluation   Current issues with None Identified None Identified None Identified     Comments Mikail is scheduled to start the  PR program on 12/26. No new barriers or concerns since orientation on 12/20. Raul continues to deny any psychosocial barriers or concerns at this time. Tahjai continues to deny any psychosocial barriers or concerns at this time.     Expected Outcomes For Zubair to participate in PR free of any psychosocial barriers or concerns. For Demarcus to participate in PR free of any psychosocial barriers or concerns. For Kemper to participate in PR free of any psychosocial barriers or concerns.     Interventions Encouraged to attend Pulmonary Rehabilitation for the exercise Encouraged to attend Pulmonary Rehabilitation for the exercise Encouraged to attend Pulmonary Rehabilitation for the exercise     Continue Psychosocial Services  No Follow up required No Follow up required No Follow up required              Psychosocial Discharge (Final Psychosocial Re-Evaluation):  Psychosocial Re-Evaluation - 03/31/23 1049       Psychosocial  Re-Evaluation   Current issues with None Identified    Comments Jequan continues to deny any psychosocial barriers or concerns at this time.    Expected Outcomes For Bassel to participate in PR free of any psychosocial barriers or concerns.    Interventions Encouraged to attend Pulmonary Rehabilitation for the exercise    Continue Psychosocial Services  No Follow up required             Education: Education Goals: Education classes will be provided on a weekly basis, covering required topics. Participant will state understanding/return demonstration of topics presented.  Learning Barriers/Preferences:  Learning Barriers/Preferences - 02/05/23 1341       Learning Barriers/Preferences   Learning Barriers None    Learning Preferences Pictoral;Group Instruction             Education Topics: Know Your Numbers Group instruction that is supported by a PowerPoint presentation. Instructor discusses importance of knowing and understanding resting, exercise, and post-exercise oxygen saturation, heart rate, and blood pressure. Oxygen saturation, heart rate, blood pressure, rating of perceived exertion, and dyspnea are reviewed along with a normal range for these values.  Flowsheet Row PULMONARY REHAB OTHER RESPIRATORY from 02/18/2023 in [facility] for Heart, Vascular, & Lung Health  Date 02/18/23  Educator EP  Instruction Review Code 1- Verbalizes Understanding       Exercise for the Pulmonary Patient Group instruction that is supported by a PowerPoint presentation. Instructor discusses benefits of exercise, core components of exercise, frequency, duration, and intensity of an exercise routine, importance of utilizing pulse oximetry during exercise, safety while exercising, and options of places to exercise outside of rehab.  Flowsheet Row PULMONARY REHAB OTHER RESPIRATORY from 02/11/2023 in [facility] for Heart, Vascular, & Lung  Health  Date 02/11/23  Educator EP  Instruction Review Code 1- Verbalizes Understanding       MET Level  Group instruction provided by PowerPoint, verbal discussion, and written material to support subject matter. Instructor reviews what METs are and how to increase METs.    Pulmonary Medications Verbally interactive group education provided by instructor with focus on inhaled medications and proper administration.   Anatomy and Physiology of the Respiratory System Group instruction provided by PowerPoint, verbal discussion, and written material to support subject matter. Instructor reviews respiratory cycle and anatomical components of the respiratory system and their functions. Instructor also reviews differences in obstructive and restrictive respiratory diseases with examples of each.    Oxygen Safety Group instruction provided by PowerPoint, verbal discussion, and  written material to support subject matter. There is an overview of "What is Oxygen" and "Why do we need it".  Instructor also reviews how to create a safe environment for oxygen use, the importance of using oxygen as prescribed, and the risks of noncompliance. There is a brief discussion on traveling with oxygen and resources the patient may utilize. Flowsheet Row PULMONARY REHAB OTHER RESPIRATORY from 02/25/2023 in [facility] for Heart, Vascular, & Lung Health  Date 02/25/23  Educator RN  Instruction Review Code 1- Verbalizes Understanding       Oxygen Use Group instruction provided by PowerPoint, verbal discussion, and written material to discuss how supplemental oxygen is prescribed and different types of oxygen supply systems. Resources for more information are provided.  Flowsheet Row PULMONARY REHAB OTHER RESPIRATORY from 03/04/2023 in [facility] for Heart, Vascular, & Lung Health  Date 03/04/23  Educator RT  Instruction Review Code 1- Verbalizes Understanding        Breathing Techniques Group instruction that is supported by demonstration and informational handouts. Instructor discusses the benefits of pursed lip and diaphragmatic breathing and detailed demonstration on how to perform both.  Flowsheet Row PULMONARY REHAB OTHER RESPIRATORY from 03/11/2023 in [facility] for Heart, Vascular, & Lung Health  Date 03/11/23  Educator RN  Instruction Review Code 1- Verbalizes Understanding        Risk Factor Reduction Group instruction that is supported by a PowerPoint presentation. Instructor discusses the definition of a risk factor, different risk factors for pulmonary disease, and how the heart and lungs work together.   Pulmonary Diseases Group instruction provided by PowerPoint, verbal discussion, and written material to support subject matter. Instructor gives an overview of the different type of pulmonary diseases. There is also a discussion on risk factors and symptoms as well as ways to manage the diseases.   Stress and Energy Conservation Group instruction provided by PowerPoint, verbal discussion, and written material to support subject matter. Instructor gives an overview of stress and the impact it can have on the body. Instructor also reviews ways to reduce stress. There is also a discussion on energy conservation and ways to conserve energy throughout the day. Flowsheet Row PULMONARY REHAB OTHER RESPIRATORY from 03/18/2023 in [facility] for Heart, Vascular, & Lung Health  Date 03/18/23  Educator RN  Instruction Review Code 1- Verbalizes Understanding       Warning Signs and Symptoms Group instruction provided by PowerPoint, verbal discussion, and written material to support subject matter. Instructor reviews warning signs and symptoms of stroke, heart attack, cold and flu. Instructor also reviews ways to prevent the spread of infection. Flowsheet Row PULMONARY REHAB OTHER  RESPIRATORY from 03/25/2023 in [facility] for Heart, Vascular, & Lung Health  Date 03/25/23  Educator RN  Instruction Review Code 1- Verbalizes Understanding       Other Education Group or individual verbal, written, or video instructions that support the educational goals of the pulmonary rehab program.    Knowledge Questionnaire Score:  Knowledge Questionnaire Score - 02/05/23 1453       Knowledge Questionnaire Score   Pre Score 11/18             Core Components/Risk Factors/Patient Goals at Admission:  Personal Goals and Risk Factors at Admission - 02/05/23 1342       Core Components/Risk Factors/Patient Goals on Admission   Improve shortness of breath with ADL's Yes  Intervention Provide education, individualized exercise plan and daily activity instruction to help decrease symptoms of SOB with activities of daily living.    Expected Outcomes Short Term: Improve cardiorespiratory fitness to achieve a reduction of symptoms when performing ADLs;Long Term: Be able to perform more ADLs without symptoms or delay the onset of symptoms    Heart Failure Yes    Intervention Provide a combined exercise and nutrition program that is supplemented with education, support and counseling about heart failure. Directed toward relieving symptoms such as shortness of breath, decreased exercise tolerance, and extremity edema.    Expected Outcomes Improve functional capacity of life;Short term: Attendance in program 2-3 days a week with increased exercise capacity. Reported lower sodium intake. Reported increased fruit and vegetable intake. Reports medication compliance.;Short term: Daily weights obtained and reported for increase. Utilizing diuretic protocols set by physician.;Long term: Adoption of self-care skills and reduction of barriers for early signs and symptoms recognition and intervention leading to self-care maintenance.             Core Components/Risk  Factors/Patient Goals Review:   Goals and Risk Factor Review     Row Name 02/08/23 0902 03/05/23 1100 03/31/23 1049         Core Components/Risk Factors/Patient Goals Review   Personal Goals Review Improve shortness of breath with ADL's;Develop more efficient breathing techniques such as purse lipped breathing and diaphragmatic breathing and practicing self-pacing with activity.;Heart Failure Improve shortness of breath with ADL's;Develop more efficient breathing techniques such as purse lipped breathing and diaphragmatic breathing and practicing self-pacing with activity.;Heart Failure Improve shortness of breath with ADL's;Develop more efficient breathing techniques such as purse lipped breathing and diaphragmatic breathing and practicing self-pacing with activity.;Heart Failure     Review Talbot is scheduled to start the PR program on 12/26. We will continue to monitor his progress toward his goals throughout the program. Goal progressing on improving his shortness of breath with ADLs. Goal progressing on developing more efficient breathing techniques such as purse lipped breathing and diaphragmatic breathing; and practicing self-pacing with activity. Goal progressing for heart failure. Harrison is currently requiring 3-4L of O2 to keep sats >88% while exercising. He has attended 7 sessions so far and can already tell a difference in how he feels. We will continue to monitor his progress throughout the program. Goal progressing on improving his shortness of breath with ADLs. Goal met on developing more efficient breathing techniques such as purse lipped breathing and diaphragmatic breathing; and practicing self-pacing with activity. Tyriek is able to demonstrate purse lip breathing when he gets short of breath. He also knows how to pace himself when walking the track. Goal progressing on heart failure. Murat is currently requiring 3-4L of O2 to keep sats >88% while exercising. He is currently  exercising on the [place].  We will continue to monitor his progress throughout the program.     Expected Outcomes See admission goal To improve shortness of breath with ADL's, develop more efficient breathing techniques such as purse lipped breathing and diaphragmatic breathing; and practicing self-pacing with activity and have controlled heart failure. To improve shortness of breath with ADL's, develop more efficient breathing techniques such as purse lipped breathing and diaphragmatic breathing; and practicing self-pacing with activity and have controlled heart failure.              Core Components/Risk Factors/Patient Goals at Discharge (Final Review):   Goals and Risk Factor Review - 03/31/23 1049       Core Components/Risk  Factors/Patient Goals Review   Personal Goals Review Improve shortness of breath with ADL's;Develop more efficient breathing techniques such as purse lipped breathing and diaphragmatic breathing and practicing self-pacing with activity.;Heart Failure    Review Goal progressing on improving his shortness of breath with ADLs. Goal met on developing more efficient breathing techniques such as purse lipped breathing and diaphragmatic breathing; and practicing self-pacing with activity. Smaran is able to demonstrate purse lip breathing when he gets short of breath. He also knows how to pace himself when walking the track. Goal progressing on heart failure. Kashaun is currently requiring 3-4L of O2 to keep sats >88% while exercising. He is currently exercising on the [place].  We will continue to monitor his progress throughout the program.    Expected Outcomes To improve shortness of breath with ADL's, develop more efficient breathing techniques such as purse lipped breathing and diaphragmatic breathing; and practicing self-pacing with activity and have controlled heart failure.             ITP Comments: Pt is making expected progress toward Pulmonary Rehab goals after  completing 14 session(s). Recommend continued exercise, life style modification, education, and utilization of breathing techniques to increase stamina and strength, while also decreasing shortness of breath with exertion.  Dr. Mechele Collin is Medical Director for Pulmonary Rehab at [facility].

## 2023-04-08 ENCOUNTER — Encounter (HOSPITAL_COMMUNITY): Payer: Medicare HMO

## 2023-04-09 ENCOUNTER — Other Ambulatory Visit (HOSPITAL_COMMUNITY): Payer: Self-pay

## 2023-04-09 ENCOUNTER — Telehealth: Payer: Self-pay | Admitting: Internal Medicine

## 2023-04-09 MED ORDER — MOUNJARO 15 MG/0.5ML ~~LOC~~ SOAJ
15.0000 mg | SUBCUTANEOUS | 2 refills | Status: DC
  Filled 2023-04-09: qty 2, 28d supply, fill #0
  Filled 2023-05-12: qty 2, 28d supply, fill #1
  Filled 2023-06-07: qty 2, 28d supply, fill #2

## 2023-04-09 NOTE — Telephone Encounter (Signed)
Rx sent to pharmacy -- pt notified.  

## 2023-04-09 NOTE — Telephone Encounter (Signed)
Pt c/o medication issue:  1. Name of Medication:   tirzepatide (MOUNJARO) 7.5 MG/0.5ML Pen    2. How are you currently taking this medication (dosage and times per day)?    3. Are you having a reaction (difficulty breathing--STAT)? no  4. What is your medication issue? Patient wants to go back to the 15mg  dosage of medication. States his last dosage is on Monday.  Please advise

## 2023-04-13 ENCOUNTER — Encounter (HOSPITAL_COMMUNITY)
Admission: RE | Admit: 2023-04-13 | Discharge: 2023-04-13 | Disposition: A | Discharge status: Home / Self Care | Payer: Medicare HMO | Source: Ambulatory Visit | Attending: Pulmonary Disease

## 2023-04-13 DIAGNOSIS — I5042 Chronic combined systolic (congestive) and diastolic (congestive) heart failure: Secondary | ICD-10-CM

## 2023-04-13 NOTE — Progress Notes (Signed)
 Daily Session Note  Patient Details  Name: Thomas [name] MRN: [id number] Date of Birth: April 01, 1950 Referring Provider:   Doristine Devoid Pulmonary Rehab Walk Test from 02/05/2023 in [facility] for Heart, Vascular, & Lung Health  Referring Provider Sood       Encounter Date: 04/13/2023  Check In:  Session Check In - 04/13/23 1206       Check-In   Supervising physician immediately available to respond to emergencies [facility] MD immediately available    Physician(s) Robin Searing, NP    Location [facility]    Staff Present Essie Hart, RN, BSN;Casey Katrinka Blazing, Zella Richer, MS, ACSM-CEP, Exercise Physiologist;Johnny Hale Bogus, MS, Exercise Physiologist    Virtual Visit No    Medication changes reported     No    Fall or balance concerns reported    No    Tobacco Cessation No Change    Warm-up and Cool-down Performed as group-led instruction    Resistance Training Performed Yes    VAD Patient? No    PAD/SET Patient? No      Pain Assessment   Currently in Pain? No/denies    Multiple Pain Sites No             Capillary Blood Glucose: No results found for this or any previous visit (from the past 24 hours).    Social History   Tobacco Use  Smoking Status Never   Passive exposure: Never  Smokeless Tobacco Never    Goals Met:  Proper associated with RPD/PD & O2 Sat Independence with exercise equipment Exercise tolerated well No report of concerns or symptoms today Strength training completed today  Goals Unmet:  Not Applicable  Comments: Service time is from 1011 to 1142.    Dr. Mechele Collin is Medical Director for Pulmonary Rehab at [facility].

## 2023-04-14 DIAGNOSIS — R339 Retention of urine, unspecified: Secondary | ICD-10-CM | POA: Diagnosis not present

## 2023-04-15 ENCOUNTER — Encounter (HOSPITAL_COMMUNITY)
Admission: RE | Admit: 2023-04-15 | Discharge: 2023-04-15 | Disposition: A | Discharge status: Home / Self Care | Payer: Medicare HMO | Source: Ambulatory Visit | Attending: Pulmonary Disease | Admitting: Pulmonary Disease

## 2023-04-15 DIAGNOSIS — I5042 Chronic combined systolic (congestive) and diastolic (congestive) heart failure: Secondary | ICD-10-CM

## 2023-04-15 NOTE — Progress Notes (Signed)
 Daily Session Note  Patient Details  Name: FABION GATSON MRN: [id number] Date of Birth: Dec 06, 1950 Referring Provider:   Doristine Devoid Pulmonary Rehab Walk Test from 02/05/2023 in [facility] for Heart, Vascular, & Lung Health  Referring Provider Sood       Encounter Date: 04/15/2023  Check In:  Session Check In - 04/15/23 1033       Check-In   Supervising physician immediately available to respond to emergencies [facility] MD immediately available    Physician(s) Bernadene Person, NP    Location [facility]    Staff Present Essie Hart, RN, BSN;Kaydense Rizo, Zella Richer, MS, ACSM-CEP, Exercise Physiologist;Johnny Hale Bogus, MS, Exercise Physiologist;Randi Idelle Crouch BS, ACSM-CEP, Exercise Physiologist;Samantha Belarus, RD, LDN    Virtual Visit No    Medication changes reported     No    Fall or balance concerns reported    No    Tobacco Cessation No Change    Warm-up and Cool-down Performed as group-led instruction    Resistance Training Performed Yes    VAD Patient? No    PAD/SET Patient? No      Pain Assessment   Currently in Pain? No/denies             Capillary Blood Glucose: No results found for this or any previous visit (from the past 24 hours).    Social History   Tobacco Use  Smoking Status Never   Passive exposure: Never  Smokeless Tobacco Never    Goals Met:  Proper associated with RPD/PD & O2 Sat Independence with exercise equipment Exercise tolerated well No report of concerns or symptoms today Strength training completed today  Goals Unmet:  Not Applicable  Comments: Service time is from 1010 to 1143.    Dr. Mechele Collin is Medical Director for Pulmonary Rehab at [facility].

## 2023-04-20 ENCOUNTER — Encounter (HOSPITAL_COMMUNITY)
Admission: RE | Admit: 2023-04-20 | Discharge: 2023-04-20 | Disposition: A | Discharge status: Home / Self Care | Payer: Medicare HMO | Source: Ambulatory Visit | Attending: Pulmonary Disease | Admitting: Pulmonary Disease

## 2023-04-20 VITALS — Wt 302.9 lb

## 2023-04-20 DIAGNOSIS — I5042 Chronic combined systolic (congestive) and diastolic (congestive) heart failure: Secondary | ICD-10-CM | POA: Insufficient documentation

## 2023-04-20 NOTE — Progress Notes (Signed)
 Daily Session Note  Patient Details  Name: Thomas [name] MRN: [id number] Date of Birth: 08-26-1950 Referring Provider:   Doristine Devoid Pulmonary Rehab Walk Test from 02/05/2023 in [facility] for Heart, Vascular, & Lung Health  Referring Provider Sood       Encounter Date: 04/20/2023  Check In:  Session Check In - 04/20/23 1024       Check-In   Supervising physician immediately available to respond to emergencies [facility] MD immediately available    Physician(s) Bernadene Person, NP    Location [facility]    Staff Present Essie Hart, RN, BSN;Hollin Crewe, Zella Richer, MS, ACSM-CEP, Exercise Physiologist;Randi Idelle Crouch BS, ACSM-CEP, Exercise Physiologist;Samantha Belarus, RD, LDN    Virtual Visit No    Medication changes reported     No    Fall or balance concerns reported    No    Tobacco Cessation No Change    Warm-up and Cool-down Performed as group-led instruction    Resistance Training Performed Yes    VAD Patient? No    PAD/SET Patient? No      Pain Assessment   Currently in Pain? No/denies    Multiple Pain Sites No             Capillary Blood Glucose: No results found for this or any previous visit (from the past 24 hours).   Exercise Prescription Changes - 04/20/23 1100       Response to Exercise   Blood Pressure (Admit) 100/58    Blood Pressure (Exercise) 128/76    Blood Pressure (Exit) 106/60    Heart Rate (Admit) 75 bpm    Heart Rate (Exercise) 95 bpm    Heart Rate (Exit) 86 bpm    Oxygen Saturation (Admit) 96 %    Oxygen Saturation (Exercise) 91 %    Oxygen Saturation (Exit) 94 %    Rating of Perceived Exertion (Exercise) 13    Perceived Dyspnea (Exercise) 2    Duration Continue with 30 min of aerobic exercise without signs/symptoms of physical distress.    Intensity THRR unchanged      Progression   Progression Continue to progress workloads to maintain intensity without signs/symptoms of physical  distress.      Resistance Training   Training Prescription Yes    Weight blue bands    Reps 10-15    Time 10 Minutes      Oxygen   Oxygen Continuous    Liters 4      NuStep   Level 10    SPM 61    Minutes 30    METs 3.1      Oxygen   Maintain Oxygen Saturation 88% or higher             Social History   Tobacco Use  Smoking Status Never   Passive exposure: Never  Smokeless Tobacco Never    Goals Met:  Proper associated with RPD/PD & O2 Sat Independence with exercise equipment Exercise tolerated well No report of concerns or symptoms today Strength training completed today  Goals Unmet:  Not Applicable  Comments: Service time is from 1009 to 1129.    Dr. Mechele Collin is Medical Director for Pulmonary Rehab at [facility]

## 2023-04-22 ENCOUNTER — Encounter (HOSPITAL_COMMUNITY)
Admission: RE | Admit: 2023-04-22 | Discharge: 2023-04-22 | Disposition: A | Discharge status: Home / Self Care | Payer: Medicare HMO | Source: Ambulatory Visit | Attending: Pulmonary Disease | Admitting: Pulmonary Disease

## 2023-04-22 DIAGNOSIS — I5042 Chronic combined systolic (congestive) and diastolic (congestive) heart failure: Secondary | ICD-10-CM | POA: Diagnosis not present

## 2023-04-22 NOTE — Progress Notes (Signed)
 Daily Session Note  Patient Details  Name: PRISCILLA FINKLEA MRN: [id number] Date of Birth: [birth year] Referring Provider:   Doristine Devoid Pulmonary Rehab Walk Test from 02/05/2023 in [facility]  Referring Provider Sood       Encounter Date: 04/22/2023  Check In:  Session Check In - 04/22/23 1025       Check-In   Supervising physician immediately available to respond to emergencies [facility] MD immediately available    Physician(s) Robin Searing, NP    Location [facility]    Staff Present Essie Hart, RN, BSN;Lydia Meng Katrinka Blazing, Zella Richer, MS, ACSM-CEP, Exercise Physiologist;Randi [facility], ACSM-CEP, Exercise Physiologist    Virtual Visit No    Medication changes reported     No    Fall or balance concerns reported    No    Tobacco Cessation No Change    Warm-up and Cool-down Performed as group-led instruction    Resistance Training Performed Yes    VAD Patient? No    PAD/SET Patient? No      Pain Assessment   Currently in Pain? No/denies             Capillary Blood Glucose: No results found for this or any previous visit (from the past 24 hours).    Social History   Tobacco Use  Smoking Status Never   Passive exposure: Never  Smokeless Tobacco Never    Goals Met:  Proper associated with RPD/PD & O2 Sat Independence with exercise equipment Exercise tolerated well No report of concerns or symptoms today Strength training completed today  Goals Unmet:  Not Applicable  Comments: Service time is from 1008 to 1127.    Dr. Mechele Collin is Medical Director for Pulmonary Rehab at [facility]

## 2023-04-22 NOTE — Progress Notes (Signed)
 Home Exercise Prescription I have reviewed a Home Exercise Prescription with [name]. Brentin is currently exercising at home. He walks 5-7 days/wk for 8 min. Moris tries to walk laps around his house. I encouraged Romualdo to increase his time to 30 min of walking. I did say that the 30 min could include rest breaks. Patient agreed with my recommendations. Dirck seems motivated to exercise. The patient stated that their goals were to decrease shortness of breath. We reviewed exercise guidelines, target heart rate during exercise, RPE Scale, weather conditions, endpoints for exercise, warmup and cool down. The patient is encouraged to come to me with any questions. I will continue to follow up with the patient to assist them with progression and safety. Spent 15 min with patient discussing home exercise plan and goals  Joya San, MS, ACSM-CEP 04/22/2023 11:52 AM

## 2023-04-27 ENCOUNTER — Encounter (HOSPITAL_COMMUNITY)
Admission: RE | Admit: 2023-04-27 | Discharge: 2023-04-27 | Disposition: A | Discharge status: Home / Self Care | Payer: Medicare HMO | Source: Ambulatory Visit | Attending: Pulmonary Disease

## 2023-04-27 DIAGNOSIS — I5042 Chronic combined systolic (congestive) and diastolic (congestive) heart failure: Secondary | ICD-10-CM | POA: Diagnosis not present

## 2023-04-27 NOTE — Progress Notes (Signed)
 Daily Session Note  Patient Details  Name: Thomas [name] MRN: [id number] Date of Birth: 1950-11-22 Referring Provider:   Doristine Devoid Pulmonary Rehab Walk Test from 02/05/2023 in [facility], [facility]  Referring Provider Sood       Encounter Date: 04/27/2023  Check In:  Session Check In - 04/27/23 1045       Check-In   Supervising physician immediately available to respond to emergencies [facility] MD immediately available    Physician(s) Tereso Newcomer, PA    [facility]    Staff Present Durel Salts, Zella Richer, MS, ACSM-CEP, Exercise Physiologist;Randi Idelle Crouch BS, ACSM-CEP, Exercise Physiologist;Samantha Belarus, RD, LDN    Virtual Visit No    Medication changes reported     No    Fall or balance concerns reported    No    Tobacco Cessation No Change    Warm-up and Cool-down Performed as group-led instruction    Resistance Training Performed Yes    VAD Patient? No    PAD/SET Patient? No      Pain Assessment   Currently in Pain? No/denies             Capillary Blood Glucose: No results found for this or any previous visit (from the past 24 hours).    Social History   Tobacco Use  Smoking Status Never   Passive exposure: Never  Smokeless Tobacco Never    Goals Met:  Proper associated with RPD/PD & O2 Sat Independence with exercise equipment Exercise tolerated well No report of concerns or symptoms today Strength training completed today  Goals Unmet:  Not Applicable  Comments: Service time is from 1006 to 1120.    Dr. Mechele Collin is Medical Director for Pulmonary Rehab at [facility].

## 2023-04-29 ENCOUNTER — Encounter (HOSPITAL_COMMUNITY)
Admission: RE | Admit: 2023-04-29 | Discharge: 2023-04-29 | Disposition: A | Discharge status: Home / Self Care | Payer: Medicare HMO | Source: Ambulatory Visit | Attending: Pulmonary Disease | Admitting: Pulmonary Disease

## 2023-04-29 DIAGNOSIS — I5042 Chronic combined systolic (congestive) and diastolic (congestive) heart failure: Secondary | ICD-10-CM | POA: Diagnosis not present

## 2023-04-29 NOTE — Progress Notes (Signed)
 Daily Session Note  Patient Details  Name: Thomas [name] MRN: [id number] Date of Birth: 04-02-1950 Referring Provider:   Doristine Devoid Pulmonary Rehab Walk Test from 02/05/2023 in [facility] for [facility], Vascular, & Lung [facility]  Referring Provider Sood       Encounter Date: 04/29/2023  Check In:  Session Check In - 04/29/23 1043       Check-In   Supervising physician immediately available to respond to emergencies [facility] MD immediately available    Physician(s) Edd Fabian, NP    Location [facility]    Staff Present Durel Salts, Zella Richer, MS, ACSM-CEP, Exercise Physiologist;Anquan Azzarello Dionisio Paschal, ACSM-CEP, Exercise Physiologist;Samantha Belarus, RD, LDN;Johnny Hale Bogus, MS, Exercise Physiologist    Virtual Visit No    Medication changes reported     No    Fall or balance concerns reported    No    Tobacco Cessation No Change    Warm-up and Cool-down Performed as group-led instruction    Resistance Training Performed Yes    VAD Patient? No    PAD/SET Patient? No      Pain Assessment   Currently in Pain? No/denies             Capillary Blood Glucose: No results found for this or any previous visit (from the past 24 hours).    Social History   Tobacco Use  Smoking Status Never   Passive exposure: Never  Smokeless Tobacco Never    Goals Met:  Independence with exercise equipment Exercise tolerated well No report of concerns or symptoms today Strength training completed today  Goals Unmet:  Not Applicable  Comments: Service time is from 1005 to 1127.    Dr. Mechele Collin is Medical Director for Pulmonary Rehab at [facility].

## 2023-05-03 ENCOUNTER — Encounter (HOSPITAL_COMMUNITY): Payer: Self-pay

## 2023-05-03 ENCOUNTER — Other Ambulatory Visit: Payer: Self-pay | Admitting: Nurse Practitioner

## 2023-05-03 ENCOUNTER — Other Ambulatory Visit (HOSPITAL_COMMUNITY): Payer: Self-pay

## 2023-05-03 DIAGNOSIS — M170 Bilateral primary osteoarthritis of knee: Secondary | ICD-10-CM

## 2023-05-03 NOTE — Telephone Encounter (Signed)
 Patient is requesting a refill of the following medications: Requested Prescriptions   Pending Prescriptions Disp Refills   traMADol (ULTRAM) 50 MG tablet 30 tablet 0    Sig: Take 1 tablet (50 mg total) by mouth every 6 (six) hours as needed for pain    Date of last refill: 03/02/23  Refill amount: 30/0  Treatment agreement date:  06/29/2022

## 2023-05-03 NOTE — Telephone Encounter (Signed)
 Needs to schedule follow up prior to refill being sent it, overdue

## 2023-05-04 ENCOUNTER — Encounter (HOSPITAL_COMMUNITY)
Admission: RE | Admit: 2023-05-04 | Discharge: 2023-05-04 | Disposition: A | Discharge status: Home / Self Care | Payer: Medicare HMO | Source: Ambulatory Visit | Attending: Pulmonary Disease | Admitting: Pulmonary Disease

## 2023-05-04 DIAGNOSIS — I5042 Chronic combined systolic (congestive) and diastolic (congestive) heart failure: Secondary | ICD-10-CM

## 2023-05-04 NOTE — Progress Notes (Signed)
 Daily Session Note  Patient Details  Name: Thomas [name] MRN: [id number] Date of Birth: 1950/12/11 Referring Provider:   Doristine Devoid Pulmonary Rehab Walk Test from 02/05/2023 in [facility]  Referring Provider Sood       Encounter Date: 05/04/2023  Check In:  Session Check In - 05/04/23 1109       Check-In   Supervising physician immediately available to respond to emergencies [facility] MD immediately available    Physician(s) Edd Fabian, NP    [facility]    Staff Present Durel Salts, Zella Richer, MS, ACSM-CEP, Exercise Physiologist;Randi Dionisio Paschal, ACSM-CEP, Exercise Physiologist;Samantha Belarus, RD, Dutch Gray, RN, BSN    Virtual Visit No    Medication changes reported     No    Fall or balance concerns reported    No    Tobacco Cessation No Change    Warm-up and Cool-down Performed as group-led Writer Performed Yes    VAD Patient? No      Pain Assessment   Currently in Pain? No/denies             Capillary Blood Glucose: No results found for this or any previous visit (from the past 24 hours).    Social History   Tobacco Use  Smoking Status Never   Passive exposure: Never  Smokeless Tobacco Never    Goals Met:  Proper associated with RPD/PD & O2 Sat Independence with exercise equipment Exercise tolerated well No report of concerns or symptoms today Strength training completed today  Goals Unmet:  Not Applicable  Comments: Service time is from 1010 to 1135.    Dr. Mechele Collin is Medical Director for Pulmonary Rehab at [facility].

## 2023-05-04 NOTE — Progress Notes (Signed)
 Discharge Progress Report  Patient Details  Name: Thomas [name] MRN: [id number] Date of Birth: 19-Jun-1950 Referring Provider:   Doristine Devoid Pulmonary Rehab Walk Test from 02/05/2023 in [facility]  Referring Provider Sood        Number of Visits: 21  Reason for Discharge:  Patient has met program and personal goals.  Smoking History:  Social History   Tobacco Use  Smoking Status Never   Passive exposure: Never  Smokeless Tobacco Never    Diagnosis:  Chronic combined systolic (congestive) and diastolic (congestive) heart failure (HCC)  ADL [facility]:  Pulmonary Assessment Scores     Row Name 02/05/23 1447 04/27/23 1611       ADL [facility]   ADL Phase Entry Exit    SOB Score total 66 76      CAT Score   CAT Score 22 18      mMRC Score   mMRC Score 4 4             Initial Exercise Prescription:  Initial Exercise Prescription - 02/05/23 1400       Date of Initial Exercise RX and Referring Provider   Date 02/05/23    Referring Provider Sood    Expected Discharge Date 05/04/23      NuStep   Level 1    SPM 92    Minutes 30    METs 1.9      Prescription Details   Frequency (times per week) 2    Duration Progress to 30 minutes of continuous aerobic without signs/symptoms of physical distress      Intensity   THRR 40-80% of Max Heartrate 59-118    Ratings of Perceived Exertion 11-13    Perceived Dyspnea 0-4      Progression   Progression Continue to progress workloads to maintain intensity without signs/symptoms of physical distress.      Resistance Training   Training Prescription Yes    Weight blue bands    Reps 10-15             Discharge Exercise Prescription (Final Exercise Prescription Changes):  Exercise Prescription Changes - 04/20/23 1100       Response to Exercise   Blood Pressure (Admit) 100/58    Blood Pressure (Exercise) 128/76    Blood Pressure (Exit) 106/60     Heart Rate (Admit) 75 bpm    Heart Rate (Exercise) 95 bpm    Heart Rate (Exit) 86 bpm    Oxygen Saturation (Admit) 96 %    Oxygen Saturation (Exercise) 91 %    Oxygen Saturation (Exit) 94 %    Rating of Perceived Exertion (Exercise) 13    Perceived Dyspnea (Exercise) 2    Duration Continue with 30 min of aerobic exercise without signs/symptoms of physical distress.    Intensity THRR unchanged      Progression   Progression Continue to progress workloads to maintain intensity without signs/symptoms of physical distress.      Resistance Training   Training Prescription Yes    Weight blue bands    Reps 10-15    Time 10 Minutes      Oxygen   Oxygen Continuous    Liters 4      NuStep   Level 10    SPM 61    Minutes 30    METs 3.1      Oxygen   Maintain Oxygen Saturation 88% or higher  Functional Capacity:  6 Minute Walk     Row Name 02/05/23 1438 04/29/23 1519       6 Minute Walk   Phase Initial  Nustep test Discharge    Distance 1584 feet 2640 feet    Distance % Change -- 66.67 %    Distance Feet Change -- 1056 ft    Walk Time 6 minutes 6 minutes    # of Rest Breaks 0 0    MPH 3 5    METS 2.61 4.08    RPE 11 11    Perceived Dyspnea  1 1    VO2 Peak 9.13 14.28    Symptoms No No    Resting HR 61 bpm 91 bpm    Resting BP 122/62 110/64    Resting Oxygen Saturation  94 % 93 %    Exercise Oxygen Saturation  during 6 min walk 87 % 86 %    Max Ex. HR 123 bpm 103 bpm    Max Ex. BP 122/64 104/64    2 Minute Post BP 120/60 110/60      Interval HR   1 Minute HR 84 86    2 Minute HR 89 91    3 Minute HR 97 97    4 Minute HR 99 101    5 Minute HR 89 103    6 Minute HR 123 102    2 Minute Post HR 64 83    Interval Heart Rate? Yes Yes      Interval Oxygen   Interval Oxygen? Yes Yes    Baseline Oxygen Saturation % 94 % 93 %    1 Minute Oxygen Saturation % 90 % 93 %    1 Minute Liters of Oxygen 0 L 3 L    2 Minute Oxygen Saturation % 89 % 90 %     2 Minute Liters of Oxygen 0 L 3 L    3 Minute Oxygen Saturation % 90 % 88 %  2:12- 86%    3 Minute Liters of Oxygen 0 L 3 L    4 Minute Oxygen Saturation % 89 % 90 %    4 Minute Liters of Oxygen 0 L 4 L    5 Minute Oxygen Saturation % 88 % 89 %    5 Minute Liters of Oxygen 0 L 4 L    6 Minute Oxygen Saturation % 88 %  87% @ 5:39 88 %    6 Minute Liters of Oxygen 2 L 4 L    2 Minute Post Oxygen Saturation % 96 % 93 %    2 Minute Post Liters of Oxygen 2 L 4 L             Psychological, QOL, Others - Outcomes: PHQ 2/9:    04/27/2023    4:08 PM 02/05/2023    3:02 PM 11/13/2022    9:44 AM 11/18/2021    2:13 PM 10/30/2021   10:44 AM  Depression screen PHQ 2/9  Decreased Interest 2 0 0 0 0  Down, Depressed, Hopeless 1 1 0 0 0  PHQ - 2 Score 3 1 0 0 0  Altered sleeping 0 0  0   Tired, decreased energy 1 1  0   Change in appetite 0 0  0   Feeling bad or failure about yourself  1 0  0   Trouble concentrating 0 0  0   Moving slowly or fidgety/restless 0 0  0   Suicidal  thoughts 0 0  0   PHQ-9 Score 5 2  0   Difficult doing work/chores Not difficult at all Somewhat difficult  Not difficult at all     Quality of Life:   Personal Goals: Goals established at orientation with interventions provided to work toward goal.  Personal Goals and Risk Factors at Admission - 02/05/23 1342       Core Components/Risk Factors/Patient Goals on Admission   Improve shortness of breath with ADL's Yes    Intervention Provide education, individualized exercise plan and daily activity instruction to help decrease symptoms of SOB with activities of daily living.    Expected Outcomes Short Term: Improve cardiorespiratory fitness to achieve a reduction of symptoms when performing ADLs;Long Term: Be able to perform more ADLs without symptoms or delay the onset of symptoms    Heart Failure Yes    Intervention Provide a combined exercise and nutrition program that is supplemented with education, support  and counseling about heart failure. Directed toward relieving symptoms such as shortness of breath, decreased exercise tolerance, and extremity edema.    Expected Outcomes Improve functional capacity of life;Short term: Attendance in program 2-3 days a week with increased exercise capacity. Reported lower sodium intake. Reported increased fruit and vegetable intake. Reports medication compliance.;Short term: Daily weights obtained and reported for increase. Utilizing diuretic protocols set by physician.;Long term: Adoption of self-care skills and reduction of barriers for early signs and symptoms recognition and intervention leading to self-care maintenance.              Personal Goals Discharge:  Goals and Risk Factor Review     Row Name 02/08/23 0902 03/05/23 1100 03/31/23 1049 05/04/23 1515       Core Components/Risk Factors/Patient Goals Review   Personal Goals Review Improve shortness of breath with ADL's;Develop more efficient breathing techniques such as purse lipped breathing and diaphragmatic breathing and practicing self-pacing with activity.;Heart Failure Improve shortness of breath with ADL's;Develop more efficient breathing techniques such as purse lipped breathing and diaphragmatic breathing and practicing self-pacing with activity.;Heart Failure Improve shortness of breath with ADL's;Develop more efficient breathing techniques such as purse lipped breathing and diaphragmatic breathing and practicing self-pacing with activity.;Heart Failure Improve shortness of breath with ADL's    Review Dhillon is scheduled to start the PR program on 12/26. We will continue to monitor his progress toward his goals throughout the program. Goal progressing on improving his shortness of breath with ADLs. Goal progressing on developing more efficient breathing techniques such as purse lipped breathing and diaphragmatic breathing; and practicing self-pacing with activity. Goal progressing for heart  failure. Moua is currently requiring 3-4L of O2 to keep sats >88% while exercising. He has attended 7 sessions so far and can already tell a difference in how he feels. We will continue to monitor his progress throughout the program. Goal progressing on improving his shortness of breath with ADLs. Goal met on developing more efficient breathing techniques such as purse lipped breathing and diaphragmatic breathing; and practicing self-pacing with activity. Riyansh is able to demonstrate purse lip breathing when he gets short of breath. He also knows how to pace himself when walking the track. Goal progressing on heart failure. Rube is currently requiring 3-4L of O2 to keep sats >88% while exercising. He is currently exercising on the Nustep.  We will continue to monitor his progress throughout the program. Felis graduated on 05/04/23. Jamason met his goal for improving shortness of breath with ADL's. Nathaniels SOB scores increased  from 83 to 69, but [place] states his SOB is better.    Expected Outcomes See admission goal To improve shortness of breath with ADL's, develop more efficient breathing techniques such as purse lipped breathing and diaphragmatic breathing; and practicing self-pacing with activity and have controlled heart failure. To improve shortness of breath with ADL's, develop more efficient breathing techniques such as purse lipped breathing and diaphragmatic breathing; and practicing self-pacing with activity and have controlled heart failure. To continue to exercise and modify his nutrition and lifestyle post graduation             Exercise Goals and Review:  Exercise Goals     Row Name 02/05/23 1336             Exercise Goals   Increase Physical Activity Yes       Intervention Provide advice, education, support and counseling about physical activity/exercise needs.;Develop an individualized exercise prescription for aerobic and resistive training based on  initial evaluation findings, risk stratification, comorbidities and participant's personal goals.       Expected Outcomes Short Term: Attend rehab on a regular basis to increase amount of physical activity.;Long Term: Add in home exercise to make exercise part of routine and to increase amount of physical activity.;Long Term: Exercising regularly at least 3-5 days a week.       Increase Strength and Stamina Yes       Intervention Provide advice, education, support and counseling about physical activity/exercise needs.;Develop an individualized exercise prescription for aerobic and resistive training based on initial evaluation findings, risk stratification, comorbidities and participant's personal goals.       Expected Outcomes Short Term: Increase workloads from initial exercise prescription for resistance, speed, and METs.;Short Term: Perform resistance training exercises routinely during rehab and add in resistance training at home;Long Term: Improve cardiorespiratory fitness, muscular endurance and strength as measured by increased METs and functional capacity ( )       Able to understand and use rate of perceived exertion (RPE) scale Yes       Intervention Provide education and explanation on how to use RPE scale       Expected Outcomes Short Term: Able to use RPE daily in rehab to express subjective intensity level;Long Term:  Able to use RPE to guide intensity level when exercising independently       Able to understand and use Dyspnea scale Yes       Intervention Provide education and explanation on how to use Dyspnea scale       Expected Outcomes Short Term: Able to use Dyspnea scale daily in rehab to express subjective sense of shortness of breath during exertion;Long Term: Able to use Dyspnea scale to guide intensity level when exercising independently       Knowledge and understanding of Target Heart Rate Range (THRR) Yes       Intervention Provide education and explanation of THRR  including how the numbers were predicted and where they are located for reference       Expected Outcomes Short Term: Able to state/look up THRR;Short Term: Able to use daily as guideline for intensity in rehab;Long Term: Able to use THRR to govern intensity when exercising independently       Understanding of Exercise Prescription Yes       Intervention Provide education, explanation, and written materials on patient's individual exercise prescription       Expected Outcomes Short Term: Able to explain program exercise prescription;Long Term: Able to explain home  exercise prescription to exercise independently                Exercise Goals Re-Evaluation:  Exercise Goals Re-Evaluation     Row Name 02/05/23 1446 03/05/23 1429 04/02/23 1151         Exercise Goal Re-Evaluation   Exercise Goals Review Increase Physical Activity;Able to understand and use Dyspnea scale;Understanding of Exercise Prescription;Increase Strength and Stamina;Knowledge and understanding of Target Heart Rate Range (THRR);Able to understand and use rate of perceived exertion (RPE) scale Increase Physical Activity;Able to understand and use Dyspnea scale;Understanding of Exercise Prescription;Increase Strength and Stamina;Knowledge and understanding of Target Heart Rate Range (THRR);Able to understand and use rate of perceived exertion (RPE) scale Increase Physical Activity;Able to understand and use Dyspnea scale;Understanding of Exercise Prescription;Increase Strength and Stamina;Knowledge and understanding of Target Heart Rate Range (THRR);Able to understand and use rate of perceived exertion (RPE) scale     Comments Wilkins is scheduled to start exercise on 12/26. Will monitor and progress as able. Harrold Donath has completed 7 exercise sessions. He exercises for 20 min on the Nustep and 10 min on the track. Nathan averages 2.2 METs at level 6 on the Nustep and 1.26 METs on the track. Harrold Donath performs the warmup and cooldown  standing/ seated dependent on his shortness of breath and pain. He tries to do as much as possible standing. Harrold Donath has progressed to walking the track for 10 min. He tolerates track walking fair and takes rest breaks as needed. Harrold Donath has increased his level on the Nustep as METs have increased. Nathan tolerates the increased level well. Harrold Donath is very motivated to exercise and improve his functional capacity. Harrold Donath has completed 14 exercise sessions. He exercises for 30 min on the Nustep. He averages 2.5 METs at level 9 on the Nustep. Harrold Donath performs the warmup and cooldown mostly seated due to his chronic knee pain. He is very limited to his chronic knee pain. He has been unable to walk the track, but he has increased his level significantly on the Nustep as METs have increased. Harrold Donath remains motivated to exercise and improve his functional capacity despite his limitations. Will continue to monitor and progress as able.     Expected Outcomes Through exercise at rehab and home, the patient will decrease shortness of breath with daily activites and feel confident in carrying out an exercise regimen at home Through exercise at rehab and home, the patient will decrease shortness of breath with daily activites and feel confident in carrying out an exercise regimen at home Through exercise at rehab and home, the patient will decrease shortness of breath with daily activites and feel confident in carrying out an exercise regimen at home              Nutrition & Weight - Outcomes:  Pre Biometrics - 04/29/23 1525       Pre Biometrics   Grip Strength 40 kg              Nutrition:  Nutrition Therapy & Goals - 04/27/23 1342       Nutrition Therapy   Diet Heart Healthy/Carbohydrate Consistent diet      Personal Nutrition Goals   Nutrition Goal Patient to improve diet quality by using the plate method as a guide for meal planning to include lean protein/plant protein, fruits, vegetables, whole  grains, nonfat dairy as part of a well-balanced diet.    Comments Reiss has medical history of combined heart failure, CKD3, HTN, CAD, DM2, hyperlipidemia.  Patient is taking mounjaro for DM2. He has maintained his weight since starting with our program. A1c and Lipids are well controlled. Patient will benefit from participation in pulmonary rehab for nutrition, exercise, and lifestyle modification.      Intervention Plan   Intervention Prescribe, educate and counsel regarding individualized specific dietary modifications aiming towards targeted core components such as weight, hypertension, lipid management, diabetes, heart failure and other comorbidities.;Nutrition handout(s) given to patient.    Expected Outcomes Short Term Goal: Understand basic principles of dietary content, such as calories, fat, sodium, cholesterol and nutrients.;Long Term Goal: Adherence to prescribed nutrition plan.             Nutrition Discharge:   Education Questionnaire Score:  Knowledge Questionnaire Score - 04/27/23 1609       Knowledge Questionnaire Score   Post Score 16/18             Goals reviewed with patient; copy given to patient.

## 2023-05-10 ENCOUNTER — Ambulatory Visit: Payer: Medicare HMO | Attending: Internal Medicine | Admitting: Internal Medicine

## 2023-05-10 NOTE — Progress Notes (Deleted)
 Cardiology Office Note:  .    Date:  05/10/2023  ID:  Thomas [name], DOB 1951/02/13, MRN [id number] PCP: Sharon Seller, NP  [facility] Providers Cardiologist:  Christell Constant, MD Cardiology APP:  Beatrice Lecher, PA-C { Click to update primary MD,subspecialty MD or APP then REFRESH:1}    CC: *** Consulted for the evaluation of *** at the behest of ***   History of Present Illness: .    Thomas [name] is a 73 y.o. male ***  @Discussed  the use of AI scribe software for clinical note transcription with the patient, who gave verbal consent to proceed.  History of Present Illness      Relevant histories: .  Social *** ROS: As per HPI.   Studies Reviewed: .   Cardiac Studies & Procedures   ______________________________________________________________________________________________ CARDIAC CATHETERIZATION  CARDIAC CATHETERIZATION 07/06/2017  Narrative  There is mild left ventricular systolic dysfunction.  LV end diastolic pressure is moderately elevated, 30 mm Hg.  The left ventricular ejection fraction is 45-50% by visual estimate.  There is no aortic valve stenosis.  Hemodynamic findings consistent with moderate to severe pulmonary hypertension.  CO 10.8 L/min; CI 4.2, Ao sat 91%; PA sat 72%; mean PCWP 21 mm Hg  Nonobstructive CAD.  Prox Cx lesion is 25% stenosed.  Non-obstructive CAD.  Moderate to severe pulmonary hypertension.  Will need management for pulmonary hypertension and diuresis.  Plan to increase Lasix dose starting tomorrow with f/u labs to be ordered.  Findings Coronary Findings Diagnostic  Dominance: Left  Left Anterior Descending There is mild diffuse disease throughout the vessel.  Left Circumflex Prox Cx lesion is 25% stenosed.  Intervention  No interventions have been documented.     ECHOCARDIOGRAM  ECHOCARDIOGRAM COMPLETE 05/13/2022  Narrative ECHOCARDIOGRAM REPORT    Patient Name:    Thomas [name] [facility] Date of Exam: 05/13/2022 Medical Rec #:  [id number]            Height:       69.0 in Accession #:    [id number]           Weight:       337.2 lb Date of Birth:  [birth year]            BSA:          2.580 m Patient Age:    71 years             BP:           166/98 mmHg Patient Gender: M                    HR:           74 bpm. Exam Location:  [address]  Procedure: 2D Echo, Cardiac Doppler, Color Doppler and Intracardiac Opacification Agent  Indications:    I42.8 Non ischemic cardiomyopathy  History:        Patient has prior history of Echocardiogram examinations, most recent 06/02/2017. CHF and Non ischemic cardiomyopathy, Morbid obesity, Signs/Symptoms:Shortness of Breath; Risk Factors:Hypertension, Diabetes and Dyslipidemia.  Sonographer:    Samule Ohm RDCS Referring Phys: [id number] Corky Crafts   Sonographer Comments: Technically difficult study due to poor echo windows and patient is obese. Image acquisition challenging due to patient body habitus. IMPRESSIONS   1. Left ventricular ejection fraction, by estimation, is 45 to 50%. The left ventricle has mildly decreased function. The left ventricle demonstrates global hypokinesis. There is moderate concentric left ventricular  hypertrophy. Left ventricular diastolic parameters are consistent with Grade I diastolic dysfunction (impaired relaxation). 2. Right ventricular systolic function is normal. The right ventricular size is not well visualized. There is moderately elevated pulmonary artery systolic pressure. 3. Left atrial size was mild to moderately dilated. 4. Right atrial size was moderately dilated. 5. The mitral valve is normal in structure. No evidence of mitral valve regurgitation. No evidence of mitral stenosis. 6. The aortic valve is tricuspid. There is moderate calcification of the aortic valve. There is mild thickening of the aortic valve. Aortic valve regurgitation is trivial. Aortic valve  sclerosis/calcification is present, without any evidence of aortic stenosis. 7. The inferior vena cava is dilated in size with <50% respiratory variability, suggesting right atrial pressure of 15 mmHg.  Comparison(s): No significant change from prior study.  FINDINGS Left Ventricle: Left ventricular ejection fraction, by estimation, is 45 to 50%. The left ventricle has mildly decreased function. The left ventricle demonstrates global hypokinesis. Definity contrast agent was given IV to delineate the left ventricular endocardial borders. The left ventricular internal cavity size was normal in size. There is moderate concentric left ventricular hypertrophy. Left ventricular diastolic parameters are consistent with Grade I diastolic dysfunction (impaired relaxation).  Right Ventricle: The right ventricular size is not well visualized. Right vetricular wall thickness was not well visualized. Right ventricular systolic function is normal. There is moderately elevated pulmonary artery systolic pressure. The tricuspid regurgitant velocity is 3.00 m/s, and with an assumed right atrial pressure of 15 mmHg, the estimated right ventricular systolic pressure is 51.0 mmHg.  Left Atrium: Left atrial size was mild to moderately dilated.  Right Atrium: Right atrial size was moderately dilated.  Pericardium: There is no evidence of pericardial effusion.  Mitral Valve: The mitral valve is normal in structure. No evidence of mitral valve regurgitation. No evidence of mitral valve stenosis.  Tricuspid Valve: The tricuspid valve is normal in structure. Tricuspid valve regurgitation is trivial. No evidence of tricuspid stenosis.  Aortic Valve: The aortic valve is tricuspid. There is moderate calcification of the aortic valve. There is mild thickening of the aortic valve. Aortic valve regurgitation is trivial. Aortic regurgitation PHT measures 425 msec. Aortic valve sclerosis/calcification is present, without any  evidence of aortic stenosis.  Pulmonic Valve: The pulmonic valve was not well visualized. Pulmonic valve regurgitation is trivial. No evidence of pulmonic stenosis.  Aorta: The aortic root, ascending aorta and aortic arch are all structurally normal, with no evidence of dilitation or obstruction.  Venous: The inferior vena cava is dilated in size with less than 50% respiratory variability, suggesting right atrial pressure of 15 mmHg.  IAS/Shunts: The atrial septum is grossly normal.   LEFT VENTRICLE PLAX 2D LVIDd:         5.05 cm   Diastology LVIDs:         3.60 cm   LV e' medial:    4.35 cm/s LV PW:         1.70 cm   LV E/e' medial:  19.4 LV IVS:        1.40 cm   LV e' lateral:   6.96 cm/s LVOT diam:     2.40 cm   LV E/e' lateral: 12.1 LV SV:         85 LV SV Index:   33 LVOT Area:     4.52 cm   RIGHT VENTRICLE             IVC RV S prime:  14.30 cm/s  IVC diam: 2.35 cm TAPSE (M-mode): 1.6 cm RVSP:           44.0 mmHg  LEFT ATRIUM             Index        RIGHT ATRIUM           Index LA diam:        5.00 cm 1.94 cm/m   RA Pressure: 8.00 mmHg LA Vol (A2C):   60.6 ml 23.49 ml/m  RA Area:     24.80 cm LA Vol (A4C):   82.8 ml 32.10 ml/m  RA Volume:   87.00 ml  33.73 ml/m LA Biplane Vol: 74.2 ml 28.76 ml/m AORTIC VALVE LVOT Vmax:   105.00 cm/s LVOT Vmean:  65.300 cm/s LVOT VTI:    0.188 m AI PHT:      425 msec  AORTA Ao Root diam: 3.70 cm Ao Asc diam:  3.60 cm  MITRAL VALVE                TRICUSPID VALVE MV Area (PHT): 3.26 cm     TR Peak grad:   36.0 mmHg MV Decel Time: 233 msec     TR Vmax:        300.00 cm/s MV E velocity: 84.40 cm/s   Estimated RAP:  8.00 mmHg MV A velocity: 108.00 cm/s  RVSP:           44.0 mmHg MV E/A ratio:  0.78 SHUNTS Systemic VTI:  0.19 m Systemic Diam: 2.40 cm  Jodelle Red MD Electronically signed by Jodelle Red MD Signature Date/Time: 05/13/2022/3:04:21 PM    Final           ______________________________________________________________________________________________      Results    *** Risk Assessment/Calculations:    {Does this patient have ATRIAL FIBRILLATION?:[phone]}  {This patient may be at risk for Amyloid. He has one or more dx on the problem list or PMH from the following list - Abnormal EKG, CHF, Aortic Stenosis, Proteinuria, LVH, Carpal Tunnel Syndrome, Biceps Tendon Rupture, Syncope. See list below or review PMH.  Diagnoses From Problem List           Noted     Heart failure with mildly reduced ejection fraction (HFmrEF) (HCC) 06/01/2017    Click HERE to open Cardiac Amyloid Screening SmartSet to order screening OR Click HERE to defer testing for 1 year or permanently :1}    Physical Exam:    VS:  There were no vitals taken for this visit.   Wt Readings from Last 3 Encounters:  04/20/23 (!) 137.4 kg  04/06/23 (!) 137.9 kg  03/23/23 (!) 138.4 kg    Gen: *** distress, *** obese/well nourished/malnourished   Neck: No JVD, *** carotid bruit Ears: Homero Fellers Sign Cardiac: No Rubs or Gallops, *** Murmur, ***cardia, *** radial pulses Respiratory: Clear to auscultation bilaterally, *** effort, ***  respiratory rate GI: Soft, nontender, non-distended *** MS: No *** edema; *** moves all extremities Integument: Skin feels *** Neuro:  At time of evaluation, alert and oriented to person/place/time/situation *** Psych: Normal affect, patient feels ***   ASSESSMENT AND PLAN: .    *** An EKG was ordered for *** and shows ***  Riley Lam, MD FASE [facility] Cardiologist [facility]  [address] [place], [place] [address] [phone]  8:02 AM

## 2023-05-11 ENCOUNTER — Encounter: Payer: Self-pay | Admitting: Internal Medicine

## 2023-05-12 ENCOUNTER — Other Ambulatory Visit (HOSPITAL_COMMUNITY): Payer: Self-pay

## 2023-05-14 DIAGNOSIS — R339 Retention of urine, unspecified: Secondary | ICD-10-CM | POA: Diagnosis not present

## 2023-05-14 DIAGNOSIS — Z466 Encounter for fitting and adjustment of urinary device: Secondary | ICD-10-CM | POA: Diagnosis not present

## 2023-05-24 ENCOUNTER — Encounter: Payer: Self-pay | Admitting: Nurse Practitioner

## 2023-05-24 ENCOUNTER — Other Ambulatory Visit (HOSPITAL_COMMUNITY): Payer: Self-pay

## 2023-05-24 ENCOUNTER — Ambulatory Visit (INDEPENDENT_AMBULATORY_CARE_PROVIDER_SITE_OTHER): Admitting: Nurse Practitioner

## 2023-05-24 VITALS — BP 102/80 | HR 70 | Temp 96.6°F | Resp 17 | Ht 69.0 in | Wt 304.0 lb

## 2023-05-24 DIAGNOSIS — R339 Retention of urine, unspecified: Secondary | ICD-10-CM

## 2023-05-24 DIAGNOSIS — I10 Essential (primary) hypertension: Secondary | ICD-10-CM

## 2023-05-24 DIAGNOSIS — I825Y2 Chronic embolism and thrombosis of unspecified deep veins of left proximal lower extremity: Secondary | ICD-10-CM | POA: Diagnosis not present

## 2023-05-24 DIAGNOSIS — E785 Hyperlipidemia, unspecified: Secondary | ICD-10-CM | POA: Diagnosis not present

## 2023-05-24 DIAGNOSIS — M170 Bilateral primary osteoarthritis of knee: Secondary | ICD-10-CM

## 2023-05-24 DIAGNOSIS — I5022 Chronic systolic (congestive) heart failure: Secondary | ICD-10-CM

## 2023-05-24 DIAGNOSIS — G4730 Sleep apnea, unspecified: Secondary | ICD-10-CM | POA: Diagnosis not present

## 2023-05-24 DIAGNOSIS — E1122 Type 2 diabetes mellitus with diabetic chronic kidney disease: Secondary | ICD-10-CM

## 2023-05-24 DIAGNOSIS — J9611 Chronic respiratory failure with hypoxia: Secondary | ICD-10-CM | POA: Diagnosis not present

## 2023-05-24 MED ORDER — AMLODIPINE BESYLATE 5 MG PO TABS
5.0000 mg | ORAL_TABLET | Freq: Every day | ORAL | 0 refills | Status: DC
Start: 2023-05-24 — End: 2023-07-20
  Filled 2023-05-24: qty 90, 90d supply, fill #0

## 2023-05-24 NOTE — Patient Instructions (Addendum)
 Decrease norvasc to 5 mg daily   To check blood pressure 1 hour AFTER you have had your medication Make sure you have been sitting at least 5 mins  Record and let us know.  Goal <140/90  Follow up late may early June    Make appt for yearly eye exam.  [facility] Address: [address] [place], [place] [address] [phone]  [facility], [place] Address: [address] [place], [place] [address] Phone: [phone]

## 2023-05-24 NOTE — Progress Notes (Signed)
 Careteam: Patient Care Team: Sharon Seller, NP as PCP - General (Geriatric Medicine) Christell Constant, MD as PCP - Cardiology (Cardiology) Clarene Duke, Karma Lew, RN as [facility] Management Kennon Rounds as Physician Assistant (Cardiology) Shon Millet, MD as Consulting Physician (Ophthalmology)  PLACE OF SERVICE:  [facility]  Advanced Directive information Does Patient Have a Medical Advance Directive?: Yes, Type of Advance Directive: Healthcare Power of [place];Living will, Does patient want to make changes to medical advance directive?: No - Patient declined  Allergies  Allergen Reactions   Lisinopril Cough and Other (See Comments)    Muscle pain    Chief Complaint  Patient presents with   Medication Management    Patient wants to come off some blood pressure medications.     Discussed the use of AI scribe software for clinical note transcription with the patient, who gave verbal consent to proceed.  History of Present Illness   Thomas BRITTEN is a 73 year old male with congestive heart failure who presents for a follow-up regarding his pulmonary rehabilitation and medication management.  He experiences persistent shortness of breath, particularly during morning exercises, with oxygen levels dropping below 88% during these activities. He does use supplemental oxygen during exercise, and his oxygen level was 90% during the visit. No significant swelling is present, and he is able to sleep flat without difficulty.  He has been attending pulmonary rehabilitation due to his congestive heart failure, which ended on May 04, 2023. During rehabilitation, he was able to increase the tension on exercise machines and used supplemental oxygen set to 4 L/min. He reports improvement in muscle strength but continues to experience shortness of breath.  He wants to reduce his blood pressure medications due to consistently low readings, such as 90/50 mmHg, and denies  feeling lightheaded or dizzy. He is currently taking carvedilol, hydralazine, losartan, and amlodipine. He also takes Lasix (furosemide) 40 mg twoce daily, which he increased due to previous leg swelling, but has since resolved.  He is on Jardiance, spironolactone, and Mounjaro for diabetes management, along with metformin. He reports numbness in his feet but no swelling, and his feet are described as dry and flaky.  He uses a CPAP machine for sleep apnea and attributes some of his breathing issues to a recalled [employer] DreamStation CPAP machine he used for over five years. He has not experienced breathing problems until recently, which he associates with his age and prolonged use of the machine.  He is considering changing his eye doctor and is due for a diabetic eye exam.     Review of Systems:  Review of Systems  Constitutional:  Negative for chills, fever and weight loss.  HENT:  Negative for tinnitus.   Respiratory:  Positive for shortness of breath. Negative for cough and sputum production.   Cardiovascular:  Negative for chest pain, palpitations and leg swelling.  Gastrointestinal:  Negative for abdominal pain, constipation, diarrhea and heartburn.  Genitourinary:  Negative for dysuria, frequency and urgency.  Musculoskeletal:  Positive for joint pain and myalgias. Negative for back pain and falls.  Skin: Negative.   Neurological:  Negative for dizziness and headaches.  Psychiatric/Behavioral:  Negative for depression and memory loss. The patient does not have insomnia.     Past Medical History:  Diagnosis Date   Acute on chronic diastolic CHF (congestive heart failure) (HCC) 06/01/2017   Arthritis    Cancer (HCC) 2010   Prostate   Chronic kidney disease  Diabetes mellitus    Diabetic neuropathy (HCC)    feet   DVT (deep venous thrombosis) (HCC)    Genetic testing 06/22/2016   Mr. Coury underwent genetic counseling and testing for hereditary cancer syndromes on 05/14/2016.  His results were negative for mutations in all 46 genes analyzed by [facility]'s 46-gene Common Hereditary Cancers Panel. Genes analyzed include: APC, ATM, AXIN2, BARD1, BMPR1A, BRCA1, BRCA2, BRIP1, CDH1, CDKN2A, CHEK2, CTNNA1, DICER1, EPCAM, GREM1, HOXB13, KIT, MEN1, MLH1, MSH2, MSH3, MSH6, MUTYH, NB   GERD (gastroesophageal reflux disease)    Hypertension    PE (pulmonary thromboembolism) (HCC)    Pneumonia    Sleep apnea    not wearing CPAP   Suprapubic catheter (HCC) 11/17/2019   Past Surgical History:  Procedure Laterality Date   CARDIOVERSION N/A 06/03/2017   Procedure: CARDIOVERSION;  Surgeon: Jake Bathe, MD;  Location: [facility] ENDOSCOPY;  Service: Cardiovascular;  Laterality: N/A;   CATARACT EXTRACTION Left 07/31/2021   Dr.Glenn, [facility]   COLONOSCOPY     COLONOSCOPY WITH PROPOFOL N/A 10/20/2018   Procedure: COLONOSCOPY WITH PROPOFOL;  Surgeon: Tressia Danas, MD;  Location: [facility] ENDOSCOPY;  Service: Gastroenterology;  Laterality: N/A;   HERNIA REPAIR     KNEE SURGERY     MULTIPLE TOOTH EXTRACTIONS     POLYPECTOMY  10/20/2018   Procedure: POLYPECTOMY;  Surgeon: Tressia Danas, MD;  Location: [facility] ENDOSCOPY;  Service: Gastroenterology;;   PROSTATE SURGERY     RADIOACTIVE SEED IMPLANT     RIGHT/LEFT HEART CATH AND CORONARY ANGIOGRAPHY N/A 07/06/2017   Procedure: RIGHT/LEFT HEART CATH AND CORONARY ANGIOGRAPHY;  Surgeon: Corky Crafts, MD;  Location: [facility] INVASIVE CV LAB;  Service: Cardiovascular;  Laterality: N/A;   SHOULDER SURGERY     TOTAL KNEE ARTHROPLASTY Right 11/19/2016   Procedure: RIGHT TOTAL KNEE ARTHROPLASTY;  Surgeon: Tarry Kos, MD;  Location: [facility] OR;  Service: Orthopedics;  Laterality: Right;   uretha surgery-2014     Social History:   reports that he has never smoked. He has never been exposed to tobacco smoke. He has never used smokeless tobacco. He reports that he does not drink alcohol and does not use drugs.  Family History  Problem  Relation Age of Onset   Breast cancer Mother 64       d.89   Breast cancer Sister 30       d.30   Leukemia Brother 18       d.20   Breast cancer Maternal Aunt 40       d.40s   Lung cancer Maternal Uncle    Prostate cancer Paternal Uncle    Prostate cancer Brother        recurred recently at age 70   Other Brother 15       spinal tumor   Cervical cancer Other 22       d.22   Cancer Sister 52       unspecified type   Cancer Maternal Uncle 9       unspecified type    Medications: Patient's Medications  New Prescriptions   No medications on file  Previous Medications   ACCU-CHEK SOFTCLIX LANCETS LANCETS    Use to test blood sugar daily   ACETAMINOPHEN (TYLENOL) 500 MG TABLET    Take by mouth.   ALCOHOL SWABS (B-D SINGLE USE SWABS REGULAR) PADS    Use in testing blood sugar daily. Dx: E11.22   AMLODIPINE (NORVASC) 10 MG TABLET    TAKE 1  TABLET EVERY DAY FOR HIGH BLOOD PRESSURE   ASPIRIN EC 81 MG TABLET    Take 81 mg by mouth daily.   ATORVASTATIN (LIPITOR) 40 MG TABLET    TAKE 1 TABLET EVERY DAY   BLOOD GLUCOSE CALIBRATION (ACCU-CHEK AVIVA) SOLN    Use once daily as directed dx E11.22   BLOOD GLUCOSE MONITORING SUPPL (TRUE METRIX AIR GLUCOSE METER) W/DEVICE KIT    1 Device by Does not apply route daily as needed. E11.22   CARVEDILOL (COREG) 12.5 MG TABLET    TAKE 1 TABLET TWICE DAILY WITH MEALS   CHOLECALCIFEROL (VITAMIN D3) 50 MCG (2000 UT) TABS    Take 1 tablet (2,000 Units) by mouth daily.   EMPAGLIFLOZIN (JARDIANCE) 10 MG TABS TABLET    Take 1 tablet (10 mg total) by mouth daily.   FUROSEMIDE (LASIX) 20 MG TABLET    TAKE 1 TABLET AS DIRECTED AS NEEDED   GLUCOSE BLOOD (ACCU-CHEK AVIVA PLUS) TEST STRIP    Use to test blood sugar daily.   HYDRALAZINE (APRESOLINE) 25 MG TABLET    TAKE 3 TABLETS THREE TIMES DAILY   LANCETS MISC. (ACCU-CHEK SOFTCLIX LANCET DEV) KIT    Use to test blood sugar daily   LOSARTAN (COZAAR) 50 MG TABLET    TAKE 1 TABLET EVERY DAY   METFORMIN  (GLUCOPHAGE) 500 MG TABLET    TAKE 1 TABLET TWICE DAILY WITH MEALS   POTASSIUM CHLORIDE SA (KLOR-CON M) 20 MEQ TABLET    TAKE 1 TABLET TWICE DAILY   RIVAROXABAN (XARELTO) 20 MG TABS TABLET    Take 1 tablet (20 mg total) by mouth daily with supper.   SENNA 8.7 MG CHEW    Chew 1 tablet by mouth as needed.   SPIRONOLACTONE (ALDACTONE) 50 MG TABLET    Take 50 mg by mouth daily.   TIRZEPATIDE (MOUNJARO) 15 MG/0.5ML PEN    Inject 15 mg into the skin once a week.   TRAMADOL (ULTRAM) 50 MG TABLET    Take 1 tablet (50 mg total) by mouth every 6 (six) hours as needed for pain   TRUEPLUS LANCETS 28G MISC    TEST BLOOD SUGAR EVERY DAY AS NEEDED   ZINC GLUCONATE 50 MG TABLET    Take 1 tablet (50 mg total) by mouth daily.  Modified Medications   No medications on file  Discontinued Medications   No medications on file    Physical Exam:  Vitals:   05/24/23 0948  BP: 102/80  Pulse: 70  Resp: 17  Temp: (!) 96.6 F (35.9 C)  SpO2: 90%  Weight: (!) 304 lb (137.9 kg)  Height: 5\' 9"  (1.753 m)   Body mass index is 44.89 kg/m. Wt Readings from Last 3 Encounters:  05/24/23 (!) 304 lb (137.9 kg)  04/20/23 (!) 302 lb 14.6 oz (137.4 kg)  04/06/23 (!) 304 lb 0.2 oz (137.9 kg)    Physical Exam Constitutional:      General: He is not in acute distress.    Appearance: He is well-developed. He is not diaphoretic.  HENT:     Head: Normocephalic and atraumatic.     Right Ear: External ear normal.     Left Ear: External ear normal.     Mouth/Throat:     Pharynx: No oropharyngeal exudate.  Eyes:     Conjunctiva/sclera: Conjunctivae normal.     Pupils: Pupils are equal, round, and reactive to light.  Cardiovascular:     Rate and Rhythm: Normal rate and regular  rhythm.     Heart sounds: Normal heart sounds.  Pulmonary:     Effort: Pulmonary effort is normal.     Breath sounds: Normal breath sounds.  Abdominal:     General: Bowel sounds are normal.     Palpations: Abdomen is soft.   Musculoskeletal:        General: No tenderness.     Cervical back: Normal range of motion and neck supple.     Right lower leg: No edema.     Left lower leg: No edema.  Skin:    General: Skin is warm and dry.  Neurological:     Mental Status: He is alert and oriented to person, place, and time.     Motor: Weakness present.     Gait: Gait abnormal.     Labs reviewed: Basic Metabolic Panel: Recent Labs    07/03/22 1120 07/14/22 0926 11/03/22 1000  NA 141 140 142  K 4.6 3.9 4.9  CL 101 103 106  CO2 23 20 21   GLUCOSE 141* 124* 102*  BUN 22 24 16   CREATININE 1.70* 1.34* 1.30*  CALCIUM 9.7 8.9 9.9   Liver Function Tests: Recent Labs    07/03/22 1120 11/03/22 1000  AST 26 21  ALT 44 36  ALKPHOS 104 94  BILITOT 0.8 0.6  PROT 7.6 7.3  ALBUMIN 4.3 4.2   No results for input(s): "LIPASE", "AMYLASE" in the last 8760 hours. No results for input(s): "AMMONIA" in the last 8760 hours. CBC: Recent Labs    11/03/22 1000  WBC 8.6  HGB 14.0  HCT 43.2  MCV 88  PLT 269   Lipid Panel: Recent Labs    11/03/22 1000  CHOL 109  HDL 46  LDLCALC 48  TRIG 75  CHOLHDL 2.4   TSH: No results for input(s): "TSH" in the last 8760 hours. A1C: Lab Results  Component Value Date   HGBA1C 5.6 11/03/2022     Assessment/Plan Assessment and Plan    Congestive Heart Failure Persistent dyspnea and decreased oxygen saturation likely due to heart failure. Emphasized importance of maintaining blood pressure to reduce cardiac workload. Discussed potential renal strain from diuretics. - Continue carvedilol, hydralazine, and losartan and jardiance.  - Decrease Lasix to once daily. - Order blood work to monitor renal function. - Continue pulmonary rehabilitation exercises. - has follow up with cardiology in July   Hypertension Blood pressure low, acceptable given heart failure without symptoms however was increasing lasix due to LE edema which could be due to amlodipine. Discussed  reducing amlodipine to avoid excessive hypotension. - Decrease amlodipine to 5 mg daily. - Monitor blood pressure at home and maintain a log.  Type 2 Diabetes Mellitus On Mounjaro, Jardiance, and metformin. Plans to check A1c for glycemic control. - Order blood work to check A1c levels. - Continue Mounjaro, Jardiance, and metformin. Requires diabetic eye exam. Diabetic foot exam performed. Discussed importance of regular screenings. - Refer to eye specialist for diabetic eye exam.  Obstructive Sleep Apnea - Continue using CPAP.  Chronic hypoxemic respiratory failure (HCC) Continues on long term O2  Primary osteoarthritis of both knees Ongoing, continues on tramadol.   Hyperlipidemia LDL goal <70 Continues on lipitor. LDL at goal on last lab  Urine retention Followed by urology, continues with chronic foley catheter   Chronic deep vein thrombosis (DVT) of proximal vein of left lower extremity (HCC) Continues on xarelto.   Morbid obesity (HCC) --education provided on healthy weight loss through increase in physical  activity and proper nutrition   Return in about 2 months (around 07/24/2023). For blood pressure.   [name]. Biagio Borg [facility] [phone]

## 2023-05-25 ENCOUNTER — Encounter: Payer: Self-pay | Admitting: Nurse Practitioner

## 2023-05-25 DIAGNOSIS — I5020 Unspecified systolic (congestive) heart failure: Secondary | ICD-10-CM | POA: Diagnosis not present

## 2023-05-25 DIAGNOSIS — J9611 Chronic respiratory failure with hypoxia: Secondary | ICD-10-CM | POA: Diagnosis not present

## 2023-05-25 DIAGNOSIS — I2724 Chronic thromboembolic pulmonary hypertension: Secondary | ICD-10-CM | POA: Diagnosis not present

## 2023-05-25 LAB — CBC WITH DIFFERENTIAL/PLATELET
Absolute Lymphocytes: 1310 {cells}/uL (ref 850–3900)
Absolute Monocytes: 650 {cells}/uL (ref 200–950)
Basophils Absolute: 49 {cells}/uL (ref 0–200)
Basophils Relative: 0.5 %
Eosinophils Absolute: 262 {cells}/uL (ref 15–500)
Eosinophils Relative: 2.7 %
HCT: 44.2 % (ref 38.5–50.0)
Hemoglobin: 14.2 g/dL (ref 13.2–17.1)
MCH: 28.6 pg (ref 27.0–33.0)
MCHC: 32.1 g/dL (ref 32.0–36.0)
MCV: 88.9 fL (ref 80.0–100.0)
MPV: 10.7 fL (ref 7.5–12.5)
Monocytes Relative: 6.7 %
Neutro Abs: 7430 {cells}/uL (ref 1500–7800)
Neutrophils Relative %: 76.6 %
Platelets: 281 10*3/uL (ref 140–400)
RBC: 4.97 10*6/uL (ref 4.20–5.80)
RDW: 13.1 % (ref 11.0–15.0)
Total Lymphocyte: 13.5 %
WBC: 9.7 10*3/uL (ref 3.8–10.8)

## 2023-05-25 LAB — COMPLETE METABOLIC PANEL WITHOUT GFR
AG Ratio: 1.2 (calc) (ref 1.0–2.5)
ALT: 14 U/L (ref 9–46)
AST: 12 U/L (ref 10–35)
Albumin: 4.1 g/dL (ref 3.6–5.1)
Alkaline phosphatase (APISO): 75 U/L (ref 35–144)
BUN/Creatinine Ratio: 18 (calc) (ref 6–22)
BUN: 28 mg/dL — ABNORMAL HIGH (ref 7–25)
CO2: 25 mmol/L (ref 20–32)
Calcium: 9.2 mg/dL (ref 8.6–10.3)
Chloride: 106 mmol/L (ref 98–110)
Creat: 1.55 mg/dL — ABNORMAL HIGH (ref 0.70–1.28)
Globulin: 3.3 g/dL (ref 1.9–3.7)
Glucose, Bld: 131 mg/dL — ABNORMAL HIGH (ref 65–99)
Potassium: 5.2 mmol/L (ref 3.5–5.3)
Sodium: 141 mmol/L (ref 135–146)
Total Bilirubin: 0.6 mg/dL (ref 0.2–1.2)
Total Protein: 7.4 g/dL (ref 6.1–8.1)

## 2023-05-25 LAB — HEMOGLOBIN A1C
Hgb A1c MFr Bld: 5.5 %{Hb} (ref <5.7)
Mean Plasma Glucose: 111 mg/dL
eAG (mmol/L): 6.2 mmol/L

## 2023-05-27 ENCOUNTER — Ambulatory Visit: Payer: Self-pay

## 2023-05-28 ENCOUNTER — Other Ambulatory Visit (HOSPITAL_COMMUNITY): Payer: Self-pay

## 2023-05-28 NOTE — Patient Instructions (Signed)
 Visit Information  Thank you for taking time to visit with me today. Please don't hesitate to contact me if I can be of assistance to you before our next scheduled appointment.  Our next appointment is by telephone on 06/24/23 at 10:30 AM Please call the care guide team at [phone] if you need to cancel or reschedule your appointment.   Following is a copy of your care plan:   Goals Addressed             This Visit's Progress    COMPLETED: To improve shortness of breath       Care Coordination Interventions: See new goal     VBCI RN Care Plan related to dyspnea secondary to Pulmonary Hypertension       Problems:  Knowledge Deficits related to CTEPH (chronic thromboembolic pulmonary hypertension)  Goal: Over the next 90 days the Patient will demonstrate Ongoing adherence to prescribed treatment plan for Pulmonary Hypertension as evidenced by will experience no ED visits and or hospitalizations related to Pulmonary Hypertension   Interventions:   COPD Interventions: Provided patient with basic written and verbal education on self care/management/and exacerbation prevention of hypoxia secondary to Pulmonary Hypertension  Advised patient to track and manage potential triggers that may worsen his condition  Provided instruction about proper use of medications used for management of shortness of breath related to Pulmonary Hypertension  Advised patient to engage in light exercise as tolerated 3-5 days a week to aid in the the management of shortness of breath and deconditioning  Provided education about and advised patient to utilize infection prevention strategies to reduce risk of respiratory infection Discussed the importance of adequate rest and management of fatigue  Assessed social determinant of health barriers  Patient Self-Care Activities:  Attend all scheduled provider appointments Call pharmacy for medication refills 3-7 days in advance of running out of  medications Call provider office for new concerns or questions  Take medications as prescribed    Plan:  Next PCP appointment scheduled for: 08/02/23 @10 :40 AM Telephone follow up appointment with care management team member scheduled for: 06/24/23 @10 :30 AM      VBCI RN Care Plan related to Heart Failure       Problems:  Knowledge Deficits related to CHF  Goal: Over the next 30-45 days the Patient will demonstrate Improved health management independence as evidenced by patient will adhere to weighing himself daily and following the Heart Failure Self-Management Guidelines as directed         Interventions:   Heart Failure Interventions: Basic overview and discussion of pathophysiology of Heart Failure reviewed Provided education on low sodium diet Reviewed Heart Failure Action Plan in depth and provided written copy Assessed need for readable accurate scales in home Provided education about placing scale on hard, flat surface Advised patient to weigh each morning after emptying bladder Discussed importance of daily weight and advised patient to weigh and record daily Reviewed role of diuretics in prevention of fluid overload and management of heart failure; Discussed the importance of keeping all appointments with provider  Patient Self-Care Activities:  Attend all scheduled provider appointments Call pharmacy for medication refills 3-7 days in advance of running out of medications Call provider office for new concerns or questions  call office if I gain more than 2 pounds in one day or 5 pounds in one week keep legs up while sitting use salt in moderation watch for swelling in feet, ankles and legs every day weigh myself daily follow  rescue plan if symptoms flare-up track symptoms and what helps feel better or worse  Plan:  Next Cardiology appointment scheduled for: 08/26/23 @9 :20 AM Telephone follow up appointment with care management team member scheduled for: 06/24/23 @10 :30  AM         Please call 1-800-273-TALK (toll free, 24 hour hotline) if you are experiencing a Mental Health or Behavioral Health Crisis or need someone to talk to.  Patient verbalizes understanding of instructions and care plan provided today and agrees to view in MyChart. Active MyChart status and patient understanding of how to access instructions and care plan via MyChart confirmed with patient.     [name] RN BSN CCM [place]  [facility], [facility] Health Nurse Care Coordinator  Direct Dial: [phone] Website: [email or web address]

## 2023-05-28 NOTE — Patient Outreach (Signed)
 Complex Care Management   Visit Note  05/28/2023  Name:  Thomas [name] MRN: [id number] DOB: Mar 16, 1950  Situation: Referral received for Complex Care Management related to  CHF and Pulmonary Hypertension   I obtained verbal consent from patient.  Visit completed with patient  on the phone  Background:   Past Medical History:  Diagnosis Date   Acute on chronic diastolic CHF (congestive heart failure) (HCC) 06/01/2017   Arthritis    Cancer (HCC) 2010   Prostate   Chronic kidney disease    Diabetes mellitus    Diabetic neuropathy (HCC)    feet   DVT (deep venous thrombosis) (HCC)    Genetic testing 06/22/2016   Mr. Allegretto underwent genetic counseling and testing for hereditary cancer syndromes on 05/14/2016. His results were negative for mutations in all 46 genes analyzed by [facility]'s 46-gene Common Hereditary Cancers Panel. Genes analyzed include: APC, ATM, AXIN2, BARD1, BMPR1A, BRCA1, BRCA2, BRIP1, CDH1, CDKN2A, CHEK2, CTNNA1, DICER1, EPCAM, GREM1, HOXB13, KIT, MEN1, MLH1, MSH2, MSH3, MSH6, MUTYH, NB   GERD (gastroesophageal reflux disease)    Hypertension    PE (pulmonary thromboembolism) (HCC)    Pneumonia    Sleep apnea    not wearing CPAP   Suprapubic catheter (HCC) 11/17/2019    Assessment: Patient Reported Symptoms:  Cognitive Alert and oriented to person, place, and time  Neurological No symptoms reported    HEENT No symptoms reported    Cardiovascular Other: weight gain  Respiratory Shortness of breath    Endocrine Other (neuropathy in feet)    Gastrointestinal Constipation    Genitourinary Other indwelling catheter  Integumentary No symptoms reported    Musculoskeletal Muscle pain    Psychosocial No symptoms reported     Vitals:   05/27/23 1425  Pulse: 71  SpO2: 93%    Medications Reviewed Today     Reviewed by Riley Churches, RN (Registered Nurse) on 05/27/23 at 1440  Med List Status: <None>   Medication Order Taking? Sig Documenting  Provider Last Dose Status Informant  Accu-Chek Softclix Lancets lancets [id number]  Use to test blood sugar daily Sharon Seller, NP  Active   acetaminophen (TYLENOL) 500 MG tablet [id number] Yes Take by mouth. [provider] Taking Active   Alcohol Swabs (B-D SINGLE USE SWABS REGULAR) PADS [id number]  Use in testing blood sugar daily. Dx: E11.22 Sharon Seller, NP  Active   amLODipine (NORVASC) 5 MG tablet [id number] Yes Take 1 tablet (5 mg total) by mouth daily. Sharon Seller, NP Taking Active   aspirin EC 81 MG tablet [id number] Yes Take 81 mg by mouth daily. [provider] Taking Active Self  atorvastatin (LIPITOR) 40 MG tablet [id number] Yes TAKE 1 TABLET EVERY DAY  Patient taking differently: Take 40 mg by mouth daily. Patient is taking daily in the evening   Sharon Seller, NP Taking Active   Blood Glucose Calibration (ACCU-CHEK AVIVA) SOLN [id number]  Use once daily as directed dx E11.22 Sharon Seller, NP  Active   Blood Glucose Monitoring Suppl (TRUE METRIX AIR GLUCOSE METER) w/Device KIT [id number]  1 Device by Does not apply route daily as needed. E11.22 Sharon Seller, NP  Active   carvedilol (COREG) 12.5 MG tablet [id number] Yes TAKE 1 TABLET TWICE DAILY WITH MEALS Janyth Contes, [name], NP Taking Active   Cholecalciferol (VITAMIN D3) 50 MCG (2000 UT) TABS [id number] No Take 1 tablet (2,000 Units) by mouth daily.  Patient not taking: Reported on 05/27/2023  Not Taking Active   empagliflozin (JARDIANCE) 10 MG TABS tablet [id number] Yes Take 1 tablet (10 mg total) by mouth daily. Sharon Seller, NP Taking Active   furosemide (LASIX) 20 MG tablet [id number] Yes TAKE 1 TABLET AS DIRECTED AS NEEDED Tereso Newcomer T, PA-C Taking Active   glucose blood (ACCU-CHEK AVIVA PLUS) test strip [id number]  Use to test blood sugar daily. Sharon Seller, NP  Active   hydrALAZINE (APRESOLINE) 25 MG tablet [id number] Yes TAKE 3 TABLETS THREE TIMES DAILY Janyth Contes,  [name], NP Taking Active   Lancets Misc. (ACCU-CHEK SOFTCLIX LANCET DEV) KIT [id number]  Use to test blood sugar daily Sharon Seller, NP  Active   losartan (COZAAR) 50 MG tablet [id number] Yes TAKE 1 TABLET EVERY DAY Sharon Seller, NP Taking Active   metFORMIN (GLUCOPHAGE) 500 MG tablet [id number] Yes TAKE 1 TABLET TWICE DAILY WITH MEALS Janyth Contes [name], NP Taking Active   potassium chloride SA (KLOR-CON M) 20 MEQ tablet [id number] Yes TAKE 1 TABLET TWICE DAILY Sharon Seller, NP Taking Active   rivaroxaban (XARELTO) 20 MG TABS tablet [id number] Yes Take 1 tablet (20 mg total) by mouth daily with supper. Sharon Seller, NP Taking Active   Senna 8.7 MG CHEW [id number] Yes Chew 1 tablet by mouth as needed. Sharon Seller, NP Taking Active   spironolactone (ALDACTONE) 50 MG tablet [id number] Yes Take 50 mg by mouth daily. Corky Crafts, MD Taking Active   tirzepatide [facility]) 15 MG/0.5ML Pen [id number] Yes Inject 15 mg into the skin once a week. Christell Constant, MD Taking Active   traMADol (ULTRAM) 50 MG tablet [id number] No Take 1 tablet (50 mg total) by mouth every 6 (six) hours as needed for pain  Patient not taking: Reported on 05/27/2023   Sharon Seller, NP Not Taking Active   TRUEplus Lancets 28G MISC [id number]  TEST BLOOD SUGAR EVERY DAY AS NEEDED Sharon Seller, NP  Active   zinc gluconate 50 MG tablet [id number] Yes Take 1 tablet (50 mg total) by mouth daily. Ngetich, Donalee Citrin, NP Taking Active             Recommendation:   Specialty provider follow-up 08/26/23 @9 :20 AM  Follow Up Plan:   Telephone follow up appointment date/time:  06/24/23 @10 :30 AM  [name] RN BSN CCM [place]  [facility], [facility] Health Nurse Care Coordinator  Direct Dial: [phone] Website: [email or web address]

## 2023-05-31 DIAGNOSIS — H25811 Combined forms of age-related cataract, right eye: Secondary | ICD-10-CM | POA: Diagnosis not present

## 2023-05-31 DIAGNOSIS — H35361 Drusen (degenerative) of macula, right eye: Secondary | ICD-10-CM | POA: Diagnosis not present

## 2023-05-31 DIAGNOSIS — E113293 Type 2 diabetes mellitus with mild nonproliferative diabetic retinopathy without macular edema, bilateral: Secondary | ICD-10-CM | POA: Diagnosis not present

## 2023-05-31 DIAGNOSIS — H35372 Puckering of macula, left eye: Secondary | ICD-10-CM | POA: Diagnosis not present

## 2023-05-31 LAB — HM DIABETES EYE EXAM

## 2023-06-10 ENCOUNTER — Other Ambulatory Visit: Payer: Self-pay | Admitting: Interventional Cardiology

## 2023-06-14 DIAGNOSIS — R339 Retention of urine, unspecified: Secondary | ICD-10-CM | POA: Diagnosis not present

## 2023-06-24 ENCOUNTER — Other Ambulatory Visit: Payer: Self-pay

## 2023-06-24 VITALS — Ht 69.0 in | Wt 303.0 lb

## 2023-06-24 DIAGNOSIS — I272 Pulmonary hypertension, unspecified: Secondary | ICD-10-CM

## 2023-06-24 DIAGNOSIS — I509 Heart failure, unspecified: Secondary | ICD-10-CM

## 2023-06-24 NOTE — Patient Outreach (Addendum)
 Complex Care Management   Visit Note  06/24/2023  Name:  Thomas [name] MRN: [id number] DOB: November 16, 1950  Situation: Referral received for Complex Care Management related to CHF, Pulmonary Hypertension, OSA w/CPAP.  I obtained verbal consent from Patient.  Visit completed with patient on the phone  Background:   Past Medical History:  Diagnosis Date   Acute on chronic diastolic CHF (congestive heart failure) (HCC) 06/01/2017   Arthritis    Cancer (HCC) 2010   Prostate   Chronic kidney disease    Diabetes mellitus    Diabetic neuropathy (HCC)    feet   DVT (deep venous thrombosis) (HCC)    Genetic testing 06/22/2016   Mr. Sparger underwent genetic counseling and testing for hereditary cancer syndromes on 05/14/2016. His results were negative for mutations in all 46 genes analyzed by [facility]'s 46-gene Common Hereditary Cancers Panel. Genes analyzed include: APC, ATM, AXIN2, BARD1, BMPR1A, BRCA1, BRCA2, BRIP1, CDH1, CDKN2A, CHEK2, CTNNA1, DICER1, EPCAM, GREM1, HOXB13, KIT, MEN1, MLH1, MSH2, MSH3, MSH6, MUTYH, NB   GERD (gastroesophageal reflux disease)    Hypertension    PE (pulmonary thromboembolism) (HCC)    Pneumonia    Sleep apnea    not wearing CPAP   Suprapubic catheter (HCC) 11/17/2019    Assessment: Patient Reported Symptoms:  Cognitive Cognitive Status: Alert and oriented to person, place, and time Cognitive/Intellectual Conditions Management [RPT]: None reported or documented in medical history or problem list   Health Maintenance Behaviors: Annual physical exam  Neurological Neurological Review of Symptoms: Other: [place] Neurological Symptoms/Conditions [RPT]: restless legs new problem, will discuss w/PCP during upcoming visit Neurological Management Strategies: Routine screening (will discuss w/PCP during upcoming visit) Neurological Self-Management Outcome: 3 (uncertain)  HEENT HEENT Symptoms Reported: Not assessed      Cardiovascular Cardiovascular Symptoms  Reported: No symptoms reported Does patient have uncontrolled Hypertension?: No Cardiovascular Conditions: Heart failure, Dysrhythmia, Hypertension (Pulmonary Hypertension) Cardiovascular Management Strategies: Medication therapy, Routine screening Weight: (!) 303 lb (137.4 kg) Cardiovascular Self-Management Outcome: 3 (uncertain)  Respiratory Respiratory Symptoms Reported: Shortness of breath Respiratory Conditions: Shortness of breath, Sleep disordered breathing (Pulmonary Hypertension on continuous Oxygen ) Respiratory Self-Management Outcome: 3 (uncertain)  Endocrine Patient reports the following symptoms related to hypoglycemia or hyperglycemia : No symptoms reported Is patient diabetic?: Yes Is patient checking blood sugars at home?: No Endocrine Conditions: Diabetes Endocrine Management Strategies: Medication therapy, Routine screening, Diet modification Endocrine Self-Management Outcome: 4 (good)  Gastrointestinal Gastrointestinal Symptoms Reported: Not assessed      Genitourinary Genitourinary Symptoms Reported: Other Other Genitourinary Symptoms: Urinary Retention Additional Genitourinary Details: [facility] for Catheter changes Genitourinary Conditions: Unable to void/empty, Other Other Genitourinary Conditions: Benign Prostatic Hyperplasia w/urinary obstruction Genitourinary Management Strategies: Catheter, indwelling Indwelling Catheter Inserted: 06/14/23 Genitourinary Self-Management Outcome: 4 (good)  Integumentary Integumentary Symptoms Reported: Not assessed    Musculoskeletal Musculoskelatal Symptoms Reviewed: Not assessed        Psychosocial Psychosocial Symptoms Reported: Not assessed     Quality of Family Relationships: involved, supportive      05/27/2023    2:49 PM  Depression screen PHQ 2/9  Decreased Interest 1  Down, Depressed, Hopeless 1  PHQ - 2 Score 2  Altered sleeping 0  Tired, decreased energy 1  Change in appetite 0  Feeling bad or  failure about yourself  0  Trouble concentrating 0  Moving slowly or fidgety/restless 0  Suicidal thoughts 0  PHQ-9 Score 3  Difficult doing work/chores Not difficult at all    There were no vitals  filed for this visit.  Medications Reviewed Today     Reviewed by Kaylene Pascal, RN (Registered Nurse) on 06/24/23 at 1027  Med List Status: <None>   Medication Order Taking? Sig Documenting Provider Last Dose Status Informant  Accu-Chek Softclix Lancets lancets [id number] No Use to test blood sugar daily Verma Gobble, NP Taking Active   acetaminophen  (TYLENOL ) 500 MG tablet [id number] No Take by mouth. [provider] Taking Active   Alcohol  Swabs (B-D SINGLE USE SWABS REGULAR) PADS [id number] No Use in testing blood sugar daily. Dx: E11.22 Verma Gobble, NP Taking Active   amLODipine  (NORVASC ) 5 MG tablet [id number] No Take 1 tablet (5 mg total) by mouth daily. Verma Gobble, NP Taking Active   aspirin  EC 81 MG tablet [id number] No Take 81 mg by mouth daily. [provider] Taking Active Self  atorvastatin  (LIPITOR) 40 MG tablet [id number] No TAKE 1 TABLET EVERY DAY  Patient taking differently: Take 40 mg by mouth daily. Patient is taking daily in the evening   [name], Jessica K, NP Taking Active   Blood Glucose Calibration (ACCU-CHEK AVIVA) SOLN [id number] No Use once daily as directed dx E11.22 [name], Jessica K, NP Taking Active   Blood Glucose Monitoring Suppl (TRUE METRIX AIR GLUCOSE METER) w/Device KIT [id number] No 1 Device by Does not apply route daily as needed. E11.22 Verma Gobble, NP Taking Active   carvedilol  (COREG ) 12.5 MG tablet [id number] No TAKE 1 TABLET TWICE DAILY WITH MEALS Roselie Conger, Jessica K, NP Taking Active   Cholecalciferol  (VITAMIN D3) 50 MCG (2000 UT) TABS [id number] No Take 1 tablet (2,000 Units) by mouth daily.  Patient not taking: Reported on 05/27/2023    Not Taking Active   empagliflozin  (JARDIANCE ) 10 MG TABS tablet [id number]  No Take 1 tablet (10 mg total) by mouth daily. Verma Gobble, NP Taking Active   furosemide  (LASIX ) 20 MG tablet [id number] No TAKE 1 TABLET AS DIRECTED AS NEEDED Marlyse Single T, PA-C Taking Active   glucose blood (ACCU-CHEK AVIVA PLUS) test strip [id number] No Use to test blood sugar daily. Verma Gobble, NP Taking Active   hydrALAZINE  (APRESOLINE ) 25 MG tablet [id number] No TAKE 3 TABLETS THREE TIMES DAILY Roselie Conger, Jessica K, NP Taking Active   Lancets Misc. (ACCU-CHEK SOFTCLIX LANCET DEV) KIT [id number] No Use to test blood sugar daily Verma Gobble, NP Taking Active   losartan  (COZAAR ) 50 MG tablet [id number] No TAKE 1 TABLET EVERY DAY [name], Jessica K, NP Taking Active   metFORMIN  (GLUCOPHAGE ) 500 MG tablet [id number] No TAKE 1 TABLET TWICE DAILY WITH MEALS [name], Jessica K, NP Taking Active   potassium chloride  SA (KLOR-CON  M) 20 MEQ tablet [id number] No TAKE 1 TABLET TWICE DAILY [name], Jessica K, NP Taking Active   rivaroxaban  (XARELTO ) 20 MG TABS tablet [id number] No Take 1 tablet (20 mg total) by mouth daily with supper. Verma Gobble, NP Taking Active   Senna 8.7 MG CHEW [id number] No Chew 1 tablet by mouth as needed. Verma Gobble, NP Taking Active   spironolactone  (ALDACTONE ) 25 MG tablet [id number]  TAKE 1 TABLET EVERY DAY Varanasi, Jayadeep S, MD  Active   spironolactone  (ALDACTONE ) 50 MG tablet [id number] No Take 50 mg by mouth daily. Lucendia Rusk, MD Taking Active   tirzepatide  (MOUNJARO ) 15 MG/0.5ML Pen [id number] No Inject 15 mg into the skin once a week. Jann Melody, MD Taking Active   traMADol  (ULTRAM ) 50 MG tablet [id number] No Take 1  tablet (50 mg total) by mouth every 6 (six) hours as needed for pain  Patient not taking: Reported on 05/27/2023   Verma Gobble, NP Not Taking Active   TRUEplus Lancets 28G MISC [id number] No TEST BLOOD SUGAR EVERY DAY AS NEEDED [name], Jessica K, NP Taking Active   zinc  gluconate 50 MG tablet [id number]  No Take 1 tablet (50 mg total) by mouth daily. Ngetich, Elijio [place], NP Taking Active             Recommendation:   PCP Follow-up Monday, June 16 at 10:40 AM  Follow Up Plan:   Telephone follow up appointment date/time:  Wednesday, June 18 at 10:30 AM Referral to Care Guide for resources that may help with paying an outstanding medical bill for CPAP supplies   Louanne Roussel RN BSN CCM [employer] [facility]  [facility], [facility] Health Nurse Care Coordinator  Direct Dial: [phone] Website: [email or web address]

## 2023-06-24 NOTE — Patient Instructions (Signed)
 Visit Information  Thank you for taking time to visit with me today. Please don't hesitate to contact me if I can be of assistance to you before our next scheduled appointment.  Your next care management appointment is by telephone on Wednesday, June 16 at 10:30 AM  Please call the care guide team at [phone] if you need to cancel, schedule, or reschedule an appointment.   Please call 1-800-273-TALK (toll free, 24 hour hotline) if you are experiencing a Mental Health or Behavioral Health Crisis or need someone to talk to.  Louanne Roussel RN BSN CCM [place]  [facility], [facility] Health Nurse Care Coordinator  Direct Dial: [phone] Website: [email or web address]

## 2023-06-24 NOTE — Addendum Note (Signed)
 Addended by: Kaylene Pascal on: 06/24/2023 11:02 AM   Modules accepted: Orders

## 2023-07-02 ENCOUNTER — Inpatient Hospital Stay (HOSPITAL_BASED_OUTPATIENT_CLINIC_OR_DEPARTMENT_OTHER)
Admission: EM | Admit: 2023-07-02 | Discharge: 2023-07-06 | DRG: 309 | Disposition: A | Discharge status: Home / Self Care | Attending: Internal Medicine | Admitting: Internal Medicine

## 2023-07-02 ENCOUNTER — Other Ambulatory Visit: Payer: Self-pay

## 2023-07-02 ENCOUNTER — Encounter (HOSPITAL_BASED_OUTPATIENT_CLINIC_OR_DEPARTMENT_OTHER): Payer: Self-pay

## 2023-07-02 ENCOUNTER — Ambulatory Visit: Payer: Self-pay

## 2023-07-02 ENCOUNTER — Other Ambulatory Visit (HOSPITAL_COMMUNITY): Payer: Self-pay

## 2023-07-02 ENCOUNTER — Encounter: Payer: Self-pay | Admitting: Adult Health

## 2023-07-02 ENCOUNTER — Ambulatory Visit: Admitting: Adult Health

## 2023-07-02 ENCOUNTER — Emergency Department (HOSPITAL_BASED_OUTPATIENT_CLINIC_OR_DEPARTMENT_OTHER)

## 2023-07-02 VITALS — BP 120/80 | HR 78 | Temp 98.0°F | Wt 303.0 lb

## 2023-07-02 DIAGNOSIS — I4891 Unspecified atrial fibrillation: Principal | ICD-10-CM | POA: Diagnosis present

## 2023-07-02 DIAGNOSIS — J9611 Chronic respiratory failure with hypoxia: Secondary | ICD-10-CM | POA: Diagnosis present

## 2023-07-02 DIAGNOSIS — I2510 Atherosclerotic heart disease of native coronary artery without angina pectoris: Secondary | ICD-10-CM | POA: Diagnosis present

## 2023-07-02 DIAGNOSIS — G4733 Obstructive sleep apnea (adult) (pediatric): Secondary | ICD-10-CM | POA: Diagnosis present

## 2023-07-02 DIAGNOSIS — R0989 Other specified symptoms and signs involving the circulatory and respiratory systems: Secondary | ICD-10-CM | POA: Diagnosis not present

## 2023-07-02 DIAGNOSIS — R000 Tachycardia, unspecified: Secondary | ICD-10-CM | POA: Diagnosis present

## 2023-07-02 DIAGNOSIS — I5042 Chronic combined systolic (congestive) and diastolic (congestive) heart failure: Secondary | ICD-10-CM | POA: Diagnosis present

## 2023-07-02 DIAGNOSIS — N1832 Chronic kidney disease, stage 3b: Secondary | ICD-10-CM | POA: Diagnosis present

## 2023-07-02 DIAGNOSIS — N138 Other obstructive and reflux uropathy: Secondary | ICD-10-CM | POA: Diagnosis present

## 2023-07-02 DIAGNOSIS — I5022 Chronic systolic (congestive) heart failure: Secondary | ICD-10-CM | POA: Diagnosis not present

## 2023-07-02 DIAGNOSIS — I129 Hypertensive chronic kidney disease with stage 1 through stage 4 chronic kidney disease, or unspecified chronic kidney disease: Secondary | ICD-10-CM | POA: Diagnosis not present

## 2023-07-02 DIAGNOSIS — Z803 Family history of malignant neoplasm of breast: Secondary | ICD-10-CM

## 2023-07-02 DIAGNOSIS — R0602 Shortness of breath: Secondary | ICD-10-CM | POA: Diagnosis not present

## 2023-07-02 DIAGNOSIS — Z9359 Other cystostomy status: Secondary | ICD-10-CM | POA: Diagnosis not present

## 2023-07-02 DIAGNOSIS — E1122 Type 2 diabetes mellitus with diabetic chronic kidney disease: Secondary | ICD-10-CM

## 2023-07-02 DIAGNOSIS — I2724 Chronic thromboembolic pulmonary hypertension: Secondary | ICD-10-CM | POA: Diagnosis present

## 2023-07-02 DIAGNOSIS — B964 Proteus (mirabilis) (morganii) as the cause of diseases classified elsewhere: Secondary | ICD-10-CM | POA: Diagnosis present

## 2023-07-02 DIAGNOSIS — N401 Enlarged prostate with lower urinary tract symptoms: Secondary | ICD-10-CM | POA: Diagnosis present

## 2023-07-02 DIAGNOSIS — Z7901 Long term (current) use of anticoagulants: Secondary | ICD-10-CM | POA: Diagnosis not present

## 2023-07-02 DIAGNOSIS — E66813 Obesity, class 3: Secondary | ICD-10-CM | POA: Diagnosis present

## 2023-07-02 DIAGNOSIS — I10 Essential (primary) hypertension: Secondary | ICD-10-CM | POA: Diagnosis present

## 2023-07-02 DIAGNOSIS — Z7984 Long term (current) use of oral hypoglycemic drugs: Secondary | ICD-10-CM

## 2023-07-02 DIAGNOSIS — I2699 Other pulmonary embolism without acute cor pulmonale: Secondary | ICD-10-CM

## 2023-07-02 DIAGNOSIS — T45516A Underdosing of anticoagulants, initial encounter: Secondary | ICD-10-CM | POA: Diagnosis present

## 2023-07-02 DIAGNOSIS — N189 Chronic kidney disease, unspecified: Secondary | ICD-10-CM | POA: Diagnosis not present

## 2023-07-02 DIAGNOSIS — Z888 Allergy status to other drugs, medicaments and biological substances status: Secondary | ICD-10-CM

## 2023-07-02 DIAGNOSIS — R339 Retention of urine, unspecified: Secondary | ICD-10-CM | POA: Diagnosis present

## 2023-07-02 DIAGNOSIS — T83511S Infection and inflammatory reaction due to indwelling urethral catheter, sequela: Secondary | ICD-10-CM

## 2023-07-02 DIAGNOSIS — K5900 Constipation, unspecified: Secondary | ICD-10-CM | POA: Diagnosis present

## 2023-07-02 DIAGNOSIS — K219 Gastro-esophageal reflux disease without esophagitis: Secondary | ICD-10-CM | POA: Diagnosis present

## 2023-07-02 DIAGNOSIS — N179 Acute kidney failure, unspecified: Secondary | ICD-10-CM | POA: Diagnosis present

## 2023-07-02 DIAGNOSIS — I480 Paroxysmal atrial fibrillation: Secondary | ICD-10-CM | POA: Diagnosis not present

## 2023-07-02 DIAGNOSIS — I428 Other cardiomyopathies: Secondary | ICD-10-CM | POA: Diagnosis present

## 2023-07-02 DIAGNOSIS — Z6841 Body Mass Index (BMI) 40.0 and over, adult: Secondary | ICD-10-CM

## 2023-07-02 DIAGNOSIS — Z7985 Long-term (current) use of injectable non-insulin antidiabetic drugs: Secondary | ICD-10-CM

## 2023-07-02 DIAGNOSIS — Z79899 Other long term (current) drug therapy: Secondary | ICD-10-CM | POA: Diagnosis not present

## 2023-07-02 DIAGNOSIS — Z7982 Long term (current) use of aspirin: Secondary | ICD-10-CM

## 2023-07-02 DIAGNOSIS — Z91138 Patient's unintentional underdosing of medication regimen for other reason: Secondary | ICD-10-CM

## 2023-07-02 DIAGNOSIS — G4730 Sleep apnea, unspecified: Secondary | ICD-10-CM | POA: Diagnosis present

## 2023-07-02 DIAGNOSIS — Z86711 Personal history of pulmonary embolism: Secondary | ICD-10-CM

## 2023-07-02 DIAGNOSIS — E785 Hyperlipidemia, unspecified: Secondary | ICD-10-CM | POA: Diagnosis present

## 2023-07-02 DIAGNOSIS — Z8042 Family history of malignant neoplasm of prostate: Secondary | ICD-10-CM

## 2023-07-02 DIAGNOSIS — Z96651 Presence of right artificial knee joint: Secondary | ICD-10-CM | POA: Diagnosis present

## 2023-07-02 DIAGNOSIS — Z86718 Personal history of other venous thrombosis and embolism: Secondary | ICD-10-CM | POA: Diagnosis not present

## 2023-07-02 DIAGNOSIS — N390 Urinary tract infection, site not specified: Secondary | ICD-10-CM

## 2023-07-02 DIAGNOSIS — I130 Hypertensive heart and chronic kidney disease with heart failure and stage 1 through stage 4 chronic kidney disease, or unspecified chronic kidney disease: Secondary | ICD-10-CM | POA: Diagnosis present

## 2023-07-02 DIAGNOSIS — E1140 Type 2 diabetes mellitus with diabetic neuropathy, unspecified: Secondary | ICD-10-CM | POA: Diagnosis present

## 2023-07-02 DIAGNOSIS — Z806 Family history of leukemia: Secondary | ICD-10-CM

## 2023-07-02 DIAGNOSIS — Z801 Family history of malignant neoplasm of trachea, bronchus and lung: Secondary | ICD-10-CM

## 2023-07-02 DIAGNOSIS — R338 Other retention of urine: Secondary | ICD-10-CM | POA: Diagnosis present

## 2023-07-02 DIAGNOSIS — N400 Enlarged prostate without lower urinary tract symptoms: Secondary | ICD-10-CM | POA: Diagnosis present

## 2023-07-02 DIAGNOSIS — Z8546 Personal history of malignant neoplasm of prostate: Secondary | ICD-10-CM

## 2023-07-02 LAB — CBC
HCT: 46.5 % (ref 39.0–52.0)
Hemoglobin: 14.8 g/dL (ref 13.0–17.0)
MCH: 28.9 pg (ref 26.0–34.0)
MCHC: 31.8 g/dL (ref 30.0–36.0)
MCV: 90.8 fL (ref 80.0–100.0)
Platelets: 300 10*3/uL (ref 150–400)
RBC: 5.12 MIL/uL (ref 4.22–5.81)
RDW: 13.9 % (ref 11.5–15.5)
WBC: 10.8 10*3/uL — ABNORMAL HIGH (ref 4.0–10.5)
nRBC: 0 % (ref 0.0–0.2)

## 2023-07-02 LAB — BASIC METABOLIC PANEL WITH GFR
Anion gap: 14 (ref 5–15)
BUN: 27 mg/dL — ABNORMAL HIGH (ref 8–23)
CO2: 20 mmol/L — ABNORMAL LOW (ref 22–32)
Calcium: 9.1 mg/dL (ref 8.9–10.3)
Chloride: 107 mmol/L (ref 98–111)
Creatinine, Ser: 1.95 mg/dL — ABNORMAL HIGH (ref 0.61–1.24)
GFR, Estimated: 36 mL/min — ABNORMAL LOW (ref >60)
Glucose, Bld: 156 mg/dL — ABNORMAL HIGH (ref 70–99)
Potassium: 4.1 mmol/L (ref 3.5–5.1)
Sodium: 141 mmol/L (ref 135–145)

## 2023-07-02 LAB — POCT URINALYSIS DIPSTICK (MANUAL)
Nitrite, UA: NEGATIVE
Poct Blood: NEGATIVE
Poct Glucose: 500 mg/dL — AB
Poct Ketones: NEGATIVE
Poct Protein: 30 mg/dL — AB
Poct Urobilinogen: 1 mg/dL — AB
Spec Grav, UA: <=1.005 — AB (ref 1.010–1.025)
pH, UA: 8 (ref 5.0–8.0)

## 2023-07-02 LAB — TROPONIN T, HIGH SENSITIVITY: Troponin T High Sensitivity: 26 ng/L — ABNORMAL HIGH (ref <19)

## 2023-07-02 LAB — MAGNESIUM: Magnesium: 2.1 mg/dL (ref 1.7–2.4)

## 2023-07-02 MED ORDER — DILTIAZEM LOAD VIA INFUSION
20.0000 mg | Freq: Once | INTRAVENOUS | Status: AC
  Administered 2023-07-02: 20 mg via INTRAVENOUS
  Filled 2023-07-02: qty 20

## 2023-07-02 MED ORDER — AMOXICILLIN-POT CLAVULANATE 875-125 MG PO TABS
1.0000 | ORAL_TABLET | Freq: Two times a day (BID) | ORAL | 0 refills | Status: AC
Start: 2023-07-02 — End: 2023-07-09
  Filled 2023-07-02: qty 14, 7d supply, fill #0

## 2023-07-02 MED ORDER — DILTIAZEM HCL-DEXTROSE 125-5 MG/125ML-% IV SOLN (PREMIX)
5.0000 mg/h | INTRAVENOUS | Status: DC
  Administered 2023-07-02: 5 mg/h via INTRAVENOUS
  Filled 2023-07-02: qty 125

## 2023-07-02 NOTE — Telephone Encounter (Signed)
   Chief Complaint: UTI Symptoms: smell, sediments, cloudy  Disposition: [] ED /[] Urgent Care (no appt availability in office) / [x] Appointment(In office/virtual)/ []  [facility]/ [] Home Care/ [] Refused Recommended Disposition /[] [facility]/ []  Follow-up with PCP Additional Notes: Pt wife Felipa Horsfall calling on behalf of pt. Pt has noticed strong smell, cloudy and sediment filled urine in catheter. Pt stated the symptoms started 4 days. Pt stated he has some mild back pain on left side.  Pt unsure if fever is present. Pt last emptied 1400cc out of bag. Pt has appt today at 1400 with NP Medina-Vargas. RN gave care advice and Felipa Horsfall verbalized understanding.            Copied from CRM [id number]. Topic: Clinical - Red Word Triage >> Jul 02, 2023 11:33 AM Adrianna P wrote: Red Word that prompted transfer to Nurse Triage: Pt has a suprapubic catheter and thinks he has a uti, cloudy urine, strong odor, pain when urinating Reason for Disposition  New-onset MILD-MODERATE lower abdominal pain or swelling (distention)  Answer Assessment - Initial Assessment Questions 1. SYMPTOMS: "What symptoms are you concerned about?"     Cloudy sediment, pain , odor 2. ONSET:  "When did the symptoms start?"     Over a week  3. FEVER: "Is there a fever?" If Yes, ask: "What is the temperature, how was it measured, and when did it start?"     Not sure  4. ABDOMEN PAIN: "Is there any abdomen pain?" (e.g., Scale 1-10; or mild, moderate, severe)     Denies  5. URINE COLOR: "What color is the urine?"  "Is there blood present in the urine?" (e.g., clear, yellow, cloudy, tea-colored, blood streaks, bright red)     Cloudy  6. URINE AMOUNT: "When did you last empty the urine from the collection bag?" "How much urine was in the bag at that time?" How much urine is in the collection bag now?"     1400cc 7. INSERTION: "How long have you (they) had the catheter?"     2 years  8. OTHER SYMPTOMS: "Are  there any other symptoms?" (e.g., abdomen swelling, back pain, bladder spasms, constipation, foul smelling urine, leaking of urine)      Smell, pain while urinating, back pain left side 9. MEDICINES: "Are you taking any medicines to treat urinary problems?" (e.g., antibiotics for a urinary tract infection, medicines to treat bladder spasms)      Na  Protocols used: Urinary Catheter (e.g., Foley) Symptoms and Questions-A-AH

## 2023-07-02 NOTE — ED Provider Notes (Signed)
 [place] EMERGENCY DEPARTMENT AT [facility]  Provider Note  CSN: [id number] Arrival date & time: 07/02/23 2252  History Chief Complaint  Patient presents with   Tachycardia    Thomas [name] is a 73 y.o. male with history of pAF, DM, prostate cancer with chronic suprapubic cath here for rapid heart rate started a short time ago at home, noted on home pulse-ox monitor. He denies any CP, cannot feel his heart racing. He has chronic SOB, supposed to be on 3L [place] at all times but sometimes goes without. He was seen by PCP earlier in the day for cloudy urine and given Rx for Augmentin . He has not had his Xarelto  in about a week but reports compliance with his carvedilol . Denies fever. As far as the patient is aware, he has only ever had one prior episode of afib which required cardioversion.    Home Medications Prior to Admission medications   Medication Sig Start Date End Date Taking? Authorizing Provider  Accu-Chek Softclix Lancets lancets Use to test blood sugar daily 06/10/22   Verma Gobble, NP  acetaminophen  (TYLENOL ) 500 MG tablet Take by mouth. 09/28/19   [provider]  Alcohol  Swabs (B-D SINGLE USE SWABS REGULAR) PADS Use in testing blood sugar daily. Dx: E11.22 05/20/21   Verma Gobble, NP  amLODipine  (NORVASC ) 5 MG tablet Take 1 tablet (5 mg total) by mouth daily. 05/24/23   [name], Jessica K, NP  amoxicillin -clavulanate (AUGMENTIN ) 875-125 MG tablet Take 1 tablet by mouth 2 (two) times daily for 7 days. 07/02/23 07/09/23  Medina-Vargas, Monina C, NP  aspirin  EC 81 MG tablet Take 81 mg by mouth daily.    [provider]  atorvastatin  (LIPITOR) 40 MG tablet TAKE 1 TABLET EVERY DAY Patient taking differently: Take 40 mg by mouth daily. Patient is taking daily in the evening 12/14/22   Verma Gobble, NP  Blood Glucose Calibration (ACCU-CHEK AVIVA) SOLN Use once daily as directed dx E11.22 08/25/19   [name], Jessica K, NP  Blood Glucose  Monitoring Suppl (TRUE METRIX AIR GLUCOSE METER) w/Device KIT 1 Device by Does not apply route daily as needed. E11.22 05/20/21   [name], Jessica K, NP  carvedilol  (COREG ) 12.5 MG tablet TAKE 1 TABLET TWICE DAILY WITH MEALS 02/15/23   [name], Jessica K, NP  Cholecalciferol  (VITAMIN D3) 50 MCG (2000 UT) TABS Take 1 tablet (2,000 Units) by mouth daily. 01/19/22     empagliflozin  (JARDIANCE ) 10 MG TABS tablet Take 1 tablet (10 mg total) by mouth daily. 01/20/23   Verma Gobble, NP  furosemide  (LASIX ) 20 MG tablet TAKE 1 TABLET AS DIRECTED AS NEEDED 12/14/22   Marlyse Single T, PA-C  glucose blood (ACCU-CHEK AVIVA PLUS) test strip Use to test blood sugar daily. 03/24/22   Verma Gobble, NP  hydrALAZINE  (APRESOLINE ) 25 MG tablet TAKE 3 TABLETS THREE TIMES DAILY 12/02/22   [name], Jessica K, NP  Lancets Misc. (ACCU-CHEK SOFTCLIX LANCET DEV) KIT Use to test blood sugar daily 03/18/21   [name], Jessica K, NP  losartan  (COZAAR ) 50 MG tablet TAKE 1 TABLET EVERY DAY 12/31/22   [name], Jessica K, NP  metFORMIN  (GLUCOPHAGE ) 500 MG tablet TAKE 1 TABLET TWICE DAILY WITH MEALS 02/15/23   [name], Jessica K, NP  potassium chloride  SA (KLOR-CON  M) 20 MEQ tablet TAKE 1 TABLET TWICE DAILY 01/07/23   [name], Jessica K, NP  rivaroxaban  (XARELTO ) 20 MG TABS tablet Take 1 tablet (20 mg total) by mouth daily with  supper. 08/18/21   [name], Jessica K, NP  Senna 8.7 MG CHEW Chew 1 tablet by mouth as needed. 05/20/21   [name], Jessica K, NP  spironolactone  (ALDACTONE ) 25 MG tablet TAKE 1 TABLET EVERY DAY 06/10/23   Lucendia Rusk, MD  spironolactone  (ALDACTONE ) 50 MG tablet Take 50 mg by mouth daily. 07/07/22   Lucendia Rusk, MD  tirzepatide  (MOUNJARO ) 15 MG/0.5ML Pen Inject 15 mg into the skin once a week. 04/09/23   Jann Melody, MD  traMADol  (ULTRAM ) 50 MG tablet Take 1 tablet (50 mg total) by mouth every 6 (six) hours as needed for pain 03/02/23   [name], Jessica K, NP  TRUEplus Lancets 28G MISC  TEST BLOOD SUGAR EVERY DAY AS NEEDED 01/06/23   Verma Gobble, NP  zinc  gluconate 50 MG tablet Take 1 tablet (50 mg total) by mouth daily. 11/18/21   Ngetich, Dinah C, NP     Allergies    Lisinopril    Review of Systems   Review of Systems Please see HPI for pertinent positives and negatives  Physical Exam BP 133/87   Pulse 69   Temp 98.1 F (36.7 C)   Resp 18   SpO2 94%   Physical Exam Vitals and nursing note reviewed.  Constitutional:      Appearance: Normal appearance.  HENT:     Head: Normocephalic and atraumatic.     Nose: Nose normal.     Mouth/Throat:     Mouth: Mucous membranes are moist.  Eyes:     Extraocular Movements: Extraocular movements intact.     Conjunctiva/sclera: Conjunctivae normal.  Cardiovascular:     Rate and Rhythm: Tachycardia present. Rhythm irregular.  Pulmonary:     Effort: Pulmonary effort is normal.     Breath sounds: Normal breath sounds.  Abdominal:     General: Abdomen is flat.     Palpations: Abdomen is soft.     Tenderness: There is no abdominal tenderness.  Musculoskeletal:        General: No swelling. Normal range of motion.     Cervical back: Neck supple.     Right lower leg: No edema.     Left lower leg: No edema.  Skin:    General: Skin is warm and dry.  Neurological:     General: No focal deficit present.     Mental Status: He is alert.  Psychiatric:        Mood and Affect: Mood normal.     ED Results / Procedures / Treatments   EKG EKG Interpretation Date/Time:  Friday Jul 02 2023 23:01:18 EDT Ventricular Rate:  139 PR Interval:    QRS Duration:  90 QT Interval:  328 QTC Calculation: 499 R Axis:   14  Text Interpretation: Atrial fibrillation with rapid ventricular response Borderline prolonged QT interval Since last tracing Atrial fibrillation has replaced Sinus rhythm Confirmed by Shawnee Dellen [phone]) on 07/02/2023 11:13:14 PM  Procedures .Critical Care  Performed by: Charmayne Cooper,  MD Authorized by: Charmayne Cooper, MD   Critical care provider statement:    Critical care time (minutes):  60   Critical care time was exclusive of:  Separately billable procedures and treating other patients   Critical care was necessary to treat or prevent imminent or life-threatening deterioration of the following conditions:  Cardiac failure   Care discussed with: admitting provider     Medications Ordered in the ED Medications  diltiazem  (CARDIZEM ) 1 mg/mL load via infusion 20 mg (  20 mg Intravenous Bolus from Bag 07/02/23 2319)    And  diltiazem  (CARDIZEM ) 125 mg in dextrose  5% 125 mL (1 mg/mL) infusion (5 mg/hr Intravenous New Bag/Given 07/02/23 2322)    Initial Impression and Plan  Patient here with rapid afib, relatively asymptomatic so exact time of onset difficult to determine and having missed about a week of Xarelto , not a candidate for ED cardioversion. Will check labs, CXR and begin cardizem  for rate control.   ED Course   Clinical Course as of 07/03/23 0315  Fri Jul 02, 2023  2357 CBC is unremarkable. BMP with elevated Cr, not significantly changed from baseline. Initial Trop is mildly elevated. HR improved but remains in afib.  [CS]  Sat Jul 03, 2023  0100 I personally viewed the images from radiology studies and agree with radiologist interpretation: CXR with vascular congestion but no overt edema. Patient does not appear to be clinically fluid overloaded.  [CS]  0157 Repeat Trop is flat. HR remains well controlled but still in afib on cardizem  drip. Discussed admission with patient and family at bedside and they are amenable. Hospitalist paged.  [CS]  0309 Spoke with Dr. Michell Ahumada, who will accept for admission.  [CS]    Clinical Course User Index [CS] Charmayne Cooper, MD     MDM Rules/Calculators/A&P Medical Decision Making Problems Addressed: Atrial fibrillation, rapid [facility]): acute illness or injury that poses a threat to life or bodily functions Chronic  kidney disease, unspecified CKD stage: chronic illness or injury  Amount and/or Complexity of Data Reviewed Labs: ordered. Decision-making details documented in ED Course. Radiology: ordered and independent interpretation performed. Decision-making details documented in ED Course. ECG/medicine tests: ordered and independent interpretation performed. Decision-making details documented in ED Course.  Risk Prescription drug management. Decision regarding hospitalization.     Final Clinical Impression(s) / ED Diagnoses Final diagnoses:  Atrial fibrillation, rapid (HCC)  Chronic kidney disease, unspecified CKD stage    Rx / DC Orders ED Discharge Orders          Ordered    Amb referral to AFIB Clinic        07/02/23 2310             Charmayne Cooper, MD 07/03/23 [phone]

## 2023-07-02 NOTE — ED Triage Notes (Signed)
 Pt reports he is here today due to tachycardia. Pt reports he felt his heart racing earlier today. Pt reports mild sob.Pt reports he stop taking his blood thinner because he ran out of it.Pt denies any cp at this time

## 2023-07-02 NOTE — Progress Notes (Signed)
 [facility] clinic  Provider:  Inge Mangle DNP  Code Status:  Full Code  Goals of Care:     05/24/2023    9:58 AM  Advanced Directives  Does Patient Have a Medical Advance Directive? Yes  Type of Estate agent of [place];Living will  Does patient want to make changes to medical advance directive? No - Patient declined  Copy of Healthcare Power of Attorney in Chart? No - copy requested     Chief Complaint  Patient presents with   Urinary Tract Infection   Discussed the use of AI scribe software for clinical note transcription with the patient, who gave verbal consent to proceed.  HPI: Patient is a 73 y.o. male seen today for an acute visit for possible UTI. He was accompanied by his wife.  He has a chronic suprapubic catheter, last changed on June 06, 2023. Since the catheter change, his urine has been cloudy with a burning sensation in the penile area, particularly when he feels the urge to urinate. He describes the urine as 'milky' in appearance, which is a new occurrence for him. No fever or chills, but he notes occasional left-sided back pain that comes and goes.  He has a history of prostate cancer treated with radiated seeds, which led to necrosis in the bladder and urethra area, necessitating the use of a suprapubic catheter for the past three years. This is the first time he has experienced such symptoms since having the catheter.  He has a history of atrial fibrillation, for which he takes Xarelto  20 mg daily and carvedilol  12.5 mg twice a day. He also has type 2 diabetes, well-controlled with an A1c of 5.5, managed with metformin  500 mg twice a day, Jardiance  10 mg daily, and Mounjaro  15 mg weekly. He has hypertension, managed with losartan  50 mg daily and hydralazine  75 mg three times a day. He has a history of chronic kidney disease with a creatinine level of 1.55 and a GFR of 48.    Past Medical History:  Diagnosis Date   Acute on chronic  diastolic CHF (congestive heart failure) (HCC) 06/01/2017   Arthritis    Cancer (HCC) 2010   Prostate   Chronic kidney disease    Diabetes mellitus    Diabetic neuropathy (HCC)    feet   DVT (deep venous thrombosis) (HCC)    Genetic testing 06/22/2016   Mr. Papendick underwent genetic counseling and testing for hereditary cancer syndromes on 05/14/2016. His results were negative for mutations in all 46 genes analyzed by [facility]'s 46-gene Common Hereditary Cancers Panel. Genes analyzed include: APC, ATM, AXIN2, BARD1, BMPR1A, BRCA1, BRCA2, BRIP1, CDH1, CDKN2A, CHEK2, CTNNA1, DICER1, EPCAM, GREM1, HOXB13, KIT, MEN1, MLH1, MSH2, MSH3, MSH6, MUTYH, NB   GERD (gastroesophageal reflux disease)    Hypertension    PE (pulmonary thromboembolism) (HCC)    Pneumonia    Sleep apnea    not wearing CPAP   Suprapubic catheter (HCC) 11/17/2019    Past Surgical History:  Procedure Laterality Date   CARDIOVERSION N/A 06/03/2017   Procedure: CARDIOVERSION;  Surgeon: Hugh Madura, MD;  Location: [facility] ENDOSCOPY;  Service: Cardiovascular;  Laterality: N/A;   CATARACT EXTRACTION Left 07/31/2021   Dr.Glenn, [facility]   COLONOSCOPY     COLONOSCOPY WITH PROPOFOL  N/A 10/20/2018   Procedure: COLONOSCOPY WITH PROPOFOL ;  Surgeon: Lindle Rhea, MD;  Location: [facility] ENDOSCOPY;  Service: Gastroenterology;  Laterality: N/A;   HERNIA REPAIR     KNEE SURGERY  MULTIPLE TOOTH EXTRACTIONS     POLYPECTOMY  10/20/2018   Procedure: POLYPECTOMY;  Surgeon: Lindle Rhea, MD;  Location: [facility] ENDOSCOPY;  Service: Gastroenterology;;   PROSTATE SURGERY     RADIOACTIVE SEED IMPLANT     RIGHT/LEFT HEART CATH AND CORONARY ANGIOGRAPHY N/A 07/06/2017   Procedure: RIGHT/LEFT HEART CATH AND CORONARY ANGIOGRAPHY;  Surgeon: Lucendia Rusk, MD;  Location: [facility] INVASIVE CV LAB;  Service: Cardiovascular;  Laterality: N/A;   SHOULDER SURGERY     TOTAL KNEE ARTHROPLASTY Right 11/19/2016   Procedure: RIGHT TOTAL KNEE  ARTHROPLASTY;  Surgeon: Wes Hamman, MD;  Location: [facility] OR;  Service: Orthopedics;  Laterality: Right;   uretha surgery-2014      Allergies  Allergen Reactions   Lisinopril  Cough and Other (See Comments)    Muscle pain    Outpatient Encounter Medications as of 07/02/2023  Medication Sig   Accu-Chek Softclix Lancets lancets Use to test blood sugar daily   acetaminophen  (TYLENOL ) 500 MG tablet Take by mouth.   Alcohol  Swabs (B-D SINGLE USE SWABS REGULAR) PADS Use in testing blood sugar daily. Dx: E11.22   amLODipine  (NORVASC ) 5 MG tablet Take 1 tablet (5 mg total) by mouth daily.   aspirin  EC 81 MG tablet Take 81 mg by mouth daily.   atorvastatin  (LIPITOR) 40 MG tablet TAKE 1 TABLET EVERY DAY (Patient taking differently: Take 40 mg by mouth daily. Patient is taking daily in the evening)   Blood Glucose Calibration (ACCU-CHEK AVIVA) SOLN Use once daily as directed dx E11.22   Blood Glucose Monitoring Suppl (TRUE METRIX AIR GLUCOSE METER) w/Device KIT 1 Device by Does not apply route daily as needed. E11.22   carvedilol  (COREG ) 12.5 MG tablet TAKE 1 TABLET TWICE DAILY WITH MEALS   Cholecalciferol  (VITAMIN D3) 50 MCG (2000 UT) TABS Take 1 tablet (2,000 Units) by mouth daily.   empagliflozin  (JARDIANCE ) 10 MG TABS tablet Take 1 tablet (10 mg total) by mouth daily.   furosemide  (LASIX ) 20 MG tablet TAKE 1 TABLET AS DIRECTED AS NEEDED   glucose blood (ACCU-CHEK AVIVA PLUS) test strip Use to test blood sugar daily.   hydrALAZINE  (APRESOLINE ) 25 MG tablet TAKE 3 TABLETS THREE TIMES DAILY   Lancets Misc. (ACCU-CHEK SOFTCLIX LANCET DEV) KIT Use to test blood sugar daily   losartan  (COZAAR ) 50 MG tablet TAKE 1 TABLET EVERY DAY   metFORMIN  (GLUCOPHAGE ) 500 MG tablet TAKE 1 TABLET TWICE DAILY WITH MEALS   potassium chloride  SA (KLOR-CON  M) 20 MEQ tablet TAKE 1 TABLET TWICE DAILY   rivaroxaban  (XARELTO ) 20 MG TABS tablet Take 1 tablet (20 mg total) by mouth daily with supper.   Senna 8.7 MG CHEW Chew  1 tablet by mouth as needed.   spironolactone  (ALDACTONE ) 25 MG tablet TAKE 1 TABLET EVERY DAY   spironolactone  (ALDACTONE ) 50 MG tablet Take 50 mg by mouth daily.   tirzepatide  (MOUNJARO ) 15 MG/0.5ML Pen Inject 15 mg into the skin once a week.   traMADol  (ULTRAM ) 50 MG tablet Take 1 tablet (50 mg total) by mouth every 6 (six) hours as needed for pain   TRUEplus Lancets 28G MISC TEST BLOOD SUGAR EVERY DAY AS NEEDED   zinc  gluconate 50 MG tablet Take 1 tablet (50 mg total) by mouth daily.   No facility-administered encounter medications on file as of 07/02/2023.    Review of Systems:  Review of Systems  Constitutional:  Negative for activity change, appetite change and fever.  HENT:  Negative for sore throat.  Eyes: Negative.   Cardiovascular:  Negative for chest pain and leg swelling.  Gastrointestinal:  Negative for abdominal distention, diarrhea and vomiting.  Genitourinary:  Positive for dysuria and genital sores. Negative for frequency and urgency.  Musculoskeletal:  Positive for back pain.       Occasional back pains   Skin:  Negative for color change.  Neurological:  Negative for dizziness and headaches.  Psychiatric/Behavioral:  Negative for behavioral problems and sleep disturbance. The patient is not nervous/anxious.     Health Maintenance  Topic Date Due   DTaP/Tdap/Td (1 - Tdap) Never done   Zoster Vaccines- Shingrix (1 of 2) Never done   COVID-19 Vaccine (5 - 2024-25 season) 10/18/2022   Diabetic kidney evaluation - Urine ACR  06/29/2023   INFLUENZA VACCINE  09/17/2023   Medicare Annual Wellness (AWV)  11/13/2023   HEMOGLOBIN A1C  11/23/2023   Diabetic kidney evaluation - eGFR measurement  05/23/2024   FOOT EXAM  05/23/2024   OPHTHALMOLOGY EXAM  05/30/2024   Colonoscopy  10/19/2028   Pneumonia Vaccine 92+ Years old  Completed   Hepatitis C Screening  Completed   HPV VACCINES  Aged Out   Meningococcal B Vaccine  Aged Out    Physical Exam: Vitals:   07/02/23  1255  BP: 120/80  Pulse: 78  Temp: 98 F (36.7 C)  Weight: (!) 303 lb (137.4 kg)   Body mass index is 44.75 kg/m. Physical Exam Constitutional:      Appearance: Normal appearance.  HENT:     Head: Normocephalic and atraumatic.     Mouth/Throat:     Mouth: Mucous membranes are moist.  Eyes:     Conjunctiva/sclera: Conjunctivae normal.  Cardiovascular:     Rate and Rhythm: Normal rate. Rhythm irregular.     Pulses: Normal pulses.     Heart sounds: Normal heart sounds.  Pulmonary:     Effort: Pulmonary effort is normal.     Breath sounds: Normal breath sounds.  Abdominal:     General: Bowel sounds are normal.     Palpations: Abdomen is soft.  Genitourinary:    Comments: Has suprapubic catheter draining to urine bag Musculoskeletal:        General: No swelling. Normal range of motion.     Cervical back: Normal range of motion.  Skin:    General: Skin is warm and dry.  Neurological:     General: No focal deficit present.     Mental Status: He is alert and oriented to person, place, and time.  Psychiatric:        Mood and Affect: Mood normal.        Behavior: Behavior normal.        Thought Content: Thought content normal.        Judgment: Judgment normal.     Labs reviewed: Basic Metabolic Panel: Recent Labs    07/14/22 0926 11/03/22 1000 05/24/23 1034  NA 140 142 141  K 3.9 4.9 5.2  CL 103 106 106  CO2 20 21 25   GLUCOSE 124* 102* 131*  BUN 24 16 28*  CREATININE 1.34* 1.30* 1.55*  CALCIUM  8.9 9.9 9.2   Liver Function Tests: Recent Labs    07/03/22 1120 11/03/22 1000 05/24/23 1034  AST 26 21 12   ALT 44 36 14  ALKPHOS 104 94  --   BILITOT 0.8 0.6 0.6  PROT 7.6 7.3 7.4  ALBUMIN 4.3 4.2  --    No results for input(s): "LIPASE", "AMYLASE" in  the last 8760 hours. No results for input(s): "AMMONIA" in the last 8760 hours. CBC: Recent Labs    11/03/22 1000 05/24/23 1034  WBC 8.6 9.7  NEUTROABS  --  7,430  HGB 14.0 14.2  HCT 43.2 44.2  MCV 88  88.9  PLT 269 281   Lipid Panel: Recent Labs    11/03/22 1000  CHOL 109  HDL 46  LDLCALC 48  TRIG 75  CHOLHDL 2.4   Lab Results  Component Value Date   HGBA1C 5.5 05/24/2023    Procedures since last visit: No results found.  Assessment/Plan  1. Urinary tract infection associated with catheterization of urinary tract, unspecified indwelling urinary catheter type, sequela (Primary) -  Suspected UTI due to chronic suprapubic catheter use. Initial urine dipstick indicates infection. - Prescribed amoxicillin -clavulanate for 7 days. - Sent urine sample for culture and sensitivity. - Adjust antibiotic based on culture results if necessary. - POCT Urinalysis Dip Manual -negative blood, moderate leukocytes, negative nitrate - Urine Culture - amoxicillin -clavulanate (AUGMENTIN ) 875-125 MG tablet; Take 1 tablet by mouth 2 (two) times daily for 7 days.  Dispense: 14 tablet; Refill: 0  2. Paroxysmal atrial fibrillation (HCC) -  Chronic atrial fibrillation managed with Xarelto  and carvedilol .  3. Type 2 diabetes mellitus with chronic kidney disease, without long-term current use of insulin , unspecified CKD stage (HCC) -  Well-controlled with A1c of 5.5, managed with metformin , Jardiance , and Mounjaro .  4. Hypertension with chronic kidney disease -  Well-controlled with current medications. BP 120/80.        Labs/tests ordered:  POC urine dipstick and urine culture   No follow-ups on file.  Achol Azpeitia Medina-Vargas, NP

## 2023-07-02 NOTE — Telephone Encounter (Signed)
Message routed to PCP [name], Jessica K, NP  

## 2023-07-03 DIAGNOSIS — Z7901 Long term (current) use of anticoagulants: Secondary | ICD-10-CM | POA: Diagnosis not present

## 2023-07-03 DIAGNOSIS — I4891 Unspecified atrial fibrillation: Secondary | ICD-10-CM | POA: Diagnosis present

## 2023-07-03 DIAGNOSIS — J9611 Chronic respiratory failure with hypoxia: Secondary | ICD-10-CM | POA: Diagnosis not present

## 2023-07-03 DIAGNOSIS — E66813 Obesity, class 3: Secondary | ICD-10-CM | POA: Diagnosis not present

## 2023-07-03 DIAGNOSIS — E1122 Type 2 diabetes mellitus with diabetic chronic kidney disease: Secondary | ICD-10-CM | POA: Diagnosis not present

## 2023-07-03 DIAGNOSIS — Z86718 Personal history of other venous thrombosis and embolism: Secondary | ICD-10-CM | POA: Diagnosis not present

## 2023-07-03 DIAGNOSIS — Z86711 Personal history of pulmonary embolism: Secondary | ICD-10-CM | POA: Diagnosis not present

## 2023-07-03 DIAGNOSIS — E785 Hyperlipidemia, unspecified: Secondary | ICD-10-CM | POA: Diagnosis not present

## 2023-07-03 DIAGNOSIS — I2510 Atherosclerotic heart disease of native coronary artery without angina pectoris: Secondary | ICD-10-CM | POA: Diagnosis not present

## 2023-07-03 DIAGNOSIS — R339 Retention of urine, unspecified: Secondary | ICD-10-CM | POA: Diagnosis not present

## 2023-07-03 DIAGNOSIS — N179 Acute kidney failure, unspecified: Secondary | ICD-10-CM

## 2023-07-03 DIAGNOSIS — Z6841 Body Mass Index (BMI) 40.0 and over, adult: Secondary | ICD-10-CM | POA: Diagnosis not present

## 2023-07-03 DIAGNOSIS — I130 Hypertensive heart and chronic kidney disease with heart failure and stage 1 through stage 4 chronic kidney disease, or unspecified chronic kidney disease: Secondary | ICD-10-CM | POA: Diagnosis not present

## 2023-07-03 DIAGNOSIS — N1832 Chronic kidney disease, stage 3b: Secondary | ICD-10-CM | POA: Diagnosis not present

## 2023-07-03 DIAGNOSIS — N390 Urinary tract infection, site not specified: Secondary | ICD-10-CM

## 2023-07-03 DIAGNOSIS — I5022 Chronic systolic (congestive) heart failure: Secondary | ICD-10-CM | POA: Diagnosis not present

## 2023-07-03 DIAGNOSIS — Z7982 Long term (current) use of aspirin: Secondary | ICD-10-CM | POA: Diagnosis not present

## 2023-07-03 DIAGNOSIS — N401 Enlarged prostate with lower urinary tract symptoms: Secondary | ICD-10-CM | POA: Diagnosis not present

## 2023-07-03 DIAGNOSIS — G4733 Obstructive sleep apnea (adult) (pediatric): Secondary | ICD-10-CM | POA: Diagnosis not present

## 2023-07-03 DIAGNOSIS — Z79899 Other long term (current) drug therapy: Secondary | ICD-10-CM | POA: Diagnosis not present

## 2023-07-03 LAB — GLUCOSE, CAPILLARY
Glucose-Capillary: 124 mg/dL — ABNORMAL HIGH (ref 70–99)
Glucose-Capillary: 159 mg/dL — ABNORMAL HIGH (ref 70–99)

## 2023-07-03 LAB — URINALYSIS, ROUTINE W REFLEX MICROSCOPIC
Bilirubin Urine: NEGATIVE
Glucose, UA: 150 mg/dL — AB
Ketones, ur: NEGATIVE mg/dL
Nitrite: NEGATIVE
Protein, ur: NEGATIVE mg/dL
Specific Gravity, Urine: 1.005 (ref 1.005–1.030)
pH: 6 (ref 5.0–8.0)

## 2023-07-03 LAB — CBG MONITORING, ED
Glucose-Capillary: 117 mg/dL — ABNORMAL HIGH (ref 70–99)
Glucose-Capillary: 125 mg/dL — ABNORMAL HIGH (ref 70–99)

## 2023-07-03 LAB — TSH: TSH: 1.058 u[IU]/mL (ref 0.350–4.500)

## 2023-07-03 LAB — BRAIN NATRIURETIC PEPTIDE: B Natriuretic Peptide: 418.8 pg/mL — ABNORMAL HIGH (ref 0.0–100.0)

## 2023-07-03 LAB — PROTIME-INR
INR: 1.1 (ref 0.8–1.2)
Prothrombin Time: 14.2 s (ref 11.4–15.2)

## 2023-07-03 LAB — URINE CULTURE
MICRO NUMBER:: 16466606
SPECIMEN QUALITY:: ADEQUATE

## 2023-07-03 LAB — TROPONIN T, HIGH SENSITIVITY: Troponin T High Sensitivity: 26 ng/L — ABNORMAL HIGH (ref <19)

## 2023-07-03 MED ORDER — CARVEDILOL 12.5 MG PO TABS
12.5000 mg | ORAL_TABLET | Freq: Two times a day (BID) | ORAL | Status: DC
  Administered 2023-07-03 – 2023-07-04 (×2): 12.5 mg via ORAL
  Filled 2023-07-03 (×2): qty 1

## 2023-07-03 MED ORDER — ACETAMINOPHEN 325 MG PO TABS: 650.0000 mg | ORAL_TABLET | Freq: Four times a day (QID) | ORAL | Status: DC | PRN

## 2023-07-03 MED ORDER — ASPIRIN 81 MG PO TBEC
81.0000 mg | DELAYED_RELEASE_TABLET | Freq: Every day | ORAL | Status: DC
  Administered 2023-07-03 – 2023-07-04 (×2): 81 mg via ORAL
  Filled 2023-07-03 (×2): qty 1

## 2023-07-03 MED ORDER — SENNA 8.6 MG PO TABS
1.0000 | ORAL_TABLET | Freq: Every day | ORAL | Status: DC | PRN
  Administered 2023-07-05: 8.6 mg via ORAL
  Filled 2023-07-03: qty 1

## 2023-07-03 MED ORDER — ATORVASTATIN CALCIUM 40 MG PO TABS
40.0000 mg | ORAL_TABLET | Freq: Every day | ORAL | Status: DC
  Administered 2023-07-03 – 2023-07-06 (×4): 40 mg via ORAL
  Filled 2023-07-03 (×4): qty 1

## 2023-07-03 MED ORDER — VITAMIN D 25 MCG (1000 UNIT) PO TABS
2000.0000 [IU] | ORAL_TABLET | Freq: Every day | ORAL | Status: DC
  Administered 2023-07-03 – 2023-07-06 (×4): 2000 [IU] via ORAL
  Filled 2023-07-03 (×4): qty 2

## 2023-07-03 MED ORDER — AMOXICILLIN-POT CLAVULANATE 875-125 MG PO TABS
1.0000 | ORAL_TABLET | Freq: Two times a day (BID) | ORAL | Status: DC
  Administered 2023-07-03 – 2023-07-06 (×6): 1 via ORAL
  Filled 2023-07-03 (×6): qty 1

## 2023-07-03 MED ORDER — EMPAGLIFLOZIN 10 MG PO TABS: 10.0000 mg | ORAL_TABLET | Freq: Every day | ORAL | Status: DC

## 2023-07-03 MED ORDER — ACETAMINOPHEN 650 MG RE SUPP: 650.0000 mg | Freq: Four times a day (QID) | RECTAL | Status: DC | PRN

## 2023-07-03 MED ORDER — TIRZEPATIDE 15 MG/0.5ML ~~LOC~~ SOAJ: 15.0000 mg | SUBCUTANEOUS | Status: DC

## 2023-07-03 MED ORDER — RIVAROXABAN 20 MG PO TABS
20.0000 mg | ORAL_TABLET | Freq: Every day | ORAL | Status: DC
  Administered 2023-07-03 – 2023-07-05 (×3): 20 mg via ORAL
  Filled 2023-07-03 (×3): qty 1

## 2023-07-03 MED ORDER — DILTIAZEM HCL-DEXTROSE 125-5 MG/125ML-% IV SOLN (PREMIX)
5.0000 mg/h | INTRAVENOUS | Status: DC
  Administered 2023-07-03: 5 mg/h via INTRAVENOUS
  Filled 2023-07-03: qty 125

## 2023-07-03 MED ORDER — FUROSEMIDE 10 MG/ML IJ SOLN
40.0000 mg | Freq: Once | INTRAMUSCULAR | Status: AC
  Administered 2023-07-03: 40 mg via INTRAVENOUS
  Filled 2023-07-03: qty 4

## 2023-07-03 MED ORDER — METFORMIN HCL 500 MG PO TABS
500.0000 mg | ORAL_TABLET | Freq: Two times a day (BID) | ORAL | Status: DC
  Administered 2023-07-03: 500 mg via ORAL
  Filled 2023-07-03: qty 1

## 2023-07-03 MED ORDER — TRAMADOL HCL 50 MG PO TABS: 50.0000 mg | ORAL_TABLET | Freq: Four times a day (QID) | ORAL | Status: DC | PRN

## 2023-07-03 MED ORDER — INSULIN ASPART 100 UNIT/ML IJ SOLN
0.0000 [IU] | Freq: Three times a day (TID) | INTRAMUSCULAR | Status: DC
  Administered 2023-07-03 – 2023-07-05 (×4): 1 [IU] via SUBCUTANEOUS
  Administered 2023-07-05: 2 [IU] via SUBCUTANEOUS

## 2023-07-03 MED ORDER — SENNA 8.7 MG PO CHEW: 1.0000 | CHEWABLE_TABLET | ORAL | Status: DC | PRN

## 2023-07-03 MED ORDER — INSULIN ASPART 100 UNIT/ML IJ SOLN: 0.0000 [IU] | Freq: Three times a day (TID) | INTRAMUSCULAR | Status: DC

## 2023-07-03 NOTE — Assessment & Plan Note (Signed)
Suprapubic catheter 

## 2023-07-03 NOTE — ED Notes (Signed)
 Report given to [facility].

## 2023-07-03 NOTE — Assessment & Plan Note (Signed)
 A1C of 5.5 in April of 2025 Hold metformin  and GLP-1 SSI and accuchecks QAC/HS

## 2023-07-03 NOTE — Assessment & Plan Note (Signed)
-  followed by pulmonology  -in setting of obesity, CTEPH, and chronic HFrEF - he is still needing supplemental oxygen  with exertion - goal SpO2 > 90%

## 2023-07-03 NOTE — Assessment & Plan Note (Signed)
 73 year old with history of atrial fibrillation on xarelto  who presented to ED with palpitations and elevated heart rate x 1 day found to be in atrial fibrillation with RVR -obs to tele -started on cardizem  gtt, will transition to oral when parameters met. Give coreg  dose now -magnesium  wnl, check TSH -CHA2DS2-VASc of 3. he has not taking his xarelto  x 1 week due to lots of stressful events in family, started back  -repeat echo -He has multiple contributing co morbidities including PAH, HFmrEF, HTN, stress and possible infection that could be contributing to RVR -x1 dose of IV lasix  40mg  for interstitial edema in lungs, but overall does not appear volume overloaded  -cardiology consulted, follows with Dr. Paulita Boss.

## 2023-07-03 NOTE — Assessment & Plan Note (Signed)
 Seen by PCP yesterday for cloudy urine and started on Augmentin  for presumed UTI off UA Culture not done, I have ordered one here  Stop jardiance  for now  F/u on culture

## 2023-07-03 NOTE — ED Notes (Signed)
 Called Bed Placement, spoke to Mertens. Awaiting dc for pt to get a bed assignment

## 2023-07-03 NOTE — Assessment & Plan Note (Addendum)
 Echo 04/2022: EF of 45-50%, mildly decreased LV function, grade 1 DD. Moderately elevated pulmonary arterial pressure  Appears to have some overload from atrial fib with RVR on CXR, but clinically appears euvolemic  Give 1 dose of IV lasix , 40mg   Follow diuresis Strict I/O and daily weights  Repeat echo pending

## 2023-07-03 NOTE — Plan of Care (Addendum)
 Plan of Care Note for accepted transfer   Patient name: Thomas [name] GNF:[id number] DOB: [birth year]  Facility requesting transfer: [facility] Requesting Provider: Dr. Bolivar Bushman Facility course: 73 year old male with history of chronic HFmrEF, CKD, diabetes, paroxysmal A-fib and history of DVT/PE on Xarelto , GERD, hypertension, sleep apnea, history of prostate cancer, urinary retention and has a chronic suprapubic catheter seen by PCP yesterday for UTI and was prescribed Augmentin .  Patient presented to the ED for elevation of rapid heart rate and shortness of breath.  Noted to be in A-fib with rate in the 140s initially.  Chronically on 3 L [place] at home and no change in oxygen  requirement from baseline.  Patient is taking Coreg  at home but ran out of Xarelto  a week ago.  No fever or leukocytosis, troponin 26> 26, potassium 4.1, magnesium  2.1, creatinine 1.9 (baseline 1.3-1.5).  Chest x-ray showing pulmonary interstitial prominence with vascular congestion. Patient was started on Cardizem  drip and heart rate has now improved but remains in A-fib.  Plan of care: The patient is accepted for admission to Progressive unit at [facility].  [facility] will assume care on arrival to accepting facility. Until arrival, care as per EDP. However, [facility] available 24/7 for questions and assistance.  Check [email or web address] for on-call coverage.  Nursing staff, please call [facility] Admits & Consults System-Wide number under Amion on patient's arrival so appropriate admitting provider can evaluate the pt.

## 2023-07-03 NOTE — Assessment & Plan Note (Signed)
-  Minimal non-obstructive disease by cardiac catheterization in May 2019.  -continue medical management with ASA, statin, beta blocker

## 2023-07-03 NOTE — ED Notes (Signed)
 Pt is resting at this time. Pt expresses no complains. Pt VSS. Pt bed is locked and at lowest position. Rounding performed.

## 2023-07-03 NOTE — Assessment & Plan Note (Signed)
 On hydralazine , losartan , norvasc , lasix  and coreg  Start coreg  now, hold losartan  with AKI on cKD Lasix  to IV Hold hydralazine  for now while on cardizem  gtt Blood pressure stable

## 2023-07-03 NOTE — H&P (Signed)
 History and Physical    Patient: Thomas [name] ZOX:[id number] DOB: 04/03/1950 DOA: 07/02/2023 DOS: the patient was seen and examined on 07/03/2023 PCP: Verma Gobble, NP  Patient coming from: [facility] - lives with his wife. Uses a cane to ambulate    Chief Complaint: palpitations/tachycardia   HPI: JOMO FORAND is a 73 y.o. male with medical history significant of PAF and hx of DVT/PE on xarelto , HFmrEF, T2DM, HTN, OSA, hx of prostate cancer, urinary retention with chronic suprapubic catheter, CAD, pulmonary HTN who presents to ED with complaints of    He went to see his PCP yesterday due to cloudy urine in his bag. He was given a px for augmentin  for a UTI. When he got home he took a nap and when he got up he checked his heart rate/oxygen  with his pulse ox and it showed his heart rate to be 138 and his oxygen  was 92%. He had some palpitations and saw that his heart rate was high and went to the ED. He had no chest pain, shortness of breath, leg swelling.    He has been feeling good. Denies any fever/chills, vision changes/headaches, chest pain, shortness of breath or cough, abdominal pain, N/V/D, or leg swelling.    He does not smoke or drink alcohol .   ER Course:  vitals: afebrile, bp: 126/73, HR: 148, RR: 18, oxygen : 93%RA Pertinent labs: WBC 10.8,BUN: 27, creatinine: 1.95, troponin 26>26,  CXR: findings suggestive of CHF. Pulmonary interstitial prominence with vascular congestion. No focal consolidation. No pneumothorax or pleural effusion identified.  In ED: started on cardizem  gtt and xarelto . [facility] asked to admit.   Review of Systems: As mentioned in the history of present illness. All other systems reviewed and are negative. Past Medical History:  Diagnosis Date   Acute on chronic diastolic CHF (congestive heart failure) (HCC) 06/01/2017   Arthritis    Cancer (HCC) 2010   Prostate   Chronic kidney disease    Diabetes mellitus    Diabetic neuropathy (HCC)    feet    DVT (deep venous thrombosis) (HCC)    Genetic testing 06/22/2016   Mr. Kintz underwent genetic counseling and testing for hereditary cancer syndromes on 05/14/2016. His results were negative for mutations in all 46 genes analyzed by [facility]'s 46-gene Common Hereditary Cancers Panel. Genes analyzed include: APC, ATM, AXIN2, BARD1, BMPR1A, BRCA1, BRCA2, BRIP1, CDH1, CDKN2A, CHEK2, CTNNA1, DICER1, EPCAM, GREM1, HOXB13, KIT, MEN1, MLH1, MSH2, MSH3, MSH6, MUTYH, NB   GERD (gastroesophageal reflux disease)    Hypertension    PE (pulmonary thromboembolism) (HCC)    Pneumonia    Sleep apnea    not wearing CPAP   Suprapubic catheter (HCC) 11/17/2019   Past Surgical History:  Procedure Laterality Date   CARDIOVERSION N/A 06/03/2017   Procedure: CARDIOVERSION;  Surgeon: Hugh Madura, MD;  Location: [facility] ENDOSCOPY;  Service: Cardiovascular;  Laterality: N/A;   CATARACT EXTRACTION Left 07/31/2021   Dr.Glenn, [facility]   COLONOSCOPY     COLONOSCOPY WITH PROPOFOL  N/A 10/20/2018   Procedure: COLONOSCOPY WITH PROPOFOL ;  Surgeon: Lindle Rhea, MD;  Location: [facility] ENDOSCOPY;  Service: Gastroenterology;  Laterality: N/A;   HERNIA REPAIR     KNEE SURGERY     MULTIPLE TOOTH EXTRACTIONS     POLYPECTOMY  10/20/2018   Procedure: POLYPECTOMY;  Surgeon: Lindle Rhea, MD;  Location: [facility] ENDOSCOPY;  Service: Gastroenterology;;   PROSTATE SURGERY     RADIOACTIVE SEED IMPLANT     RIGHT/LEFT HEART  CATH AND CORONARY ANGIOGRAPHY N/A 07/06/2017   Procedure: RIGHT/LEFT HEART CATH AND CORONARY ANGIOGRAPHY;  Surgeon: Lucendia Rusk, MD;  Location: [facility] INVASIVE CV LAB;  Service: Cardiovascular;  Laterality: N/A;   SHOULDER SURGERY     TOTAL KNEE ARTHROPLASTY Right 11/19/2016   Procedure: RIGHT TOTAL KNEE ARTHROPLASTY;  Surgeon: Wes Hamman, MD;  Location: [facility] OR;  Service: Orthopedics;  Laterality: Right;   uretha surgery-2014     Social History:  reports that he has never smoked. He has  never been exposed to tobacco smoke. He has never used smokeless tobacco. He reports that he does not drink alcohol  and does not use drugs.  Allergies  Allergen Reactions   Lisinopril  Cough and Other (See Comments)    Muscle pain    Family History  Problem Relation Age of Onset   Breast cancer Mother 37       d.89   Breast cancer Sister 30       d.30   Leukemia Brother 18       d.20   Breast cancer Maternal Aunt 40       d.40s   Lung cancer Maternal Uncle    Prostate cancer Paternal Uncle    Prostate cancer Brother        recurred recently at age 36   Other Brother 15       spinal tumor   Cervical cancer Other 22       d.22   Cancer Sister 73       unspecified type   Cancer Maternal Uncle 80       unspecified type    Prior to Admission medications   Medication Sig Start Date End Date Taking? Authorizing Provider  amoxicillin -clavulanate (AUGMENTIN ) 875-125 MG tablet Take 1 tablet by mouth 2 (two) times daily for 7 days. 07/02/23 07/09/23 Yes Medina-Vargas, Monina C, NP  aspirin  EC 81 MG tablet Take 81 mg by mouth daily.   Yes [provider]  carvedilol  (COREG ) 12.5 MG tablet TAKE 1 TABLET TWICE DAILY WITH MEALS 02/15/23  Yes [name], Jessica K, NP  Cholecalciferol  (VITAMIN D3) 50 MCG (2000 UT) TABS Take 1 tablet (2,000 Units) by mouth daily. 01/19/22  Yes   empagliflozin  (JARDIANCE ) 10 MG TABS tablet Take 1 tablet (10 mg total) by mouth daily. 01/20/23  Yes Verma Gobble, NP  furosemide  (LASIX ) 20 MG tablet TAKE 1 TABLET AS DIRECTED AS NEEDED 12/14/22  Yes Marlyse Single T, PA-C  hydrALAZINE  (APRESOLINE ) 25 MG tablet TAKE 3 TABLETS THREE TIMES DAILY 12/02/22  Yes [name], Jessica K, NP  losartan  (COZAAR ) 50 MG tablet TAKE 1 TABLET EVERY DAY 12/31/22  Yes [name], Jessica K, NP  metFORMIN  (GLUCOPHAGE ) 500 MG tablet TAKE 1 TABLET TWICE DAILY WITH MEALS 02/15/23  Yes [name], Jessica K, NP  potassium chloride  SA (KLOR-CON  M) 20 MEQ tablet TAKE 1 TABLET TWICE DAILY  01/07/23  Yes [name], Jessica K, NP  spironolactone  (ALDACTONE ) 50 MG tablet Take 50 mg by mouth daily. 07/07/22  Yes Lucendia Rusk, MD  zinc  gluconate 50 MG tablet Take 1 tablet (50 mg total) by mouth daily. 11/18/21  Yes Ngetich, Dinah C, NP  Accu-Chek Softclix Lancets lancets Use to test blood sugar daily 06/10/22   Verma Gobble, NP  acetaminophen  (TYLENOL ) 500 MG tablet Take by mouth. 09/28/19   [provider]  Alcohol  Swabs (B-D SINGLE USE SWABS REGULAR) PADS Use in testing blood sugar daily. Dx: E11.22 05/20/21   Verma Gobble,  NP  amLODipine  (NORVASC ) 5 MG tablet Take 1 tablet (5 mg total) by mouth daily. 05/24/23   Verma Gobble, NP  atorvastatin  (LIPITOR) 40 MG tablet TAKE 1 TABLET EVERY DAY Patient taking differently: Take 40 mg by mouth daily. Patient is taking daily in the evening 12/14/22   Verma Gobble, NP  Blood Glucose Calibration (ACCU-CHEK AVIVA) SOLN Use once daily as directed dx E11.22 08/25/19   [name], Jessica K, NP  Blood Glucose Monitoring Suppl (TRUE METRIX AIR GLUCOSE METER) w/Device KIT 1 Device by Does not apply route daily as needed. E11.22 05/20/21   Verma Gobble, NP  glucose blood (ACCU-CHEK AVIVA PLUS) test strip Use to test blood sugar daily. 03/24/22   Verma Gobble, NP  Lancets Misc. (ACCU-CHEK SOFTCLIX LANCET DEV) KIT Use to test blood sugar daily 03/18/21   Verma Gobble, NP  rivaroxaban  (XARELTO ) 20 MG TABS tablet Take 1 tablet (20 mg total) by mouth daily with supper. 08/18/21   [name], Jessica K, NP  Senna 8.7 MG CHEW Chew 1 tablet by mouth as needed. 05/20/21   Verma Gobble, NP  spironolactone  (ALDACTONE ) 25 MG tablet TAKE 1 TABLET EVERY DAY 06/10/23   Lucendia Rusk, MD  tirzepatide  (MOUNJARO ) 15 MG/0.5ML Pen Inject 15 mg into the skin once a week. 04/09/23   Jann Melody, MD  traMADol  (ULTRAM ) 50 MG tablet Take 1 tablet (50 mg total) by mouth every 6 (six) hours as needed for pain 03/02/23    Verma Gobble, NP  TRUEplus Lancets 28G MISC TEST BLOOD SUGAR EVERY DAY AS NEEDED 01/06/23   Verma Gobble, NP    Physical Exam: Vitals:   07/03/23 1145 07/03/23 1300 07/03/23 1414 07/03/23 1418  BP: 121/71 (!) 155/92  115/73  Pulse: 81 86  (!) 115  Resp: 20 17  18   Temp: 98 F (36.7 C) 97.9 F (36.6 C)  97.6 F (36.4 C)  TempSrc:  Oral  Oral  SpO2: 96% 97%  98%  Weight:   (!) 136.9 kg   Height:   5\' 9"  (1.753 m)    General:  Appears calm and comfortable and is in NAD, obese  Eyes:  PERRL, EOMI, normal lids, iris ENT:  grossly normal hearing, lips & tongue, mmm; appropriate dentition Neck:  no LAD, masses or thyromegaly; no carotid bruits Cardiovascular:  irregularly,irregular. no m/r/g. No LE edema.  Respiratory:   CTA bilaterally with no wheezes/rales/rhonchi.  Normal respiratory effort. Decreased air movement in bases  Abdomen:  soft, NT, ND, NABS Back:   normal alignment, no CVAT Skin:  no rash or induration seen on limited exam Musculoskeletal:  grossly normal tone BUE/BLE, good ROM, no bony abnormality Lower extremity:  No LE edema.  Limited foot exam with no ulcerations.  2+ distal pulses. Psychiatric:  grossly normal mood and affect, speech fluent and appropriate, AOx3 Neurologic:  CN 2-12 grossly intact, moves all extremities in coordinated fashion, sensation intact   Radiological Exams on Admission: Independently reviewed - see discussion in A/P where applicable  DG Chest Port 1 View Result Date: 07/02/2023 CLINICAL DATA:  SOB, Afib EXAM: PORTABLE CHEST - 1 VIEW COMPARISON:  02/11/2022. FINDINGS: Cardiac silhouette is prominent. There is pulmonary interstitial prominence with vascular congestion. No focal consolidation. No pneumothorax or pleural effusion identified. IMPRESSION: Findings suggest CHF. Electronically Signed   By: Sydell Eva M.D.   On: 07/02/2023 23:45    EKG: Independently reviewed.  Atrial fibrillation with RVR with rate  139;  nonspecific ST changes with no evidence of acute ischemia   Labs on Admission: I have personally reviewed the available labs and imaging studies at the time of the admission.  Pertinent labs:   WBC 10.8, BUN: 27,  creatinine: 1.95,  troponin 26>26,   Assessment and Plan: Principal Problem:   Atrial fibrillation with RVR (HCC) Active Problems:   Acute renal failure superimposed on stage 3b chronic kidney disease (HCC)   Heart failure with mildly reduced ejection fraction (HFmrEF) (HCC)   Chronic respiratory failure with hypoxia (HCC)   presumed UTI   Type 2 diabetes mellitus with chronic kidney disease, without long-term current use of insulin  (HCC)   Urine retention   HTN (hypertension)   Hyperlipidemia with target LDL less than 100   CAD (coronary artery disease)   Benign prostatic hyperplasia with urinary obstruction   Sleep apnea    Assessment and Plan: * Atrial fibrillation with RVR (HCC) 73 year old with history of atrial fibrillation on xarelto  who presented to ED with palpitations and elevated heart rate x 1 day found to be in atrial fibrillation with RVR -obs to tele -started on cardizem  gtt, will transition to oral when parameters met. Give coreg  dose now -magnesium  wnl, check TSH -CHA2DS2-VASc of 3. he has not taking his xarelto  x 1 week due to lots of stressful events in family, started back  -repeat echo -He has multiple contributing co morbidities including PAH, HFmrEF, HTN, stress and possible infection that could be contributing to RVR -x1 dose of IV lasix  40mg  for interstitial edema in lungs, but overall does not appear volume overloaded  -cardiology consulted, follows with Dr. Paulita Boss.   Acute renal failure superimposed on stage 3b chronic kidney disease (HCC) Likely pre renal in setting of hypo perfusion from afib with rVR Baseline creatinine appears to be around 1.3-1.5 and was 1.95 today Check UA  Avoid nephrotoxic drugs X1 dose of lasix  and  watch diuresis  Trend   Heart failure with mildly reduced ejection fraction (HFmrEF) (HCC) Echo 04/2022: EF of 45-50%, mildly decreased LV function, grade 1 DD. Moderately elevated pulmonary arterial pressure  Appears to have some overload from atrial fib with RVR on CXR, but clinically appears euvolemic  Give 1 dose of IV lasix , 40mg   Follow diuresis Strict I/O and daily weights  Repeat echo pending   Chronic respiratory failure with hypoxia (HCC) -followed by pulmonology  -in setting of obesity, CTEPH, and chronic HFrEF - he is still needing supplemental oxygen  with exertion - goal SpO2 > 90%   presumed UTI Seen by PCP yesterday for cloudy urine and started on Augmentin  for presumed UTI off UA Culture not done, I have ordered one here  Stop jardiance  for now  F/u on culture   Type 2 diabetes mellitus with chronic kidney disease, without long-term current use of insulin  (HCC) A1C of 5.5 in April of 2025 Hold metformin  and GLP-1 SSI and accuchecks QAC/HS   Urine retention Suprapubic catheter, continue foley care   HTN (hypertension) On hydralazine , losartan , norvasc , lasix  and coreg  Start coreg  now, hold losartan  with AKI on cKD Lasix  to IV Hold hydralazine  for now while on cardizem  gtt Blood pressure stable   Hyperlipidemia with target LDL less than 100 Continue lipitor   CAD (coronary artery disease) -Minimal non-obstructive disease by cardiac catheterization in May 2019.  -continue medical management with ASA, statin, beta blocker   Benign prostatic hyperplasia with urinary obstruction Suprapubic catheter   Sleep apnea Continue cpap  Advance Care Planning:   Code Status: Full Code   Consults: cardiology: Dr. Renna Cary   DVT Prophylaxis: xarelto    Family Communication:  sister, son and daughter at bedside   Severity of Illness: The appropriate patient status for this patient is OBSERVATION. Observation status is judged to be reasonable and necessary in  order to provide the required intensity of service to ensure the patient's safety. The patient's presenting symptoms, physical exam findings, and initial radiographic and laboratory data in the context of their medical condition is felt to place them at decreased risk for further clinical deterioration. Furthermore, it is anticipated that the patient will be medically stable for discharge from the hospital within 2 midnights of admission.   Author: Raymona Caldwell, MD 07/03/2023 3:19 PM  For on call review [email or web address].

## 2023-07-03 NOTE — Assessment & Plan Note (Signed)
 Suprapubic catheter, continue foley care

## 2023-07-03 NOTE — Consult Note (Signed)
 CARDIOLOGY CONSULT NOTE    Patient ID: Thomas [name] MRN: [id number], DOB/AGE: [birth year] 73 y.o.  Admit date: 07/02/2023 Date of Consult: 07/03/2023  Primary Physician: Verma Gobble, NP Primary Cardiologist: Dr. Paulita Boss Electrophysiologist: not established  Patient Profile: Thomas [name] is a 73 y.o. male with a history of paroxysmal atrial fibrillation, CHFmrEF, DVT and PE, type 2 diabetes, obstructive sleep apnea, prostate cancer and urinary retention with a chronic suprapubic catheter, hypertension who is being seen today for the evaluation of atrial fibrillation with rapid ventricular rates at the request of Dr. Sullivan Endow.  HPI:  Thomas [name] is a 73 y.o. male with a history of recovered ejection fraction, prostate cancer with chronic suprapubic catheter, paroxysmal atrial fibrillation.  He noted cloudy urine in his catheter bag, treated by his PCP.  He woke from a nap yesterday and noted that his heart rate was 138 bpm and his oxygen  level 92% by his pulse oximeter.  He did not experience any palpitations, shortness of breath, chest pain, dizziness.  To his knowledge, he has had 1 other episode of atrial fibrillation.  EKGs from April 2019 show A-fib.  He remarks that he did have palpitations and chest pounding at that time.  He denies chest pain, palpitations, dyspnea, PND, orthopnea, nausea, vomiting, dizziness, syncope, edema, or weight gain.  Past Medical History:  Diagnosis Date   Acute on chronic diastolic CHF (congestive heart failure) (HCC) 06/01/2017   Arthritis    Cancer (HCC) 2010   Prostate   Chronic kidney disease    Diabetes mellitus    Diabetic neuropathy (HCC)    feet   DVT (deep venous thrombosis) (HCC)    Genetic testing 06/22/2016   Mr. Wolden underwent genetic counseling and testing for hereditary cancer syndromes on 05/14/2016. His results were negative for mutations in all 46 genes analyzed by [facility]'s 46-gene Common  Hereditary Cancers Panel. Genes analyzed include: APC, ATM, AXIN2, BARD1, BMPR1A, BRCA1, BRCA2, BRIP1, CDH1, CDKN2A, CHEK2, CTNNA1, DICER1, EPCAM, GREM1, HOXB13, KIT, MEN1, MLH1, MSH2, MSH3, MSH6, MUTYH, NB   GERD (gastroesophageal reflux disease)    Hypertension    PE (pulmonary thromboembolism) (HCC)    Pneumonia    Sleep apnea    not wearing CPAP   Suprapubic catheter (HCC) 11/17/2019      Home medications Medications Prior to Admission  Medication Sig Dispense Refill Last Dose/Taking   amLODipine  (NORVASC ) 5 MG tablet Take 1 tablet (5 mg total) by mouth daily. (Patient taking differently: Take 5 mg by mouth in the morning.) 90 tablet 0 07/01/2023   amoxicillin -clavulanate (AUGMENTIN ) 875-125 MG tablet Take 1 tablet by mouth 2 (two) times daily for 7 days. 14 tablet 0 07/03/2023 Morning   aspirin  EC 81 MG tablet Take 81 mg by mouth daily.   07/01/2023   atorvastatin  (LIPITOR) 40 MG tablet TAKE 1 TABLET EVERY DAY (Patient taking differently: Take 40 mg by mouth daily. Patient is taking daily in the evening) 90 tablet 3 07/01/2023   calcium  carbonate (TUMS - DOSED IN MG ELEMENTAL CALCIUM ) 500 MG chewable tablet Chew 2 tablets by mouth as needed for indigestion or heartburn.   Past Week   carvedilol  (COREG ) 12.5 MG tablet TAKE 1 TABLET TWICE DAILY WITH MEALS 180 tablet 3 07/01/2023   Cholecalciferol  (VITAMIN D3) 50 MCG (2000 UT) TABS Take 1 tablet (2,000 Units) by mouth daily. (Patient taking differently: Take 2,000 Units by mouth in the morning.) 10 tablet 0 07/01/2023   empagliflozin  (JARDIANCE ) 10 MG  TABS tablet Take 1 tablet (10 mg total) by mouth daily. 30 tablet 0 07/02/2023 Morning   furosemide  (LASIX ) 20 MG tablet TAKE 1 TABLET AS DIRECTED AS NEEDED (Patient taking differently: Take 20 mg by mouth in the morning.) 90 tablet 3 07/01/2023   hydrALAZINE  (APRESOLINE ) 25 MG tablet TAKE 3 TABLETS THREE TIMES DAILY (Patient taking differently: Take 75 mg by mouth 3 (three) times daily.) 810 tablet 3  07/01/2023   losartan  (COZAAR ) 50 MG tablet TAKE 1 TABLET EVERY DAY (Patient taking differently: Take 50 mg by mouth in the morning.) 90 tablet 3 07/01/2023   metFORMIN  (GLUCOPHAGE ) 500 MG tablet TAKE 1 TABLET TWICE DAILY WITH MEALS 180 tablet 3 07/01/2023   Multiple Vitamin (MULTIVITAMIN ADULT PO) Take 2 each by mouth as needed.   Past Week   potassium chloride  SA (KLOR-CON  M) 20 MEQ tablet TAKE 1 TABLET TWICE DAILY (Patient taking differently: Take 20 mEq by mouth in the morning.) 180 tablet 3 07/01/2023   rivaroxaban  (XARELTO ) 20 MG TABS tablet Take 1 tablet (20 mg total) by mouth daily with supper. 30 tablet 12 Past Week   Senna 8.7 MG CHEW Chew 1 tablet by mouth as needed. 90 tablet 1 Unknown   spironolactone  (ALDACTONE ) 25 MG tablet TAKE 1 TABLET EVERY DAY (Patient taking differently: Take 50 mg by mouth in the morning.) 90 tablet 0 07/01/2023   tirzepatide  (MOUNJARO ) 15 MG/0.5ML Pen Inject 15 mg into the skin once a week. (Patient taking differently: Inject 15 mg into the skin once a week. On Sundays) 2 mL 2 06/27/2023   traMADol  (ULTRAM ) 50 MG tablet Take 1 tablet (50 mg total) by mouth every 6 (six) hours as needed for pain 30 tablet 0 07/01/2023   zinc  gluconate 50 MG tablet Take 1 tablet (50 mg total) by mouth daily. 14 tablet 0 Past Week   Accu-Chek Softclix Lancets lancets Use to test blood sugar daily 200 each 3    Alcohol  Swabs (B-D SINGLE USE SWABS REGULAR) PADS Use in testing blood sugar daily. Dx: E11.22 100 each 3    Blood Glucose Calibration (ACCU-CHEK AVIVA) SOLN Use once daily as directed dx E11.22 1 each 2    Blood Glucose Monitoring Suppl (TRUE METRIX AIR GLUCOSE METER) w/Device KIT 1 Device by Does not apply route daily as needed. E11.22 1 kit 0    glucose blood (ACCU-CHEK AVIVA PLUS) test strip Use to test blood sugar daily. 100 each 3    Lancets Misc. (ACCU-CHEK SOFTCLIX LANCET DEV) KIT Use to test blood sugar daily 1 kit 0    TRUEplus Lancets 28G MISC TEST BLOOD SUGAR EVERY DAY  AS NEEDED 100 each 3       Physical Exam: Vitals:   07/03/23 1145 07/03/23 1300 07/03/23 1414 07/03/23 1418  BP: 121/71 (!) 155/92  115/73  Pulse: 81 86  (!) 115  Resp: 20 17  18   Temp: 98 F (36.7 C) 97.9 F (36.6 C)  97.6 F (36.4 C)  TempSrc:  Oral  Oral  SpO2: 96% 97%  98%  Weight:   (!) 136.9 kg   Height:   5\' 9"  (1.753 m)     Gen: Appears comfortable, well-nourished CV: irregular rhythm, rates controlled; no significant dependent edema or JVD. No M/R/G Pulm: breathing easily; breath sounds distant but no audible rales  PERTINENT STUDIES SUMMARIZED:  Echocardiogram:      Repeat study ordered Last TTE, 05/13/2022: EF 45 to 50%.  Grade 1 diastolic dysfunction.  Mild to  moderately dilated left atrium.  Moderately dilated right atrium.  Moderately elevated pulmonary artery systolic pressure.  Heart Cath: May 2019: LVEDP 30 mmHg.  Hemodynamic findings consistent with moderate to severe pulmonary hypertension.  Nonobstructive coronary disease  Imaging: CXR: Prominent cardiac silhouette with pulmonary and or special prominence and vascular congestion.   EKG: Atrial fibrillation with RVR, rate 139 bpm.  (personally reviewed)  TELEMETRY:  Atrial fibrillation, rates improved (personally reviewed)    ASSESSMENT & PLAN:  Atrial fibrillation with RVR Minimally symptomatic CHA2DS2-VASc score is 3 He has not taken his Xarelto  for the past week Continue Xarelto  20 mg Repeat echocardiogram is pending Would switch from carvedilol  to metoprolol 50 mg twice daily to permit slightly better rate control with available blood pressure  HFmrEF Due to nonischemic cardiomyopathy EF March 2024: 45 to 50% Repeat echocardiogram pending Chest x-ray shows some pulmonary edema, but he does not appear grossly volume overloaded Continue diuresis with IV lasix   Pulmonary hypertension Repeat echocardiogram pending Continue with diuresis  Nonobstructive coronary artery  disease Discontinue aspirin  81 mg  History of DVT and pulmonary embolism in 2018 Continue Xarelto   Acute kidney injury Creatinine 1.95 on admission In the setting of CKD prior value about a month ago was 1.55   For questions or updates, please contact [facility] Please consult [email or web address] for contact info under Cardiology/STEMI.  Signed, Marlane Silver, MD 07/03/2023 3:53 PM

## 2023-07-03 NOTE — Assessment & Plan Note (Signed)
 Continue lipitor  ?

## 2023-07-03 NOTE — Progress Notes (Signed)
 MEWS Progress Note  Patient Details Name: Thomas [name] MRN: [id number] DOB: [birth year] Today's Date: 07/03/2023   MEWS Flowsheet Documentation:  Assess: MEWS Score Temp: 97.6 F (36.4 C) BP: 115/73 MAP (mmHg): 87 Pulse Rate: (!) 115 ECG Heart Rate: (!) 115 Resp: 18 Level of Consciousness: Alert SpO2: 98 % O2 Device: Nasal Cannula O2 Flow Rate (L/min): 3 L/min Assess: MEWS Score MEWS Temp: 0 MEWS Systolic: 0 MEWS Pulse: 2 MEWS RR: 0 MEWS LOC: 0 MEWS Score: 2 MEWS Score Color: Yellow Assess: SIRS CRITERIA SIRS Temperature : 0 SIRS Respirations : 0 SIRS Pulse: 1 SIRS WBC: 0 SIRS Score Sum : 1 SIRS Temperature : 0 SIRS Pulse: 1 SIRS Respirations : 0 SIRS WBC: 0 SIRS Score Sum : 1 Assess: if the MEWS score is Yellow or Red Were vital signs accurate and taken at a resting state?: Yes Does the patient meet 2 or more of the SIRS criteria?: No MEWS guidelines implemented : Yes, yellow Treat MEWS Interventions: Considered administering scheduled or prn medications/treatments as ordered Take Vital Signs Increase Vital Sign Frequency : Yellow: Q2hr x1, continue Q4hrs until patient remains green for 12hrs Escalate MEWS: Escalate: Yellow: Discuss with charge nurse and consider notifying provider and/or RRT        Harvis Ling 07/03/2023, 3:29 PM

## 2023-07-03 NOTE — Assessment & Plan Note (Signed)
 Likely pre renal in setting of hypo perfusion from afib with rVR Baseline creatinine appears to be around 1.3-1.5 and was 1.95 today Check UA  Avoid nephrotoxic drugs X1 dose of lasix  and watch diuresis  Trend

## 2023-07-03 NOTE — ED Notes (Signed)
 Called [facility] for transport, pt bed assignment is ready

## 2023-07-03 NOTE — Assessment & Plan Note (Signed)
 Continue cpap.

## 2023-07-04 ENCOUNTER — Observation Stay (HOSPITAL_COMMUNITY)

## 2023-07-04 DIAGNOSIS — E785 Hyperlipidemia, unspecified: Secondary | ICD-10-CM | POA: Diagnosis present

## 2023-07-04 DIAGNOSIS — N138 Other obstructive and reflux uropathy: Secondary | ICD-10-CM | POA: Diagnosis present

## 2023-07-04 DIAGNOSIS — I5042 Chronic combined systolic (congestive) and diastolic (congestive) heart failure: Secondary | ICD-10-CM | POA: Diagnosis present

## 2023-07-04 DIAGNOSIS — I2510 Atherosclerotic heart disease of native coronary artery without angina pectoris: Secondary | ICD-10-CM | POA: Diagnosis present

## 2023-07-04 DIAGNOSIS — Z8042 Family history of malignant neoplasm of prostate: Secondary | ICD-10-CM

## 2023-07-04 DIAGNOSIS — Z9359 Other cystostomy status: Secondary | ICD-10-CM | POA: Diagnosis not present

## 2023-07-04 DIAGNOSIS — N179 Acute kidney failure, unspecified: Secondary | ICD-10-CM | POA: Diagnosis present

## 2023-07-04 DIAGNOSIS — Z79899 Other long term (current) drug therapy: Secondary | ICD-10-CM

## 2023-07-04 DIAGNOSIS — N401 Enlarged prostate with lower urinary tract symptoms: Secondary | ICD-10-CM | POA: Diagnosis present

## 2023-07-04 DIAGNOSIS — Z6841 Body Mass Index (BMI) 40.0 and over, adult: Secondary | ICD-10-CM

## 2023-07-04 DIAGNOSIS — Z7985 Long-term (current) use of injectable non-insulin antidiabetic drugs: Secondary | ICD-10-CM

## 2023-07-04 DIAGNOSIS — E1140 Type 2 diabetes mellitus with diabetic neuropathy, unspecified: Secondary | ICD-10-CM | POA: Diagnosis present

## 2023-07-04 DIAGNOSIS — N1832 Chronic kidney disease, stage 3b: Secondary | ICD-10-CM | POA: Diagnosis present

## 2023-07-04 DIAGNOSIS — G4733 Obstructive sleep apnea (adult) (pediatric): Secondary | ICD-10-CM | POA: Diagnosis present

## 2023-07-04 DIAGNOSIS — I4891 Unspecified atrial fibrillation: Secondary | ICD-10-CM

## 2023-07-04 DIAGNOSIS — K5900 Constipation, unspecified: Secondary | ICD-10-CM | POA: Diagnosis present

## 2023-07-04 DIAGNOSIS — K219 Gastro-esophageal reflux disease without esophagitis: Secondary | ICD-10-CM | POA: Diagnosis present

## 2023-07-04 DIAGNOSIS — R000 Tachycardia, unspecified: Secondary | ICD-10-CM | POA: Diagnosis present

## 2023-07-04 DIAGNOSIS — R338 Other retention of urine: Secondary | ICD-10-CM | POA: Diagnosis present

## 2023-07-04 DIAGNOSIS — I480 Paroxysmal atrial fibrillation: Principal | ICD-10-CM | POA: Diagnosis present

## 2023-07-04 DIAGNOSIS — I2724 Chronic thromboembolic pulmonary hypertension: Secondary | ICD-10-CM | POA: Diagnosis present

## 2023-07-04 DIAGNOSIS — E1122 Type 2 diabetes mellitus with diabetic chronic kidney disease: Secondary | ICD-10-CM | POA: Diagnosis present

## 2023-07-04 DIAGNOSIS — Z96651 Presence of right artificial knee joint: Secondary | ICD-10-CM | POA: Diagnosis present

## 2023-07-04 DIAGNOSIS — N390 Urinary tract infection, site not specified: Secondary | ICD-10-CM | POA: Diagnosis present

## 2023-07-04 DIAGNOSIS — Z86711 Personal history of pulmonary embolism: Secondary | ICD-10-CM

## 2023-07-04 DIAGNOSIS — Z91138 Patient's unintentional underdosing of medication regimen for other reason: Secondary | ICD-10-CM

## 2023-07-04 DIAGNOSIS — Z86718 Personal history of other venous thrombosis and embolism: Secondary | ICD-10-CM

## 2023-07-04 DIAGNOSIS — I428 Other cardiomyopathies: Secondary | ICD-10-CM | POA: Diagnosis present

## 2023-07-04 DIAGNOSIS — Z888 Allergy status to other drugs, medicaments and biological substances status: Secondary | ICD-10-CM

## 2023-07-04 DIAGNOSIS — I130 Hypertensive heart and chronic kidney disease with heart failure and stage 1 through stage 4 chronic kidney disease, or unspecified chronic kidney disease: Secondary | ICD-10-CM | POA: Diagnosis present

## 2023-07-04 DIAGNOSIS — T45516A Underdosing of anticoagulants, initial encounter: Secondary | ICD-10-CM | POA: Diagnosis present

## 2023-07-04 DIAGNOSIS — B964 Proteus (mirabilis) (morganii) as the cause of diseases classified elsewhere: Secondary | ICD-10-CM | POA: Diagnosis present

## 2023-07-04 DIAGNOSIS — Z7901 Long term (current) use of anticoagulants: Secondary | ICD-10-CM | POA: Diagnosis not present

## 2023-07-04 DIAGNOSIS — E66813 Obesity, class 3: Secondary | ICD-10-CM | POA: Diagnosis present

## 2023-07-04 DIAGNOSIS — Z806 Family history of leukemia: Secondary | ICD-10-CM

## 2023-07-04 DIAGNOSIS — J9611 Chronic respiratory failure with hypoxia: Secondary | ICD-10-CM | POA: Diagnosis present

## 2023-07-04 DIAGNOSIS — Z801 Family history of malignant neoplasm of trachea, bronchus and lung: Secondary | ICD-10-CM

## 2023-07-04 DIAGNOSIS — Z7984 Long term (current) use of oral hypoglycemic drugs: Secondary | ICD-10-CM

## 2023-07-04 DIAGNOSIS — Z7982 Long term (current) use of aspirin: Secondary | ICD-10-CM

## 2023-07-04 DIAGNOSIS — Z8546 Personal history of malignant neoplasm of prostate: Secondary | ICD-10-CM

## 2023-07-04 DIAGNOSIS — Z803 Family history of malignant neoplasm of breast: Secondary | ICD-10-CM

## 2023-07-04 LAB — ECHOCARDIOGRAM COMPLETE
Height: 69 in
S' Lateral: 3.8 cm
Weight: 4857.6 [oz_av]

## 2023-07-04 LAB — BASIC METABOLIC PANEL WITH GFR
Anion gap: 7 (ref 5–15)
BUN: 22 mg/dL (ref 8–23)
CO2: 25 mmol/L (ref 22–32)
Calcium: 8.3 mg/dL — ABNORMAL LOW (ref 8.9–10.3)
Chloride: 107 mmol/L (ref 98–111)
Creatinine, Ser: 1.66 mg/dL — ABNORMAL HIGH (ref 0.61–1.24)
GFR, Estimated: 43 mL/min — ABNORMAL LOW (ref >60)
Glucose, Bld: 129 mg/dL — ABNORMAL HIGH (ref 70–99)
Potassium: 4.2 mmol/L (ref 3.5–5.1)
Sodium: 139 mmol/L (ref 135–145)

## 2023-07-04 LAB — CBC
HCT: 44.2 % (ref 39.0–52.0)
Hemoglobin: 14.4 g/dL (ref 13.0–17.0)
MCH: 28.9 pg (ref 26.0–34.0)
MCHC: 32.6 g/dL (ref 30.0–36.0)
MCV: 88.8 fL (ref 80.0–100.0)
Platelets: 256 10*3/uL (ref 150–400)
RBC: 4.98 MIL/uL (ref 4.22–5.81)
RDW: 13.7 % (ref 11.5–15.5)
WBC: 8 10*3/uL (ref 4.0–10.5)
nRBC: 0 % (ref 0.0–0.2)

## 2023-07-04 LAB — GLUCOSE, CAPILLARY
Glucose-Capillary: 126 mg/dL — ABNORMAL HIGH (ref 70–99)
Glucose-Capillary: 141 mg/dL — ABNORMAL HIGH (ref 70–99)
Glucose-Capillary: 113 mg/dL — ABNORMAL HIGH (ref 70–99)
Glucose-Capillary: 126 mg/dL — ABNORMAL HIGH (ref 70–99)

## 2023-07-04 MED ORDER — PERFLUTREN LIPID MICROSPHERE
1.0000 mL | INTRAVENOUS | Status: AC | PRN
  Administered 2023-07-04: 3 mL via INTRAVENOUS

## 2023-07-04 MED ORDER — METOPROLOL TARTRATE 50 MG PO TABS
50.0000 mg | ORAL_TABLET | Freq: Two times a day (BID) | ORAL | Status: DC
  Administered 2023-07-04 – 2023-07-05 (×3): 50 mg via ORAL
  Filled 2023-07-04 (×3): qty 1

## 2023-07-04 NOTE — Hospital Course (Addendum)
 Thomas [name]

## 2023-07-04 NOTE — Progress Notes (Signed)
 Progress Note  Patient Name: Thomas [name] Date of Encounter: 07/04/2023  Primary Cardiologist: Jann Melody, MD   Subjective   Feeling ok. He notes heart rates increase with minimal movement.  Inpatient Medications    Scheduled Meds:  amoxicillin -clavulanate  1 tablet Oral BID   aspirin  EC  81 mg Oral Daily   atorvastatin   40 mg Oral Daily   carvedilol   12.5 mg Oral BID WC   cholecalciferol   2,000 Units Oral Daily   insulin  aspart  0-9 Units Subcutaneous TID WC   rivaroxaban   20 mg Oral Q supper   Continuous Infusions:  diltiazem  (CARDIZEM ) infusion 5 mg/hr (07/03/23 1835)   PRN Meds: acetaminophen  **OR** acetaminophen , perflutren  lipid microspheres (DEFINITY ) IV suspension, senna, traMADol    Vital Signs    Vitals:   07/03/23 2055 07/03/23 2323 07/04/23 0510 07/04/23 0746  BP: 99/74 106/69 125/68 114/74  Pulse: 66 90 89 91  Resp: 17 16 18 17   Temp:  98 F (36.7 C) (!) 97.5 F (36.4 C) 97.9 F (36.6 C)  TempSrc:  Oral Oral Oral  SpO2: 95% 96% 97% 95%  Weight:   (!) 137.7 kg   Height:        Intake/Output Summary (Last 24 hours) at 07/04/2023 1204 Last data filed at 07/04/2023 0900 Gross per 24 hour  Intake 1178.39 ml  Output 1550 ml  Net -371.61 ml   Filed Weights   07/03/23 1414 07/04/23 0510  Weight: (!) 136.9 kg (!) 137.7 kg    Telemetry    Atrial fibrillation with controlled rates - Personally Reviewed  ECG    No new   Physical Exam   GEN: No acute distress.   Neck: No JVD Cardiac: RRR, no murmurs, rubs, or gallops.  Respiratory: Clear to auscultation bilaterally. GI: Soft, nontender, non-distended  MS: No edema; No deformity. Neuro:  Nonfocal  Psych: Normal affect   Labs    Chemistry Recent Labs  Lab 07/02/23 2309 07/04/23 0455  NA 141 139  K 4.1 4.2  CL 107 107  CO2 20* 25  GLUCOSE 156* 129*  BUN 27* 22  CREATININE 1.95* 1.66*  CALCIUM  9.1 8.3*  GFRNONAA 36* 43*  ANIONGAP 14 7     Hematology Recent  Labs  Lab 07/02/23 2309 07/04/23 0455  WBC 10.8* 8.0  RBC 5.12 4.98  HGB 14.8 14.4  HCT 46.5 44.2  MCV 90.8 88.8  MCH 28.9 28.9  MCHC 31.8 32.6  RDW 13.9 13.7  PLT 300 256    Cardiac EnzymesNo results for input(s): "TROPONINI" in the last 168 hours. No results for input(s): "TROPIPOC" in the last 168 hours.   BNP Recent Labs  Lab 07/03/23 1726  BNP 418.8*     DDimer No results for input(s): "DDIMER" in the last 168 hours.   Summary of Pertinent studies    TTE: Performed, read pending    Labs: Creatinine improving 1.95-> 1.66  Patient Profile     73 y.o. male  with a history of paroxysmal atrial fibrillation, CHFmrEF, DVT and PE, type 2 diabetes, obstructive sleep apnea, prostate cancer and urinary retention with a chronic suprapubic catheter, hypertension who is being seen today for the evaluation of atrial fibrillation with rapid ventricular rates at the request of Dr. Sullivan Endow.   Assessment & Plan    Atrial fibrillation with RVR Minimally symptomatic CHA2DS2-VASc score is 3 He has not taken his Xarelto  for the past week Continue Xarelto  20 mg -- would consider repeat DCCV  as outpatient when he has been anticoagulated appropriately. Repeat echocardiogram is pending Would switch from carvedilol  to metoprolol 50 mg twice daily to permit slightly better rate control with available blood pressure   HFmrEF Due to nonischemic cardiomyopathy EF March 2024: 45 to 50% Repeat echocardiogram pending Chest x-ray shows some pulmonary edema, but he does not appear grossly volume overloaded Continue diuresis with IV lasix    Pulmonary hypertension Repeat echocardiogram pending Continue with diuresis   Nonobstructive coronary artery disease Discontinue aspirin  81 mg while on anticoagulation   History of DVT and pulmonary embolism in 2018 Continue Xarelto    Acute kidney injury Creatinine 1.95 on admission In the setting of CKD prior value about a month ago was  1.55 Now returning to baseline    For questions or updates, please contact [facility] Please consult [email or web address] for contact info under Cardiology/STEMI.      Signed, [name] Grange, MD 07/04/2023, 12:04 PM

## 2023-07-04 NOTE — Care Management Obs Status (Signed)
 MEDICARE OBSERVATION STATUS NOTIFICATION   Patient Details  Name: Thomas [name] MRN: [id number] Date of Birth: 1950/09/24   Medicare Observation Status Notification Given:  Yes    Cindia Crease, RN 07/04/2023, 2:45 PM

## 2023-07-04 NOTE — Progress Notes (Signed)
 PROGRESS NOTE  ROI JAFARI ZOX:[id number] DOB: 25-Oct-1950 DOA: 07/02/2023 PCP: Verma Gobble, NP   LOS: 0 days   Brief narrative:   Thomas [name] is a 73 y.o. male with medical history significant of PA, history of DVT/PE on xarelto , HFmrEF, T2DM, HTN, OSA, hx of prostate cancer, urinary retention with chronic suprapubic catheter, CAD, pulmonary HTN presented to hospital with cloudy urine.  He went to see his PCP on the day of presentation and was given Augmentin  but when he went home he checked his heart rate and oxygen  which was noted to be abnormal with heart rate in the 130s and had some palpitations came to the hospital for further evaluation and treatment.  In the ED heart rate was 148.  Labs were notable for creatinine of 1.9.  Troponin 25 followed by 26.  Chest x-ray showed pulmonary vascular congestion.  Patient was started on Cardizem  drip and was admitted hospital for further evaluation and treatment.     Assessment/Plan: Principal Problem:   Atrial fibrillation with RVR (HCC) Active Problems:   Acute renal failure superimposed on stage 3b chronic kidney disease (HCC)   Heart failure with mildly reduced ejection fraction (HFmrEF) (HCC)   Chronic respiratory failure with hypoxia (HCC)   presumed UTI   Type 2 diabetes mellitus with chronic kidney disease, without long-term current use of insulin  (HCC)   Urine retention   HTN (hypertension)   Hyperlipidemia with target LDL less than 100   CAD (coronary artery disease)   Benign prostatic hyperplasia with urinary obstruction   Sleep apnea   Atrial fibrillation (HCC)  Atrial fibrillation with RVR  Minimally symptomatic.  On  cardizem  gtt, coreg .  Heart rate better controlled with current drip.  TSH of 1.0. CHA2DS2-VASc of 3. he has not taking his xarelto  x 1 week due to lots of stressful events in family, started back on admission.  Check 2D echocardiogram.  Cardiology has been consulted and recommended changing  Coreg  to metoprolol 50 twice daily and discontinuation of Cardizem .  Will continue to monitor closely.   Acute renal failure superimposed on stage 3b chronic kidney disease Baseline creatinine appears to be around 1.3-1.5.  Creatinine at this time at 1.6.  Urinalysis with 6-10 white cells.  Monitor BMP   Heart failure with mildly reduced ejection fraction (HFmrEF)  Review of previous 2D echo 04/2022: EF of 45-50%, mildly decreased LV function, grade 1 DD. Moderately elevated pulmonary arterial pressure.  Received 1 dose of IV Lasix .  Continue strict intake and output charting.  Repeat echocardiogram from today shows LV ejection fraction of 40 to 45% with global hypokinesis.  Slight worsening of LV function.  Cardiology on board..  Patient is negative balance for 1111 mL   Chronic respiratory failure with hypoxia  On supplemental oxygen .  Follows up with pulmonary as outpatient.  presumed UTI Seen by PCP and was started on Augmentin .  Urinalysis with 6-10 white cells.  Urine culture pending.  Continue Augmentin  hold Jardiance .  Type 2 diabetes mellitus with chronic kidney disease, without long-term current use of insulin   Review of latest hemoglobin A1C of 5.5 in April of 2025.  Continue to hold metformin  and GLP-1 Continue sliding scale insulin  Accu-Cheks diabetic diet.   Urine retention On suprapubic catheter.  Will continue.   HTN (hypertension) Patient is on hydralazine , losartan , norvasc , lasix  and coreg  at home.  Changed to metoprolol.  Hold losartan .  Received 1 dose of IV Lasix .  Cardizem  drip has been discontinued.  Will continue to monitor blood pressure.  Hyperlipidemia with target LDL less than 100 Continue lipitor    CAD (coronary artery disease) -Minimal non-obstructive disease by cardiac catheterization in May 2019.  Continue aspirin  and statins beta-blocker.   Benign prostatic hyperplasia with urinary obstruction Continue suprapubic catheter    Sleep apnea Continue  cpap   DVT prophylaxis:  rivaroxaban  (XARELTO ) tablet 20 mg   Disposition: Home likely in 1 to 2 days  Status is: Observation The patient will require care spanning > 2 midnights and should be moved to inpatient because: Atrial fibrillation with RVR, cardiology consultation, closer monitoring,    Code Status:     Code Status: Full Code  Family Communication: Spoke with the patient's spouse at bedside  Consultants: Cardiology  Procedures: None  Anti-infectives:  Augmentin   Anti-infectives (From admission, onward)    Start     Dose/Rate Route Frequency Ordered Stop   07/03/23 1000  amoxicillin -clavulanate (AUGMENTIN ) 875-125 MG per tablet 1 tablet        1 tablet Oral 2 times daily 07/03/23 0809          Subjective: Today, patient was seen and examined at bedside.  Patient denies any chest pain, palpitation, fever or shortness of breath but has mild cough and dyspnea on exertion.  Objective: Vitals:   07/04/23 0746 07/04/23 1225  BP: 114/74 114/71  Pulse: 91 83  Resp: 17 19  Temp: 97.9 F (36.6 C) 98 F (36.7 C)  SpO2: 95% 93%    Intake/Output Summary (Last 24 hours) at 07/04/2023 1638 Last data filed at 07/04/2023 1300 Gross per 24 hour  Intake 960 ml  Output 1550 ml  Net -590 ml   Filed Weights   07/03/23 1414 07/04/23 0510  Weight: (!) 136.9 kg (!) 137.7 kg   Body mass index is 44.83 kg/m.   Physical Exam: GENERAL: Patient is alert awake and oriented. Not in obvious distress.  Obese built HENT: No scleral pallor or icterus. Pupils equally reactive to light. Oral mucosa is moist NECK: is supple, no gross swelling noted. CHEST: Clear to auscultation. No crackles or wheezes.  Diminished breath sounds bilaterally. CVS: S1 and S2 heard irregular rhythm. ABDOMEN: Soft, non-tender, bowel sounds are present. EXTREMITIES: No edema. CNS: Cranial nerves are intact. No focal motor deficits. SKIN: warm and dry without rashes.  Data Review: I have  personally reviewed the following laboratory data and studies,  CBC: Recent Labs  Lab 07/02/23 2309 07/04/23 0455  WBC 10.8* 8.0  HGB 14.8 14.4  HCT 46.5 44.2  MCV 90.8 88.8  PLT 300 256   Basic Metabolic Panel: Recent Labs  Lab 07/02/23 2309 07/04/23 0455  NA 141 139  K 4.1 4.2  CL 107 107  CO2 20* 25  GLUCOSE 156* 129*  BUN 27* 22  CREATININE 1.95* 1.66*  CALCIUM  9.1 8.3*  MG 2.1  --    Liver Function Tests: No results for input(s): "AST", "ALT", "ALKPHOS", "BILITOT", "PROT", "ALBUMIN" in the last 168 hours. No results for input(s): "LIPASE", "AMYLASE" in the last 168 hours. No results for input(s): "AMMONIA" in the last 168 hours. Cardiac Enzymes: No results for input(s): "CKTOTAL", "CKMB", "CKMBINDEX", "TROPONINI" in the last 168 hours. BNP (last 3 results) Recent Labs    07/03/23 1726  BNP 418.8*    ProBNP (last 3 results) Recent Labs    11/03/22 1000  PROBNP 659*    CBG: Recent Labs  Lab 07/03/23 1215 07/03/23 1656 07/03/23 2139 07/04/23 0744 07/04/23  1224  GLUCAP 125* 124* 159* 126* 141*   Recent Results (from the past 240 hours)  Urine Culture     Status: None   Collection Time: 07/02/23  1:28 PM   Specimen: Urine  Result Value Ref Range Status   MICRO NUMBER: [id number]  Final   SPECIMEN QUALITY: Adequate  Final   Sample Source URINE, CLEAN CATCH  Final   STATUS: FINAL  Final   Result:   Final    Mixed genital flora isolated. These superficial bacteria are not indicative of a urinary tract infection. No further organism identification is warranted on this specimen. If clinically indicated, recollect clean-catch, mid-stream urine and transfer  immediately to Urine Culture Transport Tube.   Urine Culture (for pregnant, neutropenic or urologic patients or patients with an indwelling urinary catheter)     Status: Abnormal (Preliminary result)   Collection Time: 07/03/23  2:16 PM   Specimen: Urine, Suprapubic  Result Value Ref Range Status    Specimen Description URINE, SUPRAPUBIC  Final   Special Requests NONE  Final   Culture (A)  Final    10,000 COLONIES/mL PROTEUS MIRABILIS CULTURE REINCUBATED FOR BETTER GROWTH SUSCEPTIBILITIES TO FOLLOW Performed at [facility], [address], [place], [place] [address]    Report Status PENDING  Incomplete     Studies: ECHOCARDIOGRAM COMPLETE Result Date: 07/04/2023    ECHOCARDIOGRAM REPORT   Patient Name:   Thomas [name] [facility] Date of Exam: 07/04/2023 Medical Rec #:  [id number]            Height:       69.0 in Accession #:    [id number]           Weight:       303.6 lb Date of Birth:  Dec 01, 1950            BSA:          2.467 m Patient Age:    73 years             BP:           114/74 mmHg Patient Gender: M                    HR:           85 bpm. Exam Location:  Inpatient Procedure: 2D Echo, Cardiac Doppler, Color Doppler and Intracardiac            Opacification Agent (Both Spectral and Color Flow Doppler were            utilized during procedure). Indications:    Atrial fibrillation  History:        Patient has prior history of Echocardiogram examinations, most                 recent 05/13/2022. Risk Factors:Hypertension.  Sonographer:    Janette Medley Referring Phys: [id number] ALLISON WOLFE  Sonographer Comments: Technically difficult study due to poor echo windows. IMPRESSIONS  1. Poor Echo images despite Definity .  2. Left ventricular ejection fraction, by estimation, is 40 to 45%. The left ventricle has mildly decreased function. The left ventricle demonstrates global hypokinesis. Left ventricular diastolic function could not be evaluated.  3. Right ventricular systolic function is moderately reduced. The right ventricular size is normal.  4. The mitral valve is grossly normal. No evidence of mitral valve regurgitation. No evidence of mitral stenosis.  5. The aortic valve was not well visualized. Aortic valve regurgitation is not visualized.  No aortic stenosis is present. Comparison(s):  Changes from prior study are noted. LVEF worsened from 45-50% to 40-45%. FINDINGS  Left Ventricle: Left ventricular ejection fraction, by estimation, is 40 to 45%. The left ventricle has mildly decreased function. The left ventricle demonstrates global hypokinesis. Strain was performed and the global longitudinal strain is indeterminate. The left ventricular internal cavity size was normal in size. There is no left ventricular hypertrophy. Left ventricular diastolic function could not be evaluated due to atrial fibrillation. Left ventricular diastolic function could not be  evaluated. Right Ventricle: The right ventricular size is normal. No increase in right ventricular wall thickness. Right ventricular systolic function is moderately reduced. Left Atrium: Left atrial size was normal in size. Right Atrium: Right atrial size was normal in size. Pericardium: There is no evidence of pericardial effusion. Mitral Valve: The mitral valve is grossly normal. No evidence of mitral valve regurgitation. No evidence of mitral valve stenosis. Tricuspid Valve: The tricuspid valve is not well visualized. Tricuspid valve regurgitation is not demonstrated. No evidence of tricuspid stenosis. Aortic Valve: The aortic valve was not well visualized. Aortic valve regurgitation is not visualized. No aortic stenosis is present. Pulmonic Valve: The pulmonic valve was not well visualized. Pulmonic valve regurgitation is trivial. No evidence of pulmonic stenosis. Aorta: The aortic root is normal in size and structure. Venous: The inferior vena cava was not well visualized. IAS/Shunts: No atrial level shunt detected by color flow Doppler. Additional Comments: 3D was performed not requiring image post processing on an independent workstation and was indeterminate.  LEFT VENTRICLE PLAX 2D LVIDd:         5.00 cm   Diastology LVIDs:         3.80 cm   LV e' lateral: 9.36 cm/s LV PW:         1.00 cm LV IVS:        1.50 cm LVOT diam:     2.20 cm LV  SV:         54 LV SV Index:   22 LVOT Area:     3.80 cm  RIGHT VENTRICLE            IVC RV S prime:     9.57 cm/s  IVC diam: 2.40 cm TAPSE (M-mode): 2.4 cm LEFT ATRIUM             Index        RIGHT ATRIUM           Index LA diam:        4.50 cm 1.82 cm/m   RA Area:     19.10 cm LA Vol (A2C):   61.1 ml 24.77 ml/m  RA Volume:   57.60 ml  23.35 ml/m LA Vol (A4C):   37.8 ml 15.32 ml/m LA Biplane Vol: 48.3 ml 19.58 ml/m  AORTIC VALVE LVOT Vmax:   83.00 cm/s LVOT Vmean:  53.000 cm/s LVOT VTI:    0.141 m  AORTA Ao Root diam: 2.50 cm Ao Asc diam:  3.70 cm  SHUNTS Systemic VTI:  0.14 m Systemic Diam: 2.20 cm Vishnu Priya Mallipeddi Electronically signed by Lucetta Russel Mallipeddi Signature Date/Time: 07/04/2023/1:32:41 PM    Final    DG Chest Port 1 View Result Date: 07/02/2023 CLINICAL DATA:  SOB, Afib EXAM: PORTABLE CHEST - 1 VIEW COMPARISON:  02/11/2022. FINDINGS: Cardiac silhouette is prominent. There is pulmonary interstitial prominence with vascular congestion. No focal consolidation. No pneumothorax or pleural effusion identified. IMPRESSION: Findings suggest CHF. Electronically  Signed   By: Sydell Eva M.D.   On: 07/02/2023 23:45      Rosena Conradi, MD  [facility] 07/04/2023  If 7PM-7AM, please contact night-coverage

## 2023-07-05 DIAGNOSIS — I4891 Unspecified atrial fibrillation: Secondary | ICD-10-CM | POA: Diagnosis not present

## 2023-07-05 LAB — CBC
HCT: 44.2 % (ref 39.0–52.0)
Hemoglobin: 14.1 g/dL (ref 13.0–17.0)
MCH: 29.1 pg (ref 26.0–34.0)
MCHC: 31.9 g/dL (ref 30.0–36.0)
MCV: 91.3 fL (ref 80.0–100.0)
Platelets: 239 10*3/uL (ref 150–400)
RBC: 4.84 MIL/uL (ref 4.22–5.81)
RDW: 13.6 % (ref 11.5–15.5)
WBC: 8.3 10*3/uL (ref 4.0–10.5)
nRBC: 0 % (ref 0.0–0.2)

## 2023-07-05 LAB — BASIC METABOLIC PANEL WITH GFR
Anion gap: 7 (ref 5–15)
BUN: 26 mg/dL — ABNORMAL HIGH (ref 8–23)
CO2: 23 mmol/L (ref 22–32)
Calcium: 8.3 mg/dL — ABNORMAL LOW (ref 8.9–10.3)
Chloride: 109 mmol/L (ref 98–111)
Creatinine, Ser: 1.65 mg/dL — ABNORMAL HIGH (ref 0.61–1.24)
GFR, Estimated: 44 mL/min — ABNORMAL LOW (ref >60)
Glucose, Bld: 130 mg/dL — ABNORMAL HIGH (ref 70–99)
Potassium: 4.1 mmol/L (ref 3.5–5.1)
Sodium: 139 mmol/L (ref 135–145)

## 2023-07-05 LAB — GLUCOSE, CAPILLARY
Glucose-Capillary: 156 mg/dL — ABNORMAL HIGH (ref 70–99)
Glucose-Capillary: 138 mg/dL — ABNORMAL HIGH (ref 70–99)
Glucose-Capillary: 97 mg/dL (ref 70–99)
Glucose-Capillary: 120 mg/dL — ABNORMAL HIGH (ref 70–99)

## 2023-07-05 LAB — MAGNESIUM: Magnesium: 2.2 mg/dL (ref 1.7–2.4)

## 2023-07-05 NOTE — Progress Notes (Addendum)
 Patient Name: Thomas [name] Date of Encounter: 07/05/2023 [facility] Cardiologist: Jann Melody, MD   Interval Summary  .    Patient reports feeling well, states his shortness of breath is at baseline Wears CPAP at night, on 2 L of oxygen  via nasal cannula currently with SpO2 95% Reports escalation of heart rate with minimal movement Patient states that his wife brought his Mounjaro  from home that he took yesterday 5/18  Vital Signs .    Vitals:   07/05/23 0008 07/05/23 0512 07/05/23 0514 07/05/23 0745  BP: 123/75  137/60 112/84  Pulse: 94 68 89 (!) 115  Resp: 18  18 18   Temp: 98.1 F (36.7 C) 98.7 F (37.1 C) 98.7 F (37.1 C) 98.2 F (36.8 C)  TempSrc: Oral  Oral Oral  SpO2: 98% 98% 96% 94%  Weight:  (!) 138.5 kg    Height:        Intake/Output Summary (Last 24 hours) at 07/05/2023 0844 Last data filed at 07/05/2023 0500 Gross per 24 hour  Intake 600 ml  Output 550 ml  Net 50 ml      07/05/2023    5:12 AM 07/04/2023    5:10 AM 07/03/2023    2:14 PM  Last 3 Weights  Weight (lbs) 305 lb 5.4 oz 303 lb 9.6 oz 301 lb 11.2 oz  Weight (kg) 138.5 kg 137.712 kg 136.85 kg        Telemetry/ECG .    Atrial fibrillation, HR 110-120s (before medication), PVCs- Personally Reviewed  Physical Exam .   GEN: No acute distress, on 2 L of oxygen  via nasal cannula.   Neck: No JVD Cardiac: Irregular irregular rhythm, tachycardic, no murmurs, rubs, or gallops.  Respiratory: Decreased breath sounds bilaterally. GI: Soft, nontender, non-distended  MS: No edema  Assessment & Plan .   73 y.o. male  with a history of paroxysmal atrial fibrillation, CHFmrEF, DVT and PE, type 2 diabetes, obstructive sleep apnea, prostate cancer and urinary retention with a chronic suprapubic catheter, hypertension who is being seen for atrial fibrillation with rapid ventricular rates    Atrial fibrillation with RVR Minimally symptomatic, noncompliant with Xarelto  Continue  Xarelto  20 mg daily Continue Lopressor  50 mg BID Consider repeat cardioversion as an outpatient after anticoagulated appropriately, per EP HR elevated to 110-120s this morning, first day of BID dosing for Lopressor , patient just given medication will reevaluate heart rate with this dose before adjusting accordingly  HFmrEF Pulmonary hypertension Hypertension Echo this admission showed: EF 40 to 45%, moderately reduced RV function BNP 418 CXR showed pulmonary vascular congestion Given IV Lasix  40 mg x 1 dose, urine output of 2 L Creatinine stable at 1.65, baseline believed to be ~1.55 Home meds: Carvedilol  12.5 mg BID, Jardiance  10 mg daily, Lasix  20 mg daily, hydralazine  25 mg TID, losartan  50 mg daily, spironolactone  50 mg daily, amlodipine  10 mg daily BP stable, normotensive since admission, most recent 112/84 Patient denies shortness of breath outside of baseline Continue Lopressor  50 mg BID, possibly adjust dose as above Gradually add back GDMT pending BP  Nonobstructive CAD Hyperlipidemia, goal LDL < 100 Cath from 06/2017 showed: Nonobstructive CAD, moderate to severe pulmonary hypertension 11/03/2022: HDL 46; LDL Chol Calc (NIH) 48 05/24/2023: ALT 14  Continue Lipitor 40 mg daily Continue Lopressor  50 mg BID Not currently on ASA while on anticoagulation  Per primary History of DVT, PE AKI on CKD stage IIIb Chronic respiratory failure Presumed UTI Type 2 diabetes Urine retention,  BPH OSA on CPAP Constipation   For questions or updates, please contact [facility] Please consult [email or web address] for contact info under        Signed, Jiles Mote, PA-C   Personally seen and examined. Agree with above.  A-fib with RVR -Converted to p.o. metoprolol  50 twice a day.  Heart rates currently around 100.  EF 45 to 50%  Continue Xarelto   Once on Xarelto  for 3 weeks straight could consider outpatient cardioversion.  Dorothye Gathers, MD

## 2023-07-05 NOTE — Plan of Care (Signed)
   Problem: Education: Goal: Ability to describe self-care measures that may prevent or decrease complications (Diabetes Survival Skills Education) will improve Outcome: Progressing Goal: Individualized Educational Video(s) Outcome: Progressing   Problem: Coping: Goal: Ability to adjust to condition or change in health will improve Outcome: Progressing   Problem: Fluid Volume: Goal: Ability to maintain a balanced intake and output will improve Outcome: Progressing   Problem: Health Behavior/Discharge Planning: Goal: Ability to identify and utilize available resources and services will improve Outcome: Progressing Goal: Ability to manage health-related needs will improve Outcome: Progressing   Problem: Metabolic: Goal: Ability to maintain appropriate glucose levels will improve Outcome: Progressing   Problem: Nutritional: Goal: Maintenance of adequate nutrition will improve Outcome: Progressing Goal: Progress toward achieving an optimal weight will improve Outcome: Progressing   Problem: Skin Integrity: Goal: Risk for impaired skin integrity will decrease Outcome: Progressing   Problem: Tissue Perfusion: Goal: Adequacy of tissue perfusion will improve Outcome: Progressing   Problem: Education: Goal: Knowledge of General Education information will improve Description: Including pain rating scale, medication(s)/side effects and non-pharmacologic comfort measures Outcome: Progressing   Problem: Health Behavior/Discharge Planning: Goal: Ability to manage health-related needs will improve Outcome: Progressing   Problem: Clinical Measurements: Goal: Ability to maintain clinical measurements within normal limits will improve Outcome: Progressing Goal: Will remain free from infection Outcome: Progressing Goal: Diagnostic test results will improve Outcome: Progressing Goal: Respiratory complications will improve Outcome: Progressing Goal: Cardiovascular complication will  be avoided Outcome: Progressing   Problem: Activity: Goal: Risk for activity intolerance will decrease Outcome: Progressing   Problem: Nutrition: Goal: Adequate nutrition will be maintained Outcome: Progressing   Problem: Coping: Goal: Level of anxiety will decrease Outcome: Progressing   Problem: Elimination: Goal: Will not experience complications related to bowel motility Outcome: Progressing Goal: Will not experience complications related to urinary retention Outcome: Progressing   Problem: Pain Managment: Goal: General experience of comfort will improve and/or be controlled Outcome: Progressing   Problem: Safety: Goal: Ability to remain free from injury will improve Outcome: Progressing   Problem: Skin Integrity: Goal: Risk for impaired skin integrity will decrease Outcome: Progressing   Problem: Education: Goal: Knowledge of disease or condition will improve Outcome: Progressing Goal: Understanding of medication regimen will improve Outcome: Progressing Goal: Individualized Educational Video(s) Outcome: Progressing   Problem: Activity: Goal: Ability to tolerate increased activity will improve Outcome: Progressing   Problem: Cardiac: Goal: Ability to achieve and maintain adequate cardiopulmonary perfusion will improve Outcome: Progressing   Problem: Health Behavior/Discharge Planning: Goal: Ability to safely manage health-related needs after discharge will improve Outcome: Progressing

## 2023-07-05 NOTE — Progress Notes (Signed)
 PROGRESS NOTE  Thomas [name] [id number] DOB: 1950-04-25 DOA: 07/02/2023 PCP: Verma Gobble, NP   LOS: 1 day   Brief narrative:   Thomas [name] is a 73 y.o. male with medical history significant of PA, history of DVT/PE on xarelto , HFmrEF, T2DM, HTN, OSA, hx of prostate cancer, urinary retention with chronic suprapubic catheter, CAD, pulmonary HTN presented to hospital with cloudy urine.  He went to see his PCP on the day of presentation and was given Augmentin  but when he went home he checked his heart rate and oxygen  which was noted to be abnormal with heart rate in the 130s and had some palpitations came to the hospital for further evaluation and treatment.  In the ED, heart rate was 148.  Labs were notable for creatinine of 1.9.  Troponin 25 followed by 26.  Chest x-ray showed pulmonary vascular congestion.  Patient was started on Cardizem  drip and was admitted hospital for further evaluation and treatment.     Assessment/Plan: Principal Problem:   Atrial fibrillation with RVR (HCC) Active Problems:   Acute renal failure superimposed on stage 3b chronic kidney disease (HCC)   Heart failure with mildly reduced ejection fraction (HFmrEF) (HCC)   Chronic respiratory failure with hypoxia (HCC)   presumed UTI   Type 2 diabetes mellitus with chronic kidney disease, without long-term current use of insulin  (HCC)   Urine retention   HTN (hypertension)   Hyperlipidemia with target LDL less than 100   CAD (coronary artery disease)   Benign prostatic hyperplasia with urinary obstruction   Sleep apnea   Atrial fibrillation (HCC)  Atrial fibrillation with RVR  Minimally symptomatic.  Patient was initially on Cardizem  gtt, coreg .   TSH of 1.0. CHA2DS2-VASc of 3.  Cardiology on board and currently Coreg  has been changed to metoprolol  twice daily.  Cardizem  drip has been discontinued.  Continue Xarelto  for anticoagulation.  Follow cardiology recommendation.  Still RVR   Acute  renal failure superimposed on stage 3b chronic kidney disease Creatinine of creatinine of 1.6.  Baseline appears to be around 1.3-1.5.    Urinalysis with 6-10 white cells.  Monitor BMP   Heart failure with mildly reduced ejection fraction (HFmrEF)  Review of previous 2D echo 04/2022: LV EF of 45-50%, mildly decreased LV function, grade 1 DD. Moderately elevated pulmonary arterial pressure.  Received 1 dose of IV Lasix .  Continue strict intake and output charting.  Repeat 2D echocardiogram during this hospitalization shows LV ejection fraction of 40 to 45% with global hypokinesis.  Cardiology on board.  Patient is negative balance for 1061 mL   Chronic respiratory failure with hypoxia  On  nasal cannula oxygen .  Follows up with pulmonary as outpatient.  presumed UTI Seen by PCP and was started on Augmentin .  Urinalysis with 6-10 white cells.  Urine culture showing Proteus Mirabilis.  Continue Augmentin .  Continue to hold  Type 2 diabetes mellitus with chronic kidney disease, without long-term current use of insulin   Review of latest hemoglobin A1C of 5.5 in April of 2025.  Continue to hold metformin  and GLP-1. Continue sliding scale insulin , Accu-Cheks diabetic diet.  Latest POC glucose of 138.   Urine retention On suprapubic catheter.  Will continue.   HTN (hypertension) Patient is on hydralazine , losartan , norvasc , lasix  and coreg  at home.  Changed to metoprolol .  Hold losartan .  Received 1 dose of IV Lasix .    Hyperlipidemia with target LDL less than 100 Continue lipitor    CAD (coronary artery disease) -  Minimal non-obstructive disease by cardiac catheterization in May 2019.  Continue aspirin  and statins beta-blocker.   Benign prostatic hyperplasia with urinary obstruction Continue suprapubic catheter    Sleep apnea Continue cpap   Class III obesity.Body mass index is 45.09 kg/m.  Would benefit from lifestyle modification and weight loss as outpatient   DVT prophylaxis:   rivaroxaban  (XARELTO ) tablet 20 mg   Disposition: Home likely in 1 to 2 days  Status is: Observation  The patient is inpatient because: Atrial fibrillation with RVR, cardiology consultation, closer monitoring,    Code Status:     Code Status: Full Code  Family Communication: Spoke with the patient's spouse at bedside on 07/04/2023  Consultants: Cardiology  Procedures: None  Anti-infectives:  Augmentin   Anti-infectives (From admission, onward)    Start     Dose/Rate Route Frequency Ordered Stop   07/03/23 1000  amoxicillin -clavulanate (AUGMENTIN ) 875-125 MG per tablet 1 tablet        1 tablet Oral 2 times daily 07/03/23 0809          Subjective: Today, patient was seen and examined at bedside.  Patient denies any chest pain, dizziness, lightheadedness, nausea, vomiting.  Denies any fever, chills or rigor.  Objective: Vitals:   07/05/23 0745 07/05/23 1216  BP: 112/84 109/85  Pulse: (!) 115 (!) 104  Resp: 18 16  Temp: 98.2 F (36.8 C) 98 F (36.7 C)  SpO2: 94% 94%    Intake/Output Summary (Last 24 hours) at 07/05/2023 1302 Last data filed at 07/05/2023 0500 Gross per 24 hour  Intake --  Output 550 ml  Net -550 ml   Filed Weights   07/03/23 1414 07/04/23 0510 07/05/23 0512  Weight: (!) 136.9 kg (!) 137.7 kg (!) 138.5 kg   Body mass index is 45.09 kg/m.   Physical Exam:  GENERAL: Patient is alert awake and oriented. Not in obvious distress.  Obese built HENT: No scleral pallor or icterus. Pupils equally reactive to light. Oral mucosa is moist NECK: is supple, no gross swelling noted. CHEST: Clear to auscultation. No crackles or wheezes.  Diminished breath sounds bilaterally. CVS: S1 and S2 heard irregular rhythm. ABDOMEN: Soft, non-tender, bowel sounds are present.  Suprapubic catheter in place EXTREMITIES: No edema. CNS: Cranial nerves are intact. No focal motor deficits. SKIN: warm and dry without rashes.  Data Review: I have personally reviewed  the following laboratory data and studies,  CBC: Recent Labs  Lab 07/02/23 2309 07/04/23 0455 07/05/23 0546  WBC 10.8* 8.0 8.3  HGB 14.8 14.4 14.1  HCT 46.5 44.2 44.2  MCV 90.8 88.8 91.3  PLT 300 256 239   Basic Metabolic Panel: Recent Labs  Lab 07/02/23 2309 07/04/23 0455 07/05/23 0546  NA 141 139 139  K 4.1 4.2 4.1  CL 107 107 109  CO2 20* 25 23  GLUCOSE 156* 129* 130*  BUN 27* 22 26*  CREATININE 1.95* 1.66* 1.65*  CALCIUM  9.1 8.3* 8.3*  MG 2.1  --  2.2   Liver Function Tests: No results for input(s): "AST", "ALT", "ALKPHOS", "BILITOT", "PROT", "ALBUMIN" in the last 168 hours. No results for input(s): "LIPASE", "AMYLASE" in the last 168 hours. No results for input(s): "AMMONIA" in the last 168 hours. Cardiac Enzymes: No results for input(s): "CKTOTAL", "CKMB", "CKMBINDEX", "TROPONINI" in the last 168 hours. BNP (last 3 results) Recent Labs    07/03/23 1726  BNP 418.8*    ProBNP (last 3 results) Recent Labs    11/03/22 1000  PROBNP 659*  CBG: Recent Labs  Lab 07/04/23 1224 07/04/23 1651 07/04/23 2116 07/05/23 0743 07/05/23 1215  GLUCAP 141* 113* 126* 156* 138*   Recent Results (from the past 240 hours)  Urine Culture     Status: None   Collection Time: 07/02/23  1:28 PM   Specimen: Urine  Result Value Ref Range Status   MICRO NUMBER: [id number]  Final   SPECIMEN QUALITY: Adequate  Final   Sample Source URINE, CLEAN CATCH  Final   STATUS: FINAL  Final   Result:   Final    Mixed genital flora isolated. These superficial bacteria are not indicative of a urinary tract infection. No further organism identification is warranted on this specimen. If clinically indicated, recollect clean-catch, mid-stream urine and transfer  immediately to Urine Culture Transport Tube.   Urine Culture (for pregnant, neutropenic or urologic patients or patients with an indwelling urinary catheter)     Status: Abnormal (Preliminary result)   Collection Time: 07/03/23   2:16 PM   Specimen: Urine, Suprapubic  Result Value Ref Range Status   Specimen Description URINE, SUPRAPUBIC  Final   Special Requests NONE  Final   Culture (A)  Final    10,000 COLONIES/mL PROTEUS MIRABILIS CULTURE REINCUBATED FOR BETTER GROWTH Performed at [facility], [address], [place], [place] [address]    Report Status PENDING  Incomplete   Organism ID, Bacteria PROTEUS MIRABILIS (A)  Final      Susceptibility   Proteus mirabilis - MIC*    AMPICILLIN <=2 SENSITIVE Sensitive     CEFAZOLIN  <=4 SENSITIVE Sensitive     CEFEPIME <=0.12 SENSITIVE Sensitive     CEFTRIAXONE  <=0.25 SENSITIVE Sensitive     CIPROFLOXACIN  <=0.25 SENSITIVE Sensitive     GENTAMICIN  <=1 SENSITIVE Sensitive     IMIPENEM 4 SENSITIVE Sensitive     NITROFURANTOIN  128 RESISTANT Resistant     TRIMETH /SULFA  <=20 SENSITIVE Sensitive     AMPICILLIN/SULBACTAM <=2 SENSITIVE Sensitive     PIP/TAZO <=4 SENSITIVE Sensitive ug/mL    * 10,000 COLONIES/mL PROTEUS MIRABILIS     Studies: ECHOCARDIOGRAM COMPLETE Result Date: 07/04/2023    ECHOCARDIOGRAM REPORT   Patient Name:   SHUBH CHIARA [facility] Date of Exam: 07/04/2023 Medical Rec #:  [id number]            Height:       69.0 in Accession #:    [id number]           Weight:       303.6 lb Date of Birth:  Jan 17, 1951            BSA:          2.467 m Patient Age:    73 years             BP:           114/74 mmHg Patient Gender: M                    HR:           85 bpm. Exam Location:  Inpatient Procedure: 2D Echo, Cardiac Doppler, Color Doppler and Intracardiac            Opacification Agent (Both Spectral and Color Flow Doppler were            utilized during procedure). Indications:    Atrial fibrillation  History:        Patient has prior history of Echocardiogram examinations, most  recent 05/13/2022. Risk Factors:Hypertension.  Sonographer:    Janette Medley Referring Phys: [id number] ALLISON WOLFE  Sonographer Comments: Technically difficult study due to  poor echo windows. IMPRESSIONS  1. Poor Echo images despite Definity .  2. Left ventricular ejection fraction, by estimation, is 40 to 45%. The left ventricle has mildly decreased function. The left ventricle demonstrates global hypokinesis. Left ventricular diastolic function could not be evaluated.  3. Right ventricular systolic function is moderately reduced. The right ventricular size is normal.  4. The mitral valve is grossly normal. No evidence of mitral valve regurgitation. No evidence of mitral stenosis.  5. The aortic valve was not well visualized. Aortic valve regurgitation is not visualized. No aortic stenosis is present. Comparison(s): Changes from prior study are noted. LVEF worsened from 45-50% to 40-45%. FINDINGS  Left Ventricle: Left ventricular ejection fraction, by estimation, is 40 to 45%. The left ventricle has mildly decreased function. The left ventricle demonstrates global hypokinesis. Strain was performed and the global longitudinal strain is indeterminate. The left ventricular internal cavity size was normal in size. There is no left ventricular hypertrophy. Left ventricular diastolic function could not be evaluated due to atrial fibrillation. Left ventricular diastolic function could not be  evaluated. Right Ventricle: The right ventricular size is normal. No increase in right ventricular wall thickness. Right ventricular systolic function is moderately reduced. Left Atrium: Left atrial size was normal in size. Right Atrium: Right atrial size was normal in size. Pericardium: There is no evidence of pericardial effusion. Mitral Valve: The mitral valve is grossly normal. No evidence of mitral valve regurgitation. No evidence of mitral valve stenosis. Tricuspid Valve: The tricuspid valve is not well visualized. Tricuspid valve regurgitation is not demonstrated. No evidence of tricuspid stenosis. Aortic Valve: The aortic valve was not well visualized. Aortic valve regurgitation is not  visualized. No aortic stenosis is present. Pulmonic Valve: The pulmonic valve was not well visualized. Pulmonic valve regurgitation is trivial. No evidence of pulmonic stenosis. Aorta: The aortic root is normal in size and structure. Venous: The inferior vena cava was not well visualized. IAS/Shunts: No atrial level shunt detected by color flow Doppler. Additional Comments: 3D was performed not requiring image post processing on an independent workstation and was indeterminate.  LEFT VENTRICLE PLAX 2D LVIDd:         5.00 cm   Diastology LVIDs:         3.80 cm   LV e' lateral: 9.36 cm/s LV PW:         1.00 cm LV IVS:        1.50 cm LVOT diam:     2.20 cm LV SV:         54 LV SV Index:   22 LVOT Area:     3.80 cm  RIGHT VENTRICLE            IVC RV S prime:     9.57 cm/s  IVC diam: 2.40 cm TAPSE (M-mode): 2.4 cm LEFT ATRIUM             Index        RIGHT ATRIUM           Index LA diam:        4.50 cm 1.82 cm/m   RA Area:     19.10 cm LA Vol (A2C):   61.1 ml 24.77 ml/m  RA Volume:   57.60 ml  23.35 ml/m LA Vol (A4C):   37.8 ml 15.32 ml/m LA Biplane Vol: 48.3  ml 19.58 ml/m  AORTIC VALVE LVOT Vmax:   83.00 cm/s LVOT Vmean:  53.000 cm/s LVOT VTI:    0.141 m  AORTA Ao Root diam: 2.50 cm Ao Asc diam:  3.70 cm  SHUNTS Systemic VTI:  0.14 m Systemic Diam: 2.20 cm Vishnu Priya Mallipeddi Electronically signed by Lucetta Russel Mallipeddi Signature Date/Time: 07/04/2023/1:32:41 PM    Final       Rosena Conradi, MD  [facility] 07/05/2023  If 7PM-7AM, please contact night-coverage

## 2023-07-06 ENCOUNTER — Other Ambulatory Visit (HOSPITAL_COMMUNITY): Payer: Self-pay

## 2023-07-06 ENCOUNTER — Telehealth (HOSPITAL_COMMUNITY): Payer: Self-pay | Admitting: Pharmacy Technician

## 2023-07-06 ENCOUNTER — Other Ambulatory Visit: Payer: Self-pay

## 2023-07-06 ENCOUNTER — Telehealth: Payer: Self-pay

## 2023-07-06 ENCOUNTER — Ambulatory Visit: Payer: Self-pay | Admitting: Adult Health

## 2023-07-06 DIAGNOSIS — I4891 Unspecified atrial fibrillation: Secondary | ICD-10-CM | POA: Diagnosis not present

## 2023-07-06 LAB — CBC
HCT: 46.1 % (ref 39.0–52.0)
Hemoglobin: 15 g/dL (ref 13.0–17.0)
MCH: 29.3 pg (ref 26.0–34.0)
MCHC: 32.5 g/dL (ref 30.0–36.0)
MCV: 90 fL (ref 80.0–100.0)
Platelets: 252 10*3/uL (ref 150–400)
RBC: 5.12 MIL/uL (ref 4.22–5.81)
RDW: 13.5 % (ref 11.5–15.5)
WBC: 7.6 10*3/uL (ref 4.0–10.5)
nRBC: 0 % (ref 0.0–0.2)

## 2023-07-06 LAB — GLUCOSE, CAPILLARY: Glucose-Capillary: 110 mg/dL — ABNORMAL HIGH (ref 70–99)

## 2023-07-06 LAB — BASIC METABOLIC PANEL WITH GFR
Anion gap: 10 (ref 5–15)
BUN: 27 mg/dL — ABNORMAL HIGH (ref 8–23)
CO2: 22 mmol/L (ref 22–32)
Calcium: 8.6 mg/dL — ABNORMAL LOW (ref 8.9–10.3)
Chloride: 109 mmol/L (ref 98–111)
Creatinine, Ser: 1.58 mg/dL — ABNORMAL HIGH (ref 0.61–1.24)
GFR, Estimated: 46 mL/min — ABNORMAL LOW (ref >60)
Glucose, Bld: 123 mg/dL — ABNORMAL HIGH (ref 70–99)
Potassium: 4.4 mmol/L (ref 3.5–5.1)
Sodium: 141 mmol/L (ref 135–145)

## 2023-07-06 MED ORDER — METOPROLOL TARTRATE 75 MG PO TABS
75.0000 mg | ORAL_TABLET | Freq: Two times a day (BID) | ORAL | 2 refills | Status: DC
  Filled 2023-07-06: qty 60, 30d supply, fill #0

## 2023-07-06 MED ORDER — RIVAROXABAN 20 MG PO TABS
20.0000 mg | ORAL_TABLET | Freq: Every day | ORAL | 12 refills | Status: DC
  Filled 2023-07-06: qty 30, 30d supply, fill #0
  Filled 2023-08-02: qty 30, 30d supply, fill #1
  Filled 2023-09-07: qty 30, 30d supply, fill #2
  Filled 2023-10-11: qty 30, 30d supply, fill #3
  Filled 2023-10-12: qty 30, 30d supply, fill #0
  Filled 2023-11-23: qty 30, 30d supply, fill #1

## 2023-07-06 MED ORDER — METOPROLOL TARTRATE 25 MG PO TABS
75.0000 mg | ORAL_TABLET | Freq: Two times a day (BID) | ORAL | Status: DC
  Administered 2023-07-06: 75 mg via ORAL
  Filled 2023-07-06: qty 1

## 2023-07-06 NOTE — Progress Notes (Signed)
-     Was prescribed Augmentin  during visit on 07/02/2023 -   Urine culture showed mixed genital flora which is due to possible contamination during specimen collection -   Is he still having burning since patient and urine still cloudy, any improvement?

## 2023-07-06 NOTE — TOC Initial Note (Addendum)
 Transition of Care [facility]) - Initial/Assessment Note    Patient Details  Name: Thomas [name] MRN: [id number] Date of Birth: [birth year]  [facility] CM/SW Contact:    Cosimo Diones, RN Phone Number: 07/06/2023, 11:45 AM  Clinical Narrative: Patient presented for Atrial Fib. PTA patient was from home with spouse. Spouse was in the room at the time of the visit. Patient is active with [employer] for DME oxygen  2 Liters. Patient did not have a portable tank for travel. Case Manager did call [employer] to deliver the portable tank to the discharge lounge. No home health needs identified during the visit. Spouse to provide transportation home.                   1248 07-06-23 Case Manager called [employer] and the patients oxygen  was picked up in October of last year. Case Manager discussed with patient and now he expresses that he has been using his wife's concentrator. Patient continues on 2 liters in the hospital- an ambulatory sat was obtained to qualify the patient and new orders added to epic to arrange for home oxygen . Patient will wait for DME in the discharge lounge. No further needs identified.  Expected Discharge Plan: Home/Self Care Barriers to Discharge: No Barriers Identified   Patient Goals and CMS Choice Patient states their goals for this hospitalization and ongoing recovery are:: Plan for discharge home.  Expected Discharge Plan and Services In-house Referral: NA Discharge Planning Services: CM Consult Post Acute Care Choice: NA Living arrangements for the past 2 months: Single Family Home Expected Discharge Date: 07/06/23                 DME Agency: NA  Prior Living Arrangements/Services Living arrangements for the past 2 months: Single Family Home Lives with:: Spouse Patient language and need for interpreter reviewed:: Yes Do you feel safe going back to the place where you live?: Yes      Need for Family Participation in Patient Care: Yes  (Comment) Care giver support system in place?: Yes (comment) Current home services: DME (oxygen ) Criminal Activity/Legal Involvement Pertinent to Current Situation/Hospitalization: No - Comment as needed  Activities of Daily Living   ADL Screening (condition at time of admission) Independently performs ADLs?: Yes (appropriate for developmental age) Is the patient deaf or have difficulty hearing?: No Does the patient have difficulty seeing, even when wearing glasses/contacts?: No Does the patient have difficulty concentrating, remembering, or making decisions?: No  Permission Sought/Granted Permission sought to share information with : Family Supports, Case Production designer, theatre/television/film, Photographer granted to share info w AGENCY: [employer]        Emotional Assessment Appearance:: Appears stated age Attitude/Demeanor/Rapport: Engaged Affect (typically observed): Appropriate Orientation: : Oriented to Self, Oriented to Place, Oriented to  Time, Oriented to Situation Alcohol  / Substance Use: Not Applicable    Admission diagnosis:  Atrial fibrillation with RVR (HCC) [I48.91] Atrial fibrillation, rapid (HCC) [I48.91] Chronic kidney disease, unspecified CKD stage [N18.9] Atrial fibrillation (HCC) [I48.91] Patient Active Problem List   Diagnosis Date Noted   Atrial fibrillation (HCC) 07/04/2023   Atrial fibrillation with RVR (HCC) 07/03/2023   presumed UTI 07/03/2023   Acute renal failure superimposed on stage 3b chronic kidney disease (HCC) 07/03/2023   CAD (coronary artery disease) 11/02/2022   Dyspnea 10/19/2017   Chronic respiratory failure with hypoxia (HCC) 10/19/2017   Anticoagulated 07/27/2017   Pulmonary hypertension, unspecified (HCC)    Chest  pain 06/01/2017   New onset atrial fibrillation (HCC) 06/01/2017   CKD (chronic kidney disease), stage III (HCC) 06/01/2017   Heart failure with mildly reduced ejection fraction (HFmrEF) (HCC) 06/01/2017    Paroxysmal atrial fibrillation (HCC) 05/31/2017   Total knee replacement status 11/19/2016   Meniscal injury 10/09/2016   Genetic testing 06/22/2016   DVT, lower extremity, proximal, acute, left (HCC) 01/23/2016   Hematuria 12/12/2015   Type 2 diabetes mellitus with chronic kidney disease, without long-term current use of insulin  (HCC) 12/05/2014   Benign prostatic hyperplasia with urinary obstruction 07/25/2014   Urine retention 07/25/2014   Urinary tract infection 07/09/2014   Anemia, iron  deficiency 11/22/2013   Urinary retention 11/01/2013   Incomplete emptying of bladder 07/26/2013   Nocturia 07/26/2013   Other nonspecific finding on examination of urine 07/26/2013   Sleep apnea 07/26/2013   Urethral stricture 07/26/2013   Bladder neck contracture 07/04/2013   Severe obesity (BMI >= 40) (HCC) 05/23/2013   B12 deficiency 05/23/2013   Hyperlipidemia with target LDL less than 100 09/02/2012   HTN (hypertension) 05/26/2012   Osteoarthritis, knee 05/26/2012   MORBID OBESITY 08/22/2008   PCP:  Verma Gobble, NP Pharmacy:   [facility] [address] [place] [address] Phone: [phone] Fax: [phone]  [facility] - [place], [place] - [address] [place] [address] Phone: [phone] Fax: [phone]  Social Drivers of Health (SDOH) Social History: SDOH Screenings   Food Insecurity: Food Insecurity Present (07/03/2023)  Housing: Low Risk  (07/03/2023)  Transportation Needs: No Transportation Needs (07/03/2023)  Utilities: Not At Risk (07/03/2023)  Depression (PHQ2-9): Low Risk  (05/27/2023)  Recent Concern: Depression (PHQ2-9) - Medium Risk (04/27/2023)  Financial Resource Strain: Medium Risk (12/09/2017)  Physical Activity: Inactive (12/09/2017)  Social Connections: Moderately Isolated (07/03/2023)  Stress: No Stress Concern Present (12/09/2017)  Tobacco Use: Low  Risk  (07/02/2023)     Readmission Risk Interventions     No data to display

## 2023-07-06 NOTE — Progress Notes (Addendum)
 Patient Name: Thomas [name] Date of Encounter: 07/06/2023 [facility] Cardiologist: Jann Melody, MD   Interval Summary  .    Patient doing well. HR remains a bit elevated -- increasing metoprolol   He reports that he needs his suprapubic catheter changed, scheduled to get it done as outpatient 5/21 9:30 AM -- I have let his primary team know so they can arrange   Vital Signs .    Vitals:   07/05/23 2152 07/05/23 2326 07/06/23 0457 07/06/23 0729  BP:  134/78 (!) 140/85 124/72  Pulse: 63 83 87   Resp: 19 16 19 17   Temp:  98 F (36.7 C) 97.6 F (36.4 C) 98.2 F (36.8 C)  TempSrc:  Oral Axillary Oral  SpO2: 98% 98% 94%   Weight:   (!) 136.7 kg   Height:       Intake/Output Summary (Last 24 hours) at 07/06/2023 0836 Last data filed at 07/06/2023 0457 Gross per 24 hour  Intake 900 ml  Output 1000 ml  Net -100 ml      07/06/2023    4:57 AM 07/05/2023    5:12 AM 07/04/2023    5:10 AM  Last 3 Weights  Weight (lbs) 301 lb 5.9 oz 305 lb 5.4 oz 303 lb 9.6 oz  Weight (kg) 136.7 kg 138.5 kg 137.712 kg      Telemetry/ECG .    Atrial fibrillation, HR 100s, PVCs - Personally Reviewed  Physical Exam .   GEN: No acute distress, on room air  Neck: No JVD Cardiac: irregularly irregular rhythm, no murmurs, rubs, or gallops.  Respiratory: decreased breath sounds bilaterally. GI: Soft, nontender, non-distended  MS: No edema  Assessment & Plan .   73 y.o. male  with a history of paroxysmal atrial fibrillation, CHFmrEF, DVT and PE, type 2 diabetes, obstructive sleep apnea, prostate cancer and urinary retention with a chronic suprapubic catheter, hypertension who is being seen for atrial fibrillation with rapid ventricular rates     Atrial fibrillation with RVR Minimally symptomatic, noncompliant with Xarelto  HR remained elevated, will increase BB today Continue Xarelto  20 mg daily   Increased Lopressor  to 75 mg BID Consider repeat cardioversion as an  outpatient after anticoagulated for 3 weeks, per EP   HFmrEF Pulmonary hypertension Hypertension Echo this admission showed: EF 40 to 45%, moderately reduced RV function BNP 418 CXR showed pulmonary vascular congestion Given IV Lasix  40 mg x 1 dose, urine output of 2 L Home meds: Carvedilol  12.5 mg BID, Jardiance  10 mg daily, Lasix  20 mg daily, hydralazine  25 mg TID, losartan  50 mg daily, spironolactone  50 mg daily, amlodipine  10 mg daily Awaiting morning labs  Net - 1.1 L this admission  Increased BB as above Gradually add back GDMT pending BP   Nonobstructive CAD Hyperlipidemia, goal LDL < 100 Cath from 06/2017 showed: Nonobstructive CAD, moderate to severe pulmonary hypertension 11/03/2022: HDL 46; LDL Chol Calc (NIH) 48 05/24/2023: ALT 14  Continue Lipitor 40 mg daily Continue Lopressor  75 mg BID Not currently on ASA while on anticoagulation   Per primary History of DVT, PE AKI on CKD stage IIIb Chronic respiratory failure Presumed UTI Type 2 diabetes Urine retention, BPH OSA on CPAP Constipation    For questions or updates, please contact [facility] Please consult [email or web address] for contact info under        Signed, Jiles Mote, PA-C   Personally seen and examined. Agree with above.  Atrial fibrillation under better rate control.  Metoprolol  has been increased to 75 mg twice a day.  I think he can be discharged with this dosage.  I would not any problems with his discharge today.  He has an appointment with urology to get a suprapubic catheter changed tomorrow at 9:30 AM.  Continue with Xarelto  20 mg, metoprolol  75 mg twice a day, consider repeat cardioversion once anticoagulated for 3 weeks See as outpatient.  Dorothye Gathers, MD

## 2023-07-06 NOTE — Progress Notes (Addendum)
 SATURATION QUALIFICATIONS:  Patient Saturations on Room Air at Rest = 94%  Patient Saturations on Room Air while Ambulating = 83%  Patient Saturations on 2 Liters of oxygen  while Ambulating = 92%

## 2023-07-06 NOTE — Progress Notes (Signed)
 Noted. We can discuss if repeat UA with CS needed during the follow up and by then antibiotic prescribed should have been completed.

## 2023-07-06 NOTE — Progress Notes (Signed)
 Complex Care Management Note Care Guide Note  07/06/2023 Name: Thomas [name] MRN: [id number] DOB: 1950-09-12  Thomas [name] is a 73 y.o. year old male who is a primary care patient of Verma Gobble, NP . The community resource team was consulted for assistance with Financial Difficulties related to CPAP supplies.   SDOH screenings and interventions completed:  Yes  Social Drivers of Health From This Encounter   Housing: Patient Declined (07/06/2023)   Housing Stability Vital Sign    Unable to Pay for Housing in the Last Year: Patient declined    Number of Times Moved in the Last Year: 0    Homeless in the Last Year: Patient declined  Financial Resource Strain: Patient Declined (07/06/2023)   Overall Financial Resource Strain (CARDIA)    Difficulty of Paying Living Expenses: Patient declined  Transportation Needs: Patient Declined (07/06/2023)   PRAPARE - Administrator, Civil Service (Medical): Patient declined    Lack of Transportation (Non-Medical): Patient declined  Utilities: Patient Declined (07/06/2023)   Utilities    Threatened with loss of utilities: Patient declined    SDOH Interventions Today    Flowsheet Row Most Recent Value  SDOH Interventions   Food Insecurity Interventions Other (Comment)  [Mailed patient Editor, commissioning list.]  Housing Interventions Patient Declined  Transportation Interventions Patient Declined  Utilities Interventions Patient Declined  Financial Strain Interventions Patient Declined        Care guide performed the following interventions: Spoke with patient about possible assistance from the [employer] Sleep Apnea-CPAP Mask Yearly Supply Program, Medicare Extrahelp and [employer] payment plan program. Verified home address to mail information.  Follow Up Plan:  No further follow up planned at this time. The patient has been provided with needed resources.  Encounter Outcome:  Patient Visit  Completed  Morgen Linebaugh [facility]  [facility] Guide Direct Dial: [phone]  Fax: [phone] Website: [email or web address]

## 2023-07-06 NOTE — Telephone Encounter (Signed)
 Patient Product/process development scientist completed.    The patient is insured through [employer]. Patient has Medicare and is not eligible for a copay card, but may be able to apply for patient assistance or Medicare RX Payment Plan (Patient Must reach out to their plan, if eligible for payment plan), if available.    Ran test claim for Xarelto  20 mg and the current 30 day co-pay is $47.00.   This test claim was processed through [facility]- copay amounts may vary at other pharmacies due to pharmacy/plan contracts, or as the patient moves through the different stages of their insurance plan.     Morgan Arab, CPHT Pharmacy Technician III Certified Patient Advocate [facility] Patient Advocate Team Direct Number: [phone]  Fax: [phone]

## 2023-07-06 NOTE — Discharge Summary (Signed)
 Physician Discharge Summary  Thomas [name]:[id number] DOB: 1950-06-08 DOA: 07/02/2023  PCP: Verma Gobble, NP  Admit date: 07/02/2023 Discharge date: 07/06/2023  Admitted From: Home  Discharge disposition: Home   Recommendations for Outpatient Follow-Up:   Follow up with your primary care provider in one week.  Check CBC, BMP, magnesium  in the next visit Follow-up with cardiology as outpatient in the atrial fibrillation clinic.  Might need outpatient cardioversion.  Needs compliance with anticoagulation.  Office to schedule an appointment. Follow-up with urology as outpatient for suprapubic catheter exchange which has been scheduled for 07/07/2023.   Discharge Diagnosis:   Principal Problem:   Atrial fibrillation with RVR (HCC) Active Problems:   Acute renal failure superimposed on stage 3b chronic kidney disease (HCC)   Heart failure with mildly reduced ejection fraction (HFmrEF) (HCC)   Chronic respiratory failure with hypoxia (HCC)   presumed UTI   Type 2 diabetes mellitus with chronic kidney disease, without long-term current use of insulin  (HCC)   Urine retention   HTN (hypertension)   Hyperlipidemia with target LDL less than 100   CAD (coronary artery disease)   Benign prostatic hyperplasia with urinary obstruction   Sleep apnea   Atrial fibrillation (HCC)   Discharge Condition: Improved.  Diet recommendation: Low sodium, heart healthy.    Wound care: None.  Code status: Full.   History of Present Illness:   Thomas [name] is a 73 y.o. male with medical history significant of PA, history of DVT/PE on xarelto , HFmrEF, T2DM, HTN, OSA, hx of prostate cancer, urinary retention with chronic suprapubic catheter, CAD, pulmonary HTN presented to hospital with cloudy urine.  He went to see his PCP on the day of presentation and was given Augmentin  but when he went home he checked his heart rate and oxygen  which was noted to be abnormal with heart  rate in the 130s and had some palpitations came to the hospital for further evaluation and treatment.  In the ED, heart rate was 148.  Labs were notable for creatinine of 1.9.  Troponin 25 followed by 26.  Chest x-ray showed pulmonary vascular congestion.  Patient was started on Cardizem  drip and was admitted hospital for further evaluation and treatment.     Hospital Course:   Following conditions were addressed during hospitalization as listed below,  Atrial fibrillation with RVR  Minimally symptomatic.  Patient was initially on Cardizem  gtt, coreg .   TSH of 1.0. CHA2DS2-VASc of 3.  Cardiology was consulted and Coreg  was changed to metoprolol  75 mg twice daily at this time.   Continue Xarelto  for anticoagulation.  Patient might need outpatient cardioversion after good anticoagulation compliance.  Compliance with Xarelto  was emphasized.     Acute renal failure superimposed on stage 3b chronic kidney disease Creatinine of creatinine of 1.6.  Baseline appears to be around 1.3-1.5.    Urinalysis with 6-10 white cells.  Monitor BMP   Heart failure with mildly reduced ejection fraction (HFmrEF)  Review of previous 2D echo 04/2022: LV EF of 45-50%, mildly decreased LV function, grade 1 DD. Moderately elevated pulmonary arterial pressure.  Repeat 2D echocardiogram during this hospitalization shows LV ejection fraction of 40 to 45% with global hypokinesis.  At this time patient will be continued on Coreg .  Appears compensated.  Will follow-up with cardiology as outpatient.    Chronic respiratory failure with hypoxia  On  nasal cannula oxygen  at home..  Follows up with pulmonary as outpatient.  Patient has qualified for home  oxygen  2 L/min on discharge.   presumed UTI Seen by PCP and was started on Augmentin .  Urinalysis with 6-10 white cells.  Urine culture showing Proteus Mirabilis.  Patient received Augmentin  during hospitalization to complete the course.   Type 2 diabetes mellitus with chronic  kidney disease, without long-term current use of insulin   Review of latest hemoglobin A1C of 5.5 in April of 2025.  Patient with resume metformin  and GLP-1.   Urine retention On suprapubic catheter.  For exchange 07/07/2023 HTN (hypertension) Patient is on hydralazine , losartan , norvasc , lasix  and coreg  at home.  Changed Coreg  to metoprolol .     Hyperlipidemia with target LDL less than 100 Continue lipitor    CAD (coronary artery disease) -Minimal non-obstructive disease by cardiac catheterization in May 2019.  Continue aspirin  and statins beta-blocker.   Benign prostatic hyperplasia with urinary obstruction Continue suprapubic catheter.  Plan for exchange tomorrow on 07/07/2023   Sleep apnea Continue cpap    Class III obesity.Body mass index is 45.09 kg/m.  Would benefit from lifestyle modification and weight loss as outpatient   Disposition.  At this time, patient is stable for disposition home with outpatient PCP and cardiology follow-up.  Communicated with patient's family at bedside.  Medical Consultants:   Cardiology  Procedures:    2D echocardiogram Subjective:   Today, patient was seen and examined at bedside.  Denies any pain, nausea, vomiting, shortness of breath, palpitations or chest pain.  Discharge Exam:   Vitals:   07/06/23 0457 07/06/23 0729  BP: (!) 140/85 124/72  Pulse: 87   Resp: 19 17  Temp: 97.6 F (36.4 C) 98.2 F (36.8 C)  SpO2: 94%    Vitals:   07/05/23 2152 07/05/23 2326 07/06/23 0457 07/06/23 0729  BP:  134/78 (!) 140/85 124/72  Pulse: 63 83 87   Resp: 19 16 19 17   Temp:  98 F (36.7 C) 97.6 F (36.4 C) 98.2 F (36.8 C)  TempSrc:  Oral Axillary Oral  SpO2: 98% 98% 94%   Weight:   (!) 136.7 kg   Height:       Body mass index is 44.5 kg/m.  General: Alert awake, not in obvious distress, obese built HENT: pupils equally reacting to light,  No scleral pallor or icterus noted. Oral mucosa is moist.  Chest:  Clear breath sounds.  .  No crackles or wheezes.  CVS: S1 &S2 heard. No murmur.  Regular rate and rhythm.  Mild tachycardia with irregular rhythm.. Abdomen: Soft, nontender, nondistended.  Bowel sounds are heard.  Suprapubic catheter in place Extremities: No cyanosis, clubbing or edema.  Peripheral pulses are palpable. Psych: Alert, awake and oriented, normal mood CNS:  No cranial nerve deficits.  Power equal in all extremities.   Skin: Warm and dry.  No rashes noted.  The results of significant diagnostics from this hospitalization (including imaging, microbiology, ancillary and laboratory) are listed below for reference.     Diagnostic Studies:   DG Chest Port 1 View Result Date: 07/02/2023 CLINICAL DATA:  SOB, Afib EXAM: PORTABLE CHEST - 1 VIEW COMPARISON:  02/11/2022. FINDINGS: Cardiac silhouette is prominent. There is pulmonary interstitial prominence with vascular congestion. No focal consolidation. No pneumothorax or pleural effusion identified. IMPRESSION: Findings suggest CHF. Electronically Signed   By: Sydell Eva M.D.   On: 07/02/2023 23:45     Labs:   Basic Metabolic Panel: Recent Labs  Lab 07/02/23 2309 07/04/23 0455 07/05/23 0546 07/06/23 0955  NA 141 139 139 141  K  4.1 4.2 4.1 4.4  CL 107 107 109 109  CO2 20* 25 23 22   GLUCOSE 156* 129* 130* 123*  BUN 27* 22 26* 27*  CREATININE 1.95* 1.66* 1.65* 1.58*  CALCIUM  9.1 8.3* 8.3* 8.6*  MG 2.1  --  2.2  --    GFR Estimated Creatinine Clearance: 57.2 mL/min (A) (by C-G formula based on SCr of 1.58 mg/dL (H)). Liver Function Tests: No results for input(s): "AST", "ALT", "ALKPHOS", "BILITOT", "PROT", "ALBUMIN" in the last 168 hours. No results for input(s): "LIPASE", "AMYLASE" in the last 168 hours. No results for input(s): "AMMONIA" in the last 168 hours. Coagulation profile Recent Labs  Lab 07/03/23 1839  INR 1.1    CBC: Recent Labs  Lab 07/02/23 2309 07/04/23 0455 07/05/23 0546 07/06/23 0955  WBC 10.8* 8.0 8.3 7.6  HGB  14.8 14.4 14.1 15.0  HCT 46.5 44.2 44.2 46.1  MCV 90.8 88.8 91.3 90.0  PLT 300 256 239 252   Cardiac Enzymes: No results for input(s): "CKTOTAL", "CKMB", "CKMBINDEX", "TROPONINI" in the last 168 hours. BNP: Invalid input(s): "POCBNP" CBG: Recent Labs  Lab 07/05/23 0743 07/05/23 1215 07/05/23 1613 07/05/23 2108 07/06/23 0728  GLUCAP 156* 138* 97 120* 110*   D-Dimer No results for input(s): "DDIMER" in the last 72 hours. Hgb A1c No results for input(s): "HGBA1C" in the last 72 hours. Lipid Profile No results for input(s): "CHOL", "HDL", "LDLCALC", "TRIG", "CHOLHDL", "LDLDIRECT" in the last 72 hours. Thyroid  function studies Recent Labs    07/03/23 1730  TSH 1.058   Anemia work up No results for input(s): "VITAMINB12", "FOLATE", "FERRITIN", "TIBC", "IRON ", "RETICCTPCT" in the last 72 hours. Microbiology Recent Results (from the past 240 hours)  Urine Culture     Status: None   Collection Time: 07/02/23  1:28 PM   Specimen: Urine  Result Value Ref Range Status   MICRO NUMBER: [id number]  Final   SPECIMEN QUALITY: Adequate  Final   Sample Source URINE, CLEAN CATCH  Final   STATUS: FINAL  Final   Result:   Final    Mixed genital flora isolated. These superficial bacteria are not indicative of a urinary tract infection. No further organism identification is warranted on this specimen. If clinically indicated, recollect clean-catch, mid-stream urine and transfer  immediately to Urine Culture Transport Tube.   Urine Culture (for pregnant, neutropenic or urologic patients or patients with an indwelling urinary catheter)     Status: Abnormal (Preliminary result)   Collection Time: 07/03/23  2:16 PM   Specimen: Urine, Suprapubic  Result Value Ref Range Status   Specimen Description URINE, SUPRAPUBIC  Final   Special Requests NONE  Final   Culture (A)  Final    10,000 COLONIES/mL PROTEUS MIRABILIS CULTURE REINCUBATED FOR BETTER GROWTH Performed at [facility],  [address] [place], [place] [address]    Report Status PENDING  Incomplete   Organism ID, Bacteria PROTEUS MIRABILIS (A)  Final      Susceptibility   Proteus mirabilis - MIC*    AMPICILLIN <=2 SENSITIVE Sensitive     CEFAZOLIN  <=4 SENSITIVE Sensitive     CEFEPIME <=0.12 SENSITIVE Sensitive     CEFTRIAXONE  <=0.25 SENSITIVE Sensitive     CIPROFLOXACIN  <=0.25 SENSITIVE Sensitive     GENTAMICIN  <=1 SENSITIVE Sensitive     IMIPENEM 4 SENSITIVE Sensitive     NITROFURANTOIN  128 RESISTANT Resistant     TRIMETH /SULFA  <=20 SENSITIVE Sensitive     AMPICILLIN/SULBACTAM <=2 SENSITIVE Sensitive  PIP/TAZO <=4 SENSITIVE Sensitive ug/mL    * 10,000 COLONIES/mL PROTEUS MIRABILIS     Discharge Instructions:   Discharge Instructions     Amb referral to AFIB Clinic   Complete by: As directed    Call MD for:  difficulty breathing, headache or visual disturbances   Complete by: As directed    Call MD for:  severe uncontrolled pain   Complete by: As directed    Call MD for:  temperature >100.4   Complete by: As directed    Diet - low sodium heart healthy   Complete by: As directed    Discharge instructions   Complete by: As directed    Follow up with your primary care provider in one week.  Check blood work at that time.  Follow-up with cardiology as outpatient in the atrial fibrillation clinic,   Seek medical attention for worsening symptoms.  Exchange catheter as has been scheduled.   Increase activity slowly   Complete by: As directed       Allergies as of 07/06/2023       Reactions   Lisinopril  Cough, Other (See Comments)   Muscle pain        Medication List     STOP taking these medications    carvedilol  12.5 MG tablet Commonly known as: COREG        TAKE these medications    Accu-Chek Aviva Plus test strip Generic drug: glucose blood Use to test blood sugar daily.   Accu-Chek Aviva Soln Use once daily as directed dx E11.22   Accu-Chek Softclix Lancet Dev  Kit Use to test blood sugar daily   Accu-Chek Softclix Lancets lancets Use to test blood sugar daily   TRUEplus Lancets 28G Misc TEST BLOOD SUGAR EVERY DAY AS NEEDED   amLODipine  5 MG tablet Commonly known as: NORVASC  Take 1 tablet (5 mg total) by mouth daily. What changed: when to take this   amoxicillin -clavulanate 875-125 MG tablet Commonly known as: AUGMENTIN  Take 1 tablet by mouth 2 (two) times daily for 7 days.   aspirin  EC 81 MG tablet Take 81 mg by mouth daily.   atorvastatin  40 MG tablet Commonly known as: LIPITOR TAKE 1 TABLET EVERY DAY What changed: additional instructions   B-D SINGLE USE SWABS REGULAR Pads Use in testing blood sugar daily. Dx: E11.22   calcium  carbonate 500 MG chewable tablet Commonly known as: TUMS - dosed in mg elemental calcium  Chew 2 tablets by mouth as needed for indigestion or heartburn.   empagliflozin  10 MG Tabs tablet Commonly known as: JARDIANCE  Take 1 tablet (10 mg total) by mouth daily.   furosemide  20 MG tablet Commonly known as: LASIX  TAKE 1 TABLET AS DIRECTED AS NEEDED What changed: See the new instructions.   hydrALAZINE  25 MG tablet Commonly known as: APRESOLINE  TAKE 3 TABLETS THREE TIMES DAILY What changed: See the new instructions.   losartan  50 MG tablet Commonly known as: COZAAR  TAKE 1 TABLET EVERY DAY What changed: when to take this   metFORMIN  500 MG tablet Commonly known as: GLUCOPHAGE  TAKE 1 TABLET TWICE DAILY WITH MEALS   Metoprolol  Tartrate 75 MG Tabs Take 1 tablet (75 mg total) by mouth 2 (two) times daily.   Mounjaro  15 MG/0.5ML Pen Generic drug: tirzepatide  Inject 15 mg into the skin once a week. What changed: additional instructions   MULTIVITAMIN ADULT PO Take 2 each by mouth as needed.   potassium chloride  SA 20 MEQ tablet Commonly known as: KLOR-CON  M TAKE 1  TABLET TWICE DAILY What changed: when to take this   rivaroxaban  20 MG Tabs tablet Commonly known as: XARELTO  Take 1 tablet  (20 mg total) by mouth daily with supper.   Senna 8.7 MG Chew Chew 1 tablet by mouth as needed.   spironolactone  25 MG tablet Commonly known as: ALDACTONE  TAKE 1 TABLET EVERY DAY What changed:  how much to take when to take this   traMADol  50 MG tablet Commonly known as: ULTRAM  Take 1 tablet (50 mg total) by mouth every 6 (six) hours as needed for pain   True Metrix Air Glucose Meter w/Device Kit 1 Device by Does not apply route daily as needed. E11.22   Vitamin D3 50 MCG (2000 UT) Tabs Take 1 tablet (2,000 Units) by mouth daily. What changed: when to take this   zinc  gluconate 50 MG tablet Take 1 tablet (50 mg total) by mouth daily.               Durable Medical Equipment  (From admission, onward)           Start     Ordered   07/06/23 1249  For home use only DME oxygen   Once       Question Answer Comment  Length of Need Lifetime   Mode or (Route) Nasal cannula   Liters per Minute 2   Frequency Continuous (stationary and portable oxygen  unit needed)   Oxygen  conserving device Yes   Oxygen  delivery system Gas      07/06/23 1248            Follow-up Information     Verma Gobble, NP Follow up in 1 week(s).   Specialty: Geriatric Medicine Contact information: [address] [place] [address] [phone]         [facility] Follow up.   Why: Oxygen  to be delivered home. Contact information: [address] [place] [address] [phone]                  Time coordinating discharge: 39 minutes  Signed:  Kalonji Zurawski  [facility] 07/06/2023, 2:00 PM

## 2023-07-07 ENCOUNTER — Telehealth: Payer: Self-pay

## 2023-07-07 DIAGNOSIS — R339 Retention of urine, unspecified: Secondary | ICD-10-CM | POA: Diagnosis not present

## 2023-07-07 DIAGNOSIS — Z466 Encounter for fitting and adjustment of urinary device: Secondary | ICD-10-CM | POA: Diagnosis not present

## 2023-07-07 NOTE — Transitions of Care (Post Inpatient/ED Visit) (Signed)
   07/07/2023  Name: Thomas [name] MRN: [id number] DOB: 1950/09/23  Today's TOC FU Call Status: Today's TOC FU Call Status:: Unsuccessful Call (1st Attempt) Unsuccessful Call (1st Attempt) Date: 07/07/23  Attempted to reach the patient regarding the most recent Inpatient/ED visit.  Follow Up Plan: Additional outreach attempts will be made to reach the patient to complete the Transitions of Care (Post Inpatient/ED visit) call.   Tonia Frankel RN, CCM [place]  VBCI-Population Health RN Care Manager [phone]

## 2023-07-08 ENCOUNTER — Telehealth: Payer: Self-pay

## 2023-07-08 LAB — URINE CULTURE: Culture: 10000 — AB

## 2023-07-08 NOTE — Transitions of Care (Post Inpatient/ED Visit) (Signed)
   07/08/2023  Name: NOX TALENT MRN: [id number] DOB: 12-Dec-1950  Today's TOC FU Call Status: Today's TOC FU Call Status:: Unsuccessful Call (2nd Attempt) Unsuccessful Call (2nd Attempt) Date: 07/08/23  Attempted to reach the patient regarding the most recent Inpatient/ED visit.  Follow Up Plan: Additional outreach attempts will be made to reach the patient to complete the Transitions of Care (Post Inpatient/ED visit) call.   Tonia Frankel RN, CCM [place]  VBCI-Population Health RN Care Manager [phone]

## 2023-07-09 ENCOUNTER — Telehealth: Payer: Self-pay

## 2023-07-09 NOTE — Transitions of Care (Post Inpatient/ED Visit) (Signed)
 07/09/2023  Name: Thomas [name] MRN: [id number] DOB: [birth year]  Today's TOC FU Call Status: Today's TOC FU Call Status:: Successful TOC FU Call Completed TOC FU Call Complete Date: 07/09/23 Patient's Name and Date of Birth confirmed.  Transition Care Management Follow-up Telephone Call How have you been since you were released from the hospital?: Same (Patient states he still has shortness of breath and dry mouth - reports he has oxygen  at 3liters and O2 saturation is 99% HR 66) Any questions or concerns?: No  Items Reviewed: Did you receive and understand the discharge instructions provided?: Yes Medications obtained,verified, and reconciled?: Yes (Medications Reviewed) Any new allergies since your discharge?: No Dietary orders reviewed?: Yes Type of Diet Ordered:: Low sodium, heart healthy Do you have support at home?: Yes People in Home [RPT]: spouse Name of Support/Comfort Primary Source: Wife assists as needed  Medications Reviewed Today: Medications Reviewed Today     Reviewed by Sharmaine Dearth, RN (Registered Nurse) on 07/09/23 at 1246  Med List Status: <None>   Medication Order Taking? Sig Documenting Provider Last Dose Status Informant  Accu-Chek Softclix Lancets lancets [id number] Yes Use to test blood sugar daily Verma Gobble, NP Taking Active Self, Pharmacy Records  Alcohol  Swabs (B-D SINGLE USE SWABS REGULAR) PADS [id number] Yes Use in testing blood sugar daily. Dx: E11.22 Verma Gobble, NP Taking Active Self, Pharmacy Records  amLODipine  (NORVASC ) 5 MG tablet [id number] Yes Take 1 tablet (5 mg total) by mouth daily.  Patient taking differently: Take 5 mg by mouth in the morning.   Verma Gobble, NP Taking Active Self, Pharmacy Records           Med Note Karolynn Pack   GEX Jul 03, 2023  2:32 PM) LF: 05/24/23 for a 90ds  amoxicillin -clavulanate (AUGMENTIN ) 875-125 MG tablet [id number] Yes Take 1 tablet by mouth 2 (two) times daily for 7  days. Medina-Vargas, Monina C, NP Taking Active Self, Pharmacy Records           Med Note Karolynn Pack   WNU Jul 03, 2023  2:35 PM) LF: 07/02/23 for a 7ds  aspirin  EC 81 MG tablet [id number] Yes Take 81 mg by mouth daily. [provider] Taking Active Self, Pharmacy Records  atorvastatin  (LIPITOR) 40 MG tablet [id number] Yes TAKE 1 TABLET EVERY DAY  Patient taking differently: Take 40 mg by mouth daily. Patient is taking daily in the evening   Roselie Conger, Jessica K, NP Taking Active Self, Pharmacy Records           Med Note Karolynn Pack   GLO Jul 03, 2023  2:32 PM) LF: 05/13/23 for a 90ds  Blood Glucose Calibration (ACCU-CHEK AVIVA) SOLN [id number] Yes Use once daily as directed dx E11.22 Verma Gobble, NP Taking Active Self, Pharmacy Records  Blood Glucose Monitoring Suppl (TRUE METRIX AIR GLUCOSE METER) w/Device KIT [id number] Yes 1 Device by Does not apply route daily as needed. E11.22 Verma Gobble, NP Taking Active Self, Pharmacy Records  calcium  carbonate (TUMS - DOSED IN MG ELEMENTAL CALCIUM ) 500 MG chewable tablet [id number] Yes Chew 2 tablets by mouth as needed for indigestion or heartburn. [provider] Taking Active Self, Pharmacy Records  Cholecalciferol  (VITAMIN D3) 50 MCG (2000 UT) TABS [id number] Yes Take 1 tablet (2,000 Units) by mouth daily.  Patient taking differently: Take 2,000 Units by mouth in the morning.    Taking Active Self, Pharmacy Records  empagliflozin  (JARDIANCE ) 10 MG TABS tablet [id number]  Yes Take 1 tablet (10 mg total) by mouth daily. Verma Gobble, NP Taking Active Self, Pharmacy Records  furosemide  (LASIX ) 20 MG tablet [id number] Yes TAKE 1 TABLET AS DIRECTED AS NEEDED  Patient taking differently: Take 20 mg by mouth in the morning.   Gabino Joe, PA-C Taking Active Self, Pharmacy Records           Med Note Karolynn Pack   WGN Jul 03, 2023  2:33 PM) LF: 05/13/23 for a 90ds  glucose blood (ACCU-CHEK AVIVA  PLUS) test strip [id number] Yes Use to test blood sugar daily. Verma Gobble, NP Taking Active Self, Pharmacy Records  hydrALAZINE  (APRESOLINE ) 25 MG tablet [id number] Yes TAKE 3 TABLETS THREE TIMES DAILY  Patient taking differently: Take 75 mg by mouth 3 (three) times daily.   Verma Gobble, NP Taking Active Self, Pharmacy Records           Med Note Karolynn Pack   MWU Jul 03, 2023  2:33 PM) LF: 04/29/23 for a 90ds  Lancets Misc. (ACCU-CHEK SOFTCLIX LANCET DEV) KIT [id number] Yes Use to test blood sugar daily Verma Gobble, NP Taking Active Self, Pharmacy Records  losartan  (COZAAR ) 50 MG tablet [id number] Yes TAKE 1 TABLET EVERY DAY  Patient taking differently: Take 50 mg by mouth in the morning.   Verma Gobble, NP Taking Active Self, Pharmacy Records           Med Note Karolynn Pack   HKV Jul 03, 2023  2:34 PM) LF: 04/04/23 for a 90ds  metFORMIN  (GLUCOPHAGE ) 500 MG tablet [id number] Yes TAKE 1 TABLET TWICE DAILY WITH MEALS [name], Jessica K, NP Taking Active Self, Pharmacy Records           Med Note Karolynn Pack   FIE Jul 03, 2023  2:33 PM) LF: 04/29/23 for a 90ds  Metoprolol  Tartrate 75 MG TABS [id number] Yes Take 1 tablet (75 mg total) by mouth 2 (two) times daily. Pokhrel, Laxman, MD Taking Active   Multiple Vitamin (MULTIVITAMIN ADULT PO) [id number] Yes Take 2 each by mouth as needed. [provider] Taking Active Self, Pharmacy Records  potassium chloride  SA (KLOR-CON  M) 20 MEQ tablet [id number] Yes TAKE 1 TABLET TWICE DAILY  Patient taking differently: Take 20 mEq by mouth in the morning.   Verma Gobble, NP Taking Active Self, Pharmacy Records           Med Note Karolynn Pack   WFU Jul 03, 2023  3:11 PM) LF: 04/07/23 for a 90ds  rivaroxaban  (XARELTO ) 20 MG TABS tablet [id number] Yes Take 1 tablet (20 mg total) by mouth daily with supper. Pokhrel, Laxman, MD Taking Active   Senna 8.7 MG CHEW [id number] Yes Chew 1 tablet by  mouth as needed. Verma Gobble, NP Taking Active Self, Pharmacy Records  spironolactone  (ALDACTONE ) 25 MG tablet [id number] Yes TAKE 1 TABLET EVERY DAY  Patient taking differently: Take 50 mg by mouth in the morning.   Lucendia Rusk, MD Taking Active Self, Pharmacy Records           Med Note Karolynn Pack   VVO Jul 03, 2023  2:35 PM) LF: 06/11/23 for a 90ds  tirzepatide  (MOUNJARO ) 15 MG/0.5ML Pen [id number] Yes Inject 15 mg into the skin once a week.  Patient taking differently: Inject 15 mg into the skin once a week. On Sundays   Jann Melody, MD Taking Active Self, Pharmacy Records  Med Note Karolynn Pack   ZOX Jul 03, 2023  2:35 PM) LF: 06/07/23 for a 28ds  traMADol  (ULTRAM ) 50 MG tablet [id number] No Take 1 tablet (50 mg total) by mouth every 6 (six) hours as needed for pain  Patient not taking: Reported on 07/09/2023   Verma Gobble, NP Not Taking Active Self, Pharmacy Records           Med Note Karolynn Pack   WJX Jul 03, 2023  2:36 PM) LF: 03/02/23 for a 8ds  TRUEplus Lancets 28G MISC [id number] Yes TEST BLOOD SUGAR EVERY DAY AS NEEDED Verma Gobble, NP Taking Active Self, Pharmacy Records  zinc  gluconate 50 MG tablet [id number] Yes Take 1 tablet (50 mg total) by mouth daily. Ngetich, Elijio [place], NP Taking Active Self, Pharmacy Records            Home Care and Equipment/Supplies: Were Home Health Services Ordered?: No Any new equipment or medical supplies ordered?: Yes Name of Medical supply agency?: [employer] - for oxygen  Were you able to get the equipment/medical supplies?: Yes Do you have any questions related to the use of the equipment/supplies?: No  Functional Questionnaire: Do you need assistance with bathing/showering or dressing?: No Do you need assistance with meal preparation?: No Do you need assistance with eating?: No Do you have difficulty maintaining continence: No (Patient has suprapubic catheter  after prostate cancer with seed implant 5 years ago) Do you need assistance with getting out of bed/getting out of a chair/moving?: No Do you have difficulty managing or taking your medications?: Yes (Wife assists as needed)  Follow up appointments reviewed: PCP Follow-up appointment confirmed?: Yes Date of PCP follow-up appointment?: 07/15/23 Follow-up Provider: [facility] Follow-up appointment confirmed?: Yes Date of Specialist follow-up appointment?: 07/20/23 Follow-Up Specialty Provider:: cardiology Do you need transportation to your follow-up appointment?: No Do you understand care options if your condition(s) worsen?: Yes-patient verbalized understanding  SDOH Interventions Today    Flowsheet Row Most Recent Value  SDOH Interventions   Food Insecurity Interventions Intervention Not Indicated  Housing Interventions Intervention Not Indicated  Transportation Interventions Intervention Not Indicated  Utilities Interventions Intervention Not Indicated       Goals Addressed             This Visit's Progress    VBCI Transitions of Care (TOC) Care Plan       Problems:  Recent Hospitalization for treatment of Atrial Fibrillation with RVR Patient reports feeling the same as he did when he left the hospital   Goal:  Over the next 30 days, the patient will not experience hospital readmission  Interventions:  Transitions of Care: Doctor Visits  - discussed the importance of doctor visits  AFIB Interventions:   Reviewed importance of adherence to anticoagulant exactly as prescribed Counseled on bleeding risk associated with Xarelto  and importance of self-monitoring for signs/symptoms of bleeding   Heart Failure Interventions: Provided education on low sodium diet Assessed need for readable accurate scales in home Provided education about placing scale on hard, flat surface Advised patient to weigh each morning after emptying bladder Discussed  importance of daily weight and advised patient to weigh and record daily  Diabetes Interventions: Assessed patient's understanding of A1c goal: <6.5% Reviewed medications with patient and discussed importance of medication adherence Discussed plans with patient for ongoing care management follow up and provided patient with direct contact information for care management team Reviewed scheduled/upcoming provider appointments including: PCP Hospital follow up  07/15/23 Assessed social determinant of health barriers Lab Results  Component Value Date   HGBA1C 5.5 05/24/2023    Patient Self Care Activities:  Attend all scheduled provider appointments Call pharmacy for medication refills 3-7 days in advance of running out of medications Call provider office for new concerns or questions  Notify RN Care Manager of TOC call rescheduling needs Participate in Transition of Care Program/Attend TOC scheduled calls Take medications as prescribed   call office if I gain more than 2 pounds in one day or 5 pounds in one week use salt in moderation watch for swelling in feet, ankles and legs every day weigh myself daily take the blood sugar log to all doctor visits make a plan to eat healthy take medicine as prescribed  Plan:  Telephone follow up appointment with care management team member scheduled for:  07/16/23 2pm  The patient has been provided with contact information for the care management team and has been advised to call with any health related questions or concerns.        Tonia Frankel RN, CCM [place]  VBCI-Population Health RN Care Manager [phone]

## 2023-07-11 ENCOUNTER — Other Ambulatory Visit: Payer: Self-pay | Admitting: Internal Medicine

## 2023-07-13 ENCOUNTER — Other Ambulatory Visit (HOSPITAL_COMMUNITY): Payer: Self-pay

## 2023-07-13 MED ORDER — MOUNJARO 15 MG/0.5ML ~~LOC~~ SOAJ
15.0000 mg | SUBCUTANEOUS | 5 refills | Status: DC
  Filled 2023-07-13: qty 2, 28d supply, fill #0
  Filled 2023-08-11: qty 2, 28d supply, fill #1
  Filled 2023-09-07: qty 2, 28d supply, fill #2
  Filled 2023-10-06: qty 2, 28d supply, fill #3
  Filled 2023-11-10: qty 2, 28d supply, fill #4
  Filled 2023-11-10: qty 2, 28d supply, fill #0
  Filled 2023-12-26: qty 2, 28d supply, fill #1

## 2023-07-14 DIAGNOSIS — R339 Retention of urine, unspecified: Secondary | ICD-10-CM | POA: Diagnosis not present

## 2023-07-15 ENCOUNTER — Ambulatory Visit: Admitting: Adult Health

## 2023-07-15 ENCOUNTER — Encounter: Payer: Self-pay | Admitting: Adult Health

## 2023-07-15 VITALS — BP 100/70 | HR 89 | Temp 98.7°F | Ht 69.0 in | Wt 300.0 lb

## 2023-07-15 DIAGNOSIS — I4891 Unspecified atrial fibrillation: Secondary | ICD-10-CM | POA: Diagnosis not present

## 2023-07-15 DIAGNOSIS — N183 Chronic kidney disease, stage 3 unspecified: Secondary | ICD-10-CM

## 2023-07-15 DIAGNOSIS — N179 Acute kidney failure, unspecified: Secondary | ICD-10-CM

## 2023-07-15 DIAGNOSIS — I2510 Atherosclerotic heart disease of native coronary artery without angina pectoris: Secondary | ICD-10-CM | POA: Diagnosis not present

## 2023-07-15 DIAGNOSIS — I5022 Chronic systolic (congestive) heart failure: Secondary | ICD-10-CM | POA: Diagnosis not present

## 2023-07-15 DIAGNOSIS — I129 Hypertensive chronic kidney disease with stage 1 through stage 4 chronic kidney disease, or unspecified chronic kidney disease: Secondary | ICD-10-CM | POA: Diagnosis not present

## 2023-07-15 DIAGNOSIS — E1122 Type 2 diabetes mellitus with diabetic chronic kidney disease: Secondary | ICD-10-CM | POA: Diagnosis not present

## 2023-07-15 DIAGNOSIS — N1832 Chronic kidney disease, stage 3b: Secondary | ICD-10-CM | POA: Diagnosis not present

## 2023-07-15 DIAGNOSIS — N390 Urinary tract infection, site not specified: Secondary | ICD-10-CM | POA: Diagnosis not present

## 2023-07-15 DIAGNOSIS — E785 Hyperlipidemia, unspecified: Secondary | ICD-10-CM | POA: Diagnosis not present

## 2023-07-15 DIAGNOSIS — G4730 Sleep apnea, unspecified: Secondary | ICD-10-CM

## 2023-07-15 DIAGNOSIS — T83511S Infection and inflammatory reaction due to indwelling urethral catheter, sequela: Secondary | ICD-10-CM

## 2023-07-15 DIAGNOSIS — N400 Enlarged prostate without lower urinary tract symptoms: Secondary | ICD-10-CM

## 2023-07-15 DIAGNOSIS — I5020 Unspecified systolic (congestive) heart failure: Secondary | ICD-10-CM

## 2023-07-15 DIAGNOSIS — I10 Essential (primary) hypertension: Secondary | ICD-10-CM

## 2023-07-15 LAB — POCT URINALYSIS DIPSTICK (MANUAL)
Nitrite, UA: NEGATIVE
Poct Blood: 250 — AB
Poct Glucose: 50 mg/dL — AB
Poct Ketones: NEGATIVE
Poct Urobilinogen: NORMAL mg/dL
Spec Grav, UA: 1.015 (ref 1.010–1.025)
pH, UA: 6 (ref 5.0–8.0)

## 2023-07-15 NOTE — Progress Notes (Signed)
 [facility] clinic  Provider:  Inge Mangle DNP  Code Status:  Full Code  Goals of Care:     07/03/2023    2:20 PM  Advanced Directives  Does Patient Have a Medical Advance Directive? No  Would patient like information on creating a medical advance directive? No - Patient declined     Chief Complaint  Patient presents with   Hospitalization Follow-up    Discussed the use of AI scribe software for clinical note transcription with the patient, who gave verbal consent to proceed  HPI: Patient is a 73 y.o. male seen today for a hospitalization follow up. He was accompanied by his wife.  He was hospitalized from May 16 to Jul 06, 2023, for atrial fibrillation with rapid ventricular response. He experienced a racing heart and was started on metoprolol  for rate control. He had heart rate in the 130s and had some palpitations.  In the ED, heart rate was 148.,  Creatinine 1.9, troponin 25 followed by 26.  Chest x-ray showed pulmonary vascular congestion.  He was restarted on Cardizem  drip.  Cardiology was consulted and Coreg  was changed to metoprolol  75 mg twice a day. He is currently taking Xarelto  20 mg daily for anticoagulation.  During his hospital stay, he was diagnosed with a urinary tract infection, which he believes triggered the atrial fibrillation episode. He completed a seven-day course of antibiotics while hospitalized.   Urine dipstick test today showed moderate leukocytes and large blood, and a urine culture has been sent to rule out ongoing infection.  He experiences shortness of breath, which has worsened recently. He uses oxygen  at 3 liters per minute at home. He has a history of heart failure with mildly reduced ejection fraction. He was previously taking Coreg  (carvedilol ), which was replaced with metoprolol  during his hospital stay.  He has chronic kidney disease stage 3B, with a recent improvement in kidney function to stage 3A. His last kidney function test on May 20  showed a GFR of 46. He also has a history of acute renal failure diagnosed during his recent hospitalization.  He has a history of hypertension, managed with amlodipine  5 mg daily, losartan  50 mg daily, and hydralazine  75 mg three times daily. He reports fluctuations in his blood pressure readings.  He has a history of diabetes, managed with Jardiance  10 mg daily and Mounjaro  15 mg weekly. His last A1c in 11/03/2022 was 5.6. He reports stable weight at 300 lbs and is unable to exercise due to shortness of breath.  He has a suprapubic catheter due to benign prostatic hyperplasia and a history of prostate cancer treated with seeding. The catheter is irrigated twice a month with sterilized water to prevent blockage from sediment.  He has hyperlipidemia, managed with atorvastatin  40 mg daily, and a history of coronary artery disease with a cardiac catheterization in May 2019 showing no blockages. No current chest pain.  He uses a CPAP machine with a full mask for sleep apnea and reports regular use.    Past Medical History:  Diagnosis Date   Acute on chronic diastolic CHF (congestive heart failure) (HCC) 06/01/2017   Arthritis    Cancer (HCC) 2010   Prostate   Chronic kidney disease    Diabetes mellitus    Diabetic neuropathy (HCC)    feet   DVT (deep venous thrombosis) (HCC)    Genetic testing 06/22/2016   Mr. Vences underwent genetic counseling and testing for hereditary cancer syndromes on 05/14/2016. His results were negative  for mutations in all 46 genes analyzed by [employer]'s 46-gene Common Hereditary Cancers Panel. Genes analyzed include: APC, ATM, AXIN2, BARD1, BMPR1A, BRCA1, BRCA2, BRIP1, CDH1, CDKN2A, CHEK2, CTNNA1, DICER1, EPCAM, GREM1, HOXB13, KIT, MEN1, MLH1, MSH2, MSH3, MSH6, MUTYH, NB   GERD (gastroesophageal reflux disease)    Hypertension    PE (pulmonary thromboembolism) (HCC)    Pneumonia    Sleep apnea    not wearing CPAP   Suprapubic catheter (HCC) 11/17/2019    Past  Surgical History:  Procedure Laterality Date   CARDIOVERSION N/A 06/03/2017   Procedure: CARDIOVERSION;  Surgeon: Hugh Madura, MD;  Location: [facility] ENDOS[facility];  Service: Cardiovascular;  Laterality: N/A;   CATARACT EXTRACTION Left 07/31/2021   Dr.Glenn, [facility]   COLONOSCOPY     COLONOSCOPY WITH PROPOFOL  N/A 10/20/2018   Procedure: COLONOSCOPY WITH PROPOFOL ;  Surgeon: Lindle Rhea, MD;  Location: [facility] ENDOSCOPY;  Service: Gastroenterology;  Laterality: N/A;   HERNIA REPAIR     KNEE SURGERY     MULTIPLE TOOTH EXTRACTIONS     POLYPECTOMY  10/20/2018   Procedure: POLYPECTOMY;  Surgeon: Lindle Rhea, MD;  Location: [facility] ENDOSCOPY;  Service: Gastroenterology;;   PROSTATE SURGERY     RADIOACTIVE SEED IMPLANT     RIGHT/LEFT HEART CATH AND CORONARY ANGIOGRAPHY N/A 07/06/2017   Procedure: RIGHT/LEFT HEART CATH AND CORONARY ANGIOGRAPHY;  Surgeon: Lucendia Rusk, MD;  Location: [facility] INVASIVE CV LAB;  Service: Cardiovascular;  Laterality: N/A;   SHOULDER SURGERY     TOTAL KNEE ARTHROPLASTY Right 11/19/2016   Procedure: RIGHT TOTAL KNEE ARTHROPLASTY;  Surgeon: Wes Hamman, MD;  Location: [facility] OR;  Service: Orthopedics;  Laterality: Right;   uretha surgery-2014      Allergies  Allergen Reactions   Lisinopril  Cough and Other (See Comments)    Muscle pain    Outpatient Encounter Medications as of 07/15/2023  Medication Sig   Accu-Chek Softclix Lancets lancets Use to test blood sugar daily   Alcohol  Swabs (B-D SINGLE USE SWABS REGULAR) PADS Use in testing blood sugar daily. Dx: E11.22   amLODipine  (NORVASC ) 5 MG tablet Take 1 tablet (5 mg total) by mouth daily. (Patient taking differently: Take 5 mg by mouth in the morning.)   aspirin  EC 81 MG tablet Take 81 mg by mouth daily.   atorvastatin  (LIPITOR) 40 MG tablet TAKE 1 TABLET EVERY DAY (Patient taking differently: Take 40 mg by mouth daily. Patient is taking daily in the evening)   Blood Glucose Calibration (ACCU-CHEK  AVIVA) SOLN Use once daily as directed dx E11.22   Blood Glucose Monitoring Suppl (TRUE METRIX AIR GLUCOSE METER) w/Device KIT 1 Device by Does not apply route daily as needed. E11.22   calcium  carbonate (TUMS - DOSED IN MG ELEMENTAL CALCIUM ) 500 MG chewable tablet Chew 2 tablets by mouth as needed for indigestion or heartburn.   Cholecalciferol  (VITAMIN D3) 50 MCG (2000 UT) TABS Take 1 tablet (2,000 Units) by mouth daily. (Patient taking differently: Take 2,000 Units by mouth in the morning.)   empagliflozin  (JARDIANCE ) 10 MG TABS tablet Take 1 tablet (10 mg total) by mouth daily.   furosemide  (LASIX ) 20 MG tablet TAKE 1 TABLET AS DIRECTED AS NEEDED (Patient taking differently: Take 20 mg by mouth in the morning.)   glucose blood (ACCU-CHEK AVIVA PLUS) test strip Use to test blood sugar daily.   hydrALAZINE  (APRESOLINE ) 25 MG tablet TAKE 3 TABLETS THREE TIMES DAILY (Patient taking differently: Take 75 mg by mouth 3 (three) times daily.)  losartan  (COZAAR ) 50 MG tablet TAKE 1 TABLET EVERY DAY (Patient taking differently: Take 50 mg by mouth in the morning.)   metFORMIN  (GLUCOPHAGE ) 500 MG tablet TAKE 1 TABLET TWICE DAILY WITH MEALS   Metoprolol  Tartrate 75 MG TABS Take 1 tablet (75 mg total) by mouth 2 (two) times daily.   Multiple Vitamin (MULTIVITAMIN ADULT PO) Take 2 each by mouth as needed.   potassium chloride  SA (KLOR-CON  M) 20 MEQ tablet TAKE 1 TABLET TWICE DAILY (Patient taking differently: Take 20 mEq by mouth in the morning.)   rivaroxaban  (XARELTO ) 20 MG TABS tablet Take 1 tablet (20 mg total) by mouth daily with supper.   Senna 8.7 MG CHEW Chew 1 tablet by mouth as needed.   spironolactone  (ALDACTONE ) 25 MG tablet TAKE 1 TABLET EVERY DAY (Patient taking differently: Take 50 mg by mouth in the morning.)   tirzepatide  (MOUNJARO ) 15 MG/0.5ML Pen Inject 15 mg into the skin once a week.   TRUEplus Lancets 28G MISC TEST BLOOD SUGAR EVERY DAY AS NEEDED   UNABLE TO FIND 500 mg. Med Name:  vitmain C   zinc  gluconate 50 MG tablet Take 1 tablet (50 mg total) by mouth daily.   Lancets Misc. (ACCU-CHEK SOFTCLIX LANCET DEV) KIT Use to test blood sugar daily   traMADol  (ULTRAM ) 50 MG tablet Take 1 tablet (50 mg total) by mouth every 6 (six) hours as needed for pain (Patient not taking: Reported on 07/09/2023)   No facility-administered encounter medications on file as of 07/15/2023.    Review of Systems:  Review of Systems  Constitutional:  Negative for activity change, appetite change and fever.  HENT:  Negative for sore throat.   Eyes: Negative.   Cardiovascular:  Negative for chest pain and leg swelling.  Gastrointestinal:  Negative for abdominal distention, diarrhea and vomiting.  Genitourinary:  Negative for dysuria, frequency and urgency.  Skin:  Negative for color change.  Neurological:  Negative for dizziness and headaches.  Psychiatric/Behavioral:  Negative for behavioral problems and sleep disturbance. The patient is not nervous/anxious.     Health Maintenance  Topic Date Due   DTaP/Tdap/Td (1 - Tdap) Never done   Zoster Vaccines- Shingrix (1 of 2) Never done   COVID-19 Vaccine (5 - 2024-25 season) 10/18/2022   Diabetic kidney evaluation - Urine ACR  06/29/2023   INFLUENZA VACCINE  09/17/2023   Medicare Annual Wellness (AWV)  11/13/2023   HEMOGLOBIN A1C  11/23/2023   FOOT EXAM  05/23/2024   OPHTHALMOLOGY EXAM  05/30/2024   Diabetic kidney evaluation - eGFR measurement  07/14/2024   Colonoscopy  10/19/2028   Pneumonia Vaccine 23+ Years old  Completed   Hepatitis C Screening  Completed   HPV VACCINES  Aged Out   Meningococcal B Vaccine  Aged Out    Physical Exam: Vitals:   07/15/23 1100 07/15/23 1144  BP: 90/60 100/70  Pulse: 89   Temp: 98.7 F (37.1 C)   SpO2: 96%   Weight: 300 lb (136.1 kg)   Height: 5\' 9"  (1.753 m)    Body mass index is 44.3 kg/m. Physical Exam Constitutional:      General: He is not in acute distress.    Appearance: He is  obese.  HENT:     Head: Normocephalic and atraumatic.     Mouth/Throat:     Mouth: Mucous membranes are moist.  Eyes:     Conjunctiva/sclera: Conjunctivae normal.  Cardiovascular:     Rate and Rhythm: Normal rate. Rhythm irregular.  Pulses: Normal pulses.     Heart sounds: Normal heart sounds.  Pulmonary:     Effort: Pulmonary effort is normal.     Breath sounds: Normal breath sounds.  Abdominal:     General: Bowel sounds are normal.     Palpations: Abdomen is soft.  Genitourinary:    Comments: Suprapubic catheter Musculoskeletal:        General: No swelling.     Cervical back: Normal range of motion.  Skin:    General: Skin is warm and dry.  Neurological:     General: No focal deficit present.     Mental Status: He is alert and oriented to person, place, and time.  Psychiatric:        Mood and Affect: Mood normal.        Behavior: Behavior normal.        Thought Content: Thought content normal.        Judgment: Judgment normal.     Labs reviewed: Basic Metabolic Panel: Recent Labs    07/02/23 2309 07/03/23 1730 07/04/23 0455 07/05/23 0546 07/06/23 0955 07/15/23 1206  NA 141  --    < > 139 141 145  K 4.1  --    < > 4.1 4.4 4.5  CL 107  --    < > 109 109 109  CO2 20*  --    < > 23 22 26   GLUCOSE 156*  --    < > 130* 123* 111*  BUN 27*  --    < > 26* 27* 27*  CREATININE 1.95*  --    < > 1.65* 1.58* 1.77*  CALCIUM  9.1  --    < > 8.3* 8.6* 8.8  MG 2.1  --   --  2.2  --  2.0  TSH  --  1.058  --   --   --   --    < > = values in this interval not displayed.   Liver Function Tests: Recent Labs    11/03/22 1000 05/24/23 1034  AST 21 12  ALT 36 14  ALKPHOS 94  --   BILITOT 0.6 0.6  PROT 7.3 7.4  ALBUMIN 4.2  --    No results for input(s): "LIPASE", "AMYLASE" in the last 8760 hours. No results for input(s): "AMMONIA" in the last 8760 hours. CBC: Recent Labs    05/24/23 1034 07/02/23 2309 07/05/23 0546 07/06/23 0955 07/15/23 1206  WBC 9.7   < >  8.3 7.6 9.9  NEUTROABS 7,430  --   --   --  7,999*  HGB 14.2   < > 14.1 15.0 13.2  HCT 44.2   < > 44.2 46.1 41.3  MCV 88.9   < > 91.3 90.0 90.6  PLT 281   < > 239 252 273   < > = values in this interval not displayed.   Lipid Panel: Recent Labs    11/03/22 1000  CHOL 109  HDL 46  LDLCALC 48  TRIG 75  CHOLHDL 2.4   Lab Results  Component Value Date   HGBA1C 5.5 05/24/2023    Procedures since last visit: ECHOCARDIOGRAM COMPLETE Result Date: 07/04/2023    ECHOCARDIOGRAM REPORT   Patient Name:   JAYZON TARAS [facility] Date of Exam: 07/04/2023 Medical Rec #:  [id number]            Height:       69.0 in Accession #:    [id number]  Weight:       303.6 lb Date of Birth:  08/29/1950            BSA:          2.467 m Patient Age:    73 years             BP:           114/74 mmHg Patient Gender: M                    HR:           85 bpm. Exam Location:  Inpatient Procedure: 2D Echo, Cardiac Doppler, Color Doppler and Intracardiac            Opacification Agent (Both Spectral and Color Flow Doppler were            utilized during procedure). Indications:    Atrial fibrillation  History:        Patient has prior history of Echocardiogram examinations, most                 recent 05/13/2022. Risk Factors:Hypertension.  Sonographer:    Janette Medley Referring Phys: [id number] ALLISON WOLFE  Sonographer Comments: Technically difficult study due to poor echo windows. IMPRESSIONS  1. Poor Echo images despite Definity .  2. Left ventricular ejection fraction, by estimation, is 40 to 45%. The left ventricle has mildly decreased function. The left ventricle demonstrates global hypokinesis. Left ventricular diastolic function could not be evaluated.  3. Right ventricular systolic function is moderately reduced. The right ventricular size is normal.  4. The mitral valve is grossly normal. No evidence of mitral valve regurgitation. No evidence of mitral stenosis.  5. The aortic valve was not well visualized. Aortic  valve regurgitation is not visualized. No aortic stenosis is present. Comparison(s): Changes from prior study are noted. LVEF worsened from 45-50% to 40-45%. FINDINGS  Left Ventricle: Left ventricular ejection fraction, by estimation, is 40 to 45%. The left ventricle has mildly decreased function. The left ventricle demonstrates global hypokinesis. Strain was performed and the global longitudinal strain is indeterminate. The left ventricular internal cavity size was normal in size. There is no left ventricular hypertrophy. Left ventricular diastolic function could not be evaluated due to atrial fibrillation. Left ventricular diastolic function could not be  evaluated. Right Ventricle: The right ventricular size is normal. No increase in right ventricular wall thickness. Right ventricular systolic function is moderately reduced. Left Atrium: Left atrial size was normal in size. Right Atrium: Right atrial size was normal in size. Pericardium: There is no evidence of pericardial effusion. Mitral Valve: The mitral valve is grossly normal. No evidence of mitral valve regurgitation. No evidence of mitral valve stenosis. Tricuspid Valve: The tricuspid valve is not well visualized. Tricuspid valve regurgitation is not demonstrated. No evidence of tricuspid stenosis. Aortic Valve: The aortic valve was not well visualized. Aortic valve regurgitation is not visualized. No aortic stenosis is present. Pulmonic Valve: The pulmonic valve was not well visualized. Pulmonic valve regurgitation is trivial. No evidence of pulmonic stenosis. Aorta: The aortic root is normal in size and structure. Venous: The inferior vena cava was not well visualized. IAS/Shunts: No atrial level shunt detected by color flow Doppler. Additional Comments: 3D was performed not requiring image post processing on an independent workstation and was indeterminate.  LEFT VENTRICLE PLAX 2D LVIDd:         5.00 cm   Diastology LVIDs:  3.80 cm   LV e'  lateral: 9.36 cm/s LV PW:         1.00 cm LV IVS:        1.50 cm LVOT diam:     2.20 cm LV SV:         54 LV SV Index:   22 LVOT Area:     3.80 cm  RIGHT VENTRICLE            IVC RV S prime:     9.57 cm/s  IVC diam: 2.40 cm TAPSE (M-mode): 2.4 cm LEFT ATRIUM             Index        RIGHT ATRIUM           Index LA diam:        4.50 cm 1.82 cm/m   RA Area:     19.10 cm LA Vol (A2C):   61.1 ml 24.77 ml/m  RA Volume:   57.60 ml  23.35 ml/m LA Vol (A4C):   37.8 ml 15.32 ml/m LA Biplane Vol: 48.3 ml 19.58 ml/m  AORTIC VALVE LVOT Vmax:   83.00 cm/s LVOT Vmean:  53.000 cm/s LVOT VTI:    0.141 m  AORTA Ao Root diam: 2.50 cm Ao Asc diam:  3.70 cm  SHUNTS Systemic VTI:  0.14 m Systemic Diam: 2.20 cm Vishnu Priya Mallipeddi Electronically signed by Lucetta Russel Mallipeddi Signature Date/Time: 07/04/2023/1:32:41 PM    Final    DG Chest Port 1 View Result Date: 07/02/2023 CLINICAL DATA:  SOB, Afib EXAM: PORTABLE CHEST - 1 VIEW COMPARISON:  02/11/2022. FINDINGS: Cardiac silhouette is prominent. There is pulmonary interstitial prominence with vascular congestion. No focal consolidation. No pneumothorax or pleural effusion identified. IMPRESSION: Findings suggest CHF. Electronically Signed   By: Sydell Eva M.D.   On: 07/02/2023 23:45    Assessment/Plan  1. Atrial fibrillation with RVR (HCC) (Primary) -  Non-compliance with Xarelto  noted, increasing risk of thromboembolic events. Cardioversion considered post-stabilization and UTI resolution. - Ensure compliance with Xarelto  20 mg daily. - Continue metoprolol  tartrate 75 mg twice daily. - Follow up with cardiology on July 20, 2023, to discuss potential cardioversion. - CBC with Differential/Platelets  2. Heart failure with mildly reduced ejection fraction (HFmrEF) (HCC) -  Contributing to shortness of breath. Managed with metoprolol  and Jardiance . - Continue metoprolol  tartrate 75 mg twice daily. - Continue Jardiance  10 mg daily. - Follow up with  cardiology on July 20, 2023.  3. Acute renal failure superimposed on stage 3b chronic kidney disease, unspecified acute renal failure type (HCC) -  Improved to stage 3A with kidney function at 46%. - Order basic metabolic panel to assess kidney function. - Basic Metabolic Panel with eGFR  4. Benign hypertension with chronic kidney disease, stage III (HCC) -   Blood pressure currently low at 90/60 mmHg. Amlodipine  discontinued due to hypotension. - Discontinue amlodipine  5 mg daily. - Monitor blood pressure at home and hold amlodipine  if systolic BP is 100 mmHg or below. - Magnesium   5. Hyperlipidemia with target LDL less than 100 -  Managed with atorvastatin  -   Continue atorvastatin  40 mg daily.  6. Type 2 diabetes mellitus with stage 3b chronic kidney disease, without long-term current use of insulin  (HCC) -  Well-controlled with A1c at 5.6%. - Continue Jardiance  10 mg daily. - Continue Mounjaro  15 mg weekly on Sundays.  7. Urinary tract infection associated with catheterization of urinary tract, unspecified indwelling urinary catheter  type, sequela -  completed Augmentin  course in the hospital - POCT Urinalysis Dip Manual showed moderate leukocytes and large blood - Urine Culture  8. MORBID OBESITY Body mass index is 44.3 kg/m.  - Continue Mounjaro  15 mg  injection weekly  9. Coronary artery disease involving native coronary artery of native heart without angina pectoris -  denies chest pain -  continue atorvastatin  40 mg daily,and aspirin  81 mg daily  10. Benign prostatic hyperplasia, unspecified whether lower urinary tract symptoms present -  Suprapubic catheter in place for 5 years. Occasional blockage due to sediment. - Continue regular irrigation of suprapubic catheter with sterilized water as needed.  11. Sleep apnea, unspecified type -  Uses CPAP with a full mask nightly.     Labs/tests ordered:   urine dipstick, BMP, CBC, Magnesium , urine culture   Return if  symptoms worsen or fail to improve.  Belen Pesch Medina-Vargas, NP

## 2023-07-16 ENCOUNTER — Other Ambulatory Visit: Payer: Self-pay

## 2023-07-16 NOTE — Transitions of Care (Post Inpatient/ED Visit) (Signed)
 Transition of Care week 2  Visit Note  07/16/2023  Name: Thomas [name] MRN: [id number]          DOB: 11/22/1950  Situation: Patient enrolled in [facility] 30-day program. Visit completed with patient and his wife by telephone.   Background: Patient enrolled in [facility] program related to hospitalization 5/16 - 07/06/23 at [facility] for Atrial Fibrillation with RVR. Patient has appt to see cardiology, Hao Meng 07/20/23 for hospital follow up. history significant of PA, history of DVT/PE on xarelto , HFmrEF, T2DM 05/24/23 A1C 5.5, HTN, OSA, hx of prostate cancer, urinary retention with chronic suprapubic catheter, CAD, pulmonary HTN presented to hospital with cloudy urine.Patient reported today he has not weighed or checked his sugar but last reported 300lbs. Patient states he follows with [facility], Dr Lavona Pounds office and the nurse changes his catheter every 30 days.  Patient saw his PCP for hospital follow up 07/15/23 and record states his urine was checked and he is awaiting results of culture.   Initial Transition Care Management Follow-up Telephone Call    Past Medical History:  Diagnosis Date   Acute on chronic diastolic CHF (congestive heart failure) (HCC) 06/01/2017   Arthritis    Cancer (HCC) 2010   Prostate   Chronic kidney disease    Diabetes mellitus    Diabetic neuropathy (HCC)    feet   DVT (deep venous thrombosis) (HCC)    Genetic testing 06/22/2016   Mr. Mayse underwent genetic counseling and testing for hereditary cancer syndromes on 05/14/2016. His results were negative for mutations in all 46 genes analyzed by [facility]'s 46-gene Common Hereditary Cancers Panel. Genes analyzed include: APC, ATM, AXIN2, BARD1, BMPR1A, BRCA1, BRCA2, BRIP1, CDH1, CDKN2A, CHEK2, CTNNA1, DICER1, EPCAM, GREM1, HOXB13, KIT, MEN1, MLH1, MSH2, MSH3, MSH6, MUTYH, NB   GERD (gastroesophageal reflux disease)    Hypertension    PE (pulmonary thromboembolism) (HCC)    Pneumonia    Sleep apnea    not  wearing CPAP   Suprapubic catheter (HCC) 11/17/2019    Assessment: Patient Reported Symptoms: Cognitive Cognitive Status: Alert and oriented to person, place, and time, Normal speech and language skills Cognitive/Intellectual Conditions Management [RPT]: None reported or documented in medical history or problem list      Neurological Neurological Review of Symptoms: No symptoms reported    HEENT HEENT Symptoms Reported: No symptoms reported      Cardiovascular Cardiovascular Symptoms Reported: No symptoms reported Cardiovascular Comment: Patient states he hasn't checked his weight  Respiratory Respiratory Symptoms Reported: Shortness of breath Other Respiratory Symptoms: paitent reporrs O2 is 96% and HR is 56 - 106 Respiratory Conditions: Sleep disordered breathing Respiratory Comment: Patient states he uses CPAP  Endocrine Patient reports the following symptoms related to hypoglycemia or hyperglycemia : No symptoms reported    Gastrointestinal Gastrointestinal Symptoms Reported: No symptoms reported      Genitourinary Additional Genitourinary Details: Patient states he goes to [facility] [place] for cath changes every 30 days - next appt 08/08/23 Genitourinary Management Strategies: Catheter, indwelling  Integumentary Integumentary Symptoms Reported: No symptoms reported Additional Integumentary Details: patient has suprapubic cath and denies any issues at insertion site - last cath change was 07/07/23    Musculoskeletal          Psychosocial           There were no vitals filed for this visit.  Medications Reviewed Today     Reviewed by Sharmaine Dearth, RN (Registered Nurse) on 07/16/23  at 1404  Med List Status: <None>   Medication Order Taking? Sig Documenting Provider Last Dose Status Informant  Accu-Chek Softclix Lancets lancets [id number] Yes Use to test blood sugar daily Verma Gobble, NP Taking Active Self, Pharmacy Records  Alcohol  Swabs (B-D SINGLE  USE SWABS REGULAR) PADS [id number] Yes Use in testing blood sugar daily. Dx: E11.22 Verma Gobble, NP Taking Active Self, Pharmacy Records  amLODipine  (NORVASC ) 5 MG tablet [id number] Yes Take 1 tablet (5 mg total) by mouth daily.  Patient taking differently: Take 5 mg by mouth in the morning.   Verma Gobble, NP Taking Active Self, Pharmacy Records           Med Note Karolynn Pack   YQM Jul 03, 2023  2:32 PM) LF: 05/24/23 for a 90ds  aspirin  EC 81 MG tablet [id number] Yes Take 81 mg by mouth daily. [provider] Taking Active Self, Pharmacy Records  atorvastatin  (LIPITOR) 40 MG tablet [id number] Yes TAKE 1 TABLET EVERY DAY  Patient taking differently: Take 40 mg by mouth daily. Patient is taking daily in the evening   Roselie Conger, Jessica K, NP Taking Active Self, Pharmacy Records           Med Note Karolynn Pack   WNU Jul 03, 2023  2:32 PM) LF: 05/13/23 for a 90ds  Blood Glucose Calibration (ACCU-CHEK AVIVA) SOLN [id number] Yes Use once daily as directed dx E11.22 Verma Gobble, NP Taking Active Self, Pharmacy Records  Blood Glucose Monitoring Suppl (TRUE METRIX AIR GLUCOSE METER) w/Device KIT [id number] Yes 1 Device by Does not apply route daily as needed. E11.22 Verma Gobble, NP Taking Active Self, Pharmacy Records  calcium  carbonate (TUMS - DOSED IN MG ELEMENTAL CALCIUM ) 500 MG chewable tablet [id number] Yes Chew 2 tablets by mouth as needed for indigestion or heartburn. [provider] Taking Active Self, Pharmacy Records  Cholecalciferol  (VITAMIN D3) 50 MCG (2000 UT) TABS [id number] Yes Take 1 tablet (2,000 Units) by mouth daily.  Patient taking differently: Take 2,000 Units by mouth in the morning.    Taking Active Self, Pharmacy Records  empagliflozin  (JARDIANCE ) 10 MG TABS tablet [id number] Yes Take 1 tablet (10 mg total) by mouth daily. Verma Gobble, NP Taking Active Self, Pharmacy Records  furosemide  (LASIX ) 20 MG tablet [id number] Yes  TAKE 1 TABLET AS DIRECTED AS NEEDED  Patient taking differently: Take 20 mg by mouth in the morning.   Gabino Joe, PA-C Taking Active Self, Pharmacy Records           Med Note Karolynn Pack   UKG Jul 03, 2023  2:33 PM) LF: 05/13/23 for a 90ds  glucose blood (ACCU-CHEK AVIVA PLUS) test strip [id number] Yes Use to test blood sugar daily. Verma Gobble, NP Taking Active Self, Pharmacy Records  hydrALAZINE  (APRESOLINE ) 25 MG tablet [id number] Yes TAKE 3 TABLETS THREE TIMES DAILY  Patient taking differently: Take 75 mg by mouth 3 (three) times daily.   Verma Gobble, NP Taking Active Self, Pharmacy Records           Med Note Broadus Canes, [facility] M   Thu Jul 15, 2023 11:05 AM)    Lancets Misc. (ACCU-CHEK SOFTCLIX LANCET DEV) KIT [id number] Yes Use to test blood sugar daily Verma Gobble, NP Taking Active Self, Pharmacy Records  losartan  (COZAAR ) 50 MG tablet [id number] Yes TAKE 1 TABLET EVERY DAY  Patient taking differently: Take 50 mg by mouth in the morning.   Roselie Conger,  Jessica K, NP Taking Active Self, Pharmacy Records           Med Note Karolynn Pack   ZOX Jul 03, 2023  2:34 PM) LF: 04/04/23 for a 90ds  metFORMIN  (GLUCOPHAGE ) 500 MG tablet [id number] Yes TAKE 1 TABLET TWICE DAILY WITH MEALS [name], Jessica K, NP Taking Active Self, Pharmacy Records           Med Note Karolynn Pack   WJX Jul 03, 2023  2:33 PM) LF: 04/29/23 for a 90ds  Metoprolol  Tartrate 75 MG TABS [id number] Yes Take 1 tablet (75 mg total) by mouth 2 (two) times daily. Pokhrel, Laxman, MD Taking Active   Multiple Vitamin (MULTIVITAMIN ADULT PO) [id number] Yes Take 2 each by mouth as needed. [provider] Taking Active Self, Pharmacy Records  potassium chloride  SA (KLOR-CON  M) 20 MEQ tablet [id number] Yes TAKE 1 TABLET TWICE DAILY  Patient taking differently: Take 20 mEq by mouth in the morning.   Verma Gobble, NP Taking Active Self, Pharmacy Records           Med Note  Karolynn Pack   ION Jul 03, 2023  3:11 PM) LF: 04/07/23 for a 90ds  rivaroxaban  (XARELTO ) 20 MG TABS tablet [id number] Yes Take 1 tablet (20 mg total) by mouth daily with supper. Pokhrel, Laxman, MD Taking Active   Senna 8.7 MG CHEW [id number] Yes Chew 1 tablet by mouth as needed. Verma Gobble, NP Taking Active Self, Pharmacy Records  spironolactone  (ALDACTONE ) 25 MG tablet [id number] Yes TAKE 1 TABLET EVERY DAY  Patient taking differently: Take 50 mg by mouth in the morning.   Lucendia Rusk, MD Taking Active Self, Pharmacy Records           Med Note Karolynn Pack   VQQ Jul 03, 2023  2:35 PM) LF: 06/11/23 for a 90ds  tirzepatide  (MOUNJARO ) 15 MG/0.5ML Pen [id number] Yes Inject 15 mg into the skin once a week. Jann Melody, MD Taking Active   traMADol  (ULTRAM ) 50 MG tablet [id number] No Take 1 tablet (50 mg total) by mouth every 6 (six) hours as needed for pain  Patient not taking: Reported on 07/09/2023   Verma Gobble, NP Not Taking Active Self, Pharmacy Records           Med Note Karolynn Pack   CZY Jul 03, 2023  2:36 PM) LF: 03/02/23 for a 8ds  TRUEplus Lancets 28G MISC [id number] Yes TEST BLOOD SUGAR EVERY DAY AS NEEDED Verma Gobble, NP Taking Active Self, Pharmacy Records  UNABLE TO FIND [id number] Yes 500 mg. Med Name: vitmain C [provider] Taking Active   zinc  gluconate 50 MG tablet [id number] Yes Take 1 tablet (50 mg total) by mouth daily. Ngetich, Elijio [place]adel[place], NP Taking Active Self, Pharmacy Records            Recommendation:   Specialty provider follow-up - cardiology appointment 07/20/23  Follow Up Plan:   Telephone follow up appointment date/time:  07/21/23 2pm  Tonia Frankel RN, CCM [place]  VBCI-Population Health RN Care Manager [phone]

## 2023-07-18 ENCOUNTER — Ambulatory Visit: Payer: Self-pay | Admitting: Adult Health

## 2023-07-18 NOTE — Progress Notes (Signed)
-   Hemoglobin within normal, no anemia - WBC and magnesium  normal -Creatinine 1.77, GFR 41, ranging as chronic kidney disease stage IIIb -   Urine culture showed mixed flora, possibly due to contamination during collection of urine sample, if still having urinary symptoms, might need to have repeat collection of urine sample

## 2023-07-19 NOTE — Progress Notes (Signed)
-    he can avoid taking Advil/Ibuprofen, to prevent further damaging your kidneys.

## 2023-07-20 ENCOUNTER — Encounter: Payer: Self-pay | Admitting: Physician Assistant

## 2023-07-20 ENCOUNTER — Other Ambulatory Visit (HOSPITAL_COMMUNITY): Payer: Self-pay

## 2023-07-20 ENCOUNTER — Ambulatory Visit: Attending: Physician Assistant | Admitting: Physician Assistant

## 2023-07-20 VITALS — BP 124/72 | HR 123 | Ht 69.0 in | Wt 307.0 lb

## 2023-07-20 DIAGNOSIS — I2510 Atherosclerotic heart disease of native coronary artery without angina pectoris: Secondary | ICD-10-CM | POA: Diagnosis not present

## 2023-07-20 DIAGNOSIS — J9611 Chronic respiratory failure with hypoxia: Secondary | ICD-10-CM

## 2023-07-20 DIAGNOSIS — Z86711 Personal history of pulmonary embolism: Secondary | ICD-10-CM | POA: Diagnosis not present

## 2023-07-20 DIAGNOSIS — I5022 Chronic systolic (congestive) heart failure: Secondary | ICD-10-CM | POA: Diagnosis not present

## 2023-07-20 DIAGNOSIS — I10 Essential (primary) hypertension: Secondary | ICD-10-CM | POA: Diagnosis not present

## 2023-07-20 DIAGNOSIS — I480 Paroxysmal atrial fibrillation: Secondary | ICD-10-CM

## 2023-07-20 MED ORDER — METOPROLOL TARTRATE 100 MG PO TABS
100.0000 mg | ORAL_TABLET | Freq: Two times a day (BID) | ORAL | 11 refills | Status: DC
  Filled 2023-07-20: qty 60, 30d supply, fill #0
  Filled 2023-08-23: qty 60, 30d supply, fill #1
  Filled 2023-09-23: qty 60, 30d supply, fill #2
  Filled 2023-10-25: qty 60, 30d supply, fill #0
  Filled 2023-10-25: qty 60, 30d supply, fill #3
  Filled 2023-11-23: qty 60, 30d supply, fill #1

## 2023-07-20 NOTE — H&P (View-Only) (Signed)
 Cardiology Office Note   Date:  07/22/2023  ID:  Thomas [name], DOB [birth year], MRN [id number] PCP: Thomas Gobble, NP  [facility] Providers Cardiologist:  Thomas Melody, MD Cardiology APP:  Thomas Joe, PA-C     History of Present Illness Thomas [name] is a 73 y.o. male with PMH of paroxysmal atrial fibrillation, HFmrEF, DVT and PE 2018, hypertension, DM2, nonobstructive CAD, OSA, prostate cancer and urinary retention with chronic suprapubic catheter.  He was previously in atrial fibrillation in 2019.  Recently he presented to the hospital was palpitation and was noted to be back in A-fib with RVR.  Cardiology service was consulted.  Aspirin  discontinued.  Carvedilol  was switched to metoprolol  for better rate control.  Echocardiogram during that admission showed EF 40 to 45%, moderately reduced RV function.  He was ultimately discharged on Jardiance , Lasix  20 mg daily, hydralazine , losartan  the spironolactone .  Since the patient has not been compliant with Xarelto  at home prior to hospitalization, decision was made to continue with rate control and repeat cardioversion as outpatient after anticoagulated for 3 weeks.  Since discharge, patient has continued to feel very poorly.  Talking with the patient today, he did miss a week of Xarelto  prior to hospitalization.  Currently, he has no lower extremity edema, he has increasing dyspnea on exertion.  At baseline he required 2.5 L of oxygen  however with exertion he will increase oxygen  supply to 4 L/min.  Heart rate is elevated at 123.  I will stop his amlodipine  and increase metoprolol  to 100 mg twice a day dosing.  He has been taking Xarelto  20 in the morning instead of with the largest meal dinner of the day.  I asked him to switch Xarelto  to nighttime taken with dinner.  I discussed the case with Dr. Alroy Aspen DOD, we will proceed with TEE DCCV.  I plan to see the patient back in 2 to 3 weeks post  cardioversion.  ROS:   He has dyspnea at baseline, however has worsened lately.  He denies any major chest pain.  He has no lower extremity edema.  Studies Reviewed EKG Interpretation Date/Time:  Tuesday July 20 2023 15:13:51 EDT Ventricular Rate:  123 PR Interval:    QRS Duration:  88 QT Interval:  344 QTC Calculation: 492 R Axis:   12  Text Interpretation: Atrial fibrillation with rapid ventricular response with premature ventricular or aberrantly conducted complexes Abnormal QRS-T angle, consider primary T wave abnormality When compared with ECG of 02-Jul-2023 23:01, PREVIOUS ECG IS PRESENT Confirmed by Thomas [name] [phone]) on 07/20/2023 4:02:11 PM    Cardiac Studies & Procedures   ______________________________________________________________________________________________ CARDIAC CATHETERIZATION  CARDIAC CATHETERIZATION 07/06/2017  Conclusion  There is mild left ventricular systolic dysfunction.  LV end diastolic pressure is moderately elevated, 30 mm Hg.  The left ventricular ejection fraction is 45-50% by visual estimate.  There is no aortic valve stenosis.  Hemodynamic findings consistent with moderate to severe pulmonary hypertension.  CO 10.8 L/min; CI 4.2, Ao sat 91%; PA sat 72%; mean PCWP 21 mm Hg  Nonobstructive CAD.  Prox Cx lesion is 25% stenosed.  Non-obstructive CAD.  Moderate to severe pulmonary hypertension.  Will need management for pulmonary hypertension and diuresis.  Plan to increase Lasix  dose starting tomorrow with f/u labs to be ordered.  Findings Coronary Findings Diagnostic  Dominance: Left  Left Anterior Descending There is mild diffuse disease throughout the vessel.  Left Circumflex Prox Cx lesion is 25% stenosed.  Intervention  No interventions have been documented.     ECHOCARDIOGRAM  ECHOCARDIOGRAM COMPLETE 07/04/2023  Narrative ECHOCARDIOGRAM REPORT    Patient Name:   Thomas [name] [facility] Date of Exam:  07/04/2023 Medical Rec #:  [id number]            Height:       69.0 in Accession #:    [id number]           Weight:       303.6 lb Date of Birth:  05/31/1950            BSA:          2.467 m Patient Age:    73 years             BP:           114/74 mmHg Patient Gender: M                    HR:           85 bpm. Exam Location:  Inpatient  Procedure: 2D Echo, Cardiac Doppler, Color Doppler and Intracardiac Opacification Agent (Both Spectral and Color Flow Doppler were utilized during procedure).  Indications:    Atrial fibrillation  History:        Patient has prior history of Echocardiogram examinations, most recent 05/13/2022. Risk Factors:Hypertension.  Sonographer:    Janette Medley Referring Phys: [id number] ALLISON WOLFE   Sonographer Comments: Technically difficult study due to poor echo windows. IMPRESSIONS   1. Poor Echo images despite Definity . 2. Left ventricular ejection fraction, by estimation, is 40 to 45%. The left ventricle has mildly decreased function. The left ventricle demonstrates global hypokinesis. Left ventricular diastolic function could not be evaluated. 3. Right ventricular systolic function is moderately reduced. The right ventricular size is normal. 4. The mitral valve is grossly normal. No evidence of mitral valve regurgitation. No evidence of mitral stenosis. 5. The aortic valve was not well visualized. Aortic valve regurgitation is not visualized. No aortic stenosis is present.  Comparison(s): Changes from prior study are noted. LVEF worsened from 45-50% to 40-45%.  FINDINGS Left Ventricle: Left ventricular ejection fraction, by estimation, is 40 to 45%. The left ventricle has mildly decreased function. The left ventricle demonstrates global hypokinesis. Strain was performed and the global longitudinal strain is indeterminate. The left ventricular internal cavity size was normal in size. There is no left ventricular hypertrophy. Left ventricular diastolic  function could not be evaluated due to atrial fibrillation. Left ventricular diastolic function could not be evaluated.  Right Ventricle: The right ventricular size is normal. No increase in right ventricular wall thickness. Right ventricular systolic function is moderately reduced.  Left [facility]: Left atrial size was normal in size.  Right [facility]: Right atrial size was normal in size.  Pericardium: There is no evidence of pericardial effusion.  Mitral Valve: The mitral valve is grossly normal. No evidence of mitral valve regurgitation. No evidence of mitral valve stenosis.  Tricuspid Valve: The tricuspid valve is not well visualized. Tricuspid valve regurgitation is not demonstrated. No evidence of tricuspid stenosis.  Aortic Valve: The aortic valve was not well visualized. Aortic valve regurgitation is not visualized. No aortic stenosis is present.  Pulmonic Valve: The pulmonic valve was not well visualized. Pulmonic valve regurgitation is trivial. No evidence of pulmonic stenosis.  Aorta: The aortic root is normal in size and structure.  Venous: The inferior vena cava was not well visualized.  IAS/Shunts: No atrial level shunt detected by  color flow Doppler.  Additional Comments: 3D was performed not requiring image post processing on an independent workstation and was indeterminate.   LEFT VENTRICLE PLAX 2D LVIDd:         5.00 cm   Diastology LVIDs:         3.80 cm   LV e' lateral: 9.36 cm/s LV PW:         1.00 cm LV IVS:        1.50 cm LVOT diam:     2.20 cm LV SV:         54 LV SV Index:   22 LVOT Area:     3.80 cm   RIGHT VENTRICLE            IVC RV S prime:     9.57 cm/s  IVC diam: 2.40 cm TAPSE (M-mode): 2.4 cm  LEFT ATRIUM             Index        RIGHT ATRIUM           Index LA diam:        4.50 cm 1.82 cm/m   RA Area:     19.10 cm LA Vol (A2C):   61.1 ml 24.77 ml/m  RA Volume:   57.60 ml  23.35 ml/m LA Vol (A4C):   37.8 ml 15.32 ml/m LA Biplane Vol:  48.3 ml 19.58 ml/m AORTIC VALVE LVOT Vmax:   83.00 cm/s LVOT Vmean:  53.000 cm/s LVOT VTI:    0.141 m  AORTA Ao Root diam: 2.50 cm Ao Asc diam:  3.70 cm   SHUNTS Systemic VTI:  0.14 m Systemic Diam: 2.20 cm  Vishnu Priya Mallipeddi Electronically signed by Lucetta Russel Mallipeddi Signature Date/Time: 07/04/2023/1:32:41 PM    Final          ______________________________________________________________________________________________      Risk Assessment/Calculations  CHA2DS2-VASc Score = 5   This indicates a 7.2% annual risk of stroke. The patient's score is based upon: CHF History: 1 HTN History: 1 Diabetes History: 1 Stroke History: 0 Vascular Disease History: 1 Age Score: 1 Gender Score: 0           Physical Exam VS:  BP 124/72   Pulse (!) 123   Ht 5\' 9"  (1.753 m)   Wt (!) 307 lb (139.3 kg)   SpO2 91%   BMI 45.34 kg/m    Wt Readings from Last 3 Encounters:  07/20/23 (!) 307 lb (139.3 kg)  07/15/23 300 lb (136.1 kg)  07/09/23 300 lb (136.1 kg)    GEN: Well nourished, well developed in no acute distress.  Morbidly obese, on oxygen . NECK: No JVD; No carotid bruits CARDIAC: Irregularly irregular, no murmurs, rubs, gallops RESPIRATORY:  Clear to auscultation without rales, wheezing or rhonchi  ABDOMEN: Soft, non-tender, non-distended EXTREMITIES:  No edema; No deformity   ASSESSMENT AND PLAN  Paroxysmal atrial fibrillation: Currently in RVR with heart rate in the 120s.  I will discontinue amlodipine  and increase metoprolol  tartrate to 100 mg twice a day for better rate control.  Although he has been compliant with the Xarelto  since discharge, he did miss a week of Xarelto  prior to admission as he ran out of the medication.  Also since discharge, he has been taking Xarelto  in the morning instead of with the largest meal of the day.  He feel poorly with increased shortness of breath and fatigue while getting A-fib.  He appears to be euvolemic.  I  discussed his case with Dr. Alroy Aspen DOD, options is either to proceed with TEE DCCV or wait another 3 weeks prior to DCCV without TEE.  Talking with the patient, he does not think he can wait another 3 weeks, he is willing to proceed with TEE DCCV.  CAD: History of nonobstructive CAD.  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  Patient appears to be euvolemic on exam  History of PE: On Xarelto   Hypertension: Blood pressure stable, will discontinue amlodipine  and increase metoprolol  to tartrate to 100 mg twice a day for better rate control  Chronic respiratory failure: On 2.5 L nasal cannula at baseline, increased to 4 L with exertion.  Patient is morbidly obese.  Suspect he also has a component of obesity hypoventilation syndrome.    Informed Consent   Shared Decision Making/Informed Consent  The risks [stroke, cardiac arrhythmias rarely resulting in the need for a temporary or permanent pacemaker, skin irritation or burns, esophageal damage, perforation (1:10,000 risk), bleeding, pharyngeal hematoma as well as other potential complications associated with conscious sedation including aspiration, arrhythmia, respiratory failure and death], benefits (treatment guidance, restoration of normal sinus rhythm, diagnostic support) and alternatives of a transesophageal echocardiogram guided cardioversion were discussed in detail with Mr. Schifano and he is willing to proceed.     Dispo: Follow-up in 2 to 3 weeks.  Signed, Thomas Heath, PA

## 2023-07-20 NOTE — Patient Instructions (Signed)
 Medication Instructions:  STOP AMLODIPINE    INCREASE METOPROLOL  TARTRATE TO 100 MG TWICE DAILY *If you need a refill on your cardiac medications before your next appointment, please call your pharmacy*  Lab Work: NO LABS If you have labs (blood work) drawn today and your tests are completely normal, you will receive your results only by: MyChart Message (if you have MyChart) OR A paper copy in the mail If you have any lab test that is abnormal or we need to change your treatment, we will call you to review the results.  Testing/Procedures:     Dear Thomas [name]  You are scheduled for a TEE (Transesophageal Echocardiogram) Guided Cardioversion on Friday, June 6 with Dr. Renna Cary.  Please arrive at the [facility] (Main Entrance A) at [facility]: [address] [place], [place] [address] at 8:30 AM (This time is 1 hour(s) before your procedure to ensure your preparation).   Free valet parking service is available. You will check in at ADMITTING.   *Please Note: You will receive a call the day before your procedure to confirm the appointment time. That time may have changed from the original time based on the schedule for that day.*    DIET:  Nothing to eat or drink after midnight except a sip of water with medications (see medication instructions below)  MEDICATION INSTRUCTIONS:  HOLD JARDIANCE  3 DAYS PRIOR   HOLD METFORMIN  3 DAYS PRIOR   HOLD MOUNJARO  3 DAYS PRIOR        :1}Continue taking your anticoagulant (blood thinner): Rivaroxaban  (Xarelto ).  You will need to continue this after your procedure until you are told by your provider that it is safe to stop.    LABS: No Labs Needed  FYI:  For your safety, and to allow us  to monitor your vital signs accurately during the surgery/procedure we request: If you have artificial nails, gel coating, SNS etc, please have those removed prior to your surgery/procedure. Not having the nail coverings /polish removed may  result in cancellation or delay of your surgery/procedure.  Your support person will be asked to wait in the waiting room during your procedure.  It is OK to have someone drop you off and come back when you are ready to be discharged.  You cannot drive after the procedure and will need someone to drive you home.  Bring your insurance cards.  *Special Note: Every effort is made to have your procedure done on time. Occasionally there are emergencies that occur at the hospital that may cause delays. Please be patient if a delay does occur.      Follow-Up: At [facility], you and your health needs are our priority.  As part of our continuing mission to provide you with exceptional heart care, our providers are all part of one team.  This team includes your primary Cardiologist (physician) and Advanced Practice Providers or APPs (Physician Assistants and Nurse Practitioners) who all work together to provide you with the care you need, when you need it.  Your next appointment:   2-3 week(s)  Provider:   Ervin Heath, PA

## 2023-07-20 NOTE — Progress Notes (Unsigned)
 Cardiology Office Note   Date:  07/22/2023  ID:  Thomas [name], DOB [birth year], MRN [id number] PCP: Verma Gobble, NP  [facility] Providers Cardiologist:  Jann Melody, MD Cardiology APP:  Gabino Joe, PA-C     History of Present Illness Thomas [name] is a 73 y.o. male with PMH of paroxysmal atrial fibrillation, HFmrEF, DVT and PE 2018, hypertension, DM2, nonobstructive CAD, OSA, prostate cancer and urinary retention with chronic suprapubic catheter.  He was previously in atrial fibrillation in 2019.  Recently he presented to the hospital was palpitation and was noted to be back in A-fib with RVR.  Cardiology service was consulted.  Aspirin  discontinued.  Carvedilol  was switched to metoprolol  for better rate control.  Echocardiogram during that admission showed EF 40 to 45%, moderately reduced RV function.  He was ultimately discharged on Jardiance , Lasix  20 mg daily, hydralazine , losartan  the spironolactone .  Since the patient has not been compliant with Xarelto  at home prior to hospitalization, decision was made to continue with rate control and repeat cardioversion as outpatient after anticoagulated for 3 weeks.  Since discharge, patient has continued to feel very poorly.  Talking with the patient today, he did miss a week of Xarelto  prior to hospitalization.  Currently, he has no lower extremity edema, he has increasing dyspnea on exertion.  At baseline he required 2.5 L of oxygen  however with exertion he will increase oxygen  supply to 4 L/min.  Heart rate is elevated at 123.  I will stop his amlodipine  and increase metoprolol  to 100 mg twice a day dosing.  He has been taking Xarelto  20 in the morning instead of with the largest meal dinner of the day.  I asked him to switch Xarelto  to nighttime taken with dinner.  I discussed the case with Dr. Alroy Aspen DOD, we will proceed with TEE DCCV.  I plan to see the patient back in 2 to 3 weeks post  cardioversion.  ROS:   He has dyspnea at baseline, however has worsened lately.  He denies any major chest pain.  He has no lower extremity edema.  Studies Reviewed EKG Interpretation Date/Time:  Tuesday July 20 2023 15:13:51 EDT Ventricular Rate:  123 PR Interval:    QRS Duration:  88 QT Interval:  344 QTC Calculation: 492 R Axis:   12  Text Interpretation: Atrial fibrillation with rapid ventricular response with premature ventricular or aberrantly conducted complexes Abnormal QRS-T angle, consider primary T wave abnormality When compared with ECG of 02-Jul-2023 23:01, PREVIOUS ECG IS PRESENT Confirmed by Ervin Heath [phone]) on 07/20/2023 4:02:11 PM    Cardiac Studies & Procedures   ______________________________________________________________________________________________ CARDIAC CATHETERIZATION  CARDIAC CATHETERIZATION 07/06/2017  Conclusion  There is mild left ventricular systolic dysfunction.  LV end diastolic pressure is moderately elevated, 30 mm Hg.  The left ventricular ejection fraction is 45-50% by visual estimate.  There is no aortic valve stenosis.  Hemodynamic findings consistent with moderate to severe pulmonary hypertension.  CO 10.8 L/min; CI 4.2, Ao sat 91%; PA sat 72%; mean PCWP 21 mm Hg  Nonobstructive CAD.  Prox Cx lesion is 25% stenosed.  Non-obstructive CAD.  Moderate to severe pulmonary hypertension.  Will need management for pulmonary hypertension and diuresis.  Plan to increase Lasix  dose starting tomorrow with f/u labs to be ordered.  Findings Coronary Findings Diagnostic  Dominance: Left  Left Anterior Descending There is mild diffuse disease throughout the vessel.  Left Circumflex Prox Cx lesion is 25% stenosed.  Intervention  No interventions have been documented.     ECHOCARDIOGRAM  ECHOCARDIOGRAM COMPLETE 07/04/2023  Narrative ECHOCARDIOGRAM REPORT    Patient Name:   Thomas [name] [facility] Date of Exam:  07/04/2023 Medical Rec #:  [id number]            Height:       69.0 in Accession #:    [id number]           Weight:       303.6 lb Date of Birth:  05/31/1950            BSA:          2.467 m Patient Age:    73 years             BP:           114/74 mmHg Patient Gender: M                    HR:           85 bpm. Exam Location:  Inpatient  Procedure: 2D Echo, Cardiac Doppler, Color Doppler and Intracardiac Opacification Agent (Both Spectral and Color Flow Doppler were utilized during procedure).  Indications:    Atrial fibrillation  History:        Patient has prior history of Echocardiogram examinations, most recent 05/13/2022. Risk Factors:Hypertension.  Sonographer:    Janette Medley Referring Phys: [id number] ALLISON WOLFE   Sonographer Comments: Technically difficult study due to poor echo windows. IMPRESSIONS   1. Poor Echo images despite Definity . 2. Left ventricular ejection fraction, by estimation, is 40 to 45%. The left ventricle has mildly decreased function. The left ventricle demonstrates global hypokinesis. Left ventricular diastolic function could not be evaluated. 3. Right ventricular systolic function is moderately reduced. The right ventricular size is normal. 4. The mitral valve is grossly normal. No evidence of mitral valve regurgitation. No evidence of mitral stenosis. 5. The aortic valve was not well visualized. Aortic valve regurgitation is not visualized. No aortic stenosis is present.  Comparison(s): Changes from prior study are noted. LVEF worsened from 45-50% to 40-45%.  FINDINGS Left Ventricle: Left ventricular ejection fraction, by estimation, is 40 to 45%. The left ventricle has mildly decreased function. The left ventricle demonstrates global hypokinesis. Strain was performed and the global longitudinal strain is indeterminate. The left ventricular internal cavity size was normal in size. There is no left ventricular hypertrophy. Left ventricular diastolic  function could not be evaluated due to atrial fibrillation. Left ventricular diastolic function could not be evaluated.  Right Ventricle: The right ventricular size is normal. No increase in right ventricular wall thickness. Right ventricular systolic function is moderately reduced.  Left Atrium: Left atrial size was normal in size.  Right Atrium: Right atrial size was normal in size.  Pericardium: There is no evidence of pericardial effusion.  Mitral Valve: The mitral valve is grossly normal. No evidence of mitral valve regurgitation. No evidence of mitral valve stenosis.  Tricuspid Valve: The tricuspid valve is not well visualized. Tricuspid valve regurgitation is not demonstrated. No evidence of tricuspid stenosis.  Aortic Valve: The aortic valve was not well visualized. Aortic valve regurgitation is not visualized. No aortic stenosis is present.  Pulmonic Valve: The pulmonic valve was not well visualized. Pulmonic valve regurgitation is trivial. No evidence of pulmonic stenosis.  Aorta: The aortic root is normal in size and structure.  Venous: The inferior vena cava was not well visualized.  IAS/Shunts: No atrial level shunt detected by  color flow Doppler.  Additional Comments: 3D was performed not requiring image post processing on an independent workstation and was indeterminate.   LEFT VENTRICLE PLAX 2D LVIDd:         5.00 cm   Diastology LVIDs:         3.80 cm   LV e' lateral: 9.36 cm/s LV PW:         1.00 cm LV IVS:        1.50 cm LVOT diam:     2.20 cm LV SV:         54 LV SV Index:   22 LVOT Area:     3.80 cm   RIGHT VENTRICLE            IVC RV S prime:     9.57 cm/s  IVC diam: 2.40 cm TAPSE (M-mode): 2.4 cm  LEFT ATRIUM             Index        RIGHT ATRIUM           Index LA diam:        4.50 cm 1.82 cm/m   RA Area:     19.10 cm LA Vol (A2C):   61.1 ml 24.77 ml/m  RA Volume:   57.60 ml  23.35 ml/m LA Vol (A4C):   37.8 ml 15.32 ml/m LA Biplane Vol:  48.3 ml 19.58 ml/m AORTIC VALVE LVOT Vmax:   83.00 cm/s LVOT Vmean:  53.000 cm/s LVOT VTI:    0.141 m  AORTA Ao Root diam: 2.50 cm Ao Asc diam:  3.70 cm   SHUNTS Systemic VTI:  0.14 m Systemic Diam: 2.20 cm  Vishnu Priya Mallipeddi Electronically signed by Lucetta Russel Mallipeddi Signature Date/Time: 07/04/2023/1:32:41 PM    Final          ______________________________________________________________________________________________      Risk Assessment/Calculations  CHA2DS2-VASc Score = 5   This indicates a 7.2% annual risk of stroke. The patient's score is based upon: CHF History: 1 HTN History: 1 Diabetes History: 1 Stroke History: 0 Vascular Disease History: 1 Age Score: 1 Gender Score: 0           Physical Exam VS:  BP 124/72   Pulse (!) 123   Ht 5\' 9"  (1.753 m)   Wt (!) 307 lb (139.3 kg)   SpO2 91%   BMI 45.34 kg/m    Wt Readings from Last 3 Encounters:  07/20/23 (!) 307 lb (139.3 kg)  07/15/23 300 lb (136.1 kg)  07/09/23 300 lb (136.1 kg)    GEN: Well nourished, well developed in no acute distress.  Morbidly obese, on oxygen . NECK: No JVD; No carotid bruits CARDIAC: Irregularly irregular, no murmurs, rubs, gallops RESPIRATORY:  Clear to auscultation without rales, wheezing or rhonchi  ABDOMEN: Soft, non-tender, non-distended EXTREMITIES:  No edema; No deformity   ASSESSMENT AND PLAN  Paroxysmal atrial fibrillation: Currently in RVR with heart rate in the 120s.  I will discontinue amlodipine  and increase metoprolol  tartrate to 100 mg twice a day for better rate control.  Although he has been compliant with the Xarelto  since discharge, he did miss a week of Xarelto  prior to admission as he ran out of the medication.  Also since discharge, he has been taking Xarelto  in the morning instead of with the largest meal of the day.  He feel poorly with increased shortness of breath and fatigue while getting A-fib.  He appears to be euvolemic.  I  discussed his case with Dr. Alroy Aspen DOD, options is either to proceed with TEE DCCV or wait another 3 weeks prior to DCCV without TEE.  Talking with the patient, he does not think he can wait another 3 weeks, he is willing to proceed with TEE DCCV.  CAD: History of nonobstructive CAD.  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  Patient appears to be euvolemic on exam  History of PE: On Xarelto   Hypertension: Blood pressure stable, will discontinue amlodipine  and increase metoprolol  to tartrate to 100 mg twice a day for better rate control  Chronic respiratory failure: On 2.5 L nasal cannula at baseline, increased to 4 L with exertion.  Patient is morbidly obese.  Suspect he also has a component of obesity hypoventilation syndrome.    Informed Consent   Shared Decision Making/Informed Consent  The risks [stroke, cardiac arrhythmias rarely resulting in the need for a temporary or permanent pacemaker, skin irritation or burns, esophageal damage, perforation (1:10,000 risk), bleeding, pharyngeal hematoma as well as other potential complications associated with conscious sedation including aspiration, arrhythmia, respiratory failure and death], benefits (treatment guidance, restoration of normal sinus rhythm, diagnostic support) and alternatives of a transesophageal echocardiogram guided cardioversion were discussed in detail with Mr. Schifano and he is willing to proceed.     Dispo: Follow-up in 2 to 3 weeks.  Signed, Ervin Heath, PA

## 2023-07-21 ENCOUNTER — Other Ambulatory Visit: Payer: Self-pay

## 2023-07-21 ENCOUNTER — Telehealth: Payer: Self-pay | Admitting: Physician Assistant

## 2023-07-21 NOTE — Transitions of Care (Post Inpatient/ED Visit) (Signed)
 Transition of Care week 3  Visit Note  07/21/2023  Name: Thomas [name] MRN: [id number]          DOB: 02-26-1950  Situation: Patient enrolled in [facility] 30-day program. Visit completed with patient  by telephone.   Background: Patient enrolled in [facility] program related to hospitalization 5/16 - 07/06/23 at [facility] for Atrial Fibrillation with RVR.  history significant of PA, history of DVT/PE on xarelto , HFmrEF, T2DM 05/24/23 A1C 5.5, HTN, OSA, hx of prostate cancer, urinary retention with chronic suprapubic catheter, CAD, pulmonary HTN presented to hospital with cloudy urine.Patient saw cardiology, Hao Meng 07/20/23 for hospital follow up and has planned TEE cardioversion 07/23/23 - while reviewing the instructions, patient states he did not realize he was to hold the Metformin  and only held Jardiance . We made a conference call to cardiology office and staff took a message and said a nurse will call him back. TOC RN will follow up Monday 07/27/23 post TEE   Initial Transition Care Management Follow-up Telephone Call    Past Medical History:  Diagnosis Date   Acute on chronic diastolic CHF (congestive heart failure) (HCC) 06/01/2017   Arthritis    Cancer (HCC) 2010   Prostate   Chronic kidney disease    Diabetes mellitus    Diabetic neuropathy (HCC)    feet   DVT (deep venous thrombosis) (HCC)    Genetic testing 06/22/2016   Mr. Thomas [name] underwent genetic counseling and testing for hereditary cancer syndromes on 05/14/2016. His results were negative for mutations in all 46 genes analyzed by [facility]'s 46-gene Common Hereditary Cancers Panel. Genes analyzed include: APC, ATM, AXIN2, BARD1, BMPR1A, BRCA1, BRCA2, BRIP1, CDH1, CDKN2A, CHEK2, CTNNA1, DICER1, EPCAM, GREM1, HOXB13, KIT, MEN1, MLH1, MSH2, MSH3, MSH6, MUTYH, NB   GERD (gastroesophageal reflux disease)    Hypertension    PE (pulmonary thromboembolism) (HCC)    Pneumonia    Sleep apnea    not wearing CPAP   Suprapubic catheter (HCC)  11/17/2019    Assessment: Patient Reported Symptoms:Unable to complete assessments today related patient states family is present for daughter's birthday - We addressed upcoming TEE cardioversion. TOC RN will follow up 07/27/23   There were no vitals filed for this visit.  Medications Reviewed Today     Reviewed by Sharmaine Dearth, RN (Registered Nurse) on 07/21/23 at 1418  Med List Status: <None>   Medication Order Taking? Sig Documenting Provider Last Dose Status Informant  Accu-Chek Softclix Lancets lancets [id number] Yes Use to test blood sugar daily Verma Gobble, NP Taking Active Self, Pharmacy Records  Alcohol  Swabs (B-D SINGLE USE SWABS REGULAR) PADS [id number] Yes Use in testing blood sugar daily. Dx: E11.22 Verma Gobble, NP Taking Active Self, Pharmacy Records  aspirin  EC 81 MG tablet [id number] Yes Take 81 mg by mouth daily. [provider] Taking Active Self, Pharmacy Records  atorvastatin  (LIPITOR) 40 MG tablet [id number] Yes TAKE 1 TABLET EVERY DAY  Patient taking differently: Take 40 mg by mouth daily. Patient is taking daily in the evening   Roselie Conger Jessica K, NP Taking Active Self, Pharmacy Records           Med Note Karolynn Pack   OJJ Jul 03, 2023  2:32 PM) LF: 05/13/23 for a 90ds  Blood Glucose Calibration (ACCU-CHEK AVIVA) SOLN [id number] Yes Use once daily as directed dx E11.22 Verma Gobble, NP Taking Active Self, Pharmacy Records  Blood Glucose Monitoring Suppl (TRUE METRIX AIR GLUCOSE METER) w/Device KIT [id number]  Yes 1 Device by Does not apply route daily as needed. E11.22 Verma Gobble, NP Taking Active Self, Pharmacy Records  calcium  carbonate (TUMS - DOSED IN MG ELEMENTAL CALCIUM ) 500 MG chewable tablet [id number] Yes Chew 2 tablets by mouth as needed for indigestion or heartburn. [provider] Taking Active Self, Pharmacy Records  Cholecalciferol  (VITAMIN D3) 50 MCG (2000 UT) TABS [id number] No Take 1 tablet (2,000  Units) by mouth daily.  Patient not taking: Reported on 07/21/2023    Not Taking Active Self, Pharmacy Records  empagliflozin  (JARDIANCE ) 10 MG TABS tablet [id number] No Take 1 tablet (10 mg total) by mouth daily.  Patient not taking: Reported on 07/21/2023   Verma Gobble, NP Not Taking Active Self, Pharmacy Records           Med Note [facility] [place] A   Wed Jul 21, 2023  2:15 PM) On hold 3 days prior to 07/23/23 TEE  furosemide  (LASIX ) 20 MG tablet [id number] Yes TAKE 1 TABLET AS DIRECTED AS NEEDED  Patient taking differently: Take 20 mg by mouth in the morning.   Gabino Joe, PA-C Taking Active Self, Pharmacy Records           Med Note Karolynn Pack   GUY Jul 03, 2023  2:33 PM) LF: 05/13/23 for a 90ds  glucose blood (ACCU-CHEK AVIVA PLUS) test strip [id number] Yes Use to test blood sugar daily. Verma Gobble, NP Taking Active Self, Pharmacy Records  hydrALAZINE  (APRESOLINE ) 25 MG tablet [id number] Yes TAKE 3 TABLETS THREE TIMES DAILY  Patient taking differently: Take 75 mg by mouth 3 (three) times daily.   Verma Gobble, NP Taking Active Self, Pharmacy Records           Med Note [facility], [facility] M   Thu Jul 15, 2023 11:05 AM)    Lancets Misc. (ACCU-CHEK SOFTCLIX LANCET DEV) KIT [id number] Yes Use to test blood sugar daily Verma Gobble, NP Taking Active Self, Pharmacy Records  losartan  (COZAAR ) 50 MG tablet [id number] Yes TAKE 1 TABLET EVERY DAY  Patient taking differently: Take 50 mg by mouth in the morning.   Verma Gobble, NP Taking Active Self, Pharmacy Records           Med Note Karolynn Pack   FUX Jul 03, 2023  2:34 PM) LF: 04/04/23 for a 90ds  metFORMIN  (GLUCOPHAGE ) 500 MG tablet [id number] No TAKE 1 TABLET TWICE DAILY WITH MEALS  Patient not taking: Reported on 07/21/2023   Verma Gobble, NP Not Taking Active Self, Pharmacy Records           Med Note [name], [place] A   Wed Jul 21, 2023  2:12 PM) On hold until after 07/23/23 TEE  metoprolol   tartrate (LOPRESSOR ) 100 MG tablet [id number] Yes Take 1 tablet (100 mg total) by mouth 2 (two) times daily. Meng, Hao, [place] Taking Active   Multiple Vitamin (MULTIVITAMIN ADULT PO) [id number] Yes Take 2 each by mouth as needed. [provider] Taking Active Self, Pharmacy Records  potassium chloride  SA (KLOR-CON  M) 20 MEQ tablet [id number] Yes TAKE 1 TABLET TWICE DAILY  Patient taking differently: Take 20 mEq by mouth in the morning.   Verma Gobble, NP Taking Active Self, Pharmacy Records           Med Note Karolynn Pack   VOH Jul 03, 2023  3:11 PM) LF: 04/07/23 for a 90ds  rivaroxaban  (XARELTO ) 20 MG TABS tablet [id number] Yes Take 1  tablet (20 mg total) by mouth daily with supper. Pokhrel, Laxman, MD Taking Active   Senna 8.7 MG CHEW [id number] Yes Chew 1 tablet by mouth as needed. Verma Gobble, NP Taking Active Self, Pharmacy Records  spironolactone  (ALDACTONE ) 25 MG tablet [id number] Yes TAKE 1 TABLET EVERY DAY  Patient taking differently: Take 50 mg by mouth in the morning.   Lucendia Rusk, MD Taking Active Self, Pharmacy Records           Med Note Karolynn Pack   NWG Jul 03, 2023  2:35 PM) LF: 06/11/23 for a 90ds  tirzepatide  (MOUNJARO ) 15 MG/0.5ML Pen [id number] No Inject 15 mg into the skin once a week.  Patient not taking: Reported on 07/21/2023   Jann Melody, MD Not Taking Active            Med Note [facility], [place] A   Wed Jul 21, 2023  2:13 PM) On hold 3 days prior to 07/23/23  TRUEplus Lancets 28G MISC [id number] Yes TEST BLOOD SUGAR EVERY DAY AS NEEDED Verma Gobble, NP Taking Active Self, Pharmacy Records  UNABLE TO FIND [id number] Yes 500 mg. Med Name: vitmain C [provider] Taking Active   zinc  gluconate 50 MG tablet [id number] Yes Take 1 tablet (50 mg total) by mouth daily. Ngetich, Elijio [place], NP Taking Active Self, Pharmacy Records            Recommendation:   Continue Current Plan of Care  Follow Up Plan:    Telephone follow up appointment date/time:  07/27/23 10am  Tonia Frankel RN, CCM [place]  [facility]BC[facility]Population Health RN Care Manager [phone]

## 2023-07-21 NOTE — Telephone Encounter (Signed)
 Wife Felipa Horsfall) called to follow-up on patient taking his Metformin  medication this morning accidently and wants to know if this will hinder him doing his procedure on Friday, 6/6.

## 2023-07-21 NOTE — Telephone Encounter (Signed)
 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

## 2023-07-21 NOTE — Telephone Encounter (Signed)
 New Message:      Patient is scheduled for a Cardioversion on Friday(07-23-23. Patient was supposed to hold some of his medicine for 3 days. He made a mistake and forgot and took the Metfornin. He wants to know what does he need to?

## 2023-07-21 NOTE — Patient Instructions (Signed)
 Visit Information  Thank you for taking time to visit with me today. Please don't hesitate to contact me if I can be of assistance to you before our next scheduled telephone appointment.  Our next appointment is by telephone on 07/27/23 at 10am  Following is a copy of your care plan:   Goals Addressed             This Visit's Progress    VBCI Transitions of Care (TOC) Care Plan       Problems:  Recent Hospitalization for treatment of Atrial Fibrillation with RVR Patient reports feeling the same as he did when he left the hospital 07/16/23 Patient has appointment with cardiology 07/20/23 Patient is scheduled for TEE 07/23/23 and was advised to hold his Jardiance , Mounjaro , and Metformin  - with medication reconcilation today 07/21/23 patient states he did not realize he was supposed to hold the Metformin  - conference call with patient was made to cardiology office at [phone]. Staff advised patient that the nurse will call him back  Goal:  Over the next 30 days, the patient will not experience hospital readmission  Interventions:  Transitions of Care: Doctor Visits  - discussed the importance of doctor visits 5/30/25Per chart review-patient was seen 07/15/23 by PCP - record reflects, "A recent urine dipstick test showed moderate leukocytes and large blood, and a urine culture has been sent to rule out ongoing infection" - results not yet available. Patient reports during call today that symptoms have improved. Educated patient to call PCP office over the weekend if symptoms return and ask for on call MD to check for results and possible antibiotics if needed.  07/21/23 Update: TOC RN reviewed notes from Office visit 07/20/23 Cardio, [employer]  124/72  HR 123 Metoprolol  increased to 100mg  BID Amlodipine  dc'd - (TOC RN confirmed with patient) - TEE (Transesophageal Echocardiogram) Cardioversion - scheduled 6/6 w/Dr Renna Cary - The following was reviewed with patient - Please arrive at the [facility] (Main  Entrance A) at [facility]: [address] [place], [place] [address] at 8:30 AM (This time is 1 hour(s) before your procedure to ensure your preparation). HOLD JARDIANCE  3 DAYS PRIOR    HOLD METFORMIN  3 DAYS PRIOR patient accidentally took this morning of 07/21/23 7am as he did not realize he was also supposed to hold his Metformin - call placed to cardio office and nurse is to call patient back with instructions and patient will call TOC RN with questions/concerns    HOLD MOUNJARO  3 DAYS PRIOR - Patient takes on Sunday  Continue taking your anticoagulant (blood thinner): Rivaroxaban  (Xarelto ) TOC RN was unable to complete assessment questions related patient stating today is his daughter's birthday and people are there for celebration - he will speak with cardiology office about having taken the Metformin  this morning and agreed to [facility] follow up call Monday 07/26/23 10am  AFIB Interventions:   Reviewed importance of adherence to anticoagulant exactly as prescribed Counseled on bleeding risk associated with Xarelto  and importance of self-monitoring for signs/symptoms of bleeding   Heart Failure Interventions: Provided education on low sodium diet Assessed need for readable accurate scales in home Provided education about placing scale on hard, flat surface Advised patient to weigh each morning after emptying bladder Discussed importance of daily weight and advised patient to weigh and record daily  Diabetes Interventions: Assessed patient's understanding of A1c goal: <6.5% Reviewed medications with patient and discussed importance of medication adherence Discussed plans with patient for ongoing care management follow  up and provided patient with direct contact information for care management team Reviewed scheduled/upcoming provider appointments including: PCP Hospital follow up 07/15/23 Assessed social determinant of health barriers Lab Results  Component Value Date   HGBA1C 5.5  05/24/2023    Patient Self Care Activities:  Attend all scheduled provider appointments Call pharmacy for medication refills 3-7 days in advance of running out of medications Call provider office for new concerns or questions  Notify RN Care Manager of TOC call rescheduling needs Participate in Transition of Care Program/Attend TOC scheduled calls Take medications as prescribed   call office if I gain more than 2 pounds in one day or 5 pounds in one week use salt in moderation watch for swelling in feet, ankles and legs every day weigh myself daily take the blood sugar log to all doctor visits make a plan to eat healthy take medicine as prescribed  Plan:  Telephone follow up appointment with care management team member scheduled for:  07/27/23 10am  The patient has been provided with contact information for the care management team and has been advised to call with any health related questions or concerns.         Patient verbalizes understanding of instructions and care plan provided today and agrees to view in MyChart. Active MyChart status and patient understanding of how to access instructions and care plan via MyChart confirmed with patient.     Telephone follow up appointment with care management team member scheduled for:07/27/23 The patient has been provided with contact information for the care management team and has been advised to call with any health related questions or concerns.   Please call the care guide team at [phone] if you need to cancel or reschedule your appointment.   Please call the Suicide and Crisis Lifeline: 988 call 1-800-273-TALK (toll free, 24 hour hotline) call 911 if you are experiencing a Mental Health or Behavioral Health Crisis or need someone to talk to.  Tonia Frankel RN, CCM [place]  VBCI-Population Health RN Care Manager [phone]

## 2023-07-22 ENCOUNTER — Telehealth: Payer: Self-pay | Admitting: Physician Assistant

## 2023-07-22 ENCOUNTER — Telehealth: Payer: Self-pay

## 2023-07-22 ENCOUNTER — Other Ambulatory Visit: Payer: Self-pay | Admitting: Physician Assistant

## 2023-07-22 DIAGNOSIS — I4891 Unspecified atrial fibrillation: Secondary | ICD-10-CM

## 2023-07-22 NOTE — Telephone Encounter (Signed)
 Patient took Monjarou last Sunday, spoke with Dr. Renna Cary and anesthesiologist, they are willing to do the procedure this Friday if needed, however patient may end up needing to be intubated.  He is on 2-1/2 L of oxygen  at baseline, I fear intubation may increase the risk of the procedure, after discussing with the patient, we decided to push the TEE DCCV back to next Tuesday morning.

## 2023-07-22 NOTE — Transitions of Care (Post Inpatient/ED Visit) (Signed)
 Patient ID: Thomas [name], male   DOB: Dec 20, 1950, 73 y.o.   MRN: [id number]  07/22/2023 [facility] RN reviewed chart related to call to cardiology office 07/21/23 and saw the following note: per 6/5 Note by Ervin Heath: "I gave the patient his instruction, he need to be off of Mounjaro  for 7 days, off of Jardiance  for 3 days, hold metformin  the morning of the procedure. Patient took Monjarou last Sunday, spoke with Dr. Renna Cary and anesthesiologist, they are willing to do the procedure this Friday if needed, however patient may end up needing to be intubated.  He is on 2-1/2 L of oxygen  at baseline, I fear intubation may increase the risk of the procedure, after discussing with the patient, we decided to push the TEE DCCV back to next Tuesday morning." TOC RN called patient to confirm his understanding of above-patient confirmed and agreed to reschedule [facility] RN call to 07/28/23 after TEE Tonia Frankel RN, CCM [place]  VBCI-Population Health RN Care Manager [phone]

## 2023-07-22 NOTE — Telephone Encounter (Signed)
 I gave the patient his instruction, he need to be off of Mounjaro  for 7 days, off of Jardiance  for 3 days, hold metformin  the morning of the procedure.

## 2023-07-26 ENCOUNTER — Other Ambulatory Visit: Payer: Self-pay | Admitting: Physician Assistant

## 2023-07-26 ENCOUNTER — Telehealth: Payer: Self-pay | Admitting: Physician Assistant

## 2023-07-26 ENCOUNTER — Telehealth: Payer: Self-pay | Admitting: Cardiovascular Disease

## 2023-07-26 ENCOUNTER — Encounter (HOSPITAL_COMMUNITY): Payer: Self-pay | Admitting: Cardiovascular Disease

## 2023-07-26 DIAGNOSIS — I4819 Other persistent atrial fibrillation: Secondary | ICD-10-CM

## 2023-07-26 NOTE — Telephone Encounter (Signed)
 Pt c/o medication issue:  1. Name of Medication:   rivaroxaban  (XARELTO ) 20 MG TABS tablet   2. How are you currently taking this medication (dosage and times per day)?   As prescribed  3. Are you having a reaction (difficulty breathing--STAT)?   No  4. What is your medication issue?   Wife Felipa Horsfall) wants to know if patient should continue taking this medication as patient has procedure scheduled for tomorrow (6/10).

## 2023-07-26 NOTE — Telephone Encounter (Signed)
 Returned call to pt- no dpr on file- pt gave verbal instruction to speak with wife! Advised patient to continue Xarelto  and ensure no missed doses.

## 2023-07-26 NOTE — Progress Notes (Signed)
 Pt called for pre procedure instructions. Arrival time 0630 NPO after midnight explained Instructed to take am meds with sip of water and confirmed blood thinner consistency Instructed pt need for ride home tomorrow and have responsible adult with them for 24 hrs post procedure.

## 2023-07-26 NOTE — Telephone Encounter (Signed)
 Case discussed with Dr. Nishan, since patient is 3 weeks out from hospitalization and has been compliant with Xarelto . Plan is to proceed with DCCV without TEE to lower risk associated with his respiratory status. New DCCV without TEE order will be placed.

## 2023-07-26 NOTE — Anesthesia Preprocedure Evaluation (Signed)
 Anesthesia Evaluation  Patient identified by MRN, date of birth, ID band Patient awake    Reviewed: Allergy & Precautions, NPO status , Patient's Chart, lab work & pertinent test results, reviewed documented beta blocker date and time   Airway Mallampati: I  TM Distance: >3 FB     Dental no notable dental hx. (+) Missing, Dental Advisory Given   Pulmonary shortness of breath, with exertion and Long-Term Oxygen  Therapy, sleep apnea , pneumonia, resolved, PE Non compliant with CPAP   breath sounds clear to auscultation + decreased breath sounds      Cardiovascular hypertension, Pt. on medications and Pt. on home beta blockers pulmonary hypertension+ CAD, +CHF and + DVT   Rhythm:Irregular Rate:Normal  EKG 07/20/23 Atrial fibrillation with rapid ventricular response with premature ventricular or aberrantly conducted complexes Abnormal QRS-T angle, consider primary T wave abnormality  Echo 07/04/23 1. Poor Echo images despite Definity .   2. Left ventricular ejection fraction, by estimation, is 40 to 45%. The  left ventricle has mildly decreased function. The left ventricle  demonstrates global hypokinesis. Left ventricular diastolic function could  not be evaluated.   3. Right ventricular systolic function is moderately reduced. The right  ventricular size is normal.   4. The mitral valve is grossly normal. No evidence of mitral valve  regurgitation. No evidence of mitral stenosis.   5. The aortic valve was not well visualized. Aortic valve regurgitation  is not visualized. No aortic stenosis is present.   Cardiac Cath 07/06/17  There is mild left ventricular systolic dysfunction.  LV end diastolic pressure is moderately elevated, 30 mm Hg.  The left ventricular ejection fraction is 45-50% by visual estimate.  There is no aortic valve stenosis.  Hemodynamic findings consistent with moderate to severe pulmonary hypertension.  CO  10.8 L/min; CI 4.2, Ao sat 91%; PA sat 72%; mean PCWP 21 mm Hg  Nonobstructive CAD.  Prox Cx lesion is 25% stenosed.   Non-obstructive CAD.  Moderate to severe pulmonary hypertension.      Neuro/Psych Diabetic neuropathy  negative psych ROS   GI/Hepatic Neg liver ROS,GERD  Medicated,,  Endo/Other  diabetes, Well Controlled, Type 2, Oral Hypoglycemic Agents  Class 3 obesityGLP-1 RA therapy- last dose Hyperlipidemia  Renal/GU Renal InsufficiencyRenal disease Bladder dysfunction  Hx/o urethral stricture with urinary retention Hx/o prostate Ca S/P radioactive seeds    Musculoskeletal  (+) Arthritis , Osteoarthritis,    Abdominal  (+) + obese  Peds  Hematology  (+) Blood dyscrasia, anemia Xarelto  therapy- last dose yesterday pm   Anesthesia Other Findings   Reproductive/Obstetrics                              Anesthesia Physical Anesthesia Plan  ASA: 3  Anesthesia Plan: General   Post-op Pain Management: Minimal or no pain anticipated   Induction: Intravenous  PONV Risk Score and Plan: 3 and Treatment may vary due to age or medical condition and Propofol  infusion  Airway Management Planned: Mask, Natural Airway and Nasal Cannula  Additional Equipment: None  Intra-op Plan:   Post-operative Plan:   Informed Consent: I have reviewed the patients History and Physical, chart, labs and discussed the procedure including the risks, benefits and alternatives for the proposed anesthesia with the patient or authorized representative who has indicated his/her understanding and acceptance.     Dental advisory given  Plan Discussed with: CRNA and Anesthesiologist  Anesthesia Plan Comments:  Anesthesia Quick Evaluation

## 2023-07-27 ENCOUNTER — Encounter (HOSPITAL_COMMUNITY)
Admission: RE | Disposition: A | Discharge status: Home / Self Care | Payer: Self-pay | Source: Home / Self Care | Attending: Cardiovascular Disease

## 2023-07-27 ENCOUNTER — Ambulatory Visit (HOSPITAL_BASED_OUTPATIENT_CLINIC_OR_DEPARTMENT_OTHER): Payer: Self-pay | Admitting: Anesthesiology

## 2023-07-27 ENCOUNTER — Ambulatory Visit (HOSPITAL_COMMUNITY)
Admission: RE | Admit: 2023-07-27 | Discharge: 2023-07-27 | Disposition: A | Discharge status: Home / Self Care | Attending: Cardiovascular Disease | Admitting: Cardiovascular Disease

## 2023-07-27 ENCOUNTER — Ambulatory Visit (HOSPITAL_COMMUNITY): Payer: Self-pay | Admitting: Anesthesiology

## 2023-07-27 ENCOUNTER — Other Ambulatory Visit: Payer: Self-pay

## 2023-07-27 ENCOUNTER — Encounter (HOSPITAL_COMMUNITY): Payer: Self-pay | Admitting: Cardiovascular Disease

## 2023-07-27 DIAGNOSIS — Z8546 Personal history of malignant neoplasm of prostate: Secondary | ICD-10-CM | POA: Diagnosis not present

## 2023-07-27 DIAGNOSIS — I110 Hypertensive heart disease with heart failure: Secondary | ICD-10-CM | POA: Diagnosis not present

## 2023-07-27 DIAGNOSIS — I5022 Chronic systolic (congestive) heart failure: Secondary | ICD-10-CM | POA: Diagnosis not present

## 2023-07-27 DIAGNOSIS — I4891 Unspecified atrial fibrillation: Secondary | ICD-10-CM | POA: Diagnosis not present

## 2023-07-27 DIAGNOSIS — Z79899 Other long term (current) drug therapy: Secondary | ICD-10-CM | POA: Diagnosis not present

## 2023-07-27 DIAGNOSIS — Z9981 Dependence on supplemental oxygen: Secondary | ICD-10-CM | POA: Insufficient documentation

## 2023-07-27 DIAGNOSIS — Z7901 Long term (current) use of anticoagulants: Secondary | ICD-10-CM | POA: Diagnosis not present

## 2023-07-27 DIAGNOSIS — N183 Chronic kidney disease, stage 3 unspecified: Secondary | ICD-10-CM | POA: Diagnosis not present

## 2023-07-27 DIAGNOSIS — R339 Retention of urine, unspecified: Secondary | ICD-10-CM | POA: Insufficient documentation

## 2023-07-27 DIAGNOSIS — I2510 Atherosclerotic heart disease of native coronary artery without angina pectoris: Secondary | ICD-10-CM | POA: Diagnosis not present

## 2023-07-27 DIAGNOSIS — I4819 Other persistent atrial fibrillation: Secondary | ICD-10-CM

## 2023-07-27 DIAGNOSIS — Z6841 Body Mass Index (BMI) 40.0 and over, adult: Secondary | ICD-10-CM | POA: Insufficient documentation

## 2023-07-27 DIAGNOSIS — I130 Hypertensive heart and chronic kidney disease with heart failure and stage 1 through stage 4 chronic kidney disease, or unspecified chronic kidney disease: Secondary | ICD-10-CM

## 2023-07-27 DIAGNOSIS — J9610 Chronic respiratory failure, unspecified whether with hypoxia or hypercapnia: Secondary | ICD-10-CM | POA: Insufficient documentation

## 2023-07-27 DIAGNOSIS — Z86711 Personal history of pulmonary embolism: Secondary | ICD-10-CM | POA: Diagnosis not present

## 2023-07-27 DIAGNOSIS — E119 Type 2 diabetes mellitus without complications: Secondary | ICD-10-CM | POA: Insufficient documentation

## 2023-07-27 DIAGNOSIS — I5020 Unspecified systolic (congestive) heart failure: Secondary | ICD-10-CM | POA: Diagnosis not present

## 2023-07-27 DIAGNOSIS — E1122 Type 2 diabetes mellitus with diabetic chronic kidney disease: Secondary | ICD-10-CM | POA: Diagnosis not present

## 2023-07-27 DIAGNOSIS — N1832 Chronic kidney disease, stage 3b: Secondary | ICD-10-CM | POA: Diagnosis not present

## 2023-07-27 DIAGNOSIS — I129 Hypertensive chronic kidney disease with stage 1 through stage 4 chronic kidney disease, or unspecified chronic kidney disease: Secondary | ICD-10-CM | POA: Diagnosis not present

## 2023-07-27 DIAGNOSIS — G4733 Obstructive sleep apnea (adult) (pediatric): Secondary | ICD-10-CM | POA: Insufficient documentation

## 2023-07-27 HISTORY — PX: CARDIOVERSION: EP1203

## 2023-07-27 LAB — GLUCOSE, CAPILLARY: Glucose-Capillary: 110 mg/dL — ABNORMAL HIGH (ref 70–99)

## 2023-07-27 SURGERY — CARDIOVERSION (CATH LAB)
Anesthesia: General

## 2023-07-27 MED ORDER — SODIUM CHLORIDE 0.9% FLUSH: 3.0000 mL | INTRAVENOUS | Status: DC | PRN

## 2023-07-27 MED ORDER — LIDOCAINE 2% (20 MG/ML) 5 ML SYRINGE
INTRAMUSCULAR | Status: DC | PRN
Start: 2023-07-27 — End: 2023-07-27
  Administered 2023-07-27: 20 mg via INTRAVENOUS

## 2023-07-27 MED ORDER — PROPOFOL 10 MG/ML IV BOLUS
INTRAVENOUS | Status: DC | PRN
  Administered 2023-07-27: 50 mg via INTRAVENOUS

## 2023-07-27 MED ORDER — SODIUM CHLORIDE 0.9% FLUSH: 3.0000 mL | Freq: Two times a day (BID) | INTRAVENOUS | Status: DC

## 2023-07-27 SURGICAL SUPPLY — 1 items: PAD DEFIB RADIO PHYSIO CONN (PAD) ×2 IMPLANT

## 2023-07-27 NOTE — Anesthesia Postprocedure Evaluation (Signed)
 Anesthesia Post Note  Patient: Thomas [name]  Procedure(s) Performed: CARDIOVERSION     Patient location during evaluation: PACU Anesthesia Type: General Level of consciousness: awake and alert and oriented Pain management: pain level controlled Vital Signs Assessment: post-procedure vital signs reviewed and stable Respiratory status: spontaneous breathing, nonlabored ventilation, respiratory function stable and patient connected to nasal cannula oxygen  Cardiovascular status: blood pressure returned to baseline and stable Postop Assessment: no apparent nausea or vomiting Anesthetic complications: no   No notable events documented.  Last Vitals:  Vitals:   07/27/23 0802 07/27/23 0806  BP: 108/81 124/82  Pulse: 72 62  Resp: 18 (!) 23  Temp:    SpO2: (!) 88% 93%    Last Pain:  Vitals:   07/27/23 0806  TempSrc:   PainSc: 0-No pain                 Dionta Larke A.

## 2023-07-27 NOTE — CV Procedure (Signed)
 DCC: On Rx xarelto  no missed doses for 3 weeks Anesthesia: Propofol   DCC x 1 300 J synch Converted from afib rate 98 to NSR with PAC rates 74 bpm  No immediate neurologic sequelae  Janelle Mediate MD [facility]

## 2023-07-27 NOTE — Interval H&P Note (Signed)
 History and Physical Interval Note:  07/27/2023 8:05 AM  Thomas [name]  has presented today for surgery, with the diagnosis of PERSISTENT AFIB WITH IVR.  The various methods of treatment have been discussed with the patient and family. After consideration of risks, benefits and other options for treatment, the patient has consented to  Procedure(s): CARDIOVERSION (N/A) as a surgical intervention.  The patient's history has been reviewed, patient examined, no change in status, stable for surgery.  I have reviewed the patient's chart and labs.  Questions were answered to the patient's satisfaction.     Janelle Mediate

## 2023-07-27 NOTE — Transfer of Care (Signed)
 Immediate Anesthesia Transfer of Care Note  Patient: Thomas [name]  Procedure(s) Performed: CARDIOVERSION  Patient Location: PACU  Anesthesia Type:General  Level of Consciousness: drowsy, patient cooperative, and responds to stimulation  Airway & Oxygen  Therapy: Patient Spontanous Breathing and Patient connected to nasal cannula oxygen   Post-op Assessment: Report given to RN and Post -op Vital signs reviewed and stable  Post vital signs: Reviewed and stable  Last Vitals:  Vitals Value Taken Time  BP    Temp    Pulse 98 07/27/23 0750  Resp 19 07/27/23 0750  SpO2 92 % 07/27/23 0750  Vitals shown include unfiled device data.  Last Pain:  Vitals:   07/27/23 0753  TempSrc:   PainSc: 0-No pain         Complications: No notable events documented.

## 2023-07-28 ENCOUNTER — Other Ambulatory Visit: Payer: Self-pay

## 2023-07-28 ENCOUNTER — Telehealth: Payer: Self-pay | Admitting: Internal Medicine

## 2023-07-28 NOTE — Transitions of Care (Post Inpatient/ED Visit) (Signed)
 Transition of Care week 4  Visit Note  07/28/2023  Name: Thomas [name] MRN: [id number]          DOB: [birth year]  Situation: Patient enrolled in [facility] 30-day program. Visit completed with patient and wife by telephone.   Background: Patient is being followed by [facility] RN related to hospitalization 5/16 - 5/20 Arlin Benes    Primary Diagnosis: Atrial fibrillation with RVR. Patient/wife confirm TEE cardioversion completed 07/27/23 as planned and patient states he is feeling better today/still on O2, but patient states he did not receive an AVS and did not receive any instructions. TOC RN placed conference call with wife to office of Dr Janelle Mediate - staff advised that patient did not receive AVS/instructions - staff took wife's number and said she will have some to call and will advise to send AVS to patients My Chart. Wife agreed to [facility] RN calling back tomorrow to assess outcome.    Initial Transition Care Management Follow-up Telephone Call    Past Medical History:  Diagnosis Date   Acute on chronic diastolic CHF (congestive heart failure) (HCC) 06/01/2017   Arthritis    Cancer (HCC) 2010   Prostate   Chronic kidney disease    Diabetes mellitus    Diabetic neuropathy (HCC)    feet   DVT (deep venous thrombosis) (HCC)    Genetic testing 06/22/2016   Mr. Empson underwent genetic counseling and testing for hereditary cancer syndromes on 05/14/2016. His results were negative for mutations in all 46 genes analyzed by [facility]'s 46-gene Common Hereditary Cancers Panel. Genes analyzed include: APC, ATM, AXIN2, BARD1, BMPR1A, BRCA1, BRCA2, BRIP1, CDH1, CDKN2A, CHEK2, CTNNA1, DICER1, EPCAM, GREM1, HOXB13, KIT, MEN1, MLH1, MSH2, MSH3, MSH6, MUTYH, NB   GERD (gastroesophageal reflux disease)    Hypertension    PE (pulmonary thromboembolism) (HCC)    Pneumonia    Sleep apnea    not wearing CPAP   Suprapubic catheter (HCC) 11/17/2019    Assessment: Patient Reported Symptoms: Assessment not  completed today - wife priority is discussing AVS/instructions and agrees to call back tomorrow 07/29/23  There were no vitals filed for this visit.  Medications Reviewed Today     Reviewed by Sharmaine Dearth, RN (Registered Nurse) on 07/28/23 at 1017  Med List Status: <None>   Medication Order Taking? Sig Documenting Provider Last Dose Status Informant  Accu-Chek Softclix Lancets lancets [id number] Yes Use to test blood sugar daily Verma Gobble, NP Taking Active Self, Pharmacy Records  Alcohol  Swabs (B-D SINGLE USE SWABS REGULAR) PADS [id number] Yes Use in testing blood sugar daily. Dx: E11.22 Verma Gobble, NP Taking Active Self, Pharmacy Records  aspirin  EC 81 MG tablet [id number] Yes Take 81 mg by mouth daily. [provider] Taking Active Self, Pharmacy Records  atorvastatin  (LIPITOR) 40 MG tablet [id number] Yes TAKE 1 TABLET EVERY DAY  Patient taking differently: Take 40 mg by mouth daily. Patient is taking daily in the evening   Roselie Conger, Jessica K, NP Taking Active Self, Pharmacy Records           Med Note Karolynn Pack   OZD Jul 03, 2023  2:32 PM) LF: 05/13/23 for a 90ds  Blood Glucose Calibration (ACCU-CHEK AVIVA) SOLN [id number] Yes Use once daily as directed dx E11.22 Verma Gobble, NP Taking Active Self, Pharmacy Records  Blood Glucose Monitoring Suppl (TRUE METRIX AIR GLUCOSE METER) w/Device KIT [id number] Yes 1 Device by Does not apply route daily as needed. E11.22 Verma Gobble,  NP Taking Active Self, Pharmacy Records  calcium  carbonate (TUMS - DOSED IN MG ELEMENTAL CALCIUM ) 500 MG chewable tablet [id number] Yes Chew 2 tablets by mouth as needed for indigestion or heartburn. [provider] Taking Active Self, Pharmacy Records  Cholecalciferol  (VITAMIN D3) 50 MCG (2000 UT) TABS [id number] No Take 1 tablet (2,000 Units) by mouth daily.  Patient not taking: Reported on 07/21/2023    Not Taking Active Self, Pharmacy Records  empagliflozin   (JARDIANCE ) 10 MG TABS tablet [id number] Yes Take 1 tablet (10 mg total) by mouth daily. Verma Gobble, NP Taking Active Self, Pharmacy Records           Med Note [facility], [place] A   Wed Jul 28, 2023 10:03 AM) Patient restarted after cardioversion  furosemide  (LASIX ) 20 MG tablet [id number] Yes TAKE 1 TABLET AS DIRECTED AS NEEDED  Patient taking differently: Take 20 mg by mouth in the morning.   Gabino Joe, PA-C Taking Active Self, Pharmacy Records           Med Note Karolynn Pack   KGM Jul 03, 2023  2:33 PM) LF: 05/13/23 for a 90ds  glucose blood (ACCU-CHEK AVIVA PLUS) test strip [id number] Yes Use to test blood sugar daily. Verma Gobble, NP Taking Active Self, Pharmacy Records  hydrALAZINE  (APRESOLINE ) 25 MG tablet [id number] Yes TAKE 3 TABLETS THREE TIMES DAILY  Patient taking differently: Take 75 mg by mouth 3 (three) times daily.   Verma Gobble, NP Taking Active Self, Pharmacy Records           Med Note [facility], [facility] M   Thu Jul 15, 2023 11:05 AM)    Lancets Misc. (ACCU-CHEK SOFTCLIX LANCET DEV) KIT [id number] Yes Use to test blood sugar daily Verma Gobble, NP Taking Active Self, Pharmacy Records  losartan  (COZAAR ) 50 MG tablet [id number] Yes TAKE 1 TABLET EVERY DAY  Patient taking differently: Take 50 mg by mouth in the morning.   Verma Gobble, NP Taking Active Self, Pharmacy Records           Med Note Karolynn Pack   CZY Jul 03, 2023  2:34 PM) LF: 04/04/23 for a 90ds  metFORMIN  (GLUCOPHAGE ) 500 MG tablet [id number] Yes TAKE 1 TABLET TWICE DAILY WITH MEALS Roselie Conger Jessica K, NP Taking Active Self, Pharmacy Records           Med Note [facility] [place] A   Wed Jul 28, 2023 10:01 AM) Patient resumed as directed  metoprolol  tartrate (LOPRESSOR ) 100 MG tablet [id number] Yes Take 1 tablet (100 mg total) by mouth 2 (two) times daily. Meng, Hao, [place] Taking Active   Multiple Vitamin (MULTIVITAMIN ADULT PO) [id number] No Take 2 each by mouth as  needed.  Patient not taking: Reported on 07/28/2023   [provider] Not Taking Active Self, Pharmacy Records  potassium chloride  SA (KLOR-CON  M) 20 MEQ tablet [id number] Yes TAKE 1 TABLET TWICE DAILY  Patient taking differently: Take 20 mEq by mouth in the morning.   Verma Gobble, NP Taking Active Self, Pharmacy Records           Med Note Karolynn Pack   WCB Jul 03, 2023  3:11 PM) LF: 04/07/23 for a 90ds  rivaroxaban  (XARELTO ) 20 MG TABS tablet [id number] Yes Take 1 tablet (20 mg total) by mouth daily with supper. Pokhrel, Laxman, MD Taking Active   Senna 8.7 MG CHEW [id number] Yes Chew 1 tablet by mouth as needed. Verma Gobble, NP  Taking Active Self, Pharmacy Records  spironolactone  (ALDACTONE ) 25 MG tablet [id number] Yes TAKE 1 TABLET EVERY DAY  Patient taking differently: Take 50 mg by mouth in the morning.   Lucendia Rusk, MD Taking Active Self, Pharmacy Records           Med Note Karolynn Pack   UUV Jul 03, 2023  2:35 PM) LF: 06/11/23 for a 90ds  tirzepatide  (MOUNJARO ) 15 MG/0.5ML Pen [id number] Yes Inject 15 mg into the skin once a week. Jann Melody, MD Taking Active            Med Note [facility], [place] A   Wed Jul 21, 2023  2:13 PM) On hold 3 days prior to 07/23/23  TRUEplus Lancets 28G MISC [id number] Yes TEST BLOOD SUGAR EVERY DAY AS NEEDED Verma Gobble, NP Taking Active Self, Pharmacy Records  UNABLE TO FIND [id number] No 500 mg. Med Name: vitmain C  Patient not taking: Reported on 07/28/2023   [provider] Not Taking Active   zinc  gluconate 50 MG tablet [id number] No Take 1 tablet (50 mg total) by mouth daily.  Patient not taking: Reported on 07/28/2023   Ngetich, Elijio [place], NP Not Taking Active Self, Pharmacy Records            Recommendation:   Continue Current Plan of Care  Follow Up Plan:   Telephone follow up appointment date/time:  07/29/23 10am  Tonia Frankel RN, CCM [place]  VBCI-Population  Health RN Care Manager [phone]

## 2023-07-28 NOTE — Telephone Encounter (Signed)
 Called and spoke with patient and his spouse Thomas [name].  They are asking what medications should he resume and when should he F/U following DCCV yesterday.  Reviewed AVS with patient, he is to resume all medications as prescribed, no changes made. He is scheduled to F/U with Dr. Paulita Boss on 7/10.  Patient and Thomas [name] verbalized understanding and expressed appreciation for call.

## 2023-07-28 NOTE — Telephone Encounter (Signed)
  Pt's wife called said they did not receive a instruction after the pt's cardioversion yesterday. They wanted to know what the pt need to do next

## 2023-07-29 ENCOUNTER — Other Ambulatory Visit: Payer: Self-pay

## 2023-07-29 NOTE — Transitions of Care (Post Inpatient/ED Visit) (Signed)
 07/29/2023 Call attempted. LVM. Call back from wife immediately stating patient's phone has a virus and she's trying to resolve issue and requested to reschedule to 08/05/23 10am and states she did hear back from cardiology and questions were answered  Patient ID: ZION TA, male   DOB: [birth year], 73 y.o.   MRN: [id number]  Tonia Frankel RN, CCM [place]  VBC[facility]-Population Health RN Care Manager [phone]

## 2023-08-02 ENCOUNTER — Encounter: Payer: Self-pay | Admitting: Nurse Practitioner

## 2023-08-02 ENCOUNTER — Ambulatory Visit
Admission: RE | Admit: 2023-08-02 | Discharge: 2023-08-02 | Disposition: A | Discharge status: Home / Self Care | Source: Ambulatory Visit | Attending: Nurse Practitioner | Admitting: Nurse Practitioner

## 2023-08-02 ENCOUNTER — Ambulatory Visit (INDEPENDENT_AMBULATORY_CARE_PROVIDER_SITE_OTHER): Admitting: Nurse Practitioner

## 2023-08-02 ENCOUNTER — Telehealth: Payer: Self-pay | Admitting: Internal Medicine

## 2023-08-02 ENCOUNTER — Telehealth: Payer: Self-pay

## 2023-08-02 ENCOUNTER — Telehealth

## 2023-08-02 ENCOUNTER — Other Ambulatory Visit (HOSPITAL_COMMUNITY): Payer: Self-pay

## 2023-08-02 VITALS — BP 132/84 | HR 74 | Temp 97.4°F | Wt 319.2 lb

## 2023-08-02 DIAGNOSIS — N183 Chronic kidney disease, stage 3 unspecified: Secondary | ICD-10-CM

## 2023-08-02 DIAGNOSIS — E1122 Type 2 diabetes mellitus with diabetic chronic kidney disease: Secondary | ICD-10-CM

## 2023-08-02 DIAGNOSIS — J9611 Chronic respiratory failure with hypoxia: Secondary | ICD-10-CM

## 2023-08-02 DIAGNOSIS — N1832 Chronic kidney disease, stage 3b: Secondary | ICD-10-CM

## 2023-08-02 DIAGNOSIS — I129 Hypertensive chronic kidney disease with stage 1 through stage 4 chronic kidney disease, or unspecified chronic kidney disease: Secondary | ICD-10-CM | POA: Diagnosis not present

## 2023-08-02 DIAGNOSIS — D509 Iron deficiency anemia, unspecified: Secondary | ICD-10-CM | POA: Diagnosis not present

## 2023-08-02 DIAGNOSIS — I4891 Unspecified atrial fibrillation: Secondary | ICD-10-CM

## 2023-08-02 DIAGNOSIS — I5022 Chronic systolic (congestive) heart failure: Secondary | ICD-10-CM | POA: Diagnosis not present

## 2023-08-02 DIAGNOSIS — I517 Cardiomegaly: Secondary | ICD-10-CM | POA: Diagnosis not present

## 2023-08-02 DIAGNOSIS — R0602 Shortness of breath: Secondary | ICD-10-CM | POA: Diagnosis not present

## 2023-08-02 DIAGNOSIS — N400 Enlarged prostate without lower urinary tract symptoms: Secondary | ICD-10-CM

## 2023-08-02 MED ORDER — SPIRONOLACTONE 25 MG PO TABS: 25.0000 mg | ORAL_TABLET | Freq: Every day | ORAL | Status: DC

## 2023-08-02 NOTE — Telephone Encounter (Signed)
 Wife Felipa Horsfall) called to report patient has been having low oxygen  levels (85-91) since his cardioversion.  Wife noted patient had an echocardiogram and a call back to discuss results and next steps.

## 2023-08-02 NOTE — Progress Notes (Signed)
 Careteam: Patient Care Team: Verma Gobble, NP as PCP - General (Geriatric Medicine) Jann Melody, MD as PCP - Cardiology (Cardiology) Nathen Balder Skeeter Dukes, RN as [facility] Management Sherwood Donath as Physician Assistant (Cardiology) Aminta Kales, MD as Consulting Physician (Ophthalmology) Sharmaine Dearth, RN as Registered Nurse  PLACE OF SERVICE:  [facility]  Advanced Directive information    Allergies  Allergen Reactions   Lisinopril  Cough and Other (See Comments)    Muscle pain    Chief Complaint  Patient presents with   Medical Management of Chronic Issues    Medical Management of Chronic Issues. 2 Month follow up. To discuss need for Zoster, Covid, Tdap and Diabetic Kidney eval    HPI:  Discussed the use of AI scribe software for clinical note transcription with the patient, who gave verbal consent to proceed.  History of Present Illness Thomas [name] is a 73 year old male with atrial fibrillation who presents for a routine follow-up after recent cardioversion.  He recently underwent cardioversion for atrial fibrillation on July 27, 2023, and is scheduled for a follow-up with his cardiologist on August 26, 2023. He feels that his heart rhythm is regular, although he experiences persistent shortness of breath. Which has been ongoing but worsened over the last week.   His oxygen  levels have been low despite using supplemental oxygen . He has been on home oxygen  at 2 liters, but recently increased to 3 liters. His oxygen  levels were satisfactory upon discharge from the hospital in May 2025 but have since declined. He experiences shortness of breath, especially when exerting himself, such as climbing stairs. No fever, increased cough, or significant swelling. He has a history of hospitalization from May 16 to Jul 06, 2023, for heart-related issues and shortness of breath.  His current medications include metoprolol  100 mg twice daily, Xarelto  as  prescribed, spironolactone  25 mg daily, Lasix  20 mg daily, hydralazine  25 mg three times a day, Jardiance , metformin  500 mg twice daily, and Mounjaro . He was previously on amlodipine  and carvedilol , which have been discontinued.   He continues to use a CPAP machine at night, now in conjunction with supplemental oxygen . He has a suprapubic catheter with normal urine output. No significant changes in weight recently, although he has not been weighing himself regularly. His A1c is well-controlled at 5.5, and his thyroid  function is normal.  He experiences tightness in his chest and shortness of breath, which has worsened over the past few days. No pain when taking deep breaths but notes a feeling of tightness in his diaphragm. He has been spitting more frequently, indicating some congestion. No LE edema.     Review of Systems:  Review of Systems  Constitutional:  Negative for chills, fever and weight loss.  HENT:  Negative for tinnitus.   Respiratory:  Positive for shortness of breath. Negative for cough and sputum production.   Cardiovascular:  Negative for chest pain, palpitations and leg swelling.  Gastrointestinal:  Negative for abdominal pain, constipation, diarrhea and heartburn.  Genitourinary:  Negative for dysuria, frequency and urgency.  Musculoskeletal:  Negative for back pain, falls, joint pain and myalgias.  Skin: Negative.   Neurological:  Negative for dizziness and headaches.  Psychiatric/Behavioral:  Negative for depression and memory loss. The patient does not have insomnia.     Past Medical History:  Diagnosis Date   Acute on chronic diastolic CHF (congestive heart failure) (HCC) 06/01/2017   Arthritis    Cancer (HCC)  2010   Prostate   Chronic kidney disease    Diabetes mellitus    Diabetic neuropathy (HCC)    feet   DVT (deep venous thrombosis) (HCC)    Genetic testing 06/22/2016   Mr. Mcmeen underwent genetic counseling and testing for hereditary cancer syndromes on  05/14/2016. His results were negative for mutations in all 46 genes analyzed by [facility]'s 46-gene Common Hereditary Cancers Panel. Genes analyzed include: APC, ATM, AXIN2, BARD1, BMPR1A, BRCA1, BRCA2, BRIP1, CDH1, CDKN2A, CHEK2, CTNNA1, DICER1, EPCAM, GREM1, HOXB13, KIT, MEN1, MLH1, MSH2, MSH3, MSH6, MUTYH, NB   GERD (gastroesophageal reflux disease)    Hypertension    PE (pulmonary thromboembolism) (HCC)    Pneumonia    Sleep apnea    not wearing CPAP   Suprapubic catheter (HCC) 11/17/2019   Past Surgical History:  Procedure Laterality Date   CARDIOVERSION N/A 06/03/2017   Procedure: CARDIOVERSION;  Surgeon: Hugh Madura, MD;  Location: [facility] ENDOSCOPY;  Service: Cardiovascular;  Laterality: N/A;   CARDIOVERSION N/A 07/27/2023   Procedure: CARDIOVERSION;  Surgeon: Loyde Rule, MD;  Location: [facility] INVASIVE CV LAB;  Service: Cardiovascular;  Laterality: N/A;   CATARACT EXTRACTION Left 07/31/2021   Dr.Glenn, [facility]   COLONOSCOPY     COLONOSCOPY WITH PROPOFOL  N/A 10/20/2018   Procedure: COLONOSCOPY WITH PROPOFOL ;  Surgeon: Lindle Rhea, MD;  Location: [facility] ENDOSCOPY;  Service: Gastroenterology;  Laterality: N/A;   HERNIA REPAIR     KNEE SURGERY     MULTIPLE TOOTH EXTRACTIONS     POLYPECTOMY  10/20/2018   Procedure: POLYPECTOMY;  Surgeon: Lindle Rhea, MD;  Location: [facility] ENDOSCOPY;  Service: Gastroenterology;;   PROSTATE SURGERY     RADIOACTIVE SEED IMPLANT     RIGHT/LEFT HEART CATH AND CORONARY ANGIOGRAPHY N/A 07/06/2017   Procedure: RIGHT/LEFT HEART CATH AND CORONARY ANGIOGRAPHY;  Surgeon: Lucendia Rusk, MD;  Location: [facility] INVASIVE CV LAB;  Service: Cardiovascular;  Laterality: N/A;   SHOULDER SURGERY     TOTAL KNEE ARTHROPLASTY Right 11/19/2016   Procedure: RIGHT TOTAL KNEE ARTHROPLASTY;  Surgeon: Wes Hamman, MD;  Location: [facility] OR;  Service: Orthopedics;  Laterality: Right;   uretha surgery-2014     Social History:   reports that he has never smoked. He  has never been exposed to tobacco smoke. He has never used smokeless tobacco. He reports that he does not drink alcohol  and does not use drugs.  Family History  Problem Relation Age of Onset   Breast cancer Mother 19       d.89   Breast cancer Sister 30       d.30   Leukemia Brother 18       d.20   Breast cancer Maternal Aunt 40       d.40s   Lung cancer Maternal Uncle    Prostate cancer Paternal Uncle    Prostate cancer Brother        recurred recently at age 53   Other Brother 15       spinal tumor   Cervical cancer Other 22       d.22   Cancer Sister 70       unspecified type   Cancer Maternal Uncle 24       unspecified type    Medications: Patient's Medications  New Prescriptions   No medications on file  Previous Medications   ACCU-CHEK SOFTCLIX LANCETS LANCETS    Use to test blood sugar daily   ALCOHOL  SWABS (B-D SINGLE USE  SWABS REGULAR) PADS    Use in testing blood sugar daily. Dx: E11.22   ASPIRIN  EC 81 MG TABLET    Take 81 mg by mouth daily.   ATORVASTATIN  (LIPITOR) 40 MG TABLET    TAKE 1 TABLET EVERY DAY   BLOOD GLUCOSE CALIBRATION (ACCU-CHEK AVIVA) SOLN    Use once daily as directed dx E11.22   BLOOD GLUCOSE MONITORING SUPPL (TRUE METRIX AIR GLUCOSE METER) W/DEVICE KIT    1 Device by Does not apply route daily as needed. E11.22   CALCIUM  CARBONATE (TUMS - DOSED IN MG ELEMENTAL CALCIUM ) 500 MG CHEWABLE TABLET    Chew 2 tablets by mouth as needed for indigestion or heartburn.   CHOLECALCIFEROL  (VITAMIN D3) 50 MCG (2000 UT) TABS    Take 1 tablet (2,000 Units) by mouth daily.   EMPAGLIFLOZIN  (JARDIANCE ) 10 MG TABS TABLET    Take 1 tablet (10 mg total) by mouth daily.   FUROSEMIDE  (LASIX ) 20 MG TABLET    TAKE 1 TABLET AS DIRECTED AS NEEDED   GLUCOSE BLOOD (ACCU-CHEK AVIVA PLUS) TEST STRIP    Use to test blood sugar daily.   HYDRALAZINE  (APRESOLINE ) 25 MG TABLET    TAKE 3 TABLETS THREE TIMES DAILY   LANCETS MISC. (ACCU-CHEK SOFTCLIX LANCET DEV) KIT    Use to test  blood sugar daily   LOSARTAN  (COZAAR ) 50 MG TABLET    TAKE 1 TABLET EVERY DAY   METFORMIN  (GLUCOPHAGE ) 500 MG TABLET    TAKE 1 TABLET TWICE DAILY WITH MEALS   METOPROLOL  TARTRATE (LOPRESSOR ) 100 MG TABLET    Take 1 tablet (100 mg total) by mouth 2 (two) times daily.   MULTIPLE VITAMIN (MULTIVITAMIN ADULT PO)    Take 2 each by mouth as needed.   POTASSIUM CHLORIDE  SA (KLOR-CON  M) 20 MEQ TABLET    TAKE 1 TABLET TWICE DAILY   RIVAROXABAN  (XARELTO ) 20 MG TABS TABLET    Take 1 tablet (20 mg total) by mouth daily with supper.   SENNA 8.7 MG CHEW    Chew 1 tablet by mouth as needed.   TIRZEPATIDE  (MOUNJARO ) 15 MG/0.5ML PEN    Inject 15 mg into the skin once a week.   TRUEPLUS LANCETS 28G MISC    TEST BLOOD SUGAR EVERY DAY AS NEEDED   UNABLE TO FIND    500 mg. Med Name: vitmain C   ZINC  GLUCONATE 50 MG TABLET    Take 1 tablet (50 mg total) by mouth daily.  Modified Medications   Modified Medication Previous Medication   SPIRONOLACTONE  (ALDACTONE ) 25 MG TABLET spironolactone  (ALDACTONE ) 25 MG tablet      Take 1 tablet (25 mg total) by mouth daily.    TAKE 1 TABLET EVERY DAY  Discontinued Medications   PRAVASTATIN (PRAVACHOL) 10 MG TABLET    Take 10 mg by mouth 3 (three) times a week. Monday, Wednesday, and Friday.    Physical Exam:  Vitals:   08/02/23 1026 08/02/23 1105  BP: 132/84   Pulse: 74   Temp: (!) 97.4 F (36.3 C)   SpO2: (!) 89% 91%  Weight: (!) 319 lb 3.2 oz (144.8 kg)    Body mass index is 47.14 kg/m. Wt Readings from Last 3 Encounters:  08/02/23 (!) 319 lb 3.2 oz (144.8 kg)  07/20/23 (!) 307 lb (139.3 kg)  07/15/23 300 lb (136.1 kg)    Physical Exam Constitutional:      General: He is not in acute distress.    Appearance: He is  well-developed. He is not diaphoretic.  HENT:     Head: Normocephalic and atraumatic.     Right Ear: External ear normal.     Left Ear: External ear normal.     Mouth/Throat:     Pharynx: No oropharyngeal exudate.   Eyes:      Conjunctiva/sclera: Conjunctivae normal.     Pupils: Pupils are equal, round, and reactive to light.    Cardiovascular:     Rate and Rhythm: Normal rate and regular rhythm.     Heart sounds: Normal heart sounds.  Pulmonary:     Effort: Pulmonary effort is normal.     Breath sounds: Normal breath sounds.     Comments: Diminished throughout Abdominal:     General: Bowel sounds are normal.     Palpations: Abdomen is soft.   Musculoskeletal:        General: No tenderness.     Cervical back: Normal range of motion and neck supple.     Right lower leg: No edema.     Left lower leg: No edema.   Skin:    General: Skin is warm and dry.   Neurological:     Mental Status: He is alert and oriented to person, place, and time.     Labs reviewed: Basic Metabolic Panel: Recent Labs    07/02/23 2309 07/03/23 1730 07/04/23 0455 07/05/23 0546 07/06/23 0955 07/15/23 1206  NA 141  --    < > 139 141 145  K 4.1  --    < > 4.1 4.4 4.5  CL 107  --    < > 109 109 109  CO2 20*  --    < > 23 22 26   GLUCOSE 156*  --    < > 130* 123* 111*  BUN 27*  --    < > 26* 27* 27*  CREATININE 1.95*  --    < > 1.65* 1.58* 1.77*  CALCIUM  9.1  --    < > 8.3* 8.6* 8.8  MG 2.1  --   --  2.2  --  2.0  TSH  --  1.058  --   --   --   --    < > = values in this interval not displayed.   Liver Function Tests: Recent Labs    11/03/22 1000 05/24/23 1034  AST 21 12  ALT 36 14  ALKPHOS 94  --   BILITOT 0.6 0.6  PROT 7.3 7.4  ALBUMIN 4.2  --    No results for input(s): LIPASE, AMYLASE in the last 8760 hours. No results for input(s): AMMONIA in the last 8760 hours. CBC: Recent Labs    05/24/23 1034 07/02/23 2309 07/05/23 0546 07/06/23 0955 07/15/23 1206  WBC 9.7   < > 8.3 7.6 9.9  NEUTROABS 7,430  --   --   --  7,999*  HGB 14.2   < > 14.1 15.0 13.2  HCT 44.2   < > 44.2 46.1 41.3  MCV 88.9   < > 91.3 90.0 90.6  PLT 281   < > 239 252 273   < > = values in this interval not displayed.    Lipid Panel: Recent Labs    11/03/22 1000  CHOL 109  HDL 46  LDLCALC 48  TRIG 75  CHOLHDL 2.4   TSH: Recent Labs    07/03/23 1730  TSH 1.058   A1C: Lab Results  Component Value Date   HGBA1C 5.5 05/24/2023  Assessment/Plan Heart failure with mildly reduced ejection fraction (HFmrEF) (HCC) Assessment & Plan: Appears euvolemic at this time however more shortness of breath Will get chest xray Bnp Continues on aldactone , lasix  and metoprolol    Orders: -     COMPLETE METABOLIC PANEL WITHOUT GFR -     Spironolactone ; Take 1 tablet (25 mg total) by mouth daily. -     Brain natriuretic peptide -     CBC with Differential/Platelet  Atrial fibrillation with RVR (HCC) Assessment & Plan: In sinus at this time. Rate controlled. Continues on metoprolol  100 mg by mouth twice daily Continues on xarelto  daily   Benign hypertension with chronic kidney disease, stage III (HCC) Assessment & Plan: Blood pressure well controlled, goal bp <140/90 Continue current medications and dietary modifications follow metabolic panel   Type 2 diabetes mellitus with stage 3b chronic kidney disease, without long-term current use of insulin  (HCC) Assessment & Plan: A1c controlled Continue current regimen with dietary modifications  Orders: -     Microalbumin / creatinine urine ratio  Chronic respiratory failure with hypoxia (HCC) Assessment & Plan: Worsening shortness of breath that has progressed over the last week since he had cardioversion  No LE edema, sleeps flat without worsening shortness of breath O2 ranging from 88-92 in office on [address] at 3L Will get labs and chest xray today but to go to the hospital if symptoms worsen.   Orders: -     Brain natriuretic peptide -     CBC with Differential/Platelet -     D-dimer, quantitative -     DG Chest 2 View; Future  Benign prostatic hyperplasia, unspecified whether lower urinary tract symptoms present Assessment &  Plan: Continues with suprapubic, urinary output has been stable.    Iron  deficiency anemia, unspecified iron  deficiency anemia type Assessment & Plan: Will follow up cbc      Return in about 3 months (around 11/02/2023) for routine follow up.  Leyda Vanderwerf K. Denney Fisherman [facility] [phone]

## 2023-08-02 NOTE — Transitions of Care (Post Inpatient/ED Visit) (Signed)
 Patient ID: Thomas [name], male   DOB: 06-25-1950, 73 y.o.   MRN: [id number]  08/02/2023: Voicemail received from patient's wife stating patient's O2 sats have been around 88-90% on oxygen  and that he had PCP appointment today and blood work was done and is requesting callback - Call to wife who confirmed labs done and chest xray done - No word yet of results - TOC RN reviewed office visit note - wife asked if she should do anything else - [facility] RN reviewed with wife that office visit note states if worsens to go to ED - and that she could also call cardiology office to make them aware since he just had cardioversion 07/27/23 - wife ended our call to call and notify cardiology Tonia Frankel RN, CCM [place]  VBCI-Population Health RN Care Manager [phone]

## 2023-08-02 NOTE — Assessment & Plan Note (Signed)
 Appears euvolemic at this time however more shortness of breath Will get chest xray Bnp Continues on aldactone , lasix  and metoprolol 

## 2023-08-02 NOTE — Assessment & Plan Note (Signed)
 Continues with suprapubic, urinary output has been stable.

## 2023-08-02 NOTE — Assessment & Plan Note (Signed)
 In sinus at this time. Rate controlled. Continues on metoprolol  100 mg by mouth twice daily Continues on xarelto  daily

## 2023-08-02 NOTE — Assessment & Plan Note (Signed)
 Blood pressure well controlled, goal bp <140/90 Continue current medications and dietary modifications follow metabolic panel

## 2023-08-02 NOTE — Assessment & Plan Note (Signed)
 A1c controlled Continue current regimen with dietary modifications

## 2023-08-02 NOTE — Assessment & Plan Note (Signed)
 Worsening shortness of breath that has progressed over the last week since he had cardioversion  No LE edema, sleeps flat without worsening shortness of breath O2 ranging from 88-92 in office on [address] at 3L Will get labs and chest xray today but to go to the hospital if symptoms worsen.

## 2023-08-02 NOTE — Telephone Encounter (Signed)
 Has not missed any doses of Xarelto  prior to cardioversion.   Has supplemental O2 and has been fine on 3 L O2 sats have always been in 93-94% on 3L.    Yesterday sat dipped into 80s on the 3L.  He uses every night as well as CPAP.  Denied anything was wrong and didn't feel SOB.  Typically uses pulse ox to check HR and prior to last night all good readings.    He had labs drawn today at PCP visit, including d-Dimer and CXR.  Currently on the phone his p ox reading is 85.  Comes up to high 80s  low 90s but does not stay.    He was on the [employer] CPAP for years prior to it being recalled for the foam breaking down/death.  Reviewed  ER precautions w her which she is in agreement w.     Aware I will send to Dr. Paulita Boss for review and recommendations.

## 2023-08-02 NOTE — Assessment & Plan Note (Signed)
Will follow up cbc 

## 2023-08-03 ENCOUNTER — Other Ambulatory Visit: Payer: Self-pay

## 2023-08-03 ENCOUNTER — Ambulatory Visit: Payer: Self-pay | Admitting: Nurse Practitioner

## 2023-08-03 ENCOUNTER — Telehealth: Payer: Self-pay

## 2023-08-03 ENCOUNTER — Emergency Department (HOSPITAL_COMMUNITY)

## 2023-08-03 ENCOUNTER — Encounter (HOSPITAL_COMMUNITY): Payer: Self-pay

## 2023-08-03 ENCOUNTER — Inpatient Hospital Stay (HOSPITAL_COMMUNITY)
Admission: EM | Admit: 2023-08-03 | Discharge: 2023-08-07 | DRG: 291 | Disposition: A | Discharge status: Home Health | Source: Ambulatory Visit | Attending: Internal Medicine | Admitting: Internal Medicine

## 2023-08-03 DIAGNOSIS — B961 Klebsiella pneumoniae [K. pneumoniae] as the cause of diseases classified elsewhere: Secondary | ICD-10-CM | POA: Diagnosis not present

## 2023-08-03 DIAGNOSIS — Z7901 Long term (current) use of anticoagulants: Secondary | ICD-10-CM

## 2023-08-03 DIAGNOSIS — D649 Anemia, unspecified: Secondary | ICD-10-CM | POA: Diagnosis not present

## 2023-08-03 DIAGNOSIS — I130 Hypertensive heart and chronic kidney disease with heart failure and stage 1 through stage 4 chronic kidney disease, or unspecified chronic kidney disease: Secondary | ICD-10-CM | POA: Diagnosis not present

## 2023-08-03 DIAGNOSIS — Z9981 Dependence on supplemental oxygen: Secondary | ICD-10-CM | POA: Diagnosis not present

## 2023-08-03 DIAGNOSIS — Y846 Urinary catheterization as the cause of abnormal reaction of the patient, or of later complication, without mention of misadventure at the time of the procedure: Secondary | ICD-10-CM | POA: Diagnosis present

## 2023-08-03 DIAGNOSIS — E1122 Type 2 diabetes mellitus with diabetic chronic kidney disease: Secondary | ICD-10-CM | POA: Diagnosis not present

## 2023-08-03 DIAGNOSIS — G4733 Obstructive sleep apnea (adult) (pediatric): Secondary | ICD-10-CM | POA: Diagnosis present

## 2023-08-03 DIAGNOSIS — I4819 Other persistent atrial fibrillation: Secondary | ICD-10-CM | POA: Diagnosis not present

## 2023-08-03 DIAGNOSIS — Z801 Family history of malignant neoplasm of trachea, bronchus and lung: Secondary | ICD-10-CM

## 2023-08-03 DIAGNOSIS — Z466 Encounter for fitting and adjustment of urinary device: Secondary | ICD-10-CM | POA: Diagnosis not present

## 2023-08-03 DIAGNOSIS — I5043 Acute on chronic combined systolic (congestive) and diastolic (congestive) heart failure: Secondary | ICD-10-CM | POA: Diagnosis not present

## 2023-08-03 DIAGNOSIS — Z8042 Family history of malignant neoplasm of prostate: Secondary | ICD-10-CM

## 2023-08-03 DIAGNOSIS — Z9359 Other cystostomy status: Secondary | ICD-10-CM | POA: Diagnosis not present

## 2023-08-03 DIAGNOSIS — N390 Urinary tract infection, site not specified: Secondary | ICD-10-CM | POA: Diagnosis present

## 2023-08-03 DIAGNOSIS — Z888 Allergy status to other drugs, medicaments and biological substances status: Secondary | ICD-10-CM

## 2023-08-03 DIAGNOSIS — Z6841 Body Mass Index (BMI) 40.0 and over, adult: Secondary | ICD-10-CM | POA: Diagnosis not present

## 2023-08-03 DIAGNOSIS — Z79899 Other long term (current) drug therapy: Secondary | ICD-10-CM | POA: Diagnosis not present

## 2023-08-03 DIAGNOSIS — Z5941 Food insecurity: Secondary | ICD-10-CM

## 2023-08-03 DIAGNOSIS — R0602 Shortness of breath: Secondary | ICD-10-CM | POA: Diagnosis not present

## 2023-08-03 DIAGNOSIS — I110 Hypertensive heart disease with heart failure: Secondary | ICD-10-CM | POA: Diagnosis not present

## 2023-08-03 DIAGNOSIS — R339 Retention of urine, unspecified: Secondary | ICD-10-CM | POA: Diagnosis present

## 2023-08-03 DIAGNOSIS — I480 Paroxysmal atrial fibrillation: Secondary | ICD-10-CM | POA: Diagnosis not present

## 2023-08-03 DIAGNOSIS — Z7985 Long-term (current) use of injectable non-insulin antidiabetic drugs: Secondary | ICD-10-CM

## 2023-08-03 DIAGNOSIS — E1140 Type 2 diabetes mellitus with diabetic neuropathy, unspecified: Secondary | ICD-10-CM | POA: Diagnosis present

## 2023-08-03 DIAGNOSIS — Z7984 Long term (current) use of oral hypoglycemic drugs: Secondary | ICD-10-CM

## 2023-08-03 DIAGNOSIS — Z7982 Long term (current) use of aspirin: Secondary | ICD-10-CM

## 2023-08-03 DIAGNOSIS — G4730 Sleep apnea, unspecified: Secondary | ICD-10-CM | POA: Diagnosis present

## 2023-08-03 DIAGNOSIS — I509 Heart failure, unspecified: Secondary | ICD-10-CM | POA: Diagnosis not present

## 2023-08-03 DIAGNOSIS — R0902 Hypoxemia: Secondary | ICD-10-CM | POA: Diagnosis not present

## 2023-08-03 DIAGNOSIS — N1832 Chronic kidney disease, stage 3b: Secondary | ICD-10-CM | POA: Diagnosis present

## 2023-08-03 DIAGNOSIS — Z9842 Cataract extraction status, left eye: Secondary | ICD-10-CM

## 2023-08-03 DIAGNOSIS — D631 Anemia in chronic kidney disease: Secondary | ICD-10-CM | POA: Diagnosis not present

## 2023-08-03 DIAGNOSIS — I5021 Acute systolic (congestive) heart failure: Secondary | ICD-10-CM | POA: Diagnosis not present

## 2023-08-03 DIAGNOSIS — Z8546 Personal history of malignant neoplasm of prostate: Secondary | ICD-10-CM

## 2023-08-03 DIAGNOSIS — Z96651 Presence of right artificial knee joint: Secondary | ICD-10-CM | POA: Diagnosis present

## 2023-08-03 DIAGNOSIS — I5020 Unspecified systolic (congestive) heart failure: Secondary | ICD-10-CM | POA: Diagnosis present

## 2023-08-03 DIAGNOSIS — R0989 Other specified symptoms and signs involving the circulatory and respiratory systems: Secondary | ICD-10-CM | POA: Diagnosis not present

## 2023-08-03 DIAGNOSIS — Z86718 Personal history of other venous thrombosis and embolism: Secondary | ICD-10-CM | POA: Diagnosis not present

## 2023-08-03 DIAGNOSIS — I2510 Atherosclerotic heart disease of native coronary artery without angina pectoris: Secondary | ICD-10-CM | POA: Diagnosis present

## 2023-08-03 DIAGNOSIS — Z86711 Personal history of pulmonary embolism: Secondary | ICD-10-CM

## 2023-08-03 DIAGNOSIS — I272 Pulmonary hypertension, unspecified: Secondary | ICD-10-CM | POA: Diagnosis present

## 2023-08-03 DIAGNOSIS — R319 Hematuria, unspecified: Secondary | ICD-10-CM | POA: Diagnosis not present

## 2023-08-03 DIAGNOSIS — I10 Essential (primary) hypertension: Secondary | ICD-10-CM | POA: Diagnosis present

## 2023-08-03 DIAGNOSIS — Z803 Family history of malignant neoplasm of breast: Secondary | ICD-10-CM

## 2023-08-03 DIAGNOSIS — J9621 Acute and chronic respiratory failure with hypoxia: Secondary | ICD-10-CM | POA: Diagnosis present

## 2023-08-03 DIAGNOSIS — J9811 Atelectasis: Secondary | ICD-10-CM | POA: Diagnosis not present

## 2023-08-03 DIAGNOSIS — E785 Hyperlipidemia, unspecified: Secondary | ICD-10-CM | POA: Diagnosis present

## 2023-08-03 DIAGNOSIS — Z806 Family history of leukemia: Secondary | ICD-10-CM

## 2023-08-03 DIAGNOSIS — T83518A Infection and inflammatory reaction due to other urinary catheter, initial encounter: Secondary | ICD-10-CM | POA: Diagnosis not present

## 2023-08-03 LAB — CBC WITH DIFFERENTIAL/PLATELET
Abs Immature Granulocytes: 0.02 10*3/uL (ref 0.00–0.07)
Absolute Lymphocytes: 719 {cells}/uL — ABNORMAL LOW (ref 850–3900)
Absolute Monocytes: 455 {cells}/uL (ref 200–950)
Basophils Absolute: 55 {cells}/uL (ref 0–200)
Basophils Absolute: 0.1 10*3/uL (ref 0.0–0.1)
Basophils Relative: 0.6 %
Basophils Relative: 1 %
Eosinophils Absolute: 191 {cells}/uL (ref 15–500)
Eosinophils Absolute: 0.1 10*3/uL (ref 0.0–0.5)
Eosinophils Relative: 2.1 %
Eosinophils Relative: 2 %
HCT: 39.6 % (ref 38.5–50.0)
HCT: 39.4 % (ref 39.0–52.0)
Hemoglobin: 12.3 g/dL — ABNORMAL LOW (ref 13.2–17.1)
Hemoglobin: 12.3 g/dL — ABNORMAL LOW (ref 13.0–17.0)
Immature Granulocytes: 0 %
Lymphocytes Relative: 9 %
Lymphs Abs: 0.8 10*3/uL (ref 0.7–4.0)
MCH: 28.6 pg (ref 27.0–33.0)
MCH: 28.7 pg (ref 26.0–34.0)
MCHC: 31.1 g/dL — ABNORMAL LOW (ref 32.0–36.0)
MCHC: 31.2 g/dL (ref 30.0–36.0)
MCV: 92.1 fL (ref 80.0–100.0)
MCV: 92.1 fL (ref 80.0–100.0)
MPV: 10.2 fL (ref 7.5–12.5)
Monocytes Absolute: 0.5 10*3/uL (ref 0.1–1.0)
Monocytes Relative: 5 %
Monocytes Relative: 5 %
Neutro Abs: 7680 {cells}/uL (ref 1500–7800)
Neutro Abs: 7.3 10*3/uL (ref 1.7–7.7)
Neutrophils Relative %: 84.4 %
Neutrophils Relative %: 83 %
Platelets: 283 10*3/uL (ref 140–400)
Platelets: 257 10*3/uL (ref 150–400)
RBC: 4.3 10*6/uL (ref 4.20–5.80)
RBC: 4.28 MIL/uL (ref 4.22–5.81)
RDW: 13.7 % (ref 11.0–15.0)
RDW: 15.1 % (ref 11.5–15.5)
Total Lymphocyte: 7.9 %
WBC: 9.1 10*3/uL (ref 3.8–10.8)
WBC: 8.8 10*3/uL (ref 4.0–10.5)
nRBC: 0 % (ref 0.0–0.2)

## 2023-08-03 LAB — COMPLETE METABOLIC PANEL WITHOUT GFR
AG Ratio: 1.3 (calc) (ref 1.0–2.5)
ALT: 34 U/L (ref 9–46)
AST: 15 U/L (ref 10–35)
Albumin: 3.9 g/dL (ref 3.6–5.1)
Alkaline phosphatase (APISO): 74 U/L (ref 35–144)
BUN/Creatinine Ratio: 17 (calc) (ref 6–22)
BUN: 26 mg/dL — ABNORMAL HIGH (ref 7–25)
CO2: 24 mmol/L (ref 20–32)
Calcium: 9.3 mg/dL (ref 8.6–10.3)
Chloride: 109 mmol/L (ref 98–110)
Creat: 1.49 mg/dL — ABNORMAL HIGH (ref 0.70–1.28)
Globulin: 2.9 g/dL (ref 1.9–3.7)
Glucose, Bld: 113 mg/dL — ABNORMAL HIGH (ref 65–99)
Potassium: 4.4 mmol/L (ref 3.5–5.3)
Sodium: 144 mmol/L (ref 135–146)
Total Bilirubin: 1 mg/dL (ref 0.2–1.2)
Total Protein: 6.8 g/dL (ref 6.1–8.1)

## 2023-08-03 LAB — COMPREHENSIVE METABOLIC PANEL WITH GFR
ALT: 38 U/L (ref 0–44)
AST: 25 U/L (ref 15–41)
Albumin: 3.7 g/dL (ref 3.5–5.0)
Alkaline Phosphatase: 56 U/L (ref 38–126)
Anion gap: 11 (ref 5–15)
BUN: 25 mg/dL — ABNORMAL HIGH (ref 8–23)
CO2: 24 mmol/L (ref 22–32)
Calcium: 9.1 mg/dL (ref 8.9–10.3)
Chloride: 109 mmol/L (ref 98–111)
Creatinine, Ser: 1.6 mg/dL — ABNORMAL HIGH (ref 0.61–1.24)
GFR, Estimated: 45 mL/min — ABNORMAL LOW (ref >60)
Glucose, Bld: 102 mg/dL — ABNORMAL HIGH (ref 70–99)
Potassium: 4.2 mmol/L (ref 3.5–5.1)
Sodium: 144 mmol/L (ref 135–145)
Total Bilirubin: 1.7 mg/dL — ABNORMAL HIGH (ref 0.0–1.2)
Total Protein: 7.3 g/dL (ref 6.5–8.1)

## 2023-08-03 LAB — RESP PANEL BY RT-PCR (RSV, FLU A&B, COVID)  RVPGX2
Influenza A by PCR: NEGATIVE
Influenza B by PCR: NEGATIVE
Resp Syncytial Virus by PCR: NEGATIVE
SARS Coronavirus 2 by RT PCR: NEGATIVE

## 2023-08-03 LAB — URINALYSIS, W/ REFLEX TO CULTURE (INFECTION SUSPECTED)
Bilirubin Urine: NEGATIVE
Glucose, UA: >=500 mg/dL — AB
Ketones, ur: NEGATIVE mg/dL
Nitrite: NEGATIVE
Protein, ur: 100 mg/dL — AB
Specific Gravity, Urine: 1.01 (ref 1.005–1.030)
WBC, UA: >50 WBC/hpf (ref 0–5)
pH: 6 (ref 5.0–8.0)

## 2023-08-03 LAB — TROPONIN I (HIGH SENSITIVITY)
Troponin I (High Sensitivity): 21 ng/L — ABNORMAL HIGH (ref <18)
Troponin I (High Sensitivity): 17 ng/L (ref <18)

## 2023-08-03 LAB — D-DIMER, QUANTITATIVE: D-Dimer, Quant: 0.46 ug{FEU}/mL (ref <0.50)

## 2023-08-03 LAB — MICROALBUMIN / CREATININE URINE RATIO
Creatinine, Urine: 74 mg/dL (ref 20–320)
Microalb Creat Ratio: 1584 mg/g{creat} — ABNORMAL HIGH (ref <30)
Microalb, Ur: 117.2 mg/dL

## 2023-08-03 LAB — BRAIN NATRIURETIC PEPTIDE
B Natriuretic Peptide: 994.8 pg/mL — ABNORMAL HIGH (ref 0.0–100.0)
Brain Natriuretic Peptide: 729 pg/mL — ABNORMAL HIGH (ref <100)

## 2023-08-03 MED ORDER — SODIUM CHLORIDE 0.9% FLUSH
3.0000 mL | Freq: Two times a day (BID) | INTRAVENOUS | Status: DC
  Administered 2023-08-03 – 2023-08-04 (×2): 3 mL via INTRAVENOUS

## 2023-08-03 MED ORDER — HYDRALAZINE HCL 50 MG PO TABS
75.0000 mg | ORAL_TABLET | Freq: Three times a day (TID) | ORAL | Status: DC
  Administered 2023-08-03 – 2023-08-07 (×11): 75 mg via ORAL
  Filled 2023-08-03 (×8): qty 1
  Filled 2023-08-03: qty 3
  Filled 2023-08-03 (×2): qty 1

## 2023-08-03 MED ORDER — HYDRALAZINE HCL 20 MG/ML IJ SOLN: 5.0000 mg | Freq: Once | INTRAMUSCULAR | Status: DC

## 2023-08-03 MED ORDER — SODIUM CHLORIDE 0.9 % IV SOLN: 250.0000 mL | INTRAVENOUS | Status: DC | PRN

## 2023-08-03 MED ORDER — HYDRALAZINE HCL 20 MG/ML IJ SOLN
10.0000 mg | Freq: Once | INTRAMUSCULAR | Status: AC
  Administered 2023-08-03: 10 mg via INTRAVENOUS
  Filled 2023-08-03: qty 1

## 2023-08-03 MED ORDER — RIVAROXABAN 20 MG PO TABS
20.0000 mg | ORAL_TABLET | Freq: Every day | ORAL | Status: DC
  Administered 2023-08-03 – 2023-08-04 (×2): 20 mg via ORAL
  Filled 2023-08-03: qty 1
  Filled 2023-08-03: qty 2

## 2023-08-03 MED ORDER — SENNA 8.6 MG PO TABS: 1.0000 | ORAL_TABLET | Freq: Every day | ORAL | Status: DC | PRN

## 2023-08-03 MED ORDER — ONDANSETRON HCL 4 MG/2ML IJ SOLN
4.0000 mg | Freq: Four times a day (QID) | INTRAMUSCULAR | Status: DC | PRN
Start: 2023-08-03 — End: 2023-08-07

## 2023-08-03 MED ORDER — NITROGLYCERIN 2 % TD OINT
1.0000 [in_us] | TOPICAL_OINTMENT | Freq: Once | TRANSDERMAL | Status: AC
  Administered 2023-08-04: 1 [in_us] via TOPICAL
  Filled 2023-08-03: qty 1

## 2023-08-03 MED ORDER — ACETAMINOPHEN 325 MG PO TABS: 650.0000 mg | ORAL_TABLET | ORAL | Status: DC | PRN

## 2023-08-03 MED ORDER — FUROSEMIDE 10 MG/ML IJ SOLN
40.0000 mg | Freq: Once | INTRAMUSCULAR | Status: AC
  Administered 2023-08-03: 40 mg via INTRAVENOUS
  Filled 2023-08-03: qty 4

## 2023-08-03 MED ORDER — SODIUM CHLORIDE 0.9% FLUSH: 3.0000 mL | INTRAVENOUS | Status: DC | PRN

## 2023-08-03 MED ORDER — LOSARTAN POTASSIUM 50 MG PO TABS
50.0000 mg | ORAL_TABLET | Freq: Every morning | ORAL | Status: DC
  Administered 2023-08-04: 50 mg via ORAL
  Filled 2023-08-03: qty 1

## 2023-08-03 MED ORDER — METOPROLOL TARTRATE 100 MG PO TABS
100.0000 mg | ORAL_TABLET | Freq: Two times a day (BID) | ORAL | Status: DC
  Administered 2023-08-03 – 2023-08-07 (×8): 100 mg via ORAL
  Filled 2023-08-03 (×6): qty 1
  Filled 2023-08-03: qty 4
  Filled 2023-08-03: qty 1

## 2023-08-03 MED ORDER — SENNA 8.7 MG PO CHEW: 1.0000 | CHEWABLE_TABLET | ORAL | Status: DC | PRN

## 2023-08-03 MED ORDER — HYDRALAZINE HCL 25 MG PO TABS
25.0000 mg | ORAL_TABLET | Freq: Once | ORAL | Status: AC
  Administered 2023-08-03: 25 mg via ORAL
  Filled 2023-08-03: qty 1

## 2023-08-03 MED ORDER — FUROSEMIDE 10 MG/ML IJ SOLN
40.0000 mg | Freq: Two times a day (BID) | INTRAMUSCULAR | Status: DC
  Administered 2023-08-04 – 2023-08-06 (×5): 40 mg via INTRAVENOUS
  Filled 2023-08-03 (×5): qty 4

## 2023-08-03 NOTE — ED Notes (Signed)
 Pts bp still elevated at 178/94, paged MD [facility] will place orders for additional medication.

## 2023-08-03 NOTE — Transitions of Care (Post Inpatient/ED Visit) (Signed)
 Patient ID: Thomas [name], male   DOB: October 15, 1950, 73 y.o.   MRN: [id number] 08/03/2023 [facility] RN was about to call to follow up with patient when note was read from urology visit which states patient O2 sat at 82% on 4liters and patient was instructed to call to ED - call to wife who verified patient dob and confirmed patient is in ED after urology visit but states she just parked her car and is going in now. TOC RN advised wife that ED visit will be monitored for possible admission and advised wife to call with questions/concerns. Tonia Frankel RN, CCM [place]  VBCI-Population Health RN Care Manager [phone]

## 2023-08-03 NOTE — H&P (Addendum)
 History and Physical    Patient: Thomas [name] ZOX:[id number] DOB: 11-29-1950 DOA: 08/03/2023 DOS: the patient was seen and examined on 08/03/2023 PCP: Verma Gobble, NP  Patient coming from: urology via EMS  Chief Complaint:  Chief Complaint  Patient presents with   Shortness of Breath   HPI: Thomas [name] is a 73 y.o. male with medical history significant of history of chronic respiratory failure, DVT/PE on xarelto , HFmrEF, paroxysmal atrial fibrillation, T2DM, HTN, OSA, hx of prostate cancer, urinary retention with chronic suprapubic catheter, CAD,  and pulmonary HTN presents with shortness of breath and recent weight gain. He is accompanied by his daughter and son.  He has been experiencing worsening shortness of breath since undergoing a cardioversion procedure for atrial fibrillation on the 10th of this month. Initially, there was some improvement, but symptoms have since deteriorated. He struggles to walk short distances, such as ten feet, without needing to rest multiple times. No chest pain, fevers, or chills.  He normally uses oxygen  as needed but has recently required it continuously. His oxygen  levels have been dropping even while on oxygen , leading to an increase in his oxygen  flow rate from two to four liters per minute during activity.  He has gained approximately 14 pounds over the last three weeks despite a decreased appetite. His medication regimen includes spironolactone , Lasix , and Fosamax. The Lasix  dose was reduced prior to a procedure and not adjusted back.  He said the urology office and had his suprapubic catheter exchange when he was noted to be hypoxic in the 80s despite being on his 4 L of oxygen .  He was transferred to the ED for further evaluation.  Upon admission into the emergency department patient was noted to be afebrile with respirations 16-29, blood pressures elevated up to 183/99, O2 saturations noted to be as low as 88% with improvement  on 6 L nasal cannula oxygen .  Labs significant for BNP 994.8, high-sensitivity troponin 21, BUN 25, and creatinine 1.6.  Chest x-ray noted similar appearance including cardiomegaly with pulmonary vascular congestion without overt edema.  Patient was given Lasix  40 mg IV.  Review of Systems: As mentioned in the history of present illness. All other systems reviewed and are negative. Past Medical History:  Diagnosis Date   Acute on chronic diastolic CHF (congestive heart failure) (HCC) 06/01/2017   Arthritis    Cancer (HCC) 2010   Prostate   Chronic kidney disease    Diabetes mellitus    Diabetic neuropathy (HCC)    feet   DVT (deep venous thrombosis) (HCC)    Genetic testing 06/22/2016   Mr. Suto underwent genetic counseling and testing for hereditary cancer syndromes on 05/14/2016. His results were negative for mutations in all 46 genes analyzed by [employer]'s 46-gene Common Hereditary Cancers Panel. Genes analyzed include: APC, ATM, AXIN2, BARD1, BMPR1A, BRCA1, BRCA2, BRIP1, CDH1, CDKN2A, CHEK2, CTNNA1, DICER1, EPCAM, GREM1, HOXB13, KIT, MEN1, MLH1, MSH2, MSH3, MSH6, MUTYH, NB   GERD (gastroesophageal reflux disease)    Hypertension    PE (pulmonary thromboembolism) (HCC)    Pneumonia    Sleep apnea    not wearing CPAP   Suprapubic catheter (HCC) 11/17/2019   Past Surgical History:  Procedure Laterality Date   CARDIOVERSION N/A 06/03/2017   Procedure: CARDIOVERSION;  Surgeon: Hugh Madura, MD;  Location: [facility];  Service: Cardiovascular;  Laterality: N/A;   CARDIOVERSION N/A 07/27/2023   Procedure: CARDIOVERSION;  Surgeon: Loyde Rule, MD;  Location: [facility] INVASIVE CV LAB;  Service: Cardiovascular;  Laterality: N/A;   CATARACT EXTRACTION Left 07/31/2021   Dr.Glenn, [facility]   COLONOSCOPY     COLONOSCOPY WITH PROPOFOL  N/A 10/20/2018   Procedure: COLONOSCOPY WITH PROPOFOL ;  Surgeon: Lindle Rhea, MD;  Location: [facility] ENDOSCOPY;  Service: Gastroenterology;   Laterality: N/A;   HERNIA REPAIR     KNEE SURGERY     MULTIPLE TOOTH EXTRACTIONS     POLYPECTOMY  10/20/2018   Procedure: POLYPECTOMY;  Surgeon: Lindle Rhea, MD;  Location: [facility] ENDOSCOPY;  Service: Gastroenterology;;   PROSTATE SURGERY     RADIOACTIVE SEED IMPLANT     RIGHT/LEFT HEART CATH AND CORONARY ANGIOGRAPHY N/A 07/06/2017   Procedure: RIGHT/LEFT HEART CATH AND CORONARY ANGIOGRAPHY;  Surgeon: Lucendia Rusk, MD;  Location: [facility] INVASIVE CV LAB;  Service: Cardiovascular;  Laterality: N/A;   SHOULDER SURGERY     TOTAL KNEE ARTHROPLASTY Right 11/19/2016   Procedure: RIGHT TOTAL KNEE ARTHROPLASTY;  Surgeon: Wes Hamman, MD;  Location: [facility] OR;  Service: Orthopedics;  Laterality: Right;   uretha surgery-2014     Social History:  reports that he has never smoked. He has never been exposed to tobacco smoke. He has never used smokeless tobacco. He reports that he does not drink alcohol  and does not use drugs.  Allergies  Allergen Reactions   Lisinopril  Cough and Other (See Comments)    Muscle pain    Family History  Problem Relation Age of Onset   Breast cancer Mother 60       d.89   Breast cancer Sister 30       d.30   Leukemia Brother 18       d.20   Breast cancer Maternal Aunt 40       d.40s   Lung cancer Maternal Uncle    Prostate cancer Paternal Uncle    Prostate cancer Brother        recurred recently at age 57   Other Brother 15       spinal tumor   Cervical cancer Other 22       d.22   Cancer Sister 27       unspecified type   Cancer Maternal Uncle 80       unspecified type    Prior to Admission medications   Medication Sig Start Date End Date Taking? Authorizing Provider  Accu-Chek Softclix Lancets lancets Use to test blood sugar daily 06/10/22   [name], Jessica K, NP  Alcohol  Swabs (B-D SINGLE USE SWABS REGULAR) PADS Use in testing blood sugar daily. Dx: E11.22 05/20/21   Verma Gobble, NP  aspirin  EC 81 MG tablet Take 81 mg by mouth daily.     [provider]  atorvastatin  (LIPITOR) 40 MG tablet TAKE 1 TABLET EVERY DAY Patient taking differently: Take 40 mg by mouth daily. Patient is taking daily in the evening 12/14/22   Verma Gobble, NP  Blood Glucose Calibration (ACCU-CHEK AVIVA) SOLN Use once daily as directed dx E11.22 08/25/19   [name], Jessica K, NP  Blood Glucose Monitoring Suppl (TRUE METRIX AIR GLUCOSE METER) w/Device KIT 1 Device by Does not apply route daily as needed. E11.22 05/20/21   [name], Jessica K, NP  calcium  carbonate (TUMS - DOSED IN MG ELEMENTAL CALCIUM ) 500 MG chewable tablet Chew 2 tablets by mouth as needed for indigestion or heartburn.    [provider]  Cholecalciferol  (VITAMIN D3) 50 MCG (2000 UT) TABS Take 1 tablet (2,000 Units) by mouth daily. 01/19/22  empagliflozin  (JARDIANCE ) 10 MG TABS tablet Take 1 tablet (10 mg total) by mouth daily. 01/20/23   Verma Gobble, NP  furosemide  (LASIX ) 20 MG tablet TAKE 1 TABLET AS DIRECTED AS NEEDED Patient taking differently: Take 20 mg by mouth in the morning. 12/14/22   Marlyse Single T, PA-C  glucose blood (ACCU-CHEK AVIVA PLUS) test strip Use to test blood sugar daily. 03/24/22   Verma Gobble, NP  hydrALAZINE  (APRESOLINE ) 25 MG tablet TAKE 3 TABLETS THREE TIMES DAILY Patient taking differently: Take 75 mg by mouth 3 (three) times daily. 12/02/22   Verma Gobble, NP  Lancets Misc. (ACCU-CHEK SOFTCLIX LANCET DEV) KIT Use to test blood sugar daily 03/18/21   [name], Jessica K, NP  losartan  (COZAAR ) 50 MG tablet TAKE 1 TABLET EVERY DAY Patient taking differently: Take 50 mg by mouth in the morning. 12/31/22   Verma Gobble, NP  metFORMIN  (GLUCOPHAGE ) 500 MG tablet TAKE 1 TABLET TWICE DAILY WITH MEALS 02/15/23   [name], Jessica K, NP  metoprolol  tartrate (LOPRESSOR ) 100 MG tablet Take 1 tablet (100 mg total) by mouth 2 (two) times daily. 07/20/23 10/18/23  Meng, Hao, PA  Multiple Vitamin (MULTIVITAMIN ADULT PO) Take 2 each by mouth  as needed.    [provider]  potassium chloride  SA (KLOR-CON  M) 20 MEQ tablet TAKE 1 TABLET TWICE DAILY Patient taking differently: Take 20 mEq by mouth in the morning. 01/07/23   Verma Gobble, NP  rivaroxaban  (XARELTO ) 20 MG TABS tablet Take 1 tablet (20 mg total) by mouth daily with supper. 07/06/23   Pokhrel, Laxman, MD  Senna 8.7 MG CHEW Chew 1 tablet by mouth as needed. 05/20/21   Verma Gobble, NP  spironolactone  (ALDACTONE ) 25 MG tablet Take 1 tablet (25 mg total) by mouth daily. 08/02/23   Verma Gobble, NP  tirzepatide  (MOUNJARO ) 15 MG/0.5ML Pen Inject 15 mg into the skin once a week. 07/13/23   Jann Melody, MD  TRUEplus Lancets 28G MISC TEST BLOOD SUGAR EVERY DAY AS NEEDED 01/06/23   Verma Gobble, NP  UNABLE TO FIND 500 mg. Med Name: vitmain C    [provider]  zinc  gluconate 50 MG tablet Take 1 tablet (50 mg total) by mouth daily. 11/18/21   Ngetich, Elijio [place], NP    Physical Exam: Vitals:   08/03/23 1545 08/03/23 1600 08/03/23 1605 08/03/23 1615  BP: (!) 176/95 (!) 183/99 (!) 163/83 (!) 173/87  Pulse: 68 71 72 70  Resp: (!) 25 (!) 21 16 20   Temp:      SpO2: 92% 91% 91% (!) 88%  Weight:      Height:         Constitutional: Elderly male currently in no acute distress. Eyes: PERRL, lids and conjunctivae normal ENMT: Mucous membranes are moist.  .Normal dentition.  Neck: normal, supple, JVD present. Respiratory: Decreased overall aeration without significant wheezes or rhonchi appreciated. Cardiovascular: Regular rate and rhythm, no murmurs / rubs / gallops.  At least +1 pitting lower extremity edema present.  2+ pedal pulses. No carotid bruits.  Abdomen: Protuberant abdomen with suprapubic catheter in place.  Bowel sounds positive.  Musculoskeletal: no clubbing / cyanosis. No joint deformity upper and lower extremities. Good ROM, no contractures. Normal muscle tone.  Skin: no rashes, lesions, ulcers. No induration Neurologic:  CN 2-12 grossly intact.   Strength 5/5 in all 4.  Psychiatric: Normal judgment and insight. Alert and oriented x 3. Normal mood.   Data  Reviewed:  EKG reveals sinus rhythm with QTc prolonged at 509.  Reviewed labs, imaging, and pertinent records as documented.  Assessment and Plan: Acute on chronic respiratory failure secondary to heart failure with reduced ejection fraction Patient presents with complaints of increasing shortness of breath and weight gain of 14 pounds over the last 3 weeks.  O2 saturations noted to be in the 80s on his home 4 L. On physical exam patient with at least 1+ pitting lower extremity edema.  BNP elevated at 994.8 which is higher than previous.  Last echocardiogram revealed EF to be 40 to 45% with indeterminate diastolic function.  Suspect symptoms secondary to changes in diuretic prior to having cardioversion on the 10th.  Patient has been given Lasix  40 mg IV x 1 dose. - Admit to a telemetry bed - Continuous pulse oximetry with oxygen  to maintain O2 saturation greater than 90% - Strict I&Os and daily weights - Lasix  40 mg IV twice daily - Continue metoprolol , hydralazine , and losartan  - Physical therapy to eval and treat  Essential hypertension Blood pressures elevated up to 183/99.   - Continue hydralazine , metoprolol , losartan   History of DVT/pulmonary embolism on chronic anticoagulation - Continue Xarelto   Paroxysmal atrial fibrillation on chronic anticoagulation Patient underwent cardioversion on 6/10.  Currently appears to be in a sinus rhythm. - Continue metoprolol  and Xarelto   Diabetes mellitus type 2, without long-term use of insulin  Last available hemoglobin A1c was 5.5 when checked on 05/24/2023. - Hypoglycemia protocols - Held metformin , Jardiance , and Mounjaro   Normocytic anemia Hemoglobin noted to be 12.3. - Continue to monitor  Chronic kidney disease stage IIIb Creatinine noted to be 1.6.  Baseline creatinine appears to range from  1.5-1.7. - Continue to monitor kidney function with diuresis  Urinary retention s/p suprapubic catheter Patient had suprapubic catheter replaced by urology prior to arrival.  Morbid obesity BMI 47.21 kg/m. - Continue Mounjaro  in outpatient setting  OSA - Continue CPAP at night  DVT prophylaxis: Xarelto  Advance Care Planning:   Code Status: Full Code   Consults: None  Family Communication: Patient's daughter and son updated at bedside.  Severity of Illness: The appropriate patient status for this patient is INPATIENT. Inpatient status is judged to be reasonable and necessary in order to provide the required intensity of service to ensure the patient's safety. The patient's presenting symptoms, physical exam findings, and initial radiographic and laboratory data in the context of their chronic comorbidities is felt to place them at high risk for further clinical deterioration. Furthermore, it is not anticipated that the patient will be medically stable for discharge from the hospital within 2 midnights of admission.   * I certify that at the point of admission it is my clinical judgment that the patient will require inpatient hospital care spanning beyond 2 midnights from the point of admission due to high intensity of service, high risk for further deterioration and high frequency of surveillance required.*  Author: Lena Qualia, MD 08/03/2023 4:32 PM  For on call review [email or web address].

## 2023-08-03 NOTE — Telephone Encounter (Signed)
 Called and spoke with patient's wife Felipa Horsfall, shared Dr. Daril Edge recommendation.  Offered sooner appt with Dr. Renna Cary on 6/19 at 2:00 PM. Felipa Horsfall accepted and confirmed patient can make this appt.  Advised on ED precautions, Felipa Horsfall verbalized understanding.

## 2023-08-03 NOTE — ED Notes (Signed)
 CCMD called.

## 2023-08-03 NOTE — ED Provider Triage Note (Signed)
 Emergency Medicine Provider Triage Evaluation Note  KIMMY PARISH , a 73 y.o. male  was evaluated in triage.  Pt complains of SOB.  Review of Systems  Positive: SOB, peripheral edema Negative: Fever, pain, vomiting  Physical Exam  BP (!) 139/97   Pulse 73   Temp 98.1 F (36.7 C)   Resp (!) 24   Ht 5' 9 (1.753 m)   Wt (!) 145 kg   SpO2 94%   BMI 47.21 kg/m  Gen:   Awake, no distress   Resp:  Normal effort  MSK:   Moves extremities without difficulty  Other:  LE edema  Medical Decision Making  Medically screening exam initiated at 12:34 PM.  Appropriate orders placed.  TRAYDEN BRANDY was informed that the remainder of the evaluation will be completed by another provider, this initial triage assessment does not replace that evaluation, and the importance of remaining in the ED until their evaluation is complete.  Patient with pulmonary history (he does not know diagnosis), on home O2 with increasing need over weeks. Increased leg swelling. On Lasix  without change in dosing. No fever. Eating and drinking as usual. No vomiting. Has suprapubic catheter exchanged monthly, last change this morning. Saw PCP yesterday, routine.    Mandy Second, PA-C 08/03/23 1237

## 2023-08-03 NOTE — ED Provider Notes (Signed)
  Physical Exam  BP (!) 176/95   Pulse 68   Temp 98.1 F (36.7 C)   Resp (!) 25   Ht 5' 9 (1.753 m)   Wt (!) 145 kg   SpO2 92%   BMI 47.21 kg/m   Physical Exam  Procedures  Procedures  ED Course / MDM    Medical Decision Making Risk Prescription drug management.    Patient has history of hypertension, hyperlipidemia, diabetes, A-fib and heart failure. Patient was sent to the emergency room because of low oxygen  saturations.  Per Dr. Dolan Freiberg, lab workup is pending at this time.  X-ray of the chest independently interpreted, evidence of pulmonary congestion noted.  Dr. Dolan Freiberg indicated that patient will need admission to the hospital given new oxygen  requirement and with evidence of volume overload.     Deatra Face, MD 08/03/23 [id number]

## 2023-08-03 NOTE — ED Triage Notes (Signed)
 Pt was at clinic to get suprapubic catheter changed and oxygen  level was in 80s and was sent to ED. Pt states he has been having increasing shortness of breath and oxygen  use at home. Pt states he has been wearing 4LNC. 02 =88% on 4L on arrival, oxygen  increased to [facility] in triage, 02 sat = 94% in triage. Pt states his breathing feels better as well.

## 2023-08-03 NOTE — ED Provider Notes (Signed)
 Emergency Department Provider Note   I have reviewed the triage vital signs and the nursing notes.   HISTORY  Chief Complaint Shortness of Breath   HPI Thomas [name] is a 73 y.o. male past history of congestive heart failure, diabetes, hypertension, prior PE on Xarelto  presents to the emergency department with shortness of breath and worsening O2 requirement.  Patient has noticed some increased swelling in the legs and weight gain at home.  Typically is on 3 L nasal cannula O2 at home.  He has had some increased shortness of breath especially with activity.  He was found to be hypoxemic on 3 L and was advised to change to 4 L with activity which he did.  He was at a urology appointment today for a suprapubic catheter exchange and on 4 L and was found to have an O2 saturation of 82%.  He was feeling short of breath and fatigued along with some lightheadedness.  No chest pain.  Given this, was advised to present to the emergency department after completing the procedure.    Past Medical History:  Diagnosis Date   Acute on chronic diastolic CHF (congestive heart failure) (HCC) 06/01/2017   Arthritis    Cancer (HCC) 2010   Prostate   Chronic kidney disease    Diabetes mellitus    Diabetic neuropathy (HCC)    feet   DVT (deep venous thrombosis) (HCC)    Genetic testing 06/22/2016   [name] underwent genetic counseling and testing for hereditary cancer syndromes on 05/14/2016. His results were negative for mutations in all 46 genes analyzed by [facility]'s 46-gene Common Hereditary Cancers Panel. Genes analyzed include: APC, ATM, AXIN2, BARD1, BMPR1A, BRCA1, BRCA2, BRIP1, CDH1, CDKN2A, CHEK2, CTNNA1, DICER1, EPCAM, GREM1, HOXB13, KIT, MEN1, MLH1, MSH2, MSH3, MSH6, MUTYH, NB   GERD (gastroesophageal reflux disease)    Hypertension    PE (pulmonary thromboembolism) (HCC)    Pneumonia    Sleep apnea    not wearing CPAP   Suprapubic catheter (HCC) 11/17/2019    Review of  Systems  Constitutional: No fever/chills. Positive fatigue.  Cardiovascular: Denies chest pain. Respiratory: Positive shortness of breath. Gastrointestinal: No abdominal pain.  No nausea, no vomiting.  Skin: Negative for rash. Neurological: Negative for headaches.  ____________________________________________   PHYSICAL EXAM:  VITAL SIGNS: ED Triage Vitals  Encounter Vitals Group     BP 08/03/23 1103 (!) 139/97     Pulse Rate 08/03/23 1103 73     Resp 08/03/23 1103 (!) 24     Temp 08/03/23 1103 98.1 F (36.7 C)     Temp src --      SpO2 08/03/23 1103 90 %     Weight 08/03/23 1117 (!) 319 lb 10.7 oz (145 kg)     Height 08/03/23 1117 5' 9 (1.753 m)   Constitutional: Alert and oriented. Well appearing and in no acute distress. Eyes: Conjunctivae are normal.  Head: Atraumatic. Nose: No congestion/rhinnorhea. Mouth/Throat: Mucous membranes are moist.   Neck: No stridor.   Cardiovascular: Normal rate, regular rhythm. Good peripheral circulation. Grossly normal heart sounds.   Respiratory: Normal respiratory effort.  No retractions. Lungs CTAB. Gastrointestinal: Soft and nontender. No distention.  Musculoskeletal: No lower extremity tenderness with pitting edema in the bilateral feet. No gross deformities of extremities. Neurologic:  Normal speech and language.  Skin:  Skin is warm, dry and intact. No rash noted.  ____________________________________________   LABS (all labs ordered are listed, but only abnormal results are displayed)  Labs Reviewed  CBC WITH DIFFERENTIAL/PLATELET - Abnormal; Notable for the following components:      Result Value   Hemoglobin 12.3 (*)    All other components within normal limits  BRAIN NATRIURETIC PEPTIDE - Abnormal; Notable for the following components:   B Natriuretic Peptide 994.8 (*)    All other components within normal limits  COMPREHENSIVE METABOLIC PANEL WITH GFR - Abnormal; Notable for the following components:   Glucose, Bld  102 (*)    BUN 25 (*)    Creatinine, Ser 1.60 (*)    Total Bilirubin 1.7 (*)    GFR, Estimated 45 (*)    All other components within normal limits  URINALYSIS, W/ REFLEX TO CULTURE (INFECTION SUSPECTED) - Abnormal; Notable for the following components:   APPearance HAZY (*)    Glucose, UA >=500 (*)    Hgb urine dipstick MODERATE (*)    Protein, ur 100 (*)    Leukocytes,Ua LARGE (*)    Bacteria, UA MANY (*)    All other components within normal limits  CBC - Abnormal; Notable for the following components:   RBC 4.21 (*)    Hemoglobin 12.1 (*)    HCT 38.6 (*)    All other components within normal limits  TROPONIN I (HIGH SENSITIVITY) - Abnormal; Notable for the following components:   Troponin I (High Sensitivity) 21 (*)    All other components within normal limits  RESP PANEL BY RT-PCR (RSV, FLU A&B, COVID)  [id number]  URINE CULTURE  BASIC METABOLIC PANEL WITH GFR  TROPONIN I (HIGH SENSITIVITY)   ____________________________________________  EKG  Rate: 70 PR: 169 QTc: 509  Sinus rhythm. Narrow QRS. No STEMI.  ____________________________________________  RADIOLOGY  DG Chest 2 View Result Date: 08/03/2023 CLINICAL DATA:  Short of breath, hypoxia EXAM: CHEST - 2 VIEW COMPARISON:  Prior chest x-ray 08/02/2023 FINDINGS: Cardiomegaly and pulmonary vascular congestion without overt edema. Chronic elevation of the left hemidiaphragm with associated left lower lobe atelectasis. IMPRESSION: Similar appearance of the chest including cardiomegaly and pulmonary vascular congestion without overt edema. Chronic elevation of the left hemidiaphragm with chronic atelectasis. Electronically Signed   By: Fernando Hoyer M.D.   On: 08/03/2023 14:58    ____________________________________________   PROCEDURES  Procedure(s) performed:   Procedures  CRITICAL CARE Performed by: Roberts Ching Total critical care time: 35 minutes Critical care time was exclusive of separately billable  procedures and treating other patients. Critical care was necessary to treat or prevent imminent or life-threatening deterioration. Critical care was time spent personally by me on the following activities: development of treatment plan with patient and/or surrogate as well as nursing, discussions with consultants, evaluation of patient's response to treatment, examination of patient, obtaining history from patient or surrogate, ordering and performing treatments and interventions, ordering and review of laboratory studies, ordering and review of radiographic studies, pulse oximetry and re-evaluation of patient's condition.  Abby Hocking, MD Emergency Medicine  ____________________________________________   INITIAL IMPRESSION / ASSESSMENT AND PLAN / ED COURSE  Pertinent labs & imaging results that were available during my care of the patient were reviewed by me and considered in my medical decision making (see chart for details).   This patient is Presenting for Evaluation of SOB, which does require a range of treatment options, and is a complaint that involves a high risk of morbidity and mortality.  The Differential Diagnoses include volume overload, AKI, CHF, PE, ACS, CAP, etc.  Critical Interventions-    Medications  sodium  chloride flush (NS) 0.9 % injection 3 mL (3 mLs Intravenous Given 08/03/23 2123)  sodium chloride  flush (NS) 0.9 % injection 3 mL (has no administration in time range)  0.9 %  sodium chloride  infusion (has no administration in time range)  acetaminophen  (TYLENOL ) tablet 650 mg (has no administration in time range)  ondansetron  (ZOFRAN ) injection 4 mg (has no administration in time range)  furosemide  (LASIX ) injection 40 mg (has no administration in time range)  metoprolol  tartrate (LOPRESSOR ) tablet 100 mg (100 mg Oral Given 08/03/23 2120)  rivaroxaban  (XARELTO ) tablet 20 mg (20 mg Oral Given 08/03/23 1838)  hydrALAZINE  (APRESOLINE ) tablet 75 mg (75 mg Oral Given by  Other 08/03/23 2119)  losartan  (COZAAR ) tablet 50 mg (50 mg Oral Given 08/04/23 0616)  senna (SENOKOT) tablet 8.6 mg (has no administration in time range)  cefTRIAXone  (ROCEPHIN ) 1 g in sodium chloride  0.9 % 100 mL IVPB (0 g Intravenous Stopped 08/04/23 0411)  hydrALAZINE  (APRESOLINE ) tablet 25 mg (25 mg Oral Given 08/03/23 1615)  furosemide  (LASIX ) injection 40 mg (40 mg Intravenous Given 08/03/23 1611)  hydrALAZINE  (APRESOLINE ) injection 10 mg (10 mg Intravenous Given 08/03/23 2238)  nitroGLYCERIN  (NITROGLYN) 2 % ointment 1 inch (1 inch Topical Given 08/04/23 0005)    Reassessment after intervention:  symptoms unchanged.    I did obtain Additional Historical Information from daughter at bedside.  I decided to review pertinent External Data, and in summary ECHO in 07/04/23 with EF 40-45%.   Clinical Laboratory Tests Ordered, included CBC with mild anemia. Remaining labs pending.   Radiologic Tests Ordered, included CXR. I independently interpreted the images and agree with radiology interpretation.   Cardiac Monitor Tracing showing NSR.    Social Determinants of Health Risk is a non-smoker.   Medical Decision Making: Summary:  Patient presents the emergency department for evaluation of shortness of breath.  Increased O2 requirement now at 5 L nasal cannula with O2 in the low 90s. Clinically appears volume up.  Labs in office yesterday show worsening vascular congestion on chest x-ray with elevated BNP with some chronic CKD.  May benefit from IV diuresis in the inpatient setting.  His Lasix  has been increased but not benefiting from this yet.   Reevaluation with update and discussion with patient.  Chest x-ray consistent with vascular congestion and volume overload.  Care transferred to Dr. Lewis Red pending remaining blood work and likely admit for inpatient diuresis.    Patient's presentation is most consistent with acute presentation with potential threat to life or bodily function.    Disposition: admit  ____________________________________________  FINAL CLINICAL IMPRESSION(S) / ED DIAGNOSES  Final diagnoses:  Acute systolic congestive heart failure (HCC)   Note:  This document was prepared using Dragon voice recognition software and may include unintentional dictation errors.  Abby Hocking, MD, [facility] Emergency Medicine    Arhum Peeples, Shereen Dike, MD 08/04/23 [phone]

## 2023-08-03 NOTE — ED Notes (Signed)
 Blood in patients urine, advised MD [facility] he will follow up with Urology.

## 2023-08-04 ENCOUNTER — Telehealth: Payer: Self-pay

## 2023-08-04 DIAGNOSIS — I5020 Unspecified systolic (congestive) heart failure: Secondary | ICD-10-CM | POA: Diagnosis not present

## 2023-08-04 LAB — BASIC METABOLIC PANEL WITH GFR
Anion gap: 12 (ref 5–15)
BUN: 22 mg/dL (ref 8–23)
CO2: 25 mmol/L (ref 22–32)
Calcium: 9 mg/dL (ref 8.9–10.3)
Chloride: 107 mmol/L (ref 98–111)
Creatinine, Ser: 1.39 mg/dL — ABNORMAL HIGH (ref 0.61–1.24)
GFR, Estimated: 54 mL/min — ABNORMAL LOW (ref >60)
Glucose, Bld: 111 mg/dL — ABNORMAL HIGH (ref 70–99)
Potassium: 3.8 mmol/L (ref 3.5–5.1)
Sodium: 144 mmol/L (ref 135–145)

## 2023-08-04 LAB — CBC
HCT: 38.6 % — ABNORMAL LOW (ref 39.0–52.0)
Hemoglobin: 12.1 g/dL — ABNORMAL LOW (ref 13.0–17.0)
MCH: 28.7 pg (ref 26.0–34.0)
MCHC: 31.3 g/dL (ref 30.0–36.0)
MCV: 91.7 fL (ref 80.0–100.0)
Platelets: 254 10*3/uL (ref 150–400)
RBC: 4.21 MIL/uL — ABNORMAL LOW (ref 4.22–5.81)
RDW: 15 % (ref 11.5–15.5)
WBC: 8.6 10*3/uL (ref 4.0–10.5)
nRBC: 0 % (ref 0.0–0.2)

## 2023-08-04 MED ORDER — SODIUM CHLORIDE 0.9 % IV SOLN
1.0000 g | INTRAVENOUS | Status: DC
  Administered 2023-08-04 – 2023-08-07 (×4): 1 g via INTRAVENOUS
  Filled 2023-08-04 (×4): qty 10

## 2023-08-04 MED ORDER — LOSARTAN POTASSIUM 25 MG PO TABS
25.0000 mg | ORAL_TABLET | Freq: Every morning | ORAL | Status: DC
  Administered 2023-08-05 – 2023-08-07 (×3): 25 mg via ORAL
  Filled 2023-08-04 (×3): qty 1

## 2023-08-04 NOTE — ED Notes (Addendum)
 Per lab unable to run 0137am CBC unless a BMP was sent down that was due at the same time. This nurse advised lab there was no BMP ordered until 5 am. Lab stated BMP will need to be cancelled and reordered. This nurse reordered BMP for 5 am x 3 days

## 2023-08-04 NOTE — ED Notes (Signed)
 Called 4E to advise patient in route to floor they were receptive.

## 2023-08-04 NOTE — ED Notes (Signed)
 NM met with the patient and family member at the bedside to address concerns. The family expressed frustration regarding the patient's previously assigned inpatient bed being given to another patient. This concern appeared to stem from a text notification indicating the bed was being cleaned, followed by a call to the unit for an update in which family member felt the bed was taken away due to her attempting to call. The bed placement process was reviewed with the family.  Additional concerns included infrequent emptying of the Foley catheter. The concern was acknowledged, and the assigned RN set a two-hour timer to routinely check and empty the Foley bag. The family also noted changes in urine color and expressed concern about a possible infection. This information will be relayed to the covering physician for further evaluation.  To enhance patient comfort, a hospital bed was ordered and a recliner was provided to the family member. No further needs were reported at this time.

## 2023-08-04 NOTE — Plan of Care (Signed)

## 2023-08-04 NOTE — Progress Notes (Addendum)
 0730 patient alert x4 on 3.5 L  able to make all needs known has chronic SP cath bloody urine noted with shift change. 0900 meds given as ordered  1145 patient HR 145 ST with ambulating to bathroom with wife to have BM. NSR after  1330 urology seen patient and patient urine now yellow patient emptied the bloody urine from SP bag. Plan of care is to inform urology if there is any other signs of blood in the urine. Patient educated again not to empty the SP bag that we need to keep count and monitor for blood

## 2023-08-04 NOTE — Significant Event (Signed)
 Patient's nurse notified me that patient was having hematuria through the suprapubic catheter.  Suprapubic catheter was placed yesterday at [facility] urology office.  I reviewed patient's labs and labs and medications.  Patient is also on Xarelto  last dose was taken last evening around 6 PM.  I discussed with on-call urologist Dr. Alphonza Ashing advised at this time to irrigate the bladder through the suprapubic catheter with normal saline.  And urology will be seeing patient in consult.  Will check stat CBC.  Urology stated that they may start continuous bladder irrigation if continues to have hematuria.  May have to hold Xarelto  if bleeding worsens.  Initial UA was showing some signs of possible infection so placed on ceftriaxone .  Melford Spotted.

## 2023-08-04 NOTE — Progress Notes (Signed)
 Patient has home CPAP machine.  RT available if needed.

## 2023-08-04 NOTE — Progress Notes (Signed)
 PT Cancellation Note  Patient Details Name: Thomas [name] MRN: [id number] DOB: January 29, 1951   Cancelled Treatment:    Reason Eval/Treat Not Completed: Other (comment). Pt reports he has been amb in room several times today. Is putting on cpap for a nap. Will check on tomorrow but he may not need PT intervention.   [name] [facility] 08/04/2023, 2:40 PM Angelina Kempf PT Acute [employer] [phone]

## 2023-08-04 NOTE — Progress Notes (Signed)
 PT Cancellation Note  Patient Details Name: Thomas [name] MRN: [id number] DOB: [birth year]   Cancelled Treatment:    Reason Eval/Treat Not Completed: Other (comment). Pt reported he just walked to the bathroom and back and requested to wait a little bit before getting up again.   [name] [facility] 08/04/2023, 11:49 AM Angelina Kempf PT Acute [employer] [phone]

## 2023-08-04 NOTE — ED Notes (Signed)
 Per [facility] who contacted this medic via cell phone, pt subpubic catheter is to be flushed every 3 hours Subpubic catheter flushed with 40cc of normal saline with Laurina Popper, RN

## 2023-08-04 NOTE — Progress Notes (Signed)
   08/04/23 0135  BiPAP/CPAP/SIPAP  $ Face Mask Medium Yes  BiPAP/CPAP/SIPAP Pt Type Adult  BiPAP/CPAP/SIPAP Resmed  Mask Type Full face mask  Dentures removed? Not applicable  Mask Size Medium  Flow Rate 4 lpm  Patient Home Machine Yes  Safety Check Completed by RT for Home Unit Yes, no issues noted  Patient Home Mask No  Patient Home Tubing Yes  Device Plugged into RED Power Outlet Yes   Pt placed on his home unit/settings with hospital supplied full face mask.

## 2023-08-04 NOTE — ED Notes (Signed)
 Pt switched over to hospital bed and recliner given to family member.

## 2023-08-04 NOTE — Plan of Care (Signed)
 Patient transferred from [facility] urology office following SPT exchange due to shortness of breath/hypoxia.  Urology was consulted this morning due to report of hematuria.  On rounds this morning patient and his daughter insist that he has not had any hematuria during this admission.  In discussion with his nurse, she maintains that he did this morning.  No acute urologic intervention indicated at this time.  No charge for this visit

## 2023-08-04 NOTE — Progress Notes (Addendum)
 PROGRESS NOTE    Thomas [name]  UVO:[id number] DOB: 1950-03-28 DOA: 08/03/2023 PCP: Verma Gobble, NP   73/M w chronic respiratory failure on 3 L home O2, OSA OHS, pulmonary hypertension, DVT/PE on Xarelto , systolic CHF, paroxysmal A-fib, type 2 diabetes mellitus, prostate cancer, urinary retention, chronic suprapubic catheter presented to the ED with shortness of breath and recent weight gain.  Recently underwent cardioversion on 6/10 followed by mild improvement however symptoms worsened with progressive dyspnea, fatigue, worsening hypoxia.  Compliant with diuretics and diet He was at the urology office 6/17 had his suprapubic catheter exchange when he was noted to be hypoxic in the 80s despite being on his 4 L of oxygen .  He was transferred to the ED for further evaluation.  In the ED hypertensive, hypoxic, BNP 994, troponin 21, creatinine 1.6, chest x-ray with cardiomegaly, pulmonary vascular congestion   Subjective: -Overnight developed some hematuria, breathing is improving  Assessment and Plan:  Acute on chronic respiratory failure  Acute on chronic combined CHF  - History of progressive dyspnea, 14 pound weight gain despite Mounjaro  -Recent cardioversion 6/10, currently in sinus rhythm, suspect volume overload likely triggered by recent prolonged A-fib -Continue IV Lasix  today, losartan , metoprolol , hydralazine  -Poor candidate for SGLT2i with suprapubic catheter -Wean O2, increase activity  Scant hematuria - Pink-tinged urine overnight - Denies overt symptoms of infection however was started on antibiotics overnight in the setting of hematuria, UA with many bacteria, greater than 50 WBCs - Follow-up urine culture - Hold Xarelto  if hematuria worsens  Essential hypertension Improving - Continue hydralazine , metoprolol , losartan    History of DVT/pulmonary embolism on chronic anticoagulation - Continue Xarelto  for now, may need to hold if hematuria worsens    Paroxysmal atrial fibrillation on chronic anticoagulation Patient underwent cardioversion on 6/10.  Currently appears to be in a sinus rhythm. - Continue metoprolol  and Xarelto    Diabetes mellitus type 2, without long-term use of insulin  Last available hemoglobin A1c was 5.5 when checked on 05/24/2023. - Held metformin , Jardiance , and Mounjaro    Normocytic anemia Stay well, monitor   Chronic kidney disease stage IIIb Creatinine noted to be 1.6.  Baseline creatinine appears to range from 1.5-1.7. - Continue to monitor kidney function with diuresis   Urinary retention s/p suprapubic catheter Patient had suprapubic catheter replaced by urology prior to arrival.  6/18   Morbid obesity BMI 47.21 kg/m. - Continue Mounjaro  in outpatient setting   OSA - Continue CPAP at night   DVT prophylaxis: Xarelto  Advance Care Planning:   Code Status: Full Code   Family Communication: Patient's daughter at bedside. Dispo: Home likely 1 to 2 days   Objective: Vitals:   08/04/23 0900 08/04/23 0935 08/04/23 1133 08/04/23 1200  BP:  (!) 154/71 125/65 117/74  Pulse: 62 67 76 75  Resp: (!) 21 19  19   Temp:   98 F (36.7 C)   TempSrc:   Oral   SpO2: 93% 94% 92% 91%  Weight:      Height:        Intake/Output Summary (Last 24 hours) at 08/04/2023 1211 Last data filed at 08/04/2023 1000 Gross per 24 hour  Intake 100 ml  Output 3550 ml  Net -3450 ml   Filed Weights   08/03/23 1117 08/04/23 0521  Weight: (!) 145 kg (!) 139.4 kg    Examination:  General exam: Obese chronically ill male laying in bed, AAO x 3 HEENT: Neck is unable to assess JVD CVS: S1-S2, regular rhythm Lungs: Distant  breath sounds otherwise clear Abdomen: Soft, nontender, bowel sounds present, suprapubic catheter  extremities: 1+ edema  Skin: No rashes Psychiatry:  Mood & affect appropriate.     Data Reviewed:   CBC: Recent Labs  Lab 08/02/23 1109 08/03/23 1238 08/04/23 0137  WBC 9.1 8.8 8.6  NEUTROABS  7,680 7.3  --   HGB 12.3* 12.3* 12.1*  HCT 39.6 39.4 38.6*  MCV 92.1 92.1 91.7  PLT 283 257 254   Basic Metabolic Panel: Recent Labs  Lab 08/02/23 1109 08/03/23 1238 08/04/23 0811  NA 144 144 144  K 4.4 4.2 3.8  CL 109 109 107  CO2 24 24 25   GLUCOSE 113* 102* 111*  BUN 26* 25* 22  CREATININE 1.49* 1.60* 1.39*  CALCIUM  9.3 9.1 9.0   GFR: Estimated Creatinine Clearance: 65.7 mL/min (A) (by C-G formula based on SCr of 1.39 mg/dL (H)). Liver Function Tests: Recent Labs  Lab 08/02/23 1109 08/03/23 1238  AST 15 25  ALT 34 38  ALKPHOS  --  56  BILITOT 1.0 1.7*  PROT 6.8 7.3  ALBUMIN  --  3.7   No results for input(s): LIPASE, AMYLASE in the last 168 hours. No results for input(s): AMMONIA in the last 168 hours. Coagulation Profile: No results for input(s): INR, PROTIME in the last 168 hours. Cardiac Enzymes: No results for input(s): CKTOTAL, CKMB, CKMBINDEX, TROPONINI in the last 168 hours. BNP (last 3 results) Recent Labs    11/03/22 1000  PROBNP 659*   HbA1C: No results for input(s): HGBA1C in the last 72 hours. CBG: No results for input(s): GLUCAP in the last 168 hours. Lipid Profile: No results for input(s): CHOL, HDL, LDLCALC, TRIG, CHOLHDL, LDLDIRECT in the last 72 hours. Thyroid  Function Tests: No results for input(s): TSH, T4TOTAL, FREET4, T3FREE, THYROIDAB in the last 72 hours. Anemia Panel: No results for input(s): VITAMINB12, FOLATE, FERRITIN, TIBC, IRON , RETICCTPCT in the last 72 hours. Urine analysis:    Component Value Date/Time   COLORURINE YELLOW 08/03/2023 1634   APPEARANCEUR HAZY (A) 08/03/2023 1634   LABSPEC 1.010 08/03/2023 1634   PHURINE 6.0 08/03/2023 1634   GLUCOSEU >=500 (A) 08/03/2023 1634   HGBUR MODERATE (A) 08/03/2023 1634   BILIRUBINUR NEGATIVE 08/03/2023 1634   BILIRUBINUR Negatuve 09/15/2019 1340   KETONESUR NEGATIVE 08/03/2023 1634   PROTEINUR 100 (A) 08/03/2023 1634    UROBILINOGEN 0.2 09/15/2019 1340   UROBILINOGEN 0.2 05/19/2014 1412   NITRITE NEGATIVE 08/03/2023 1634   LEUKOCYTESUR LARGE (A) 08/03/2023 1634   Sepsis Labs: @LABRCNTIP (procalcitonin:4,lacticidven:4)  ) Recent Results (from the past 240 hours)  Resp panel by RT-PCR (RSV, Flu A&B, Covid) Anterior Nasal Swab     Status: None   Collection Time: 08/03/23  4:20 PM   Specimen: Anterior Nasal Swab  Result Value Ref Range Status   SARS Coronavirus 2 by RT PCR NEGATIVE NEGATIVE Final   Influenza A by PCR NEGATIVE NEGATIVE Final   Influenza B by PCR NEGATIVE NEGATIVE Final    Comment: (NOTE) The Xpert Xpress SARS-CoV-2/FLU/RSV plus assay is intended as an aid in the diagnosis of influenza from Nasopharyngeal swab specimens and should not be used as a sole basis for treatment. Nasal washings and aspirates are unacceptable for Xpert Xpress SARS-CoV-2/FLU/RSV testing.  Fact Sheet for Patients: BloggerCourse.com  Fact Sheet for Healthcare Providers: SeriousBroker.it  This test is not yet approved or cleared by the [place]  FDA and has been authorized for detection and/or diagnosis of SARS-CoV-2 by FDA under an Emergency Use  Authorization (EUA). This EUA will remain in effect (meaning this test can be used) for the duration of the COVID-19 declaration under Section 564(b)(1) of the Act, 21 U.S.C. section 360bbb-3(b)(1), unless the authorization is terminated or revoked.     Resp Syncytial Virus by PCR NEGATIVE NEGATIVE Final    Comment: (NOTE) Fact Sheet for Patients: BloggerCourse.com  Fact Sheet for Healthcare Providers: SeriousBroker.it  This test is not yet approved or cleared by the [place]  FDA and has been authorized for detection and/or diagnosis of SARS-CoV-2 by FDA under an Emergency Use Authorization (EUA). This EUA will remain in effect (meaning this test can  be used) for the duration of the COVID-19 declaration under Section 564(b)(1) of the Act, 21 U.S.C. section 360bbb-3(b)(1), unless the authorization is terminated or revoked.  Performed at [facility], [address] [place], [place] [address]   Urine Culture     Status: None (Preliminary result)   Collection Time: 08/03/23  4:34 PM   Specimen: Urine, Catheterized  Result Value Ref Range Status   Specimen Description URINE, CATHETERIZED  Final   Special Requests   Final    NONE Reflexed from [id number] Performed at [facility], [address] [place], [place] [address]    Culture PENDING  Incomplete   Report Status PENDING  Incomplete     Radiology Studies: DG Chest 2 View Result Date: 08/03/2023 CLINICAL DATA:  Short of breath, hypoxia EXAM: CHEST - 2 VIEW COMPARISON:  Prior chest x-ray 08/02/2023 FINDINGS: Cardiomegaly and pulmonary vascular congestion without overt edema. Chronic elevation of the left hemidiaphragm with associated left lower lobe atelectasis. IMPRESSION: Similar appearance of the chest including cardiomegaly and pulmonary vascular congestion without overt edema. Chronic elevation of the left hemidiaphragm with chronic atelectasis. Electronically Signed   By: Fernando Hoyer M.D.   On: 08/03/2023 14:58     Scheduled Meds:  furosemide   40 mg Intravenous BID   hydrALAZINE   75 mg Oral TID   losartan   50 mg Oral q AM   metoprolol  tartrate  100 mg Oral BID   rivaroxaban   20 mg Oral Q supper   sodium chloride  flush  3 mL Intravenous Q12H   Continuous Infusions:  sodium chloride      cefTRIAXone  (ROCEPHIN )  IV Stopped (08/04/23 0411)     LOS: 1 day    Time spent:    Deforest Fast, MD [facility]   08/04/2023, 12:11 PM

## 2023-08-05 ENCOUNTER — Other Ambulatory Visit (HOSPITAL_COMMUNITY): Payer: Self-pay

## 2023-08-05 ENCOUNTER — Telehealth: Payer: Self-pay

## 2023-08-05 ENCOUNTER — Ambulatory Visit: Admitting: Cardiology

## 2023-08-05 DIAGNOSIS — I5020 Unspecified systolic (congestive) heart failure: Secondary | ICD-10-CM | POA: Diagnosis not present

## 2023-08-05 LAB — URINE CULTURE: Culture: >=100000 — AB

## 2023-08-05 LAB — BASIC METABOLIC PANEL WITH GFR
Anion gap: 17 — ABNORMAL HIGH (ref 5–15)
BUN: 27 mg/dL — ABNORMAL HIGH (ref 8–23)
CO2: 22 mmol/L (ref 22–32)
Calcium: 8.7 mg/dL — ABNORMAL LOW (ref 8.9–10.3)
Chloride: 106 mmol/L (ref 98–111)
Creatinine, Ser: 1.53 mg/dL — ABNORMAL HIGH (ref 0.61–1.24)
GFR, Estimated: 48 mL/min — ABNORMAL LOW (ref >60)
Glucose, Bld: 86 mg/dL (ref 70–99)
Potassium: 4 mmol/L (ref 3.5–5.1)
Sodium: 145 mmol/L (ref 135–145)

## 2023-08-05 LAB — CBC
HCT: 38.6 % — ABNORMAL LOW (ref 39.0–52.0)
Hemoglobin: 11.9 g/dL — ABNORMAL LOW (ref 13.0–17.0)
MCH: 28.6 pg (ref 26.0–34.0)
MCHC: 30.8 g/dL (ref 30.0–36.0)
MCV: 92.8 fL (ref 80.0–100.0)
Platelets: 237 10*3/uL (ref 150–400)
RBC: 4.16 MIL/uL — ABNORMAL LOW (ref 4.22–5.81)
RDW: 15.1 % (ref 11.5–15.5)
WBC: 7.4 10*3/uL (ref 4.0–10.5)
nRBC: 0 % (ref 0.0–0.2)

## 2023-08-05 MED ORDER — ATORVASTATIN CALCIUM 40 MG PO TABS
40.0000 mg | ORAL_TABLET | Freq: Every day | ORAL | Status: DC
  Administered 2023-08-05 – 2023-08-07 (×3): 40 mg via ORAL
  Filled 2023-08-05 (×3): qty 1

## 2023-08-05 NOTE — Progress Notes (Signed)
 PROGRESS NOTE    Thomas [name]  WUJ:[id number] DOB: Aug 08, 1950 DOA: 08/03/2023 PCP: Verma Gobble, NP   73/M w chronic respiratory failure on 3 L home O2, OSA OHS, pulmonary hypertension, DVT/PE on Xarelto , systolic CHF, paroxysmal A-fib, type 2 diabetes mellitus, prostate cancer, urinary retention, chronic suprapubic catheter presented to the ED with shortness of breath and recent weight gain.  Recently underwent cardioversion on 6/10 followed by mild improvement however symptoms worsened with progressive dyspnea, fatigue, worsening hypoxia.  Compliant with diuretics and diet He was at the urology office 6/17 had his suprapubic catheter exchange when he was noted to be hypoxic in the 80s despite being on his 4 L of oxygen .  He was transferred to the ED for further evaluation.  In the ED hypertensive, hypoxic, BNP 994, troponin 21, creatinine 1.6, chest x-ray with cardiomegaly, pulmonary vascular congestion   Subjective: -Overnight developed some hematuria, breathing is improving  Assessment and Plan:  Acute on chronic respiratory failure  Acute on chronic combined CHF  - History of progressive dyspnea, 14 pound weight gain despite Mounjaro  -Recent cardioversion 6/10, currently in sinus rhythm, suspect volume overload likely triggered by recent prolonged A-fib - Improving with diuresis, 3.5 L negative continue IV Lasix  today, losartan , metoprolol , hydralazine  -Poor candidate for SGLT2i with suprapubic catheter -Wean O2, increase activity  Hematuria Klebsiella UTI -Had some hematuria yesterday which resolved -Recurrence of hematuria overnight, more pronounced now, will hold Xarelto  -Will discuss with urology -Suprapubic catheter was changed just prior to admission  Essential hypertension Improving - Continue hydralazine , metoprolol , losartan    History of DVT/pulmonary embolism on chronic anticoagulation - Holding Xarelto , see discussion above   Paroxysmal atrial  fibrillation on chronic anticoagulation Patient underwent cardioversion on 6/10.  Currently appears to be in a sinus rhythm. - Continue metoprolol , holding Xarelto  as above   Diabetes mellitus type 2, without long-term use of insulin  Last available hemoglobin A1c was 5.5 when checked on 05/24/2023. - Held metformin , Jardiance , and Mounjaro    Normocytic anemia Stay well, monitor   Chronic kidney disease stage IIIb Creatinine noted to be 1.6.  Baseline creatinine appears to range from 1.5-1.7. - Continue to monitor kidney function with diuresis   Urinary retention s/p suprapubic catheter Patient had suprapubic catheter replaced by urology prior to arrival.  6/18   Morbid obesity BMI 47.21 kg/m. - Continue Mounjaro  in outpatient setting   OSA - Continue CPAP at night   DVT prophylaxis: Xarelto  Advance Care Planning:   Code Status: Full Code   Family Communication: Patient's daughter at bedside.  Yesterday Dispo: Home likely 1 to 2 days   Objective: Vitals:   08/04/23 2308 08/05/23 0230 08/05/23 0808 08/05/23 1144  BP: (!) 151/86 (!) 142/83 (!) 160/81 136/76  Pulse: 67 68 66 75  Resp: 20 20 20 20   Temp: 98.8 F (37.1 C) 98.5 F (36.9 C) 97.9 F (36.6 C) 97.8 F (36.6 C)  TempSrc: Oral Oral Oral Oral  SpO2: 100% 96% 96% 93%  Weight:      Height:        Intake/Output Summary (Last 24 hours) at 08/05/2023 1231 Last data filed at 08/05/2023 1049 Gross per 24 hour  Intake 960 ml  Output 2600 ml  Net -1640 ml   Filed Weights   08/03/23 1117 08/04/23 0521  Weight: (!) 145 kg (!) 139.4 kg    Examination:  General exam: Obese chronically ill male laying in bed, AAO x 3 HEENT: Neck is unable to assess JVD CVS:  S1-S2, regular rhythm Lungs: Distant breath sounds otherwise clear Abdomen: Soft, nontender, bowel sounds present, suprapubic catheter  extremities: 1+ edema  Skin: No rashes Psychiatry:  Mood & affect appropriate.     Data Reviewed:   CBC: Recent Labs   Lab 08/02/23 1109 08/03/23 1238 08/04/23 0137 08/05/23 0918  WBC 9.1 8.8 8.6 7.4  NEUTROABS 7,680 7.3  --   --   HGB 12.3* 12.3* 12.1* 11.9*  HCT 39.6 39.4 38.6* 38.6*  MCV 92.1 92.1 91.7 92.8  PLT 283 257 254 237   Basic Metabolic Panel: Recent Labs  Lab 08/02/23 1109 08/03/23 1238 08/04/23 0811 08/05/23 0918  NA 144 144 144 145  K 4.4 4.2 3.8 4.0  CL 109 109 107 106  CO2 24 24 25 22   GLUCOSE 113* 102* 111* 86  BUN 26* 25* 22 27*  CREATININE 1.49* 1.60* 1.39* 1.53*  CALCIUM  9.3 9.1 9.0 8.7*   GFR: Estimated Creatinine Clearance: 59.7 mL/min (A) (by C-G formula based on SCr of 1.53 mg/dL (H)). Liver Function Tests: Recent Labs  Lab 08/02/23 1109 08/03/23 1238  AST 15 25  ALT 34 38  ALKPHOS  --  56  BILITOT 1.0 1.7*  PROT 6.8 7.3  ALBUMIN  --  3.7   No results for input(s): LIPASE, AMYLASE in the last 168 hours. No results for input(s): AMMONIA in the last 168 hours. Coagulation Profile: No results for input(s): INR, PROTIME in the last 168 hours. Cardiac Enzymes: No results for input(s): CKTOTAL, CKMB, CKMBINDEX, TROPONINI in the last 168 hours. BNP (last 3 results) Recent Labs    11/03/22 1000  PROBNP 659*   HbA1C: No results for input(s): HGBA1C in the last 72 hours. CBG: No results for input(s): GLUCAP in the last 168 hours. Lipid Profile: No results for input(s): CHOL, HDL, LDLCALC, TRIG, CHOLHDL, LDLDIRECT in the last 72 hours. Thyroid  Function Tests: No results for input(s): TSH, T4TOTAL, FREET4, T3FREE, THYROIDAB in the last 72 hours. Anemia Panel: No results for input(s): VITAMINB12, FOLATE, FERRITIN, TIBC, IRON , RETICCTPCT in the last 72 hours. Urine analysis:    Component Value Date/Time   COLORURINE YELLOW 08/03/2023 1634   APPEARANCEUR HAZY (A) 08/03/2023 1634   LABSPEC 1.010 08/03/2023 1634   PHURINE 6.0 08/03/2023 1634   GLUCOSEU >=500 (A) 08/03/2023 1634   HGBUR MODERATE  (A) 08/03/2023 1634   BILIRUBINUR NEGATIVE 08/03/2023 1634   BILIRUBINUR Negatuve 09/15/2019 1340   KETONESUR NEGATIVE 08/03/2023 1634   PROTEINUR 100 (A) 08/03/2023 1634   UROBILINOGEN 0.2 09/15/2019 1340   UROBILINOGEN 0.2 05/19/2014 1412   NITRITE NEGATIVE 08/03/2023 1634   LEUKOCYTESUR LARGE (A) 08/03/2023 1634   Sepsis Labs: @LABRCNTIP (procalcitonin:4,lacticidven:4)  ) Recent Results (from the past 240 hours)  Resp panel by RT-PCR (RSV, Flu A&B, Covid) Anterior Nasal Swab     Status: None   Collection Time: 08/03/23  4:20 PM   Specimen: Anterior Nasal Swab  Result Value Ref Range Status   SARS Coronavirus 2 by RT PCR NEGATIVE NEGATIVE Final   Influenza A by PCR NEGATIVE NEGATIVE Final   Influenza B by PCR NEGATIVE NEGATIVE Final    Comment: (NOTE) The Xpert Xpress SARS-CoV-2/FLU/RSV plus assay is intended as an aid in the diagnosis of influenza from Nasopharyngeal swab specimens and should not be used as a sole basis for treatment. Nasal washings and aspirates are unacceptable for Xpert Xpress SARS-CoV-2/FLU/RSV testing.  Fact Sheet for Patients: BloggerCourse.com  Fact Sheet for Healthcare Providers: SeriousBroker.it  This test is not  yet approved or cleared by the [place]  FDA and has been authorized for detection and/or diagnosis of SARS-CoV-2 by FDA under an Emergency Use Authorization (EUA). This EUA will remain in effect (meaning this test can be used) for the duration of the COVID-19 declaration under Section 564(b)(1) of the Act, 21 U.S.C. section 360bbb-3(b)(1), unless the authorization is terminated or revoked.     Resp Syncytial Virus by PCR NEGATIVE NEGATIVE Final    Comment: (NOTE) Fact Sheet for Patients: BloggerCourse.com  Fact Sheet for Healthcare Providers: SeriousBroker.it  This test is not yet approved or cleared by the [place]  FDA  and has been authorized for detection and/or diagnosis of SARS-CoV-2 by FDA under an Emergency Use Authorization (EUA). This EUA will remain in effect (meaning this test can be used) for the duration of the COVID-19 declaration under Section 564(b)(1) of the Act, 21 U.S.C. section 360bbb-3(b)(1), unless the authorization is terminated or revoked.  Performed at [facility], [address], [place], [place] [address]   Urine Culture     Status: Abnormal   Collection Time: 08/03/23  4:34 PM   Specimen: Urine, Catheterized  Result Value Ref Range Status   Specimen Description URINE, CATHETERIZED  Final   Special Requests   Final    NONE Reflexed from [id number] Performed at [facility], [address], [place], [place] [address]    Culture >=100,000 COLONIES/mL KLEBSIELLA PNEUMONIAE (A)  Final   Report Status 08/05/2023 FINAL  Final   Organism ID, Bacteria KLEBSIELLA PNEUMONIAE (A)  Final      Susceptibility   Klebsiella pneumoniae - MIC*    AMPICILLIN RESISTANT Resistant     CEFAZOLIN  <=4 SENSITIVE Sensitive     CEFEPIME <=0.12 SENSITIVE Sensitive     CEFTRIAXONE  <=0.25 SENSITIVE Sensitive     CIPROFLOXACIN  <=0.25 SENSITIVE Sensitive     GENTAMICIN  <=1 SENSITIVE Sensitive     IMIPENEM 1 SENSITIVE Sensitive     NITROFURANTOIN  128 RESISTANT Resistant     TRIMETH /SULFA  <=20 SENSITIVE Sensitive     AMPICILLIN/SULBACTAM 4 SENSITIVE Sensitive     PIP/TAZO <=4 SENSITIVE Sensitive ug/mL    * >=100,000 COLONIES/mL KLEBSIELLA PNEUMONIAE     Radiology Studies: DG Chest 2 View Result Date: 08/03/2023 CLINICAL DATA:  Short of breath, hypoxia EXAM: CHEST - 2 VIEW COMPARISON:  Prior chest x-ray 08/02/2023 FINDINGS: Cardiomegaly and pulmonary vascular congestion without overt edema. Chronic elevation of the left hemidiaphragm with associated left lower lobe atelectasis. IMPRESSION: Similar appearance of the chest including cardiomegaly and pulmonary vascular congestion without overt  edema. Chronic elevation of the left hemidiaphragm with chronic atelectasis. Electronically Signed   By: Fernando Hoyer M.D.   On: 08/03/2023 14:58     Scheduled Meds:  furosemide   40 mg Intravenous BID   hydrALAZINE   75 mg Oral TID   losartan   25 mg Oral q AM   metoprolol  tartrate  100 mg Oral BID   Continuous Infusions:  cefTRIAXone  (ROCEPHIN )  IV Stopped (08/05/23 0344)     LOS: 2 days    Time spent:    Deforest Fast, MD [facility]   08/05/2023, 12:31 PM

## 2023-08-05 NOTE — Evaluation (Signed)
 Physical Therapy Evaluation Patient Details Name: Thomas [name] MRN: [id number] DOB: 1951/01/27 Today's Date: 08/05/2023  History of Present Illness  Patient is a 73 year old male transferred from urology office with hypoxia, acute on chronic respiratory failure, acute on chronic CHF. PMH: respiratory failure on 3 L02, OSA OHS, pulmonary hypertension, DVT/PE, CHF, paroxysmal A-fib, type 2 DM, prostate cancer, urinary retention, chronic suprapubic catheter  Clinical Impression  Patient reports he walks very limited distance at baseline in the home. He uses 3 L at rest, increasing to 4 L with mobility at baseline. He has a wheelchair for community mobility and goes out mostly just for doctor appointments. He has a supportive spouse.  The patient reports he has already walked today and declined walking during this visit. Mild dyspnea with bed mobility. Sitting balance is good and no physical assistance required with bed mobility or lateral scooting. The patient is interested in home health PT if possible. Recommend to continue PT to maximize independence and to decrease caregiver burden.       If plan is discharge home, recommend the following: A little help with bathing/dressing/bathroom;Help with stairs or ramp for entrance;Assistance with cooking/housework   Can travel by private vehicle        Equipment Recommendations None recommended by PT  Recommendations for Other Services       Functional Status Assessment Patient has had a recent decline in their functional status and demonstrates the ability to make significant improvements in function in a reasonable and predictable amount of time.     Precautions / Restrictions Precautions Precautions: Fall Recall of Precautions/Restrictions: Intact Precaution/Restrictions Comments: monitor 02 Restrictions Weight Bearing Restrictions Per Provider Order: No      Mobility  Bed Mobility Overal bed mobility: Needs Assistance Bed  Mobility: Supine to Sit, Sit to Supine     Supine to sit: Supervision, HOB elevated Sit to supine: Supervision, HOB elevated   General bed mobility comments: increased time    Transfers Overall transfer level: Needs assistance Equipment used: None Transfers: Bed to chair/wheelchair/BSC            Lateral/Scoot Transfers: Supervision General transfer comment: patient declined standing or getting out of bed due to fatigue with minimal activity.    Ambulation/Gait               General Gait Details: patient declined but did walk earlier today with mobility team. patient has been going to bathroom with family assistance  Stairs            Wheelchair Mobility     Tilt Bed    Modified Rankin (Stroke Patients Only)       Balance Overall balance assessment: Needs assistance Sitting-balance support: Feet supported Sitting balance-Leahy Scale: Good                                       Pertinent Vitals/Pain Pain Assessment Pain Assessment: No/denies pain    Home Living Family/patient expects to be discharged to:: Private residence Living Arrangements: Spouse/significant other Available Help at Discharge: Family;Available 24 hours/day Type of Home: House Home Access: Stairs to enter Entrance Stairs-Rails: Right;Left;Can reach both Entrance Stairs-Number of Steps: 16     Home Equipment: Agricultural consultant (2 wheels);Cane - single point;Shower seat;Wheelchair - manual;Wheelchair - power (oxygen )      Prior Function Prior Level of Function : Needs assist  Mobility Comments: short distance ambulation with rolling walker in the home. w/c for community mobility. 3 L02 at rest, increasing to 4 L PRN with mobility ADLs Comments: Mod I but has assistance from spouse as needed     Extremity/Trunk Assessment   Upper Extremity Assessment Upper Extremity Assessment: Overall WFL for tasks assessed    Lower Extremity  Assessment Lower Extremity Assessment: Generalized weakness (endurance imparied for sustained activity)       Communication   Communication Communication: No apparent difficulties    Cognition Arousal: Alert Behavior During Therapy: WFL for tasks assessed/performed   PT - Cognitive impairments: No apparent impairments                         Following commands: Intact       Cueing Cueing Techniques: Verbal cues     General Comments General comments (skin integrity, edema, etc.): Sp02 90-94% on 4 L02 while sitting. mild dyspnea after sitting up. activity tolerance limited by fatigue    Exercises     Assessment/Plan    PT Assessment Patient needs continued PT services  PT Problem List Decreased strength;Decreased range of motion;Decreased activity tolerance;Decreased balance;Decreased mobility;Cardiopulmonary status limiting activity       PT Treatment Interventions DME instruction;Gait training;Stair training;Functional mobility training;Therapeutic activities;Therapeutic exercise;Balance training;Neuromuscular re-education;Patient/family education    PT Goals (Current goals can be found in the Care Plan section)  Acute Rehab PT Goals Patient Stated Goal: daughter is requesting home health PT PT Goal Formulation: With patient/family Time For Goal Achievement: 08/19/23 Potential to Achieve Goals: Fair    Frequency Min 1X/week     Co-evaluation               AM-PAC PT 6 Clicks Mobility  Outcome Measure Help needed turning from your back to your side while in a flat bed without using bedrails?: A Little Help needed moving from lying on your back to sitting on the side of a flat bed without using bedrails?: A Little Help needed moving to and from a bed to a chair (including a wheelchair)?: A Little Help needed standing up from a chair using your arms (e.g., wheelchair or bedside chair)?: A Little Help needed to walk in hospital room?: A  Little Help needed climbing 3-5 steps with a railing? : A Lot 6 Click Score: 17    End of Session Equipment Utilized During Treatment: Oxygen  Activity Tolerance: Patient limited by fatigue Patient left: in bed;with call bell/phone within reach;with family/visitor present   PT Visit Diagnosis: Muscle weakness (generalized) (M62.81);Difficulty in walking, not elsewhere classified (R26.2)    Time: 1222-1238 PT Time Calculation (min) (ACUTE ONLY): 16 min   Charges:   PT Evaluation $PT Eval Low Complexity: 1 Low   PT General Charges $$ ACUTE PT VISIT: 1 Visit         Ozie Bo, PT, MPT   Erlene Hawks 08/05/2023, 1:18 PM

## 2023-08-05 NOTE — Progress Notes (Signed)
   Heart Failure Stewardship Pharmacist Progress Note   PCP: Verma Gobble, NP PCP-Cardiologist: None    HPI:  64 YOM with PMH of DVT/PE on Xarelto , chronic repiratory failure, HFmrEF, paroxysmal Afib, T2DM, HTN, OSA, prostate cancer, urinary retention with chronic suprapubic cath, CAD, pulmonary HTN. Recently underwent cardioversion for Afib 07/27/2023.  Uses O2 at home but has recently required continuous use with O2 levels dropping while on it, requiring increase from 2L to 4L. Noted weight gain of 14 pounds over previous week despite decreased appetite.  ECHO 07/04/2023 showed LVEF 40-45% (previously 45-50%), global hypokinesis. Some limitations of imaging due to Afib.   On physical exam, patient endorses SOB when moving, not at rest. Denies chest pain / tightness. Some bilateral lower extremity swelling present.   Current HF Medications: Diuretic: furosemide  40 mg IV BID Beta Blocker: metoprolol  tartrate 100 mg BID ACE/ARB/ARNI: losartan  25 mg daily Other: hydralazine  75 mg TID  Prior to admission HF Medications: Diuretic: furosemide  20 mg PRN - pt takes daily Beta blocker: metoprolol  tartrate 100 mg BID ACE/ARB/ARNI: losartan  50 mg daily  MRA: spironolactone  25 mg daily  SGLT2i: Jardiance  10 mg daily Other: hydralazine  75 mg TID  Pertinent Lab Values: Serum creatinine 1.39>1.53, BUN 22, Potassium 4.0, Sodium 145, BNP 994.8, A1c 5.5 (4/25)  Vital Signs: Weight: 307 lbs (admission weight: 319 lbs) Blood pressure: 140's/80's  Heart rate: 67  I/O: net -0.6L yesterday; net -3.6L since admission  Medication Assistance / Insurance Benefits Check: Does the patient have prescription insurance?  Yes Type of insurance plan: [employer]  Outpatient Pharmacy:  Prior to admission outpatient pharmacy: [facility]  Is the patient willing to use [facility] pharmacy at discharge? Yes Is the patient willing to transition their outpatient pharmacy to utilize a [facility]  outpatient pharmacy?   Yes    Assessment: 1. Acute on chronic systolic CHF (LVEF 40-45%), due to presumed NICM. NYHA class III symptoms. - Continue furosemide  40 mg IV BID - Continue metoprolol  tartrate 100 mg BID - Continue losartan  25 mg daily - Continue hydralazine  75 mg TID - Daily weight. Strict I/O's.  - Keep K > 4, Mg > 2    Plan: 1) Medication changes recommended at this time: - Consider switching losartan  25 mg to Entresto 24/26 mg BID - Consider starting spironolactone  12.5 mg daily  - Consider starting isosorbide mononitrate 30 mg daily  - Avoid SGLT2i in the setting of chronic catheter  2) Patient assistance: - Entresto co-pay $0 for 90 days  3)  Education  - Initial education provided.  - To be completed prior to discharge.  Winnie Haver, PharmD Candidate [phone] [facility] [employer] 08/05/2023 3:46 PM

## 2023-08-05 NOTE — TOC Initial Note (Signed)
 Transition of Care (TOC) - Initial/Assessment Note  Sherin Dingwall RN, BSN Transitions of Care Unit 4E- RN Case Manager See Treatment Team for direct phone #   Patient Details  Name: Thomas [name] MRN: [id number] Date of Birth: 09-Feb-1951  Transition of Care [facility]) CM/SW Contact:    Rox Cope, RN Phone Number: 08/05/2023, 2:36 PM  Clinical Narrative:                 Received msg from PT that pt/family had questions about home 02 setup.   CM went to room and spoke with pt and family (daughters at bedside) to discuss questions about home 36.  Per pt he was set up with home 02 in May on discharge- but when [employer] came to home to deliver concentrator pt refused and wanted to keep the [facility] (transport concentrator that he was sent home on). Pt reports that he runs out of 02 when he is out and about and going to appointments. Pt does not have home concentrator or E-tanks at home.  Explained to pt that he may not be suitable for the pulse dose that the portable concentrators deliver and needs to be evaluated for this type of delivery.  Pt also expressed that his cost was more per month than he was originally told.   Pt/daughters also had questions about standing scales, tub benches- questions answered as well as pt would like a rollator for home.   List provided for [facility] choice- CM will follow up for referral.   Call made to [employer] liaison Mitch to confirm home 02 needs- per their records Mitch confirmed pt declined concentrator and portable tanks- at this time [employer] will need new home 02 order as well as qualifying documentation in order to complete the home setup that pt needs. Pt will need the home 02 order to say in the comments- eval for POC so that they can follow up on appropriateness of POC use with pt in the home.  CM will follow up. Pt and family updated, [employer] to also follow up with pt.   Expected Discharge Plan: Home w Home Health Services Barriers to Discharge:  Continued Medical Work up   Patient Goals and CMS Choice Patient states their goals for this hospitalization and ongoing recovery are:: return home CMS Medicare.gov Compare Post Acute Care list provided to:: Patient Choice offered to / list presented to : Patient, Adult Children      Expected Discharge Plan and Services   Discharge Planning Services: CM Consult Post Acute Care Choice: Home Health Living arrangements for the past 2 months: Single Family Home                 DME Arranged: Walker rolling with seat DME Agency: [employer]                  Prior Living Arrangements/Services Living arrangements for the past 2 months: Single Family Home Lives with:: Self, Spouse Patient language and need for interpreter reviewed:: Yes Do you feel safe going back to the place where you live?: Yes      Need for Family Participation in Patient Care: Yes (Comment) Care giver support system in place?: Yes (comment) Current home services: DME (cane, RW, shower chair, home 02 -[employer]) Criminal Activity/Legal Involvement Pertinent to Current Situation/Hospitalization: No - Comment as needed  Activities of Daily Living   ADL Screening (condition at time of admission) Independently performs ADLs?: Yes (appropriate for developmental age) Is the patient deaf  or have difficulty hearing?: No Does the patient have difficulty seeing, even when wearing glasses/contacts?: Yes Does the patient have difficulty concentrating, remembering, or making decisions?: No  Permission Sought/Granted Permission sought to share information with : Facility Industrial/product designer granted to share information with : Yes, Verbal Permission Granted     Permission granted to share info w AGENCY: HH/DME        Emotional Assessment Appearance:: Appears stated age Attitude/Demeanor/Rapport: Engaged Affect (typically observed): Accepting Orientation: : Oriented to Self, Oriented to Place, Oriented  to Situation, Oriented to  Time Alcohol  / Substance Use: Not Applicable Psych Involvement: No (comment)  Admission diagnosis:  Acute systolic congestive heart failure (HCC) [I50.21] Heart failure with reduced ejection fraction (HCC) [I50.20] Patient Active Problem List   Diagnosis Date Noted   Heart failure with reduced ejection fraction (HCC) 08/03/2023   Acute on chronic respiratory failure with hypoxia (HCC) 08/03/2023   History of DVT (deep vein thrombosis) 08/03/2023   Normocytic anemia 08/03/2023   Presence of suprapubic catheter (HCC) 08/03/2023   Atrial fibrillation (HCC) 07/04/2023   Atrial fibrillation with RVR (HCC) 07/03/2023   presumed UTI 07/03/2023   Acute renal failure superimposed on stage 3b chronic kidney disease (HCC) 07/03/2023   CAD (coronary artery disease) 11/02/2022   Dyspnea 10/19/2017   Chronic respiratory failure with hypoxia (HCC) 10/19/2017   Chronic anticoagulation 07/27/2017   Pulmonary hypertension, unspecified (HCC)    Chest pain 06/01/2017   New onset atrial fibrillation (HCC) 06/01/2017   Benign hypertension with chronic kidney disease, stage III (HCC) 06/01/2017   Heart failure with mildly reduced ejection fraction (HFmrEF) (HCC) 06/01/2017   Paroxysmal atrial fibrillation (HCC) 05/31/2017   Total knee replacement status 11/19/2016   Meniscal injury 10/09/2016   Genetic testing 06/22/2016   DVT, lower extremity, proximal, acute, left (HCC) 01/23/2016   Hematuria 12/12/2015   Type 2 diabetes mellitus with chronic kidney disease, without long-term current use of insulin  (HCC) 12/05/2014   Essential hypertension 12/05/2014   Benign prostatic hyperplasia 07/25/2014   Urine retention 07/25/2014   Urinary tract infection 07/09/2014   Anemia, iron  deficiency 11/22/2013   Urinary retention 11/01/2013   Incomplete emptying of bladder 07/26/2013   Nocturia 07/26/2013   Other nonspecific finding on examination of urine 07/26/2013   Sleep apnea  07/26/2013   Urethral stricture 07/26/2013   Bladder neck contracture 07/04/2013   Severe obesity (BMI >= 40) (HCC) 05/23/2013   B12 deficiency 05/23/2013   Hyperlipidemia with target LDL less than 100 09/02/2012   HTN (hypertension) 05/26/2012   Osteoarthritis, knee 05/26/2012   MORBID OBESITY 08/22/2008   PCP:  Verma Gobble, NP Pharmacy:   [facility] [address], [address] [place] [address] Phone: [phone] Fax: [phone]  [facility] - [place], [place] - [address] [place] [address] Phone: [phone] Fax: [phone]     Social Drivers of Health (SDOH) Social History: SDOH Screenings   Food Insecurity: No Food Insecurity (08/04/2023)  Recent Concern: Food Insecurity - Food Insecurity Present (07/03/2023)  Housing: Low Risk  (08/04/2023)  Transportation Needs: No Transportation Needs (08/04/2023)  Utilities: Not At Risk (08/04/2023)  Depression (PHQ2-9): Low Risk  (08/02/2023)  Financial Resource Strain: Patient Declined (07/06/2023)  Physical Activity: Inactive (12/09/2017)  Social Connections: Moderately Isolated (08/04/2023)  Stress: No Stress Concern Present (12/09/2017)  Tobacco Use: Low Risk  (08/03/2023)   SDOH Interventions:     Readmission Risk  Interventions     No data to display

## 2023-08-05 NOTE — Progress Notes (Signed)
 Mobility Specialist Progress Note:    08/05/23 1046  Mobility  Activity Ambulated with assistance in room;Transferred from bed to chair;Transferred from chair to bed  Level of Assistance Minimal assist, patient does 75% or more  Assistive Device Front wheel walker  Distance Ambulated (ft) 60 ft  Activity Response Tolerated well  Mobility Referral Yes  Mobility visit 1 Mobility  Mobility Specialist Start Time (ACUTE ONLY) 1020  Mobility Specialist Stop Time (ACUTE ONLY) 1036  Mobility Specialist Time Calculation (min) (ACUTE ONLY) 16 min   Pt received in bed, agreeable to mobility session. Required MinA to stand. SpO2 97% on 4L at rest. Pt declined ambulating in hallway, agreeable to ambulate in room to monitor O2. Pt had audible SOB throughout session, SpO2 81% on 6L, practiced pursed lip breathing and increased O2 flow. Pt encouraged to sit in chair to recover, increased O2 flow, SpO2 90% on  8L. After a few minutes, MS reduced O2 flow, SpO2 94% on 4L. Returned to bed, SpO2 97% on 4L. Lying comfortably in bed with all needs met, call bell in reach.   Tejah Brekke Mobility Specialist Please contact via Special educational needs teacher or  Rehab office at [phone]

## 2023-08-06 DIAGNOSIS — I5020 Unspecified systolic (congestive) heart failure: Secondary | ICD-10-CM | POA: Diagnosis not present

## 2023-08-06 LAB — BASIC METABOLIC PANEL WITH GFR
Anion gap: 10 (ref 5–15)
BUN: 31 mg/dL — ABNORMAL HIGH (ref 8–23)
CO2: 28 mmol/L (ref 22–32)
Calcium: 8.1 mg/dL — ABNORMAL LOW (ref 8.9–10.3)
Chloride: 105 mmol/L (ref 98–111)
Creatinine, Ser: 1.64 mg/dL — ABNORMAL HIGH (ref 0.61–1.24)
GFR, Estimated: 44 mL/min — ABNORMAL LOW (ref >60)
Glucose, Bld: 155 mg/dL — ABNORMAL HIGH (ref 70–99)
Potassium: 4 mmol/L (ref 3.5–5.1)
Sodium: 143 mmol/L (ref 135–145)

## 2023-08-06 LAB — CBC
HCT: 36.7 % — ABNORMAL LOW (ref 39.0–52.0)
Hemoglobin: 11.5 g/dL — ABNORMAL LOW (ref 13.0–17.0)
MCH: 28.8 pg (ref 26.0–34.0)
MCHC: 31.3 g/dL (ref 30.0–36.0)
MCV: 92 fL (ref 80.0–100.0)
Platelets: 253 10*3/uL (ref 150–400)
RBC: 3.99 MIL/uL — ABNORMAL LOW (ref 4.22–5.81)
RDW: 14.9 % (ref 11.5–15.5)
WBC: 8.3 10*3/uL (ref 4.0–10.5)
nRBC: 0 % (ref 0.0–0.2)

## 2023-08-06 LAB — MAGNESIUM: Magnesium: 2.2 mg/dL (ref 1.7–2.4)

## 2023-08-06 MED ORDER — FUROSEMIDE 40 MG PO TABS
40.0000 mg | ORAL_TABLET | Freq: Every day | ORAL | Status: DC
  Administered 2023-08-07: 40 mg via ORAL
  Filled 2023-08-06: qty 1

## 2023-08-06 MED ORDER — FUROSEMIDE 40 MG PO TABS: 40.0000 mg | ORAL_TABLET | Freq: Every day | ORAL | Status: DC

## 2023-08-06 MED ORDER — ISOSORBIDE MONONITRATE ER 30 MG PO TB24
30.0000 mg | ORAL_TABLET | Freq: Every day | ORAL | Status: DC
  Administered 2023-08-06 – 2023-08-07 (×2): 30 mg via ORAL
  Filled 2023-08-06 (×2): qty 1

## 2023-08-06 MED ORDER — RIVAROXABAN 20 MG PO TABS
20.0000 mg | ORAL_TABLET | Freq: Every day | ORAL | Status: DC
  Administered 2023-08-06: 20 mg via ORAL
  Filled 2023-08-06: qty 1

## 2023-08-06 MED ORDER — DIGOXIN 0.25 MG/ML IJ SOLN
0.1250 mg | Freq: Four times a day (QID) | INTRAMUSCULAR | Status: AC
  Administered 2023-08-06 – 2023-08-07 (×2): 0.125 mg via INTRAVENOUS
  Filled 2023-08-06 (×2): qty 0.5

## 2023-08-06 NOTE — Plan of Care (Signed)

## 2023-08-06 NOTE — Progress Notes (Addendum)
 PROGRESS NOTE    Thomas [name]  FMW:[id number] DOB: September 21, 1950 DOA: 08/03/2023 PCP: [name], NP   73/M w chronic respiratory failure on 3 L home O2, OSA OHS, pulmonary hypertension, DVT/PE on Xarelto , systolic CHF, paroxysmal A-fib, type 2 diabetes mellitus, prostate cancer, urinary retention, chronic suprapubic catheter presented to the ED with shortness of breath and recent weight gain.  Recently underwent cardioversion on 6/10 followed by mild improvement however symptoms worsened with progressive dyspnea, fatigue, worsening hypoxia.  Compliant with diuretics and diet He was at the urology office 6/17 had his suprapubic catheter exchange when he was noted to be hypoxic in the 80s despite being on his 4 L of oxygen .  He was transferred to the ED for further evaluation.  In the ED hypertensive, hypoxic, BNP 994, troponin 21, creatinine 1.6, chest x-ray with cardiomegaly, pulmonary vascular congestion   Subjective: - Better today, hematuria has cleared, breathing is improving  Assessment and Plan:  Acute on chronic respiratory failure  Acute on chronic combined CHF  - History of progressive dyspnea, 14 pound weight gain despite Mounjaro  -last echo 5/25 with EF 40-45% and mod reducedRV -Recent cardioversion 6/10, currently in sinus rhythm, suspect volume overload likely triggered by recent prolonged A-fib - Improving with diuresis, 6.3 L negative, appears close to euvolemic, transition to oral Lasix  tomorrow, continue losartan , metoprolol , hydralazine  -Poor candidate for SGLT2i with suprapubic catheter -Wean O2, increase activity  Hematuria Klebsiella UTI -Had some hematuria yesterday a.m. which has improved - Held Xarelto  yesterday -Suprapubic catheter was changed just prior to admission - Symptoms improved, resume Xarelto  tonight, continue ceftriaxone , changed to oral Keflex  tomorrow  Essential hypertension Improving - Continue hydralazine , metoprolol , losartan     History of DVT/pulmonary embolism on chronic anticoagulation - Holding Xarelto , see discussion above   Paroxysmal atrial fibrillation on chronic anticoagulation Patient underwent cardioversion on 6/10.  Currently appears to be in a sinus rhythm. - Continue metoprolol , restarting Xarelto    Diabetes mellitus type 2, without long-term use of insulin  Last available hemoglobin A1c was 5.5 when checked on 05/24/2023. - Held metformin ,  Mounjaro  -Poor candidate for Jardiance    Normocytic anemia Stay well, monitor   Chronic kidney disease stage IIIb Creatinine noted to be 1.6.  Baseline creatinine appears to range from 1.5-1.7. - Continue to monitor kidney function with diuresis   Urinary retention s/p suprapubic catheter Patient had suprapubic catheter replaced by urology prior to arrival.  6/18   Morbid obesity BMI 47.21 kg/m. - Continue Mounjaro  in outpatient setting   OSA - Continue CPAP at night   DVT prophylaxis: Xarelto  Advance Care Planning:   Code Status: Full Code   Family Communication: None present Dispo: Tomorrow   Objective: Vitals:   08/05/23 2332 08/06/23 0341 08/06/23 0643 08/06/23 0818  BP: (!) 146/71 (!) 134/58  (!) 151/105  Pulse: 70 63  68  Resp: 20 19 20    Temp: 97.7 F (36.5 C) 97.9 F (36.6 C)  97.9 F (36.6 C)  TempSrc: Oral Axillary  Oral  SpO2: 98% 96%  97%  Weight:   (!) 136.8 kg   Height:        Intake/Output Summary (Last 24 hours) at 08/06/2023 1141 Last data filed at 08/06/2023 1020 Gross per 24 hour  Intake --  Output 2825 ml  Net -2825 ml   Filed Weights   08/03/23 1117 08/04/23 0521 08/06/23 0643  Weight: (!) 145 kg (!) 139.4 kg (!) 136.8 kg    Examination:  General exam: Obese  chronically ill male laying in bed, AAO x 3 HEENT: Neck is unable to assess JVD CVS: S1-S2, regular rhythm Lungs: Clear Abdomen: Soft, nontender, bowel sounds present, suprapubic catheter  extremities: Trace edema  Skin: No rashes Psychiatry:   Mood & affect appropriate.     Data Reviewed:   CBC: Recent Labs  Lab 08/02/23 1109 08/03/23 1238 08/04/23 0137 08/05/23 0918 08/06/23 0307  WBC 9.1 8.8 8.6 7.4 8.3  NEUTROABS 7,680 7.3  --   --   --   HGB 12.3* 12.3* 12.1* 11.9* 11.5*  HCT 39.6 39.4 38.6* 38.6* 36.7*  MCV 92.1 92.1 91.7 92.8 92.0  PLT 283 257 254 237 253   Basic Metabolic Panel: Recent Labs  Lab 08/02/23 1109 08/03/23 1238 08/04/23 0811 08/05/23 0918 08/06/23 0307  NA 144 144 144 145 143  K 4.4 4.2 3.8 4.0 4.0  CL 109 109 107 106 105  CO2 24 24 25 22 28   GLUCOSE 113* 102* 111* 86 155*  BUN 26* 25* 22 27* 31*  CREATININE 1.49* 1.60* 1.39* 1.53* 1.64*  CALCIUM  9.3 9.1 9.0 8.7* 8.1*   GFR: Estimated Creatinine Clearance: 55.1 mL/min (A) (by C-G formula based on SCr of 1.64 mg/dL (H)). Liver Function Tests: Recent Labs  Lab 08/02/23 1109 08/03/23 1238  AST 15 25  ALT 34 38  ALKPHOS  --  56  BILITOT 1.0 1.7*  PROT 6.8 7.3  ALBUMIN  --  3.7   No results for input(s): LIPASE, AMYLASE in the last 168 hours. No results for input(s): AMMONIA in the last 168 hours. Coagulation Profile: No results for input(s): INR, PROTIME in the last 168 hours. Cardiac Enzymes: No results for input(s): CKTOTAL, CKMB, CKMBINDEX, TROPONINI in the last 168 hours. BNP (last 3 results) Recent Labs    11/03/22 1000  PROBNP 659*   HbA1C: No results for input(s): HGBA1C in the last 72 hours. CBG: No results for input(s): GLUCAP in the last 168 hours. Lipid Profile: No results for input(s): CHOL, HDL, LDLCALC, TRIG, CHOLHDL, LDLDIRECT in the last 72 hours. Thyroid  Function Tests: No results for input(s): TSH, T4TOTAL, FREET4, T3FREE, THYROIDAB in the last 72 hours. Anemia Panel: No results for input(s): VITAMINB12, FOLATE, FERRITIN, TIBC, IRON , RETICCTPCT in the last 72 hours. Urine analysis:    Component Value Date/Time   COLORURINE YELLOW 08/03/2023  1634   APPEARANCEUR HAZY (A) 08/03/2023 1634   LABSPEC 1.010 08/03/2023 1634   PHURINE 6.0 08/03/2023 1634   GLUCOSEU >=500 (A) 08/03/2023 1634   HGBUR MODERATE (A) 08/03/2023 1634   BILIRUBINUR NEGATIVE 08/03/2023 1634   BILIRUBINUR Negatuve 09/15/2019 1340   KETONESUR NEGATIVE 08/03/2023 1634   PROTEINUR 100 (A) 08/03/2023 1634   UROBILINOGEN 0.2 09/15/2019 1340   UROBILINOGEN 0.2 05/19/2014 1412   NITRITE NEGATIVE 08/03/2023 1634   LEUKOCYTESUR LARGE (A) 08/03/2023 1634   Sepsis Labs: @LABRCNTIP (procalcitonin:4,lacticidven:4)  ) Recent Results (from the past 240 hours)  Resp panel by RT-PCR (RSV, Flu A&B, Covid) Anterior Nasal Swab     Status: None   Collection Time: 08/03/23  4:20 PM   Specimen: Anterior Nasal Swab  Result Value Ref Range Status   SARS Coronavirus 2 by RT PCR NEGATIVE NEGATIVE Final   Influenza A by PCR NEGATIVE NEGATIVE Final   Influenza B by PCR NEGATIVE NEGATIVE Final    Comment: (NOTE) The Xpert Xpress SARS-CoV-2/FLU/RSV plus assay is intended as an aid in the diagnosis of influenza from Nasopharyngeal swab specimens and should not be used as a  sole basis for treatment. Nasal washings and aspirates are unacceptable for Xpert Xpress SARS-CoV-2/FLU/RSV testing.  Fact Sheet for Patients: BloggerCourse.com  Fact Sheet for Healthcare Providers: SeriousBroker.it  This test is not yet approved or cleared by the [place]  FDA and has been authorized for detection and/or diagnosis of SARS-CoV-2 by FDA under an Emergency Use Authorization (EUA). This EUA will remain in effect (meaning this test can be used) for the duration of the COVID-19 declaration under Section 564(b)(1) of the Act, 21 U.S.C. section 360bbb-3(b)(1), unless the authorization is terminated or revoked.     Resp Syncytial Virus by PCR NEGATIVE NEGATIVE Final    Comment: (NOTE) Fact Sheet for  Patients: BloggerCourse.com  Fact Sheet for Healthcare Providers: SeriousBroker.it  This test is not yet approved or cleared by the [place]  FDA and has been authorized for detection and/or diagnosis of SARS-CoV-2 by FDA under an Emergency Use Authorization (EUA). This EUA will remain in effect (meaning this test can be used) for the duration of the COVID-19 declaration under Section 564(b)(1) of the Act, 21 U.S.C. section 360bbb-3(b)(1), unless the authorization is terminated or revoked.  Performed at [facility], [address], [place], [place] [address]   Urine Culture     Status: Abnormal   Collection Time: 08/03/23  4:34 PM   Specimen: Urine, Catheterized  Result Value Ref Range Status   Specimen Description URINE, CATHETERIZED  Final   Special Requests   Final    NONE Reflexed from [id number] Performed at [facility], [address], [place], [place] [address]    Culture >=100,000 COLONIES/mL KLEBSIELLA PNEUMONIAE (A)  Final   Report Status 08/05/2023 FINAL  Final   Organism ID, Bacteria KLEBSIELLA PNEUMONIAE (A)  Final      Susceptibility   Klebsiella pneumoniae - MIC*    AMPICILLIN RESISTANT Resistant     CEFAZOLIN  <=4 SENSITIVE Sensitive     CEFEPIME <=0.12 SENSITIVE Sensitive     CEFTRIAXONE  <=0.25 SENSITIVE Sensitive     CIPROFLOXACIN  <=0.25 SENSITIVE Sensitive     GENTAMICIN  <=1 SENSITIVE Sensitive     IMIPENEM 1 SENSITIVE Sensitive     NITROFURANTOIN  128 RESISTANT Resistant     TRIMETH /SULFA  <=20 SENSITIVE Sensitive     AMPICILLIN/SULBACTAM 4 SENSITIVE Sensitive     PIP/TAZO <=4 SENSITIVE Sensitive ug/mL    * >=100,000 COLONIES/mL KLEBSIELLA PNEUMONIAE     Radiology Studies: No results found.    Scheduled Meds:  atorvastatin   40 mg Oral Daily   furosemide   40 mg Intravenous BID   hydrALAZINE   75 mg Oral TID   losartan   25 mg Oral q AM   metoprolol  tartrate  100 mg Oral BID    rivaroxaban   20 mg Oral Q supper   Continuous Infusions:  cefTRIAXone  (ROCEPHIN )  IV 1 g (08/06/23 0401)     LOS: 3 days    Time spent:    Sigurd Pac, MD [facility]   08/06/2023, 11:41 AM

## 2023-08-06 NOTE — TOC CM/SW Note (Signed)
 Per Mobility specialist documentation:   Patient Saturations on Room Air at Rest = spO2 84%   Patient Saturations on 4 Liters of oxygen  while Ambulating = sp02 92%   At end of testing pt left in room on 3.5  Liters of oxygen .

## 2023-08-06 NOTE — Progress Notes (Cosign Needed)
   Heart Failure Stewardship Pharmacist Progress Note   PCP: Verma Gobble, NP PCP-Cardiologist: None    HPI:  39 YOM with PMH of DVT/PE on Xarelto , chronic repiratory failure, HFmrEF, paroxysmal Afib, T2DM, HTN, OSA, prostate cancer, urinary retention with chronic suprapubic cath, CAD, pulmonary HTN. Recently underwent cardioversion for Afib 07/27/2023.  Uses O2 at home but has recently required continuous use with O2 levels dropping while on it, requiring increase from 2L to 4L. Noted weight gain of 14 pounds over previous week despite decreased appetite.  ECHO 07/04/2023 showed LVEF 40-45% (previously 45-50%), global hypokinesis. Some limitations of imaging due to Afib.   On exam, patient endorses some SOB while moving but states he is comfortable at rest. Bilateral lower extremity swelling present. States he is overall feeling better.   Current HF Medications: Diuretic: furosemide  40 mg PO daily Beta Blocker: metoprolol  tartrate 100 mg BID ACE/ARB/ARNI: losartan  25 mg daily Other: hydralazine  75 mg TID  Prior to admission HF Medications: Diuretic: furosemide  20 mg PRN - pt takes daily Beta blocker: metoprolol  tartrate 100 mg BID ACE/ARB/ARNI: losartan  50 mg daily  MRA: spironolactone  25 mg daily  SGLT2i: Jardiance  10 mg daily Other: hydralazine  75 mg TID  Pertinent Lab Values: Serum creatinine 1.39>1.53>1.64, BUN 31, Potassium 4.0, Sodium 143, BNP 994.8, A1c 5.5 (4/25)  Vital Signs: Weight: 307 lbs (admission weight: 319 lbs) Blood pressure: 130-140's/80's  Heart rate: 70  I/O: net -2L yesterday; net -6.3L since admission  Medication Assistance / Insurance Benefits Check: Does the patient have prescription insurance?  Yes Type of insurance plan: [employer]  Outpatient Pharmacy:  Prior to admission outpatient pharmacy: [facility]  Is the patient willing to use [facility] pharmacy at discharge? Yes Is the patient willing to transition their outpatient pharmacy  to utilize a [facility] outpatient pharmacy?   Yes    Assessment: 1. Acute on chronic systolic CHF (LVEF 40-45%), due to presumed NICM. NYHA class III symptoms. - Continue furosemide  40 mg PO daily - Continue metoprolol  tartrate 100 mg BID - Continue losartan  25 mg daily - Continue hydralazine  75 mg TID - Daily weight. Strict I/O's.  - Keep K > 4, Mg > 2    Plan: 1) Medication changes recommended at this time: - Consider switching losartan  25 mg to Entresto 24/26 mg BID - as Scr can tolerate - Consider starting spironolactone  12.5 mg daily - as Scr can tolerate - Consider starting isosorbide mononitrate 30 mg daily  - Avoid SGLT2i in the setting of chronic catheter  2) Patient assistance: - Entresto co-pay $0 for 90 days  3)  Education  - Initial education provided.  - To be completed prior to discharge.  Winnie Haver, PharmD Candidate [phone] [facility] [employer] 08/06/2023 1:54 PM

## 2023-08-06 NOTE — Progress Notes (Addendum)
 Mobility Specialist: Progress Note   08/06/23 1147  Mobility  Activity Ambulated with assistance in room  Level of Assistance Standby assist, set-up cues, supervision of patient - no hands on  Assistive Device Front wheel walker  Distance Ambulated (ft) 75 ft  Activity Response Tolerated well  Mobility Referral Yes  Mobility visit 1 Mobility  Mobility Specialist Start Time (ACUTE ONLY) 1125  Mobility Specialist Stop Time (ACUTE ONLY) 1145  Mobility Specialist Time Calculation (min) (ACUTE ONLY) 20 min    Pre Mobility: SpO2 84-85% RA, 96% 3.5LO2 During Mobility: SpO2 87-95% 3LO2, 92-93% 4LO2 Post Mobility: SpO2 95% 3.5LO2  Pt received in bed, agreeable to mobility session. Wife at bedside. Pt completed ambulatory sat test (see following note). SV throughout. Pt on 3.5LO2 upon entry, SpO2 96%. Pt declined hallway ambulation and opted to do laps around the room. 1x occurrence of a-fib at EOB where pt's HR increased to 145-147, but returned within normal range (69-75 bpm) with rest. Ambulated 50' in room on 3LO2, SpO2 92-95%. However, pt desat during last lap of ambulation to 87% on 3LO2, pt titrated to 4L and SpO2 recovered WFL. Pt also c/o bil leg weakness, fatigue, and was audibly SOB upon desat. Requested to sit up for a little while. Left in chair with all needs met, call bell in reach. Wife present.   Deloria Fetch Mobility Specialist Please contact via SecureChat or Rehab office at [phone]

## 2023-08-06 NOTE — Progress Notes (Signed)
 Heart Failure Navigator Progress Note  Assessed for Heart & Vascular TOC clinic readiness.  Patient does not meet criteria due to EF 40-45%, Follow up with [facility], No HF TOC per Dr. Drexel Gentles. .   Navigator will sign off at this time.   Randie Bustle, BSN, Scientist, clinical (histocompatibility and immunogenetics) Only

## 2023-08-06 NOTE — Progress Notes (Signed)
 Nurse requested Mobility Specialist to perform oxygen  saturation test with pt which includes removing pt from oxygen  both at rest and while ambulating.  Below are the results from that testing.     Patient Saturations on Room Air at Rest = spO2 84%  Patient Saturations on 4 Liters of oxygen  while Ambulating = sp02 92%  At end of testing pt left in room on 3.5  Liters of oxygen .  Reported results to nurse.

## 2023-08-07 ENCOUNTER — Telehealth: Payer: Self-pay | Admitting: Cardiology

## 2023-08-07 ENCOUNTER — Other Ambulatory Visit (HOSPITAL_COMMUNITY): Payer: Self-pay

## 2023-08-07 DIAGNOSIS — I5021 Acute systolic (congestive) heart failure: Secondary | ICD-10-CM | POA: Diagnosis not present

## 2023-08-07 DIAGNOSIS — I4819 Other persistent atrial fibrillation: Secondary | ICD-10-CM | POA: Diagnosis not present

## 2023-08-07 DIAGNOSIS — Z7901 Long term (current) use of anticoagulants: Secondary | ICD-10-CM | POA: Diagnosis not present

## 2023-08-07 DIAGNOSIS — I10 Essential (primary) hypertension: Secondary | ICD-10-CM | POA: Diagnosis not present

## 2023-08-07 DIAGNOSIS — I5020 Unspecified systolic (congestive) heart failure: Secondary | ICD-10-CM | POA: Diagnosis not present

## 2023-08-07 LAB — BASIC METABOLIC PANEL WITH GFR
Anion gap: 12 (ref 5–15)
BUN: 29 mg/dL — ABNORMAL HIGH (ref 8–23)
CO2: 24 mmol/L (ref 22–32)
Calcium: 8.2 mg/dL — ABNORMAL LOW (ref 8.9–10.3)
Chloride: 108 mmol/L (ref 98–111)
Creatinine, Ser: 1.56 mg/dL — ABNORMAL HIGH (ref 0.61–1.24)
GFR, Estimated: 47 mL/min — ABNORMAL LOW (ref >60)
Glucose, Bld: 168 mg/dL — ABNORMAL HIGH (ref 70–99)
Potassium: 3.9 mmol/L (ref 3.5–5.1)
Sodium: 144 mmol/L (ref 135–145)

## 2023-08-07 MED ORDER — ISOSORBIDE MONONITRATE ER 30 MG PO TB24
30.0000 mg | ORAL_TABLET | Freq: Every day | ORAL | 1 refills | Status: DC
  Filled 2023-08-07: qty 30, 30d supply, fill #0
  Filled 2023-09-07: qty 30, 30d supply, fill #1

## 2023-08-07 MED ORDER — FUROSEMIDE 20 MG PO TABS
40.0000 mg | ORAL_TABLET | Freq: Every day | ORAL | 1 refills | Status: DC
  Filled 2023-08-07: qty 30, 15d supply, fill #0

## 2023-08-07 MED ORDER — LOSARTAN POTASSIUM 50 MG PO TABS
25.0000 mg | ORAL_TABLET | Freq: Every day | ORAL | Status: DC
Start: 2023-08-07 — End: 2023-08-23

## 2023-08-07 MED ORDER — CEPHALEXIN 250 MG PO CAPS
250.0000 mg | ORAL_CAPSULE | Freq: Three times a day (TID) | ORAL | 0 refills | Status: AC
  Filled 2023-08-07: qty 6, 2d supply, fill #0

## 2023-08-07 NOTE — TOC CM/SW Note (Signed)
 PT is recommending HH PT. Discussed with pt and wife HH and preference for a [facility] agency. They report they don't have a preference for a HH agency. Discussed CMS Medicare.gov compare list. She chose [facility]. Contacted Joane with [facility] for referral. He accepted the referral.

## 2023-08-07 NOTE — Consult Note (Signed)
 Cardiology Consultation   Patient ID: [name] MRN: [id number]; DOB: 03-07-1950  Admit date: 08/03/2023 Date of Consult: 08/07/2023  PCP:  Thomas Harlene POUR, NP   [name] Providers Cardiologist:  None  Cardiology APP:  Thomas Thomas DASEN, PA-C       Patient Profile: [name] is a 73 y.o. male with a hx of Urinary who is being seen 08/07/2023 for the evaluation of Afib and HF at the request of  Persistent atrial fibrillation, heart failure with mildly reduced LVEF, DVT/PE 2018, hypertension, diabetes mellitus type 2, nonobstructive CAD, OSA, history of prostate cancer/urinary retention/suprapubic catheter.  History of Present Illness: [name] with a known complex past medical history as outlined above presented to the hospital with symptoms consistent with clinical heart failure.  He was admitted to medicine service and underwent diuresis.  Cardiology consulted during this hospitalization predominately for A-fib management.  At the time of evaluation patient is accompanied by his younger daughter at bedside and his wife is present over the phone on speaker.  Patient recently had gone to his urologist office for exchange of suprapubic catheter and was noted to be short of breath and hypoxic and sent to the ED for further evaluation and management.  Patient has undergone IV diuresis with a net negative urine output of at least 8 L based on strict I's and O's.  Clinically he denies orthopnea, PND.  Lower extremity swelling has significantly improved.  He still has shortness of breath with effort related activities that is chronic and stable and mostly back to baseline.  Patient was in the process of being discharged this morning.  But reverted back to atrial fibrillation as of 08/06/2023 as per telemetry.  His rates are very well-controlled.  He is very well compliant with medical therapy.  He informs me that he had cardioversion several years ago which  helped maintain sinus rhythm.  However, his recent cardioversion was done on 6 11/2023 and now he is reverted back to A-fib..  Otherwise patient is stable and wishes to go home.  Past Medical History:  Diagnosis Date   Acute on chronic diastolic CHF (congestive heart failure) (HCC) 06/01/2017   Arthritis    Cancer (HCC) 2010   Prostate   Chronic kidney disease    Diabetes mellitus    Diabetic neuropathy (HCC)    feet   DVT (deep venous thrombosis) (HCC)    Genetic testing 06/22/2016   [name] underwent genetic counseling and testing for hereditary cancer syndromes on 05/14/2016. His results were negative for mutations in all 46 genes analyzed by [facility]'s 46-gene Common Hereditary Cancers Panel. Genes analyzed include: APC, ATM, AXIN2, BARD1, BMPR1A, BRCA1, BRCA2, BRIP1, CDH1, CDKN2A, CHEK2, CTNNA1, DICER1, EPCAM, GREM1, HOXB13, KIT, MEN1, MLH1, MSH2, MSH3, MSH6, MUTYH, NB   GERD (gastroesophageal reflux disease)    Hypertension    PE (pulmonary thromboembolism) (HCC)    Pneumonia    Sleep apnea    not wearing CPAP   Suprapubic catheter (HCC) 11/17/2019    Past Surgical History:  Procedure Laterality Date   CARDIOVERSION N/A 06/03/2017   Procedure: CARDIOVERSION;  Surgeon: Thomas Oneil BROCKS, MD;  Location: [name];  Service: Cardiovascular;  Laterality: N/A;   CARDIOVERSION N/A 07/27/2023   Procedure: CARDIOVERSION;  Surgeon: Thomas Maude BROCKS, MD;  Location: [name];  Service: Cardiovascular;  Laterality: N/A;   CATARACT EXTRACTION Left 07/31/2021   Dr.Glenn, [name]   COLONOSCOPY  COLONOSCOPY WITH PROPOFOL  N/A 10/20/2018   Procedure: COLONOSCOPY WITH PROPOFOL ;  Surgeon: Thomas Iha, MD;  Location: [name];  Service: Gastroenterology;  Laterality: N/A;   HERNIA REPAIR     KNEE SURGERY     MULTIPLE TOOTH EXTRACTIONS     POLYPECTOMY  10/20/2018   Procedure: POLYPECTOMY;  Surgeon: Thomas Iha, MD;  Location: [name];  Service:  Gastroenterology;;   PROSTATE SURGERY     RADIOACTIVE SEED IMPLANT     RIGHT/LEFT HEART CATH AND CORONARY ANGIOGRAPHY N/A 07/06/2017   Procedure: RIGHT/LEFT HEART CATH AND CORONARY ANGIOGRAPHY;  Surgeon: Thomas Candyce RAMAN, MD;  Location: [name];  Service: Cardiovascular;  Laterality: N/A;   SHOULDER SURGERY     TOTAL KNEE ARTHROPLASTY Right 11/19/2016   Procedure: RIGHT TOTAL KNEE ARTHROPLASTY;  Surgeon: Thomas Kay HERO, MD;  Location: [name];  Service: Orthopedics;  Laterality: Right;   uretha surgery-2014         Scheduled Meds:  atorvastatin   40 mg Oral Daily   furosemide   40 mg Oral Daily   hydrALAZINE   75 mg Oral TID   isosorbide  mononitrate  30 mg Oral Daily   losartan   25 mg Oral q AM   metoprolol  tartrate  100 mg Oral BID   rivaroxaban   20 mg Oral Q supper   Continuous Infusions:  cefTRIAXone  (ROCEPHIN )  IV 1 g (08/07/23 0121)   PRN Meds: acetaminophen , ondansetron  (ZOFRAN ) IV, senna  Allergies:    Allergies  Allergen Reactions   Lisinopril  Cough and Other (See Comments)    Muscle pain    Social History:   Social History   Socioeconomic History   Marital status: Married    Spouse name: [name]   Number of children: Not on file   Years of education: Not on file   Highest education level: 12th grade  Occupational History   Not on file  Tobacco Use   Smoking status: Never    Passive exposure: Never   Smokeless tobacco: Never  Vaping Use   Vaping status: Never Used  Substance and Sexual Activity   Alcohol  use: No    Comment: never   Drug use: No   Sexual activity: Yes  Other Topics Concern   Not on file  Social History Narrative   Not on file   Social Drivers of Health   Financial Resource Strain: Patient Declined (07/06/2023)   Overall Financial Resource Strain (CARDIA)    Difficulty of Paying Living Expenses: Patient declined  Food Insecurity: No Food Insecurity (08/04/2023)   Hunger Vital Sign    Worried About Running Out of Food  in the Last Year: Never true    Ran Out of Food in the Last Year: Never true  Recent Concern: Food Insecurity - Food Insecurity Present (07/03/2023)   Hunger Vital Sign    Worried About Running Out of Food in the Last Year: Sometimes true    Ran Out of Food in the Last Year: Sometimes true  Transportation Needs: No Transportation Needs (08/04/2023)   PRAPARE - Administrator, Civil Service (Medical): No    Lack of Transportation (Non-Medical): No  Physical Activity: Inactive (12/09/2017)   Exercise Vital Sign    Days of Exercise per Week: 0 days    Minutes of Exercise per Session: 0 min  Stress: No Stress Concern Present (12/09/2017)   [employer] of Occupational Health - Occupational Stress Questionnaire    Feeling of Stress : Only a little  Social Connections:  Moderately Isolated (08/04/2023)   Social Connection and Isolation Panel    Frequency of Communication with Friends and Family: More than three times a week    Frequency of Social Gatherings with Friends and Family: Three times a week    Attends Religious Services: Never    Active Member of Clubs [name] Organizations: No    Attends Banker Meetings: Never    Marital Status: Married  Catering manager Violence: Not At Risk (08/04/2023)   Humiliation, Afraid, Rape, and Kick questionnaire    Fear of Current [name] Ex-Partner: No    Emotionally Abused: No    Physically Abused: No    Sexually Abused: No    Family History:   Family History  Problem Relation Age of Onset   Breast cancer Mother 55       d.89   Breast cancer Sister 30       d.30   Leukemia Brother 18       d.20   Breast cancer Maternal Aunt 40       d.40s   Lung cancer Maternal Uncle    Prostate cancer Paternal Uncle    Prostate cancer Brother        recurred recently at age 41   Other Brother 15       spinal tumor   Cervical cancer Other 22       d.22   Cancer Sister 61       unspecified type   Cancer Maternal Uncle 80        unspecified type     ROS:  Review of Systems  Cardiovascular:  Positive for dyspnea on exertion. Negative for chest pain, claudication, irregular heartbeat, leg swelling, near-syncope, orthopnea, palpitations, paroxysmal nocturnal dyspnea and syncope.  Respiratory:  Negative for shortness of breath.   Hematologic/Lymphatic: Negative for bleeding problem.   Physical Exam/Data: Vitals:   08/06/23 2321 08/07/23 0357 08/07/23 0405 08/07/23 0843  BP: 124/79 129/86  125/70  Pulse: 87 87    Resp: 20 20 14    Temp: 98.3 F (36.8 C) 97.8 F (36.6 C)  97.8 F (36.6 C)  TempSrc: Oral Oral  Oral  SpO2: 96% 96%  96%  Weight:   (!) 136.8 kg   Height:        Intake/Output Summary (Last 24 hours) at 08/07/2023 0908 Last data filed at 08/06/2023 1522 Gross per 24 hour  Intake --  Output 2100 ml  Net -2100 ml   Net IO Since Admission: -8,440 mL [08/07/23 0908]     08/07/2023    4:05 AM 08/06/2023    6:43 AM 08/04/2023    5:21 AM  Last 3 Weights  Weight (lbs) 301 lb 9.4 oz 301 lb 9.4 oz 307 lb 5.1 oz  Weight (kg) 136.8 kg 136.8 kg 139.4 kg     Body mass index is 44.54 kg/m.  General:  Well nourished, well developed, in no acute distress HEENT: normal, on nasal cannula oxygen  Neck: no JVD Vascular: No carotid bruits Cardiac: Irregularly irregular, variable S1-S2, no murmurs rubs [name] gallops appreciated Lungs:  clear to auscultation bilaterally, no wheezing, rhonchi [name] rales  Abd: soft, nontender, no hepatomegaly, suprapubic catheter Ext: no edema, warm to touch Musculoskeletal:  No deformities, BUE and BLE strength normal and equal Skin: warm and dry  Psych:  Normal affect   EKG:  The EKG was personally reviewed and demonstrates: 08/06/2023: Afib w/ RVR, PVCs, RAD, non specific ST-T changes.   Telemetry:  Telemetry was  personally reviewed and demonstrates:  Afib w/ cVR   Relevant CV Studies: Cardioversions July 27, 2023 300 J x 1  Laboratory Data: High Sensitivity Troponin:    Recent Labs  [name] 08/03/23 1238 08/03/23 1624  TROPONINIHS 21* 17     Chemistry Recent Labs  [name] 08/04/23 0811 08/05/23 0918 08/06/23 0307 08/06/23 1118  NA 144 145 143  --   K 3.8 4.0 4.0  --   CL 107 106 105  --   CO2 25 22 28   --   GLUCOSE 111* 86 155*  --   BUN 22 27* 31*  --   CREATININE 1.39* 1.53* 1.64*  --   CALCIUM  9.0 8.7* 8.1*  --   MG  --   --   --  2.2  GFRNONAA 54* 48* 44*  --   ANIONGAP 12 17* 10  --     Recent Labs  [name] 08/02/23 1109 08/03/23 1238  PROT 6.8 7.3  ALBUMIN  --  3.7  AST 15 25  ALT 34 38  ALKPHOS  --  56  BILITOT 1.0 1.7*   Lipids No results for input(s): CHOL, TRIG, HDL, LABVLDL, LDLCALC, CHOLHDL in the last 168 hours.  Hematology Recent Labs  [name] 08/04/23 0137 08/05/23 0918 08/06/23 0307  WBC 8.6 7.4 8.3  RBC 4.21* 4.16* 3.99*  HGB 12.1* 11.9* 11.5*  HCT 38.6* 38.6* 36.7*  MCV 91.7 92.8 92.0  MCH 28.7 28.6 28.8  MCHC 31.3 30.8 31.3  RDW 15.0 15.1 14.9  PLT 254 237 253   Thyroid  No results for input(s): TSH, FREET4 in the last 168 hours.  BNP Recent Labs  [name] 08/02/23 1109 08/03/23 1238  BNP 729* 994.8*    DDimer  Recent Labs  [name] 08/02/23 1109  DDIMER 0.46    Radiology/Studies:  DG Chest 2 View Result Date: 08/03/2023 CLINICAL DATA:  Short of breath, hypoxia EXAM: CHEST - 2 VIEW COMPARISON:  Prior chest x-ray 08/02/2023 FINDINGS: Cardiomegaly and pulmonary vascular congestion without overt edema. Chronic elevation of the left hemidiaphragm with associated left lower lobe atelectasis. IMPRESSION: Similar appearance of the chest including cardiomegaly and pulmonary vascular congestion without overt edema. Chronic elevation of the left hemidiaphragm with chronic atelectasis. Electronically Signed   By: Wilkie Lent M.D.   On: 08/03/2023 14:58    Assessment and Plan: Persistent atrial fibrillation History of cardioversion in the past, most recent July 27, 2023 Rate control: Metoprolol . Rhythm  control: N/A. Thromboembolic prophylaxis: Xarelto  His prior cardioversion was able to maintain sinus rhythm for many years according to the patient. However the most recent cardioversion done on July 27, 2023 lasted until August 06, 2023. He is currently in A-fib with controlled ventricular rate and asymptomatic and not hypotensive Would focus on rate control strategy for now. Recommend outpatient atrial fibrillation clinic follow-up for consideration of antiarrhythmic medications like Tikosyn. Currently trying to avoid amiodarone use due to his chronic hypoxic respiratory failure already on nasal cannula oxygen .  Click Here to Calculate/Change CHADS2VASc Score The patient's CHADS2-VASc score is 5, indicating a 7.2% annual risk of stroke.   CHF History: Yes HTN History: Yes Diabetes History: Yes Stroke History: No Vascular Disease History: Yes   Coronary artery disease, nonobstructive: Denies anginal chest pain.  Reemphasized importance of secondary prevention and improving modifiable risk factors.  Acute on chronic heart failure with mildly reduced LVEF: Stage C, NYHA class II/III Likely precipitated by his atrial fibrillation with poorly controlled ventricular rate prior to hospitalization and Klebsiella  UTI Patient has been diuresed very well with primary team Net IO Since Admission: -8,440 mL [08/07/23 0909] Appears to be euvolemic on physical examination. Continue Lasix  40 mg p.o. daily. Continue hydralazine  75 mg p.o. 3 times daily. Continue Imdur  30 mg p.o. daily. Continue losartan  25 mg p.o. daily, consider transitioning to Entresto as outpatient Consider addition of spironolactone  as outpatient.  History of PE: On anticoagulation  Chronic respiratory failure: Multifactorial On nasal cannula oxygen  at home  Sleep apnea: Reemphasized the importance of CPAP compliance.  Obesity due to excess calories: Reemphasized importance of heart healthy diet and physical activity as  tolerated  Patient's presenting symptoms were hypoxia and volume overload resulting in shortness of breath.  He has been diuresed very well and is euvolemic.  His oxygen  requirements are now back to baseline.  Though he has ERAF additional hospitalization days will not significantly change management as his rates are very well-controlled.  Would recommend rate control strategy for now and as mentioned above outpatient A-fib clinic follow-up to discuss antiarrhythmic such as Tikosyn.  He is very compliant with good family support.  Will continue the same heart failure medications.  Recommendations conveyed to the daughter and wife during consultation and they are in agreement.  Recommendations also conveyed to attending physician.  Will arrange outpatient follow-up.  Medical decision making: High (persistent atrial fibrillation, heart failure, and discharge planning) Reviewed the most recent history and physical, and last progress note by attending physician dated 08/06/2023 EKG independently reviewed. Strict I's and O's, daily weights, vital signs, and telemetry independently reviewed Most recent echocardiogram independently reviewed Prescription drug management Coordination of [name] between patient/family/attending physician and outpatient follow-up  Risk Assessment/Risk Scores:       [place]  Heart Association (NYHA) Functional Class NYHA Class II  CHA2DS2-VASc Score = 5   This indicates a 7.2% annual risk of stroke. The patient's score is based upon: CHF History: 1 HTN History: 1 Diabetes History: 1 Stroke History: 0 Vascular Disease History: 1 Age Score: 1 Gender Score: 0     [facility] will sign off.   The patient is ready for discharge today from a cardiac standpoint. Medication Recommendations: See above Other recommendations (labs, testing, etc): A-fib clinic follow-up and will arrange outpatient follow-up with Thomas Ferrier for uptitration of GDMT Follow up as an  outpatient: None  For questions [name] updates, please contact  [facility] Please consult [email or web address] for contact info under    Signed, Ladona Rosten, DO  08/07/2023 9:08 AM

## 2023-08-07 NOTE — H&P (View-Only) (Signed)
 Cardiology Consultation   Patient ID: Thomas [name] MRN: [id number]; DOB: 03-07-1950  Admit date: 08/03/2023 Date of Consult: 08/07/2023  PCP:  Thomas Harlene POUR, NP   [facility] Providers Cardiologist:  None  Cardiology APP:  Lelon Thomas DASEN, PA-C       Patient Profile: Thomas [name] is a 73 y.o. male with a hx of Urinary who is being seen 08/07/2023 for the evaluation of Afib and HF at the request of  Persistent atrial fibrillation, heart failure with mildly reduced LVEF, DVT/PE 2018, hypertension, diabetes mellitus type 2, nonobstructive CAD, OSA, history of prostate cancer/urinary retention/suprapubic catheter.  History of Present Illness: Thomas [name] with a known complex past medical history as outlined above presented to the hospital with symptoms consistent with clinical heart failure.  He was admitted to medicine service and underwent diuresis.  Cardiology consulted during this hospitalization predominately for A-fib management.  At the time of evaluation patient is accompanied by his younger daughter at bedside and his wife is present over the phone on speaker.  Patient recently had gone to his urologist office for exchange of suprapubic catheter and was noted to be short of breath and hypoxic and sent to the ED for further evaluation and management.  Patient has undergone IV diuresis with a net negative urine output of at least 8 L based on strict I's and O's.  Clinically he denies orthopnea, PND.  Lower extremity swelling has significantly improved.  He still has shortness of breath with effort related activities that is chronic and stable and mostly back to baseline.  Patient was in the process of being discharged this morning.  But reverted back to atrial fibrillation as of 08/06/2023 as per telemetry.  His rates are very well-controlled.  He is very well compliant with medical therapy.  He informs me that he had cardioversion several years ago which  helped maintain sinus rhythm.  However, his recent cardioversion was done on 6 11/2023 and now he is reverted back to A-fib..  Otherwise patient is stable and wishes to go home.  Past Medical History:  Diagnosis Date   Acute on chronic diastolic CHF (congestive heart failure) (HCC) 06/01/2017   Arthritis    Cancer (HCC) 2010   Prostate   Chronic kidney disease    Diabetes mellitus    Diabetic neuropathy (HCC)    feet   DVT (deep venous thrombosis) (HCC)    Genetic testing 06/22/2016   [name] underwent genetic counseling and testing for hereditary cancer syndromes on 05/14/2016. His results were negative for mutations in all 46 genes analyzed by [facility]'s 46-gene Common Hereditary Cancers Panel. Genes analyzed include: APC, ATM, AXIN2, BARD1, BMPR1A, BRCA1, BRCA2, BRIP1, CDH1, CDKN2A, CHEK2, CTNNA1, DICER1, EPCAM, GREM1, HOXB13, KIT, MEN1, MLH1, MSH2, MSH3, MSH6, MUTYH, NB   GERD (gastroesophageal reflux disease)    Hypertension    PE (pulmonary thromboembolism) (HCC)    Pneumonia    Sleep apnea    not wearing CPAP   Suprapubic catheter (HCC) 11/17/2019    Past Surgical History:  Procedure Laterality Date   CARDIOVERSION N/A 06/03/2017   Procedure: CARDIOVERSION;  Surgeon: Thomas Oneil BROCKS, MD;  Location: [facility] ENDOSCOPY;  Service: Cardiovascular;  Laterality: N/A;   CARDIOVERSION N/A 07/27/2023   Procedure: CARDIOVERSION;  Surgeon: Thomas Maude BROCKS, MD;  Location: [facility] INVASIVE CV LAB;  Service: Cardiovascular;  Laterality: N/A;   CATARACT EXTRACTION Left 07/31/2021   Dr.Glenn, Thomas [name]   COLONOSCOPY  COLONOSCOPY WITH PROPOFOL  N/A 10/20/2018   Procedure: COLONOSCOPY WITH PROPOFOL ;  Surgeon: Thomas Iha, MD;  Location: [facility] ENDOSCOPY;  Service: Gastroenterology;  Laterality: N/A;   HERNIA REPAIR     KNEE SURGERY     MULTIPLE TOOTH EXTRACTIONS     POLYPECTOMY  10/20/2018   Procedure: POLYPECTOMY;  Surgeon: Thomas Iha, MD;  Location: [facility] ENDOSCOPY;  Service:  Gastroenterology;;   PROSTATE SURGERY     RADIOACTIVE SEED IMPLANT     RIGHT/LEFT HEART CATH AND CORONARY ANGIOGRAPHY N/A 07/06/2017   Procedure: RIGHT/LEFT HEART CATH AND CORONARY ANGIOGRAPHY;  Surgeon: Thomas Candyce RAMAN, MD;  Location: [facility] INVASIVE CV LAB;  Service: Cardiovascular;  Laterality: N/A;   SHOULDER SURGERY     TOTAL KNEE ARTHROPLASTY Right 11/19/2016   Procedure: RIGHT TOTAL KNEE ARTHROPLASTY;  Surgeon: Thomas Kay HERO, MD;  Location: [facility] OR;  Service: Orthopedics;  Laterality: Right;   uretha surgery-2014         Scheduled Meds:  atorvastatin   40 mg Oral Daily   furosemide   40 mg Oral Daily   hydrALAZINE   75 mg Oral TID   isosorbide  mononitrate  30 mg Oral Daily   losartan   25 mg Oral q AM   metoprolol  tartrate  100 mg Oral BID   rivaroxaban   20 mg Oral Q supper   Continuous Infusions:  cefTRIAXone  (ROCEPHIN )  IV 1 g (08/07/23 0121)   PRN Meds: acetaminophen , ondansetron  (ZOFRAN ) IV, senna  Allergies:    Allergies  Allergen Reactions   Lisinopril  Cough and Other (See Comments)    Muscle pain    Social History:   Social History   Socioeconomic History   Marital status: Married    Spouse name: Thomas [name]   Number of children: Not on file   Years of education: Not on file   Highest education level: 12th grade  Occupational History   Not on file  Tobacco Use   Smoking status: Never    Passive exposure: Never   Smokeless tobacco: Never  Vaping Use   Vaping status: Never Used  Substance and Sexual Activity   Alcohol  use: No    Comment: never   Drug use: No   Sexual activity: Yes  Other Topics Concern   Not on file  Social History Narrative   Not on file   Social Drivers of Health   Financial Resource Strain: Patient Declined (07/06/2023)   Overall Financial Resource Strain (CARDIA)    Difficulty of Paying Living Expenses: Patient declined  Food Insecurity: No Food Insecurity (08/04/2023)   Hunger Vital Sign    Worried About Running Out of Food  in the Last Year: Never true    Ran Out of Food in the Last Year: Never true  Recent Concern: Food Insecurity - Food Insecurity Present (07/03/2023)   Hunger Vital Sign    Worried About Running Out of Food in the Last Year: Sometimes true    Ran Out of Food in the Last Year: Sometimes true  Transportation Needs: No Transportation Needs (08/04/2023)   PRAPARE - Administrator, Civil Service (Medical): No    Lack of Transportation (Non-Medical): No  Physical Activity: Inactive (12/09/2017)   Exercise Vital Sign    Days of Exercise per Week: 0 days    Minutes of Exercise per Session: 0 min  Stress: No Stress Concern Present (12/09/2017)   [employer] of Occupational Health - Occupational Stress Questionnaire    Feeling of Stress : Only a little  Social Connections:  Moderately Isolated (08/04/2023)   Social Connection and Isolation Panel    Frequency of Communication with Friends and Family: More than three times a week    Frequency of Social Gatherings with Friends and Family: Three times a week    Attends Religious Services: Never    Active Member of Clubs or Organizations: No    Attends Banker Meetings: Never    Marital Status: Married  Catering manager Violence: Not At Risk (08/04/2023)   Humiliation, Afraid, Rape, and Kick questionnaire    Fear of Current or Ex-Partner: No    Emotionally Abused: No    Physically Abused: No    Sexually Abused: No    Family History:   Family History  Problem Relation Age of Onset   Breast cancer Mother 55       d.89   Breast cancer Sister 30       d.30   Leukemia Brother 18       d.20   Breast cancer Maternal Aunt 40       d.40s   Lung cancer Maternal Uncle    Prostate cancer Paternal Uncle    Prostate cancer Brother        recurred recently at age 41   Other Brother 15       spinal tumor   Cervical cancer Other 22       d.22   Cancer Sister 61       unspecified type   Cancer Maternal Uncle 80        unspecified type     ROS:  Review of Systems  Cardiovascular:  Positive for dyspnea on exertion. Negative for chest pain, claudication, irregular heartbeat, leg swelling, near-syncope, orthopnea, palpitations, paroxysmal nocturnal dyspnea and syncope.  Respiratory:  Negative for shortness of breath.   Hematologic/Lymphatic: Negative for bleeding problem.   Physical Exam/Data: Vitals:   08/06/23 2321 08/07/23 0357 08/07/23 0405 08/07/23 0843  BP: 124/79 129/86  125/70  Pulse: 87 87    Resp: 20 20 14    Temp: 98.3 F (36.8 C) 97.8 F (36.6 C)  97.8 F (36.6 C)  TempSrc: Oral Oral  Oral  SpO2: 96% 96%  96%  Weight:   (!) 136.8 kg   Height:        Intake/Output Summary (Last 24 hours) at 08/07/2023 0908 Last data filed at 08/06/2023 1522 Gross per 24 hour  Intake --  Output 2100 ml  Net -2100 ml   Net IO Since Admission: -8,440 mL [08/07/23 0908]     08/07/2023    4:05 AM 08/06/2023    6:43 AM 08/04/2023    5:21 AM  Last 3 Weights  Weight (lbs) 301 lb 9.4 oz 301 lb 9.4 oz 307 lb 5.1 oz  Weight (kg) 136.8 kg 136.8 kg 139.4 kg     Body mass index is 44.54 kg/m.  General:  Well nourished, well developed, in no acute distress HEENT: normal, on nasal cannula oxygen  Neck: no JVD Vascular: No carotid bruits Cardiac: Irregularly irregular, variable S1-S2, no murmurs rubs or gallops appreciated Lungs:  clear to auscultation bilaterally, no wheezing, rhonchi or rales  Abd: soft, nontender, no hepatomegaly, suprapubic catheter Ext: no edema, warm to touch Musculoskeletal:  No deformities, BUE and BLE strength normal and equal Skin: warm and dry  Psych:  Normal affect   EKG:  The EKG was personally reviewed and demonstrates: 08/06/2023: Afib w/ RVR, PVCs, RAD, non specific ST-T changes.   Telemetry:  Telemetry was  personally reviewed and demonstrates:  Afib w/ cVR   Relevant CV Studies: Cardioversions July 27, 2023 300 J x 1  Laboratory Data: High Sensitivity Troponin:    Recent Labs  Lab 08/03/23 1238 08/03/23 1624  TROPONINIHS 21* 17     Chemistry Recent Labs  Lab 08/04/23 0811 08/05/23 0918 08/06/23 0307 08/06/23 1118  NA 144 145 143  --   K 3.8 4.0 4.0  --   CL 107 106 105  --   CO2 25 22 28   --   GLUCOSE 111* 86 155*  --   BUN 22 27* 31*  --   CREATININE 1.39* 1.53* 1.64*  --   CALCIUM  9.0 8.7* 8.1*  --   MG  --   --   --  2.2  GFRNONAA 54* 48* 44*  --   ANIONGAP 12 17* 10  --     Recent Labs  Lab 08/02/23 1109 08/03/23 1238  PROT 6.8 7.3  ALBUMIN  --  3.7  AST 15 25  ALT 34 38  ALKPHOS  --  56  BILITOT 1.0 1.7*   Lipids No results for input(s): CHOL, TRIG, HDL, LABVLDL, LDLCALC, CHOLHDL in the last 168 hours.  Hematology Recent Labs  Lab 08/04/23 0137 08/05/23 0918 08/06/23 0307  WBC 8.6 7.4 8.3  RBC 4.21* 4.16* 3.99*  HGB 12.1* 11.9* 11.5*  HCT 38.6* 38.6* 36.7*  MCV 91.7 92.8 92.0  MCH 28.7 28.6 28.8  MCHC 31.3 30.8 31.3  RDW 15.0 15.1 14.9  PLT 254 237 253   Thyroid  No results for input(s): TSH, FREET4 in the last 168 hours.  BNP Recent Labs  Lab 08/02/23 1109 08/03/23 1238  BNP 729* 994.8*    DDimer  Recent Labs  Lab 08/02/23 1109  DDIMER 0.46    Radiology/Studies:  DG Chest 2 View Result Date: 08/03/2023 CLINICAL DATA:  Short of breath, hypoxia EXAM: CHEST - 2 VIEW COMPARISON:  Prior chest x-ray 08/02/2023 FINDINGS: Cardiomegaly and pulmonary vascular congestion without overt edema. Chronic elevation of the left hemidiaphragm with associated left lower lobe atelectasis. IMPRESSION: Similar appearance of the chest including cardiomegaly and pulmonary vascular congestion without overt edema. Chronic elevation of the left hemidiaphragm with chronic atelectasis. Electronically Signed   By: Wilkie Lent M.D.   On: 08/03/2023 14:58    Assessment and Plan: Persistent atrial fibrillation History of cardioversion in the past, most recent July 27, 2023 Rate control: Metoprolol . Rhythm  control: N/A. Thromboembolic prophylaxis: Xarelto  His prior cardioversion was able to maintain sinus rhythm for many years according to the patient. However the most recent cardioversion done on July 27, 2023 lasted until August 06, 2023. He is currently in A-fib with controlled ventricular rate and asymptomatic and not hypotensive Would focus on rate control strategy for now. Recommend outpatient atrial fibrillation clinic follow-up for consideration of antiarrhythmic medications like Tikosyn. Currently trying to avoid amiodarone use due to his chronic hypoxic respiratory failure already on nasal cannula oxygen .  Click Here to Calculate/Change CHADS2VASc Score The patient's CHADS2-VASc score is 5, indicating a 7.2% annual risk of stroke.   CHF History: Yes HTN History: Yes Diabetes History: Yes Stroke History: No Vascular Disease History: Yes   Coronary artery disease, nonobstructive: Denies anginal chest pain.  Reemphasized importance of secondary prevention and improving modifiable risk factors.  Acute on chronic heart failure with mildly reduced LVEF: Stage C, NYHA class II/III Likely precipitated by his atrial fibrillation with poorly controlled ventricular rate prior to hospitalization and Klebsiella  UTI Patient has been diuresed very well with primary team Net IO Since Admission: -8,440 mL [08/07/23 0909] Appears to be euvolemic on physical examination. Continue Lasix  40 mg p.o. daily. Continue hydralazine  75 mg p.o. 3 times daily. Continue Imdur  30 mg p.o. daily. Continue losartan  25 mg p.o. daily, consider transitioning to Entresto as outpatient Consider addition of spironolactone  as outpatient.  History of PE: On anticoagulation  Chronic respiratory failure: Multifactorial On nasal cannula oxygen  at home  Sleep apnea: Reemphasized the importance of CPAP compliance.  Obesity due to excess calories: Reemphasized importance of heart healthy diet and physical activity as  tolerated  Patient's presenting symptoms were hypoxia and volume overload resulting in shortness of breath.  He has been diuresed very well and is euvolemic.  His oxygen  requirements are now back to baseline.  Though he has ERAF additional hospitalization days will not significantly change management as his rates are very well-controlled.  Would recommend rate control strategy for now and as mentioned above outpatient A-fib clinic follow-up to discuss antiarrhythmic such as Tikosyn.  He is very compliant with good family support.  Will continue the same heart failure medications.  Recommendations conveyed to the daughter and wife during consultation and they are in agreement.  Recommendations also conveyed to attending physician.  Will arrange outpatient follow-up.  Medical decision making: High (persistent atrial fibrillation, heart failure, and discharge planning) Reviewed the most recent history and physical, and last progress note by attending physician dated 08/06/2023 EKG independently reviewed. Strict I's and O's, daily weights, vital signs, and telemetry independently reviewed Most recent echocardiogram independently reviewed Prescription drug management Coordination of [name] between patient/family/attending physician and outpatient follow-up  Risk Assessment/Risk Scores:       New [place]  Heart Association (NYHA) Functional Class NYHA Class II  CHA2DS2-VASc Score = 5   This indicates a 7.2% annual risk of stroke. The patient's score is based upon: CHF History: 1 HTN History: 1 Diabetes History: 1 Stroke History: 0 Vascular Disease History: 1 Age Score: 1 Gender Score: 0     [facility] will sign off.   The patient is ready for discharge today from a cardiac standpoint. Medication Recommendations: See above Other recommendations (labs, testing, etc): A-fib clinic follow-up and will arrange outpatient follow-up with Thomas Ferrier for uptitration of GDMT Follow up as an  outpatient: None  For questions or updates, please contact  [facility] Please consult [email or web address] for contact info under    Signed, Ladona Rosten, DO  08/07/2023 9:08 AM

## 2023-08-07 NOTE — Progress Notes (Signed)
 Mobility Specialist Progress Note:   08/07/23 1100  Mobility  Activity Ambulated with assistance in room  Level of Assistance Standby assist, set-up cues, supervision of patient - no hands on  Assistive Device Front wheel walker  Distance Ambulated (ft) 40 ft  Activity Response Tolerated well  Mobility Referral Yes  Mobility visit 1 Mobility  Mobility Specialist Start Time (ACUTE ONLY) 0935  Mobility Specialist Stop Time (ACUTE ONLY) 0947  Mobility Specialist Time Calculation (min) (ACUTE ONLY) 12 min   Pt received in bed, hesitant but agreeable to mobility. Pt requested to stay in room. Able to ambulate a short distance d/t fatigue. Pt was SOB at EOS, SPO2 was [facility] (see below). At EOS peaked to high 120's for a short moment before going back down to 110's. Left seated EOB w/ call bell and personal belongings in reach. All needs met. RN aware. Left on 3L/min.  Pre Mobility 3L/min SPO2 90% During Mobility HR 102-127 Post Mobility 3L/min SPO2 91% HR 108  Thersia Minder Mobility Specialist  Please contact vis Secure Chat or  Rehab Office [phone]

## 2023-08-07 NOTE — Telephone Encounter (Signed)
 Hi Thomas [name] patient was recently admitted for atrial fibrillation.  Dr. Michele would like to schedule follow-up with A-fib clinic sometime next week if possible.  Could you please help me with this?

## 2023-08-07 NOTE — Discharge Summary (Signed)
 Physician Discharge Summary  Thomas [name] FMW:[id number] DOB: 06-13-1950 DOA: 08/03/2023  PCP: Thomas Harlene POUR, NP  Admit date: 08/03/2023 Discharge date: 08/07/2023  Time spent: 45 minutes  Recommendations for Outpatient Follow-up:  [facility], A-fib clinic in 2 to 3 weeks [name], PA-C in 2 weeks   Discharge Diagnoses:  Principal Problem:   Heart failure with reduced ejection fraction (HCC) Active Problems:   Acute on chronic respiratory failure with hypoxia (HCC)   Essential hypertension   Chronic anticoagulation   History of DVT (deep vein thrombosis)   Paroxysmal atrial fibrillation (HCC)   Type 2 diabetes mellitus with chronic kidney disease, without long-term current use of insulin  (HCC)   Normocytic anemia   Urinary retention   Presence of suprapubic catheter (HCC)   Sleep apnea   Severe obesity (BMI >= 40) (HCC)   Acute heart failure with mildly reduced ejection fraction (HFmrEF, 41-49%) (HCC)   Discharge Condition: Improved  Diet recommendation: Heart healthy, diabetic  Filed Weights   08/04/23 0521 08/06/23 0643 08/07/23 0405  Weight: (!) 139.4 kg (!) 136.8 kg (!) 136.8 kg    History of present illness:  73/M w chronic respiratory failure on 3 L home O2, OSA OHS, pulmonary hypertension, DVT/PE on Xarelto , systolic CHF, paroxysmal A-fib, type 2 diabetes mellitus, prostate cancer, urinary retention, chronic suprapubic catheter presented to the ED with shortness of breath and recent weight gain.  Recently underwent cardioversion on 6/10 followed by mild improvement however symptoms worsened with progressive dyspnea, fatigue, worsening hypoxia.  Compliant with diuretics and diet He was at the urology office 6/17 had his suprapubic catheter exchange when he was noted to be hypoxic in the 80s despite being on his 4 L of oxygen .  He was transferred to the ED for further evaluation.  In the ED hypertensive, hypoxic, BNP 994, troponin 21, creatinine 1.6,  chest x-ray with cardiomegaly, pulmonary vascular congestion   Hospital Course:   Acute on chronic respiratory failure  Acute on chronic combined CHF  - History of progressive dyspnea, 14 pound weight gain despite Mounjaro  -last echo 5/25 with EF 40-45% and mod reducedRV -Recent cardioversion 6/10, currently in sinus rhythm, suspect volume overload likely triggered by recent prolonged A-fib - Improving with diuresis, 8.4 L negative,, weight down 19 LB, appears close to euvolemic, transition to oral Lasix  40 Mg daily today, continue losartan , metoprolol , hydralazine  -Poor candidate for SGLT2i with suprapubic catheter -Weaned O2 down to 3/4 L -Follow-up with [facility]   Hematuria Klebsiella UTI -Had some hematuria yesterday a.m. which has improved - Held Xarelto  yesterday -Suprapubic catheter was changed just prior to admission - Symptoms improved, resume Xarelto , treated with IV ceftriaxone , changed to oral Keflex  for 2 more days  Paroxysmal atrial fibrillation on chronic anticoagulation Patient underwent cardioversion on 6/10.  . - Continue metoprolol , r Xarelto  -Overnight went back into A-fib, briefly RVR given digoxin , heart rate improved today, seen by cardiology in consultation, recommended to continue current regimen and follow-up in A-fib clinic in a few weeks   Essential hypertension Improving - Continue hydralazine , metoprolol , losartan    History of DVT/pulmonary embolism on chronic anticoagulation - Restarted Xarelto    Diabetes mellitus type 2, without long-term use of insulin  Last available hemoglobin A1c was 5.5 when checked on 05/24/2023. - Resume metformin ,  Mounjaro  -Poor candidate for Jardiance    Normocytic anemia Stay well, monitor   Chronic kidney disease stage IIIb Creatinine noted to be 1.6.  Baseline creatinine appears to range from  1.5-1.7.   Urinary retention s/p suprapubic catheter Patient had suprapubic catheter replaced by urology prior to  arrival.  6/18   Morbid obesity BMI 47.21 kg/m. - Continue Mounjaro  in outpatient setting   OSA - Continue CPAP at night  Discharge Exam: Vitals:   08/07/23 0405 08/07/23 0843  BP:  125/70  Pulse:    Resp: 14   Temp:  97.8 F (36.6 C)  SpO2:  96%    Gen: Awake, Alert, Oriented X 3, obese HEENT: no JVD Lungs: Good air movement bilaterally, CTAB CVS: S1S2/irregular rhythm Abd: soft, Non tender, non distended, BS present, suprapubic catheter Extremities: No edema Skin: no new rashes on exposed skin   Discharge Instructions   Discharge Instructions     Diet - low sodium heart healthy   Complete by: As directed    Diet Carb Modified   Complete by: As directed    Increase activity slowly   Complete by: As directed       Allergies as of 08/07/2023       Reactions   Lisinopril  Cough, Other (See Comments)   Muscle pain        Medication List     STOP taking these medications    empagliflozin  10 MG Tabs tablet Commonly known as: JARDIANCE        TAKE these medications    Accu-Chek Aviva Plus test strip Generic drug: glucose blood Use to test blood sugar daily.   Accu-Chek Aviva Soln Use once daily as directed dx E11.22   Accu-Chek Softclix Lancet Dev Kit Use to test blood sugar daily   Accu-Chek Softclix Lancets lancets Use to test blood sugar daily   TRUEplus Lancets 28G Misc TEST BLOOD SUGAR EVERY DAY AS NEEDED   aspirin  EC 81 MG tablet Take 81 mg by mouth daily.   atorvastatin  40 MG tablet Commonly known as: LIPITOR TAKE 1 TABLET EVERY DAY What changed: additional instructions   B-D SINGLE USE SWABS REGULAR Pads Use in testing blood sugar daily. Dx: E11.22   calcium  carbonate 500 MG chewable tablet Commonly known as: TUMS - dosed in mg elemental calcium  Chew 2 tablets by mouth as needed for indigestion or heartburn.   cephALEXin  250 MG capsule Commonly known as: KEFLEX  Take 1 capsule (250 mg total) by mouth 3 (three) times daily  for 2 days.   furosemide  20 MG tablet Commonly known as: LASIX  Take 2 tablets (40 mg total) by mouth daily. What changed: See the new instructions.   hydrALAZINE  25 MG tablet Commonly known as: APRESOLINE  TAKE 3 TABLETS THREE TIMES DAILY What changed: See the new instructions.   isosorbide  mononitrate 30 MG 24 hr tablet Commonly known as: IMDUR  Take 1 tablet (30 mg total) by mouth daily. Start taking on: August 08, 2023   losartan  50 MG tablet Commonly known as: COZAAR  Take 0.5 tablets (25 mg total) by mouth daily. What changed: how much to take   metFORMIN  500 MG tablet Commonly known as: GLUCOPHAGE  TAKE 1 TABLET TWICE DAILY WITH MEALS   metoprolol  tartrate 100 MG tablet Commonly known as: LOPRESSOR  Take 1 tablet (100 mg total) by mouth 2 (two) times daily.   Mounjaro  15 MG/0.5ML Pen Generic drug: tirzepatide  Inject 15 mg into the skin once a week.   MULTIVITAMIN ADULT PO Take 2 each by mouth daily as needed (immune system).   potassium chloride  SA 20 MEQ tablet Commonly known as: KLOR-CON  M TAKE 1 TABLET TWICE DAILY What changed: when to take this  Senna 8.7 MG Chew Chew 1 tablet by mouth as needed. What changed:  when to take this reasons to take this   spironolactone  25 MG tablet Commonly known as: ALDACTONE  Take 1 tablet (25 mg total) by mouth daily.   True Metrix Air Glucose Meter w/Device Kit 1 Device by Does not apply route daily as needed. E11.22   Vitamin D3 50 MCG (2000 UT) Tabs Take 1 tablet (2,000 Units) by mouth daily.   Xarelto  20 MG Tabs tablet Generic drug: rivaroxaban  Take 1 tablet (20 mg total) by mouth daily with supper.   zinc  gluconate 50 MG tablet Take 1 tablet (50 mg total) by mouth daily.               Durable Medical Equipment  (From admission, onward)           Start     Ordered   08/06/23 1347  For home use only DME oxygen   Once       Comments: POC eval 1-6L pulse dose- if qualifies dispense  Question Answer  Comment  Length of Need Lifetime   Mode or (Route) Nasal cannula   Liters per Minute 3   Frequency Continuous (stationary and portable oxygen  unit needed)   Oxygen  conserving device Yes   Oxygen  delivery system Gas      08/06/23 1347           Allergies  Allergen Reactions   Lisinopril  Cough and Other (See Comments)    Muscle pain      The results of significant diagnostics from this hospitalization (including imaging, microbiology, ancillary and laboratory) are listed below for reference.    Significant Diagnostic Studies: DG Chest 2 View Result Date: 08/03/2023 CLINICAL DATA:  Short of breath, hypoxia EXAM: CHEST - 2 VIEW COMPARISON:  Prior chest x-ray 08/02/2023 FINDINGS: Cardiomegaly and pulmonary vascular congestion without overt edema. Chronic elevation of the left hemidiaphragm with associated left lower lobe atelectasis. IMPRESSION: Similar appearance of the chest including cardiomegaly and pulmonary vascular congestion without overt edema. Chronic elevation of the left hemidiaphragm with chronic atelectasis. Electronically Signed   By: Wilkie Lent M.D.   On: 08/03/2023 14:58   DG Chest 2 View Result Date: 08/02/2023 CLINICAL DATA:  shortness of breath, low o2 EXAM: CHEST - 2 VIEW COMPARISON:  Jul 02, 2023 FINDINGS: The study is degraded by patient's body habitus and motion. Unchanged elevation the left hemidiaphragm. Fibrolinear scarring or subsegmental atelectasis in the left lung base. Central pulmonary vascular congestion. No focal airspace consolidation, pleural effusion, or pneumothorax. Mild cardiomegaly. No acute fracture or destructive lesion. IMPRESSION: Cardiomegaly with redemonstrated central pulmonary vascular congestion. No lobar pneumonia or overt pulmonary edema. Electronically Signed   By: Rogelia Myers M.D.   On: 08/02/2023 12:34    Microbiology: Recent Results (from the past 240 hours)  Resp panel by RT-PCR (RSV, Flu A&B, Covid) Anterior Nasal  Swab     Status: None   Collection Time: 08/03/23  4:20 PM   Specimen: Anterior Nasal Swab  Result Value Ref Range Status   SARS Coronavirus 2 by RT PCR NEGATIVE NEGATIVE Final   Influenza A by PCR NEGATIVE NEGATIVE Final   Influenza B by PCR NEGATIVE NEGATIVE Final    Comment: (NOTE) The Xpert Xpress SARS-CoV-2/FLU/RSV plus assay is intended as an aid in the diagnosis of influenza from Nasopharyngeal swab specimens and should not be used as a sole basis for treatment. Nasal washings and aspirates are unacceptable for Xpert Xpress SARS-CoV-2/FLU/RSV  testing.  Fact Sheet for Patients: BloggerCourse.com  Fact Sheet for Healthcare Providers: SeriousBroker.it  This test is not yet approved or cleared by the [place]  FDA and has been authorized for detection and/or diagnosis of SARS-CoV-2 by FDA under an Emergency Use Authorization (EUA). This EUA will remain in effect (meaning this test can be used) for the duration of the COVID-19 declaration under Section 564(b)(1) of the Act, 21 U.S.C. section 360bbb-3(b)(1), unless the authorization is terminated or revoked.     Resp Syncytial Virus by PCR NEGATIVE NEGATIVE Final    Comment: (NOTE) Fact Sheet for Patients: BloggerCourse.com  Fact Sheet for Healthcare Providers: SeriousBroker.it  This test is not yet approved or cleared by the [place]  FDA and has been authorized for detection and/or diagnosis of SARS-CoV-2 by FDA under an Emergency Use Authorization (EUA). This EUA will remain in effect (meaning this test can be used) for the duration of the COVID-19 declaration under Section 564(b)(1) of the Act, 21 U.S.C. section 360bbb-3(b)(1), unless the authorization is terminated or revoked.  Performed at [facility], [address], [place], [place] [address]   Urine Culture     Status: Abnormal   Collection Time:  08/03/23  4:34 PM   Specimen: Urine, Catheterized  Result Value Ref Range Status   Specimen Description URINE, CATHETERIZED  Final   Special Requests   Final    NONE Reflexed from [id number] Performed at [facility], [address] [place], [place] [address]    Culture >=100,000 COLONIES/mL KLEBSIELLA PNEUMONIAE (A)  Final   Report Status 08/05/2023 FINAL  Final   Organism ID, Bacteria KLEBSIELLA PNEUMONIAE (A)  Final      Susceptibility   Klebsiella pneumoniae - MIC*    AMPICILLIN RESISTANT Resistant     CEFAZOLIN  <=4 SENSITIVE Sensitive     CEFEPIME <=0.12 SENSITIVE Sensitive     CEFTRIAXONE  <=0.25 SENSITIVE Sensitive     CIPROFLOXACIN  <=0.25 SENSITIVE Sensitive     GENTAMICIN  <=1 SENSITIVE Sensitive     IMIPENEM 1 SENSITIVE Sensitive     NITROFURANTOIN  128 RESISTANT Resistant     TRIMETH /SULFA  <=20 SENSITIVE Sensitive     AMPICILLIN/SULBACTAM 4 SENSITIVE Sensitive     PIP/TAZO <=4 SENSITIVE Sensitive ug/mL    * >=100,000 COLONIES/mL KLEBSIELLA PNEUMONIAE     Labs: Basic Metabolic Panel: Recent Labs  Lab 08/02/23 1109 08/03/23 1238 08/04/23 0811 08/05/23 0918 08/06/23 0307 08/06/23 1118  NA 144 144 144 145 143  --   K 4.4 4.2 3.8 4.0 4.0  --   CL 109 109 107 106 105  --   CO2 24 24 25 22 28   --   GLUCOSE 113* 102* 111* 86 155*  --   BUN 26* 25* 22 27* 31*  --   CREATININE 1.49* 1.60* 1.39* 1.53* 1.64*  --   CALCIUM  9.3 9.1 9.0 8.7* 8.1*  --   MG  --   --   --   --   --  2.2   Liver Function Tests: Recent Labs  Lab 08/02/23 1109 08/03/23 1238  AST 15 25  ALT 34 38  ALKPHOS  --  56  BILITOT 1.0 1.7*  PROT 6.8 7.3  ALBUMIN  --  3.7   No results for input(s): LIPASE, AMYLASE in the last 168 hours. No results for input(s): AMMONIA in the last 168 hours. CBC: Recent Labs  Lab 08/02/23 1109 08/03/23 1238 08/04/23 0137 08/05/23 0918 08/06/23 0307  WBC 9.1 8.8 8.6  7.4 8.3  NEUTROABS 7,680 7.3  --   --   --   HGB 12.3* 12.3* 12.1* 11.9* 11.5*   HCT 39.6 39.4 38.6* 38.6* 36.7*  MCV 92.1 92.1 91.7 92.8 92.0  PLT 283 257 254 237 253   Cardiac Enzymes: No results for input(s): CKTOTAL, CKMB, CKMBINDEX, TROPONINI in the last 168 hours. BNP: BNP (last 3 results) Recent Labs    07/03/23 1726 08/02/23 1109 08/03/23 1238  BNP 418.8* 729* 994.8*    ProBNP (last 3 results) Recent Labs    11/03/22 1000  PROBNP 659*    CBG: No results for input(s): GLUCAP in the last 168 hours.     Signed:  Sigurd Pac MD.  [facility] 08/07/2023, 10:41 AM

## 2023-08-09 ENCOUNTER — Telehealth: Payer: Self-pay

## 2023-08-09 DIAGNOSIS — E1122 Type 2 diabetes mellitus with diabetic chronic kidney disease: Secondary | ICD-10-CM | POA: Diagnosis not present

## 2023-08-09 DIAGNOSIS — B961 Klebsiella pneumoniae [K. pneumoniae] as the cause of diseases classified elsewhere: Secondary | ICD-10-CM | POA: Diagnosis not present

## 2023-08-09 DIAGNOSIS — I5043 Acute on chronic combined systolic (congestive) and diastolic (congestive) heart failure: Secondary | ICD-10-CM | POA: Diagnosis not present

## 2023-08-09 DIAGNOSIS — N390 Urinary tract infection, site not specified: Secondary | ICD-10-CM | POA: Diagnosis not present

## 2023-08-09 DIAGNOSIS — J9621 Acute and chronic respiratory failure with hypoxia: Secondary | ICD-10-CM | POA: Diagnosis not present

## 2023-08-09 DIAGNOSIS — D631 Anemia in chronic kidney disease: Secondary | ICD-10-CM | POA: Diagnosis not present

## 2023-08-09 DIAGNOSIS — I2510 Atherosclerotic heart disease of native coronary artery without angina pectoris: Secondary | ICD-10-CM | POA: Diagnosis not present

## 2023-08-09 DIAGNOSIS — N1832 Chronic kidney disease, stage 3b: Secondary | ICD-10-CM | POA: Diagnosis not present

## 2023-08-09 DIAGNOSIS — I110 Hypertensive heart disease with heart failure: Secondary | ICD-10-CM | POA: Diagnosis not present

## 2023-08-09 NOTE — Patient Instructions (Signed)
 Visit Information  Thank you for taking time to visit with me today. Please don't hesitate to contact me if I can be of assistance to you before our next scheduled telephone appointment.  Our next appointment is by telephone on 08/17/23 at 2pm  Following is a copy of your care plan:   Goals Addressed             This Visit's Progress    COMPLETED: VBCI Transitions of Care (TOC) Care Plan       Problems:  Recent Hospitalization for treatment of Atrial Fibrillation with RVR Patient reports feeling the same as he did when he left the hospital 07/16/23 Patient has appointment with cardiology 07/20/23 - patient had TEE cardioversion 07/27/23 and states he did not received an AVS with instructions Patient is scheduled for TEE Cardioversion 07/23/23 and was advised to hold his Jardiance , Mounjaro , and Metformin  - with medication reconcilation today 07/21/23 patient states he did not realize he was supposed to hold the Metformin  - conference call with patient was made to cardiology office at [phone]. Staff advised patient that the nurse will call him back - 07/22/23 Update: TOC reviewed record and saw note from today 07/22/23 as follows: by Janene Boer: I gave the patient his instruction, he need to be off of Mounjaro  for 7 days, off of Jardiance  for 3 days, hold metformin  the morning of the procedure. Patient took Monjarou last Sunday, spoke with Dr. Jeffrie and anesthesiologist, they are willing to do the procedure this Friday if needed, however patient may end up needing to be intubated.  He is on 2-1/2 L of oxygen  at baseline, I fear intubation may increase the risk of the procedure, after discussing with the patient, we decided to push the TEE DCCV back to next Tuesday morning.-TOC RN called and discussed with patient who verbalized understanding - TOC RN follow up call rescheduled to 6/11 - rescheduled to 6/12 after [facility] RN called 07/28/23 and patient states he didn't receive after visit summary or instructions -  we called office of Maude Emmer and left message asking them to send AVS with instructions to patient's My Chart  Goal: Goal not met - Patient readmitted to hospital  Over the next 30 days, the patient will not experience hospital readmission  Interventions:  Transitions of Care: Doctor Visits  - discussed the importance of doctor visits 5/30/25Per chart review-patient was seen 07/15/23 by PCP - record reflects, A recent urine dipstick test showed moderate leukocytes and large blood, and a urine culture has been sent to rule out ongoing infection - results not yet available. Patient reports during call today that symptoms have improved. Educated patient to call PCP office over the weekend if symptoms return and ask for on call MD to check for results and possible antibiotics if needed.  07/21/23 Update: TOC RN reviewed notes from Office visit 07/20/23 Cardio, [employer]  124/72  HR 123 Metoprolol  increased to 100mg  BID Amlodipine  dc'd - (TOC RN confirmed with patient) - TEE (Transesophageal Echocardiogram) Cardioversion - scheduled 6/6 w/Dr Jeffrie - The following was reviewed with patient - Please arrive at the [facility] (Main Entrance A) at [facility]: [address] [place], [place] [address] at 8:30 AM (This time is 1 hour(s) before your procedure to ensure your preparation). HOLD JARDIANCE  3 DAYS PRIOR    HOLD METFORMIN  3 DAYS PRIOR patient accidentally took this morning of 07/21/23 7am as he did not realize he was also supposed to hold his  Metformin - call placed to cardio office and nurse is to call patient back with instructions and patient will call TOC RN with questions/concerns    HOLD MOUNJARO  3 DAYS PRIOR - Patient takes on Sunday  Continue taking your anticoagulant (blood thinner): Rivaroxaban  (Xarelto )07/22/23 Update: TOC reviewed record and saw note from today 07/22/23 as follows: by Janene Boer: I gave the patient his instruction, he need to be off of Mounjaro  for 7 days, off of Jardiance   for 3 days, hold metformin  the morning of the procedure. Patient took Monjarou last Sunday, spoke with Dr. Jeffrie and anesthesiologist, they are willing to do the procedure this Friday if needed, however patient may end up needing to be intubated.  He is on 2-1/2 L of oxygen  at baseline, I fear intubation may increase the risk of the procedure, after discussing with the patient, we decided to push the TEE DCCV back to next Tuesday morning.-TOC RN called and discussed with patient who verbalized understanding - TOC RN follow up call rescheduled to 6/11 - rescheduled to 6/12 after [facility] RN called 07/28/23 and patient states he didn't receive after visit summary or instructions - we called office of Maude Emmer and left message asking them to send AVS with instructions to patient's My Chart TOC RN was unable to complete assessment questions related patient stating today is his daughter's birthday and people are there for celebration - he will speak with cardiology office about having taken the Metformin  this morning and agreed to [facility] follow up call Monday 07/26/23 10am - rescheduled for 07/28/23  - rescheduled to 6/12 after [facility] RN called 07/28/23 and patient states he didn't receive after visit summary or instructions - we called office of Maude Emmer and left message asking them to send AVS with instructions to patient's My Chart  AFIB Interventions:   Reviewed importance of adherence to anticoagulant exactly as prescribed Counseled on bleeding risk associated with Xarelto  and importance of self-monitoring for signs/symptoms of bleeding   Heart Failure Interventions: Provided education on low sodium diet Assessed need for readable accurate scales in home Provided education about placing scale on hard, flat surface Advised patient to weigh each morning after emptying bladder Discussed importance of daily weight and advised patient to weigh and record daily  Diabetes Interventions: Assessed patient's  understanding of A1c goal: <6.5% Reviewed medications with patient and discussed importance of medication adherence Discussed plans with patient for ongoing care management follow up and provided patient with direct contact information for care management team Reviewed scheduled/upcoming provider appointments including: PCP Hospital follow up 07/15/23 Assessed social determinant of health barriers Lab Results  Component Value Date   HGBA1C 5.5 05/24/2023    Patient Self Care Activities:  Attend all scheduled provider appointments Call pharmacy for medication refills 3-7 days in advance of running out of medications Call provider office for new concerns or questions  Notify RN Care Manager of TOC call rescheduling needs Participate in Transition of Care Program/Attend TOC scheduled calls Take medications as prescribed   call office if I gain more than 2 pounds in one day or 5 pounds in one week use salt in moderation watch for swelling in feet, ankles and legs every day weigh myself daily take the blood sugar log to all doctor visits make a plan to eat healthy take medicine as prescribed  Plan:  Goal not met - Patient readmitted to hospital  The patient has been provided with contact information for the care management team and has been advised  to call with any health related questions or concerns.      VBCI Transitions of Care (TOC) Care Plan       Problems:  Recent Hospitalization for treatment of Heart Failure with reduced ejection fraction Knowledge Deficit Related to oxygen  therapy - patient states the only concentrator he has is a small one - Patient states he is calling [employer] to see about getting a larger unit. Conference call was made to [employer] at [phone]. We spoke with [employer] who states previously when tech was at the home patient declined the home concentrator saying it was too big to carry. TOC RN requested they return to home with a home concentrator and to make sure  patient has the tubing needed and that education is provided - Emmie states patient currently has TOC (Total oxygen  concentrator) that is usually used to get patients home from the hospital and then the floor concentrator is left in the home. TOC RN requested that they send a tech out who will patiently explain the concentrator and oxygen  therapy - Emmie states someone will call patient to schedule  Goal:  Over the next 30 days, the patient will not experience hospital readmission  Interventions:  Transitions of Care: Durable Medical Equipment (DME) needs assessed with patient/caregiver, options discussed with patient/caregiver, and education provided Doctor Visits  - discussed the importance of doctor visits Arranged PCP follow-up within 7 days (Care Guide Scheduled) Hospital follow up scheduled at [facility] with Medina-Vargas, Monina C, NP Arrive by 8:25 AMAppt at 8:40 AM (20 min)    Contacted DME company for patient use of equipment - [employer] at [phone] Reviewed cardiology appointment scheduled with Hao Meng 08/26/23 8:25am - Patient had TEE cardioversion 07/27/23 converted to NSR and is back in Afib Reviewed patient's suprapubic catheter (s/p prostate cancer with seed implant 5 years ago) changed every 30 days by nurse in urology office - was changed 08/03/23 and was found to be hypoxic in the 80s while on 4 liters oxygen  and was sent to ED Patient treated for UTI and reports last dose of Keflex  is today 08/09/23 Discussed patient needs battery for glucometer   AFIB Interventions:   Counseled on increased risk of stroke due to Afib and benefits of anticoagulation for stroke prevention Counseled on bleeding risk associated with Xarelto  and importance of self-monitoring for signs/symptoms of bleeding Assessed social determinant of health barriers   Heart Failure Interventions: Provided education on low sodium diet Reviewed role of diuretics in prevention of fluid overload and  management of heart failure; Discussed the importance of keeping all appointments with provider  Patient Self Care Activities:  Attend all scheduled provider appointments Call pharmacy for medication refills 3-7 days in advance of running out of medications Call provider office for new concerns or questions  Notify RN Care Manager of TOC call rescheduling needs Participate in Transition of Care Program/Attend TOC scheduled calls Take medications as prescribed   call office if I gain more than 2 pounds in one day or 5 pounds in one week use salt in moderation weigh myself daily know when to call the doctor:weight gain as stated, shortness of breath, swelling In legs/feet, hands, abdomen track symptoms and what helps feel better or worse check pulse (heart) rate once a day make a plan to eat healthy take medicine as prescribed  Plan:  Telephone follow up appointment with care management team member scheduled for:  08/17/23 2pm with covering [facility] RN Heron Edison The patient has been provided  with contact information for the care management team and has been advised to call with any health related questions or concerns.         Patient verbalizes understanding of instructions and care plan provided today and agrees to view in MyChart. Active MyChart status and patient understanding of how to access instructions and care plan via MyChart confirmed with patient.     Telephone follow up appointment with care management team member scheduled for:08/17/23 2pm with covering [facility] RN Heron Edison [facility]) The patient has been provided with contact information for the care management team and has been advised to call with any health related questions or concerns.   Please call the care guide team at [phone] if you need to cancel or reschedule your appointment.   Please call the Suicide and Crisis Lifeline: 988 call 1-800-273-TALK (toll free, 24 hour hotline) call 911 if you are experiencing a  Mental Health or Behavioral Health Crisis or need someone to talk to.  Shona Prow RN, CCM [place]  VBCI-Population Health RN Care Manager [phone]

## 2023-08-09 NOTE — Transitions of Care (Post Inpatient/ED Visit) (Signed)
 08/09/2023  Name: Thomas [name] MRN: [id number] DOB: 12/27/1950  Today's TOC FU Call Status: Today's TOC FU Call Status:: Successful TOC FU Call Completed TOC FU Call Complete Date: 08/09/23 Patient's Name and Date of Birth confirmed.  Transition Care Management Follow-up Telephone Call Date of Discharge: 08/07/23 Discharge Facility: [facility]) Type of Discharge: Inpatient Admission Primary Inpatient Discharge Diagnosis:: Heart Failure with reduced ejection fraction How have you been since you were released from the hospital?: Better Any questions or concerns?: No  Items Reviewed: Did you receive and understand the discharge instructions provided?: Yes Medications obtained,verified, and reconciled?: Yes (Medications Reviewed) Any new allergies since your discharge?: No Dietary orders reviewed?: Yes Type of Diet Ordered:: Heart healthy, diabetic Do you have support at home?: Yes People in Home [RPT]: spouse Name of Support/Comfort Primary Source: supportive wife at home  Medications Reviewed Today: Medications Reviewed Today     Reviewed by Lauro Shona LABOR, RN (Registered Nurse) on 08/09/23 at 1443  Med List Status: <None>   Medication Order Taking? Sig Documenting Provider Last Dose Status Informant  Accu-Chek Softclix Lancets lancets [id number] Yes Use to test blood sugar daily [name], NP  Active Self, Pharmacy Records  Alcohol  Swabs (B-D SINGLE USE SWABS REGULAR) PADS [id number] Yes Use in testing blood sugar daily. Dx: E11.22 [name], NP  Active Self, Pharmacy Records  aspirin  EC 81 MG tablet [id number] Yes Take 81 mg by mouth daily. [provider]  Active Self, Pharmacy Records  atorvastatin  (LIPITOR) 40 MG tablet [id number] Yes TAKE 1 TABLET EVERY DAY [name], Jessica K, NP  Active Self, Pharmacy Records           Med Note [name] [name] CROME   Dju Jul 03, 2023  2:32 PM) LF: 05/13/23 for a 90ds  Blood Glucose Calibration  (ACCU-CHEK AVIVA) SOLN [id number] Yes Use once daily as directed dx E11.22 [name], Jessica K, NP  Active Self, Pharmacy Records  Blood Glucose Monitoring Suppl (TRUE METRIX AIR GLUCOSE METER) w/Device KIT [id number] Yes 1 Device by Does not apply route daily as needed. E11.22 [name], NP  Active Self, Pharmacy Records  calcium  carbonate (TUMS - DOSED IN MG ELEMENTAL CALCIUM ) 500 MG chewable tablet [id number] Yes Chew 2 tablets by mouth as needed for indigestion or heartburn. [provider]  Active Self, Pharmacy Records  cephALEXin  (KEFLEX ) 250 MG capsule [id number] Yes Take 1 capsule (250 mg total) by mouth 3 (three) times daily for 2 days. Fairy Frames, MD  Active   Cholecalciferol  (VITAMIN D3) 50 MCG (2000 UT) TABS [id number] Yes Take 1 tablet (2,000 Units) by mouth daily.   Active Self, Pharmacy Records  furosemide  (LASIX ) 20 MG tablet [id number] Yes Take 2 tablets (40 mg total) by mouth daily. Fairy Frames, MD  Active   glucose blood (ACCU-CHEK AVIVA PLUS) test strip [id number] Yes Use to test blood sugar daily. [name], NP  Active Self, Pharmacy Records  hydrALAZINE  (APRESOLINE ) 25 MG tablet [id number] Yes TAKE 3 TABLETS THREE TIMES DAILY [name], Jessica K, NP  Active Self, Pharmacy Records           Med Note WORLEY, [facility] M   Thu Jul 15, 2023 11:05 AM)    isosorbide  mononitrate (IMDUR ) 30 MG 24 hr tablet [id number] Yes Take 1 tablet (30 mg total) by mouth daily. Fairy Frames, MD  Active   Lancets Misc. (ACCU-CHEK SOFTCLIX LANCET DEV) KIT [id number] Yes Use to test blood sugar daily [name],  NP  Active Self, Pharmacy Records  losartan  (COZAAR ) 50 MG tablet [id number] Yes Take 0.5 tablets (25 mg total) by mouth daily. Fairy Frames, MD  Active   metFORMIN  (GLUCOPHAGE ) 500 MG tablet [id number] Yes TAKE 1 TABLET TWICE DAILY WITH MEALS [name], Jessica K, NP  Active Self, Pharmacy Records           Med Note (SATTERFIELD, TEENA BRAVO   Tue Aug 03, 2023   5:38 PM)    metoprolol  tartrate (LOPRESSOR ) 100 MG tablet [id number] Yes Take 1 tablet (100 mg total) by mouth 2 (two) times daily. Meng, Hao, [place]  Active   Multiple Vitamin (MULTIVITAMIN ADULT PO) [id number] Yes Take 2 each by mouth daily as needed (immune system). [provider]  Active Self, Pharmacy Records  potassium chloride  SA (KLOR-CON  M) 20 MEQ tablet [id number] Yes TAKE 1 TABLET TWICE DAILY [name], Jessica K, NP  Active Self, Pharmacy Records           Med Note (SATTERFIELD, TEENA BRAVO   Tue Aug 03, 2023  5:39 PM)    rivaroxaban  (XARELTO ) 20 MG TABS tablet [id number] Yes Take 1 tablet (20 mg total) by mouth daily with supper. Pokhrel, Laxman, MD  Active            Med Note (SATTERFIELD, TEENA BRAVO   Tue Aug 03, 2023  5:39 PM)    Senna 8.7 MG CHEW [id number] Yes Chew 1 tablet by mouth as needed. [name], NP  Active Self, Pharmacy Records  spironolactone  (ALDACTONE ) 25 MG tablet [id number] Yes Take 1 tablet (25 mg total) by mouth daily. [name], NP  Active   tirzepatide  (MOUNJARO ) 15 MG/0.5ML Pen [id number] Yes Inject 15 mg into the skin once a week. Santo Stanly LABOR, MD  Active            Med Note (SATTERFIELD, TEENA BRAVO   Tue Aug 03, 2023  5:36 PM) Take on sundays  TRUEplus Lancets 28G MISC [id number] Yes TEST BLOOD SUGAR EVERY DAY AS NEEDED [name], NP  Active Self, Pharmacy Records  zinc  gluconate 50 MG tablet [id number] Yes Take 1 tablet (50 mg total) by mouth daily. Ngetich, Roxan BROCKS, NP  Active Self, Pharmacy Records            Home Care and Equipment/Supplies: Were Home Health Services Ordered?: Yes Name of Home Health Agency:: [facility] Has Agency set up a time to come to your home?: Yes First Home Health Visit Date: 08/09/23 Any new equipment or medical supplies ordered?: No (No new supplies were ordered but call was made to [employer] because patient states he does not have large concentrator in the home and [place] with [employer] states patient  only has TOC and states they will call him today to schedule)  Functional Questionnaire: Do you need assistance with bathing/showering or dressing?: No Do you need assistance with meal preparation?: No Do you need assistance with eating?: No Do you have difficulty maintaining continence: No Do you need assistance with getting out of bed/getting out of a chair/moving?: No Do you have difficulty managing or taking your medications?: Yes (Wife - daugther help with medications)  Follow up appointments reviewed: PCP Follow-up appointment confirmed?: Yes Date of PCP follow-up appointment?: 08/19/23 Follow-up Provider: Phyllis Jereld BROCKS, NP Specialist Hospital Follow-up appointment confirmed?: Yes Date of Specialist follow-up appointment?: 08/26/23 Follow-Up Specialty Provider:: [facility] cardiology Do you need transportation to your follow-up appointment?: No Do you understand care options if your condition(s) worsen?:  Yes-patient verbalized understanding  SDOH Interventions Today    Flowsheet Row Most Recent Value  SDOH Interventions   Food Insecurity Interventions Intervention Not Indicated  Housing Interventions Intervention Not Indicated  Transportation Interventions Intervention Not Indicated  Utilities Interventions Intervention Not Indicated    Goals Addressed             This Visit's Progress    COMPLETED: VBCI Transitions of Care (TOC) Care Plan       Problems:  Recent Hospitalization for treatment of Atrial Fibrillation with RVR Patient reports feeling the same as he did when he left the hospital 07/16/23 Patient has appointment with cardiology 07/20/23 - patient had TEE cardioversion 07/27/23 and states he did not received an AVS with instructions Patient is scheduled for TEE Cardioversion 07/23/23 and was advised to hold his Jardiance , Mounjaro , and Metformin  - with medication reconcilation today 07/21/23 patient states he did not realize he was supposed to hold the  Metformin  - conference call with patient was made to cardiology office at [phone]. Staff advised patient that the nurse will call him back - 07/22/23 Update: TOC reviewed record and saw note from today 07/22/23 as follows: by Janene Boer: I gave the patient his instruction, he need to be off of Mounjaro  for 7 days, off of Jardiance  for 3 days, hold metformin  the morning of the procedure. Patient took Monjarou last Sunday, spoke with Dr. Jeffrie and anesthesiologist, they are willing to do the procedure this Friday if needed, however patient may end up needing to be intubated.  He is on 2-1/2 L of oxygen  at baseline, I fear intubation may increase the risk of the procedure, after discussing with the patient, we decided to push the TEE DCCV back to next Tuesday morning.-TOC RN called and discussed with patient who verbalized understanding - TOC RN follow up call rescheduled to 6/11 - rescheduled to 6/12 after [facility] RN called 07/28/23 and patient states he didn't receive after visit summary or instructions - we called office of Maude Emmer and left message asking them to send AVS with instructions to patient's My Chart  Goal: Goal not met - Patient readmitted to hospital  Over the next 30 days, the patient will not experience hospital readmission  Interventions:  Transitions of Care: Doctor Visits  - discussed the importance of doctor visits 5/30/25Per chart review-patient was seen 07/15/23 by PCP - record reflects, A recent urine dipstick test showed moderate leukocytes and large blood, and a urine culture has been sent to rule out ongoing infection - results not yet available. Patient reports during call today that symptoms have improved. Educated patient to call PCP office over the weekend if symptoms return and ask for on call MD to check for results and possible antibiotics if needed.  07/21/23 Update: TOC RN reviewed notes from Office visit 07/20/23 Cardio, [employer]  124/72  HR 123 Metoprolol  increased to 100mg   BID Amlodipine  dc'd - (TOC RN confirmed with patient) - TEE (Transesophageal Echocardiogram) Cardioversion - scheduled 6/6 w/Dr Jeffrie - The following was reviewed with patient - Please arrive at the [facility] (Main Entrance A) at [facility]: [address] [place], [place] [address] at 8:30 AM (This time is 1 hour(s) before your procedure to ensure your preparation). HOLD JARDIANCE  3 DAYS PRIOR    HOLD METFORMIN  3 DAYS PRIOR patient accidentally took this morning of 07/21/23 7am as he did not realize he was also supposed to hold his Metformin - call placed to cardio office and  nurse is to call patient back with instructions and patient will call TOC RN with questions/concerns    HOLD MOUNJARO  3 DAYS PRIOR - Patient takes on Sunday  Continue taking your anticoagulant (blood thinner): Rivaroxaban  (Xarelto )07/22/23 Update: TOC reviewed record and saw note from today 07/22/23 as follows: by Janene Boer: I gave the patient his instruction, he need to be off of Mounjaro  for 7 days, off of Jardiance  for 3 days, hold metformin  the morning of the procedure. Patient took Monjarou last Sunday, spoke with Dr. Jeffrie and anesthesiologist, they are willing to do the procedure this Friday if needed, however patient may end up needing to be intubated.  He is on 2-1/2 L of oxygen  at baseline, I fear intubation may increase the risk of the procedure, after discussing with the patient, we decided to push the TEE DCCV back to next Tuesday morning.-TOC RN called and discussed with patient who verbalized understanding - TOC RN follow up call rescheduled to 6/11 - rescheduled to 6/12 after [facility] RN called 07/28/23 and patient states he didn't receive after visit summary or instructions - we called office of Maude Emmer and left message asking them to send AVS with instructions to patient's My Chart TOC RN was unable to complete assessment questions related patient stating today is his daughter's birthday and people are there for  celebration - he will speak with cardiology office about having taken the Metformin  this morning and agreed to [facility] follow up call Monday 07/26/23 10am - rescheduled for 07/28/23  - rescheduled to 6/12 after [facility] RN called 07/28/23 and patient states he didn't receive after visit summary or instructions - we called office of Maude Emmer and left message asking them to send AVS with instructions to patient's My Chart  AFIB Interventions:   Reviewed importance of adherence to anticoagulant exactly as prescribed Counseled on bleeding risk associated with Xarelto  and importance of self-monitoring for signs/symptoms of bleeding   Heart Failure Interventions: Provided education on low sodium diet Assessed need for readable accurate scales in home Provided education about placing scale on hard, flat surface Advised patient to weigh each morning after emptying bladder Discussed importance of daily weight and advised patient to weigh and record daily  Diabetes Interventions: Assessed patient's understanding of A1c goal: <6.5% Reviewed medications with patient and discussed importance of medication adherence Discussed plans with patient for ongoing care management follow up and provided patient with direct contact information for care management team Reviewed scheduled/upcoming provider appointments including: PCP Hospital follow up 07/15/23 Assessed social determinant of health barriers Lab Results  Component Value Date   HGBA1C 5.5 05/24/2023    Patient Self Care Activities:  Attend all scheduled provider appointments Call pharmacy for medication refills 3-7 days in advance of running out of medications Call provider office for new concerns or questions  Notify RN Care Manager of TOC call rescheduling needs Participate in Transition of Care Program/Attend TOC scheduled calls Take medications as prescribed   call office if I gain more than 2 pounds in one day or 5 pounds in one week use salt in  moderation watch for swelling in feet, ankles and legs every day weigh myself daily take the blood sugar log to all doctor visits make a plan to eat healthy take medicine as prescribed  Plan:  Goal not met - Patient readmitted to hospital  The patient has been provided with contact information for the care management team and has been advised to call with any health related questions  or concerns.      VBCI Transitions of Care (TOC) Care Plan       Problems:  Recent Hospitalization for treatment of Heart Failure with reduced ejection fraction Knowledge Deficit Related to oxygen  therapy - patient states the only concentrator he has is a small one - Patient states he is calling [employer] to see about getting a larger unit. Conference call was made to [employer] at [phone]. We spoke with [employer] who states previously when tech was at the home patient declined the home concentrator saying it was too big to carry. TOC RN requested they return to home with a home concentrator and to make sure patient has the tubing needed and that education is provided - Emmie states patient currently has TOC (Total oxygen  concentrator) that is usually used to get patients home from the hospital and then the floor concentrator is left in the home. TOC RN requested that they send a tech out who will patiently explain the concentrator and oxygen  therapy - Emmie states someone will call patient to schedule  Goal:  Over the next 30 days, the patient will not experience hospital readmission  Interventions:  Transitions of Care: Durable Medical Equipment (DME) needs assessed with patient/caregiver, options discussed with patient/caregiver, and education provided Doctor Visits  - discussed the importance of doctor visits Arranged PCP follow-up within 7 days (Care Guide Scheduled) Hospital follow up scheduled at [facility] with Medina-Vargas, Monina C, NP Arrive by 8:25 AMAppt at 8:40 AM (20 min)    Contacted DME company  for patient use of equipment - [employer] at [phone] Reviewed cardiology appointment scheduled with Hao Meng 08/26/23 8:25am - Patient had TEE cardioversion 07/27/23 converted to NSR and is back in Afib Reviewed patient's suprapubic catheter (s/p prostate cancer with seed implant 5 years ago) changed every 30 days by nurse in urology office - was changed 08/03/23 and was found to be hypoxic in the 80s while on 4 liters oxygen  and was sent to ED Patient treated for UTI and reports last dose of Keflex  is today 08/09/23 Discussed patient needs battery for glucometer   AFIB Interventions:   Counseled on increased risk of stroke due to Afib and benefits of anticoagulation for stroke prevention Counseled on bleeding risk associated with Xarelto  and importance of self-monitoring for signs/symptoms of bleeding Assessed social determinant of health barriers   Heart Failure Interventions: Provided education on low sodium diet Reviewed role of diuretics in prevention of fluid overload and management of heart failure; Discussed the importance of keeping all appointments with provider  Patient Self Care Activities:  Attend all scheduled provider appointments Call pharmacy for medication refills 3-7 days in advance of running out of medications Call provider office for new concerns or questions  Notify RN Care Manager of TOC call rescheduling needs Participate in Transition of Care Program/Attend TOC scheduled calls Take medications as prescribed   call office if I gain more than 2 pounds in one day or 5 pounds in one week use salt in moderation weigh myself daily know when to call the doctor:weight gain as stated, shortness of breath, swelling In legs/feet, hands, abdomen track symptoms and what helps feel better or worse check pulse (heart) rate once a day make a plan to eat healthy take medicine as prescribed  Plan:  Telephone follow up appointment with care management team member scheduled for:   08/17/23 2pm with covering [facility] RN Heron Edison The patient has been provided with contact information for the care management  team and has been advised to call with any health related questions or concerns.          Shona Prow RN, CCM [place]  VBCI-Population Health RN Care Manager [phone]

## 2023-08-10 ENCOUNTER — Telehealth: Payer: Self-pay | Admitting: *Deleted

## 2023-08-10 NOTE — Telephone Encounter (Signed)
 Copied from CRM [id number]. Topic: Clinical - Home Health Verbal Orders >> Aug 09, 2023  4:50 PM Adrianna P wrote: Caller/Agency: ivie Rushing Number: [phone] Service Requested: Physical Therapy Frequency: 1 week 8 Any new concerns about the patient? Yes. Supplemental oxygen   93% with exertion drops to 83 percent. Heart rate irregular, memory declining      Called and Verbal orders Given.  Forwarded to [name], NP for FYI

## 2023-08-10 NOTE — Telephone Encounter (Signed)
 Noted thank you

## 2023-08-10 NOTE — Telephone Encounter (Signed)
 Noted he has hospital follow up scheduled. If needed can call for sooner appt.

## 2023-08-10 NOTE — Telephone Encounter (Signed)
 Copied from CRM [id number]. Topic: Clinical - Medical Advice >> Aug 10, 2023  8:56 AM Laurier C wrote: Reason for CRM: Level 2 medication was flagged: potassium chloride  SA (KLOR-CON  M) 20 MEQ tablet spironolactone  (ALDACTONE ) 25 MG tablet Provider does not need to respond unless medications are being changed Signe Eastern (PT with [facility]) can be reached at [phone]

## 2023-08-11 ENCOUNTER — Other Ambulatory Visit (HOSPITAL_COMMUNITY): Payer: Self-pay

## 2023-08-13 ENCOUNTER — Ambulatory Visit (HOSPITAL_COMMUNITY)
Admission: RE | Admit: 2023-08-13 | Discharge: 2023-08-13 | Disposition: A | Discharge status: Home / Self Care | Source: Ambulatory Visit | Attending: Physician Assistant | Admitting: Physician Assistant

## 2023-08-13 VITALS — BP 120/62 | HR 89 | Ht 69.0 in | Wt 303.0 lb

## 2023-08-13 DIAGNOSIS — I4819 Other persistent atrial fibrillation: Secondary | ICD-10-CM | POA: Diagnosis not present

## 2023-08-13 DIAGNOSIS — I4891 Unspecified atrial fibrillation: Secondary | ICD-10-CM | POA: Diagnosis not present

## 2023-08-13 DIAGNOSIS — R339 Retention of urine, unspecified: Secondary | ICD-10-CM | POA: Diagnosis not present

## 2023-08-13 DIAGNOSIS — D6869 Other thrombophilia: Secondary | ICD-10-CM

## 2023-08-13 NOTE — Progress Notes (Signed)
 Primary Care Physician: [name], NP Primary Cardiologist: Madonna Large, DO Electrophysiologist: Eulas FORBES Furbish, MD  Referring Physician: Dr Large Leonor MALVA Sharron is a 73 y.o. male with a history of CHF, DVT/PE 2018, HTN, DM, CAD, OSA, chronic respiratory failure, prostate cancer, atrial fibrillation who presents for follow up in the [facility].  The patient was admitted 07/02/23 with tachypalpitations. He was diuresed and started on Lopressor  for rate control. He underwent DCCV on 07/27/23. He was admitted again 08/03/23 for acute CHF. Patient was in the process of being discharged but reverted back to atrial fibrillation 08/06/2023. This was in the setting of a UTI. Patient is on Xarelto  for stroke prevention.    Patient presents today for follow up for atrial fibrillation. He remains in rate controlled afib today with symptoms of palpitations. He reports he is still at his dry weight. No bleeding issues on anticoagulation.   Today, he denies symptoms of chest pain, orthopnea, PND, lower extremity edema, dizziness, presyncope, syncope, bleeding, or neurologic sequela. The patient is tolerating medications without difficulties and is otherwise without complaint today.    Atrial Fibrillation Risk Factors:  he does have symptoms or diagnosis of sleep apnea. he does not have a history of rheumatic fever.   Atrial Fibrillation Management history:  Previous antiarrhythmic drugs: none Previous cardioversions: 07/27/23 Previous ablations: none Anticoagulation history: Xarelto    ROS- All systems are reviewed and negative except as per the HPI above.  Past Medical History:  Diagnosis Date   Acute on chronic diastolic CHF (congestive heart failure) (HCC) 06/01/2017   Arthritis    Cancer (HCC) 2010   Prostate   Chronic kidney disease    Diabetes mellitus    Diabetic neuropathy (HCC)    feet   DVT (deep venous thrombosis) (HCC)    Genetic  testing 06/22/2016   Mr. Salminen underwent genetic counseling and testing for hereditary cancer syndromes on 05/14/2016. His results were negative for mutations in all 46 genes analyzed by [facility]'s 46-gene Common Hereditary Cancers Panel. Genes analyzed include: APC, ATM, AXIN2, BARD1, BMPR1A, BRCA1, BRCA2, BRIP1, CDH1, CDKN2A, CHEK2, CTNNA1, DICER1, EPCAM, GREM1, HOXB13, KIT, MEN1, MLH1, MSH2, MSH3, MSH6, MUTYH, NB   GERD (gastroesophageal reflux disease)    Hypertension    PE (pulmonary thromboembolism) (HCC)    Pneumonia    Sleep apnea    not wearing CPAP   Suprapubic catheter (HCC) 11/17/2019    Current Outpatient Medications  Medication Sig Dispense Refill   Accu-Chek Softclix Lancets lancets Use to test blood sugar daily 200 each 3   Alcohol  Swabs (B-D SINGLE USE SWABS REGULAR) PADS Use in testing blood sugar daily. Dx: E11.22 100 each 3   aspirin  EC 81 MG tablet Take 81 mg by mouth daily.     atorvastatin  (LIPITOR) 40 MG tablet TAKE 1 TABLET EVERY DAY 90 tablet 3   Blood Glucose Calibration (ACCU-CHEK AVIVA) SOLN Use once daily as directed dx E11.22 1 each 2   Blood Glucose Monitoring Suppl (TRUE METRIX AIR GLUCOSE METER) w/Device KIT 1 Device by Does not apply route daily as needed. E11.22 1 kit 0   calcium  carbonate (TUMS - DOSED IN MG ELEMENTAL CALCIUM ) 500 MG chewable tablet Chew 2 tablets by mouth as needed for indigestion or heartburn.     Cholecalciferol  (VITAMIN D3) 50 MCG (2000 UT) TABS Take 1 tablet (2,000 Units) by mouth daily. 10 tablet 0   furosemide  (LASIX ) 20 MG tablet Take 2  tablets (40 mg total) by mouth daily. 30 tablet 1   glucose blood (ACCU-CHEK AVIVA PLUS) test strip Use to test blood sugar daily. 100 each 3   hydrALAZINE  (APRESOLINE ) 25 MG tablet TAKE 3 TABLETS THREE TIMES DAILY 810 tablet 3   isosorbide  mononitrate (IMDUR ) 30 MG 24 hr tablet Take 1 tablet (30 mg total) by mouth daily. 30 tablet 1   Lancets Misc. (ACCU-CHEK SOFTCLIX LANCET DEV) KIT Use to test  blood sugar daily 1 kit 0   losartan  (COZAAR ) 50 MG tablet Take 0.5 tablets (25 mg total) by mouth daily.     metFORMIN  (GLUCOPHAGE ) 500 MG tablet TAKE 1 TABLET TWICE DAILY WITH MEALS 180 tablet 3   metoprolol  tartrate (LOPRESSOR ) 100 MG tablet Take 1 tablet (100 mg total) by mouth 2 (two) times daily. 60 tablet 11   Multiple Vitamin (MULTIVITAMIN ADULT PO) Take 2 each by mouth daily as needed (immune system). (Patient taking differently: Take 2 each by mouth every morning.)     potassium chloride  SA (KLOR-CON  M) 20 MEQ tablet TAKE 1 TABLET TWICE DAILY 180 tablet 3   rivaroxaban  (XARELTO ) 20 MG TABS tablet Take 1 tablet (20 mg total) by mouth daily with supper. 30 tablet 12   Senna 8.7 MG CHEW Chew 1 tablet by mouth as needed. 90 tablet 1   spironolactone  (ALDACTONE ) 25 MG tablet Take 1 tablet (25 mg total) by mouth daily.     tirzepatide  (MOUNJARO ) 15 MG/0.5ML Pen Inject 15 mg into the skin once a week. 2 mL 5   TRUEplus Lancets 28G MISC TEST BLOOD SUGAR EVERY DAY AS NEEDED 100 each 3   zinc  gluconate 50 MG tablet Take 1 tablet (50 mg total) by mouth daily. 14 tablet 0   No current facility-administered medications for this encounter.    Physical Exam: BP 120/62   Pulse 89   Ht 5' 9 (1.753 m)   Wt (!) 137.4 kg   BMI 44.75 kg/m   GEN: Well nourished, well developed in no acute distress NECK: No JVD CARDIAC: Irregularly irregular rate and rhythm, no murmurs, rubs, gallops RESPIRATORY:  diminished breath sounds ABDOMEN: Soft, non-tender, non-distended EXTREMITIES:  No edema; No deformity   Wt Readings from Last 3 Encounters:  08/13/23 (!) 137.4 kg  08/07/23 (!) 136.8 kg  08/02/23 (!) 144.8 kg     EKG today demonstrates  Afib Vent. rate 89 BPM PR interval * ms QRS duration 90 ms QT/QTcB 392/476 ms   Echo 07/04/23 demonstrated  1. Poor Echo images despite Definity .   2. Left ventricular ejection fraction, by estimation, is 40 to 45%. The  left ventricle has mildly  decreased function. The left ventricle  demonstrates global hypokinesis. Left ventricular diastolic function could  not be evaluated.   3. Right ventricular systolic function is moderately reduced. The right  ventricular size is normal.   4. The mitral valve is grossly normal. No evidence of mitral valve  regurgitation. No evidence of mitral stenosis.   5. The aortic valve was not well visualized. Aortic valve regurgitation  is not visualized. No aortic stenosis is present.   Comparison(s): Changes from prior study are noted. LVEF worsened from  45-50% to 40-45%.    CHA2DS2-VASc Score = 5  The patient's score is based upon: CHF History: 1 HTN History: 1 Diabetes History: 1 Stroke History: 0 Vascular Disease History: 1 Age Score: 1 Gender Score: 0       ASSESSMENT AND PLAN: Persistent Atrial Fibrillation (ICD10:  I48.19) The patient's CHA2DS2-VASc score is 5, indicating a 7.2% annual risk of stroke.   S/p DCCV 07/27/23 with quick return of afib.  We discussed rhythm control options today. Likely not a candidate for ablation with chronic O2 use. Would avoid class IC and Multaq with CHF. QT interval appears too long for sotalol or dofetilide. Amiodarone may be is best option for AAD despite his baseline lung disease however patient understandably does not want to take any medication that could affect his lung function. Ultimately, he may decide on rate control only. Will d/w EP.  For now, will arrange for repeat DCCV alone since his quick return of afib could be secondary to UTI/CHF. Patient in agreement with plan.  Continue Lopressor  100 mg BID Continue Xarelto  20 mg daily  Secondary Hypercoagulable State (ICD10:  D68.69) The patient is at significant risk for stroke/thromboembolism based upon his CHA2DS2-VASc Score of 5.  Continue Rivaroxaban  (Xarelto ). No bleeding issues.   HTN Stable on current regimen  OSA  Encouraged nightly CPAP  HFmrEF EF 40-45% GDMT per primary  cardiology team Fluid status appears stable today    Follow up in the AF clinic post DCCV.    Informed Consent   Shared Decision Making/Informed Consent The risks (stroke, cardiac arrhythmias rarely resulting in the need for a temporary or permanent pacemaker, skin irritation or burns and complications associated with conscious sedation including aspiration, arrhythmia, respiratory failure and death), benefits (restoration of normal sinus rhythm) and alternatives of a direct current cardioversion were explained in detail to Mr. Pallas and he agrees to proceed.         [facility] [address] [place], [place] [address] [phone]

## 2023-08-13 NOTE — Patient Instructions (Signed)
 Do not take Manjauro Sunday before procedure you can resume after it  Cardioversion scheduled for:   - Arrive at the Main Entrance A of [facility] ([address])  and check in with ADMITTING at   - Do not eat or drink anything after midnight the night prior to your procedure.   - Take all your morning medication (except diabetic medications) with a sip of water prior to arrival.  - Do NOT miss any doses of your blood thinner - if you should miss a dose or take a dose more than 4 hours late -- please notify our office immediately.  - You will not be able to drive home after your procedure. Please ensure you have a responsible adult to drive you home. You will need someone with you for 24 hours post procedure.     - Expect to be in the procedural area approximately 2 hours.   - If you feel as if you go back into normal rhythm prior to scheduled cardioversion, please notify our office immediately.   If your procedure is canceled in the cardioversion suite you will be charged a cancellation fee.    Hold below medications 7 days prior to scheduled procedure/anesthesia.  Restart medication on the normal dosing day after scheduled procedure/anesthesia   Tirzepatide  (Mounjaro ) (Zebound)

## 2023-08-13 NOTE — Progress Notes (Signed)
 Pt called for pre procedure instructions. Arrival time 1030 NPO after midnight explained Instructed to take am meds with sip of water and confirmed blood thinner consistency, Xarelto . Instructed pt need for ride home tomorrow and have responsible adult with them for 24 hrs post procedure.

## 2023-08-16 ENCOUNTER — Ambulatory Visit (HOSPITAL_COMMUNITY): Admitting: Anesthesiology

## 2023-08-16 ENCOUNTER — Encounter (HOSPITAL_COMMUNITY): Payer: Self-pay | Admitting: Internal Medicine

## 2023-08-16 ENCOUNTER — Other Ambulatory Visit: Payer: Self-pay

## 2023-08-16 ENCOUNTER — Telehealth: Payer: Self-pay

## 2023-08-16 ENCOUNTER — Other Ambulatory Visit (HOSPITAL_COMMUNITY): Payer: Self-pay

## 2023-08-16 ENCOUNTER — Encounter (HOSPITAL_COMMUNITY)
Admission: RE | Disposition: A | Discharge status: Home / Self Care | Payer: Self-pay | Source: Home / Self Care | Attending: Internal Medicine

## 2023-08-16 ENCOUNTER — Ambulatory Visit (HOSPITAL_COMMUNITY)
Admission: RE | Admit: 2023-08-16 | Discharge: 2023-08-16 | Disposition: A | Discharge status: Home / Self Care | Attending: Internal Medicine | Admitting: Internal Medicine

## 2023-08-16 ENCOUNTER — Other Ambulatory Visit: Payer: Self-pay | Admitting: Nurse Practitioner

## 2023-08-16 DIAGNOSIS — I130 Hypertensive heart and chronic kidney disease with heart failure and stage 1 through stage 4 chronic kidney disease, or unspecified chronic kidney disease: Secondary | ICD-10-CM | POA: Insufficient documentation

## 2023-08-16 DIAGNOSIS — Z86718 Personal history of other venous thrombosis and embolism: Secondary | ICD-10-CM | POA: Insufficient documentation

## 2023-08-16 DIAGNOSIS — Z79899 Other long term (current) drug therapy: Secondary | ICD-10-CM | POA: Insufficient documentation

## 2023-08-16 DIAGNOSIS — I4891 Unspecified atrial fibrillation: Secondary | ICD-10-CM | POA: Diagnosis not present

## 2023-08-16 DIAGNOSIS — I5032 Chronic diastolic (congestive) heart failure: Secondary | ICD-10-CM | POA: Insufficient documentation

## 2023-08-16 DIAGNOSIS — G4730 Sleep apnea, unspecified: Secondary | ICD-10-CM | POA: Diagnosis not present

## 2023-08-16 DIAGNOSIS — E1140 Type 2 diabetes mellitus with diabetic neuropathy, unspecified: Secondary | ICD-10-CM | POA: Insufficient documentation

## 2023-08-16 DIAGNOSIS — I2510 Atherosclerotic heart disease of native coronary artery without angina pectoris: Secondary | ICD-10-CM | POA: Insufficient documentation

## 2023-08-16 DIAGNOSIS — I5020 Unspecified systolic (congestive) heart failure: Secondary | ICD-10-CM

## 2023-08-16 DIAGNOSIS — J9611 Chronic respiratory failure with hypoxia: Secondary | ICD-10-CM | POA: Insufficient documentation

## 2023-08-16 DIAGNOSIS — E66813 Obesity, class 3: Secondary | ICD-10-CM | POA: Diagnosis not present

## 2023-08-16 DIAGNOSIS — Z86711 Personal history of pulmonary embolism: Secondary | ICD-10-CM | POA: Insufficient documentation

## 2023-08-16 DIAGNOSIS — Z6841 Body Mass Index (BMI) 40.0 and over, adult: Secondary | ICD-10-CM | POA: Diagnosis not present

## 2023-08-16 DIAGNOSIS — K219 Gastro-esophageal reflux disease without esophagitis: Secondary | ICD-10-CM | POA: Insufficient documentation

## 2023-08-16 DIAGNOSIS — Z7901 Long term (current) use of anticoagulants: Secondary | ICD-10-CM | POA: Insufficient documentation

## 2023-08-16 DIAGNOSIS — N189 Chronic kidney disease, unspecified: Secondary | ICD-10-CM | POA: Insufficient documentation

## 2023-08-16 DIAGNOSIS — I110 Hypertensive heart disease with heart failure: Secondary | ICD-10-CM | POA: Diagnosis not present

## 2023-08-16 DIAGNOSIS — Z9981 Dependence on supplemental oxygen: Secondary | ICD-10-CM | POA: Diagnosis not present

## 2023-08-16 DIAGNOSIS — E1122 Type 2 diabetes mellitus with diabetic chronic kidney disease: Secondary | ICD-10-CM | POA: Diagnosis not present

## 2023-08-16 DIAGNOSIS — I4819 Other persistent atrial fibrillation: Secondary | ICD-10-CM | POA: Diagnosis not present

## 2023-08-16 HISTORY — PX: CARDIOVERSION: EP1203

## 2023-08-16 LAB — GLUCOSE, CAPILLARY: Glucose-Capillary: 145 mg/dL — ABNORMAL HIGH (ref 70–99)

## 2023-08-16 SURGERY — CARDIOVERSION (CATH LAB)
Anesthesia: General

## 2023-08-16 MED ORDER — LIDOCAINE 2% (20 MG/ML) 5 ML SYRINGE
INTRAMUSCULAR | Status: DC | PRN
  Administered 2023-08-16: 100 mg via INTRAVENOUS

## 2023-08-16 MED ORDER — ACCU-CHEK AVIVA PLUS VI STRP
ORAL_STRIP | 3 refills | Status: DC
  Filled 2023-08-16: qty 100, 90d supply, fill #0

## 2023-08-16 MED ORDER — SODIUM CHLORIDE 0.9% FLUSH: 3.0000 mL | Freq: Two times a day (BID) | INTRAVENOUS | Status: DC

## 2023-08-16 MED ORDER — SODIUM CHLORIDE 0.9% FLUSH: 3.0000 mL | INTRAVENOUS | Status: DC | PRN

## 2023-08-16 MED ORDER — PROPOFOL 10 MG/ML IV BOLUS
INTRAVENOUS | Status: DC | PRN
  Administered 2023-08-16: 80 mg via INTRAVENOUS

## 2023-08-16 MED ORDER — SODIUM CHLORIDE 0.9% FLUSH
INTRAVENOUS | Status: DC | PRN
  Administered 2023-08-16: 10 mL via INTRAVENOUS

## 2023-08-16 SURGICAL SUPPLY — 1 items: PAD DEFIB RADIO PHYSIO CONN (PAD) ×2 IMPLANT

## 2023-08-16 NOTE — Anesthesia Postprocedure Evaluation (Signed)
 Anesthesia Post Note  Patient: Thomas [name]  Procedure(s) Performed: CARDIOVERSION     Patient location during evaluation: PACU Anesthesia Type: General Level of consciousness: awake and alert Pain management: pain level controlled Vital Signs Assessment: post-procedure vital signs reviewed and stable Respiratory status: spontaneous breathing, nonlabored ventilation and respiratory function stable Cardiovascular status: blood pressure returned to baseline and stable Postop Assessment: no apparent nausea or vomiting Anesthetic complications: no   No notable events documented.  Last Vitals:  Vitals:   08/16/23 1250 08/16/23 1258  BP: (!) 136/91 134/80  Pulse: (!) 58   Resp: 14   Temp:    SpO2: 93%     Last Pain:  Vitals:   08/16/23 1220  TempSrc:   PainSc: 0-No pain                 Butler Levander Pinal

## 2023-08-16 NOTE — Interval H&P Note (Signed)
 History and Physical Interval Note:  08/16/2023 10:46 AM  Thomas [name]  has presented today for surgery, with the diagnosis of AFIB.  The various methods of treatment have been discussed with the patient and family. After consideration of risks, benefits and other options for treatment, the patient has consented to  Procedure(s): CARDIOVERSION (N/A) as a surgical intervention.  The patient's history has been reviewed, patient examined, no change in status, stable for surgery.  I have reviewed the patient's chart and labs.  Questions were answered to the patient's satisfaction.  Discussed his new hypoxic respiratory failure 02 supplementation PRN to 2 L to 3 L, managed by pulmonology.  Discussed risks of hypoxic respiratory failure.  Patient is aware of these risks and amenable to proceeding in hopes that optimizing his rhythm will take the cardiac contributors of his disease out of play.    [name] A Aidyn Sportsman

## 2023-08-16 NOTE — Transfer of Care (Signed)
 Immediate Anesthesia Transfer of Care Note  Patient: Thomas [name]  Procedure(s) Performed: CARDIOVERSION  Patient Location: PACU and Cath Lab  Anesthesia Type:MAC  Level of Consciousness: drowsy  Airway & Oxygen  Therapy: Patient Spontanous Breathing and Patient connected to face mask oxygen   Post-op Assessment: Report given to RN and Post -op Vital signs reviewed and stable  Post vital signs: Reviewed and stable  Last Vitals:  Vitals Value Taken Time  BP 94/64 08/16/23 12:15  Temp    Pulse 67 08/16/23 12:15  Resp 21 08/16/23 12:11  SpO2 95 % 08/16/23 12:15    Last Pain:  Vitals:   08/16/23 1146  TempSrc:   PainSc: 0-No pain         Complications: No notable events documented.

## 2023-08-16 NOTE — Anesthesia Preprocedure Evaluation (Signed)
 Anesthesia Evaluation  Patient identified by MRN, date of birth, ID band Patient awake    Reviewed: Allergy & Precautions, NPO status , Patient's Chart, lab work & pertinent test results, reviewed documented beta blocker date and time   Airway Mallampati: I  TM Distance: >3 FB     Dental no notable dental hx. (+) Missing, Dental Advisory Given   Pulmonary shortness of breath, with exertion and Long-Term Oxygen  Therapy, sleep apnea , pneumonia, resolved, PE Non compliant with CPAP   breath sounds clear to auscultation + decreased breath sounds      Cardiovascular hypertension, Pt. on medications and Pt. on home beta blockers pulmonary hypertension+ CAD, +CHF and + DVT  + dysrhythmias Atrial Fibrillation  Rhythm:Irregular Rate:Normal  EKG 07/20/23 Atrial fibrillation with rapid ventricular response with premature ventricular or aberrantly conducted complexes Abnormal QRS-T angle, consider primary T wave abnormality  Echo 07/04/23 1. Poor Echo images despite Definity .   2. Left ventricular ejection fraction, by estimation, is 40 to 45%. The  left ventricle has mildly decreased function. The left ventricle  demonstrates global hypokinesis. Left ventricular diastolic function could  not be evaluated.   3. Right ventricular systolic function is moderately reduced. The right  ventricular size is normal.   4. The mitral valve is grossly normal. No evidence of mitral valve  regurgitation. No evidence of mitral stenosis.   5. The aortic valve was not well visualized. Aortic valve regurgitation  is not visualized. No aortic stenosis is present.   Cardiac Cath 07/06/17  There is mild left ventricular systolic dysfunction.  LV end diastolic pressure is moderately elevated, 30 mm Hg.  The left ventricular ejection fraction is 45-50% by visual estimate.  There is no aortic valve stenosis.  Hemodynamic findings consistent with moderate to  severe pulmonary hypertension.  CO 10.8 L/min; CI 4.2, Ao sat 91%; PA sat 72%; mean PCWP 21 mm Hg  Nonobstructive CAD.  Prox Cx lesion is 25% stenosed.   Non-obstructive CAD.  Moderate to severe pulmonary hypertension.      Neuro/Psych Diabetic neuropathy  negative psych ROS   GI/Hepatic Neg liver ROS,GERD  Medicated,,  Endo/Other  diabetes, Well Controlled, Type 2, Oral Hypoglycemic Agents  Class 3 obesityGLP-1 RA therapy- last dose Hyperlipidemia  Renal/GU Renal InsufficiencyRenal disease Bladder dysfunction  Hx/o urethral stricture with urinary retention Hx/o prostate Ca S/P radioactive seeds    Musculoskeletal  (+) Arthritis , Osteoarthritis,    Abdominal  (+) + obese  Peds  Hematology  (+) Blood dyscrasia, anemia Xarelto  therapy- last dose yesterday pm   Anesthesia Other Findings   Reproductive/Obstetrics                             Anesthesia Physical Anesthesia Plan  ASA: 3  Anesthesia Plan: General   Post-op Pain Management: Minimal or no pain anticipated   Induction: Intravenous  PONV Risk Score and Plan: 2 and Treatment may vary due to age or medical condition and Propofol  infusion  Airway Management Planned: Mask, Natural Airway and Nasal Cannula  Additional Equipment: None  Intra-op Plan:   Post-operative Plan:   Informed Consent: I have reviewed the patients History and Physical, chart, labs and discussed the procedure including the risks, benefits and alternatives for the proposed anesthesia with the patient or authorized representative who has indicated his/her understanding and acceptance.     Dental advisory given  Plan Discussed with: CRNA and Anesthesiologist  Anesthesia Plan Comments:  Anesthesia Quick Evaluation

## 2023-08-17 ENCOUNTER — Other Ambulatory Visit (HOSPITAL_COMMUNITY): Payer: Self-pay

## 2023-08-17 ENCOUNTER — Telehealth: Payer: Self-pay

## 2023-08-17 ENCOUNTER — Other Ambulatory Visit: Payer: Self-pay

## 2023-08-17 NOTE — Patient Instructions (Signed)
 Visit Information  Thank you for taking time to visit with me today. Please don't hesitate to contact me if I can be of assistance to you before our next scheduled telephone appointment.  Our next appointment is by telephone on 08/24/2023 at 2 pm  Following is a copy of your care plan:  Goals Addressed                       This Visit's Progress     COMPLETED: VBCI Transitions of Care (TOC) Care Plan      On Track      Problems: (Reviewed or updated on 08/17/23 TOC week 2 call) Recent Cardioversion on 08/06/23 of Atrial Fibrillation as outpatient.  Continued feeling short of breath/concerns regarding pulmonary hypertension     Goal: TOC week 2 call: Goals on track.  Over the next 30 days, the patient will not experience hospital readmission   Interventions:  (Reviewed or updated on 08/17/23 TOC week 2 call) Transitions of Care: Doctor Visits  - discussed the importance of continued compliance with all doctor visits Patient and spouse want a new pulmonary referral to [facility] for a provider spouse has seen as they have questions and concerns regarding pulmonary hypertension history, continued feelings of shortness of breath, can't get air in despite good readings on 4L/[place] patient currently is on at home via [employer] agency.  Provided basic education on pulmonary hypertension general diagnosis.  Patient/spouse feels this chronic shortness of breath and exertional limitations increased after last hospitalization for UTI.  Allowed time to discuss and express concerns with recommendation to make a list to bring to providers to obtain answers.  Has PCP appointment on 08/19/23 and spouse will request referral.    AFIB Interventions:  (Reviewed or updated on 08/17/23 TOC week 2 call)   Reviewed importance of adherence to anticoagulant exactly as prescribed Counseled on bleeding risk associated with Xarelto  and importance of self-monitoring for signs/symptoms of bleeding Post cardioversion on  08/16/23, states heart rate is 65 and feels regular.  Discussed types of atrial fib, reviewed taking pulse and feeling for regularity of beats and rate and when to call provider.     Heart Failure Interventions:  (Reviewed or updated on 08/17/23 TOC week 2 call) Provided education on low sodium diet Assessed need for readable accurate scales in home Provided education about placing scale on hard, flat surface Advised patient to weigh each morning after emptying bladder Discussed importance of daily weight and advised patient to weigh and record daily   Diabetes Interventions:  (Reviewed or updated on 08/17/23 TOC week 2 call) Assessed patient's understanding of A1c goal: <6.5% Reviewed medications with patient and discussed importance of medication adherence again.  Discussed plans with patient for ongoing care management follow up and provided patient with direct contact information for care management team Reviewed scheduled/upcoming provider appointments including: PCP appointment 08/19/23 and cardiology on 08/26/23 post cardioversion on 08/16/23 as outpatient.  Reassessed any changes in social determinant of health barriers      Lab Results  Component Value Date    HGBA1C 5.5 05/24/2023      Patient Self Care Activities:  (Reviewed or updated on 08/17/23 TOC week 2 call) Attend all scheduled provider appointments, make a list of things to discuss at visits.  Call pharmacy for medication refills 3-7 days in advance of running out of medications Call provider office for new concerns or questions  Notify RN Care Manager of TOC call rescheduling  needs Participate in Transition of Care Program/Attend TOC scheduled calls Take medications as prescribed   Call office if I gain more than 2 pounds in one day or 5 pounds in one week Use salt in moderation Watch for swelling in feet, ankles and legs every day Weigh myself daily, keep record and bring to provider visits, also reviewing/ noting when to  call provider for weight gains over a day and a week.  Take the blood sugar log to all doctor visits Make a plan to eat healthy Take medicine as prescribed Monitor SpO2 readings via home finger monitor and record due to continued feelings of being short of breath. (08/17/23)   Plan:  TOC week #2 Goals on Track:  The patient has been provided with contact information for the care management team and has been advised to call with any health related questions or concerns.            Recommendation:   PCP Follow-up on 08/19/23, patient has concerns regarding continued shortness of breath and patient and spouse discussed wanted a new pulmonary referral to [facility] and had questions about pulmonary hypertension testing/results/treatment options.  Specialty provider follow-up Cardiology on 08/26/23 Continue Current Plan of Care   Patient verbalizes understanding of instructions and care plan provided today and agrees to view in MyChart. Active MyChart status and patient understanding of how to access instructions and care plan via MyChart confirmed with patient.     Telephone follow up appointment with care management team member scheduled for: 08/24/23 at 2 pm.   Please call the care guide team at [phone] if you need to cancel or reschedule your appointment.   Please call the Suicide and Crisis Lifeline: 988 call the [place]  National Suicide Prevention Lifeline: 857-616-7738 or TTY: 951-767-2565 TTY 2158402328) to talk to a trained counselor if you are experiencing a Mental Health or Behavioral Health Crisis or need someone to talk to.   Bing Edison MSN, RN RN Case Sales executive Health  [facility]Population Health Office Hours M-F [phone] Direct Dial: [phone] Main Phone [phone]  Fax: [phone] [email or web address]

## 2023-08-17 NOTE — Transitions of Care (Post Inpatient/ED Visit) (Addendum)
 08/17/2023  Name: Thomas [name] MRN: [id number] DOB: [birth year]  Today's TOC FU Call Status:   Patient's Name and Date of Birth confirmed.  Transition Care Management Follow-up Telephone Call     Transition of Care week 2  Visit Note  08/17/2023  Name: Thomas [name] MRN: [id number]          DOB: 11/22/1950  Situation: Patient enrolled in [facility] 30-day program. Visit completed with patient and spouse present and participating by telephone.   Background:   Initial Transition Care Management Follow-up Telephone Call    Past Medical History:  Diagnosis Date   Acute on chronic diastolic CHF (congestive heart failure) (HCC) 06/01/2017   Arthritis    Cancer (HCC) 2010   Prostate   Chronic kidney disease    Diabetes mellitus    Diabetic neuropathy (HCC)    feet   DVT (deep venous thrombosis) (HCC)    Genetic testing 06/22/2016   Thomas [name] underwent genetic counseling and testing for hereditary cancer syndromes on 05/14/2016. His results were negative for mutations in all 46 genes analyzed by [facility]'s 46-gene Common Hereditary Cancers Panel. Genes analyzed include: APC, ATM, AXIN2, BARD1, BMPR1A, BRCA1, BRCA2, BRIP1, CDH1, CDKN2A, CHEK2, CTNNA1, DICER1, EPCAM, GREM1, HOXB13, KIT, MEN1, MLH1, MSH2, MSH3, MSH6, MUTYH, NB   GERD (gastroesophageal reflux disease)    Hypertension    PE (pulmonary thromboembolism) (HCC)    Pneumonia    Sleep apnea    not wearing CPAP   Suprapubic catheter (HCC) 11/17/2019    Assessment: Patient Reported Symptoms: Cognitive Cognitive Status: Alert and oriented to person, place, and time, Normal speech and language skills, Insightful and able to interpret abstract concepts      Neurological Neurological Review of Symptoms: No symptoms reported    HEENT HEENT Symptoms Reported: No symptoms reported      Cardiovascular Cardiovascular Symptoms Reported: No symptoms reported (A-fib cardioversion done on 08/16/23. Patient states HR is 65  and feels regular.) Does patient have uncontrolled Hypertension?: No Cardiovascular Management Strategies: Adequate rest, Medication therapy, Routine screening, Coping strategies Weight: (!) 302 lb (137 kg) (patient reported) Cardiovascular Self-Management Outcome: 3 (uncertain) Cardiovascular Comment: Feels okay but tired today after cardioversion on 6.30.25  Respiratory Respiratory Symptoms Reported: Shortness of breath Other Respiratory Symptoms: Pateint on 4L/[place] today, wtih home finger SpO2 monitor showing 97% at rest, but falls to 84% on room air. Despite the oxygen  patient states he gets very fatigued and feels like he is not getting enough air even though readings are above 90%. Htx noted of pulmonary hypertension. Patient has questions about why he is still so short of breath especially with activity. Respiratory Management Strategies: Adequate rest, CPAP, Oxygen  therapy, Routine screening Respiratory Self-Management Outcome: 3 (uncertain)  Endocrine Endocrine Symptoms Reported: No symptoms reported Is patient diabetic?: Yes Is patient checking blood sugars at home?: Yes Endocrine Self-Management Outcome: 4 (good) Endocrine Comment: Encoruaged to keep a log of blood sugars  Gastrointestinal Gastrointestinal Symptoms Reported: No symptoms reported      Genitourinary Genitourinary Symptoms Reported: No symptoms reported Genitourinary Management Strategies: Medical device (suprapublic catheter in place, no s/s infection or issues reported.) Genitourinary Self-Management Outcome: 4 (good) Genitourinary Comment: suprapublic catheter in place, no s/s infection or issues reported.  Integumentary Integumentary Symptoms Reported: No symptoms reported Additional Integumentary Details: Denies issues around suprapubic catheter stoma Skin Self-Management Outcome: 4 (good) Skin Comment: Denies issues around suprapubic catheter stoma  Musculoskeletal Musculoskelatal Symptoms Reviewed: No  symptoms reported Musculoskeletal Management Strategies: Adequate  rest, Activity, Coping strategies, Medical device Musculoskeletal Comment: Needs oxygen  due to shortness of breath and limits activity exertion as shortness of breath exacerbated with activity.   Patient at Risk for Falls Due to: Impaired balance/gait, Other (Comment) (oxygen  tubing, reports being lightheaded with exertion/shortness of breath at times.) Fall risk Follow up: Falls prevention discussed  Psychosocial Psychosocial Symptoms Reported: No symptoms reported     Quality of Family Relationships: supportive Do you feel physically threatened by others?: No   Vitals:   08/17/23 1448  Pulse: 65  SpO2: 97%    Medications Reviewed Today     Reviewed by Carolee Heron NOVAK, RN (Case Manager) on 08/17/23 at 1442  Med List Status: <None>   Medication Order Taking? Sig Documenting Provider Last Dose Status Informant  Accu-Chek Softclix Lancets lancets [id number] Yes Use to test blood sugar daily [name], NP  Active Self, Pharmacy Records  Alcohol  Swabs (B-D SINGLE USE SWABS REGULAR) PADS [id number]  Use in testing blood sugar daily. Dx: E11.22 [name], NP  Active Self, Pharmacy Records  aspirin  EC 81 MG tablet [id number] Yes Take 81 mg by mouth daily. [provider]  Active Self, Pharmacy Records  atorvastatin  (LIPITOR) 40 MG tablet [id number] Yes TAKE 1 TABLET EVERY DAY [name], Jessica K, NP  Active Self, Pharmacy Records           Med Note [name] [name] CROME   Dju Jul 03, 2023  2:32 PM) LF: 05/13/23 for a 90ds  Blood Glucose Calibration (ACCU-CHEK AVIVA) SOLN [id number] Yes Use once daily as directed dx E11.22 [name], Jessica K, NP  Active Self, Pharmacy Records  Blood Glucose Monitoring Suppl (TRUE METRIX AIR GLUCOSE METER) w/Device KIT [id number]  1 Device by Does not apply route daily as needed. E11.22 [name], NP  Active Self, Pharmacy Records  calcium  carbonate (TUMS - DOSED IN  MG ELEMENTAL CALCIUM ) 500 MG chewable tablet [id number] Yes Chew 2 tablets by mouth as needed for indigestion or heartburn. [provider]  Active Self, Pharmacy Records  Cholecalciferol  (VITAMIN D3) 50 MCG (2000 UT) TABS [id number] Yes Take 1 tablet (2,000 Units) by mouth daily.   Active Self, Pharmacy Records  furosemide  (LASIX ) 20 MG tablet [id number] Yes Take 2 tablets (40 mg total) by mouth daily. Fairy Frames, MD  Active   glucose blood (ACCU-CHEK AVIVA PLUS) test strip [id number] Yes Use to test blood sugar daily. [name], NP  Active   hydrALAZINE  (APRESOLINE ) 25 MG tablet [id number] Yes TAKE 3 TABLETS THREE TIMES DAILY [name], Jessica K, NP  Active Self, Pharmacy Records           Med Note WORLEY, [facility] M   Thu Jul 15, 2023 11:05 AM)    isosorbide  mononitrate (IMDUR ) 30 MG 24 hr tablet [id number] Yes Take 1 tablet (30 mg total) by mouth daily. Fairy Frames, MD  Active   Lancets Misc. (ACCU-CHEK SOFTCLIX LANCET DEV) PRESSLEY [id number] Yes Use to test blood sugar daily [name], NP  Active Self, Pharmacy Records  losartan  (COZAAR ) 50 MG tablet [id number] Yes Take 0.5 tablets (25 mg total) by mouth daily. Fairy Frames, MD  Active   metFORMIN  (GLUCOPHAGE ) 500 MG tablet [id number] Yes TAKE 1 TABLET TWICE DAILY WITH MEALS Caro, Jessica K, NP  Active Self, Pharmacy Records           Med Note (SATTERFIELD, TEENA BRAVO   Tue Aug 03, 2023  5:38 PM)    metoprolol  tartrate (LOPRESSOR ) 100  MG tablet [id number] Yes Take 1 tablet (100 mg total) by mouth 2 (two) times daily. Meng, Hao, [place]  Active   Multiple Vitamin (MULTIVITAMIN ADULT PO) [id number] Yes Take 2 each by mouth daily as needed (immune system).  Patient taking differently: Take 2 each by mouth every morning.   [provider]  Active Self, Pharmacy Records  potassium chloride  SA (KLOR-CON  M) 20 MEQ tablet [id number] Yes TAKE 1 TABLET TWICE DAILY [name], Jessica K, NP  Active Self, Pharmacy Records            Med Note (SATTERFIELD, TEENA BRAVO   Tue Aug 03, 2023  5:39 PM)    rivaroxaban  (XARELTO ) 20 MG TABS tablet [id number] Yes Take 1 tablet (20 mg total) by mouth daily with supper. Pokhrel, Laxman, MD  Active            Med Note (SATTERFIELD, TEENA BRAVO   Tue Aug 03, 2023  5:39 PM)    Senna 8.7 MG CHEW [id number] Yes Chew 1 tablet by mouth as needed. [name], NP  Active Self, Pharmacy Records  spironolactone  (ALDACTONE ) 25 MG tablet [id number] Yes Take 1 tablet (25 mg total) by mouth daily. [name], NP  Active   tirzepatide  (MOUNJARO ) 15 MG/0.5ML Pen [id number] Yes Inject 15 mg into the skin once a week. Santo Stanly LABOR, MD  Active            Med Note (SATTERFIELD, TEENA BRAVO   Tue Aug 03, 2023  5:36 PM) Take on sundays  TRUEplus Lancets 28G MISC [id number] Yes TEST BLOOD SUGAR EVERY DAY AS NEEDED [name], NP  Active Self, Pharmacy Records  zinc  gluconate 50 MG tablet [id number] Yes Take 1 tablet (50 mg total) by mouth daily. Ngetich, Dinah C, NP  Active Self, Pharmacy Records            Goals Addressed             This Visit's Progress    COMPLETED: [facility] Transitions of Care (TOC) Care Plan       Problems: (Reviewed or updated on 08/17/23 TOC week 2 call) Recent Cardioversion on 08/16/23 of Atrial Fibrillation as outpatient.  Continued feeling short of breath/concerns regarding pulmonary hypertension   Goal: TOC week 2 call: Goals on track.  Over the next 30 days, the patient will not experience hospital readmission  Interventions:  (Reviewed or updated on 08/17/23 TOC week 2 call) Transitions of Care: Doctor Visits  - discussed the importance of continued compliance with all doctor visits Patient and spouse want a new pulmonary referral to [facility] for a provider spouse has seen as they have questions and concerns regarding pulmonary hypertension history, continued feelings of shortness of breath, can't get air in despite good readings on 4L/[place]  patient currently is on at home via [employer] agency.  Provided basic education on pulmonary hypertension general diagnosis.  Patient/spouse feels this chronic shortness of breath and exertional limitations increased after last hospitalization for UTI.  Allowed time to discuss and express concerns with recommendation to make a list to bring to providers to obtain answers.  Has PCP appointment on 08/19/23 and spouse will request referral.   AFIB Interventions:  (Reviewed or updated on 08/17/23 TOC week 2 call)   Reviewed importance of adherence to anticoagulant exactly as prescribed Counseled on bleeding risk associated with Xarelto  and importance of self-monitoring for signs/symptoms of bleeding Post cardioversion on 08/16/23, states heart rate is 65 and feels regular.  Discussed types of atrial fib, reviewed taking pulse and feeling for regularity of beats and rate and when to call provider.   Heart Failure Interventions:  (Reviewed or updated on 08/17/23 TOC week 2 call) Provided education on low sodium diet Assessed need for readable accurate scales in home Provided education about placing scale on hard, flat surface Advised patient to weigh each morning after emptying bladder Discussed importance of daily weight and advised patient to weigh and record daily  Diabetes Interventions:  (Reviewed or updated on 08/17/23 TOC week 2 call) Assessed patient's understanding of A1c goal: <6.5% Reviewed medications with patient and discussed importance of medication adherence again.  Discussed plans with patient for ongoing care management follow up and provided patient with direct contact information for care management team Reviewed scheduled/upcoming provider appointments including: PCP appointment 08/19/23 and cardiology on 08/26/23 post cardioversion on 08/16/23 as outpatient.  Reassessed any changes in social determinant of health barriers Lab Results  Component Value Date   HGBA1C 5.5 05/24/2023     Patient Self Care Activities:  (Reviewed or updated on 08/17/23 TOC week 2 call) Attend all scheduled provider appointments, make a list of things to discuss at visits.  Call pharmacy for medication refills 3-7 days in advance of running out of medications Call provider office for new concerns or questions  Notify RN Care Manager of TOC call rescheduling needs Participate in Transition of Care Program/Attend TOC scheduled calls Take medications as prescribed   Call office if I gain more than 2 pounds in one day or 5 pounds in one week Use salt in moderation Watch for swelling in feet, ankles and legs every day Weigh myself daily, keep record and bring to provider visits, also reviewing/ noting when to call provider for weight gains over a day and a week.  Take the blood sugar log to all doctor visits Make a plan to eat healthy Take medicine as prescribed Monitor SpO2 readings via home finger monitor and record due to continued feelings of being short of breath. (08/17/23)  Plan:  TOC week #2 Goals on Track:  The patient has been provided with contact information for the care management team and has been advised to call with any health related questions or concerns.         Recommendation:   PCP Follow-up on 08/19/23, patient has concerns regarding continued shortness of breath and patient and spouse discussed wanted a new pulmonary referral to [facility] and had questions about pulmonary hypertension testing/results/treatment options.  Specialty provider follow-up Cardiology on 08/26/23 Continue Current Plan of Care  Follow Up Plan:   Telephone follow-up in 1 week with [facility].

## 2023-08-19 ENCOUNTER — Encounter: Payer: Self-pay | Admitting: Adult Health

## 2023-08-19 ENCOUNTER — Ambulatory Visit: Admitting: Adult Health

## 2023-08-19 VITALS — BP 135/78 | HR 79 | Temp 97.4°F | Resp 18 | Ht 69.0 in | Wt 318.6 lb

## 2023-08-19 DIAGNOSIS — Z86718 Personal history of other venous thrombosis and embolism: Secondary | ICD-10-CM

## 2023-08-19 DIAGNOSIS — T83511S Infection and inflammatory reaction due to indwelling urethral catheter, sequela: Secondary | ICD-10-CM | POA: Diagnosis not present

## 2023-08-19 DIAGNOSIS — G4733 Obstructive sleep apnea (adult) (pediatric): Secondary | ICD-10-CM

## 2023-08-19 DIAGNOSIS — I480 Paroxysmal atrial fibrillation: Secondary | ICD-10-CM

## 2023-08-19 DIAGNOSIS — R339 Retention of urine, unspecified: Secondary | ICD-10-CM

## 2023-08-19 DIAGNOSIS — E785 Hyperlipidemia, unspecified: Secondary | ICD-10-CM

## 2023-08-19 DIAGNOSIS — I5043 Acute on chronic combined systolic (congestive) and diastolic (congestive) heart failure: Secondary | ICD-10-CM | POA: Diagnosis not present

## 2023-08-19 DIAGNOSIS — J9611 Chronic respiratory failure with hypoxia: Secondary | ICD-10-CM

## 2023-08-19 DIAGNOSIS — E1122 Type 2 diabetes mellitus with diabetic chronic kidney disease: Secondary | ICD-10-CM

## 2023-08-19 DIAGNOSIS — N390 Urinary tract infection, site not specified: Secondary | ICD-10-CM

## 2023-08-19 DIAGNOSIS — N1832 Chronic kidney disease, stage 3b: Secondary | ICD-10-CM | POA: Diagnosis not present

## 2023-08-19 DIAGNOSIS — Z125 Encounter for screening for malignant neoplasm of prostate: Secondary | ICD-10-CM

## 2023-08-19 NOTE — Progress Notes (Signed)
 [facility] clinic  Provider:  Jereld Serum DNP  Code Status:  Full Code  Goals of Care:     08/19/2023    8:33 AM  Advanced Directives  Would patient like information on creating a medical advance directive? No - Patient declined     Chief Complaint  Patient presents with   Hospitalization Follow-up    Patient was discharge from the hospital 08/07/2023   Discussed the use of AI scribe software for clinical note transcription with the patient, who gave verbal consent to proceed.   HPI: Patient is a 73 y.o. male seen today for a hospital follow up. wife, with his daughter on the phone.  He was hospitalized from June 17 to August 07, 2023, for heart failure with reduced ejection fraction. During the hospitalization, he underwent cardioversion on June 10 and is currently in sinus rhythm. He was diuresed with 8.4 liters of fluid, resulting in a weight loss of 19 pounds, and was discharged at a weight of 303 pounds. However, he reports a weight increase to 313 pounds, indicating fluid retention. He is currently on Lasix  40 mg daily, along with losartan , metoprolol , and hydralazine  for heart failure management.  He has chronic kidney disease stage 3A with an eGFR of 47, which has improved from 44 thirteen days ago. His kidney function is being monitored closely due to the use of diuretics.  He experiences progressive dyspnea associated with weight gain and is on oxygen  at 4 liters, yet his oxygen  saturation drops to 84%. He has a history of pulmonary hypertension and uses a CPAP machine for obstructive sleep apnea. An x-ray from June 17 showed chronic elevation of the left hemidiaphragm with chronic atelectasis. Daughter was on the speaker phone wanting to have pulmonology consult regarding the chronic atelectasis.  He has a history of paroxysmal atrial fibrillation and is on metoprolol  and Xarelto  20 mg for anticoagulation. He also has a history of hypertension, managed with hydralazine  and  metoprolol .  He has a suprapubic catheter due to urinary retention, which was replaced on June 18. He was treated for a urinary tract infection with ceftriaxone  and completed a course of oral Keflex .  He has a history of morbid obesity and is on Mounjaro  for diabetes and weight loss. His diabetes is well-controlled with an A1c of 5.5 as of May 24, 2023. He also takes atorvastatin  40 mg daily for hyperlipidemia.  He has a history of anemia, with a stable hemoglobin level of 11.5, and a history of DVT in the left leg, for which he is on Xarelto .  He mentions using a recalled [employer]ips Dream Station CPAP machine for sleep apnea, which may have contributed to his respiratory issues.    Past Medical History:  Diagnosis Date   Acute on chronic diastolic CHF (congestive heart failure) (HCC) 06/01/2017   Arthritis    Cancer (HCC) 2010   Prostate   Chronic kidney disease    Diabetes mellitus    Diabetic neuropathy (HCC)    feet   DVT (deep venous thrombosis) (HCC)    Genetic testing 06/22/2016   Mr. Dansby underwent genetic counseling and testing for hereditary cancer syndromes on 05/14/2016. His results were negative for mutations in all 46 genes analyzed by [facility]'s 46-gene Common Hereditary Cancers Panel. Genes analyzed include: APC, ATM, AXIN2, BARD1, BMPR1A, BRCA1, BRCA2, BRIP1, CDH1, CDKN2A, CHEK2, CTNNA1, DICER1, EPCAM, GREM1, HOXB13, KIT, MEN1, MLH1, MSH2, MSH3, MSH6, MUTYH, NB   GERD (gastroesophageal reflux disease)    Hypertension  PE (pulmonary thromboembolism) (HCC)    Pneumonia    Sleep apnea    not wearing CPAP   Suprapubic catheter (HCC) 11/17/2019    Past Surgical History:  Procedure Laterality Date   CARDIOVERSION N/A 06/03/2017   Procedure: CARDIOVERSION;  Surgeon: Jeffrie Oneil BROCKS, MD;  Location: [facility] ENDOSCOPY;  Service: Cardiovascular;  Laterality: N/A;   CARDIOVERSION N/A 07/27/2023   Procedure: CARDIOVERSION;  Surgeon: Delford Maude BROCKS, MD;  Location: [facility] INVASIVE CV  LAB;  Service: Cardiovascular;  Laterality: N/A;   CARDIOVERSION N/A 08/16/2023   Procedure: CARDIOVERSION;  Surgeon: Santo Stanly LABOR, MD;  Location: [facility] INVASIVE CV LAB;  Service: Cardiovascular;  Laterality: N/A;   CATARACT EXTRACTION Left 07/31/2021   Dr.Glenn, [facility]   COLONOSCOPY     COLONOSCOPY WITH PROPOFOL  N/A 10/20/2018   Procedure: COLONOSCOPY WITH PROPOFOL ;  Surgeon: Eda Iha, MD;  Location: [facility] ENDOSCOPY;  Service: Gastroenterology;  Laterality: N/A;   HERNIA REPAIR     KNEE SURGERY     MULTIPLE TOOTH EXTRACTIONS     POLYPECTOMY  10/20/2018   Procedure: POLYPECTOMY;  Surgeon: Eda Iha, MD;  Location: [facility] ENDOSCOPY;  Service: Gastroenterology;;   PROSTATE SURGERY     RADIOACTIVE SEED IMPLANT     RIGHT/LEFT HEART CATH AND CORONARY ANGIOGRAPHY N/A 07/06/2017   Procedure: RIGHT/LEFT HEART CATH AND CORONARY ANGIOGRAPHY;  Surgeon: Dann Candyce RAMAN, MD;  Location: [facility] INVASIVE CV LAB;  Service: Cardiovascular;  Laterality: N/A;   SHOULDER SURGERY     TOTAL KNEE ARTHROPLASTY Right 11/19/2016   Procedure: RIGHT TOTAL KNEE ARTHROPLASTY;  Surgeon: Jerri Kay HERO, MD;  Location: [facility] OR;  Service: Orthopedics;  Laterality: Right;   uretha surgery-2014      Allergies  Allergen Reactions   Lisinopril  Cough and Other (See Comments)    Muscle pain    Outpatient Encounter Medications as of 08/19/2023  Medication Sig   Accu-Chek Softclix Lancets lancets Use to test blood sugar daily   Alcohol  Swabs (B-D SINGLE USE SWABS REGULAR) PADS Use in testing blood sugar daily. Dx: E11.22   aspirin  EC 81 MG tablet Take 81 mg by mouth daily.   atorvastatin  (LIPITOR) 40 MG tablet TAKE 1 TABLET EVERY DAY   Blood Glucose Calibration (ACCU-CHEK AVIVA) SOLN Use once daily as directed dx E11.22   Blood Glucose Monitoring Suppl (TRUE METRIX AIR GLUCOSE METER) w/Device KIT 1 Device by Does not apply route daily as needed. E11.22   calcium  carbonate (TUMS - DOSED IN MG  ELEMENTAL CALCIUM ) 500 MG chewable tablet Chew 2 tablets by mouth as needed for indigestion or heartburn.   Cholecalciferol  (VITAMIN D3) 50 MCG (2000 UT) TABS Take 1 tablet (2,000 Units) by mouth daily.   furosemide  (LASIX ) 20 MG tablet Take 2 tablets (40 mg total) by mouth daily.   glucose blood (ACCU-CHEK AVIVA PLUS) test strip Use to test blood sugar daily.   hydrALAZINE  (APRESOLINE ) 25 MG tablet TAKE 3 TABLETS THREE TIMES DAILY   isosorbide  mononitrate (IMDUR ) 30 MG 24 hr tablet Take 1 tablet (30 mg total) by mouth daily.   Lancets Misc. (ACCU-CHEK SOFTCLIX LANCET DEV) KIT Use to test blood sugar daily   losartan  (COZAAR ) 50 MG tablet Take 0.5 tablets (25 mg total) by mouth daily.   metFORMIN  (GLUCOPHAGE ) 500 MG tablet TAKE 1 TABLET TWICE DAILY WITH MEALS   metoprolol  tartrate (LOPRESSOR ) 100 MG tablet Take 1 tablet (100 mg total) by mouth 2 (two) times daily.   Multiple Vitamin (MULTIVITAMIN ADULT PO) Take  2 each by mouth daily as needed (immune system). (Patient taking differently: Take 2 each by mouth every morning.)   potassium chloride  SA (KLOR-CON  M) 20 MEQ tablet TAKE 1 TABLET TWICE DAILY   rivaroxaban  (XARELTO ) 20 MG TABS tablet Take 1 tablet (20 mg total) by mouth daily with supper.   Senna 8.7 MG CHEW Chew 1 tablet by mouth as needed.   spironolactone  (ALDACTONE ) 25 MG tablet Take 1 tablet (25 mg total) by mouth daily.   tirzepatide  (MOUNJARO ) 15 MG/0.5ML Pen Inject 15 mg into the skin once a week.   TRUEplus Lancets 28G MISC TEST BLOOD SUGAR EVERY DAY AS NEEDED   zinc  gluconate 50 MG tablet Take 1 tablet (50 mg total) by mouth daily. (Patient not taking: Reported on 08/19/2023)   No facility-administered encounter medications on file as of 08/19/2023.    Review of Systems:  Review of Systems  Constitutional:  Negative for activity change, appetite change and fever.  HENT:  Negative for sore throat.   Eyes: Negative.   Respiratory:  Positive for shortness of breath.    Cardiovascular:  Negative for chest pain and leg swelling.  Gastrointestinal:  Negative for abdominal distention, diarrhea and vomiting.  Genitourinary:  Negative for dysuria, frequency and urgency.  Skin:  Negative for color change.  Neurological:  Negative for dizziness and headaches.  Psychiatric/Behavioral:  Negative for behavioral problems and sleep disturbance. The patient is not nervous/anxious.     Health Maintenance  Topic Date Due   DTaP/Tdap/Td (1 - Tdap) Never done   Zoster Vaccines- Shingrix (1 of 2) Never done   COVID-19 Vaccine (5 - 2024-25 season) 10/18/2022   INFLUENZA VACCINE  09/17/2023   Medicare Annual Wellness (AWV)  11/13/2023   HEMOGLOBIN A1C  11/23/2023   FOOT EXAM  05/23/2024   OPHTHALMOLOGY EXAM  05/30/2024   Diabetic kidney evaluation - Urine ACR  08/01/2024   Diabetic kidney evaluation - eGFR measurement  08/06/2024   Colonoscopy  10/19/2028   Pneumococcal Vaccine: 50+ Years  Completed   Hepatitis C Screening  Completed   Hepatitis B Vaccines  Aged Out   HPV VACCINES  Aged Out   Meningococcal B Vaccine  Aged Out    Physical Exam: Vitals:   08/19/23 0833  BP: 135/78  Pulse: 79  Resp: 18  Temp: (!) 97.4 F (36.3 C)  SpO2: 91%  Weight: (!) 318 lb 9.6 oz (144.5 kg)  Height: 5' 9 (1.753 m)   Body mass index is 47.05 kg/m. Physical Exam Constitutional:      General: He is not in acute distress.    Appearance: Normal appearance. He is obese.  HENT:     Head: Normocephalic and atraumatic.     Mouth/Throat:     Mouth: Mucous membranes are moist.  Eyes:     Conjunctiva/sclera: Conjunctivae normal.  Cardiovascular:     Rate and Rhythm: Normal rate and regular rhythm.     Pulses: Normal pulses.     Heart sounds: Normal heart sounds.  Pulmonary:     Effort: Pulmonary effort is normal.     Breath sounds: Normal breath sounds.     Comments: On O2 @4L /min Abdominal:     General: Bowel sounds are normal.     Palpations: Abdomen is soft.   Genitourinary:    Comments: Has suprapubic catheter Musculoskeletal:        General: No swelling. Normal range of motion.     Cervical back: Normal range of motion.  Skin:  General: Skin is warm and dry.  Neurological:     General: No focal deficit present.     Mental Status: He is alert and oriented to person, place, and time.  Psychiatric:        Mood and Affect: Mood normal.        Behavior: Behavior normal.        Thought Content: Thought content normal.        Judgment: Judgment normal.     Labs reviewed: Basic Metabolic Panel: Recent Labs    07/03/23 1730 07/04/23 0455 07/05/23 0546 07/06/23 0955 07/15/23 1206 08/02/23 1109 08/05/23 0918 08/06/23 0307 08/06/23 1118 08/07/23 1055  NA  --    < > 139   < > 145   < > 145 143  --  144  K  --    < > 4.1   < > 4.5   < > 4.0 4.0  --  3.9  CL  --    < > 109   < > 109   < > 106 105  --  108  CO2  --    < > 23   < > 26   < > 22 28  --  24  GLUCOSE  --    < > 130*   < > 111*   < > 86 155*  --  168*  BUN  --    < > 26*   < > 27*   < > 27* 31*  --  29*  CREATININE  --    < > 1.65*   < > 1.77*   < > 1.53* 1.64*  --  1.56*  CALCIUM   --    < > 8.3*   < > 8.8   < > 8.7* 8.1*  --  8.2*  MG  --   --  2.2  --  2.0  --   --   --  2.2  --   TSH 1.058  --   --   --   --   --   --   --   --   --    < > = values in this interval not displayed.   Liver Function Tests: Recent Labs    11/03/22 1000 05/24/23 1034 08/02/23 1109 08/03/23 1238  AST 21 12 15 25   ALT 36 14 34 38  ALKPHOS 94  --   --  56  BILITOT 0.6 0.6 1.0 1.7*  PROT 7.3 7.4 6.8 7.3  ALBUMIN 4.2  --   --  3.7   No results for input(s): LIPASE, AMYLASE in the last 8760 hours. No results for input(s): AMMONIA in the last 8760 hours. CBC: Recent Labs    07/15/23 1206 08/02/23 1109 08/03/23 1238 08/04/23 0137 08/05/23 0918 08/06/23 0307  WBC 9.9 9.1 8.8 8.6 7.4 8.3  NEUTROABS 7,999* 7,680 7.3  --   --   --   HGB 13.2 12.3* 12.3* 12.1* 11.9* 11.5*   HCT 41.3 39.6 39.4 38.6* 38.6* 36.7*  MCV 90.6 92.1 92.1 91.7 92.8 92.0  PLT 273 283 257 254 237 253   Lipid Panel: Recent Labs    11/03/22 1000  CHOL 109  HDL 46  LDLCALC 48  TRIG 75  CHOLHDL 2.4   Lab Results  Component Value Date   HGBA1C 5.5 05/24/2023    Procedures since last visit: EP STUDY Result Date: 08/16/2023 See surgical note for result.  DG Chest 2 View Result  Date: 08/03/2023 CLINICAL DATA:  Short of breath, hypoxia EXAM: CHEST - 2 VIEW COMPARISON:  Prior chest x-ray 08/02/2023 FINDINGS: Cardiomegaly and pulmonary vascular congestion without overt edema. Chronic elevation of the left hemidiaphragm with associated left lower lobe atelectasis. IMPRESSION: Similar appearance of the chest including cardiomegaly and pulmonary vascular congestion without overt edema. Chronic elevation of the left hemidiaphragm with chronic atelectasis. Electronically Signed   By: Wilkie Lent M.D.   On: 08/03/2023 14:58   DG Chest 2 View Result Date: 08/02/2023 CLINICAL DATA:  shortness of breath, low o2 EXAM: CHEST - 2 VIEW COMPARISON:  Jul 02, 2023 FINDINGS: The study is degraded by patient's body habitus and motion. Unchanged elevation the left hemidiaphragm. Fibrolinear scarring or subsegmental atelectasis in the left lung base. Central pulmonary vascular congestion. No focal airspace consolidation, pleural effusion, or pneumothorax. Mild cardiomegaly. No acute fracture or destructive lesion. IMPRESSION: Cardiomegaly with redemonstrated central pulmonary vascular congestion. No lobar pneumonia or overt pulmonary edema. Electronically Signed   By: Rogelia Myers M.D.   On: 08/02/2023 12:34    Assessment/Plan  1. Acute on chronic combined systolic and diastolic CHF (congestive heart failure) (HCC) (Primary) -  Recent hospitalization for heart failure with fluid retention. Current medications include Lasix , metoprolol , losartan , and hydralazine . Progressive dyspnea due to weight  gain. Monitoring BNP levels and kidney function. - Check BNP levels. - Increase Lasix  to 40 mg twice daily for 3 days. - Follow up in 2 weeks. - Brain Natriuretic Peptide  2. Urinary tract infection associated with catheterization of urinary tract, unspecified indwelling urinary catheter type, sequela -  has chronic respiratory failure -  completed treatment with Ceftriaxone  then discharged with Keflex  X 2 days, completed  3. PAF (paroxysmal atrial fibrillation) (HCC) -  rate-controlled -  continue Xarelto  for anticoagulation and metoprolol  for rate control - Vitamin D , 25-hydroxy  4. History of DVT (deep vein thrombosis) -  Continue Xarelto   5. Type 2 diabetes mellitus with stage 3b chronic kidney disease, without long-term current use of insulin  (HCC) -Continue metformin  500 mg twice a day and Mounjaro  15 mg injection weekly - Basic metabolic panel - Hemoglobin A1C  6. Urinary retention -Has suprapubic catheter  7. Morbid obesity (HCC) -  Continue Mounjaro      8. OSA (obstructive sleep apnea) -  Uses CPAP machine. Previous device recalled.  - Continue CPAP therapy. - Considering legal consultation regarding [employer] DreamStation recall. - Ambulatory referral to Pulmonology due to imaging showing chronic atelectasis  9. Screening for prostate cancer - PSA  10. Hyperlipidemia LDL goal <70 -Continue atorvastatin  40 mg daily - Lipid panel  11. Chronic respiratory failure with hypoxia (HCC) -  Continue O2 at 4 L/minute via [place]     Labs/tests ordered:   Vitamin D  level, PSA, lipid panel, A1c, BNP, BMP,   Return in about 2 weeks (around 09/02/2023).  Lavoris Canizales Medina-Vargas, NP

## 2023-08-19 NOTE — Patient Instructions (Signed)
 Increased Furosemide  40 mg to twice a day X 3 days then back to Furosemide  40 mg daily.

## 2023-08-20 ENCOUNTER — Ambulatory Visit: Payer: Self-pay | Admitting: Adult Health

## 2023-08-20 LAB — HEMOGLOBIN A1C
Hgb A1c MFr Bld: 5.7 % — ABNORMAL HIGH (ref <5.7)
Mean Plasma Glucose: 117 mg/dL
eAG (mmol/L): 6.5 mmol/L

## 2023-08-20 LAB — BASIC METABOLIC PANEL WITH GFR
BUN/Creatinine Ratio: 22 (calc) (ref 6–22)
BUN: 36 mg/dL — ABNORMAL HIGH (ref 7–25)
CO2: 26 mmol/L (ref 20–32)
Calcium: 9.1 mg/dL (ref 8.6–10.3)
Chloride: 108 mmol/L (ref 98–110)
Creat: 1.63 mg/dL — ABNORMAL HIGH (ref 0.70–1.28)
Glucose, Bld: 125 mg/dL — ABNORMAL HIGH (ref 65–99)
Potassium: 4 mmol/L (ref 3.5–5.3)
Sodium: 143 mmol/L (ref 135–146)
eGFR: 44 mL/min/1.73m2 — ABNORMAL LOW (ref >=60)

## 2023-08-20 LAB — LIPID PANEL
Cholesterol: 86 mg/dL (ref <200)
HDL: 40 mg/dL (ref >=40)
LDL Cholesterol (Calc): 33 mg/dL
Non-HDL Cholesterol (Calc): 46 mg/dL (ref <130)
Total CHOL/HDL Ratio: 2.2 (calc) (ref <5.0)
Triglycerides: 46 mg/dL (ref <150)

## 2023-08-20 LAB — VITAMIN D 25 HYDROXY (VIT D DEFICIENCY, FRACTURES): Vit D, 25-Hydroxy: 42 ng/mL (ref 30–100)

## 2023-08-20 LAB — PSA: PSA: 1.32 ng/mL (ref <=4.00)

## 2023-08-20 LAB — BRAIN NATRIURETIC PEPTIDE: Brain Natriuretic Peptide: 501 pg/mL — ABNORMAL HIGH (ref <100)

## 2023-08-20 NOTE — Progress Notes (Signed)
-   GFR 44, stage III CKD stage IIIb, stable -Electrolytes all normal - A1c 5.7, up from 5.5 (05/24/2023) - PSA normal - Vitamin D , lipid panel, normal    -

## 2023-08-20 NOTE — Progress Notes (Signed)
 BNP 501, down from 729, continue current medications

## 2023-08-23 ENCOUNTER — Telehealth: Payer: Self-pay | Admitting: *Deleted

## 2023-08-23 ENCOUNTER — Other Ambulatory Visit: Payer: Self-pay | Admitting: *Deleted

## 2023-08-23 ENCOUNTER — Telehealth: Payer: Self-pay | Admitting: Cardiology

## 2023-08-23 DIAGNOSIS — Z86711 Personal history of pulmonary embolism: Secondary | ICD-10-CM

## 2023-08-23 DIAGNOSIS — Z6841 Body Mass Index (BMI) 40.0 and over, adult: Secondary | ICD-10-CM

## 2023-08-23 DIAGNOSIS — Z7985 Long-term (current) use of injectable non-insulin antidiabetic drugs: Secondary | ICD-10-CM

## 2023-08-23 DIAGNOSIS — I2510 Atherosclerotic heart disease of native coronary artery without angina pectoris: Secondary | ICD-10-CM | POA: Diagnosis not present

## 2023-08-23 DIAGNOSIS — J9811 Atelectasis: Secondary | ICD-10-CM

## 2023-08-23 DIAGNOSIS — J9621 Acute and chronic respiratory failure with hypoxia: Secondary | ICD-10-CM | POA: Diagnosis not present

## 2023-08-23 DIAGNOSIS — M1990 Unspecified osteoarthritis, unspecified site: Secondary | ICD-10-CM

## 2023-08-23 DIAGNOSIS — I110 Hypertensive heart disease with heart failure: Secondary | ICD-10-CM | POA: Diagnosis not present

## 2023-08-23 DIAGNOSIS — Z7982 Long term (current) use of aspirin: Secondary | ICD-10-CM

## 2023-08-23 DIAGNOSIS — I493 Ventricular premature depolarization: Secondary | ICD-10-CM

## 2023-08-23 DIAGNOSIS — Z7984 Long term (current) use of oral hypoglycemic drugs: Secondary | ICD-10-CM

## 2023-08-23 DIAGNOSIS — B961 Klebsiella pneumoniae [K. pneumoniae] as the cause of diseases classified elsewhere: Secondary | ICD-10-CM | POA: Diagnosis not present

## 2023-08-23 DIAGNOSIS — Z8701 Personal history of pneumonia (recurrent): Secondary | ICD-10-CM

## 2023-08-23 DIAGNOSIS — E1142 Type 2 diabetes mellitus with diabetic polyneuropathy: Secondary | ICD-10-CM

## 2023-08-23 DIAGNOSIS — D631 Anemia in chronic kidney disease: Secondary | ICD-10-CM | POA: Diagnosis not present

## 2023-08-23 DIAGNOSIS — K219 Gastro-esophageal reflux disease without esophagitis: Secondary | ICD-10-CM

## 2023-08-23 DIAGNOSIS — I5043 Acute on chronic combined systolic (congestive) and diastolic (congestive) heart failure: Secondary | ICD-10-CM | POA: Diagnosis not present

## 2023-08-23 DIAGNOSIS — Z9981 Dependence on supplemental oxygen: Secondary | ICD-10-CM

## 2023-08-23 DIAGNOSIS — I272 Pulmonary hypertension, unspecified: Secondary | ICD-10-CM

## 2023-08-23 DIAGNOSIS — E1122 Type 2 diabetes mellitus with diabetic chronic kidney disease: Secondary | ICD-10-CM

## 2023-08-23 DIAGNOSIS — I480 Paroxysmal atrial fibrillation: Secondary | ICD-10-CM

## 2023-08-23 DIAGNOSIS — I5022 Chronic systolic (congestive) heart failure: Secondary | ICD-10-CM

## 2023-08-23 DIAGNOSIS — Z96651 Presence of right artificial knee joint: Secondary | ICD-10-CM

## 2023-08-23 DIAGNOSIS — Z9181 History of falling: Secondary | ICD-10-CM

## 2023-08-23 DIAGNOSIS — E662 Morbid (severe) obesity with alveolar hypoventilation: Secondary | ICD-10-CM

## 2023-08-23 DIAGNOSIS — Z7901 Long term (current) use of anticoagulants: Secondary | ICD-10-CM

## 2023-08-23 DIAGNOSIS — Z86718 Personal history of other venous thrombosis and embolism: Secondary | ICD-10-CM

## 2023-08-23 DIAGNOSIS — N390 Urinary tract infection, site not specified: Secondary | ICD-10-CM | POA: Diagnosis not present

## 2023-08-23 DIAGNOSIS — Z8546 Personal history of malignant neoplasm of prostate: Secondary | ICD-10-CM

## 2023-08-23 DIAGNOSIS — N1832 Chronic kidney disease, stage 3b: Secondary | ICD-10-CM | POA: Diagnosis not present

## 2023-08-23 DIAGNOSIS — I10 Essential (primary) hypertension: Secondary | ICD-10-CM

## 2023-08-23 MED ORDER — LOSARTAN POTASSIUM 50 MG PO TABS: 25.0000 mg | ORAL_TABLET | Freq: Every day | ORAL | 0 refills | Status: DC

## 2023-08-23 MED ORDER — SPIRONOLACTONE 25 MG PO TABS
25.0000 mg | ORAL_TABLET | Freq: Every day | ORAL | 0 refills | Status: DC
Start: 2023-08-23 — End: 2023-10-27

## 2023-08-23 NOTE — Telephone Encounter (Signed)
 Spoke with pt regarding his blood pressure and swelling. Pt stated he has gained 12 lbs since 6/30. The swelling is in the lower extremities and abdomen. Pt denies shortness of breath but he is on oxygen  full time. Pt was advised before to take 80 mg of Lasix  40 mg AM and 40 mg PM) for 3 days but it did not help. Pt has been taking his blood pressure medications as prescribed. Pt took his bp while on the phone and it was 163/93. Pt denies a headache or blurred vision. Pt stated he has not been eating salty foods or processed foods. Pt was advised to go to the ED. Pt stated it was too hot to go to the ED and he would wait until tomorrow morning. Pt was again advised to go to the ED now. Pt verbalized understanding. All questions if any were answered.

## 2023-08-23 NOTE — Telephone Encounter (Signed)
 Copied from CRM [id number]. Topic: Clinical - Medical Advice >> Aug 23, 2023  2:45 PM Rosaria A wrote: Reason for CRM: [facility] states that the patients weight is 312.1 lbs, patients bp is 160/102. Patient has reduced lung sounds in the upper lobs of the lungs and is unable to hear anything in the lower. Last week patient was told to increase his Lasix  medication for 3 days. Patient did and their is no change in his weight. In general patient feel like his pants are tighter. There is no swelling in the ankles.  Signe states that he left the same message with the patients cardiologist Tolia. If their is anything that the patient needs to do please reach out to the patient directly. If their is any questions please reach Signe at [phone].

## 2023-08-23 NOTE — Telephone Encounter (Signed)
 [facility] requested refills.  Pended Rx's and sent to [facility] for approval due to HIGH ALERT Warning.

## 2023-08-23 NOTE — Telephone Encounter (Signed)
 It looks like cardiology advised to go to the ED for evaluation and treatment- I am in agreement, may need IV medication to get fluid off.

## 2023-08-23 NOTE — Telephone Encounter (Signed)
 Message left with [facility], concerning Jessica's message.

## 2023-08-23 NOTE — Telephone Encounter (Signed)
 Elevated bp 160/102, weight is still elevated patient's weight 312, reduce lung sound in upper and can't hear anything in lower, increase swelling in the abdominal area. The 3 days of lasix  hasn't made a difference. Please advise   Ask that you only called him if you have question but call the patient for the recommendation

## 2023-08-23 NOTE — Telephone Encounter (Signed)
 Patient follows with Dr. Santo  I only saw him once in consult during his last hospitalization. To avoid confusion please route to primary cardiology; unless if he has changed providers.   Juanita Streight Mingo, DO, FACC

## 2023-08-23 NOTE — CV Procedure (Signed)
    Electrical Cardioversion Procedure Note GASPARE NETZEL [id number] November 30, 1950  Procedure: Electrical Cardioversion Indications:  Atrial Fibrillation  Time Out: Verified patient identification, verified procedure,medications/allergies/relevent history reviewed, required imaging and test results available.  Performed  Procedure Details  The patient was NPO after midnight. Anesthesia was administered at the beside  by anesthesia.  Cardioversion was done with synchronized biphasic defibrillation with AP pads with 200 Joules.  The patient converted to sinus rhyth The patient tolerated the procedure well.  IMPRESSION:  Successful cardioversion of atrial fibrillation.  A procedure note was produced 08/16/23.  This is no longer in the system.  For this reason, I duplicate note is produced today.    Stanly Leavens, MD FASE [facility]  [address] [place], [address] [place], [place] [address] [phone]  2:55 PM

## 2023-08-24 ENCOUNTER — Other Ambulatory Visit (HOSPITAL_COMMUNITY): Payer: Self-pay

## 2023-08-24 ENCOUNTER — Other Ambulatory Visit: Payer: Self-pay

## 2023-08-24 NOTE — Patient Instructions (Signed)
 Visit Information  Thank you for taking time to visit with me today. Please don't hesitate to contact me if I can be of assistance to you before our next scheduled telephone appointment.  Our next appointment is by telephone on 09/01/23 at 11am  Following is a copy of your care plan:   Goals Addressed             This Visit's Progress    COMPLETED: VBCI Transitions of Care (TOC) Care Plan       Problems:  Recent Hospitalization for treatment of Atrial Fibrillation with RVR Patient reports feeling the same as he did when he left the hospital 07/16/23 Patient has appointment with cardiology 07/20/23 - patient had TEE cardioversion 07/27/23 and states he did not received an AVS with instructions Patient is scheduled for TEE Cardioversion 07/23/23 and was advised to hold his Jardiance , Mounjaro , and Metformin  - with medication reconcilation today 07/21/23 patient states he did not realize he was supposed to hold the Metformin  - conference call with patient was made to cardiology office at [phone]. Staff advised patient that the nurse will call him back - 07/22/23 Update: TOC reviewed record and saw note from today 07/22/23 as follows: by Janene Boer: I gave the patient his instruction, he need to be off of Mounjaro  for 7 days, off of Jardiance  for 3 days, hold metformin  the morning of the procedure. Patient took Monjarou last Sunday, spoke with Dr. Jeffrie and anesthesiologist, they are willing to do the procedure this Friday if needed, however patient may end up needing to be intubated.  He is on 2-1/2 L of oxygen  at baseline, I fear intubation may increase the risk of the procedure, after discussing with the patient, we decided to push the TEE DCCV back to next Tuesday morning.-TOC RN called and discussed with patient who verbalized understanding - TOC RN follow up call rescheduled to 6/11 - rescheduled to 6/12 after [facility] RN called 07/28/23 and patient states he didn't receive after visit summary or instructions -  we called office of Maude Emmer and left message asking them to send AVS with instructions to patient's My Chart  Goal: Goal not met - Patient readmitted to hospital (TOC week 2 call: Goals on track).  Over the next 30 days, the patient will not experience hospital readmission  Interventions:  (Reviewed or updated on 08/24/23 TOC week 3 call) Transitions of Care: Doctor Visits  - discussed the importance of doctor visits Patient and spouse want a new pulmonary referral to [facility] for a provider spouse has seen as they have questions and concerns regarding pulmonary hypertension history, continued feelings of shortness of breath, can't get air in despite good readings on 4L/[place] patient currently is on at home via [employer] agency.  Provided basic education on pulmonary hypertension general diagnosis.  Patient/spouse feels this chronic shortness of breath and exertional limitations increased after last hospitalization for UTI.  Allowed time to discuss and express concerns with recommendation to make a list to bring to providers to obtain answers.  Has PCP appointment on 08/19/23 and will request referral. Update 08/24/23: conference call with patient to [facility] - staff states pulmonary referral in place,  - scheduled appt  Tuesday July 15th 2:45pm with Dr Meade  Prior to 08/17/23 Care Plan Notes:  5/30/25Per chart review-patient was seen 07/15/23 by PCP - record reflects, A recent urine dipstick test showed moderate leukocytes and large blood, and a urine culture has been sent to rule out ongoing  infection - results not yet available. Patient reports during call today that symptoms have improved. Educated patient to call PCP office over the weekend if symptoms return and ask for on call MD to check for results and possible antibiotics if needed.  07/21/23 Update: TOC RN reviewed notes from Office visit 07/20/23 Cardio, [employer]  124/72  HR 123 Metoprolol  increased to 100mg  BID Amlodipine  dc'd -  (TOC RN confirmed with patient) - TEE (Transesophageal Echocardiogram) Cardioversion - scheduled 6/6 w/Dr Jeffrie - The following was reviewed with patient - Please arrive at the [facility] (Main Entrance A) at [facility]: [address] [place], [place] [address] at 8:30 AM (This time is 1 hour(s) before your procedure to ensure your preparation). HOLD JARDIANCE  3 DAYS PRIOR    HOLD METFORMIN  3 DAYS PRIOR patient accidentally took this morning of 07/21/23 7am as he did not realize he was also supposed to hold his Metformin - call placed to cardio office and nurse is to call patient back with instructions and patient will call TOC RN with questions/concerns    HOLD MOUNJARO  3 DAYS PRIOR - Patient takes on Sunday  Continue taking your anticoagulant (blood thinner): Rivaroxaban  (Xarelto )07/22/23 Update: TOC reviewed record and saw note from today 07/22/23 as follows: by Janene Boer: I gave the patient his instruction, he need to be off of Mounjaro  for 7 days, off of Jardiance  for 3 days, hold metformin  the morning of the procedure. Patient took Monjarou last Sunday, spoke with Dr. Jeffrie and anesthesiologist, they are willing to do the procedure this Friday if needed, however patient may end up needing to be intubated.  He is on 2-1/2 L of oxygen  at baseline, I fear intubation may increase the risk of the procedure, after discussing with the patient, we decided to push the TEE DCCV back to next Tuesday morning.-TOC RN called and discussed with patient who verbalized understanding - TOC RN follow up call rescheduled to 6/11 - rescheduled to 6/12 after [facility] RN called 07/28/23 and patient states he didn't receive after visit summary or instructions - we called office of Maude Emmer and left message asking them to send AVS with instructions to patient's My Chart TOC RN was unable to complete assessment questions related patient stating today is his daughter's birthday and people are there for celebration - he will speak  with cardiology office about having taken the Metformin  this morning and agreed to [facility] follow up call Monday 07/26/23 10am - rescheduled for 07/28/23  - rescheduled to 6/12 after [facility] RN called 07/28/23 and patient states he didn't receive after visit summary or instructions - we called office of Maude Emmer and left message asking them to send AVS with instructions to patient's My Chart  AFIB Interventions:     Reviewed importance of adherence to anticoagulant exactly as prescribed Counseled on bleeding risk associated with Xarelto  and importance of self-monitoring for signs/symptoms of bleeding Post cardioversion on 08/16/23, states heart rate is 65 and feels regular.  Discussed typed of atrial fib, reviewed taking pulse and feeling for regularity of beats and rate.    Heart Failure Interventions:   Provided education on low sodium diet Assessed need for readable accurate scales in home Provided education about placing scale on hard, flat surface Advised patient to weigh each morning after emptying bladder Discussed importance of daily weight and advised patient to weigh and record daily  Diabetes Interventions:   Assessed patient's understanding of A1c goal: <6.5% Reviewed medications with patient and  discussed importance of medication adherence Discussed plans with patient for ongoing care management follow up and provided patient with direct contact information for care management team Reviewed scheduled/upcoming provider appointments including: PCP appointment 08/19/23 and cardiology on 08/26/23 post cardioversion on 08/16/23 as outpatient.  Assessed social determinant of health barriers Lab Results  Component Value Date   HGBA1C 5.5 05/24/2023    Patient Self Care Activities:   Attend all scheduled provider appointments Call pharmacy for medication refills 3-7 days in advance of running out of medications Call provider office for new concerns or questions  Notify RN Care Manager of TOC  call rescheduling needs Participate in Transition of Care Program/Attend TOC scheduled calls Take medications as prescribed   call office if I gain more than 2 pounds in one day or 5 pounds in one week use salt in moderation watch for swelling in feet, ankles and legs every day weigh myself daily take the blood sugar log to all doctor visits make a plan to eat healthy take medicine as prescribed Monitor SpO2 readings via home finger monitor and record due to continued feelings of being short of breath. (08/17/23)  Plan:  TOC RN will follow up with patient The patient has been provided with contact information for the care management team and has been advised to call with any health related questions or concerns.      VBCI Transitions of Care (TOC) Care Plan       Problems:  Recent Hospitalization for treatment of Heart Failure with reduced ejection fraction Knowledge Deficit Related to oxygen  therapy - patient states the only concentrator he has is a small one - Patient states he is calling [employer] to see about getting a larger unit. Conference call was made to [employer] at [phone]. We spoke with [employer] who states previously when tech was at the home patient declined the home concentrator saying it was too big to carry. TOC RN requested they return to home with a home concentrator and to make sure patient has the tubing needed and that education is provided - Emmie states patient currently has TOC (Total oxygen  concentrator) that is usually used to get patients home from the hospital and then the floor concentrator is left in the home. TOC RN requested that they send a tech out who will patiently explain the concentrator and oxygen  therapy - Emmie states someone will call patient to schedule - Update 08/24/23: Patient states tech from [employer] came and he has the home concentrator and has a good understanding now of how it works  Goal:  Over the next 30 days, the patient will not experience hospital  readmission  Interventions:  Transitions of Care: Durable Medical Equipment (DME) needs assessed with patient/caregiver, options discussed with patient/caregiver, and education provided Doctor Visits  - discussed the importance of doctor visits Arranged PCP follow-up within 7 days (Care Guide Scheduled) Hospital follow up scheduled at [facility] with Medina-Vargas, Monina C, NP Arrive by 8:25 AMAppt at 8:40 AM (20 min)    Contacted DME company for patient use of equipment - [employer] at [phone]  Reviewed cardiology appointment scheduled with Hao Meng 08/26/23 8:25am - Patient had TEE cardioversion 07/27/23 converted to NSR and is back in Afib -  *Per chart review - Home health PT was in the home 08/23/23 and communicated to cardiology Elevated bp 160/102, weight is still elevated patient's weight 312, reduce lung sound in upper and can't hear anything in lower, increase swelling in the abdominal area. The  3 days of lasix  hasn't made a difference. Please advise Nurse called patient back and note states: Spoke with pt regarding his blood pressure and swelling. Pt stated he has gained 12 lbs since 6/30. The swelling is in the lower extremities and abdomen. Pt denies shortness of breath but he is on oxygen  full time. Pt was advised before to take 80 mg of Lasix  40 mg AM and 40 mg PM) for 3 days but it did not help. Pt has been taking his blood pressure medications as prescribed. Pt took his bp while on the phone and it was 163/93. Pt denies a headache or blurred vision. Pt stated he has not been eating salty foods or processed foods. Pt was advised to go to the ED. Pt stated it was too hot to go to the ED and he would wait until tomorrow morning. Pt was again advised to go to the ED now. Pt verbalized understanding. All questions if any were answered.  - Today 08/24/23 patient reports he did not want to go to ED and instead took extra dose of Lasix  last night 7/7 and was 7lbs lighter at 306lbs this  morning and feels much better today  - PT checked sugar 97 yesterday - Message from PCP, [name] as follows: he needs to limit his sodium for sure, I would say to take additional 40 mg lasix  today and tomorrow and he has follow up with cardiology on the 10th for further recommendations. I'm adding in Hao who he is scheduled to see on the 10th Scot Ford added the following message in EPIC: ok. thank you for letting me know. Agree with additional dose of lasix  for 2 days until I can see him.  Reviewed patient's suprapubic catheter (s/p prostate cancer with seed implant 5 years ago) changed every 30 days by nurse in urology office - was changed 08/03/23 and was found to be hypoxic in the 80s while on 4 liters oxygen  and was sent to ED Update 08/24/23: Patient reports next cath change is 09/10/23 Patient treated for UTI and reports last dose of Keflex  is today 08/09/23 Discussed patient needs battery for glucometer Update 08/24/23: Patient has the battery and reports sugar yesterday was 97  AFIB Interventions:   Counseled on increased risk of stroke due to Afib and benefits of anticoagulation for stroke prevention Reviewed importance of adherence to anticoagulant exactly as prescribed Counseled on bleeding risk associated with Xarelto  and importance of self-monitoring for signs/symptoms of bleeding Counseled on seeking medical attention after a head injury or if there is blood in the urine/stool Assessed social determinant of health barriers   Heart Failure Interventions: Provided education on low sodium diet Reviewed role of diuretics in prevention of fluid overload and management of heart failure; Discussed the importance of keeping all appointments with provider  Patient Self Care Activities:  Attend all scheduled provider appointments Call pharmacy for medication refills 3-7 days in advance of running out of medications Call provider office for new concerns or questions  Notify RN Care  Manager of TOC call rescheduling needs Participate in Transition of Care Program/Attend TOC scheduled calls Take medications as prescribed   call office if I gain more than 2 pounds in one day or 5 pounds in one week use salt in moderation weigh myself daily know when to call the doctor:weight gain as stated, shortness of breath, swelling In legs/feet, hands, abdomen track symptoms and what helps feel better or worse check pulse (heart) rate once a day  make a plan to eat healthy take medicine as prescribed  Plan:  Telephone follow up appointment with care management team member scheduled for:  09/01/23 11am  The patient has been provided with contact information for the care management team and has been advised to call with any health related questions or concerns.         Patient verbalizes understanding of instructions and care plan provided today and agrees to view in MyChart. Active MyChart status and patient understanding of how to access instructions and care plan via MyChart confirmed with patient.     Telephone follow up appointment with care management team member scheduled for: 09/01/23 11am The patient has been provided with contact information for the care management team and has been advised to call with any health related questions or concerns.   Please call the care guide team at [phone] if you need to cancel or reschedule your appointment.   Please call the Suicide and Crisis Lifeline: 988 call 1-800-273-TALK (toll free, 24 hour hotline) call 911 if you are experiencing a Mental Health or Behavioral Health Crisis or need someone to talk to. Shona Prow RN, CCM [place]  VBCI-Population Health RN Care Manager [phone]

## 2023-08-24 NOTE — Telephone Encounter (Signed)
Pt made aware and agreeable to plan.

## 2023-08-24 NOTE — Telephone Encounter (Signed)
 PCP reached out to me during lunch today regarding his case. He did not go to ED as instructed. PCP instructed him to take extra 40mg  of lasix  today and tomorrow. I need to see the patient in person to evaluate him, he is currently scheduled to see me on [place]. He has pretty significant COPD and on home O2. He keep going in and out of afib with RVR and had 2 cardioversion lately. I am not sure what triggered his volume overload, but there is a chance he may have flipped back into afib. I need to see him in the clinic to decide the treatment plan, but if his symptom worsen prior to Thursday, would recommend ED again.

## 2023-08-24 NOTE — Transitions of Care (Post Inpatient/ED Visit) (Signed)
 Transition of Care week 3  Visit Note  08/24/2023  Name: Thomas [name] MRN: [id number]          DOB: May 21, 1950  Situation: Patient enrolled in [facility] 30-day program. Visit completed with patient by telephone.   Background: Patient enrolled in [facility] 30-day program. Visit completed with patient and spouse present and participating by telephone.   Initial Transition Care Management Follow-up Telephone Call    Past Medical History:  Diagnosis Date   Acute on chronic diastolic CHF (congestive heart failure) (HCC) 06/01/2017   Arthritis    Cancer (HCC) 2010   Prostate   Chronic kidney disease    Diabetes mellitus    Diabetic neuropathy (HCC)    feet   DVT (deep venous thrombosis) (HCC)    Genetic testing 06/22/2016   Mr. Weatherbee underwent genetic counseling and testing for hereditary cancer syndromes on 05/14/2016. His results were negative for mutations in all 46 genes analyzed by [facility]'s 46-gene Common Hereditary Cancers Panel. Genes analyzed include: APC, ATM, AXIN2, BARD1, BMPR1A, BRCA1, BRCA2, BRIP1, CDH1, CDKN2A, CHEK2, CTNNA1, DICER1, EPCAM, GREM1, HOXB13, KIT, MEN1, MLH1, MSH2, MSH3, MSH6, MUTYH, NB   GERD (gastroesophageal reflux disease)    Hypertension    PE (pulmonary thromboembolism) (HCC)    Pneumonia    Sleep apnea    not wearing CPAP   Suprapubic catheter (HCC) 11/17/2019    Assessment: Patient Reported Symptoms: Cognitive Cognitive Status: No symptoms reported      Neurological Neurological Review of Symptoms: No symptoms reported    HEENT HEENT Symptoms Reported: No symptoms reported      Cardiovascular Cardiovascular Symptoms Reported: Swelling in legs or feet Weight: (!) 306 lb (138.8 kg)  Respiratory      Endocrine      Gastrointestinal        Genitourinary      Integumentary      Musculoskeletal          Psychosocial           There were no vitals filed for this visit.  Medications Reviewed Today     Reviewed by Lauro Shona LABOR, RN (Registered Nurse) on 08/24/23 at 1452  Med List Status: <None>   Medication Order Taking? Sig Documenting Provider Last Dose Status Informant  Accu-Chek Softclix Lancets lancets [id number] Yes Use to test blood sugar daily [name], NP  Active Self, Pharmacy Records  Alcohol  Swabs (B-D SINGLE USE SWABS REGULAR) PADS [id number] Yes Use in testing blood sugar daily. Dx: E11.22 [name], NP  Active Self, Pharmacy Records  aspirin  EC 81 MG tablet [id number] Yes Take 81 mg by mouth daily. [provider]  Active Self, Pharmacy Records  atorvastatin  (LIPITOR) 40 MG tablet [id number] Yes TAKE 1 TABLET EVERY DAY [name], Jessica K, NP  Active Self, Pharmacy Records           Med Note [name] [name] CROME   Dju Jul 03, 2023  2:32 PM) LF: 05/13/23 for a 90ds  Blood Glucose Calibration (ACCU-CHEK AVIVA) SOLN [id number] Yes Use once daily as directed dx E11.22 [name], Jessica K, NP  Active Self, Pharmacy Records  Blood Glucose Monitoring Suppl (TRUE METRIX AIR GLUCOSE METER) w/Device KIT [id number] Yes 1 Device by Does not apply route daily as needed. E11.22 [name], NP  Active Self, Pharmacy Records  calcium  carbonate (TUMS - DOSED IN MG ELEMENTAL CALCIUM ) 500 MG chewable tablet [id number] Yes Chew 2 tablets by mouth as needed for indigestion or heartburn.  [provider]  Active Self, Pharmacy Records  Cholecalciferol  (VITAMIN D3) 50 MCG (2000 UT) TABS [id number] Yes Take 1 tablet (2,000 Units) by mouth daily.   Active Self, Pharmacy Records  furosemide  (LASIX ) 20 MG tablet [id number] Yes Take 2 tablets (40 mg total) by mouth daily. Fairy Frames, MD  Active   glucose blood (ACCU-CHEK AVIVA PLUS) test strip [id number] Yes Use to test blood sugar daily. [name], NP  Active   hydrALAZINE  (APRESOLINE ) 25 MG tablet [id number] Yes TAKE 3 TABLETS THREE TIMES DAILY [name], Jessica K, NP  Active Self, Pharmacy Records           Med Note WORLEY,  [facility] M   Thu Jul 15, 2023 11:05 AM)    isosorbide  mononitrate (IMDUR ) 30 MG 24 hr tablet [id number] Yes Take 1 tablet (30 mg total) by mouth daily. Fairy Frames, MD  Active   Lancets Misc. (ACCU-CHEK SOFTCLIX LANCET DEV) PRESSLEY [id number] Yes Use to test blood sugar daily [name], NP  Active Self, Pharmacy Records  losartan  (COZAAR ) 50 MG tablet [id number] Yes Take 0.5 tablets (25 mg total) by mouth daily. [name], NP  Active   metFORMIN  (GLUCOPHAGE ) 500 MG tablet [id number] Yes TAKE 1 TABLET TWICE DAILY WITH MEALS [name], Jessica K, NP  Active Self, Pharmacy Records           Med Note ([place], TEENA BRAVO   Tue Aug 03, 2023  5:38 PM)    metoprolol  tartrate (LOPRESSOR ) 100 MG tablet [id number] Yes Take 1 tablet (100 mg total) by mouth 2 (two) times daily. Meng, Hao, [place]  Active   Multiple Vitamin (MULTIVITAMIN ADULT PO) [id number] Yes Take 2 each by mouth daily as needed (immune system). [provider]  Active Self, Pharmacy Records  potassium chloride  SA (KLOR-CON  M) 20 MEQ tablet [id number] Yes TAKE 1 TABLET TWICE DAILY [name], Jessica K, NP  Active Self, Pharmacy Records           Med Note ([place], TEENA BRAVO   Tue Aug 03, 2023  5:39 PM)    rivaroxaban  (XARELTO ) 20 MG TABS tablet [id number] Yes Take 1 tablet (20 mg total) by mouth daily with supper. Pokhrel, Laxman, MD  Active            Med Note ([place], TEENA BRAVO   Tue Aug 03, 2023  5:39 PM)    Senna 8.7 MG CHEW [id number] Yes Chew 1 tablet by mouth as needed. [name], NP  Active Self, Pharmacy Records  spironolactone  (ALDACTONE ) 25 MG tablet [id number] Yes Take 1 tablet (25 mg total) by mouth daily. [name], NP  Active   tirzepatide  (MOUNJARO ) 15 MG/0.5ML Pen [id number] Yes Inject 15 mg into the skin once a week. Santo Stanly LABOR, MD  Active            Med Note ([place], TEENA BRAVO   Tue Aug 03, 2023  5:36 PM) Take on sundays  TRUEplus Lancets 28G MISC [id number] Yes TEST BLOOD  SUGAR EVERY DAY AS NEEDED [name], NP  Active Self, Pharmacy Records  zinc  gluconate 50 MG tablet [id number]  Take 1 tablet (50 mg total) by mouth daily.  Patient not taking: Reported on 08/24/2023   Ngetich, Dinah C, NP  Active Self, Pharmacy Records            Recommendation:   Continue Current Plan of Care  Follow Up Plan:   Telephone follow up appointment date/time:  09/01/23 11am  Shona Prow RN, CCM [place]  VBCI-Population Health RN Care Manager [phone]

## 2023-08-26 ENCOUNTER — Ambulatory Visit: Attending: Physician Assistant | Admitting: Physician Assistant

## 2023-08-26 ENCOUNTER — Ambulatory Visit: Admitting: Internal Medicine

## 2023-08-26 ENCOUNTER — Encounter: Payer: Self-pay | Admitting: Physician Assistant

## 2023-08-26 VITALS — BP 128/72 | HR 78 | Ht 69.0 in | Wt 302.0 lb

## 2023-08-26 DIAGNOSIS — I480 Paroxysmal atrial fibrillation: Secondary | ICD-10-CM

## 2023-08-26 DIAGNOSIS — I2724 Chronic thromboembolic pulmonary hypertension: Secondary | ICD-10-CM | POA: Diagnosis not present

## 2023-08-26 DIAGNOSIS — I5020 Unspecified systolic (congestive) heart failure: Secondary | ICD-10-CM | POA: Diagnosis not present

## 2023-08-26 DIAGNOSIS — Z79899 Other long term (current) drug therapy: Secondary | ICD-10-CM

## 2023-08-26 DIAGNOSIS — E119 Type 2 diabetes mellitus without complications: Secondary | ICD-10-CM

## 2023-08-26 DIAGNOSIS — I2510 Atherosclerotic heart disease of native coronary artery without angina pectoris: Secondary | ICD-10-CM | POA: Diagnosis not present

## 2023-08-26 DIAGNOSIS — I10 Essential (primary) hypertension: Secondary | ICD-10-CM | POA: Diagnosis not present

## 2023-08-26 MED ORDER — FUROSEMIDE 20 MG PO TABS: ORAL_TABLET | ORAL | Status: DC

## 2023-08-26 NOTE — Progress Notes (Signed)
 Cardiology Office Note   Date:  08/26/2023  ID:  ABHINAV MAYORQUIN, DOB 11-20-1950, MRN [id number] PCP: [name], NP  [facility] Providers Cardiologist:  Stanly DELENA Leavens, MD Cardiology APP:  Lelon Glendia DASEN, PA-C  Electrophysiologist:  Eulas FORBES Furbish, MD     History of Present Illness Thomas [name] is a 73 y.o. male with PMH of paroxysmal atrial fibrillation, HFmrEF, DVT and PE 2018, hypertension, DM2, nonobstructive CAD, OSA, prostate cancer and urinary retention with chronic suprapubic catheter.  He was previously in atrial fibrillation in 2019.  He presented to the hospital in May 2025 with palpitation and was noted to be back in A-fib with RVR.  Cardiology service was consulted.  Aspirin  discontinued.  Carvedilol  was switched to metoprolol  for better rate control.  Echocardiogram during that admission showed EF 40 to 45%, moderately reduced RV function.  He was ultimately discharged on Jardiance , Lasix  20 mg daily, hydralazine , losartan  the spironolactone .  Since the patient has not been compliant with Xarelto  at home prior to hospitalization, decision was made to continue with rate control and repeat cardioversion as outpatient after anticoagulated for 3 weeks.  I last saw the patient on 07/20/2023 at the time, he continued to be in atrial fibrillation with RVR.  He ultimately underwent cardioversion on 07/27/2023.  After the cardioversion, patient was readmitted to the hospital on 08/03/2023 with worsening shortness of breath.  According to the admission note, he had exchange of suprapubic catheter at urologist office with noted he was short of breath and hypoxic and was sent to the hospital for further management.  He initially he had some improvement after the cardioversion however shortness of breath deteriorated afterward.  He is weight increased by 14 pounds after the cardioversion.  He was hypoxic with O2 saturation of 80s.  Patient was admitted with acute  on chronic respiratory failure secondary to volume overload.  He was treated with IV Lasix .  He put out 8 L.  On the day of discharge on 6/21, he reverted back to atrial fibrillation, heart rate was well-controlled.  His weight at the time was 301 pounds.  He was set up to see A-fib clinic as outpatient.  He was seen in a-fib clinic on 08/13/2023, heart rate at the time was 89 bpm and he remained in atrial fibrillation.  He was felt likely not a candidate for ablation due to chronic O2 use.  He will be a poor candidate for class Ic drug and Multaq due to CHF.  QT interval appears to be too long for sotalol or dofetilide tightly.  Amiodarone was considered best option but however he has significant baseline COPD and the patient did not wish to take any medication that can affect his lung function.  He was agreeable to undergo another cardioversion however ultimately may rely on rate control strategy only.  He was continued Lopressor  100 mg twice a day added Xarelto .  He had another successful cardioversion on 08/16/2023.  He was seen by his PCP on 7/3, his weight has increased to 313 pounds from the baseline of 303 pounds.  He contacted our office on 08/23/2023 due to elevated weight, he has tried to take 40 mg twice a day of Lasix  for increased diuresis, however this did not help.  Blood pressure has been elevated.  He was instructed to go to the emergency room however complained it was too hard to go to the ED and wished to wait until the next day.  He never went to the emergency room.   Patient presents today for follow-up accompanied by family member.  His weight has significantly came down after taking higher diuretic for the past few days.  He is currently back to baseline dry weight.  I recommended increase Lasix  to 60 mg daily from the previous 40 mg daily.  He has the option of taking additional 20 mg as needed if his weight increased by more than 3 pounds overnight or 5 pounds in single week.  He has no lower  extremity edema.  His lung is clear but has diffusely decreased breath sound consistent with chronic pulmonary issue.  He is currently on 4 L nasal cannula 24/7.  Patient is being followed by Dr. Shellia of [facility]monology service.  ROS:   Patient complains of chronic shortness of breath, lower extremity edema has improved.  Breathing also improved over the past 2 days.  He is on 4 L oxygen  24/7 at home.  Studies Reviewed EKG Interpretation Date/Time:  Thursday August 26 2023 08:39:41 EDT Ventricular Rate:  78 PR Interval:  174 QRS Duration:  94 QT Interval:  426 QTC Calculation: 485 R Axis:   -26  Text Interpretation: Sinus rhythm with Premature atrial complexes Cannot rule out Anterior infarct , age undetermined ST & T wave abnormality, consider lateral ischemia When compared with ECG of 16-Aug-2023 12:31, Sinus rhythm has replaced Ectopic atrial rhythm QRS axis Shifted left T wave inversion now evident in Lateral leads Confirmed by Janene Boer [phone] on 08/26/2023 8:51:57 AM    Cardiac Studies & Procedures   ______________________________________________________________________________________________ CARDIAC CATHETERIZATION  CARDIAC CATHETERIZATION 07/06/2017  Conclusion  There is mild left ventricular systolic dysfunction.  LV end diastolic pressure is moderately elevated, 30 mm Hg.  The left ventricular ejection fraction is 45-50% by visual estimate.  There is no aortic valve stenosis.  Hemodynamic findings consistent with moderate to severe pulmonary hypertension.  CO 10.8 L/min; CI 4.2, Ao sat 91%; PA sat 72%; mean PCWP 21 mm Hg  Nonobstructive CAD.  Prox Cx lesion is 25% stenosed.  Non-obstructive CAD.  Moderate to severe pulmonary hypertension.  Will need management for pulmonary hypertension and diuresis.  Plan to increase Lasix  dose starting tomorrow with f/u labs to be ordered.  Findings Coronary Findings Diagnostic  Dominance: Left  Left Anterior  Descending There is mild diffuse disease throughout the vessel.  Left Circumflex Prox Cx lesion is 25% stenosed.  Intervention  No interventions have been documented.     ECHOCARDIOGRAM  ECHOCARDIOGRAM COMPLETE 07/04/2023  Narrative ECHOCARDIOGRAM REPORT    Patient Name:   Thomas [name] Date of Exam: 07/04/2023 Medical Rec #:  [id number]            Height:       69.0 in Accession #:    [id number]           Weight:       303.6 lb Date of Birth:  1951/02/11            BSA:          2.467 m Patient Age:    73 years             BP:           114/74 mmHg Patient Gender: M                    HR:           85 bpm. Exam Location:  Inpatient  Procedure: 2D Echo, Cardiac Doppler, Color Doppler and Intracardiac Opacification Agent (Both Spectral and Color Flow Doppler were utilized during procedure).  Indications:    Atrial fibrillation  History:        Patient has prior history of Echocardiogram examinations, most recent 05/13/2022. Risk Factors:Hypertension.  Sonographer:    Philomena Daring Referring Phys: [id number] ALLISON WOLFE   Sonographer Comments: Technically difficult study due to poor echo windows. IMPRESSIONS   1. Poor Echo images despite Definity . 2. Left ventricular ejection fraction, by estimation, is 40 to 45%. The left ventricle has mildly decreased function. The left ventricle demonstrates global hypokinesis. Left ventricular diastolic function could not be evaluated. 3. Right ventricular systolic function is moderately reduced. The right ventricular size is normal. 4. The mitral valve is grossly normal. No evidence of mitral valve regurgitation. No evidence of mitral stenosis. 5. The aortic valve was not well visualized. Aortic valve regurgitation is not visualized. No aortic stenosis is present.  Comparison(s): Changes from prior study are noted. LVEF worsened from 45-50% to 40-45%.  FINDINGS Left Ventricle: Left ventricular ejection fraction, by  estimation, is 40 to 45%. The left ventricle has mildly decreased function. The left ventricle demonstrates global hypokinesis. Strain was performed and the global longitudinal strain is indeterminate. The left ventricular internal cavity size was normal in size. There is no left ventricular hypertrophy. Left ventricular diastolic function could not be evaluated due to atrial fibrillation. Left ventricular diastolic function could not be evaluated.  Right Ventricle: The right ventricular size is normal. No increase in right ventricular wall thickness. Right ventricular systolic function is moderately reduced.  Left [facility]: Left atrial size was normal in size.  Right [facility]: Right atrial size was normal in size.  Pericardium: There is no evidence of pericardial effusion.  Mitral Valve: The mitral valve is grossly normal. No evidence of mitral valve regurgitation. No evidence of mitral valve stenosis.  Tricuspid Valve: The tricuspid valve is not well visualized. Tricuspid valve regurgitation is not demonstrated. No evidence of tricuspid stenosis.  Aortic Valve: The aortic valve was not well visualized. Aortic valve regurgitation is not visualized. No aortic stenosis is present.  Pulmonic Valve: The pulmonic valve was not well visualized. Pulmonic valve regurgitation is trivial. No evidence of pulmonic stenosis.  Aorta: The aortic root is normal in size and structure.  Venous: The inferior vena cava was not well visualized.  IAS/Shunts: No atrial level shunt detected by color flow Doppler.  Additional Comments: 3D was performed not requiring image post processing on an independent workstation and was indeterminate.   LEFT VENTRICLE PLAX 2D LVIDd:         5.00 cm   Diastology LVIDs:         3.80 cm   LV e' lateral: 9.36 cm/s LV PW:         1.00 cm LV IVS:        1.50 cm LVOT diam:     2.20 cm LV SV:         54 LV SV Index:   22 LVOT Area:     3.80 cm   RIGHT VENTRICLE             IVC RV S prime:     9.57 cm/s  IVC diam: 2.40 cm TAPSE (M-mode): 2.4 cm  LEFT ATRIUM             Index        RIGHT ATRIUM  Index LA diam:        4.50 cm 1.82 cm/m   RA Area:     19.10 cm LA Vol (A2C):   61.1 ml 24.77 ml/m  RA Volume:   57.60 ml  23.35 ml/m LA Vol (A4C):   37.8 ml 15.32 ml/m LA Biplane Vol: 48.3 ml 19.58 ml/m AORTIC VALVE LVOT Vmax:   83.00 cm/s LVOT Vmean:  53.000 cm/s LVOT VTI:    0.141 m  AORTA Ao Root diam: 2.50 cm Ao Asc diam:  3.70 cm   SHUNTS Systemic VTI:  0.14 m Systemic Diam: 2.20 cm  Vishnu Priya Mallipeddi Electronically signed by Diannah Late Mallipeddi Signature Date/Time: 07/04/2023/1:32:41 PM    Final          ______________________________________________________________________________________________      Risk Assessment/Calculations  CHA2DS2-VASc Score = 5   This indicates a 7.2% annual risk of stroke. The patient's score is based upon: CHF History: 1 HTN History: 1 Diabetes History: 1 Stroke History: 0 Vascular Disease History: 1 Age Score: 1 Gender Score: 0            Physical Exam VS:  BP 128/72   Pulse 78   Ht 5' 9 (1.753 m)   Wt (!) 302 lb (137 kg)   SpO2 90%   BMI 44.60 kg/m        Wt Readings from Last 3 Encounters:  08/26/23 (!) 302 lb (137 kg)  08/24/23 (!) 306 lb (138.8 kg)  08/19/23 (!) 318 lb 9.6 oz (144.5 kg)    GEN: Well nourished, well developed in no acute distress NECK: No JVD; No carotid bruits CARDIAC: RRR, no murmurs, rubs, gallops RESPIRATORY:  Clear to auscultation without rales, wheezing or rhonchi  ABDOMEN: Soft, non-tender, non-distended EXTREMITIES:  No edema; No deformity   ASSESSMENT AND PLAN  HFmrEF: Suspect related to atrial fibrillation.  Echocardiogram obtained in May 2025 showed EF 40 to 45%.  Heart rate is well-controlled, maintaining sinus rhythm based on today's EKG.  He had volume overload for the past few days, PCP recommended take extra dose of 40  mg in the afternoon, he has had a significant urinary output and his weight is now back down to his dry weight.  He has no significant lower extremity edema.  His lung is clear although has diffuse decreased breath sound due to chronic pulmonary condition.  I recommend he change Lasix  to 60 mg daily with the instruction of taking additional 20 mg as needed if his weight increased by more than 3 pounds overnight or 5 pounds in single week.  Obtain basic metabolic panel in 1 to 2 weeks.  CAD: Denies any chest pain.  On aspirin  and atorvastatin   PAF: On Xarelto .  Recently underwent to cardioversion.  Currently maintaining sinus rhythm.  Not a great candidate for antiarrhythmic therapy, only antiarrhythmic therapy he may be a option would be amiodarone, however due to significant pulmonary issue, he is hesitant to try amiodarone.  Hypertension: Blood pressure stable.  DM2: Managed by primary care provider.  On Mounjaro   Chronic thromboembolic pulmonary hypertension: Followed by Dr. Shellia of [facility] pulmonology service.  On chronic 4 L/min nasal cannula.        Dispo: Follow-up in 3 months with Dr. Arnetha  Signed, Scot Ford, PA

## 2023-08-26 NOTE — Patient Instructions (Addendum)
 Medication Instructions:  Take lasix  60 mg daily. May take additional 20 mg as needed if weight increase 3lbs overnight or 5lbs in a week *If you need a refill on your cardiac medications before your next appointment, please call your pharmacy*  Lab Work: [facility]met next Friday -09/03/23 If you have labs (blood work) drawn today and your tests are completely normal, you will receive your results only by: MyChart Message (if you have MyChart) OR A paper copy in the mail If you have any lab test that is abnormal or we need to change your treatment, we will call you to review the results.  Testing/Procedures: No testing  Follow-Up: At [facility], you and your health needs are our priority.  As part of our continuing mission to provide you with exceptional heart care, our providers are all part of one team.  This team includes your primary Cardiologist (physician) and Advanced Practice Providers or APPs (Physician Assistants and Nurse Practitioners) who all work together to provide you with the care you need, when you need it.  Your next appointment:   3 month(s)  Provider:   Stanly DELENA Leavens, MD   Other Instructions Keep record of daily weight and bring to next office visit

## 2023-08-31 ENCOUNTER — Encounter: Payer: Self-pay | Admitting: Internal Medicine

## 2023-08-31 ENCOUNTER — Ambulatory Visit: Admitting: Internal Medicine

## 2023-08-31 VITALS — BP 154/88 | HR 67 | Ht 69.0 in | Wt 305.0 lb

## 2023-08-31 DIAGNOSIS — I5022 Chronic systolic (congestive) heart failure: Secondary | ICD-10-CM

## 2023-08-31 DIAGNOSIS — G4733 Obstructive sleep apnea (adult) (pediatric): Secondary | ICD-10-CM | POA: Diagnosis not present

## 2023-08-31 DIAGNOSIS — J9611 Chronic respiratory failure with hypoxia: Secondary | ICD-10-CM | POA: Diagnosis not present

## 2023-08-31 DIAGNOSIS — R0602 Shortness of breath: Secondary | ICD-10-CM

## 2023-08-31 NOTE — Progress Notes (Signed)
 Thomas [name]    [id number]    [birth year]  Primary Care Physician:[name], Harlene POUR, NP Date of Appointment: 08/31/2023 Established Patient Visit  Chief complaint:   Chief Complaint  Patient presents with   Consult     HPI: Thomas [name] is a 73 y.o. man, former patient of Dr. Shellia with OSA on CPAP, chronic Hfpef, and +lupus Anticoagulant with history of DVT and PE in 2017 on lifelong anticoagulation. He is here for a second opinion for his chronic respiratory failure.   Interval Updates:  History of Present Illness Thomas [name] is a 73 year old male with sleep apnea and pulmonary embolism who presents with worsening breathing difficulties. He is accompanied by his daughter.  He has experienced worsening breathing difficulties over the past two years, which he attributes to the use of a recalled CPAP machine. He used this machine for four to five years and notes that he never had breathing problems prior to this. His breathing issues have progressed to the point where he requires assistance to leave his house and reach his vehicle.  He has been on oxygen  therapy since 2019, initially using it only during physical exertion when he became breathless. However, his oxygen  needs have increased over time, and he now experiences drops in oxygen  levels even while on supplemental oxygen . He participated in pulmonary rehabilitation as recently as March 2025, where he was reportedly doing well.  He has a history of sleep apnea, for which he uses a CPAP machine regularly and reports feeling good when using it. He was diagnosed with blood clots in his left leg approximately six to seven years ago, attributed to prolonged immobility during a long car ride. He has been on Xarelto  since then, although there was a brief period in May 2025 when he was off the medication due to financial reasons. He resumed Xarelto  after a month.  In October 2017, he was diagnosed with a  pulmonary embolism following a CT scan of his chest, which also revealed a DVT in his left leg. He has had multiple CT scans since then, showing no recurrence of the embolism. He was advised to remain on lifelong anticoagulation therapy due to a positive lupus anticoagulant test indicating a clotting disorder.  No history of smoking, asthma, frequent pneumonia, or bronchitis. He experiences occasional leg swelling and is managing diabetes with a low-carb, low-salt diet.  CPAP download reviewed - 100% compliance AHI 1.4, minimal leak set at PS 12 with EPR3. Reports good relief of sleep apnea symptoms.    I have reviewed the patient's family social and past medical history and updated as appropriate.   Past Medical History:  Diagnosis Date   Acute on chronic diastolic CHF (congestive heart failure) (HCC) 06/01/2017   Arthritis    Cancer (HCC) 2010   Prostate   Chronic kidney disease    Diabetes mellitus    Diabetic neuropathy (HCC)    feet   DVT (deep venous thrombosis) (HCC)    Genetic testing 06/22/2016   Mr. Inclan underwent genetic counseling and testing for hereditary cancer syndromes on 05/14/2016. His results were negative for mutations in all 46 genes analyzed by [facility]'s 46-gene Common Hereditary Cancers Panel. Genes analyzed include: APC, ATM, AXIN2, BARD1, BMPR1A, BRCA1, BRCA2, BRIP1, CDH1, CDKN2A, CHEK2, CTNNA1, DICER1, EPCAM, GREM1, HOXB13, KIT, MEN1, MLH1, MSH2, MSH3, MSH6, MUTYH, NB   GERD (gastroesophageal reflux disease)    Hypertension    PE (pulmonary  thromboembolism) (HCC)    Pneumonia    Sleep apnea    not wearing CPAP   Suprapubic catheter (HCC) 11/17/2019    Past Surgical History:  Procedure Laterality Date   CARDIOVERSION N/A 06/03/2017   Procedure: CARDIOVERSION;  Surgeon: Thomas Oneil BROCKS, MD;  Location: [facility] ENDOSCOPY;  Service: Cardiovascular;  Laterality: N/A;   CARDIOVERSION N/A 07/27/2023   Procedure: CARDIOVERSION;  Surgeon: Thomas Maude BROCKS, MD;  Location:  [facility] INVASIVE CV LAB;  Service: Cardiovascular;  Laterality: N/A;   CARDIOVERSION N/A 08/16/2023   Procedure: CARDIOVERSION;  Surgeon: Thomas Stanly LABOR, MD;  Location: [facility] INVASIVE CV LAB;  Service: Cardiovascular;  Laterality: N/A;   CATARACT EXTRACTION Left 07/31/2021   Dr.Glenn, [name]   COLONOSCOPY     COLONOSCOPY WITH PROPOFOL  N/A 10/20/2018   Procedure: COLONOSCOPY WITH PROPOFOL ;  Surgeon: Thomas Iha, MD;  Location: [facility] ENDOSCOPY;  Service: Gastroenterology;  Laterality: N/A;   HERNIA REPAIR     KNEE SURGERY     MULTIPLE TOOTH EXTRACTIONS     POLYPECTOMY  10/20/2018   Procedure: POLYPECTOMY;  Surgeon: Thomas Iha, MD;  Location: [facility] ENDOSCOPY;  Service: Gastroenterology;;   PROSTATE SURGERY     RADIOACTIVE SEED IMPLANT     RIGHT/LEFT HEART CATH AND CORONARY ANGIOGRAPHY N/A 07/06/2017   Procedure: RIGHT/LEFT HEART CATH AND CORONARY ANGIOGRAPHY;  Surgeon: Thomas Candyce RAMAN, MD;  Location: [facility] INVASIVE CV LAB;  Service: Cardiovascular;  Laterality: N/A;   SHOULDER SURGERY     TOTAL KNEE ARTHROPLASTY Right 11/19/2016   Procedure: RIGHT TOTAL KNEE ARTHROPLASTY;  Surgeon: Thomas Kay HERO, MD;  Location: [facility] OR;  Service: Orthopedics;  Laterality: Right;   uretha surgery-2014      Family History  Problem Relation Age of Onset   Breast cancer Mother 68       d.89   Breast cancer Sister 30       d.30   Leukemia Brother 18       d.20   Breast cancer Maternal Aunt 40       d.40s   Lung cancer Maternal Uncle    Prostate cancer Paternal Uncle    Prostate cancer Brother        recurred recently at age 42   Other Brother 15       spinal tumor   Cervical cancer Other 22       d.22   Cancer Sister 18       unspecified type   Cancer Maternal Uncle 87       unspecified type    Social History   Occupational History   Not on file  Tobacco Use   Smoking status: Never    Passive exposure: Never   Smokeless tobacco: Never  Vaping Use   Vaping status:  Never Used  Substance and Sexual Activity   Alcohol  use: No    Comment: never   Drug use: No   Sexual activity: Yes     Physical Exam: Blood pressure (!) 154/88, pulse 67, height 5' 9 (1.753 m), weight (!) 305 lb (138.3 kg), SpO2 97%.  Gen:      No acute distress, obese, in wheel chair.  ENT:  no nasal polyps, mucus membranes moist Lungs:    No increased respiratory effort, symmetric chest wall excursion, clear to auscultation bilaterally, no wheezes or crackles CV:         Regular rate and rhythm; no murmurs, rubs, or gallops.  2+ pitting edema   Data Reviewed:  Imaging: I have personally reviewed the CT Chest April 2024 - no parenchymal lung dsiease, reactive mediastinal lymphadenopathy, enlarged pulmonary artery.    PFTs:      No data to display           Labs: Lab Results  Component Value Date   NA 143 08/19/2023   K 4.0 08/19/2023   CO2 26 08/19/2023   GLUCOSE 125 (H) 08/19/2023   BUN 36 (H) 08/19/2023   CREATININE 1.63 (H) 08/19/2023   CALCIUM  9.1 08/19/2023   EGFR 44 (L) 08/19/2023   GFRNONAA 47 (L) 08/07/2023   Lab Results  Component Value Date   WBC 8.3 08/06/2023   HGB 11.5 (L) 08/06/2023   HCT 36.7 (L) 08/06/2023   MCV 92.0 08/06/2023   PLT 253 08/06/2023    Immunization status: Immunization History  Administered Date(s) Administered   Fluad Quad(high Dose 65+) 12/09/2018, 03/15/2020, 01/06/2021   Fluad Trivalent(High Dose 65+) 11/06/2022   Influenza, High Dose Seasonal PF 12/14/2016   Influenza,inj,Quad PF,6+ Mos 11/22/2013, 12/05/2014, 10/24/2015   Influenza-Unspecified 11/29/2017   PFIZER(Purple Top)SARS-COV-2 Vaccination 07/29/2019, 08/08/2019, 08/19/2019, 01/25/2020   Pneumococcal Conjugate-13 05/23/2014   Pneumococcal Polysaccharide-23 12/13/2012, 05/21/2016    External Records Personally Reviewed: pulmonary, cardiology, primary care  Assessment & Plan Chronic respiratory failure - multifactorial from heart failure, OSA, h/o  DVT, deconditioning - suspect heart failure likely his biggest issue - never smoker, no COPD - will obtain PFTS to rule out intrinsic lung disease, low probability.    Chronic heart failure with reduced ejection fraction EF 40-45% and right-sided heart failure.  Suspected Secondary Pulmonary Hypertension Carries diagnosis of CTEPH but hasn't had a Right heart catheterization or V/Q scan.  - Continue oxygen  therapy. - subsequent CT Chest has been negative for PE. Consider V/Q scan or right heart catheterization in the future.  - mainstay of therapy is optimizing GDMT and diuretics.  - Encourage low-salt, low-carb diet. - Encourage maintaining activity level as tolerated.  Obstructive Sleep Apnea Longstanding sleep apnea managed with CPAP providing symptomatic relief.  History of Pulmonary Embolism and Deep Vein Thrombosis in 2017 Lupus Anticoagulant Syndrome - Continue Xarelto  for lifelong anticoagulation.  Atrial Fibrillation Chronic atrial fibrillation with history of cardioversion and ablation. Requires anticoagulation to prevent thromboembolic events. - Continue Xarelto  for anticoagulation.  Diabetes Mellitus Diabetes mellitus requiring dietary management. - Encourage low-carb diet.     I spent 40 minutes on 08/31/2023 in care of this patient including face to face time and non-face to face time spent charting, review of outside records, and coordination of care.   Return to Care: Return in about 2 months (around 11/01/2023).   Verdon Gore, MD Pulmonary and Critical Care Medicine [facility] Office:[phone]

## 2023-08-31 NOTE — Patient Instructions (Signed)
 It was a pleasure to see you today!  Please schedule follow up with myself or APP in 2 months to review PFT.  If my schedule is not open yet, we will contact you with a reminder closer to that time. Please call [phone] if you haven't heard from us  a month before, and always call us  sooner if issues or concerns arise. You can also send us  a message through MyChart, but but aware that this is not to be used for urgent issues and it may take up to 5-7 days to receive a reply. Please be aware that you will likely be able to view your results before I have a chance to respond to them. Please give us  5 business days to respond to any non-urgent results.    Before your next visit I would like you to have: Full set of PFTs  VISIT SUMMARY:  During today's visit, we discussed your worsening breathing difficulties.  We reviewed your history of sleep apnea, pulmonary embolism, and other health conditions. We also discussed your current medications and the importance of continuing your oxygen  therapy and dietary management.  YOUR PLAN:  -CONGESTIVE HEART FAILURE: Congestive heart failure means your heart is not pumping blood as well as it should, leading to fluid buildup and breathing difficulties. Continue using your oxygen  therapy, and keep taking Lipitor, losartan , and spironolactone . Maintain a low-salt, low-carb diet and stay as active as you can within your limits.  -SLEEP APNEA: Sleep apnea is a condition where your breathing stops and starts during sleep. Continue using your CPAP machine.   -HISTORY OF DEEP VEIN THROMBOSIS AND PULMONARY EMBOLISM SECONDARY TO LUPUS ANTICOAGULANT SYNDROME:: A pulmonary embolism is a blood clot in the lungs which you had in 2017. Lupus anticoagulant syndrome is a clotting disorder that increases your risk of blood clots. Continue taking Xarelto  for life to manage this condition.  -ATRIAL FIBRILLATION: Atrial fibrillation is an irregular and often rapid heart rate  that can increase your risk of strokes. Continue taking Xarelto  to prevent stroke related to this.    INSTRUCTIONS:  Please continue with your current medications and therapies as discussed. Follow a low-salt, low-carb diet and stay as active as you can. If you experience any new or worsening symptoms, please contact our office immediately. I am ordering pulmonary function testing to rule out any other lung disease. Continue oxygen  for goal over 90%.

## 2023-09-01 ENCOUNTER — Telehealth: Payer: Self-pay

## 2023-09-01 DIAGNOSIS — I2510 Atherosclerotic heart disease of native coronary artery without angina pectoris: Secondary | ICD-10-CM | POA: Diagnosis not present

## 2023-09-01 DIAGNOSIS — E1122 Type 2 diabetes mellitus with diabetic chronic kidney disease: Secondary | ICD-10-CM | POA: Diagnosis not present

## 2023-09-01 DIAGNOSIS — J9621 Acute and chronic respiratory failure with hypoxia: Secondary | ICD-10-CM | POA: Diagnosis not present

## 2023-09-01 DIAGNOSIS — N390 Urinary tract infection, site not specified: Secondary | ICD-10-CM | POA: Diagnosis not present

## 2023-09-01 DIAGNOSIS — B961 Klebsiella pneumoniae [K. pneumoniae] as the cause of diseases classified elsewhere: Secondary | ICD-10-CM | POA: Diagnosis not present

## 2023-09-01 DIAGNOSIS — N1832 Chronic kidney disease, stage 3b: Secondary | ICD-10-CM | POA: Diagnosis not present

## 2023-09-01 DIAGNOSIS — I110 Hypertensive heart disease with heart failure: Secondary | ICD-10-CM | POA: Diagnosis not present

## 2023-09-01 DIAGNOSIS — D631 Anemia in chronic kidney disease: Secondary | ICD-10-CM | POA: Diagnosis not present

## 2023-09-01 DIAGNOSIS — I5043 Acute on chronic combined systolic (congestive) and diastolic (congestive) heart failure: Secondary | ICD-10-CM | POA: Diagnosis not present

## 2023-09-03 ENCOUNTER — Encounter (HOSPITAL_COMMUNITY): Payer: Self-pay | Admitting: Physician Assistant

## 2023-09-03 ENCOUNTER — Ambulatory Visit: Admitting: Adult Health

## 2023-09-03 ENCOUNTER — Ambulatory Visit (HOSPITAL_COMMUNITY)
Admission: RE | Admit: 2023-09-03 | Discharge: 2023-09-03 | Disposition: A | Discharge status: Home / Self Care | Source: Ambulatory Visit | Attending: Physician Assistant | Admitting: Physician Assistant

## 2023-09-03 VITALS — BP 128/78 | HR 83 | Ht 69.0 in | Wt 301.2 lb

## 2023-09-03 DIAGNOSIS — D6869 Other thrombophilia: Secondary | ICD-10-CM

## 2023-09-03 DIAGNOSIS — I4819 Other persistent atrial fibrillation: Secondary | ICD-10-CM | POA: Diagnosis not present

## 2023-09-03 DIAGNOSIS — I480 Paroxysmal atrial fibrillation: Secondary | ICD-10-CM

## 2023-09-03 NOTE — Progress Notes (Signed)
 Primary Care Physician: [name], NP Primary Cardiologist: Stanly DELENA Leavens, MD Electrophysiologist: Eulas FORBES Furbish, MD  Referring Physician: Dr Michele Leonor MALVA Thomas [name] is a 73 y.o. male with a history of CHF, DVT/PE 2018, HTN, DM, CAD, OSA, chronic respiratory failure, prostate cancer, atrial fibrillation who presents for follow up in the [facility].  The patient was admitted 07/02/23 with tachypalpitations. He was diuresed and started on Lopressor  for rate control. He underwent DCCV on 07/27/23. He was admitted again 08/03/23 for acute CHF. Patient was in the process of being discharged but reverted back to atrial fibrillation 08/06/2023. This was in the setting of a UTI. Patient is on Xarelto  for stroke prevention. S/p repeat DCCV on 08/16/23.   Patient returns for follow up for atrial fibrillation. He remains in SR and feels well today. He did have some weight gain, edema, and SOB post DCCV. Seen by Scot Ford 7/10 and his Lasix  was increased. His symptoms have resolved and he is back to his dry weight.   Today, he  denies symptoms of palpitations, chest pain, shortness of breath, orthopnea, PND, lower extremity edema, dizziness, presyncope, syncope, bleeding, or neurologic sequela. The patient is tolerating medications without difficulties and is otherwise without complaint today.    Atrial Fibrillation Risk Factors:  he does have symptoms or diagnosis of sleep apnea. he does not have a history of rheumatic fever.   Atrial Fibrillation Management history:  Previous antiarrhythmic drugs: none Previous cardioversions: 07/27/23, 08/16/23 Previous ablations: none Anticoagulation history: Xarelto    ROS- All systems are reviewed and negative except as per the HPI above.  Past Medical History:  Diagnosis Date   Acute on chronic diastolic CHF (congestive heart failure) (HCC) 06/01/2017   Arthritis    Cancer (HCC) 2010   Prostate   Chronic  kidney disease    Diabetes mellitus    Diabetic neuropathy (HCC)    feet   DVT (deep venous thrombosis) (HCC)    Genetic testing 06/22/2016   Mr. Ginley underwent genetic counseling and testing for hereditary cancer syndromes on 05/14/2016. His results were negative for mutations in all 46 genes analyzed by [facility]'s 46-gene Common Hereditary Cancers Panel. Genes analyzed include: APC, ATM, AXIN2, BARD1, BMPR1A, BRCA1, BRCA2, BRIP1, CDH1, CDKN2A, CHEK2, CTNNA1, DICER1, EPCAM, GREM1, HOXB13, KIT, MEN1, MLH1, MSH2, MSH3, MSH6, MUTYH, NB   GERD (gastroesophageal reflux disease)    Hypertension    PE (pulmonary thromboembolism) (HCC)    Pneumonia    Sleep apnea    not wearing CPAP   Suprapubic catheter (HCC) 11/17/2019    Current Outpatient Medications  Medication Sig Dispense Refill   Accu-Chek Softclix Lancets lancets Use to test blood sugar daily 200 each 3   Alcohol  Swabs (B-D SINGLE USE SWABS REGULAR) PADS Use in testing blood sugar daily. Dx: E11.22 100 each 3   aspirin  EC 81 MG tablet Take 81 mg by mouth daily.     atorvastatin  (LIPITOR) 40 MG tablet TAKE 1 TABLET EVERY DAY 90 tablet 3   Blood Glucose Calibration (ACCU-CHEK AVIVA) SOLN Use once daily as directed dx E11.22 1 each 2   Blood Glucose Monitoring Suppl (TRUE METRIX AIR GLUCOSE METER) w/Device KIT 1 Device by Does not apply route daily as needed. E11.22 1 kit 0   calcium  carbonate (TUMS - DOSED IN MG ELEMENTAL CALCIUM ) 500 MG chewable tablet Chew 2 tablets by mouth as needed for indigestion or heartburn.     Cholecalciferol  (VITAMIN  D3) 50 MCG (2000 UT) TABS Take 1 tablet (2,000 Units) by mouth daily. 10 tablet 0   furosemide  (LASIX ) 20 MG tablet Take 3 tablets(60 mg total) by mouth daily. May take additional 20 mg as needed if weight increase 3lbs overnight or increase 5lbs in a week.     glucose blood (ACCU-CHEK AVIVA PLUS) test strip Use to test blood sugar daily. 100 each 3   hydrALAZINE  (APRESOLINE ) 25 MG tablet TAKE 3  TABLETS THREE TIMES DAILY 810 tablet 3   isosorbide  mononitrate (IMDUR ) 30 MG 24 hr tablet Take 1 tablet (30 mg total) by mouth daily. 30 tablet 1   Lancets Misc. (ACCU-CHEK SOFTCLIX LANCET DEV) KIT Use to test blood sugar daily 1 kit 0   losartan  (COZAAR ) 50 MG tablet Take 0.5 tablets (25 mg total) by mouth daily. 90 tablet 0   metFORMIN  (GLUCOPHAGE ) 500 MG tablet TAKE 1 TABLET TWICE DAILY WITH MEALS 180 tablet 3   metoprolol  tartrate (LOPRESSOR ) 100 MG tablet Take 1 tablet (100 mg total) by mouth 2 (two) times daily. 60 tablet 11   Multiple Vitamin (MULTIVITAMIN ADULT PO) Take 2 each by mouth daily as needed (immune system).     potassium chloride  SA (KLOR-CON  M) 20 MEQ tablet TAKE 1 TABLET TWICE DAILY 180 tablet 3   rivaroxaban  (XARELTO ) 20 MG TABS tablet Take 1 tablet (20 mg total) by mouth daily with supper. 30 tablet 12   Senna 8.7 MG CHEW Chew 1 tablet by mouth as needed. 90 tablet 1   spironolactone  (ALDACTONE ) 25 MG tablet Take 1 tablet (25 mg total) by mouth daily. 90 tablet 0   tirzepatide  (MOUNJARO ) 15 MG/0.5ML Pen Inject 15 mg into the skin once a week. 2 mL 5   TRUEplus Lancets 28G MISC TEST BLOOD SUGAR EVERY DAY AS NEEDED 100 each 3   zinc  gluconate 50 MG tablet Take 1 tablet (50 mg total) by mouth daily. 14 tablet 0   No current facility-administered medications for this encounter.    Physical Exam: BP 128/78   Pulse 83   Ht 5' 9 (1.753 m)   Wt (!) 136.6 kg   BMI 44.48 kg/m   GEN: Well nourished, well developed in no acute distress CARDIAC: Regular rate and rhythm, no murmurs, rubs, gallops RESPIRATORY:  Clear to auscultation without rales, wheezing or rhonchi  ABDOMEN: Soft, non-tender, non-distended EXTREMITIES:  No edema; No deformity    Wt Readings from Last 3 Encounters:  09/03/23 (!) 136.6 kg  08/31/23 (!) 138.3 kg  08/26/23 (!) 137 kg     EKG today demonstrates  SR Vent. rate 83 BPM PR interval 180 ms QRS duration 92 ms QT/QTcB 426/500 ms   Echo  07/04/23 demonstrated  1. Poor Echo images despite Definity .   2. Left ventricular ejection fraction, by estimation, is 40 to 45%. The  left ventricle has mildly decreased function. The left ventricle  demonstrates global hypokinesis. Left ventricular diastolic function could  not be evaluated.   3. Right ventricular systolic function is moderately reduced. The right  ventricular size is normal.   4. The mitral valve is grossly normal. No evidence of mitral valve  regurgitation. No evidence of mitral stenosis.   5. The aortic valve was not well visualized. Aortic valve regurgitation  is not visualized. No aortic stenosis is present.   Comparison(s): Changes from prior study are noted. LVEF worsened from  45-50% to 40-45%.    CHA2DS2-VASc Score = 5  The patient's score is based  upon: CHF History: 1 HTN History: 1 Diabetes History: 1 Stroke History: 0 Vascular Disease History: 1 Age Score: 1 Gender Score: 0       ASSESSMENT AND PLAN: Persistent Atrial Fibrillation (ICD10:  I48.19) The patient's CHA2DS2-VASc score is 5, indicating a 7.2% annual risk of stroke.   S/p repeat DCCV 08/16/23 Patient appears to be maintaining SR His AAD options are limited, would avoid class IC and Multaq with CHF. QT too long for sotalol or dofetilide. Amiodarone is a possibility but would like to avoid as long as possible given his baseline lung disease. Likely not a candidate for ablation given chronic O2 use.  Continue Lopressor  100 mg BID Continue Xarelto  20 mg daily  Secondary Hypercoagulable State (ICD10:  D68.69) The patient is at significant risk for stroke/thromboembolism based upon his CHA2DS2-VASc Score of 5.  Continue Rivaroxaban  (Xarelto ).   HTN Stable on current regimen  OSA  Encouraged nightly CPAP  HFmrEF EF 40-45% GDMT per primary cardiology team Fluid status appears stable today    Follow up in the AF clinic in 6 months.    [facility] [address] [place], [place] [address] [phone]

## 2023-09-04 ENCOUNTER — Other Ambulatory Visit (HOSPITAL_COMMUNITY): Payer: Self-pay

## 2023-09-07 ENCOUNTER — Other Ambulatory Visit: Payer: Self-pay

## 2023-09-07 ENCOUNTER — Other Ambulatory Visit (HOSPITAL_COMMUNITY): Payer: Self-pay

## 2023-09-07 VITALS — Wt 297.0 lb

## 2023-09-07 DIAGNOSIS — I5020 Unspecified systolic (congestive) heart failure: Secondary | ICD-10-CM

## 2023-09-07 NOTE — Transitions of Care (Post Inpatient/ED Visit) (Signed)
 Transition of Care Week 5 TOC Closure  Visit Note  09/07/2023  Name: Thomas [name] MRN: [id number]          DOB: Jun 04, 1950  Situation: Patient enrolled in [facility] 30-day program. Visit completed with patient/wife by telephone.   Background: Patient has completed TOC program and agreed to [facility]   Initial Transition Care Management Follow-up Telephone Call    Past Medical History:  Diagnosis Date   Acute on chronic diastolic CHF (congestive heart failure) (HCC) 06/01/2017   Arthritis    Cancer (HCC) 2010   Prostate   Chronic kidney disease    Diabetes mellitus    Diabetic neuropathy (HCC)    feet   DVT (deep venous thrombosis) (HCC)    Genetic testing 06/22/2016   Mr. Bessey underwent genetic counseling and testing for hereditary cancer syndromes on 05/14/2016. His results were negative for mutations in all 46 genes analyzed by [facility]'s 46-gene Common Hereditary Cancers Panel. Genes analyzed include: APC, ATM, AXIN2, BARD1, BMPR1A, BRCA1, BRCA2, BRIP1, CDH1, CDKN2A, CHEK2, CTNNA1, DICER1, EPCAM, GREM1, HOXB13, KIT, MEN1, MLH1, MSH2, MSH3, MSH6, MUTYH, NB   GERD (gastroesophageal reflux disease)    Hypertension    PE (pulmonary thromboembolism) (HCC)    Pneumonia    Sleep apnea    not wearing CPAP   Suprapubic catheter (HCC) 11/17/2019    Assessment: Patient Reported Symptoms: Cognitive Cognitive Status: No symptoms reported, Normal speech and language skills, Alert and oriented to person, place, and time      Neurological Neurological Review of Symptoms: No symptoms reported    HEENT HEENT Symptoms Reported: No symptoms reported      Cardiovascular Cardiovascular Symptoms Reported: No symptoms reported Weight: 297 lb (134.7 kg) Cardiovascular Self-Management Outcome: 4 (good)  Respiratory Respiratory Symptoms Reported: Other: Other Respiratory Symptoms: Patient is on 3L at rest and 4 L with ambulation and reports 95% - denies shortness of breath    Endocrine  Endocrine Symptoms Reported: No symptoms reported Is patient diabetic?: Yes Is patient checking blood sugars at home?: Yes List most recent blood sugar readings, include date and time of day: has not checked today    Gastrointestinal Gastrointestinal Symptoms Reported: No symptoms reported      Genitourinary Genitourinary Symptoms Reported: No symptoms reported Genitourinary Comment: suprapubic catheter - keeps clean and voices understanding of cleaning  Integumentary Integumentary Symptoms Reported: No symptoms reported    Musculoskeletal Musculoskelatal Symptoms Reviewed: No symptoms reported        Psychosocial           There were no vitals filed for this visit.  Medications Reviewed Today     Reviewed by Lauro Shona LABOR, RN (Registered Nurse) on 09/07/23 at 1012  Med List Status: <None>   Medication Order Taking? Sig Documenting Provider Last Dose Status Informant  Accu-Chek Softclix Lancets lancets [id number] Yes Use to test blood sugar daily [name], NP  Active Self, Pharmacy Records  Alcohol  Swabs (B-D SINGLE USE SWABS REGULAR) PADS [id number] Yes Use in testing blood sugar daily. Dx: E11.22 [name], NP  Active Self, Pharmacy Records  aspirin  EC 81 MG tablet [id number] Yes Take 81 mg by mouth daily. [provider]  Active Self, Pharmacy Records  atorvastatin  (LIPITOR) 40 MG tablet [id number] Yes TAKE 1 TABLET EVERY DAY Caro, Jessica K, NP  Active Self, Pharmacy Records           Med Note [name] [name] CROME   Dju Jul 03, 2023  2:32 PM)  LF: 05/13/23 for a 90ds  Blood Glucose Calibration (ACCU-CHEK AVIVA) SOLN [id number] Yes Use once daily as directed dx E11.22 [name], Jessica K, NP  Active Self, Pharmacy Records  Blood Glucose Monitoring Suppl (TRUE METRIX AIR GLUCOSE METER) w/Device KIT [id number] Yes 1 Device by Does not apply route daily as needed. E11.22 [name], NP  Active Self, Pharmacy Records  calcium  carbonate (TUMS -  DOSED IN MG ELEMENTAL CALCIUM ) 500 MG chewable tablet [id number] Yes Chew 2 tablets by mouth as needed for indigestion or heartburn. [provider]  Active Self, Pharmacy Records  Cholecalciferol  (VITAMIN D3) 50 MCG (2000 UT) TABS [id number]  Take 1 tablet (2,000 Units) by mouth daily.  Patient not taking: Reported on 09/07/2023     Active Self, Pharmacy Records  furosemide  (LASIX ) 20 MG tablet [id number] Yes Take 3 tablets(60 mg total) by mouth daily. May take additional 20 mg as needed if weight increase 3lbs overnight or increase 5lbs in a week. Meng, Hao, [place]  Active   glucose blood (ACCU-CHEK AVIVA PLUS) test strip [id number] Yes Use to test blood sugar daily. [name], NP  Active   hydrALAZINE  (APRESOLINE ) 25 MG tablet [id number] Yes TAKE 3 TABLETS THREE TIMES DAILY [name], Jessica K, NP  Active Self, Pharmacy Records           Med Note WORLEY, [facility] M   Thu Jul 15, 2023 11:05 AM)    isosorbide  mononitrate (IMDUR ) 30 MG 24 hr tablet [id number] Yes Take 1 tablet (30 mg total) by mouth daily. Fairy Frames, MD  Active   Lancets Misc. (ACCU-CHEK SOFTCLIX LANCET DEV) PRESSLEY [id number] Yes Use to test blood sugar daily [name], NP  Active Self, Pharmacy Records  losartan  (COZAAR ) 50 MG tablet [id number] Yes Take 0.5 tablets (25 mg total) by mouth daily. [name], NP  Active   metFORMIN  (GLUCOPHAGE ) 500 MG tablet [id number] Yes TAKE 1 TABLET TWICE DAILY WITH MEALS [name], Jessica K, NP  Active Self, Pharmacy Records           Med Note (SATTERFIELD, TEENA BRAVO   Tue Aug 03, 2023  5:38 PM)    metoprolol  tartrate (LOPRESSOR ) 100 MG tablet [id number] Yes Take 1 tablet (100 mg total) by mouth 2 (two) times daily. Meng, Hao, [place]  Active   Multiple Vitamin (MULTIVITAMIN ADULT PO) [id number] Yes Take 2 each by mouth daily as needed (immune system). [provider]  Active Self, Pharmacy Records  potassium chloride  SA (KLOR-CON  M) 20 MEQ tablet [id number] Yes TAKE 1  TABLET TWICE DAILY [name], Jessica K, NP  Active Self, Pharmacy Records           Med Note (SATTERFIELD, TEENA BRAVO   Tue Aug 03, 2023  5:39 PM)    rivaroxaban  (XARELTO ) 20 MG TABS tablet [id number] Yes Take 1 tablet (20 mg total) by mouth daily with supper. Pokhrel, Laxman, MD  Active            Med Note (SATTERFIELD, TEENA BRAVO   Tue Aug 03, 2023  5:39 PM)    Senna 8.7 MG CHEW [id number] Yes Chew 1 tablet by mouth as needed. [name], NP  Active Self, Pharmacy Records  spironolactone  (ALDACTONE ) 25 MG tablet [id number] Yes Take 1 tablet (25 mg total) by mouth daily. [name], NP  Active   tirzepatide  (MOUNJARO ) 15 MG/0.5ML Pen [id number] Yes Inject 15 mg into the skin once a week. Santo Stanly LABOR, MD  Active  Med Note (SATTERFIELD, TEENA BRAVO   Tue Aug 03, 2023  5:36 PM) Take on sundays  TRUEplus Lancets 28G MISC [id number] Yes TEST BLOOD SUGAR EVERY DAY AS NEEDED [name], NP  Active Self, Pharmacy Records  zinc  gluconate 50 MG tablet [id number]  Take 1 tablet (50 mg total) by mouth daily.  Patient not taking: Reported on 09/07/2023   Ngetich, Roxan BROCKS, NP  Active Self, Pharmacy Records            Recommendation:   Referral to: CCM  Follow Up Plan:   Referral to RN Case Manager Closing From:  Transitions of Care Program  Shona Prow RN, CCM [facility]  [employer]Population [employer] RN Care Manager [phone]

## 2023-09-08 ENCOUNTER — Telehealth: Payer: Self-pay

## 2023-09-08 ENCOUNTER — Other Ambulatory Visit (HOSPITAL_COMMUNITY): Payer: Self-pay

## 2023-09-09 ENCOUNTER — Telehealth: Payer: Self-pay

## 2023-09-09 NOTE — Progress Notes (Signed)
 Complex Care Management Note  Care Guide Note 09/09/2023 Name: Thomas [name] MRN: [id number] DOB: 1950-08-26  Thomas [name] is a 73 y.o. year old male who sees [name], Harlene POUR, NP for primary care. I reached out to Leonor MALVA Spates by phone today to offer complex care management services.  Thomas [name] was given information about Complex Care Management services today including:   The Complex Care Management services include support from the care team which includes your Nurse Care Manager, Clinical Social Worker, or Pharmacist.  The Complex Care Management team is here to help remove barriers to the health concerns and goals most important to you. Complex Care Management services are voluntary, and the patient may decline or stop services at any time by request to their care team member.   Complex Care Management Consent Status: Patient agreed to services and verbal consent obtained.   Follow up plan:  Telephone appointment with complex care management team member scheduled for:  10/05/23 @ 11 AM.  Encounter Outcome:  Patient Scheduled   [name] [facility] Guide  Direct Dial: [phone]  Fax [phone]

## 2023-09-10 ENCOUNTER — Other Ambulatory Visit (HOSPITAL_COMMUNITY): Payer: Self-pay

## 2023-09-10 DIAGNOSIS — R339 Retention of urine, unspecified: Secondary | ICD-10-CM | POA: Diagnosis not present

## 2023-09-10 DIAGNOSIS — I480 Paroxysmal atrial fibrillation: Secondary | ICD-10-CM | POA: Diagnosis not present

## 2023-09-10 DIAGNOSIS — Z466 Encounter for fitting and adjustment of urinary device: Secondary | ICD-10-CM | POA: Diagnosis not present

## 2023-09-11 LAB — BASIC METABOLIC PANEL WITH GFR
BUN/Creatinine Ratio: 17 (ref 10–24)
BUN: 26 mg/dL (ref 8–27)
CO2: 19 mmol/L — ABNORMAL LOW (ref 20–29)
Calcium: 9 mg/dL (ref 8.6–10.2)
Chloride: 105 mmol/L (ref 96–106)
Creatinine, Ser: 1.53 mg/dL — ABNORMAL HIGH (ref 0.76–1.27)
Glucose: 159 mg/dL — ABNORMAL HIGH (ref 70–99)
Potassium: 4.3 mmol/L (ref 3.5–5.2)
Sodium: 143 mmol/L (ref 134–144)
eGFR: 48 mL/min/1.73 — ABNORMAL LOW (ref >59)

## 2023-09-13 ENCOUNTER — Ambulatory Visit (HOSPITAL_COMMUNITY): Payer: Self-pay | Admitting: Physician Assistant

## 2023-09-16 DIAGNOSIS — B961 Klebsiella pneumoniae [K. pneumoniae] as the cause of diseases classified elsewhere: Secondary | ICD-10-CM | POA: Diagnosis not present

## 2023-09-16 DIAGNOSIS — E1122 Type 2 diabetes mellitus with diabetic chronic kidney disease: Secondary | ICD-10-CM | POA: Diagnosis not present

## 2023-09-16 DIAGNOSIS — I2510 Atherosclerotic heart disease of native coronary artery without angina pectoris: Secondary | ICD-10-CM | POA: Diagnosis not present

## 2023-09-16 DIAGNOSIS — I110 Hypertensive heart disease with heart failure: Secondary | ICD-10-CM | POA: Diagnosis not present

## 2023-09-16 DIAGNOSIS — I5043 Acute on chronic combined systolic (congestive) and diastolic (congestive) heart failure: Secondary | ICD-10-CM | POA: Diagnosis not present

## 2023-09-16 DIAGNOSIS — N1832 Chronic kidney disease, stage 3b: Secondary | ICD-10-CM | POA: Diagnosis not present

## 2023-09-16 DIAGNOSIS — J9621 Acute and chronic respiratory failure with hypoxia: Secondary | ICD-10-CM | POA: Diagnosis not present

## 2023-09-16 DIAGNOSIS — D631 Anemia in chronic kidney disease: Secondary | ICD-10-CM | POA: Diagnosis not present

## 2023-09-16 DIAGNOSIS — N390 Urinary tract infection, site not specified: Secondary | ICD-10-CM | POA: Diagnosis not present

## 2023-09-17 DIAGNOSIS — R339 Retention of urine, unspecified: Secondary | ICD-10-CM | POA: Diagnosis not present

## 2023-09-22 ENCOUNTER — Other Ambulatory Visit: Payer: Self-pay | Admitting: Nurse Practitioner

## 2023-09-22 DIAGNOSIS — I10 Essential (primary) hypertension: Secondary | ICD-10-CM

## 2023-09-23 ENCOUNTER — Other Ambulatory Visit (HOSPITAL_COMMUNITY): Payer: Self-pay

## 2023-09-29 ENCOUNTER — Telehealth: Payer: Self-pay

## 2023-09-29 DIAGNOSIS — I5043 Acute on chronic combined systolic (congestive) and diastolic (congestive) heart failure: Secondary | ICD-10-CM | POA: Diagnosis not present

## 2023-09-29 DIAGNOSIS — N390 Urinary tract infection, site not specified: Secondary | ICD-10-CM | POA: Diagnosis not present

## 2023-09-29 DIAGNOSIS — I110 Hypertensive heart disease with heart failure: Secondary | ICD-10-CM | POA: Diagnosis not present

## 2023-09-29 DIAGNOSIS — N1832 Chronic kidney disease, stage 3b: Secondary | ICD-10-CM | POA: Diagnosis not present

## 2023-09-29 DIAGNOSIS — E1122 Type 2 diabetes mellitus with diabetic chronic kidney disease: Secondary | ICD-10-CM | POA: Diagnosis not present

## 2023-09-29 DIAGNOSIS — J9621 Acute and chronic respiratory failure with hypoxia: Secondary | ICD-10-CM | POA: Diagnosis not present

## 2023-09-29 DIAGNOSIS — I2510 Atherosclerotic heart disease of native coronary artery without angina pectoris: Secondary | ICD-10-CM | POA: Diagnosis not present

## 2023-09-29 DIAGNOSIS — B961 Klebsiella pneumoniae [K. pneumoniae] as the cause of diseases classified elsewhere: Secondary | ICD-10-CM | POA: Diagnosis not present

## 2023-09-29 DIAGNOSIS — D631 Anemia in chronic kidney disease: Secondary | ICD-10-CM | POA: Diagnosis not present

## 2023-09-29 NOTE — Telephone Encounter (Signed)
 Copied from CRM [id number]. Topic: Clinical - Medical Advice >> Sep 29, 2023  3:37 PM Susanna ORN wrote: Reason for CRM: Signe Eastern, Physical Therapist, with [facility], called stating that he saw patient today. States he has mild to moderate pain and stated it's just an BURUNDI. Signe stated that patient has pain in right heel in the last day or so. Pain ranges anywhere from 0 to 5 & he's not putting weight on it. States it was throbbing last night when laying down. Signe stated he examined him but there is no breaks or tears in skin. Signe stated if there are any questions, please give him a call back at [phone] and if there are any recommendations, please give patient a call.

## 2023-09-29 NOTE — Telephone Encounter (Signed)
 Would recommend evaluation in office if patient agreeable.  He can try to ice area three times daily in the meantime and use tylenol  325 mg- 2 tablets -650 mg by mouth every 6 hours as needed for pain

## 2023-09-29 NOTE — Telephone Encounter (Signed)
 Message from Physical Therapist Signe Eastern sent to PCP [name], NP

## 2023-09-30 ENCOUNTER — Telehealth (HOSPITAL_BASED_OUTPATIENT_CLINIC_OR_DEPARTMENT_OTHER): Payer: Self-pay

## 2023-09-30 NOTE — Telephone Encounter (Signed)
 Message routed to clinical intake for today Bethany.B/CMA to call back and follow up.

## 2023-09-30 NOTE — Telephone Encounter (Signed)
 Thank you for the update!

## 2023-09-30 NOTE — Telephone Encounter (Signed)
 Patient did call back this afternoon stating that he is better but will be coming to the appointment on Next Monday.  Message sent to [name], NP

## 2023-09-30 NOTE — Telephone Encounter (Signed)
 Copied from CRM [id number]. Topic: General - Other >> Sep 29, 2023  3:24 PM Tianna S wrote: Reason for CRM: when I listen to his lungs with stethoscope, upper left his normal, he has reduced general in upper left, bottom and upper right he is reduced of his normal, and I can't hear anything in the bottom right. He says he is coughing up his regular clear sputum, and as far as his weight 284, and when saw on the 31 it was 285, so sounds like not a significant weight change. [phone], jim, pt bauda, home health, any recommendations changes please call the patient directly

## 2023-09-30 NOTE — Telephone Encounter (Signed)
 Left a detail voicemail for the patient and the Physical Therapy to give an update of the response from [name], NP. Patient does have an appointment with [name], NP to be seen in office on Next Monday. [name], NP has been notified   Message sent to [name], NP

## 2023-10-04 ENCOUNTER — Ambulatory Visit (INDEPENDENT_AMBULATORY_CARE_PROVIDER_SITE_OTHER): Admitting: Nurse Practitioner

## 2023-10-04 ENCOUNTER — Encounter: Payer: Self-pay | Admitting: Nurse Practitioner

## 2023-10-04 ENCOUNTER — Other Ambulatory Visit (HOSPITAL_COMMUNITY): Payer: Self-pay

## 2023-10-04 VITALS — BP 136/80 | HR 76 | Temp 97.4°F | Ht 69.0 in | Wt 298.0 lb

## 2023-10-04 DIAGNOSIS — D509 Iron deficiency anemia, unspecified: Secondary | ICD-10-CM | POA: Diagnosis not present

## 2023-10-04 DIAGNOSIS — Z86718 Personal history of other venous thrombosis and embolism: Secondary | ICD-10-CM | POA: Diagnosis not present

## 2023-10-04 DIAGNOSIS — E785 Hyperlipidemia, unspecified: Secondary | ICD-10-CM | POA: Diagnosis not present

## 2023-10-04 DIAGNOSIS — I2510 Atherosclerotic heart disease of native coronary artery without angina pectoris: Secondary | ICD-10-CM

## 2023-10-04 DIAGNOSIS — E1122 Type 2 diabetes mellitus with diabetic chronic kidney disease: Secondary | ICD-10-CM

## 2023-10-04 DIAGNOSIS — I5022 Chronic systolic (congestive) heart failure: Secondary | ICD-10-CM

## 2023-10-04 DIAGNOSIS — J9611 Chronic respiratory failure with hypoxia: Secondary | ICD-10-CM

## 2023-10-04 DIAGNOSIS — N401 Enlarged prostate with lower urinary tract symptoms: Secondary | ICD-10-CM

## 2023-10-04 DIAGNOSIS — I480 Paroxysmal atrial fibrillation: Secondary | ICD-10-CM | POA: Diagnosis not present

## 2023-10-04 DIAGNOSIS — G4733 Obstructive sleep apnea (adult) (pediatric): Secondary | ICD-10-CM

## 2023-10-04 DIAGNOSIS — N1832 Chronic kidney disease, stage 3b: Secondary | ICD-10-CM

## 2023-10-04 DIAGNOSIS — N138 Other obstructive and reflux uropathy: Secondary | ICD-10-CM

## 2023-10-04 MED ORDER — ISOSORBIDE MONONITRATE ER 30 MG PO TB24
30.0000 mg | ORAL_TABLET | Freq: Every day | ORAL | 3 refills | Status: AC
  Filled 2023-10-04 – 2023-10-06 (×2): qty 90, 90d supply, fill #0
  Filled 2024-01-10: qty 90, 90d supply, fill #1

## 2023-10-04 NOTE — Assessment & Plan Note (Signed)
 Ongoing, continues on O2

## 2023-10-04 NOTE — Assessment & Plan Note (Signed)
 Ongoing shortness of breath, also being evaluated by pulmonary but felt that shortness of breath likely coming from heart failure Continues on lasix  60 mg daily with aldactone  25 mg daily with lopressor , hydralazine  and losartan .

## 2023-10-04 NOTE — Assessment & Plan Note (Signed)
Continues on cpap

## 2023-10-04 NOTE — Assessment & Plan Note (Signed)
-  education provided on healthy weight loss through increase in physical activity and proper nutrition  -continues on mounjaro

## 2023-10-04 NOTE — Assessment & Plan Note (Signed)
 Continues on lipitor with dietary modifications

## 2023-10-04 NOTE — Progress Notes (Signed)
 Careteam: Patient Care Team: [name], NP as PCP - General (Geriatric Medicine) Mealor, Eulas BRAVO, MD as PCP - Electrophysiology (Cardiology) Santo Stanly LABOR, MD as PCP - Cardiology (Cardiology) Morgan Clayborne CROME, RN as [facility] Management Lelon Glendia ONEIDA DEVONNA as Physician Assistant (Cardiology) Marcey Elspeth PARAS, MD as Consulting Physician (Ophthalmology)  PLACE OF SERVICE:  [facility]  Advanced Directive information    Allergies  Allergen Reactions   Lisinopril  Cough and Other (See Comments)    Muscle pain    Chief Complaint  Patient presents with   Medical Management of Chronic Issues    Routine visit.     HPI:  Discussed the use of AI scribe software for clinical note transcription with the patient, who gave verbal consent to proceed.  History of Present Illness Thomas [name] is a 73 year old male with heart failure and atrial fibrillation who presents for a follow-up after hospitalization.  He is following up after a hospitalization in July for heart failure. He has no changes in weight and is currently on three liters of oxygen . No swelling. His diuretic regimen includes Lasix  60 mg by mouth daily with an extra dose if needed, and spironolactone  25 mg daily. He also takes a potassium supplement. Recent blood work showed stable kidney function and good potassium levels.  He is concerned about breathing difficulties and mentions a therapist noted that his right lung is not filling up as it should. He is scheduled for pulmonary function tests in October. He experiences shortness of breath with activity but denies wheezing, cough, or congestion. He uses a CPAP machine and reports clear mucus production.  He has a history of atrial fibrillation and is on Xarelto  and metoprolol . No unusual bleeding, bruising, or chest pain. He also takes isosorbide  mononitrate 30 mg daily and requires a refill.  He has a history of iron  deficiency anemia, attributed  to a previous incident involving significant blood loss during a medical procedure. Recent blood work showed stable cholesterol and A1c levels. He is on Mounjaro  and has lost four pounds since July 18th.  He underwent pulmonary rehab in March and has not had any recent appointments with the pulmonologist. No indigestion and has only used Tums in the past. He is not on any iron  supplements.    Review of Systems:  Review of Systems  Constitutional:  Negative for chills, fever and weight loss.  HENT:  Negative for tinnitus.   Respiratory:  Positive for sputum production and shortness of breath. Negative for cough.   Cardiovascular:  Negative for chest pain, palpitations and leg swelling.  Gastrointestinal:  Negative for abdominal pain, constipation, diarrhea and heartburn.  Genitourinary:  Negative for dysuria, frequency and urgency.  Musculoskeletal:  Positive for joint pain. Negative for back pain, falls and myalgias.  Skin: Negative.   Neurological:  Negative for dizziness and headaches.  Psychiatric/Behavioral:  Negative for depression and memory loss. The patient does not have insomnia.     Past Medical History:  Diagnosis Date   Acute on chronic diastolic CHF (congestive heart failure) (HCC) 06/01/2017   Arthritis    Cancer (HCC) 2010   Prostate   Chronic kidney disease    Diabetes mellitus    Diabetic neuropathy (HCC)    feet   DVT (deep venous thrombosis) (HCC)    Genetic testing 06/22/2016   Mr. Gahm underwent genetic counseling and testing for hereditary cancer syndromes on 05/14/2016. His results were negative for mutations in all  46 genes analyzed by [facility]'s 46-gene Common Hereditary Cancers Panel. Genes analyzed include: APC, ATM, AXIN2, BARD1, BMPR1A, BRCA1, BRCA2, BRIP1, CDH1, CDKN2A, CHEK2, CTNNA1, DICER1, EPCAM, GREM1, HOXB13, KIT, MEN1, MLH1, MSH2, MSH3, MSH6, MUTYH, NB   GERD (gastroesophageal reflux disease)    Hypertension    PE (pulmonary thromboembolism)  (HCC)    Pneumonia    Sleep apnea    not wearing CPAP   Suprapubic catheter (HCC) 11/17/2019   Past Surgical History:  Procedure Laterality Date   CARDIOVERSION N/A 06/03/2017   Procedure: CARDIOVERSION;  Surgeon: Jeffrie Oneil BROCKS, MD;  Location: [facility];  Service: Cardiovascular;  Laterality: N/A;   CARDIOVERSION N/A 07/27/2023   Procedure: CARDIOVERSION;  Surgeon: Delford Maude BROCKS, MD;  Location: [facility] INVASIVE CV LAB;  Service: Cardiovascular;  Laterality: N/A;   CARDIOVERSION N/A 08/16/2023   Procedure: CARDIOVERSION;  Surgeon: Santo Stanly LABOR, MD;  Location: [facility] INVASIVE CV LAB;  Service: Cardiovascular;  Laterality: N/A;   CATARACT EXTRACTION Left 07/31/2021   Dr.Glenn, [facility]   COLONOSCOPY     COLONOSCOPY WITH PROPOFOL  N/A 10/20/2018   Procedure: COLONOSCOPY WITH PROPOFOL ;  Surgeon: Eda Iha, MD;  Location: [facility];  Service: Gastroenterology;  Laterality: N/A;   HERNIA REPAIR     KNEE SURGERY     MULTIPLE TOOTH EXTRACTIONS     POLYPECTOMY  10/20/2018   Procedure: POLYPECTOMY;  Surgeon: Eda Iha, MD;  Location: [facility];  Service: Gastroenterology;;   PROSTATE SURGERY     RADIOACTIVE SEED IMPLANT     RIGHT/LEFT HEART CATH AND CORONARY ANGIOGRAPHY N/A 07/06/2017   Procedure: RIGHT/LEFT HEART CATH AND CORONARY ANGIOGRAPHY;  Surgeon: Dann Candyce RAMAN, MD;  Location: [facility] INVASIVE CV LAB;  Service: Cardiovascular;  Laterality: N/A;   SHOULDER SURGERY     TOTAL KNEE ARTHROPLASTY Right 11/19/2016   Procedure: RIGHT TOTAL KNEE ARTHROPLASTY;  Surgeon: Jerri Kay HERO, MD;  Location: [facility] OR;  Service: Orthopedics;  Laterality: Right;   uretha surgery-2014     Social History:   reports that he has never smoked. He has never been exposed to tobacco smoke. He has never used smokeless tobacco. He reports that he does not drink alcohol  and does not use drugs.  Family History  Problem Relation Age of Onset   Breast cancer Mother 60        d.89   Breast cancer Sister 30       d.30   Leukemia Brother 18       d.20   Breast cancer Maternal Aunt 40       d.40s   Lung cancer Maternal Uncle    Prostate cancer Paternal Uncle    Prostate cancer Brother        recurred recently at age 68   Other Brother 15       spinal tumor   Cervical cancer Other 22       d.22   Cancer Sister 46       unspecified type   Cancer Maternal Uncle 22       unspecified type    Medications: Patient's Medications  New Prescriptions   No medications on file  Previous Medications   ACCU-CHEK SOFTCLIX LANCETS LANCETS    Use to test blood sugar daily   ALCOHOL  SWABS (B-D SINGLE USE SWABS REGULAR) PADS    Use in testing blood sugar daily. Dx: E11.22   ASPIRIN  EC 81 MG TABLET    Take 81 mg by mouth daily.   ATORVASTATIN  (  LIPITOR) 40 MG TABLET    TAKE 1 TABLET EVERY DAY   BLOOD GLUCOSE CALIBRATION (ACCU-CHEK AVIVA) SOLN    Use once daily as directed dx E11.22   CALCIUM  CARBONATE (TUMS - DOSED IN MG ELEMENTAL CALCIUM ) 500 MG CHEWABLE TABLET    Chew 2 tablets by mouth as needed for indigestion or heartburn.   FUROSEMIDE  (LASIX ) 20 MG TABLET    Take 3 tablets(60 mg total) by mouth daily. May take additional 20 mg as needed if weight increase 3lbs overnight or increase 5lbs in a week.   GLUCOSE BLOOD (ACCU-CHEK AVIVA PLUS) TEST STRIP    Use to test blood sugar daily.   HYDRALAZINE  (APRESOLINE ) 25 MG TABLET    TAKE 3 TABLETS THREE TIMES DAILY   LANCETS MISC. (ACCU-CHEK SOFTCLIX LANCET DEV) KIT    Use to test blood sugar daily   LOSARTAN  (COZAAR ) 50 MG TABLET    Take 0.5 tablets (25 mg total) by mouth daily.   METFORMIN  (GLUCOPHAGE ) 500 MG TABLET    TAKE 1 TABLET TWICE DAILY WITH MEALS   METOPROLOL  TARTRATE (LOPRESSOR ) 100 MG TABLET    Take 1 tablet (100 mg total) by mouth 2 (two) times daily.   MULTIPLE VITAMIN (MULTIVITAMIN ADULT PO)    Take 2 each by mouth daily as needed (immune system).   POTASSIUM CHLORIDE  SA (KLOR-CON  M) 20 MEQ TABLET    TAKE 1  TABLET TWICE DAILY   RIVAROXABAN  (XARELTO ) 20 MG TABS TABLET    Take 1 tablet (20 mg total) by mouth daily with supper.   SENNA 8.7 MG CHEW    Chew 1 tablet by mouth as needed.   SPIRONOLACTONE  (ALDACTONE ) 25 MG TABLET    Take 1 tablet (25 mg total) by mouth daily.   TIRZEPATIDE  (MOUNJARO ) 15 MG/0.5ML PEN    Inject 15 mg into the skin once a week.  Modified Medications   Modified Medication Previous Medication   ISOSORBIDE  MONONITRATE (IMDUR ) 30 MG 24 HR TABLET isosorbide  mononitrate (IMDUR ) 30 MG 24 hr tablet      Take 1 tablet (30 mg total) by mouth daily.    Take 1 tablet (30 mg total) by mouth daily.  Discontinued Medications   BLOOD GLUCOSE MONITORING SUPPL (TRUE METRIX AIR GLUCOSE METER) W/DEVICE KIT    1 Device by Does not apply route daily as needed. E11.22   CHOLECALCIFEROL  (VITAMIN D3) 50 MCG (2000 UT) TABS    Take 1 tablet (2,000 Units) by mouth daily.   TRUEPLUS LANCETS 28G MISC    TEST BLOOD SUGAR EVERY DAY AS NEEDED   ZINC  GLUCONATE 50 MG TABLET    Take 1 tablet (50 mg total) by mouth daily.    Physical Exam:  Vitals:   10/04/23 0918  BP: 136/80  Pulse: 76  Temp: (!) 97.4 F (36.3 C)  SpO2: 95%  Weight: 298 lb (135.2 kg)  Height: 5' 9 (1.753 m)   Body mass index is 44.01 kg/m. Wt Readings from Last 3 Encounters:  10/04/23 298 lb (135.2 kg)  09/07/23 297 lb (134.7 kg)  09/03/23 (!) 301 lb 3.2 oz (136.6 kg)    Physical Exam Constitutional:      General: He is not in acute distress.    Appearance: He is well-developed. He is not diaphoretic.  HENT:     Head: Normocephalic and atraumatic.     Right Ear: External ear normal.     Left Ear: External ear normal.     Mouth/Throat:  Pharynx: No oropharyngeal exudate.  Eyes:     Conjunctiva/sclera: Conjunctivae normal.     Pupils: Pupils are equal, round, and reactive to light.  Cardiovascular:     Rate and Rhythm: Normal rate and regular rhythm.     Heart sounds: Normal heart sounds.  Pulmonary:      Effort: Pulmonary effort is normal.     Breath sounds: Normal breath sounds.  Abdominal:     General: Bowel sounds are normal.     Palpations: Abdomen is soft.  Musculoskeletal:        General: No tenderness.     Cervical back: Normal range of motion and neck supple.     Right lower leg: No edema.     Left lower leg: No edema.  Skin:    General: Skin is warm and dry.  Neurological:     Mental Status: He is alert and oriented to person, place, and time.     Motor: Weakness present.     Gait: Gait abnormal.     Labs reviewed: Basic Metabolic Panel: Recent Labs    07/03/23 1730 07/04/23 0455 07/05/23 0546 07/06/23 0955 07/15/23 1206 08/02/23 1109 08/06/23 1118 08/07/23 1055 08/19/23 0934 09/10/23 1059  NA  --    < > 139   < > 145   < >  --  144 143 143  K  --    < > 4.1   < > 4.5   < >  --  3.9 4.0 4.3  CL  --    < > 109   < > 109   < >  --  108 108 105  CO2  --    < > 23   < > 26   < >  --  24 26 19*  GLUCOSE  --    < > 130*   < > 111*   < >  --  168* 125* 159*  BUN  --    < > 26*   < > 27*   < >  --  29* 36* 26  CREATININE  --    < > 1.65*   < > 1.77*   < >  --  1.56* 1.63* 1.53*  CALCIUM   --    < > 8.3*   < > 8.8   < >  --  8.2* 9.1 9.0  MG  --   --  2.2  --  2.0  --  2.2  --   --   --   TSH 1.058  --   --   --   --   --   --   --   --   --    < > = values in this interval not displayed.   Liver Function Tests: Recent Labs    11/03/22 1000 05/24/23 1034 08/02/23 1109 08/03/23 1238  AST 21 12 15 25   ALT 36 14 34 38  ALKPHOS 94  --   --  56  BILITOT 0.6 0.6 1.0 1.7*  PROT 7.3 7.4 6.8 7.3  ALBUMIN 4.2  --   --  3.7   No results for input(s): LIPASE, AMYLASE in the last 8760 hours. No results for input(s): AMMONIA in the last 8760 hours. CBC: Recent Labs    07/15/23 1206 08/02/23 1109 08/03/23 1238 08/04/23 0137 08/05/23 0918 08/06/23 0307  WBC 9.9 9.1 8.8 8.6 7.4 8.3  NEUTROABS 7,999* 7,680 7.3  --   --   --  HGB 13.2 12.3* 12.3* 12.1* 11.9*  11.5*  HCT 41.3 39.6 39.4 38.6* 38.6* 36.7*  MCV 90.6 92.1 92.1 91.7 92.8 92.0  PLT 273 283 257 254 237 253   Lipid Panel: Recent Labs    11/03/22 1000 08/19/23 0934  CHOL 109 86  HDL 46 40  LDLCALC 48 33  TRIG 75 46  CHOLHDL 2.4 2.2   TSH: Recent Labs    07/03/23 1730  TSH 1.058   A1C: Lab Results  Component Value Date   HGBA1C 5.7 (H) 08/19/2023     Assessment/Plan  PAF (paroxysmal atrial fibrillation) (HCC) Assessment & Plan: Rate controlled on metoprolol  and xarelto  for anticoagulation    History of DVT (deep vein thrombosis) Assessment & Plan: Continues on xarelto     Type 2 diabetes mellitus with stage 3b chronic kidney disease, without long-term current use of insulin  (HCC) Assessment & Plan: A1c controlled Continue current regimen with dietary modifications   Morbid obesity (HCC) Assessment & Plan: -education provided on healthy weight loss through increase in physical activity and proper nutrition  -continues on mounjaro    OSA (obstructive sleep apnea) Assessment & Plan: Continues on cpap   Hyperlipidemia LDL goal <70 Assessment & Plan: Continues on lipitor with dietary modifications   Chronic respiratory failure with hypoxia (HCC) Assessment & Plan: Ongoing, continues on O2   Heart failure with mildly reduced ejection fraction (HFmrEF) (HCC) Assessment & Plan: Ongoing shortness of breath, also being evaluated by pulmonary but felt that shortness of breath likely coming from heart failure Continues on lasix  60 mg daily with aldactone  25 mg daily with lopressor , hydralazine  and losartan .    Iron  deficiency anemia, unspecified iron  deficiency anemia type Assessment & Plan: Anemia stable, will follow cbc   Coronary artery disease involving native coronary artery of native heart without angina pectoris Assessment & Plan: -without chest pains Minimal non-obstructive disease by cardiac catheterization in May 2019 noted by  cardiology -continue medical management with ASA, statin, beta blocker and imdur   Orders: -     Isosorbide  Mononitrate ER; Take 1 tablet (30 mg total) by mouth daily.  Dispense: 90 tablet; Refill: 3  BPH with obstruction/lower urinary tract symptoms Assessment & Plan: Continues with suprapubic, urinary output has been stable.      Return in about 4 months (around 02/03/2024) for routine follow up.:  Michela Herst K. Caro BODILY [facility] [phone]

## 2023-10-04 NOTE — Assessment & Plan Note (Signed)
 Rate controlled on metoprolol  and xarelto  for anticoagulation

## 2023-10-04 NOTE — Patient Instructions (Addendum)
 Start mucinex  (plain) by mouth twice daily as needed for mucous   Shingles and TDAP vaccine due- can get these at local pharmacy COVID and flu boosters in October

## 2023-10-04 NOTE — Assessment & Plan Note (Signed)
 Continues on xarelto.

## 2023-10-04 NOTE — Assessment & Plan Note (Signed)
-  without chest pains Minimal non-obstructive disease by cardiac catheterization in May 2019 noted by cardiology -continue medical management with ASA, statin, beta blocker and imdur

## 2023-10-04 NOTE — Assessment & Plan Note (Signed)
 A1c controlled Continue current regimen with dietary modifications

## 2023-10-04 NOTE — Assessment & Plan Note (Signed)
 Anemia stable, will follow cbc

## 2023-10-04 NOTE — Assessment & Plan Note (Signed)
 Continues with suprapubic, urinary output has been stable.

## 2023-10-05 ENCOUNTER — Other Ambulatory Visit: Payer: Self-pay

## 2023-10-05 NOTE — Patient Outreach (Unsigned)
 Complex Care Management   Visit Note  10/05/2023  Name:  Thomas [name] MRN: [id number] DOB: 06-03-1950  Situation: Referral received for Complex Care Management related to CHF, Pulmonary Hypertension, OSA w/CPAP, PAF (paroxysmal atrial fibrillation). I obtained verbal consent from Patient.  Visit completed with Patient on the phone.   Background:   Past Medical History:  Diagnosis Date   Acute on chronic diastolic CHF (congestive heart failure) (HCC) 06/01/2017   Arthritis    Cancer (HCC) 2010   Prostate   Chronic kidney disease    Diabetes mellitus    Diabetic neuropathy (HCC)    feet   DVT (deep venous thrombosis) (HCC)    Genetic testing 06/22/2016   Thomas [name] underwent genetic counseling and testing for hereditary cancer syndromes on 05/14/2016. His results were negative for mutations in all 46 genes analyzed by [facility]'s 46-gene Common Hereditary Cancers Panel. Genes analyzed include: APC, ATM, AXIN2, BARD1, BMPR1A, BRCA1, BRCA2, BRIP1, CDH1, CDKN2A, CHEK2, CTNNA1, DICER1, EPCAM, GREM1, HOXB13, KIT, MEN1, MLH1, MSH2, MSH3, MSH6, MUTYH, NB   GERD (gastroesophageal reflux disease)    Hypertension    PE (pulmonary thromboembolism) (HCC)    Pneumonia    Sleep apnea    not wearing CPAP   Suprapubic catheter (HCC) 11/17/2019    Assessment: Patient Reported Symptoms:  Cognitive Cognitive Status: Alert and oriented to person, place, and time, Normal speech and language skills Cognitive/Intellectual Conditions Management [RPT]: None reported or documented in medical history or problem list   Health Maintenance Behaviors: Annual physical exam, Healthy diet Health Facilitated by: Healthy diet, Rest  Neurological Neurological Review of Symptoms: No symptoms reported    HEENT HEENT Symptoms Reported: No symptoms reported      Cardiovascular Cardiovascular Symptoms Reported: No symptoms reported Does patient have uncontrolled Hypertension?: No Cardiovascular Management  Strategies: Adequate rest, Fluid modification, Medication therapy, Routine screening, Weight management Do You Have a Working Readable Scale?: Yes Weight: 298 lb (135.2 kg) Cardiovascular Self-Management Outcome: 4 (good)  Respiratory Respiratory Symptoms Reported: Shortness of breath Respiratory Management Strategies: CPAP, Oxygen  therapy, Medication therapy, Routine screening, Adequate rest Respiratory Self-Management Outcome: 4 (good)  Endocrine Endocrine Symptoms Reported: No symptoms reported Is patient diabetic?: Yes Is patient checking blood sugars at home?: Yes List most recent blood sugar readings, include date and time of day: FBS 113 taken yesterday am Endocrine Self-Management Outcome: 4 (good)  Gastrointestinal Gastrointestinal Symptoms Reported: No symptoms reported      Genitourinary Genitourinary Symptoms Reported: No symptoms reported Genitourinary Management Strategies:  (suprapubic catheter) Genitourinary Self-Management Outcome: 4 (good) Genitourinary Comment: Suprapubic Cath changes every month per [facility]  Integumentary Integumentary Symptoms Reported: No symptoms reported    Musculoskeletal Musculoskelatal Symptoms Reviewed: Difficulty walking, Limited mobility Musculoskeletal Management Strategies: Adequate rest, Medical device, Routine screening Musculoskeletal Self-Management Outcome: 4 (good)      Psychosocial Psychosocial Symptoms Reported: No symptoms reported   Major Change/Loss/Stressor/Fears (CP): Denies Quality of Family Relationships: supportive, involved, helpful Do you feel physically threatened by others?: No    10/05/2023    PHQ2-9 Depression Screening   [name] interest or pleasure in doing things Not at all  Feeling down, depressed, or hopeless Not at all  PHQ-2 - Total Score 0  Trouble falling or staying asleep, or sleeping too much    Feeling tired or having [name] energy    Poor appetite or overeating     Feeling bad about  yourself - or that you are a failure or have let yourself or your  family down    Trouble concentrating on things, such as reading the newspaper or watching television    Moving or speaking so slowly that other people could have noticed.  Or the opposite - being so fidgety or restless that you have been moving around a lot more than usual    Thoughts that you would be better off dead, or hurting yourself in some way    PHQ2-9 Total Score    If you checked off any problems, how difficult have these problems made it for you to do your work, take care of things at home, or get along with other people    Depression Interventions/Treatment      Vitals:   10/05/23 1105  BP: 136/80  Pulse: 76  Temp: (!) 97.4 F (36.3 C)  SpO2: 95%    Medications Reviewed Today     Reviewed by Morgan Clayborne CROME, RN (Registered Nurse) on 10/05/23 at 1117  Med List Status: <None>   Medication Order Taking? Sig Documenting Provider Last Dose Status Informant  Accu-Chek Softclix Lancets lancets [id number]  Use to test blood sugar daily [name], NP  Active Self, Pharmacy Records  Alcohol  Swabs (B-D SINGLE USE SWABS REGULAR) PADS [id number]  Use in testing blood sugar daily. Dx: E11.22 [name], NP  Active Self, Pharmacy Records  aspirin  EC 81 MG tablet [id number]  Take 81 mg by mouth daily. [provider]  Active Self, Pharmacy Records  atorvastatin  (LIPITOR) 40 MG tablet [id number]  TAKE 1 TABLET EVERY DAY [name], Jessica K, NP  Active Self, Pharmacy Records           Med Note [name] [name] CROME   Dju Jul 03, 2023  2:32 PM) LF: 05/13/23 for a 90ds  Blood Glucose Calibration (ACCU-CHEK AVIVA) SOLN [id number]  Use once daily as directed dx E11.22 [name], Jessica K, NP  Active Self, Pharmacy Records  calcium  carbonate (TUMS - DOSED IN MG ELEMENTAL CALCIUM ) 500 MG chewable tablet [id number]  Chew 2 tablets by mouth as needed for indigestion or heartburn. [provider]  Active Self,  Pharmacy Records  furosemide  (LASIX ) 20 MG tablet [id number]  Take 3 tablets(60 mg total) by mouth daily. May take additional 20 mg as needed if weight increase 3lbs overnight or increase 5lbs in a week. Meng, Hao, [place]  Active   glucose blood (ACCU-CHEK AVIVA PLUS) test strip [id number]  Use to test blood sugar daily. [name], NP  Active   hydrALAZINE  (APRESOLINE ) 25 MG tablet [id number]  TAKE 3 TABLETS THREE TIMES DAILY [name], Jessica K, NP  Active   isosorbide  mononitrate (IMDUR ) 30 MG 24 hr tablet [id number]  Take 1 tablet (30 mg total) by mouth daily. [name], NP  Active   Lancets Misc. (ACCU-CHEK SOFTCLIX LANCET DEV) KIT [id number]  Use to test blood sugar daily [name], NP  Active Self, Pharmacy Records  losartan  (COZAAR ) 50 MG tablet [id number]  Take 0.5 tablets (25 mg total) by mouth daily. [name], NP  Active   metFORMIN  (GLUCOPHAGE ) 500 MG tablet [id number]  TAKE 1 TABLET TWICE DAILY WITH MEALS [name], Jessica K, NP  Active Self, Pharmacy Records           Med Note (SATTERFIELD, TEENA BRAVO   Tue Aug 03, 2023  5:38 PM)    metoprolol  tartrate (LOPRESSOR ) 100 MG tablet [id number]  Take 1 tablet (100 mg total) by mouth 2 (two) times daily. Meng, Hao, [place]  Active  Multiple Vitamin (MULTIVITAMIN ADULT PO) [id number]  Take 2 each by mouth daily as needed (immune system). [provider]  Active Self, Pharmacy Records  potassium chloride  SA (KLOR-CON  M) 20 MEQ tablet [id number]  TAKE 1 TABLET TWICE DAILY [name], Jessica K, NP  Active Self, Pharmacy Records           Med Note (SATTERFIELD, TEENA BRAVO   Tue Aug 03, 2023  5:39 PM)    rivaroxaban  (XARELTO ) 20 MG TABS tablet [id number]  Take 1 tablet (20 mg total) by mouth daily with supper. Pokhrel, Laxman, MD  Active            Med Note (SATTERFIELD, TEENA BRAVO   Tue Aug 03, 2023  5:39 PM)    Senna 8.7 MG CHEW [id number]  Chew 1 tablet by mouth as needed. [name], NP  Active Self, Pharmacy Records   spironolactone  (ALDACTONE ) 25 MG tablet [id number]  Take 1 tablet (25 mg total) by mouth daily. [name], NP  Active   tirzepatide  (MOUNJARO ) 15 MG/0.5ML Pen [id number]  Inject 15 mg into the skin once a week. Santo Stanly LABOR, MD  Active            Med Note (SATTERFIELD, TEENA BRAVO   Tue Aug 03, 2023  5:36 PM) Take on sundays            Recommendation:   PCP Follow-up Specialty provider follow-up with Cardiology and Pulmonology as directed  Continue Current Plan of Care  Follow Up Plan:   Telephone follow up appointment date/time:  Wednesday, September 17 at 11:30 AM  Clayborne Ly RN BSN CCM   [facility], [facility] Nurse Care Coordinator  Direct Dial: [phone] Website: [email or web address]

## 2023-10-05 NOTE — Patient Instructions (Signed)
 Visit Information  Thank you for taking time to visit with me today. Please don't hesitate to contact me if I can be of assistance to you before our next scheduled appointment.  Your next care management appointment is by telephone on Wednesday, September 17 at 11:30 AM  Please call the care guide team at [phone] if you need to cancel, schedule, or reschedule an appointment.   Please call [phone]-TALK (toll free, 24 hour hotline) if you are experiencing a Mental Health or Behavioral Health Crisis or need someone to talk to.  Clayborne Ly RN BSN CCM [place]  [facility], [facility] Health Nurse Care Coordinator  Direct Dial: [phone] Website: [email or web address]

## 2023-10-06 ENCOUNTER — Other Ambulatory Visit: Payer: Self-pay | Admitting: Nurse Practitioner

## 2023-10-06 ENCOUNTER — Other Ambulatory Visit (HOSPITAL_COMMUNITY): Payer: Self-pay

## 2023-10-06 ENCOUNTER — Other Ambulatory Visit: Payer: Self-pay

## 2023-10-06 ENCOUNTER — Other Ambulatory Visit: Payer: Self-pay | Admitting: Physician Assistant

## 2023-10-11 DIAGNOSIS — R339 Retention of urine, unspecified: Secondary | ICD-10-CM | POA: Diagnosis not present

## 2023-10-11 DIAGNOSIS — Z466 Encounter for fitting and adjustment of urinary device: Secondary | ICD-10-CM | POA: Diagnosis not present

## 2023-10-12 ENCOUNTER — Other Ambulatory Visit (HOSPITAL_COMMUNITY): Payer: Self-pay

## 2023-10-13 ENCOUNTER — Other Ambulatory Visit (HOSPITAL_COMMUNITY): Payer: Self-pay

## 2023-10-25 ENCOUNTER — Other Ambulatory Visit: Payer: Self-pay

## 2023-10-25 ENCOUNTER — Other Ambulatory Visit (HOSPITAL_COMMUNITY): Payer: Self-pay

## 2023-10-26 ENCOUNTER — Other Ambulatory Visit: Payer: Self-pay

## 2023-10-27 ENCOUNTER — Telehealth: Payer: Self-pay | Admitting: Internal Medicine

## 2023-10-27 ENCOUNTER — Other Ambulatory Visit (HOSPITAL_COMMUNITY): Payer: Self-pay

## 2023-10-27 DIAGNOSIS — I5022 Chronic systolic (congestive) heart failure: Secondary | ICD-10-CM

## 2023-10-27 MED ORDER — SPIRONOLACTONE 25 MG PO TABS
25.0000 mg | ORAL_TABLET | Freq: Every day | ORAL | 3 refills | Status: DC
  Filled 2023-10-27: qty 30, 30d supply, fill #0
  Filled 2023-10-27: qty 90, 90d supply, fill #0
  Filled 2023-11-23: qty 30, 30d supply, fill #1
  Filled 2023-11-23: qty 30, 30d supply, fill #0

## 2023-10-27 NOTE — Telephone Encounter (Signed)
 RX sent in

## 2023-10-27 NOTE — Telephone Encounter (Signed)
 Left message for patient to return the call.

## 2023-10-27 NOTE — Telephone Encounter (Signed)
 Pt c/o medication issue:  1. Name of Medication: spironolactone  (ALDACTONE ) 25 MG tablet   2. How are you currently taking this medication (dosage and times per day)?   Take 1 tablet (25 mg total) by mouth daily.    3. Are you having a reaction (difficulty breathing--STAT)? No  4. What is your medication issue? Pt has misplaced his medication and does not have any until the pharmacy will fill Rx on 9/14. Pt is requesting an emergency supply. Please advise.

## 2023-10-27 NOTE — Telephone Encounter (Signed)
*  STAT* If patient is at the pharmacy, call can be transferred to refill team.   1. Which medications need to be refilled? (please list name of each medication and dose if known)   spironolactone  (ALDACTONE ) 25 MG tablet     2. Would you like to learn more about the convenience, safety, & potential cost savings by using the [facility]? No  3. Are you open to using the [facility] (Type [facility]. No   4. Which pharmacy/location (including street and city if local pharmacy) is medication to be sent to? [facility]    5. Do they need a 30 day or 90 day supply? 90 day   Pt is out of medication

## 2023-10-28 NOTE — Telephone Encounter (Signed)
 Looks like refill was sent in to [facility] yesterday.

## 2023-10-29 NOTE — Telephone Encounter (Signed)
 Left a message to call back.  Would like to make sure pt received medication.

## 2023-11-01 ENCOUNTER — Ambulatory Visit: Payer: Self-pay

## 2023-11-01 NOTE — Telephone Encounter (Signed)
 FYI Only or Action Required?: FYI only for provider.  Patient was last seen in primary care on 10/04/2023 by [name], NP.  Called Nurse Triage reporting Urinary Frequency.  Symptoms began yesterday.  Interventions attempted: Nothing.  Symptoms are: stable.  Triage Disposition: Call PCP Within 24 Hours  Patient/caregiver understands and will follow disposition?: Yes      Copied from CRM #[id number]. Topic: Clinical - Red Word Triage >> Nov 01, 2023 11:58 AM Zane F wrote: [facility] that prompted transfer to Nurse Triage:   Concern: Contents of the catheter bag have turned plastic purple in color   Symptoms: No accompanied symptoms   When did the symptoms start?: Yesterday   What have you done to aid in the concern ? Have you taken anything to assist with the matter?: Yes    If so, what did you take?: Emptied the bag and the urine; Noticed the plastic to the bag had turned purple yet again Reason for Disposition  [1] Taking medicine for urinary symptoms (e.g., incontinence, overactive bladder) AND [2] feels is having side effects (e.g., dry mouth, GI symptoms, difficulty urinating)  Answer Assessment - Initial Assessment Questions 1. SYMPTOM: What's the main symptom you're concerned about? (e.g., frequency, incontinence)     Supra pubic cath, states bag turned purple yesterday 2. ONSET: When did the  symptoms  start?     yesterday 3. PAIN: Is there any pain? If Yes, ask: How bad is it? (Scale: 1-10; mild, moderate, severe)     States gets a little clot 4. CAUSE: What do you think is causing the symptoms?     uti 5. OTHER SYMPTOMS: Do you have any other symptoms? (e.g., blood in urine, fever, flank pain, pain with urination)     denies 6. PREGNANCY: Is there any chance you are pregnant? When was your last menstrual period?     na  Protocols used: Urinary Symptoms-A-AH

## 2023-11-02 ENCOUNTER — Encounter: Payer: Self-pay | Admitting: Family

## 2023-11-02 ENCOUNTER — Other Ambulatory Visit (HOSPITAL_COMMUNITY): Payer: Self-pay

## 2023-11-02 ENCOUNTER — Ambulatory Visit (INDEPENDENT_AMBULATORY_CARE_PROVIDER_SITE_OTHER): Admitting: Family

## 2023-11-02 VITALS — BP 136/80 | HR 76 | Temp 97.6°F | Resp 19 | Ht 69.0 in | Wt 297.0 lb

## 2023-11-02 DIAGNOSIS — R109 Unspecified abdominal pain: Secondary | ICD-10-CM

## 2023-11-02 DIAGNOSIS — Z23 Encounter for immunization: Secondary | ICD-10-CM | POA: Diagnosis not present

## 2023-11-02 DIAGNOSIS — R300 Dysuria: Secondary | ICD-10-CM | POA: Diagnosis not present

## 2023-11-02 DIAGNOSIS — R8290 Unspecified abnormal findings in urine: Secondary | ICD-10-CM | POA: Diagnosis not present

## 2023-11-02 LAB — POCT URINALYSIS DIPSTICK
Bilirubin, UA: NEGATIVE
Glucose, UA: NEGATIVE
Ketones, UA: POSITIVE — AB
Nitrite, UA: POSITIVE
Protein, UA: POSITIVE — AB
Spec Grav, UA: <=1.005 — AB (ref 1.010–1.025)
Urobilinogen, UA: NEGATIVE U/dL — AB
pH, UA: 8.5 — AB (ref 5.0–8.0)

## 2023-11-02 MED ORDER — CIPROFLOXACIN HCL 500 MG PO TABS
500.0000 mg | ORAL_TABLET | Freq: Two times a day (BID) | ORAL | 0 refills | Status: AC
  Filled 2023-11-02: qty 14, 7d supply, fill #0

## 2023-11-02 NOTE — Progress Notes (Signed)
 Provider: Aodhan Scheidt FNP-C  [name], NP  Patient Care Team: [name], NP as PCP - General (Geriatric Medicine) Mealor, Eulas BRAVO, MD as PCP - Electrophysiology (Cardiology) Santo Stanly LABOR, MD as PCP - Cardiology (Cardiology) Morgan, Clayborne CROME, RN as [facility] Management Lelon Glendia ONEIDA DEVONNA as Physician Assistant (Cardiology) Marcey Elspeth PARAS, MD as Consulting Physician (Ophthalmology)  Extended Emergency Contact Information Primary Emergency Contact: Aldridge,Barbara Address: [address]          [place], [place] [address] [place] Home Phone: [phone] Mobile Phone: [phone] Relation: Spouse Secondary Emergency Contact: Twist,Kabirah A Address: [address] [place], [place] [address] [place] Home Phone: [phone] Relation: Daughter  Code Status:  Full Code  Goals of care: Advanced Directive information    11/02/2023    8:56 AM  Advanced Directives  Does Patient Have a Medical Advance Directive? No  Would patient like information on creating a medical advance directive? No - Patient declined     Chief Complaint  Patient presents with   Urinary Tract Infection   Discussed the use of AI scribe software for clinical note transcription with the patient, who gave verbal consent to proceed.  History of Present Illness   Thomas [name] is a 73 year old male with chronic kidney disease who presents with concerns of purple urine bag syndrome and possible UTI.  He noticed a change in the color of his urine bag, which appeared purple after emptying it. He is concerned it might indicate a urinary tract infection (UTI) due to his history of UTIs that have been difficult to resolve.  He experiences occasional stomach pain, particularly in the morning, but has no fever, chills, nausea, vomiting, or back pain.  He recently began taking mullein, an herbal medicine for respiratory health,  in liquid form. He has a suprapubic catheter and changed the catheter bag on Sunday, noting occasional drainage around the catheter site but no pus or significant issues.  His current medications include a blood thinner, and he has a known allergy to lisinopril . He was previously on Jardiance  but has discontinued it. His blood sugar was 102 mg/dL this morning, which he monitors regularly. He has taken Cipro  in the past for infections, dosed at 500 mg twice daily.   Past Medical History:  Diagnosis Date   Acute on chronic diastolic CHF (congestive heart failure) (HCC) 06/01/2017   Arthritis    Cancer (HCC) 2010   Prostate   Chronic kidney disease    Diabetes mellitus    Diabetic neuropathy (HCC)    feet   DVT (deep venous thrombosis) (HCC)    Genetic testing 06/22/2016   Mr. Fjelstad underwent genetic counseling and testing for hereditary cancer syndromes on 05/14/2016. His results were negative for mutations in all 46 genes analyzed by [facility]'s 46-gene Common Hereditary Cancers Panel. Genes analyzed include: APC, ATM, AXIN2, BARD1, BMPR1A, BRCA1, BRCA2, BRIP1, CDH1, CDKN2A, CHEK2, CTNNA1, DICER1, EPCAM, GREM1, HOXB13, KIT, MEN1, MLH1, MSH2, MSH3, MSH6, MUTYH, NB   GERD (gastroesophageal reflux disease)    Hypertension    PE (pulmonary thromboembolism) (HCC)    Pneumonia    Sleep apnea    not wearing CPAP   Suprapubic catheter (HCC) 11/17/2019   Past Surgical History:  Procedure Laterality Date   CARDIOVERSION N/A 06/03/2017   Procedure: CARDIOVERSION;  Surgeon: Jeffrie Oneil BROCKS, MD;  Location: [facility] END[facility];  Service: Cardiovascular;  Laterality: N/A;   CARDIOVERSION N/A 07/27/2023   Procedure: CARDIOVERSION;  Surgeon: Delford Maude BROCKS, MD;  Location: [facility] INVASIVE CV LAB;  Service: Cardiovascular;  Laterality: N/A;   CARDIOVERSION N/A 08/16/2023   Procedure: CARDIOVERSION;  Surgeon: Santo Stanly LABOR, MD;  Location: [facility] INVASIVE CV LAB;  Service: Cardiovascular;  Laterality: N/A;    CATARACT EXTRACTION Left 07/31/2021   Dr.Glenn, [facility]   COLONOSCOPY     COLONOSCOPY WITH PROPOFOL  N/A 10/20/2018   Procedure: COLONOSCOPY WITH PROPOFOL ;  Surgeon: Eda Iha, MD;  Location: [facility];  Service: Gastroenterology;  Laterality: N/A;   HERNIA REPAIR     KNEE SURGERY     MULTIPLE TOOTH EXTRACTIONS     POLYPECTOMY  10/20/2018   Procedure: POLYPECTOMY;  Surgeon: Eda Iha, MD;  Location: [facility];  Service: Gastroenterology;;   PROSTATE SURGERY     RADIOACTIVE SEED IMPLANT     RIGHT/LEFT HEART CATH AND CORONARY ANGIOGRAPHY N/A 07/06/2017   Procedure: RIGHT/LEFT HEART CATH AND CORONARY ANGIOGRAPHY;  Surgeon: Dann Candyce RAMAN, MD;  Location: [facility] INVASIVE CV LAB;  Service: Cardiovascular;  Laterality: N/A;   SHOULDER SURGERY     TOTAL KNEE ARTHROPLASTY Right 11/19/2016   Procedure: RIGHT TOTAL KNEE ARTHROPLASTY;  Surgeon: Jerri Kay HERO, MD;  Location: [facility] OR;  Service: Orthopedics;  Laterality: Right;   uretha surgery-2014      Allergies  Allergen Reactions   Lisinopril  Cough and Other (See Comments)    Muscle pain    Outpatient Encounter Medications as of 11/02/2023  Medication Sig   Accu-Chek Softclix Lancets lancets Use to test blood sugar daily   Alcohol  Swabs (B-D SINGLE USE SWABS REGULAR) PADS Use in testing blood sugar daily. Dx: E11.22   aspirin  EC 81 MG tablet Take 81 mg by mouth daily.   atorvastatin  (LIPITOR) 40 MG tablet TAKE 1 TABLET EVERY DAY   Blood Glucose Calibration (ACCU-CHEK AVIVA) SOLN Use once daily as directed dx E11.22   calcium  carbonate (TUMS - DOSED IN MG ELEMENTAL CALCIUM ) 500 MG chewable tablet Chew 2 tablets by mouth as needed for indigestion or heartburn.   ciprofloxacin  (CIPRO ) 500 MG tablet Take 1 tablet (500 mg total) by mouth 2 (two) times daily for 7 days.   furosemide  (LASIX ) 20 MG tablet TAKE 1 TABLET AS DIRECTED AS NEEDED   glucose blood (ACCU-CHEK AVIVA PLUS) test strip Use to test blood sugar  daily.   hydrALAZINE  (APRESOLINE ) 25 MG tablet TAKE 3 TABLETS THREE TIMES DAILY   isosorbide  mononitrate (IMDUR ) 30 MG 24 hr tablet Take 1 tablet (30 mg total) by mouth daily.   Lancets Misc. (ACCU-CHEK SOFTCLIX LANCET DEV) KIT Use to test blood sugar daily   losartan  (COZAAR ) 50 MG tablet Take 0.5 tablets (25 mg total) by mouth daily.   metFORMIN  (GLUCOPHAGE ) 500 MG tablet TAKE 1 TABLET TWICE DAILY WITH MEALS   metoprolol  tartrate (LOPRESSOR ) 100 MG tablet Take 1 tablet (100 mg total) by mouth 2 (two) times daily.   Multiple Vitamin (MULTIVITAMIN ADULT PO) Take 2 each by mouth daily as needed (immune system).   potassium chloride  SA (KLOR-CON  M) 20 MEQ tablet TAKE 1 TABLET TWICE DAILY   rivaroxaban  (XARELTO ) 20 MG TABS tablet Take 1 tablet (20 mg total) by mouth daily with supper.   Senna 8.7 MG CHEW Chew 1 tablet by mouth as needed.   spironolactone  (ALDACTONE ) 25 MG tablet Take 1 tablet (25 mg total) by mouth daily.   tirzepatide  (MOUNJARO ) 15 MG/0.5ML Pen Inject  15 mg into the skin once a week.   No facility-administered encounter medications on file as of 11/02/2023.    Review of Systems  Constitutional:  Negative for appetite change, chills, fatigue, fever and unexpected weight change.  Eyes:  Negative for pain, discharge, redness, itching and visual disturbance.  Respiratory:  Negative for cough, chest tightness, shortness of breath and wheezing.   Cardiovascular:  Negative for chest pain, palpitations and leg swelling.  Gastrointestinal:  Positive for abdominal pain. Negative for abdominal distention, blood in stool, constipation, diarrhea, nausea and vomiting.  Genitourinary:  Positive for dysuria. Negative for difficulty urinating, flank pain, frequency and urgency.       Suprapubic catheter   Musculoskeletal:  Positive for gait problem. Negative for joint swelling and myalgias.  Skin:  Negative for color change, pallor and rash.  Neurological:  Negative for dizziness, syncope,  speech difficulty, weakness, light-headedness, numbness and headaches.  Psychiatric/Behavioral:  Negative for agitation, behavioral problems, confusion, hallucinations and sleep disturbance. The patient is not nervous/anxious.     Immunization History  Administered Date(s) Administered   Fluad Quad(high Dose 65+) 12/09/2018, 03/15/2020, 01/06/2021   Fluad Trivalent(High Dose 65+) 11/06/2022   INFLUENZA, HIGH DOSE SEASONAL PF 12/14/2016, 11/02/2023   Influenza,inj,Quad PF,6+ Mos 11/22/2013, 12/05/2014, 10/24/2015   Influenza-Unspecified 11/29/2017   PFIZER(Purple Top)SARS-COV-2 Vaccination 07/29/2019, 08/08/2019, 08/19/2019, 01/25/2020   Pneumococcal Conjugate-13 05/23/2014   Pneumococcal Polysaccharide-23 12/13/2012, 05/21/2016   Pertinent  Health Maintenance Due  Topic Date Due   HEMOGLOBIN A1C  02/19/2024   FOOT EXAM  05/23/2024   OPHTHALMOLOGY EXAM  05/30/2024   Colonoscopy  10/19/2028   Influenza Vaccine  Completed      05/27/2023    2:46 PM 08/02/2023   10:33 AM 08/17/2023    3:06 PM 08/31/2023    2:47 PM 11/02/2023    8:56 AM  Fall Risk  Falls in the past year? 0 0  0 0  Was there an injury with Fall? 0 0   0  Fall Risk Category Calculator 0 0   0  Patient at Risk for Falls Due to No Fall Risks Impaired balance/gait;Impaired mobility Impaired balance/gait;Other (Comment)  No Fall Risks  Patient at Risk for Falls Due to - Comments   oxygen  tubing, reports being lightheaded with exertion/shortness of breath at times.    Fall risk Follow up Falls prevention discussed Falls evaluation completed Falls prevention discussed  Falls evaluation completed   Functional Status Survey:    Vitals:   11/02/23 0858  BP: 136/80  Pulse: 76  Resp: 19  Temp: 97.6 F (36.4 C)  SpO2: 95%  Weight: 297 lb (134.7 kg)  Height: 5' 9 (1.753 m)   Body mass index is 43.86 kg/m. Physical Exam  VITALS: T- 97.6, P- 76, BP- 136/80, SaO2- 95% MEASUREMENTS: Weight- 297. GENERAL: Alert,  cooperative, well developed, no acute distress. HEENT: Normocephalic, normal oropharynx, moist mucous membranes. CHEST: Clear to auscultation bilaterally, no wheezes, rhonchi, or crackles. CARDIOVASCULAR: Normal heart rate and rhythm, S1 and S2 normal without murmurs. ABDOMEN: Soft, non-tender, non-distended, without organomegaly, normal bowel sounds. No costovertebral angle tenderness. GENITOURINARY: Suprapubic catheter site with dressing, no pus or significant drainage. EXTREMITIES: No cyanosis or edema. NEUROLOGICAL: Cranial nerves grossly intact, moves all extremities without gross motor or sensory deficit.   Labs reviewed: Recent Labs    07/05/23 0546 07/06/23 0955 07/15/23 1206 08/02/23 1109 08/06/23 1118 08/07/23 1055 08/19/23 0934 09/10/23 1059  NA 139   < > 145   < >  --  144 143 143  K 4.1   < > 4.5   < >  --  3.9 4.0 4.3  CL 109   < > 109   < >  --  108 108 105  CO2 23   < > 26   < >  --  24 26 19*  GLUCOSE 130*   < > 111*   < >  --  168* 125* 159*  BUN 26*   < > 27*   < >  --  29* 36* 26  CREATININE 1.65*   < > 1.77*   < >  --  1.56* 1.63* 1.53*  CALCIUM  8.3*   < > 8.8   < >  --  8.2* 9.1 9.0  MG 2.2  --  2.0  --  2.2  --   --   --    < > = values in this interval not displayed.   Recent Labs    11/03/22 1000 05/24/23 1034 08/02/23 1109 08/03/23 1238  AST 21 12 15 25   ALT 36 14 34 38  ALKPHOS 94  --   --  56  BILITOT 0.6 0.6 1.0 1.7*  PROT 7.3 7.4 6.8 7.3  ALBUMIN 4.2  --   --  3.7   Recent Labs    07/15/23 1206 08/02/23 1109 08/03/23 1238 08/04/23 0137 08/05/23 0918 08/06/23 0307  WBC 9.9 9.1 8.8 8.6 7.4 8.3  NEUTROABS 7,999* 7,680 7.3  --   --   --   HGB 13.2 12.3* 12.3* 12.1* 11.9* 11.5*  HCT 41.3 39.6 39.4 38.6* 38.6* 36.7*  MCV 90.6 92.1 92.1 91.7 92.8 92.0  PLT 273 283 257 254 237 253   Lab Results  Component Value Date   TSH 1.058 07/03/2023   Lab Results  Component Value Date   HGBA1C 5.7 (H) 08/19/2023   Lab Results  Component  Value Date   CHOL 86 08/19/2023   HDL 40 08/19/2023   LDLCALC 33 08/19/2023   TRIG 46 08/19/2023   CHOLHDL 2.2 08/19/2023    Significant Diagnostic Results in last 30 days:  No results found.  Assessment/Plan  Urinary tract infection with indwelling catheter Presence of purple urine bag syndrome and urinalysis indicating bacterial infection with blood, protein, white blood cells, and nitrites. No fever, chills, or back pain. Intermittent abdominal pain possibly related to catheter use. Risk of infection due to indwelling catheter. - Send urine for culture to identify specific bacteria and antibiotic sensitivity. - Start ciprofloxacin  500 mg, one tablet in the morning and one in the evening. - Advise taking a probiotic to prevent antibiotic-associated diarrhea. - Encourage increased water intake to flush kidneys and prevent infection. - Instruct to monitor for fever or worsening symptoms and report if he occurs.  Chronic kidney disease stage 3 Confirmed by recent lab work. No acute changes or specific symptoms related to CKD.   Family/ staff Communication: Reviewed plan of care with patient verbalized understanding   Labs/tests ordered:  - Urine Culture; Future - POC Urinalysis Dipstick   Next Appointment: Return if symptoms worsen or fail to improve.   Total time: 20 minutes. Greater than 50% of total time spent doing patient education regarding UTI ,CKD stage 3,health maintenance including symptom/medication management.   Roxan JAYSON Plough, NP

## 2023-11-02 NOTE — Patient Instructions (Signed)
 1.Go to local pharmacy to receive Tdap Vaccine.

## 2023-11-03 ENCOUNTER — Other Ambulatory Visit: Payer: Self-pay

## 2023-11-03 LAB — URINE CULTURE
MICRO NUMBER:: 16974630
SPECIMEN QUALITY:: ADEQUATE

## 2023-11-03 NOTE — Patient Outreach (Signed)
 Complex Care Management   Visit Note  11/03/2023  Name:  Thomas [name] MRN: [id number] DOB: [birth year]  Situation: Referral received for Complex Care Management related to CHF, Pulmonary Hypertension, OSA w/CPAP, PAF (paroxysmal atrial fibrillation). I obtained verbal consent from Patient.  Visit completed with Patient on the phone.  Background:   Past Medical History:  Diagnosis Date   Acute on chronic diastolic CHF (congestive heart failure) (HCC) 06/01/2017   Arthritis    Cancer (HCC) 2010   Prostate   Chronic kidney disease    Diabetes mellitus    Diabetic neuropathy (HCC)    feet   DVT (deep venous thrombosis) (HCC)    Genetic testing 06/22/2016   Mr. Docter underwent genetic counseling and testing for hereditary cancer syndromes on 05/14/2016. His results were negative for mutations in all 46 genes analyzed by [facility]'s 46-gene Common Hereditary Cancers Panel. Genes analyzed include: APC, ATM, AXIN2, BARD1, BMPR1A, BRCA1, BRCA2, BRIP1, CDH1, CDKN2A, CHEK2, CTNNA1, DICER1, EPCAM, GREM1, HOXB13, KIT, MEN1, MLH1, MSH2, MSH3, MSH6, MUTYH, NB   GERD (gastroesophageal reflux disease)    Hypertension    PE (pulmonary thromboembolism) (HCC)    Pneumonia    Sleep apnea    not wearing CPAP   Suprapubic catheter (HCC) 11/17/2019    Assessment: Patient Reported Symptoms:  Cognitive Cognitive Status: Alert and oriented to person, place, and time, Normal speech and language skills Cognitive/Intellectual Conditions Management [RPT]: None reported or documented in medical history or problem list   Health Maintenance Behaviors: Annual physical exam, Healthy diet Health Facilitated by: Healthy diet, Rest  Neurological Neurological Review of Symptoms: No symptoms reported    HEENT HEENT Symptoms Reported: No symptoms reported      Cardiovascular Cardiovascular Symptoms Reported: No symptoms reported Does patient have uncontrolled Hypertension?: No Cardiovascular Management  Strategies: Medication therapy, Adequate rest, Routine screening Do You Have a Working Readable Scale?: Yes Weight: 298 lb (135.2 kg) Cardiovascular Self-Management Outcome: 4 (good)  Respiratory Respiratory Symptoms Reported: Shortness of breath Respiratory Management Strategies: CPAP, Adequate rest, Oxygen  therapy, Routine screening, Medication therapy Respiratory Self-Management Outcome: 4 (good)  Endocrine Endocrine Symptoms Reported: No symptoms reported Is patient diabetic?: Yes Endocrine Self-Management Outcome: 4 (good)  Gastrointestinal Gastrointestinal Symptoms Reported: No symptoms reported      Genitourinary Genitourinary Symptoms Reported: Other Other Genitourinary Symptoms: Urinary Retention; Purple Bag Syndrome; UTI Genitourinary Management Strategies: Medication therapy, Fluid modification, Catheter, indwelling Genitourinary Self-Management Outcome: 4 (good)  Integumentary Integumentary Symptoms Reported: No symptoms reported    Musculoskeletal Musculoskelatal Symptoms Reviewed: Difficulty walking, Limited mobility Musculoskeletal Management Strategies: Adequate rest, Medical device, Routine screening Musculoskeletal Self-Management Outcome: 4 (good)      Psychosocial Psychosocial Symptoms Reported: No symptoms reported   Major Change/Loss/Stressor/Fears (CP): Medical condition, self Techniques to Cope with Loss/Stress/Change: Diversional activities (support system) Quality of Family Relationships: helpful, involved, supportive Do you feel physically threatened by others?: No    11/03/2023    PHQ2-9 Depression Screening   Noell Lorensen interest or pleasure in doing things    Feeling down, depressed, or hopeless    PHQ-2 - Total Score    Trouble falling or staying asleep, or sleeping too much    Feeling tired or having Rosezetta Balderston energy    Poor appetite or overeating     Feeling bad about yourself - or that you are a failure or have let yourself or your family down     Trouble concentrating on things, such as reading the newspaper or watching television  Moving or speaking so slowly that other people could have noticed.  Or the opposite - being so fidgety or restless that you have been moving around a lot more than usual    Thoughts that you would be better off dead, or hurting yourself in some way    PHQ2-9 Total Score    If you checked off any problems, how difficult have these problems made it for you to do your work, take care of things at home, or get along with other people    Depression Interventions/Treatment      There were no vitals filed for this visit.  Medications Reviewed Today     Reviewed by Morgan Clayborne CROME, RN (Registered Nurse) on 11/03/23 at 1135  Med List Status: <None>   Medication Order Taking? Sig Documenting Provider Last Dose Status Informant  Accu-Chek Softclix Lancets lancets [id number]  Use to test blood sugar daily [name], NP  Active Self, Pharmacy Records  Alcohol  Swabs (B-D SINGLE USE SWABS REGULAR) PADS [id number]  Use in testing blood sugar daily. Dx: E11.22 [name], NP  Active Self, Pharmacy Records  aspirin  EC 81 MG tablet [id number]  Take 81 mg by mouth daily. [provider]  Active Self, Pharmacy Records  atorvastatin  (LIPITOR) 40 MG tablet [id number]  TAKE 1 TABLET EVERY DAY [name], Jessica K, NP  Active   Blood Glucose Calibration (ACCU-CHEK AVIVA) SOLN [id number]  Use once daily as directed dx E11.22 [name], Jessica K, NP  Active Self, Pharmacy Records  calcium  carbonate (TUMS - DOSED IN MG ELEMENTAL CALCIUM ) 500 MG chewable tablet [id number]  Chew 2 tablets by mouth as needed for indigestion or heartburn. [provider]  Active Self, Pharmacy Records  ciprofloxacin  (CIPRO ) 500 MG tablet [id number]  Take 1 tablet (500 mg total) by mouth 2 (two) times daily for 7 days. Ngetich, Dinah C, NP  Active   furosemide  (LASIX ) 20 MG tablet [id number]  TAKE 1 TABLET AS DIRECTED AS NEEDED  Lelon, Scott T, PA-C  Active   glucose blood (ACCU-CHEK AVIVA PLUS) test strip [id number]  Use to test blood sugar daily. [name], NP  Active   hydrALAZINE  (APRESOLINE ) 25 MG tablet [id number]  TAKE 3 TABLETS THREE TIMES DAILY [name], Jessica K, NP  Active   isosorbide  mononitrate (IMDUR ) 30 MG 24 hr tablet [id number]  Take 1 tablet (30 mg total) by mouth daily. [name], NP  Active   Lancets Misc. (ACCU-CHEK SOFTCLIX LANCET DEV) KIT [id number]  Use to test blood sugar daily [name], NP  Active Self, Pharmacy Records  losartan  (COZAAR ) 50 MG tablet [id number]  Take 0.5 tablets (25 mg total) by mouth daily. [name], NP  Active   metFORMIN  (GLUCOPHAGE ) 500 MG tablet [id number]  TAKE 1 TABLET TWICE DAILY WITH MEALS [name], Jessica K, NP  Active Self, Pharmacy Records           Med Note (SATTERFIELD, TEENA BRAVO   Tue Aug 03, 2023  5:38 PM)    metoprolol  tartrate (LOPRESSOR ) 100 MG tablet [id number]  Take 1 tablet (100 mg total) by mouth 2 (two) times daily. Meng, Hao, [place]  Active   Multiple Vitamin (MULTIVITAMIN ADULT PO) [id number]  Take 2 each by mouth daily as needed (immune system). [provider]  Active Self, Pharmacy Records  potassium chloride  SA (KLOR-CON  M) 20 MEQ tablet [id number]  TAKE 1 TABLET TWICE DAILY Caro Jessica K, NP  Active Self, Pharmacy Records  Med Note (SATTERFIELD, DARIUS E   Tue Aug 03, 2023  5:39 PM)    rivaroxaban  (XARELTO ) 20 MG TABS tablet [id number]  Take 1 tablet (20 mg total) by mouth daily with supper. Pokhrel, Laxman, MD  Active            Med Note (SATTERFIELD, TEENA BRAVO   Tue Aug 03, 2023  5:39 PM)    Senna 8.7 MG CHEW [id number]  Chew 1 tablet by mouth as needed. [name], NP  Active Self, Pharmacy Records  spironolactone  (ALDACTONE ) 25 MG tablet [id number]  Take 1 tablet (25 mg total) by mouth daily. Santo Stanly LABOR, MD  Active   tirzepatide  (MOUNJARO ) 15 MG/0.5ML Pen [id number]  Inject 15 mg  into the skin once a week. Santo Stanly LABOR, MD  Active            Med Note (SATTERFIELD, TEENA BRAVO   Tue Aug 03, 2023  5:36 PM) Take on sundays            Recommendation:   PCP Follow-up via video visit for your AWV with Harlene Caro NP on 11/16/23 at 09:40 AM Specialty provider follow-up with Cardiology provider on 11/25/23 at 09:00 AM Santo Stanly LABOR, MD Department: [facility]  Specialty provider follow-up with Pulmonology on 11/30/23 at 10:00 AM for PFT/11/30/23 at 11:00 AM with Dr. Meade PUSH PFT ROOM Department: [facility] CARE    Meade Verdon RAMAN, MD Department: [facility] CARE   Continue Current Plan of Care  Follow Up Plan:   Telephone follow up appointment date/time:  Thursday, October 16 at 10:30 AM  Clayborne Ly RN BSN CCM [facility]  [facility], [facility] Health Nurse Care Coordinator  Direct Dial: [phone] Website: [email or web address]

## 2023-11-03 NOTE — Patient Instructions (Signed)
 Visit Information  Thank you for taking time to visit with me today. Please don't hesitate to contact me if I can be of assistance to you before our next scheduled appointment.  Your next care management appointment is by telephone on Thursday, October 16 at 10:30 AM  Please call the care guide team at [phone] if you need to cancel, schedule, or reschedule an appointment.   Please call [phone]-TALK (toll free, 24 hour hotline) if you are experiencing a Mental Health or Behavioral Health Crisis or need someone to talk to.  Clayborne Ly RN BSN CCM [place]  [facility], [facility] Health Nurse Care Coordinator  Direct Dial: [phone] Website: [email or web address]

## 2023-11-04 ENCOUNTER — Ambulatory Visit: Payer: Self-pay | Admitting: Family

## 2023-11-05 ENCOUNTER — Other Ambulatory Visit: Payer: Self-pay | Admitting: Nurse Practitioner

## 2023-11-05 DIAGNOSIS — I5022 Chronic systolic (congestive) heart failure: Secondary | ICD-10-CM

## 2023-11-10 ENCOUNTER — Other Ambulatory Visit: Payer: Self-pay

## 2023-11-10 ENCOUNTER — Other Ambulatory Visit (HOSPITAL_COMMUNITY): Payer: Self-pay

## 2023-11-12 DIAGNOSIS — R339 Retention of urine, unspecified: Secondary | ICD-10-CM | POA: Diagnosis not present

## 2023-11-15 DIAGNOSIS — Z466 Encounter for fitting and adjustment of urinary device: Secondary | ICD-10-CM | POA: Diagnosis not present

## 2023-11-15 DIAGNOSIS — R339 Retention of urine, unspecified: Secondary | ICD-10-CM | POA: Diagnosis not present

## 2023-11-16 ENCOUNTER — Ambulatory Visit (INDEPENDENT_AMBULATORY_CARE_PROVIDER_SITE_OTHER): Admitting: Nurse Practitioner

## 2023-11-16 ENCOUNTER — Encounter: Payer: Self-pay | Admitting: Nurse Practitioner

## 2023-11-16 DIAGNOSIS — Z0000 Encounter for general adult medical examination without abnormal findings: Secondary | ICD-10-CM

## 2023-11-16 NOTE — Progress Notes (Signed)
 This service is provided via telemedicine  No vital signs collected/recorded due to the encounter was a telemedicine visit.   Location of patient (ex: home, work):  Home  Patient consents to a telephone visit:  Yes  Location of the provider (ex: office, home):  Office [facility].   Name of any referring provider:  na  Names of all persons participating in the telemedicine service and their role in the encounter:  Thomas [name], Patient, Donzell Beal, CMA, [name], NP  Time spent on call:  7:10   Subjective:   Thomas [name] is a 73 y.o. male who presents for Medicare Annual/Subsequent preventive examination.  Visit Complete: Virtual I connected with  Thomas MALVA [name] on 11/16/23 by a video and audio enabled telemedicine application and verified that I am speaking with the correct person using two identifiers.  Patient Location: Home  Provider Location: Office/Clinic  I discussed the limitations of evaluation and management by telemedicine. The patient expressed understanding and agreed to proceed.  Vital Signs: Because this visit was a virtual/telehealth visit, some criteria may be missing or patient reported. Any vitals not documented were not able to be obtained and vitals that have been documented are patient reported.  Cardiac Risk Factors include: advanced age (>21men, >24 women);diabetes mellitus;dyslipidemia;hypertension;sedentary lifestyle;male gender;obesity (BMI >30kg/m2)     Objective:    There were no vitals filed for this visit. There is no height or weight on file to calculate BMI.     11/16/2023    9:31 AM 11/02/2023    8:56 AM 08/19/2023    8:33 AM 08/16/2023   10:11 AM 08/04/2023    5:58 AM 08/03/2023   11:18 AM 07/27/2023    7:17 AM  Advanced Directives  Does Patient Have a Medical Advance Directive? No No  No  No No  Would patient like information on creating a medical advance directive? No - Patient declined No - Patient declined No -  Patient declined  No - Patient declined      Current Medications (verified) Outpatient Encounter Medications as of 11/16/2023  Medication Sig   Accu-Chek Softclix Lancets lancets Use to test blood sugar daily   Alcohol  Swabs (B-D SINGLE USE SWABS REGULAR) PADS Use in testing blood sugar daily. Dx: E11.22   aspirin  EC 81 MG tablet Take 81 mg by mouth daily.   atorvastatin  (LIPITOR) 40 MG tablet TAKE 1 TABLET EVERY DAY   Blood Glucose Calibration (ACCU-CHEK AVIVA) SOLN Use once daily as directed dx E11.22   calcium  carbonate (TUMS - DOSED IN MG ELEMENTAL CALCIUM ) 500 MG chewable tablet Chew 2 tablets by mouth as needed for indigestion or heartburn.   furosemide  (LASIX ) 20 MG tablet TAKE 1 TABLET AS DIRECTED AS NEEDED   glucose blood (ACCU-CHEK AVIVA PLUS) test strip Use to test blood sugar daily.   hydrALAZINE  (APRESOLINE ) 25 MG tablet TAKE 3 TABLETS THREE TIMES DAILY   isosorbide  mononitrate (IMDUR ) 30 MG 24 hr tablet Take 1 tablet (30 mg total) by mouth daily.   Lancets Misc. (ACCU-CHEK SOFTCLIX LANCET DEV) KIT Use to test blood sugar daily   losartan  (COZAAR ) 50 MG tablet Take 0.5 tablets (25 mg total) by mouth daily.   metFORMIN  (GLUCOPHAGE ) 500 MG tablet TAKE 1 TABLET TWICE DAILY WITH MEALS   metoprolol  tartrate (LOPRESSOR ) 100 MG tablet Take 1 tablet (100 mg total) by mouth 2 (two) times daily.   Multiple Vitamin (MULTIVITAMIN ADULT PO) Take 2 each by mouth daily as needed (immune system).  potassium chloride  SA (KLOR-CON  M) 20 MEQ tablet TAKE 1 TABLET TWICE DAILY   rivaroxaban  (XARELTO ) 20 MG TABS tablet Take 1 tablet (20 mg total) by mouth daily with supper.   Senna 8.7 MG CHEW Chew 1 tablet by mouth as needed.   spironolactone  (ALDACTONE ) 25 MG tablet Take 1 tablet (25 mg total) by mouth daily.   tirzepatide  (MOUNJARO ) 15 MG/0.5ML Pen Inject 15 mg into the skin once a week.   No facility-administered encounter medications on file as of 11/16/2023.    Allergies  (verified) Lisinopril    History: Past Medical History:  Diagnosis Date   Acute on chronic diastolic CHF (congestive heart failure) (HCC) 06/01/2017   Arthritis    Cancer (HCC) 2010   Prostate   Chronic kidney disease    Diabetes mellitus    Diabetic neuropathy (HCC)    feet   DVT (deep venous thrombosis) (HCC)    Genetic testing 06/22/2016   Mr. Dubeau underwent genetic counseling and testing for hereditary cancer syndromes on 05/14/2016. His results were negative for mutations in all 46 genes analyzed by [facility]'s 46-gene Common Hereditary Cancers Panel. Genes analyzed include: APC, ATM, AXIN2, BARD1, BMPR1A, BRCA1, BRCA2, BRIP1, CDH1, CDKN2A, CHEK2, CTNNA1, DICER1, EPCAM, GREM1, HOXB13, KIT, MEN1, MLH1, MSH2, MSH3, MSH6, MUTYH, NB   GERD (gastroesophageal reflux disease)    Hypertension    PE (pulmonary thromboembolism) (HCC)    Pneumonia    Sleep apnea    not wearing CPAP   Suprapubic catheter (HCC) 11/17/2019   Past Surgical History:  Procedure Laterality Date   CARDIOVERSION N/A 06/03/2017   Procedure: CARDIOVERSION;  Surgeon: Jeffrie Oneil BROCKS, MD;  Location: [facility];  Service: Cardiovascular;  Laterality: N/A;   CARDIOVERSION N/A 07/27/2023   Procedure: CARDIOVERSION;  Surgeon: Delford Maude BROCKS, MD;  Location: [facility] INVASIVE CV LAB;  Service: Cardiovascular;  Laterality: N/A;   CARDIOVERSION N/A 08/16/2023   Procedure: CARDIOVERSION;  Surgeon: Santo Stanly LABOR, MD;  Location: [facility] INVAS[facility] CV LAB;  Service: Cardiovascular;  Laterality: N/A;   CATARACT EXTRACTION Left 07/31/2021   Dr.Glenn, [facility]   COLONOSCOPY     COLONOSCOPY WITH PROPOFOL  N/A 10/20/2018   Procedure: COLONOSCOPY WITH PROPOFOL ;  Surgeon: Eda Iha, MD;  Location: [facility];  Service: Gastroenterology;  Laterality: N/A;   HERNIA REPAIR     KNEE SURGERY     MULTIPLE TOOTH EXTRACTIONS     POLYPECTOMY  10/20/2018   Procedure: POLYPECTOMY;  Surgeon: Eda Iha, MD;   Location: [facility];  Service: Gastroenterology;;   PROSTATE SURGERY     RADIOACTIVE SEED IMPLANT     RIGHT/LEFT HEART CATH AND CORONARY ANGIOGRAPHY N/A 07/06/2017   Procedure: RIGHT/LEFT HEART CATH AND CORONARY ANGIOGRAPHY;  Surgeon: Dann Candyce RAMAN, MD;  [facility] INV[facility] CV [facility];  Service: Cardiovascular;  Laterality: N/A;   SHOULDER SURGERY     TOTAL KNEE ARTHROPLASTY Right 11/19/2016   Procedure: RIGHT TOTAL KNEE ARTHROPLASTY;  Surgeon: Jerri Kay HERO, MD;  Location: [facility] OR;  Service: Orthopedics;  Laterality: Right;   uretha surgery-2014     Family History  Problem Relation Age of Onset   Breast cancer Mother 26       d.89   Breast cancer Sister 30       d.30   Leukemia Brother 18       d.20   Breast cancer Maternal Aunt 40       d.40s   Lung cancer Maternal Uncle    Prostate cancer Paternal  Uncle    Prostate cancer Brother        recurred recently at age 87   Other Brother 15       spinal tumor   Cervical cancer Other 22       d.22   Cancer Sister 6       unspecified type   Cancer Maternal Uncle 12       unspecified type   Social History   Socioeconomic History   Marital status: Married    Spouse name: Heron   Number of children: Not on file   Years of education: Not on file   Highest education level: 12th grade  Occupational History   Not on file  Tobacco Use   Smoking status: Never    Passive exposure: Never   Smokeless tobacco: Never   Tobacco comments:    Never smoked 09/03/23  Vaping Use   Vaping status: Never Used  Substance and Sexual Activity   Alcohol  use: No    Comment: never   Drug use: No   Sexual activity: Yes  Other Topics Concern   Not on file  Social History Narrative   Not on file   Social Drivers of Health   Financial Resource Strain: Patient Declined (07/06/2023)   Overall Financial Resource Strain (CARDIA)    Difficulty of Paying Living Expenses: Patient declined  Food Insecurity: No Food Insecurity (11/03/2023)    Hunger Vital Sign    Worried About Running Out of Food in the Last Year: Never true    Ran Out of Food in the Last Year: Never true  Transportation Needs: No Transportation Needs (11/03/2023)   PRAPARE - Administrator, Civil Service (Medical): No    Lack of Transportation (Non-Medical): No  Physical Activity: Inactive (12/09/2017)   Exercise Vital Sign    Days of Exercise per Week: 0 days    Minutes of Exercise per Session: 0 min  Stress: No Stress Concern Present (12/09/2017)   [employer] of Occupational Health - Occupational Stress Questionnaire    Feeling of Stress : Only a little  Social Connections: Moderately Isolated (08/17/2023)   Social Connection and Isolation Panel    Frequency of Communication with Friends and Family: More than three times a week    Frequency of Social Gatherings with Friends and Family: Three times a week    Attends Religious Services: Never    Active Member of Clubs or Organizations: No    Attends Banker Meetings: Never    Marital Status: Married    Tobacco Counseling Counseling given: Not Answered Tobacco comments: Never smoked 09/03/23   Clinical Intake:  Pre-visit preparation completed: Yes  Pain : No/denies pain     BMI - recorded: 44 Nutritional Status: BMI > 30  Obese Nutritional Risks: None Diabetes: Yes  How often do you need to have someone help you when you read instructions, pamphlets, or other written materials from your doctor or pharmacy?: 1 - Never         Activities of Daily Living    11/16/2023    9:50 AM 08/16/2023   10:09 AM  In your present state of health, do you have any difficulty performing the following activities:  Hearing? 0 0  Vision? 1 0  Difficulty concentrating or making decisions? 0 0  Walking or climbing stairs? 1   Dressing or bathing? 0   Doing errands, shopping? 1   Comment wife helps   Preparing Food and eating ?  N   Using the Toilet? N   In the past six  months, have you accidently leaked urine? N   Do you have problems with loss of bowel control? N   Managing your Medications? N   Managing your Finances? N   Housekeeping or managing your Housekeeping? N     Patient Care Team: [name], NP as PCP - General (Geriatric Medicine) Mealor, Eulas BRAVO, MD as PCP - Electrophysiology (Cardiology) Santo Stanly LABOR, MD as PCP - Cardiology (Cardiology) Morgan Clayborne CROME, RN as [facility] Management Lelon [facility] ONEIDA DEVONNA as Physician Assistant (Cardiology) Marcey Elspeth PARAS, MD as Consulting Physician (Ophthalmology)  Indicate any recent Medical Services you may have received from other than [facility] providers in the past year (date may be approximate).     Assessment:   This is a routine wellness examination for Thomas [name].  Hearing/Vision screen Vision Screening - Comments:: [facility] Last Exam: 2025   Goals Addressed   None    Depression Screen    11/16/2023    9:29 AM 11/02/2023    8:56 AM 10/05/2023   11:19 AM 10/05/2023   11:05 AM 09/07/2023   10:26 AM 08/02/2023   10:33 AM 07/16/2023    2:18 PM  PHQ 2/9 Scores  PHQ - 2 Score 0 0 0 0 0 0 0    Fall Risk    11/16/2023    9:29 AM 11/02/2023    8:56 AM 08/31/2023    2:47 PM 08/17/2023    3:06 PM 08/02/2023   10:33 AM  Fall Risk   Falls in the past year? 0 0 0  0  Number falls in past yr: 0 0   0  Injury with Fall? 0 0   0  Risk for fall due to : Impaired balance/gait No Fall Risks  Impaired balance/gait;Other (Comment) Impaired balance/gait;Impaired mobility  Risk for fall due to: Comment    oxygen  tubing, reports being lightheaded with exertion/shortness of breath at times.   Follow up Falls evaluation completed Falls evaluation completed  Falls prevention discussed Falls evaluation completed    MEDICARE RISK AT HOME: Medicare Risk at Home Any stairs in or around the home?: Yes If so, are there any without handrails?: No Home free of loose throw rugs in walkways,  pet beds, electrical cords, etc?: Yes Adequate lighting in your home to reduce risk of falls?: Yes Life alert?: No Use of a cane, walker or w/c?: Yes Grab bars in the bathroom?: No Shower chair or bench in shower?: Yes  TIMED UP AND GO:  Was the test performed?  No    Cognitive Function:    12/09/2017   10:30 AM 05/21/2016    9:37 AM  MMSE - Mini Mental State Exam  Orientation to time 4 5   Orientation to Place 5 5   Registration 3 3   Attention/ Calculation 5 4   Recall 2 2   Language- name 2 objects 2 2   Language- repeat 1 1  Language- follow 3 step command 3 3   Language- read & follow direction 1 1   Write a sentence 1 1   Copy design 1 1   Total score 28 28      Data saved with a previous flowsheet row definition        11/16/2023    9:32 AM 11/13/2022    9:49 AM 10/30/2021   10:47 AM 10/24/2020   10:51 AM  6CIT Screen  What Year? 0 points 0 points 0 points 0 points  What month? 0 points 0 points 0 points 0 points  What time? 0 points 0 points 0 points 0 points  Count back from 20 0 points 0 points 0 points 0 points  Months in reverse 0 points 0 points 0 points 0 points  Repeat phrase 0 points 0 points 2 points 10 points  Total Score 0 points 0 points 2 points 10 points    Immunizations Immunization History  Administered Date(s) Administered   Fluad Quad(high Dose 65+) 12/09/2018, 03/15/2020, 01/06/2021   Fluad Trivalent(High Dose 65+) 11/06/2022   INFLUENZA, HIGH DOSE SEASONAL PF 12/14/2016, 11/02/2023   Influenza,inj,Quad PF,6+ Mos 11/22/2013, 12/05/2014, 10/24/2015   Influenza-Unspecified 11/29/2017   PFIZER(Purple Top)SARS-COV-2 Vaccination 07/29/2019, 08/08/2019, 08/19/2019, 01/25/2020   Pneumococcal Conjugate-13 05/23/2014   Pneumococcal Polysaccharide-23 12/13/2012, 05/21/2016    TDAP status: Due, Education has been provided regarding the importance of this vaccine. Advised may receive this vaccine at local pharmacy or Health Dept. Aware to provide  a copy of the vaccination record if obtained from local pharmacy or Health Dept. Verbalized acceptance and understanding.  Flu Vaccine status: Up to date  Pneumococcal vaccine status: Up to date  Covid-19 vaccine status: Information provided on how to obtain vaccines.   Qualifies for Shingles Vaccine? Yes   Zostavax completed No   Shingrix Completed?: No.    Education has been provided regarding the importance of this vaccine. Patient has been advised to call insurance company to determine out of pocket expense if they have not yet received this vaccine. Advised may also receive vaccine at local pharmacy or Health Dept. Verbalized acceptance and understanding.  Screening Tests Health Maintenance  Topic Date Due   DTaP/Tdap/Td (1 - Tdap) 01/03/2024 (Originally 06/26/1969)   COVID-19 Vaccine (5 - 2025-26 season) 02/01/2024 (Originally 10/18/2023)   Zoster Vaccines- Shingrix (1 of 2) 01/31/2029 (Originally 06/26/1969)   HEMOGLOBIN A1C  02/19/2024   FOOT EXAM  05/23/2024   OPHTHALMOLOGY EXAM  05/30/2024   Diabetic kidney evaluation - Urine ACR  08/01/2024   Diabetic kidney evaluation - eGFR measurement  09/09/2024   Medicare Annual Wellness (AWV)  11/15/2024   Colonoscopy  10/19/2028   Pneumococcal Vaccine: 50+ Years  Completed   Influenza Vaccine  Completed   Hepatitis C Screening  Completed   HPV VACCINES  Aged Out   Meningococcal B Vaccine  Aged Out    Health Maintenance  There are no preventive care reminders to display for this patient.   Colorectal cancer screening: Type of screening: Colonoscopy. Completed 2020. Repeat every 10 years  Lung Cancer Screening: (Low Dose CT Chest recommended if Age 85-80 years, 20 pack-year currently smoking OR have quit w/in 15years.) does not qualify.   Lung Cancer Screening Referral: na  Additional Screening:  Hepatitis C Screening: does qualify; Completed   Vision Screening: Recommended annual ophthalmology exams for early detection of  glaucoma and other disorders of the eye. Is the patient up to date with their annual eye exam?  Yes  Who is the provider or what is the name of the office in which the patient attends annual eye exams? [facility] If pt is not established with a provider, would they like to be referred to a provider to establish care? No .   Dental Screening: Recommended annual dental exams for proper oral hygiene  Diabetic Foot Exam: Diabetic Foot Exam: Completed 05/24/2023  Community Resource Referral / Chronic Care Management: CRR required this visit?  No  CCM required this visit?  No     Plan:     I have personally reviewed and noted the following in the patient's chart:   Medical and social history Use of alcohol , tobacco or illicit drugs  Current medications and supplements including opioid prescriptions. Patient is currently taking opioid prescriptions. Information provided to patient regarding non-opioid alternatives. Patient advised to discuss non-opioid treatment plan with their provider. Functional ability and status Nutritional status Physical activity Advanced directives List of other physicians Hospitalizations, surgeries, and ER visits in previous 12 months Vitals Screenings to include cognitive, depression, and falls Referrals and appointments  In addition, I have reviewed and discussed with patient certain preventive protocols, quality metrics, and best practice recommendations. A written personalized care plan for preventive services as well as general preventive health recommendations were provided to patient.     Harlene MARLA An, NP   11/16/2023   After Visit Summary: (MyChart) Due to this being a telephonic visit, the after visit summary with patients personalized plan was offered to patient via MyChart

## 2023-11-16 NOTE — Patient Instructions (Addendum)
  Mr. Rueb , Thank you for taking time to come for your Medicare Wellness Visit. I appreciate your ongoing commitment to your health goals. Please review the following plan we discussed and let me know if I can assist you in the future.   DUE for TDAP- to get at local pharmacy  This is a list of the screening recommended for you and due dates:  Health Maintenance  Topic Date Due   DTaP/Tdap/Td vaccine (1 - Tdap) 01/03/2024*   COVID-19 Vaccine (5 - 2025-26 season) 02/01/2024*   Zoster (Shingles) Vaccine (1 of 2) 01/31/2029*   Hemoglobin A1C  02/19/2024   Complete foot exam   05/23/2024   Eye exam for diabetics  05/30/2024   Yearly kidney health urinalysis for diabetes  08/01/2024   Yearly kidney function blood test for diabetes  09/09/2024   Medicare Annual Wellness Visit  11/15/2024   Colon Cancer Screening  10/19/2028   Pneumococcal Vaccine for age over 55  Completed   Flu Shot  Completed   Hepatitis C Screening  Completed   HPV Vaccine  Aged Out   Meningitis B Vaccine  Aged Out  *Topic was postponed. The date shown is not the original due date.

## 2023-11-23 ENCOUNTER — Other Ambulatory Visit (HOSPITAL_COMMUNITY): Payer: Self-pay

## 2023-11-24 ENCOUNTER — Other Ambulatory Visit (HOSPITAL_COMMUNITY): Payer: Self-pay

## 2023-11-24 NOTE — Progress Notes (Unsigned)
 Cardiology Office Note:    Date:  11/25/2023   ID:  Leonor MALVA Spates, DOB Dec 02, 1950, MRN [id number]  PCP:  [name], NP   [facility] Providers Cardiologist:  Stanly DELENA Leavens, MD Cardiology APP:  Lelon Glendia DASEN, PA-C  Electrophysiologist:  Eulas FORBES Furbish, MD     Referring MD: [name], NP   Chief complaint: 88-month follow-up of HFmrEF  History of Present Illness:    TSUGIO ELISON is a 73 y.o. male with a hx of paroxysmal atrial fibrillation, HFmrEF, DVT and PE 2018, hypertension, DM2, nonobstructive CAD, OSA, prostate cancer and urinary retention with chronic suprapubic catheter presenting to the office today for 21-month follow-up of HFrEF and persistent atrial fibrillation.  Previously seen in 2019 for atrial fibrillation.  Admitted to the hospital May 2025 for A-fib with RVR.  EF 40-45%, moderately reduced RV function on echo.  Patient was not compliant with Xarelto  at home prior to hospitalization, decision was made to continue rate control and repeat cardioversion following 3 weeks of anticoagulation at home.  Underwent cardioversion 07/27/2023.  Following this he was readmitted to the hospital on 08/03/2023 with worsening shortness of breath.  Patient weight had increased 14 pounds since initial cardioversion 1 week prior, was hypoxic with O2 sat 80s on RA.  Admitted for acute on chronic respiratory failure secondary to volume overload, treated with IV Lasix , output 8 L.  Reverted back to atrial fibrillation on the day of discharge with well-controlled heart rate, was 301 lbs At that time.  Felt likely not a candidate for ablation due to chronic O2 use, poor candidate for class Ic drug and Multaq due to CHF.  QT interval too long for sotalol or dofetilide.  Amiodarone considered the best option, however with significant baseline COPD patient did not wish to undergo medication that could affect his lung function.  Had another successful  cardioversion on 08/16/2023.  Seen by PCP on 7/3, his weight had increased to 313 lbs from baseline of 303 lbs.    Last cardiology OV on 08/26/2023, patient was stable. Lasix  was increased to 60 mg daily with instructions to take additional 20 mg as needed if weight increased by more than 3 pounds overnight or 5 pounds in a single week.  Patient presents with his wife to the clinic today, appears stable from a cardiovascular standpoint.  Reports chronic DOE, on 3L O2 consistently, goes up to 4L with significant SOB.  Is comfortable at rest and able to speak in full sentences.  Unsure whether he has had any weight gain of >3 lb overnight or 5 lbs in a week, but tries to check his weight regularly. Able to lie flat with no difficulties breathing.  Reports edema around midsection, mild in LE.  Occasionally feels lightheaded when going up stairs at his house, denies lightheadedness around positional changes. Denies chest pain, palpitations, orthopnea, dizziness, near-syncope, dark/tarry/bloody stools, hematuria.   Past Medical History:  Diagnosis Date   Acute on chronic diastolic CHF (congestive heart failure) (HCC) 06/01/2017   Arthritis    Cancer (HCC) 2010   Prostate   Chronic kidney disease    Diabetes mellitus    Diabetic neuropathy (HCC)    feet   DVT (deep venous thrombosis) (HCC)    Genetic testing 06/22/2016   Mr. Kurowski underwent genetic counseling and testing for hereditary cancer syndromes on 05/14/2016. His results were negative for mutations in all 46 genes analyzed by [facility]'s 46-gene Common Hereditary Cancers Panel.  Genes analyzed include: APC, ATM, AXIN2, BARD1, BMPR1A, BRCA1, BRCA2, BRIP1, CDH1, CDKN2A, CHEK2, CTNNA1, DICER1, EPCAM, GREM1, HOXB13, KIT, MEN1, MLH1, MSH2, MSH3, MSH6, MUTYH, NB   GERD (gastroesophageal reflux disease)    Hypertension    PE (pulmonary thromboembolism) (HCC)    Pneumonia    Sleep apnea    not wearing CPAP   Suprapubic catheter (HCC) 11/17/2019     Past Surgical History:  Procedure Laterality Date   CARDIOVERSION N/A 06/03/2017   Procedure: CARDIOVERSION;  Surgeon: Jeffrie Oneil BROCKS, MD;  Location: [facility];  Service: Cardiovascular;  Laterality: N/A;   CARDIOVERSION N/A 07/27/2023   Procedure: CARDIOVERSION;  Surgeon: Delford Maude BROCKS, MD;  Location: [facility] INVASIVE CV LAB;  Service: Cardiovascular;  Laterality: N/A;   CARDIOVERSION N/A 08/16/2023   Procedure: CARDIOVERSION;  Surgeon: Santo Stanly LABOR, MD;  Location: [facility] INVASIVE CV LAB;  Service: Cardiovascular;  Laterality: N/A;   CATARACT EXTRACTION Left 07/31/2021   Dr.Glenn, [facility]   COLONOSCOPY     COLONOSCOPY WITH PROPOFOL  N/A 10/20/2018   Procedure: COLONOSCOPY WITH PROPOFOL ;  Surgeon: Eda Iha, MD;  Location: [facility];  Service: Gastroenterology;  Laterality: N/A;   HERNIA REPAIR     KNEE SURGERY     MULTIPLE TOOTH EXTRACTIONS     POLYPECTOMY  10/20/2018   Procedure: POLYPECTOMY;  Surgeon: Eda Iha, MD;  Location: [facility];  Service: Gastroenterology;;   PROSTATE SURGERY     RADIOACTIVE SEED IMPLANT     RIGHT/LEFT HEART CATH AND CORONARY ANGIOGRAPHY N/A 07/06/2017   Procedure: RIGHT/LEFT HEART CATH AND CORONARY ANGIOGRAPHY;  Surgeon: Dann Candyce RAMAN, MD;  Location[facility] INVASIVE CV LAB;  Service: Cardiovascular;  Laterality: N/A;   SHOULDER SURGERY     TOTAL KNEE ARTHROPLASTY Right 11/19/2016   Procedure: RIGHT TOTAL KNEE ARTHROPLASTY;  Surgeon: Jerri Kay HERO, MD;  Location: [facility] OR;  Service: Orthopedics;  Laterality: Right;   uretha surgery-2014      Current Medications: Current Meds  Medication Sig   Accu-Chek Softclix Lancets lancets Use to test blood sugar daily   Alcohol  Swabs (B-D SINGLE USE SWABS REGULAR) PADS Use in testing blood sugar daily. Dx: E11.22   aspirin  EC 81 MG tablet Take 81 mg by mouth daily.   atorvastatin  (LIPITOR) 40 MG tablet TAKE 1 TABLET EVERY DAY   Blood Glucose Calibration (ACCU-CHEK  AVIVA) SOLN Use once daily as directed dx E11.22   calcium  carbonate (TUMS - DOSED IN MG ELEMENTAL CALCIUM ) 500 MG chewable tablet Chew 2 tablets by mouth as needed for indigestion or heartburn.   glucose blood (ACCU-CHEK AVIVA PLUS) test strip Use to test blood sugar daily.   hydrALAZINE  (APRESOLINE ) 25 MG tablet TAKE 3 TABLETS THREE TIMES DAILY   isosorbide  mononitrate (IMDUR ) 30 MG 24 hr tablet Take 1 tablet (30 mg total) by mouth daily.   Lancets Misc. (ACCU-CHEK SOFTCLIX LANCET DEV) KIT Use to test blood sugar daily   losartan  (COZAAR ) 50 MG tablet Take 0.5 tablets (25 mg total) by mouth daily.   metFORMIN  (GLUCOPHAGE ) 500 MG tablet TAKE 1 TABLET TWICE DAILY WITH MEALS   metoprolol  tartrate (LOPRESSOR ) 100 MG tablet Take 1 tablet (100 mg total) by mouth 2 (two) times daily.   Multiple Vitamin (MULTIVITAMIN ADULT PO) Take 2 each by mouth daily as needed (immune system).   potassium chloride  SA (KLOR-CON  M) 20 MEQ tablet TAKE 1 TABLET TWICE DAILY   rivaroxaban  (XARELTO ) 20 MG TABS tablet Take 1 tablet (20 mg total) by mouth  daily with supper.   Senna 8.7 MG CHEW Chew 1 tablet by mouth as needed.   spironolactone  (ALDACTONE ) 25 MG tablet Take 1 tablet (25 mg total) by mouth daily.   tirzepatide  (MOUNJARO ) 15 MG/0.5ML Pen Inject 15 mg into the skin once a week.   torsemide (DEMADEX) 20 MG tablet Take 1 tablet (20 mg) by mouth in the morning and 1 tablet (20 mg) in the early afternoon.   [DISCONTINUED] furosemide  (LASIX ) 20 MG tablet TAKE 1 TABLET AS DIRECTED AS NEEDED (Patient taking differently: Take 20 mg by mouth 3 (three) times daily. TAKE 1 TABLET AS DIRECTED AS NEEDED)     Allergies:   Lisinopril    Social History   Socioeconomic History   Marital status: Married    Spouse name: Heron   Number of children: Not on file   Years of education: Not on file   Highest education level: 12th grade  Occupational History   Not on file  Tobacco Use   Smoking status: Never    Passive  exposure: Never   Smokeless tobacco: Never   Tobacco comments:    Never smoked 09/03/23  Vaping Use   Vaping status: Never Used  Substance and Sexual Activity   Alcohol  use: No    Comment: never   Drug use: No   Sexual activity: Yes  Other Topics Concern   Not on file  Social History Narrative   Not on file   Social Drivers of Health   Financial Resource Strain: Patient Declined (07/06/2023)   Overall Financial Resource Strain (CARDIA)    Difficulty of Paying Living Expenses: Patient declined  Food Insecurity: No Food Insecurity (11/03/2023)   Hunger Vital Sign    Worried About Running Out of Food in the Last Year: Never true    Ran Out of Food in the Last Year: Never true  Transportation Needs: No Transportation Needs (11/03/2023)   PRAPARE - Administrator, Civil Service (Medical): No    Lack of Transportation (Non-Medical): No  Physical Activity: Inactive (12/09/2017)   Exercise Vital Sign    Days of Exercise per Week: 0 days    Minutes of Exercise per Session: 0 min  Stress: No Stress Concern Present (12/09/2017)   [employer] of Occupational Health - Occupational Stress Questionnaire    Feeling of Stress : Only a little  Social Connections: Moderately Isolated (08/17/2023)   Social Connection and Isolation Panel    Frequency of Communication with Friends and Family: More than three times a week    Frequency of Social Gatherings with Friends and Family: Three times a week    Attends Religious Services: Never    Active Member of Clubs or Organizations: No    Attends Engineer, structural: Never    Marital Status: Married     Family History: The patient's family history includes Breast cancer (age of onset: 20) in his sister; Breast cancer (age of onset: 45) in his maternal aunt; Breast cancer (age of onset: 6) in his mother; Cancer (age of onset: 23) in his sister; Cancer (age of onset: 68) in his maternal uncle; Cervical cancer (age of onset:  38) in an other family member; Leukemia (age of onset: 76) in his brother; Lung cancer in his maternal uncle; Other (age of onset: 21) in his brother; Prostate cancer in his brother and paternal uncle.  ROS:   Please see the history of present illness.     All other systems reviewed and are  negative.  EKGs/Labs/Other Studies Reviewed:    The following studies were reviewed today:  EKG Interpretation Date/Time:  Thursday November 25 2023 08:28:44 EDT Ventricular Rate:  73 PR Interval:  178 QRS Duration:  92 QT Interval:  438 QTC Calculation: 482 R Axis:   -16  Text Interpretation: Sinus rhythm with occasional Premature ventricular complexes Confirmed by Elspeth Blucher [phone]) on 11/25/2023 8:33:41 AM    Recent Labs: 07/03/2023: TSH 1.058 08/03/2023: ALT 38 08/06/2023: Hemoglobin 11.5; Magnesium  2.2; Platelets 253 08/19/2023: Brain Natriuretic Peptide 501 09/10/2023: BUN 26; Creatinine, Ser 1.53; Potassium 4.3; Sodium 143  Recent Lipid Panel    Component Value Date/Time   CHOL 86 08/19/2023 0934   CHOL 109 11/03/2022 1000   TRIG 46 08/19/2023 0934   HDL 40 08/19/2023 0934   HDL 46 11/03/2022 1000   CHOLHDL 2.2 08/19/2023 0934   VLDL 19 06/01/2017 0236   LDLCALC 33 08/19/2023 0934     Risk Assessment/Calculations:    CHA2DS2-VASc Score = 5   This indicates a 7.2% annual risk of stroke. The patient's score is based upon: CHF History: 1 HTN History: 1 Diabetes History: 1 Stroke History: 0 Vascular Disease History: 1 Age Score: 1 Gender Score: 0          Physical Exam:    VS:  BP 121/77 (BP Location: Left Arm, Patient Position: Sitting, Cuff Size: Normal)   Pulse 73   Ht 5' 9 (1.753 m)   Wt (!) 319 lb (144.7 kg)   SpO2 93%   BMI 47.11 kg/m     Wt Readings from Last 3 Encounters:  11/25/23 (!) 319 lb (144.7 kg)  11/03/23 298 lb (135.2 kg)  11/02/23 297 lb (134.7 kg)     GEN: Morbid obesity, well developed in no acute distress HEENT: Normal NECK: No JVD;  No carotid bruits CARDIAC: RRR, no murmurs, rubs, gallops RESPIRATORY: Decreased in bilateral lower lobes, clear throughout mid and upper lobes posteriorly.  No rales, wheeze, rhonchi. ABDOMIN: Soft, morbidly obese, nontender MUSCULOSKELETAL: +1 nonpitting edema bilateral lower extremities; No deformity  SKIN: Warm and dry NEUROLOGIC:  Alert and oriented x 3 PSYCHIATRIC:  Normal affect   ASSESSMENT:    1. Heart failure with mildly reduced ejection fraction (HFmrEF) (HCC)   2. Coronary artery disease involving native coronary artery of native heart, unspecified whether angina present   3. PAF (paroxysmal atrial fibrillation) (HCC)   4. Primary hypertension   5. Hyperlipidemia, unspecified hyperlipidemia type   6. CTEPH (chronic thromboembolic pulmonary hypertension) (HCC)    PLAN:    In order of problems listed above:  HFmrEF Echo 07/04/2023: LVEF 40-45%, mildly decreased function, global hypokinesis, unable to evaluate diastolic function.  RV SF moderately reduced, RV size normal.  Normal MV and AV valves. Chronic DOE at baseline today, denies orthopnea and SOB at rest Weight up 21 pounds and 21 days Decreased lung sounds bilateral lower lobes, +1 nonpitting edema bilateral LE Reviewed importance of daily weights and a reduced sodium diet.   Instructed to call the office if he gains greater than 3 pounds overnight or 5 pounds in 1 week, as we may need to further adjust his diuretic medication GDMT: Continue losartan  25 mg daily Continue Lopressor  100 mg twice daily Continue potassium 20 mEq twice daily Continue spironolactone  25 mg daily Discussed with pharmacy regarding the transition from Lasix  to torsemide Discontinue Lasix  Start torsemide 20 mg in a.m. and 20 mg in early evening (40 mg total daily) Will order BMP  today for baseline kidney function and electrolytes Will repeat BMP in 1 week after starting torsemide  CAD, nonobstructive EKG: NSR, 73, w/ PVCs Denies CP, SOB  at rest, palpitations, near-syncope Continue aspirin  EC 81 mg daily Continue Lipitor 40 mg daily Continue Lopressor  100 mg twice daily  PAF EKG as above, patient currently in NSR Denies palpitations, states he cannot feel when he goes into A-fib Denies having missed any doses of his anticoagulation Denies excessive bleeding/bruising, dark/tarry/bloody stools, hematuria Continue Xarelto  20 mg daily Continue Lopressor  100 mg twice daily  HTN Reports BPs at home well-controlled Continue hydralazine  75 mg twice daily Continue Imdur  30 mg daily Continue losartan  25 mg daily Continue Lopressor  100 mg twice daily Discussed orthostatic precautions when starting torsemide and how to prevent falls if that develops following start of new medication. Discussed ED precautions in the event a fall or to happen while on Xarelto   HLD, LDL goal < 70 Managed by PCP Lipid panel 08/19/2023: Cholesterol 86, HDL 40, LDL 33, triglycerides 46  Chronic thromboembolic pulmonary hypertension:  Stable dyspnea on exertion, unchanged from baseline Followed by Dr. Shellia of [facility] pulmonology service.  On chronic 4 L/min nasal cannula.   Reviewed strict ED precautions  Follow-up in 2-4 weeks to reevaluate volume status following started torsemide  Medication Adjustments/Labs and Tests Ordered: Current medicines are reviewed at length with the patient today.  Concerns regarding medicines are outlined above.  Orders Placed This Encounter  Procedures   Basic metabolic panel with GFR   Lipid panel   Basic metabolic panel with GFR   EKG [id number]   Meds ordered this encounter  Medications   torsemide (DEMADEX) 20 MG tablet    Sig: Take 1 tablet (20 mg) by mouth in the morning and 1 tablet (20 mg) in the early afternoon.    Dispense:  180 tablet    Refill:  3    Patient Instructions  Medication Instructions:  Stop Lasix  as directed Start Torsemide 20 mg in the morning and 20 mg in the early  afternoon.  *If you need a refill on your cardiac medications before your next appointment, please call your pharmacy*  Lab Work: BMET, Lipid panel today. BMET in 1 week.   Testing/Procedures: NONE ordered at this time of appointment   Follow-Up: At [facility], you and your health needs are our priority.  As part of our continuing mission to provide you with exceptional heart care, our providers are all part of one team.  This team includes your primary Cardiologist (physician) and Advanced Practice Providers or APPs (Physician Assistants and Nurse Practitioners) who all work together to provide you with the care you need, when you need it.  Your next appointment:   2-4 weeks   Provider:   Stanly DELENA Leavens, MD or Damien Braver, NP          We recommend signing up for the patient portal called MyChart.  Sign up information is provided on this After Visit Summary.  MyChart is used to connect with patients for Virtual Visits (Telemedicine).  Patients are able to view lab/test results, encounter notes, upcoming appointments, etc.  Non-urgent messages can be sent to your provider as well.   To learn more about what you can do with MyChart, go to [email or web address].   Other Instructions            Signed, Miriam FORBES Shams, NP  11/25/2023 4:01 PM    [facility]

## 2023-11-25 ENCOUNTER — Other Ambulatory Visit (HOSPITAL_COMMUNITY): Payer: Self-pay

## 2023-11-25 ENCOUNTER — Ambulatory Visit: Admitting: Internal Medicine

## 2023-11-25 ENCOUNTER — Other Ambulatory Visit: Payer: Self-pay

## 2023-11-25 ENCOUNTER — Encounter: Payer: Self-pay | Admitting: Nurse Practitioner

## 2023-11-25 ENCOUNTER — Ambulatory Visit: Attending: Cardiology | Admitting: Emergency Medicine

## 2023-11-25 VITALS — BP 121/77 | HR 73 | Ht 69.0 in | Wt 319.0 lb

## 2023-11-25 DIAGNOSIS — I2510 Atherosclerotic heart disease of native coronary artery without angina pectoris: Secondary | ICD-10-CM | POA: Diagnosis not present

## 2023-11-25 DIAGNOSIS — I5020 Unspecified systolic (congestive) heart failure: Secondary | ICD-10-CM

## 2023-11-25 DIAGNOSIS — I10 Essential (primary) hypertension: Secondary | ICD-10-CM | POA: Diagnosis not present

## 2023-11-25 DIAGNOSIS — E785 Hyperlipidemia, unspecified: Secondary | ICD-10-CM | POA: Diagnosis not present

## 2023-11-25 DIAGNOSIS — I480 Paroxysmal atrial fibrillation: Secondary | ICD-10-CM

## 2023-11-25 DIAGNOSIS — I2724 Chronic thromboembolic pulmonary hypertension: Secondary | ICD-10-CM | POA: Diagnosis not present

## 2023-11-25 LAB — LIPID PANEL

## 2023-11-25 MED ORDER — TORSEMIDE 20 MG PO TABS
20.0000 mg | ORAL_TABLET | Freq: Two times a day (BID) | ORAL | 3 refills | Status: AC
  Filled 2023-11-25: qty 180, 90d supply, fill #0
  Filled 2024-02-13 – 2024-02-14 (×2): qty 180, 90d supply, fill #1

## 2023-11-25 NOTE — Patient Instructions (Signed)
 Medication Instructions:  Stop Lasix  as directed Start Torsemide 20 mg in the morning and 20 mg in the early afternoon.  *If you need a refill on your cardiac medications before your next appointment, please call your pharmacy*  Lab Work: BMET, Lipid panel today. BMET in 1 week.   Testing/Procedures: NONE ordered at this time of appointment   Follow-Up: At [facility], you and your health needs are our priority.  As part of our continuing mission to provide you with exceptional heart care, our providers are all part of one team.  This team includes your primary Cardiologist (physician) and Advanced Practice Providers or APPs (Physician Assistants and Nurse Practitioners) who all work together to provide you with the care you need, when you need it.  Your next appointment:   2-4 weeks   Provider:   Stanly DELENA Leavens, MD or Damien Braver, NP          We recommend signing up for the patient portal called MyChart.  Sign up information is provided on this After Visit Summary.  MyChart is used to connect with patients for Virtual Visits (Telemedicine).  Patients are able to view lab/test results, encounter notes, upcoming appointments, etc.  Non-urgent messages can be sent to your provider as well.   To learn more about what you can do with MyChart, go to [email or web address].   Other Instructions

## 2023-11-26 LAB — BASIC METABOLIC PANEL WITH GFR
BUN/Creatinine Ratio: 14 (ref 10–24)
BUN: 28 mg/dL — ABNORMAL HIGH (ref 8–27)
CO2: 21 mmol/L (ref 20–29)
Calcium: 8.9 mg/dL (ref 8.6–10.2)
Chloride: 106 mmol/L (ref 96–106)
Creatinine, Ser: 1.96 mg/dL — ABNORMAL HIGH (ref 0.76–1.27)
Glucose: 129 mg/dL — ABNORMAL HIGH (ref 70–99)
Potassium: 4.6 mmol/L (ref 3.5–5.2)
Sodium: 145 mmol/L — ABNORMAL HIGH (ref 134–144)
eGFR: 35 mL/min/1.73 — ABNORMAL LOW (ref >59)

## 2023-11-26 LAB — LIPID PANEL
Cholesterol, Total: 89 mg/dL — AB (ref 100–199)
HDL: 40 mg/dL (ref >39)
LDL CALC COMMENT:: 2.2 ratio (ref 0.0–5.0)
LDL Chol Calc (NIH): 36 mg/dL (ref 0–99)
Triglycerides: 53 mg/dL (ref 0–149)
VLDL Cholesterol Cal: 13 mg/dL (ref 5–40)

## 2023-11-30 ENCOUNTER — Ambulatory Visit: Admitting: Internal Medicine

## 2023-11-30 ENCOUNTER — Encounter: Payer: Self-pay | Admitting: Internal Medicine

## 2023-11-30 ENCOUNTER — Ambulatory Visit: Payer: Self-pay | Admitting: Emergency Medicine

## 2023-11-30 ENCOUNTER — Ambulatory Visit: Admitting: *Deleted

## 2023-11-30 VITALS — BP 136/82 | HR 68 | Temp 98.1°F | Ht 69.0 in | Wt 317.0 lb

## 2023-11-30 DIAGNOSIS — Z86711 Personal history of pulmonary embolism: Secondary | ICD-10-CM | POA: Diagnosis not present

## 2023-11-30 DIAGNOSIS — Z7901 Long term (current) use of anticoagulants: Secondary | ICD-10-CM

## 2023-11-30 DIAGNOSIS — R0602 Shortness of breath: Secondary | ICD-10-CM

## 2023-11-30 DIAGNOSIS — D6862 Lupus anticoagulant syndrome: Secondary | ICD-10-CM | POA: Diagnosis not present

## 2023-11-30 DIAGNOSIS — G4733 Obstructive sleep apnea (adult) (pediatric): Secondary | ICD-10-CM

## 2023-11-30 DIAGNOSIS — J9690 Respiratory failure, unspecified, unspecified whether with hypoxia or hypercapnia: Secondary | ICD-10-CM | POA: Diagnosis not present

## 2023-11-30 DIAGNOSIS — I2729 Other secondary pulmonary hypertension: Secondary | ICD-10-CM

## 2023-11-30 DIAGNOSIS — I5043 Acute on chronic combined systolic (congestive) and diastolic (congestive) heart failure: Secondary | ICD-10-CM

## 2023-11-30 DIAGNOSIS — I480 Paroxysmal atrial fibrillation: Secondary | ICD-10-CM

## 2023-11-30 DIAGNOSIS — I482 Chronic atrial fibrillation, unspecified: Secondary | ICD-10-CM

## 2023-11-30 DIAGNOSIS — Z86718 Personal history of other venous thrombosis and embolism: Secondary | ICD-10-CM

## 2023-11-30 DIAGNOSIS — I5082 Biventricular heart failure: Secondary | ICD-10-CM | POA: Diagnosis not present

## 2023-11-30 DIAGNOSIS — J9611 Chronic respiratory failure with hypoxia: Secondary | ICD-10-CM

## 2023-11-30 LAB — PULMONARY FUNCTION TEST
DL/VA % pred: 60 %
DL/VA: 2.42 ml/min/mmHg/L
DLCO cor % pred: 42 %
DLCO cor: 10.48 ml/min/mmHg
DLCO unc % pred: 42 %
DLCO unc: 10.48 ml/min/mmHg
FEF 25-75 Post: 3.91 L/s
FEF 25-75 Pre: 1.55 L/s
FEF2575-%Change-Post: 153 %
FEF2575-%Pred-Post: 175 %
FEF2575-%Pred-Pre: 69 %
FEV1-%Change-Post: 20 %
FEV1-%Pred-Post: 64 %
FEV1-%Pred-Pre: 53 %
FEV1-Post: 1.95 L
FEV1-Pre: 1.63 L
FEV1FVC-%Change-Post: 8 %
FEV1FVC-%Pred-Pre: 109 %
FEV6-%Change-Post: 10 %
FEV6-%Pred-Post: 58 %
FEV6-%Pred-Pre: 52 %
FEV6-Post: 2.27 L
FEV6-Pre: 2.04 L
FEV6FVC-%Pred-Post: 106 %
FEV6FVC-%Pred-Pre: 106 %
FVC-%Change-Post: 10 %
FVC-%Pred-Post: 54 %
FVC-%Pred-Pre: 49 %
FVC-Post: 2.27 L
FVC-Pre: 2.04 L
Post FEV1/FVC ratio: 86 %
Post FEV6/FVC ratio: 100 %
Pre FEV1/FVC ratio: 80 %
Pre FEV6/FVC Ratio: 100 %

## 2023-11-30 NOTE — Progress Notes (Signed)
 Thomas [name]    [id number]    Nov 24, 1950  Primary Care Physician:[name], Harlene POUR, NP Date of Appointment: 11/30/2023 Established Patient Visit  Chief complaint:   Chief Complaint  Patient presents with   Shortness of Breath    PFT result. Patient states his breathing has not improved.     HPI: Thomas [name] is a 73 y.o. man. former patient of Dr. Shellia with OSA on CPAP, chronic Hfpef, and +lupus Anticoagulant with history of DVT and PE in 2017 on lifelong anticoagulation. He is here for a second opinion for his chronic respiratory failure.   Interval Updates: Here for follow up after pulmonary function.   Shows restriction to ventilation, no airflow limitation, reduced dlco. He did have some bronchodilator response which can be seen in obesity related conditions, but did not perceive clinical benefit.   Still short of breath. Does notice when he is in and out of a. Fib. Faithful to CPAP. Download reviewed: 100% adherence, no leak. Median AHI 8.5 Cm H20, 12.0 cm H20 with EPR level 3.   When he takes off oxygen  at rest he drops into the low 80s.  I have reviewed the patient's family social and past medical history and updated as appropriate.   Past Medical History:  Diagnosis Date   Acute on chronic diastolic CHF (congestive heart failure) (HCC) 06/01/2017   Arthritis    Cancer (HCC) 2010   Prostate   Chronic kidney disease    Diabetes mellitus    Diabetic neuropathy (HCC)    feet   DVT (deep venous thrombosis) (HCC)    Genetic testing 06/22/2016   Mr. Ryner underwent genetic counseling and testing for hereditary cancer syndromes on 05/14/2016. His results were negative for mutations in all 46 genes analyzed by [facility]'s 46-gene Common Hereditary Cancers Panel. Genes analyzed include: APC, ATM, AXIN2, BARD1, BMPR1A, BRCA1, BRCA2, BRIP1, CDH1, CDKN2A, CHEK2, CTNNA1, DICER1, EPCAM, GREM1, HOXB13, KIT, MEN1, MLH1, MSH2, MSH3, MSH6, MUTYH, NB   GERD  (gastroesophageal reflux disease)    Hypertension    PE (pulmonary thromboembolism) (HCC)    Pneumonia    Sleep apnea    not wearing CPAP   Suprapubic catheter (HCC) 11/17/2019    Past Surgical History:  Procedure Laterality Date   CARDIOVERSION N/A 06/03/2017   Procedure: CARDIOVERSION;  Surgeon: Jeffrie Oneil BROCKS, MD;  Location: [facility];  Service: Cardiovascular;  Laterality: N/A;   CARDIOVERSION N/A 07/27/2023   Procedure: CARDIOVERSION;  Surgeon: Delford Maude BROCKS, MD;  Location: [facility];  Service: Cardiovascular;  Laterality: N/A;   CARDIOVERSION N/A 08/16/2023   Procedure: CARDIOVERSION;  Surgeon: Santo Stanly LABOR, MD;  Location: [facility];  Service: Cardiovascular;  Laterality: N/A;   CATARACT EXTRACTION Left 07/31/2021   Dr.Glenn, [facility]   COLONOSCOPY     COLONOSCOPY WITH PROPOFOL  N/A 10/20/2018   Procedure: COLONOSCOPY WITH PROPOFOL ;  Surgeon: Eda Iha, MD;  Location: [facility];  Service: Gastroenterology;  Laterality: N/A;   HERNIA REPAIR     KNEE SURGERY     MULTIPLE TOOTH EXTRACTIONS     POLYPECTOMY  10/20/2018   Procedure: POLYPECTOMY;  Surgeon: Eda Iha, MD;  Location: [facility];  Service: Gastroenterology;;   PROSTATE SURGERY     RADIOACTIVE SEED IMPLANT     RIGHT/LEFT HEART CATH AND CORONARY ANGIOGRAPHY N/A 07/06/2017   Procedure: RIGHT/LEFT HEART CATH AND CORONARY ANGIOGRAPHY;  Surgeon: Dann Candyce RAMAN, MD;  Location:  [facility];  Service: Cardiovascular;  Laterality: N/A;   SHOULDER SURGERY     TOTAL KNEE ARTHROPLASTY Right 11/19/2016   Procedure: RIGHT TOTAL KNEE ARTHROPLASTY;  Surgeon: Jerri Kay HERO, MD;  Location: [facility];  Service: Orthopedics;  Laterality: Right;   uretha surgery-2014      Family History  Problem Relation Age of Onset   Breast cancer Mother 23       d.89   Breast cancer Sister 30       d.30   Leukemia Brother 18       d.20   Breast cancer Maternal Aunt 40        d.40s   Lung cancer Maternal Uncle    Prostate cancer Paternal Uncle    Prostate cancer Brother        recurred recently at age 41   Other Brother 15       spinal tumor   Cervical cancer Other 22       d.22   Cancer Sister 20       unspecified type   Cancer Maternal Uncle 4       unspecified type    Social History   Occupational History   Not on file  Tobacco Use   Smoking status: Never    Passive exposure: Never   Smokeless tobacco: Never   Tobacco comments:    Never smoked 09/03/23  Vaping Use   Vaping status: Never Used  Substance and Sexual Activity   Alcohol  use: No    Comment: never   Drug use: No   Sexual activity: Yes     Physical Exam: Blood pressure 136/82, pulse 68, temperature 98.1 F (36.7 C), temperature source Oral, height 5' 9 (1.753 m), weight (!) 317 lb (143.8 kg), SpO2 95%.  Gen:      No acute distress, elderly, chronically ill ENT:  nasal cannula, no nasal polyps, mucus membranes moist Lungs:    diminishedNo increased respiratory effort, symmetric chest wall excursion, clear to auscultation bilaterally, no wheezes or crackles CV:         Regular rate and rhythm; intermittent extra heart sound, le edema   Data Reviewed: Imaging: I have personally reviewed the chest xray June 2025 shows cardiomegaly with pulmonary vascular congestion  PFTs:     Latest Ref Rng & Units 11/30/2023    9:42 AM  PFT Results  FVC-Pre L 2.04  P  FVC-Predicted Pre % 49  P  FVC-Post L 2.27  P  FVC-Predicted Post % 54  P  Pre FEV1/FVC % % 80  P  Post FEV1/FCV % % 86  P  FEV1-Pre L 1.63  P  FEV1-Predicted Pre % 53  P  FEV1-Post L 1.95  P  DLCO uncorrected ml/min/mmHg 10.48  P  DLCO UNC% % 42  P  DLCO corrected ml/min/mmHg 10.48  P  DLCO COR %Predicted % 42  P  DLVA Predicted % 60  P    P Preliminary result   I have personally reviewed the patient's PFTs and no airflow limitation. Spirometry suggests restriction to ventilation but patient was unable to  perform lung volumes. Reduced DLCO consistent with pulmonary vascular disease.   Labs: Lab Results  Component Value Date   NA 145 (H) 11/25/2023   K 4.6 11/25/2023   CO2 21 11/25/2023   GLUCOSE 129 (H) 11/25/2023   BUN 28 (H) 11/25/2023   CREATININE 1.96 (H) 11/25/2023   CALCIUM  8.9 11/25/2023   EGFR 35 (  L) 11/25/2023   GFRNONAA 47 (L) 08/07/2023   Lab Results  Component Value Date   WBC 8.3 08/06/2023   HGB 11.5 (L) 08/06/2023   HCT 36.7 (L) 08/06/2023   MCV 92.0 08/06/2023   PLT 253 08/06/2023    Immunization status: Immunization History  Administered Date(s) Administered   Fluad Quad(high Dose 65+) 12/09/2018, 03/15/2020, 01/06/2021   Fluad Trivalent(High Dose 65+) 11/06/2022   INFLUENZA, HIGH DOSE SEASONAL PF 12/14/2016, 11/02/2023   Influenza,inj,Quad PF,6+ Mos 11/22/2013, 12/05/2014, 10/24/2015   Influenza-Unspecified 11/29/2017   PFIZER(Purple Top)SARS-COV-2 Vaccination 07/29/2019, 08/08/2019, 08/19/2019, 01/25/2020   Pneumococcal Conjugate-13 05/23/2014   Pneumococcal Polysaccharide-23 12/13/2012, 05/21/2016    External Records Personally Reviewed: cardiology, primary care  Assessment and Plan Chronic respiratory failure 4-6LPM - multifactorial from heart failure, OSA, h/o DVT, deconditioning - suspect heart failure likely his biggest issue - never smoker, no COPD - PFTs shows restriction to ventilation from obesity and reduced dlco from pulmonary vascular disease.     Chronic heart biventricular heart  failure with reduced ejection fraction LVEF 40-45%  Secondary Secondary Pulmonary Hypertension Carries diagnosis of CTEPH but hasn't had a Right heart catheterization or V/Q scan.  - Continue oxygen  therapy. - subsequent CT Chest has been negative for PE. Consider V/Q scan or right heart catheterization in the future.  - mainstay of therapy is optimizing GDMT and diuretics.  - Encourage low-salt, low-carb diet. - Encourage maintaining activity level as  tolerated.   Obstructive Sleep Apnea Controlled, continue PAP therapy  History of Pulmonary Embolism and Deep Vein Thrombosis in 2017 Lupus Anticoagulant Syndrome - Continue Xarelto  for lifelong anticoagulation.   Atrial Fibrillation Chronic atrial fibrillation with history of cardioversion and ablation. Requires anticoagulation to prevent thromboembolic events. - Continue Xarelto  for anticoagulation.   Return to Care: Return in about 1 year (around 11/29/2024) for Dr Theodoro.   Thomas Gore, MD Pulmonary and Critical Care Medicine [facility] [phone]

## 2023-11-30 NOTE — Progress Notes (Signed)
 Spirometry pre/post and diffusion capacity performed. Patient  unable to bend knees that would allow door to close to obtain lung volumes. Attempted N2 washout x 2 without success.

## 2023-11-30 NOTE — Patient Instructions (Signed)
 It was a pleasure to see you today!  Please schedule follow up with Dr Theodoro in 12 months.  If my schedule is not open yet, we will contact you with a reminder closer to that time. Please call [phone] if you haven't heard from us  a month before, and always call us  sooner if issues or concerns arise. You can also send us  a message through MyChart, but but aware that this is not to be used for urgent issues and it may take up to 5-7 days to receive a reply. Please be aware that you will likely be able to view your results before I have a chance to respond to them. Please give us  5 business days to respond to any non-urgent results.    Continue wearing your oxygen  at all times to keep saturations over 90%.   Continue wearing CPAP.   Your breathing testing shows that your oxygen  needs and shortness of breath are most likely related to the chronic heart issues.   Please make sure you take your lasix  as prescribed to keep the fluid off. This will help your breathing long term.   Continue xarelto .   Let your cardiology team know if you are going into Atrial fibrillation frequently.   Please call us  sooner if any issues or concerns.

## 2023-11-30 NOTE — Patient Instructions (Signed)
 Spirometry pre/post and diffusion capacity performed today.

## 2023-12-01 NOTE — Telephone Encounter (Signed)
 Spoke with pt regarding lab results. Pt verbalized understanding. Pt was advised to come in to repeat labs in one week. Pt agreed. Pt had no further questions or concerns.

## 2023-12-02 ENCOUNTER — Other Ambulatory Visit: Payer: Self-pay | Admitting: Emergency Medicine

## 2023-12-02 ENCOUNTER — Other Ambulatory Visit: Payer: Self-pay

## 2023-12-02 DIAGNOSIS — I5020 Unspecified systolic (congestive) heart failure: Secondary | ICD-10-CM

## 2023-12-02 NOTE — Patient Outreach (Addendum)
 Complex Care Management   Visit Note  12/02/2023  Name:  Thomas [name] MRN: [id number] DOB: [birth year]  Situation: Referral received for Complex Care Management related to  CHF, Pulmonary Hypertension, OSA w/CPAP, PAF (paroxysmal atrial fibrillation), Urinary Retention with Suprapubic Catheter. I obtained verbal consent from Patient.  Visit completed with Patient on the phone.  Background:   Past Medical History:  Diagnosis Date   Acute on chronic diastolic CHF (congestive heart failure) (HCC) 06/01/2017   Arthritis    Cancer (HCC) 2010   Prostate   Chronic kidney disease    Diabetes mellitus    Diabetic neuropathy (HCC)    feet   DVT (deep venous thrombosis) (HCC)    Genetic testing 06/22/2016   Mr. Thomas [name] underwent genetic counseling and testing for hereditary cancer syndromes on 05/14/2016. His results were negative for mutations in all 46 genes analyzed by [facility]'s 46-gene Common Hereditary Cancers Panel. Genes analyzed include: APC, ATM, AXIN2, BARD1, BMPR1A, BRCA1, BRCA2, BRIP1, CDH1, CDKN2A, CHEK2, CTNNA1, DICER1, EPCAM, GREM1, HOXB13, KIT, MEN1, MLH1, MSH2, MSH3, MSH6, MUTYH, NB   GERD (gastroesophageal reflux disease)    Hypertension    PE (pulmonary thromboembolism) (HCC)    Pneumonia    Sleep apnea    not wearing CPAP   Suprapubic catheter (HCC) 11/17/2019    Assessment: Patient Reported Symptoms:  Cognitive Cognitive Status: Alert and oriented to person, place, and time, Normal speech and language skills Cognitive/Intellectual Conditions Management [RPT]: None reported or documented in medical history or problem list   Health Maintenance Behaviors: Annual physical exam Health Facilitated by: Rest  Neurological Neurological Review of Symptoms: No symptoms reported    HEENT HEENT Symptoms Reported: No symptoms reported      Cardiovascular Cardiovascular Symptoms Reported: Swelling in legs or feet Does patient have uncontrolled Hypertension?:  No Cardiovascular Management Strategies: Adequate rest, Medication therapy, Routine screening, Diet modification Cardiovascular Self-Management Outcome: 3 (uncertain) Sent in basket message to Cardiology provider requesting a referral to the heart failure Paramedic Program   Respiratory Respiratory Symptoms Reported: Shortness of breath Respiratory Management Strategies: Routine screening, Medication therapy, Oxygen  therapy, CPAP, Adequate rest Respiratory Self-Management Outcome: 3 (uncertain)  Endocrine Endocrine Symptoms Reported: Not assessed    Gastrointestinal Gastrointestinal Symptoms Reported: Not assessed      Genitourinary Genitourinary Symptoms Reported: Other Other Genitourinary Symptoms: urinary retention; suprapubic catheter Genitourinary Management Strategies: Catheter, indwelling (Suprapubic Catheter) Indwelling Catheter Inserted: 11/15/23 Genitourinary Self-Management Outcome: 4 (good)  Integumentary Integumentary Symptoms Reported: Not assessed    Musculoskeletal Musculoskelatal Symptoms Reviewed: Difficulty walking, Limited mobility Musculoskeletal Management Strategies: Medical device, Routine screening Musculoskeletal Self-Management Outcome: 3 (uncertain) Falls in the past year?: No Number of falls in past year: 1 or less Was there an injury with Fall?: No Fall Risk Category Calculator: 0 Patient Fall Risk Level: Low Fall Risk Patient at Risk for Falls Due to: Impaired balance/gait, Impaired mobility Fall risk Follow up: Falls evaluation completed, Education provided  Psychosocial Psychosocial Symptoms Reported: Not assessed     Quality of Family Relationships: involved, helpful, supportive Do you feel physically threatened by others?: No    12/02/2023    PHQ2-9 Depression Screening   Thomas [name] interest or pleasure in doing things    Feeling down, depressed, or hopeless    PHQ-2 - Total Score    Trouble falling or staying asleep, or sleeping too much     Feeling tired or having Thomas [name] energy    Poor appetite or overeating     Feeling bad about  yourself - or that you are a failure or have let yourself or your family down    Trouble concentrating on things, such as reading the newspaper or watching television    Moving or speaking so slowly that other people could have noticed.  Or the opposite - being so fidgety or restless that you have been moving around a lot more than usual    Thoughts that you would be better off dead, or hurting yourself in some way    PHQ2-9 Total Score    If you checked off any problems, how difficult have these problems made it for you to do your work, take care of things at home, or get along with other people    Depression Interventions/Treatment      There were no vitals filed for this visit.  Medications Reviewed Today     Reviewed by Morgan Clayborne CROME, RN (Registered Nurse) on 12/02/23 at 1121  Med List Status: <None>   Medication Order Taking? Sig Documenting Provider Last Dose Status Informant  Accu-Chek Softclix Lancets lancets [id number]  Use to test blood sugar daily [name], NP  Active Self, Pharmacy Records  Alcohol  Swabs (B-D SINGLE USE SWABS REGULAR) PADS [id number]  Use in testing blood sugar daily. Dx: E11.22 [name], NP  Active Self, Pharmacy Records  aspirin  EC 81 MG tablet [id number]  Take 81 mg by mouth daily. [provider]  Active Self, Pharmacy Records  atorvastatin  (LIPITOR) 40 MG tablet [id number]  TAKE 1 TABLET EVERY DAY [name], Jessica K, NP  Active   Blood Glucose Calibration (ACCU-CHEK AVIVA) SOLN [id number]  Use once daily as directed dx E11.22 [name], Jessica K, NP  Active Self, Pharmacy Records  calcium  carbonate (TUMS - DOSED IN MG ELEMENTAL CALCIUM ) 500 MG chewable tablet [id number]  Chew 2 tablets by mouth as needed for indigestion or heartburn. [provider]  Active Self, Pharmacy Records  glucose blood (ACCU-CHEK AVIVA PLUS) test strip  [id number]  Use to test blood sugar daily. [name], NP  Active   hydrALAZINE  (APRESOLINE ) 25 MG tablet [id number]  TAKE 3 TABLETS THREE TIMES DAILY  Patient taking differently: Taking 75 Mg 3 times daily   [name], Jessica K, NP  Active   isosorbide  mononitrate (IMDUR ) 30 MG 24 hr tablet [id number]  Take 1 tablet (30 mg total) by mouth daily. [name], NP  Active   Lancets Misc. (ACCU-CHEK SOFTCLIX LANCET DEV) KIT [id number]  Use to test blood sugar daily [name], NP  Active Self, Pharmacy Records  losartan  (COZAAR ) 50 MG tablet [id number]  Take 0.5 tablets (25 mg total) by mouth daily. [name], NP  Active   metFORMIN  (GLUCOPHAGE ) 500 MG tablet [id number]  TAKE 1 TABLET TWICE DAILY WITH MEALS [name], Jessica K, NP  Active Self, Pharmacy Records           Med Note (SATTERFIELD, TEENA BRAVO   Tue Aug 03, 2023  5:38 PM)    metoprolol  tartrate (LOPRESSOR ) 100 MG tablet [id number]  Take 1 tablet (100 mg total) by mouth 2 (two) times daily. Meng, Hao, [place]  Active   Multiple Vitamin (MULTIVITAMIN ADULT PO) [id number]  Take 2 each by mouth daily as needed (immune system). [provider]  Active Self, Pharmacy Records  potassium chloride  SA (KLOR-CON  M) 20 MEQ tablet [id number]  TAKE 1 TABLET TWICE DAILY Caro Jessica K, NP  Active Self, Pharmacy Records  Med Note (SATTERFIELD, DARIUS E   Tue Aug 03, 2023  5:39 PM)    rivaroxaban  (XARELTO ) 20 MG TABS tablet [id number]  Take 1 tablet (20 mg total) by mouth daily with supper. Pokhrel, Laxman, MD  Active            Med Note (SATTERFIELD, TEENA BRAVO   Tue Aug 03, 2023  5:39 PM)    Senna 8.7 MG CHEW [id number]  Chew 1 tablet by mouth as needed. [name], NP  Active Self, Pharmacy Records  spironolactone  (ALDACTONE ) 25 MG tablet [id number]  Take 1 tablet (25 mg total) by mouth daily. Santo Stanly LABOR, MD  Active   tirzepatide  (MOUNJARO ) 15 MG/0.5ML Pen [id number]  Inject 15 mg into the skin once a  week.  Patient taking differently: Inject 15 mg into the skin once a week. Taking 20 mg   Chandrasekhar, Mahesh A, MD  Active            Med Note (SATTERFIELD, TEENA BRAVO   Tue Aug 03, 2023  5:36 PM) Take on sundays  torsemide (DEMADEX) 20 MG tablet [id number]  Take 1 tablet (20 mg) by mouth in the morning and 1 tablet (20 mg) in the early afternoon. Campbell, Kenzie E, NP  Active             Recommendation:   Specialty provider follow-up on 12/15/23 at 10:05 AM with  Daneen Damien BROCKS, NP Department: [facility]   Follow Up Plan:   Telephone follow up appointment date/time:  Wednesday, November 5 at 11:30 AM Referral to Aging Gracefully via secure email to [email or web address]   Clayborne Ly RN BSN CCM [facility]  [facility], [facility] Health Nurse Care Coordinator  Direct Dial: [phone] Website: [email or web address]

## 2023-12-02 NOTE — Progress Notes (Signed)
 Orders only encounter

## 2023-12-02 NOTE — Patient Instructions (Signed)
 Visit Information  Thank you for taking time to visit with me today. Please don't hesitate to contact me if I can be of assistance to you before our next scheduled appointment.  Your next care management appointment is by telephone on Wednesday, November 5 at 11:30 AM  Please call the care guide team at [phone] if you need to cancel, schedule, or reschedule an appointment.   Please call [phone]-TALK (toll free, 24 hour hotline) if you are experiencing a Mental Health or Behavioral Health Crisis or need someone to talk to.  Clayborne Ly RN BSN CCM [place]  [facility], [facility] Health Nurse Care Coordinator  Direct Dial: [phone] Website: [email or web address]

## 2023-12-04 ENCOUNTER — Other Ambulatory Visit: Payer: Self-pay | Admitting: Nurse Practitioner

## 2023-12-04 DIAGNOSIS — E1122 Type 2 diabetes mellitus with diabetic chronic kidney disease: Secondary | ICD-10-CM

## 2023-12-07 ENCOUNTER — Other Ambulatory Visit (HOSPITAL_COMMUNITY): Payer: Self-pay

## 2023-12-07 ENCOUNTER — Other Ambulatory Visit: Payer: Self-pay

## 2023-12-07 ENCOUNTER — Encounter (HOSPITAL_COMMUNITY): Payer: Self-pay

## 2023-12-07 DIAGNOSIS — E113393 Type 2 diabetes mellitus with moderate nonproliferative diabetic retinopathy without macular edema, bilateral: Secondary | ICD-10-CM | POA: Diagnosis not present

## 2023-12-07 DIAGNOSIS — H468 Other optic neuritis: Secondary | ICD-10-CM | POA: Diagnosis not present

## 2023-12-07 LAB — OPHTHALMOLOGY REPORT-SCANNED

## 2023-12-07 MED ORDER — BRIMONIDINE TARTRATE 0.2 % OP SOLN
1.0000 [drp] | Freq: Two times a day (BID) | OPHTHALMIC | 4 refills | Status: AC
  Filled 2023-12-07: qty 5, 50d supply, fill #0

## 2023-12-07 MED ORDER — BRIMONIDINE TARTRATE 0.1 % OP SOLN
1.0000 [drp] | Freq: Two times a day (BID) | OPHTHALMIC | 4 refills | Status: DC
  Filled 2023-12-07 (×2): qty 10, 90d supply, fill #0

## 2023-12-10 ENCOUNTER — Telehealth: Payer: Self-pay

## 2023-12-10 NOTE — Telephone Encounter (Signed)
 Please call patient and set up an appt- can be virtual

## 2023-12-10 NOTE — Telephone Encounter (Signed)
 Copied from CRM #[id number]. Topic: Clinical - Home Health Verbal Orders >> Dec 10, 2023 11:38 AM Alfonso ORN wrote: Caller/Agency: Sherrell with  [facility] Number: [phone] Service Requested: patient family is requesting palliative care program  Frequency: PRN Any new concerns about the patient? Yes increase shortness of breath  Message sent to [name], NP

## 2023-12-13 ENCOUNTER — Telehealth: Payer: Self-pay | Admitting: Internal Medicine

## 2023-12-13 ENCOUNTER — Inpatient Hospital Stay (HOSPITAL_COMMUNITY)
Admission: EM | Admit: 2023-12-13 | Discharge: 2023-12-22 | DRG: 291 | Disposition: A | Discharge status: Home Health | Attending: Internal Medicine | Admitting: Internal Medicine

## 2023-12-13 ENCOUNTER — Other Ambulatory Visit: Payer: Self-pay

## 2023-12-13 ENCOUNTER — Emergency Department (HOSPITAL_COMMUNITY)

## 2023-12-13 ENCOUNTER — Encounter (HOSPITAL_COMMUNITY): Payer: Self-pay

## 2023-12-13 DIAGNOSIS — Z7984 Long term (current) use of oral hypoglycemic drugs: Secondary | ICD-10-CM

## 2023-12-13 DIAGNOSIS — J9611 Chronic respiratory failure with hypoxia: Secondary | ICD-10-CM | POA: Diagnosis present

## 2023-12-13 DIAGNOSIS — I5043 Acute on chronic combined systolic (congestive) and diastolic (congestive) heart failure: Secondary | ICD-10-CM | POA: Diagnosis present

## 2023-12-13 DIAGNOSIS — Z9981 Dependence on supplemental oxygen: Secondary | ICD-10-CM

## 2023-12-13 DIAGNOSIS — R0602 Shortness of breath: Secondary | ICD-10-CM | POA: Diagnosis not present

## 2023-12-13 DIAGNOSIS — Z7901 Long term (current) use of anticoagulants: Secondary | ICD-10-CM | POA: Diagnosis not present

## 2023-12-13 DIAGNOSIS — Z806 Family history of leukemia: Secondary | ICD-10-CM

## 2023-12-13 DIAGNOSIS — E1122 Type 2 diabetes mellitus with diabetic chronic kidney disease: Secondary | ICD-10-CM | POA: Diagnosis not present

## 2023-12-13 DIAGNOSIS — Z6841 Body Mass Index (BMI) 40.0 and over, adult: Secondary | ICD-10-CM | POA: Diagnosis not present

## 2023-12-13 DIAGNOSIS — I130 Hypertensive heart and chronic kidney disease with heart failure and stage 1 through stage 4 chronic kidney disease, or unspecified chronic kidney disease: Secondary | ICD-10-CM | POA: Diagnosis not present

## 2023-12-13 DIAGNOSIS — Z888 Allergy status to other drugs, medicaments and biological substances status: Secondary | ICD-10-CM

## 2023-12-13 DIAGNOSIS — Z8701 Personal history of pneumonia (recurrent): Secondary | ICD-10-CM

## 2023-12-13 DIAGNOSIS — R918 Other nonspecific abnormal finding of lung field: Secondary | ICD-10-CM | POA: Diagnosis not present

## 2023-12-13 DIAGNOSIS — R000 Tachycardia, unspecified: Secondary | ICD-10-CM | POA: Diagnosis not present

## 2023-12-13 DIAGNOSIS — E785 Hyperlipidemia, unspecified: Secondary | ICD-10-CM | POA: Diagnosis present

## 2023-12-13 DIAGNOSIS — Z91148 Patient's other noncompliance with medication regimen for other reason: Secondary | ICD-10-CM

## 2023-12-13 DIAGNOSIS — J9811 Atelectasis: Secondary | ICD-10-CM | POA: Diagnosis present

## 2023-12-13 DIAGNOSIS — I509 Heart failure, unspecified: Principal | ICD-10-CM

## 2023-12-13 DIAGNOSIS — J449 Chronic obstructive pulmonary disease, unspecified: Secondary | ICD-10-CM | POA: Diagnosis present

## 2023-12-13 DIAGNOSIS — Z7409 Other reduced mobility: Secondary | ICD-10-CM | POA: Diagnosis present

## 2023-12-13 DIAGNOSIS — I480 Paroxysmal atrial fibrillation: Secondary | ICD-10-CM | POA: Diagnosis not present

## 2023-12-13 DIAGNOSIS — I2722 Pulmonary hypertension due to left heart disease: Secondary | ICD-10-CM | POA: Diagnosis present

## 2023-12-13 DIAGNOSIS — E1140 Type 2 diabetes mellitus with diabetic neuropathy, unspecified: Secondary | ICD-10-CM | POA: Diagnosis present

## 2023-12-13 DIAGNOSIS — Z7985 Long-term (current) use of injectable non-insulin antidiabetic drugs: Secondary | ICD-10-CM | POA: Diagnosis not present

## 2023-12-13 DIAGNOSIS — Z993 Dependence on wheelchair: Secondary | ICD-10-CM

## 2023-12-13 DIAGNOSIS — I2723 Pulmonary hypertension due to lung diseases and hypoxia: Secondary | ICD-10-CM | POA: Diagnosis present

## 2023-12-13 DIAGNOSIS — T45516A Underdosing of anticoagulants, initial encounter: Secondary | ICD-10-CM | POA: Diagnosis present

## 2023-12-13 DIAGNOSIS — Z86711 Personal history of pulmonary embolism: Secondary | ICD-10-CM

## 2023-12-13 DIAGNOSIS — Z9359 Other cystostomy status: Secondary | ICD-10-CM | POA: Diagnosis not present

## 2023-12-13 DIAGNOSIS — E876 Hypokalemia: Secondary | ICD-10-CM | POA: Diagnosis not present

## 2023-12-13 DIAGNOSIS — Z7982 Long term (current) use of aspirin: Secondary | ICD-10-CM

## 2023-12-13 DIAGNOSIS — I4892 Unspecified atrial flutter: Secondary | ICD-10-CM | POA: Diagnosis not present

## 2023-12-13 DIAGNOSIS — R9389 Abnormal findings on diagnostic imaging of other specified body structures: Secondary | ICD-10-CM | POA: Diagnosis not present

## 2023-12-13 DIAGNOSIS — J9601 Acute respiratory failure with hypoxia: Secondary | ICD-10-CM | POA: Diagnosis not present

## 2023-12-13 DIAGNOSIS — D509 Iron deficiency anemia, unspecified: Secondary | ICD-10-CM | POA: Diagnosis present

## 2023-12-13 DIAGNOSIS — N179 Acute kidney failure, unspecified: Secondary | ICD-10-CM | POA: Diagnosis not present

## 2023-12-13 DIAGNOSIS — Z87440 Personal history of urinary (tract) infections: Secondary | ICD-10-CM

## 2023-12-13 DIAGNOSIS — N1832 Chronic kidney disease, stage 3b: Secondary | ICD-10-CM | POA: Diagnosis present

## 2023-12-13 DIAGNOSIS — R339 Retention of urine, unspecified: Secondary | ICD-10-CM | POA: Diagnosis present

## 2023-12-13 DIAGNOSIS — I5033 Acute on chronic diastolic (congestive) heart failure: Secondary | ICD-10-CM | POA: Diagnosis not present

## 2023-12-13 DIAGNOSIS — I5023 Acute on chronic systolic (congestive) heart failure: Secondary | ICD-10-CM | POA: Diagnosis not present

## 2023-12-13 DIAGNOSIS — Z8546 Personal history of malignant neoplasm of prostate: Secondary | ICD-10-CM

## 2023-12-13 DIAGNOSIS — I4891 Unspecified atrial fibrillation: Secondary | ICD-10-CM | POA: Diagnosis not present

## 2023-12-13 DIAGNOSIS — I272 Pulmonary hypertension, unspecified: Secondary | ICD-10-CM | POA: Diagnosis not present

## 2023-12-13 DIAGNOSIS — I2510 Atherosclerotic heart disease of native coronary artery without angina pectoris: Secondary | ICD-10-CM | POA: Diagnosis present

## 2023-12-13 DIAGNOSIS — Z86718 Personal history of other venous thrombosis and embolism: Secondary | ICD-10-CM

## 2023-12-13 DIAGNOSIS — Z801 Family history of malignant neoplasm of trachea, bronchus and lung: Secondary | ICD-10-CM

## 2023-12-13 DIAGNOSIS — I50813 Acute on chronic right heart failure: Secondary | ICD-10-CM | POA: Diagnosis not present

## 2023-12-13 DIAGNOSIS — E662 Morbid (severe) obesity with alveolar hypoventilation: Secondary | ICD-10-CM | POA: Diagnosis not present

## 2023-12-13 DIAGNOSIS — Z79899 Other long term (current) drug therapy: Secondary | ICD-10-CM

## 2023-12-13 DIAGNOSIS — Z96651 Presence of right artificial knee joint: Secondary | ICD-10-CM | POA: Diagnosis present

## 2023-12-13 DIAGNOSIS — Z803 Family history of malignant neoplasm of breast: Secondary | ICD-10-CM

## 2023-12-13 DIAGNOSIS — R9431 Abnormal electrocardiogram [ECG] [EKG]: Secondary | ICD-10-CM | POA: Diagnosis not present

## 2023-12-13 DIAGNOSIS — Z8042 Family history of malignant neoplasm of prostate: Secondary | ICD-10-CM

## 2023-12-13 DIAGNOSIS — I110 Hypertensive heart disease with heart failure: Secondary | ICD-10-CM | POA: Diagnosis not present

## 2023-12-13 LAB — CBC
HCT: 38.9 % — ABNORMAL LOW (ref 39.0–52.0)
Hemoglobin: 12 g/dL — ABNORMAL LOW (ref 13.0–17.0)
MCH: 28.8 pg (ref 26.0–34.0)
MCHC: 30.8 g/dL (ref 30.0–36.0)
MCV: 93.3 fL (ref 80.0–100.0)
Platelets: 263 K/uL (ref 150–400)
RBC: 4.17 MIL/uL — ABNORMAL LOW (ref 4.22–5.81)
RDW: 15.9 % — ABNORMAL HIGH (ref 11.5–15.5)
WBC: 7.7 K/uL (ref 4.0–10.5)
nRBC: 0 % (ref 0.0–0.2)

## 2023-12-13 LAB — TROPONIN I (HIGH SENSITIVITY)
Troponin I (High Sensitivity): 46 ng/L — ABNORMAL HIGH (ref <18)
Troponin I (High Sensitivity): 45 ng/L — ABNORMAL HIGH (ref <18)

## 2023-12-13 LAB — BASIC METABOLIC PANEL WITH GFR
Anion gap: 14 (ref 5–15)
BUN: 44 mg/dL — ABNORMAL HIGH (ref 8–23)
CO2: 26 mmol/L (ref 22–32)
Calcium: 8.8 mg/dL — ABNORMAL LOW (ref 8.9–10.3)
Chloride: 104 mmol/L (ref 98–111)
Creatinine, Ser: 2.21 mg/dL — ABNORMAL HIGH (ref 0.61–1.24)
GFR, Estimated: 31 mL/min — ABNORMAL LOW (ref >60)
Glucose, Bld: 181 mg/dL — ABNORMAL HIGH (ref 70–99)
Potassium: 3.8 mmol/L (ref 3.5–5.1)
Sodium: 144 mmol/L (ref 135–145)

## 2023-12-13 LAB — BRAIN NATRIURETIC PEPTIDE: B Natriuretic Peptide: 809.1 pg/mL — ABNORMAL HIGH (ref 0.0–100.0)

## 2023-12-13 MED ORDER — ASPIRIN 81 MG PO TBEC
81.0000 mg | DELAYED_RELEASE_TABLET | Freq: Every day | ORAL | Status: DC
  Administered 2023-12-13 – 2023-12-16 (×4): 81 mg via ORAL
  Filled 2023-12-13 (×4): qty 1

## 2023-12-13 MED ORDER — FUROSEMIDE 10 MG/ML IJ SOLN
80.0000 mg | Freq: Once | INTRAMUSCULAR | Status: AC
  Administered 2023-12-13: 80 mg via INTRAVENOUS
  Filled 2023-12-13: qty 8

## 2023-12-13 MED ORDER — ATORVASTATIN CALCIUM 40 MG PO TABS
40.0000 mg | ORAL_TABLET | Freq: Every day | ORAL | Status: DC
  Administered 2023-12-13 – 2023-12-22 (×10): 40 mg via ORAL
  Filled 2023-12-13 (×10): qty 1

## 2023-12-13 MED ORDER — FUROSEMIDE 10 MG/ML IJ SOLN
80.0000 mg | Freq: Two times a day (BID) | INTRAMUSCULAR | Status: AC
  Administered 2023-12-13 – 2023-12-14 (×3): 80 mg via INTRAVENOUS
  Filled 2023-12-13 (×3): qty 8

## 2023-12-13 MED ORDER — ENOXAPARIN SODIUM 40 MG/0.4ML IJ SOSY: 40.0000 mg | PREFILLED_SYRINGE | INTRAMUSCULAR | Status: DC

## 2023-12-13 MED ORDER — BRIMONIDINE TARTRATE 0.2 % OP SOLN
1.0000 [drp] | Freq: Two times a day (BID) | OPHTHALMIC | Status: DC
  Administered 2023-12-14 – 2023-12-21 (×16): 1 [drp] via OPHTHALMIC
  Filled 2023-12-13: qty 5

## 2023-12-13 MED ORDER — LOSARTAN POTASSIUM 25 MG PO TABS
25.0000 mg | ORAL_TABLET | Freq: Every day | ORAL | Status: DC
  Administered 2023-12-13 – 2023-12-14 (×2): 25 mg via ORAL
  Filled 2023-12-13 (×2): qty 1

## 2023-12-13 MED ORDER — ISOSORBIDE MONONITRATE ER 30 MG PO TB24
30.0000 mg | ORAL_TABLET | Freq: Every day | ORAL | Status: DC
Start: 2023-12-13 — End: 2023-12-22
  Administered 2023-12-13 – 2023-12-22 (×10): 30 mg via ORAL
  Filled 2023-12-13 (×10): qty 1

## 2023-12-13 MED ORDER — DILTIAZEM HCL-DEXTROSE 125-5 MG/125ML-% IV SOLN (PREMIX)
5.0000 mg/h | INTRAVENOUS | Status: DC
  Administered 2023-12-13: 5 mg/h via INTRAVENOUS
  Filled 2023-12-13: qty 125

## 2023-12-13 MED ORDER — SPIRONOLACTONE 25 MG PO TABS
25.0000 mg | ORAL_TABLET | Freq: Every day | ORAL | Status: DC
Start: 2023-12-13 — End: 2023-12-14
  Administered 2023-12-13 – 2023-12-14 (×2): 25 mg via ORAL
  Filled 2023-12-13: qty 2
  Filled 2023-12-13: qty 1

## 2023-12-13 MED ORDER — RIVAROXABAN 20 MG PO TABS
20.0000 mg | ORAL_TABLET | Freq: Every day | ORAL | Status: DC
Start: 2023-12-13 — End: 2023-12-20
  Administered 2023-12-13 – 2023-12-19 (×7): 20 mg via ORAL
  Filled 2023-12-13 (×2): qty 1
  Filled 2023-12-13: qty 2
  Filled 2023-12-13 (×4): qty 1

## 2023-12-13 MED ORDER — METOPROLOL TARTRATE 25 MG PO TABS
100.0000 mg | ORAL_TABLET | Freq: Two times a day (BID) | ORAL | Status: DC
Start: 2023-12-13 — End: 2023-12-16
  Administered 2023-12-13 – 2023-12-16 (×6): 100 mg via ORAL
  Filled 2023-12-13 (×4): qty 1
  Filled 2023-12-13: qty 4
  Filled 2023-12-13: qty 1

## 2023-12-13 NOTE — ED Provider Triage Note (Signed)
 Emergency Medicine Provider Triage Evaluation Note  Thomas [name] , a 73 y.o. male  was evaluated in triage.  Pt complains of shortness of breath, orthopnea. EMS states pt HR is in afib with a rate in the 130s for them.   Review of Systems  Positive: Shortness of breath, worse with exertion, orthopnea, PND Negative: Chest pain   Physical Exam  There were no vitals taken for this visit. Gen:   Awake, no distress   Resp:  Normal effort  MSK:   Moves extremities without difficulty     Medical Decision Making  Medically screening exam initiated at 11:32 AM.  Appropriate orders placed.  Thomas [name] was informed that the remainder of the evaluation will be completed by another provider, this initial triage assessment does not replace that evaluation, and the importance of remaining in the ED until their evaluation is complete.     Thomas Duwaine CROME, DO 12/13/23 1133

## 2023-12-13 NOTE — Telephone Encounter (Signed)
 Are you okay with filling out FMLA?

## 2023-12-13 NOTE — ED Notes (Signed)
 Phlebotomy at bedside.

## 2023-12-13 NOTE — ED Notes (Signed)
 Called pharmacy to verify medications

## 2023-12-13 NOTE — ED Provider Notes (Signed)
 [place] EMERGENCY DEPARTMENT AT [facility] Provider Note   CSN: [id number] Arrival date & time: 12/13/23  1122     Patient presents with: Shortness of Breath   Thomas [name] is a 73 y.o. male.   73 year old male presents for evaluation of shortness of breath.  Was sent from urgent care.  He is on 4 L nasal cannula baseline and he still has baseline nasal cannula.  Per EMS he has a history of A-fib and heart rate has been up in the 140s for them.  He states he has been unable to lay flat at night and is short of breath even sitting up and with exertion.  States this got worse over the last few days but has not had any chest pain.  Does have a history of CHF and has been using his Lasix  without improvement in his symptoms.  Denies any   Shortness of Breath Associated symptoms: no abdominal pain, no chest pain, no cough, no ear pain, no fever, no rash, no sore throat and no vomiting        Prior to Admission medications   Medication Sig Start Date End Date Taking? Authorizing Provider  Accu-Chek Softclix Lancets lancets Use to test blood sugar daily 06/10/22   [name], NP  aspirin  EC 81 MG tablet Take 81 mg by mouth daily.    [provider]  atorvastatin  (LIPITOR) 40 MG tablet TAKE 1 TABLET EVERY DAY 10/06/23   [name], Jessica K, NP  brimonidine (ALPHAGAN) 0.2 % ophthalmic solution Place 1 drop into the left eye 2 (two) times daily. 12/07/23     calcium  carbonate (TUMS - DOSED IN MG ELEMENTAL CALCIUM ) 500 MG chewable tablet Chew 2 tablets by mouth as needed for indigestion or heartburn.    [provider]  glucose blood (ACCU-CHEK AVIVA PLUS) test strip Use to test blood sugar daily. 08/16/23   [name], Jessica K, NP  hydrALAZINE  (APRESOLINE ) 25 MG tablet TAKE 3 TABLETS THREE TIMES DAILY Patient taking differently: Taking 75 Mg 3 times daily 09/22/23   [name], NP  isosorbide  mononitrate (IMDUR ) 30 MG 24 hr tablet Take 1 tablet  (30 mg total) by mouth daily. 10/04/23   [name], NP  losartan  (COZAAR ) 50 MG tablet Take 0.5 tablets (25 mg total) by mouth daily. 08/23/23   [name], Jessica K, NP  metFORMIN  (GLUCOPHAGE ) 500 MG tablet TAKE 1 TABLET TWICE DAILY WITH MEALS 12/06/23   [name], Jessica K, NP  metoprolol  tartrate (LOPRESSOR ) 100 MG tablet Take 1 tablet (100 mg total) by mouth 2 (two) times daily. 07/20/23 12/25/23  Meng, Hao, PA  Multiple Vitamin (MULTIVITAMIN ADULT PO) Take 2 each by mouth daily as needed (immune system).    [provider]  potassium chloride  SA (KLOR-CON  M) 20 MEQ tablet TAKE 1 TABLET TWICE DAILY 01/07/23   [name], Jessica K, NP  rivaroxaban  (XARELTO ) 20 MG TABS tablet Take 1 tablet (20 mg total) by mouth daily with supper. 07/06/23   Pokhrel, Laxman, MD  Senna 8.7 MG CHEW Chew 1 tablet by mouth as needed. 05/20/21   [name], Jessica K, NP  spironolactone  (ALDACTONE ) 25 MG tablet Take 1 tablet (25 mg total) by mouth daily. 10/27/23   Chandrasekhar, Stanly LABOR, MD  tirzepatide  (MOUNJARO ) 15 MG/0.5ML Pen Inject 15 mg into the skin once a week. Patient taking differently: Inject 15 mg into the skin once a week. Taking 20 mg 07/13/23   Santo Stanly LABOR, MD  torsemide (DEMADEX) 20 MG tablet Take 1 tablet (20 mg) by mouth in the morning and 1 tablet (20 mg) in the early afternoon. 11/25/23   Campbell, Kenzie E, NP    Allergies: Lisinopril     Review of Systems  Constitutional:  Negative for chills and fever.  HENT:  Negative for ear pain and sore throat.   Eyes:  Negative for pain and visual disturbance.  Respiratory:  Positive for shortness of breath. Negative for cough.   Cardiovascular:  Positive for leg swelling. Negative for chest pain and palpitations.  Gastrointestinal:  Negative for abdominal pain and vomiting.  Genitourinary:  Negative for dysuria and hematuria.  Musculoskeletal:  Negative for arthralgias and back pain.  Skin:  Negative for color change and rash.   Neurological:  Negative for seizures and syncope.  All other systems reviewed and are negative.   Updated Vital Signs BP (!) 109/95   Pulse (!) 115   Temp (!) 97.4 F (36.3 C) (Oral)   Resp 20   Ht 5' 9 (1.753 m)   Wt (!) 140.2 kg   SpO2 100%   BMI 45.63 kg/m   Physical Exam Vitals and nursing note reviewed.  Constitutional:      General: He is not in acute distress.    Appearance: He is well-developed. He is obese. He is ill-appearing.  HENT:     Head: Normocephalic and atraumatic.  Eyes:     Conjunctiva/sclera: Conjunctivae normal.  Cardiovascular:     Rate and Rhythm: Tachycardia present. Rhythm irregular.     Heart sounds: No murmur heard. Pulmonary:     Effort: Pulmonary effort is normal. No respiratory distress.     Breath sounds: Decreased breath sounds and rhonchi present.  Abdominal:     Palpations: Abdomen is soft.     Tenderness: There is no abdominal tenderness.  Musculoskeletal:        General: No swelling.     Cervical back: Neck supple.     Right lower leg: Edema present.     Left lower leg: Edema present.  Skin:    General: Skin is warm and dry.     Capillary Refill: Capillary refill takes less than 2 seconds.  Neurological:     Mental Status: He is alert.  Psychiatric:        Mood and Affect: Mood normal.     (all labs ordered are listed, but only abnormal results are displayed) Labs Reviewed  BASIC METABOLIC PANEL WITH GFR - Abnormal; Notable for the following components:      Result Value   Glucose, Bld 181 (*)    BUN 44 (*)    Creatinine, Ser 2.21 (*)    Calcium  8.8 (*)    GFR, Estimated 31 (*)    All other components within normal limits  CBC - Abnormal; Notable for the following components:   RBC 4.17 (*)    Hemoglobin 12.0 (*)    HCT 38.9 (*)    RDW 15.9 (*)    All other components within normal limits  BRAIN NATRIURETIC PEPTIDE - Abnormal; Notable for the following components:   B Natriuretic Peptide 809.1 (*)    All other  components within normal limits  TROPONIN I (HIGH SENSITIVITY)    EKG: EKG Interpretation Date/Time:  Monday December 13 2023 12:20:11 EDT Ventricular Rate:  68 PR Interval:  176 QRS Duration:  100 QT Interval:  434 QTC Calculation: 461 R Axis:   -1  Text Interpretation: A fib with  occasional PVCs Cannot rule out Anterior infarct , age undetermined ST & T wave abnormality, consider inferior ischemia Compared with prior EKG from 11/25/2023 Confirmed by Gennaro Bouchard ([id number]) on 12/13/2023 12:30:51 PM  Radiology: DG Chest 2 View Result Date: 12/13/2023 CLINICAL DATA:  Worsening shortness of breath for 3-4 days. EXAM: CHEST - 2 VIEW COMPARISON:  Radiographs 08/03/2023 and 08/02/2023. Chest CT 06/11/2022. FINDINGS: There is chronic elevation of the left hemidiaphragm with associated left basilar atelectasis or scarring. Overall left base aeration appears improved compared with the previous examination. The lungs are otherwise clear. There is no pleural effusion or pneumothorax. The heart size and mediastinal contours are stable. Degenerative changes in the spine without evidence of acute osseous abnormality. IMPRESSION: Chronic elevation of the left hemidiaphragm with associated left basilar atelectasis or scarring. No acute cardiopulmonary process. Electronically Signed   By: Elsie Perone M.D.   On: 12/13/2023 12:50     Procedures   Medications Ordered in the ED  furosemide  (LASIX ) injection 80 mg (has no administration in time range)  diltiazem  (CARDIZEM ) 125 mg in dextrose  5% 125 mL (1 mg/mL) infusion (has no administration in time range)  aspirin  EC tablet 81 mg (has no administration in time range)  atorvastatin  (LIPITOR) tablet 40 mg (has no administration in time range)  brimonidine (ALPHAGAN) 0.2 % ophthalmic solution 1 drop (has no administration in time range)  isosorbide  mononitrate (IMDUR ) 24 hr tablet 30 mg (has no administration in time range)  losartan  (COZAAR ) tablet 25  mg (has no administration in time range)  metoprolol  tartrate (LOPRESSOR ) tablet 100 mg (has no administration in time range)  rivaroxaban  (XARELTO ) tablet 20 mg (has no administration in time range)  spironolactone  (ALDACTONE ) tablet 25 mg (has no administration in time range)  furosemide  (LASIX ) injection 80 mg (has no administration in time range)                                    Medical Decision Making Cardiac monitor interpretation: A-fib with rapid ventricular response  Patient here for shortness of breath likely acute on chronic CHF.  Will give Lasix  here.  Workup largely negative except for BNP of 800.  He does have significant swelling of his legs and is tachypneic with some rhonchi.  He was in A-fib with RVR in the 140s which resolved on his own to 70s but then back up to 120s.  Will start him on a Cardizem  drip.  Discussed patient case with Dr. Georgina, hospitalist and patient be admitted for further workup and management.  Patient is agreeable to plan.  Problems Addressed: Acute on chronic congestive heart failure, unspecified heart failure type [facility]): acute illness or injury that poses a threat to life or bodily functions Atrial fibrillation with rapid ventricular response (HCC): acute illness or injury that poses a threat to life or bodily functions  Amount and/or Complexity of Data Reviewed External Data Reviewed: notes.    Details: Prior ED and urgent care records reviewed and patient has been here in the past for CHF exacerbation, has a history of atrial fibrillation Labs: ordered. Decision-making details documented in ED Course.    Details: Ordered and reviewed by me and troponin is pending but other labs unremarkable except for elevated BNP Radiology: ordered and independent interpretation performed. Decision-making details documented in ED Course.    Details: Ordered and inter by me independently radiology  Chest x-ray: Shows no acute  abnormality in the chest, just some  atelectasis ECG/medicine tests: ordered and independent interpretation performed. Decision-making details documented in ED Course.    Details: Ordered and interpreted me in the absence of cardiology and shows A-fib with RVR, nonspecific ST changes Discussion of management or test interpretation with external provider(s): Dr. Bernadene spoke with him on the phone regarding the patient's case and occasional be admitted for further workup and management  Risk OTC drugs. Prescription drug management. Drug therapy requiring intensive monitoring for toxicity. Decision regarding hospitalization. Risk Details: CRITICAL CARE Performed by: Duwaine LITTIE Fusi   Total critical care time: 30 minutes  Critical care time was exclusive of separately billable procedures and treating other patients.  Critical care was necessary to treat or prevent imminent or life-threatening deterioration.  Critical care was time spent personally by me on the following activities: development of treatment plan with patient and/or surrogate as well as nursing, discussions with consultants, evaluation of patient's response to treatment, examination of patient, obtaining history from patient or surrogate, ordering and performing treatments and interventions, ordering and review of laboratory studies, ordering and review of radiographic studies, pulse oximetry and re-evaluation of patient's condition.   Critical Care Total time providing critical care: 30 minutes     Final diagnoses:  Acute on chronic congestive heart failure, unspecified heart failure type [facility])  Atrial fibrillation with rapid ventricular response [facility])    ED Discharge Orders     None          Fusi Duwaine LITTIE, DO 12/13/23 1328

## 2023-12-13 NOTE — Patient Instructions (Addendum)
 Visit Information  Thank you for taking time to visit with me today. Please don't hesitate to contact me if I can be of assistance to you before our next scheduled appointment.  Call EMS to transport you to the hospital as soon as possible for evaluation of CHF   Follow Up Plan:  Telephone follow-up in 1 week   Please call the care guide team at [phone] if you need to cancel, schedule, or reschedule an appointment.   Please call 1-800-273-TALK (toll free, 24 hour hotline) if you are experiencing a Mental Health or Behavioral Health Crisis or need someone to talk to.  Clayborne Ly RN BSN CCM [place]  [facility], [facility] Health Nurse Care Coordinator  Direct Dial: [phone] Website: [email or web address]

## 2023-12-13 NOTE — ED Triage Notes (Signed)
 Pt to ED via [facility] from home. Pt c/o SHOB x 3-4 days, worsening. Worsens with exertion and has had to sleep in recliner. Pt called PCP and was told to come to ED. Pt states he has had swelling in feet and legs, denies pain, fever, or chills.   HR 84, 138bpm afib 98% on 4LNC (wears 4L [place] at baseline) 162/108 Cbg 177

## 2023-12-13 NOTE — ED Notes (Signed)
Phlebotomy contacted for blood work.

## 2023-12-13 NOTE — Patient Outreach (Addendum)
 Complex Care Management   Visit Note  12/13/2023  Name:  Thomas [name] MRN: [id number] DOB: [birth year]  Situation: Referral received for Complex Care Management related to CHF, Pulmonary Hypertension, OSA w/CPAP, PAF (paroxysmal atrial fibrillation), Urinary Retention with Suprapubic Catheter. I obtained verbal consent from Patient.  Visit completed with Patient on the phone.  Background:   Past Medical History:  Diagnosis Date   Acute on chronic diastolic CHF (congestive heart failure) (HCC) 06/01/2017   Arthritis    Cancer (HCC) 2010   Prostate   Chronic kidney disease    Diabetes mellitus    Diabetic neuropathy (HCC)    feet   DVT (deep venous thrombosis) (HCC)    Genetic testing 06/22/2016   [name] underwent genetic counseling and testing for hereditary cancer syndromes on 05/14/2016. His results were negative for mutations in all 46 genes analyzed by [facility]'s 46-gene Common Hereditary Cancers Panel. Genes analyzed include: APC, ATM, AXIN2, BARD1, BMPR1A, BRCA1, BRCA2, BRIP1, CDH1, CDKN2A, CHEK2, CTNNA1, DICER1, EPCAM, GREM1, HOXB13, KIT, MEN1, MLH1, MSH2, MSH3, MSH6, MUTYH, NB   GERD (gastroesophageal reflux disease)    Hypertension    PE (pulmonary thromboembolism) (HCC)    Pneumonia    Sleep apnea    not wearing CPAP   Suprapubic catheter (HCC) 11/17/2019    Assessment: Patient Reported Symptoms:  Cognitive Cognitive Status: Alert and oriented to person, place, and time, Normal speech and language skills Cognitive/Intellectual Conditions Management [RPT]: None reported or documented in medical history or problem list      Neurological Neurological Review of Symptoms: Not assessed    HEENT HEENT Symptoms Reported: Not assessed      Cardiovascular Cardiovascular Symptoms Reported: Swelling in legs or feet Does patient have uncontrolled Hypertension?: No Cardiovascular Management Strategies: Adequate rest, Routine screening, Medication  therapy Cardiovascular Self-Management Outcome: 1 (very bad) Cardiovascular Comment: patient instructed to go to the ED for evaluation of CHF  Respiratory Respiratory Symptoms Reported: Shortness of breath Respiratory Management Strategies: Adequate rest, Oxygen  therapy, Routine screening, Medication therapy, CPAP Respiratory Self-Management Outcome: 1 (very bad)  Endocrine Endocrine Symptoms Reported: Not assessed    Gastrointestinal Gastrointestinal Symptoms Reported: Not assessed      Genitourinary Genitourinary Symptoms Reported: Not assessed    Integumentary Integumentary Symptoms Reported: Not assessed    Musculoskeletal Musculoskelatal Symptoms Reviewed: Difficulty walking, Limited mobility Musculoskeletal Management Strategies: Adequate rest, Medical device Musculoskeletal Self-Management Outcome: 2 (bad)      Psychosocial Psychosocial Symptoms Reported: Alteration in sleep habits Additional Psychological Details: patient is having to sleep sitting up Behavioral Management Strategies: Adequate rest, Support system Behavioral Health Self-Management Outcome: 2 (bad) Major Change/Loss/Stressor/Fears (CP): Medical condition, self Quality of Family Relationships: involved, supportive, helpful    12/13/2023    PHQ2-9 Depression Screening   Lacreshia Bondarenko interest or pleasure in doing things    Feeling down, depressed, or hopeless    PHQ-2 - Total Score    Trouble falling or staying asleep, or sleeping too much    Feeling tired or having Kermitt Harjo energy    Poor appetite or overeating     Feeling bad about yourself - or that you are a failure or have let yourself or your family down    Trouble concentrating on things, such as reading the newspaper or watching television    Moving or speaking so slowly that other people could have noticed.  Or the opposite - being so fidgety or restless that you have been moving around a lot more than usual  Thoughts that you would be better off dead,  or hurting yourself in some way    PHQ2-9 Total Score    If you checked off any problems, how difficult have these problems made it for you to do your work, take care of things at home, or get along with other people    Depression Interventions/Treatment      There were no vitals filed for this visit.  Medications Reviewed Today     Reviewed by Morgan Clayborne CROME, RN (Registered Nurse) on 12/13/23 at [phone]  Med List Status: <None>   Medication Order Taking? Sig Documenting Provider Last Dose Status Informant  Accu-Chek Softclix Lancets lancets [id number]  Use to test blood sugar daily [name], NP  Active Self, Pharmacy Records  Alcohol  Swabs (B-D SINGLE USE SWABS REGULAR) PADS [id number]  Use in testing blood sugar daily. Dx: E11.22 [name], NP  Active Self, Pharmacy Records  aspirin  EC 81 MG tablet [id number]  Take 81 mg by mouth daily. [provider]  Active Self, Pharmacy Records  atorvastatin  (LIPITOR) 40 MG tablet [id number]  TAKE 1 TABLET EVERY DAY [name], Jessica K, NP  Active   Blood Glucose Calibration (ACCU-CHEK AVIVA) SOLN [id number]  Use once daily as directed dx E11.22 [name], Jessica K, NP  Active Self, Pharmacy Records  brimonidine (ALPHAGAN) 0.2 % ophthalmic solution [id number]  Place 1 drop into the left eye 2 (two) times daily.   Active   calcium  carbonate (TUMS - DOSED IN MG ELEMENTAL CALCIUM ) 500 MG chewable tablet [id number]  Chew 2 tablets by mouth as needed for indigestion or heartburn. [provider]  Active Self, Pharmacy Records  glucose blood (ACCU-CHEK AVIVA PLUS) test strip [id number]  Use to test blood sugar daily. [name], NP  Active   hydrALAZINE  (APRESOLINE ) 25 MG tablet [id number]  TAKE 3 TABLETS THREE TIMES DAILY  Patient taking differently: Taking 75 Mg 3 times daily   [name], Jessica K, NP  Active   isosorbide  mononitrate (IMDUR ) 30 MG 24 hr tablet [id number]  Take 1 tablet (30 mg total) by mouth daily. [name], NP  Active   Lancets Misc. (ACCU-CHEK SOFTCLIX LANCET DEV) KIT [id number]  Use to test blood sugar daily [name], NP  Active Self, Pharmacy Records  losartan  (COZAAR ) 50 MG tablet [id number]  Take 0.5 tablets (25 mg total) by mouth daily. [name], NP  Active   metFORMIN  (GLUCOPHAGE ) 500 MG tablet [id number]  TAKE 1 TABLET TWICE DAILY WITH MEALS Caro, Jessica K, NP  Active   metoprolol  tartrate (LOPRESSOR ) 100 MG tablet [id number]  Take 1 tablet (100 mg total) by mouth 2 (two) times daily. Meng, Hao, [place]  Active   Multiple Vitamin (MULTIVITAMIN ADULT PO) [id number]  Take 2 each by mouth daily as needed (immune system). [provider]  Active Self, Pharmacy Records  potassium chloride  SA (KLOR-CON  M) 20 MEQ tablet [id number]  TAKE 1 TABLET TWICE DAILY [name], Jessica K, NP  Active Self, Pharmacy Records           Med Note (S[place]TER[place], TEENA BRAVO   Tue Aug 03, 2023  5:39 PM)    rivaroxaban  (XARELTO ) 20 MG TABS tablet [id number]  Take 1 tablet (20 mg total) by mouth daily with supper. Sonjia Held, MD  Active            Med Note ([place], TEENA BRAVO   Tue Aug 03, 2023  5:39 PM)    Senna 8.7  MG CHEW [id number]  Chew 1 tablet by mouth as needed. [name], NP  Active Self, Pharmacy Records  spironolactone  (ALDACTONE ) 25 MG tablet [id number]  Take 1 tablet (25 mg total) by mouth daily. Santo Stanly LABOR, MD  Active   tirzepatide  (MOUNJARO ) 15 MG/0.5ML Pen [id number]  Inject 15 mg into the skin once a week.  Patient taking differently: Inject 15 mg into the skin once a week. Taking 20 mg   Chandrasekhar, Mahesh A, MD  Active            Med Note (S[place]TER[place], TEENA BRAVO   Tue Aug 03, 2023  5:36 PM) Take on sundays  torsemide (DEMADEX) 20 MG tablet [id number]  Take 1 tablet (20 mg) by mouth in the morning and 1 tablet (20 mg) in the early afternoon. Campbell, Kenzie E, NP  Active             Recommendation:   Call EMS to transport you to the  hospital as soon as possible for evaluation of CHF   Follow Up Plan:   Telephone follow-up in 1 week   Clayborne Ly RN BSN CCM [place]  [facility], [facility] Health Nurse Care Coordinator  Direct Dial: [phone] Website: [email or web address]

## 2023-12-13 NOTE — H&P (Addendum)
 History and Physical    Patient: Thomas [name] DOB: 04/30/1950 DOA: 12/13/2023 DOS: the patient was seen and examined on 12/13/2023 PCP: [name], NP  Patient coming from: Home  Chief Complaint:  Chief Complaint  Patient presents with   Shortness of Breath   HPI: Thomas [name] is a 72 y.o. male with medical history significant of chronic respiratory failure on 3-4L home O2, OSA, OHS, pulmonary hypertension, DVT/PE on Xarelto , HFmrEF (EF 40-45% in 06/2023), paroxysmal A-fib, type 2 diabetes mellitus, prostate cancer, urinary retention, chronic suprapubic catheter, and last admitted to [facility] in 07/2023 for Klebsiella UTI and ADHF (diuresed 19 lbs) who p/w SOB/DOE, BLE edema, and worsening 2 pillow orthopnea c/w HFmrEF exacerbation.  The patient reported that breathing difficulties worsened around October 16th, which was approximately eleven days prior to this visit. Initially, the patient experienced issues with breathing while lying flat and has since been unable to sleep in bed, opting instead to sleep in a recliner. This change was due to the sensation that the patient could inhale but was unable to exhale fully while lying flat. The patient used a CPAP machine with oxygen  but could not continue its use since the onset of these worsened symptoms. The patient experienced shortness of breath both at night and during the day, which had been ongoing for months. The patient believed that fluid retention due to high salt intake might be exacerbating the symptoms. The patient had stopped consuming Gatorade, which was high in sodium, more than a week ago.   In the ED, tachycardic and tachypneic on 4L [place], which is baseline. Labs notable for Cr 2.21 (baseline 1.9 on 10/9), and BNP 809. EDP started IV lasix  80mg  x1 and requested medicine admission.   Review of Systems: As mentioned in the history of present illness. All other systems reviewed and are negative. Past  Medical History:  Diagnosis Date   Acute on chronic diastolic CHF (congestive heart failure) (HCC) 06/01/2017   Arthritis    Cancer (HCC) 2010   Prostate   Chronic kidney disease    Diabetes mellitus    Diabetic neuropathy (HCC)    feet   DVT (deep venous thrombosis) (HCC)    Genetic testing 06/22/2016   Mr. Armenti underwent genetic counseling and testing for hereditary cancer syndromes on 05/14/2016. His results were negative for mutations in all 46 genes analyzed by [facility]'s 46-gene Common Hereditary Cancers Panel. Genes analyzed include: APC, ATM, AXIN2, BARD1, BMPR1A, BRCA1, BRCA2, BRIP1, CDH1, CDKN2A, CHEK2, CTNNA1, DICER1, EPCAM, GREM1, HOXB13, KIT, MEN1, MLH1, MSH2, MSH3, MSH6, MUTYH, NB   GERD (gastroesophageal reflux disease)    Hypertension    PE (pulmonary thromboembolism) (HCC)    Pneumonia    Sleep apnea    not wearing CPAP   Suprapubic catheter (HCC) 11/17/2019   Past Surgical History:  Procedure Laterality Date   CARDIOVERSION N/A 06/03/2017   Procedure: CARDIOVERSION;  Surgeon: Jeffrie Oneil BROCKS, MD;  Location: [facility];  Service: Cardiovascular;  Laterality: N/A;   CARDIOVERSION N/A 07/27/2023   Procedure: CARDIOVERSION;  Surgeon: Delford Maude BROCKS, MD;  Location: [facility] INVASIVE CV LAB;  Service: Cardiovascular;  Laterality: N/A;   CARDIOVERSION N/A 08/16/2023   Procedure: CARDIOVERSION;  Surgeon: Santo Stanly LABOR, MD;  Location: [facility] INVASIVE CV LAB;  Service: Cardiovascular;  Laterality: N/A;   CATARACT EXTRACTION Left 07/31/2021   Dr.Glenn, [facility]   COLONOSCOPY     COLONOSCOPY WITH PROPOFOL  N/A 10/20/2018   Procedure: COLONOSCOPY WITH  PROPOFOL ;  Surgeon: Eda Iha, MD;  Location: [facility];  Service: Gastroenterology;  Laterality: N/A;   HERNIA REPAIR     KNEE SURGERY     MULTIPLE TOOTH EXTRACTIONS     POLYPECTOMY  10/20/2018   Procedure: POLYPECTOMY;  Surgeon: Eda Iha, MD;  Location: [facility];  Service:  Gastroenterology;;   PROSTATE SURGERY     RADIOACTIVE SEED IMPLANT     RIGHT/LEFT HEART CATH AND CORONARY ANGIOGRAPHY N/A 07/06/2017   Procedure: RIGHT/LEFT HEART CATH AND CORONARY ANGIOGRAPHY;  Surgeon: Dann Candyce RAMAN, MD;  Location: [facility] INVASIVE CV LAB;  Service: Cardiovascular;  Laterality: N/A;   SHOULDER SURGERY     TOTAL KNEE ARTHROPLASTY Right 11/19/2016   Procedure: RIGHT TOTAL KNEE ARTHROPLASTY;  Surgeon: Jerri Kay HERO, MD;  Location: [facility] OR;  Service: Orthopedics;  Laterality: Right;   uretha surgery-2014     Social History:  reports that he has never smoked. He has never been exposed to tobacco smoke. He has never used smokeless tobacco. He reports that he does not drink alcohol  and does not use drugs.  Allergies  Allergen Reactions   Lisinopril  Cough and Other (See Comments)    Muscle pain    Family History  Problem Relation Age of Onset   Breast cancer Mother 39       d.89   Breast cancer Sister 30       d.30   Leukemia Brother 18       d.20   Breast cancer Maternal Aunt 40       d.40s   Lung cancer Maternal Uncle    Prostate cancer Paternal Uncle    Prostate cancer Brother        recurred recently at age 90   Other Brother 15       spinal tumor   Cervical cancer Other 22       d.22   Cancer Sister 63       unspecified type   Cancer Maternal Uncle 80       unspecified type    Prior to Admission medications   Medication Sig Start Date End Date Taking? Authorizing Provider  Accu-Chek Softclix Lancets lancets Use to test blood sugar daily 06/10/22   [name], NP  Alcohol  Swabs (B-D SINGLE USE SWABS REGULAR) PADS Use in testing blood sugar daily. Dx: E11.22 05/20/21   [name], NP  aspirin  EC 81 MG tablet Take 81 mg by mouth daily.    [provider]  atorvastatin  (LIPITOR) 40 MG tablet TAKE 1 TABLET EVERY DAY 10/06/23   [name], Jessica K, NP  Blood Glucose Calibration (ACCU-CHEK AVIVA) SOLN Use once daily as directed dx E11.22  08/25/19   [name], Jessica K, NP  brimonidine (ALPHAGAN) 0.2 % ophthalmic solution Place 1 drop into the left eye 2 (two) times daily. 12/07/23     calcium  carbonate (TUMS - DOSED IN MG ELEMENTAL CALCIUM ) 500 MG chewable tablet Chew 2 tablets by mouth as needed for indigestion or heartburn.    [provider]  glucose blood (ACCU-CHEK AVIVA PLUS) test strip Use to test blood sugar daily. 08/16/23   [name], Jessica K, NP  hydrALAZINE  (APRESOLINE ) 25 MG tablet TAKE 3 TABLETS THREE TIMES DAILY Patient taking differently: Taking 75 Mg 3 times daily 09/22/23   [name], Jessica K, NP  isosorbide  mononitrate (IMDUR ) 30 MG 24 hr tablet Take 1 tablet (30 mg total) by mouth daily. 10/04/23   [name], NP  Lancets Misc. (  ACCU-CHEK SOFTCLIX LANCET DEV) KIT Use to test blood sugar daily 03/18/21   [name], Jessica K, NP  losartan  (COZAAR ) 50 MG tablet Take 0.5 tablets (25 mg total) by mouth daily. 08/23/23   [name], Jessica K, NP  metFORMIN  (GLUCOPHAGE ) 500 MG tablet TAKE 1 TABLET TWICE DAILY WITH MEALS 12/06/23   [name], Jessica K, NP  metoprolol  tartrate (LOPRESSOR ) 100 MG tablet Take 1 tablet (100 mg total) by mouth 2 (two) times daily. 07/20/23 12/25/23  Meng, Hao, PA  Multiple Vitamin (MULTIVITAMIN ADULT PO) Take 2 each by mouth daily as needed (immune system).    [provider]  potassium chloride  SA (KLOR-CON  M) 20 MEQ tablet TAKE 1 TABLET TWICE DAILY 01/07/23   [name], Jessica K, NP  rivaroxaban  (XARELTO ) 20 MG TABS tablet Take 1 tablet (20 mg total) by mouth daily with supper. 07/06/23   Pokhrel, Laxman, MD  Senna 8.7 MG CHEW Chew 1 tablet by mouth as needed. 05/20/21   [name], NP  spironolactone  (ALDACTONE ) 25 MG tablet Take 1 tablet (25 mg total) by mouth daily. 10/27/23   Chandrasekhar, Stanly LABOR, MD  tirzepatide  (MOUNJARO ) 15 MG/0.5ML Pen Inject 15 mg into the skin once a week. Patient taking differently: Inject 15 mg into the skin once a week. Taking 20 mg 07/13/23    Santo Stanly A, MD  torsemide (DEMADEX) 20 MG tablet Take 1 tablet (20 mg) by mouth in the morning and 1 tablet (20 mg) in the early afternoon. 11/25/23   Elaine Miriam BRAVO, NP    Physical Exam: Vitals:   12/13/23 1135 12/13/23 1136 12/13/23 1308 12/13/23 1311  BP: (!) 138/101  (!) 109/95   Pulse: (!) 138  (!) 115   Resp: (!) 22  20   Temp: (!) 94.2 F (34.6 C)   (!) 97.4 F (36.3 C)  TempSrc: Oral   Oral  SpO2: 97%  100%   Weight:  (!) 140.2 kg    Height:  5' 9 (1.753 m)     General: Alert, oriented x3, resting comfortably in no acute distress HEENT: EOMI, oropharynx clear, moist mucous membranes, hearing intact Neck: Trachea midline and no gross thyromegaly Respiratory: Lungs clear to auscultation bilaterally with normal respiratory effort; no w/r/r Cardiovascular: Regular rate and rhythm w/o m/r/g Abdomen: Soft, nontender, nondistended. Positive bowel sounds MSK: No obvious joint deformities or swelling Skin: No obvious rashes or lesions Neurologic: Awake, alert, spontaneously moves all extremities, strength intact Psychiatric: Appropriate mood and affect, conversational and cooperative   Data Reviewed:  Lab Results  Component Value Date   WBC 7.7 12/13/2023   HGB 12.0 (L) 12/13/2023   HCT 38.9 (L) 12/13/2023   MCV 93.3 12/13/2023   PLT 263 12/13/2023   Lab Results  Component Value Date   GLUCOSE 181 (H) 12/13/2023   CALCIUM  8.8 (L) 12/13/2023   NA 144 12/13/2023   K 3.8 12/13/2023   CO2 26 12/13/2023   CL 104 12/13/2023   BUN 44 (H) 12/13/2023   CREATININE 2.21 (H) 12/13/2023   Lab Results  Component Value Date   ALT 38 08/03/2023   AST 25 08/03/2023   ALKPHOS 56 08/03/2023   BILITOT 1.7 (H) 08/03/2023   Lab Results  Component Value Date   INR 1.1 07/03/2023   INR 1.05 11/19/2016   INR 1.40 12/04/2015   Radiology: DG Chest 2 View Result Date: 12/13/2023 CLINICAL DATA:  Worsening shortness of breath for 3-4 days. EXAM: CHEST - 2 VIEW  COMPARISON:  Radiographs  08/03/2023 and 08/02/2023. Chest CT 06/11/2022. FINDINGS: There is chronic elevation of the left hemidiaphragm with associated left basilar atelectasis or scarring. Overall left base aeration appears improved compared with the previous examination. The lungs are otherwise clear. There is no pleural effusion or pneumothorax. The heart size and mediastinal contours are stable. Degenerative changes in the spine without evidence of acute osseous abnormality. IMPRESSION: Chronic elevation of the left hemidiaphragm with associated left basilar atelectasis or scarring. No acute cardiopulmonary process. Electronically Signed   By: Elsie Perone M.D.   On: 12/13/2023 12:50    Assessment and Plan: 52M h/o chronic respiratory failure on 3-4L home O2, OSA, OHS, pulmonary hypertension, DVT/PE on Xarelto , HFmrEF (EF 40-45% in 06/2023), paroxysmal A-fib, type 2 diabetes mellitus, prostate cancer, urinary retention, chronic suprapubic catheter, and last admitted to [facility] in 07/2023 for Klebsiella UTI and ADHF (diuresed 19 lbs) who p/w SOB/DOE, BLE edema, and worsening 2 pillow orthopnea c/w HFmrEF exacerbation.  HFmrEF exacerbation Elevated BNP -IV lasix  80mg  BID for now; goal net neg 1-2L/d; strict I/Os; daily standing weights; K>4/Mg>2 -PTA ASA 81mg  daily, atorvastatin  40mg  daily, metoprolol  100mg  BID, losartan  25mg  daily, and spironolactone  25mg  daily  Paroxysmal Afib -PTA Xarelto  20mg  daily and metoprolol  100mg  BID  HTN -PTA Imdur  30mg  daily -HOLD pta hydralazine  75mg  TID for now   Advance Care Planning:   Code Status: Prior   Consults: N/A  Family Communication: Wife  Severity of Illness: The appropriate patient status for this patient is INPATIENT. Inpatient status is judged to be reasonable and necessary in order to provide the required intensity of service to ensure the patient's safety. The patient's presenting symptoms, physical exam findings, and initial radiographic and  laboratory data in the context of their chronic comorbidities is felt to place them at high risk for further clinical deterioration. Furthermore, it is not anticipated that the patient will be medically stable for discharge from the hospital within 2 midnights of admission.   * I certify that at the point of admission it is my clinical judgment that the patient will require inpatient hospital care spanning beyond 2 midnights from the point of admission due to high intensity of service, high risk for further deterioration and high frequency of surveillance required.*   ------- I spent 55 minutes reviewing previous notes, at the bedside counseling/discussing the treatment plan, and performing clinical documentation.  Author: Marsha Ada, MD 12/13/2023 1:17 PM  For on call review [email or web address].

## 2023-12-13 NOTE — ED Notes (Signed)
 Messaged Dr. Georgina to let him know pt HR is bouncing around from the 50's to 130's.

## 2023-12-13 NOTE — ED Notes (Signed)
 CCMD called by this RN

## 2023-12-13 NOTE — Telephone Encounter (Signed)
Patient is currently in the Hospital.

## 2023-12-13 NOTE — Telephone Encounter (Signed)
 Son has been assisting with coordinating appointments and taking patient to appointments. He would like to know if he can have paperwork completed for intermittent FMLA. Please advise.

## 2023-12-14 DIAGNOSIS — I509 Heart failure, unspecified: Secondary | ICD-10-CM | POA: Diagnosis not present

## 2023-12-14 LAB — BASIC METABOLIC PANEL WITH GFR
Anion gap: 14 (ref 5–15)
BUN: 43 mg/dL — ABNORMAL HIGH (ref 8–23)
CO2: 26 mmol/L (ref 22–32)
Calcium: 8.6 mg/dL — ABNORMAL LOW (ref 8.9–10.3)
Chloride: 102 mmol/L (ref 98–111)
Creatinine, Ser: 2.1 mg/dL — ABNORMAL HIGH (ref 0.61–1.24)
GFR, Estimated: 33 mL/min — ABNORMAL LOW (ref >60)
Glucose, Bld: 136 mg/dL — ABNORMAL HIGH (ref 70–99)
Potassium: 3.2 mmol/L — ABNORMAL LOW (ref 3.5–5.1)
Sodium: 142 mmol/L (ref 135–145)

## 2023-12-14 LAB — GLUCOSE, CAPILLARY: Glucose-Capillary: 119 mg/dL — ABNORMAL HIGH (ref 70–99)

## 2023-12-14 NOTE — Progress Notes (Addendum)
 PROGRESS NOTE  Thomas [name]  DOB: September 29, 1950  PCP: [name], NP FMW:[id number]  DOA: 12/13/2023  LOS: 1 day  Hospital Day: 2  Subjective: Patient was seen and examined this afternoon. Please elderly African-American male resting up in chair.  Not in distress.  Wife at bedside.  Brief narrative: Thomas [name] is a 73 y.o. 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  He has been sleeping on a recliner.  Reports inability to use CPAP at home.  In the ED, patient was tachycardic and tachypneic , on 4 his oxygen  which is his home requirement Labs with creatinine 2.21, baseline 1.9 from 10/9, BNP 829 Chest x-ray with basilar atelectasis Given IV Lasix  80 mg Admitted to [facility]  Assessment and plan: Acute exacerbation of CHF H/o pulm hypertension HTN Presented with volume overload symptoms, elevated BNP Most recent echo from May 2025 with EF 40 to 45% PTA meds- metoprolol  100 mg twice daily, hydralazine  75 mg 3 times daily, Imdur  30 mg daily, losartan  25 mg daily, torsemide 20 mg twice daily, Aldactone  25 mg daily Has been started on IV Lasix  80 mg twice daily. Also continue metoprolol  and Imdur .  Keep hydralazine , losartan  and Aldactone  on hold for now. Repeat echo  Continue to monitor for daily intake output, weight, blood pressure, BNP, renal function and electrolytes. Net IO Since Admission: -1,503.66 mL [12/14/23 1559] Recent Labs  Lab 12/13/23 1149 12/14/23 0236  BNP 809.1*  --    BUN 44* 43*  CREATININE 2.21* 2.10*  NA 144 142  K 3.8 3.2*   Paroxysmal A-fib Continue metoprolol  100 mg twice daily Chronically anticoagulated with Xarelto  20 g daily  Type 2 diabetes mellitus A1c 5.7 on 08/19/2023 PTA meds-none Continue SSI/Accu-Cheks Recent Labs  Lab 12/14/23 0621  GLUCAP 119*   HLD PTA ASA 81mg  daily, atorvastatin  40mg  daily Continue both  Chronic respiratory failure  OSA/OHS on 3-4L home O2  DVT/PE Chronically anticoagulated on Xarelto   H/o prostate cancer Chronic urinary retention s/p chronic suprapubic catheter Patient follows up with urology at [facility] and gets suprapubic catheter changed monthly.  He states he was supposed to get it changed today but ended up in the hospital and is asking to have it changed.  Morbid Obesity  Body mass index is 44.73 kg/m. Patient has been advised to make an attempt to improve diet and exercise patterns to aid in weight loss.    Mobility: Wheelchair-bound mostly PT Orders:   PT Follow up Rec:    Goals of care   Code Status: Full Code     DVT prophylaxis:   rivaroxaban  (XARELTO ) tablet 20 mg   Antimicrobials: None Fluid: None Consultants: None Family Communication: Wife at bedside  Status: Inpatient Level of care:  Telemetry Cardiac   Patient is from: Home Needs to continue in-hospital care: On IV diuresis Anticipated d/c to: Pending clinical course   Diet:  Diet Order             Diet 2 gram sodium Room service appropriate? Yes; Fluid consistency: Thin  Diet effective now  Scheduled Meds:  aspirin  EC  81 mg Oral Daily   atorvastatin   40 mg Oral Daily   brimonidine  1 drop Left Eye BID   furosemide   80 mg Intravenous BID   isosorbide  mononitrate  30 mg Oral Daily   metoprolol  tartrate  100 mg Oral BID   rivaroxaban   20 mg Oral Q supper    PRN meds:    Infusions:   diltiazem  (CARDIZEM ) infusion Stopped (12/14/23 0659)     Antimicrobials: Anti-infectives (From admission, onward)    None       Objective: Vitals:   12/14/23 0532 12/14/23 0739  BP: (!) 121/99 (!) 134/92  Pulse: (!) 120 68  Resp: 19 19  Temp: 97.6 F (36.4 C) 97.9 F (36.6 C)  SpO2: 97% 95%    Intake/Output Summary (Last 24 hours) at 12/14/2023 1559 Last data filed at 12/14/2023 1400 Gross per 24 hour  Intake 896.34 ml  Output 2400 ml  Net -1503.66 ml   Filed Weights   12/13/23 1136 12/14/23 0532  Weight: (!) 140.2 kg (!) 137.4 kg   Weight change:  Body mass index is 44.73 kg/m.   Physical Exam: General exam: Pleasant, elderly African-American male, morbidly obese Skin: No rashes, lesions or ulcers. HEENT: Atraumatic, normocephalic, no obvious bleeding Lungs: Clear to auscultation bilaterally,  CVS: S1, S2, no murmur,   GI/Abd: Soft, nontender, nondistended, bowel sound present,   CNS: Alert, awake, oriented x 3 Psychiatry: Mood appropriate Extremities: Trace to 1+ bilateral pedal edema, no calf tenderness,   Data Review: I have personally reviewed the laboratory data and studies available.  F/u labs ordered Unresulted Labs (From admission, onward)     Start     Ordered   12/15/23 0500  Basic metabolic panel with GFR  Tomorrow morning,   R        12/14/23 1553   12/15/23 0500  CBC with Differential/Platelet  Tomorrow morning,   R        12/14/23 1553            Signed, Chapman Rota, MD [facility] 12/14/2023

## 2023-12-14 NOTE — Plan of Care (Signed)

## 2023-12-15 ENCOUNTER — Ambulatory Visit: Admitting: Nurse Practitioner

## 2023-12-15 ENCOUNTER — Inpatient Hospital Stay (HOSPITAL_COMMUNITY)

## 2023-12-15 DIAGNOSIS — R9431 Abnormal electrocardiogram [ECG] [EKG]: Secondary | ICD-10-CM | POA: Diagnosis not present

## 2023-12-15 DIAGNOSIS — I509 Heart failure, unspecified: Secondary | ICD-10-CM | POA: Diagnosis not present

## 2023-12-15 LAB — BASIC METABOLIC PANEL WITH GFR
Anion gap: 14 (ref 5–15)
BUN: 37 mg/dL — ABNORMAL HIGH (ref 8–23)
CO2: 26 mmol/L (ref 22–32)
Calcium: 8.9 mg/dL (ref 8.9–10.3)
Chloride: 101 mmol/L (ref 98–111)
Creatinine, Ser: 1.76 mg/dL — ABNORMAL HIGH (ref 0.61–1.24)
GFR, Estimated: 40 mL/min — ABNORMAL LOW (ref >60)
Glucose, Bld: 101 mg/dL — ABNORMAL HIGH (ref 70–99)
Potassium: 3 mmol/L — ABNORMAL LOW (ref 3.5–5.1)
Sodium: 141 mmol/L (ref 135–145)

## 2023-12-15 LAB — CBC WITH DIFFERENTIAL/PLATELET
Abs Immature Granulocytes: 0.02 K/uL (ref 0.00–0.07)
Basophils Absolute: 0.1 K/uL (ref 0.0–0.1)
Basophils Relative: 1 %
Eosinophils Absolute: 0.3 K/uL (ref 0.0–0.5)
Eosinophils Relative: 5 %
HCT: 35.7 % — ABNORMAL LOW (ref 39.0–52.0)
Hemoglobin: 11.5 g/dL — ABNORMAL LOW (ref 13.0–17.0)
Immature Granulocytes: 0 %
Lymphocytes Relative: 19 %
Lymphs Abs: 1.3 K/uL (ref 0.7–4.0)
MCH: 29.5 pg (ref 26.0–34.0)
MCHC: 32.2 g/dL (ref 30.0–36.0)
MCV: 91.5 fL (ref 80.0–100.0)
Monocytes Absolute: 0.6 K/uL (ref 0.1–1.0)
Monocytes Relative: 9 %
Neutro Abs: 4.4 K/uL (ref 1.7–7.7)
Neutrophils Relative %: 66 %
Platelets: 236 K/uL (ref 150–400)
RBC: 3.9 MIL/uL — ABNORMAL LOW (ref 4.22–5.81)
RDW: 15.7 % — ABNORMAL HIGH (ref 11.5–15.5)
WBC: 6.8 K/uL (ref 4.0–10.5)
nRBC: 0 % (ref 0.0–0.2)

## 2023-12-15 LAB — ECHOCARDIOGRAM COMPLETE
Area-P 1/2: 4.65 cm2
Calc EF: 52.2 %
Height: 69 in
S' Lateral: 3.2 cm
Single Plane A2C EF: 35.7 %
Single Plane A4C EF: 66.9 %
Weight: 4867.76 [oz_av]

## 2023-12-15 MED ORDER — PERFLUTREN LIPID MICROSPHERE
1.0000 mL | INTRAVENOUS | Status: AC | PRN
  Administered 2023-12-15: 2 mL via INTRAVENOUS

## 2023-12-15 MED ORDER — POTASSIUM CHLORIDE CRYS ER 20 MEQ PO TBCR
40.0000 meq | EXTENDED_RELEASE_TABLET | ORAL | Status: AC
  Administered 2023-12-15 (×2): 40 meq via ORAL
  Filled 2023-12-15 (×2): qty 2

## 2023-12-15 MED ORDER — FUROSEMIDE 10 MG/ML IJ SOLN
80.0000 mg | Freq: Once | INTRAMUSCULAR | Status: AC
  Administered 2023-12-15: 80 mg via INTRAVENOUS
  Filled 2023-12-15: qty 8

## 2023-12-15 NOTE — Plan of Care (Signed)
  Problem: Education: Goal: Knowledge of General Education information will improve Description: Including pain rating scale, medication(s)/side effects and non-pharmacologic comfort measures Outcome: Progressing   Problem: Clinical Measurements: Goal: Ability to maintain clinical measurements within normal limits will improve Outcome: Progressing   Problem: Clinical Measurements: Goal: Will remain free from infection Outcome: Progressing   Problem: Activity: Goal: Risk for activity intolerance will decrease Outcome: Progressing   Problem: Nutrition: Goal: Adequate nutrition will be maintained Outcome: Progressing   Problem: Safety: Goal: Ability to remain free from injury will improve Outcome: Progressing   Problem: Pain Managment: Goal: General experience of comfort will improve and/or be controlled Outcome: Progressing   Problem: Skin Integrity: Goal: Risk for impaired skin integrity will decrease Outcome: Progressing

## 2023-12-15 NOTE — Plan of Care (Signed)
   Problem: Education: Goal: Knowledge of General Education information will improve Description Including pain rating scale, medication(s)/side effects and non-pharmacologic comfort measures Outcome: Progressing

## 2023-12-15 NOTE — Progress Notes (Signed)
 PROGRESS NOTE  Thomas [name]  DOB: [birth year]  PCP: [name], NP FMW:[id number]  DOA: 12/13/2023  LOS: 2 days  Hospital Day: 3  Subjective: Patient was seen and examined this morning. Propped up in bed.  Not in distress on 4 L oxygen . Pending echo today. Afebrile, hemodynamically stable, breathing on room air Labs this morning with potassium low at 3, creatinine better at 1.76  Brief narrative: Thomas [name] is a 73 y.o. male with PMH significant for chronic respiratory failure on 3-4L home O2, OSA, OHS, pulmonary hypertension, DVT/PE on Xarelto , HFmrEF (EF 40-45% in 06/2023), paroxysmal A-fib, type 2 diabetes mellitus, prostate cancer, urinary retention, chronic suprapubic catheter Last admitted to [facility] in 07/2023 for Klebsiella UTI and ADHF (diuresed 19 lbs)   10/27, patient presented to the ED with complaint of shortness of breath, dyspnea on exertion, progressive bilateral lower EXTR edema, two-pillow orthopnea, ongoing for about 2 weeks.  He has been sleeping on a recliner.  Reports inability to use CPAP at home.  In the ED, patient was tachycardic and tachypneic , on 4 his oxygen  which is his home requirement Labs with creatinine 2.21, baseline 1.9 from 10/9, BNP 829 Chest x-ray with basilar atelectasis Given IV Lasix  80 mg Admitted to [facility]  Assessment and plan: Acute exacerbation of CHF H/o pulm hypertension HTN Presented with volume overload symptoms, elevated BNP Most recent echo from May 2025 with EF 40 to 45% PTA meds- metoprolol  100 mg twice daily, hydralazine  75 mg 3 times daily, Imdur  30 mg daily, losartan  25 mg daily, torsemide 20 mg twice daily, Aldactone  25 mg daily Currently on started on IV Lasix  80 mg twice daily.  Creatinine improving.  Continue IV Lasix  80 mg this morning.  Pending echo report. Also continued on metoprolol  and Imdur .   On hold are hydralazine , losartan  and Aldactone   Continue to monitor for daily intake output, weight,  blood pressure, BNP, renal function and electrolytes. Net IO Since Admission: -1,881.66 mL [12/15/23 1304] Recent Labs  Lab 12/13/23 1149 12/14/23 0236 12/15/23 0243  BNP 809.1*  --   --   BUN 44* 43* 37*  CREATININE 2.21* 2.10* 1.76*  NA 144 142 141  K 3.8 3.2* 3.0*   Hypokalemia Potassium low at 3 this morning.  Replacement given.  Paroxysmal A-fib Continue metoprolol  100 mg twice daily Chronically anticoagulated with Xarelto  20 g daily  Type 2 diabetes mellitus A1c 5.7 on 08/19/2023 PTA meds-none Continue SSI/Accu-Cheks Recent Labs  Lab 12/14/23 0621  GLUCAP 119*   HLD PTA ASA 81mg  daily, atorvastatin  40mg  daily Continue both  Chronic respiratory failure  OSA/OHS on 3-4L home O2  DVT/PE Chronically anticoagulated on Xarelto   H/o prostate cancer Chronic urinary retention s/p chronic suprapubic catheter Patient follows up with urology at [facility] and gets suprapubic catheter changed monthly.  He states he was supposed to get it changed but ended up in the hospital  10/28, changed by RN in the hospital   Morbid Obesity  Body mass index is 44.93 kg/m. Patient has been advised to make an attempt to improve diet and exercise patterns to aid in weight loss.    Mobility: Wheelchair-bound mostly PT Orders:   PT Follow up Rec:    Goals of care   Code Status: Full Code     DVT prophylaxis:   rivaroxaban  (XARELTO ) tablet 20 mg   Antimicrobials: None Fluid: None Consultants: None Family Communication: Wife at bedside  Status: Inpatient Level of care:  Telemetry Cardiac  Patient is from: Home Needs to continue in-hospital care: On IV diuresis Anticipated d/c to: Pending clinical course.  If improve, hopefully home tomorrow   Diet:  Diet Order             Diet 2 gram sodium Room service appropriate? Yes; Fluid consistency: Thin  Diet effective now                   Scheduled Meds:  aspirin  EC  81 mg Oral Daily   atorvastatin   40 mg Oral  Daily   brimonidine  1 drop Left Eye BID   isosorbide  mononitrate  30 mg Oral Daily   metoprolol  tartrate  100 mg Oral BID   potassium chloride   40 mEq Oral Q2H   rivaroxaban   20 mg Oral Q supper    PRN meds:    Infusions:   diltiazem  (CARDIZEM ) infusion Stopped (12/14/23 0659)    Antimicrobials: Anti-infectives (From admission, onward)    None       Objective: Vitals:   12/15/23 0418 12/15/23 0719  BP: (!) 135/103 (!) 159/97  Pulse: 68 68  Resp: 20 20  Temp: 97.9 F (36.6 C) 97.6 F (36.4 C)  SpO2: 94% 92%    Intake/Output Summary (Last 24 hours) at 12/15/2023 1304 Last data filed at 12/15/2023 0900 Gross per 24 hour  Intake 840 ml  Output 1200 ml  Net -360 ml   Filed Weights   12/13/23 1136 12/14/23 0532 12/15/23 0047  Weight: (!) 140.2 kg (!) 137.4 kg (!) 138 kg   Weight change: -2.161 kg Body mass index is 44.93 kg/m.   Physical Exam: General exam: Pleasant, elderly African-American male, morbidly obese Skin: No rashes, lesions or ulcers. HEENT: Atraumatic, normocephalic, no obvious bleeding Lungs: Clear to auscultation bilaterally,  CVS: S1, S2, no murmur,   GI/Abd: Soft, nontender, nondistended, bowel sound present,   CNS: Alert, awake, oriented x 3 Psychiatry: Mood appropriate Extremities: Trace to 1+ bilateral pedal edema, no calf tenderness,   Data Review: I have personally reviewed the laboratory data and studies available.  F/u labs ordered Unresulted Labs (From admission, onward)     Start     Ordered   12/16/23 0500  Basic metabolic panel with GFR  Tomorrow morning,   R        12/15/23 1304   12/16/23 0500  CBC with Differential/Platelet  Tomorrow morning,   R        12/15/23 1304            Signed, Chapman Rota, MD [facility] 12/15/2023

## 2023-12-15 NOTE — Progress Notes (Signed)
 22 Fr. Suprapubic catheter with 10 cc syringe placed under sterile procedure with clear yellow urine return. Pt tolerated well.    Durwood FELIX  RN

## 2023-12-16 ENCOUNTER — Encounter (HOSPITAL_COMMUNITY): Payer: Self-pay | Admitting: Hospitalist

## 2023-12-16 DIAGNOSIS — I5023 Acute on chronic systolic (congestive) heart failure: Secondary | ICD-10-CM

## 2023-12-16 DIAGNOSIS — I509 Heart failure, unspecified: Secondary | ICD-10-CM | POA: Diagnosis not present

## 2023-12-16 DIAGNOSIS — J9601 Acute respiratory failure with hypoxia: Secondary | ICD-10-CM | POA: Diagnosis not present

## 2023-12-16 DIAGNOSIS — I272 Pulmonary hypertension, unspecified: Secondary | ICD-10-CM

## 2023-12-16 DIAGNOSIS — I480 Paroxysmal atrial fibrillation: Secondary | ICD-10-CM | POA: Diagnosis not present

## 2023-12-16 DIAGNOSIS — N1832 Chronic kidney disease, stage 3b: Secondary | ICD-10-CM

## 2023-12-16 LAB — BASIC METABOLIC PANEL WITH GFR
Anion gap: 16 — ABNORMAL HIGH (ref 5–15)
BUN: 34 mg/dL — ABNORMAL HIGH (ref 8–23)
CO2: 24 mmol/L (ref 22–32)
Calcium: 8.6 mg/dL — ABNORMAL LOW (ref 8.9–10.3)
Chloride: 103 mmol/L (ref 98–111)
Creatinine, Ser: 1.96 mg/dL — ABNORMAL HIGH (ref 0.61–1.24)
GFR, Estimated: 35 mL/min — ABNORMAL LOW (ref >60)
Glucose, Bld: 123 mg/dL — ABNORMAL HIGH (ref 70–99)
Potassium: 3.6 mmol/L (ref 3.5–5.1)
Sodium: 143 mmol/L (ref 135–145)

## 2023-12-16 LAB — CBC WITH DIFFERENTIAL/PLATELET
Abs Immature Granulocytes: 0.02 K/uL (ref 0.00–0.07)
Basophils Absolute: 0.1 K/uL (ref 0.0–0.1)
Basophils Relative: 1 %
Eosinophils Absolute: 0.2 K/uL (ref 0.0–0.5)
Eosinophils Relative: 3 %
HCT: 36.6 % — ABNORMAL LOW (ref 39.0–52.0)
Hemoglobin: 11.6 g/dL — ABNORMAL LOW (ref 13.0–17.0)
Immature Granulocytes: 0 %
Lymphocytes Relative: 15 %
Lymphs Abs: 1 K/uL (ref 0.7–4.0)
MCH: 29.4 pg (ref 26.0–34.0)
MCHC: 31.7 g/dL (ref 30.0–36.0)
MCV: 92.9 fL (ref 80.0–100.0)
Monocytes Absolute: 0.7 K/uL (ref 0.1–1.0)
Monocytes Relative: 10 %
Neutro Abs: 4.9 K/uL (ref 1.7–7.7)
Neutrophils Relative %: 71 %
Platelets: 208 K/uL (ref 150–400)
RBC: 3.94 MIL/uL — ABNORMAL LOW (ref 4.22–5.81)
RDW: 15.5 % (ref 11.5–15.5)
WBC: 6.9 K/uL (ref 4.0–10.5)
nRBC: 0 % (ref 0.0–0.2)

## 2023-12-16 MED ORDER — POTASSIUM CHLORIDE CRYS ER 20 MEQ PO TBCR
40.0000 meq | EXTENDED_RELEASE_TABLET | Freq: Two times a day (BID) | ORAL | Status: DC
  Administered 2023-12-16 – 2023-12-22 (×12): 40 meq via ORAL
  Filled 2023-12-16 (×13): qty 2

## 2023-12-16 MED ORDER — FUROSEMIDE 10 MG/ML IJ SOLN
80.0000 mg | Freq: Two times a day (BID) | INTRAMUSCULAR | Status: DC
  Administered 2023-12-16 – 2023-12-18 (×4): 80 mg via INTRAVENOUS
  Filled 2023-12-16 (×4): qty 8

## 2023-12-16 MED ORDER — ISOSORBIDE MONONITRATE ER 30 MG PO TB24: 30.0000 mg | ORAL_TABLET | Freq: Every day | ORAL | Status: DC

## 2023-12-16 MED ORDER — METOPROLOL SUCCINATE ER 100 MG PO TB24
100.0000 mg | ORAL_TABLET | Freq: Every day | ORAL | Status: DC
  Administered 2023-12-16 – 2023-12-22 (×7): 100 mg via ORAL
  Filled 2023-12-16 (×7): qty 1

## 2023-12-16 MED ORDER — AMIODARONE HCL 200 MG PO TABS
200.0000 mg | ORAL_TABLET | Freq: Every day | ORAL | Status: DC
  Administered 2023-12-16 – 2023-12-17 (×2): 200 mg via ORAL
  Filled 2023-12-16 (×3): qty 1

## 2023-12-16 NOTE — Progress Notes (Signed)
 PROGRESS NOTE  Thomas [name]  DOB: 1950-03-02  PCP: [name], NP FMW:[id number]  DOA: 12/13/2023  LOS: 3 days  Hospital Day: 4  Subjective: Patient was seen and examined this morning. Propped up in bed.  Not in distress.  On 3 to 4 L oxygen . Wife at bedside.  Daughter and son on the phone.  Family was concerned about patient not getting enough nursing attention when he needs.  Also concerned about not being able to weigh him daily. Afebrile, heart rate in 60s, blood pressure in normal range, breathing on 4 L oxygen  Labs from this morning with BUN/creatinine 34/1.96 Echo yesterday with EF 40 to 45%  Brief narrative: Thomas [name] is a 73 y.o. male with PMH significant for chronic respiratory failure on 3-4L home O2, OSA, OHS, pulmonary hypertension, DVT/PE on Xarelto , HFmrEF (EF 40-45% in 06/2023), paroxysmal A-fib, type 2 diabetes mellitus, prostate cancer, urinary retention, chronic suprapubic catheter Last admitted to [facility] in 07/2023 for Klebsiella UTI and ADHF (diuresed 19 lbs)   10/27, patient presented to the ED with complaint of shortness of breath, dyspnea on exertion, progressive bilateral lower EXTR edema, two-pillow orthopnea, ongoing for about 2 weeks.  He has been sleeping on a recliner.  Reports inability to use CPAP at home.  In the ED, patient was tachycardic and tachypneic , on 4 his oxygen  which is his home requirement Labs with creatinine 2.21, baseline 1.9 from 10/9, BNP 829 Chest x-ray with basilar atelectasis Given IV Lasix  80 mg Admitted to [facility]  Assessment and plan: Acute exacerbation of chronic systolic CHF Severe pulmonary hypertension HTN Presented with volume overload symptoms, elevated BNP It seems patient had RHC/LHC done in 2019.  Already had moderate to severe pulmonary hypertension at that time. Echo from May 2025 and during this hospitalization 10/29 showed EF 40 to 45%, elevated PASP at 57 mmHg. PTA meds- metoprolol  100 mg  twice daily, hydralazine  75 mg 3 times daily, Imdur  30 mg daily, losartan  25 mg daily, torsemide 20 mg twice daily, Aldactone  25 mg daily Currently getting diuresed with IV Lasix .  More than 3 L negative balance since admission.   Also continued on metoprolol  and Imdur .   On hold are hydralazine , losartan  and Aldactone   Will consult cardiology for titration of GDMT, may also need medicines for pulmonary hypertension. Continue to monitor for daily intake output, weight, blood pressure, BNP, renal function and electrolytes. Net IO Since Admission: -2,885.66 mL [12/16/23 1413] Recent Labs  Lab 12/13/23 1149 12/14/23 0236 12/15/23 0243 12/16/23 0244  BNP 809.1*  --   --   --   BUN 44* 43* 37* 34*  CREATININE 2.21* 2.10* 1.76* 1.96*  NA 144 142 141 143  K 3.8 3.2* 3.0* 3.6   Hypokalemia Potassium low at 3 this morning.  Replacement given.  A-fib with RVR  paroxysmal A-fib Currently on metoprolol  titrate 100 mg twice daily Heart rate fluctuating, was as high as 130s during my evaluation this morning. Chronically anticoagulated with Xarelto  20 g daily  Type 2 diabetes mellitus A1c 5.7 on 08/19/2023 PTA meds-none Continue SSI/Accu-Cheks Recent Labs  Lab 12/14/23 0621  GLUCAP 119*   HLD PTA ASA 81mg  daily, atorvastatin  40mg  daily Continue both  Chronic respiratory failure  OSA/OHS on 3-4L home O2  DVT/PE Chronically anticoagulated on Xarelto   H/o prostate cancer Chronic urinary retention s/p chronic suprapubic catheter Patient follows up with urology at [facility] and gets suprapubic catheter changed monthly.   10/28, changed by RN in the  hospital.  Morbid Obesity  Body mass index is 44.5 kg/m. Patient has been advised to make an attempt to improve diet and exercise patterns to aid in weight loss.   Mobility: Wheelchair-bound mostly but able to walk inside the room PT Orders:   PT Follow up Rec:    Goals of care   Code Status: Full Code     DVT prophylaxis:    rivaroxaban  (XARELTO ) tablet 20 mg   Antimicrobials: None Fluid: None Consultants: None Family Communication: Wife at bedside  Status: Inpatient Level of care:  Telemetry Cardiac   Patient is from: Home Needs to continue in-hospital care: Needs cardiology evaluation Anticipated d/c to: Pending clinical course.  If improves, hopefully home in 1 to 2 days   Diet:  Diet Order             Diet 2 gram sodium Room service appropriate? Yes; Fluid consistency: Thin  Diet effective now                   Scheduled Meds:  amiodarone  200 mg Oral Daily   atorvastatin   40 mg Oral Daily   brimonidine  1 drop Left Eye BID   furosemide   80 mg Intravenous BID   isosorbide  mononitrate  30 mg Oral Daily   metoprolol  succinate  100 mg Oral Daily   potassium chloride   40 mEq Oral BID   rivaroxaban   20 mg Oral Q supper    PRN meds:    Infusions:     Antimicrobials: Anti-infectives (From admission, onward)    None       Objective: Vitals:   12/16/23 0728 12/16/23 1105  BP: 121/86 103/88  Pulse: 65 71  Resp: 18 20  Temp: 97.8 F (36.6 C) (!) 97.5 F (36.4 C)  SpO2: 96% 95%    Intake/Output Summary (Last 24 hours) at 12/16/2023 1413 Last data filed at 12/16/2023 1300 Gross per 24 hour  Intake 596 ml  Output 400 ml  Net 196 ml   Filed Weights   12/15/23 0047 12/16/23 0758 12/16/23 1205  Weight: (!) 138 kg (!) 136.9 kg (!) 136.7 kg   Weight change:  Body mass index is 44.5 kg/m.   Physical Exam: General exam: Pleasant, elderly African-American male, morbidly obese Skin: No rashes, lesions or ulcers. HEENT: Atraumatic, normocephalic, no obvious bleeding Lungs: Clear to auscultation bilaterally,  CVS: S1, S2, no murmur,   GI/Abd: Soft, nontender, nondistended, bowel sound present,   CNS: Alert, awake, oriented x 3 Psychiatry: Frustrated because of care related issues Extremities: Improving bilateral pedal edema, no calf tenderness,   Data Review: I  have personally reviewed the laboratory data and studies available.  F/u labs ordered Unresulted Labs (From admission, onward)     Start     Ordered   Unscheduled  Basic metabolic panel with GFR  Tomorrow morning,   R       Question:  Specimen collection method  Answer:  Lab=Lab collect   12/16/23 1412   Unscheduled  CBC with Differential/Platelet  Tomorrow morning,   R       Question:  Specimen collection method  Answer:  Lab=Lab collect   12/16/23 1412            Signed, Chapman Rota, MD [facility] 12/16/2023

## 2023-12-16 NOTE — Progress Notes (Signed)
    Durable Medical Equipment  (From admission, onward)           Start     Ordered   12/16/23 1449  For home use only DME Hospital bed  Once       Question Answer Comment  Length of Need Lifetime   Patient has (list medical condition): CHF   The above medical condition requires: Patient requires the ability to reposition frequently   Head must be elevated greater than: 30 degrees   Bed type Semi-electric   Support Surface: Gel Overlay      12/16/23 1449

## 2023-12-16 NOTE — Progress Notes (Signed)
 Mobility Specialist Progress Note:    12/16/23 1438  Mobility  Activity Ambulated with assistance  Level of Assistance Standby assist, set-up cues, supervision of patient - no hands on  Assistive Device Cane  Distance Ambulated (ft) 10 ft  Activity Response Tolerated well  Mobility Referral Yes  Mobility visit 1 Mobility  Mobility Specialist Start Time (ACUTE ONLY) 1438  Mobility Specialist Stop Time (ACUTE ONLY) 1445  Mobility Specialist Time Calculation (min) (ACUTE ONLY) 7 min   Received pt attempting to ambulate in room to sink for bath. Agreeable to assistance in room. No c/o any symptoms. Pt able to move and ambulate decently. Left pt in chair for wife to assist w/ bath. All needs met.   Venetia Keel Mobility Specialist Please Neurosurgeon or Rehab Office at [phone]

## 2023-12-16 NOTE — TOC Initial Note (Signed)
 Transition of Care [facility]) - Initial/Assessment Note    Patient Details  Name: Thomas [name] MRN: [id number] Date of Birth: 1950/11/15  [facility]) CM/SW Contact:    Waddell Barnie Rama, RN Phone Number: 12/16/2023, 2:57 PM  Clinical Narrative:                 From home with spouse, has PCP and insurance on file, states has no HH services in place at this time, has  home oxygen  3-4 liters with [employer], cpap machine, and motorized w/chair, and a cane at home.  States family member (wife)  will transport them home at [employer] and she  is support system, states gets medications from [facility].  Pta self ambulatory.   Patient states he would like to have a hospital bed,  wife states she will be the contact to set up for delivery her phone is [phone].  Wife and patient states to go thru [employer] for hospital bed.  NCM made referral to Zack with [employer] for the hospital bed.    Expected Discharge Plan: Home w Home Health Services Barriers to Discharge: Continued Medical Work up   Patient Goals and CMS Choice Patient states their goals for this hospitalization and ongoing recovery are:: return home with wife   Choice offered to / list presented to : NA      Expected Discharge Plan and Services In-house Referral: NA Discharge Planning Services: CM Consult Post Acute Care Choice: NA Living arrangements for the past 2 months: Single Family Home                 DME Arranged: Hospital bed DME Agency: [employer] Date DME Agency Contacted: 12/16/23 Time DME Agency Contacted: [phone] Representative spoke with at DME Agency: Zack HH Arranged: NA          Prior Living Arrangements/Services Living arrangements for the past 2 months: Single Family Home Lives with:: Spouse Patient language and need for interpreter reviewed:: Yes Do you feel safe going back to the place where you live?: Yes      Need for Family Participation in Patient Care: Yes (Comment) Care  giver support system in place?: Yes (comment) Current home services: DME (oxygen  3-4 liters with [employer], cpap machine, motorized w/chair, cane) Criminal Activity/Legal Involvement Pertinent to Current Situation/Hospitalization: No - Comment as needed  Activities of Daily Living   ADL Screening (condition at time of admission) Independently performs ADLs?: Yes (appropriate for developmental age) Is the patient deaf or have difficulty hearing?: Yes Does the patient have difficulty seeing, even when wearing glasses/contacts?: Yes Does the patient have difficulty concentrating, remembering, or making decisions?: No  Permission Sought/Granted Permission sought to share information with : Case Manager Permission granted to share information with : Yes, Verbal Permission Granted     Permission granted to share info w AGENCY: DME        Emotional Assessment Appearance:: Appears stated age Attitude/Demeanor/Rapport: Engaged Affect (typically observed): Appropriate Orientation: : Oriented to Self, Oriented to Place, Oriented to  Time, Oriented to Situation Alcohol  / Substance Use: Not Applicable Psych Involvement: No (comment)  Admission diagnosis:  Atrial fibrillation with rapid ventricular response (HCC) [I48.91] Acute on chronic heart failure (HCC) [I50.9] Acute on chronic congestive heart failure, unspecified heart failure type [facility]) [I50.9] Patient Active Problem List   Diagnosis Date Noted   Acute on chronic heart failure (HCC) 12/13/2023   Acute heart failure with mildly reduced ejection fraction (HFmrEF, 41-49%) (HCC) 08/07/2023  Heart failure with reduced ejection fraction (HCC) 08/03/2023   Acute on chronic respiratory failure with hypoxia (HCC) 08/03/2023   History of DVT (deep vein thrombosis) 08/03/2023   Normocytic anemia 08/03/2023   Presence of suprapubic catheter (HCC) 08/03/2023   Atrial fibrillation (HCC) 07/04/2023   Atrial fibrillation with RVR (HCC) 07/03/2023    Acute renal failure superimposed on stage 3b chronic kidney disease (HCC) 07/03/2023   CAD (coronary artery disease) 11/02/2022   Dyspnea 10/19/2017   Chronic respiratory failure with hypoxia (HCC) 10/19/2017   Chronic anticoagulation 07/27/2017   Pulmonary hypertension, unspecified (HCC)    Chest pain 06/01/2017   Benign hypertension with chronic kidney disease, stage III (HCC) 06/01/2017   Heart failure with mildly reduced ejection fraction (HFmrEF) (HCC) 06/01/2017   PAF (paroxysmal atrial fibrillation) (HCC) 05/31/2017   Total knee replacement status 11/19/2016   Meniscal injury 10/09/2016   Genetic testing 06/22/2016   DVT, lower extremity, proximal, acute, left (HCC) 01/23/2016   Hematuria 12/12/2015   Type 2 diabetes mellitus with chronic kidney disease, without long-term current use of insulin  (HCC) 12/05/2014   Essential hypertension 12/05/2014   BPH with obstruction/lower urinary tract symptoms 07/25/2014   Urine retention 07/25/2014   Urinary tract infection 07/09/2014   Anemia, iron  deficiency 11/22/2013   Urinary retention 11/01/2013   Incomplete emptying of bladder 07/26/2013   Nocturia 07/26/2013   Other nonspecific finding on examination of urine 07/26/2013   OSA (obstructive sleep apnea) 07/26/2013   Urethral stricture 07/26/2013   Bladder neck contracture 07/04/2013   Severe obesity (BMI >= 40) (HCC) 05/23/2013   B12 deficiency 05/23/2013   Hyperlipidemia LDL goal <70 09/02/2012   HTN (hypertension) 05/26/2012   Osteoarthritis, knee 05/26/2012   MORBID OBESITY 08/22/2008   PCP:  [name], NP Pharmacy:   [facility] [address], [address] [place] [address] Phone: [phone] Fax: [phone]  [facility] - [place], [place] - [address] [place] [address] Phone: [phone] Fax: [phone]     Social Drivers of Health (SDOH) Social History: SDOH  Screenings   Food Insecurity: No Food Insecurity (12/14/2023)  Housing: Unknown (12/16/2023)  Transportation Needs: No Transportation Needs (12/16/2023)  Utilities: Not At Risk (12/14/2023)  Alcohol  Screen: Low Risk  (12/16/2023)  Depression (PHQ2-9): Low Risk  (11/16/2023)  Financial Resource Strain: Low Risk  (12/16/2023)  Physical Activity: Inactive (12/09/2017)  Social Connections: Unknown (12/14/2023)  Recent Concern: Social Connections - Moderately Isolated (12/14/2023)  Stress: No Stress Concern Present (12/09/2017)  Tobacco Use: Low Risk  (12/13/2023)   SDOH Interventions: Housing Interventions: Intervention Not Indicated Transportation Interventions: Intervention Not Indicated Alcohol  Usage Interventions: Intervention Not Indicated (Score <7) Financial Strain Interventions: Intervention Not Indicated   Readmission Risk Interventions    12/16/2023    2:44 PM  Readmission Risk Prevention Plan  Transportation Screening Complete  HRI or Home Care Consult Complete  Palliative Care Screening Not Applicable  Medication Review (RN Care Manager) Complete

## 2023-12-16 NOTE — Consult Note (Signed)
 Cardiology Consultation:  Patient ID: Thomas [name] MRN: [id number]; DOB: 02/20/1950  Admit date: 12/13/2023 Date of Consult: 12/16/2023  Primary Care Provider: [name], NP Primary Cardiologist: Stanly DELENA Leavens, MD  Primary Electrophysiologist:  Eulas FORBES Furbish, MD   Patient Profile:  Thomas [name] is a 73 y.o. male with a hx of HFmEF, obesity (BMI 44), non-obstructive CAD, pulmonary hypertension, chronic respiratory failure on 3 L nasal cannula, OSA/OHS, DVT/PE, paroxysmal A-fib, CKD 3b, diabetes who is being seen today for the evaluation of systolic heart failure at the request of Chinita Rota, MD.  History of Present Illness:  Mr. Sproles was admitted to the hospital on 12/13/2023 for acute on chronic systolic heart failure.  He was admitted to the hospital with worsening shortness of breath and lower extremity edema.  He tells me he is watching his salt intake.  Admitted with acute on chronic systolic heart failure. He has undergone IV diuresis and is net -2.8 L.  Labs show stable kidney function with CKD stage IIIb.  EKG shows sinus rhythm heart rate 68 with nonspecific ST-T changes.  Telemetry shows he is having episodes of atrial fibrillation.  Still short of breath 2+ pitting edema.  Hypertension is secondary to left-sided heart failure based on could have a mixed component but he has sleep apnea tori failure.  He underwent a right and left heart catheterization in 2019.  That showed a PA pressure of 66/26/40. PCWP 21. TPG 19. PVR 1.8.   Past Medical History: Past Medical History:  Diagnosis Date   Acute on chronic diastolic CHF (congestive heart failure) (HCC) 06/01/2017   Arthritis    Cancer (HCC) 2010   Prostate   Chronic kidney disease    Diabetes mellitus    Diabetic neuropathy (HCC)    feet   DVT (deep venous thrombosis) (HCC)    Genetic testing 06/22/2016   Mr. Rosenzweig underwent genetic counseling and testing for hereditary cancer  syndromes on 05/14/2016. His results were negative for mutations in all 46 genes analyzed by [facility]'s 46-gene Common Hereditary Cancers Panel. Genes analyzed include: APC, ATM, AXIN2, BARD1, BMPR1A, BRCA1, BRCA2, BRIP1, CDH1, CDKN2A, CHEK2, CTNNA1, DICER1, EPCAM, GREM1, HOXB13, KIT, MEN1, MLH1, MSH2, MSH3, MSH6, MUTYH, NB   GERD (gastroesophageal reflux disease)    Hypertension    PE (pulmonary thromboembolism) (HCC)    Pneumonia    Sleep apnea    not wearing CPAP   Suprapubic catheter (HCC) 11/17/2019    Past Surgical History: Past Surgical History:  Procedure Laterality Date   CARDIOVERSION N/A 06/03/2017   Procedure: CARDIOVERSION;  Surgeon: Jeffrie Oneil BROCKS, MD;  Location: [facility];  Service: Cardiovascular;  Laterality: N/A;   CARDIOVERSION N/A 07/27/2023   Procedure: CARDIOVERSION;  Surgeon: Delford Maude BROCKS, MD;  Location: [facility] INVASIVE CV LAB;  Service: Cardiovascular;  Laterality: N/A;   CARDIOVERSION N/A 08/16/2023   Procedure: CARDIOVERSION;  Surgeon: Leavens Stanly DELENA, MD;  Location: [facility] INVASIVE CV LAB;  Service: Cardiovascular;  Laterality: N/A;   CATARACT EXTRACTION Left 07/31/2021   Dr.Glenn, [facility]   COLONOSCOPY     COLONOSCOPY WITH PROPOFOL  N/A 10/20/2018   Procedure: COLONOSCOPY WITH PROPOFOL ;  Surgeon: Eda Iha, MD;  Location: [facility] ENDOSCOPY;  Service: Gastroenterology;  Laterality: N/A;   HERNIA REPAIR     KNEE SURGERY     MULTIPLE TOOTH EXTRACTIONS     POLYPECTOMY  10/20/2018   Procedure: POLYPECTOMY;  Surgeon: Eda Iha, MD;  Location: [facility] ENDOSCOPY;  Service: Gastroenterology;;  PROSTATE SURGERY     RADIOACTIVE SEED IMPLANT     RIGHT/LEFT HEART CATH AND CORONARY ANGIOGRAPHY N/A 07/06/2017   Procedure: RIGHT/LEFT HEART CATH AND CORONARY ANGIOGRAPHY;  Surgeon: Dann Candyce RAMAN, MD;  [facility];  Service: Cardiovascular;  Laterality: N/A;   SHOULDER SURGERY     TOTAL KNEE ARTHROPLASTY Right 11/19/2016    Procedure: RIGHT TOTAL KNEE ARTHROPLASTY;  Surgeon: Jerri Kay HERO, MD;  Location: [facility] OR;  Service: Orthopedics;  Laterality: Right;   uretha surgery-2014       Allergies:    Allergies  Allergen Reactions   Lisinopril  Cough    Muscle pain    Social History:   Social History   Socioeconomic History   Marital status: Married    Spouse name: Heron   Number of children: 5   Years of education: Not on file   Highest education level: 12th grade  Occupational History   Occupation: Retired  Tobacco Use   Smoking status: Never    Passive exposure: Never   Smokeless tobacco: Never   Tobacco comments:    Never smoked 09/03/23  Vaping Use   Vaping status: Never Used  Substance and Sexual Activity   Alcohol  use: No    Comment: never   Drug use: No   Sexual activity: Yes  Other Topics Concern   Not on file  Social History Narrative   Not on file   Social Drivers of Health   Financial Resource Strain: Low Risk  (12/16/2023)   Overall Financial Resource Strain (CARDIA)    Difficulty of Paying Living Expenses: Not hard at all  Food Insecurity: No Food Insecurity (12/14/2023)   Hunger Vital Sign    Worried About Running Out of Food in the Last Year: Never true    Ran Out of Food in the Last Year: Never true  Transportation Needs: No Transportation Needs (12/16/2023)   PRAPARE - Administrator, Civil Service (Medical): No    Lack of Transportation (Non-Medical): No  Physical Activity: Inactive (12/09/2017)   Exercise Vital Sign    Days of Exercise per Week: 0 days    Minutes of Exercise per Session: 0 min  Stress: No Stress Concern Present (12/09/2017)   [employer] of Occupational Health - Occupational Stress Questionnaire    Feeling of Stress : Only a little  Social Connections: Unknown (12/14/2023)   Social Connection and Isolation Panel    Frequency of Communication with Friends and Family: More than three times a week    Frequency of Social  Gatherings with Friends and Family: More than three times a week    Attends Religious Services: Not on Marketing Executive or Organizations: No    Attends Banker Meetings: Never    Marital Status: Married  Recent Concern: Social Connections - Moderately Isolated (12/14/2023)   Social Connection and Isolation Panel    Frequency of Communication with Friends and Family: More than three times a week    Frequency of Social Gatherings with Friends and Family: More than three times a week    Attends Religious Services: Never    Database Administrator or Organizations: Not on file    Attends Banker Meetings: Never    Marital Status: Married  Catering Manager Violence: Not At Risk (12/14/2023)   Humiliation, Afraid, Rape, and Kick questionnaire    Fear of Current or Ex-Partner: No    Emotionally Abused: No  Physically Abused: No    Sexually Abused: No     Family History:    Family History  Problem Relation Age of Onset   Breast cancer Mother 46       d.89   Breast cancer Sister 30       d.30   Leukemia Brother 18       d.20   Breast cancer Maternal Aunt 40       d.40s   Lung cancer Maternal Uncle    Prostate cancer Paternal Uncle    Prostate cancer Brother        recurred recently at age 4   Other Brother 15       spinal tumor   Cervical cancer Other 22       d.22   Cancer Sister 38       unspecified type   Cancer Maternal Uncle 80       unspecified type     ROS:  All other ROS reviewed and negative. Pertinent positives noted in the HPI.     Physical Exam/Data:   Vitals:   12/16/23 0728 12/16/23 0758 12/16/23 1105 12/16/23 1205  BP: 121/86  103/88   Pulse: 65  71   Resp: 18  20   Temp: 97.8 F (36.6 C)  (!) 97.5 F (36.4 C)   TempSrc: Oral  Oral   SpO2: 96%  95%   Weight:  (!) 136.9 kg  (!) 136.7 kg  Height:        Intake/Output Summary (Last 24 hours) at 12/16/2023 1333 Last data filed at 12/16/2023 1300 Gross per  24 hour  Intake 596 ml  Output 400 ml  Net 196 ml       12/16/2023   12:05 PM 12/16/2023    7:58 AM 12/15/2023   12:47 AM  Last 3 Weights  Weight (lbs) 301 lb 5.9 oz 301 lb 13 oz 304 lb 3.8 oz  Weight (kg) 136.7 kg 136.9 kg 138 kg    Body mass index is 44.5 kg/m.  General: Well nourished, well developed, in no acute distress Head: Atraumatic, normal size  Eyes: PEERLA, EOMI  Neck: Supple, no JVD Endocrine: No thryomegaly Cardiac: Normal S1, S2; RRR; no murmurs, rubs, or gallops Lungs: Clear to auscultation bilaterally, no wheezing, rhonchi or rales  Abd: Soft, nontender, no hepatomegaly  Ext: No edema, pulses 2+ Musculoskeletal: No deformities, BUE and BLE strength normal and equal Skin: Warm and dry, no rashes   Neuro: Alert and oriented to person, place, time, and situation, CNII-XII grossly intact, no focal deficits  Psych: Normal mood and affect   EKG:  The EKG was personally reviewed and demonstrates: Normal sinus rhythm heart rate 68, nonspecific ST-T changes Telemetry:  Telemetry was personally reviewed and demonstrates: Sinus rhythm 60 to 70 bpm, paroxysmal A-fib  Relevant CV Studies: TTE 12/15/2023  1. Left ventricular ejection fraction, by estimation, is 40 to 45%. The  left ventricle has mildly decreased function. Left ventricular endocardial  border not optimally defined to evaluate regional wall motion. There is  moderate left ventricular  hypertrophy. Left ventricular diastolic parameters are consistent with  Grade I diastolic dysfunction (impaired relaxation).   2. Right ventricular systolic function was not well visualized. The right  ventricular size is not well visualized. There is moderately elevated  pulmonary artery systolic pressure. The estimated right ventricular  systolic pressure is 57.0 mmHg.   3. Left atrial size was mildly dilated.   4. The mitral valve  is abnormal. Trivial mitral valve regurgitation.   5. The tricuspid valve is abnormal.    6. The aortic valve is tricuspid. Aortic valve regurgitation is not  visualized. Aortic valve sclerosis/calcification is present, without any  evidence of aortic stenosis.   7. The inferior vena cava is dilated in size with <50% respiratory  variability, suggesting right atrial pressure of 15 mmHg.   LHC/RHC 07/06/2017 There is mild left ventricular systolic dysfunction. LV end diastolic pressure is moderately elevated, 30 mm Hg. The left ventricular ejection fraction is 45-50% by visual estimate. There is no aortic valve stenosis. Hemodynamic findings consistent with moderate to severe pulmonary hypertension. CO 10.8 L/min; CI 4.2, Ao sat 91%; PA sat 72%; mean PCWP 21 mm Hg Nonobstructive CAD. Prox Cx lesion is 25% stenosed.  Assessment and Plan:   # Acute on chronic systolic heart failure, EF 40-45% - Still volume overloaded.  Start Lasix  IV 80 mg twice daily.  Potassium 40 mill equivalents twice daily. - CKD 3B not a candidate for ACE/ARB/ARNI/MRA. - Transition to metoprolol  succinate 100 mg daily.  Continue Imdur  30 mg daily.  Will try to get on hydralazine  tomorrow. - Not a candidate for SGLT2 inhibitor given morbid obesity.  Would be high risk for likely UTI.  # Pulmonary hypertension, group 2/3 Mixed  - Based on right heart cath in 2019 he likely has group 2 pulmonary hypertension related to elevated left-sided filling pressures.  Cannot entirely exclude group 3 with OSA and OHS but this is just treated conservatively with oxygen . - I do not believe he has any autoimmune pulmonary hypertension or anything to worry about.  We will diurese and treat this medically.  # OSA # OHS # CTEPH? - Reviewed records from pulmonary.  He does not have COPD.  He does have chronic respiratory failure but this is related to OHS CHF and sleep apnea.   - There is mention of CHF but CT PE study showed no evidence of chronic embolism. - See discussion below on amiodarone.  I think he can tolerate  this. - Contributing to pulmonary hypertension.  # Paroxysmal A-fib - No intrinsic lung disease based on review of records from pulmonary. - We will stop diltiazem  drip.  He should not be on this due to low EF. - Start amiodarone 200 mg daily.  Hopefully this will help keep him in rhythm. - Transition to metoprolol  succinate 200 mg daily. - Continue Xarelto .  # CKD 3b - Stable.  Diuresis as above.  # Nonobstructive CAD - Stop aspirin  since he is on Xarelto .  Continue statin.       For questions or updates, please contact [facility] Please consult [email or web address] for contact info under     Signed, Darryle T. Barbaraann, MD, [facility] [place]  [facility]  12/16/2023 1:33 PM

## 2023-12-16 NOTE — Progress Notes (Signed)
 Heart Failure Nurse Navigator Progress Note  PCP: [name], NP PCP-Cardiologist: Delford Admission Diagnosis: Acute on chronic congestive heart failure, Atrial fibrillation with RVR.  Admitted from: Home via EMS  Presentation:   Thomas [name] presented with shortness of breath, sleeping in recliner, swelling in legs and feet.The patient used a CPAP machine with oxygen  but could not continue its use since the onset of these worsened symptoms  BP 162/108, HR 84, BNP 809, Chest x-ray: Shows no acute abnormality in the chest, just some atelectasis,   Patient and wife were both educated on the sign and symptoms of heart failure. Diet/ fluid restrictions, patient reported to eating foods that are salty and drinking more then 64 oz of fluids daily, including Gatorade. Continued HF education on taking all medications as prescribed and attending all medical appointments. Patient and wife verbalized their understanding of all education. A HF TOC appointment was scheduled for 12/27/2023 @ 10:15 am.   ECHO/ LVEF: 40-45%  Clinical Course:  Past Medical History:  Diagnosis Date   Acute on chronic diastolic CHF (congestive heart failure) (HCC) 06/01/2017   Arthritis    Cancer (HCC) 2010   Prostate   Chronic kidney disease    Diabetes mellitus    Diabetic neuropathy (HCC)    feet   DVT (deep venous thrombosis) (HCC)    Genetic testing 06/22/2016   Mr. Tejada underwent genetic counseling and testing for hereditary cancer syndromes on 05/14/2016. His results were negative for mutations in all 46 genes analyzed by [facility]'s 46-gene Common Hereditary Cancers Panel. Genes analyzed include: APC, ATM, AXIN2, BARD1, BMPR1A, BRCA1, BRCA2, BRIP1, CDH1, CDKN2A, CHEK2, CTNNA1, DICER1, EPCAM, GREM1, HOXB13, KIT, MEN1, MLH1, MSH2, MSH3, MSH6, MUTYH, NB   GERD (gastroesophageal reflux disease)    Hypertension    PE (pulmonary thromboembolism) (HCC)    Pneumonia    Sleep apnea    not wearing CPAP    Suprapubic catheter (HCC) 11/17/2019     Social History   Socioeconomic History   Marital status: Married    Spouse name: Heron   Number of children: Not on file   Years of education: Not on file   Highest education level: 12th grade  Occupational History   Not on file  Tobacco Use   Smoking status: Never    Passive exposure: Never   Smokeless tobacco: Never   Tobacco comments:    Never smoked 09/03/23  Vaping Use   Vaping status: Never Used  Substance and Sexual Activity   Alcohol  use: No    Comment: never   Drug use: No   Sexual activity: Yes  Other Topics Concern   Not on file  Social History Narrative   Not on file   Social Drivers of Health   Financial Resource Strain: Patient Declined (07/06/2023)   Overall Financial Resource Strain (CARDIA)    Difficulty of Paying Living Expenses: Patient declined  Food Insecurity: No Food Insecurity (12/14/2023)   Hunger Vital Sign    Worried About Running Out of Food in the Last Year: Never true    Ran Out of Food in the Last Year: Never true  Transportation Needs: No Transportation Needs (12/14/2023)   PRAPARE - Administrator, Civil Service (Medical): No    Lack of Transportation (Non-Medical): No  Physical Activity: Inactive (12/09/2017)   Exercise Vital Sign    Days of Exercise per Week: 0 days    Minutes of Exercise per Session: 0 min  Stress: No Stress  Concern Present (12/09/2017)   [employer] of Occupational Health - Occupational Stress Questionnaire    Feeling of Stress : Only a little  Social Connections: Unknown (12/14/2023)   Social Connection and Isolation Panel    Frequency of Communication with Friends and Family: More than three times a week    Frequency of Social Gatherings with Friends and Family: More than three times a week    Attends Religious Services: Not on Marketing Executive or Organizations: No    Attends Banker Meetings: Never    Marital Status:  Married  Recent Concern: Social Connections - Moderately Isolated (12/14/2023)   Social Connection and Isolation Panel    Frequency of Communication with Friends and Family: More than three times a week    Frequency of Social Gatherings with Friends and Family: More than three times a week    Attends Religious Services: Never    Database Administrator or Organizations: Not on file    Attends Banker Meetings: Never    Marital Status: Married   Water Engineer and Provision:  Detailed education and instructions provided on heart failure disease management including the following:  Signs and symptoms of Heart Failure When to call the physician Importance of daily weights Low sodium diet Fluid restriction Medication management Anticipated future follow-up appointments  Patient education given on each of the above topics.  Patient acknowledges understanding via teach back method and acceptance of all instructions.  Education Materials:  Living Better With Heart Failure Booklet, HF zone tool, & Daily Weight Tracker Tool.  Patient has scale at home: Yes Patient has pill box at home: NA    High Risk Criteria for Readmission and/or Poor Patient Outcomes: Heart failure hospital admissions (last 6 months): 2  No Show rate: 5 %  Difficult social situation: No, Lives with his wife  Demonstrates medication adherence: Yes Primary Language: English  Literacy level: Reading, writing, and comprehension   Barriers of Care:   Diet/ fluid restrictions ( drinks over 64 oz per day) some salt Daily weights  Considerations/Referrals:   Referral made to Heart Failure Pharmacist Stewardship: Yes Referral made to Heart Failure CSW/NCM TOC: No Referral made to Heart & Vascular TOC clinic: Yes, 12/27/2023 @ 10:15 am   Items for Follow-up on DC/TOC: Continued HF education Diet/ fluid restrictions/ daily weights   Stephane Haddock, BSN, RN Heart Failure Therapist, Sports Only

## 2023-12-16 NOTE — Plan of Care (Signed)
  Problem: Education: Goal: Knowledge of General Education information will improve Description: Including pain rating scale, medication(s)/side effects and non-pharmacologic comfort measures Outcome: Progressing   Problem: Clinical Measurements: Goal: Ability to maintain clinical measurements within normal limits will improve Outcome: Progressing   Problem: Clinical Measurements: Goal: Will remain free from infection Outcome: Progressing   Problem: Clinical Measurements: Goal: Cardiovascular complication will be avoided Outcome: Progressing   Problem: Clinical Measurements: Goal: Respiratory complications will improve Outcome: Progressing   Problem: Nutrition: Goal: Adequate nutrition will be maintained Outcome: Progressing   Problem: Coping: Goal: Level of anxiety will decrease Outcome: Progressing   Problem: Safety: Goal: Ability to remain free from injury will improve Outcome: Progressing   Problem: Pain Managment: Goal: General experience of comfort will improve and/or be controlled Outcome: Progressing

## 2023-12-17 ENCOUNTER — Telehealth: Payer: Self-pay | Admitting: Internal Medicine

## 2023-12-17 DIAGNOSIS — N1832 Chronic kidney disease, stage 3b: Secondary | ICD-10-CM | POA: Diagnosis not present

## 2023-12-17 DIAGNOSIS — I5023 Acute on chronic systolic (congestive) heart failure: Secondary | ICD-10-CM | POA: Diagnosis not present

## 2023-12-17 DIAGNOSIS — I509 Heart failure, unspecified: Secondary | ICD-10-CM | POA: Diagnosis not present

## 2023-12-17 DIAGNOSIS — I480 Paroxysmal atrial fibrillation: Secondary | ICD-10-CM | POA: Diagnosis not present

## 2023-12-17 DIAGNOSIS — Z0279 Encounter for issue of other medical certificate: Secondary | ICD-10-CM

## 2023-12-17 DIAGNOSIS — I272 Pulmonary hypertension, unspecified: Secondary | ICD-10-CM | POA: Diagnosis not present

## 2023-12-17 LAB — CBC WITH DIFFERENTIAL/PLATELET
Abs Immature Granulocytes: 0.03 K/uL (ref 0.00–0.07)
Basophils Absolute: 0.1 K/uL (ref 0.0–0.1)
Basophils Relative: 1 %
Eosinophils Absolute: 0.4 K/uL (ref 0.0–0.5)
Eosinophils Relative: 5 %
HCT: 37 % — ABNORMAL LOW (ref 39.0–52.0)
Hemoglobin: 11.8 g/dL — ABNORMAL LOW (ref 13.0–17.0)
Immature Granulocytes: 0 %
Lymphocytes Relative: 21 %
Lymphs Abs: 1.4 K/uL (ref 0.7–4.0)
MCH: 29 pg (ref 26.0–34.0)
MCHC: 31.9 g/dL (ref 30.0–36.0)
MCV: 90.9 fL (ref 80.0–100.0)
Monocytes Absolute: 0.6 K/uL (ref 0.1–1.0)
Monocytes Relative: 9 %
Neutro Abs: 4.3 K/uL (ref 1.7–7.7)
Neutrophils Relative %: 64 %
Platelets: 238 K/uL (ref 150–400)
RBC: 4.07 MIL/uL — ABNORMAL LOW (ref 4.22–5.81)
RDW: 15.5 % (ref 11.5–15.5)
WBC: 6.8 K/uL (ref 4.0–10.5)
nRBC: 0 % (ref 0.0–0.2)

## 2023-12-17 LAB — BASIC METABOLIC PANEL WITH GFR
Anion gap: 16 — ABNORMAL HIGH (ref 5–15)
BUN: 39 mg/dL — ABNORMAL HIGH (ref 8–23)
CO2: 26 mmol/L (ref 22–32)
Calcium: 8.8 mg/dL — ABNORMAL LOW (ref 8.9–10.3)
Chloride: 101 mmol/L (ref 98–111)
Creatinine, Ser: 2.11 mg/dL — ABNORMAL HIGH (ref 0.61–1.24)
GFR, Estimated: 32 mL/min — ABNORMAL LOW (ref >60)
Glucose, Bld: 113 mg/dL — ABNORMAL HIGH (ref 70–99)
Potassium: 4 mmol/L (ref 3.5–5.1)
Sodium: 143 mmol/L (ref 135–145)

## 2023-12-17 MED ORDER — METOLAZONE 5 MG PO TABS
10.0000 mg | ORAL_TABLET | Freq: Once | ORAL | Status: AC
  Administered 2023-12-17: 10 mg via ORAL
  Filled 2023-12-17: qty 2

## 2023-12-17 MED ORDER — EMPAGLIFLOZIN 10 MG PO TABS
10.0000 mg | ORAL_TABLET | Freq: Every day | ORAL | Status: DC
  Administered 2023-12-17: 10 mg via ORAL
  Filled 2023-12-17: qty 1

## 2023-12-17 MED ORDER — AMIODARONE HCL 200 MG PO TABS
400.0000 mg | ORAL_TABLET | Freq: Two times a day (BID) | ORAL | Status: DC
Start: 2023-12-17 — End: 2023-12-24
  Administered 2023-12-17 – 2023-12-22 (×10): 400 mg via ORAL
  Filled 2023-12-17 (×10): qty 2

## 2023-12-17 MED ORDER — HYDRALAZINE HCL 50 MG PO TABS
50.0000 mg | ORAL_TABLET | Freq: Three times a day (TID) | ORAL | Status: DC
  Administered 2023-12-17 – 2023-12-20 (×8): 50 mg via ORAL
  Filled 2023-12-17 (×9): qty 1

## 2023-12-17 MED ORDER — AMIODARONE HCL 200 MG PO TABS: 200.0000 mg | ORAL_TABLET | Freq: Every day | ORAL | Status: DC

## 2023-12-17 NOTE — Telephone Encounter (Signed)
 Pt's son Crooke, Mickey.) came into to office to drop off FMLA paperwork for care of his father.   Pt paid $29 fee & completed ROI & Billing forms.  Original forms left in Dr. Milan mailbox. Pt is aware of 7-14 business day turnaround for completion of paperwork.   JB, 12-17-23

## 2023-12-17 NOTE — Progress Notes (Addendum)
 Cardiology Progress Note  Patient ID: Thomas [name] MRN: [id number] DOB: Dec 06, 1950 Date of Encounter: 12/17/2023 Primary Cardiologist: Stanly DELENA Leavens, MD  Subjective   Chief Complaint: SOB  HPI: Still volume up. SOB. Paroxysmal Afib noted  ROS:  All other ROS reviewed and negative. Pertinent positives noted in the HPI.     Telemetry  Overnight telemetry shows SR 60s, paroxysmal afib, which I personally reviewed.     Physical Exam   Vitals:   12/17/23 0003 12/17/23 0421 12/17/23 0500 12/17/23 0710  BP: (!) 118/103 (!) 137/94  (!) 145/94  Pulse: 65 (!) 101  60  Resp: (!) 22 20  18   Temp: 97.9 F (36.6 C) 97.6 F (36.4 C)  (!) 97.5 F (36.4 C)  TempSrc: Oral Oral  Oral  SpO2: 97% 94%  97%  Weight:   (!) 137 kg   Height:        Intake/Output Summary (Last 24 hours) at 12/17/2023 1039 Last data filed at 12/17/2023 0900 Gross per 24 hour  Intake 720.25 ml  Output 1500 ml  Net -779.75 ml       12/17/2023    5:00 AM 12/16/2023   12:05 PM 12/16/2023    7:58 AM  Last 3 Weights  Weight (lbs) 302 lb 301 lb 5.9 oz 301 lb 13 oz  Weight (kg) 136.986 kg 136.7 kg 136.9 kg    Body mass index is 44.6 kg/m.  General: Well nourished, well developed, in no acute distress Head: Atraumatic, normal size  Eyes: PEERLA, EOMI  Neck: Supple, no JVD Endocrine: No thryomegaly Cardiac: Normal S1, S2; RRR; no murmurs, rubs, or gallops Lungs: Diminished breath sounds bilaterally Abd: Soft, nontender, no hepatomegaly  Ext: 2+ pitting edema Musculoskeletal: No deformities, BUE and BLE strength normal and equal Skin: Warm and dry, no rashes   Neuro: Alert and oriented to person, place, time, and situation, CNII-XII grossly intact, no focal deficits  Psych: Normal mood and affect   Cardiac Studies  TTE 12/15/2023  1. Left ventricular ejection fraction, by estimation, is 40 to 45%. The  left ventricle has mildly decreased function. Left ventricular endocardial  border  not optimally defined to evaluate regional wall motion. There is  moderate left ventricular  hypertrophy. Left ventricular diastolic parameters are consistent with  Grade I diastolic dysfunction (impaired relaxation).   2. Right ventricular systolic function was not well visualized. The right  ventricular size is not well visualized. There is moderately elevated  pulmonary artery systolic pressure. The estimated right ventricular  systolic pressure is 57.0 mmHg.   3. Left atrial size was mildly dilated.   4. The mitral valve is abnormal. Trivial mitral valve regurgitation.   5. The tricuspid valve is abnormal.   6. The aortic valve is tricuspid. Aortic valve regurgitation is not  visualized. Aortic valve sclerosis/calcification is present, without any  evidence of aortic stenosis.   7. The inferior vena cava is dilated in size with <50% respiratory  variability, suggesting right atrial pressure of 15 mmHg.   Patient Profile  Thomas [name] is a 73 y.o. male with heart failure midrange ejection fraction, obesity, nonobstructive CAD, pulmonary hypertension, chronic respiratory failure, OSA/OHS, DVT/PE, paroxysmal A-fib, CKD 3B admitted on 12/13/2023 for acute on chronic systolic heart failure.  Assessment & Plan   # Acute on chronic systolic heart failure, EF 40-45% - Still volume up.  Continue 80 mg IV Lasix  twice daily.  Add 10 mg of metolazone today. - CKD 3B.  Kidney  function is stable. - Continue metoprolol  succinate 100 mg daily.  Continue Imdur  30 mg daily.  Add 50 mg 3 times daily of hydralazine . - I have held his home losartan  and Aldactone .  Likely not a good candidate for this moving forward given kidney disease. - chronic foley, not good candidate for SGLT2i   # Pulmonary hypertension, group 2/3 - Likely mixed.  Definitely has systolic heart failure contributing.  Has OSA/OHS which is likely contributing as well. - Treatment is supportive for pulmonary disease.  Volume  removal with heart failure.  # CKD 3B - Stable.  Diuresis as above.  # Paroxysmal A-fib - Reviewed records.  Has OSA/OHS.  No COPD.  Has been in and out of A-fib.  Increase amiodarone to 400 mg twice daily for 7 days followed by 200 mg daily.  Hopefully this will help suppress his rhythm. - Continue Xarelto . - Metoprolol  100 mg of succinate as above.  # Nonobstructive CAD - Left heart cath 2019 with nonobstructive disease.  No need for aspirin .  On statin.  # OSA # OHS # CTEPH? - Reviewed records from pulmonary.  Has OSA and OHS.  There is mention of chronic thromboembolic pulmonary hypertension.  Recent CT study showed no evidence of chronic PE.  I think this is all OSA and OHS.  Can be on amiodarone as above.     For questions or updates, please contact [facility] Please consult [email or web address] for contact info under        Signed, Darryle T. Barbaraann, MD, [facility]  [facility]  12/17/2023 10:39 AM

## 2023-12-17 NOTE — Plan of Care (Signed)
 ?  Problem: Clinical Measurements: ?Goal: Will remain free from infection ?Outcome: Progressing ?  ?

## 2023-12-17 NOTE — Progress Notes (Signed)
 PROGRESS NOTE  Thomas [name] Spates  DOB: [birth year]  PCP: [name], NP FMW:[id number]  DOA: 12/13/2023  LOS: 4 days  Hospital Day: 5  Subjective: Patient was seen and examined this morning. Lying on bed.  Not in distress Wife at bedside. Afebrile, blood pressure in 130s to 140s, heart rate overall controlled in the 60s, blood while I was at his bedside, heart rate constantly fluctuated between 60 and 130s. Labs from this morning with creatinine elevated 2.11  Brief narrative: Thomas [name] is a 73 y.o. male with PMH significant for chronic respiratory failure on 3-4L home O2, OSA, OHS, pulmonary hypertension, DVT/PE on Xarelto , HFmrEF (EF 40-45% in 06/2023), paroxysmal A-fib, type 2 diabetes mellitus, prostate cancer, urinary retention, chronic suprapubic catheter Last admitted to [facility] in 07/2023 for Klebsiella UTI and ADHF (diuresed 19 lbs)   10/27, patient presented to the ED with complaint of shortness of breath, dyspnea on exertion, progressive bilateral lower EXTR edema, two-pillow orthopnea, ongoing for about 2 weeks.  He has been sleeping on a recliner.  Reports inability to use CPAP at home.  In the ED, patient was tachycardic and tachypneic , on 4 his oxygen  which is his home requirement Labs with creatinine 2.21, baseline 1.9 from 10/9, BNP 829 Chest x-ray with basilar atelectasis Given IV Lasix  80 mg Admitted to [facility]  Assessment and plan: Acute exacerbation of chronic systolic CHF Severe pulmonary hypertension HTN Presented with volume overload symptoms, elevated BNP It seems patient had RHC/LHC done in 2019.  Already had moderate to severe pulmonary hypertension at that time. Echo from May 2025 and during this hospitalization 10/29 showed EF 40 to 45%, elevated PASP at 57 mmHg. PTA meds- metoprolol  100 mg twice daily, hydralazine  75 mg 3 times daily, Imdur  30 mg daily, losartan  25 mg daily, torsemide 20 mg twice daily, Aldactone  25 mg daily Cardiology  consult appreciated Currently getting diuresed with IV Lasix .  More than 3.7 L negative balance since admission.  Noted plan by cardiology to add 10 mg of metolazone today. Also continued on Toprol  100 mg daily and Imdur  30 mg daily.  Cardiology added hydralazine  50 mg 3 times daily today. On hold are losartan  and Aldactone   Continue to monitor for daily intake output, weight, blood pressure, BNP, renal function and electrolytes. Net IO Since Admission: -4,249.41 mL [12/17/23 1354] Recent Labs  Lab 12/13/23 1149 12/14/23 0236 12/15/23 0243 12/16/23 0244 12/17/23 0250  BNP 809.1*  --   --   --   --   BUN 44* 43* 37* 34* 39*  CREATININE 2.21* 2.10* 1.76* 1.96* 2.11*  NA 144 142 141 143 143  K 3.8 3.2* 3.0* 3.6 4.0   Hypokalemia Potassium level improved with replacement  A-fib with RVR  Paroxysmal A-fib In and out of A-fib with RVR. Currently on amiodarone 400 mg twice daily for 7 days to be followed by 20 mg daily Also continued on Toprol  100 mg daily. Chronically anticoagulated with Xarelto  20 g daily  HLD PTA ASA 81mg  daily, atorvastatin  40mg  daily Left heart cath from 2019 with nonobstructive disease. Per cardiology, no need of aspirin  while on Xarelto .  Continue statin   Chronic respiratory failure  OSA/OHS on 3-4L home O2 Remains on baseline requirement currently at this time  DVT/PE Chronically anticoagulated on Xarelto   H/o prostate cancer Chronic urinary retention s/p chronic suprapubic catheter Patient follows up with urology at [facility] and gets suprapubic catheter changed monthly.   10/28, changed by RN in the hospital.  Morbid Obesity  Body mass index is 44.6 kg/m. Patient has been advised to make an attempt to improve diet and exercise patterns to aid in weight loss.   Mobility: Wheelchair-bound mostly but able to walk inside the room.  PT eval requested PT Orders:   PT Follow up Rec: Home Health Pt10/31/2025 1045   Goals of care   Code Status: Full  Code     DVT prophylaxis:   rivaroxaban  (XARELTO ) tablet 20 mg   Antimicrobials: None Fluid: None Consultants: None Family Communication: Wife at bedside  Status: Inpatient Level of care:  Telemetry Cardiac   Patient is from: Home Needs to continue in-hospital care: Ongoing IV diuresis Anticipated d/c to: Pending clinical course.  If improves, hopefully home in 1 to 2 days   Diet:  Diet Order             Diet 2 gram sodium Room service appropriate? Yes; Fluid consistency: Thin  Diet effective now                   Scheduled Meds:  amiodarone  400 mg Oral BID   Followed by   NOREEN ON 12/25/2023] amiodarone  200 mg Oral Daily   atorvastatin   40 mg Oral Daily   brimonidine  1 drop Left Eye BID   furosemide   80 mg Intravenous BID   hydrALAZINE   50 mg Oral Q8H   isosorbide  mononitrate  30 mg Oral Daily   metoprolol  succinate  100 mg Oral Daily   potassium chloride   40 mEq Oral BID   rivaroxaban   20 mg Oral Q supper    PRN meds:    Infusions:     Antimicrobials: Anti-infectives (From admission, onward)    None       Objective: Vitals:   12/17/23 1130 12/17/23 1343  BP: (!) 121/93 (!) 121/93  Pulse: (!) 129   Resp: 18   Temp: 97.8 F (36.6 C)   SpO2: 92%     Intake/Output Summary (Last 24 hours) at 12/17/2023 1354 Last data filed at 12/17/2023 1300 Gross per 24 hour  Intake 836.25 ml  Output 2200 ml  Net -1363.75 ml   Filed Weights   12/16/23 0758 12/16/23 1205 12/17/23 0500  Weight: (!) 136.9 kg (!) 136.7 kg (!) 137 kg   Weight change:  Body mass index is 44.6 kg/m.   Physical Exam: General exam: Pleasant, elderly African-American male, morbidly obese Skin: No rashes, lesions or ulcers. HEENT: Atraumatic, normocephalic, no obvious bleeding Lungs: Clear to auscultation bilaterally CVS: S1, S2, no murmur,   GI/Abd: Soft, nontender, nondistended, bowel sound present,   CNS: Alert, awake, oriented x 3 Psychiatry: Mood  appropriate Extremities: Improving bilateral pedal edema, no calf tenderness,   Data Review: I have personally reviewed the laboratory data and studies available.  F/u labs ordered Unresulted Labs (From admission, onward)     Start     Ordered   Unscheduled  Basic metabolic panel with GFR  Daily,   R     Question:  Specimen collection method  Answer:  Lab=Lab collect   12/17/23 1354            Signed, Chapman Rota, MD [facility] 12/17/2023

## 2023-12-17 NOTE — Progress Notes (Signed)
   Heart Failure Stewardship Pharmacist Progress Note   PCP: [name], NP PCP-Cardiologist: Stanly DELENA Leavens, MD    HPI:  73 yo M with PMH of chronic respiratory failure, DVT/PE, CHF, afib, diabetes, T2DM, HTN, OSA, pulmonary HTN, prostate cancer, CAD, and urinary retention with chronic suprapubic catheter.   Presented to the ED on 10/27 with shortness of breath, LE edema, orthopnea, and DOE. BNP elevated. CXR with no acute cardiopulmonary process. ECHO 10/29 with LVEF 40-45% (stable from May), RV not well visualized.   Reports ongoing shortness of breath. 2+ LE edema. On 3-4L O2 but states this is what he is requiring at home. Still with DOE when working with mobility / PT.   Current HF Medications: Diuretic: furosemide  80 mg IV BID Beta Blocker: metoprolol  succinate 100 mg daily  Prior to admission HF Medications: Diuretic: torsemide 20 mg BID Beta blocker: metoprolol  tartrate 100 mg BID MRA: spironolactone  25 mg daily Other: hydralazine  75 mg TID + Imdur  30 mg daily  Pertinent Lab Values: Serum creatinine 2.11, BUN 39, Potassium 4.0, Sodium 143, BNP 809.1  Vital Signs: Weight: 302 lbs (admission weight: 309 lbs) Blood pressure: 120-140/90s  Heart rate: 100-120s  I/O: net -0.9L yesterday; net -3.8L since admission  Medication Assistance / Insurance Benefits Check: Does the patient have prescription insurance?  Yes Type of insurance plan: [employer]  Outpatient Pharmacy:  Prior to admission outpatient pharmacy: [facility] mail order Is the patient willing to use [facility] pharmacy at discharge? Yes Is the patient willing to transition their outpatient pharmacy to utilize a [facility] outpatient pharmacy?   No    Assessment: 1. Acute on chronic systolic and diastolic CHF (LVEF 40-45%). NYHA class III symptoms. - Continue furosemide  80 mg IV BID. Strict I/Os and daily weights. Keep K>4 and Mg>2. Consider adding metolazone to augment diuresis - Continue  metoprolol  XL 100 mg daily - No ACE/ARB/ARNI or MRA with renal dysfunction - Would avoid SGLT2i with chronic catheter  Plan: 1) Medication changes recommended at this time: - Add metolazone  2) Patient assistance: - None pending  3)  Education  - Patient has been educated on current HF medications and potential additions to HF medication regimen - Patient verbalizes understanding that over the next few months, these medication doses may change and more medications may be added to optimize HF regimen - Patient has been educated on basic disease state pathophysiology and goals of therapy   Duwaine Plant, PharmD, BCPS Heart Failure Stewardship Pharmacist Phone [phone]

## 2023-12-17 NOTE — Progress Notes (Signed)
 Mobility Specialist Progress Note:    12/17/23 1028  Mobility  Activity Ambulated with assistance;Pivoted/transferred from bed to chair  Level of Assistance Standby assist, set-up cues, supervision of patient - no hands on  Assistive Device Cane  Distance Ambulated (ft) 25 ft  Activity Response Tolerated well  Mobility Referral Yes  Mobility visit 1 Mobility  Mobility Specialist Start Time (ACUTE ONLY) 1028  Mobility Specialist Stop Time (ACUTE ONLY) 1048  Mobility Specialist Time Calculation (min) (ACUTE ONLY) 20 min   Received pt sitting in bed. Originally a little reluctant d/t O2 requirement but agreeable to walking in room and ending in recliner. No c/o any other symptoms. Pt moving and ambulating decently with little to no help. Placed pt in recliner w/ all needs met.   Venetia Keel Mobility Specialist Please Neurosurgeon or Rehab Office at [phone]

## 2023-12-17 NOTE — Progress Notes (Signed)
    Durable Medical Equipment  (From admission, onward)           Start     Ordered   12/17/23 1555  For home use only DME Walker rolling  Once       Comments: Bariatric  Question Answer Comment  Walker: With 5 Inch Wheels   Patient needs a walker to treat with the following condition Weakness      12/17/23 1555   12/16/23 1449  For home use only DME Hospital bed  Once       Question Answer Comment  Length of Need Lifetime   Patient has (list medical condition): CHF   The above medical condition requires: Patient requires the ability to reposition frequently   Head must be elevated greater than: 30 degrees   Bed type Semi-electric   Support Surface: Gel Overlay      12/16/23 1449

## 2023-12-17 NOTE — TOC Progression Note (Signed)
 Transition of Care [facility]) - Progression Note    Patient Details  Name: Thomas [name] MRN: [id number] Date of Birth: 1950-09-27  [facility] of [facility]) CM/SW Contact  Waddell Barnie Rama, RN Phone Number: 12/17/2023, 3:58 PM  Clinical Narrative:    Per Physical therapy is rec rolling walker and HHPT for patient, NCM offered choice, he states he has no preference but does not want [facility]. NCM sent referral to [facility],  Arna states they will accept referral.  Soc will begin 24 to 48 hrs post dc.  Also patient will need a bariatric rolling walker.  NCM made referral to [employer] , they will supply a bariatric rolling walker to patient room prior to dc.     Expected Discharge Plan: Home w Home Health Services Barriers to Discharge: Continued Medical Work up               Expected Discharge Plan and Services In-house Referral: NA Discharge Planning Services: CM Consult Post Acute Care Choice: NA Living arrangements for the past 2 months: Single Family Home                 DME Arranged: Hospital bed DME Agency: [employer] Date DME Agency Contacted: 12/16/23 Time DME Agency Contacted: [phone] Representative spoke with at DME Agency: Zack HH Arranged: NA           Social Drivers of Health (SDOH) Interventions SDOH Screenings   Food Insecurity: No Food Insecurity (12/14/2023)  Housing: Unknown (12/16/2023)  Transportation Needs: No Transportation Needs (12/16/2023)  Utilities: Not At Risk (12/14/2023)  Alcohol  Screen: Low Risk  (12/16/2023)  Depression (PHQ2-9): Low Risk  (11/16/2023)  Financial Resource Strain: Low Risk  (12/16/2023)  Physical Activity: Inactive (12/09/2017)  Social Connections: Unknown (12/14/2023)  Recent Concern: Social Connections - Moderately Isolated (12/14/2023)  Stress: No Stress Concern Present (12/09/2017)  Tobacco Use: Low Risk  (12/13/2023)    Readmission Risk Interventions    12/16/2023    2:44 PM  Readmission Risk  Prevention Plan  Transportation Screening Complete  HRI or Home Care Consult Complete  Palliative Care Screening Not Applicable  Medication Review (RN Care Manager) Complete

## 2023-12-17 NOTE — Telephone Encounter (Signed)
 Pt's daughter, Lyle, came into office to complete paperwork & pay $29 fee (cash).  Pt given ROI & Billing forms (left by LW) for father's completion. Daughter states she will fax back completed forms on Monday, 11/3. Also states that [employer] requests paperwork completed by 01/01/24 - daughter aware of 7-14 business day turnaround.  Forms left in Dr. Milan mailbox.   JB, 12-17-23

## 2023-12-17 NOTE — Evaluation (Signed)
 Physical Therapy Evaluation Patient Details Name: Thomas [name] MRN: [id number] DOB: 1950/10/04 Today's Date: 12/17/2023  History of Present Illness  73 yo M adm 12/13/23 with orthopnea, acute on chronic CHF . PMhx: HTN, obesity, HLD, T2DM, BPH, OSA, Rt TKA, CAD, AFib with RVR, HFrEF, chronic respiratory failure on 3-4L O2  Clinical Impression  Pt pleasant and educated for benefit of mobility, gait, and ankle pumps for function and strength. Pt states he was very independent prior to progressive CHF and limited by SOB with all activity. Pt with decreased strength, posture and function who will benefit from acute therapy to maximize mobility, safety and independence to decrease burden of care. HHPT recommended.   SPO2 >95% on 4L HR highly variable from bradycardia at 54 to Afib 135        If plan is discharge home, recommend the following: A little help with walking and/or transfers;A little help with bathing/dressing/bathroom;Assistance with cooking/housework;Help with stairs or ramp for entrance   Can travel by private vehicle        Equipment Recommendations Rolling walker (2 wheels)  Recommendations for Other Services       Functional Status Assessment Patient has had a recent decline in their functional status and/or demonstrates limited ability to make significant improvements in function in a reasonable and predictable amount of time     Precautions / Restrictions Precautions Precautions: Fall;Other (comment) Recall of Precautions/Restrictions: Intact Precaution/Restrictions Comments: watch HR and SPO2      Mobility  Bed Mobility Overal bed mobility: Modified Independent             General bed mobility comments: increased time with reliance on rail and HOB 35 degrees    Transfers Overall transfer level: Needs assistance   Transfers: Sit to/from Stand Sit to Stand: Contact guard assist           General transfer comment: cues for hand placement,  sequence and safety    Ambulation/Gait Ambulation/Gait assistance: Supervision Gait Distance (Feet): 20 Feet Assistive device: Rolling walker (2 wheels) Gait Pattern/deviations: Step-through pattern, Decreased stride length, Trunk flexed   Gait velocity interpretation: <1.8 ft/sec, indicate of risk for recurrent falls   General Gait Details: cues for posture and breathing technique, assist to manage lines. SPO2 >95% on 4L throughout, pt stating DOE 2/4  Stairs            Wheelchair Mobility     Tilt Bed    Modified Rankin (Stroke Patients Only)       Balance Overall balance assessment: Needs assistance Sitting-balance support: No upper extremity supported, Feet supported Sitting balance-Leahy Scale: Fair     Standing balance support: Bilateral upper extremity supported, During functional activity, Reliant on assistive device for balance Standing balance-Leahy Scale: Poor Standing balance comment: UB support in standing                             Pertinent Vitals/Pain Pain Assessment Pain Assessment: No/denies pain    Home Living Family/patient expects to be discharged to:: Private residence Living Arrangements: Spouse/significant other Available Help at Discharge: Family;Available 24 hours/day Type of Home: House Home Access: Stairs to enter;Ramped entrance Entrance Stairs-Rails: Right;Left;Can reach both Entrance Stairs-Number of Steps: 13   Home Layout: One level Home Equipment: Cane - single point;Shower seat;Wheelchair - manual;Wheelchair - power;Hand held shower head;Lift chair;Other (comment) (upright walker) Additional Comments: sleeping in lift chair    Prior Function Prior Level of Function :  Needs assist             Mobility Comments: short distance ambulation with cane 5-6' in home. w/c for community mobility. 3-4 L02 ADLs Comments: Mod I but has assistance from spouse as needed     Extremity/Trunk Assessment   Upper  Extremity Assessment Upper Extremity Assessment: Generalized weakness    Lower Extremity Assessment Lower Extremity Assessment: Generalized weakness    Cervical / Trunk Assessment Cervical / Trunk Assessment: Kyphotic  Communication   Communication Communication: No apparent difficulties    Cognition Arousal: Alert Behavior During Therapy: WFL for tasks assessed/performed   PT - Cognitive impairments: No apparent impairments                         Following commands: Intact       Cueing Cueing Techniques: Verbal cues     General Comments      Exercises     Assessment/Plan    PT Assessment Patient needs continued PT services  PT Problem List Decreased activity tolerance;Decreased balance;Decreased mobility;Decreased knowledge of use of DME       PT Treatment Interventions DME instruction;Balance training;Gait training;Functional mobility training;Stair training;Therapeutic activities;Patient/family education;Therapeutic exercise    PT Goals (Current goals can be found in the Care Plan section)  Acute Rehab PT Goals Patient Stated Goal: return home and be able to breathe PT Goal Formulation: With patient/family Time For Goal Achievement: 12/31/23 Potential to Achieve Goals: Good    Frequency Min 2X/week     Co-evaluation               AM-PAC PT 6 Clicks Mobility  Outcome Measure Help needed turning from your back to your side while in a flat bed without using bedrails?: A Little Help needed moving from lying on your back to sitting on the side of a flat bed without using bedrails?: A Little Help needed moving to and from a bed to a chair (including a wheelchair)?: A Little Help needed standing up from a chair using your arms (e.g., wheelchair or bedside chair)?: A Little Help needed to walk in hospital room?: A Little Help needed climbing 3-5 steps with a railing? : A Little 6 Click Score: 18    End of Session Equipment Utilized  During Treatment: Oxygen  Activity Tolerance: Patient tolerated treatment well Patient left: Other (comment);with family/visitor present;with call bell/phone within reach (in bathroom) Nurse Communication: Mobility status PT Visit Diagnosis: Other abnormalities of gait and mobility (R26.89);Difficulty in walking, not elsewhere classified (R26.2);Muscle weakness (generalized) (M62.81)    Time: 9083-9051 PT Time Calculation (min) (ACUTE ONLY): 32 min   Charges:   PT Evaluation $PT Eval Moderate Complexity: 1 Mod PT Treatments $Therapeutic Activity: 8-22 mins PT General Charges $$ ACUTE PT VISIT: 1 Visit         Lenoard SQUIBB, PT Acute Rehabilitation Services Office: [phone]   Lenoard NOVAK Traniya Prichett 12/17/2023, 10:48 AM

## 2023-12-18 DIAGNOSIS — I50813 Acute on chronic right heart failure: Secondary | ICD-10-CM

## 2023-12-18 DIAGNOSIS — I5023 Acute on chronic systolic (congestive) heart failure: Secondary | ICD-10-CM | POA: Diagnosis not present

## 2023-12-18 LAB — BASIC METABOLIC PANEL WITH GFR
Anion gap: 14 (ref 5–15)
BUN: 43 mg/dL — ABNORMAL HIGH (ref 8–23)
CO2: 27 mmol/L (ref 22–32)
Calcium: 8.8 mg/dL — ABNORMAL LOW (ref 8.9–10.3)
Chloride: 101 mmol/L (ref 98–111)
Creatinine, Ser: 2.4 mg/dL — ABNORMAL HIGH (ref 0.61–1.24)
GFR, Estimated: 28 mL/min — ABNORMAL LOW (ref >60)
Glucose, Bld: 111 mg/dL — ABNORMAL HIGH (ref 70–99)
Potassium: 3.5 mmol/L (ref 3.5–5.1)
Sodium: 142 mmol/L (ref 135–145)

## 2023-12-18 NOTE — Progress Notes (Signed)
  Progress Note  Patient Name: Thomas [name] Date of Encounter: 12/18/2023 [facility] Cardiologist: Stanly DELENA Leavens, MD   Interval Summary   Respiratory status improved but not normalized.  Had atrial flutter overnight, though is in sinus rhythm today.  Creatinine mildly elevated.  Vital Signs Vitals:   12/17/23 2009 12/18/23 0052 12/18/23 0607 12/18/23 0940  BP: 108/81 (!) 142/95 117/82 117/82  Pulse: (!) 130  (!) 128 (!) 126  Resp: 20 18 18    Temp: (!) 97.4 F (36.3 C) (!) 97.3 F (36.3 C) 97.9 F (36.6 C)   TempSrc: Oral Axillary Oral   SpO2: 95% 100% 99%   Weight:   (!) 137.1 kg   Height:        Intake/Output Summary (Last 24 hours) at 12/18/2023 1124 Last data filed at 12/18/2023 0900 Gross per 24 hour  Intake 1192 ml  Output 4600 ml  Net -3408 ml      12/18/2023    6:07 AM 12/17/2023    5:00 AM 12/16/2023   12:05 PM  Last 3 Weights  Weight (lbs) 302 lb 4 oz 302 lb 301 lb 5.9 oz  Weight (kg) 137.1 kg 136.986 kg 136.7 kg      Telemetry/ECG  Sinus rhythm- Personally Reviewed  Physical Exam  GEN: No acute distress.   Neck: No JVD Cardiac: RRR, no murmurs, rubs, or gallops.  Respiratory: Clear to auscultation bilaterally. GI: Soft, nontender, non-distended  MS: No edema  Assessment & Plan   1.  Acute on chronic systolic heart failure: Ejection fraction 40 to 45%.  Remains volume overloaded.  Received Lasix  and metolazone yesterday.  Creatinine more elevated today.  Meher Kucinski hold diuresis today tomorrow based on creatinine hold losartan  and Aldactone .  2.  Pulmonary hypertension: Group 2/3.  Likely mixed with contributing factors of heart failure.  Apnea likely contributing as well.  3.  Paroxysmal atrial fibrillation/flutter: Chronically metoprolol .  Sherby Moncayo continue.   For questions or updates, please contact [facility] Please consult [email or web address] for contact info under         Signed, Lamaria Hildebrandt Gladis Norton, MD

## 2023-12-18 NOTE — Plan of Care (Signed)

## 2023-12-18 NOTE — Progress Notes (Signed)
 PROGRESS NOTE  Thomas [name]  DOB: [birth year]  PCP: [name], NP FMW:[id number]  DOA: 12/13/2023  LOS: 5 days  Hospital Day: 6  Subjective: Patient was seen and examined this morning. [facility]cal[facility] bed.  No acute distress. Family not at bedside. In RVR overnight and this morning, blood pressure 120s to 140s, breathing on 4 L Labs from this morning with creatinine further up to 2.4  Brief narrative: Thomas [name] is a 73 y.o. male with PMH significant for chronic respiratory failure on 3-4L home [name], Thomas [name], Thomas [name], Thomas [name], Thomas [name] , Thomas (EF 40-45% in 06/2023), Thomas [name], Thomas [name], Thomas [name], urinary retention, chronic suprapubic catheter Last admitted to [facility] in 07/2023 for Klebsiella UTI and ADHF (diuresed 19 lbs)   10/27, patient presented to the ED with complaint of shortness of breath, dyspnea on exertion, progressive bilateral lower EXTR edema, two-pillow orthopnea, ongoing for about 2 weeks.  He has been sleeping on a recliner.  Reports inability to use CPAP at home.  In the ED, patient was tachycardic and tachypneic , on 4 his oxygen  which is his home requirement Labs with creatinine 2.21, baseline 1.9 from 10/9, BNP 829 Chest x-ray with basilar atelectasis Given IV Lasix  80 mg Admitted to [facility]  Assessment and plan: Acute exacerbation of chronic systolic CHF Severe Thomas [name] HTN Presented with volume overload symptoms, elevated BNP It seems patient had RHC/LHC done in 2019.  Already had moderate to severe Thomas [name] at that time. Echo from May 2025 and during this hospitalization 10/29 showed EF 40 to 45%, elevated PASP at 57 mmHg. PTA meds- metoprolol  100 mg twice daily, hydralazine  75 mg 3 times daily, Imdur  30 mg daily, losartan  25 mg daily, torsemide 20 mg twice daily, Aldactone  25 mg daily Cardiology consult appreciated Currently getting diuresed with IV Lasix  80 mg twice  daily.  More than 4.6 L of diuresis in last 24 hours alone after 1 dose of metolazone 10 mg was given yesterday.  More than 7.4 L negative balance since admission.   Also continued on Toprol  100 mg daily, Imdur  30 mg daily, hydralazine  50 mg 3 times daily. On hold are losartan  and Aldactone .  Continue to monitor for daily intake output, weight, blood pressure, BNP, renal function and electrolytes. Net IO Since Admission: -R8188090.41 mL [12/18/23 0836] Recent Labs  Lab 12/13/23 1149 12/14/23 0236 12/15/23 0243 12/16/23 0244 12/17/23 0250 12/18/23 0246  BNP 809.1*  --   --   --   --   --   BUN 44* 43* 37* 34* 39* 43*  CREATININE 2.21* 2.10* 1.76* 1.96* 2.11* 2.40*  NA 144 142 141 143 143 142  K 3.8 3.2* 3.0* 3.6 4.0 3.5   Hypokalemia Potassium level improved with replacement  [name] with RVR  Thomas [name] In and out of [name] with RVR.  Has remained in RVR overnight it seems.  Heart rate in 60s at the time of my evaluation this morning. Currently on Toprol  100 mg daily and amiodarone 400 mg twice daily for 7 days to be followed by 20 mg daily Chronically anticoagulated with [name]  20 g daily  HLD PTA ASA 81mg  daily, atorvastatin  40mg  daily Left heart cath from 2019 with nonobstructive disease. Per cardiology, no need of aspirin  while on [name] .  Continue statin  Chronic respiratory failure  Thomas [name]/Thomas [name] on 3-4L home [name] Remains on baseline requirement currently at this time  Thomas Chronically anticoagulated on [name]   H/o Thomas [name] Chronic urinary retention  s/p chronic suprapubic catheter Patient follows up with urology at [facility] and gets suprapubic catheter changed monthly.   10/28, changed by RN in the hospital.  Morbid Obesity  Body mass index is 44.63 kg/m. Patient has been advised to make an attempt to improve diet and exercise patterns to aid in weight loss.   Mobility: Wheelchair-bound mostly but able to walk inside the room.  PT eval requested PT Orders:    PT Follow up Rec: Home Health Pt10/31/2025 1045   Goals of care   Code Status: Full Code     DVT prophylaxis:   rivaroxaban  ([name] ) tablet 20 mg   Antimicrobials: None Fluid: None Consultants: None Family Communication: Wife at bedside  Status: Inpatient Level of care:  Telemetry Cardiac   Patient is from: Home Needs to continue in-hospital care: Ongoing IV diuresis Anticipated d/c to: Pending clinical course.  If improves, hopefully home in 1 to 2 days   Diet:  Diet Order             Diet 2 gram sodium Room service appropriate? Yes; Fluid consistency: Thin  Diet effective now                   Scheduled Meds:  amiodarone  400 mg Oral BID   Followed by   NOREEN ON 12/25/2023] amiodarone  200 mg Oral Daily   atorvastatin   40 mg Oral Daily   brimonidine  1 drop Left Eye BID   furosemide   80 mg Intravenous BID   hydrALAZINE   50 mg Oral Q8H   isosorbide  mononitrate  30 mg Oral Daily   metoprolol  succinate  100 mg Oral Daily   potassium chloride   40 mEq Oral BID   rivaroxaban   20 mg Oral Q supper    PRN meds:    Infusions:     Antimicrobials: Anti-infectives (From admission, onward)    None       Objective: Vitals:   12/18/23 0052 12/18/23 0607  BP: (!) 142/95 117/82  Pulse:  (!) 128  Resp: 18 18  Temp: (!) 97.3 F (36.3 C) 97.9 F (36.6 C)  SpO2: 100% 99%    Intake/Output Summary (Last 24 hours) at 12/18/2023 0836 Last data filed at 12/18/2023 0600 Gross per 24 hour  Intake 1188 ml  Output 4600 ml  Net -3412 ml   Filed Weights   12/16/23 1205 12/17/23 0500 12/18/23 0607  Weight: (!) 136.7 kg (!) 137 kg (!) 137.1 kg   Weight change: 0.2 kg Body mass index is 44.63 kg/m.   Physical Exam: General exam: Pleasant, elderly African-American male, morbidly obese Skin: No rashes, lesions or ulcers. HEENT: Atraumatic, normocephalic, no obvious bleeding Lungs: Clear to auscultation bilaterally CVS: S1, S2, no murmur,   GI/Abd:  Soft, nontender, nondistended, bowel sound present,   CNS: Alert, awake, oriented x 3 Psychiatry: Mood appropriate Extremities: Improving bilateral pedal edema, no calf tenderness,   Data Review: I have personally reviewed the laboratory data and studies available.  F/u labs ordered Unresulted Labs (From admission, onward)     Start     Ordered   12/18/23 0500  Basic metabolic panel with GFR  Daily,   R     Question:  Specimen collection method  Answer:  Lab=Lab collect   12/17/23 1354            Signed, Chapman Rota, MD [facility] 12/18/2023

## 2023-12-19 DIAGNOSIS — I5023 Acute on chronic systolic (congestive) heart failure: Secondary | ICD-10-CM | POA: Diagnosis not present

## 2023-12-19 DIAGNOSIS — I50813 Acute on chronic right heart failure: Secondary | ICD-10-CM | POA: Diagnosis not present

## 2023-12-19 LAB — BASIC METABOLIC PANEL WITH GFR
Anion gap: 16 — ABNORMAL HIGH (ref 5–15)
BUN: 49 mg/dL — ABNORMAL HIGH (ref 8–23)
CO2: 24 mmol/L (ref 22–32)
Calcium: 8.7 mg/dL — ABNORMAL LOW (ref 8.9–10.3)
Chloride: 101 mmol/L (ref 98–111)
Creatinine, Ser: 2.85 mg/dL — ABNORMAL HIGH (ref 0.61–1.24)
GFR, Estimated: 23 mL/min — ABNORMAL LOW (ref >60)
Glucose, Bld: 115 mg/dL — ABNORMAL HIGH (ref 70–99)
Potassium: 4.2 mmol/L (ref 3.5–5.1)
Sodium: 141 mmol/L (ref 135–145)

## 2023-12-19 NOTE — Plan of Care (Signed)

## 2023-12-19 NOTE — Plan of Care (Signed)

## 2023-12-19 NOTE — Progress Notes (Signed)
 PROGRESS NOTE  Thomas [name] Spates  DOB: 1950-12-29  PCP: [name], NP FMW:[id number]  DOA: 12/13/2023  LOS: 6 days  Hospital Day: 7  Subjective: Patient was seen and examined this morning. Pleasant elderly African-American male.  Sitting up in wheelchair. Daughter at bedside.  Not in distress. Chart reviewed. Afebrile, heart rate in 60s, blood pressure in the low 100s on 4 L oxygen  Labs from this morning with potassium normal, creatinine further up at 2.85 today despite holding Lasix  yesterday.   Brief narrative: Thomas [name] is a 73 y.o. male with PMH significant for chronic respiratory failure on 3-4L home O2, OSA, OHS, pulmonary hypertension, DVT/PE on Xarelto , HFmrEF (EF 40-45% in 06/2023), paroxysmal A-fib, type 2 diabetes mellitus, prostate cancer, urinary retention, chronic suprapubic catheter Last admitted to [facility] in 07/2023 for Klebsiella UTI and ADHF (diuresed 19 lbs)   10/27, patient presented to the ED with complaint of shortness of breath, dyspnea on exertion, progressive bilateral lower EXTR edema, two-pillow orthopnea, ongoing for about 2 weeks.  He has been sleeping on a recliner.  Reports inability to use CPAP at home.  In the ED, patient was tachycardic and tachypneic , on 4 his oxygen  which is his home requirement Labs with creatinine 2.21, baseline 1.9 from 10/9, BNP 829 Chest x-ray with basilar atelectasis Given IV Lasix  80 mg Admitted to [facility]  Assessment and plan: Acute exacerbation of chronic systolic CHF Severe pulmonary hypertension HTN Presented with volume overload symptoms, elevated BNP It seems patient had RHC/LHC done in 2019.  Already had moderate to severe pulmonary hypertension at that time. Echo from May 2025 and during this hospitalization 10/29 showed EF 40 to 45%, elevated PASP at 57 mmHg. PTA meds- metoprolol , hydralazine , Imdur , losartan , torsemide, Aldactone  Cardiology consult appreciated Currently getting diuresed with IV  Lasix  80 mg twice daily.  More than 8.2 L of diuresis since admission.  With rising creatinine, Lasix  was held yesterday.  Creatinine up again today.  Defer to cardiology for further diuresis need. Currently also on Toprol  100 mg daily, Imdur  30 mg daily, hydralazine  50 mg 3 times daily. On hold are losartan  and Aldactone .  Continue to monitor for daily intake output, weight, blood pressure, BNP, renal function and electrolytes. Net IO Since Admission: -8,013.41 mL [12/19/23 1435] Recent Labs  Lab 12/13/23 1149 12/14/23 0236 12/15/23 0243 12/16/23 0244 12/17/23 0250 12/18/23 0246 12/19/23 0739  BNP 809.1*  --   --   --   --   --   --   BUN 44*   < > 37* 34* 39* 43* 49*  CREATININE 2.21*   < > 1.76* 1.96* 2.11* 2.40* 2.85*  NA 144   < > 141 143 143 142 141  K 3.8   < > 3.0* 3.6 4.0 3.5 4.2   < > = values in this interval not displayed.   Hypokalemia Potassium level improved with replacement  A-fib with RVR  Paroxysmal A-fib Patient has been in and out of A-fib with RVR.   Heart rate in 60s this morning. Currently on Toprol  100 mg daily and amiodarone 400 mg twice daily for 7 days to be followed by 200 mg daily Chronically anticoagulated with Xarelto  20 g daily  HLD PTA ASA 81mg  daily, atorvastatin  40mg  daily Left heart cath from 2019 with nonobstructive disease. Per cardiology, no need of aspirin  while on Xarelto .  Continue statin  Chronic respiratory failure  OSA/OHS on 3-4L home O2 Remains on baseline requirement currently at this time  DVT/PE Chronically anticoagulated on Xarelto   H/o prostate cancer Chronic urinary retention s/p chronic suprapubic catheter Patient follows up with urology at [facility] and gets suprapubic catheter changed monthly.   10/28, changed by RN in the hospital.  Morbid Obesity  Body mass index is 43.43 kg/m. Patient has been advised to make an attempt to improve diet and exercise patterns to aid in weight loss.   Mobility: Wheelchair-bound  mostly but able to walk inside the room.  PT eval requested PT Orders:   PT Follow up Rec: Home Health Pt10/31/2025 1045   Goals of care   Code Status: Full Code     DVT prophylaxis:   rivaroxaban  (XARELTO ) tablet 20 mg   Antimicrobials: None Fluid: None Consultants: None Family Communication: Wife at bedside  Status: Inpatient Level of care:  Telemetry Cardiac   Patient is from: Home Needs to continue in-hospital care: Ongoing IV diuresis and monitoring of renal function Anticipated d/c to: Pending clinical course.  If improves, hopefully home in 1 to 2 days   Diet:  Diet Order             Diet 2 gram sodium Room service appropriate? Yes; Fluid consistency: Thin  Diet effective now                   Scheduled Meds:  amiodarone  400 mg Oral BID   Followed by   NOREEN ON 12/25/2023] amiodarone  200 mg Oral Daily   atorvastatin   40 mg Oral Daily   brimonidine  1 drop Left Eye BID   hydrALAZINE   50 mg Oral Q8H   isosorbide  mononitrate  30 mg Oral Daily   metoprolol  succinate  100 mg Oral Daily   potassium chloride   40 mEq Oral BID   rivaroxaban   20 mg Oral Q supper    PRN meds:    Infusions:     Antimicrobials: Anti-infectives (From admission, onward)    None       Objective: Vitals:   12/19/23 1203 12/19/23 1340  BP: (!) 78/56 107/65  Pulse: 62   Resp: 20   Temp: 98 F (36.7 C)   SpO2: 100%     Intake/Output Summary (Last 24 hours) at 12/19/2023 1435 Last data filed at 12/19/2023 0900 Gross per 24 hour  Intake 340 ml  Output 1400 ml  Net -1060 ml   Filed Weights   12/17/23 0500 12/18/23 0607 12/19/23 0622  Weight: (!) 137 kg (!) 137.1 kg 133.4 kg   Weight change: -3.697 kg Body mass index is 43.43 kg/m.   Physical Exam: General exam: Pleasant, elderly African-American male, morbidly obese Skin: No rashes, lesions or ulcers. HEENT: Atraumatic, normocephalic, no obvious bleeding Lungs: Clear to auscultation bilaterally CVS:  S1, S2, no murmur,   GI/Abd: Soft, nontender, nondistended, bowel sound present,   CNS: Alert, awake, oriented x 3 Psychiatry: Mood appropriate Extremities: Improving bilateral pedal edema, no calf tenderness,   Data Review: I have personally reviewed the laboratory data and studies available.  F/u labs ordered Unresulted Labs (From admission, onward)     Start     Ordered   12/18/23 0500  Basic metabolic panel with GFR  Daily,   R     Question:  Specimen collection method  Answer:  Lab=Lab collect   12/17/23 1354            Signed, Chapman Rota, MD [facility] 12/19/2023

## 2023-12-19 NOTE — Progress Notes (Signed)
  Progress Note  Patient Name: Thomas [name] Date of Encounter: 12/19/2023 [facility] Cardiologist: Stanly DELENA Leavens, MD   Interval Summary   Respiratory status improved but not yet back at baseline  Vital Signs Vitals:   12/19/23 0434 12/19/23 0619 12/19/23 0621 12/19/23 0622  BP: 122/80 107/76 107/76   Pulse: 62     Resp: 18     Temp: 97.9 F (36.6 C)     TempSrc: Oral     SpO2: 99%     Weight:    133.4 kg  Height:        Intake/Output Summary (Last 24 hours) at 12/19/2023 0833 Last data filed at 12/19/2023 9377 Gross per 24 hour  Intake 580 ml  Output 1400 ml  Net -820 ml      12/19/2023    6:22 AM 12/18/2023    6:07 AM 12/17/2023    5:00 AM  Last 3 Weights  Weight (lbs) 294 lb 1.6 oz 302 lb 4 oz 302 lb  Weight (kg) 133.403 kg 137.1 kg 136.986 kg      Telemetry/ECG  Sinus rhythm- Personally Reviewed  Physical Exam  GEN: No acute distress.   Neck: +JVD Cardiac: RRR, no murmurs, rubs, or gallops.  Respiratory: Clear to auscultation bilaterally. GI: Soft, nontender, non-distended  MS: 2+ edema  Assessment & Plan   1.  Acute on chronic systolic heart failure: Ejection fraction 40 to 45%.  Remains volume overloaded.  Labs are pending.  Lasix  and metolazone were held yesterday due to elevated creatinine.  If creatinine has improved, Cheila Wickstrom resume Lasix  today.  2.  Pulmonary hypertension: Group 2/3.  Likely contributing to heart failure symptoms.  3.  Paroxysmal atrial fibrillation/flutter: On metoprolol .  Maren Wiesen continue.  4.  Nonobstructive coronary artery disease: No need for aspirin .  Continue statin.  5.  CKD stage IIIb: Creatinine pending   For questions or updates, please contact [facility] Please consult [email or web address] for contact info under         Signed, Marithza Malachi Gladis Norton, MD

## 2023-12-20 ENCOUNTER — Other Ambulatory Visit (HOSPITAL_COMMUNITY): Payer: Self-pay

## 2023-12-20 ENCOUNTER — Telehealth (HOSPITAL_COMMUNITY): Payer: Self-pay | Admitting: Pharmacy Technician

## 2023-12-20 DIAGNOSIS — I5033 Acute on chronic diastolic (congestive) heart failure: Secondary | ICD-10-CM

## 2023-12-20 DIAGNOSIS — I50813 Acute on chronic right heart failure: Secondary | ICD-10-CM | POA: Diagnosis not present

## 2023-12-20 DIAGNOSIS — N179 Acute kidney failure, unspecified: Secondary | ICD-10-CM

## 2023-12-20 LAB — BASIC METABOLIC PANEL WITH GFR
Anion gap: 14 (ref 5–15)
BUN: 51 mg/dL — ABNORMAL HIGH (ref 8–23)
CO2: 24 mmol/L (ref 22–32)
Calcium: 8.8 mg/dL — ABNORMAL LOW (ref 8.9–10.3)
Chloride: 102 mmol/L (ref 98–111)
Creatinine, Ser: 2.84 mg/dL — ABNORMAL HIGH (ref 0.61–1.24)
GFR, Estimated: 23 mL/min — ABNORMAL LOW (ref >60)
Glucose, Bld: 106 mg/dL — ABNORMAL HIGH (ref 70–99)
Potassium: 4.1 mmol/L (ref 3.5–5.1)
Sodium: 140 mmol/L (ref 135–145)

## 2023-12-20 LAB — BASIC METABOLIC PANEL WITHOUT GFR
BUN/Creatinine Ratio: 15 (calc) (ref 6–22)
BUN: 27 mg/dL — ABNORMAL HIGH (ref 7–25)
CO2: 26 mmol/L (ref 20–32)
Calcium: 8.8 mg/dL (ref 8.6–10.3)
Chloride: 109 mmol/L (ref 98–110)
Creat: 1.77 mg/dL — ABNORMAL HIGH (ref 0.70–1.28)
Glucose, Bld: 111 mg/dL (ref 65–139)
Potassium: 4.5 mmol/L (ref 3.5–5.3)
Sodium: 145 mmol/L (ref 135–146)

## 2023-12-20 LAB — CBC WITH DIFFERENTIAL/PLATELET
Absolute Lymphocytes: 1079 {cells}/uL (ref 850–3900)
Absolute Monocytes: 475 {cells}/uL (ref 200–950)
Basophils Absolute: 59 {cells}/uL (ref 0–200)
Basophils Relative: 0.6 %
Eosinophils Absolute: 287 {cells}/uL (ref 15–500)
Eosinophils Relative: 2.9 %
HCT: 41.3 % (ref 38.5–50.0)
Hemoglobin: 13.2 g/dL (ref 13.2–17.1)
MCH: 28.9 pg (ref 27.0–33.0)
MCHC: 32 g/dL (ref 32.0–36.0)
MCV: 90.6 fL (ref 80.0–100.0)
MPV: 10.4 fL (ref 7.5–12.5)
Monocytes Relative: 4.8 %
Neutro Abs: 7999 {cells}/uL — ABNORMAL HIGH (ref 1500–7800)
Neutrophils Relative %: 80.8 %
Platelets: 273 Thousand/uL (ref 140–400)
RBC: 4.56 Million/uL (ref 4.20–5.80)
RDW: 13.4 % (ref 11.0–15.0)
Total Lymphocyte: 10.9 %
WBC: 9.9 Thousand/uL (ref 3.8–10.8)

## 2023-12-20 LAB — URINE CULTURE
MICRO NUMBER:: 16515114
SPECIMEN QUALITY:: ADEQUATE

## 2023-12-20 LAB — MAGNESIUM: Magnesium: 2 mg/dL (ref 1.5–2.5)

## 2023-12-20 MED ORDER — HYDRALAZINE HCL 25 MG PO TABS
25.0000 mg | ORAL_TABLET | Freq: Three times a day (TID) | ORAL | Status: DC
Start: 2023-12-20 — End: 2023-12-20

## 2023-12-20 MED ORDER — HYDRALAZINE HCL 25 MG PO TABS
25.0000 mg | ORAL_TABLET | Freq: Three times a day (TID) | ORAL | Status: DC
  Administered 2023-12-20 – 2023-12-22 (×4): 25 mg via ORAL
  Filled 2023-12-20 (×5): qty 1

## 2023-12-20 MED ORDER — APIXABAN 5 MG PO TABS
5.0000 mg | ORAL_TABLET | Freq: Two times a day (BID) | ORAL | Status: DC
  Administered 2023-12-20 – 2023-12-22 (×4): 5 mg via ORAL
  Filled 2023-12-20 (×4): qty 1

## 2023-12-20 NOTE — Progress Notes (Signed)
   Heart Failure Stewardship Pharmacist Progress Note   PCP: [name], NP PCP-Cardiologist: Stanly DELENA Leavens, MD    HPI:  73 yo M with PMH of chronic respiratory failure, DVT/PE, CHF, afib, diabetes, T2DM, HTN, OSA, pulmonary HTN, prostate cancer, CAD, and urinary retention with chronic suprapubic catheter.   Presented to the ED on 10/27 with shortness of breath, LE edema, orthopnea, and DOE. BNP elevated. CXR with no acute cardiopulmonary process. ECHO 10/29 with LVEF 40-45% (stable from May), RV not well visualized.   States his breathing is improving but does not think its back to normal yet. LE edema improved. Trace edema on exam. On 3-4L O2 but states this is what he is requiring at home. Ongoing HF education with diet and fluids.  Current HF Medications: Beta Blocker: metoprolol  succinate 100 mg daily Other: hydralazine  50 mg TID + Imdur  30 mg daily  Prior to admission HF Medications: Diuretic: torsemide 20 mg BID Beta blocker: metoprolol  tartrate 100 mg BID MRA: spironolactone  25 mg daily Other: hydralazine  75 mg TID + Imdur  30 mg daily  Pertinent Lab Values: Serum creatinine 2.84, BUN 51, Potassium 4.1, Sodium 140, BNP 809.1  Vital Signs: Weight: 297 lbs (admission weight: 309 lbs) - dry weight ~290 lbs Blood pressure: 120/70s  Heart rate: 60s  I/O: net -0.7L yesterday; net -8.2L since admission  Medication Assistance / Insurance Benefits Check: Does the patient have prescription insurance?  Yes Type of insurance plan: [employer] Medicare  Outpatient Pharmacy:  Prior to admission outpatient pharmacy: [facility] mail order Is the patient willing to use [facility] TOC pharmacy at discharge? Yes Is the patient willing to transition their outpatient pharmacy to utilize a [facility] outpatient pharmacy?   No    Assessment: 1. Acute on chronic systolic and diastolic CHF (LVEF 40-45%). NYHA class II symptoms. - Off IV lasix . Recommend torsemide 20 mg BID at  discharge. Strict I/Os and daily weights. Keep K>4 and Mg>2.  - Continue metoprolol  XL 100 mg daily - No ACE/ARB/ARNI or MRA with renal dysfunction - Continue hydralazine  50 mg TID + Imdur  30 mg daily - Would avoid SGLT2i with chronic catheter  Plan: 1) Medication changes recommended at this time: - Restart torsemide 20 mg BID at discharge   2) Patient assistance: - None pending  3)  Education  - Patient has been educated on current HF medications and potential additions to HF medication regimen - Patient verbalizes understanding that over the next few months, these medication doses may change and more medications may be added to optimize HF regimen - Patient has been educated on basic disease state pathophysiology and goals of therapy   Duwaine Plant, PharmD, BCPS Heart Failure Stewardship Pharmacist Phone [phone]

## 2023-12-20 NOTE — Care Management Important Message (Signed)
 Important Message  Patient Details  Name: Thomas [name] MRN: [id number] Date of Birth: 02/09/1951   Important Message Given:  Yes - Medicare IM     [name] 12/20/2023, 12:41 PM

## 2023-12-20 NOTE — Telephone Encounter (Signed)
 Patient Product/process development scientist completed.    The patient is insured through [employer]. Patient has Medicare and is not eligible for a copay card, but may be able to apply for patient assistance or Medicare RX Payment Plan (Patient Must reach out to their plan, if eligible for payment plan), if available.    Ran test claim for Eliquis 5 mg and the current 30 day co-pay is $0.00.   This test claim was processed through [facility]- copay amounts may vary at other pharmacies due to pharmacy/plan contracts, or as the patient moves through the different stages of their insurance plan.     Reyes Sharps, CPHT Pharmacy Technician Patient Advocate Specialist Lead [facility] Patient Advocate Team Direct Number: [phone]  Fax: [phone]

## 2023-12-20 NOTE — Plan of Care (Signed)
   Problem: Health Behavior/Discharge Planning: Goal: Ability to manage health-related needs will improve Outcome: Progressing   Problem: Clinical Measurements: Goal: Ability to maintain clinical measurements within normal limits will improve Outcome: Progressing

## 2023-12-20 NOTE — Progress Notes (Signed)
 Mobility Specialist Progress Note:    12/20/23 0946  Mobility  Activity Dangled on edge of bed;Pivoted/transferred from bed to chair  Level of Assistance Contact guard assist, steadying assist  Assistive Device Wheelchair  Distance Ambulated (ft) 3 ft  Range of Motion/Exercises Active;Left leg;Right leg  Activity Response Tolerated fair  Mobility Referral Yes  Mobility visit 1 Mobility  Mobility Specialist Start Time (ACUTE ONLY) 0946  Mobility Specialist Stop Time (ACUTE ONLY) 1012  Mobility Specialist Time Calculation (min) (ACUTE ONLY) 26 min   Receive pt laying in bed agreeable to session. Pt c/o about breathing; one of the focuses was breathing techniques this session. Pt also performed 3 sts, leg ext, leg curl, heel/toe raises, leg lifts, and 10 mini squats. Returned pt to wheelchair w/ all needs met.   Venetia Keel Mobility Specialist Please Neurosurgeon or Rehab Office at [phone]

## 2023-12-20 NOTE — Progress Notes (Signed)
 Due to non-stable scr, we will change xarelto  to apixaban 5mg  BID after discussion with Dr. Arlice.    Apixaban 5mg  BID  Sergio Batch, PharmD, BCIDP, AAHIVP, CPP Infectious Disease Pharmacist 12/20/2023 12:56 PM

## 2023-12-20 NOTE — Progress Notes (Signed)
   Rounding Note    Patient Name: HRISHIKESH HOEG Date of Encounter: 12/20/2023  [facility] Cardiologist: Stanly DELENA Leavens, MD   Subjective   Still feels tired/short of breath with minimal exertion. Reviewed that his kidney function has worsened, diuretics on hold. Discussed plan, see below.  Inpatient Medications    Scheduled Meds:  amiodarone  400 mg Oral BID   Followed by   NOREEN ON 12/25/2023] amiodarone  200 mg Oral Daily   apixaban  5 mg Oral BID   atorvastatin   40 mg Oral Daily   brimonidine  1 drop Left Eye BID   hydrALAZINE   25 mg Oral Q8H   isosorbide  mononitrate  30 mg Oral Daily   metoprolol  succinate  100 mg Oral Daily   potassium chloride   40 mEq Oral BID   Continuous Infusions:  PRN Meds:    Vital Signs    Vitals:   12/20/23 0801 12/20/23 0938 12/20/23 1223 12/20/23 1359  BP: 128/70 128/70 97/66 (!) 95/58  Pulse: 60 60 (!) 59   Resp: 18  18   Temp: 97.6 F (36.4 C)  (!) 97.4 F (36.3 C)   TempSrc: Oral  Oral   SpO2: 95%  95%   Weight:  134.9 kg    Height:        Intake/Output Summary (Last 24 hours) at 12/20/2023 1521 Last data filed at 12/20/2023 1300 Gross per 24 hour  Intake 718 ml  Output 550 ml  Net 168 ml      12/20/2023    9:38 AM 12/19/2023    6:22 AM 12/18/2023    6:07 AM  Last 3 Weights  Weight (lbs) 297 lb 6.4 oz 294 lb 1.6 oz 302 lb 4 oz  Weight (kg) 134.9 kg 133.403 kg 137.1 kg      Telemetry    Sinus brady - Personally Reviewed  Physical Exam   GEN: No acute distress.  On O2 by nasal cannula Neck: JVD difficult to visualize Cardiac: RRR, no murmurs, rubs, or gallops.  Respiratory: Distant but sound clear GI: Soft, nontender, non-distended  MS: warm, bilateral firm edema; No deformity. Neuro:  Nonfocal  Psych: Normal affect   New pertinent results (labs, ECG, imaging, cardiac studies)     Assessment & Plan    Acute on chronic systolic and diastolic heart failure Chronic kidney disease  stage 3b with acute kidney injury this admission Pulmonary hypertension, group 2/3 -GDMT with hydralazine , imdur , metoprolol  succinate. May need to cut back/hold on these if blood pressure doesn't improve -was on losartan , spironolactone  at home, held for worsening renal function -no SGLT2i with chronic catheter -Cr up to 2.84 today, stable from 2.85 yesterday but above his average. Diuretic on hold -blood pressure borderline low, possible intravascularly depleted -admission weight 140.2 kg, today's weight 134.9 kg, charted net negative 8L  Paroxysmal atrial fibrillation -on amiodarone, apixaban in patient (previously on rivaroxaban ), changed due to renal function  Obesity OSA -continue mounjaro  as outpatient  Nonobstructive CAD:  -no aspirin  given DOAC. On atorvastatin   Difficult situation. Hard to assess volume status by exam, clinically still feels poorly, but labs suggest intravascular depletion. If renal function improving tomorrow, may restart oral diuretics, on torsemide at home.    Signed, Shelda Bruckner, MD  12/20/2023, 3:21 PM

## 2023-12-20 NOTE — Discharge Instructions (Signed)

## 2023-12-20 NOTE — TOC Progression Note (Signed)
 Transition of Care [facility]) - Progression Note    Patient Details  Name: Thomas [name] MRN: [id number] Date of Birth: 1950-09-25  [facility]) CM/SW Contact  Waddell Barnie Rama, RN Phone Number: 12/20/2023, 11:34 AM  Clinical Narrative:    Per Darlyn with [employer], states the bariatric walker will be delivered to the house with the hospital bed.    Expected Discharge Plan: Home w Home Health Services Barriers to Discharge: Continued Medical Work up               Expected Discharge Plan and Services In-house Referral: NA Discharge Planning Services: CM Consult Post Acute Care Choice: NA Living arrangements for the past 2 months: Single Family Home                 DME Arranged: Hospital bed DME Agency: [employer] Date DME Agency Contacted: 12/16/23 Time DME Agency Contacted: [phone] Representative spoke with at DME Agency: Zack HH Arranged: NA           Social Drivers of Health (SDOH) Interventions SDOH Screenings   Food Insecurity: No Food Insecurity (12/14/2023)  Housing: Unknown (12/16/2023)  Transportation Needs: No Transportation Needs (12/16/2023)  Utilities: Not At Risk (12/14/2023)  Alcohol  Screen: Low Risk  (12/16/2023)  Depression (PHQ2-9): Low Risk  (11/16/2023)  Financial Resource Strain: Low Risk  (12/16/2023)  Physical Activity: Inactive (12/09/2017)  Social Connections: Unknown (12/14/2023)  Recent Concern: Social Connections - Moderately Isolated (12/14/2023)  Stress: No Stress Concern Present (12/09/2017)  Tobacco Use: Low Risk  (12/13/2023)    Readmission Risk Interventions    12/16/2023    2:44 PM  Readmission Risk Prevention Plan  Transportation Screening Complete  HRI or Home Care Consult Complete  Palliative Care Screening Not Applicable  Medication Review (RN Care Manager) Complete

## 2023-12-20 NOTE — Progress Notes (Signed)
 PROGRESS NOTE  Thomas [name]  DOB: 1950-03-04  PCP: Thomas Harlene POUR, NP FMW:[id number]  DOA: 12/13/2023  LOS: 7 days  [name] Day: 8  Subjective: Patient was seen and examined this morning. Sitting up at the edge of the bed.  Not in distress. Family at bedside. Heart rate in 50s and 60s in the last 24 hours with blood pressure in normal range throughout, [name] 3 L oxygen  Labs from this morning with creatinine stable at 2.84  Brief narrative: Thomas [name] is a 73 y.o. male with PMH significant for chronic respiratory failure [name] 3-4L home O2, OSA, OHS, pulmonary hypertension, DVT/PE [name] Xarelto , HFmrEF (EF 40-45% in 06/2023), paroxysmal A-fib, type 2 diabetes mellitus, prostate cancer, urinary retention, chronic suprapubic catheter Last admitted to [name] in 07/2023 for Klebsiella UTI and ADHF (diuresed 19 lbs)   10/27, patient presented to the ED with complaint of shortness of breath, dyspnea [name] exertion, progressive bilateral lower EXTR edema, two-pillow orthopnea, ongoing for about 2 weeks.  He has been sleeping [name] a recliner.  Reports inability to use CPAP at home.  In the ED, patient was tachycardic and tachypneic , [name] 4 his oxygen  which is his home requirement Labs with creatinine 2.21, baseline 1.9 from 10/9, BNP 829 Chest x-ray with basilar atelectasis Given IV Lasix  80 mg Admitted to [name]  Assessment and plan: Acute exacerbation of chronic systolic CHF Pulmonary hypertension HTN Presented with volume overload symptoms, elevated BNP It seems patient had RHC/LHC done in 2019.  Already had moderate to severe pulmonary hypertension at that time. Echo from May 2025 and during this hospitalization 10/29 showed EF 40 to 45%, elevated PASP at 57 mmHg. PTA meds- metoprolol , hydralazine , Imdur , losartan , torsemide, Aldactone  Cardiology consult was obtained Aggressive diuresis with IV Lasix  and metolazone.  More than 8.1 L of diuresis since admission.  With rising  creatinine, Lasix  was held [name] 11/1. Creatinine seems to have plateaued today. Defer to cardiology for further diuresis need. Currently [name] Toprol  100 mg daily, Imdur  30 mg daily, hydralazine  50 mg 3 times daily. [name] hold are losartan  and Aldactone .  Continue to monitor for daily intake output, weight, blood pressure, BNP, renal function and electrolytes. Net IO Since Admission: -8,045.41 mL [12/20/23 1305] Recent Labs  Lab 12/16/23 0244 12/17/23 0250 12/18/23 0246 12/19/23 0739 12/20/23 0248  BUN 34* 39* 43* 49* 51*  CREATININE 1.96* 2.11* 2.40* 2.85* 2.84*  NA 143 143 142 141 140  K 3.6 4.0 3.5 4.2 4.1   Hypokalemia Potassium level improved with replacement  A-fib with RVR  Paroxysmal A-fib Patient has been in and out of A-fib with RVR.   Heart rate in 60s this morning. Currently [name] Toprol  100 mg daily and amiodarone 400 mg twice daily for 7 days to be followed by 200 mg daily Chronically anticoagulated with Xarelto  20 g daily.  Discussed with pharmacy.  Given rising creatinine, decision was made to switch him to Eliquis twice daily  HLD PTA ASA 81mg  daily, atorvastatin  40mg  daily Left heart cath from 2019 with nonobstructive disease. Per cardiology, no need of aspirin  while [name] anticoagulation.  Continue statin  Chronic respiratory failure  OSA/OHS [name] 3-4L home O2 Remains [name] baseline requirement currently at this time  DVT/PE Continue chronic anticoagulation  H/o prostate cancer Chronic urinary retention s/p chronic suprapubic catheter Patient follows up with urology at [name] and gets suprapubic catheter changed monthly.   10/28, changed by RN in the [name].  Morbid Obesity  Body mass index is  43.92 kg/m. Patient has been advised to make an attempt to improve diet and exercise patterns to aid in weight loss.   Mobility: Wheelchair-bound mostly but able to walk inside the room.  PT eval requested PT Orders:   PT Follow up Rec: Home Health Pt10/31/2025  1045   Goals of care   Code Status: Full Code     DVT prophylaxis:   apixaban (ELIQUIS) tablet 5 mg   Antimicrobials: None Fluid: None Consultants: None Family Communication: Family at bedside  Status: Inpatient Level of care:  Telemetry Cardiac   Patient is from: Home Needs to continue in-[name] care: Ongoing diuresis and monitoring of renal function Anticipated d/c to: Pending clinical course.  If improves, hopefully home in 1 to 2 days   Diet:  Diet Order             Diet 2 gram sodium Room service appropriate? Yes; Fluid consistency: Thin  Diet effective now                   Scheduled Meds:  amiodarone  400 mg Oral BID   Followed by   Thomas [name] 12/25/2023] amiodarone  200 mg Oral Daily   apixaban  5 mg Oral BID   atorvastatin   40 mg Oral Daily   brimonidine  1 drop Left Eye BID   hydrALAZINE   50 mg Oral Q8H   isosorbide  mononitrate  30 mg Oral Daily   metoprolol  succinate  100 mg Oral Daily   potassium chloride   40 mEq Oral BID    PRN meds:    Infusions:     Antimicrobials: Anti-infectives (From admission, onward)    None       Objective: Vitals:   12/20/23 0938 12/20/23 1223  BP: 128/70 97/66  Pulse: 60 (!) 59  Resp:  18  Temp:  (!) 97.4 F (36.3 C)  SpO2:  95%    Intake/Output Summary (Last 24 hours) at 12/20/2023 1305 Last data filed at 12/20/2023 1300 Gross per 24 hour  Intake 718 ml  Output 550 ml  Net 168 ml   Filed Weights   12/18/23 0607 12/19/23 0622 12/20/23 0938  Weight: (!) 137.1 kg 133.4 kg 134.9 kg   Weight change:  Body mass index is 43.92 kg/m.   Physical Exam: General exam: Pleasant, elderly African-American male, morbidly obese. Skin: No rashes, lesions or ulcers. HEENT: Atraumatic, normocephalic, no obvious bleeding Lungs: Clear to auscultation bilaterally CVS: S1, S2, no murmur,   GI/Abd: Soft, nontender, nondistended, bowel sound present,   CNS: Alert, awake, oriented x 3 Psychiatry: Mood  appropriate Extremities: Improving bilateral pedal edema, no calf tenderness,   Data Review: I have personally reviewed the laboratory data and studies available.  F/u labs ordered Unresulted Labs (From admission, onward)    None       Signed, Chapman Rota, MD [facility] 12/20/2023

## 2023-12-21 DIAGNOSIS — I50813 Acute on chronic right heart failure: Secondary | ICD-10-CM | POA: Diagnosis not present

## 2023-12-21 LAB — BASIC METABOLIC PANEL WITH GFR
Anion gap: 14 (ref 5–15)
BUN: 52 mg/dL — ABNORMAL HIGH (ref 8–23)
CO2: 24 mmol/L (ref 22–32)
Calcium: 8.9 mg/dL (ref 8.9–10.3)
Chloride: 104 mmol/L (ref 98–111)
Creatinine, Ser: 2.76 mg/dL — ABNORMAL HIGH (ref 0.61–1.24)
GFR, Estimated: 24 mL/min — ABNORMAL LOW
Glucose, Bld: 125 mg/dL — ABNORMAL HIGH (ref 70–99)
Potassium: 4.3 mmol/L (ref 3.5–5.1)
Sodium: 142 mmol/L (ref 135–145)

## 2023-12-21 MED ORDER — TORSEMIDE 20 MG PO TABS
20.0000 mg | ORAL_TABLET | Freq: Two times a day (BID) | ORAL | Status: DC
  Administered 2023-12-21 – 2023-12-22 (×2): 20 mg via ORAL
  Filled 2023-12-21 (×2): qty 1

## 2023-12-21 NOTE — Progress Notes (Signed)
   Heart Failure Stewardship Pharmacist Progress Note   PCP: [name], NP PCP-Cardiologist: Stanly DELENA Leavens, MD    HPI:  73 yo M with PMH of chronic respiratory failure, DVT/PE, CHF, afib, diabetes, T2DM, HTN, OSA, pulmonary HTN, prostate cancer, CAD, and urinary retention with chronic suprapubic catheter.   Presented to the ED on 10/27 with shortness of breath, LE edema, orthopnea, and DOE. BNP elevated. CXR with no acute cardiopulmonary process. ECHO 10/29 with LVEF 40-45% (stable from May), RV not well visualized.   States his breathing is still improved since admission, remains stable from yesterday. LE edema improved. Trace edema on exam. On 3L O2 but states this is what he is requiring at home. Creatinine is slightly improved from yesterday but remains elevated from baseline. He is asking when he can go home.   Current HF Medications: Beta Blocker: metoprolol  succinate 100 mg daily Other: hydralazine  25 mg TID + Imdur  30 mg daily  Prior to admission HF Medications: Diuretic: torsemide 20 mg BID Beta blocker: metoprolol  tartrate 100 mg BID MRA: spironolactone  25 mg daily Other: hydralazine  75 mg TID + Imdur  30 mg daily  Pertinent Lab Values: Serum creatinine 2.84>2.76, BUN 52, Potassium 4.3, Sodium 142, BNP 809.1  Vital Signs: Weight: 297 lbs (admission weight: 309 lbs) - dry weight ~290 lbs Blood pressure: 110-130/70s  Heart rate: 50s  I/O: incomplete yesterday; net -8.5L since admission  Medication Assistance / Insurance Benefits Check: Does the patient have prescription insurance?  Yes Type of insurance plan: [employer]  Outpatient Pharmacy:  Prior to admission outpatient pharmacy: [facility] mail order Is the patient willing to use [facility] TOC pharmacy at discharge? Yes Is the patient willing to transition their outpatient pharmacy to utilize a [facility] outpatient pharmacy?   No    Assessment: 1. Acute on chronic systolic and diastolic CHF  (LVEF 40-45%). NYHA class II symptoms. - Off IV lasix . Recommend torsemide 20 mg BID at discharge. Strict I/Os and daily weights. No new weight today. Keep K>4 and Mg>2.  - Continue metoprolol  XL 100 mg daily - No ACE/ARB/ARNI or MRA with renal dysfunction - Continue hydralazine  25 mg TID + Imdur  30 mg daily - Would avoid SGLT2i with chronic catheter  Plan: 1) Medication changes recommended at this time: - Restart torsemide 20 mg BID  2) Patient assistance: - None pending  3)  Education  - Patient has been educated on current HF medications and potential additions to HF medication regimen - Patient verbalizes understanding that over the next few months, these medication doses may change and more medications may be added to optimize HF regimen - Patient has been educated on basic disease state pathophysiology and goals of therapy   Duwaine Plant, PharmD, BCPS Heart Failure Stewardship Pharmacist Phone [phone]

## 2023-12-21 NOTE — Progress Notes (Signed)
 Mobility Specialist Progress Note:    12/21/23 1209  Mobility  Activity Ambulated with assistance ([place] to bed)  Level of Assistance Contact guard assist, steadying assist  Assistive Device Front wheel walker  Distance Ambulated (ft) 15 ft  Activity Response Tolerated well  Mobility Referral Yes  Mobility visit 1 Mobility  Mobility Specialist Start Time (ACUTE ONLY) 1209  Mobility Specialist Stop Time (ACUTE ONLY) 1215  Mobility Specialist Time Calculation (min) (ACUTE ONLY) 6 min   Pt needing assistance returning from bathroom to bed. No c/o any symptoms. Pt able to stand, move, and ambulate w/o much assist. Returned pt to EOB w/ all needs met.   Venetia Keel Mobility Specialist Please Neurosurgeon or Rehab Office at [phone]

## 2023-12-21 NOTE — Progress Notes (Signed)
 PROGRESS NOTE  Thomas [name]  DOB: 1951-01-02  PCP: [name], NP FMW:[id number]  DOA: 12/13/2023  LOS: 8 days  Hospital Day: 9  Subjective: Patient was seen and examined this morning.  Lying down in bed.  Not in distress. Family at bedside and on the phone. We had a long conversation regarding his heart failure, diuretics and kidney function Labs from this morning showed improvement in creatinine to 2.76 Seen him by cardiologist later.  Torsemide resumed Afebrile, bradycardic in 50s mostly, blood pressure normal range, breathing on 3 L oxygen   Brief narrative: NORMAL RECINOS is a 73 y.o. male with PMH significant for chronic respiratory failure on 3-4L home O2, OSA, OHS, pulmonary hypertension, DVT/PE on Xarelto , HFmrEF (EF 40-45% in 06/2023), paroxysmal A-fib, type 2 diabetes mellitus, prostate cancer, urinary retention, chronic suprapubic catheter Last admitted to [facility] in 07/2023 for Klebsiella UTI and ADHF (diuresed 19 lbs)   10/27, patient presented to the ED with complaint of shortness of breath, dyspnea on exertion, progressive bilateral lower EXTR edema, two-pillow orthopnea, ongoing for about 2 weeks.  He has been sleeping on a recliner.  Reports inability to use CPAP at home.  In the ED, patient was tachycardic and tachypneic , on 4 his oxygen  which is his home requirement Labs with creatinine 2.21, baseline 1.9 from 10/9, BNP 829 Chest x-ray with basilar atelectasis Given IV Lasix  80 mg Admitted to [facility]  Assessment and plan: Acute exacerbation of chronic systolic CHF Pulmonary hypertension HTN Presented with volume overload symptoms, elevated BNP It seems patient had RHC/LHC done in 2019.  Already had moderate to severe pulmonary hypertension at that time. Echo from May 2025 and during this hospitalization 10/29 showed EF 40 to 45%, elevated PASP at 57 mmHg. PTA meds- metoprolol , hydralazine , Imdur , losartan , torsemide, Aldactone  Cardiology consult  was obtained Aggressive diuresis with IV Lasix  and metolazone.  More than 8.1 L of diuresis since admission.  With rising creatinine, Lasix  was held on 11/1. Creatinine improved to 2.76 today.  Blood pressure in normal range. Cardiologist resumed torsemide 20 mg twice daily today. Currently patient is also on Toprol  100 mg daily, Imdur  30 mg daily, hydralazine  50 mg 3 times daily. On hold are losartan  and Aldactone .  Continue to monitor for daily intake output, weight, blood pressure, BNP, renal function and electrolytes. Net IO Since Admission: -8,914.41 mL [12/21/23 1318] Recent Labs  Lab 12/17/23 0250 12/18/23 0246 12/19/23 0739 12/20/23 0248 12/21/23 0922  BUN 39* 43* 49* 51* 52*  CREATININE 2.11* 2.40* 2.85* 2.84* 2.76*  NA 143 142 141 140 142  K 4.0 3.5 4.2 4.1 4.3   Hypokalemia Potassium level improved with replacement  A-fib with RVR  Paroxysmal A-fib Patient has been in and out of A-fib with RVR.   Currently on Toprol  100 mg daily and amiodarone 400 mg twice daily for 7 days to be followed by 200 mg daily. Heart rate in 50s in the last 24 hours Chronically anticoagulated with Xarelto  20 g daily.  Discussed with pharmacy.  Given unstable creatinine, decision was made to switch him to Eliquis twice daily  HLD PTA ASA 81mg  daily, atorvastatin  40mg  daily Left heart cath from 2019 with nonobstructive disease. Per cardiology, no need of aspirin  while on anticoagulation.  Continue statin  Chronic respiratory failure  OSA/OHS on 3-4L home O2 Remains on baseline requirement currently at this time  DVT/PE Continue chronic anticoagulation  H/o prostate cancer Chronic urinary retention s/p chronic suprapubic catheter Patient follows up  with urology at [facility] and gets suprapubic catheter changed monthly.   10/28, changed by RN in the hospital.  Morbid Obesity  Body mass index is 43.92 kg/m. Patient has been advised to make an attempt to improve diet and exercise patterns  to aid in weight loss.   Mobility: Wheelchair-bound mostly but able to walk inside the room.  PT eval obtained. PT Orders:   PT Follow up Rec: Home Health Pt10/31/2025 1045   Goals of care   Code Status: Full Code     DVT prophylaxis:   apixaban (ELIQUIS) tablet 5 mg   Antimicrobials: None Fluid: None Consultants: None Family Communication: Family at bedside  Status: Inpatient Level of care:  Telemetry Cardiac   Patient is from: Home Needs to continue in-hospital care: Ongoing diuresis and monitoring of renal function Anticipated d/c to: Pending clinical course.  If improves, hopefully home tomorrow   Diet:  Diet Order             Diet 2 gram sodium Room service appropriate? Yes; Fluid consistency: Thin  Diet effective now                   Scheduled Meds:  amiodarone  400 mg Oral BID   Followed by   NOREEN ON 12/25/2023] amiodarone  200 mg Oral Daily   apixaban  5 mg Oral BID   atorvastatin   40 mg Oral Daily   brimonidine  1 drop Left Eye BID   hydrALAZINE   25 mg Oral Q8H   isosorbide  mononitrate  30 mg Oral Daily   metoprolol  succinate  100 mg Oral Daily   potassium chloride   40 mEq Oral BID   torsemide  20 mg Oral BID    PRN meds:    Infusions:     Antimicrobials: Anti-infectives (From admission, onward)    None       Objective: Vitals:   12/21/23 1258 12/21/23 1316  BP: 96/64   Pulse: (!) 57   Resp: 20   Temp: 97.8 F (36.6 C)   SpO2: 92% 93%    Intake/Output Summary (Last 24 hours) at 12/21/2023 1318 Last data filed at 12/21/2023 1300 Gross per 24 hour  Intake 356 ml  Output 1225 ml  Net -869 ml   Filed Weights   12/18/23 0607 12/19/23 0622 12/20/23 0938  Weight: (!) 137.1 kg 133.4 kg 134.9 kg   Weight change:  Body mass index is 43.92 kg/m.   Physical Exam: General exam: Pleasant, elderly African-American male, morbidly obese.  Not in distress Skin: No rashes, lesions or ulcers. HEENT: Atraumatic, normocephalic, no  obvious bleeding Lungs: Clear to auscultation bilaterally CVS: S1, S2, no murmur,   GI/Abd: Soft, nontender, nondistended, bowel sound present,   CNS: Alert, awake, oriented x 3 Psychiatry: Mood appropriate Extremities: Improving bilateral pedal edema, no calf tenderness,   Data Review: I have personally reviewed the laboratory data and studies available.  F/u labs ordered Unresulted Labs (From admission, onward)     Start     Ordered   12/22/23 0500  Basic metabolic panel with GFR  Daily,   R     Question:  Specimen collection method  Answer:  Lab=Lab collect   12/21/23 0910            Signed, Chapman Rota, MD [facility] 12/21/2023

## 2023-12-21 NOTE — Plan of Care (Signed)
  Problem: Clinical Measurements: Goal: Ability to maintain clinical measurements within normal limits will improve Outcome: Progressing   Problem: Education: Goal: Knowledge of General Education information will improve Description: Including pain rating scale, medication(s)/side effects and non-pharmacologic comfort measures Outcome: Progressing   

## 2023-12-21 NOTE — Progress Notes (Signed)
 Rounding Note    Patient Name: Thomas [name] Date of Encounter: 12/21/2023  [facility] Cardiologist: Stanly DELENA Leavens, MD   Subjective   Breathing may be slightly better today, but still winded with minimal activity. Family in room, discussed plan, see below.  Inpatient Medications    Scheduled Meds:  amiodarone  400 mg Oral BID   Followed by   NOREEN ON 12/25/2023] amiodarone  200 mg Oral Daily   apixaban  5 mg Oral BID   atorvastatin   40 mg Oral Daily   brimonidine  1 drop Left Eye BID   hydrALAZINE   25 mg Oral Q8H   isosorbide  mononitrate  30 mg Oral Daily   metoprolol  succinate  100 mg Oral Daily   potassium chloride   40 mEq Oral BID   torsemide  20 mg Oral BID   Continuous Infusions:  PRN Meds:    Vital Signs    Vitals:   12/20/23 1948 12/21/23 0020 12/21/23 0451 12/21/23 0735  BP: (!) 149/83 114/72 (!) 115/50 130/73  Pulse: (!) 56 (!) 58 (!) 56 (!) 56  Resp: 18 18 18 17   Temp: 97.9 F (36.6 C) 97.9 F (36.6 C) 97.9 F (36.6 C) 97.7 F (36.5 C)  TempSrc: Oral Oral Oral Oral  SpO2: 100% 96% 95% 96%  Weight:      Height:        Intake/Output Summary (Last 24 hours) at 12/21/2023 1108 Last data filed at 12/21/2023 0855 Gross per 24 hour  Intake 474 ml  Output 850 ml  Net -376 ml      12/20/2023    9:38 AM 12/19/2023    6:22 AM 12/18/2023    6:07 AM  Last 3 Weights  Weight (lbs) 297 lb 6.4 oz 294 lb 1.6 oz 302 lb 4 oz  Weight (kg) 134.9 kg 133.403 kg 137.1 kg      Telemetry    Sinus brady - Personally Reviewed  Physical Exam   GEN: No acute distress.  On O2 by nasal cannula Neck: JVD difficult to visualize Cardiac: RRR, no murmurs, rubs, or gallops.  Respiratory: Distant but sound clear except for faint rales at base GI: Soft, nontender, non-distended  MS: warm, bilateral mild nonpitting edema Neuro:  Nonfocal  Psych: Normal affect   New pertinent results (labs, ECG, imaging, cardiac studies)     Assessment &  Plan    Acute on chronic systolic and diastolic heart failure Chronic kidney disease stage 3b with acute kidney injury this admission Pulmonary hypertension, group 2/3 -GDMT with hydralazine , imdur , metoprolol  succinate. Blood pressure improved today, will continue these -was on losartan , spironolactone  at home, held for worsening renal function -no SGLT2i with chronic catheter -admission weight 140.2 kg, yesterday's weight 134.9 kg, charted net negative 8.5L -Cr down slightly from 2.84 to 2.76. Given improvement, will restart his home oral torsemide at 20 mg BID. Monitor to make sure he has good urine output on this, if not will need to increase dose  Paroxysmal atrial fibrillation -on amiodarone, apixaban in patient (previously on rivaroxaban ), changed due to renal function  Obesity OSA -continue mounjaro  as outpatient  Nonobstructive CAD:  -no aspirin  given DOAC. On atorvastatin   If he has good urine response to oral torsemide, he may be able to be discharged in the near future. He has follow up scheduled with [facility] on 12/27/23 and we can recheck BMET at that time    Signed, Shelda Bruckner, MD  12/21/2023, 11:08 AM

## 2023-12-21 NOTE — Progress Notes (Addendum)
 Physical Therapy Treatment Patient Details Name: Thomas [name] MRN: [id number] DOB: 30-Mar-1950 Today's Date: 12/21/2023   History of Present Illness 73 yo M adm 12/13/23 with orthopnea, acute on chronic CHF . PMhx: HTN, obesity, HLD, T2DM, BPH, OSA, Rt TKA, CAD, AFib with RVR, HFrEF, chronic respiratory failure on 3-4L O2    PT Comments  Pt pleasant and reports improved breathing from admission. Pt on 3L on arrival with SPO2 91%, initial 2 bouts of gait on 4L with SPO2>98% then 3rd trial on 3L with SPO2 94%. Pt educated for RW use, posture, gait and progression with continued ambulation and activity encouraged. Pt in bathroom end of session with staff and family aware. Will continue to follow.    HR 63   If plan is discharge home, recommend the following: A little help with walking and/or transfers;A little help with bathing/dressing/bathroom;Assistance with cooking/housework;Help with stairs or ramp for entrance   Can travel by private vehicle        Equipment Recommendations  Rolling walker (2 wheels)    Recommendations for Other Services       Precautions / Restrictions Precautions Precautions: Fall;Other (comment) Recall of Precautions/Restrictions: Intact Precaution/Restrictions Comments: watch HR and SPO2     Mobility  Bed Mobility Overal bed mobility: Modified Independent             General bed mobility comments: increased time with reliance on rail and HOB 55 degrees- sleeps in recliner and denied need to get OOB with HOB flat    Transfers Overall transfer level: Needs assistance   Transfers: Sit to/from Stand Sit to Stand: Contact guard assist           General transfer comment: cues for hand placement, sequence and safety    Ambulation/Gait Ambulation/Gait assistance: Contact guard assist Gait Distance (Feet): 35 Feet Assistive device: Rolling walker (2 wheels) Gait Pattern/deviations: Step-through pattern, Decreased stride length, Trunk  flexed   Gait velocity interpretation: <1.8 ft/sec, indicate of risk for recurrent falls   General Gait Details: cues for posture and breathing technique, assist to manage lines. SPO2 93% on 3L with gait. pt walked 20', 20', 35' respectively over 3 trials with seated rest between each   Stairs             Wheelchair Mobility     Tilt Bed    Modified Rankin (Stroke Patients Only)       Balance Overall balance assessment: Needs assistance Sitting-balance support: No upper extremity supported, Feet supported Sitting balance-Leahy Scale: Fair     Standing balance support: Bilateral upper extremity supported, During functional activity, Reliant on assistive device for balance Standing balance-Leahy Scale: Poor Standing balance comment: UB support on RW in standing                            Communication Communication Communication: No apparent difficulties  Cognition Arousal: Alert Behavior During Therapy: WFL for tasks assessed/performed   PT - Cognitive impairments: No apparent impairments                         Following commands: Intact      Cueing Cueing Techniques: Verbal cues  Exercises      General Comments        Pertinent Vitals/Pain Pain Assessment Pain Assessment: No/denies pain    Home Living  Prior Function            PT Goals (current goals can now be found in the care plan section) Progress towards PT goals: Progressing toward goals    Frequency    Min 2X/week      PT Plan      Co-evaluation              AM-PAC PT 6 Clicks Mobility   Outcome Measure  Help needed turning from your back to your side while in a flat bed without using bedrails?: A Little Help needed moving from lying on your back to sitting on the side of a flat bed without using bedrails?: A Little Help needed moving to and from a bed to a chair (including a wheelchair)?: A Little Help  needed standing up from a chair using your arms (e.g., wheelchair or bedside chair)?: A Little Help needed to walk in hospital room?: A Little Help needed climbing 3-5 steps with a railing? : A Lot 6 Click Score: 17    End of Session Equipment Utilized During Treatment: Oxygen  Activity Tolerance: Patient tolerated treatment well Patient left: Other (comment);with call bell/phone within reach (in bathroom with pull cord, RN awaren and family sitting outside of room) Nurse Communication: Mobility status PT Visit Diagnosis: Other abnormalities of gait and mobility (R26.89);Difficulty in walking, not elsewhere classified (R26.2);Muscle weakness (generalized) (M62.81)     Time: 8876-8849 PT Time Calculation (min) (ACUTE ONLY): 27 min  Charges:    $Gait Training: 8-22 mins $Therapeutic Activity: 8-22 mins PT General Charges $$ ACUTE PT VISIT: 1 Visit                     Lenoard SQUIBB, PT Acute Rehabilitation Services Office: [phone]    Shaleka Brines B Arrie Borrelli 12/21/2023, 1:19 PM

## 2023-12-22 ENCOUNTER — Telehealth: Payer: Self-pay

## 2023-12-22 ENCOUNTER — Other Ambulatory Visit (HOSPITAL_COMMUNITY): Payer: Self-pay

## 2023-12-22 DIAGNOSIS — I50813 Acute on chronic right heart failure: Secondary | ICD-10-CM | POA: Diagnosis not present

## 2023-12-22 LAB — BASIC METABOLIC PANEL WITH GFR
Anion gap: 13 (ref 5–15)
BUN: 50 mg/dL — ABNORMAL HIGH (ref 8–23)
CO2: 27 mmol/L (ref 22–32)
Calcium: 8.9 mg/dL (ref 8.9–10.3)
Chloride: 101 mmol/L (ref 98–111)
Creatinine, Ser: 2.86 mg/dL — ABNORMAL HIGH (ref 0.61–1.24)
GFR, Estimated: 23 mL/min — ABNORMAL LOW (ref >60)
Glucose, Bld: 141 mg/dL — ABNORMAL HIGH (ref 70–99)
Potassium: 3.9 mmol/L (ref 3.5–5.1)
Sodium: 141 mmol/L (ref 135–145)

## 2023-12-22 MED ORDER — METOPROLOL SUCCINATE ER 100 MG PO TB24
100.0000 mg | ORAL_TABLET | Freq: Every day | ORAL | 0 refills | Status: DC
  Filled 2023-12-22: qty 30, 30d supply, fill #0

## 2023-12-22 MED ORDER — APIXABAN 5 MG PO TABS
5.0000 mg | ORAL_TABLET | Freq: Two times a day (BID) | ORAL | 0 refills | Status: DC
  Filled 2023-12-22: qty 60, 30d supply, fill #0

## 2023-12-22 MED ORDER — AMIODARONE HCL 200 MG PO TABS
ORAL_TABLET | ORAL | 0 refills | Status: DC
  Filled 2023-12-22: qty 42, 33d supply, fill #0

## 2023-12-22 NOTE — Progress Notes (Signed)
   Heart Failure Stewardship Pharmacist Progress Note   PCP: [name], NP PCP-Cardiologist: Stanly DELENA Leavens, MD    HPI:  73 yo M with PMH of chronic respiratory failure, DVT/PE, CHF, afib, diabetes, T2DM, HTN, OSA, pulmonary HTN, prostate cancer, CAD, and urinary retention with chronic suprapubic catheter.   Presented to the ED on 10/27 with shortness of breath, LE edema, orthopnea, and DOE. BNP elevated. CXR with no acute cardiopulmonary process. ECHO 10/29 with LVEF 40-45% (stable from May), RV not well visualized.   Feels good today. States that he had good response to torsemide yesterday. No edema on exam. On 3L O2 but states this is what he is requiring at home. Creatinine remains elevated from baseline. He is hopeful for discharge today.  Current HF Medications: Diuretic: torsemide 20 mg BID Beta Blocker: metoprolol  succinate 100 mg daily Other: Imdur  30 mg daily  Prior to admission HF Medications: Diuretic: torsemide 20 mg BID Beta blocker: metoprolol  tartrate 100 mg BID MRA: spironolactone  25 mg daily Other: hydralazine  75 mg TID + Imdur  30 mg daily  Pertinent Lab Values: Serum creatinine 2.84>2.76>2.86, BUN 50, Potassium 3.9, Sodium 141, BNP 809.1  Vital Signs: Weight: 297 lbs (admission weight: 309 lbs) - dry weight ~290 lbs Blood pressure: 130/70s  Heart rate: 50-60s  I/O: net -2L yesterday; net -10.2L since admission  Medication Assistance / Insurance Benefits Check: Does the patient have prescription insurance?  Yes Type of insurance plan: [employer]  Outpatient Pharmacy:  Prior to admission outpatient pharmacy: [facility] mail order Is the patient willing to use [facility] TOC pharmacy at discharge? Yes Is the patient willing to transition their outpatient pharmacy to utilize a [facility] outpatient pharmacy?   No    Assessment: 1. Acute on chronic systolic and diastolic CHF (LVEF 40-45%). NYHA class II symptoms. - Continue torsemide 20 mg  BID at discharge. Strict I/Os and daily weights. No new weight today. Keep K>4 and Mg>2.  - Continue metoprolol  XL 100 mg daily - No ACE/ARB/ARNI or MRA with renal dysfunction - Continue Imdur  30 mg daily - Stopped hydralazine  with episodes of hypotension - Would avoid SGLT2i with chronic catheter  Plan: 1) Medication changes recommended at this time: - None  2) Patient assistance: - None pending  3)  Education  - Patient has been educated on current HF medications and potential additions to HF medication regimen - Patient verbalizes understanding that over the next few months, these medication doses may change and more medications may be added to optimize HF regimen - Patient has been educated on basic disease state pathophysiology and goals of therapy   Duwaine Plant, PharmD, BCPS Heart Failure Stewardship Pharmacist Phone [phone]

## 2023-12-22 NOTE — TOC Transition Note (Addendum)
 Transition of Care [facility]) - Discharge Note   Patient Details  Name: Thomas [name] MRN: [id number] Date of Birth: February 05, 1951  Transition of [facility]) CM/SW Contact:  Waddell Barnie Rama, RN Phone Number: 12/22/2023, 10:36 AM   Clinical Narrative:    For dc today, NCM notified Lynette with [facility], he is also going home with [employer] so will also need HHRN along with the HHPT.  He has transportation,  [employer] will deliver the hospital bed and walker together today at his home.  Patient has his w/chair at the bedside.  Wife states his son will be transporting him home.     Barriers to Discharge: Continued Medical Work up   Patient Goals and CMS Choice Patient states their goals for this hospitalization and ongoing recovery are:: return home with wife   Choice offered to / list presented to : NA      Discharge Placement                       Discharge Plan and Services Additional resources added to the After Visit Summary for   In-house Referral: NA Discharge Planning Services: CM Consult Post Acute Care Choice: NA          DME Arranged: Hospital bed DME Agency: [employer] Date DME Agency Contacted: 12/16/23 Time DME Agency Contacted: [phone] Representative spoke with at DME Agency: Zack HH Arranged: NA          Social Drivers of Health (SDOH) Interventions SDOH Screenings   Food Insecurity: No Food Insecurity (12/14/2023)  Housing: Unknown (12/16/2023)  Transportation Needs: No Transportation Needs (12/16/2023)  Utilities: Not At Risk (12/14/2023)  Alcohol  Screen: Low Risk  (12/16/2023)  Depression (PHQ2-9): Low Risk  (11/16/2023)  Financial Resource Strain: Low Risk  (12/16/2023)  Physical Activity: Inactive (12/09/2017)  Social Connections: Unknown (12/14/2023)  Recent Concern: Social Connections - Moderately Isolated (12/14/2023)  Stress: No Stress Concern Present (12/09/2017)  Tobacco Use: Low Risk  (12/13/2023)     Readmission Risk  Interventions    12/16/2023    2:44 PM  Readmission Risk Prevention Plan  Transportation Screening Complete  HRI or Home Care Consult Complete  Palliative Care Screening Not Applicable  Medication Review (RN Care Manager) Complete

## 2023-12-22 NOTE — Discharge Summary (Signed)
 Physician Discharge Summary  [name] FMW:[id number] DOB: 06-15-1950 DOA: 12/13/2023  PCP: [name], NP  Admit date: 12/13/2023 Discharge date: 12/22/2023  Admitted from: Home Discharge disposition: Home with home health PT  Recommendations at discharge:  Heart failure medicines at discharge: Toprol  100 mg daily, Imdur  30 mg daily, torsemide 20 mg twice daily with KCl 20 mEq twice daily. Hydralazine , losartan  and Aldactone  as well which have been stopped Amiodarone 400 mg twice daily until 11/7, then changed to 200 mg daily starting 11/8 Xarelto  has been switched to Eliquis.  Stop aspirin  Metformin  has been stopped. Follow-up with cardiology as an outpatient.  You have an appointment on 12/27/2023.  Subjective: Patient was seen and examined this morning. Pleasant elderly African-American male.  Sitting up at the edge of the bed. Seen by cardiology earlier. Feels ready to go home. Patient is grateful to the care and education he has received. Afebrile, heart rate in 50s, blood pressure in 130s, on 3 L oxygen  Labs this morning with creatinine 2.86  Brief narrative: Thomas [name] is a 73 y.o. male with PMH significant for chronic respiratory failure on 3-4L home O2, OSA, OHS, pulmonary hypertension, DVT/PE on Xarelto , HFmrEF (EF 40-45% in 06/2023), paroxysmal A-fib, type 2 diabetes mellitus, prostate cancer, urinary retention, chronic suprapubic catheter Last admitted to [facility] in 07/2023 for Klebsiella UTI and ADHF (diuresed 19 lbs)   10/27, patient presented to the ED with complaint of shortness of breath, dyspnea on exertion, progressive bilateral lower EXTR edema, two-pillow orthopnea, ongoing for about 2 weeks.  He has been sleeping on a recliner.  Reports inability to use CPAP at home.  In the ED, patient was tachycardic and tachypneic , on 4 his oxygen  which is his home requirement Labs with creatinine 2.21, baseline 1.9 from 10/9, BNP 829 Chest x-ray  with basilar atelectasis Given IV Lasix  80 mg Admitted to [facility] course: Acute exacerbation of chronic systolic CHF Pulmonary hypertension HTN Presented with volume overload symptoms, elevated BNP It seems patient had RHC/LHC done in 2019.  Already had moderate to severe pulmonary hypertension at that time. Echo from May 2025 and during this hospitalization 10/29 showed EF 40 to 45%, elevated PASP at 57 mmHg. PTA meds- metoprolol , hydralazine , Imdur , losartan , torsemide, Aldactone  Cardiology consult was obtained Aggressive diuresis with IV Lasix .  More than 10 L of negative balance since admission.  Patient has been adequately diuresed and now on oral diuretics.  Creatinine 2.86 today, higher than at presentation but stable for last 3 days. Discussed with cardiology this morning regarding the titration of his blood pressure medicines and diuretics. We plan to discharge him home on Toprol  100 mg daily, Imdur  30 mg daily, torsemide 20 mg twice daily with KCl 20 mEq twice daily. Previously, he was on hydralazine , losartan  and Aldactone  as well which have been stopped He has a follow-up appointment with cardiology on 12/27/2023  A-fib with RVR  Paroxysmal A-fib Patient has been in and out of A-fib with RVR.   Previously on Toprol  100 mg twice daily.  It was reduced to 100 mg daily because of bradycardia.  Amiodarone was added. Plan to discharge on Toprol  100 mg daily and amiodarone (400 mg twice daily until 11/7, then changed to 200 mg daily starting 11/8) He was previously on Xarelto .  Given unstable creatinine, decision was made to switch him to Eliquis twice daily.  Aspirin  has been stopped while on anticoagulation.  HLD PTA ASA 81mg  daily, atorvastatin  40mg   daily Left heart cath from 2019 with nonobstructive disease. Per cardiology, no need of aspirin  while on anticoagulation. Continue statin  Type 2 diabetes mellitus A1c 5.7 on 08/19/2023 PTA meds-Mounjaro , metformin  twice  daily In the hospital, his blood sugar level has remained normal without any antidiabetic medicines.  Given elevated creatinine, I would stop metformin   Chronic respiratory failure  OSA/OHS on 3-4L home O2 Remains on baseline requirement.  Hopefully with adequate diuresis, he can be weaned down at home.  DVT/PE Continue chronic anticoagulation  H/o prostate cancer Chronic urinary retention s/p chronic suprapubic catheter Patient follows up with urology at [facility] and gets suprapubic catheter changed monthly.   10/28, changed by RN in the hospital.  Morbid Obesity  Body mass index is 43.92 kg/m. Patient has been advised to make an attempt to improve diet and exercise patterns to aid in weight loss.   Impaired mobility  wheelchair-bound mostly but able to walk inside the room.   PT eval obtained.  Recommended home with PT.  Goals of care   Code Status: Full Code   Diet:  Diet Order             Diet - low sodium heart healthy           Diet 2 gram sodium Room service appropriate? Yes; Fluid consistency: Thin  Diet effective now                   Nutritional status:  Body mass index is 43.92 kg/m.       Wounds:  -    Discharge Medications:   Allergies as of 12/22/2023       Reactions   Lisinopril  Cough   Muscle pain        Medication List     STOP taking these medications    aspirin  EC 81 MG tablet   hydrALAZINE  25 MG tablet Commonly known as: APRESOLINE    losartan  50 MG tablet Commonly known as: COZAAR    metFORMIN  500 MG tablet Commonly known as: GLUCOPHAGE    metoprolol  tartrate 100 MG tablet Commonly known as: LOPRESSOR    spironolactone  25 MG tablet Commonly known as: ALDACTONE    Xarelto  20 MG Tabs tablet Generic drug: rivaroxaban        TAKE these medications    Accu-Chek Aviva Plus test strip Generic drug: glucose blood Use to test blood sugar daily.   Accu-Chek Softclix Lancets lancets Use to test blood sugar daily    amiodarone 200 MG tablet Commonly known as: PACERONE Take 2 tablets (400 mg total) by mouth 2 (two) times daily for 3 days, THEN 1 tablet (200 mg total) daily. Start taking on: December 22, 2023   apixaban 5 MG Tabs tablet Commonly known as: ELIQUIS Take 1 tablet (5 mg total) by mouth 2 (two) times daily.   atorvastatin  40 MG tablet Commonly known as: LIPITOR TAKE 1 TABLET EVERY DAY   brimonidine 0.2 % ophthalmic solution Commonly known as: ALPHAGAN Place 1 drop into the left eye 2 (two) times daily.   calcium  carbonate 500 MG chewable tablet Commonly known as: TUMS - dosed in mg elemental calcium  Chew 2 tablets by mouth as needed for indigestion or heartburn.   isosorbide  mononitrate 30 MG 24 hr tablet Commonly known as: IMDUR  Take 1 tablet (30 mg total) by mouth daily.   metoprolol  succinate 100 MG 24 hr tablet Commonly known as: TOPROL -XL Take 1 tablet (100 mg total) by mouth daily. Take with or immediately following a meal.  Mounjaro  15 MG/0.5ML Pen Generic drug: tirzepatide  Inject 15 mg into the skin once a week. What changed: when to take this   MULTIVITAMIN ADULT PO Take 2 each by mouth daily as needed (immune system).   potassium chloride  SA 20 MEQ tablet Commonly known as: KLOR-CON  M TAKE 1 TABLET TWICE DAILY   Senna 8.7 MG Chew Chew 1 tablet by mouth as needed.   torsemide 20 MG tablet Commonly known as: DEMADEX Take 1 tablet (20 mg) by mouth in the morning and 1 tablet (20 mg) in the early afternoon.               Durable Medical Equipment  (From admission, onward)           Start     Ordered   12/17/23 1555  For home use only DME Walker rolling  Once       Comments: Bariatric  Question Answer Comment  Walker: With 5 Inch Wheels   Patient needs a walker to treat with the following condition Weakness      12/17/23 1555   12/16/23 1449  For home use only DME Hospital bed  Once       Question Answer Comment  Length of Need Lifetime    Patient has (list medical condition): CHF   The above medical condition requires: Patient requires the ability to reposition frequently   Head must be elevated greater than: 30 degrees   Bed type Semi-electric   Support Surface: Gel Overlay      12/16/23 1449             Follow ups:    Follow-up Information     [facility]. Go in 9 day(s).   Specialty: Cardiology Why: Hospital follow up 12/27/2023 @ 10:15 am PLEASE bring a current medication list to appointment FREE valet parking, Entrance C, off [address] for women and [facility] entrance Contact information: [address] [place]  [phone]        [facility]  Follow up.   Why: hospital bed Contact information: [address] [place] [address] [phone]         [name], NP Follow up.   Specialty: Geriatric Medicine Contact information: [address] [place] [address] [phone]                 Discharge Instructions:   Discharge Instructions     Call MD for:  difficulty breathing, headache or visual disturbances   Complete by: As directed    Call MD for:  extreme fatigue   Complete by: As directed    Call MD for:  hives   Complete by: As directed    Call MD for:  persistant dizziness or light-headedness   Complete by: As directed    Call MD for:  persistant nausea and vomiting   Complete by: As directed    Call MD for:  severe uncontrolled pain   Complete by: As directed    Call MD for:  temperature >100.4   Complete by: As directed    Diet - low sodium heart healthy   Complete by: As directed    Discharge instructions   Complete by: As directed    Recommendations at discharge:   Heart failure medicines at discharge: Toprol  100 mg daily, Imdur  30 mg daily, torsemide 20 mg twice daily with KCl 20 mEq twice daily.  Hydralazine , losartan  and  Aldactone  as well which have been stopped  Amiodarone 400 mg twice daily until 11/7, then changed to 200 mg daily starting 11/8  Xarelto  has been switched to Eliquis.  Stop aspirin   Metformin  has been stopped.  Follow-up with cardiology as an outpatient.  You have an appointment on 12/27/2023.   You have been started on a blood thinner.  Please watch out for any obvious bleeding, black stool or easy bruisability.  Please contact your primary care provider for further recommendation.    Discharge instructions for CHF Check weight daily -preferably same time every day. Restrict fluid intake to 1200 ml daily Restrict salt intake to less than 2 g daily. Call MD if you have one of the following symptoms 1) 3 pound weight gain in 24 hours or 5 pounds in 1 week  2) swelling in the hands, feet or stomach  3) progressive shortness of breath 4) if you have to sleep on extra pillows at night in order to breathe       General discharge instructions: Follow with Primary MD [name], NP in 7 days  Please request your PCP  to go over your hospital tests, procedures, radiology results at the follow up. Please get your medicines reviewed and adjusted.  Your PCP may decide to repeat certain labs or tests as needed. Do not drive, operate heavy machinery, perform activities at heights, swimming or participation in water activities or provide baby sitting services if your were admitted for syncope or siezures until you have seen by Primary MD or a Neurologist and advised to do so again. [place]  Controlled Substance Reporting System database was reviewed. Do not drive, operate heavy machinery, perform activities at heights, swim, participate in water activities or provide baby-sitting services while on medications for pain, sleep and mood until your outpatient physician has reevaluated you and advised to do so again.  You are strongly recommended to comply with the dose, frequency and  duration of prescribed medications. Activity: As tolerated with Full fall precautions use walker/cane & assistance as needed Avoid using any recreational substances like cigarette, tobacco, alcohol , or non-prescribed drug. If you experience worsening of your admission symptoms, develop shortness of breath, life threatening emergency, suicidal or homicidal thoughts you must seek medical attention immediately by calling 911 or calling your MD immediately  if symptoms less severe. You must read complete instructions/literature along with all the possible adverse reactions/side effects for all the medicines you take and that have been prescribed to you. Take any new medicine only after you have completely understood and accepted all the possible adverse reactions/side effects.  Wear Seat belts while driving. You were cared for by a hospitalist during your hospital stay. If you have any questions about your discharge medications or the care you received while you were in the hospital after you are discharged, you can call the unit and ask to speak with the hospitalist or the covering physician. Once you are discharged, your primary care physician will handle any further medical issues. Please note that NO REFILLS for any discharge medications will be authorized once you are discharged, as it is imperative that you return to your primary care physician (or establish a relationship with a primary care physician if you do not have one).   Increase activity slowly   Complete by: As directed        Discharge Exam:   Vitals:   12/21/23 2024 12/22/23 0003 12/22/23 0429 12/22/23 0715  BP: 123/63 132/67 130/72 139/80  Pulse: (!) 56 (!) 56 (!) 54 (!) 51  Resp: (!) 0 20 20 18   Temp: (!) 97.4 F (36.3 C) (!) 97.5 F (36.4 C) 97.7 F (36.5 C) (!) 97.4 F (36.3 C)  TempSrc: Oral Oral Oral Oral  SpO2: 95% 98% 95% 98%  Weight:      Height:        Body mass index is 43.92 kg/m.   General exam: Pleasant,  elderly African-American male, morbidly obese.  Not in distress Skin: No rashes, lesions or ulcers. HEENT: Atraumatic, normocephalic, no obvious bleeding Lungs: Clear to auscultation bilaterally CVS: S1, S2, no murmur,   GI/Abd: Soft, nontender, nondistended, bowel sound present,   CNS: Alert, awake, oriented x 3 Psychiatry: Mood appropriate Extremities: Improving bilateral pedal edema, no calf tenderness,    The results of significant diagnostics from this hospitalization (including imaging, microbiology, ancillary and laboratory) are listed below for reference.    Procedures and Diagnostic Studies:   DG Chest 2 View Result Date: 12/13/2023 CLINICAL DATA:  Worsening shortness of breath for 3-4 days. EXAM: CHEST - 2 VIEW COMPARISON:  Radiographs 08/03/2023 and 08/02/2023. Chest CT 06/11/2022. FINDINGS: There is chronic elevation of the left hemidiaphragm with associated left basilar atelectasis or scarring. Overall left base aeration appears improved compared with the previous examination. The lungs are otherwise clear. There is no pleural effusion or pneumothorax. The heart size and mediastinal contours are stable. Degenerative changes in the spine without evidence of acute osseous abnormality. IMPRESSION: Chronic elevation of the left hemidiaphragm with associated left basilar atelectasis or scarring. No acute cardiopulmonary process. Electronically Signed   By: Elsie Perone M.D.   On: 12/13/2023 12:50     Labs:   Basic Metabolic Panel: Recent Labs  Lab 12/18/23 0246 12/19/23 0739 12/20/23 0248 12/21/23 0922 12/22/23 0350  NA 142 141 140 142 141  K 3.5 4.2 4.1 4.3 3.9  CL 101 101 102 104 101  CO2 27 24 24 24 27   GLUCOSE 111* 115* 106* 125* 141*  BUN 43* 49* 51* 52* 50*  CREATININE 2.40* 2.85* 2.84* 2.76* 2.86*  CALCIUM  8.8* 8.7* 8.8* 8.9 8.9   GFR Estimated Creatinine Clearance: 31.4 mL/min (A) (by C-G formula based on SCr of 2.86 mg/dL (H)). Liver Function Tests: No  results for input(s): AST, ALT, ALKPHOS, BILITOT, PROT, ALBUMIN in the last 168 hours. No results for input(s): LIPASE, AMYLASE in the last 168 hours. No results for input(s): AMMONIA in the last 168 hours. Coagulation profile No results for input(s): INR, PROTIME in the last 168 hours.  CBC: Recent Labs  Lab 12/16/23 0244 12/17/23 0250  WBC 6.9 6.8  NEUTROABS 4.9 4.3  HGB 11.6* 11.8*  HCT 36.6* 37.0*  MCV 92.9 90.9  PLT 208 238   Cardiac Enzymes: No results for input(s): CKTOTAL, CKMB, CKMBINDEX, TROPONINI in the last 168 hours. BNP: Invalid input(s): POCBNP CBG: No results for input(s): GLUCAP in the last 168 hours. D-Dimer No results for input(s): DDIMER in the last 72 hours. Hgb A1c No results for input(s): HGBA1C in the last 72 hours. Lipid Profile No results for input(s): CHOL, HDL, LDLCALC, TRIG, CHOLHDL, LDLDIRECT in the last 72 hours. Thyroid  function studies No results for input(s): TSH, T4TOTAL, T3FREE, THYROIDAB in the last 72 hours.  Invalid input(s): FREET3 Anemia work up No results for input(s): VITAMINB12, FOLATE, FERRITIN, TIBC, IRON , RETICCTPCT in the last 72 hours. Microbiology No results found for this or any previous visit (from the past 240 hours).  Time  coordinating discharge: 45 minutes  Signed: Monicka Cyran  [facility] 12/22/2023, 10:29 AM

## 2023-12-22 NOTE — Progress Notes (Addendum)
 12/22/2023 8:35 AM ----------------------------------------------Patient assessed by the [facility]------------------------------------   Chart reviewed:Yes   Documentation gaps: NONE  Labs, test, and orders reviewed: YES  30-day Readmission: No  Discharge order: No, EDD listed for today 12/22/2023  Current discharge plan: Home with [facility]    Barrier to discharge before 11am: Need to confirm delivery of RW and Hospital Bed prior to discharge home.  10:31 AM Confirmed with RN CM ok for patient to be discharged home prior to DME delivery. Pt. Has additional DME resources at home. Primary RN updated as well. No further discharge barriers noted at this time.    Intervention provided by [facility] team: EPIC Secure Chat message to primary RN. RN to speak with patient and encourage family member at home to let staff know when equipment is delivered as not to delay discharge plans. Offered assistance to RN if there is an equipment delivery delay.    Barrier resolved: No, not at this time. Awaiting equipment delivery today 12/22/2023. 10:32 AM Barrier resolved, ok to discharge patient home.    Olie Dibert, RN [employer]

## 2023-12-22 NOTE — Progress Notes (Signed)
 Rounding Note    Patient Name: Thomas [name] Date of Encounter: 12/22/2023  [facility] Cardiologist: Stanly DELENA Leavens, MD   Subjective   Feeling better today. Reports very good urine output yesterday on oral diuretic. No lightheadedness. Hoping to go home today.  Inpatient Medications    Scheduled Meds:  amiodarone  400 mg Oral BID   Followed by   NOREEN ON 12/25/2023] amiodarone  200 mg Oral Daily   apixaban  5 mg Oral BID   atorvastatin   40 mg Oral Daily   brimonidine  1 drop Left Eye BID   isosorbide  mononitrate  30 mg Oral Daily   metoprolol  succinate  100 mg Oral Daily   potassium chloride   40 mEq Oral BID   torsemide  20 mg Oral BID   Continuous Infusions:  PRN Meds:    Vital Signs    Vitals:   12/21/23 2024 12/22/23 0003 12/22/23 0429 12/22/23 0715  BP: 123/63 132/67 130/72 139/80  Pulse: (!) 56 (!) 56 (!) 54 (!) 51  Resp: (!) 0 20 20 18   Temp: (!) 97.4 F (36.3 C) (!) 97.5 F (36.4 C) 97.7 F (36.5 C) (!) 97.4 F (36.3 C)  TempSrc: Oral Oral Oral Oral  SpO2: 95% 98% 95% 98%  Weight:      Height:        Intake/Output Summary (Last 24 hours) at 12/22/2023 0947 Last data filed at 12/22/2023 0700 Gross per 24 hour  Intake 540 ml  Output 2225 ml  Net -1685 ml      12/20/2023    9:38 AM 12/19/2023    6:22 AM 12/18/2023    6:07 AM  Last 3 Weights  Weight (lbs) 297 lb 6.4 oz 294 lb 1.6 oz 302 lb 4 oz  Weight (kg) 134.9 kg 133.403 kg 137.1 kg      Telemetry    Sinus brady - Personally Reviewed  Physical Exam   GEN: Well nourished, well developed in no acute distress NECK: No JVD CARDIAC: regular rhythm, normal S1 and S2, no rubs or gallops. No murmur. VASCULAR: Radial pulses 2+ bilaterally.  RESPIRATORY:  Distant but largely clear ABDOMEN: Soft, non-tender, non-distended MUSCULOSKELETAL:  Moves all 4 limbs independently SKIN: Warm and dry, no pitting edema NEUROLOGIC:  No focal neuro deficits noted. PSYCHIATRIC:   Normal affect    New pertinent results (labs, ECG, imaging, cardiac studies)     Assessment & Plan    Acute on chronic systolic and diastolic heart failure Chronic kidney disease stage 3b with acute kidney injury this admission Pulmonary hypertension, group 2/3 -GDMT with hydralazine , imdur , metoprolol  succinate. Blood pressure dipped overnight, had several doses of hydralazine  held. Reasonable to hold this for a time at discharge until follow up. -was on losartan , spironolactone  at home, held for worsening renal function -no SGLT2i with chronic catheter -admission weight 140.2 kg, no weight in 2 days, charted net negative ~10L -Cr 2.86, elevated from baseline but has been at this level for about 4 days. IV diuresis held for several days, restarted his home oral torsemide yesterday with good urine output.   Paroxysmal atrial fibrillation -on amiodarone, apixaban in patient (previously on rivaroxaban ), changed due to renal function  Obesity OSA -continue mounjaro  as outpatient  Nonobstructive CAD:  -no aspirin  given DOAC. On atorvastatin   [facility] will sign off.   Medication Recommendations:   Amiodarone 400 mg BID until 11/7, then change to 200 mg daily starting 11/8 Apixaban 5 mg BID (  replaces rivaroxaban  due to renal function) Isosorbide  30 mg daily Metoprolol  succinate 100 mg daily (reduced from tartrate 100 mg BID due to bradycardia) Torsemide 20 mg BID Potassium 20 mEq BID  HOLD hydralazine  25 mg TID until follow up visit.   STOP: aspirin , losartan , spironolactone  Other recommendations (labs, testing, etc):  BMET at follow up Follow up as an outpatient:  He has follow up scheduled with [facility] on 12/27/23    Signed, Shelda Bruckner, MD  12/22/2023, 9:47 AM

## 2023-12-22 NOTE — Patient Outreach (Signed)
 Returned call to wife Montrey Buist concerning questions about having a hospital bed ordered for patient (patient previously provided verbal consent to speak with wife Heron anytime needed to discuss his personal health). Discussed with wife Heron, per chart review, an order for a hospital bed has been placed and is planned to be delivered to patient's home prior to his discharge from the hospital. Discussed patient is scheduled to discharge home today, however patient does not want a hospital bed at this time due to his breathing has greatly improved since getting his HF under better control. Discussed wife Heron will contact the hospital CM, Debbie and make her aware and cancel the order. Discussed once patient returns home and decides he needs the hospital bed, he will notify this RN to assist with care coordination for DME needs.  Reviewed and discussed next scheduled follow up call with this RN is scheduled for this Friday, 12/24/23 at 10:30 AM.   Clayborne Ly RN BSN CCM [place]  [facility], [facility] Nurse Care Coordinator  Direct Dial: [phone] Website: [email or web address]

## 2023-12-22 NOTE — Patient Instructions (Signed)
 Visit Information  Thank you for taking time to visit with me today. Please don't hesitate to contact me if I can be of assistance to you before our next scheduled appointment.  Your next care management appointment is by telephone on Friday, November 7 at 10:30 AM  Please call the care guide team at [phone] if you need to cancel, schedule, or reschedule an appointment.   Please call 1-800-273-TALK (toll free, 24 hour hotline) if you are experiencing a Mental Health or Behavioral Health Crisis or need someone to talk to.  Clayborne Ly RN BSN CCM [place]  [facility], [facility] Health Nurse Care Coordinator  Direct Dial: [phone] Website: [email or web address]

## 2023-12-23 ENCOUNTER — Telehealth: Payer: Self-pay

## 2023-12-23 NOTE — Transitions of Care (Post Inpatient/ED Visit) (Signed)
   12/23/2023  Name: Thomas [name] MRN: [id number] DOB: [birth year]  Today's TOC FU Call Status: Today's TOC FU Call Status:: Unsuccessful Call (1st Attempt) (Spoke with wife who states she is currently in [facility] with her daughter and agreed to [facility] RN call tomorrow)  Attempted to reach the patient regarding the most recent Inpatient/ED visit.  Follow Up Plan: Additional outreach attempts will be made to reach the patient to complete the Transitions of Care (Post Inpatient/ED visit) call.   Shona Prow RN, CCM [place]  VBCI-Population Health RN Care Manager [phone]

## 2023-12-23 NOTE — Telephone Encounter (Signed)
 Called pt and asked him to relay message to son (no dpr on file) that FMLA will need to go through the heart failure clinic.  He will let his son know and they can address at next weeks appt.   He appreciates the return call.

## 2023-12-23 NOTE — Telephone Encounter (Signed)
 Patient was recently hospitalized and has follow up with heart failure clinic. This will need to be deferred to them per Dr. Santo.

## 2023-12-24 ENCOUNTER — Other Ambulatory Visit: Payer: Self-pay

## 2023-12-24 ENCOUNTER — Telehealth: Payer: Self-pay

## 2023-12-24 VITALS — Wt 285.0 lb

## 2023-12-24 DIAGNOSIS — I272 Pulmonary hypertension, unspecified: Secondary | ICD-10-CM

## 2023-12-24 DIAGNOSIS — I509 Heart failure, unspecified: Secondary | ICD-10-CM

## 2023-12-24 NOTE — Patient Instructions (Signed)
 Visit Information  Thank you for taking time to visit with me today. Please don't hesitate to contact me if I can be of assistance to you before our next scheduled telephone appointment.  Our next appointment is by telephone on 12/31/23 in the morning  Following is a copy of your care plan:   Goals Addressed             This Visit's Progress    VBCI Transitions of Care (TOC) Care Plan       Problems:  Recent Hospitalization for treatment of Admit/Discharge Date   10/27- 11/5  Thomas [name]    Primary Diagnosis: Acute on chronic heart failure    Goal:  Over the next 30 days, the patient will not experience hospital readmission  Interventions:  Transitions of Care: Doctor Visits  - discussed the importance of doctor visits Reviewed the following discharge instructions with patient and wife: Discharge instructions for CHF Check weight daily -preferably same time every day. Restrict fluid intake to 1200 ml daily Restrict salt intake to less than 2 g daily. Call MD if you have one of the following symptoms 1) 3 pound weight gain in 24 hours or 5 pounds in 1 week  2) swelling in the hands, feet or stomach  3) progressive shortness of breath 4) if you must sleep on extra pillows at night in order to breathe [facility] RN asked about [employer] which was documented and patient/wife state patient does not have them and denied need stating no wounds and swelling has improved Reviewed hospital follow up appts - PCP and Heart & vascular are both 12/27/23 Reviewed oxygen  and patient confirms 3 liters O2 and reports O2 sat is 95-97% - Wife reports weight is 285lbs BP 125/81 HR 52 and states HR is often low - noted HR of 51 when inpatient - denied any symptoms TOC RN discussed TOC vs CCM and patient/wife agreeable to put CCM on hold and follow for [facility] with plan to return to CCM, [name] at end of [facility] program   Heart Failure Interventions: Basic overview and discussion of pathophysiology of Heart  Failure reviewed Provided education on low sodium diet Assessed need for readable accurate scales in home Provided education about placing scale on hard, flat surface Advised patient to weigh each morning after emptying bladder Discussed importance of daily weight and advised patient to weigh and record daily Reviewed role of diuretics in prevention of fluid overload and management of heart failure; Discussed the importance of keeping all appointments with provider Assessed social determinant of health barriers   Patient Self Care Activities:  Attend all scheduled provider appointments Call pharmacy for medication refills 3-7 days in advance of running out of medications Call provider office for new concerns or questions  Notify RN Care Manager of TOC call rescheduling needs Participate in Transition of Care Program/Attend TOC scheduled calls Take medications as prescribed   call office if I gain more than 2 pounds in one day or 5 pounds in one week keep legs up while sitting track weight in diary use salt in moderation watch for swelling in feet, ankles and legs every day weigh myself daily  Plan:  Telephone follow up appointment with care management team member scheduled for:  12/31/23 in the morning The patient has been provided with contact information for the care management team and has been advised to call with any health related questions or concerns.         Patient verbalizes understanding  of instructions and care plan provided today and agrees to view in MyChart. Active MyChart status and patient understanding of how to access instructions and care plan via MyChart confirmed with patient.     Telephone follow up appointment with care management team member scheduled for: 12/31/23 The patient has been provided with contact information for the care management team and has been advised to call with any health related questions or concerns.   Please call the care guide  team at [phone] if you need to cancel or reschedule your appointment.   Please call the Suicide and Crisis Lifeline: 988 call 1-800-273-TALK (toll free, 24 hour hotline) call 911 if you are experiencing a Mental Health or Behavioral Health Crisis or need someone to talk to.  Shona Prow RN, CCM [place]  VBCI-Population Health RN Care Manager [phone]

## 2023-12-24 NOTE — Transitions of Care (Post Inpatient/ED Visit) (Signed)
 12/24/2023  Name: Thomas [name] MRN: [id number] DOB: 08-Sep-1950  Today's TOC FU Call Status: Today's TOC FU Call Status:: Successful TOC FU Call Completed TOC FU Call Complete Date: 12/24/23 Patient's Name and Date of Birth confirmed.  Transition Care Management Follow-up Telephone Call How have you been since you were released from the hospital?: Better Any questions or concerns?: No  Items Reviewed: Did you receive and understand the discharge instructions provided?: Yes Medications obtained,verified, and reconciled?: Yes (Medications Reviewed) Any new allergies since your discharge?: No Dietary orders reviewed?: Yes Type of Diet Ordered:: Low sodium heart healthy Do you have support at home?: Yes People in Home [RPT]: spouse Name of Support/Comfort Primary Source: wife, [place]  Medications Reviewed Today: Medications Reviewed Today     Reviewed by Lauro Shona LABOR, RN (Registered Nurse) on 12/24/23 at 1522  Med List Status: <None>   Medication Order Taking? Sig Documenting Provider Last Dose Status Informant  Accu-Chek Softclix Lancets lancets [id number] Yes Use to test blood sugar daily [name], NP  Active Self, Pharmacy Records  amiodarone (PACERONE) 200 MG tablet [id number] Yes Take 2 tablets (400 mg total) by mouth 2 (two) times daily for 3 days, THEN 1 tablet (200 mg total) daily. Arlice Reichert, MD  Active   apixaban (ELIQUIS) 5 MG TABS tablet [id number] Yes Take 1 tablet (5 mg total) by mouth 2 (two) times daily. Arlice Reichert, MD  Active   atorvastatin  (LIPITOR) 40 MG tablet [id number] Yes TAKE 1 TABLET EVERY DAY [name], Jessica K, NP  Active Self, Pharmacy Records  brimonidine (ALPHAGAN) 0.2 % ophthalmic solution [id number] Yes Place 1 drop into the left eye 2 (two) times daily.   Active Self, Pharmacy Records  calcium  carbonate (TUMS - DOSED IN MG ELEMENTAL CALCIUM ) 500 MG chewable tablet [id number]  Chew 2 tablets by mouth as needed for indigestion or  heartburn.  Patient not taking: Reported on 12/24/2023   [provider]  Active Self, Pharmacy Records  glucose blood (ACCU-CHEK AVIVA PLUS) test strip [id number] Yes Use to test blood sugar daily. [name], NP  Active Self, Pharmacy Records  isosorbide  mononitrate (IMDUR ) 30 MG 24 hr tablet [id number] Yes Take 1 tablet (30 mg total) by mouth daily. [name], NP  Active Self, Pharmacy Records  metoprolol  succinate (TOPROL -XL) 100 MG 24 hr tablet [id number] Yes Take 1 tablet (100 mg total) by mouth daily. Take with or immediately following a meal. Dahal, Reichert, MD  Active   Multiple Vitamin (MULTIVITAMIN ADULT PO) [id number]  Take 2 each by mouth daily as needed (immune system).  Patient not taking: Reported on 12/24/2023   [provider]  Active Self, Pharmacy Records  OXYGEN  [id number] Yes Inhale 3 L into the lungs continuous. [provider]  Active   potassium chloride  SA (KLOR-CON  M) 20 MEQ tablet [id number] Yes TAKE 1 TABLET TWICE DAILY Caro, Jessica K, NP  Active Self, Pharmacy Records           Med Note (SATTERFIELD, TEENA BRAVO   Tue Aug 03, 2023  5:39 PM)    Senna 8.7 MG CHEW [id number] Yes Chew 1 tablet by mouth as needed. [name], NP  Active Self, Pharmacy Records  tirzepatide  (MOUNJARO ) 15 MG/0.5ML Pen [id number] Yes Inject 15 mg into the skin once a week. Santo Stanly LABOR, MD  Active Self, Pharmacy Records           Med Note (SATTERFIELD, TEENA BRAVO   Tue Aug 03, 2023  5:36 PM) Take on sundays  torsemide (DEMADEX) 20 MG tablet [id number] Yes Take 1 tablet (20 mg) by mouth in the morning and 1 tablet (20 mg) in the early afternoon. Campbell, Kenzie E, NP  Active Self, Pharmacy Records            Home Care and Equipment/Supplies: Were Home Health Services Ordered?: Yes Name of Home Health Agency:: [facility] [address] Has Agency set up a time to come to your home?: Yes First Home Health Visit Date: 12/28/23 Any new equipment or  medical supplies ordered?: No  Functional Questionnaire: Do you need assistance with bathing/showering or dressing?: No Do you need assistance with meal preparation?: No Do you need assistance with eating?: No Do you have difficulty maintaining continence: No Do you need assistance with getting out of bed/getting out of a chair/moving?: No Do you have difficulty managing or taking your medications?: No  Follow up appointments reviewed: PCP Follow-up appointment confirmed?: Yes Date of PCP follow-up appointment?: 12/27/23 Follow-up Provider: PCP Specialist Hospital Follow-up appointment confirmed?: Yes Date of Specialist follow-up appointment?: 12/27/23 Follow-Up Specialty Provider:: heart and vascular Do you need transportation to your follow-up appointment?: No Do you understand care options if your condition(s) worsen?: Yes-patient verbalized understanding  SDOH Interventions Today    Flowsheet Row Most Recent Value  SDOH Interventions   Food Insecurity Interventions Intervention Not Indicated  Housing Interventions Intervention Not Indicated  Transportation Interventions Intervention Not Indicated  Utilities Interventions Intervention Not Indicated    Goals Addressed             This Visit's Progress    VBCI Transitions of Care (TOC) Care Plan       Problems:  Recent Hospitalization for treatment of Admit/Discharge Date   10/27- 11/5  Jolynn Pack    Primary Diagnosis: Acute on chronic heart failure    Goal:  Over the next 30 days, the patient will not experience hospital readmission  Interventions:  Transitions of Care: Doctor Visits  - discussed the importance of doctor visits Reviewed the following discharge instructions with patient and wife: Discharge instructions for CHF Check weight daily -preferably same time every day. Restrict fluid intake to 1200 ml daily Restrict salt intake to less than 2 g daily. Call MD if you have one of the following symptoms 1)  3 pound weight gain in 24 hours or 5 pounds in 1 week  2) swelling in the hands, feet or stomach  3) progressive shortness of breath 4) if you must sleep on extra pillows at night in order to breathe [facility] RN asked about [employer] which was documented and patient/wife state patient does not have them and denied need stating no wounds and swelling has improved Reviewed hospital follow up appts - PCP and Heart & vascular are both 12/27/23 Reviewed oxygen  and patient confirms 3 liters O2 and reports O2 sat is 95-97% - Wife reports weight is 285lbs BP 125/81 HR 52 and states HR is often low - noted HR of 51 when inpatient - denied any symptoms TOC RN discussed TOC vs CCM and patient/wife agreeable to put CCM on hold and follow for [facility] with plan to return to CCM, [name] at end of [facility] program   Heart Failure Interventions: Basic overview and discussion of pathophysiology of Heart Failure reviewed Provided education on low sodium diet Assessed need for readable accurate scales in home Provided education about placing scale on hard, flat surface Advised patient to weigh each morning after emptying bladder Discussed importance  of daily weight and advised patient to weigh and record daily Reviewed role of diuretics in prevention of fluid overload and management of heart failure; Discussed the importance of keeping all appointments with provider Assessed social determinant of health barriers   Patient Self Care Activities:  Attend all scheduled provider appointments Call pharmacy for medication refills 3-7 days in advance of running out of medications Call provider office for new concerns or questions  Notify RN Care Manager of TOC call rescheduling needs Participate in Transition of Care Program/Attend TOC scheduled calls Take medications as prescribed   call office if I gain more than 2 pounds in one day or 5 pounds in one week keep legs up while sitting track weight in diary use salt in  moderation watch for swelling in feet, ankles and legs every day weigh myself daily  Plan:  Telephone follow up appointment with care management team member scheduled for:  12/31/23 in the morning The patient has been provided with contact information for the care management team and has been advised to call with any health related questions or concerns.         Shona Prow RN, CCM [place]  VBCI-Population Health RN Care Manager [phone]

## 2023-12-24 NOTE — Patient Outreach (Addendum)
 Complex Care Management   Visit Note  12/24/2023  Name:  Thomas [name] MRN: [id number] DOB: [birth year]  Situation: Referral received for Complex Care Management related to CHF, Pulmonary Hypertension, OSA w/CPAP, PAF (paroxysmal atrial fibrillation), Urinary Retention with Suprapubic Catheter. I obtained verbal consent from Patient.  Visit completed with Patient on the phone.  Background:   Past Medical History:  Diagnosis Date   Acute on chronic diastolic CHF (congestive heart failure) (HCC) 06/01/2017   Arthritis    Cancer (HCC) 2010   Prostate   Chronic kidney disease    Diabetes mellitus    Diabetic neuropathy (HCC)    feet   DVT (deep venous thrombosis) (HCC)    Genetic testing 06/22/2016   Mr. Mcandrew underwent genetic counseling and testing for hereditary cancer syndromes on 05/14/2016. His results were negative for mutations in all 46 genes analyzed by [facility]'s 46-gene Common Hereditary Cancers Panel. Genes analyzed include: APC, ATM, AXIN2, BARD1, BMPR1A, BRCA1, BRCA2, BRIP1, CDH1, CDKN2A, CHEK2, CTNNA1, DICER1, EPCAM, GREM1, HOXB13, KIT, MEN1, MLH1, MSH2, MSH3, MSH6, MUTYH, NB   GERD (gastroesophageal reflux disease)    Hypertension    PE (pulmonary thromboembolism) (HCC)    Pneumonia    Sleep apnea    not wearing CPAP   Suprapubic catheter (HCC) 11/17/2019    Assessment: Patient Reported Symptoms:  Cognitive Cognitive Status: Alert and oriented to person, place, and time, Normal speech and language skills Cognitive/Intellectual Conditions Management [RPT]: None reported or documented in medical history or problem list   Health Maintenance Behaviors: Annual physical exam, Healthy diet Health Facilitated by: Rest, Healthy diet  Neurological Neurological Review of Symptoms: No symptoms reported    HEENT HEENT Symptoms Reported: No symptoms reported      Cardiovascular Cardiovascular Symptoms Reported: Swelling in legs or feet Does patient have uncontrolled  Hypertension?: No Cardiovascular Management Strategies: Adequate rest, Routine screening, Medication therapy Cardiovascular Self-Management Outcome: 4 (good) Cardiovascular Comment: Re-educated patient re: HF action plan  Respiratory Respiratory Symptoms Reported: Shortness of breath Respiratory Management Strategies: Oxygen  therapy, Adequate rest, Medication therapy, CPAP Respiratory Self-Management Outcome: 4 (good)  Endocrine Endocrine Symptoms Reported: No symptoms reported Is patient diabetic?: Yes (Prediabetes) Is patient checking blood sugars at home?: No Endocrine Self-Management Outcome: 4 (good)  Gastrointestinal Gastrointestinal Symptoms Reported: No symptoms reported      Genitourinary Genitourinary Symptoms Reported: Other Other Genitourinary Symptoms: BPH with obstruction/lower urinary tract symptoms; Indwelling Catheter Genitourinary Management Strategies: Catheter, indwelling Genitourinary Self-Management Outcome: 4 (good)  Integumentary Integumentary Symptoms Reported: Not assessed    Musculoskeletal Musculoskelatal Symptoms Reviewed: Difficulty walking, Limited mobility, Unsteady gait, Weakness Musculoskeletal Management Strategies: Adequate rest, Medical device, Routine screening Musculoskeletal Self-Management Outcome: 4 (good)      Psychosocial Psychosocial Symptoms Reported: No symptoms reported   Major Change/Loss/Stressor/Fears (CP): Medical condition, self Techniques to Cope with Loss/Stress/Change:  (family support) Quality of Family Relationships: involved, supportive, helpful Do you feel physically threatened by others?: No    12/24/2023    PHQ2-9 Depression Screening   Phylisha Dix interest or pleasure in doing things    Feeling down, depressed, or hopeless    PHQ-2 - Total Score    Trouble falling or staying asleep, or sleeping too much    Feeling tired or having Dennie Moltz energy    Poor appetite or overeating     Feeling bad about yourself - or that you  are a failure or have let yourself or your family down    Trouble concentrating on things, such as reading  the newspaper or watching television    Moving or speaking so slowly that other people could have noticed.  Or the opposite - being so fidgety or restless that you have been moving around a lot more than usual    Thoughts that you would be better off dead, or hurting yourself in some way    PHQ2-9 Total Score    If you checked off any problems, how difficult have these problems made it for you to do your work, take care of things at home, or get along with other people    Depression Interventions/Treatment      There were no vitals filed for this visit.  Medications Reviewed Today     Reviewed by Morgan Clayborne CROME, RN (Registered Nurse) on 12/24/23 at 1037  Med List Status: <None>   Medication Order Taking? Sig Documenting Provider Last Dose Status Informant  Accu-Chek Softclix Lancets lancets [id number]  Use to test blood sugar daily [name], NP  Active Self, Pharmacy Records  amiodarone (PACERONE) 200 MG tablet [id number] Yes Take 2 tablets (400 mg total) by mouth 2 (two) times daily for 3 days, THEN 1 tablet (200 mg total) daily. Arlice Reichert, MD  Active   apixaban (ELIQUIS) 5 MG TABS tablet [id number] Yes Take 1 tablet (5 mg total) by mouth 2 (two) times daily. Arlice Reichert, MD  Active   atorvastatin  (LIPITOR) 40 MG tablet [id number] Yes TAKE 1 TABLET EVERY DAY [name], Jessica K, NP  Active Self, Pharmacy Records  brimonidine (ALPHAGAN) 0.2 % ophthalmic solution [id number] Yes Place 1 drop into the left eye 2 (two) times daily.   Active Self, Pharmacy Records  calcium  carbonate (TUMS - DOSED IN MG ELEMENTAL CALCIUM ) 500 MG chewable tablet [id number]  Chew 2 tablets by mouth as needed for indigestion or heartburn.  Patient not taking: Reported on 12/24/2023   [provider]  Active Self, Pharmacy Records  glucose blood (ACCU-CHEK AVIVA PLUS) test strip [id number]  Use to  test blood sugar daily. [name], NP  Active Self, Pharmacy Records  isosorbide  mononitrate (IMDUR ) 30 MG 24 hr tablet [id number] Yes Take 1 tablet (30 mg total) by mouth daily. [name], NP  Active Self, Pharmacy Records  metoprolol  succinate (TOPROL -XL) 100 MG 24 hr tablet [id number] Yes Take 1 tablet (100 mg total) by mouth daily. Take with or immediately following a meal. Dahal, Reichert, MD  Active   Multiple Vitamin (MULTIVITAMIN ADULT PO) [id number]  Take 2 each by mouth daily as needed (immune system).  Patient not taking: Reported on 12/24/2023   [provider]  Active Self, Pharmacy Records  potassium chloride  SA (KLOR-CON  M) 20 MEQ tablet [id number] Yes TAKE 1 TABLET TWICE DAILY Caro Jessica K, NP  Active Self, Pharmacy Records           Med Note (SATTERFIELD, TEENA BRAVO   Tue Aug 03, 2023  5:39 PM)    Senna 8.7 MG CHEW [id number] Yes Chew 1 tablet by mouth as needed. [name], NP  Active Self, Pharmacy Records  tirzepatide  (MOUNJARO ) 15 MG/0.5ML Pen [id number] Yes Inject 15 mg into the skin once a week. Santo Stanly LABOR, MD  Active Self, Pharmacy Records           Med Note (SATTERFIELD, TEENA BRAVO   Tue Aug 03, 2023  5:36 PM) Take on sundays  torsemide (DEMADEX) 20 MG tablet [id number] Yes Take 1 tablet (20 mg) by mouth in the morning and 1 tablet (  20 mg) in the early afternoon. Campbell, Kenzie E, NP  Active Self, Pharmacy Records            Recommendation:   PCP Follow-up  12/27/2023 Status: Sch   Time: 1:00 PM Length: 20  Visit Type: HOSPITAL FOLLOW UP [8005] Copay: $0.00  Provider: Phyllis Jereld BROCKS, NP Department: [facility]   Specialty provider follow-up  12/27/2023 Status: Sch   Time: 10:15 AM Length: 45  Visit Type: HEART VASCULAR TOC NEW [1576] Copay: $15.00  Provider: [facility] Department: [facility]    Follow Up Plan:   Continue to work with the VBCI TOC nurse as directed    [name] OBIE BETHANN CCM [place]  [facility] Health Nurse Care Coordinator  Direct Dial: [phone] Website: [email or web address]

## 2023-12-24 NOTE — Patient Instructions (Addendum)
 Visit Information  Thank you for taking time to visit with me today. Please don't hesitate to contact me if I can be of assistance to you before our next scheduled appointment.  Continue to work with the VBCI [facility] nurse as directed   Please call 1-800-273-TALK (toll free, 24 hour hotline) if you are experiencing a Mental Health or Behavioral Health Crisis or need someone to talk to.  Clayborne Ly RN BSN CCM [place]  [facility], [facility] Health Nurse Care Coordinator  Direct Dial: [phone] Website: [email or web address]

## 2023-12-27 ENCOUNTER — Ambulatory Visit (HOSPITAL_COMMUNITY): Payer: Self-pay | Admitting: Cardiology

## 2023-12-27 ENCOUNTER — Other Ambulatory Visit (HOSPITAL_COMMUNITY): Payer: Self-pay

## 2023-12-27 ENCOUNTER — Encounter: Payer: Self-pay | Admitting: Adult Health

## 2023-12-27 ENCOUNTER — Ambulatory Visit (INDEPENDENT_AMBULATORY_CARE_PROVIDER_SITE_OTHER): Admitting: Adult Health

## 2023-12-27 ENCOUNTER — Inpatient Hospital Stay (HOSPITAL_BASED_OUTPATIENT_CLINIC_OR_DEPARTMENT_OTHER)
Admit: 2023-12-27 | Discharge: 2023-12-27 | Disposition: A | Discharge status: Home / Self Care | Attending: Cardiology | Admitting: Cardiology

## 2023-12-27 ENCOUNTER — Encounter (HOSPITAL_COMMUNITY): Payer: Self-pay

## 2023-12-27 VITALS — BP 126/68 | HR 58 | Ht 69.0 in | Wt 297.0 lb

## 2023-12-27 VITALS — BP 138/78 | HR 61 | Temp 97.3°F | Ht 69.0 in | Wt 293.4 lb

## 2023-12-27 DIAGNOSIS — D631 Anemia in chronic kidney disease: Secondary | ICD-10-CM

## 2023-12-27 DIAGNOSIS — I2510 Atherosclerotic heart disease of native coronary artery without angina pectoris: Secondary | ICD-10-CM

## 2023-12-27 DIAGNOSIS — G4733 Obstructive sleep apnea (adult) (pediatric): Secondary | ICD-10-CM | POA: Diagnosis not present

## 2023-12-27 DIAGNOSIS — I4892 Unspecified atrial flutter: Secondary | ICD-10-CM | POA: Diagnosis not present

## 2023-12-27 DIAGNOSIS — N1832 Chronic kidney disease, stage 3b: Secondary | ICD-10-CM

## 2023-12-27 DIAGNOSIS — I480 Paroxysmal atrial fibrillation: Secondary | ICD-10-CM

## 2023-12-27 DIAGNOSIS — I5023 Acute on chronic systolic (congestive) heart failure: Secondary | ICD-10-CM | POA: Diagnosis not present

## 2023-12-27 DIAGNOSIS — Z8546 Personal history of malignant neoplasm of prostate: Secondary | ICD-10-CM | POA: Diagnosis not present

## 2023-12-27 DIAGNOSIS — I4891 Unspecified atrial fibrillation: Secondary | ICD-10-CM

## 2023-12-27 DIAGNOSIS — I129 Hypertensive chronic kidney disease with stage 1 through stage 4 chronic kidney disease, or unspecified chronic kidney disease: Secondary | ICD-10-CM | POA: Diagnosis not present

## 2023-12-27 DIAGNOSIS — I2722 Pulmonary hypertension due to left heart disease: Secondary | ICD-10-CM | POA: Diagnosis not present

## 2023-12-27 DIAGNOSIS — J9611 Chronic respiratory failure with hypoxia: Secondary | ICD-10-CM | POA: Diagnosis not present

## 2023-12-27 DIAGNOSIS — I10 Essential (primary) hypertension: Secondary | ICD-10-CM | POA: Diagnosis not present

## 2023-12-27 DIAGNOSIS — Z86711 Personal history of pulmonary embolism: Secondary | ICD-10-CM | POA: Diagnosis not present

## 2023-12-27 DIAGNOSIS — D509 Iron deficiency anemia, unspecified: Secondary | ICD-10-CM

## 2023-12-27 DIAGNOSIS — J449 Chronic obstructive pulmonary disease, unspecified: Secondary | ICD-10-CM | POA: Diagnosis not present

## 2023-12-27 DIAGNOSIS — N179 Acute kidney failure, unspecified: Secondary | ICD-10-CM | POA: Diagnosis not present

## 2023-12-27 DIAGNOSIS — I5043 Acute on chronic combined systolic (congestive) and diastolic (congestive) heart failure: Secondary | ICD-10-CM | POA: Diagnosis not present

## 2023-12-27 DIAGNOSIS — I130 Hypertensive heart and chronic kidney disease with heart failure and stage 1 through stage 4 chronic kidney disease, or unspecified chronic kidney disease: Secondary | ICD-10-CM | POA: Diagnosis not present

## 2023-12-27 DIAGNOSIS — E1122 Type 2 diabetes mellitus with diabetic chronic kidney disease: Secondary | ICD-10-CM | POA: Diagnosis not present

## 2023-12-27 DIAGNOSIS — I272 Pulmonary hypertension, unspecified: Secondary | ICD-10-CM | POA: Diagnosis not present

## 2023-12-27 DIAGNOSIS — N183 Chronic kidney disease, stage 3 unspecified: Secondary | ICD-10-CM

## 2023-12-27 DIAGNOSIS — Z7901 Long term (current) use of anticoagulants: Secondary | ICD-10-CM | POA: Diagnosis not present

## 2023-12-27 DIAGNOSIS — Z86718 Personal history of other venous thrombosis and embolism: Secondary | ICD-10-CM

## 2023-12-27 DIAGNOSIS — I5020 Unspecified systolic (congestive) heart failure: Secondary | ICD-10-CM | POA: Diagnosis not present

## 2023-12-27 DIAGNOSIS — E662 Morbid (severe) obesity with alveolar hypoventilation: Secondary | ICD-10-CM | POA: Diagnosis not present

## 2023-12-27 DIAGNOSIS — I50810 Right heart failure, unspecified: Secondary | ICD-10-CM

## 2023-12-27 LAB — IRON AND TIBC
Iron: 52 ug/dL (ref 45–182)
Saturation Ratios: 14 % — ABNORMAL LOW (ref 17.9–39.5)
TIBC: 371 ug/dL (ref 250–450)
UIBC: 319 ug/dL

## 2023-12-27 LAB — COMPREHENSIVE METABOLIC PANEL WITH GFR
ALT: 51 U/L — ABNORMAL HIGH (ref 0–44)
AST: 39 U/L (ref 15–41)
Albumin: 3.5 g/dL (ref 3.5–5.0)
Alkaline Phosphatase: 82 U/L (ref 38–126)
Anion gap: 15 (ref 5–15)
BUN: 35 mg/dL — ABNORMAL HIGH (ref 8–23)
CO2: 32 mmol/L (ref 22–32)
Calcium: 8.8 mg/dL — ABNORMAL LOW (ref 8.9–10.3)
Chloride: 97 mmol/L — ABNORMAL LOW (ref 98–111)
Creatinine, Ser: 1.93 mg/dL — ABNORMAL HIGH (ref 0.61–1.24)
GFR, Estimated: 36 mL/min — ABNORMAL LOW (ref >60)
Glucose, Bld: 113 mg/dL — ABNORMAL HIGH (ref 70–99)
Potassium: 3.3 mmol/L — ABNORMAL LOW (ref 3.5–5.1)
Sodium: 144 mmol/L (ref 135–145)
Total Bilirubin: 1.7 mg/dL — ABNORMAL HIGH (ref 0.0–1.2)
Total Protein: 7.3 g/dL (ref 6.5–8.1)

## 2023-12-27 LAB — TSH: TSH: 4.034 u[IU]/mL (ref 0.350–4.500)

## 2023-12-27 LAB — BRAIN NATRIURETIC PEPTIDE: B Natriuretic Peptide: 610.6 pg/mL — ABNORMAL HIGH (ref 0.0–100.0)

## 2023-12-27 MED ORDER — METOPROLOL SUCCINATE ER 50 MG PO TB24
50.0000 mg | ORAL_TABLET | Freq: Every day | ORAL | 3 refills | Status: AC
  Filled 2023-12-27 – 2024-01-04 (×2): qty 90, 90d supply, fill #0
  Filled 2024-03-21: qty 90, 90d supply, fill #1

## 2023-12-27 NOTE — Telephone Encounter (Signed)
 Pt's daughter calling to f/u on whether paperwork has been faxed to [employer] yet. Pt also reminding that [employer] has to received paperwork no later that 01/01/24. Daughter would also like to be called once paperwork has been sent. Please advise

## 2023-12-27 NOTE — Addendum Note (Signed)
 Encounter addended by: Sonita Michiels, NP on: 12/27/2023 10:39 AM  Actions taken: Visit diagnoses modified, Clinical Note Signed

## 2023-12-27 NOTE — Progress Notes (Addendum)
 HEART & VASCULAR TRANSITION OF CARE CONSULT NOTE   Referring Physician: Dr. Lonni PCP: [name], NP  Cardiologist: Stanly DELENA Leavens, MD  HPI: Referred to clinic by Dr. Lonni for heart failure consultation.   Thomas [name] is a 73 y.o. male with history of HFmrEF, pHTN, paroxymal atrial fibrillation, DVT/PE in 2018, HTN, T2DN, nonobstructive CAD, OSA, CKD, prostate cancer, COPD on chronic O2, and urinary rentention s/p suprapubic catheter.  Cath in 2019 showed nonobstructive CAD with moderate to severe pHTN.   Admitted in May 2025 with Afib RVR. EF was 40-45% with mod reduced RV. It was decided by Cardiology to continue rate control and repeat DCCV after 3 weeks of anticoagulation (noncompliant with Xarelto ). He underwent DCCV, unfortunately was readmitted a week later with ADHF in the setting of ERAF. He was not felt to be a ablation candidate due to chronic O2 use. Amiodarone was recommended, but he refused due to risk of lung toxicity in the setting of COPD. Underwent repeat DCCV 08/16/23. Seen at follow up 7/25 and Lasix  increased.   Most recently was seen 11/21/23 for cardiology OPV. Unfortunately again readmitted 12/13/23 with ADHF. He was aggressively diuresed, however complicated by AKI on CKD. His Xarelto  was switched to Eliquis due to renal function. Toprol  decreased for bradycardia and amiodarone was added.  Echo showed EF 40-45% with mod LVH, G1DD, RVSP 57.   Today he presents for transition of care visit on home O2. Overall feeling okay. NYHA IIIb, limited by lung disease. Reports dyspnea and fatigue. Denies chest pain, lower extremity edema, palpitations, dizziness, and abnormal bleeding. Able to perform ADLs. Appetite okay. Weight at home stable, does not take every day. Compliant with all medications. Denies ETOH, tobacco, or drug use. Compliant with CPAP.   Past Medical History:  Diagnosis Date   Acute on chronic diastolic CHF (congestive  heart failure) (HCC) 06/01/2017   Arthritis    Cancer (HCC) 2010   Prostate   Chronic kidney disease    Diabetes mellitus    Diabetic neuropathy (HCC)    feet   DVT (deep venous thrombosis) (HCC)    Genetic testing 06/22/2016   Mr. Wingert underwent genetic counseling and testing for hereditary cancer syndromes on 05/14/2016. His results were negative for mutations in all 46 genes analyzed by [facility]'s 46-gene Common Hereditary Cancers Panel. Genes analyzed include: APC, ATM, AXIN2, BARD1, BMPR1A, BRCA1, BRCA2, BRIP1, CDH1, CDKN2A, CHEK2, CTNNA1, DICER1, EPCAM, GREM1, HOXB13, KIT, MEN1, MLH1, MSH2, MSH3, MSH6, MUTYH, NB   GERD (gastroesophageal reflux disease)    Hypertension    PE (pulmonary thromboembolism) (HCC)    Pneumonia    Sleep apnea    not wearing CPAP   Suprapubic catheter (HCC) 11/17/2019    Current Outpatient Medications  Medication Sig Dispense Refill   Accu-Chek Softclix Lancets lancets Use to test blood sugar daily 200 each 3   amiodarone (PACERONE) 200 MG tablet Take 2 tablets (400 mg total) by mouth 2 (two) times daily for 3 days, THEN 1 tablet (200 mg total) daily. 42 tablet 0   apixaban (ELIQUIS) 5 MG TABS tablet Take 1 tablet (5 mg total) by mouth 2 (two) times daily. 60 tablet 0   atorvastatin  (LIPITOR) 40 MG tablet TAKE 1 TABLET EVERY DAY 90 tablet 1   brimonidine (ALPHAGAN) 0.2 % ophthalmic solution Place 1 drop into the left eye 2 (two) times daily. 5 mL 4   glucose blood (ACCU-CHEK AVIVA PLUS) test strip Use to test  blood sugar daily. 100 each 3   isosorbide  mononitrate (IMDUR ) 30 MG 24 hr tablet Take 1 tablet (30 mg total) by mouth daily. 90 tablet 3   Multiple Vitamin (MULTIVITAMIN ADULT PO) Take 2 each by mouth daily as needed (immune system).     OXYGEN  Inhale 3 L into the lungs continuous.     potassium chloride  SA (KLOR-CON  M) 20 MEQ tablet TAKE 1 TABLET TWICE DAILY 180 tablet 3   Senna 8.7 MG CHEW Chew 1 tablet by mouth as needed. 90 tablet 1    tirzepatide  (MOUNJARO ) 15 MG/0.5ML Pen Inject 15 mg into the skin once a week. 2 mL 5   torsemide (DEMADEX) 20 MG tablet Take 1 tablet (20 mg) by mouth in the morning and 1 tablet (20 mg) in the early afternoon. 180 tablet 3   calcium  carbonate (TUMS - DOSED IN MG ELEMENTAL CALCIUM ) 500 MG chewable tablet Chew 2 tablets by mouth as needed for indigestion or heartburn. (Patient not taking: Reported on 12/27/2023)     metoprolol  succinate (TOPROL -XL) 50 MG 24 hr tablet Take 1 tablet (50 mg total) by mouth daily. Take with or immediately following a meal. 90 tablet 3   No current facility-administered medications for this encounter.    Allergies  Allergen Reactions   Lisinopril  Cough    Muscle pain      Social History   Socioeconomic History   Marital status: Married    Spouse name: Heron   Number of children: 5   Years of education: Not on file   Highest education level: 12th grade  Occupational History   Occupation: Retired  Tobacco Use   Smoking status: Never    Passive exposure: Never   Smokeless tobacco: Never   Tobacco comments:    Never smoked 09/03/23  Vaping Use   Vaping status: Never Used  Substance and Sexual Activity   Alcohol  use: No    Comment: never   Drug use: No   Sexual activity: Yes  Other Topics Concern   Not on file  Social History Narrative   Not on file   Social Drivers of Health   Financial Resource Strain: Low Risk  (12/16/2023)   Overall Financial Resource Strain (CARDIA)    Difficulty of Paying Living Expenses: Not hard at all  Food Insecurity: No Food Insecurity (12/24/2023)   Hunger Vital Sign    Worried About Running Out of Food in the Last Year: Never true    Ran Out of Food in the Last Year: Never true  Transportation Needs: No Transportation Needs (12/24/2023)   PRAPARE - Administrator, Civil Service (Medical): No    Lack of Transportation (Non-Medical): No  Physical Activity: Inactive (12/09/2017)   Exercise Vital  Sign    Days of Exercise per Week: 0 days    Minutes of Exercise per Session: 0 min  Stress: No Stress Concern Present (12/09/2017)   [employer] of Occupational Health - Occupational Stress Questionnaire    Feeling of Stress : Only a little  Social Connections: Unknown (12/14/2023)   Social Connection and Isolation Panel    Frequency of Communication with Friends and Family: More than three times a week    Frequency of Social Gatherings with Friends and Family: More than three times a week    Attends Religious Services: Not on file    Active Member of Clubs or Organizations: No    Attends Banker Meetings: Never    Marital Status:  Married  Recent Concern: Social Connections - Moderately Isolated (12/14/2023)   Social Connection and Isolation Panel    Frequency of Communication with Friends and Family: More than three times a week    Frequency of Social Gatherings with Friends and Family: More than three times a week    Attends Religious Services: Never    Database Administrator or Organizations: Not on file    Attends Banker Meetings: Never    Marital Status: Married  Catering Manager Violence: Not At Risk (12/24/2023)   Humiliation, Afraid, Rape, and Kick questionnaire    Fear of Current or Ex-Partner: No    Emotionally Abused: No    Physically Abused: No    Sexually Abused: No      Family History  Problem Relation Age of Onset   Breast cancer Mother 46       d.89   Breast cancer Sister 30       d.30   Leukemia Brother 18       d.20   Breast cancer Maternal Aunt 40       d.40s   Lung cancer Maternal Uncle    Prostate cancer Paternal Uncle    Prostate cancer Brother        recurred recently at age 53   Other Brother 15       spinal tumor   Cervical cancer Other 22       d.22   Cancer Sister 48       unspecified type   Cancer Maternal Uncle 80       unspecified type   Vitals:   12/27/23 1004  BP: 126/68  Pulse: (!) 58  SpO2:  98%  Weight: 134.7 kg (297 lb)  Height: 5' 9 (1.753 m)   PHYSICAL EXAM: General: Obese appearing. No distress on [place] Cardiac: JVP flat. S1 and S2 present. No murmurs Resp: Lung sounds clear and equal B/L Extremities: Warm and dry.  No peripheral edema.  Neuro: Alert and oriented x3. Affect flat. Walking with rolator  ECG (personally reviewed): NSR 61 bpm  ASSESSMENT & PLAN:  Chronic HFmrEF  - Most recent echo with EF 40-45%, G1DD - Suspect HFpEF with mildly reduced EF due to tachymediated CM.  - NYHA IIIb, mainly limited by lung disease. Euvolemic on exam - continue torsemide 20 mg bid + KCL 20. BMET/BNP today - GDMT: ? blocker: decrease toprol  XL to 50 daily with bradycardia; may favor bisoprolol in the future with lung disease. ARB/ARNI: limited by renal function MRA: limited by renal function SGLT2i:would not start with suprapubic catheter Vasodilators: continue imdur  30 mg daily  Paroxysmal Atrial Fibrillation  - in NSR on ECG today - decrease toprol  xl to 50 mg daily - continue amio 200 mg daily - continue eliquis 5 mg daily, has not missed doses  Chronic Hypoxic Respiratory Failure COPD OSA pHTN RV Failure - WHO group 3. Remains on chronic O2 up to 4L - mod reduced RV by echo 5/25, RVSP 57 on echo 10/25 - would favor weight loss; continue mounjaro  - compliant with CPAP - diuretic plan as above  AKI on CKD 3b - baseline Cr 1.5-1.7 - last Cr 2.86 - BMET today  HTN  - GDMT as above  Obesity - Body mass index is 43.86 kg/m. - on mounjaro   Fe def Anemia - denies abnormal bleeding - check tibc; if tsat <20, refer for IV Fe  Referred to HFSW (PCP, Medications, Transportation, ETOH Abuse, Drug Abuse,  Insurance, Surveyor, Quantity ): No Refer to Pharmacy: No Refer to Home Health: No Refer to Advanced Heart Failure Clinic: No Refer to General Cardiology: No  Follow up with with [facility] (already established). Instructed to make follow up appointment with  Pulmonologist as well.   Lakeshia Dohner, NP 12/27/23

## 2023-12-27 NOTE — Progress Notes (Signed)
 Thomas [name]  Provider:  Jereld Serum DNP  Code Status:  Full Code  Goals of Care:     12/14/2023    5:44 AM  Advanced Directives  Would patient like information on creating a medical advance directive? No - Patient declined     Chief Complaint  Patient presents with   Hospitalization Follow-up    HFU 10/27 - 11/5 AFIB    Discussed the use of AI scribe software for clinical note transcription with the patient, who gave verbal consent to proceed.  HPI: Patient is a 73 y.o. male seen today for a follow up of hospitalization. He was accompanied by his wife.  He was hospitalized from October 27 to December 22, 2023, due to an acute exacerbation of chronic systolic congestive heart failure. Symptoms included shortness of breath, dyspnea on exertion, progressive bilateral lower extremity edema, and orthopnea, ongoing for two weeks. He was unable to use his CPAP at home due to fluid overload, which made breathing difficult when lying down. During hospitalization, he was treated for these symptoms. Currently, he experiences shortness of breath on exertion and uses oxygen  at 3 liters continuously and 4 liters during exertion. He has resumed using his CPAP for obstructive sleep apnea.  He has a history of atrial fibrillation and was switched from Xarelto  to Eliquis 5 mg twice daily. He is also on amiodarone 200 mg daily after an initial higher dose for three days. He takes metoprolol  succinate 100 mg daily and Imdur  30 mg daily. Hydralazine , losartan , Aldactone , and Jardiance  were discontinued due to kidney concerns.  He has coronary artery disease with a history of a left heart catheterization in 2019 showing nonobstructive disease. He continues atorvastatin  40 mg daily.  For type 2 diabetes mellitus, his last A1c was 5.7% in July 2025. He was previously on Mounjaro  and metformin , but metformin  was discontinued due to elevated creatinine. He continues Mounjaro  15 mg weekly.  He has a  history of deep vein thrombosis in the left leg and a pulmonary embolism, which was discovered during a visit with a pulmonologist.  He has a history of prostate cancer and uses a suprapubic catheter, which is changed monthly by his urologist. He is prone to urinary tract infections due to the catheter.  His weight has fluctuated, with a recent weight of 293 pounds, down from 297 pounds earlier in the day. He uses a wheelchair scale at home, which showed a weight of 278 pounds. He monitors his weight daily due to congestive heart failure.  He wakes up at 4:00 AM and goes to bed around 8:00 PM.     Past Medical History:  Diagnosis Date   Acute on chronic diastolic CHF (congestive heart failure) (HCC) 06/01/2017   Arthritis    Cancer (HCC) 2010   Prostate   Chronic kidney disease    Diabetes mellitus    Diabetic neuropathy (HCC)    feet   DVT (deep venous thrombosis) (HCC)    Genetic testing 06/22/2016   Mr. Zeimet underwent genetic counseling and testing for hereditary cancer syndromes on 05/14/2016. His results were negative for mutations in all 46 genes analyzed by [facility]'s 46-gene Common Hereditary Cancers Panel. Genes analyzed include: APC, ATM, AXIN2, BARD1, BMPR1A, BRCA1, BRCA2, BRIP1, CDH1, CDKN2A, CHEK2, CTNNA1, DICER1, EPCAM, GREM1, HOXB13, KIT, MEN1, MLH1, MSH2, MSH3, MSH6, MUTYH, NB   GERD (gastroesophageal reflux disease)    Hypertension    PE (pulmonary thromboembolism) (HCC)    Pneumonia    Sleep apnea  not wearing CPAP   Suprapubic catheter (HCC) 11/17/2019    Past Surgical History:  Procedure Laterality Date   CARDIOVERSION N/A 06/03/2017   Procedure: CARDIOVERSION;  Surgeon: Jeffrie Oneil BROCKS, MD;  Location: [facility] ENDOSCOPY;  Service: Cardiovascular;  Laterality: N/A;   CARDIOVERSION N/A 07/27/2023   Procedure: CARDIOVERSION;  Surgeon: Delford Maude BROCKS, MD;  Location: [facility] INVASIVE CV LAB;  Service: Cardiovascular;  Laterality: N/A;   CARDIOVERSION N/A 08/16/2023    Procedure: CARDIOVERSION;  Surgeon: Santo Stanly LABOR, MD;  Location: [facility] INVASIVE CV LAB;  Service: Cardiovascular;  Laterality: N/A;   CATARACT EXTRACTION Left 07/31/2021   Dr.Glenn, [facility]   COLONOSCOPY     COLONOSCOPY WITH PROPOFOL  N/A 10/20/2018   Procedure: COLONOSCOPY WITH PROPOFOL ;  Surgeon: Eda Iha, MD;  Location: [facility] ENDOSCOPY;  Service: Gastroenterology;  Laterality: N/A;   HERNIA REPAIR     KNEE SURGERY     MULTIPLE TOOTH EXTRACTIONS     POLYPECTOMY  10/20/2018   Procedure: POLYPECTOMY;  Surgeon: Eda Iha, MD;  Location: [facility] ENDOSCOPY;  Service: Gastroenterology;;   PROSTATE SURGERY     RADIOACTIVE SEED IMPLANT     RIGHT/LEFT HEART CATH AND CORONARY ANGIOGRAPHY N/A 07/06/2017   Procedure: RIGHT/LEFT HEART CATH AND CORONARY ANGIOGRAPHY;  Surgeon: Dann Candyce RAMAN, MD;  Location: [facility] INVASIVE CV LAB;  Service: Cardiovascular;  Laterality: N/A;   SHOULDER SURGERY     TOTAL KNEE ARTHROPLASTY Right 11/19/2016   Procedure: RIGHT TOTAL KNEE ARTHROPLASTY;  Surgeon: Jerri Kay HERO, MD;  Location: [facility] OR;  Service: Orthopedics;  Laterality: Right;   uretha surgery-2014      Allergies  Allergen Reactions   Lisinopril  Cough    Muscle pain    Outpatient Encounter Medications as of 12/27/2023  Medication Sig   Accu-Chek Softclix Lancets lancets Use to test blood sugar daily   amiodarone (PACERONE) 200 MG tablet Take 2 tablets (400 mg total) by mouth 2 (two) times daily for 3 days, THEN 1 tablet (200 mg total) daily.   apixaban (ELIQUIS) 5 MG TABS tablet Take 1 tablet (5 mg total) by mouth 2 (two) times daily.   atorvastatin  (LIPITOR) 40 MG tablet TAKE 1 TABLET EVERY DAY   brimonidine (ALPHAGAN) 0.2 % ophthalmic solution Place 1 drop into the left eye 2 (two) times daily.   calcium  carbonate (TUMS - DOSED IN MG ELEMENTAL CALCIUM ) 500 MG chewable tablet Chew 2 tablets by mouth as needed for indigestion or heartburn. (Patient not taking: Reported on  12/31/2023)   glucose blood (ACCU-CHEK AVIVA PLUS) test strip Use to test blood sugar daily.   isosorbide  mononitrate (IMDUR ) 30 MG 24 hr tablet Take 1 tablet (30 mg total) by mouth daily.   metoprolol  succinate (TOPROL -XL) 50 MG 24 hr tablet Take 1 tablet (50 mg total) by mouth daily. Take with or immediately following a meal.   Multiple Vitamin (MULTIVITAMIN ADULT PO) Take 2 each by mouth daily as needed (immune system). (Patient not taking: Reported on 12/31/2023)   OXYGEN  Inhale 3 L into the lungs continuous.   potassium chloride  SA (KLOR-CON  M) 20 MEQ tablet TAKE 1 TABLET TWICE DAILY   Senna 8.7 MG CHEW Chew 1 tablet by mouth as needed.   tirzepatide  (MOUNJARO ) 15 MG/0.5ML Pen Inject 15 mg into the skin once a week.   torsemide (DEMADEX) 20 MG tablet Take 1 tablet (20 mg) by mouth in the morning and 1 tablet (20 mg) in the early afternoon.   No facility-administered encounter medications on  file as of 12/27/2023.    Review of Systems:  Review of Systems  Constitutional:  Negative for activity change, appetite change and fever.  HENT:  Negative for sore throat.   Eyes: Negative.   Respiratory:  Positive for shortness of breath.   Cardiovascular:  Negative for chest pain and leg swelling.  Gastrointestinal:  Negative for abdominal distention, diarrhea and vomiting.  Genitourinary:  Negative for urgency.  Skin:  Negative for color change.  Neurological:  Negative for dizziness and headaches.  Psychiatric/Behavioral:  Negative for behavioral problems and sleep disturbance. The patient is not nervous/anxious.     Health Maintenance  Topic Date Due   DTaP/Tdap/Td (1 - Tdap) 01/03/2024 (Originally 06/26/1969)   COVID-19 Vaccine (5 - 2025-26 season) 02/01/2024 (Originally 10/18/2023)   Zoster Vaccines- Shingrix (1 of 2) 01/31/2029 (Originally 06/26/1969)   HEMOGLOBIN A1C  02/19/2024   FOOT EXAM  05/23/2024   Diabetic kidney evaluation - Urine ACR  08/01/2024   Medicare Annual Wellness  (AWV)  11/15/2024   OPHTHALMOLOGY EXAM  12/06/2024   Diabetic kidney evaluation - eGFR measurement  12/26/2024   Colonoscopy  10/19/2028   Pneumococcal Vaccine: 50+ Years  Completed   Influenza Vaccine  Completed   Hepatitis C Screening  Completed   Meningococcal B Vaccine  Aged Out    Physical Exam: Vitals:   12/27/23 1252  BP: 138/78  Pulse: 61  Temp: (!) 97.3 F (36.3 C)  TempSrc: Temporal  SpO2: 96%  Weight: 293 lb 6.4 oz (133.1 kg)  Height: 5' 9 (1.753 m)   Body mass index is 43.33 kg/m. Physical Exam Constitutional:      General: He is not in acute distress.    Appearance: He is obese.  HENT:     Head: Normocephalic and atraumatic.     Mouth/Throat:     Mouth: Mucous membranes are moist.  Eyes:     Conjunctiva/sclera: Conjunctivae normal.  Cardiovascular:     Rate and Rhythm: Normal rate and regular rhythm.     Pulses: Normal pulses.     Heart sounds: Normal heart sounds.  Pulmonary:     Effort: Pulmonary effort is normal.     Breath sounds: Normal breath sounds.  Abdominal:     General: Bowel sounds are normal.     Palpations: Abdomen is soft.  Genitourinary:    Comments: Suprapubic catheter Musculoskeletal:        General: No swelling. Normal range of motion.     Cervical back: Normal range of motion.  Skin:    General: Skin is warm and dry.  Neurological:     General: No focal deficit present.     Mental Status: He is alert and oriented to person, place, and time.  Psychiatric:        Mood and Affect: Mood normal.        Behavior: Behavior normal.        Thought Content: Thought content normal.        Judgment: Judgment normal.     Labs reviewed: Basic Metabolic Panel: Recent Labs    07/03/23 1730 07/04/23 0455 07/05/23 0546 07/06/23 0955 07/15/23 1206 08/02/23 1109 08/06/23 1118 08/07/23 1055 12/21/23 0922 12/22/23 0350 12/27/23 1045  NA  --    < > 139   < > 145   < >  --    < > 142 141 144  K  --    < > 4.1   < > 4.5   < >  --     < >  4.3 3.9 3.3*  CL  --    < > 109   < > 109   < >  --    < > 104 101 97*  CO2  --    < > 23   < > 26   < >  --    < > 24 27 32  GLUCOSE  --    < > 130*   < > 111   < >  --    < > 125* 141* 113*  BUN  --    < > 26*   < > 27*   < >  --    < > 52* 50* 35*  CREATININE  --    < > 1.65*   < > 1.77*   < >  --    < > 2.76* 2.86* 1.93*  CALCIUM   --    < > 8.3*   < > 8.8   < >  --    < > 8.9 8.9 8.8*  MG  --   --  2.2  --  2.0  --  2.2  --   --   --   --   TSH 1.058  --   --   --   --   --   --   --   --   --  4.034   < > = values in this interval not displayed.   Liver Function Tests: Recent Labs    08/02/23 1109 08/03/23 1238 12/27/23 1045  AST 15 25 39  ALT 34 38 51*  ALKPHOS  --  56 82  BILITOT 1.0 1.7* 1.7*  PROT 6.8 7.3 7.3  ALBUMIN  --  3.7 3.5   No results for input(s): LIPASE, AMYLASE in the last 8760 hours. No results for input(s): AMMONIA in the last 8760 hours. CBC: Recent Labs    12/15/23 0243 12/16/23 0244 12/17/23 0250  WBC 6.8 6.9 6.8  NEUTROABS 4.4 4.9 4.3  HGB 11.5* 11.6* 11.8*  HCT 35.7* 36.6* 37.0*  MCV 91.5 92.9 90.9  PLT 236 208 238   Lipid Panel: Recent Labs    08/19/23 0934 11/25/23 0936  CHOL 86 89*  HDL 40 40  LDLCALC 33 36  TRIG 46 53  CHOLHDL 2.2 2.2   Lab Results  Component Value Date   HGBA1C 5.7 (H) 08/19/2023    Procedures since last visit: ECHOCARDIOGRAM COMPLETE Result Date: 12/15/2023    ECHOCARDIOGRAM REPORT   Patient Name:   Thomas [name] [facility] Date of Exam: 12/15/2023 Medical Rec #:  [id number]            Height:       69.0 in Accession #:    [id number]           Weight:       304.2 lb Date of Birth:  1950-07-14            BSA:          2.469 m Patient Age:    73 years             BP:           135/103 mmHg Patient Gender: M                    HR:           133 bpm. Exam Location:  Inpatient Procedure: 2D Echo and Intracardiac Opacification Agent (Both Spectral and Color  Flow Doppler were utilized during  procedure). Indications:    Abn EKG  History:        Patient has prior history of Echocardiogram examinations.                 Abnormal ECG.  Sonographer:    Norleen Amour Referring Phys: [id number] BINAYA DAHAL IMPRESSIONS  1. Left ventricular ejection fraction, by estimation, is 40 to 45%. The left ventricle has mildly decreased function. Left ventricular endocardial border not optimally defined to evaluate regional wall motion. There is moderate left ventricular hypertrophy. Left ventricular diastolic parameters are consistent with Grade I diastolic dysfunction (impaired relaxation).  2. Right ventricular systolic function was not well visualized. The right ventricular size is not well visualized. There is moderately elevated pulmonary artery systolic pressure. The estimated right ventricular systolic pressure is 57.0 mmHg.  3. Left atrial size was mildly dilated.  4. The mitral valve is abnormal. Trivial mitral valve regurgitation.  5. The tricuspid valve is abnormal.  6. The aortic valve is tricuspid. Aortic valve regurgitation is not visualized. Aortic valve sclerosis/calcification is present, without any evidence of aortic stenosis.  7. The inferior vena cava is dilated in size with <50% respiratory variability, suggesting right atrial pressure of 15 mmHg. Comparison(s): No significant change from prior study. 07/04/2023: LVEF 40-45%. FINDINGS  Left Ventricle: Left ventricular ejection fraction, by estimation, is 40 to 45%. The left ventricle has mildly decreased function. Left ventricular endocardial border not optimally defined to evaluate regional wall motion. Definity  contrast agent was given IV to delineate the left ventricular endocardial borders. The left ventricular internal cavity size was normal in size. There is moderate left ventricular hypertrophy. Left ventricular diastolic parameters are consistent with Grade I diastolic dysfunction (impaired relaxation). Indeterminate filling pressures. Right  Ventricle: The right ventricular size is not well visualized. Right vetricular wall thickness was not well visualized. Right ventricular systolic function was not well visualized. There is moderately elevated pulmonary artery systolic pressure. The  tricuspid regurgitant velocity is 3.24 m/s, and with an assumed right atrial pressure of 15 mmHg, the estimated right ventricular systolic pressure is 57.0 mmHg. Left Atrium: Left atrial size was mildly dilated. Right Atrium: Right atrial size was normal in size. Pericardium: Trivial pericardial effusion is present. The pericardial effusion is posterior to the left ventricle. Mitral Valve: The mitral valve is abnormal. There is mild calcification of the mitral valve leaflet(s). Trivial mitral valve regurgitation. Tricuspid Valve: The tricuspid valve is abnormal. Tricuspid valve regurgitation is mild. Aortic Valve: The aortic valve is tricuspid. Aortic valve regurgitation is not visualized. Aortic valve sclerosis/calcification is present, without any evidence of aortic stenosis. Pulmonic Valve: The pulmonic valve was grossly normal. Pulmonic valve regurgitation is trivial. Aorta: The aortic root and ascending aorta are structurally normal, with no evidence of dilitation. Venous: The inferior vena cava is dilated in size with less than 50% respiratory variability, suggesting right atrial pressure of 15 mmHg. IAS/Shunts: No atrial level shunt detected by color flow Doppler.  LEFT VENTRICLE PLAX 2D LVIDd:         4.60 cm     Diastology LVIDs:         3.20 cm     LV e' medial:    4.70 cm/s LV PW:         1.50 cm     LV E/e' medial:  13.2 LV IVS:        1.00 cm     LV e' lateral:   5.92  cm/s LVOT diam:     2.20 cm     LV E/e' lateral: 10.5 LV SV:         48 LV SV Index:   19 LVOT Area:     3.80 cm  LV Volumes (MOD) LV vol d, MOD A2C: 84.2 ml LV vol d, MOD A4C: 95.8 ml LV vol s, MOD A2C: 54.1 ml LV vol s, MOD A4C: 31.7 ml LV SV MOD A2C:     30.1 ml LV SV MOD A4C:     95.8 ml  LV SV MOD BP:      47.7 ml RIGHT VENTRICLE             IVC RV S prime:     10.10 cm/s  IVC diam: 2.10 cm                              PULMONARY VEINS                             Diastolic Velocity: 29.20 cm/s                             S/D Velocity:       1.10                             Systolic Velocity:  32.60 cm/s LEFT ATRIUM           Index LA diam:      4.10 cm 1.66 cm/m LA Vol (A4C): 85.5 ml 34.63 ml/m  AORTIC VALVE             PULMONIC VALVE LVOT Vmax:   71.30 cm/s  PV Vmax:       0.52 m/s LVOT Vmean:  48.500 cm/s PV Peak grad:  1.1 mmHg LVOT VTI:    0.126 m  AORTA Ao Root diam: 3.20 cm Ao Asc diam:  3.30 cm MITRAL VALVE               TRICUSPID VALVE MV Area (PHT): 4.65 cm    TR Peak grad:   42.0 mmHg MV Decel Time: 163 msec    TR Vmax:        324.00 cm/s MV E velocity: 62.20 cm/s MV A velocity: 76.40 cm/s  SHUNTS MV E/A ratio:  0.81        Systemic VTI:  0.13 m                            Systemic Diam: 2.20 cm Vinie Maxcy MD Electronically signed by Vinie Maxcy MD Signature Date/Time: 12/15/2023/1:41:31 PM    Final    DG Chest 2 View Result Date: 12/13/2023 CLINICAL DATA:  Worsening shortness of breath for 3-4 days. EXAM: CHEST - 2 VIEW COMPARISON:  Radiographs 08/03/2023 and 08/02/2023. Chest CT 06/11/2022. FINDINGS: There is chronic elevation of the left hemidiaphragm with associated left basilar atelectasis or scarring. Overall left base aeration appears improved compared with the previous examination. The lungs are otherwise clear. There is no pleural effusion or pneumothorax. The heart size and mediastinal contours are stable. Degenerative changes in the spine without evidence of acute osseous abnormality. IMPRESSION: Chronic elevation of the left hemidiaphragm with associated left basilar atelectasis or scarring. No  acute cardiopulmonary process. Electronically Signed   By: Elsie Perone M.D.   On: 12/13/2023 12:50    Assessment/Plan  1. Acute on chronic systolic heart failure (HCC)  (Primary) -  Recent exacerbation managed with diuretics and potassium. Fluid loss indicated by weight decrease. Exertional dyspnea persists. - Continue torsemide 20 mg twice daily. - Continue potassium supplementation with KCL ER 20 mEq twice daily. - Monitor weight daily.  2. Benign hypertension with chronic kidney disease, stage III (HCC) -  Blood pressure well-managed after medication adjustments. - Continue metoprolol  succinate 100 mg daily. - Continue Imdur  30 mg daily.  3. Atrial fibrillation with RVR (HCC) -  Managed with Eliquis and amiodarone. Xarelto  replaced with Eliquis. Amiodarone dose adjusted. - Continue Eliquis 5 mg twice daily. - Continue amiodarone 200 mg daily. - Monitor heart rate and blood pressure.  4. Coronary artery disease involving native coronary artery of native heart without angina pectoris -  Nonobstructive disease managed with atorvastatin . Aspirin  discontinued due to anticoagulation. - Continue atorvastatin  40 mg daily.  5. Chronic respiratory failure with hypoxia (HCC) -  Continue oxygen  therapy at 3 liters continuously, increase to 4 liters on exertion.  6. History of prostate cancer -  Prostate cancer with suprapubic catheter. Prone to UTIs. - Continue monthly suprapubic catheter changes with urology.  7. MORBID OBESITY -  Significant weight loss since starting Mounjaro . Encouraged continued weight management. - Continue Mounjaro  15 mg weekly. - Encouraged small, frequent meals.  8. History of DVT (deep vein thrombosis) -  Managed with anticoagulation. - Continue Eliquis 5 mg twice daily.  9. History of pulmonary embolism -  Managed with anticoagulation. - Continue Eliquis 5 mg twice daily.   Labs/tests ordered:  None   Return if symptoms worsen or fail to improve.  Thomas Keng Medina-Vargas, Thomas [name]

## 2023-12-27 NOTE — Telephone Encounter (Signed)
 Pt son is calling about his FMLA. I think there are two forms that need to be filled out for the daughter and the son. The son states his paperwork is due tomorrow. Please advise if these were ever received and who can fill them out.

## 2023-12-27 NOTE — Patient Instructions (Signed)
 CHANGE Toprol  XL to 50 mg daily.  Labs done today, your results will be available in MyChart, we will contact you for abnormal readings.  Thank you for allowing us  to provider your heart failure care after your recent hospitalization. Please follow-up with your general Cardiologist and Pulmonologist soon.  If you have any questions, issues, or concerns before your next appointment please call our office at [phone], opt. 2 and leave a message for the triage nurse.

## 2023-12-28 ENCOUNTER — Telehealth: Payer: Self-pay | Admitting: Internal Medicine

## 2023-12-28 NOTE — Telephone Encounter (Signed)
-----   Message from Nurse Emer C sent at 12/27/2023 10:29 AM EST ----- Regarding: post hosp follow up Was seen in the heart failure TOC clinic today. Not a Heart Failure Patient. Needs to follow up in about 4 weeks with either a PA or his Cardiologist..  Thanks   Emer

## 2023-12-28 NOTE — Telephone Encounter (Signed)
 Patient contacted 3x to schedule 4 week f/u but have been unsuccessful in reaching patient. Will send letter to patient via MyChart

## 2023-12-29 ENCOUNTER — Telehealth (HOSPITAL_COMMUNITY): Payer: Self-pay | Admitting: Cardiology

## 2023-12-29 ENCOUNTER — Telehealth: Payer: Self-pay

## 2023-12-29 DIAGNOSIS — D509 Iron deficiency anemia, unspecified: Secondary | ICD-10-CM | POA: Insufficient documentation

## 2023-12-29 NOTE — Telephone Encounter (Signed)
 Spoke with Sherrell and shared providers response

## 2023-12-29 NOTE — Telephone Encounter (Signed)
 Patient referred to infusion pharmacy team for ambulatory infusion of IV iron .  Insurance - [employer]  Site of care - [facility]  Dx code - D50.9  IV Iron  Therapy - Venofer 300 mg x 3  Infusion appointments - Scheduling team will schedule patient as soon as possible.

## 2023-12-29 NOTE — Telephone Encounter (Signed)
 Okay to make referral

## 2023-12-29 NOTE — Telephone Encounter (Signed)
 Copied from CRM [id number]. Topic: General - Other >> Dec 29, 2023 12:04 PM Chiquita SQUIBB wrote: Reason for CRM: Randine from [facility] care patient was referred to them for palliative care and they need a verbal order to complete this. A good call back number is [phone]

## 2023-12-30 ENCOUNTER — Telehealth (HOSPITAL_COMMUNITY): Payer: Self-pay

## 2023-12-30 NOTE — Telephone Encounter (Signed)
 Auth Submission: NO AUTH NEEDED Site of care: Site of care: [facility] Payer: [employer] Medication & CPT/J Code(s) submitted: Venofer (Iron  Sucrose) J1756 Diagnosis Code: D50.9 Route of submission (phone, fax, portal):  Phone # Fax # Auth type: Buy/Bill HB Units/visits requested: 300mg  x 3 doses Reference number:  Approval from: 12/30/23 to 02/16/24

## 2023-12-31 ENCOUNTER — Telehealth: Payer: Self-pay | Admitting: Internal Medicine

## 2023-12-31 ENCOUNTER — Telehealth: Payer: Self-pay

## 2023-12-31 NOTE — Patient Instructions (Signed)
 Visit Information  Thank you for taking time to visit with me today. Please don't hesitate to contact me if I can be of assistance to you before our next scheduled telephone appointment.  Our next appointment is by telephone on 01/07/24 in the morning  Following is a copy of your care plan:   Goals Addressed             This Visit's Progress    VBCI Transitions of Care (TOC) Care Plan       Problems:  Recent Hospitalization for treatment of Admit/Discharge Date   10/27- 11/5  Thomas [name]    Primary Diagnosis: Acute on chronic heart failure    Goal:  Over the next 30 days, the patient will not experience hospital readmission  Interventions:  Transitions of Care: Doctor Visits  - discussed the importance of doctor visits Reviewed the following discharge instructions with patient and wife: Discharge instructions for CHF Check weight daily -preferably same time every day. Restrict fluid intake to 1200 ml daily Restrict salt intake to less than 2 g daily. Call MD if you have one of the following symptoms 1) 3 pound weight gain in 24 hours or 5 pounds in 1 week  2) swelling in the hands, feet or stomach  3) progressive shortness of breath 4) if you must sleep on extra pillows at night in order to breathe [facility] RN asked about [employer] which was documented and patient/wife state patient does not have them and denied need stating no wounds and swelling has improved Reviewed hospital follow up appts - PCP and Heart & vascular are both 12/27/23 Reviewed oxygen  and patient confirms 3 liters O2 and reports O2 sat is 95-97% - Wife reports weight is 285lbs BP 125/81 HR 52 and states HR is often low - noted HR of 51 when inpatient - denied any symptoms TOC RN discussed TOC vs CCM and patient/wife agreeable to put CCM on hold and follow for [facility] with plan to return to CCM, [name] at end of [facility] program  12/31/23: Patient states he is doing well. Had PCP and cardio appts since we last talked  - K+ was noted to be 3.3 and patient was instructed to take 40meq x 1 and then resume usual 20meq and states he did so. Patient also verbalized understanding that his iron  had been low and has his first iron  infusion 01/10/24. Discussed that [facility] RN was notifed by CCM, [name] that she received an alert when she added Oxygen  to med list re: oxygen  and Amiodarone and this was communicated with pharmacy and cardiology. No change in meds - TOC RN educated to monitor closely for any respiratory concerns and contact MD at first sign of any issues - Patient states he is doing well and is only getting short of breath when walking and states that has improved. Reports 95% on 3 Liters today - Patient states when he was at PCP office for 12/27/23 appt he mentioned to the nurse that he needed new glucometer, strips, lancets but he has not heard anything - TOC RN sent secure chat message advising PCP [name] of the request.   Heart Failure Interventions: Basic overview and discussion of pathophysiology of Heart Failure reviewed Provided education on low sodium diet Assessed need for readable accurate scales in home Provided education about placing scale on hard, flat surface Advised patient to weigh each morning after emptying bladder Discussed importance of daily weight and advised patient to weigh and record  daily Reviewed role of diuretics in prevention of fluid overload and management of heart failure; Discussed the importance of keeping all appointments with provider Assessed social determinant of health barriers   Patient Self Care Activities:  Attend all scheduled provider appointments Call pharmacy for medication refills 3-7 days in advance of running out of medications Call provider office for new concerns or questions  Notify RN Care Manager of TOC call rescheduling needs Participate in Transition of Care Program/Attend TOC scheduled calls Take medications as prescribed   call office  if I gain more than 2 pounds in one day or 5 pounds in one week keep legs up while sitting track weight in diary use salt in moderation watch for swelling in feet, ankles and legs every day weigh myself daily  Plan:  Telephone follow up appointment with care management team member scheduled for:  01/07/24 in the morning The patient has been provided with contact information for the care management team and has been advised to call with any health related questions or concerns.         Patient verbalizes understanding of instructions and care plan provided today and agrees to view in MyChart. Active MyChart status and patient understanding of how to access instructions and care plan via MyChart confirmed with patient.     Telephone follow up appointment with care management team member scheduled for: 01/07/24 The patient has been provided with contact information for the care management team and has been advised to call with any health related questions or concerns.   Please call the care guide team at [phone] if you need to cancel or reschedule your appointment.   Please call the Suicide and Crisis Lifeline: 988 call 1-800-273-TALK (toll free, 24 hour hotline) call 911 if you are experiencing a Mental Health or Behavioral Health Crisis or need someone to talk to.  Shona Prow RN, CCM [place]  VBCI-Population Health RN Care Manager [phone]

## 2023-12-31 NOTE — Telephone Encounter (Signed)
 Daughter Gwyneth) is following up on her FMLA paperwork to be able to take care of the patient.  Daughter noted documents need to be sent to [facility] 11/15 and was submitted on 10/ 31.  Daughter wants a call back from the Clinical Supervisor today.

## 2023-12-31 NOTE — Transitions of Care (Post Inpatient/ED Visit) (Signed)
 Transition of Care week 2  Visit Note  12/31/2023  Name: Thomas [name] MRN: [id number]          DOB: 08/26/1950  Situation: Patient enrolled in [facility] 30-day program. Visit completed with patient and wife by telephone.   Background: Admit/Discharge Date   10/27- 11/5  Jolynn Pack    Primary Diagnosis: Acute on chronic heart failure   Initial Transition Care Management Follow-up Telephone Call Discharge Date and Diagnosis: 12/22/23, Acute on chronic heart failure   Past Medical History:  Diagnosis Date   Acute on chronic diastolic CHF (congestive heart failure) (HCC) 06/01/2017   Arthritis    Cancer (HCC) 2010   Prostate   Chronic kidney disease    Diabetes mellitus    Diabetic neuropathy (HCC)    feet   DVT (deep venous thrombosis) (HCC)    Genetic testing 06/22/2016   Mr. Knight underwent genetic counseling and testing for hereditary cancer syndromes on 05/14/2016. His results were negative for mutations in all 46 genes analyzed by [employer]'s 46-gene Common Hereditary Cancers Panel. Genes analyzed include: APC, ATM, AXIN2, BARD1, BMPR1A, BRCA1, BRCA2, BRIP1, CDH1, CDKN2A, CHEK2, CTNNA1, DICER1, EPCAM, GREM1, HOXB13, KIT, MEN1, MLH1, MSH2, MSH3, MSH6, MUTYH, NB   GERD (gastroesophageal reflux disease)    Hypertension    PE (pulmonary thromboembolism) (HCC)    Pneumonia    Sleep apnea    not wearing CPAP   Suprapubic catheter (HCC) 11/17/2019    Assessment: Patient Reported Symptoms: Cognitive Cognitive Status: No symptoms reported, Normal speech and language skills, Alert and oriented to person, place, and time      Neurological Neurological Review of Symptoms: No symptoms reported    HEENT HEENT Symptoms Reported: No symptoms reported      Cardiovascular Cardiovascular Symptoms Reported: Swelling in legs or feet (wife states patient still has swelling but has improved and keeps legs elevated when sitting) Cardiovascular Management Strategies: Medication  therapy Weight: 278 lb 8 oz (126.3 kg) Cardiovascular Self-Management Outcome: 4 (good)  Respiratory Other Respiratory Symptoms: patient reports no shortness of breath unless he's up moving and states he recovers fast after walking - reports 95% today on 3 liters Respiratory Management Strategies: Oxygen  therapy  Endocrine Endocrine Symptoms Reported: No symptoms reported Is patient diabetic?: Yes Is patient checking blood sugars at home?: No Endocrine Comment: patient states he spoke with nurse at PCP office asking about new glucometer - TOC RN forwarded message requesting gluometer  Gastrointestinal Gastrointestinal Symptoms Reported: No symptoms reported      Genitourinary Genitourinary Symptoms Reported: No symptoms reported Other Genitourinary Symptoms: Patient has suprapubic cather and reports clear yellow uring Genitourinary Management Strategies: Catheter, indwelling  Integumentary Integumentary Symptoms Reported: No symptoms reported    Musculoskeletal          Psychosocial Psychosocial Symptoms Reported: No symptoms reported         Vitals:   12/31/23 1207  BP: 136/75  Pulse: 60  SpO2: 95%   Pain Scale: 0-10 Pain Score: 0-No pain  Medications Reviewed Today     Reviewed by Lauro Shona LABOR, RN (Registered Nurse) on 12/31/23 at 1156  Med List Status: <None>   Medication Order Taking? Sig Documenting Provider Last Dose Status Informant  Accu-Chek Softclix Lancets lancets [id number] Yes Use to test blood sugar daily [name], NP  Active Self, Pharmacy Records  amiodarone (PACERONE) 200 MG tablet [id number] Yes Take 2 tablets (400 mg total) by mouth 2 (two) times daily for 3 days, THEN 1  tablet (200 mg total) daily. Arlice Reichert, MD  Active   apixaban (ELIQUIS) 5 MG TABS tablet [id number] Yes Take 1 tablet (5 mg total) by mouth 2 (two) times daily. Arlice Reichert, MD  Active   atorvastatin  (LIPITOR) 40 MG tablet [id number] Yes TAKE 1 TABLET EVERY DAY [name],  Jessica K, NP  Active Self, Pharmacy Records  brimonidine (ALPHAGAN) 0.2 % ophthalmic solution [id number] Yes Place 1 drop into the left eye 2 (two) times daily.   Active Self, Pharmacy Records  calcium  carbonate (TUMS - DOSED IN MG ELEMENTAL CALCIUM ) 500 MG chewable tablet [id number]  Chew 2 tablets by mouth as needed for indigestion or heartburn.  Patient not taking: Reported on 12/31/2023   [provider]  Active Self, Pharmacy Records  glucose blood (ACCU-CHEK AVIVA PLUS) test strip [id number] Yes Use to test blood sugar daily. [name], NP  Active Self, Pharmacy Records  isosorbide  mononitrate (IMDUR ) 30 MG 24 hr tablet [id number] Yes Take 1 tablet (30 mg total) by mouth daily. [name], NP  Active Self, Pharmacy Records  metoprolol  succinate (TOPROL -XL) 50 MG 24 hr tablet [id number] Yes Take 1 tablet (50 mg total) by mouth daily. Take with or immediately following a meal. Lee, Jordan, NP  Active   Multiple Vitamin (MULTIVITAMIN ADULT PO) [id number]  Take 2 each by mouth daily as needed (immune system).  Patient not taking: Reported on 12/31/2023   [provider]  Active Self, Pharmacy Records  OXYGEN  [id number] Yes Inhale 3 L into the lungs continuous. [provider]  Active   potassium chloride  SA (KLOR-CON  M) 20 MEQ tablet [id number] Yes TAKE 1 TABLET TWICE DAILY Caro, Jessica K, NP  Active Self, Pharmacy Records           Med Note (SATTERFIELD, TEENA BRAVO   Tue Aug 03, 2023  5:39 PM)    Senna 8.7 MG CHEW [id number] Yes Chew 1 tablet by mouth as needed. [name], NP  Active Self, Pharmacy Records  tirzepatide  (MOUNJARO ) 15 MG/0.5ML Pen [id number] Yes Inject 15 mg into the skin once a week. Santo Stanly LABOR, MD  Active Self, Pharmacy Records           Med Note (SATTERFIELD, TEENA BRAVO   Tue Aug 03, 2023  5:36 PM) Take on sundays  torsemide (DEMADEX) 20 MG tablet [id number] Yes Take 1 tablet (20 mg) by mouth in the morning and 1 tablet  (20 mg) in the early afternoon. Campbell, Kenzie E, NP  Active Self, Pharmacy Records            Recommendation:   Continue Current Plan of Care  Follow Up Plan:   Telephone follow up appointment date/time:  01/07/24 in the morning  Shona Prow RN, CCM [place]  VBCI-Population Health RN Care Manager [phone]

## 2024-01-03 ENCOUNTER — Other Ambulatory Visit (HOSPITAL_COMMUNITY): Payer: Self-pay

## 2024-01-03 ENCOUNTER — Other Ambulatory Visit: Payer: Self-pay | Admitting: Nurse Practitioner

## 2024-01-03 ENCOUNTER — Telehealth: Payer: Self-pay | Admitting: *Deleted

## 2024-01-03 MED ORDER — ACCU-CHEK SOFTCLIX LANCETS MISC
3 refills | Status: AC
  Filled 2024-01-03 – 2024-01-04 (×2): qty 100, 90d supply, fill #0

## 2024-01-03 MED ORDER — ACCU-CHEK AVIVA PLUS W/DEVICE KIT
PACK | 0 refills | Status: AC
  Filled 2024-01-03: qty 1, 90d supply, fill #0
  Filled 2024-01-04: qty 1, 30d supply, fill #0

## 2024-01-03 MED ORDER — ACCU-CHEK AVIVA PLUS VI STRP
ORAL_STRIP | 3 refills | Status: AC
  Filled 2024-01-03: qty 50, 50d supply, fill #0
  Filled 2024-01-04: qty 100, 90d supply, fill #0

## 2024-01-03 NOTE — Telephone Encounter (Signed)
 Copied from CRM #[id number]. Topic: Clinical - Order For Equipment >> Jan 03, 2024  2:09 PM Susanna ORN wrote: Reason for CRM: Grayce Novak, nurse with [facility], called stating that patient was in office on 12/29/23 and saw [name]. States that he told her that his glucometer has gone missing and he's needing a new one. For further questions or concerns, please give Sonya a call back. CB #: [phone].

## 2024-01-03 NOTE — Telephone Encounter (Signed)
 New meter sent as requested.

## 2024-01-04 ENCOUNTER — Other Ambulatory Visit (HOSPITAL_COMMUNITY): Payer: Self-pay

## 2024-01-04 ENCOUNTER — Other Ambulatory Visit: Payer: Self-pay

## 2024-01-04 NOTE — Telephone Encounter (Signed)
 Two FMLA forms for patient's son and daughter were faxed to insurances and scanned into chart.  Billing notified.

## 2024-01-05 ENCOUNTER — Telehealth: Payer: Self-pay

## 2024-01-05 NOTE — Telephone Encounter (Signed)
 Copied from CRM [id number]. Topic: Clinical - Home Health Verbal Orders >> Jan 05, 2024 10:52 AM Susanna ORN wrote: Caller/Agency: Chyrl, Physical Therapist w/[facility] Callback Number: [phone]; voicemail is password protected Service Requested: Physical Therapy Frequency: One time a week for 8 weeks to work on strengthening, gait, and endurance. Any new concerns about the patient? No, everything was ok but did state that blood pressure was within allowable limits but were a little high during last few visits. Stated that someone may call about that.

## 2024-01-05 NOTE — Telephone Encounter (Signed)
 Left a detail secured voicemail for Chyrl, Physical Therapist w/[facility] given permission to the okay for the Home Health Verbal Orders for through our [facility] policy.

## 2024-01-07 ENCOUNTER — Telehealth: Payer: Self-pay

## 2024-01-07 DIAGNOSIS — E1122 Type 2 diabetes mellitus with diabetic chronic kidney disease: Secondary | ICD-10-CM | POA: Diagnosis not present

## 2024-01-07 DIAGNOSIS — I130 Hypertensive heart and chronic kidney disease with heart failure and stage 1 through stage 4 chronic kidney disease, or unspecified chronic kidney disease: Secondary | ICD-10-CM | POA: Diagnosis not present

## 2024-01-07 DIAGNOSIS — E538 Deficiency of other specified B group vitamins: Secondary | ICD-10-CM | POA: Diagnosis not present

## 2024-01-07 DIAGNOSIS — E785 Hyperlipidemia, unspecified: Secondary | ICD-10-CM | POA: Diagnosis not present

## 2024-01-07 DIAGNOSIS — Z8546 Personal history of malignant neoplasm of prostate: Secondary | ICD-10-CM | POA: Diagnosis not present

## 2024-01-07 DIAGNOSIS — J9611 Chronic respiratory failure with hypoxia: Secondary | ICD-10-CM | POA: Diagnosis not present

## 2024-01-07 DIAGNOSIS — Z96651 Presence of right artificial knee joint: Secondary | ICD-10-CM

## 2024-01-07 DIAGNOSIS — D509 Iron deficiency anemia, unspecified: Secondary | ICD-10-CM

## 2024-01-07 DIAGNOSIS — E662 Morbid (severe) obesity with alveolar hypoventilation: Secondary | ICD-10-CM | POA: Diagnosis not present

## 2024-01-07 DIAGNOSIS — Z87440 Personal history of urinary (tract) infections: Secondary | ICD-10-CM

## 2024-01-07 DIAGNOSIS — I5023 Acute on chronic systolic (congestive) heart failure: Secondary | ICD-10-CM | POA: Diagnosis not present

## 2024-01-07 DIAGNOSIS — I272 Pulmonary hypertension, unspecified: Secondary | ICD-10-CM | POA: Diagnosis not present

## 2024-01-07 NOTE — Patient Instructions (Signed)
 Visit Information  Thank you for taking time to visit with me today. Please don't hesitate to contact me if I can be of assistance to you before our next scheduled telephone appointment.  Our next appointment is by telephone on 01/11/24 in the morning  Following is a copy of your care plan:   Goals Addressed             This Visit's Progress    VBCI Transitions of Care (TOC) Care Plan       Problems:  Recent Hospitalization for treatment of Admit/Discharge Date   10/27- 11/5  Thomas [name]    Primary Diagnosis: Acute on chronic heart failure    Goal:  Over the next 30 days, the patient will not experience hospital readmission  Interventions:  Transitions of Care: Doctor Visits  - discussed the importance of doctor visits Reviewed the following discharge instructions with patient and wife: Discharge instructions for CHF Check weight daily -preferably same time every day. Restrict fluid intake to 1200 ml daily Restrict salt intake to less than 2 g daily. Call MD if you have one of the following symptoms 1) 3 pound weight gain in 24 hours or 5 pounds in 1 week  2) swelling in the hands, feet or stomach  3) progressive shortness of breath 4) if you must sleep on extra pillows at night in order to breathe [facility] RN asked about [employer] which was documented and patient/wife state patient does not have them and denied need stating no wounds and swelling has improved Reviewed hospital follow up appts - PCP and Heart & vascular are both 12/27/23 Reviewed oxygen  and patient confirms 3 liters O2 and reports O2 sat is 95-97% - Wife reports weight is 285lbs BP 125/81 HR 52 and states HR is often low - noted HR of 51 when inpatient - denied any symptoms TOC RN discussed TOC vs CCM and patient/wife agreeable to put CCM on hold and follow for [facility] with plan to return to CCM, [name] at end of [facility] program  12/31/23: Patient states he is doing well. Had PCP and cardio appts since we last talked  - K+ was noted to be 3.3 and patient was instructed to take 40meq x 1 and then resume usual 20meq and states he did so. Patient also verbalized understanding that his iron  had been low and has his first iron  infusion 01/10/24. Discussed that [facility] RN was notifed by CCM, [name] that she received an alert when she added Oxygen  to med list re: oxygen  and Amiodarone  and this was communicated with pharmacy and cardiology. No change in meds - TOC RN educated to monitor closely for any respiratory concerns and contact MD at first sign of any issues - Patient states he is doing well and is only getting short of breath when walking and states that has improved. Reports 95% on 3 Liters today - Patient states when he was at PCP office for 12/27/23 appt he mentioned to the nurse that he needed new glucometer, strips, lancets but he has not heard anything - TOC RN sent secure chat message advising PCP [name] of the request.  Update 01/07/24: Patient reports [facility] PT came Tues and nurse came yesterday - patient has infusion 01/10/24 IV Venofer   #1 of 3   then 12/1, 12/8 is 3 of 3; Patient confirms he received the new [name] glucometer and reports- BGM 135 yesterday and 118 today - reports weight 277.5lbs today -No MD visits since last  appt with TOC RN - wife reports BP machine is detecting Afib briefly, then read BP 157/91 HR 44, checked with pulse ox HR 70 93% - spouse checked via radial pulse per [facility] RN request and reports HR 79 - patient states he forgot to take his morning meds - no palpitation/dizziness - advised patient/wife that [facility] RN is sending a message to PCP and cardiologist, [name] and [name] sent via secure chat in EPIC as follows: - Just FYI - I am TOC RN on phone with patient and wife who state patient was checking BP and his monitor read A-Fib briefly then BP 157/91 HR 44, checked then via pulse ox HR 70 O2 sat 93%, wife checked radial pulse and reports HR 79 - Patient then  advised he'd forgotten to take his morning medications and has now done so and denies any symptoms.  Response from cardiology, Dr Kicks as follows: if monitor only noted afib briefly and heart rates are back to normal and he is not having any symptoms then no changes are need right now. We'll plan on seeing him for follow up in January. thanks for the update! TOC RN advised patient and wife to call provider if Afib returns and has symptoms or HR >100  TOC RN will follow up 01/11/24 as to follow up post IV Venofer  and reassess cardiac status    Heart Failure Interventions: Basic overview and discussion of pathophysiology of Heart Failure reviewed Provided education on low sodium diet Assessed need for readable accurate scales in home Provided education about placing scale on hard, flat surface Advised patient to weigh each morning after emptying bladder Discussed importance of daily weight and advised patient to weigh and record daily Reviewed role of diuretics in prevention of fluid overload and management of heart failure; Discussed the importance of keeping all appointments with provider Assessed social determinant of health barriers   Patient Self Care Activities:  Attend all scheduled provider appointments Call pharmacy for medication refills 3-7 days in advance of running out of medications Call provider office for new concerns or questions  Notify RN Care Manager of TOC call rescheduling needs Participate in Transition of Care Program/Attend TOC scheduled calls Take medications as prescribed   call office if I gain more than 2 pounds in one day or 5 pounds in one week keep legs up while sitting track weight in diary use salt in moderation watch for swelling in feet, ankles and legs every day weigh myself daily  Plan:  Telephone follow up appointment with care management team member scheduled for:  01/11/24 in the morning The patient has been provided with contact  information for the care management team and has been advised to call with any health related questions or concerns.         Patient verbalizes understanding of instructions and care plan provided today and agrees to view in MyChart. Active MyChart status and patient understanding of how to access instructions and care plan via MyChart confirmed with patient.     Telephone follow up appointment with care management team member scheduled for:01/11/24 The patient has been provided with contact information for the care management team and has been advised to call with any health related questions or concerns.   Please call the care guide team at [phone] if you need to cancel or reschedule your appointment.   Please call the Suicide and Crisis Lifeline: 988 call 1-800-273-TALK (toll free, 24 hour hotline) call 911 if you are experiencing  a Mental Health or Behavioral Health Crisis or need someone to talk to.  Shona Prow RN, CCM [place]  VBCI-Population Health RN Care Manager [phone]

## 2024-01-07 NOTE — Transitions of Care (Post Inpatient/ED Visit) (Signed)
 Transition of Care week 3  Visit Note  01/07/2024  Name: Thomas [name] MRN: [id number]          DOB: December 31, 1950  Situation: Patient enrolled in [facility] 30-day program. Visit completed with patient and wife by telephone.   Background: Admit/Discharge Date   10/27- 11/5  Thomas [name]    Primary Diagnosis: Acute on chronic heart failure   Initial Transition Care Management Follow-up Telephone Call Discharge Date and Diagnosis: 12/22/23, Acute on chronic heart failure   Past Medical History:  Diagnosis Date   Acute on chronic diastolic CHF (congestive heart failure) (HCC) 06/01/2017   Arthritis    Cancer (HCC) 2010   Prostate   Chronic kidney disease    Diabetes mellitus    Diabetic neuropathy (HCC)    feet   DVT (deep venous thrombosis) (HCC)    Genetic testing 06/22/2016   [name] underwent genetic counseling and testing for hereditary cancer syndromes on 05/14/2016. His results were negative for mutations in all 46 genes analyzed by [facility]'s 46-gene Common Hereditary Cancers Panel. Genes analyzed include: APC, ATM, AXIN2, BARD1, BMPR1A, BRCA1, BRCA2, BRIP1, CDH1, CDKN2A, CHEK2, CTNNA1, DICER1, EPCAM, GREM1, HOXB13, KIT, MEN1, MLH1, MSH2, MSH3, MSH6, MUTYH, NB   GERD (gastroesophageal reflux disease)    Hypertension    PE (pulmonary thromboembolism) (HCC)    Pneumonia    Sleep apnea    not wearing CPAP   Suprapubic catheter (HCC) 11/17/2019    Assessment: Patient Reported Symptoms: Cognitive Cognitive Status: No symptoms reported, Normal speech and language skills, Alert and oriented to person, place, and time      Neurological Neurological Review of Symptoms: No symptoms reported    HEENT HEENT Symptoms Reported: No symptoms reported      Cardiovascular Cardiovascular Symptoms Reported: No symptoms reported (patient denies swelling today - no other symptoms) Does patient have uncontrolled Hypertension?: Yes Is patient checking Blood Pressure at home?:  Yes Patient's Recent BP reading at home: 157/91 reported by patient today Cardiovascular Management Strategies: Medication therapy Weight: 277 lb (125.6 kg) Cardiovascular Self-Management Outcome: 4 (good)  Respiratory Respiratory Symptoms Reported: No symptoms reported Other Respiratory Symptoms: Patient states when he is sitting still he has no shortness of breath - reports 93% on 3 lIters - gets short of breath with ambulation Respiratory Management Strategies: Oxygen  therapy, Medication therapy  Endocrine Endocrine Symptoms Reported: No symptoms reported Is patient diabetic?: Yes Is patient checking blood sugars at home?: Yes List most recent blood sugar readings, include date and time of day: Patient reports he received the new glucometer - reports sugar yesterday was 135 and is 118 today Endocrine Self-Management Outcome: 4 (good)  Gastrointestinal Gastrointestinal Symptoms Reported: No symptoms reported      Genitourinary Genitourinary Symptoms Reported: Other Other Genitourinary Symptoms: Patient has suprapubic catheter and reports Dr Lamar Sar with [facility] manages this and changes cath Genitourinary Self-Management Outcome: 4 (good)  Integumentary Integumentary Symptoms Reported: No symptoms reported    Musculoskeletal Musculoskelatal Symptoms Reviewed: Weakness        Psychosocial Psychosocial Symptoms Reported: No symptoms reported         Today's Vitals   01/07/24 1125  Weight: 277 lb (125.6 kg)   Pain Scale: 0-10 Pain Score: 0-No pain  Medications Reviewed Today     Reviewed by Lauro Shona LABOR, RN (Registered Nurse) on 01/07/24 at 1118  Med List Status: <None>   Medication Order Taking? Sig Documenting Provider Last Dose Status Informant  Accu-Chek Softclix Lancets lancets [id number]  Yes Use to test blood sugar daily [name], Jessica K, NP  Active   amiodarone  (PACERONE ) 200 MG tablet [id number] Yes Take 2 tablets (400 mg total) by mouth 2 (two) times daily  for 3 days, THEN 1 tablet (200 mg total) daily. Arlice Reichert, MD  Active   apixaban  (ELIQUIS ) 5 MG TABS tablet [id number] Yes Take 1 tablet (5 mg total) by mouth 2 (two) times daily. Arlice Reichert, MD  Active   atorvastatin  (LIPITOR) 40 MG tablet [id number] Yes TAKE 1 TABLET EVERY DAY [name], Jessica K, NP  Active Self, Pharmacy Records  Blood Glucose Monitoring Suppl (ACCU-CHEK AVIVA PLUS) w/Device KIT [id number] Yes Use to test blood sugar daily. [name], NP  Active   brimonidine  (ALPHAGAN ) 0.2 % ophthalmic solution [id number] Yes Place 1 drop into the left eye 2 (two) times daily.   Active Self, Pharmacy Records  calcium  carbonate (TUMS - DOSED IN MG ELEMENTAL CALCIUM ) 500 MG chewable tablet [id number]  Chew 2 tablets by mouth as needed for indigestion or heartburn.  Patient not taking: Reported on 01/07/2024   [provider]  Active Self, Pharmacy Records  glucose blood (ACCU-CHEK AVIVA PLUS) test strip [id number] Yes Use to test blood sugar daily. [name], NP  Active   isosorbide  mononitrate (IMDUR ) 30 MG 24 hr tablet [id number] Yes Take 1 tablet (30 mg total) by mouth daily. [name], NP  Active Self, Pharmacy Records  metoprolol  succinate (TOPROL -XL) 50 MG 24 hr tablet [id number] Yes Take 1 tablet (50 mg total) by mouth daily. Take with or immediately following a meal. Lee, Jordan, NP  Active   Multiple Vitamin (MULTIVITAMIN ADULT PO) [id number]  Take 2 each by mouth daily as needed (immune system).  Patient not taking: Reported on 01/07/2024   [provider]  Active Self, Pharmacy Records  OXYGEN  [id number] Yes Inhale 3 L into the lungs continuous. [provider]  Active   potassium chloride  SA (KLOR-CON  M) 20 MEQ tablet [id number] Yes TAKE 1 TABLET TWICE DAILY Caro, Jessica K, NP  Active Self, Pharmacy Records           Med Note (SATTERFIELD, TEENA BRAVO   Tue Aug 03, 2023  5:39 PM)    Senna 8.7 MG CHEW [id number] Yes Chew 1 tablet by  mouth as needed. [name], NP  Active Self, Pharmacy Records  tirzepatide  (MOUNJARO ) 15 MG/0.5ML Pen [id number] Yes Inject 15 mg into the skin once a week. Santo Stanly LABOR, MD  Active Self, Pharmacy Records           Med Note (SATTERFIELD, TEENA BRAVO   Tue Aug 03, 2023  5:36 PM) Take on sundays  torsemide  (DEMADEX ) 20 MG tablet [id number] Yes Take 1 tablet (20 mg) by mouth in the morning and 1 tablet (20 mg) in the early afternoon. Campbell, Kenzie E, NP  Active Self, Pharmacy Records            Recommendation:   Continue Current Plan of Care  Follow Up Plan:   Telephone follow up appointment date/time:  01/11/24 in the morning  Shona Prow RN, CCM [place]  VBCI-Population Health RN Care Manager [phone]

## 2024-01-10 ENCOUNTER — Other Ambulatory Visit (HOSPITAL_COMMUNITY): Payer: Self-pay

## 2024-01-10 ENCOUNTER — Ambulatory Visit (HOSPITAL_COMMUNITY)
Admission: RE | Admit: 2024-01-10 | Discharge: 2024-01-10 | Disposition: A | Discharge status: Home / Self Care | Source: Ambulatory Visit | Attending: Cardiology | Admitting: Cardiology

## 2024-01-10 VITALS — BP 160/87 | HR 98 | Temp 96.6°F

## 2024-01-10 DIAGNOSIS — D509 Iron deficiency anemia, unspecified: Secondary | ICD-10-CM | POA: Diagnosis not present

## 2024-01-10 MED ORDER — IRON SUCROSE 300 MG IVPB - SIMPLE MED
300.0000 mg | Freq: Once | Status: AC
  Administered 2024-01-10: 300 mg via INTRAVENOUS
  Filled 2024-01-10: qty 300

## 2024-01-11 ENCOUNTER — Telehealth: Payer: Self-pay

## 2024-01-11 NOTE — Transitions of Care (Post Inpatient/ED Visit) (Signed)
 Transition of Care week 4  Visit Note  01/11/2024  Name: Thomas [name] MRN: [id number]          DOB: Sep 10, 1950  Situation: Patient enrolled in [facility] 30-day program. Visit completed with patient by telephone.   Background: Admit/Discharge Date   10/27- 11/5  Thomas [name]    Primary Diagnosis: Acute on chronic heart failure   Initial Transition Care Management Follow-up Telephone Call Discharge Date and Diagnosis: 12/22/23, Acute on chronic heart failure   Past Medical History:  Diagnosis Date   Acute on chronic diastolic CHF (congestive heart failure) (HCC) 06/01/2017   Arthritis    Cancer (HCC) 2010   Prostate   Chronic kidney disease    Diabetes mellitus    Diabetic neuropathy (HCC)    feet   DVT (deep venous thrombosis) (HCC)    Genetic testing 06/22/2016   Thomas [name] underwent genetic counseling and testing for hereditary cancer syndromes on 05/14/2016. His results were negative for mutations in all 46 genes analyzed by [facility]'s 46-gene Common Hereditary Cancers Panel. Genes analyzed include: APC, ATM, AXIN2, BARD1, BMPR1A, BRCA1, BRCA2, BRIP1, CDH1, CDKN2A, CHEK2, CTNNA1, DICER1, EPCAM, GREM1, HOXB13, KIT, MEN1, MLH1, MSH2, MSH3, MSH6, MUTYH, NB   GERD (gastroesophageal reflux disease)    Hypertension    PE (pulmonary thromboembolism) (HCC)    Pneumonia    Sleep apnea    not wearing CPAP   Suprapubic catheter (HCC) 11/17/2019    Assessment: Patient Reported Symptoms: Cognitive Cognitive Status: No symptoms reported, Normal speech and language skills, Alert and oriented to person, place, and time      Neurological Neurological Review of Symptoms: No symptoms reported    HEENT HEENT Symptoms Reported: No symptoms reported      Cardiovascular Cardiovascular Symptoms Reported: No symptoms reported Is patient checking Blood Pressure at home?: Yes Patient's Recent BP reading at home: Patient states his BP checked by Thomas [name] was 137/90 HR 67 Cardiovascular Management  Strategies: Medication therapy Weight: 273 lb 5 oz (124 kg) Cardiovascular Self-Management Outcome: 4 (good)  Respiratory Respiratory Symptoms Reported: Shortness of breath Other Respiratory Symptoms: Patient states no change since last call and states he gets short of breath when moving around but is okay when sitting and reports O2 sat at time of call is 94% on 3Liters oxygen  Respiratory Management Strategies: Oxygen  therapy  Endocrine Endocrine Symptoms Reported: No symptoms reported Is patient diabetic?: Yes Is patient checking blood sugars at home?: Yes List most recent blood sugar readings, include date and time of day: patient reports sugar this morning was 100 Endocrine Self-Management Outcome: 4 (good)  Gastrointestinal Gastrointestinal Symptoms Reported: No symptoms reported      Genitourinary Genitourinary Symptoms Reported: Other Other Genitourinary Symptoms: patient has suprapubic catheter and reports urine is clear    Integumentary Integumentary Symptoms Reported: No symptoms reported    Musculoskeletal Musculoskelatal Symptoms Reviewed: Weakness Additional Musculoskeletal Details: patient states he feels about 50%        Psychosocial Psychosocial Symptoms Reported: No symptoms reported         Today's Vitals   01/11/24 1503  Weight: 273 lb 5 oz (124 kg)      Medications Reviewed Today     Reviewed by Thomas Shona LABOR, RN (Registered Nurse) on 01/11/24 at 1459  Med List Status: <None>   Medication Order Taking? Sig Documenting Provider Last Dose Status Informant  Accu-Chek Softclix Lancets lancets [id number] Yes Use to test blood sugar daily Thomas [name], Thomas [name]  Active  amiodarone  (PACERONE ) 200 MG tablet [id number] Yes Take 2 tablets (400 mg total) by mouth 2 (two) times daily for 3 days, THEN 1 tablet (200 mg total) daily. Thomas Reichert, MD  Active   apixaban  (ELIQUIS ) 5 MG TABS tablet [id number] Yes Take 1 tablet (5 mg total) by mouth 2 (two) times daily.  Thomas Reichert, MD  Active   atorvastatin  (LIPITOR) 40 MG tablet [id number] Yes TAKE 1 TABLET EVERY DAY Thomas [name], Thomas [name]  Active Self, Pharmacy Records  Blood Glucose Monitoring Suppl (ACCU-CHEK AVIVA PLUS) w/Device KIT [id number] Yes Use to test blood sugar daily. Thomas Harlene POUR, [name]  Active   brimonidine  (ALPHAGAN ) 0.2 % ophthalmic solution [id number] Yes Place 1 drop into the left eye 2 (two) times daily.   Active Self, Pharmacy Records  calcium  carbonate (TUMS - DOSED IN MG ELEMENTAL CALCIUM ) 500 MG chewable tablet [id number]  Thomas [name] for indigestion or heartburn.  Patient not taking: Reported on 01/11/2024   [provider]  Active Self, Pharmacy Records  glucose blood (ACCU-CHEK AVIVA PLUS) test strip [id number] Yes Use to test blood sugar daily. Thomas Harlene POUR, [name]  Active   isosorbide  mononitrate (IMDUR ) 30 MG 24 hr tablet [id number] Yes Take 1 tablet (30 mg total) by mouth daily. Thomas Harlene POUR, [name]  Active Self, Pharmacy Records  metoprolol  succinate (TOPROL -XL) 50 MG 24 hr tablet [id number] Yes Take 1 tablet (50 mg total) by mouth daily. Take with or immediately following a meal. Thomas [name]  Active   Multiple Vitamin (MULTIVITAMIN ADULT PO) [id number]  Take 2 each by mouth daily as [name] (immune system).  Patient not taking: Reported on 01/11/2024   [provider]  Active Self, Pharmacy Records  OXYGEN  [id number] Yes Inhale 3 L into the lungs continuous. [provider]  Active   potassium chloride  SA (KLOR-CON  M) 20 MEQ tablet [id number] Yes TAKE 1 TABLET TWICE DAILY Thomas [name]  Active Self, Pharmacy Records           Med Note (SATTERFIELD, TEENA BRAVO   Tue Aug 03, 2023  5:39 PM)    Senna 8.7 MG Thomas [id number] Yes Thomas 1 tablet by mouth as [name]. Thomas Harlene POUR, [name]  Active Self, Pharmacy Records  tirzepatide  (MOUNJARO ) 15 MG/0.5ML Pen [id number] Yes Inject 15 mg into the skin once a week. Santo Stanly LABOR, MD  Active Self, Pharmacy Records           Med Note (SATTERFIELD, TEENA BRAVO   Tue Aug 03, 2023  5:36 PM) Take on sundays  torsemide  (DEMADEX ) 20 MG tablet [id number] Yes Take 1 tablet (20 mg) by mouth in the morning and 1 tablet (20 mg) in the early afternoon. Campbell, Kenzie E, [name]  Active Self, Pharmacy Records            Recommendation:   Continue Current Plan of Care  Follow Up Plan:   Telephone follow up appointment date/time:  01/20/24 in the morning  Shona Prow RN, CCM [place]  VBCI-Population Health RN Care Manager [phone]

## 2024-01-11 NOTE — Patient Instructions (Signed)
 Visit Information  Thank you for taking time to visit with me today. Please don't hesitate to contact me if I can be of assistance to you before our next scheduled telephone appointment.  Our next appointment is by telephone on 01/20/24 in the morning  Following is a copy of your care plan:   Goals Addressed             This Visit's Progress    VBCI Transitions of Care (TOC) Care Plan       Problems:  Recent Hospitalization for treatment of Admit/Discharge Date   10/27- 11/5  Thomas [name]    Primary Diagnosis: Acute on chronic heart failure    Goal:  Over the next 30 days, the patient will not experience hospital readmission  Interventions:  Transitions of Care: Doctor Visits  - discussed the importance of doctor visits Reviewed the following discharge instructions with patient and wife: Discharge instructions for CHF Check weight daily -preferably same time every day. Restrict fluid intake to 1200 ml daily Restrict salt intake to less than 2 g daily. Call MD if you have one of the following symptoms 1) 3 pound weight gain in 24 hours or 5 pounds in 1 week  2) swelling in the hands, feet or stomach  3) progressive shortness of breath 4) if you must sleep on extra pillows at night in order to breathe [name] RN asked about [employer] which was documented and patient/wife state patient does not have them and denied need stating no wounds and swelling has improved Reviewed hospital follow up appts - PCP and Heart & vascular are both 12/27/23 Reviewed oxygen  and patient confirms 3 liters O2 and reports O2 sat is 95-97% - Wife reports weight is 285lbs BP 125/81 HR 52 and states HR is often low - noted HR of 51 when inpatient - denied any symptoms TOC RN discussed TOC vs CCM and patient/wife agreeable to put CCM on hold and follow for [name] with plan to return to CCM, [name] at end of [name] program  12/31/23: Patient states he is doing well. Had PCP and cardio appts since we last talked -  K+ was noted to be 3.3 and patient was instructed to take 40meq x 1 and then resume usual 20meq and states he did so. Patient also verbalized understanding that his iron  had been low and has his first iron  infusion 01/10/24. Discussed that [name] RN was notifed by CCM, [name] that she received an alert when she added Oxygen  to med list re: oxygen  and Amiodarone  and this was communicated with pharmacy and cardiology. No change in meds - TOC RN educated to monitor closely for any respiratory concerns and contact MD at first sign of any issues - Patient states he is doing well and is only getting short of breath when walking and states that has improved. Reports 95% on 3 Liters today - Patient states when he was at PCP office for 12/27/23 appt he mentioned to the nurse that he needed new glucometer, strips, lancets but he has not heard anything - TOC RN sent secure chat message advising PCP [name] of the request.  Update 01/07/24: Patient reports [name] PT came Tues and nurse came yesterday - patient has infusion 01/10/24 IV Venofer   #1 of 3   then 12/1, 12/8 is 3 of 3; Patient confirms he received the new [name] glucometer and reports- BGM 135 yesterday and 118 today - reports weight 277.5lbs today -No MD visits since last  appt with TOC RN - wife reports BP machine is detecting Afib briefly, then read BP 157/91 HR 44, checked with pulse ox HR 70 93% - spouse checked via radial pulse per [facility] RN request and reports HR 79 - patient states he forgot to take his morning meds - no palpitation/dizziness - advised patient/wife that [name] RN is sending a message to PCP and cardiologist, [name] and [name] sent via secure chat in EPIC as follows: - Just FYI - I am TOC RN on phone with patient and wife who state patient was checking BP and his monitor read A-Fib briefly then BP 157/91 HR 44, checked then via pulse ox HR 70 O2 sat 93%, wife checked radial pulse and reports HR 79 - Patient then  advised he'd forgotten to take his morning medications and has now done so and denies any symptoms.  Response from cardiology, Dr Kicks as follows: if monitor only noted afib briefly and heart rates are back to normal and he is not having any symptoms then no changes are need right now. We'll plan on seeing him for follow up in January. thanks for the update! TOC RN advised patient and wife to call provider if Afib returns and has symptoms or HR >100  TOC RN will follow up 01/11/24 as to follow up post IV Venofer  and reassess cardiac status Update 01/11/24:Patient reports he had iron  infusion and feels he is doing well. Patient states Home Health PT was present today and his BP was 137/90 - Patient states he will follow with plan for next infusion and continue to monitor his BP, weight, sugar - Reviewed with patient the importance of notifying MD with onset of any s/s CHF in an effort to prevent re-hospitalization/worsening CHF. Patient voiced understanding. Patient agreeable to an additional follow up call with plan to close [name] program and refer back to CCM, [name]   Heart Failure Interventions: Basic overview and discussion of pathophysiology of Heart Failure reviewed Provided education on low sodium diet Assessed need for readable accurate scales in home Provided education about placing scale on hard, flat surface Advised patient to weigh each morning after emptying bladder Discussed importance of daily weight and advised patient to weigh and record daily Reviewed role of diuretics in prevention of fluid overload and management of heart failure; Discussed the importance of keeping all appointments with provider Assessed social determinant of health barriers   Patient Self Care Activities:  Attend all scheduled provider appointments Call pharmacy for medication refills 3-7 days in advance of running out of medications Call provider office for new concerns or questions  Notify RN  Care Manager of TOC call rescheduling needs Participate in Transition of Care Program/Attend TOC scheduled calls Take medications as prescribed   call office if I gain more than 2 pounds in one day or 5 pounds in one week keep legs up while sitting track weight in diary use salt in moderation watch for swelling in feet, ankles and legs every day weigh myself daily  Plan:  Telephone follow up appointment with care management team member scheduled for:  01/20/24 in the morning The patient has been provided with contact information for the care management team and has been advised to call with any health related questions or concerns.         Patient verbalizes understanding of instructions and care plan provided today and agrees to view in MyChart. Active MyChart status and patient understanding of how to access instructions and  care plan via MyChart confirmed with patient.     Telephone follow up appointment with care management team member scheduled for: 01/20/24 The patient has been provided with contact information for the care management team and has been advised to call with any health related questions or concerns.   Please call the care guide team at [phone] if you need to cancel or reschedule your appointment.   Please call the Suicide and Crisis Lifeline: 988 call 1-800-273-TALK (toll free, 24 hour hotline) call 911 if you are experiencing a Mental Health or Behavioral Health Crisis or need someone to talk to.  Shona Prow RN, CCM [place]  VBCI-Population Health RN Care Manager [phone]

## 2024-01-12 DIAGNOSIS — Z9359 Other cystostomy status: Secondary | ICD-10-CM | POA: Diagnosis not present

## 2024-01-12 DIAGNOSIS — R339 Retention of urine, unspecified: Secondary | ICD-10-CM | POA: Diagnosis not present

## 2024-01-17 ENCOUNTER — Ambulatory Visit (HOSPITAL_COMMUNITY)
Admission: RE | Admit: 2024-01-17 | Discharge: 2024-01-17 | Disposition: A | Source: Ambulatory Visit | Attending: Cardiology

## 2024-01-17 ENCOUNTER — Other Ambulatory Visit (HOSPITAL_COMMUNITY): Payer: Self-pay

## 2024-01-17 VITALS — BP 151/87 | HR 68 | Temp 97.5°F | Resp 16

## 2024-01-17 DIAGNOSIS — D509 Iron deficiency anemia, unspecified: Secondary | ICD-10-CM | POA: Diagnosis present

## 2024-01-17 MED ORDER — IRON SUCROSE 300 MG IVPB - SIMPLE MED
300.0000 mg | Freq: Once | Status: AC
  Administered 2024-01-17: 300 mg via INTRAVENOUS
  Filled 2024-01-17: qty 300

## 2024-01-18 ENCOUNTER — Other Ambulatory Visit: Payer: Self-pay

## 2024-01-18 ENCOUNTER — Other Ambulatory Visit (HOSPITAL_COMMUNITY): Payer: Self-pay

## 2024-01-18 NOTE — Patient Instructions (Signed)
 Visit Information  Thank you for taking time to visit with me today. Please don't hesitate to contact me if I can be of assistance to you before our next scheduled appointment.  Your next care management appointment is by telephone on Tuesday, December 23 at 10:30 AM  Please call the care guide team at [phone] if you need to cancel, schedule, or reschedule an appointment.   Please call 1-800-273-TALK (toll free, 24 hour hotline) if you are experiencing a Mental Health or Behavioral Health Crisis or need someone to talk to.  Clayborne Ly RN BSN CCM [place]  [facility] [place] [facility] Nurse Care Coordinator  Direct Dial: [phone] Website: [email or web address]

## 2024-01-18 NOTE — Patient Outreach (Unsigned)
 Complex Care Management   Visit Note  01/18/2024  Name:  Thomas [name] MRN: [id number] DOB: 1951-01-07  Situation: Referral received for Complex Care Management related to CHF, Pulmonary Hypertension, OSA w/CPAP, PAF (paroxysmal atrial fibrillation), Urinary Retention with Suprapubic Catheter. I obtained verbal consent from Patient.  Visit completed with Patient  on the phone.  Background:   Past Medical History:  Diagnosis Date   Acute on chronic diastolic CHF (congestive heart failure) (HCC) 06/01/2017   Arthritis    Cancer (HCC) 2010   Prostate   Chronic kidney disease    Diabetes mellitus    Diabetic neuropathy (HCC)    feet   DVT (deep venous thrombosis) (HCC)    Genetic testing 06/22/2016   Mr. Junkin underwent genetic counseling and testing for hereditary cancer syndromes on 05/14/2016. His results were negative for mutations in all 46 genes analyzed by [facility]'s 46-gene Common Hereditary Cancers Panel. Genes analyzed include: APC, ATM, AXIN2, BARD1, BMPR1A, BRCA1, BRCA2, BRIP1, CDH1, CDKN2A, CHEK2, CTNNA1, DICER1, EPCAM, GREM1, HOXB13, KIT, MEN1, MLH1, MSH2, MSH3, MSH6, MUTYH, NB   GERD (gastroesophageal reflux disease)    Hypertension    PE (pulmonary thromboembolism) (HCC)    Pneumonia    Sleep apnea    not wearing CPAP   Suprapubic catheter (HCC) 11/17/2019    Assessment: Patient Reported Symptoms:  Cognitive Cognitive Status: Alert and oriented to person, place, and time, Normal speech and language skills Cognitive/Intellectual Conditions Management [RPT]: None reported or documented in medical history or problem list   Health Maintenance Behaviors: Annual physical exam Health Facilitated by: Rest, Healthy diet  Neurological Neurological Review of Symptoms: Numbness, Vision changes Neurological Management Strategies: Adequate rest, Routine screening Neurological Self-Management Outcome: 4 (good) Neurological Comment: numbness to both feet, change in vision,  scheduled to see Dr. Cleatus for eye exam on 01/20/24  HEENT HEENT Symptoms Reported: Sudden change or loss of vision HEENT Management Strategies: Routine screening HEENT Self-Management Outcome: 3 (uncertain) HEENT Comment: scheduled to see Dr. Comer Cleatus on 01/20/24    Cardiovascular Cardiovascular Symptoms Reported: Swelling in legs or feet Does patient have uncontrolled Hypertension?: No Is patient checking Blood Pressure at home?: Yes Cardiovascular Management Strategies: Adequate rest, Diet modification, Routine screening, Medication therapy Cardiovascular Self-Management Outcome: 4 (good)  Respiratory Respiratory Symptoms Reported: Shortness of breath Respiratory Management Strategies: Oxygen  therapy, Routine screening, Medication therapy Respiratory Self-Management Outcome: 4 (good)  Endocrine Endocrine Symptoms Reported: No symptoms reported Is patient diabetic?: Yes Is patient checking blood sugars at home?: Yes List most recent blood sugar readings, include date and time of day: FBS 117 Endocrine Self-Management Outcome: 4 (good)  Gastrointestinal Gastrointestinal Symptoms Reported: No symptoms reported      Genitourinary Genitourinary Symptoms Reported: Other Other Genitourinary Symptoms: suprapubic catheter reports urine is clear, next scheduled catheter change is scheduled for 02/16/24 Genitourinary Management Strategies: Catheter, indwelling Genitourinary Self-Management Outcome: 4 (good)  Integumentary Integumentary Symptoms Reported: Skin changes Additional Integumentary Details: dryness Skin Management Strategies: Routine screening Skin Self-Management Outcome: 4 (good) Skin Comment: educated patient re: skin breakdown prevention  Musculoskeletal Musculoskelatal Symptoms Reviewed: Difficulty walking, Limited mobility, Unsteady gait Additional Musculoskeletal Details: patient is using a walker for ambulation, he is using a cane for ambulation Musculoskeletal  Management Strategies: Routine screening, Medical device Musculoskeletal Self-Management Outcome: 4 (good) Falls in the past year?: No Number of falls in past year: 1 or less Was there an injury with Fall?: No Fall Risk Category Calculator: 0 Patient Fall Risk Level: Low Fall Risk Patient  at Risk for Falls Due to: Impaired balance/gait, Impaired mobility Fall risk Follow up: Falls evaluation completed, Education provided  Psychosocial Psychosocial Symptoms Reported: No symptoms reported   Major Change/Loss/Stressor/Fears (CP): Denies      01/18/2024    PHQ2-9 Depression Screening   Vitali Seibert interest or pleasure in doing things    Feeling down, depressed, or hopeless    PHQ-2 - Total Score    Trouble falling or staying asleep, or sleeping too much    Feeling tired or having Ghali Morissette energy    Poor appetite or overeating     Feeling bad about yourself - or that you are a failure or have let yourself or your family down    Trouble concentrating on things, such as reading the newspaper or watching television    Moving or speaking so slowly that other people could have noticed.  Or the opposite - being so fidgety or restless that you have been moving around a lot more than usual    Thoughts that you would be better off dead, or hurting yourself in some way    PHQ2-9 Total Score    If you checked off any problems, how difficult have these problems made it for you to do your work, take care of things at home, or get along with other people    Depression Interventions/Treatment      There were no vitals filed for this visit. Pain Scale: Not given for pain  Medications Reviewed Today     Reviewed by Morgan Clayborne CROME, RN (Registered Nurse) on 01/18/24 at 1002  Med List Status: <None>   Medication Order Taking? Sig Documenting Provider Last Dose Status Informant  Accu-Chek Softclix Lancets lancets [id number]  Use to test blood sugar daily [name], NP  Active   amiodarone  (PACERONE )  200 MG tablet [id number]  Take 2 tablets (400 mg total) by mouth 2 (two) times daily for 3 days, THEN 1 tablet (200 mg total) daily. Arlice Reichert, MD  Active   apixaban  (ELIQUIS ) 5 MG TABS tablet [id number]  Take 1 tablet (5 mg total) by mouth 2 (two) times daily. Arlice Reichert, MD  Active   atorvastatin  (LIPITOR) 40 MG tablet [id number]  TAKE 1 TABLET EVERY DAY Caro, Jessica K, NP  Active Self, Pharmacy Records  Blood Glucose Monitoring Suppl (ACCU-CHEK AVIVA PLUS) w/Device KIT [id number]  Use to test blood sugar daily. [name], NP  Active   brimonidine  (ALPHAGAN ) 0.2 % ophthalmic solution [id number]  Place 1 drop into the left eye 2 (two) times daily.   Active Self, Pharmacy Records  calcium  carbonate (TUMS - DOSED IN MG ELEMENTAL CALCIUM ) 500 MG chewable tablet [id number]  Chew 2 tablets by mouth as needed for indigestion or heartburn.  Patient not taking: Reported on 01/11/2024   [provider]  Active Self, Pharmacy Records  glucose blood (ACCU-CHEK AVIVA PLUS) test strip [id number]  Use to test blood sugar daily. [name], NP  Active   isosorbide  mononitrate (IMDUR ) 30 MG 24 hr tablet [id number]  Take 1 tablet (30 mg total) by mouth daily. [name], NP  Active Self, Pharmacy Records  metoprolol  succinate (TOPROL -XL) 50 MG 24 hr tablet [id number]  Take 1 tablet (50 mg total) by mouth daily. Take with or immediately following a meal. Lee, Jordan, NP  Active   Multiple Vitamin (MULTIVITAMIN ADULT PO) [id number]  Take 2 each by mouth daily as needed (immune system).  Patient not taking: Reported on 01/11/2024  [provider]  Active Self, Pharmacy Records  OXYGEN  [id number]  Inhale 3 L into the lungs continuous. [provider]  Active   potassium chloride  SA (KLOR-CON  M) 20 MEQ tablet [id number]  TAKE 1 TABLET TWICE DAILY Caro, Jessica K, NP  Active Self, Pharmacy Records           Med Note (SATTERFIELD, TEENA BRAVO   Tue Aug 03, 2023  5:39 PM)     Senna 8.7 MG CHEW [id number]  Chew 1 tablet by mouth as needed. [name], NP  Active Self, Pharmacy Records  tirzepatide  (MOUNJARO ) 15 MG/0.5ML Pen [id number]  Inject 15 mg into the skin once a week. Santo Stanly LABOR, MD  Active Self, Pharmacy Records           Med Note (SATTERFIELD, TEENA BRAVO   Tue Aug 03, 2023  5:36 PM) Take on sundays  torsemide  (DEMADEX ) 20 MG tablet [id number]  Take 1 tablet (20 mg) by mouth in the morning and 1 tablet (20 mg) in the early afternoon. Campbell, Kenzie E, NP  Active Self, Pharmacy Records            Recommendation:   PCP Follow-up  02/07/2024 Status: Sch   Time: 9:20 AM Length: 20  Visit Type: OFFICE VISIT [8002] Copay: $0.00  Provider: [name], NP Department: [facility]   Specialty provider follow-up   01/24/2024 Status: Sch   Time: 10:00 AM Length: 180  Visit Type: IV MED 180 [439] Copay: $0.00  Provider: [id number] Department: [facility]    03/03/2024 Status: Sch   Time: 11:30 AM Length: 30  Visit Type: AF OFFICE VISIT 30 [1744] Copay: $0.00  Provider: Nellene Quita SAUNDERS, PA Department: [facility]   Follow Up Plan:   Telephone follow up appointment date/time:    02/08/2024 Status: Sch   Time: 10:30 AM Length: 30  Visit Type: VBCI TELEPHONE CALL 30 [2502] Copay: $0.00  Provider: Morgan Clayborne CROME, RN Department: [facility]   [name] RN BSN CCM [place]  [facility] Nurse Care Coordinator  Direct Dial: [phone] Website: [email or web address]

## 2024-01-20 ENCOUNTER — Telehealth: Payer: Self-pay

## 2024-01-20 DIAGNOSIS — H524 Presbyopia: Secondary | ICD-10-CM | POA: Diagnosis not present

## 2024-01-20 DIAGNOSIS — H468 Other optic neuritis: Secondary | ICD-10-CM | POA: Diagnosis not present

## 2024-01-20 NOTE — Telephone Encounter (Signed)
 Spoke with pt son, he reports the paperwork is complete and he has a copy.

## 2024-01-21 ENCOUNTER — Other Ambulatory Visit (HOSPITAL_COMMUNITY): Payer: Self-pay

## 2024-01-21 ENCOUNTER — Other Ambulatory Visit: Payer: Self-pay | Admitting: Orthopedic Surgery

## 2024-01-21 ENCOUNTER — Other Ambulatory Visit: Payer: Self-pay | Admitting: Internal Medicine

## 2024-01-21 ENCOUNTER — Other Ambulatory Visit: Payer: Self-pay | Admitting: Nurse Practitioner

## 2024-01-21 ENCOUNTER — Ambulatory Visit: Payer: Self-pay | Admitting: Nurse Practitioner

## 2024-01-21 ENCOUNTER — Telehealth: Payer: Self-pay | Admitting: Nurse Practitioner

## 2024-01-21 MED ORDER — APIXABAN 5 MG PO TABS
5.0000 mg | ORAL_TABLET | Freq: Two times a day (BID) | ORAL | 4 refills | Status: AC
  Filled 2024-01-21 – 2024-01-22 (×2): qty 60, 30d supply, fill #0
  Filled 2024-02-13 – 2024-02-14 (×2): qty 60, 30d supply, fill #1
  Filled 2024-03-16: qty 60, 30d supply, fill #2
  Filled 2024-03-21 (×2): qty 60, 30d supply, fill #0

## 2024-01-21 NOTE — Telephone Encounter (Unsigned)
 Copied from CRM [id number]. Topic: Clinical - Medication Refill >> Jan 21, 2024  1:49 PM Chiquita SQUIBB wrote: Medication: Mounjaro  tirzepatide  (MOUNJARO ) 15 MG/0.5ML Pen  amiodarone  amiodarone  (PACERONE ) 200 MG tablet  Patient is completely out of the medication and would like to get this medications tomorrow. Myles Railing of the turn around time.    Has the patient contacted their pharmacy? Yes (Agent: If no, request that the patient contact the pharmacy for the refill. If patient does not wish to contact the pharmacy document the reason why and proceed with request.) (Agent: If yes, when and what did the pharmacy advise?)  This is the patient's preferred pharmacy:  [facility]  [phone] [address] [place] [address]   Is this the correct pharmacy for this prescription? Yes If no, delete pharmacy and type the correct one.   Has the prescription been filled recently? No  Is the patient out of the medication? Yes  Has the patient been seen for an appointment in the last year OR does the patient have an upcoming appointment? Yes  Can we respond through MyChart? Yes  Agent: Please be advised that Rx refills may take up to 3 business days. We ask that you follow-up with your pharmacy.

## 2024-01-21 NOTE — Telephone Encounter (Addendum)
 Wife states that the last prescription request for Eliquis  was sent to the wrong provider. Patient is also completely out of Eliquis  as of this morning and he will be out of Amiodarone  tomorrow. Patient also needs a refill of Mounjaro .   Medications Requested: amiodarone  (PACERONE ) 200 MG tablet [[id number]] apixaban  (ELIQUIS ) 5 MG TABS tablet [[id number]] tirzepatide  (MOUNJARO ) 15 MG/0.5ML Pen [[id number]]  Wife would like these sent to: [facility] [address], [place] [address] Phone: [phone]  Fax: [phone]  She states they are usually mail delivered but she can go pick them up tomorrow at [facility] if they can be sent over there She can go pick them up tomorrow from there because there are open Saturdays. Wife states that the patient has no complaints  Patient's wife is advised to call us  back if anything changes or with any further questions/concerns. Patient's wife is advised that if anything worsens to go to the Emergency Room. Patient's wife verbalized understanding.   FYI Only or Action Required?: Action required by provider: medication refill request.  Patient was last seen in primary care on 12/27/2023 by Medina-Vargas, Monina C, NP.  Called Nurse Triage reporting Medication Refill.   Triage Disposition: Call PCP Now  Patient/caregiver understands and will follow disposition?: Yes          Message from Chiquita SQUIBB sent at 01/21/2024  1:49 PM EST  Reason for Triage: Patient is completely out of his apixaban  (ELIQUIS ) 5 MG TABS tablet [[id number]]. He took his last pill this morning and the patient is suppose to take two a day. The patients wife stated the pharmacy sent the refill request to the wrong doctor the other day. Patient would like it to go to [facility] in [facility].   Reason for Disposition  [1] Prescription refill request for ESSENTIAL medicine (i.e., likelihood of harm to patient if  not taken) AND [2] triager unable to refill per department policy  Answer Assessment - Initial Assessment Questions Wife states that the last prescription request for Eliquis  was sent to the wrong provider. Patient is also completely out of Eliquis  as of this morning and he will be out of Amiodarone  tomorrow. Patient also needs a refill of Mounjaro .   Medications Requested: amiodarone  (PACERONE ) 200 MG tablet [[id number]] apixaban  (ELIQUIS ) 5 MG TABS tablet [[id number]] tirzepatide  (MOUNJARO ) 15 MG/0.5ML Pen [[id number]]  Wife would like these sent to: [facility] [address], [place] [address] Phone: [phone]  Fax: [phone]  She states they are usually mail delivered but she can go pick them up tomorrow at [facility] if they can be sent over there She can go pick them up tomorrow from there because there are open Saturdays. Wife states that the patient has no complaints  Patient's wife is advised to call us  back if anything changes or with any further questions/concerns. Patient's wife is advised that if anything worsens to go to the Emergency Room. Patient's wife verbalized understanding.  Protocols used: Medication Refill and Renewal Call-A-AH

## 2024-01-21 NOTE — Telephone Encounter (Signed)
 Called [facility] and spoke with Alandra and she advised she would let Bethany know.

## 2024-01-21 NOTE — Telephone Encounter (Signed)
 Requested in a separate encounter on 01/21/24, this is a duplicate request.

## 2024-01-21 NOTE — Telephone Encounter (Signed)
 Pharmacy requested refill Last filled by ER Dr.  Erskine and sent to [facility] for approval.

## 2024-01-22 ENCOUNTER — Other Ambulatory Visit: Payer: Self-pay | Admitting: Internal Medicine

## 2024-01-22 ENCOUNTER — Other Ambulatory Visit (HOSPITAL_COMMUNITY): Payer: Self-pay

## 2024-01-22 DIAGNOSIS — N1832 Chronic kidney disease, stage 3b: Secondary | ICD-10-CM

## 2024-01-23 DIAGNOSIS — N3001 Acute cystitis with hematuria: Secondary | ICD-10-CM | POA: Diagnosis not present

## 2024-01-24 ENCOUNTER — Other Ambulatory Visit (HOSPITAL_COMMUNITY): Payer: Self-pay

## 2024-01-24 ENCOUNTER — Other Ambulatory Visit (HOSPITAL_COMMUNITY): Payer: Self-pay | Admitting: Cardiology

## 2024-01-24 ENCOUNTER — Telehealth: Payer: Self-pay | Admitting: Internal Medicine

## 2024-01-24 ENCOUNTER — Other Ambulatory Visit: Payer: Self-pay

## 2024-01-24 ENCOUNTER — Telehealth: Payer: Self-pay | Admitting: *Deleted

## 2024-01-24 ENCOUNTER — Inpatient Hospital Stay (HOSPITAL_COMMUNITY): Admission: RE | Admit: 2024-01-24 | Source: Ambulatory Visit

## 2024-01-24 MED ORDER — AMIODARONE HCL 200 MG PO TABS
200.0000 mg | ORAL_TABLET | Freq: Every day | ORAL | 3 refills | Status: AC
  Filled 2024-01-24: qty 90, 90d supply, fill #0

## 2024-01-24 MED ORDER — MOUNJARO 15 MG/0.5ML ~~LOC~~ SOAJ
15.0000 mg | SUBCUTANEOUS | 5 refills | Status: AC
  Filled 2024-01-24: qty 2, 28d supply, fill #0
  Filled 2024-02-23: qty 2, 28d supply, fill #1
  Filled 2024-03-19: qty 2, 28d supply, fill #2

## 2024-01-24 NOTE — Telephone Encounter (Signed)
 These medications are being prescribed through his cardiologist- we are not prescribing

## 2024-01-24 NOTE — Telephone Encounter (Signed)
 Spoke with Wife and message Refill sent to [facility] for approval. See previous message dated 01/24/24

## 2024-01-24 NOTE — Telephone Encounter (Signed)
 Copied from CRM [id number]. Topic: Clinical - Prescription Issue >> Jan 24, 2024  8:43 AM Diannia H wrote: Reason for CRM: Patients wife called in and she stated that one of his medicines (amiodarone ) were not filled as that's the most important one. She is not understanding how the other two were filled and not this one. She stated she called the pharmacy to inform the clinic that the patient was out of the medicine. I advised I saw the refill request in the charts but it still takes the 24-48 window on up to the 3 business days. She states she needed his heart medication asap. Called the clinic and spoke with Ms. Darice and she stated Donzell would assist the patient and call her.   Heron callback [phone]

## 2024-01-24 NOTE — Telephone Encounter (Signed)
 Pt calling to state he is out of medication. Please advise.

## 2024-01-24 NOTE — Telephone Encounter (Signed)
 Copied from CRM [id number]. Topic: Clinical - Prescription Issue >> Jan 24, 2024  8:28 AM Farrel B wrote: Reason for CRM: [id number] pts spouse called stating that she called in on Friday to request the pts medications she stated that the one most important medication was not sent in and that was amiodarone  (PACERONE ) 200 MG tablet. She also called in a requested refills on the apixaban  (ELIQUIS ) 5 MG TABS tablet and tirzepatide  (MOUNJARO ) 15 MG/0.5ML Pen, she stated the only she was able to pic up was the Apixaban  and he needed his heart medication the Pacerone . She advised that she understands the waiting time but felt that someone failed in submitting the remainder of the medication. I advised the pt spouse that with the medication refills I noticed the cardiologist had prescribed the Mounjaro  and the request was sent to the provider she stated she request his pcp fill it. I advised I saw the refill request in the charts but it still takes the 24-48 window on up to the 3 business days. She states she needed his heart medication asap

## 2024-01-24 NOTE — Telephone Encounter (Signed)
 Spoke with Wife and she stated that patient received the Eliquis  this weekend but needs the Amiodarone  and Mounjaro  sent to [facility] for daughter to pick up before the bad weather. Patient wife would like a call once done.   Rx's Pended and sent to [facility] for approval due to HIGH ALERT warning.

## 2024-01-24 NOTE — Telephone Encounter (Signed)
 Pt's medication was sent to pt's pharmacy as requested. Confirmation received.

## 2024-01-24 NOTE — Telephone Encounter (Signed)
 Spoke with patient and his wife. Shared message from Kenzie with patient:  Chart reviewed, looks like the patient should be on amiodarone  200 mg daily, please refill. Schedule him a follow up appointment with Santo MD or any APP in 4-6 weeks.   Amiodarone  Rx has been sent in. Patient requests to schedule appt with Dr. Santo for next available (04/18/24) and placed on waitlist. Scheduled appt with Kenzie as well for 03/10/24 in case an appt does not open up with Dr. Santo in that 4-6 week period.  No further needs voiced at this time.

## 2024-01-24 NOTE — Telephone Encounter (Signed)
 Called and spoke with wife and she will call the Cardiologist office to get refilled.

## 2024-01-24 NOTE — Telephone Encounter (Signed)
 *  STAT* If patient is at the pharmacy, call can be transferred to refill team.   1. Which medications need to be refilled? (please list name of each medication and dose if known)  amiodarone  (PACERONE ) 200 MG tablet    2. Which pharmacy/location (including street and city if local pharmacy) is medication to be sent to?   - [facility]    3. Do they need a 30 day or 90 day supply? 30

## 2024-01-27 ENCOUNTER — Other Ambulatory Visit (HOSPITAL_COMMUNITY): Payer: Self-pay

## 2024-01-28 ENCOUNTER — Inpatient Hospital Stay (HOSPITAL_COMMUNITY): Admission: RE | Admit: 2024-01-28 | Discharge: 2024-01-28 | Attending: Cardiology

## 2024-01-28 VITALS — BP 157/83 | HR 66 | Temp 97.6°F | Resp 16

## 2024-01-28 DIAGNOSIS — D509 Iron deficiency anemia, unspecified: Secondary | ICD-10-CM

## 2024-01-28 MED ORDER — IRON SUCROSE 300 MG IVPB - SIMPLE MED
300.0000 mg | Freq: Once | Status: AC
  Administered 2024-01-28: 300 mg via INTRAVENOUS
  Filled 2024-01-28: qty 300

## 2024-02-03 ENCOUNTER — Other Ambulatory Visit (HOSPITAL_COMMUNITY): Payer: Self-pay

## 2024-02-03 ENCOUNTER — Ambulatory Visit: Admitting: Orthopedic Surgery

## 2024-02-03 ENCOUNTER — Encounter: Payer: Self-pay | Admitting: Orthopedic Surgery

## 2024-02-03 VITALS — BP 118/84 | HR 70 | Temp 98.4°F | Wt 273.5 lb

## 2024-02-03 DIAGNOSIS — Z9359 Other cystostomy status: Secondary | ICD-10-CM | POA: Diagnosis not present

## 2024-02-03 DIAGNOSIS — R350 Frequency of micturition: Secondary | ICD-10-CM | POA: Diagnosis not present

## 2024-02-03 DIAGNOSIS — N183 Chronic kidney disease, stage 3 unspecified: Secondary | ICD-10-CM | POA: Diagnosis not present

## 2024-02-03 DIAGNOSIS — I129 Hypertensive chronic kidney disease with stage 1 through stage 4 chronic kidney disease, or unspecified chronic kidney disease: Secondary | ICD-10-CM | POA: Diagnosis not present

## 2024-02-03 LAB — POCT URINALYSIS DIPSTICK
Glucose, UA: NEGATIVE
Ketones, UA: NEGATIVE
Nitrite, UA: POSITIVE
Protein, UA: POSITIVE — AB
Spec Grav, UA: >=1.03 — AB (ref 1.010–1.025)
Urobilinogen, UA: 0.2 U/dL
pH, UA: 6 (ref 5.0–8.0)

## 2024-02-03 MED ORDER — CEPHALEXIN 500 MG PO CAPS
500.0000 mg | ORAL_CAPSULE | Freq: Four times a day (QID) | ORAL | 0 refills | Status: AC
  Filled 2024-02-03: qty 20, 5d supply, fill #0

## 2024-02-03 NOTE — Patient Instructions (Signed)
 Recommend seeing urology to discuss frequent UTI> possible antibiotic for UTI prevention  Drink water!!!  Consider adding cranberry juice or supplement for prevention   We will contact you with culture results

## 2024-02-03 NOTE — Progress Notes (Signed)
 Careteam: Patient Care Team: [name], NP as PCP - General (Geriatric Medicine) Mealor, Eulas BRAVO, MD as PCP - Electrophysiology (Cardiology) Santo Stanly LABOR, MD as PCP - Cardiology (Cardiology) Morgan Clayborne CROME, RN as [facility] Management Lelon Glendia ONEIDA DEVONNA as Physician Assistant (Cardiology) Marcey Elspeth PARAS, MD as Consulting Physician (Ophthalmology)  Seen by: Greig Cluster, AGNP-C  PLACE OF SERVICE:  [facility]IC  Advanced Directive information    Allergies[1]  Chief Complaint  Patient presents with   Acute Visit    Urine frequency      HPI: Patient is a 73 y.o. male seen today for acute visit due to urinary frequency.   He initially sought care at an urgent care facility on January 31, 2024, where he was prescribed nitrofurantoin  for seven days. He completed the course of antibiotics, which initially seemed effective, but symptoms persisted towards the end of the treatment. His wife noted that his urine remained cloudy. No fever, low back pain, or fatigue. He feels like he has  to go more often. His urine is described as tea-colored.  He is currently on a fluid restriction of 1200 mL per day due to a history of atrial fibrillation, which limits his water intake. Despite this, he drinks a significant amount of water daily, supplemented with vitamin water and occasionally iced tea, though he has reduced his intake of caffeinated beverages.  He has a suprapubic catheter, which increases his risk for UTIs. He has not seen his urologist recently but typically has his catheter changed monthly.  Review of Systems:  Review of Systems  Constitutional:  Negative for chills, fever and malaise/fatigue.  Respiratory: Negative.    Cardiovascular: Negative.   Genitourinary:  Negative for flank pain and hematuria.       Amber urine  Musculoskeletal:  Negative for back pain.  Psychiatric/Behavioral: Negative.      Past Medical History:  Diagnosis Date   Acute  on chronic diastolic CHF (congestive heart failure) (HCC) 06/01/2017   Arthritis    Cancer (HCC) 2010   Prostate   Chronic kidney disease    Diabetes mellitus    Diabetic neuropathy (HCC)    feet   DVT (deep venous thrombosis) (HCC)    Genetic testing 06/22/2016   Mr. Marcott underwent genetic counseling and testing for hereditary cancer syndromes on 05/14/2016. His results were negative for mutations in all 46 genes analyzed by [facility]'s 46-gene Common Hereditary Cancers Panel. Genes analyzed include: APC, ATM, AXIN2, BARD1, BMPR1A, BRCA1, BRCA2, BRIP1, CDH1, CDKN2A, CHEK2, CTNNA1, DICER1, EPCAM, GREM1, HOXB13, KIT, MEN1, MLH1, MSH2, MSH3, MSH6, MUTYH, NB   GERD (gastroesophageal reflux disease)    Hypertension    PE (pulmonary thromboembolism) (HCC)    Pneumonia    Sleep apnea    not wearing CPAP   Suprapubic catheter (HCC) 11/17/2019   Past Surgical History:  Procedure Laterality Date   CARDIOVERSION N/A 06/03/2017   Procedure: CARDIOVERSION;  Surgeon: Jeffrie Oneil BROCKS, MD;  Location: [facility] ENDOSCOPY;  Service: Cardiovascular;  Laterality: N/A;   CARDIOVERSION N/A 07/27/2023   Procedure: CARDIOVERSION;  Surgeon: Delford Maude BROCKS, MD;  Location: [facility] INVASIVE CV LAB;  Service: Cardiovascular;  Laterality: N/A;   CARDIOVERSION N/A 08/16/2023   Procedure: CARDIOVERSION;  Surgeon: Santo Stanly LABOR, MD;  Location: [facility] INVASIVE CV LAB;  Service: Cardiovascular;  Laterality: N/A;   CATARACT EXTRACTION Left 07/31/2021   Dr.Glenn, [facility]   COLONOSCOPY     COLONOSCOPY WITH PROPOFOL  N/A 10/20/2018  Procedure: COLONOSCOPY WITH PROPOFOL ;  Surgeon: Eda Iha, MD;  Location: [facility] ENDOSCOPY;  Service: Gastroenterology;  Laterality: N/A;   HERNIA REPAIR     KNEE SURGERY     MULTIPLE TOOTH EXTRACTIONS     POLYPECTOMY  10/20/2018   Procedure: POLYPECTOMY;  Surgeon: Eda Iha, MD;  Location: [facility] ENDOSCOPY;  Service: Gastroenterology;;   PROSTATE SURGERY     RADIOACTIVE  SEED IMPLANT     RIGHT/LEFT HEART CATH AND CORONARY ANGIOGRAPHY N/A 07/06/2017   Procedure: RIGHT/LEFT HEART CATH AND CORONARY ANGIOGRAPHY;  Surgeon: Dann Candyce RAMAN, MD;  Location: [facility] INVASIVE CV LAB;  Service: Cardiovascular;  Laterality: N/A;   SHOULDER SURGERY     TOTAL KNEE ARTHROPLASTY Right 11/19/2016   Procedure: RIGHT TOTAL KNEE ARTHROPLASTY;  Surgeon: Jerri Kay HERO, MD;  Location: [facility] OR;  Service: Orthopedics;  Laterality: Right;   uretha surgery-2014     Social History:   reports that he has never smoked. He has never been exposed to tobacco smoke. He has never used smokeless tobacco. He reports that he does not drink alcohol  and does not use drugs.  Family History  Problem Relation Age of Onset   Breast cancer Mother 59       d.89   Breast cancer Sister 30       d.30   Leukemia Brother 18       d.20   Breast cancer Maternal Aunt 40       d.40s   Lung cancer Maternal Uncle    Prostate cancer Paternal Uncle    Prostate cancer Brother        recurred recently at age 53   Other Brother 15       spinal tumor   Cervical cancer Other 22       d.22   Cancer Sister 55       unspecified type   Cancer Maternal Uncle 97       unspecified type    Medications: Patient's Medications  New Prescriptions   No medications on file  Previous Medications   ACCU-CHEK SOFTCLIX LANCETS LANCETS    Use to test blood sugar daily   AMIODARONE  (PACERONE ) 200 MG TABLET    Take 1 tablet (200 mg total) by mouth daily.   APIXABAN  (ELIQUIS ) 5 MG TABS TABLET    Take 1 tablet (5 mg total) by mouth 2 (two) times daily.   ATORVASTATIN  (LIPITOR) 40 MG TABLET    TAKE 1 TABLET EVERY DAY   BLOOD GLUCOSE MONITORING SUPPL (ACCU-CHEK AVIVA PLUS) W/DEVICE KIT    Use to test blood sugar daily.   BRIMONIDINE  (ALPHAGAN ) 0.2 % OPHTHALMIC SOLUTION    Place 1 drop into the left eye 2 (two) times daily.   CALCIUM  CARBONATE (TUMS - DOSED IN MG ELEMENTAL CALCIUM ) 500 MG CHEWABLE TABLET    Chew 2 tablets by  mouth as needed for indigestion or heartburn.   GLUCOSE BLOOD (ACCU-CHEK AVIVA PLUS) TEST STRIP    Use to test blood sugar daily.   ISOSORBIDE  MONONITRATE (IMDUR ) 30 MG 24 HR TABLET    Take 1 tablet (30 mg total) by mouth daily.   METOPROLOL  SUCCINATE (TOPROL -XL) 50 MG 24 HR TABLET    Take 1 tablet (50 mg total) by mouth daily. Take with or immediately following a meal.   MULTIPLE VITAMIN (MULTIVITAMIN ADULT PO)    Take 2 each by mouth daily as needed (immune system).   OXYGEN     Inhale 3 L into the lungs continuous.  POTASSIUM CHLORIDE  SA (KLOR-CON  M) 20 MEQ TABLET    TAKE 1 TABLET TWICE DAILY   SENNA 8.7 MG CHEW    Chew 1 tablet by mouth as needed.   TIRZEPATIDE  (MOUNJARO ) 15 MG/0.5ML PEN    Inject 15 mg into the skin once a week.   TORSEMIDE  (DEMADEX ) 20 MG TABLET    Take 1 tablet (20 mg) by mouth in the morning and 1 tablet (20 mg) in the early afternoon.  Modified Medications   No medications on file  Discontinued Medications   No medications on file    Physical Exam:  Vitals:   02/03/24 0913  BP: 118/84  Pulse: 70  Temp: 98.4 F (36.9 C)  SpO2: 94%  Weight: 273 lb 8 oz (124.1 kg)   Body mass index is 40.39 kg/m. Wt Readings from Last 3 Encounters:  02/03/24 273 lb 8 oz (124.1 kg)  01/18/24 271 lb 8 oz (123.2 kg)  01/11/24 273 lb 5 oz (124 kg)    Physical Exam Vitals reviewed.  Constitutional:      Appearance: He is obese.  HENT:     Head: Normocephalic.  Eyes:     General:        Right eye: No discharge.        Left eye: No discharge.  Cardiovascular:     Rate and Rhythm: Normal rate and regular rhythm.     Pulses: Normal pulses.     Heart sounds: Normal heart sounds.  Pulmonary:     Effort: Pulmonary effort is normal.     Breath sounds: Normal breath sounds.  Abdominal:     Palpations: Abdomen is soft.  Genitourinary:    Comments: Foley collection bag with amber urine Musculoskeletal:     Cervical back: Neck supple.     Right lower leg: Edema present.      Left lower leg: Edema present.  Skin:    General: Skin is warm.     Capillary Refill: Capillary refill takes less than 2 seconds.  Neurological:     General: No focal deficit present.     Mental Status: He is alert and oriented to person, place, and time.     Gait: Gait abnormal.  Psychiatric:        Mood and Affect: Mood normal.     Labs reviewed: Basic Metabolic Panel: Recent Labs    07/03/23 1730 07/04/23 0455 07/05/23 0546 07/06/23 0955 07/15/23 1206 08/02/23 1109 08/06/23 1118 08/07/23 1055 12/21/23 0922 12/22/23 0350 12/27/23 1045  NA  --    < > 139   < > 145   < >  --    < > 142 141 144  K  --    < > 4.1   < > 4.5   < >  --    < > 4.3 3.9 3.3*  CL  --    < > 109   < > 109   < >  --    < > 104 101 97*  CO2  --    < > 23   < > 26   < >  --    < > 24 27 32  GLUCOSE  --    < > 130*   < > 111   < >  --    < > 125* 141* 113*  BUN  --    < > 26*   < > 27*   < >  --    < >  52* 50* 35*  CREATININE  --    < > 1.65*   < > 1.77*   < >  --    < > 2.76* 2.86* 1.93*  CALCIUM   --    < > 8.3*   < > 8.8   < >  --    < > 8.9 8.9 8.8*  MG  --   --  2.2  --  2.0  --  2.2  --   --   --   --   TSH 1.058  --   --   --   --   --   --   --   --   --  4.034   < > = values in this interval not displayed.   Liver Function Tests: Recent Labs    08/02/23 1109 08/03/23 1238 12/27/23 1045  AST 15 25 39  ALT 34 38 51*  ALKPHOS  --  56 82  BILITOT 1.0 1.7* 1.7*  PROT 6.8 7.3 7.3  ALBUMIN  --  3.7 3.5   No results for input(s): LIPASE, AMYLASE in the last 8760 hours. No results for input(s): AMMONIA in the last 8760 hours. CBC: Recent Labs    12/15/23 0243 12/16/23 0244 12/17/23 0250  WBC 6.8 6.9 6.8  NEUTROABS 4.4 4.9 4.3  HGB 11.5* 11.6* 11.8*  HCT 35.7* 36.6* 37.0*  MCV 91.5 92.9 90.9  PLT 236 208 238   Lipid Panel: Recent Labs    08/19/23 0934 11/25/23 0936  CHOL 86 89*  HDL 40 40  LDLCALC 33 36  TRIG 46 53  CHOLHDL 2.2 2.2   TSH: Recent Labs     07/03/23 1730 12/27/23 1045  TSH 1.058 4.034   A1C: Lab Results  Component Value Date   HGBA1C 5.7 (H) 08/19/2023     Assessment/Plan 1. Urine frequency (Primary) - 12/15 urgent care> prescribed nitrofurantoin  x 7 days - amber urine on exam otherwise asymptomatic - increased risk for infection due to suprapubic catheter - POC Urinalysis Dipstick> + leukocytes, nitrates, blood - encourage hydration with water - discussed seeing urology about frequent UTIs - cephALEXin  (KEFLEX ) 500 MG capsule; Take 1 capsule (500 mg total) by mouth 4 (four) times daily for 5 days.  Dispense: 20 capsule; Refill: 0 - Culture, Urine  2. Presence of suprapubic catheter (HCC) - see above - unclear when last exchange was   3. Benign hypertension with chronic kidney disease, stage III (HCC) - see above - not on medication   Total time: 22 minutes. Greater than 50% of total time spent doing patient education regarding urinary frequency/amber color, risk for infection, frequent UTI and prevention including symptom/medication management.   Next appt: Visit date not found  Lizbett Garciagarcia Gil BODILY  [facility] [phone]     [1]  Allergies Allergen Reactions   Lisinopril  Cough    Muscle pain

## 2024-02-04 LAB — URINE CULTURE
MICRO NUMBER:: 17374852
SPECIMEN QUALITY:: ADEQUATE

## 2024-02-06 ENCOUNTER — Ambulatory Visit: Payer: Self-pay | Admitting: Orthopedic Surgery

## 2024-02-07 ENCOUNTER — Encounter: Payer: Self-pay | Admitting: Nurse Practitioner

## 2024-02-08 ENCOUNTER — Other Ambulatory Visit: Payer: Self-pay

## 2024-02-08 NOTE — Patient Instructions (Signed)
 Visit Information  Thank you for taking time to visit with me today. Please don't hesitate to contact me if I can be of assistance to you before our next scheduled appointment.  Your next care management appointment is by telephone on Monday, January 26 at 09:30 AM  Please call the care guide team at [phone] if you need to cancel, schedule, or reschedule an appointment.   Please call [phone]-TALK (toll free, 24 hour hotline) if you are experiencing a Mental Health or Behavioral Health Crisis or need someone to talk to.  Clayborne Ly RN BSN CCM [place]  [facility], [facility] Nurse Care Coordinator  Direct Dial: [phone] Website: [email or web address]

## 2024-02-08 NOTE — Patient Outreach (Addendum)
 Complex Care Management   Visit Note  02/08/2024  Name:  Thomas [name] MRN: [id number] DOB: 1950/03/07  Situation: Referral received for Complex Care Management related to  CHF, Pulmonary Hypertension, OSA w/CPAP, PAF (paroxysmal atrial fibrillation), Urinary Retention with Suprapubic Catheter. I obtained verbal consent from Patient.  Visit completed with Patient on the phone.  Background:   Past Medical History:  Diagnosis Date   Acute on chronic diastolic CHF (congestive heart failure) (HCC) 06/01/2017   Arthritis    Cancer (HCC) 2010   Prostate   Chronic kidney disease    Diabetes mellitus    Diabetic neuropathy (HCC)    feet   DVT (deep venous thrombosis) (HCC)    Genetic testing 06/22/2016   Thomas [name] underwent genetic counseling and testing for hereditary cancer syndromes on 05/14/2016. His results were negative for mutations in all 46 genes analyzed by [facility]'s 46-gene Common Hereditary Cancers Panel. Genes analyzed include: APC, ATM, AXIN2, BARD1, BMPR1A, BRCA1, BRCA2, BRIP1, CDH1, CDKN2A, CHEK2, CTNNA1, DICER1, EPCAM, GREM1, HOXB13, KIT, MEN1, MLH1, MSH2, MSH3, MSH6, MUTYH, NB   GERD (gastroesophageal reflux disease)    Hypertension    PE (pulmonary thromboembolism) (HCC)    Pneumonia    Sleep apnea    not wearing CPAP   Suprapubic catheter (HCC) 11/17/2019    Assessment: Patient Reported Symptoms:  Cognitive Cognitive Status: Alert and oriented to person, place, and time, Normal speech and language skills Cognitive/Intellectual Conditions Management [RPT]: None reported or documented in medical history or problem list   Health Maintenance Behaviors: Annual physical exam Health Facilitated by: Healthy diet, Rest  Neurological Neurological Review of Symptoms: Numbness Oher Neurological Symptoms/Conditions [RPT]: restless legs with numbness to both feet but stable, vision improved with new Rx Neurological Management Strategies: Medication therapy, Routine  screening Neurological Self-Management Outcome: 4 (good)  HEENT HEENT Symptoms Reported: No symptoms reported      Cardiovascular Cardiovascular Symptoms Reported: Chest pain or discomfort Does patient have uncontrolled Hypertension?: No Is patient checking Blood Pressure at home?: Yes Cardiovascular Management Strategies: Adequate rest, Diet modification, Medication therapy, Routine screening, Weight management, Fluid modification Do You Have a Working Readable Scale?: Yes Weight: 273 lb (123.8 kg) Cardiovascular Self-Management Outcome: 4 (good) Educated patient re: symptoms suggestive of MI and when to call 911 if symptoms occur; Sent in basket message to Cardiology team re: recent episode of chest pain   Respiratory Respiratory Symptoms Reported: Shortness of breath Respiratory Management Strategies: Routine screening, Oxygen  therapy, Coping strategies, Adequate rest, Medication therapy Respiratory Self-Management Outcome: 4 (good)  Endocrine Endocrine Symptoms Reported: No symptoms reported Is patient diabetic?: Yes Is patient checking blood sugars at home?: Yes List most recent blood sugar readings, include date and time of day: FBS 141 after a meal Endocrine Self-Management Outcome: 4 (good)  Gastrointestinal Gastrointestinal Symptoms Reported: Reflux/heartburn Additional Gastrointestinal Details: Questionable reflux? Gastrointestinal Management Strategies: Adequate rest Gastrointestinal Self-Management Outcome: 4 (good)    Genitourinary Genitourinary Symptoms Reported: Other, Pain/burning with urination Other Genitourinary Symptoms: suprapubic catheter, new onset of pain with urination, PCP treating with antibiotic Additional Genitourinary Details: Dr. Janit with [facility] for Catheter Changes, next scheduled visit set for 02/16/24 Genitourinary Management Strategies: Catheter, indwelling (Suprapubic Catheter) Genitourinary Self-Management Outcome: 4 (good) Genitourinary  Comment: Cath changes per Dr. Janit with [facility]  Integumentary Integumentary Symptoms Reported: No symptoms reported    Musculoskeletal Musculoskelatal Symptoms Reviewed: Limited mobility, Difficulty walking, Unsteady gait Additional Musculoskeletal Details: patient using a walker and motorized WC, needs a manual WC  for ambulatory; exercising on home elipitcal Musculoskeletal Management Strategies: Medical device, Routine screening, Adequate rest, Exercise Musculoskeletal Self-Management Outcome: 4 (good) Falls in the past year?: No Number of falls in past year: 1 or less Was there an injury with Fall?: No Fall Risk Category Calculator: 0 Patient Fall Risk Level: Low Fall Risk    Psychosocial Psychosocial Symptoms Reported: No symptoms reported   Major Change/Loss/Stressor/Fears (CP): Denies Quality of Family Relationships: helpful, involved, supportive Do you feel physically threatened by others?: No    02/08/2024    PHQ2-9 Depression Screening   Thomas [name] interest or pleasure in doing things    Feeling down, depressed, or hopeless    PHQ-2 - Total Score    Trouble falling or staying asleep, or sleeping too much    Feeling tired or having Thomas [name] energy    Poor appetite or overeating     Feeling bad about yourself - or that you are a failure or have let yourself or your family down    Trouble concentrating on things, such as reading the newspaper or watching television    Moving or speaking so slowly that other people could have noticed.  Or the opposite - being so fidgety or restless that you have been moving around a lot more than usual    Thoughts that you would be better off dead, or hurting yourself in some way    PHQ2-9 Total Score    If you checked off any problems, how difficult have these problems made it for you to do your work, take care of things at home, or get along with other people    Depression Interventions/Treatment      Today's Vitals   02/08/24 1045   Weight: 273 lb (123.8 kg)   Pain Scale: Not given for pain  Medications Reviewed Today     Reviewed by Thomas Clayborne CROME, RN (Registered Nurse) on 02/08/24 at 1108  Med List Status: <None>   Medication Order Taking? Sig Documenting Provider Last Dose Status Informant  Accu-Chek Softclix Lancets lancets [id number] Yes Use to test blood sugar daily [name], Jessica K, NP  Active   amiodarone  (PACERONE ) 200 MG tablet [id number] Yes Take 1 tablet (200 mg total) by mouth daily. Campbell, Kenzie E, NP  Active   apixaban  (ELIQUIS ) 5 MG TABS tablet [id number] Yes Take 1 tablet (5 mg total) by mouth 2 (two) times daily. Gil Greig BRAVO, NP  Active   atorvastatin  (LIPITOR) 40 MG tablet [id number] Yes TAKE 1 TABLET EVERY DAY [name], Jessica K, NP  Active Self, Pharmacy Records  Blood Glucose Monitoring Suppl (ACCU-CHEK AVIVA PLUS) w/Device KIT [id number] Yes Use to test blood sugar daily. [name], NP  Active   brimonidine  (ALPHAGAN ) 0.2 % ophthalmic solution [id number] Yes Place 1 drop into the left eye 2 (two) times daily.   Active Self, Pharmacy Records  cephALEXin  (KEFLEX ) 500 MG capsule [id number]  Take 1 capsule (500 mg total) by mouth 4 (four) times daily for 5 days. Fargo, Amy E, NP  Active   glucose blood (ACCU-CHEK AVIVA PLUS) test strip [id number] Yes Use to test blood sugar daily. [name], NP  Active   isosorbide  mononitrate (IMDUR ) 30 MG 24 hr tablet [id number] Yes Take 1 tablet (30 mg total) by mouth daily. [name], NP  Active Self, Pharmacy Records  metoprolol  succinate (TOPROL -XL) 50 MG 24 hr tablet [id number] Yes Take 1 tablet (50 mg total) by mouth daily. Take with or immediately following a meal.  Lee, Jordan, NP  Active   OXYGEN  [id number] Yes Inhale 3 L into the lungs continuous. [provider]  Active   potassium chloride  SA (KLOR-CON  M) 20 MEQ tablet [id number] Yes TAKE 1 TABLET TWICE DAILY Caro, Jessica K, NP  Active Self, Pharmacy Records            Med Note (SATTERFIELD, TEENA BRAVO   Tue Aug 03, 2023  5:39 PM)    Senna 8.7 MG CHEW [id number] Yes Chew 1 tablet by mouth as needed. [name], NP  Active Self, Pharmacy Records  tirzepatide  (MOUNJARO ) 15 MG/0.5ML Pen [id number] Yes Inject 15 mg into the skin once a week. Santo Stanly LABOR, MD  Active   torsemide  (DEMADEX ) 20 MG tablet [id number] Yes Take 1 tablet (20 mg) by mouth in the morning and 1 tablet (20 mg) in the early afternoon. Campbell, Kenzie E, NP  Active Self, Pharmacy Records            Recommendation:   Specialty provider follow-up   02/16/2024 9:30 AM EST Clinical Support [facility]  [address]  [place], [place] [address]  [phone]     03/03/2024 Status: Sch   Time: 11:30 AM Length: 30  Visit Type: AF OFFICE VISIT 30 [1744] Copay: $0.00  Provider: Nellene Quita SAUNDERS, PA Department: [facility]    03/10/2024 Status: Sch   Time: 8:00 AM Length: 45  Visit Type: OFFICE VISIT [1004] Copay: $15.00  Provider: Elaine Miriam BRAVO, NP Department: [facility] [address]   03/13/2024 10:00 AM EST Clinical Support [facility] - [facility]  [address]  [place], [place] [address]  [phone]    Follow Up Plan:   Telephone follow up appointment date/time:     03/13/2024 Status: Sch   Time: 9:30 AM Length: 30  Visit Type: VBCI TELEPHONE CALL 30 [2502] Copay: $0.00  Provider: Morgan Clayborne CROME, RN Department: [facility]   [name] RN BSN CCM [place]  [facility], [facility] Nurse Care Coordinator  Direct Dial: [phone] Website: [email or web address]

## 2024-02-13 ENCOUNTER — Other Ambulatory Visit (HOSPITAL_COMMUNITY): Payer: Self-pay

## 2024-02-14 ENCOUNTER — Other Ambulatory Visit (HOSPITAL_COMMUNITY): Payer: Self-pay

## 2024-02-14 NOTE — Progress Notes (Signed)
 This encounter was created in error - please disregard.

## 2024-02-15 ENCOUNTER — Other Ambulatory Visit (HOSPITAL_COMMUNITY): Payer: Self-pay

## 2024-02-17 ENCOUNTER — Other Ambulatory Visit: Payer: Self-pay | Admitting: Nurse Practitioner

## 2024-02-17 DIAGNOSIS — I10 Essential (primary) hypertension: Secondary | ICD-10-CM

## 2024-02-23 ENCOUNTER — Other Ambulatory Visit (HOSPITAL_COMMUNITY): Payer: Self-pay

## 2024-02-23 ENCOUNTER — Other Ambulatory Visit: Payer: Self-pay

## 2024-02-24 ENCOUNTER — Other Ambulatory Visit: Payer: Self-pay

## 2024-02-27 ENCOUNTER — Emergency Department (HOSPITAL_BASED_OUTPATIENT_CLINIC_OR_DEPARTMENT_OTHER)
Admission: EM | Admit: 2024-02-27 | Discharge: 2024-02-27 | Disposition: A | Discharge status: Home / Self Care | Attending: Emergency Medicine | Admitting: Emergency Medicine

## 2024-02-27 DIAGNOSIS — T83098A Other mechanical complication of other indwelling urethral catheter, initial encounter: Secondary | ICD-10-CM | POA: Insufficient documentation

## 2024-02-27 DIAGNOSIS — Y732 Prosthetic and other implants, materials and accessory gastroenterology and urology devices associated with adverse incidents: Secondary | ICD-10-CM | POA: Insufficient documentation

## 2024-02-27 DIAGNOSIS — Z7901 Long term (current) use of anticoagulants: Secondary | ICD-10-CM | POA: Diagnosis not present

## 2024-02-27 DIAGNOSIS — I10 Essential (primary) hypertension: Secondary | ICD-10-CM | POA: Diagnosis not present

## 2024-02-27 DIAGNOSIS — E119 Type 2 diabetes mellitus without complications: Secondary | ICD-10-CM | POA: Insufficient documentation

## 2024-02-27 DIAGNOSIS — Z794 Long term (current) use of insulin: Secondary | ICD-10-CM | POA: Insufficient documentation

## 2024-02-27 DIAGNOSIS — Z79899 Other long term (current) drug therapy: Secondary | ICD-10-CM | POA: Diagnosis not present

## 2024-02-27 NOTE — ED Triage Notes (Signed)
 Pt arrived POV reporting for the past week his suprapubic cath drainage has become less and less until yesterday when it began feeling obstructed with failed attempts to irrigate. Last changed at urology 12/31, changed monthly.

## 2024-02-27 NOTE — ED Notes (Signed)
 DC paperwork given and verbally understood.

## 2024-02-27 NOTE — Discharge Instructions (Signed)
 Please follow-up with your primary care doctor, urologist, return if you have any additional complications with your suprapubic catheter.  It was a pleasure taking care of you today.

## 2024-02-27 NOTE — ED Provider Notes (Signed)
 " [place] [facility] AT [facility] Provider Note   CSN: [id number] Arrival date & time: 02/27/24  1246     Patient presents with: Foley Catheter Issue   Thomas [name] is a 74 y.o. male with past medical history significant for obesity, hypertension, hyperlipidemia, diabetes, paroxysmal A-fib, chronic indwelling suprapubic catheter who presents with concern for catheter becoming less and less able to drain.  Feels like it has been fully obstructed since yesterday.  Failed attempts to irrigate at home.  Last exchange 12/31.  Normally changed monthly.  He denies any pain around the area of insertion.  He denies any fever, chills, shortness of breath compared to his baseline.  HPI     Prior to Admission medications  Medication Sig Start Date End Date Taking? Authorizing Provider  Accu-Chek Softclix Lancets lancets Use to test blood sugar daily 01/03/24   [name], Jessica K, NP  amiodarone  (PACERONE ) 200 MG tablet Take 1 tablet (200 mg total) by mouth daily. 01/24/24   Campbell, Kenzie E, NP  apixaban  (ELIQUIS ) 5 MG TABS tablet Take 1 tablet (5 mg total) by mouth 2 (two) times daily. 01/21/24   Fargo, Amy E, NP  atorvastatin  (LIPITOR) 40 MG tablet TAKE 1 TABLET EVERY DAY 10/06/23   [name], Jessica K, NP  Blood Glucose Monitoring Suppl (ACCU-CHEK AVIVA PLUS) w/Device KIT Use to test blood sugar daily. 01/03/24   [name], Jessica K, NP  brimonidine  (ALPHAGAN ) 0.2 % ophthalmic solution Place 1 drop into the left eye 2 (two) times daily. 12/07/23     glucose blood (ACCU-CHEK AVIVA PLUS) test strip Use to test blood sugar daily. 01/03/24   [name], NP  isosorbide  mononitrate (IMDUR ) 30 MG 24 hr tablet Take 1 tablet (30 mg total) by mouth daily. 10/04/23   [name], NP  metoprolol  succinate (TOPROL -XL) 50 MG 24 hr tablet Take 1 tablet (50 mg total) by mouth daily. Take with or immediately following a meal. 12/27/23   Lee, Jordan, NP  OXYGEN  Inhale 3 L  into the lungs continuous.    [provider]  potassium chloride  SA (KLOR-CON  M) 20 MEQ tablet TAKE 1 TABLET TWICE DAILY 01/07/23   [name], Jessica K, NP  Senna 8.7 MG CHEW Chew 1 tablet by mouth as needed. 05/20/21   [name], NP  tirzepatide  (MOUNJARO ) 15 MG/0.5ML Pen Inject 15 mg into the skin once a week. 01/24/24   Santo Stanly LABOR, MD  torsemide  (DEMADEX ) 20 MG tablet Take 1 tablet (20 mg) by mouth in the morning and 1 tablet (20 mg) in the early afternoon. 11/25/23   Campbell, Kenzie E, NP    Allergies: Lisinopril     Review of Systems  All other systems reviewed and are negative.   Updated Vital Signs BP (!) 149/81 (BP Location: Right Wrist)   Pulse 63   Temp 97.8 F (36.6 C)   Resp 20   SpO2 99%   Physical Exam Vitals and nursing note reviewed.  Constitutional:      General: He is not in acute distress.    Appearance: Normal appearance.  HENT:     Head: Normocephalic and atraumatic.  Eyes:     General:        Right eye: No discharge.        Left eye: No discharge.  Cardiovascular:     Rate and Rhythm: Normal rate and regular rhythm.     Heart sounds: No murmur heard.    No friction  rub. No gallop.  Pulmonary:     Effort: Pulmonary effort is normal.     Breath sounds: Normal breath sounds.  Abdominal:     General: Bowel sounds are normal.     Palpations: Abdomen is soft.  Skin:    General: Skin is warm and dry.     Capillary Refill: Capillary refill takes less than 2 seconds.     Comments: Appropriate appearance of suprapubic catheter insertion site in the lower abdomen, no overt purulent drainage surrounding the site.  Catheter in place not draining appropriately.  Neurological:     Mental Status: He is alert and oriented to person, place, and time.  Psychiatric:        Mood and Affect: Mood normal.        Behavior: Behavior normal.     (all labs ordered are listed, but only abnormal results are displayed) Labs Reviewed - No data  to display  EKG: None  Radiology: No results found.   BLADDER CATHETERIZATION  Date/Time: 02/27/2024 2:35 PM  Performed by: Rosan Sherlean DEL, PA-C Authorized by: Rosan Sherlean DEL, PA-C   Consent:    Consent obtained:  Verbal   Consent given by:  Patient   Risks, benefits, and alternatives were discussed: yes     Risks discussed:  False passage, infection, urethral injury, incomplete procedure and pain   Alternatives discussed:  No treatment Universal protocol:    Procedure explained and questions answered to patient or proxy's satisfaction: yes     Patient identity confirmed:  Verbally with patient Pre-procedure details:    Procedure purpose:  Therapeutic Anesthesia:    Anesthesia method:  None Procedure details:    Provider performed due to:  Altered anatomy   Altered anatomy details: suprapubic cath.   Catheter insertion:  Indwelling   Catheter type:  Foley   Catheter size:  16 Fr   Bladder irrigation: no     Number of attempts:  1   Urine characteristics:  Clear Post-procedure details:    Procedure completion:  Tolerated    Medications Ordered in the ED - No data to display                                  Medical Decision Making  This patient is a 74 y.o. male  who presents to the ED for concern of catheter issue.   Differential diagnoses prior to evaluation: The emergent differential diagnosis includes, but is not limited to, need for catheter exchange catheter associated UTI, versus other. This is not an exhaustive differential.   Past Medical History / Co-morbidities / Social History: Obesity, hypertension, hyperlipidemia, chronic indwelling suprapubic catheter  Physical Exam: Physical exam performed. The pertinent findings include: Site of insertion without any evidence of infection, catheter was exchanged without difficulty  Medications: Suprapubic catheter exchange as discussed above, procedure without complication.    Disposition: After consideration of the diagnostic results and the patients response to treatment, I feel that patient is stable for discharge with plan as above, catheter bag draining appropriately after exchange.  Catheter draining appropriately after exchange, patient feeling back to baseline, stable for discharge with urology follow-up as needed  emergency department workup does not suggest an emergent condition requiring admission or immediate intervention beyond what has been performed at this time. The plan is: as above. The patient is safe for discharge and has been instructed to return immediately for worsening symptoms, change  in symptoms or any other concerns.   Final diagnoses:  Obstruction of urinary catheter, initial encounter    ED Discharge Orders     None          Rosan Sherlean DEL, PA-C 02/27/24 1503    Pamella Ozell LABOR, DO 03/06/24 1842  "

## 2024-03-02 ENCOUNTER — Other Ambulatory Visit: Payer: Self-pay | Admitting: Nurse Practitioner

## 2024-03-03 ENCOUNTER — Ambulatory Visit (HOSPITAL_COMMUNITY): Admitting: Physician Assistant

## 2024-03-03 DIAGNOSIS — E785 Hyperlipidemia, unspecified: Secondary | ICD-10-CM | POA: Diagnosis not present

## 2024-03-03 DIAGNOSIS — D509 Iron deficiency anemia, unspecified: Secondary | ICD-10-CM | POA: Diagnosis not present

## 2024-03-03 DIAGNOSIS — Z87440 Personal history of urinary (tract) infections: Secondary | ICD-10-CM | POA: Diagnosis not present

## 2024-03-03 DIAGNOSIS — Z7985 Long-term (current) use of injectable non-insulin antidiabetic drugs: Secondary | ICD-10-CM | POA: Diagnosis not present

## 2024-03-03 DIAGNOSIS — E538 Deficiency of other specified B group vitamins: Secondary | ICD-10-CM | POA: Diagnosis not present

## 2024-03-03 DIAGNOSIS — J9611 Chronic respiratory failure with hypoxia: Secondary | ICD-10-CM | POA: Diagnosis not present

## 2024-03-03 DIAGNOSIS — I272 Pulmonary hypertension, unspecified: Secondary | ICD-10-CM | POA: Diagnosis not present

## 2024-03-03 DIAGNOSIS — E1122 Type 2 diabetes mellitus with diabetic chronic kidney disease: Secondary | ICD-10-CM | POA: Diagnosis not present

## 2024-03-03 DIAGNOSIS — I5023 Acute on chronic systolic (congestive) heart failure: Secondary | ICD-10-CM | POA: Diagnosis not present

## 2024-03-03 DIAGNOSIS — I130 Hypertensive heart and chronic kidney disease with heart failure and stage 1 through stage 4 chronic kidney disease, or unspecified chronic kidney disease: Secondary | ICD-10-CM | POA: Diagnosis not present

## 2024-03-03 DIAGNOSIS — Z8546 Personal history of malignant neoplasm of prostate: Secondary | ICD-10-CM | POA: Diagnosis not present

## 2024-03-03 DIAGNOSIS — Z96651 Presence of right artificial knee joint: Secondary | ICD-10-CM | POA: Diagnosis not present

## 2024-03-07 ENCOUNTER — Ambulatory Visit (HOSPITAL_COMMUNITY)
Admission: RE | Admit: 2024-03-07 | Discharge: 2024-03-07 | Disposition: A | Discharge status: Home / Self Care | Source: Ambulatory Visit | Attending: Physician Assistant | Admitting: Physician Assistant

## 2024-03-07 VITALS — BP 116/82 | HR 79 | Ht 69.0 in | Wt 284.2 lb

## 2024-03-07 DIAGNOSIS — Z5181 Encounter for therapeutic drug level monitoring: Secondary | ICD-10-CM

## 2024-03-07 DIAGNOSIS — D6869 Other thrombophilia: Secondary | ICD-10-CM

## 2024-03-07 DIAGNOSIS — Z79899 Other long term (current) drug therapy: Secondary | ICD-10-CM | POA: Diagnosis not present

## 2024-03-07 DIAGNOSIS — I4819 Other persistent atrial fibrillation: Secondary | ICD-10-CM | POA: Diagnosis not present

## 2024-03-07 NOTE — Progress Notes (Signed)
 "   Primary Care Physician: [name], NP Primary Cardiologist: Stanly DELENA Leavens, MD Electrophysiologist: Eulas FORBES Furbish, MD  Referring Physician: Dr Michele Leonor MALVA Sharron is a 74 y.o. male with a history of CHF, DVT/PE 2018, HTN, DM, CAD, OSA, chronic respiratory failure, prostate cancer, atrial fibrillation who presents for follow up in the [facility].  The patient was admitted 07/02/23 with tachypalpitations. He was diuresed and started on Lopressor  for rate control. He underwent DCCV on 07/27/23. He was admitted again 08/03/23 for acute CHF. Patient was in the process of being discharged but reverted back to atrial fibrillation 08/06/2023. This was in the setting of a UTI. Patient is on Xarelto  for stroke prevention. S/p repeat DCCV on 08/16/23.  He was hospitalized 11/2023 with acute on chronic CHF. He was also noted to have paroxysms of afib and was started on amiodarone .    Patient returns for follow up for atrial fibrillation and amiodarone  monitoring. He remains in SR today and feels well. He denies any interim symptoms of afib. No bleeding issues on anticoagulation.   Today, he  denies symptoms of palpitations, chest pain, shortness of breath, orthopnea, PND, lower extremity edema, dizziness, presyncope, syncope, bleeding, or neurologic sequela. The patient is tolerating medications without difficulties and is otherwise without complaint today.    Atrial Fibrillation Risk Factors:  he does have symptoms or diagnosis of sleep apnea. he does not have a history of rheumatic fever.   Atrial Fibrillation Management history:  Previous antiarrhythmic drugs: amiodarone   Previous cardioversions: 07/27/23, 08/16/23 Previous ablations: none Anticoagulation history: Xarelto , Eliquis   ROS- All systems are reviewed and negative except as per the HPI above.  Past Medical History:  Diagnosis Date   Acute on chronic diastolic CHF (congestive  heart failure) (HCC) 06/01/2017   Arthritis    Cancer (HCC) 2010   Prostate   Chronic kidney disease    Diabetes mellitus    Diabetic neuropathy (HCC)    feet   DVT (deep venous thrombosis) (HCC)    Genetic testing 06/22/2016   Mr. Spagnoli underwent genetic counseling and testing for hereditary cancer syndromes on 05/14/2016. His results were negative for mutations in all 46 genes analyzed by [facility]'s 46-gene Common Hereditary Cancers Panel. Genes analyzed include: APC, ATM, AXIN2, BARD1, BMPR1A, BRCA1, BRCA2, BRIP1, CDH1, CDKN2A, CHEK2, CTNNA1, DICER1, EPCAM, GREM1, HOXB13, KIT, MEN1, MLH1, MSH2, MSH3, MSH6, MUTYH, NB   GERD (gastroesophageal reflux disease)    Hypertension    PE (pulmonary thromboembolism) (HCC)    Pneumonia    Sleep apnea    not wearing CPAP   Suprapubic catheter (HCC) 11/17/2019    Current Outpatient Medications  Medication Sig Dispense Refill   Accu-Chek Softclix Lancets lancets Use to test blood sugar daily 200 each 3   amiodarone  (PACERONE ) 200 MG tablet Take 1 tablet (200 mg total) by mouth daily. 90 tablet 3   apixaban  (ELIQUIS ) 5 MG TABS tablet Take 1 tablet (5 mg total) by mouth 2 (two) times daily. 60 tablet 4   atorvastatin  (LIPITOR) 40 MG tablet TAKE 1 TABLET EVERY DAY 90 tablet 1   Blood Glucose Monitoring Suppl (ACCU-CHEK AVIVA PLUS) w/Device KIT Use to test blood sugar daily. 1 kit 0   brimonidine  (ALPHAGAN ) 0.2 % ophthalmic solution Place 1 drop into the left eye 2 (two) times daily. 5 mL 4   glucose blood (ACCU-CHEK AVIVA PLUS) test strip Use to test blood sugar daily. 100 each 3  isosorbide  mononitrate (IMDUR ) 30 MG 24 hr tablet Take 1 tablet (30 mg total) by mouth daily. 90 tablet 3   metoprolol  succinate (TOPROL -XL) 50 MG 24 hr tablet Take 1 tablet (50 mg total) by mouth daily. Take with or immediately following a meal. 90 tablet 3   OXYGEN  Inhale 3 L into the lungs continuous.     potassium chloride  SA (KLOR-CON  M) 20 MEQ tablet TAKE 1 TABLET  TWICE DAILY 180 tablet 3   Senna 8.7 MG CHEW Chew 1 tablet by mouth as needed. 90 tablet 1   tirzepatide  (MOUNJARO ) 15 MG/0.5ML Pen Inject 15 mg into the skin once a week. 2 mL 5   torsemide  (DEMADEX ) 20 MG tablet Take 1 tablet (20 mg) by mouth in the morning and 1 tablet (20 mg) in the early afternoon. 180 tablet 3   No current facility-administered medications for this encounter.    Physical Exam: BP 116/82   Pulse 79   Ht 5' 9 (1.753 m)   Wt 128.9 kg   BMI 41.97 kg/m   GEN: Well nourished, well developed in no acute distress CARDIAC: Regular rate and rhythm, no murmurs, rubs, gallops RESPIRATORY:  Clear to auscultation without rales, wheezing or rhonchi. Nasal canula O2  ABDOMEN: Soft, non-tender, non-distended EXTREMITIES:  No edema; No deformity    Wt Readings from Last 3 Encounters:  03/07/24 128.9 kg  02/08/24 123.8 kg  02/03/24 124.1 kg     EKG Interpretation Date/Time:  Tuesday March 07 2024 09:19:05 EST Ventricular Rate:  79 PR Interval:  180 QRS Duration:  116 QT Interval:  448 QTC Calculation: 513 R Axis:   -3  Text Interpretation: Normal sinus rhythm Nonspecific ST and T wave abnormality Prolonged QT Abnormal ECG When compared with ECG of 27-Dec-2023 10:36, No significant change was found Confirmed by Sundance Moise ([id number]) on 03/07/2024 9:33:12 AM    Echo 12/15/23 demonstrated   1. Left ventricular ejection fraction, by estimation, is 40 to 45%. The  left ventricle has mildly decreased function. Left ventricular endocardial  border not optimally defined to evaluate regional wall motion. There is  moderate left ventricular hypertrophy. Left ventricular diastolic parameters are consistent with Grade I diastolic dysfunction (impaired relaxation).   2. Right ventricular systolic function was not well visualized. The right  ventricular size is not well visualized. There is moderately elevated  pulmonary artery systolic pressure. The estimated right ventricular   systolic pressure is 57.0 mmHg.   3. Left atrial size was mildly dilated.   4. The mitral valve is abnormal. Trivial mitral valve regurgitation.   5. The tricuspid valve is abnormal.   6. The aortic valve is tricuspid. Aortic valve regurgitation is not  visualized. Aortic valve sclerosis/calcification is present, without any  evidence of aortic stenosis.   7. The inferior vena cava is dilated in size with <50% respiratory  variability, suggesting right atrial pressure of 15 mmHg.   Comparison(s): No significant change from prior study. 07/04/2023: LVEF  40-45%.    CHA2DS2-VASc Score = 5  The patient's score is based upon: CHF History: 1 HTN History: 1 Diabetes History: 1 Stroke History: 0 Vascular Disease History: 1 Age Score: 1 Gender Score: 0       ASSESSMENT AND PLAN: Persistent Atrial Fibrillation (ICD10:  I48.19) The patient's CHA2DS2-VASc score is 5, indicating a 7.2% annual risk of stroke.   Patient appears to be maintaining SR Continue amiodarone  200 mg daily. Amiodarone  best AAD option despite lung disease.  Continue Toprol   50 mg daily Continue Eliquis  5 mg BID  Secondary Hypercoagulable State (ICD10:  D68.69) The patient is at significant risk for stroke/thromboembolism based upon his CHA2DS2-VASc Score of 5.  Continue Apixaban  (Eliquis ). No bleeding issues.   High Risk Medication Monitoring (ICD 10: [id number]) Patient requires ongoing monitoring for anti-arrhythmic medication which has the potential to cause life threatening arrhythmias. Intervals on ECG acceptable for amiodarone  monitoring. Recent cmet and TSH reviewed. Will recheck at next AF clinic visit.   HTN Stable on current regimen  OSA/OHS Encouraged nightly CPAP On Mounjaro   HFmrEF EF 40-45% GDMT per primary cardiology team Fluid status appears stable today    Follow up in the AF clinic in 6 months.    [facility] [address] [place], [place]  [address] [phone] "

## 2024-03-09 NOTE — Progress Notes (Unsigned)
 " Cardiology Office Note:    Date:  03/09/2024   ID:  Thomas [name], DOB 1950-12-18, MRN [id number]  PCP:  [name], NP   [facility] Providers Cardiologist:  Stanly DELENA Leavens, MD Cardiology APP:  Lelon Glendia DASEN, PA-C  Electrophysiologist:  Eulas FORBES Furbish, MD { Click to update primary MD,subspecialty MD or APP then REFRESH:1}    Referring MD: [name], NP   Chief complaint: 54-month follow-up     History of Present Illness:   Thomas [name] is a 74 y.o. male with a hx of paroxysmal atrial fibrillation, HFmrEF, DVT and PE 2018 on OAC, hypertension, DM2, nonobstructive CAD, OSA, prostate cancer and urinary retention with chronic suprapubic catheter presenting to the office today for 69-month follow-up of HFrEF and persistent atrial fibrillation.   Previously seen in 2019 for atrial fibrillation.  Admitted to the hospital May 2025 for A-fib with RVR.  EF 40-45%, moderately reduced RV function on echo.  Patient was not compliant with Xarelto  at home prior to hospitalization, decision was made to continue rate control and repeat cardioversion on 07/27/2023.  Readmitted to the hospital on 08/03/2023 with worsening shortness of breath.  Found to be volume overloaded with O2 sat 80s on RA.  Admitted for acute on chronic respiratory failure secondary to volume overload, treated with IV Lasix , output 8 L.  Reverted back to atrial fibrillation on the day of discharge with well-controlled heart rate, was 301 lbs At that time.  Felt not to be a candidate for ablation due to chronic O2 use, amiodarone  found to be best option for rhythm control, however with significant baseline COPD patient did not wish to undergo medication that could affect his lung function.  Had another successful cardioversion on 08/16/2023.    He was admitted 12/13/2023 for shortness of breath following CHF exacerbation.  BNP 829.  Patient was aggressively diuresed, -10 L at discharge.   Creatinine from 1.5/1.7 ? 2.86.  Echo 12/15/2023: LVEF 40-45%, moderate LVH, G1 DD, moderately elevated PASP, RVSP 57 mmHg.  No significant valvular disease.  Discontinued aspirin , hydralazine , losartan , metformin , metoprolol  tartrate, spironolactone , Xarelto  at this hospitalization.  Discharged on amiodarone , apixaban , atorvastatin , Imdur , metoprolol  succinate, Mounjaro , torsemide .  Presented to the ED 02/27/2024 with complaints of urinary retention.  Suprapubic urinary catheter replaced, urine draining normally, patient felt back to baseline.  Discharged home in stable condition.  Seen for follow-up in A-fib clinic 03/07/2024, where he remained in sinus rhythm  HFmrEF Echo 12/15/2023: LVEF 40-45%, moderate LVH, G1 DD, moderately elevated PASP, RVSP 57 mmHg.  Symptoms Volume Weight Functional status Continue Toprol -XL 50 mg daily, continue torsemide  20 mg twice daily  CAD R/LHC 06/2017: Nonobstructive CAD, moderate to severe pulmonary hypertension Asymptomatic of ischemic symptoms today No ASA 2/2 Eliquis  use Continue Imdur  30 mg daily, metoprolol  succinate 50 mg daily, atorvastatin  40 mg daily  CKD, 3b Most recent creatinine 1.93, GFR 36 Will repeat BMP today, if renal function worsening will place consult nephrology  PAF EKG 3 days prior at A-fib clinic visit demonstrated sinus rhythm Patient unaware of any further episodes of A-fib TSH to be checked at follow-up of A-fib clinic visit.  HTN BPs reported well-controlled at home Continue Imdur  30 mg daily, Toprol -XL 50 mg daily, torsemide  20 mg twice daily  Chronic thromboembolic pulmonary hypertension Stable dyspnea on exertion, unchanged from baseline Followed by Dr. Shellia of [facility] pulmonology service.  On chronic 4 L/min nasal cannula  ROS:   Please see  the history of present illness.    *** All other systems reviewed and are negative.     Past Medical History:  Diagnosis Date   Acute on chronic diastolic CHF  (congestive heart failure) (HCC) 06/01/2017   Arthritis    Cancer (HCC) 2010   Prostate   Chronic kidney disease    Diabetes mellitus    Diabetic neuropathy (HCC)    feet   DVT (deep venous thrombosis) (HCC)    Genetic testing 06/22/2016   Mr. [name] underwent genetic counseling and testing for hereditary cancer syndromes on 05/14/2016. His results were negative for mutations in all 46 genes analyzed by [facility]'s 46-gene Common Hereditary Cancers Panel. Genes analyzed include: APC, ATM, AXIN2, BARD1, BMPR1A, BRCA1, BRCA2, BRIP1, CDH1, CDKN2A, CHEK2, CTNNA1, DICER1, EPCAM, GREM1, HOXB13, KIT, MEN1, MLH1, MSH2, MSH3, MSH6, MUTYH, NB   GERD (gastroesophageal reflux disease)    Hypertension    PE (pulmonary thromboembolism) (HCC)    Pneumonia    Sleep apnea    not wearing CPAP   Suprapubic catheter (HCC) 11/17/2019    Past Surgical History:  Procedure Laterality Date   CARDIOVERSION N/A 06/03/2017   Procedure: CARDIOVERSION;  Surgeon: Jeffrie Oneil BROCKS, MD;  Location: [facility];  Service: Cardiovascular;  Laterality: N/A;   CARDIOVERSION N/A 07/27/2023   Procedure: CARDIOVERSION;  Surgeon: Delford Maude BROCKS, MD;  Location: [facility] INVASIVE CV LAB;  Service: Cardiovascular;  Laterality: N/A;   CARDIOVERSION N/A 08/16/2023   Procedure: CARDIOVERSION;  Surgeon: Santo Stanly LABOR, MD;  Location: [facility] INVASIVE CV LAB;  Service: Cardiovascular;  Laterality: N/A;   CATARACT EXTRACTION Left 07/31/2021   Dr.Glenn, [facility]   COLONOSCOPY     COLONOSCOPY WITH PROPOFOL  N/A 10/20/2018   Procedure: COLONOSCOPY WITH PROPOFOL ;  Surgeon: Eda Iha, MD;  Location: [facility];  Service: Gastroenterology;  Laterality: N/A;   HERNIA REPAIR     KNEE SURGERY     MULTIPLE TOOTH EXTRACTIONS     POLYPECTOMY  10/20/2018   Procedure: POLYPECTOMY;  Surgeon: Eda Iha, MD;  Location: [facility];  Service: Gastroenterology;;   PROSTATE SURGERY     RADIOACTIVE SEED IMPLANT      RIGHT/LEFT HEART CATH AND CORONARY ANGIOGRAPHY N/A 07/06/2017   Procedure: RIGHT/LEFT HEART CATH AND CORONARY ANGIOGRAPHY;  Surgeon: Dann Candyce RAMAN, MD;  Location: [facility] INVASIVE CV LAB;  Service: Cardiovascular;  Laterality: N/A;   SHOULDER SURGERY     TOTAL KNEE ARTHROPLASTY Right 11/19/2016   Procedure: RIGHT TOTAL KNEE ARTHROPLASTY;  Surgeon: Jerri Kay HERO, MD;  Location: [facility] OR;  Service: Orthopedics;  Laterality: Right;   uretha surgery-2014      Current Medications: Active Medications[1]   Allergies:   Lisinopril    Social History   Socioeconomic History   Marital status: Married    Spouse name: Heron   Number of children: 5   Years of education: Not on file   Highest education level: 12th grade  Occupational History   Occupation: Retired  Tobacco Use   Smoking status: Never    Passive exposure: Never   Smokeless tobacco: Never   Tobacco comments:    Never smoked 09/03/23  Vaping Use   Vaping status: Never Used  Substance and Sexual Activity   Alcohol  use: No    Comment: never   Drug use: No   Sexual activity: Yes  Other Topics Concern   Not on file  Social History Narrative   Not on file   Social Drivers of Health   Tobacco  Use: Low Risk (02/07/2024)   Patient History    Smoking Tobacco Use: Never    Smokeless Tobacco Use: Never    Passive Exposure: Never  Financial Resource Strain: Low Risk (12/16/2023)   Overall Financial Resource Strain (CARDIA)    Difficulty of Paying Living Expenses: Not hard at all  Food Insecurity: No Food Insecurity (12/24/2023)   Epic    Worried About Programme Researcher, Broadcasting/film/video in the Last Year: Never true    Ran Out of Food in the Last Year: Never true  Transportation Needs: No Transportation Needs (12/24/2023)   Epic    Lack of Transportation (Medical): No    Lack of Transportation (Non-Medical): No  Physical Activity: Not on file  Stress: Not on file  Social Connections: Unknown (12/14/2023)   Social Connection and Isolation  Panel    Frequency of Communication with Friends and Family: More than three times a week    Frequency of Social Gatherings with Friends and Family: More than three times a week    Attends Religious Services: Not on Marketing Executive or Organizations: No    Attends Banker Meetings: Never    Marital Status: Married  Recent Concern: Social Connections - Moderately Isolated (12/14/2023)   Social Connection and Isolation Panel    Frequency of Communication with Friends and Family: More than three times a week    Frequency of Social Gatherings with Friends and Family: More than three times a week    Attends Religious Services: Never    Database Administrator or Organizations: Not on file    Attends Banker Meetings: Never    Marital Status: Married  Depression (PHQ2-9): Low Risk (01/07/2024)   Depression (PHQ2-9)    PHQ-2 Score: 0  Alcohol  Screen: Low Risk (12/16/2023)   Alcohol  Screen    Last Alcohol  Screening Score (AUDIT): 0  Housing: Low Risk (12/24/2023)   Epic    Unable to Pay for Housing in the Last Year: No    Number of Times Moved in the Last Year: 0    Homeless in the Last Year: No  Utilities: Not At Risk (12/24/2023)   Epic    Threatened with loss of utilities: No  Health Literacy: Not on file     Family History: The patient's ***family history includes Breast cancer (age of onset: 17) in his sister; Breast cancer (age of onset: 10) in his maternal aunt; Breast cancer (age of onset: 44) in his mother; Cancer (age of onset: 20) in his sister; Cancer (age of onset: 53) in his maternal uncle; Cervical cancer (age of onset: 34) in an other family member; Leukemia (age of onset: 20) in his brother; Lung cancer in his maternal uncle; Other (age of onset: 54) in his brother; Prostate cancer in his brother and paternal uncle.  EKGs/Labs/Other Studies Reviewed:    The following studies were reviewed today: ***      Recent Labs: 08/06/2023:  Magnesium  2.2 12/17/2023: Hemoglobin 11.8; Platelets 238 12/27/2023: ALT 51; B Natriuretic Peptide 610.6; BUN 35; Creatinine, Ser 1.93; Potassium 3.3; Sodium 144; TSH 4.034  Recent Lipid Panel    Component Value Date/Time   CHOL 89 (L) 11/25/2023 0936   TRIG 53 11/25/2023 0936   HDL 40 11/25/2023 0936   CHOLHDL 2.2 11/25/2023 0936   CHOLHDL 2.2 08/19/2023 0934   VLDL 19 06/01/2017 0236   LDLCALC 36 11/25/2023 0936   LDLCALC 33 08/19/2023 0934  Risk Assessment/Calculations:   {Does this patient have ATRIAL FIBRILLATION?[phone]}  No BP recorded.  {Refresh Note OR Click here to enter BP  :1}***         Physical Exam:    VS:  There were no vitals taken for this visit.       Wt Readings from Last 3 Encounters:  03/07/24 284 lb 3.2 oz (128.9 kg)  02/08/24 273 lb (123.8 kg)  02/03/24 273 lb 8 oz (124.1 kg)     GEN: *** Well nourished, well developed in no acute distress HEENT: Normal NECK: No JVD; No carotid bruits CARDIAC: *** S1-S2 normal, RRR, no murmurs, rubs, gallops RESPIRATORY:  Clear to auscultation without rales, wheezing or rhonchi  MUSCULOSKELETAL:  No edema; No deformity  SKIN: Warm and dry NEUROLOGIC:  Alert and oriented x 3 PSYCHIATRIC:  Normal affect       Assessment & Plan   Disposition: *** Route to primary cardiologist      {Are you ordering a CV Procedure (e.g. stress test, cath, DCCV, TEE, etc)?   Press F2        :[id number]}    Medication Adjustments/Labs and Tests Ordered: Current medicines are reviewed at length with the patient today.  Concerns regarding medicines are outlined above.  No orders of the defined types were placed in this encounter.  No orders of the defined types were placed in this encounter.   There are no Patient Instructions on file for this visit.   Signed, Kimmie Doren E Ayce Pietrzyk, NP  03/09/2024 3:46 PM    [facility]     [1]  No outpatient medications have been marked as taking for the 03/10/24  encounter (Appointment) with Merle Whitehorn E, NP.   "

## 2024-03-10 ENCOUNTER — Ambulatory Visit: Admitting: Emergency Medicine

## 2024-03-13 ENCOUNTER — Other Ambulatory Visit: Payer: Self-pay

## 2024-03-13 NOTE — Patient Instructions (Signed)
 Visit Information  Thank you for taking time to visit with me today. Please don't hesitate to contact me if I can be of assistance to you before our next scheduled appointment.  Your next care management appointment is by telephone on Monday, February 16 at 09:30 AM  Please call the care guide team at [phone] if you need to cancel, schedule, or reschedule an appointment.   Please call [phone]-TALK (toll free, 24 hour hotline) if you are experiencing a Mental Health or Behavioral Health Crisis or need someone to talk to.  Clayborne Ly RN BSN CCM [place]  [facility], [facility] Nurse Care Coordinator  Direct Dial: [phone] Website: [email or web address]

## 2024-03-13 NOTE — Patient Outreach (Signed)
 Complex Care Management   Visit Note  03/13/2024  Name:  Thomas [name] MRN: [id number] DOB: 04/11/1950  Situation: Referral received for Complex Care Management related to CHF, Pulmonary Hypertension, OSA w/CPAP, PAF (paroxysmal atrial fibrillation), Urinary Retention with Suprapubic Catheter. I obtained verbal consent from Patient.  Visit completed with Patient on the phone.  Background:   Past Medical History:  Diagnosis Date   Acute on chronic diastolic CHF (congestive heart failure) (HCC) 06/01/2017   Arthritis    Cancer (HCC) 2010   Prostate   Chronic kidney disease    Diabetes mellitus    Diabetic neuropathy (HCC)    feet   DVT (deep venous thrombosis) (HCC)    Genetic testing 06/22/2016   Thomas [name] underwent genetic counseling and testing for hereditary cancer syndromes on 05/14/2016. His results were negative for mutations in all 46 genes analyzed by [facility]'s 46-gene Common Hereditary Cancers Panel. Genes analyzed include: APC, ATM, AXIN2, BARD1, BMPR1A, BRCA1, BRCA2, BRIP1, CDH1, CDKN2A, CHEK2, CTNNA1, DICER1, EPCAM, GREM1, HOXB13, KIT, MEN1, MLH1, MSH2, MSH3, MSH6, MUTYH, NB   GERD (gastroesophageal reflux disease)    Hypertension    PE (pulmonary thromboembolism) (HCC)    Pneumonia    Sleep apnea    not wearing CPAP   Suprapubic catheter (HCC) 11/17/2019    Assessment: Patient Reported Symptoms:  Cognitive Cognitive Status: Able to follow simple commands, Normal speech and language skills Cognitive/Intellectual Conditions Management [RPT]: None reported or documented in medical history or problem list   Health Maintenance Behaviors: Annual physical exam, Healthy diet Health Facilitated by: Healthy diet, Rest  Neurological Neurological Review of Symptoms: No symptoms reported    HEENT HEENT Symptoms Reported: No symptoms reported      Cardiovascular Cardiovascular Symptoms Reported: No symptoms reported Does patient have uncontrolled Hypertension?:  No Cardiovascular Management Strategies: Medication therapy, Routine screening, Diet modification, Fluid modification Weight: 271 lb (122.9 kg) Cardiovascular Self-Management Outcome: 4 (good)  Respiratory Respiratory Symptoms Reported: Shortness of breath Respiratory Management Strategies: Adequate rest, Oxygen  therapy, Medication therapy, CPAP Respiratory Self-Management Outcome: 4 (good)  Endocrine Endocrine Symptoms Reported: No symptoms reported Is patient diabetic?: Yes Is patient checking blood sugars at home?: Yes Endocrine Self-Management Outcome: 4 (good) Endocrine Comment: BS not available for today but patient states his sugars have been running within range  Gastrointestinal Gastrointestinal Symptoms Reported: Constipation Gastrointestinal Management Strategies: Medication therapy Gastrointestinal Self-Management Outcome: 4 (good)    Genitourinary Genitourinary Symptoms Reported: Other Other Genitourinary Symptoms: bladder spasms, recent catheter change in ED for issues with flow, scheduled with urologist for next suprapubic catheter change on 03/16/24; placed outbound call to Urology re: ongoing spasms Genitourinary Management Strategies: Fluid modification, Catheter, indwelling (Indwelling Suprapubic Catheter) Indwelling Catheter Inserted: 02/27/24 Genitourinary Self-Management Outcome: 4 (good)  Integumentary Integumentary Symptoms Reported: No symptoms reported    Musculoskeletal Musculoskelatal Symptoms Reviewed: Difficulty walking, Limited mobility, Unsteady gait, Weakness Additional Musculoskeletal Details: patient W/C bound but can transfer independently Musculoskeletal Management Strategies: Routine screening, Medical device, Adequate rest Musculoskeletal Self-Management Outcome: 4 (good) Falls in the past year?: No Number of falls in past year: 1 or less Was there an injury with Fall?: No Fall Risk Category Calculator: 0 Patient Fall Risk Level: Low Fall  Risk Patient at Risk for Falls Due to: Impaired balance/gait, Impaired mobility Fall risk Follow up: Education provided, Falls evaluation completed  Psychosocial Psychosocial Symptoms Reported: No symptoms reported   Major Change/Loss/Stressor/Fears (CP): Denies      03/13/2024    PHQ2-9 Depression Screening  Thomas [name] interest or pleasure in doing things    Feeling down, depressed, or hopeless    PHQ-2 - Total Score    Trouble falling or staying asleep, or sleeping too much    Feeling tired or having Thomas [name] energy    Poor appetite or overeating     Feeling bad about yourself - or that you are a failure or have let yourself or your family down    Trouble concentrating on things, such as reading the newspaper or watching television    Moving or speaking so slowly that other people could have noticed.  Or the opposite - being so fidgety or restless that you have been moving around a lot more than usual    Thoughts that you would be better off dead, or hurting yourself in some way    PHQ2-9 Total Score    If you checked off any problems, how difficult have these problems made it for you to do your work, take care of things at home, or get along with other people    Depression Interventions/Treatment      Today's Vitals   03/13/24 0949  BP: 116/82  Pulse: 79  Weight: 271 lb (122.9 kg)   Pain Scale: Not given for pain  Medications Reviewed Today     Reviewed by Thomas Clayborne CROME, RN (Registered Nurse) on 03/13/24 at [phone]  Med List Status: <None>   Medication Order Taking? Sig Documenting Provider Last Dose Status Informant  Accu-Chek Softclix Lancets lancets [id number]  Use to test blood sugar daily Thomas Harlene POUR, NP  Active   amiodarone  (PACERONE ) 200 MG tablet [id number]  Take 1 tablet (200 mg total) by mouth daily. Campbell, Kenzie E, NP  Active   apixaban  (ELIQUIS ) 5 MG TABS tablet [id number]  Take 1 tablet (5 mg total) by mouth 2 (two) times daily. Gil Greig BRAVO, NP  Active    atorvastatin  (LIPITOR) 40 MG tablet [id number]  TAKE 1 TABLET EVERY DAY Thomas Jessica K, NP  Active   Blood Glucose Monitoring Suppl (ACCU-CHEK AVIVA PLUS) w/Device KIT [id number]  Use to test blood sugar daily. Thomas Harlene POUR, NP  Active   brimonidine  (ALPHAGAN ) 0.2 % ophthalmic solution [id number]  Place 1 drop into the left eye 2 (two) times daily.   Active Self, Pharmacy Records  glucose blood (ACCU-CHEK AVIVA PLUS) test strip [id number]  Use to test blood sugar daily. Thomas Harlene POUR, NP  Active   isosorbide  mononitrate (IMDUR ) 30 MG 24 hr tablet [id number]  Take 1 tablet (30 mg total) by mouth daily. Thomas Harlene POUR, NP  Active Self, Pharmacy Records  metoprolol  succinate (TOPROL -XL) 50 MG 24 hr tablet [id number]  Take 1 tablet (50 mg total) by mouth daily. Take with or immediately following a meal. Lee, Jordan, NP  Active   OXYGEN  [id number]  Inhale 3 L into the lungs continuous. [provider]  Active   potassium chloride  SA (KLOR-CON  M) 20 MEQ tablet [id number]  TAKE 1 TABLET TWICE DAILY Thomas, Jessica K, NP  Active Self, Pharmacy Records           Med Note (S[place]TER[place], TEENA BRAVO   Tue Aug 03, 2023  5:39 PM)    Senna 8.7 MG CHEW [id number]  Chew 1 tablet by mouth as needed. Thomas Harlene POUR, NP  Active Self, Pharmacy Records  tirzepatide  (MOUNJARO ) 15 MG/0.5ML Pen [id number]  Inject 15 mg into the skin once a week. Santo Stanly LABOR, MD  Active   torsemide  (DEMADEX )  20 MG tablet [id number]  Take 1 tablet (20 mg) by mouth in the morning and 1 tablet (20 mg) in the early afternoon. Campbell, Kenzie E, NP  Active Self, Pharmacy Records            Recommendation:   Specialty provider follow-up  03/16/2024 1:00 PM EST Clinical Support [facility]  [address]  [place], [place] [address]  [phone]     04/18/2024 Status: Sch   Time: 8:20 AM Length: 20  Visit Type: OFFICE VISIT [1004] Copay: $15.00  Provider:  Santo Stanly LABOR, MD Department: [facility]   Follow Up Plan:   Telephone follow up appointment date/time   04/03/2024 Status: Sch   Time: 9:30 AM Length: 30  Visit Type: VBCI TELEPHONE CALL 30 [2502] Copay: $0.00  Provider: Morgan Clayborne CROME, RN Department: [facility]   [name] RN BSN CCM [place]  [facility], [facility] Nurse Care Coordinator  Direct Dial: [phone] Website: [email or web address]

## 2024-03-16 ENCOUNTER — Other Ambulatory Visit: Payer: Self-pay

## 2024-03-19 ENCOUNTER — Other Ambulatory Visit (HOSPITAL_COMMUNITY): Payer: Self-pay

## 2024-03-21 ENCOUNTER — Other Ambulatory Visit (HOSPITAL_COMMUNITY): Payer: Self-pay

## 2024-04-03 ENCOUNTER — Telehealth

## 2024-04-18 ENCOUNTER — Ambulatory Visit: Admitting: Internal Medicine

## 2024-09-05 ENCOUNTER — Ambulatory Visit (HOSPITAL_COMMUNITY): Admitting: Physician Assistant

## 2024-11-16 ENCOUNTER — Ambulatory Visit: Payer: Self-pay | Admitting: Nurse Practitioner
